# Patient Record
Sex: Female | Born: 1960 | Race: White | Hispanic: No | Smoking: Current every day smoker
Health system: Southern US, Community
[De-identification: ages and names within clinical notes are randomized; demographics above are authoritative.]

## PROBLEM LIST (undated history)

## (undated) DIAGNOSIS — G709 Myoneural disorder, unspecified: Secondary | ICD-10-CM

## (undated) DIAGNOSIS — M1990 Unspecified osteoarthritis, unspecified site: Secondary | ICD-10-CM

## (undated) DIAGNOSIS — J189 Pneumonia, unspecified organism: Secondary | ICD-10-CM

## (undated) DIAGNOSIS — E039 Hypothyroidism, unspecified: Secondary | ICD-10-CM

## (undated) DIAGNOSIS — Z720 Tobacco use: Secondary | ICD-10-CM

## (undated) DIAGNOSIS — G629 Polyneuropathy, unspecified: Secondary | ICD-10-CM

## (undated) DIAGNOSIS — G510 Bell's palsy: Secondary | ICD-10-CM

## (undated) DIAGNOSIS — E785 Hyperlipidemia, unspecified: Secondary | ICD-10-CM

## (undated) DIAGNOSIS — I499 Cardiac arrhythmia, unspecified: Secondary | ICD-10-CM

## (undated) DIAGNOSIS — E669 Obesity, unspecified: Secondary | ICD-10-CM

## (undated) DIAGNOSIS — R232 Flushing: Secondary | ICD-10-CM

## (undated) DIAGNOSIS — Z95810 Presence of automatic (implantable) cardiac defibrillator: Secondary | ICD-10-CM

## (undated) DIAGNOSIS — K219 Gastro-esophageal reflux disease without esophagitis: Secondary | ICD-10-CM

## (undated) DIAGNOSIS — R0602 Shortness of breath: Secondary | ICD-10-CM

## (undated) DIAGNOSIS — I5022 Chronic systolic (congestive) heart failure: Secondary | ICD-10-CM

## (undated) DIAGNOSIS — I639 Cerebral infarction, unspecified: Secondary | ICD-10-CM

## (undated) DIAGNOSIS — I509 Heart failure, unspecified: Secondary | ICD-10-CM

## (undated) DIAGNOSIS — F419 Anxiety disorder, unspecified: Secondary | ICD-10-CM

## (undated) DIAGNOSIS — R51 Headache: Secondary | ICD-10-CM

## (undated) DIAGNOSIS — J449 Chronic obstructive pulmonary disease, unspecified: Secondary | ICD-10-CM

## (undated) DIAGNOSIS — I10 Essential (primary) hypertension: Secondary | ICD-10-CM

## (undated) DIAGNOSIS — R569 Unspecified convulsions: Secondary | ICD-10-CM

## (undated) HISTORY — DX: Chronic systolic (congestive) heart failure: I50.22

## (undated) HISTORY — DX: Flushing: R23.2

## (undated) HISTORY — DX: Gastro-esophageal reflux disease without esophagitis: K21.9

## (undated) HISTORY — PX: TUBAL LIGATION: SHX77

## (undated) HISTORY — PX: EP IMPLANTABLE DEVICE: SHX172B

## (undated) HISTORY — DX: Obesity, unspecified: E66.9

## (undated) HISTORY — DX: Hyperlipidemia, unspecified: E78.5

## (undated) HISTORY — DX: Tobacco use: Z72.0

---

## 1997-12-12 ENCOUNTER — Emergency Department (HOSPITAL_COMMUNITY): Admission: EM | Admit: 1997-12-12 | Discharge: 1997-12-12 | Payer: Self-pay | Admitting: Emergency Medicine

## 1998-02-27 ENCOUNTER — Ambulatory Visit (HOSPITAL_COMMUNITY): Admission: RE | Admit: 1998-02-27 | Discharge: 1998-02-27 | Payer: Self-pay | Admitting: Cardiovascular Disease

## 1998-03-16 ENCOUNTER — Emergency Department (HOSPITAL_COMMUNITY): Admission: EM | Admit: 1998-03-16 | Discharge: 1998-03-16 | Payer: Self-pay | Admitting: Emergency Medicine

## 1998-03-20 ENCOUNTER — Ambulatory Visit (HOSPITAL_COMMUNITY): Admission: RE | Admit: 1998-03-20 | Discharge: 1998-03-20 | Payer: Self-pay | Admitting: Cardiovascular Disease

## 1998-08-30 ENCOUNTER — Emergency Department (HOSPITAL_COMMUNITY): Admission: EM | Admit: 1998-08-30 | Discharge: 1998-08-30 | Payer: Self-pay | Admitting: *Deleted

## 1999-04-02 ENCOUNTER — Emergency Department (HOSPITAL_COMMUNITY): Admission: EM | Admit: 1999-04-02 | Discharge: 1999-04-02 | Payer: Self-pay | Admitting: Emergency Medicine

## 1999-07-30 ENCOUNTER — Emergency Department (HOSPITAL_COMMUNITY): Admission: EM | Admit: 1999-07-30 | Discharge: 1999-07-30 | Payer: Self-pay | Admitting: Emergency Medicine

## 1999-11-04 ENCOUNTER — Emergency Department (HOSPITAL_COMMUNITY): Admission: EM | Admit: 1999-11-04 | Discharge: 1999-11-04 | Payer: Self-pay | Admitting: Emergency Medicine

## 2000-05-23 ENCOUNTER — Emergency Department (HOSPITAL_COMMUNITY): Admission: EM | Admit: 2000-05-23 | Discharge: 2000-05-24 | Payer: Self-pay | Admitting: Emergency Medicine

## 2000-05-23 ENCOUNTER — Encounter: Payer: Self-pay | Admitting: *Deleted

## 2000-06-28 ENCOUNTER — Inpatient Hospital Stay (HOSPITAL_COMMUNITY): Admission: AD | Admit: 2000-06-28 | Discharge: 2000-06-28 | Payer: Self-pay | Admitting: *Deleted

## 2000-08-23 ENCOUNTER — Encounter: Payer: Self-pay | Admitting: Gastroenterology

## 2000-08-23 ENCOUNTER — Encounter: Admission: RE | Admit: 2000-08-23 | Discharge: 2000-08-23 | Payer: Self-pay | Admitting: Gastroenterology

## 2000-11-09 ENCOUNTER — Encounter: Payer: Self-pay | Admitting: Family Medicine

## 2000-11-09 ENCOUNTER — Ambulatory Visit (HOSPITAL_COMMUNITY): Admission: RE | Admit: 2000-11-09 | Discharge: 2000-11-09 | Payer: Self-pay | Admitting: Family Medicine

## 2001-05-04 ENCOUNTER — Ambulatory Visit (HOSPITAL_COMMUNITY): Admission: RE | Admit: 2001-05-04 | Discharge: 2001-05-04 | Payer: Self-pay | Admitting: Internal Medicine

## 2001-05-16 ENCOUNTER — Emergency Department (HOSPITAL_COMMUNITY): Admission: EM | Admit: 2001-05-16 | Discharge: 2001-05-16 | Payer: Self-pay | Admitting: Emergency Medicine

## 2001-05-16 ENCOUNTER — Encounter: Payer: Self-pay | Admitting: Emergency Medicine

## 2001-06-05 ENCOUNTER — Ambulatory Visit (HOSPITAL_COMMUNITY): Admission: RE | Admit: 2001-06-05 | Discharge: 2001-06-05 | Payer: Self-pay | Admitting: *Deleted

## 2001-06-05 ENCOUNTER — Encounter: Payer: Self-pay | Admitting: *Deleted

## 2001-07-07 ENCOUNTER — Emergency Department (HOSPITAL_COMMUNITY): Admission: EM | Admit: 2001-07-07 | Discharge: 2001-07-08 | Payer: Self-pay | Admitting: *Deleted

## 2001-07-20 ENCOUNTER — Ambulatory Visit (HOSPITAL_COMMUNITY): Admission: RE | Admit: 2001-07-20 | Discharge: 2001-07-20 | Payer: Self-pay | Admitting: Internal Medicine

## 2001-07-20 ENCOUNTER — Encounter: Payer: Self-pay | Admitting: Internal Medicine

## 2002-01-14 ENCOUNTER — Encounter: Payer: Self-pay | Admitting: Internal Medicine

## 2002-01-14 ENCOUNTER — Ambulatory Visit (HOSPITAL_COMMUNITY): Admission: RE | Admit: 2002-01-14 | Discharge: 2002-01-14 | Payer: Self-pay | Admitting: Internal Medicine

## 2002-08-09 ENCOUNTER — Encounter: Payer: Self-pay | Admitting: Internal Medicine

## 2002-08-09 ENCOUNTER — Ambulatory Visit (HOSPITAL_COMMUNITY): Admission: RE | Admit: 2002-08-09 | Discharge: 2002-08-09 | Payer: Self-pay | Admitting: Internal Medicine

## 2003-05-02 ENCOUNTER — Ambulatory Visit (HOSPITAL_COMMUNITY): Admission: RE | Admit: 2003-05-02 | Discharge: 2003-05-02 | Payer: Self-pay | Admitting: Family Medicine

## 2004-01-08 ENCOUNTER — Emergency Department (HOSPITAL_COMMUNITY): Admission: EM | Admit: 2004-01-08 | Discharge: 2004-01-08 | Payer: Self-pay | Admitting: Emergency Medicine

## 2004-01-20 ENCOUNTER — Encounter (HOSPITAL_COMMUNITY): Admission: RE | Admit: 2004-01-20 | Discharge: 2004-01-21 | Payer: Self-pay | Admitting: Internal Medicine

## 2004-06-13 ENCOUNTER — Emergency Department (HOSPITAL_COMMUNITY): Admission: EM | Admit: 2004-06-13 | Discharge: 2004-06-13 | Payer: Self-pay | Admitting: Emergency Medicine

## 2004-10-20 ENCOUNTER — Ambulatory Visit (HOSPITAL_COMMUNITY): Admission: RE | Admit: 2004-10-20 | Discharge: 2004-10-20 | Payer: Self-pay | Admitting: Internal Medicine

## 2004-12-18 ENCOUNTER — Emergency Department (HOSPITAL_COMMUNITY): Admission: EM | Admit: 2004-12-18 | Discharge: 2004-12-18 | Payer: Self-pay | Admitting: Emergency Medicine

## 2005-07-13 ENCOUNTER — Encounter: Payer: Self-pay | Admitting: Cardiovascular Disease

## 2005-07-13 ENCOUNTER — Encounter: Payer: Self-pay | Admitting: Emergency Medicine

## 2005-09-26 ENCOUNTER — Emergency Department (HOSPITAL_COMMUNITY): Admission: EM | Admit: 2005-09-26 | Discharge: 2005-09-26 | Payer: Self-pay | Admitting: Emergency Medicine

## 2006-09-05 ENCOUNTER — Emergency Department (HOSPITAL_COMMUNITY): Admission: EM | Admit: 2006-09-05 | Discharge: 2006-09-05 | Payer: Self-pay | Admitting: Emergency Medicine

## 2007-03-23 ENCOUNTER — Ambulatory Visit (HOSPITAL_COMMUNITY): Admission: RE | Admit: 2007-03-23 | Discharge: 2007-03-23 | Payer: Self-pay | Admitting: Internal Medicine

## 2008-03-24 ENCOUNTER — Other Ambulatory Visit: Admission: RE | Admit: 2008-03-24 | Discharge: 2008-03-24 | Payer: Self-pay | Admitting: Obstetrics and Gynecology

## 2008-04-21 ENCOUNTER — Ambulatory Visit (HOSPITAL_COMMUNITY): Admission: RE | Admit: 2008-04-21 | Discharge: 2008-04-21 | Payer: Self-pay | Admitting: Internal Medicine

## 2009-09-24 ENCOUNTER — Ambulatory Visit (HOSPITAL_COMMUNITY): Admission: RE | Admit: 2009-09-24 | Discharge: 2009-09-24 | Payer: Self-pay | Admitting: Family Medicine

## 2010-04-04 ENCOUNTER — Encounter: Payer: Self-pay | Admitting: Internal Medicine

## 2010-05-04 ENCOUNTER — Emergency Department (HOSPITAL_COMMUNITY): Payer: Self-pay

## 2010-05-04 ENCOUNTER — Encounter (HOSPITAL_COMMUNITY): Payer: Self-pay | Admitting: Radiology

## 2010-05-04 ENCOUNTER — Emergency Department (HOSPITAL_COMMUNITY)
Admission: EM | Admit: 2010-05-04 | Discharge: 2010-05-04 | Disposition: A | Discharge status: Home / Self Care | Payer: Self-pay | Attending: Emergency Medicine | Admitting: Emergency Medicine

## 2010-05-04 DIAGNOSIS — J189 Pneumonia, unspecified organism: Secondary | ICD-10-CM | POA: Insufficient documentation

## 2010-05-04 DIAGNOSIS — Z79899 Other long term (current) drug therapy: Secondary | ICD-10-CM | POA: Insufficient documentation

## 2010-05-04 DIAGNOSIS — I10 Essential (primary) hypertension: Secondary | ICD-10-CM | POA: Insufficient documentation

## 2010-05-04 DIAGNOSIS — R05 Cough: Secondary | ICD-10-CM | POA: Insufficient documentation

## 2010-05-04 DIAGNOSIS — E7800 Pure hypercholesterolemia, unspecified: Secondary | ICD-10-CM | POA: Insufficient documentation

## 2010-05-04 DIAGNOSIS — R059 Cough, unspecified: Secondary | ICD-10-CM | POA: Insufficient documentation

## 2010-05-04 HISTORY — DX: Essential (primary) hypertension: I10

## 2010-05-04 LAB — DIFFERENTIAL
Basophils Absolute: 0.1 10*3/uL (ref 0.0–0.1)
Basophils Relative: 1 % (ref 0–1)
Eosinophils Absolute: 0.2 10*3/uL (ref 0.0–0.7)
Eosinophils Relative: 2 % (ref 0–5)
Lymphocytes Relative: 10 % — ABNORMAL LOW (ref 12–46)
Lymphs Abs: 1.1 10*3/uL (ref 0.7–4.0)
Monocytes Absolute: 0.6 10*3/uL (ref 0.1–1.0)
Monocytes Relative: 6 % (ref 3–12)
Neutro Abs: 8.8 10*3/uL — ABNORMAL HIGH (ref 1.7–7.7)
Neutrophils Relative %: 82 % — ABNORMAL HIGH (ref 43–77)

## 2010-05-04 LAB — CBC
HCT: 37.3 % (ref 36.0–46.0)
Hemoglobin: 12 g/dL (ref 12.0–15.0)
MCH: 29.3 pg (ref 26.0–34.0)
MCHC: 32.2 g/dL (ref 30.0–36.0)
MCV: 91.2 fL (ref 78.0–100.0)
Platelets: 251 10*3/uL (ref 150–400)
RBC: 4.09 MIL/uL (ref 3.87–5.11)
RDW: 14.9 % (ref 11.5–15.5)
WBC: 10.7 10*3/uL — ABNORMAL HIGH (ref 4.0–10.5)

## 2010-05-04 LAB — D-DIMER, QUANTITATIVE: D-Dimer, Quant: 0.6 ug/mL-FEU — ABNORMAL HIGH (ref 0.00–0.48)

## 2010-05-04 LAB — BASIC METABOLIC PANEL
BUN: 18 mg/dL (ref 6–23)
CO2: 24 mEq/L (ref 19–32)
Calcium: 9.3 mg/dL (ref 8.4–10.5)
Chloride: 106 mEq/L (ref 96–112)
Creatinine, Ser: 0.7 mg/dL (ref 0.4–1.2)
GFR calc Af Amer: >60 mL/min (ref >60)
GFR calc non Af Amer: >60 mL/min (ref >60)
Glucose, Bld: 119 mg/dL — ABNORMAL HIGH (ref 70–99)
Potassium: 4.3 mEq/L (ref 3.5–5.1)
Sodium: 139 mEq/L (ref 135–145)

## 2010-05-04 LAB — BRAIN NATRIURETIC PEPTIDE: Pro B Natriuretic peptide (BNP): 176 pg/mL — ABNORMAL HIGH (ref 0.0–100.0)

## 2010-05-04 MED ORDER — TECHNETIUM TO 99M ALBUMIN AGGREGATED
6.0000 | Freq: Once | INTRAVENOUS | Status: AC | PRN
  Administered 2010-05-04: 5.2 via INTRAVENOUS

## 2010-07-30 NOTE — Procedures (Signed)
   NAME:  Melanie Cordova, Melanie Cordova                        ACCOUNT NO.:  [id number]   MEDICAL RECORD NO.:  [id number]                    PATIENT TYPE:  POUT   LOCATION:                                       FACILITY:   PHYSICIAN:  Kofi A. Gerilyn Pilgrim, M.D.              DATE OF BIRTH:   DATE OF PROCEDURE:  08/09/2002  DATE OF DISCHARGE:                                EEG INTERPRETATION   The patient is a 50 year old who is suspected of having seizures.   ANALYSIS:  This is a 16-channel recording.  It is conducted for  approximately 20 minutes.  There is a posterior rhythm in the beta range of  14-to-14-1/2 Hz bilaterally.  The recording indicates low electrocortical  voltage throughout.  There is higher Beta activity seen in the frontal  areas.   Awake and sleep activities were seen with K complexes and spindles  indicating stage 2 sleep.  There was no change in the background with photic  stimulation.  There is no focal slowing, lateralized slowing, or  epileptiform activity.   IMPRESSION:  This is a normal recording of the awake and sleep states.                                               Kofi A. Gerilyn Pilgrim, M.D.    KAD/MEDQ  D:  08/10/2002  T:  08/10/2002  Job:  [id number]

## 2010-08-13 DIAGNOSIS — I429 Cardiomyopathy, unspecified: Secondary | ICD-10-CM | POA: Insufficient documentation

## 2010-08-31 ENCOUNTER — Emergency Department (HOSPITAL_COMMUNITY): Payer: Self-pay

## 2010-08-31 ENCOUNTER — Emergency Department (HOSPITAL_COMMUNITY)
Admission: EM | Admit: 2010-08-31 | Discharge: 2010-08-31 | Disposition: A | Discharge status: Home / Self Care | Payer: Self-pay | Attending: Emergency Medicine | Admitting: Emergency Medicine

## 2010-08-31 DIAGNOSIS — E039 Hypothyroidism, unspecified: Secondary | ICD-10-CM | POA: Insufficient documentation

## 2010-08-31 DIAGNOSIS — K589 Irritable bowel syndrome without diarrhea: Secondary | ICD-10-CM | POA: Insufficient documentation

## 2010-08-31 DIAGNOSIS — I10 Essential (primary) hypertension: Secondary | ICD-10-CM | POA: Insufficient documentation

## 2010-08-31 DIAGNOSIS — K7460 Unspecified cirrhosis of liver: Secondary | ICD-10-CM | POA: Insufficient documentation

## 2010-08-31 DIAGNOSIS — Z79899 Other long term (current) drug therapy: Secondary | ICD-10-CM | POA: Insufficient documentation

## 2010-08-31 DIAGNOSIS — E7800 Pure hypercholesterolemia, unspecified: Secondary | ICD-10-CM | POA: Insufficient documentation

## 2010-08-31 DIAGNOSIS — J40 Bronchitis, not specified as acute or chronic: Secondary | ICD-10-CM | POA: Insufficient documentation

## 2010-08-31 DIAGNOSIS — G2581 Restless legs syndrome: Secondary | ICD-10-CM | POA: Insufficient documentation

## 2010-08-31 DIAGNOSIS — G40802 Other epilepsy, not intractable, without status epilepticus: Secondary | ICD-10-CM | POA: Insufficient documentation

## 2010-08-31 DIAGNOSIS — L408 Other psoriasis: Secondary | ICD-10-CM | POA: Insufficient documentation

## 2010-08-31 LAB — CBC
HCT: 37.6 % (ref 36.0–46.0)
Hemoglobin: 12 g/dL (ref 12.0–15.0)
MCH: 28 pg (ref 26.0–34.0)
MCHC: 31.9 g/dL (ref 30.0–36.0)
MCV: 87.9 fL (ref 78.0–100.0)
Platelets: 250 10*3/uL (ref 150–400)
RBC: 4.28 MIL/uL (ref 3.87–5.11)
RDW: 15.5 % (ref 11.5–15.5)
WBC: 11.2 10*3/uL — ABNORMAL HIGH (ref 4.0–10.5)

## 2010-08-31 LAB — DIFFERENTIAL
Basophils Absolute: 0.1 10*3/uL (ref 0.0–0.1)
Basophils Relative: 1 % (ref 0–1)
Eosinophils Absolute: 0.2 10*3/uL (ref 0.0–0.7)
Eosinophils Relative: 2 % (ref 0–5)
Lymphocytes Relative: 13 % (ref 12–46)
Lymphs Abs: 1.5 10*3/uL (ref 0.7–4.0)
Monocytes Absolute: 1 10*3/uL (ref 0.1–1.0)
Monocytes Relative: 9 % (ref 3–12)
Neutro Abs: 8.4 10*3/uL — ABNORMAL HIGH (ref 1.7–7.7)
Neutrophils Relative %: 75 % (ref 43–77)

## 2010-08-31 LAB — COMPREHENSIVE METABOLIC PANEL
ALT: 44 U/L — ABNORMAL HIGH (ref 0–35)
AST: 27 U/L (ref 0–37)
Albumin: 3.3 g/dL — ABNORMAL LOW (ref 3.5–5.2)
Alkaline Phosphatase: 86 U/L (ref 39–117)
BUN: 21 mg/dL (ref 6–23)
CO2: 28 mEq/L (ref 19–32)
Calcium: 9.7 mg/dL (ref 8.4–10.5)
Chloride: 103 mEq/L (ref 96–112)
Creatinine, Ser: 1.05 mg/dL (ref 0.50–1.10)
GFR calc Af Amer: >60 mL/min (ref >60)
GFR calc non Af Amer: 56 mL/min — ABNORMAL LOW (ref >60)
Glucose, Bld: 108 mg/dL — ABNORMAL HIGH (ref 70–99)
Potassium: 4.2 mEq/L (ref 3.5–5.1)
Sodium: 138 mEq/L (ref 135–145)
Total Bilirubin: 0.7 mg/dL (ref 0.3–1.2)
Total Protein: 6.7 g/dL (ref 6.0–8.3)

## 2010-09-23 ENCOUNTER — Emergency Department (HOSPITAL_COMMUNITY): Payer: Self-pay

## 2010-09-23 ENCOUNTER — Encounter (HOSPITAL_COMMUNITY): Payer: Self-pay

## 2010-09-23 ENCOUNTER — Other Ambulatory Visit: Payer: Self-pay

## 2010-09-23 ENCOUNTER — Inpatient Hospital Stay (HOSPITAL_COMMUNITY)
Admission: EM | Admit: 2010-09-23 | Discharge: 2010-09-30 | DRG: 293 | Disposition: A | Discharge status: Home / Self Care | Payer: Self-pay | Attending: Internal Medicine | Admitting: Internal Medicine

## 2010-09-23 DIAGNOSIS — E7800 Pure hypercholesterolemia, unspecified: Secondary | ICD-10-CM | POA: Diagnosis present

## 2010-09-23 DIAGNOSIS — G479 Sleep disorder, unspecified: Secondary | ICD-10-CM | POA: Diagnosis present

## 2010-09-23 DIAGNOSIS — J45909 Unspecified asthma, uncomplicated: Secondary | ICD-10-CM | POA: Diagnosis present

## 2010-09-23 DIAGNOSIS — I2589 Other forms of chronic ischemic heart disease: Secondary | ICD-10-CM | POA: Diagnosis present

## 2010-09-23 DIAGNOSIS — K219 Gastro-esophageal reflux disease without esophagitis: Secondary | ICD-10-CM | POA: Diagnosis present

## 2010-09-23 DIAGNOSIS — E039 Hypothyroidism, unspecified: Secondary | ICD-10-CM | POA: Diagnosis present

## 2010-09-23 DIAGNOSIS — E876 Hypokalemia: Secondary | ICD-10-CM | POA: Diagnosis not present

## 2010-09-23 DIAGNOSIS — I5021 Acute systolic (congestive) heart failure: Principal | ICD-10-CM | POA: Diagnosis present

## 2010-09-23 DIAGNOSIS — I10 Essential (primary) hypertension: Secondary | ICD-10-CM | POA: Diagnosis present

## 2010-09-23 DIAGNOSIS — E669 Obesity, unspecified: Secondary | ICD-10-CM | POA: Diagnosis present

## 2010-09-23 DIAGNOSIS — I509 Heart failure, unspecified: Secondary | ICD-10-CM | POA: Diagnosis present

## 2010-09-23 HISTORY — DX: Hypothyroidism, unspecified: E03.9

## 2010-09-23 HISTORY — DX: Pneumonia, unspecified organism: J18.9

## 2010-09-23 HISTORY — DX: Shortness of breath: R06.02

## 2010-09-23 LAB — CBC
Hemoglobin: 11.9 g/dL — ABNORMAL LOW (ref 12.0–15.0)
MCHC: 31.2 g/dL (ref 30.0–36.0)
RDW: 15.6 % — ABNORMAL HIGH (ref 11.5–15.5)

## 2010-09-23 LAB — CARDIAC PANEL(CRET KIN+CKTOT+MB+TROPI)
CK, MB: 3 ng/mL (ref 0.3–4.0)
Relative Index: INVALID (ref 0.0–2.5)
Total CK: 74 U/L (ref 7–177)
Troponin I: 0.31 ng/mL

## 2010-09-23 LAB — BASIC METABOLIC PANEL
Chloride: 104 mEq/L (ref 96–112)
GFR calc Af Amer: >60 mL/min (ref >60)
Potassium: 4 mEq/L (ref 3.5–5.1)

## 2010-09-23 LAB — DIFFERENTIAL
Basophils Absolute: 0.1 10*3/uL (ref 0.0–0.1)
Basophils Relative: 1 % (ref 0–1)
Neutro Abs: 6 10*3/uL (ref 1.7–7.7)
Neutrophils Relative %: 71 % (ref 43–77)

## 2010-09-23 LAB — PRO B NATRIURETIC PEPTIDE: Pro B Natriuretic peptide (BNP): 1666 pg/mL — ABNORMAL HIGH (ref 0–125)

## 2010-09-23 MED ORDER — SODIUM CHLORIDE 0.9 % IJ SOLN
3.0000 mL | INTRAMUSCULAR | Status: DC | PRN
  Administered 2010-09-25: 10 mL via INTRAVENOUS
  Administered 2010-09-26 – 2010-09-28 (×2): 3 mL via INTRAVENOUS
  Filled 2010-09-23 (×4): qty 3

## 2010-09-23 MED ORDER — FUROSEMIDE 10 MG/ML IJ SOLN
40.0000 mg | Freq: Once | INTRAMUSCULAR | Status: AC
  Administered 2010-09-23: 40 mg via INTRAVENOUS
  Filled 2010-09-23: qty 4

## 2010-09-23 MED ORDER — SODIUM CHLORIDE 0.9 % IV SOLN: 250.0000 mL | INTRAVENOUS | Status: DC

## 2010-09-23 MED ORDER — ENOXAPARIN SODIUM 40 MG/0.4ML ~~LOC~~ SOLN
40.0000 mg | SUBCUTANEOUS | Status: DC
  Administered 2010-09-23 – 2010-09-29 (×7): 40 mg via SUBCUTANEOUS
  Filled 2010-09-23 (×7): qty 0.4

## 2010-09-23 MED ORDER — PANTOPRAZOLE SODIUM 40 MG IV SOLR
40.0000 mg | INTRAVENOUS | Status: DC
  Administered 2010-09-23 – 2010-09-24 (×2): 40 mg via INTRAVENOUS
  Filled 2010-09-23 (×2): qty 40

## 2010-09-23 MED ORDER — CARVEDILOL 12.5 MG PO TABS
12.5000 mg | ORAL_TABLET | Freq: Two times a day (BID) | ORAL | Status: DC
  Administered 2010-09-23 – 2010-09-24 (×2): 12.5 mg via ORAL
  Filled 2010-09-23 (×2): qty 1

## 2010-09-23 MED ORDER — SIMVASTATIN 20 MG PO TABS
20.0000 mg | ORAL_TABLET | Freq: Every day | ORAL | Status: DC
  Administered 2010-09-23 – 2010-09-30 (×8): 20 mg via ORAL
  Filled 2010-09-23 (×8): qty 1

## 2010-09-23 MED ORDER — POTASSIUM CHLORIDE CRYS ER 10 MEQ PO TBCR
10.0000 meq | EXTENDED_RELEASE_TABLET | Freq: Two times a day (BID) | ORAL | Status: DC
  Administered 2010-09-23 – 2010-09-26 (×6): 10 meq via ORAL
  Filled 2010-09-23 (×5): qty 1

## 2010-09-23 MED ORDER — OLMESARTAN MEDOXOMIL 20 MG PO TABS
20.0000 mg | ORAL_TABLET | Freq: Every day | ORAL | Status: DC
  Administered 2010-09-23: 20 mg via ORAL
  Filled 2010-09-23 (×2): qty 1

## 2010-09-23 MED ORDER — LEVOTHYROXINE SODIUM 112 MCG PO TABS
336.0000 ug | ORAL_TABLET | Freq: Every day | ORAL | Status: DC
  Administered 2010-09-24 – 2010-09-30 (×7): 336 ug via ORAL
  Filled 2010-09-23 (×9): qty 3

## 2010-09-23 MED ORDER — FUROSEMIDE 10 MG/ML IJ SOLN
40.0000 mg | Freq: Two times a day (BID) | INTRAMUSCULAR | Status: DC
  Administered 2010-09-23 – 2010-09-24 (×2): 40 mg via INTRAVENOUS
  Filled 2010-09-23 (×2): qty 4

## 2010-09-23 MED ORDER — SODIUM CHLORIDE 0.9 % IJ SOLN
3.0000 mL | Freq: Two times a day (BID) | INTRAMUSCULAR | Status: DC
  Administered 2010-09-24 – 2010-09-30 (×12): 3 mL via INTRAVENOUS
  Filled 2010-09-23 (×13): qty 3

## 2010-09-23 MED ORDER — LORATADINE 10 MG PO TABS
10.0000 mg | ORAL_TABLET | Freq: Every day | ORAL | Status: DC
  Administered 2010-09-23 – 2010-09-30 (×8): 10 mg via ORAL
  Filled 2010-09-23 (×8): qty 1

## 2010-09-23 NOTE — ED Notes (Signed)
Pt waiting to be eval by edp

## 2010-09-23 NOTE — ED Notes (Signed)
Attempted to call report, floor nurse will return call.

## 2010-09-23 NOTE — ED Notes (Signed)
Pt sitting up in chair, stated easier for her to breath, on cell phone talking w. Family.  Pt advocate at bedside.  Iv started and meds given

## 2010-09-23 NOTE — ED Notes (Signed)
Pt states her feet have been swelling for a while. Also, has been sob for a while including mult med problems. Also states she has had all of her meds changed from generic to brand

## 2010-09-23 NOTE — H&P (Signed)
  [id number] 

## 2010-09-23 NOTE — ED Provider Notes (Signed)
History     Chief Complaint  Patient presents with  . Shortness of Breath   HPI Comments: Pt with sob with lying down  Patient is a 50 y.o. female presenting with shortness of breath. The history is provided by the patient.  Shortness of Breath  The current episode started more than 2 weeks ago. The problem occurs frequently. The problem has been unchanged. The problem is moderate. The symptoms are relieved by nothing. Associated symptoms include shortness of breath. Pertinent negatives include no chest pain, no chest pressure, no fever, no sore throat, no stridor and no cough. She was not exposed to toxic fumes. She has had no prior steroid use. Her past medical history does not include past wheezing or eczema.    Past Medical History  Diagnosis Date  . Hypertension   . Thyroid disease   . Asthma   . Acid reflux     History reviewed. No pertinent past surgical history.  No family history on file.  History  Substance Use Topics  . Smoking status: Former Smoker -- 1.0 packs/day    Quit date: 09/03/2010  . Smokeless tobacco: Not on file  . Alcohol Use:     OB History    Grav Para Term Preterm Abortions TAB SAB Ect Mult Living                  Review of Systems  Constitutional: Negative for fever and fatigue.  HENT: Negative for congestion, sore throat, sinus pressure and ear discharge.   Eyes: Negative for discharge.  Respiratory: Positive for shortness of breath. Negative for cough and stridor.        Dyspnea with lying down  Cardiovascular: Negative for chest pain.  Gastrointestinal: Negative for abdominal pain and diarrhea.  Genitourinary: Negative for frequency and hematuria.  Musculoskeletal: Negative for back pain.  Skin: Negative for rash.  Neurological: Negative for seizures and headaches.  Hematological: Negative.   Psychiatric/Behavioral: Negative for hallucinations.    Physical Exam  BP 130/73  Pulse 88  Temp(Src) 98.3 F (36.8 C) (Oral)  Resp 16   SpO2 96%  Physical Exam  Constitutional: She is oriented to person, place, and time. She appears well-developed.  HENT:  Head: Normocephalic and atraumatic.  Eyes: Conjunctivae and EOM are normal. No scleral icterus.  Neck: Neck supple. No thyromegaly present.  Cardiovascular: Normal rate and regular rhythm.  Exam reveals no gallop and no friction rub.   No murmur heard. Pulmonary/Chest: No stridor. She is in respiratory distress. She has no wheezes. She has no rales. She exhibits no tenderness.  Abdominal: She exhibits distension. There is tenderness. There is no rebound.       Swelling to lower abd  Musculoskeletal: Normal range of motion. She exhibits edema.  Lymphadenopathy:    She has no cervical adenopathy.  Neurological: She is oriented to person, place, and time. Coordination normal.  Skin: No rash noted. No erythema.  Psychiatric: She has a normal mood and affect. Her behavior is normal.    ED Course  Procedures  Orders placed during the hospital encounter of 09/23/10  . ED EKG  . ED EKG   Date: 09/23/2010  Rate: 111  Rhythm: normal sinus rhythm   With pvc  QRS Axis: normal  Intervals: normal  ST/T Wave abnormalities: nonspecific ST changes  Conduction Disutrbances:none  Narrative Interpretation:   Old EKG Reviewed: none available  MDM chf      Benny Lennert, MD 09/23/10 [phone]

## 2010-09-23 NOTE — ED Notes (Signed)
Report to jerry rn on 3oo.

## 2010-09-23 NOTE — ED Notes (Signed)
Pt states she has had a recent change to all meds. States she has been sob and feet swelling since

## 2010-09-23 NOTE — H&P (Signed)
NAMELAZETTE, ESTALA            ACCOUNT NO.:  [id number]  MEDICAL RECORD NO.:  [id number]  LOCATION:  A334                          FACILITY:  [facility]  PHYSICIAN:  Talmage Nap, MD  DATE OF BIRTH:  1960/03/29  DATE OF ADMISSION:  09/23/2010 DATE OF DISCHARGE:  LH                             HISTORY & PHYSICAL   PRIMARY CARE PHYSICIAN:  Unassigned.  History obtainable from the patient.  CHIEF COMPLAINT:  Shortness of breath on and off of 2 days' duration.  The patient is a 50 year old Caucasian female with history of asthma, hypothyroidism, presenting to the emergency room with 2 days history of shortness of breath that was said to be getting progressively worse. The patient claimed that for months she has been short of breath and the shortness of breath has been on and off; however, the present episode started about 2 days prior to presenting to the emergency room. The shortness of breath was said to be intermittent.  She denied any associated chest pain.  She denied any cough.  She denied any fever. She denied any chills.  She denied any rigor.  She claims she gets short of breath walking less than a block.  She also get a positive history of PND and orthopnea and progressive swelling of the lower extremities. She denied any chest pain.  She denied any abdominal discomfort.  No diarrhea or hematochezia.  No dysuria or hematuria.  Has progressive swelling of the lower extremity.  Shortness of breath was said to be getting progressively worse, hence the patient presented to the emergency room to be evaluated.  PAST MEDICAL HISTORY:  Positive for: 1. Hypertension. 2. Hypothyroidism. 3. Asthma. 4. Hyperlipidemia. 5. Allergies.  PAST SURGICAL HISTORY:  Cesarean section x2.  PREADMISSION MEDICATIONS: 1. Atorvastatin 20 mg p.o. daily. 2. Synthroid 112 mcg p.o. daily. 3. Loratadine 10 mg p.o. daily. 4. Ranitidine 150 mg p.o. b.i.d.  ALLERGIES:  SULFA, PENICILLIN and IV  DYE.  SOCIAL HISTORY:  The patient smokes about a pack of cigarettes per day for the past 20 years.  Denies any history of alcohol use or street drug use and she works in [employer].  FAMILY HISTORY:  York Spaniel to be positive for coronary artery disease as well as congestive heart failure.  REVIEW OF SYSTEMS:  The patient denies any history of headaches.  No blurred vision.  No nausea or vomiting.  No fever.  No chills.  No rigor.  Complained of shortness of breath that is getting progressively worse with associated PND and orthopnea.  She denies any abdominal discomfort.  No diarrhea or hematochezia.  No dysuria or hematuria.  Has progressive swelling of the lower extremity.  No intolerance to heat or cold and no neuropsychiatric disorder.  PHYSICAL EXAMINATION:  GENERAL:  Very pleasant lady in mild respiratory distress evident by flaring of ala nasi. PRESENT VITAL SIGNS:  Temperature is 98.3, pulse is 88, respiratory rate is 16, blood pressure is 130/73, saturating 96% on O2 via nasal cannula. HEENT:  Mild pallor, but pupils are reactive to light and extraocular muscles are intact. NECK:  No jugular venous distention.  No carotid bruit.  No lymphadenopathy. CHEST:  Showed rales mid  and lower zones of the lungs bilaterally. HEART:  Sounds are 1 and 2. ABDOMEN:  Obese, nontender.  Liver, spleen, kidney are not palpable. Bowel sounds are positive. EXTREMITIES:  Show +2 pedal edema up to mid tibia. NEUROPSYCHIATRIC:  Evaluation is unremarkable. SKIN:  Showed normal turgor.  LABORATORY DATA:  Chemistry shows sodium of 140, potassium of 4.0, chloride of 104 with bicarb is 27, BUN is 81, creatinine is 0.79, glucose is 108.  LFTs are normal.  Hematological indices showed WBC of 8.5, hemoglobin 11.9, hematocrit of 38.2, MCV 87.0, platelet count of 225.  Normal differential.  Pro-BNP is 1666.  Imaging studies done include chest x-ray which showed pulmonary vascular and  cardiomegaly congestion.  EKG, not seen.  IMPRESSION: 1. Congestive heart failure (not otherwise specified). 2. Hypertension. 3. Hypothyroidism. 4. Hyperlipidemia. 5. Asthma. 6. Allergies.  PLAN:  Plan is to admit the patient to telemetry.  The patient will be on O2 via nasal cannula 2-3 liters per minute.  She will be on Lasix 40 mg IV q.12 followed by KCl 20 mEq p.o. b.i.d.  She also will be on Diovan 160 mg p.o. daily.  The patient will be on Coreg 12.5 mg p.o. b.i.d.  She will be restarted on Zocor for hyperlipidemia, loratadine for allergies and Synthroid for hypothyroidism.  Further lab work to be done on the patient will include cardiac enzymes q.6 x3.  BNP will be repeated in a.m.  Chest x-ray will also be repeated in a.m.  The patient will also have a 2-D echo done.DVT prophylaxis with lovenox and gi prophylaxis with protonix.Patient will have daily weigh and fluid restricton.   She will be followed and evaluated on day-to-day basis.     Talmage Nap, MD    CN/MEDQ  D:  09/23/2010  T:  09/23/2010  Job:  [id number]

## 2010-09-24 ENCOUNTER — Inpatient Hospital Stay (HOSPITAL_COMMUNITY): Payer: Self-pay

## 2010-09-24 DIAGNOSIS — I059 Rheumatic mitral valve disease, unspecified: Secondary | ICD-10-CM

## 2010-09-24 LAB — CARDIAC PANEL(CRET KIN+CKTOT+MB+TROPI)
CK, MB: 2.3 ng/mL (ref 0.3–4.0)
Relative Index: INVALID (ref 0.0–2.5)
Troponin I: <0.3 ng/mL (ref <0.30)

## 2010-09-24 LAB — COMPREHENSIVE METABOLIC PANEL
Alkaline Phosphatase: 91 U/L (ref 39–117)
BUN: 19 mg/dL (ref 6–23)
CO2: 25 mEq/L (ref 19–32)
Chloride: 102 mEq/L (ref 96–112)
GFR calc Af Amer: >60 mL/min (ref >60)
GFR calc non Af Amer: >60 mL/min (ref >60)
Glucose, Bld: 117 mg/dL — ABNORMAL HIGH (ref 70–99)
Potassium: 3.7 mEq/L (ref 3.5–5.1)
Total Bilirubin: 0.8 mg/dL (ref 0.3–1.2)

## 2010-09-24 LAB — MAGNESIUM: Magnesium: 2 mg/dL (ref 1.5–2.5)

## 2010-09-24 MED ORDER — FUROSEMIDE 10 MG/ML IJ SOLN
20.0000 mg | Freq: Three times a day (TID) | INTRAMUSCULAR | Status: AC
  Administered 2010-09-24 – 2010-09-26 (×8): 20 mg via INTRAVENOUS
  Filled 2010-09-24 (×8): qty 2

## 2010-09-24 MED ORDER — ACETAMINOPHEN 325 MG PO TABS
650.0000 mg | ORAL_TABLET | Freq: Four times a day (QID) | ORAL | Status: DC | PRN
  Administered 2010-09-24: 650 mg via ORAL
  Filled 2010-09-24: qty 2

## 2010-09-24 MED ORDER — CARVEDILOL 3.125 MG PO TABS
3.1250 mg | ORAL_TABLET | Freq: Two times a day (BID) | ORAL | Status: DC
  Administered 2010-09-24 – 2010-09-26 (×4): 3.125 mg via ORAL
  Filled 2010-09-24 (×4): qty 1

## 2010-09-24 MED ORDER — DOCUSATE SODIUM 100 MG PO CAPS
100.0000 mg | ORAL_CAPSULE | Freq: Two times a day (BID) | ORAL | Status: DC | PRN
  Administered 2010-09-24: 100 mg via ORAL
  Filled 2010-09-24: qty 1

## 2010-09-24 MED ORDER — HYDROCODONE-ACETAMINOPHEN 5-325 MG PO TABS
1.0000 | ORAL_TABLET | Freq: Four times a day (QID) | ORAL | Status: DC | PRN
  Administered 2010-09-24 – 2010-09-30 (×3): 1 via ORAL
  Filled 2010-09-24 (×3): qty 1

## 2010-09-24 NOTE — Progress Notes (Signed)
Pt complained of constipation, per pt no BM in 3 days, pt is passing flatus. Dr. Beverly Gust notified new orders received for colace.

## 2010-09-24 NOTE — Progress Notes (Addendum)
Subjective: Overall the patient feels a bit better. She remains with orthopnea. She continues to have dyspnea on exertion. The patient reports her symptoms have been going on for more than 6 months. She has had orthopnea and dyspnea on exertion. She's been treated for pneumonia over this last year as well as several episodes of bronchitis. In addition she just recently quit smoking approximately 3 weeks ago. She denies any recent chest pain. She denies any history of heart disease but she notes that both her parents have heart disease.  Objective: Vital signs in last 24 hours: Patient Vitals for the past 24 hrs:  BP Temp Temp src Pulse Resp SpO2 Height Weight  09/24/10 0600 95/70 mmHg 98 F (36.7 C) - 95  20  93 % - -  09/24/10 0205 - - - - 18  96 % - -  09/23/10 2300 - - - - - - 5' 7.5" (1.715 m) 131 kg (288 lb 12.8 oz)  09/23/10 2030 122/87 mmHg 97.8 F (36.6 C) - 113  16  95 % - -  09/23/10 1546 130/73 mmHg 98.3 F (36.8 C) Oral 88  16  96 % - -   No intake or output data in the 24 hours ending 09/24/10 0855 Exam:  Results for orders placed during the hospital encounter of 09/23/10 (from the past 24 hour(s))  CBC     Status: Abnormal   Collection Time   09/23/10  5:36 PM      Component Value Range   WBC 8.5  4.0 - 10.5 (K/uL)   RBC 4.39  3.87 - 5.11 (MIL/uL)   Hemoglobin 11.9 (*) 12.0 - 15.0 (g/dL)   HCT 04.5  40.9 - 81.1 (%)   MCV 87.0  78.0 - 100.0 (fL)   MCH 27.1  26.0 - 34.0 (pg)   MCHC 31.2  30.0 - 36.0 (g/dL)   RDW 91.4 (*) 78.2 - 15.5 (%)   Platelets 225  150 - 400 (K/uL)  DIFFERENTIAL     Status: Normal   Collection Time   09/23/10  5:36 PM      Component Value Range   Neutrophils Relative 71  43 - 77 (%)   Neutro Abs 6.0  1.7 - 7.7 (K/uL)   Lymphocytes Relative 19  12 - 46 (%)   Lymphs Abs 1.6  0.7 - 4.0 (K/uL)   Monocytes Relative 8  3 - 12 (%)   Monocytes Absolute 0.7  0.1 - 1.0 (K/uL)   Eosinophils Relative 2  0 - 5 (%)   Eosinophils Absolute 0.2  0.0 - 0.7  (K/uL)   Basophils Relative 1  0 - 1 (%)   Basophils Absolute 0.1  0.0 - 0.1 (K/uL)  BASIC METABOLIC PANEL     Status: Abnormal   Collection Time   09/23/10  5:36 PM      Component Value Range   Sodium 140  135 - 145 (mEq/L)   Potassium 4.0  3.5 - 5.1 (mEq/L)   Chloride 104  96 - 112 (mEq/L)   CO2 27  19 - 32 (mEq/L)   Glucose, Bld 108 (*) 70 - 99 (mg/dL)   BUN 18  6 - 23 (mg/dL)   Creatinine, Ser 9.56  0.50 - 1.10 (mg/dL)   Calcium 9.1  8.4 - 21.3 (mg/dL)   GFR calc non Af Amer >60  >60 (mL/min)   GFR calc Af Amer >60  >60 (mL/min)  PRO B NATRIURETIC PEPTIDE     Status:  Abnormal   Collection Time   09/23/10  5:36 PM      Component Value Range   BNP, POC 1666.0 (*) 0 - 125 (pg/mL)  CARDIAC PANEL(CRET KIN+CKTOT+MB+TROPI)     Status: Abnormal   Collection Time   09/23/10  5:36 PM      Component Value Range   Total CK 74  7 - 177 (U/L)   CK, MB 3.0  0.3 - 4.0 (ng/mL)   Troponin I 0.31 (*) <0.30 (ng/mL)   Relative Index RELATIVE INDEX IS INVALID  0.0 - 2.5   CARDIAC PANEL(CRET KIN+CKTOT+MB+TROPI)     Status: Normal   Collection Time   09/24/10  1:48 AM      Component Value Range   Total CK 56  7 - 177 (U/L)   CK, MB 2.3  0.3 - 4.0 (ng/mL)   Troponin I <0.30  <0.30 (ng/mL)   Relative Index RELATIVE INDEX IS INVALID  0.0 - 2.5   COMPREHENSIVE METABOLIC PANEL     Status: Abnormal   Collection Time   09/24/10  1:48 AM      Component Value Range   Sodium 138  135 - 145 (mEq/L)   Potassium 3.7  3.5 - 5.1 (mEq/L)   Chloride 102  96 - 112 (mEq/L)   CO2 25  19 - 32 (mEq/L)   Glucose, Bld 117 (*) 70 - 99 (mg/dL)   BUN 19  6 - 23 (mg/dL)   Creatinine, Ser 1.61  0.50 - 1.10 (mg/dL)   Calcium 8.6  8.4 - 09.6 (mg/dL)   Total Protein 6.0  6.0 - 8.3 (g/dL)   Albumin 2.9 (*) 3.5 - 5.2 (g/dL)   AST 31  0 - 37 (U/L)   ALT 35  0 - 35 (U/L)   Alkaline Phosphatase 91  39 - 117 (U/L)   Total Bilirubin 0.8  0.3 - 1.2 (mg/dL)   GFR calc non Af Amer >60  >60 (mL/min)   GFR calc Af Amer >60  >60  (mL/min)  MAGNESIUM     Status: Normal   Collection Time   09/24/10  1:48 AM      Component Value Range   Magnesium 2.0  1.5 - 2.5 (mg/dL)  PRO B NATRIURETIC PEPTIDE     Status: Abnormal   Collection Time   09/24/10  2:40 AM      Component Value Range   BNP, POC 1680.0 (*) 0 - 125 (pg/mL)   Glucose, Bld  Date/Time Value Range Status  09/24/2010  1:48 AM 117* 70-99 (mg/dL) Final  0/45/4098  1:19 PM 108* 70-99 (mg/dL) Final  1/47/8295  6:21 PM 108* 70-99 (mg/dL) Final  05/19/6576  4:69 AM 119* 70-99 (mg/dL) Final   Assessment/Plan: Principal Problem -Congestive heart failure, not otherwise specified. Patient's clinical symptomology suggests heart failure, currently type unknown. Will plan a 2-D echocardiogram. Continue IV Lasix. Continue beta blocker therapy and would start an ACE inhibitor of her ejection fraction is depressed. Note that although the patient has listed sulfa allergy she has tolerated Lasix.  Active Problems -Hypertension. Appears to be stable. -Hypothyroidism. Continue replacement. Will check serum TSH. -Smoker. Recently quit.  Code Status: Full.  Disposition Plan: Anticipate home with home health for heart failure in the next few days.  PCP: [facility]  LOS: 1 day   Melanie Cordova   Data Reviewed:  Studies/Results: Dg Chest 2 View  09/23/2010  *RADIOLOGY REPORT*  Clinical Data: Shortness of breath.  Lower extremity swelling. Generalized weakness  and fatigue.  Former smoker with history of asthma.  CHEST - 2 VIEW 09/23/2010:  Comparison: Two-view chest x-ray 08/31/2010 and 01/08/2004.  Findings: Cardiac silhouette markedly enlarged, with significant increase in size since 2005.  Pulmonary vascularity normal without evidence of pulmonary edema.  Prominent bronchovascular markings diffusely and moderate central peribronchial thickening, unchanged since the examination 08/31/2010.  No confluent airspace consolidation.  Stable linear scarring in the lingula  and right middle lobe.  No pleural effusions.  Visualized bony thorax intact.  IMPRESSION: Cardiomegaly, with significant interval increase in the heart size since 2005.  Stable chronic bronchitis and/or asthma.  No acute cardiopulmonary disease.  Original Report Authenticated By: Arnell Sieving, M.D.   Scheduled Meds:   . carvedilol  12.5 mg Oral BID WC  . enoxaparin  40 mg Subcutaneous Q24H  . furosemide  40 mg Intravenous Once  . furosemide  40 mg Intravenous Q12H  . levothyroxine  336 mcg Oral Daily  . loratadine  10 mg Oral Daily  . olmesartan  20 mg Oral Daily  . pantoprazole (PROTONIX) IV  40 mg Intravenous Q24H  . potassium chloride  10 mEq Oral BID  . simvastatin  20 mg Oral Daily  . sodium chloride  3 mL Intravenous Q12H   Continuous Infusions:   . DISCONTD: sodium chloride

## 2010-09-24 NOTE — Progress Notes (Signed)
Patient complained of some tightness in her chest, but had taken her O2 off.  This was reapplied and her bed was put back and her legs elevated.  She is extremely swollen in her bilateral lower extremities as well as her abdomen and laying back slightly seemed to take some of the pressure off of her diaphragm.  She was very restless and could not sleep.  Something to help her sleep would be helpful.  I did offer her the sound machine which she is trying.  Her lungs sound diminished, but clear.  She feels that her legs are not as tight and the swelling in the right leg is worse than the left.  She has swelling and some redness under her pannus, but there is no broken down skin.  This could possibly be a yeast problem as she states that area stays moist.

## 2010-09-24 NOTE — Progress Notes (Signed)
Extensive teaching was done with the patient and her daughter regarding CHF.  Care notes were given on the management of this chronic disease as well as diet assistance.  Explained about weighing daily and told her that she would be given scales upon discharge.  The patient verbalized understanding of the teaching.

## 2010-09-25 LAB — BASIC METABOLIC PANEL
Chloride: 99 mEq/L (ref 96–112)
Creatinine, Ser: 0.88 mg/dL (ref 0.50–1.10)
GFR calc Af Amer: >60 mL/min (ref >60)
Potassium: 3.7 mEq/L (ref 3.5–5.1)
Sodium: 139 mEq/L (ref 135–145)

## 2010-09-25 LAB — TSH: TSH: 1.403 u[IU]/mL (ref 0.350–4.500)

## 2010-09-25 LAB — LIPID PANEL
HDL: 25 mg/dL — ABNORMAL LOW (ref >39)
LDL Cholesterol: 73 mg/dL (ref 0–99)

## 2010-09-25 MED ORDER — PANTOPRAZOLE SODIUM 40 MG PO TBEC
40.0000 mg | DELAYED_RELEASE_TABLET | Freq: Every day | ORAL | Status: DC
  Administered 2010-09-26 – 2010-09-30 (×5): 40 mg via ORAL
  Filled 2010-09-25 (×5): qty 1

## 2010-09-25 MED ORDER — SIMETHICONE 80 MG PO CHEW
80.0000 mg | CHEWABLE_TABLET | Freq: Four times a day (QID) | ORAL | Status: DC | PRN
  Administered 2010-09-25 – 2010-09-29 (×3): 80 mg via ORAL
  Filled 2010-09-25 (×3): qty 1

## 2010-09-25 MED ORDER — SODIUM CHLORIDE 0.9 % IJ SOLN
INTRAMUSCULAR | Status: AC
  Administered 2010-09-25: 10 mL via INTRAVENOUS
  Filled 2010-09-25: qty 10

## 2010-09-25 NOTE — Progress Notes (Signed)
Protonix changed from IV to po per IV to PO policy Raquel James, Fernado Brigante [place] 09/25/2010

## 2010-09-25 NOTE — Progress Notes (Addendum)
Subjective: Overall, the patient feels better today. She has decreased dyspnea on exertion and shortness of breath. She is ambulating better. She still has r edema, but it has improved. She denies any pain.  The patient notes that her breathing problems have been going on for at least the last 8-10 months. She had several bouts of pneumonia and then more recently, bronchitis. Over the last month or so to the last couple of weeks she has had marked increased shortness of breath. She has had no chest pain.  Objective: Vital signs in last 24 hours: Patient Vitals for the past 24 hrs:  BP Temp Pulse Resp SpO2  09/25/10 0600 98/66 mmHg 97.7 F (36.5 C) 89  20  98 %  09/24/10 2200 114/69 mmHg 98.2 F (36.8 C) 103  20  98 %  09/24/10 2100 - - 98  20  96 %  09/24/10 1500 110/70 mmHg 97.7 F (36.5 C) 75  18  94 %    Intake/Output Summary (Last 24 hours) at 09/25/10 0946 Last data filed at 09/25/10 0900  Gross per 24 hour  Intake   2160 ml  Output    875 ml  Net   1285 ml   Exam: General: No acute distress. Nontoxic. Cardiovascular: Regular rate and rhythm. No murmur, rub, or gallop, decreased lower extremity edema, 2+ bilaterally. Respiratory: Clear to auscultation bilaterally. No wheezes, Rales, or rhonchi. Normal respiratory effort.   Results for orders placed during the hospital encounter of 09/23/10 (from the past 24 hour(s))  BASIC METABOLIC PANEL     Status: Normal   Collection Time   09/25/10  5:31 AM      Component Value Range   Sodium 139  135 - 145 (mEq/L)   Potassium 3.7  3.5 - 5.1 (mEq/L)   Chloride 99  96 - 112 (mEq/L)   CO2 31  19 - 32 (mEq/L)   Glucose, Bld 99  70 - 99 (mg/dL)   BUN 18  6 - 23 (mg/dL)   Creatinine, Ser 0.45  0.50 - 1.10 (mg/dL)   Calcium 8.7  8.4 - 40.9 (mg/dL)   GFR calc non Af Amer >60  >60 (mL/min)   GFR calc Af Amer >60  >60 (mL/min)   Glucose, Bld  Date/Time Value Range Status  09/25/2010  5:31 AM 99  70-99 (mg/dL) Final  10/22/9145  8:29 AM  117* 70-99 (mg/dL) Final  5/62/1308  6:57 PM 108* 70-99 (mg/dL) Final  8/46/9629  5:28 PM 108* 70-99 (mg/dL) Final  06/25/2438  1:02 AM 119* 70-99 (mg/dL) Final    2-D echocardiogram: Left ventricle: Wall thickness was increased in a pattern of moderate LVH. There was mild concentric hypertrophy. Systolic function was severely reduced. The estimated ejection fraction was in the range of 15% to 20%. Severe diffuse hypokinesis.  Assessment/Plan: Principal Problem -Systolic congestive heart failure, new diagnosis. Patient has global hypokinesis. Given her history of several bouts of pneumonia and bronchitis. 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 Clinically, she is improved and is ambulating better with decreased shortness of breath. Will continue IV Lasix, beta blocker therapy and will add a low-dose ACE inhibitor if her blood pressure can tolerate this. She will need a cardiology referral and consideration for heart catheterization in the next few weeks. By her history it is unclear how long this cardiomyopathy has been present, for she has had symptoms for 7-8 months or longer. Recent worsening, would suggest perhaps a worsening of her cardiomyopathy in the last month  or so.  Active Problems -Hypertension. Appears to be stable. -Hypothyroidism. Continue replacement. Serum TSH is pending. -Smoker. Recently quit.  Code Status: Full.  Disposition Plan: Anticipate home with home health for heart failure in the next few days.  [facility] [place].  LOS: 2 days   Klair Leising PAUL   Data Reviewed:  Scheduled Meds:    . carvedilol  3.125 mg Oral BID WC  . enoxaparin  40 mg Subcutaneous Q24H  . furosemide  20 mg Intravenous Q8H  . levothyroxine  336 mcg Oral Daily  . loratadine  10 mg Oral Daily  . pantoprazole (PROTONIX) IV  40 mg Intravenous Q24H  . potassium chloride  10 mEq Oral BID  . simvastatin  20 mg Oral Daily  . sodium chloride  3 mL  Intravenous Q12H

## 2010-09-26 LAB — BASIC METABOLIC PANEL
BUN: 15 mg/dL (ref 6–23)
CO2: 29 mEq/L (ref 19–32)
Calcium: 8.9 mg/dL (ref 8.4–10.5)
Chloride: 98 mEq/L (ref 96–112)
Creatinine, Ser: 0.71 mg/dL (ref 0.50–1.10)

## 2010-09-26 MED ORDER — POTASSIUM CHLORIDE CRYS ER 20 MEQ PO TBCR
20.0000 meq | EXTENDED_RELEASE_TABLET | Freq: Every day | ORAL | Status: DC
  Administered 2010-09-26 – 2010-09-27 (×2): 20 meq via ORAL
  Filled 2010-09-26 (×3): qty 1

## 2010-09-26 MED ORDER — CARVEDILOL 3.125 MG PO TABS
3.1250 mg | ORAL_TABLET | Freq: Two times a day (BID) | ORAL | Status: DC
  Administered 2010-09-27: 3.125 mg via ORAL
  Filled 2010-09-26: qty 1

## 2010-09-26 MED ORDER — POTASSIUM CHLORIDE 10 MEQ/100ML IV SOLN
INTRAVENOUS | Status: AC
  Filled 2010-09-26: qty 100

## 2010-09-26 MED ORDER — LISINOPRIL 5 MG PO TABS
2.5000 mg | ORAL_TABLET | Freq: Every day | ORAL | Status: DC
  Administered 2010-09-26: 2.5 mg via ORAL
  Filled 2010-09-26: qty 1

## 2010-09-26 MED ORDER — LISINOPRIL 5 MG PO TABS
2.5000 mg | ORAL_TABLET | Freq: Every day | ORAL | Status: DC
  Administered 2010-09-27: 2.5 mg via ORAL
  Filled 2010-09-26: qty 1

## 2010-09-26 MED ORDER — FUROSEMIDE 40 MG PO TABS
40.0000 mg | ORAL_TABLET | Freq: Every day | ORAL | Status: DC
  Administered 2010-09-27: 40 mg via ORAL
  Filled 2010-09-26: qty 1

## 2010-09-26 NOTE — Progress Notes (Signed)
Subjective: Overall the patient continues to improve. Edema is slowly decreasing in the lower airways. Dyspnea on exertion is improving. No new issues.  Objective: Vital signs in last 24 hours: Patient Vitals for the past 24 hrs:  BP Temp Temp src Pulse Resp SpO2 Weight  09/26/10 0817 - - - - - 97 % -  09/26/10 0600 110/73 mmHg 97.8 F (36.6 C) Oral 99  20  98 % 130.4 kg (287 lb 7.7 oz)  09/25/10 2200 112/77 mmHg 98.5 F (36.9 C) - 103  24  99 % -  09/25/10 1639 - - - 107  - 96 % -  09/25/10 1400 84/54 mmHg 98.3 F (36.8 C) Oral 95  20  97 % -  09/25/10 1212 - - - - 20  98 % -    Intake/Output Summary (Last 24 hours) at 09/26/10 1047 Last data filed at 09/26/10 0630  Gross per 24 hour  Intake   1020 ml  Output   1002 ml  Net     18 ml   Wt Readings from Last 3 Encounters:  09/26/10 130.4 kg (287 lb 7.7 oz)   Exam: General: No acute distress. Nontoxic. Cardiovascular: Regular rate and rhythm. No murmur, rub, or gallop, decreased lower extremity edema, 2+ bilaterally. Respiratory: Clear to auscultation bilaterally. No wheezes, Rales, or rhonchi. Normal respiratory effort.   Results for orders placed during the hospital encounter of 09/23/10 (from the past 24 hour(s))  BASIC METABOLIC PANEL     Status: Abnormal   Collection Time   09/26/10  4:24 AM      Component Value Range   Sodium 134 (*) 135 - 145 (mEq/L)   Potassium 3.6  3.5 - 5.1 (mEq/L)   Chloride 98  96 - 112 (mEq/L)   CO2 29  19 - 32 (mEq/L)   Glucose, Bld 102 (*) 70 - 99 (mg/dL)   BUN 15  6 - 23 (mg/dL)   Creatinine, Ser 9.14  0.50 - 1.10 (mg/dL)   Calcium 8.9  8.4 - 78.2 (mg/dL)   GFR calc non Af Amer >60  >60 (mL/min)   GFR calc Af Amer >60  >60 (mL/min)   Glucose, Bld  Date/Time Value Range Status  09/26/2010  4:24 AM 102* 70-99 (mg/dL) Final  9/56/2130  8:65 AM 99  70-99 (mg/dL) Final  7/84/6962  9:52 AM 117* 70-99 (mg/dL) Final  8/41/3244  0:10 PM 108* 70-99 (mg/dL) Final  2/72/5366  4:40 PM 108* 70-99  (mg/dL) Final  3/47/4259  5:63 AM 119* 70-99 (mg/dL) Final   2-D echocardiogram: Left ventricle: Wall thickness was increased in a pattern of moderate LVH. There was mild concentric hypertrophy. Systolic function was severely reduced. The estimated ejection fraction was in the range of 15% to 20%. Severe diffuse hypokinesis.  Assessment/Plan: Principal Problem -Systolic congestive heart failure, new diagnosis. Patient has global hypokinesis. Given her history of several bouts of pneumonia and bronchitis. 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 Clinically, she is improved and is ambulating better with decreased shortness of breath. Will continue IV Lasix, beta blocker therapy and will add a low-dose ACE inhibitor if her blood pressure can tolerate this. We will consult her cardiology in the morning. Active Problems -Hypertension. Appears to be stable. -Hypothyroidism. Continue replacement.  TSH within normal limits. -Smoker. Recently quit.  Code Status: Full.  Disposition Plan: Anticipate home with home health for heart failure in the next few days.  PCP: [facility] in [place].  LOS: 3 days  Pandora Leiter   Data Reviewed:  Scheduled Meds:    . carvedilol  3.125 mg Oral BID WC  . enoxaparin  40 mg Subcutaneous Q24H  . furosemide  20 mg Intravenous Q8H  . levothyroxine  336 mcg Oral Daily  . loratadine  10 mg Oral Daily  . pantoprazole  40 mg Oral Q1200  . potassium chloride  10 mEq Oral BID  . simvastatin  20 mg Oral Daily  . sodium chloride  3 mL Intravenous Q12H  . DISCONTD: pantoprazole (PROTONIX) IV  40 mg Intravenous Q24H

## 2010-09-27 ENCOUNTER — Encounter (HOSPITAL_COMMUNITY): Payer: Self-pay | Admitting: Adult Health

## 2010-09-27 MED ORDER — CARVEDILOL 12.5 MG PO TABS
12.5000 mg | ORAL_TABLET | Freq: Two times a day (BID) | ORAL | Status: DC
  Administered 2010-09-28 – 2010-09-30 (×4): 12.5 mg via ORAL
  Filled 2010-09-27 (×5): qty 1

## 2010-09-27 MED ORDER — LISINOPRIL 5 MG PO TABS
2.5000 mg | ORAL_TABLET | Freq: Two times a day (BID) | ORAL | Status: DC
  Administered 2010-09-27 – 2010-09-28 (×2): 2.5 mg via ORAL
  Filled 2010-09-27 (×2): qty 1

## 2010-09-27 MED ORDER — PNEUMOCOCCAL VAC POLYVALENT 25 MCG/0.5ML IJ INJ
0.5000 mL | INJECTION | Freq: Once | INTRAMUSCULAR | Status: AC
  Administered 2010-09-28: 0.5 mL via INTRAMUSCULAR
  Filled 2010-09-27: qty 0.5

## 2010-09-27 MED ORDER — FUROSEMIDE 10 MG/ML IJ SOLN
60.0000 mg | Freq: Three times a day (TID) | INTRAMUSCULAR | Status: DC
  Administered 2010-09-27 – 2010-09-29 (×5): 60 mg via INTRAVENOUS
  Filled 2010-09-27 (×5): qty 6

## 2010-09-27 MED ORDER — CARVEDILOL 3.125 MG PO TABS
6.2500 mg | ORAL_TABLET | Freq: Two times a day (BID) | ORAL | Status: DC
  Administered 2010-09-27: 6.25 mg via ORAL
  Filled 2010-09-27: qty 2

## 2010-09-27 NOTE — Consult Note (Addendum)
CARDIOLOGY CONSULT NOTE  Patient ID: Melanie Cordova MRN: [id number] DOB/AGE: [birth year] 50 y.o.  Admit date: 09/23/2010 Referring Physician: Dr. Lear Ng Hospitalist Service Primary Physician: [facility] health Department  Primary Cardiologist Dr. [place] Bing (new) Reason for Consultation CHF with Cardiomyopathy  HPI: This is a very pleasant 50 y/o obese CF with no known history of CAD or cardiac work-up in the past, who present to ER after several visits to ER since December of 2011 for DOE. She was treated and released each time, given breathing treatments, and followed up with [facility].  She was treated for bronchitis and pneumonia.  She states that she has been having increase DOE, PND, orthopnea and lower extremity edema increasing over the last several days with worsening symptoms two days prior to ER visit. She states she could walk less than 10 ft before becoming extremely short of breath.  On arrival to ER she was found to have elevate BNP 1680 and was treated for CHF with good diuresis.  She had echocardiogram on September 24, 2010 and was found to have EF of 15%-20% with severe diffuse hypokinesis.  We are asked for cardiology recommendations.   Review of systems complete and found to be negative unless listed above  Past Medical History  Diagnosis Date  . Hypertension   . Thyroid disease   . Asthma   . Acid reflux   . Shortness of breath   . Hypothyroidism     Family History  Problem Relation Age of Onset  . Cardiomyopathy Mother     ICD pacemaker-ischmic CM  . Cardiomyopathy Father     Deceased  . Coronary artery disease Father   . Heart failure Mother   . Hypertension Mother   . Heart failure Father     History   Social History  . Marital Status: Single    Spouse Name: N/A    Number of Children: 2  . Years of Education: N/A   Occupational History  .      Works in Science writer   Social History Main Topics  . Smoking  status: Former Smoker -- 1.0 packs/day for 20 years    Types: Cigarettes    Quit date: 09/03/2010  . Smokeless tobacco: Not on file  . Alcohol Use: No  . Drug Use: No  . Sexually Active: No   Other Topics Concern  . Not on file   Social History Narrative  . No narrative on file    History reviewed. No pertinent past surgical history.   Physical Exam: Blood pressure 104/78, pulse 94, temperature 98.1 F (36.7 C), temperature source Oral, resp. rate 16, height 5' 7.5" (1.715 m), weight 129.7 kg (285 lb 15 oz), SpO2 93.00%. General: Well developed, well nourished, in no acute distress Head: Eyes PERRLA, No xanthomas.   Normal cephalic and atramatic  Lungs: Clear bilaterally to auscultation and percussion. Heart: HRRR S1 S2, distant heart sounds  Pulses are 2+ & equal.            No carotid bruit. No JVD.  No abdominal bruits. No femoral bruits. Abdomen: Bowel sounds are positive, abdomen soft and non-tender without masses or                  Hernia's noted. Obese Msk:  Back normal, normal gait. Normal strength and tone for age. Extremities: No clubbing, cyanosis 1+ edema.  DP +1 Neuro: Alert and oriented X 3. Psych:  Good affect, responds appropriately  Labs:   Lab Results  Component Value Date   WBC 8.5 09/23/2010   HGB 11.9* 09/23/2010   HCT 38.2 09/23/2010   MCV 87.0 09/23/2010   PLT 225 09/23/2010     Lab 09/26/10 0424 09/24/10 0148  NA 134* --  K 3.6 --  CL 98 --  CO2 29 --  BUN 15 --  CREATININE 0.71 --  CALCIUM 8.9 --  PROT -- 6.0  BILITOT -- 0.8  ALKPHOS -- 91  ALT -- 35  AST -- 31  GLUCOSE 102* --   Lab Results  Component Value Date   CKTOTAL 52 09/24/2010   TROPONINI <0.30 09/24/2010    Lab Results  Component Value Date   CHOL 116 09/25/2010   Lab Results  Component Value Date   HDL 25* 09/25/2010   Lab Results  Component Value Date   LDLCALC 73 09/25/2010   Lab Results  Component Value Date   TRIG 88 09/25/2010   Lab Results  Component Value  Date   CHOLHDL 4.6 09/25/2010   No results found for this basename: LDLDIRECT      Radiology:RADIOLOGY REPORT*  Clinical Data: Shortness of breath, lower extremity swelling,  weakness and fatigue.  PORTABLE CHEST - 1 VIEW  Comparison: 09/23/2010  Findings: Trachea is midline. Heart size stable. Mild chronic  changes in the lungs. No airspace consolidation. Costophrenic  angles are not included on the film.  IMPRESSION:  Mild chronic changes in the lungs. No acute findings.  EKG: ST rate of 111 bpm with frequent PVC's  ECHO: Study Conclusions  - Left ventricle: Wall thickness was increased in a pattern of moderate LVH. There was mild concentric hypertrophy. Systolic function was severely reduced. The estimated ejection fraction was in the range of 15% to 20%. Severe diffuse hypokinesis. - Mitral valve: Mild regurgitation. - Left atrium: The atrium was moderately to severely dilated. - Right ventricle: The cavity size was mildly to moderately dilated. Wall thickness was normal. Systolic function was moderately reduced. - Right atrium: The atrium was moderately to severely dilated. - Atrial septum: No defect or patent foramen ovale was identified. Transthoracic echocardiography. M-mode, complete 2D, spectral Doppler, and color Doppler. Height: Height: 170.2cm. Height: 67in. Weight: Weight: 130.6kg. Weight: 287.4lb. Body mass index: BMI: 45.1kg/m^2. Body surface area: BSA: 2.85m^2. Patient status:    ASSESSMENT AND PLAN:   1. Systolic CHF- Dyspnea has improved with IV lasix and now on p.o.  Uncertain of decreased EF of 15-20% is ischemic in etiology at this time.  She has multiple CVRF for CAD, with family history, hypertension, thyroid disease, and hypercholesterolemia.  Recommend transfer to [facility] for cardiac catheterization for evaluation of coronary anatomy.  She will need IV pre-treatment prior to catheterization secondary to IV dye allergy,  She will continue  on Coreg 3.125 mg BID but may need to titrate higher to maintain HR and BP control.  She will continue lasix, ASA, ACE inhibitors. Daily wts and I/O.  Will not add spironolactone at this time.  Plan to transfer on Tuesday September 28, 2010.  Dr. Dietrich Pates will see and examine prior to transfer.  2. Hypercholesterolemia-Continue statin and monitor Q 3 months.  3. Hypertension:  Currently controlled. Will increase coreg to 6.25mg  BID for better control with better HR control as well.  Daily BMET on admission to [facility] and in [place].   On behalf of the physicians and providers of [facility], we would like to thank the [facility] service  for allowing up to participate in the care of this patient. More recommendations per Dr. Dietrich Pates per hospital course.  Cardiology-Dr. Dietrich Pates      Pt interviewed and examined.  She presents with sxs of CHF, moderately elevated BNP level and severe LV dysfunction.  She has improved despite a net + I/O and a modest weight loss.  Although she will ultimately require cardiac catheterization, I doubt she has CAD, and the immediate priority is optimizing medical therapy.  Cath will be deferred and performed on an outpatient basis.  IV diuresis will be continued with a substantial increase in dose of furosemide, IV.  Carvedilol and lisinopril doses will be adjusted upward.  We will follow closely with you in hospital and subsequent to discharge.  [place] Bing, MD   Signed: Bettey Mare. Lawrence NP 09/27/2010, 10:37 AM Co-Sign MD

## 2010-09-27 NOTE — Progress Notes (Addendum)
Subjective: The patient continues to improve. Dyspnea on exertion decreasing. Ambulating much better now. Edema slowly improving.  Objective: Vital signs in last 24 hours: Patient Vitals for the past 24 hrs:  BP Temp Temp src Pulse Resp SpO2 Weight  09/27/10 0847 104/78 mmHg - - 78  - - -  09/27/10 0600 99/67 mmHg 98.1 F (36.7 C) Oral 96  20  99 % 129.7 kg (285 lb 15 oz)  09/26/10 2200 104/68 mmHg 98 F (36.7 C) Oral 94  20  99 % -  09/26/10 2024 - - - - - 96 % -  09/26/10 1500 89/66 mmHg 97.4 F (36.3 C) Oral 103  22  98 % -    Intake/Output Summary (Last 24 hours) at 09/27/10 0853 Last data filed at 09/27/10 0600  Gross per 24 hour  Intake   1300 ml  Output   1400 ml  Net   -100 ml   Wt Readings from Last 3 Encounters:  09/27/10 129.7 kg (285 lb 15 oz)   Exam: General: No acute distress. Nontoxic. Cardiovascular: Regular rate and rhythm. No murmur, rub, or gallop, decreased lower extremity edema, 2+ bilaterally. telemetry reveals no arrhythmias. Respiratory: Clear to auscultation bilaterally. No wheezes, or rhonchi. Few bilateral posterior rales. Normal respiratory effort.   No results found for this or any previous visit (from the past 24 hour(s)). Glucose, Bld  Date/Time Value Range Status  09/26/2010  4:24 AM 102* 70-99 (mg/dL) Final  9/56/2130  8:65 AM 99  70-99 (mg/dL) Final  7/84/6962  9:52 AM 117* 70-99 (mg/dL) Final  8/41/3244  0:10 PM 108* 70-99 (mg/dL) Final  2/72/5366  4:40 PM 108* 70-99 (mg/dL) Final  3/47/4259  5:63 AM 119* 70-99 (mg/dL) Final   2-D echocardiogram: Left ventricle: Wall thickness was increased in a pattern of moderate LVH. There was mild concentric hypertrophy. Systolic function was severely reduced. The estimated ejection fraction was in the range of 15% to 20%. Severe diffuse hypokinesis.  Assessment/Plan: Principal Problem -Systolic congestive heart failure, new diagnosis. Patient has global hypokinesis. Given her history of several  bouts of pneumonia and bronchitis, I suspect viral or nonischemic cardiomyopathy. Clinically, she is improved and is ambulating better with decreased shortness of breath. Will continue oral Lasix, beta blocker therapy and low-dose ACE inhibitor if her blood pressure can tolerate this. We will consult her cardiology for further recommendations today. Active Problems -Hypertension. Appears to be stable. -Hypothyroidism. Continue replacement.  TSH within normal limits. -Smoker. Recently quit.  Code Status: Full.  Disposition Plan: Anticipate home soon. Await cardiology recommendations.  PCP: [facility] in [place].   LOS: 4 days   GOODRICH,DANIEL PAUL   Data Reviewed:  Scheduled Meds:    . carvedilol  3.125 mg Oral BID WC  . enoxaparin  40 mg Subcutaneous Q24H  . furosemide  20 mg Intravenous Q8H  . furosemide  40 mg Oral Daily  . levothyroxine  336 mcg Oral Daily  . lisinopril  2.5 mg Oral Daily  . loratadine  10 mg Oral Daily  . pantoprazole  40 mg Oral Q1200  . potassium chloride  20 mEq Oral Daily  . simvastatin  20 mg Oral Daily  . sodium chloride  3 mL Intravenous Q12H  . DISCONTD: carvedilol  3.125 mg Oral BID WC  . DISCONTD: lisinopril  2.5 mg Oral Daily  . DISCONTD: potassium chloride  10 mEq Oral BID

## 2010-09-28 ENCOUNTER — Encounter (HOSPITAL_COMMUNITY): Payer: Self-pay | Admitting: *Deleted

## 2010-09-28 DIAGNOSIS — I509 Heart failure, unspecified: Secondary | ICD-10-CM

## 2010-09-28 LAB — BASIC METABOLIC PANEL
CO2: 37 mEq/L — ABNORMAL HIGH (ref 19–32)
Calcium: 9.3 mg/dL (ref 8.4–10.5)
Creatinine, Ser: 0.86 mg/dL (ref 0.50–1.10)
GFR calc non Af Amer: >60 mL/min (ref >60)
Glucose, Bld: 96 mg/dL (ref 70–99)
Sodium: 140 mEq/L (ref 135–145)

## 2010-09-28 MED ORDER — LISINOPRIL 10 MG PO TABS
10.0000 mg | ORAL_TABLET | Freq: Every day | ORAL | Status: DC
  Administered 2010-09-29 – 2010-09-30 (×2): 10 mg via ORAL
  Filled 2010-09-28 (×2): qty 1

## 2010-09-28 MED ORDER — POTASSIUM CHLORIDE CRYS ER 20 MEQ PO TBCR
40.0000 meq | EXTENDED_RELEASE_TABLET | Freq: Two times a day (BID) | ORAL | Status: DC
  Administered 2010-09-28 – 2010-09-30 (×5): 40 meq via ORAL
  Filled 2010-09-28: qty 1
  Filled 2010-09-28 (×5): qty 2

## 2010-09-28 NOTE — Progress Notes (Signed)
Subjective: Patient continues to improve. Breathing is better. Lower extremity edema is slowly decreasing.  Background: This is a 50 year old woman who presents the emergency room with shortness of breath and was found to have heart failure. She is admitted for further evaluation and treatment. She had no recent chest pain. She noted that her dyspnea on exertion have been progressing over the last 6+ months. She has had several bouts of pneumonia and bronchitis in the last year. Her 2-D echocardiogram revealed global hypokinesis with severely depressed left ventricular ejection. Cardiology was consult. The plan is to continue IV Lasix at this point and continue proper heart failure medications.  Objective: Vital signs in last 24 hours: Patient Vitals for the past 24 hrs:  BP Temp Temp src Pulse Resp SpO2  09/28/10 0823 - - - - - 97 %  09/28/10 0500 110/68 mmHg 98.1 F (36.7 C) - 96  20  97 %  09/27/10 2135 108/62 mmHg 98.4 F (36.9 C) - 88  20  98 %  09/27/10 2118 - - - 89  20  95 %  09/27/10 1720 106/75 mmHg - - 105  - -  09/27/10 1405 117/79 mmHg 98.1 F (36.7 C) Oral 83  22  96 %    Intake/Output Summary (Last 24 hours) at 09/28/10 1008 Last data filed at 09/28/10 0845  Gross per 24 hour  Intake   1510 ml  Output   2450 ml  Net   -940 ml   Weight Information    Date/Time    Weight     09/27/10 0600   129.7 kg (285 lb 15 oz)     09/26/10 0600   130.4 kg (287 lb 7.7 oz)     09/23/10 2300   131 kg (288 lb 12.8 oz)     Exam: General: No acute distress. Nontoxic. Cardiovascular: Regular rate and rhythm. No murmur, rub, or gallop, decreased lower extremity edema, 2+ bilaterally.  Respiratory: Clear to auscultation bilaterally. No wheezes, or rhonchi. Few bilateral posterior rales. Normal respiratory effort.   Results for orders placed during the hospital encounter of 09/23/10 (from the past 24 hour(s))  BASIC METABOLIC PANEL     Status: Abnormal   Collection Time   09/28/10   5:36 AM      Component Value Range   Sodium 140  135 - 145 (mEq/L)   Potassium 3.4 (*) 3.5 - 5.1 (mEq/L)   Chloride 97  96 - 112 (mEq/L)   CO2 37 (*) 19 - 32 (mEq/L)   Glucose, Bld 96  70 - 99 (mg/dL)   BUN 14  6 - 23 (mg/dL)   Creatinine, Ser 1.61  0.50 - 1.10 (mg/dL)   Calcium 9.3  8.4 - 09.6 (mg/dL)   GFR calc non Af Amer >60  >60 (mL/min)   GFR calc Af Amer >60  >60 (mL/min)   2-D echocardiogram: Left ventricle: Wall thickness was increased in a pattern of moderate LVH. There was mild concentric hypertrophy. Systolic function was severely reduced. The estimated ejection fraction was in the range of 15% to 20%. Severe diffuse hypokinesis.  Assessment/Plan: #1 Systolic congestive heart failure, new diagnosis. Patient has global hypokinesis. Given her history of several bouts of pneumonia and bronchitis, I suspect viral or nonischemic cardiomyopathy. Clinically, she is improved and is ambulating better with decreased shortness of breath. Will continue IV Lasix per cardiology, beta blocker therapy and low-dose ACE inhibitor if her blood pressure can tolerate this.  #2 Hypertension. Appears to  be stable, she occasionally has mild asymptomatic hypotension. #3 Hypothyroidism. Continue replacement.  TSH within normal limits. #4 Smoker. Recently quit.  #5 Hypokalemia. Increased total daily repletion.  Code Status: Full.  Disposition Plan: Anticipate home in the next 48-72 hours.  PCP: [facility] Department in [place].   LOS: 5 days   Oakley Kossman PAUL   Data Reviewed:  Scheduled Meds:    . carvedilol  12.5 mg Oral BID WC  . enoxaparin  40 mg Subcutaneous Q24H  . furosemide  60 mg Intravenous TID  . levothyroxine  336 mcg Oral Daily  . lisinopril  2.5 mg Oral BID  . loratadine  10 mg Oral Daily  . pantoprazole  40 mg Oral Q1200  . pneumococcal 23 valent vaccine  0.5 mL Intramuscular Once  . potassium chloride  40 mEq Oral BID  . simvastatin  20 mg Oral Daily  .  sodium chloride  3 mL Intravenous Q12H  . DISCONTD: carvedilol  3.125 mg Oral BID WC  . DISCONTD: carvedilol  6.25 mg Oral BID WC  . DISCONTD: furosemide  40 mg Oral Daily  . DISCONTD: lisinopril  2.5 mg Oral Daily  . DISCONTD: potassium chloride  20 mEq Oral Daily

## 2010-09-28 NOTE — Progress Notes (Addendum)
Subjective:  Patient complains of leg cramps. She says her breathing is better and denies shortness of breath when she is up and around.  Objective:  Vital Signs in the last 24 hours: Temp:  [98.1 F (36.7 C)-98.4 F (36.9 C)] 98.1 F (36.7 C) (07/17 0500) Pulse Rate:  [83-105] 96  (07/17 0500) Resp:  [20-22] 20  (07/17 0500) BP: (106-117)/(62-79) 110/68 mmHg (07/17 0500) SpO2:  [95 %-98 %] 97 % (07/17 0823)  Intake/Output from previous day: 07/16 0701 - 07/17 0700 In: 1400 [P.O.:1400] Out: 2450 [Urine:2450] Intake/Output from this shift: I/O this shift: In: 490 [P.O.:490] Out: -   Physical Exam: NECK: Without JVD, HJR, or bruit LUNGS: Decreased breath sounds throughout, few crackles at the bases bilaterally  HEART: Regular rate and rhythm, no murmur, gallop, rub, bruit, thrill, or heave EXTREMITIES: Trace of edema, without cyanosis, clubbing  Lab Results: No results found for this basename: WBC:2,HGB:2,PLT:2 in the last 72 hours  Basename 09/28/10 0536 09/26/10 0424  NA 140 134*  K 3.4* 3.6  CL 97 98  CO2 37* 29  GLUCOSE 96 102*  BUN 14 15  CREATININE 0.86 0.71   No results found for this basename: TROPONINI:2,CK,MB:2 in the last 72 hours Hepatic Function Panel No results found for this basename: PROT,ALBUMIN,AST,ALT,ALKPHOS,BILITOT,BILIDIR,IBILI in the last 72 hours No results found for this basename: CHOL in the last 72 hours No results found for this basename: PROTIME in the last 72 hours    Cardiac Studies:  Assessment/Plan:  1. Systolic CHF EF 15-20% will need workup as an outpatient probably with cardiac catheter. Dyspnea has improved with IV lasix and now on p.o. Continued to diurese. Potassium replacement for leg cramps 2. Hypercholesterolemia-Continue statin and monitor Q 3 months.  3. Hypertension: Currently controlled. Will increase coreg to 6.25mg  BID for better control with better HR control as well. Daily BMET on admission to [facility] and in [place].         LOS: 5 days    Jacolyn Reedy 09/28/2010, 10:14 AM   Cardiology Attending -  Bing, MD  Dyspnea improved.  Increased diuresis with higher dose of IV furosemide.  No weight today.  I/O incomplete, but negative>1.1 l.  Will continue diuresis, replete KCl and increase lisinopril.  Possible discharge in AM if adequate decrease in weight.

## 2010-09-29 LAB — BASIC METABOLIC PANEL
BUN: 16 mg/dL (ref 6–23)
Calcium: 9.3 mg/dL (ref 8.4–10.5)
Creatinine, Ser: 0.78 mg/dL (ref 0.50–1.10)
GFR calc Af Amer: >60 mL/min (ref >60)
GFR calc non Af Amer: >60 mL/min (ref >60)
Glucose, Bld: 95 mg/dL (ref 70–99)
Potassium: 3.5 mEq/L (ref 3.5–5.1)

## 2010-09-29 MED ORDER — POTASSIUM CHLORIDE CRYS ER 20 MEQ PO TBCR
40.0000 meq | EXTENDED_RELEASE_TABLET | Freq: Once | ORAL | Status: AC
  Administered 2010-09-29: 40 meq via ORAL

## 2010-09-29 MED ORDER — FUROSEMIDE 10 MG/ML IJ SOLN
60.0000 mg | Freq: Three times a day (TID) | INTRAMUSCULAR | Status: DC
  Administered 2010-09-30: 60 mg via INTRAVENOUS
  Filled 2010-09-29: qty 6

## 2010-09-29 MED ORDER — POTASSIUM CHLORIDE CRYS ER 20 MEQ PO TBCR
40.0000 meq | EXTENDED_RELEASE_TABLET | Freq: Once | ORAL | Status: DC
Start: 2010-09-29 — End: 2010-09-29

## 2010-09-29 MED ORDER — POTASSIUM CHLORIDE CRYS ER 20 MEQ PO TBCR
40.0000 meq | EXTENDED_RELEASE_TABLET | Freq: Once | ORAL | Status: AC
  Administered 2010-09-29: 40 meq via ORAL
  Filled 2010-09-29: qty 2

## 2010-09-29 NOTE — Progress Notes (Addendum)
Subjective:  Patient complains of her legs feeling heavy. Her breathing has improved.  Objective:  Vital Signs in the last 24 hours: Temp:  [98.3 F (36.8 C)-98.6 F (37 C)] 98.3 F (36.8 C) (07/18 0550) Pulse Rate:  [95-107] 96  (07/18 0550) Resp:  [18-22] 18  (07/18 0550) BP: (100-113)/(71-78) 113/71 mmHg (07/18 0550) SpO2:  [92 %-99 %] 96 % (07/18 0550) Weight:  [276 lb 14.4 oz (125.6 kg)-279 lb 12.2 oz (126.9 kg)] 276 lb 14.4 oz (125.6 kg) (07/18 0550)  Intake/Output from previous day: 07/17 0701 - 07/18 0700 In: 1350 [P.O.:1350] Out: 3125 [Urine:3125] Intake/Output from this shift:    Physical Exam: NECK: Without JVD, HJR, or bruit LUNGS:Decreased breath sounds with bilateral rales at the bases HEART: Regular rate and rhythm, no murmur, gallop, rub, bruit, thrill, or heave EXTREMITIES: +1 edema bilaterally,Without cyanosis, clubbing   Lab Results: No results found for this basename: WBC:2,HGB:2,PLT:2 in the last 72 hours  Basename 09/29/10 0532 09/28/10 0536  NA 138 140  K 3.5 3.4*  CL 96 97  CO2 35* 37*  GLUCOSE 95 96  BUN 16 14  CREATININE 0.78 0.86   No results found for this basename: TROPONINI:2,CK,MB:2 in the last 72 hours Hepatic Function Panel No results found for this basename: PROT,ALBUMIN,AST,ALT,ALKPHOS,BILITOT,BILIDIR,IBILI in the last 72 hours No results found for this basename: CHOL in the last 72 hours No results found for this basename: PROTIME in the last 72 hours  Imaging:   Cardiac Studies:   Assessment/Plan:  1. Systolic CHF EF 15-20% will need workup as an outpatient probably with cardiac catheter. Dyspnea has improved with IV lasix. Almost 2 L diureses in the past 24 hours. Continued to diurese with IV Lasix for another day. Potassium replacement for leg cramps 2. Hypercholesterolemia-Continue statin and monitor Q 3 months.  3. Hypertension: Currently controlled.  4. Leg cramps: Improved but legs continued to feel heavy. We'll place  TED hose on.    LOS: 6 days    Jacolyn Reedy 09/29/2010, 8:24 AM   Cardiology Attending-Ellieanna Funderburg  Continues to diurese and improve.  Rales not impressive this evening.  Would discharge in AM on 80 mg QD of furosemide with close F/U.  She needs further repletion of hypokalemia.   Bing, MD

## 2010-09-29 NOTE — Progress Notes (Signed)
Subjective: Patient continues to improve. Breathing is better. Lower extremity edema is slowly decreasing.  She complains of chronic leg "heaviness".  Background: This is a 50 year old woman who presents the emergency room with shortness of breath and was found to have heart failure. She is admitted for further evaluation and treatment. She had no recent chest pain. She noted that her dyspnea on exertion have been progressing over the last 6+ months. She has had several bouts of pneumonia and bronchitis in the last year. Her 2-D echocardiogram revealed global hypokinesis with severely depressed left ventricular ejection. Cardiology was consult. The plan is to continue IV Lasix at this point and continue proper heart failure medications.  Objective: Vital signs in last 24 hours: Patient Vitals for the past 24 hrs:  BP Temp Temp src Pulse Resp SpO2 Weight  09/29/10 0550 113/71 mmHg 98.3 F (36.8 C) - 96  18  96 % 125.6 kg (276 lb 14.4 oz)  09/28/10 2300 - - - - - 98 % -  09/28/10 2122 100/78 mmHg 98.3 F (36.8 C) - 107  20  99 % -  09/28/10 1727 107/73 mmHg 98.6 F (37 C) Oral 107  22  98 % 126.9 kg (279 lb 12.2 oz)  09/28/10 1420 106/72 mmHg 98.5 F (36.9 C) Oral 95  20  92 % -    Intake/Output Summary (Last 24 hours) at 09/29/10 1235 Last data filed at 09/29/10 1200  Gross per 24 hour  Intake    820 ml  Output   3225 ml  Net  -2405 ml   Weight Information    Date/Time    Weight     09/27/10 0600   129.7 kg (285 lb 15 oz)     09/26/10 0600   130.4 kg (287 lb 7.7 oz)     09/23/10 2300   131 kg (288 lb 12.8 oz)     Exam: General: No acute distress. Nontoxic. Cardiovascular: Regular rate and rhythm. No murmur, rub, or gallop, decreased lower extremity edema, 2+ bilaterally.  Respiratory: Clear to auscultation bilaterally. No wheezes, or rhonchi. Few bilateral posterior rales. Normal respiratory effort.   Results for orders placed during the hospital encounter of 09/23/10 (from the  past 24 hour(s))  BASIC METABOLIC PANEL     Status: Abnormal   Collection Time   09/29/10  5:32 AM      Component Value Range   Sodium 138  135 - 145 (mEq/L)   Potassium 3.5  3.5 - 5.1 (mEq/L)   Chloride 96  96 - 112 (mEq/L)   CO2 35 (*) 19 - 32 (mEq/L)   Glucose, Bld 95  70 - 99 (mg/dL)   BUN 16  6 - 23 (mg/dL)   Creatinine, Ser 4.54  0.50 - 1.10 (mg/dL)   Calcium 9.3  8.4 - 09.8 (mg/dL)   GFR calc non Af Amer >60  >60 (mL/min)   GFR calc Af Amer >60  >60 (mL/min)   2-D echocardiogram: Left ventricle: Wall thickness was increased in a pattern of moderate LVH. There was mild concentric hypertrophy. Systolic function was severely reduced. The estimated ejection fraction was in the range of 15% to 20%. Severe diffuse hypokinesis.  Assessment/Plan: #1 Systolic congestive heart failure, new diagnosis. Patient has global hypokinesis. Given her history of several bouts of pneumonia and bronchitis, I suspect viral or nonischemic cardiomyopathy. Clinically, she is improved and is ambulating better with decreased shortness of breath. Will continue IV Lasix per cardiology, beta blocker therapy  and low-dose ACE inhibitor if her blood pressure can tolerate this. Check followup BNP & D/C when ok by Cards. #2 Hypertension. Appears to be stable, she occasionally has mild asymptomatic hypotension. #3 Hypothyroidism. Continue replacement.  TSH within normal limits. #4 Smoker. Recently quit.  #5 Hypokalemia. Increased total daily repletion. #6. Obesity #7. Leg Heaviness-Not restless leg (from mult past leg injuries, pt tolerating) Code Status: Full.  Disposition Plan: Anticipate home in the next 24-48 hours.  [facility] [place].   LOS: 6 days   Kendra Grissett K   Data Reviewed:  Scheduled Meds:    . carvedilol  12.5 mg Oral BID WC  . enoxaparin  40 mg Subcutaneous Q24H  . furosemide  60 mg Intravenous TID  . levothyroxine  336 mcg Oral Daily  . lisinopril  10 mg  Oral Daily  . loratadine  10 mg Oral Daily  . pantoprazole  40 mg Oral Q1200  . potassium chloride  40 mEq Oral BID  . simvastatin  20 mg Oral Daily  . sodium chloride  3 mL Intravenous Q12H  . DISCONTD: lisinopril  2.5 mg Oral BID

## 2010-09-30 DIAGNOSIS — E669 Obesity, unspecified: Secondary | ICD-10-CM | POA: Diagnosis present

## 2010-09-30 DIAGNOSIS — G479 Sleep disorder, unspecified: Secondary | ICD-10-CM | POA: Diagnosis present

## 2010-09-30 DIAGNOSIS — I5021 Acute systolic (congestive) heart failure: Secondary | ICD-10-CM | POA: Diagnosis present

## 2010-09-30 LAB — BASIC METABOLIC PANEL
BUN: 15 mg/dL (ref 6–23)
Calcium: 9.8 mg/dL (ref 8.4–10.5)
Creatinine, Ser: 0.75 mg/dL (ref 0.50–1.10)
GFR calc Af Amer: >60 mL/min (ref >60)
GFR calc non Af Amer: >60 mL/min (ref >60)
Glucose, Bld: 115 mg/dL — ABNORMAL HIGH (ref 70–99)

## 2010-09-30 MED ORDER — CARVEDILOL 12.5 MG PO TABS: 12.5000 mg | ORAL_TABLET | Freq: Two times a day (BID) | ORAL | Status: DC

## 2010-09-30 MED ORDER — POTASSIUM CHLORIDE CRYS ER 20 MEQ PO TBCR: 40.0000 meq | EXTENDED_RELEASE_TABLET | Freq: Two times a day (BID) | ORAL | Status: DC

## 2010-09-30 MED ORDER — FUROSEMIDE 40 MG PO TABS: 80.0000 mg | ORAL_TABLET | Freq: Two times a day (BID) | ORAL | Status: DC

## 2010-09-30 MED ORDER — LISINOPRIL 10 MG PO TABS: 10.0000 mg | ORAL_TABLET | Freq: Every day | ORAL | Status: DC

## 2010-09-30 NOTE — Progress Notes (Signed)
D/c instructions reviewed with patient. Verbalized understanding.  Pt dc'd to home with family. 

## 2010-09-30 NOTE — Progress Notes (Signed)
Patient had a 7 beat run of v-tach, asymptomatic.  MD notified.  No new orders received.  Will continue to monitor patient.  Schonewitz, Candelaria Stagers  09/30/2010 [phone]

## 2010-09-30 NOTE — Discharge Summary (Signed)
Physician Discharge Summary  Patient ID: Melanie Cordova MRN: [id number] DOB/AGE: 1961/03/05 50 y.o.  Admit date: 09/23/2010 Discharge date: 09/30/2010  Primary Care Physician:  [facility]-office number [phone]   Discharge Diagnoses:   Patient Active Problem List  Diagnoses  . Systolic CHF, acute  . Morbid obesity  . HTN (hypertension), benign  . Sleep disturbance, unspecified  . Cardiomyopathy Hypothyroidism  Asthma      Current Discharge Medication List    START taking these medications   Details  carvedilol (COREG) 12.5 MG tablet Take 1 tablet (12.5 mg total) by mouth 2 (two) times daily with a meal. Qty: 30 tablet, Refills: 0    furosemide (LASIX) 40 MG tablet Take 2 tablets (80 mg total) by mouth 2 (two) times daily. Qty: 60 tablet, Refills: 0    lisinopril (PRINIVIL,ZESTRIL) 10 MG tablet Take 1 tablet (10 mg total) by mouth daily. Qty: 30 tablet, Refills: 0    potassium chloride SA (K-DUR,KLOR-CON) 20 MEQ tablet Take 2 tablets (40 mEq total) by mouth 2 (two) times daily. Qty: 120 tablet, Refills: 0      CONTINUE these medications which have NOT CHANGED   Details  Albuterol Sulfate (VENTOLIN HFA IN) Inhale 2 puffs into the lungs every 4 (four) hours as needed. Shortness of breath     atorvastatin (LIPITOR) 20 MG tablet Take 20 mg by mouth at bedtime.      Calcium Carbonate-Vitamin D (CALTRATE 600+D) 600-400 MG-UNIT per chew tablet Chew 1 tablet by mouth 2 (two) times daily with a meal.      ferrous gluconate (FERGON) 324 MG tablet Take 324 mg by mouth daily with breakfast.      levothyroxine (SYNTHROID, LEVOTHROID) 112 MCG tablet Take 336 mcg by mouth daily.     loratadine (CLARITIN REDITABS) 10 MG dissolvable tablet Take 10 mg by mouth daily.      Multiple Vitamins-Minerals (CENTRUM SILVER ULTRA WOMENS PO) Take 1 tablet by mouth daily.      ranitidine (ZANTAC) 150 MG tablet Take 150 mg by mouth 2 (two) times daily as needed.  For gastric reflux          Disposition and Follow-up:   Disposition: Acute improvement given that she has been diuresed however given her very limited ejection fraction her long-term prognosis may be poor.  Activity: Slowly increase  Diet heart healthy  Followup: Patient will followup with her primary care physician at [facility] Department in the next one month.  She'll also need to follow up with Dr. Dietrich Pates, cardiology in the next one week.  Consults:  cardiology Dr. Dietrich Pates.   Significant Diagnostic Studies:  Dg Chest 2 View  09/23/2010   IMPRESSION: Cardiomegaly, with significant interval increase in the heart size since 2005.  Stable chronic bronchitis and/or asthma.  No acute cardiopulmonary disease.  Original Report Authenticated By: Arnell Sieving, M.D.   2-D echocardiogram: Left ventricle: Wall thickness was increased in a pattern of moderate LVH. There was mild concentric hypertrophy. Systolic function was severely reduced. The estimated ejection fraction was in the range of 15% to 20%. Severe diffuse hypokinesis.    Hospital Course:   . Systolic CHF, acute: Patient is a 50 year old female with a history of asthma and hypothyroidism and obesity who presented to the emergency room on 7/12 complaining of shortness of breath. She has been receiving her health care from [facility] and apparently for both her blood pressure and edema have received a prescription  for Lasix plus replacement potassium but had never filled the prescription because she was worried of her medications. She was noted to have a pro BNP of 1666 and a chest x-ray which showed no signs of any pneumonia patient was admitted for congestive heart failure which was a new diagnosis, started on diuretics and an echo was ordered.  Patient's echocardiogram was startling for moderate LVH with mild concentric hypertrophy with severely reduced systolic ejection fraction  of 15-20% and severe diffuse hypokinesis. Dr. Dietrich Pates was consult in for cardiology. In his evaluation he felt it was unclear whether the patient had an ischemic versus nonischemic cardiomyopathy. He recommended short-term stabilization with diuretics and then close outpatient followup which included cardiac catheterization. In the meantime the patient was started on standard therapy including beta blocker and ACE inhibitor as well as diuresis.  By 7/19, patient was feeling better she was able to ambulate with an O2 sat of 95%.   . Morbid obesity  . HTN (hypertension), benign-as above will discharge the patient home on new beta blocker, ACE inhibitor and Lasix.   . Sleep disturbance, unspecified-patient has received a prescription already from the health Department for when necessary Ambien.   . Cardiomyopathy-see above. Hypothyroidism-continue Synthroid Asthma continue as needed inhalers.        SignedHollice Espy 09/30/2010, 11:56 AM

## 2010-10-19 ENCOUNTER — Encounter: Payer: Self-pay | Admitting: Cardiology

## 2010-10-19 ENCOUNTER — Ambulatory Visit (INDEPENDENT_AMBULATORY_CARE_PROVIDER_SITE_OTHER): Payer: Self-pay | Admitting: Cardiology

## 2010-10-19 ENCOUNTER — Encounter: Payer: Self-pay | Admitting: *Deleted

## 2010-10-19 VITALS — BP 94/65 | HR 98 | Ht 67.0 in | Wt 271.0 lb

## 2010-10-19 DIAGNOSIS — I428 Other cardiomyopathies: Secondary | ICD-10-CM

## 2010-10-19 DIAGNOSIS — K219 Gastro-esophageal reflux disease without esophagitis: Secondary | ICD-10-CM

## 2010-10-19 DIAGNOSIS — I10 Essential (primary) hypertension: Secondary | ICD-10-CM

## 2010-10-19 DIAGNOSIS — E039 Hypothyroidism, unspecified: Secondary | ICD-10-CM

## 2010-10-19 DIAGNOSIS — E669 Obesity, unspecified: Secondary | ICD-10-CM

## 2010-10-19 NOTE — Progress Notes (Signed)
HPI : Melanie Cordova returns to the office following a recent admission to [facility] with congestive heart failure.  Cardiomyopathy was diagnosed with an ejection fraction below 20%.  She has responded fairly well to medical therapy with resolution of resting and most exertional dyspnea.  She has noted no pedal edema, orthopnea nor PND.  She has developed diffuse pain, mostly in her extremities.  Discomfort appears to emanate from her muscles.  There is no joint pain and no skin irritation.  She attributes these symptoms to either furosemide or potassium chloride.  Current Outpatient Prescriptions on File Prior to Visit  Medication Sig Dispense Refill  . Albuterol Sulfate (VENTOLIN HFA IN) Inhale 2 puffs into the lungs every 4 (four) hours as needed. Shortness of breath       . carvedilol (COREG) 12.5 MG tablet Take 1 tablet (12.5 mg total) by mouth 2 (two) times daily with a meal.  30 tablet  0  . ferrous gluconate (FERGON) 324 MG tablet Take 324 mg by mouth daily with breakfast.        . levothyroxine (SYNTHROID, LEVOTHROID) 112 MCG tablet Take 336 mcg by mouth daily.       Marland Kitchen lisinopril (PRINIVIL,ZESTRIL) 10 MG tablet Take 1 tablet (10 mg total) by mouth daily.  30 tablet  0  . loratadine (CLARITIN REDITABS) 10 MG dissolvable tablet Take 10 mg by mouth daily.        . Multiple Vitamins-Minerals (CENTRUM SILVER ULTRA WOMENS PO) Take 1 tablet by mouth daily.        . potassium chloride SA (K-DUR,KLOR-CON) 20 MEQ tablet Take 2 tablets (40 mEq total) by mouth 2 (two) times daily.  120 tablet  0  . ranitidine (ZANTAC) 150 MG tablet Take 150 mg by mouth 2 (two) times daily as needed. For gastric reflux       . Calcium Carbonate-Vitamin D (CALTRATE 600+D) 600-400 MG-UNIT per chew tablet Chew 1 tablet by mouth 2 (two) times daily with a meal.           Allergies  Allergen Reactions  . Penicillins Shortness Of Breath    Hair loss  . Iodinated Diagnostic Agents Hives and Other (See Comments)   Pulmonary problems; no frank respiratory arrest  . Sulfa Antibiotics Other (See Comments)    Pt can't remember reaction      Past medical history, social history, and family history reviewed and updated.  Patient has secured food stamps, but is receiving no other government support.  She has been informed that she is in eligible for Medicaid.  She has initiated an application for disability, but this will undoubtedly require months if not years of effort.  She has asked to participate in the states drug assistance program, but has not been accepted.  ROS: See history of present illness.  PHYSICAL EXAM: BP 94/65  Pulse 98  Ht 5\' 7"  (1.702 m)  Wt 122.925 kg (271 lb)  BMI 42.44 kg/m2  SpO2 98%  General-Well developed; no acute distress Body habitus-obese Neck-No JVD; no carotid bruits Lungs-clear lung fields; resonant to percussion Cardiovascular-normal PMI; normal S1 and S2; fourth heart sound present; minimal systolic murmur Abdomen-normal bowel sounds; soft and non-tender without masses or organomegaly Musculoskeletal-No deformities, no cyanosis or clubbing Neurologic-Normal cranial nerves; symmetric strength and tone Skin-Warm, no significant lesions Extremities-distal pulses intact; trace edema   ASSESSMENT AND PLAN:

## 2010-10-19 NOTE — Patient Instructions (Addendum)
Your physician recommends that you schedule a follow-up appointment in: 6 weeks Your physician has recommended you make the following change in your medication: stop atorvastatin, change lasix to 80mg  daily , call for increased weight Your physician recommends that you return for lab work in:  Today and in 1 month Please call social services for help with medical expenses

## 2010-10-19 NOTE — Assessment & Plan Note (Signed)
Blood pressure is under excellent control.  Current therapy will be continued.  Low blood pressure precludes a significant increase in any medications associated with blood pressure reduction.

## 2010-10-19 NOTE — Assessment & Plan Note (Signed)
CHF appears to be well compensated with current therapy.  Due to relative hypotension, dose of carvedilol and lisinopril cannot be increased.  Fairly high dose of furosemide may not be necessary.  She will reduce to 80 mg q.d. And follow weights, peripheral edema and symptoms.  I will reassess this nice woman in 3 months.

## 2010-10-19 NOTE — Assessment & Plan Note (Signed)
Hypertension is not a problem at present.  We will watch for hypotension.

## 2010-10-20 LAB — BASIC METABOLIC PANEL
BUN: 13 mg/dL (ref 6–23)
CO2: 25 mEq/L (ref 19–32)
Calcium: 9.1 mg/dL (ref 8.4–10.5)
Creat: 0.85 mg/dL (ref 0.50–1.10)
Glucose, Bld: 87 mg/dL (ref 70–99)

## 2010-10-22 ENCOUNTER — Encounter: Payer: Self-pay | Admitting: *Deleted

## 2010-10-22 ENCOUNTER — Telehealth: Payer: Self-pay | Admitting: *Deleted

## 2010-10-22 NOTE — Assessment & Plan Note (Signed)
The patient's knowledge of and potential interest in bariatric surgery will be explored at her next visit.

## 2010-10-22 NOTE — Telephone Encounter (Signed)
I discussed substitutions with Dr. Dietrich Pates Will change  lipitor to crestor 20mg  daily Carvedilol 12mg  twice daily to toprol xl 50mg  daily Lisinopril 10mg  daily to accupril 10mg  daily

## 2010-10-31 ENCOUNTER — Emergency Department (HOSPITAL_COMMUNITY): Payer: Self-pay

## 2010-10-31 ENCOUNTER — Other Ambulatory Visit: Payer: Self-pay

## 2010-10-31 ENCOUNTER — Encounter (HOSPITAL_COMMUNITY): Payer: Self-pay | Admitting: Emergency Medicine

## 2010-10-31 ENCOUNTER — Inpatient Hospital Stay (HOSPITAL_COMMUNITY)
Admission: EM | Admit: 2010-10-31 | Discharge: 2010-11-03 | DRG: 292 | Disposition: A | Discharge status: Home Health | Payer: MEDICAID | Attending: Internal Medicine | Admitting: Internal Medicine

## 2010-10-31 DIAGNOSIS — I5021 Acute systolic (congestive) heart failure: Secondary | ICD-10-CM | POA: Diagnosis present

## 2010-10-31 DIAGNOSIS — E039 Hypothyroidism, unspecified: Secondary | ICD-10-CM | POA: Diagnosis present

## 2010-10-31 DIAGNOSIS — I5023 Acute on chronic systolic (congestive) heart failure: Principal | ICD-10-CM | POA: Diagnosis present

## 2010-10-31 DIAGNOSIS — I9589 Other hypotension: Secondary | ICD-10-CM | POA: Diagnosis present

## 2010-10-31 DIAGNOSIS — I959 Hypotension, unspecified: Secondary | ICD-10-CM | POA: Diagnosis present

## 2010-10-31 DIAGNOSIS — K219 Gastro-esophageal reflux disease without esophagitis: Secondary | ICD-10-CM | POA: Diagnosis present

## 2010-10-31 DIAGNOSIS — Z598 Other problems related to housing and economic circumstances: Secondary | ICD-10-CM

## 2010-10-31 DIAGNOSIS — Z6841 Body Mass Index (BMI) 40.0 and over, adult: Secondary | ICD-10-CM

## 2010-10-31 DIAGNOSIS — I10 Essential (primary) hypertension: Secondary | ICD-10-CM | POA: Diagnosis present

## 2010-10-31 DIAGNOSIS — G479 Sleep disorder, unspecified: Secondary | ICD-10-CM

## 2010-10-31 DIAGNOSIS — E669 Obesity, unspecified: Secondary | ICD-10-CM | POA: Diagnosis present

## 2010-10-31 DIAGNOSIS — I2589 Other forms of chronic ischemic heart disease: Secondary | ICD-10-CM | POA: Diagnosis present

## 2010-10-31 DIAGNOSIS — Z9119 Patient's noncompliance with other medical treatment and regimen: Secondary | ICD-10-CM

## 2010-10-31 DIAGNOSIS — Z91199 Patient's noncompliance with other medical treatment and regimen due to unspecified reason: Secondary | ICD-10-CM

## 2010-10-31 DIAGNOSIS — I509 Heart failure, unspecified: Secondary | ICD-10-CM | POA: Diagnosis present

## 2010-10-31 DIAGNOSIS — Z5987 Material hardship due to limited financial resources, not elsewhere classified: Secondary | ICD-10-CM

## 2010-10-31 HISTORY — DX: Heart failure, unspecified: I50.9

## 2010-10-31 LAB — BASIC METABOLIC PANEL
BUN: 15 mg/dL (ref 6–23)
GFR calc Af Amer: >60 mL/min (ref >60)
GFR calc non Af Amer: >60 mL/min (ref >60)
Potassium: 4 mEq/L (ref 3.5–5.1)

## 2010-10-31 LAB — CBC
Hemoglobin: 12.3 g/dL (ref 12.0–15.0)
MCH: 26.3 pg (ref 26.0–34.0)
MCHC: 31.5 g/dL (ref 30.0–36.0)
Platelets: 233 10*3/uL (ref 150–400)
RDW: 16.5 % — ABNORMAL HIGH (ref 11.5–15.5)

## 2010-10-31 LAB — DIFFERENTIAL
Basophils Relative: 1 % (ref 0–1)
Eosinophils Absolute: 0.3 10*3/uL (ref 0.0–0.7)
Monocytes Relative: 6 % (ref 3–12)
Neutrophils Relative %: 63 % (ref 43–77)

## 2010-10-31 LAB — MRSA PCR SCREENING: MRSA by PCR: NEGATIVE

## 2010-10-31 LAB — TSH: TSH: 104.166 u[IU]/mL — ABNORMAL HIGH (ref 0.350–4.500)

## 2010-10-31 MED ORDER — ALBUTEROL SULFATE (5 MG/ML) 0.5% IN NEBU
2.5000 mg | INHALATION_SOLUTION | RESPIRATORY_TRACT | Status: DC | PRN
  Administered 2010-11-02: 2.5 mg via RESPIRATORY_TRACT
  Filled 2010-10-31: qty 0.5

## 2010-10-31 MED ORDER — ALBUTEROL SULFATE (5 MG/ML) 0.5% IN NEBU
5.0000 mg | INHALATION_SOLUTION | Freq: Once | RESPIRATORY_TRACT | Status: AC
  Administered 2010-10-31: 5 mg via RESPIRATORY_TRACT
  Filled 2010-10-31: qty 1

## 2010-10-31 MED ORDER — THERA M PLUS PO TABS
1.0000 | ORAL_TABLET | Freq: Every day | ORAL | Status: DC
  Administered 2010-10-31 – 2010-11-03 (×4): 1 via ORAL
  Filled 2010-10-31 (×4): qty 1

## 2010-10-31 MED ORDER — IPRATROPIUM BROMIDE 0.02 % IN SOLN
0.5000 mg | Freq: Once | RESPIRATORY_TRACT | Status: AC
  Administered 2010-10-31: 0.5 mg via RESPIRATORY_TRACT
  Filled 2010-10-31: qty 2.5

## 2010-10-31 MED ORDER — OXYCODONE HCL 5 MG PO TABS
5.0000 mg | ORAL_TABLET | ORAL | Status: DC | PRN
  Administered 2010-11-02 – 2010-11-03 (×4): 5 mg via ORAL
  Filled 2010-10-31 (×5): qty 1

## 2010-10-31 MED ORDER — MORPHINE SULFATE 2 MG/ML IJ SOLN
2.0000 mg | Freq: Once | INTRAMUSCULAR | Status: AC
  Administered 2010-10-31: 2 mg via INTRAVENOUS
  Filled 2010-10-31: qty 1

## 2010-10-31 MED ORDER — LISINOPRIL 5 MG PO TABS
2.5000 mg | ORAL_TABLET | Freq: Every day | ORAL | Status: DC
  Administered 2010-10-31 – 2010-11-03 (×4): 2.5 mg via ORAL
  Filled 2010-10-31 (×6): qty 1

## 2010-10-31 MED ORDER — SODIUM CHLORIDE 0.9 % IV SOLN
INTRAVENOUS | Status: AC
  Administered 2010-10-31 (×2): via INTRAVENOUS

## 2010-10-31 MED ORDER — IBUPROFEN 400 MG PO TABS
400.0000 mg | ORAL_TABLET | Freq: Every evening | ORAL | Status: DC | PRN
  Administered 2010-10-31: 400 mg via ORAL
  Filled 2010-10-31: qty 1

## 2010-10-31 MED ORDER — FUROSEMIDE 10 MG/ML IJ SOLN
80.0000 mg | Freq: Once | INTRAMUSCULAR | Status: AC
  Administered 2010-10-31: 80 mg via INTRAVENOUS
  Filled 2010-10-31: qty 8

## 2010-10-31 MED ORDER — LEVOTHYROXINE SODIUM 112 MCG PO TABS
336.0000 ug | ORAL_TABLET | Freq: Every day | ORAL | Status: DC
  Administered 2010-10-31 – 2010-11-03 (×3): 336 ug via ORAL
  Filled 2010-10-31 (×6): qty 3

## 2010-10-31 MED ORDER — SODIUM CHLORIDE 0.9 % IJ SOLN
3.0000 mL | Freq: Two times a day (BID) | INTRAMUSCULAR | Status: DC
  Administered 2010-10-31 – 2010-11-03 (×7): 3 mL via INTRAVENOUS
  Filled 2010-10-31 (×7): qty 3

## 2010-10-31 MED ORDER — SODIUM CHLORIDE 0.9 % IJ SOLN
3.0000 mL | INTRAMUSCULAR | Status: DC | PRN
  Administered 2010-10-31: 3 mL via INTRAVENOUS

## 2010-10-31 MED ORDER — ROSUVASTATIN CALCIUM 20 MG PO TABS
20.0000 mg | ORAL_TABLET | Freq: Every day | ORAL | Status: DC
  Administered 2010-10-31 – 2010-11-03 (×4): 20 mg via ORAL
  Filled 2010-10-31 (×4): qty 1

## 2010-10-31 MED ORDER — ONDANSETRON HCL 4 MG PO TABS: 4.0000 mg | ORAL_TABLET | Freq: Four times a day (QID) | ORAL | Status: DC | PRN

## 2010-10-31 MED ORDER — ONDANSETRON HCL 4 MG/2ML IJ SOLN: 4.0000 mg | Freq: Four times a day (QID) | INTRAMUSCULAR | Status: DC | PRN

## 2010-10-31 MED ORDER — ALBUTEROL SULFATE (5 MG/ML) 0.5% IN NEBU: 2.5000 mg | INHALATION_SOLUTION | Freq: Once | RESPIRATORY_TRACT | Status: DC

## 2010-10-31 MED ORDER — FUROSEMIDE 10 MG/ML IJ SOLN
20.0000 mg | Freq: Two times a day (BID) | INTRAMUSCULAR | Status: DC
  Administered 2010-10-31 – 2010-11-01 (×2): 20 mg via INTRAVENOUS
  Filled 2010-10-31 (×2): qty 2

## 2010-10-31 MED ORDER — SODIUM CHLORIDE 0.9 % IV BOLUS (SEPSIS)
250.0000 mL | Freq: Once | INTRAVENOUS | Status: AC
  Administered 2010-10-31: 250 mL via INTRAVENOUS

## 2010-10-31 MED ORDER — FERROUS GLUCONATE 324 (38 FE) MG PO TABS
324.0000 mg | ORAL_TABLET | Freq: Every day | ORAL | Status: DC
  Administered 2010-11-01 – 2010-11-03 (×3): 324 mg via ORAL
  Filled 2010-10-31 (×7): qty 1

## 2010-10-31 MED ORDER — PANTOPRAZOLE SODIUM 40 MG PO TBEC
40.0000 mg | DELAYED_RELEASE_TABLET | Freq: Every day | ORAL | Status: DC
Start: 2010-10-31 — End: 2010-11-03
  Administered 2010-10-31 – 2010-11-03 (×4): 40 mg via ORAL
  Filled 2010-10-31 (×4): qty 1

## 2010-10-31 MED ORDER — ONDANSETRON HCL 4 MG/2ML IJ SOLN
4.0000 mg | Freq: Once | INTRAMUSCULAR | Status: AC
  Administered 2010-10-31: 4 mg via INTRAVENOUS
  Filled 2010-10-31: qty 2

## 2010-10-31 MED ORDER — ENOXAPARIN SODIUM 40 MG/0.4ML ~~LOC~~ SOLN
40.0000 mg | SUBCUTANEOUS | Status: DC
  Administered 2010-10-31 – 2010-11-02 (×3): 40 mg via SUBCUTANEOUS
  Filled 2010-10-31 (×3): qty 0.4

## 2010-10-31 MED ORDER — SENNA 8.6 MG PO TABS: 2.0000 | ORAL_TABLET | Freq: Every day | ORAL | Status: DC | PRN

## 2010-10-31 MED ORDER — SODIUM CHLORIDE 0.9 % IV SOLN
250.0000 mL | INTRAVENOUS | Status: DC
  Administered 2010-10-31: 50 mL via INTRAVENOUS
  Administered 2010-11-01: 02:00:00 via INTRAVENOUS

## 2010-10-31 MED ORDER — DIPHENHYDRAMINE HCL 25 MG PO CAPS
25.0000 mg | ORAL_CAPSULE | Freq: Every evening | ORAL | Status: DC | PRN
  Administered 2010-10-31: 25 mg via ORAL
  Filled 2010-10-31: qty 1

## 2010-10-31 MED ORDER — METOPROLOL TARTRATE 25 MG PO TABS
12.5000 mg | ORAL_TABLET | Freq: Two times a day (BID) | ORAL | Status: DC
  Administered 2010-10-31 – 2010-11-02 (×4): 12.5 mg via ORAL
  Administered 2010-11-02: 11:00:00 via ORAL
  Filled 2010-10-31 (×5): qty 1

## 2010-10-31 MED ORDER — ACETAMINOPHEN 325 MG PO TABS: 650.0000 mg | ORAL_TABLET | Freq: Four times a day (QID) | ORAL | Status: DC | PRN

## 2010-10-31 MED ORDER — ACETAMINOPHEN 650 MG RE SUPP: 650.0000 mg | Freq: Four times a day (QID) | RECTAL | Status: DC | PRN

## 2010-10-31 NOTE — Progress Notes (Signed)
4098 Patient now with low blood pressure. She is sitting up in bed, O2 sat 98%, placed on 2 L [place], bolus of 250 cc IVF ordered. Spoke with Dr. Ardyth Harps who will see patient in the ER. Will change bed status to step down.

## 2010-10-31 NOTE — ED Notes (Signed)
Notified nurse of low b/p.  Patient did advise that b/p was low before she recd. Morphine.

## 2010-10-31 NOTE — ED Notes (Signed)
Family at bedside. 

## 2010-10-31 NOTE — ED Notes (Signed)
Patient is resting comfortably. 

## 2010-10-31 NOTE — ED Notes (Signed)
Increased sob for 2 days out of "heart meds" since last thursday

## 2010-10-31 NOTE — ED Notes (Addendum)
MD at bedside. 

## 2010-10-31 NOTE — ED Notes (Signed)
Informed edp of vitals, per edp, still give pt pain meds.

## 2010-10-31 NOTE — H&P (Signed)
PCP:   No primary provider on file.   Chief Complaint:  Shortness of breath, orthopnea, increased lower extremity edema.  HPI: Melanie Cordova is a 50 year old white woman with a history of hypertension, hyperlipidemia, morbid obesity, and most importantly systolic CHF with ejection fraction of 10-15% recently diagnosed in July of this year, who presents to the hospital today with the above-mentioned complaints. Patient states that on Thursday (4 days prior to admission) she ran out of her medications. She went to work and became very fatigued and short of breath and has not felt herself ever since. This is why she has sought help in the emergency department today. Of note she was seen in consultation by Dr. Dietrich Pates in her admission last month and it appears that it was decided to defer cardiac catheterization at that time to optimize her medication regimen. Because of these issues we are asked to admit her for further evaluation and management.  Allergies:   Allergies  Allergen Reactions  . Penicillins Shortness Of Breath    Hair loss  . Iodinated Diagnostic Agents Hives and Other (See Comments)    Pulmonary problems; no frank respiratory arrest  . Sulfa Antibiotics Other (See Comments)    Pt can't remember reaction      Past Medical History  Diagnosis Date  . Hypertension     09/2010-normal CMet and CBC; Lipid profile-116, 88, 25, 73  . Cardiomyopathy 08/2010    Presented with congestive heart failure  . Gastroesophageal reflux disease   . Hypothyroidism     Recent TSH was normal.  . Pneumonia   . Obesity   . CHF (congestive heart failure)     History reviewed. No pertinent past surgical history.  Prior to Admission medications   Medication Sig Start Date End Date Taking? Authorizing Provider  Albuterol Sulfate (VENTOLIN HFA IN) Inhale 2 puffs into the lungs every 4 (four) hours as needed. Shortness of breath    Yes Historical Provider, MD  Calcium Carbonate-Vitamin D  (CALTRATE 600+D) 600-400 MG-UNIT per chew tablet Chew 1 tablet by mouth 2 (two) times daily with a meal.     Yes Historical Provider, MD  ferrous gluconate (FERGON) 324 MG tablet Take 324 mg by mouth daily with breakfast.     Yes Historical Provider, MD  furosemide (LASIX) 40 MG tablet Take 80 mg by mouth daily. Call for increased weight  09/30/10 09/30/11 Yes Sendil K Krishnan  levothyroxine (SYNTHROID, LEVOTHROID) 112 MCG tablet Take 336 mcg by mouth daily.    Yes Historical Provider, MD  loratadine (CLARITIN REDITABS) 10 MG dissolvable tablet Take 10 mg by mouth daily.     Yes Historical Provider, MD  metoprolol (TOPROL-XL) 50 MG 24 hr tablet Take 50 mg by mouth daily.     Yes Historical Provider, MD  Multiple Vitamins-Minerals (CENTRUM SILVER ULTRA WOMENS PO) Take 1 tablet by mouth daily.     Yes Historical Provider, MD  potassium chloride SA (K-DUR,KLOR-CON) 20 MEQ tablet Take 2 tablets (40 mEq total) by mouth 2 (two) times daily. 09/30/10 09/30/11 Yes Sendil K Krishnan  ranitidine (ZANTAC) 150 MG tablet Take 150 mg by mouth 2 (two) times daily as needed. For gastric reflux    Yes Historical Provider, MD  rosuvastatin (CRESTOR) 20 MG tablet Take 20 mg by mouth daily.     Yes Historical Provider, MD  quinapril (ACCUPRIL) 10 MG tablet Take 10 mg by mouth at bedtime.      Historical Provider, MD    Social  History:  reports that she has been smoking Cigarettes.  She has a 10 pack-year smoking history. She does not have any smokeless tobacco history on file. She reports that she does not drink alcohol or use illicit drugs.  Family History  Problem Relation Age of Onset  . Cardiomyopathy Mother     ICD pacemaker-ischmic CM  . Heart failure Mother   . Hypertension Mother   . Cardiomyopathy Father     Deceased  . Coronary artery disease Father   . Heart failure Father     Review of Systems:  Constitutional: Denies fever, chills, diaphoresis, appetite change and fatigue.  HEENT: Denies  photophobia, eye pain, redness, hearing loss, ear pain, congestion, sore throat, rhinorrhea, sneezing, mouth sores, trouble swallowing, neck pain, neck stiffness and tinnitus.   Respiratory: Denies cough, chest tightness,  and wheezing.   Cardiovascular: Denies chest pain, palpitations. Gastrointestinal: Denies nausea, vomiting, abdominal pain, diarrhea, constipation, blood in stool and abdominal distention.  Genitourinary: Denies dysuria, urgency, frequency, hematuria, flank pain and difficulty urinating.  Musculoskeletal: Denies myalgias, back pain, joint swelling, arthralgias and gait problem.  Skin: Denies pallor, rash and wound.  Neurological: Denies dizziness, seizures, syncope, weakness, light-headedness, numbness and headaches.  Hematological: Denies adenopathy. Easy bruising, personal or family bleeding history  Psychiatric/Behavioral: Denies suicidal ideation, mood changes, confusion, nervousness, sleep disturbance and agitation   Physical Exam: Blood pressure 93/71, pulse 80, temperature 98.1 F (36.7 C), temperature source Oral, resp. rate 22, height 5\' 7"  (1.702 m), weight 120.203 kg (265 lb), SpO2 99.00%. General: Alert, awake, oriented x3. HEENT: Normocephalic, atraumatic, pupils equal round reactive to light, moist mucous membranes. Neck: Supple, no JVD, no lymphadenopathy, no bruits, no goiter. Cardiovascular: Regular rate and rhythm, S4 gallop present. No murmurs or rubs. Lungs: Bibasilar crackles. Abdomen: Soft, nontender, obese, nondistended, positive bowel sounds, no masses or organomegaly noted. Extremities: 2+ pitting edema to knees bilaterally. Neuro: Grossly intact and nonfocal.  Labs on Admission:  Results for orders placed during the hospital encounter of 10/31/10 (from the past 48 hour(s))  PRO B NATRIURETIC PEPTIDE     Status: Abnormal   Collection Time   10/31/10  4:34 AM      Component Value Range Comment   BNP, POC 1978.0 (*) 0 - 125 (pg/mL)   CBC      Status: Abnormal   Collection Time   10/31/10  4:35 AM      Component Value Range Comment   WBC 9.3  4.0 - 10.5 (K/uL)    RBC 4.68  3.87 - 5.11 (MIL/uL)    Hemoglobin 12.3  12.0 - 15.0 (g/dL)    HCT 78.2  95.6 - 21.3 (%)    MCV 83.5  78.0 - 100.0 (fL)    MCH 26.3  26.0 - 34.0 (pg)    MCHC 31.5  30.0 - 36.0 (g/dL)    RDW 08.6 (*) 57.8 - 15.5 (%)    Platelets 233  150 - 400 (K/uL)   DIFFERENTIAL     Status: Normal   Collection Time   10/31/10  4:35 AM      Component Value Range Comment   Neutrophils Relative 63  43 - 77 (%)    Neutro Abs 5.9  1.7 - 7.7 (K/uL)    Lymphocytes Relative 28  12 - 46 (%)    Lymphs Abs 2.6  0.7 - 4.0 (K/uL)    Monocytes Relative 6  3 - 12 (%)    Monocytes Absolute 0.5  0.1 -  1.0 (K/uL)    Eosinophils Relative 3  0 - 5 (%)    Eosinophils Absolute 0.3  0.0 - 0.7 (K/uL)    Basophils Relative 1  0 - 1 (%)    Basophils Absolute 0.1  0.0 - 0.1 (K/uL)   BASIC METABOLIC PANEL     Status: Abnormal   Collection Time   10/31/10  4:35 AM      Component Value Range Comment   Sodium 134 (*) 135 - 145 (mEq/L)    Potassium 4.0  3.5 - 5.1 (mEq/L)    Chloride 100  96 - 112 (mEq/L)    CO2 23  19 - 32 (mEq/L)    Glucose, Bld 99  70 - 99 (mg/dL)    BUN 15  6 - 23 (mg/dL)    Creatinine, Ser 4.09  0.50 - 1.10 (mg/dL)    Calcium 9.6  8.4 - 10.5 (mg/dL)    GFR calc non Af Amer >60  >60 (mL/min)    GFR calc Af Amer >60  >60 (mL/min)     Radiological Exams on Admission: Dg Chest Portable 1 View  10/31/2010  *RADIOLOGY REPORT*  Clinical Data: Shortness of breath; history of congestive heart failure.  PORTABLE CHEST - 1 VIEW  Comparison: Chest radiograph performed 09/24/2010  Findings: The lungs are well-aerated.  Increased interstitial markings are noted, with mild bibasilar opacities and vascular congestion; these findings appear stable from multiple prior studies, raising question for chronic interstitial changes versus recurrent edema.  No definite pleural effusion or  pneumothorax is seen.  The cardiomediastinal silhouette is mildly enlarged.  No acute osseous abnormalities are seen.  IMPRESSION: Increased interstitial markings, with mild bibasilar opacities, vascular congestion and mild cardiomegaly; these findings appear stable from multiple prior studies, raising question for chronic interstitial changes versus recurrent pulmonary edema.  Original Report Authenticated By: Tonia Ghent, M.D.    Assessment/Plan  1.  Acute Systolic CHF: This was recently diagnosed one month ago. At that time she had a 2-D echocardiogram that showed an ejection fraction of 10-15%. Also notes by cardiology  would indicate that it was decided to defer cardiac catheterization at that time. It is possible that acute exacerbation is secondary to the fact that she has not been taking her medications in 4 days. I will restart her medications, at a lower dose given her hypotension, she will be admitted to the step down unit given her low blood pressures for monitoring overnight. Continue aspirin, statin, beta blocker, ACE inhibitor, Lasix. Will again ask cardiology to see her in the morning. 2.  Hypotension: Patient received 2 doses of morphine and 80 mg of Lasix in the ED prompting her hypotension. She was not hypotensive on arrival. It appears ED physician gave her a 250 cc bolus of fluid. This moment I will elect to hold fluid and start BP meds at a low dose to see how she tolerates. She will be in the step down unit. 3.  Hypertension: Currently hypotensive. 4.  Morbid obesity 5.  Gastroesophageal reflux disease 6.  Hypothyroidism: Check TSH, continue home dose of Synthroid.   Time Spent on Admission: Approximately 60 minutes.  HERNANDEZ ACOSTA,Angla Delahunt 10/31/2010, 11:49 AM

## 2010-10-31 NOTE — ED Provider Notes (Signed)
History     CSN: [id number] Arrival date & time: 10/31/2010  3:40 AM  Chief Complaint  Patient presents with  . Respiratory Distress   HPI Comments: Seen 0406. Patient with h/o asthma, cardiomyopathy (EF <20%), CHF here with three day h/o increasing shortness of breath with exertion, activity and unable to lie down to sleep. Denies fever, chills, cough, fever, chills. She has been out of her medicines (including lasix) for several days.  Patient is a 50 y.o. female presenting with shortness of breath and leg pain. The history is provided by the patient.  Shortness of Breath  The current episode started 3 to 5 days ago. The problem occurs frequently. The problem has been gradually worsening. The problem is moderate. The symptoms are relieved by nothing. Associated symptoms include orthopnea and shortness of breath. Pertinent negatives include no chest pain, no fever, no rhinorrhea, no sore throat and no cough. She has had intermittent steroid use. She has had prior hospitalizations. She has had prior ICU admissions. She has had no prior intubations. Her past medical history is significant for asthma and past wheezing. Urine output has been normal. Recently, medical care has been given by the PCP.  Leg Pain  The incident occurred yesterday. There was no injury mechanism. The pain is present in the left leg. The pain is at a severity of 3/10. The pain is mild. The pain has been constant since onset. She has tried nothing for the symptoms.    Past Medical History  Diagnosis Date  . Hypertension     09/2010-normal CMet and CBC; Lipid profile-116, 88, 25, 73  . Cardiomyopathy 08/2010    Presented with congestive heart failure  . Gastroesophageal reflux disease   . Hypothyroidism     Recent TSH was normal.  . Pneumonia   . Obesity   . CHF (congestive heart failure)     History reviewed. No pertinent past surgical history.  Family History  Problem Relation Age of Onset  . Cardiomyopathy  Mother     ICD pacemaker-ischmic CM  . Heart failure Mother   . Hypertension Mother   . Cardiomyopathy Father     Deceased  . Coronary artery disease Father   . Heart failure Father     History  Substance Use Topics  . Smoking status: Former Smoker -- 1.0 packs/day for 20 years    Types: Cigarettes    Quit date: 09/03/2010  . Smokeless tobacco: Not on file  . Alcohol Use: No    OB History    Grav Para Term Preterm Abortions TAB SAB Ect Mult Living                  Review of Systems  Constitutional: Positive for activity change and fatigue. Negative for fever.  HENT: Negative for sore throat and rhinorrhea.   Eyes: Positive for discharge.  Respiratory: Positive for shortness of breath. Negative for cough and chest tightness.   Cardiovascular: Positive for orthopnea and leg swelling. Negative for chest pain and palpitations.  All other systems reviewed and are negative.    Physical Exam  BP 102/73  Pulse 90  Temp(Src) 98.1 F (36.7 C) (Oral)  Resp 20  Ht 5\' 7"  (1.702 m)  Wt 265 lb (120.203 kg)  BMI 41.50 kg/m2  SpO2 97%  Physical Exam  Nursing note and vitals reviewed. Constitutional: She is oriented to person, place, and time. She appears well-developed and well-nourished. She appears distressed.  HENT:  Head: Normocephalic.  Right  Ear: External ear normal.  Left Ear: External ear normal.  Nose: Nose normal.  Mouth/Throat: Oropharynx is clear and moist.  Eyes: EOM are normal. Pupils are equal, round, and reactive to light.  Neck: Normal range of motion. JVD present.  Cardiovascular: Intact distal pulses.   Murmur heard.      tachycardia  Pulmonary/Chest: She has wheezes. She has rales.  Abdominal: Soft. Bowel sounds are normal. There is tenderness. There is no rebound and no guarding.       RLQ pain  Genitourinary: Vagina normal.  Musculoskeletal: Normal range of motion.  Neurological: She is alert and oriented to person, place, and time.  Skin: Skin  is warm and dry.    ED Course  Procedures  MDM  Date: 10/31/2010 0347  Rate:105  Rhythm: sinus tachycardia  QRS Axis: left  Intervals: normal  ST/T Wave abnormalities: normal  Conduction Disutrbances:none  Narrative Interpretation:   Old EKG Reviewed: no PVCs on current EKG compared with EKG of 09/23/10  Patient with h/o CHF who presents with SOB, has +JVD, in acute respiratory distress. Placed on O2 and given nebulized treatment. Able to diurese >1 liter with lasix. Patient with recent hospitalization 09/23/10.for CHF. Currnent labs with evidence of CHG, BNP  Elevated c/w CHF. Patient given lasix with good diuresis. Patient / Family / Caregiver understand and agree with initial ED impression and plan with expectations set for ED visit.Patient feels she needs to be admitted as she has not been able to rest comfortably for days. Results for orders placed during the hospital encounter of 10/31/10  PRO B NATRIURETIC PEPTIDE      Component Value Range   BNP, POC 1978.0 (*) 0 - 125 (pg/mL)  CBC      Component Value Range   WBC 9.3  4.0 - 10.5 (K/uL)   RBC 4.68  3.87 - 5.11 (MIL/uL)   Hemoglobin 12.3  12.0 - 15.0 (g/dL)   HCT 65.7  84.6 - 96.2 (%)   MCV 83.5  78.0 - 100.0 (fL)   MCH 26.3  26.0 - 34.0 (pg)   MCHC 31.5  30.0 - 36.0 (g/dL)   RDW 95.2 (*) 84.1 - 15.5 (%)   Platelets 233  150 - 400 (K/uL)  DIFFERENTIAL      Component Value Range   Neutrophils Relative 63  43 - 77 (%)   Neutro Abs 5.9  1.7 - 7.7 (K/uL)   Lymphocytes Relative 28  12 - 46 (%)   Lymphs Abs 2.6  0.7 - 4.0 (K/uL)   Monocytes Relative 6  3 - 12 (%)   Monocytes Absolute 0.5  0.1 - 1.0 (K/uL)   Eosinophils Relative 3  0 - 5 (%)   Eosinophils Absolute 0.3  0.0 - 0.7 (K/uL)   Basophils Relative 1  0 - 1 (%)   Basophils Absolute 0.1  0.0 - 0.1 (K/uL)  BASIC METABOLIC PANEL      Component Value Range   Sodium 134 (*) 135 - 145 (mEq/L)   Potassium 4.0  3.5 - 5.1 (mEq/L)   Chloride 100  96 - 112 (mEq/L)   CO2  23  19 - 32 (mEq/L)   Glucose, Bld 99  70 - 99 (mg/dL)   BUN 15  6 - 23 (mg/dL)   Creatinine, Ser 3.24  0.50 - 1.10 (mg/dL)   Calcium 9.6  8.4 - 40.1 (mg/dL)   GFR calc non Af Amer >60  >60 (mL/min)   GFR calc Af Amer >60  >  60 (mL/min)   Dg Chest Portable 1 View  10/31/2010  *RADIOLOGY REPORT*  Clinical Data: Shortness of breath; history of congestive heart failure.  PORTABLE CHEST - 1 VIEW  Comparison: Chest radiograph performed 09/24/2010  Findings: The lungs are well-aerated.  Increased interstitial markings are noted, with mild bibasilar opacities and vascular congestion; these findings appear stable from multiple prior studies, raising question for chronic interstitial changes versus recurrent edema.  No definite pleural effusion or pneumothorax is seen.  The cardiomediastinal silhouette is mildly enlarged.  No acute osseous abnormalities are seen.  IMPRESSION: Increased interstitial markings, with mild bibasilar opacities, vascular congestion and mild cardiomegaly; these findings appear stable from multiple prior studies, raising question for chronic interstitial changes versus recurrent pulmonary edema.  Original Report Authenticated By: Tonia Ghent, M.D.  MDM Reviewed: previous chart, nursing note and vitals Reviewed previous: labs, ECG and x-ray Interpretation: labs, ECG and x-ray Total time providing critical care: 30-74 minutes. This excludes time spent performing separately reportable procedures and services.       Nicoletta Dress. [place], MD 10/31/10 [phone]

## 2010-10-31 NOTE — Consult Note (Signed)
[facility] Spoke with Dr. Angela Nevin, hospitalist. Will admit to telemetry. Temporary admit orders will be done.

## 2010-11-01 DIAGNOSIS — I5023 Acute on chronic systolic (congestive) heart failure: Secondary | ICD-10-CM

## 2010-11-01 LAB — CBC
Platelets: 247 10*3/uL (ref 150–400)
RBC: 4.75 MIL/uL (ref 3.87–5.11)
RDW: 16.7 % — ABNORMAL HIGH (ref 11.5–15.5)
WBC: 9.2 10*3/uL (ref 4.0–10.5)

## 2010-11-01 LAB — BASIC METABOLIC PANEL
CO2: 24 mEq/L (ref 19–32)
Calcium: 9.4 mg/dL (ref 8.4–10.5)
GFR calc Af Amer: >60 mL/min (ref >60)
GFR calc non Af Amer: >60 mL/min (ref >60)
Sodium: 135 mEq/L (ref 135–145)

## 2010-11-01 LAB — LIPID PANEL
Cholesterol: 193 mg/dL (ref 0–200)
HDL: 38 mg/dL — ABNORMAL LOW (ref >39)
Total CHOL/HDL Ratio: 5.1 RATIO
Triglycerides: 192 mg/dL — ABNORMAL HIGH (ref <150)

## 2010-11-01 MED ORDER — FUROSEMIDE 10 MG/ML IJ SOLN
40.0000 mg | Freq: Two times a day (BID) | INTRAMUSCULAR | Status: DC
  Administered 2010-11-01 – 2010-11-02 (×3): 40 mg via INTRAVENOUS
  Filled 2010-11-01 (×3): qty 4

## 2010-11-01 NOTE — Progress Notes (Signed)
Subjective: States she feels a little better today, is lying down flat in bed which she was unable to do yesterday.  Objective: Vital signs in last 24 hours: Temp:  [97.6 F (36.4 C)-98.5 F (36.9 C)] 97.6 F (36.4 C) (08/20 0700) Pulse Rate:  [41-102] 41  (08/20 0700) Resp:  [14-25] 18  (08/20 0700) BP: (81-122)/(54-97) 103/74 mmHg (08/20 0600) SpO2:  [91 %-100 %] 100 % (08/20 0700) FiO2 (%):  [2 %] 2 % (08/19 1300) Weight change:  Last BM Date: 10/31/10  Intake/Output from previous day: 08/19 0701 - 08/20 0700 In: 2745 [P.O.:1313; I.V.:1410; IV Piggyback:22] Out: 1477 [Urine:1475; Stool:2]   Physical Exam: General: Alert, awake, oriented x3, in no acute distress. HEENT: No bruits, no goiter. Heart: Bibasilar crackles, no wheezes. Lungs: Clear to auscultation bilaterally. Abdomen: Soft, nontender, nondistended, positive bowel sounds. Extremities: 3+ edema up to her mid thigh bilateral Neuro: Grossly intact, nonfocal.    Lab Results:  [facility] 11/01/10 0457 10/31/10 0435  WBC 9.2 9.3  HGB 12.4 12.3  HCT 39.8 39.1  PLT 247 233    Basename 11/01/10 0457 10/31/10 0435  NA 135 134*  K 4.3 4.0  CL 100 100  CO2 24 23  GLUCOSE 108* 99  BUN 19 15  CREATININE 0.87 0.82  CALCIUM 9.4 9.6    Studies/Results: Dg Chest Portable 1 View  10/31/2010  *RADIOLOGY REPORT*  Clinical Data: Shortness of breath; history of congestive heart failure.  PORTABLE CHEST - 1 VIEW  Comparison: Chest radiograph performed 09/24/2010  Findings: The lungs are well-aerated.  Increased interstitial markings are noted, with mild bibasilar opacities and vascular congestion; these findings appear stable from multiple prior studies, raising question for chronic interstitial changes versus recurrent edema.  No definite pleural effusion or pneumothorax is seen.  The cardiomediastinal silhouette is mildly enlarged.  No acute osseous abnormalities are seen.  IMPRESSION: Increased interstitial markings,  with mild bibasilar opacities, vascular congestion and mild cardiomegaly; these findings appear stable from multiple prior studies, raising question for chronic interstitial changes versus recurrent pulmonary edema.  Original Report Authenticated By: Tonia Ghent, M.D.    Medications: Scheduled Meds:   . sodium chloride   Intravenous STAT  . enoxaparin  40 mg Subcutaneous Q24H  . ferrous gluconate  324 mg Oral Q breakfast  . furosemide  40 mg Intravenous Q12H  . levothyroxine  336 mcg Oral Daily  . lisinopril  2.5 mg Oral Daily  . metoprolol tartrate  12.5 mg Oral BID  . multivitamins ther. w/minerals  1 tablet Oral Daily  . pantoprazole  40 mg Oral Q1200  . rosuvastatin  20 mg Oral Daily  . sodium chloride  250 mL Intravenous Once  . sodium chloride  3 mL Intravenous Q12H  . DISCONTD: furosemide  20 mg Intravenous BID   Continuous Infusions:   . DISCONTD: sodium chloride 250 mL (11/01/10 0400)   PRN Meds:.acetaminophen, acetaminophen, albuterol, diphenhydrAMINE, ibuprofen, ondansetron (ZOFRAN) IV, ondansetron, oxyCODONE, senna, sodium chloride  Assessment/Plan:  1.  Systolic CHF, acute: Agree with increasing Lasix dose to obtain negative fluid balance. Continue aspirin, statin, lisinopril, metoprolol. Have spoken with case management about making sure that she has adequate medications upon discharge. She has apparently been receiving fluids at 50 cc an hour despite no order for it. This has been discussed with patient's RN who will discontinue fluids immediately. 2.  Hypotension: Blood pressure ranges overnight between 70 and 120. We'll keep in ICU today. 3.  Hypertension: Hypotensive on admission. See  above for details. 4.  Morbid obesity 5.  Gastroesophageal reflux disease 6.  Hypothyroidism: TSH on admission is surprisingly very elevated at 104. This is certainly not consistent with her not being on Synthroid for only 4 days. When patient is questioned she states that in fact  she has been out of Synthroid for over 2-1/2 months. Her Synthroid has already been restarted , TSH will need to be checked in 4-6 weeks. This degree of hypothyroidism could certainly have played a role in her current symptoms. 7. DVT prophylaxis: Lovenox   LOS: 1 day   HERNANDEZ ACOSTA,ESTELA 11/01/2010, 9:54 AM

## 2010-11-01 NOTE — Consult Note (Signed)
CARDIOLOGY CONSULT NOTE  Patient ID: Melanie Cordova MRN: [id number] DOB/AGE: 49- Sep-1962 49 y.o.  Admit date: 10/31/2010 Referring Physician: Ardyth Cordova [facility]) Primary Physician: None listed Primary Cardiologist: Melanie Cordova Reason for Consultation:  CHF  HPI: 50 y/o female patient of Melanie Cordova, and Melanie Cordova we are asked to see for CHF exacerbation. She was admitted in July of 2012 for same. She was found to have EF of 15%-20%.  At that time she was diuresed and was to be treated medically.  There was a discussion about having cardiac catheterization to assess ischemia causing systolic dysfx,.  Also discussion concerning possible ICD placement. At the time of the last admission, Melanie Cordova wanted medical management only with outpatient follow-up for further discussion of treatment options. He saw her on 10/22/10 and she was doing well.  She was continued on current medications.   She ran out of her medications 4 days ago. She began to have increased dyspnea and edema.  She was treated with IV lasix , 80 mg, inhalers and morphine. She did not have overt chest pain, but some pressure was noted. She states she does not qualify for medicaid because she works.  She was seen by social services but states did not receive information about what programs were available.  It is noted that TSH on admission 104.1   Review of systems complete and found to be negative unless listed above  Past Medical History  Diagnosis Date  . Hypertension     09/2010-normal CMet and CBC; Lipid profile-116, 88, 25, 73  . Cardiomyopathy 08/2010    Presented with congestive heart failure  . Gastroesophageal reflux disease   . Hypothyroidism     Recent TSH was normal.  . Pneumonia   . Obesity   . CHF (congestive heart failure)     Family History  Problem Relation Age of Onset  . Cardiomyopathy Mother     ICD pacemaker-ischmic CM  . Heart failure Mother   . Hypertension Mother   . Cardiomyopathy Father     Deceased  .  Coronary artery disease Father   . Heart failure Father     History   Social History  . Marital Status: Single    Spouse Name: N/A    Number of Children: 2  . Years of Education: N/A   Occupational History  .      Works in Science writer   Social History Main Topics  . Smoking status: Current Everyday Smoker -- 0.5 packs/day for 20 years    Types: Cigarettes    Last Attempt to Quit: 09/03/2010  . Smokeless tobacco: Not on file  . Alcohol Use: No  . Drug Use: No  . Sexually Active: No   Other Topics Concern  . Not on file   Social History Narrative  . No narrative on file    History reviewed. No pertinent past surgical history.   Prescriptions prior to admission  Medication Sig Dispense Refill  . Albuterol Sulfate (VENTOLIN HFA IN) Inhale 2 puffs into the lungs every 4 (four) hours as needed. Shortness of breath       . Calcium Carbonate-Vitamin D (CALTRATE 600+D) 600-400 MG-UNIT per chew tablet Chew 1 tablet by mouth 2 (two) times daily with a meal.        . ferrous gluconate (FERGON) 324 MG tablet Take 324 mg by mouth daily with breakfast.        . furosemide (LASIX) 40 MG tablet Take 80 mg by mouth daily. Call  for increased weight       . levothyroxine (SYNTHROID, LEVOTHROID) 112 MCG tablet Take 336 mcg by mouth daily.       Marland Kitchen loratadine (CLARITIN REDITABS) 10 MG dissolvable tablet Take 10 mg by mouth daily.        . metoprolol (TOPROL-XL) 50 MG 24 hr tablet Take 50 mg by mouth daily.        . Multiple Vitamins-Minerals (CENTRUM SILVER ULTRA WOMENS PO) Take 1 tablet by mouth daily.        . potassium chloride SA (K-DUR,KLOR-CON) 20 MEQ tablet Take 2 tablets (40 mEq total) by mouth 2 (two) times daily.  120 tablet  0  . ranitidine (ZANTAC) 150 MG tablet Take 150 mg by mouth 2 (two) times daily as needed. For gastric reflux       . rosuvastatin (CRESTOR) 20 MG tablet Take 20 mg by mouth daily.        . quinapril (ACCUPRIL) 10 MG tablet Take 10 mg by mouth at bedtime.           Physical Exam: Blood pressure 103/74, pulse 41, temperature 97.6 F (36.4 C), temperature source Oral, resp. rate 18, height 5\' 7"  (1.702 m), weight 265 lb (120.203 kg), SpO2 100.00%.   General: Well developed, well nourished, in no acute distress Head: Eyes PERRLA, No xanthomas.   Normal cephalic and atramatic  Lungs: Bilateral crackles but no wheezes or rhonchi Heart: HRRR S1 S2, occasional irregularity.  Pulses are 2+ & equal.            No carotid bruit. No JVD.  No abdominal bruits. No femoral bruits. Abdomen: Bowel sounds are positive, abdomen soft and non-tender without masses or                  Hernia's noted. Msk:  Back normal, normal gait. Normal strength and tone for age. Extremities: No clubbing, cyanosis  1+edema.  DP +1 Neuro: Alert and oriented X 3. Psych:  Good affect, responds appropriately Labs:   Lab Results  Component Value Date   WBC 9.2 11/01/2010   HGB 12.4 11/01/2010   HCT 39.8 11/01/2010   MCV 83.8 11/01/2010   PLT 247 11/01/2010     Lab 11/01/10 0457  NA 135  K 4.3  CL 100  CO2 24  BUN 19  CREATININE 0.87  CALCIUM 9.4  PROT --  BILITOT --  ALKPHOS --  ALT --  AST --  GLUCOSE 108*   Lab Results  Component Value Date   CKTOTAL 52 09/24/2010   CKMB 2.0 09/24/2010   TROPONINI <0.30 09/24/2010    Lab Results  Component Value Date   CHOL 116 09/25/2010   Lab Results  Component Value Date   HDL 25* 09/25/2010   Lab Results  Component Value Date   LDLCALC 73 09/25/2010   Lab Results  Component Value Date   TRIG 88 09/25/2010   Lab Results  Component Value Date   CHOLHDL 4.6 09/25/2010    Left ventricle: Melanie Cordova thickness was increased in a pattern of moderate LVH. There was mild concentric hypertrophy. Systolic function was severely reduced. The estimated ejection fraction was in the range of 15% to 20%. Severe diffuse hypokinesis. - Mitral valve: Mild regurgitation. - Left atrium: The atrium was moderately to severely dilated. -  Right ventricle: The cavity size was mildly to moderately dilated. Melanie Cordova thickness was normal. Systolic function was moderately reduced. - Right atrium: The atrium was moderately to severely dilated. -  Atrial septum: No defect or patent foramen ovale was identified.     Radiology: Chest X-Ray:  Increased interstitial markings, with mild bibasilar opacities, vascular congestion and mild cardiomegaly; these findings appear stable from multiple prior studies, raising question for chronic interstitial changes versus recurrent pulmonary edema.    ECHO( 09/24/10) Left ventricle: Ashton Sabine thickness was increased in a pattern of moderate LVH. There was mild concentric hypertrophy. Systolic function was severely reduced. The estimated ejection fraction was in the range of 15% to 20%. Severe diffuse hypokinesis. - Mitral valve: Mild regurgitation. - Left atrium: The atrium was moderately to severely dilated. - Right ventricle: The cavity size was mildly to moderately dilated. Janaiya Beauchesne thickness was normal. Systolic function was moderately reduced. - Right atrium: The atrium was moderately to severely dilated. - Atrial septum: No defect or patent foramen ovale was identified.   ASSESSMENT AND PLAN:   1. Acute on chronic systolic CHF: This is in part related to medical noncompliance in the setting of unable to obtain medications.  She ran out 4 days prior to symptoms which required ER evaluation.  She has diuresed 2600 cc initially on 80 mg of IV lasix. She is now on 20 mg IV BID.  Wt on discharge 285 lbs., wt on admission 265 lbs.  Will increase lasix to 40mg  IV BID, d/c IV fluids. Continue to monitor response. She well need a cardiac catheterization to have thorough evaluation of cardiac anatomy.   2. Medical noncompliance:  She will need to have re-evaluation by social services. Possible program assistance.  3. Hypothyroidism:  TSH is very elevated. Inconsistent with stopping levothyroxine for only  4 days. PCP in hospital is repeating this. She may not have been taking it at all.  Uncertain. This could have exacerbated this episode of heart failure as well.  More recommendations per Dr. Daleen Squibb.  Please see his note. Signed: Bettey Mare. Lawrence NP 11/01/2010, 9:11 AM Co-Sign MD History, PE and laboratory data reviewed. Noncompliance is reason for failed therapy and readmission. We have directly involved Case Management, will arrange Cardiology followup within 7 days post discharge with Joni Reining NP. Patient will also be referred to the [facility] for RX help. She understands diet and daily weights. Needs reduced stress of supporting daughter.  Juanito Doom MD [facility]

## 2010-11-01 NOTE — Progress Notes (Signed)
INITIAL ADULT NUTRITION ASSESSMENT Date: 11/01/2010   Time: 1:04 PM Reason for Assessment: CHF, low sodium diet, wt loss education request  ASSESSMENT: Female 50 y.o.  Dx: Systolic CHF, acute  Hx:  Past Medical History  Diagnosis Date  . Hypertension     09/2010-normal CMet and CBC; Lipid profile-116, 88, 25, 73  . Cardiomyopathy 08/2010    Presented with congestive heart failure  . Gastroesophageal reflux disease   . Hypothyroidism     Recent TSH was normal.  . Pneumonia   . Obesity   . CHF (congestive heart failure)    Related Meds:  Scheduled Meds:   . sodium chloride   Intravenous STAT  . enoxaparin  40 mg Subcutaneous Q24H  . ferrous gluconate  324 mg Oral Q breakfast  . furosemide  40 mg Intravenous Q12H  . levothyroxine  336 mcg Oral Daily  . lisinopril  2.5 mg Oral Daily  . metoprolol tartrate  12.5 mg Oral BID  . multivitamins ther. w/minerals  1 tablet Oral Daily  . pantoprazole  40 mg Oral Q1200  . rosuvastatin  20 mg Oral Daily  . sodium chloride  3 mL Intravenous Q12H  . DISCONTD: furosemide  20 mg Intravenous BID   Continuous Infusions:   . DISCONTD: sodium chloride 250 mL (11/01/10 0400)   PRN Meds:.acetaminophen, acetaminophen, albuterol, diphenhydrAMINE, ibuprofen, ondansetron (ZOFRAN) IV, ondansetron, oxyCODONE, senna, sodium chloride  Ht: 5\' 7"  (170.2 cm)  Wt: 265 lb (120.203 kg)  Ideal Wt: 61.6 kg 66.1 kg % Ideal Wt: 196.3%  Usual Wt: 190-200# (pt reports when thyroid is under control) % Usual Wt: 132.5-139.5%  Body mass index is 41.50 kg/(m^2).  Food/Nutrition Related Hx: Pt was eating lunch and daughter was present. Pt reports good appetite today but had some issues last night and this morning due to GERD. Pt thinks the cabbage she ate may have caused the discomfort. Pt reports a usual diet low in sodium and overall healthful diet. Pt's usual intake includes multi-grain cereal with banana or oatmeal with fruit for breakfast, peanut butter  and banana sandwich or grilled chicken sandwich and sun chips for lunch, a protein grilled or baked with vegetables for dinner. She reports 1-2 snacks per day which consists of fruit, trail mix, or triscuits. 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 Pt reports a UBW of 190-200# when thyroid levels are under control and recent wt loss of 35# in the past 2 months. Pt is currently 265# BMI 41.5, morbidly obese. Discussed the importance of wt loss for better heart health. Also provided handouts on the DASH diet for hypertension and information on low sodium diet for CHF. If followed, these diets should promote wt loss. Recommended healthy wt loss of 1-2 # per week.Pt understands importance of taking daily wts for CHF and also aware of physical activity limits/recommendations.  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 Pt reports walking to work regularly but will limit to 3x/wk and will request rides home if feeling tired.   Labs: Results for CORALEIGH, SHEERAN (MRN [id number]) as of 11/01/2010 13:38  Ref. Range 10/31/2010 03:57 10/31/2010 04:34 10/31/2010 04:35 10/31/2010 13:47 11/01/2010 04:57  Sodium Latest Range: 135-145 mEq/L   134 (L)  135  Potassium Latest Range: 3.5-5.1 mEq/L   4.0  4.3  Chloride Latest Range: 96-112 mEq/L   100  100  CO2 Latest  Range: 19-32 mEq/L   23  24  BUN Latest Range: 6-23 mg/dL   15  19  Creat Latest Range: 0.50-1.10 mg/dL   1.61  0.96  Calcium Latest Range: 8.4-10.5 mg/dL   9.6  9.4  GFR calc non Af Amer Latest Range: >60 mL/min   >60  >60  GFR calc Af Amer Latest Range: >60 mL/min   >60  >60  Glucose Latest Range: 70-99 mg/dL   99  045 (H)  WBC Latest Range: 4.0-10.5 K/uL   9.3  9.2  RBC Latest Range: 3.87-5.11 MIL/uL   4.68  4.75  HGB Latest Range: 12.0-15.0 g/dL   40.9  81.1  HCT Latest Range: 36.0-46.0 %   39.1  39.8  MCV Latest Range: 78.0-100.0 fL    83.5  83.8  MCH Latest Range: 26.0-34.0 pg   26.3  26.1  MCHC Latest Range: 30.0-36.0 g/dL   91.4  78.2  RDW Latest Range: 11.5-15.5 %   16.5 (H)  16.7 (H)  Platelets Latest Range: 150-400 K/uL   233  247  Neutrophils Relative Latest Range: 43-77 %   63    Lymphocytes Relative Latest Range: 12-46 %   28    Monocytes Relative Latest Range: 3-12 %   6    Eosinophils Relative Latest Range: 0-5 %   3    Basophils Relative Latest Range: 0-1 %   1    Neutrophils Absolute Latest Range: 1.7-7.7 K/uL   5.9    Lymphocytes Absolute Latest Range: 0.7-4.0 K/uL   2.6    Monocytes Absolute Latest Range: 0.1-1.0 K/uL   0.5    Eosinophils Absolute Latest Range: 0.0-0.7 K/uL   0.3    Basophils Absolute Latest Range: 0.0-0.1 K/uL   0.1    TSH Latest Range: 0.350-4.500 uIU/mL   104.166 (H)    MRSA by PCR Latest Range: NEGATIVE     NEGATIVE   BNP, POC Latest Range: 0-125 pg/mL  1978.0 (H)     DG CHEST PORTABLE 1 VIEW No range found Rpt        Intake/Output Summary (Last 24 hours) at 11/01/10 1339 Last data filed at 11/01/10 0400  Gross per 24 hour  Intake   1412 ml  Output    602 ml  Net    810 ml   Diet Order: Cardiac Heart Healthy  Supplements/Tube Feeding: None at this time  IVF:    DISCONTD: sodium chloride Last Rate: 250 mL (11/01/10 0400)    Estimated Nutritional Needs:   Kcal: 1600-1800 Protein: 80-90 gm /day Fluid: 1600-1800 mL /day  NUTRITION DIAGNOSIS: Decreased sodium needs. Status- ongoing.  RELATED TO: CHF  AS EVIDENCE BY: edema and fluid retention.  MONITORING/EVALUATION(Goals): - To cont current po intake of 100% 2 gm sodium diet. - Will monitor labs, intake, and wt.   EDUCATION NEEDS: -Education needs addressed  INTERVENTION: - Provided nutr education and handouts on low sodium diet for CHF and tips for wt loss.   Dietitian [phone]  DOCUMENTATION CODES Per approved criteria  -Morbid Obesity    Melanie Cordova 11/01/2010, 1:04 PM

## 2010-11-02 LAB — BASIC METABOLIC PANEL
BUN: 17 mg/dL (ref 6–23)
CO2: 28 mEq/L (ref 19–32)
Calcium: 8.9 mg/dL (ref 8.4–10.5)
GFR calc non Af Amer: >60 mL/min (ref >60)
Glucose, Bld: 94 mg/dL (ref 70–99)

## 2010-11-02 MED ORDER — FUROSEMIDE 10 MG/ML IJ SOLN
60.0000 mg | Freq: Two times a day (BID) | INTRAMUSCULAR | Status: DC
  Administered 2010-11-02 – 2010-11-03 (×2): 60 mg via INTRAVENOUS
  Filled 2010-11-02: qty 6
  Filled 2010-11-02: qty 4
  Filled 2010-11-02: qty 6

## 2010-11-02 MED ORDER — POTASSIUM CHLORIDE CRYS ER 20 MEQ PO TBCR
40.0000 meq | EXTENDED_RELEASE_TABLET | Freq: Once | ORAL | Status: AC
  Administered 2010-11-02: 40 meq via ORAL
  Filled 2010-11-02: qty 2

## 2010-11-02 NOTE — Progress Notes (Signed)
   Subjective: States she feels a little better today, is lying down flat in bed. Says she had to take 2 pain pills yesterday for her lower extremity pain.  Objective: Vital signs in last 24 hours: Temp:  [97.5 F (36.4 C)-99 F (37.2 C)] 97.9 F (36.6 C) (08/21 1400) Pulse Rate:  [75-91] 82  (08/21 1400) Resp:  [17-22] 18  (08/21 1400) BP: (83-110)/(55-81) 83/55 mmHg (08/21 1400) SpO2:  [94 %-99 %] 96 % (08/21 1400) Weight:  [124.9 kg (275 lb 5.7 oz)-125 kg (275 lb 9.2 oz)] 275 lb 5.7 oz (124.9 kg) (08/21 0351) Weight change:  Last BM Date: 10/31/10  Intake/Output from previous day: 08/20 0701 - 08/21 0700 In: 483 [P.O.:480; I.V.:3] Out: -    Physical Exam: General: Alert, awake, oriented x3, in no acute distress. HEENT: No bruits, no goiter. Heart: Bibasilar crackles, no wheezes. Lungs: Clear to auscultation bilaterally. Abdomen: Soft, nontender, nondistended, positive bowel sounds. Extremities: 3+ edema up to her knees bilaterally Neuro: Grossly intact, nonfocal.    Lab Results:  [facility] 11/01/10 0457 10/31/10 0435  WBC 9.2 9.3  HGB 12.4 12.3  HCT 39.8 39.1  PLT 247 233    Basename 11/02/10 0552 11/01/10 0457  NA 138 135  K 3.4* 4.3  CL 99 100  CO2 28 24  GLUCOSE 94 108*  BUN 17 19  CREATININE 0.81 0.87  CALCIUM 8.9 9.4    Studies/Results: No results found.  Medications: Scheduled Meds:    . enoxaparin  40 mg Subcutaneous Q24H  . ferrous gluconate  324 mg Oral Q breakfast  . furosemide  60 mg Intravenous Q12H  . levothyroxine  336 mcg Oral Daily  . lisinopril  2.5 mg Oral Daily  . metoprolol tartrate  12.5 mg Oral BID  . multivitamins ther. w/minerals  1 tablet Oral Daily  . pantoprazole  40 mg Oral Q1200  . potassium chloride  40 mEq Oral Once  . rosuvastatin  20 mg Oral Daily  . sodium chloride  3 mL Intravenous Q12H  . DISCONTD: furosemide  40 mg Intravenous Q12H   Continuous Infusions:  PRN Meds:.acetaminophen, acetaminophen,  albuterol, diphenhydrAMINE, ibuprofen, ondansetron (ZOFRAN) IV, ondansetron, oxyCODONE, senna, sodium chloride  Assessment/Plan:  1.  Systolic CHF, acute: Will increase Lasix dose to 60 mg twice daily to obtain negative fluid balance. I suspect that her ins and outs are not been accurately recorded. Continue aspirin, statin, lisinopril, metoprolol. Have spoken with case management about making sure that she has adequate medications upon discharge. We'll check a chest x-ray in the morning. 2. Hypertension: Hypotensive on admission. That has now resolved. Continue current medications. 3.  Morbid obesity 4.  Gastroesophageal reflux disease 5.  Hypothyroidism: TSH on admission is surprisingly very elevated at 104. This is certainly not consistent with her not being on Synthroid for only 4 days. When patient is questioned she states that in fact she has been out of Synthroid for over 2-1/2 months. Her Synthroid has already been restarted , TSH will need to be checked in 4-6 weeks. This degree of hypothyroidism could certainly have played a role in her current symptoms. 6. DVT prophylaxis: Lovenox   LOS: 2 days   Melanie Cordova,Melanie Cordova 11/02/2010, 3:45 PM

## 2010-11-02 NOTE — Progress Notes (Signed)
SUBJECTIVE: She is feeling better today, breathing status is improved. Frustrated with meds assistance as outpatient.   Filed Vitals:   11/01/10 2243 11/01/10 2311 11/02/10 0351 11/02/10 0443  BP: 93/74   110/71  Pulse:    75  Temp: 98.8 F (37.1 C)   97.5 F (36.4 C)  TempSrc: Oral   Oral  Resp: 22   18  Height:  5\' 7"  (1.702 m)    Weight:  275 lb 9.2 oz (125 kg) 275 lb 5.7 oz (124.9 kg)   SpO2: 94%   99%    Intake/Output Summary (Last 24 hours) at 11/02/10 0856 Last data filed at 11/02/10 0600  Gross per 24 hour  Intake    483 ml  Output      0 ml  Net    483 ml    LABS: Basic Metabolic Panel:  Basename 11/02/10 0552 11/01/10 0457  NA 138 135  K 3.4* 4.3  CL 99 100  CO2 28 24  GLUCOSE 94 108*  BUN 17 19  CREATININE 0.81 0.87  CALCIUM 8.9 9.4  MG -- --  PHOS -- --   Liver Function Tests: No results found for this basename: AST:2,ALT:2,ALKPHOS:2,BILITOT:2,PROT:2,ALBUMIN:2 in the last 72 hours No results found for this basename: LIPASE:2,AMYLASE:2 in the last 72 hours CBC:  Basename 11/01/10 0457 10/31/10 0435  WBC 9.2 9.3  NEUTROABS -- 5.9  HGB 12.4 12.3  HCT 39.8 39.1  MCV 83.8 83.5  PLT 247 233   Cardiac Enzymes: No results found for this basename: CKTOTAL:3,CKMB:3,CKMBINDEX:3,TROPONINI:3 in the last 72 hours BNP:  [facility] 10/31/10 0434  POCBNP 1978.0*   D-Dimer: No results found for this basename: DDIMER:2 in the last 72 hours Hemoglobin A1C: No results found for this basename: HGBA1C in the last 72 hours Fasting Lipid Panel:  Basename 11/01/10 0459  CHOL 193  HDL 38*  LDLCALC 117*  TRIG 192*  CHOLHDL 5.1  LDLDIRECT --   Thyroid Function Tests:  Basename 10/31/10 0435  TSH 104.166*  T4TOTAL --  T3FREE --  THYROIDAB --   Anemia Panel: No results found for this basename: VITAMINB12,FOLATE,FERRITIN,TIBC,IRON,RETICCTPCT in the last 72 hours  RADIOLOGY: Dg Chest Portable 1 View  10/31/2010  *RADIOLOGY REPORT*    IMPRESSION:  Increased interstitial markings, with mild bibasilar opacities, vascular congestion and mild cardiomegaly; these findings appear stable from multiple prior studies, raising question for chronic interstitial changes versus recurrent pulmonary edema.  Original Report Authenticated By: Tonia Ghent, M.D.    PHYSICAL EXAM General: Well developed, well nourished, in no acute distress Head: Eyes PERRLA, No xanthomas.   Normal cephalic and atramatic  Lungs: Clear bilaterally to auscultation and percussion. Heart: HRRR S1 S2, with soft S4 murmur.  Pulses are 2+ & equal.            No carotid bruit. No JVD.  No abdominal bruits. No femoral bruits. Abdomen: Bowel sounds are positive, abdomen soft and non-tender without masses or                  Hernia's noted. Msk:  Back normal, normal gait. Normal strength and tone for age. Extremities: No clubbing, cyanosis or edema.  DP +1 Neuro: Alert and oriented X 3. Psych:  Good affect, responds appropriately   ASSESSMENT AND PLAN:  Principal Problem:  *Systolic CHF, acute Active Problems:  Morbid obesity  Hypertension  Gastroesophageal reflux disease  Hypothyroidism  Hypotension   1. Acute on chronic systolic CHF: This is in part related to  medical noncompliance in the setting of unable to obtain medications. She ran out 4 days prior to symptoms which required ER evaluation. She has diuresed 2600 cc initially on 80 mg of IV lasix. She is now on 20 mg IV BID. Wt on discharge 285 lbs., wt on admission 265 lbs. Will increase lasix to 40mg  IV BID, d/c IV fluids. Continue to monitor response. Would still consider catheterization for further evaluation of coronary anatomy.  She will have close follow-up in the office post discharge- November 11, 2010.  This is placed in discharge follow-up appointment schedule. 2. Medical noncompliance: She is being evaluated by social services. Possible program assistance.  3. Hypothyroidism: TSH is very elevated. Inconsistent  with stopping levothyroxine for only 4 days. PCP in hospital is repeating this. She may not have been taking it at all. Uncertain. This could have exacerbated this episode of heart failure as well.  Melanie Cordova. Lyman Bishop NP

## 2010-11-02 NOTE — Progress Notes (Signed)
Patient has improved clinically. Reviewed data and discussed with Ms. Lawrence. Anticipate discharge with close office followup for further management and to consider additional testing options. Social service evaluation also pending.

## 2010-11-03 ENCOUNTER — Inpatient Hospital Stay (HOSPITAL_COMMUNITY): Payer: Self-pay

## 2010-11-03 LAB — CBC
HCT: 38.3 % (ref 36.0–46.0)
Hemoglobin: 11.9 g/dL — ABNORMAL LOW (ref 12.0–15.0)
MCH: 26 pg (ref 26.0–34.0)
MCHC: 31.1 g/dL (ref 30.0–36.0)
RDW: 16.5 % — ABNORMAL HIGH (ref 11.5–15.5)

## 2010-11-03 LAB — BASIC METABOLIC PANEL
BUN: 12 mg/dL (ref 6–23)
Creatinine, Ser: 0.79 mg/dL (ref 0.50–1.10)
GFR calc Af Amer: >60 mL/min (ref >60)
GFR calc non Af Amer: >60 mL/min (ref >60)
Glucose, Bld: 95 mg/dL (ref 70–99)

## 2010-11-03 MED ORDER — SPIRONOLACTONE 25 MG PO TABS: 25.0000 mg | ORAL_TABLET | Freq: Every day | ORAL | Status: DC

## 2010-11-03 MED ORDER — POTASSIUM CHLORIDE CRYS ER 20 MEQ PO TBCR: 40.0000 meq | EXTENDED_RELEASE_TABLET | Freq: Two times a day (BID) | ORAL | Status: DC

## 2010-11-03 MED ORDER — LEVOTHYROXINE SODIUM 112 MCG PO TABS: 336.0000 ug | ORAL_TABLET | Freq: Every day | ORAL | Status: DC

## 2010-11-03 MED ORDER — CARVEDILOL 3.125 MG PO TABS
3.1250 mg | ORAL_TABLET | Freq: Two times a day (BID) | ORAL | Status: DC
  Administered 2010-11-03: 3.125 mg via ORAL
  Filled 2010-11-03: qty 1

## 2010-11-03 MED ORDER — FUROSEMIDE 80 MG PO TABS: 80.0000 mg | ORAL_TABLET | Freq: Two times a day (BID) | ORAL | Status: DC

## 2010-11-03 MED ORDER — LISINOPRIL 2.5 MG PO TABS: 2.5000 mg | ORAL_TABLET | Freq: Every day | ORAL | Status: DC

## 2010-11-03 MED ORDER — SPIRONOLACTONE 25 MG PO TABS
25.0000 mg | ORAL_TABLET | Freq: Every day | ORAL | Status: DC
  Administered 2010-11-03: 25 mg via ORAL
  Filled 2010-11-03: qty 1

## 2010-11-03 MED ORDER — CARVEDILOL 12.5 MG PO TABS: 12.5000 mg | ORAL_TABLET | Freq: Two times a day (BID) | ORAL | Status: DC

## 2010-11-03 MED ORDER — CARVEDILOL 3.125 MG PO TABS: 12.5000 mg | ORAL_TABLET | Freq: Two times a day (BID) | ORAL | Status: DC

## 2010-11-03 NOTE — Discharge Summary (Signed)
Physician Discharge Summary  Patient ID: Melanie Cordova MRN: [id number] DOB/AGE: 1960-12-16 50 y.o. Primary Care Physician:No primary provider on file. Admit date: 10/31/2010 Discharge date: 11/03/2010    Discharge Diagnoses:  1. Acute systolic congestive heart failure. 2. Significant hypothyroidism secondary to noncompliance. 3. Obesity. 4. Hypertension. 5. Gastroesophageal reflux disease.   Current Discharge Medication List    START taking these medications   Details  carvedilol (COREG) 12.5 MG tablet Take 1 tablet (12.5 mg total) by mouth 2 (two) times daily with a meal. Qty: 60 tablet, Refills: 0   Associated Diagnoses: Systolic CHF, acute    lisinopril (PRINIVIL,ZESTRIL) 2.5 MG tablet Take 1 tablet (2.5 mg total) by mouth daily. Qty: 30 tablet, Refills: 0    spironolactone (ALDACTONE) 25 MG tablet Take 1 tablet (25 mg total) by mouth daily. Qty: 30 tablet, Refills: 0   Associated Diagnoses: Systolic CHF, acute      CONTINUE these medications which have CHANGED   Details  furosemide (LASIX) 80 MG tablet Take 1 tablet (80 mg total) by mouth 2 (two) times daily. Call for increased weight Qty: 60 tablet, Refills: 0    levothyroxine (SYNTHROID, LEVOTHROID) 112 MCG tablet Take 3 tablets (336 mcg total) by mouth daily. Qty: 90 tablet, Refills: 0    potassium chloride SA (K-DUR,KLOR-CON) 20 MEQ tablet Take 2 tablets (40 mEq total) by mouth 2 (two) times daily. Qty: 120 tablet, Refills: 0      CONTINUE these medications which have NOT CHANGED   Details  Albuterol Sulfate (VENTOLIN HFA IN) Inhale 2 puffs into the lungs every 4 (four) hours as needed. Shortness of breath     Calcium Carbonate-Vitamin D (CALTRATE 600+D) 600-400 MG-UNIT per chew tablet Chew 1 tablet by mouth 2 (two) times daily with a meal.      ferrous gluconate (FERGON) 324 MG tablet Take 324 mg by mouth daily with breakfast.      loratadine (CLARITIN REDITABS) 10 MG dissolvable tablet Take 10 mg by  mouth daily.      metoprolol (TOPROL-XL) 50 MG 24 hr tablet Take 50 mg by mouth daily.      Multiple Vitamins-Minerals (CENTRUM SILVER ULTRA WOMENS PO) Take 1 tablet by mouth daily.      ranitidine (ZANTAC) 150 MG tablet Take 150 mg by mouth 2 (two) times daily as needed. For gastric reflux     rosuvastatin (CRESTOR) 20 MG tablet Take 20 mg by mouth daily.        STOP taking these medications     quinapril (ACCUPRIL) 10 MG tablet         Discharged Condition: Stable and improved.    Consults: Cardiology, Dr. Dietrich Pates.  Significant Diagnostic Studies: Dg Chest Portable 1 View  10/31/2010  *RADIOLOGY REPORT*  Clinical Data: Shortness of breath; history of congestive heart failure.  PORTABLE CHEST - 1 VIEW  Comparison: Chest radiograph performed 09/24/2010  Findings: The lungs are well-aerated.  Increased interstitial markings are noted, with mild bibasilar opacities and vascular congestion; these findings appear stable from multiple prior studies, raising question for chronic interstitial changes versus recurrent edema.  No definite pleural effusion or pneumothorax is seen.  The cardiomediastinal silhouette is mildly enlarged.  No acute osseous abnormalities are seen.  IMPRESSION: Increased interstitial markings, with mild bibasilar opacities, vascular congestion and mild cardiomegaly; these findings appear stable from multiple prior studies, raising question for chronic interstitial changes versus recurrent pulmonary edema.  Original Report Authenticated By: Tonia Ghent, M.D.    Lab  Results: Results for orders placed during the hospital encounter of 10/31/10 (from the past 48 hour(s))  BASIC METABOLIC PANEL     Status: Abnormal   Collection Time   11/02/10  5:52 AM      Component Value Range Comment   Sodium 138  135 - 145 (mEq/L)    Potassium 3.4 (*) 3.5 - 5.1 (mEq/L) DELTA CHECK NOTED   Chloride 99  96 - 112 (mEq/L)    CO2 28  19 - 32 (mEq/L)    Glucose, Bld 94  70 - 99  (mg/dL)    BUN 17  6 - 23 (mg/dL)    Creatinine, Ser 1.61  0.50 - 1.10 (mg/dL)    Calcium 8.9  8.4 - 10.5 (mg/dL)    GFR calc non Af Amer >60  >60 (mL/min)    GFR calc Af Amer >60  >60 (mL/min)   BASIC METABOLIC PANEL     Status: Normal   Collection Time   11/03/10  5:54 AM      Component Value Range Comment   Sodium 139  135 - 145 (mEq/L)    Potassium 3.5  3.5 - 5.1 (mEq/L)    Chloride 98  96 - 112 (mEq/L)    CO2 32  19 - 32 (mEq/L)    Glucose, Bld 95  70 - 99 (mg/dL)    BUN 12  6 - 23 (mg/dL)    Creatinine, Ser 0.96  0.50 - 1.10 (mg/dL)    Calcium 9.4  8.4 - 10.5 (mg/dL)    GFR calc non Af Amer >60  >60 (mL/min)    GFR calc Af Amer >60  >60 (mL/min)   CBC     Status: Abnormal   Collection Time   11/03/10  5:54 AM      Component Value Range Comment   WBC 6.2  4.0 - 10.5 (K/uL)    RBC 4.58  3.87 - 5.11 (MIL/uL)    Hemoglobin 11.9 (*) 12.0 - 15.0 (g/dL)    HCT 04.5  40.9 - 81.1 (%)    MCV 83.6  78.0 - 100.0 (fL)    MCH 26.0  26.0 - 34.0 (pg)    MCHC 31.1  30.0 - 36.0 (g/dL)    RDW 91.4 (*) 78.2 - 15.5 (%)    Platelets 233  150 - 400 (K/uL)    Recent Results (from the past 240 hour(s))  MRSA PCR SCREENING     Status: Normal   Collection Time   10/31/10  1:47 PM      Component Value Range Status Comment   MRSA by PCR NEGATIVE  NEGATIVE  Final      Hospital Course: This 50 year old lady was admitted with a history of dyspnea and leg swelling. Her admission weight was 125 kg. The main reason that she had the symptoms was that she ran out of all her medications. She does not have any health insurance and did not have the financial means to obtain further medications. Therefore she was admitted to the hospital. This lady has a history of cardiomyopathy with ejection fraction of 10-15% only. Dr. Dietrich Pates is her cardiologist. She was admitted and started on intravenous diuretics. Her TSH was checked and it was extremely elevated at 104.1. She had not been taking her thyroid medications  for the last 2 months or so. Since she has been hospitalized, her dyspnea has improved and her leg swelling also has improved. However, she has not lost any significant weight. Cardiology has  seen her while she has been an inpatient and wish to begin her also on spironolactone. Cardiology will follow her up in the outpatient setting.  Discharge Exam: Blood pressure 101/63, pulse 88, temperature 97.3 F (36.3 C), temperature source Oral, resp. rate 19, height 5\' 7"  (1.702 m), weight 125.1 kg (275 lb 12.7 oz), SpO2 97.00%. On physical examination today she looks systemically well. She does not have increased work of breathing. Heart sounds are present without gallop rhythm. Lung fields are clinically clear. Examination of her legs show swelling but there is no significant pitting edema, enough to explain severe congestive heart failure. I suspect that significant edema is also present because of her hypothyroidism. She is alert and orientated without any focal neurological signs.  Disposition: Home. We will try to obtain for her medications from the hospital free of charge so that she has enough until she sees the health department.  Discharge Orders    Future Appointments: Provider: Department: Dept Phone: Center:   11/11/2010 1:00 PM Joni Reining, NP [facility] [phone] [id number]   12/03/2010 2:45 PM Gerrit Friends. Dietrich Pates, MD [facility] [phone] [id number]     Future Orders Please Complete By Expires   Diet - low sodium heart healthy      Increase activity slowly         Follow-up Information    Follow up with Joni Reining, NP on 11/11/2010. (1 pm, [facility])    Contact information:   [address] [place] [address] [phone]          Signed: Wilson Singer 11/03/2010, 9:45 AM

## 2010-11-03 NOTE — Progress Notes (Signed)
PIV removed without complaint, patient hemodynamically stable upon discharge.Patient verbalizes understanding of discharge instructions, follow up appointments and prescription instructions. Patient escorted out by NA, transported by family.

## 2010-11-03 NOTE — Progress Notes (Signed)
Patient verbalizes understanding of [facility] information.

## 2010-11-03 NOTE — Progress Notes (Signed)
Agree, reviewed with Melanie Cordova. No major change in plan. Medical therapy has been reinitiated. Social services involved to hopefully assist with medications. Office followup arranged after discharge.

## 2010-11-03 NOTE — Progress Notes (Signed)
SUBJECTIVE: She is feeling better today, breathing status is improved. Frustrated with meds assistance as outpatient.   Filed Vitals:   11/02/10 1400 11/02/10 1543 11/02/10 2053 11/03/10 0651  BP: 83/55  100/71 101/63  Pulse: 82  99 88  Temp: 97.9 F (36.6 C)  98 F (36.7 C) 97.3 F (36.3 C)  TempSrc: Oral  Oral Oral  Resp: 18  18 19   Height:      Weight:    275 lb 12.7 oz (125.1 kg)  SpO2: 96% 95% 94% 97%    Intake/Output Summary (Last 24 hours) at 11/03/10 0921 Last data filed at 11/03/10 0000  Gross per 24 hour  Intake    960 ml  Output   1000 ml  Net    -40 ml    LABS: Basic Metabolic Panel:  Basename 11/03/10 0554 11/02/10 0552  NA 139 138  K 3.5 3.4*  CL 98 99  CO2 32 28  GLUCOSE 95 94  BUN 12 17  CREATININE 0.79 0.81  CALCIUM 9.4 8.9  MG -- --  PHOS -- --   Liver Function Tests: No results found for this basename: AST:2,ALT:2,ALKPHOS:2,BILITOT:2,PROT:2,ALBUMIN:2 in the last 72 hours No results found for this basename: LIPASE:2,AMYLASE:2 in the last 72 hours CBC:  Basename 11/03/10 0554 11/01/10 0457  WBC 6.2 9.2  NEUTROABS -- --  HGB 11.9* 12.4  HCT 38.3 39.8  MCV 83.6 83.8  PLT 233 247     Basename 11/01/10 0459  CHOL 193  HDL 38*  LDLCALC 117*  TRIG 192*  CHOLHDL 5.1  LDLDIRECT --   Thyroid Function Tests: No results found for this basename: TSH,T4TOTAL,FREET3,T3FREE,THYROIDAB in the last 72 hours   RADIOLOGY: Dg Chest Portable 1 View  10/31/2010  *RADIOLOGY REPORT*    IMPRESSION: Increased interstitial markings, with mild bibasilar opacities, vascular congestion and mild cardiomegaly; these findings appear stable from multiple prior studies, raising question for chronic interstitial changes versus recurrent pulmonary edema.  Original Report Authenticated By: Tonia Ghent, M.D.    PHYSICAL EXAM General: Well developed, well nourished, in no acute distress Head: Eyes PERRLA, No xanthomas.   Normal cephalic and atramatic  Lungs:  Clear bilaterally to auscultation and percussion. Heart: HRRR S1 S2, Pulses are 2+ & equal.            No carotid bruit. No JVD.  No abdominal bruits. No femoral bruits. Abdomen: Bowel sounds are positive, abdomen soft and non-tender without masses or                  Hernia's noted. Msk:  Back normal, normal gait. Normal strength and tone for age. Extremities: No clubbing, cyanosis or edema.  DP +1 Neuro: Alert and oriented X 3. Psych:  Good affect, responds appropriately   ASSESSMENT AND PLAN:  Principal Problem:  *Systolic CHF, acute Active Problems:  Morbid obesity  Hypertension  Gastroesophageal reflux disease  Hypothyroidism  Hypotension   1. Acute on chronic systolic CHF: This is in part related to medical noncompliance in the setting of unable to obtain medications. She ran out 4 days prior to symptoms which required ER evaluation. She has diuresed 2600 cc initially on 80 mg of IV lasix. She is now on 20 mg IV BID. Wt on discharge 285 lbs., wt on admission 265 lbs. Now 275, believe this original wt in ER was invalid.  Will change to p.o. Lasix 60mg  daily.  Add spironolactone 25mg  daily and change metoprolol to coreg 3.125 BID with uptitration as  an OP, with close follow-up next week. Continue to monitor response. Would still consider catheterization for further evaluation of coronary anatomy.  She will have close follow-up in the office post discharge- November 11, 2010.  This is placed in discharge follow-up appointment schedule. 2. Medical noncompliance: She is being evaluated by social services. Possible program assistance. Recommend home health nurses, daily wts at home and any other assistance she qualifies for to continue optimal OP care to avoid readmission. 3. Hypothyroidism: TSH is very elevated. Inconsistent with stopping levothyroxine for only 4 days. PCP in hospital is repeating this. She may not have been taking it at all. Uncertain. This could have exacerbated this episode  of heart failure as well.  Bettey Mare. Lyman Bishop NP

## 2010-11-03 NOTE — Progress Notes (Signed)
Pt d/c home. [facility]ointment [facility] . Medication assistance given through [facility]. [facility] services arranged.

## 2010-11-03 NOTE — Progress Notes (Signed)
[facility] SURGICAL UNIT [address] [place] [address]  November 03, 2010  Patient: [name]  Date of Birth: 02/07/1961  Date of Visit: 10/31/2010    To Whom It May Concern:  [place] was seen and treated in our hospital on 10/31/2010. She can return to work on 11/04/2010.   If you have any questions or concerns, please don't hesitate to call.  Sincerely,

## 2010-11-03 NOTE — Progress Notes (Signed)
Encounter addended by: Karen Kays on: 11/03/2010  5:08 PM<BR>     Documentation filed: Flowsheet VN

## 2010-11-03 NOTE — Progress Notes (Signed)
Arrangements made for appointment with [facility]. Also gave number of [facility] to help her with transportation to  md appointments . Adv home health arranged for chf management.

## 2010-11-11 ENCOUNTER — Ambulatory Visit (INDEPENDENT_AMBULATORY_CARE_PROVIDER_SITE_OTHER): Payer: Self-pay | Admitting: Adult Health

## 2010-11-11 ENCOUNTER — Encounter: Payer: Self-pay | Admitting: Adult Health

## 2010-11-11 DIAGNOSIS — I509 Heart failure, unspecified: Secondary | ICD-10-CM

## 2010-11-11 DIAGNOSIS — I428 Other cardiomyopathies: Secondary | ICD-10-CM

## 2010-11-11 DIAGNOSIS — I5021 Acute systolic (congestive) heart failure: Secondary | ICD-10-CM

## 2010-11-11 MED ORDER — CARVEDILOL 25 MG PO TABS: 25.0000 mg | ORAL_TABLET | Freq: Two times a day (BID) | ORAL | Status: DC

## 2010-11-11 MED ORDER — CARVEDILOL 12.5 MG PO TABS: 12.5000 mg | ORAL_TABLET | Freq: Two times a day (BID) | ORAL | Status: DC

## 2010-11-11 NOTE — Patient Instructions (Signed)
Your physician has recommended you make the following change in your medication: increase Coreg to 25 mg twice daily  Your physician recommends that you schedule a follow-up appointment in: 2 weeks

## 2010-11-11 NOTE — Assessment & Plan Note (Signed)
Will increase Coreg to 25mg  BID to give maximum therapeutic benefit.  Keep BP between 95-110 systolic.  She is to continue other medications as directed.  She will need to have a follow-up ECHO in 3 months for re-evaluation of her LV fx.  She will see [place] every 2 weeks for a while to keep her on track.  She is advised of wt gain of > 2lbs in 24 hours, or 5 lbs in one week, to give [place] a call.

## 2010-11-11 NOTE — Progress Notes (Signed)
HPI:  Melanie Cordova is a pleasant 50 y/o F patient of Dr. Dietrich Pates we are seeing on follow-up after recent hospitalization for CHF exacerbation.  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  She had run out of medications secondary to financial issues. She was seen by social services in the hospital and assistance with medications was provided via vouchers and low cost medications through pharmacy.  She has done very well since discharge and has lost 2 lbs since discharge per our scale. She has home health nurses and is avoiding salt, weighing herself daily.  She is medically compliant.  Allergies  Allergen Reactions  . Penicillins Shortness Of Breath    Hair loss  . Iodinated Diagnostic Agents Hives and Other (See Comments)    Pulmonary problems; no frank respiratory arrest  . Sulfa Antibiotics Other (See Comments)    Pt can't remember reaction    Current Outpatient Prescriptions  Medication Sig Dispense Refill  . Albuterol Sulfate (VENTOLIN HFA IN) Inhale 2 puffs into the lungs every 4 (four) hours as needed. Shortness of breath       . Alpha-D-Galactosidase (BEANO PO) Take by mouth daily.        . Calcium Carbonate-Vitamin D (CALTRATE 600+D) 600-400 MG-UNIT per chew tablet Chew 1 tablet by mouth 2 (two) times daily with a meal.        . carvedilol (COREG) 12.5 MG tablet Take 1 tablet (12.5 mg total) by mouth 2 (two) times daily with a meal.  60 tablet  0  . ferrous gluconate (FERGON) 324 MG tablet Take 324 mg by mouth daily with breakfast.        . furosemide (LASIX) 80 MG tablet Take 1 tablet (80 mg total) by mouth 2 (two) times daily. Call for increased weight  60 tablet  0  . levothyroxine (SYNTHROID, LEVOTHROID) 112 MCG tablet Take 3 tablets (336 mcg total) by mouth daily.  90 tablet  0  . lisinopril (PRINIVIL,ZESTRIL) 2.5 MG tablet Take 1 tablet (2.5 mg total) by mouth daily.  30 tablet  0  . loratadine (CLARITIN REDITABS) 10 MG dissolvable tablet  Take 10 mg by mouth daily.        . Multiple Vitamins-Minerals (CENTRUM SILVER ULTRA WOMENS PO) Take 1 tablet by mouth daily.        . potassium chloride SA (K-DUR,KLOR-CON) 20 MEQ tablet Take 2 tablets (40 mEq total) by mouth 2 (two) times daily.  120 tablet  0  . ranitidine (ZANTAC) 150 MG tablet Take 150 mg by mouth 2 (two) times daily as needed. For gastric reflux       . rosuvastatin (CRESTOR) 20 MG tablet Take 20 mg by mouth daily.        Marland Kitchen spironolactone (ALDACTONE) 25 MG tablet Take 1 tablet (25 mg total) by mouth daily.  30 tablet  0    Past Medical History  Diagnosis Date  . Hypertension     09/2010-normal CMet and CBC; Lipid profile-116, 88, 25, 73  . Cardiomyopathy 08/2010    Presented with congestive heart failure  . Gastroesophageal reflux disease   . Hypothyroidism     Recent TSH was normal.  . Pneumonia   . Obesity   . CHF (congestive heart failure)     No past surgical history on file.  RUE:AVWUJW of systems complete and found to be negative unless listed above PHYSICAL EXAM BP 118/88  Pulse 92  Resp 18  Ht 5\' 7"  (1.702  m)  Wt 270 lb (122.471 kg)  BMI 42.29 kg/m2  SpO2 99%  General: Well developed, well nourished, in no acute distress Head: Eyes PERRLA, No xanthomas.   Normal cephalic and atramatic  Lungs: Clear bilaterally to auscultation and percussion. Heart: HRRR S1 S2,r.  Pulses are 2+ & equal.            No carotid bruit. No JVD.  No abdominal bruits. No femoral bruits. Abdomen: Bowel sounds are positive, abdomen soft and non-tender without masses or                  Hernia's noted. Msk:  Back normal, normal gait. Normal strength and tone for age. Extremities: No clubbing, cyanosis, mild nonpitting edema. DP +1. Neuro: Alert and oriented X 3. Psych:  Good affect, responds appropriately   ASSESSMENT AND PLAN

## 2010-11-11 NOTE — Assessment & Plan Note (Signed)
She is adhering to low salt. Low fat diet. 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

## 2010-11-16 ENCOUNTER — Encounter (HOSPITAL_COMMUNITY): Payer: Self-pay

## 2010-11-16 ENCOUNTER — Other Ambulatory Visit: Payer: Self-pay

## 2010-11-16 ENCOUNTER — Telehealth: Payer: Self-pay | Admitting: Cardiology

## 2010-11-16 ENCOUNTER — Inpatient Hospital Stay (HOSPITAL_COMMUNITY)
Admission: EM | Admit: 2010-11-16 | Discharge: 2010-11-18 | DRG: 312 | Disposition: A | Discharge status: Home Health | Payer: Self-pay | Attending: Internal Medicine | Admitting: Internal Medicine

## 2010-11-16 DIAGNOSIS — I10 Essential (primary) hypertension: Secondary | ICD-10-CM

## 2010-11-16 DIAGNOSIS — R202 Paresthesia of skin: Secondary | ICD-10-CM | POA: Diagnosis present

## 2010-11-16 DIAGNOSIS — R200 Anesthesia of skin: Secondary | ICD-10-CM

## 2010-11-16 DIAGNOSIS — I509 Heart failure, unspecified: Secondary | ICD-10-CM | POA: Diagnosis present

## 2010-11-16 DIAGNOSIS — G479 Sleep disorder, unspecified: Secondary | ICD-10-CM

## 2010-11-16 DIAGNOSIS — I5021 Acute systolic (congestive) heart failure: Secondary | ICD-10-CM

## 2010-11-16 DIAGNOSIS — E039 Hypothyroidism, unspecified: Secondary | ICD-10-CM

## 2010-11-16 DIAGNOSIS — T448X5A Adverse effect of centrally-acting and adrenergic-neuron-blocking agents, initial encounter: Secondary | ICD-10-CM | POA: Diagnosis present

## 2010-11-16 DIAGNOSIS — I959 Hypotension, unspecified: Secondary | ICD-10-CM | POA: Diagnosis present

## 2010-11-16 DIAGNOSIS — G459 Transient cerebral ischemic attack, unspecified: Secondary | ICD-10-CM | POA: Diagnosis present

## 2010-11-16 DIAGNOSIS — K219 Gastro-esophageal reflux disease without esophagitis: Secondary | ICD-10-CM

## 2010-11-16 DIAGNOSIS — I5022 Chronic systolic (congestive) heart failure: Secondary | ICD-10-CM

## 2010-11-16 DIAGNOSIS — I9589 Other hypotension: Principal | ICD-10-CM | POA: Diagnosis present

## 2010-11-16 MED ORDER — POTASSIUM CHLORIDE CRYS ER 20 MEQ PO TBCR: 40.0000 meq | EXTENDED_RELEASE_TABLET | Freq: Two times a day (BID) | ORAL | Status: DC

## 2010-11-16 MED ORDER — CARVEDILOL 25 MG PO TABS: 25.0000 mg | ORAL_TABLET | Freq: Two times a day (BID) | ORAL | Status: DC

## 2010-11-16 MED ORDER — FUROSEMIDE 80 MG PO TABS: 80.0000 mg | ORAL_TABLET | Freq: Two times a day (BID) | ORAL | Status: DC

## 2010-11-16 NOTE — Telephone Encounter (Signed)
Patient needs handwritten RX's for Potassium, Carvedilol and Furosemide so she can send to [facility] / tg

## 2010-11-17 ENCOUNTER — Inpatient Hospital Stay (HOSPITAL_COMMUNITY): Payer: Self-pay

## 2010-11-17 ENCOUNTER — Emergency Department (HOSPITAL_COMMUNITY): Payer: Self-pay

## 2010-11-17 ENCOUNTER — Encounter (HOSPITAL_COMMUNITY): Payer: Self-pay | Admitting: Internal Medicine

## 2010-11-17 DIAGNOSIS — I5022 Chronic systolic (congestive) heart failure: Secondary | ICD-10-CM | POA: Diagnosis present

## 2010-11-17 DIAGNOSIS — R200 Anesthesia of skin: Secondary | ICD-10-CM | POA: Diagnosis present

## 2010-11-17 DIAGNOSIS — I959 Hypotension, unspecified: Secondary | ICD-10-CM

## 2010-11-17 LAB — CARDIAC PANEL(CRET KIN+CKTOT+MB+TROPI)
CK, MB: 1.7 ng/mL (ref 0.3–4.0)
CK, MB: 1.9 ng/mL (ref 0.3–4.0)
Relative Index: INVALID (ref 0.0–2.5)
Relative Index: INVALID (ref 0.0–2.5)
Total CK: 28 U/L (ref 7–177)
Total CK: 24 U/L (ref 7–177)
Troponin I: <0.3 ng/mL (ref <0.30)

## 2010-11-17 LAB — APTT: aPTT: 24 seconds (ref 24–37)

## 2010-11-17 LAB — COMPREHENSIVE METABOLIC PANEL
AST: 21 U/L (ref 0–37)
BUN: 16 mg/dL (ref 6–23)
CO2: 30 mEq/L (ref 19–32)
Calcium: 8.8 mg/dL (ref 8.4–10.5)
Chloride: 101 mEq/L (ref 96–112)
Creatinine, Ser: 0.83 mg/dL (ref 0.50–1.10)
GFR calc Af Amer: >60 mL/min (ref >60)
GFR calc non Af Amer: >60 mL/min (ref >60)
Glucose, Bld: 109 mg/dL — ABNORMAL HIGH (ref 70–99)
Total Bilirubin: 0.3 mg/dL (ref 0.3–1.2)

## 2010-11-17 LAB — DIFFERENTIAL
Basophils Absolute: 0.1 10*3/uL (ref 0.0–0.1)
Basophils Relative: 1 % (ref 0–1)
Eosinophils Absolute: 0.3 10*3/uL (ref 0.0–0.7)
Eosinophils Relative: 3 % (ref 0–5)
Lymphs Abs: 2.4 10*3/uL (ref 0.7–4.0)

## 2010-11-17 LAB — CBC
HCT: 37.2 % (ref 36.0–46.0)
Hemoglobin: 12 g/dL (ref 12.0–15.0)
Hemoglobin: 12 g/dL (ref 12.0–15.0)
MCH: 26.4 pg (ref 26.0–34.0)
MCH: 27.1 pg (ref 26.0–34.0)
MCHC: 32.3 g/dL (ref 30.0–36.0)
MCV: 84 fL (ref 78.0–100.0)
Platelets: 222 10*3/uL (ref 150–400)
Platelets: 238 10*3/uL (ref 150–400)
RBC: 4.43 MIL/uL (ref 3.87–5.11)
RBC: 4.54 MIL/uL (ref 3.87–5.11)
RDW: 17.4 % — ABNORMAL HIGH (ref 11.5–15.5)
WBC: 8.5 10*3/uL (ref 4.0–10.5)

## 2010-11-17 LAB — POCT I-STAT, CHEM 8
BUN: 24 mg/dL — ABNORMAL HIGH (ref 6–23)
Calcium, Ion: 1.1 mmol/L — ABNORMAL LOW (ref 1.12–1.32)
Chloride: 103 mEq/L (ref 96–112)
Creatinine, Ser: 0.9 mg/dL (ref 0.50–1.10)
Glucose, Bld: 112 mg/dL — ABNORMAL HIGH (ref 70–99)
HCT: 39 % (ref 36.0–46.0)
Hemoglobin: 13.3 g/dL (ref 12.0–15.0)
Potassium: 5 mEq/L (ref 3.5–5.1)
Sodium: 139 mEq/L (ref 135–145)
TCO2: 27 mmol/L (ref 0–100)

## 2010-11-17 LAB — POCT I-STAT TROPONIN I: Troponin i, poc: 0 ng/mL (ref 0.00–0.08)

## 2010-11-17 LAB — TSH: TSH: 0.443 u[IU]/mL (ref 0.350–4.500)

## 2010-11-17 MED ORDER — SODIUM CHLORIDE 0.9 % IV SOLN
INTRAVENOUS | Status: DC
  Administered 2010-11-17: 1000 mL via INTRAVENOUS

## 2010-11-17 MED ORDER — ACETAMINOPHEN 325 MG PO TABS: 650.0000 mg | ORAL_TABLET | Freq: Four times a day (QID) | ORAL | Status: DC | PRN

## 2010-11-17 MED ORDER — LISINOPRIL 2.5 MG PO TABS
2.5000 mg | ORAL_TABLET | Freq: Every day | ORAL | Status: DC
  Filled 2010-11-17 (×3): qty 1

## 2010-11-17 MED ORDER — NITROGLYCERIN 0.4 MG SL SUBL
SUBLINGUAL_TABLET | SUBLINGUAL | Status: AC
  Administered 2010-11-17: 0.4 mg via SUBLINGUAL
  Filled 2010-11-17: qty 25

## 2010-11-17 MED ORDER — NITROGLYCERIN 0.4 MG SL SUBL
0.4000 mg | SUBLINGUAL_TABLET | SUBLINGUAL | Status: DC | PRN
  Administered 2010-11-17: 0.4 mg via SUBLINGUAL

## 2010-11-17 MED ORDER — SPIRONOLACTONE 25 MG PO TABS
25.0000 mg | ORAL_TABLET | Freq: Every day | ORAL | Status: DC
  Administered 2010-11-17 – 2010-11-18 (×2): 25 mg via ORAL
  Filled 2010-11-17 (×2): qty 1

## 2010-11-17 MED ORDER — POTASSIUM CHLORIDE CRYS ER 20 MEQ PO TBCR: 40.0000 meq | EXTENDED_RELEASE_TABLET | ORAL | Status: DC

## 2010-11-17 MED ORDER — ASPIRIN EC 325 MG PO TBEC
325.0000 mg | DELAYED_RELEASE_TABLET | Freq: Every day | ORAL | Status: DC
  Administered 2010-11-17 – 2010-11-18 (×2): 325 mg via ORAL
  Filled 2010-11-17 (×2): qty 1

## 2010-11-17 MED ORDER — SODIUM CHLORIDE 0.9 % IJ SOLN
3.0000 mL | Freq: Two times a day (BID) | INTRAMUSCULAR | Status: DC
  Administered 2010-11-17 (×2): 3 mL via INTRAVENOUS
  Filled 2010-11-17 (×2): qty 3

## 2010-11-17 MED ORDER — OXYCODONE HCL 5 MG PO TABS: 5.0000 mg | ORAL_TABLET | ORAL | Status: DC | PRN

## 2010-11-17 MED ORDER — SENNA 8.6 MG PO TABS: 2.0000 | ORAL_TABLET | Freq: Every day | ORAL | Status: DC | PRN

## 2010-11-17 MED ORDER — ACETAMINOPHEN 650 MG RE SUPP: 650.0000 mg | Freq: Four times a day (QID) | RECTAL | Status: DC | PRN

## 2010-11-17 MED ORDER — MORPHINE SULFATE 2 MG/ML IJ SOLN
2.0000 mg | INTRAMUSCULAR | Status: DC | PRN
  Administered 2010-11-17 – 2010-11-18 (×4): 2 mg via INTRAVENOUS
  Filled 2010-11-17 (×4): qty 1

## 2010-11-17 MED ORDER — FLEET ENEMA 7-19 GM/118ML RE ENEM: 1.0000 | ENEMA | Freq: Every day | RECTAL | Status: DC | PRN

## 2010-11-17 MED ORDER — FUROSEMIDE 40 MG PO TABS
40.0000 mg | ORAL_TABLET | Freq: Two times a day (BID) | ORAL | Status: DC
  Administered 2010-11-17 – 2010-11-18 (×3): 40 mg via ORAL
  Filled 2010-11-17 (×3): qty 1

## 2010-11-17 MED ORDER — PNEUMOCOCCAL VAC POLYVALENT 25 MCG/0.5ML IJ INJ
0.5000 mL | INJECTION | INTRAMUSCULAR | Status: AC
  Administered 2010-11-18: 0.5 mL via INTRAMUSCULAR
  Filled 2010-11-17: qty 0.5

## 2010-11-17 MED ORDER — CARVEDILOL 12.5 MG PO TABS
12.5000 mg | ORAL_TABLET | Freq: Two times a day (BID) | ORAL | Status: DC
  Administered 2010-11-17 – 2010-11-18 (×2): 12.5 mg via ORAL
  Filled 2010-11-17 (×2): qty 1

## 2010-11-17 MED ORDER — ENOXAPARIN SODIUM 40 MG/0.4ML ~~LOC~~ SOLN
40.0000 mg | Freq: Every day | SUBCUTANEOUS | Status: DC
  Administered 2010-11-17 – 2010-11-18 (×2): 40 mg via SUBCUTANEOUS
  Filled 2010-11-17 (×2): qty 0.4

## 2010-11-17 MED ORDER — LEVOTHYROXINE SODIUM 100 MCG PO TABS
300.0000 ug | ORAL_TABLET | Freq: Every day | ORAL | Status: DC
  Administered 2010-11-17 – 2010-11-18 (×2): 300 ug via ORAL
  Filled 2010-11-17: qty 1
  Filled 2010-11-17: qty 3

## 2010-11-17 MED ORDER — SODIUM CHLORIDE 0.9 % IV SOLN: Freq: Once | INTRAVENOUS | Status: DC

## 2010-11-17 MED ORDER — SODIUM CHLORIDE 0.9 % IV BOLUS (SEPSIS): 500.0000 mL | Freq: Once | INTRAVENOUS | Status: DC

## 2010-11-17 MED ORDER — DOCUSATE SODIUM 100 MG PO CAPS
100.0000 mg | ORAL_CAPSULE | Freq: Two times a day (BID) | ORAL | Status: DC
  Administered 2010-11-17 – 2010-11-18 (×3): 100 mg via ORAL
  Filled 2010-11-17 (×3): qty 1

## 2010-11-17 MED ORDER — ALBUTEROL SULFATE (5 MG/ML) 0.5% IN NEBU: 2.5000 mg | INHALATION_SOLUTION | RESPIRATORY_TRACT | Status: DC | PRN

## 2010-11-17 MED ORDER — ROSUVASTATIN CALCIUM 20 MG PO TABS
20.0000 mg | ORAL_TABLET | Freq: Every day | ORAL | Status: DC
  Administered 2010-11-17: 20 mg via ORAL
  Filled 2010-11-17: qty 1

## 2010-11-17 MED ORDER — ONDANSETRON HCL 4 MG/2ML IJ SOLN
4.0000 mg | Freq: Four times a day (QID) | INTRAMUSCULAR | Status: DC | PRN
  Administered 2010-11-18: 4 mg via INTRAVENOUS
  Filled 2010-11-17: qty 2

## 2010-11-17 MED ORDER — FAMOTIDINE 20 MG PO TABS
10.0000 mg | ORAL_TABLET | Freq: Every day | ORAL | Status: DC
  Administered 2010-11-17 – 2010-11-18 (×2): 10 mg via ORAL
  Filled 2010-11-17 (×2): qty 1

## 2010-11-17 MED ORDER — ONDANSETRON HCL 4 MG PO TABS: 4.0000 mg | ORAL_TABLET | Freq: Four times a day (QID) | ORAL | Status: DC | PRN

## 2010-11-17 NOTE — Progress Notes (Signed)
Subjective: Melanie Cordova is well-known to me is the result of recent intensive outpatient care for a cardiomyopathy of uncertain etiology. This nice woman has done well with initial medical therapy, but is now admitted after experiencing increasing fatigue, weakness and then focal neurologic symptoms with right-sided sensory findings, right-sided weakness and slurred speech. Patient is left-handed.  Neurologic symptoms have cleared, and she is essentially back to baseline. Blood pressure has been intermittently in the low 90s. Control of congestive heart failure appears to be fairly good with a negative chest x-ray, benign exam and no orthopnea nor PND.  Objective: Vital signs in last 24 hours: Temp:  [97.7 F (36.5 C)-98.4 F (36.9 C)] 98.3 F (36.8 C) (09/05 1600) Pulse Rate:  [42-101] 92  (09/05 1600) Resp:  [17-28] 25  (09/05 1700) BP: (86-112)/(46-76) 96/63 mmHg (09/05 1700) SpO2:  [95 %-100 %] 100 % (09/05 1600) Weight:  [124.4 kg (274 lb 4 oz)] 274 lb 4 oz (124.4 kg) (09/05 0359) Weight change:  Last BM Date: 11/16/10  General-Well developed; no acute distress Body habitus-obese Neck-No JVD, no carotid bruits Lungs: Few basilar rales; normal I:E ratio Cardiovascular-normal PMI; normal S1 and S2; modest systolic murmur Abdomen-normal bowel sounds; soft and non-tender without masses or organomegaly Skin-Warm, no significant lesions Extremities-Nl distal pulses; 1/2+ edema   Lab Results:  Basename 11/17/10 0432 11/17/10 0032  WBC 8.6 8.5  HGB 12.0 12.0  HCT 38.3 37.2  PLT 238 222   BMET  Basename 11/17/10 0432 11/17/10 0026  NA 137 139  K 3.6 5.0  CL 101 103  CO2 30 --  GLUCOSE 109* 112*  BUN 16 24*  CREATININE 0.83 0.90  CALCIUM 8.8 --    Studies/Results: Dg Chest 1 View  11/17/2010  *RADIOLOGY REPORT*  Clinical Data: Chest pain and shortness of breath.  History of cardiomyopathy and CHF.  CHEST - 1 VIEW  Comparison: 11/03/2010  Findings: Cardiac enlargement with  normal pulmonary vascularity. Probable emphysematous changes in the upper lungs.  No focal airspace consolidation.  No blunting of costophrenic angles.  No pneumothorax.  IMPRESSION: Cardiac enlargement.  No evidence of active pulmonary disease.  Original Report Authenticated By: Marlon Pel, M.D.   Ct Head Wo Contrast  11/17/2010  *RADIOLOGY REPORT*  Clinical Data: Headache, right-sided numbness, hypertension  CT HEAD WITHOUT CONTRAST  Technique:  Contiguous axial images were obtained from the base of the skull through the vertex without contrast.  Comparison: MRI brain 09/05/2006  Findings: Ventricles and sulci appear symmetrical without mass effect or midline shift.  No abnormal extra-axial fluid collections.  Gray-white matter junctions are distinct.  Basal cisterns are not effaced.  No evidence of acute intracranial hemorrhage.  No depressed skull fractures.  Visualized paranasal sinuses and mastoid air cells are not opacified.  Old nasal fractures.  IMPRESSION: No evidence of acute intracranial hemorrhage, mass lesion, or acute infarct.  Original Report Authenticated By: Marlon Pel, M.D.   Mr Angiogram Head Wo Contrast  11/17/2010  *RADIOLOGY REPORT*  Clinical Data: Weakness.  Right-sided numbness.  MRA HEAD WITHOUT CONTRAST  Technique: Angiographic images of the Circle of Willis were obtained using MRA technique without intravenous contrast.  Comparison: None.  Findings: The patient is claustrophobic and had difficulty remaining motionless for the exam.  Shortened scan times were utilized.  Widely patent internal carotid arteries.  Basilar artery patent with vertebrals codominant.  No intracranial proximal stenosis or aneurysm.  Poor flow related enhancement in the distal MCA and PCA vessels  could represent intracranial atherosclerotic change.  IMPRESSION: No proximal flow reducing intracranial atherosclerotic change is observed.  There is no visible intracranial aneurysm.  Original Report  Authenticated By: Elsie Stain, M.D.   Mr Brain Wo Contrast  11/17/2010  *RADIOLOGY REPORT*  Clinical Data: Right-sided numbness and weakness.  MRI HEAD WITHOUT CONTRAST  Technique:  Multiplanar, multiecho pulse sequences of the brain and surrounding structures were obtained according to standard protocol without intravenous contrast.  Comparison: None.  Findings: The patient was moderately uncooperative and could not remain motionless for the study.  Images are suboptimal.  Small or subtle lesions could be overlooked.  There is no evidence for acute infarction, intracranial hemorrhage, mass lesion, hydrocephalus, or extra-axial fluid.  There is mild atrophy and minimal chronic microvascular ischemic change.  No foci of chronic hemorrhage.  Pituitary and cerebellar tonsils unremarkable.  No midline shift.  Grossly patent carotid and basilar arteries.  There is no acute orbital, mastoid, or sinus abnormality.  IMPRESSION: Mild atrophy with minimal small vessel disease.  No acute intracranial findings.  Original Report Authenticated By: Elsie Stain, M.D.   US Carotid Duplex Bilateral  11/17/2010  *RADIOLOGY REPORT*  Clinical Data: Stroke, syncope, hypertension and tobacco use.  BILATERAL CAROTID DUPLEX ULTRASOUND  Technique: Wallace Cullens scale imaging, color Doppler and duplex ultrasound was performed of bilateral carotid and vertebral arteries in the neck.  Comparison:  None.  Criteria:  Quantification of carotid stenosis is based on velocity parameters that correlate the residual internal carotid diameter with NASCET-based stenosis levels, using the diameter of the distal internal carotid lumen as the denominator for stenosis measurement.  The following velocity measurements were obtained:                   PEAK SYSTOLIC/END DIASTOLIC RIGHT ICA:                        95/25cm/sec CCA:                        106/33cm/sec SYSTOLIC ICA/CCA RATIO:     0.9 DIASTOLIC ICA/CCA RATIO:    0.8 ECA:                         96cm/sec  LEFT ICA:                        78/26cm/sec CCA:                        18/27cm/sec SYSTOLIC ICA/CCA RATIO:     0.9 DIASTOLIC ICA/CCA RATIO:    0.9 ECA:                        89cm/sec  Findings:  RIGHT CAROTID ARTERY: Mild intimal thickening present in the common carotid artery.  There may be a hint of minimal plaque in the carotid bulb.  Otherwise no significant focal plaque identified. There is no evidence of carotid stenosis.  RIGHT VERTEBRAL ARTERY:  Antegrade flow with normal thickening.  LEFT CAROTID ARTERY: Mild amount of predominately noncalcified plaque present at the level of the carotid bulb, proximal ICA and external carotid artery.  Estimated left ICA stenosis is less than 50%.  LEFT VERTEBRAL ARTERY:  Antegrade flow with normal wave form.  IMPRESSION: Less than 50% estimated left ICA stenosis with mild amount of  plaque present.  No significant plaque at the right carotid bifurcation.  Original Report Authenticated By: Reola Calkins, M.D.    Current Facility-Administered Medications  Medication Dose Route Frequency Provider Last Rate Last Dose  . acetaminophen (TYLENOL) tablet 650 mg  650 mg Oral Q6H PRN Osvaldo Shipper      . albuterol (PROVENTIL) (5 MG/ML) 0.5% nebulizer solution 2.5 mg  2.5 mg Nebulization Q2H PRN Osvaldo Shipper      . aspirin EC tablet 325 mg  325 mg Oral Daily Gokul Krishnan   325 mg at 11/17/10 1025  . docusate sodium (COLACE) capsule 100 mg  100 mg Oral BID Gokul Krishnan   100 mg at 11/17/10 1024  . enoxaparin (LOVENOX) injection 40 mg  40 mg Subcutaneous Daily Corinna L Sullivan   40 mg at 11/17/10 1025  . famotidine (PEPCID) tablet 10 mg  10 mg Oral Daily Gokul Krishnan   10 mg at 11/17/10 1025  . furosemide (LASIX) tablet 40 mg  40 mg Oral BID Gokul Krishnan   40 mg at 11/17/10 1714  . levothyroxine (SYNTHROID, LEVOTHROID) tablet 300 mcg  300 mcg Oral Daily Gokul Krishnan   300 mcg at 11/17/10 0831  . morphine injection 2 mg  2 mg Intravenous Q4H  PRN Gokul Krishnan   2 mg at 11/17/10 1330  . nitroGLYCERIN (NITROSTAT) SL tablet 0.4 mg  0.4 mg Sublingual Q5 min PRN Gokul Krishnan   0.4 mg at 11/17/10 0555  . ondansetron (ZOFRAN) tablet 4 mg  4 mg Oral Q6H PRN Gokul Krishnan       Or  . ondansetron (ZOFRAN) injection 4 mg  4 mg Intravenous Q6H PRN Gokul Krishnan      . oxyCODONE (Oxy IR/ROXICODONE) immediate release tablet 5 mg  5 mg Oral Q4H PRN Osvaldo Shipper      . pneumococcal 23 valent vaccine (PNU-IMMUNE) injection 0.5 mL  0.5 mL Intramuscular Tomorrow-1000 Gokul Krishnan      . rosuvastatin (CRESTOR) tablet 20 mg  20 mg Oral Daily Gokul Krishnan   20 mg at 11/17/10 1025  . senna (SENOKOT) tablet 17.2 mg  2 tablet Oral Daily PRN Osvaldo Shipper      . sodium chloride 0.9 % injection 3 mL  3 mL Intravenous Q12H Gokul Krishnan   3 mL at 11/17/10 1026  . sodium phosphate (FLEET) 7-19 GM/118ML enema 1 enema  1 enema Rectal Daily PRN Gokul Krishnan      . spironolactone (ALDACTONE) tablet 25 mg  25 mg Oral Daily Gokul Krishnan   25 mg at 11/17/10 1025  . DISCONTD: 0.9 %  sodium chloride infusion   Intravenous Once Vida Roller, MD      . DISCONTD: 0.9 %  sodium chloride infusion   Intravenous Continuous Gokul Krishnan 10 mL/hr at 11/17/10 0554 1,000 mL at 11/17/10 0554  . DISCONTD: acetaminophen (TYLENOL) suppository 650 mg  650 mg Rectal Q6H PRN Osvaldo Shipper      . DISCONTD: nitroGLYCERIN (NITROSTAT) SL tablet 0.4 mg  0.4 mg Sublingual Q5 Min x 3 PRN Vida Roller, MD   0.4 mg at 11/17/10 0010  . DISCONTD: potassium chloride SA (K-DUR,KLOR-CON) CR tablet 40 mEq  40 mEq Oral Q4H Billy Fischer, MD      . DISCONTD: sodium chloride 0.9 % bolus 500 mL  500 mL Intravenous Once Vida Roller, MD         Assessment/Plan: Ms. Homer presents with weakness, fatigue, hypertension and neurologic symptoms. Imaging  studies show no significant cerebrovascular disease. She may have had dysarthria but dysphasia would not be expected to be  associated with right-sided symptoms in a left-handed patient if the etiology were a TIA or RIND.  Weight is up 2 kg compared with her recent office visit, which is probably insignificant. CHF appears compensated. She can resume the medication doses that she was receiving when she was last seen in the office and hopefully be discharged within the next day or 2.  LOS: 1 day   [place] 11/17/2010, 6:48 PM

## 2010-11-17 NOTE — Consult Note (Signed)
CSW received referral to help pt complete advanced directive.  CSW met with pt.  Copy of advanced directive already provided to pt but she reports she has been unable to look over it as she has felt too sick.  Pt will alert CSW when ready to complete.  Karn Cassis

## 2010-11-17 NOTE — Progress Notes (Signed)
Pt to xrat dept for ct

## 2010-11-17 NOTE — ED Provider Notes (Signed)
History     CSN: [id number] Arrival date & time: 11/16/2010 11:31 PM  Chief Complaint  Patient presents with  . Chest Pain    Tightness   HPI Comments: Patient is a pleasant 50 year old female with a history of congestive heart failure which she states was diagnosed in the last 1-2 years. She presents with chest pain which has been ongoing for the last 24 hours, described as a pressure and associated with some difficulty breathing, headache, right arm pain. She denies being evaluated for this recently and denies having history of cardiac catheterization or stress test. She does relate an ejection fraction of 15%. She is followed by the cardiologist Dr. Dietrich Pates and Dr. wall. Symptoms are constant in her chest, described as a pressure or heaviness or squeezing, nothing makes better or worse it has been waxing and waning in intensity throughout the day.  During an admission 2 weeks ago, she was found to have significant hypothyroidism and acute heart failure exacerbation secondary to medication noncompliance. She was started on some of the lactone in addition to her Lasix do to fluid accumulation.  Patient is a 50 y.o. female presenting with chest pain. The history is provided by the patient and medical records.  Chest Pain     Past Medical History  Diagnosis Date  . Hypertension     09/2010-normal CMet and CBC; Lipid profile-116, 88, 25, 73  . Cardiomyopathy 08/2010    Presented with congestive heart failure  . Gastroesophageal reflux disease   . Hypothyroidism     Recent TSH was normal.  . Pneumonia   . Obesity   . CHF (congestive heart failure)     History reviewed. No pertinent past surgical history.  Family History  Problem Relation Age of Onset  . Cardiomyopathy Mother     ICD pacemaker-ischmic CM  . Heart failure Mother   . Hypertension Mother   . Cardiomyopathy Father     Deceased  . Coronary artery disease Father   . Heart failure Father     History  Substance Use  Topics  . Smoking status: Current Everyday Smoker -- 0.5 packs/day for 20 years    Types: Cigarettes    Last Attempt to Quit: 09/03/2010  . Smokeless tobacco: Not on file  . Alcohol Use: No    OB History    Grav Para Term Preterm Abortions TAB SAB Ect Mult Living                  Review of Systems  Cardiovascular: Positive for chest pain.  All other systems reviewed and are negative.    Physical Exam  BP 94/67  Pulse 47  Temp(Src) 98.3 F (36.8 C) (Oral)  Resp 22  SpO2 98%  Physical Exam  Nursing note and vitals reviewed. Constitutional: She appears well-developed and well-nourished. No distress.  HENT:  Head: Normocephalic and atraumatic.  Mouth/Throat: Oropharynx is clear and moist. No oropharyngeal exudate.  Eyes: Conjunctivae and EOM are normal. Pupils are equal, round, and reactive to light. Right eye exhibits no discharge. Left eye exhibits no discharge. No scleral icterus.  Neck: Normal range of motion. Neck supple. No JVD present. No thyromegaly present.       No JVD  Cardiovascular: Normal rate, regular rhythm, normal heart sounds and intact distal pulses.  Exam reveals no gallop and no friction rub.   No murmur heard. Pulmonary/Chest: Effort normal and breath sounds normal. No respiratory distress. She has no wheezes. She has no rales.  Mild tachypnea  Abdominal: Soft. Bowel sounds are normal. She exhibits no distension and no mass. There is no tenderness.  Musculoskeletal: Normal range of motion. She exhibits no edema and no tenderness.  Lymphadenopathy:    She has no cervical adenopathy.  Neurological: She is alert. Coordination normal.  Skin: Skin is warm and dry. No rash noted. No erythema.  Psychiatric: She has a normal mood and affect. Her behavior is normal.    ED Course  Procedures  MDM No edema or pulmonary edema on my exam, however the patient does have some hypotension which is unaccounted for. According to her EKG there is no acute  ischemia and it is very similar to prior EKG from December. Will need to have chest x-ray, lab workup, IV fluids for hypotension.  During course of evaluation patient had a decrease in her blood pressure to 80 systolic but had no significant symptoms during this time.    ED ECG REPORT   Date: 11/16/10 11:45 PM  Rate: 93  Rhythm: normal sinus rhythm  QRS Axis: normal  Intervals: QRS prolonged  ST/T Wave abnormalities: nonspecific ST/T changes  Conduction Disutrbances:nonspecific intraventricular conduction delay  Narrative Interpretation:   Old EKG Reviewed: unchanged 10/31/10  Results for orders placed during the hospital encounter of 11/16/10  CBC      Component Value Range   WBC 8.5  4.0 - 10.5 (K/uL)   RBC 4.43  3.87 - 5.11 (MIL/uL)   Hemoglobin 12.0  12.0 - 15.0 (g/dL)   HCT 16.1  09.6 - 04.5 (%)   MCV 84.0  78.0 - 100.0 (fL)   MCH 27.1  26.0 - 34.0 (pg)   MCHC 32.3  30.0 - 36.0 (g/dL)   RDW 40.9 (*) 81.1 - 15.5 (%)   Platelets 222  150 - 400 (K/uL)  DIFFERENTIAL      Component Value Range   Neutrophils Relative 57  43 - 77 (%)   Neutro Abs 4.8  1.7 - 7.7 (K/uL)   Lymphocytes Relative 28  12 - 46 (%)   Lymphs Abs 2.4  0.7 - 4.0 (K/uL)   Monocytes Relative 11  3 - 12 (%)   Monocytes Absolute 0.9  0.1 - 1.0 (K/uL)   Eosinophils Relative 3  0 - 5 (%)   Eosinophils Absolute 0.3  0.0 - 0.7 (K/uL)   Basophils Relative 1  0 - 1 (%)   Basophils Absolute 0.1  0.0 - 0.1 (K/uL)  APTT      Component Value Range   aPTT 24  24 - 37 (seconds)  PROTIME-INR      Component Value Range   Prothrombin Time 14.3  11.6 - 15.2 (seconds)   INR 1.09  0.00 - 1.49   PRO B NATRIURETIC PEPTIDE      Component Value Range   BNP, POC 963.8 (*) 0 - 125 (pg/mL)  POCT I-STAT, CHEM 8      Component Value Range   Sodium 139  135 - 145 (mEq/L)   Potassium 5.0  3.5 - 5.1 (mEq/L)   Chloride 103  96 - 112 (mEq/L)   BUN 24 (*) 6 - 23 (mg/dL)   Creatinine, Ser 9.14  0.50 - 1.10 (mg/dL)   Glucose, Bld  782 (*) 70 - 99 (mg/dL)   Calcium, Ion 9.56 (*) 1.12 - 1.32 (mmol/L)   TCO2 27  0 - 100 (mmol/L)   Hemoglobin 13.3  12.0 - 15.0 (g/dL)   HCT 21.3  08.6 - 57.8 (%)  POCT I-STAT TROPONIN I      Component Value Range   Troponin i, poc 0.00  0.00 - 0.08 (ng/mL)   Comment 3            Dg Chest 1 View  11/17/2010  *RADIOLOGY REPORT*  Clinical Data: Chest pain and shortness of breath.  History of cardiomyopathy and CHF.  CHEST - 1 VIEW  Comparison: 11/03/2010  Findings: Cardiac enlargement with normal pulmonary vascularity. Probable emphysematous changes in the upper lungs.  No focal airspace consolidation.  No blunting of costophrenic angles.  No pneumothorax.  IMPRESSION: Cardiac enlargement.  No evidence of active pulmonary disease.  Original Report Authenticated By: Marlon Pel, M.D.   In the presence of severe cardiomyopathy, the hypotension is more significant. She has no ischemia and no pulmonary edema and her laboratory workup is overall unremarkable. Her BNP is lower than the prior value. I have discussed her care with the hospitalist regarding admission for observation monitoring of her blood pressure. I suspect this may be due to her medication changes as she was recently started on additional diuretics. He has agreed to see her in the emergency department for admission.  Vida Roller, MD 11/21/10 [phone]

## 2010-11-17 NOTE — Progress Notes (Signed)
Patient complained about aching to her right arm from shoulder to elbow rated an 8 and tearful, causing frequent pvc's bigeminal on the monitor. CP protocol begun due to low bp cautiously.  1 SL  Nitro given and monitored for relief.  Then 1mg  morphine iv given. BP continues to remain low however her MAP is in the 70's Notified MD Rito Ehrlich about event.

## 2010-11-17 NOTE — Progress Notes (Signed)
Physical Therapy Evaluation Patient Name: Melanie Cordova JYNWG'N Date: 11/17/2010 Problem List:  Patient Active Problem List  Diagnoses  . Systolic CHF, acute  . Morbid obesity  . Sleep disturbance, unspecified  . Hypertension  . Cardiomyopathy  . Gastroesophageal reflux disease  . Hypothyroidism  . Hypotension  . Numbness and tingling of right arm and leg  . Systolic CHF, chronic   Past Medical History:  Past Medical History  Diagnosis Date  . Hypertension     09/2010-normal CMet and CBC; Lipid profile-116, 88, 25, 73  . Cardiomyopathy 08/2010    Presented with congestive heart failure  . Gastroesophageal reflux disease   . Hypothyroidism     Recent TSH was normal.  . Pneumonia   . Obesity   . CHF (congestive heart failure)    Past Surgical History: History reviewed. No pertinent past surgical history.  Precautions/Restrictions  Precautions Required Braces or Orthoses: No Restrictions Weight Bearing Restrictions: No Prior Functioning  Home Living Type of Home: Apartment Lives With: Family Receives Help From: Family Home Layout: One level Home Access: Level entry Bathroom Shower/Tub: Engineer, manufacturing systems: Standard Prior Function Level of Independence: Independent with basic ADLs Cognition Cognition Arousal/Alertness: Awake/alert Overall Cognitive Status: Appears within functional limits for tasks assessed Sensation/Coordination Sensation Light Touch: Appears Intact Proprioception: Appears Intact Coordination Gross Motor Movements are Fluid and Coordinated: Yes Fine Motor Movements are Fluid and Coordinated: Not tested Extremity Assessment RUE Assessment RUE Assessment: Within Functional Limits LUE Assessment LUE Assessment: Within Functional Limits RLE Assessment RLE Assessment: Exceptions to [facility] RLE Strength Right Hip Flexion: 4/5 Right Hip Extension: 3+/5 Right Hip ABduction: 3/5 Right Hip ADduction: 3/5 Right Knee Flexion:  4/5 Right Knee Extension: 4/5 LLE Assessment LLE Assessment: Within Functional Limits Mobility (including Balance) Bed Mobility Bed Mobility: Yes Rolling Right: 6: Modified independent (Device/Increase time) Right Sidelying to Sit: 6: Modified independent (Device/Increase time) Supine to Sit: 6: Modified independent (Device/Increase time) Sit to Supine - Right: 6: Modified independent (Device/Increase time) Transfers Transfers: Yes Sit to Stand: 7: Independent Stand Pivot Transfers: 7: Independent Ambulation/Gait Ambulation/Gait: Yes Ambulation/Gait Assistance: 5: Supervision Ambulation/Gait Assistance Details (indicate cue type and reason): Pt states she feels dizzy states she has had no sleep requests stop and do more at a later date. Ambulation Distance (Feet): 5 Feet Assistive device: None Gait Pattern: Decreased step length - right;Decreased step length - left Gait velocity: slow due to lines Stairs: No Wheelchair Mobility Wheelchair Mobility: No  Balance Balance Assessed: No Exercise  General Exercises - Lower Extremity Ankle Circles/Pumps: AROM;Both;10 reps;Supine Long Arc Quad: AROM;Both;10 reps;Seated Hip ABduction/ADduction: AROM;Both;10 reps;Supine Straight Leg Raises: AROM;Both;5 reps;Supine End of Session PT - End of Session Equipment Utilized During Treatment: Gait belt Activity Tolerance: Patient tolerated treatment well Patient left: in bed Nurse Communication: Mobility status for transfers General Behavior During Session: [facility] for tasks performed Cognition: [facility] for tasks performed PT Assessment/Plan/Recommendation PT Assessment Clinical Impression Statement: Pt with decreased strength,(but functional), in R LE.  Pt will benefit from skilled physical therapy to maximize functional potential.   PT Recommendation/Assessment: Patient will need skilled PT in the acute care venue PT Problem List: Decreased strength;Decreased activity tolerance PT Therapy  Diagnosis :  (R LE weakness) PT Plan PT Frequency: Min 3X/week PT Treatment/Interventions: Gait training;Therapeutic exercise PT Recommendation Follow Up Recommendations: None Equipment Recommended: None recommended by PT PT Goals  Acute Rehab PT Goals PT Goal Formulation: With patient Time For Goal Achievement: 3 days Pt will Ambulate:  (Pt  to be I with HEP for R LE) RUSSELL,CINDY 11/17/2010, 9:43 AM

## 2010-11-17 NOTE — Progress Notes (Signed)
Chart reviewed. Pt examined. Still weak. No recurrence of speech problems numbness or weakness. Shortness of breath is better. Blood pressure borderline. See orders. Await further workup.

## 2010-11-17 NOTE — H&P (Signed)
Melanie Cordova is an 50 y.o. female.  Her primary care physician is in the health department. Her cardiologist is Dr. Dietrich Pates.  Chief Complaint: Weakness and transient right-sided numbness  HPI: This is a 50 year old, Caucasian female, who has a history of congestive heart failure with EF of about 15%. This was diagnosed in June of this year. Patient has been in and out of the hospitals multiple times. She was seen last by Dr. Dietrich Pates. Last Thursday, and the dose of her carvedilol was increased from 12.5 mg twice a day to 25 mg twice a day. Patient tells me however that for the last week or, so she's been feeling very tired. She mentioned this to Dr. Dietrich Pates, who told her to monitor her symptoms. In the last 2 days she's been feeling extremely tired. She had pain in her shoulders. She denied any chest pain, but has been having difficulty breathing. Has had a cough, which is been dry. She denies any nausea, vomiting, fever or chills. And, then earlier last night she had headache, which is now better. She has difficulty speaking, which is now better and then she had numbness in the right side of her body which lasted for about 2 hours and, now it's resolved. She denies any leg swelling. She's never had these symptoms in the past.  She has the prescription bottle with Toprol-XL. She tells me that when she runs out of her carvedilol she is supposed to start taking the Toprol. She's not on any antiplatelet agent at this time.  Prior to Admission medications   Medication Sig Start Date End Date Taking? Authorizing Provider  Albuterol Sulfate (VENTOLIN HFA IN) Inhale 2 puffs into the lungs every 4 (four) hours as needed. Shortness of breath    Yes Historical Provider, MD  Alpha-D-Galactosidase (BEANO PO) Take by mouth daily.     Yes Historical Provider, MD  Calcium Carbonate-Vitamin D (CALTRATE 600+D) 600-400 MG-UNIT per chew tablet Chew 1 tablet by mouth 2 (two) times daily with a meal.     Yes  Historical Provider, MD  carvedilol (COREG) 25 MG tablet Take 25 mg by mouth 2 (two) times daily with a meal. Increase in dose 11/16/10 11/16/11 Yes Gerrit Friends. Dietrich Pates, MD  ferrous gluconate (FERGON) 324 MG tablet Take 324 mg by mouth daily with breakfast.     Yes Historical Provider, MD  furosemide (LASIX) 80 MG tablet Take 1 tablet (80 mg total) by mouth 2 (two) times daily. Call for increased weight 11/16/10 11/16/11 Yes Gerrit Friends. Rothbart, MD  levothyroxine (SYNTHROID, LEVOTHROID) 112 MCG tablet 300 mcg daily.   11/03/10 11/17/10 Yes Nimish C Gosrani  levothyroxine (SYNTHROID, LEVOTHROID) 300 MCG tablet Take 300 mcg by mouth daily.     Yes Historical Provider, MD  loratadine (CLARITIN REDITABS) 10 MG dissolvable tablet Take 10 mg by mouth daily.     Yes Historical Provider, MD  Multiple Vitamins-Minerals (CENTRUM SILVER ULTRA WOMENS PO) Take 1 tablet by mouth daily.     Yes Historical Provider, MD  potassium chloride SA (K-DUR,KLOR-CON) 20 MEQ tablet Take 2 tablets (40 mEq total) by mouth 2 (two) times daily. 11/16/10 11/16/11 Yes Gerrit Friends. Rothbart, MD  quinapril (ACCUPRIL) 10 MG tablet Take 10 mg by mouth daily.     Yes Historical Provider, MD  ranitidine (ZANTAC) 150 MG tablet Take 150 mg by mouth 2 (two) times daily as needed. For gastric reflux    Yes Historical Provider, MD  rosuvastatin (CRESTOR) 20 MG tablet Take  20 mg by mouth daily.     Yes Historical Provider, MD  spironolactone (ALDACTONE) 25 MG tablet Take 1 tablet (25 mg total) by mouth daily. 11/03/10 11/03/11 Yes Nimish C Gosrani    Allergies:  Allergies  Allergen Reactions  . Penicillins Shortness Of Breath    Hair loss  . Iodinated Diagnostic Agents Hives and Other (See Comments)    Pulmonary problems; no frank respiratory arrest  . Sulfa Antibiotics Other (See Comments)    Pt can't remember reaction    Past Medical History  Diagnosis Date  . Hypertension     09/2010-normal CMet and CBC; Lipid profile-116, 88, 25, 73  .  Cardiomyopathy 08/2010    Presented with congestive heart failure  . Gastroesophageal reflux disease   . Hypothyroidism     Recent TSH was normal.  . Pneumonia   . Obesity   . CHF (congestive heart failure)     History reviewed. No pertinent past surgical history.  Social History:  reports that she has been smoking Cigarettes.  She has a 10 pack-year smoking history. She does not have any smokeless tobacco history on file. She reports that she does not drink alcohol or use illicit drugs.  Family History:  Family History  Problem Relation Age of Onset  . Cardiomyopathy Mother     ICD pacemaker-ischmic CM  . Heart failure Mother   . Hypertension Mother   . Cardiomyopathy Father     Deceased  . Coronary artery disease Father   . Heart failure Father     Review of Systems  Constitutional: Positive for malaise/fatigue.  HENT: Negative.   Eyes: Negative.   Respiratory: Positive for cough and shortness of breath.   Cardiovascular: Negative.   Gastrointestinal: Positive for heartburn.  Genitourinary: Negative.   Musculoskeletal: Positive for joint pain.  Skin: Negative.   Neurological: Positive for weakness.  Endo/Heme/Allergies: Negative.   Psychiatric/Behavioral: Negative.     Blood pressure 98/73, pulse 98, temperature 98.3 F (36.8 C), temperature source Oral, resp. rate 27, SpO2 99.00%. Physical Exam  Vitals reviewed. Constitutional: She is oriented to person, place, and time. She appears well-developed and well-nourished. No distress.  HENT:  Head: Normocephalic and atraumatic.  Mouth/Throat: Oropharynx is clear and moist. No oropharyngeal exudate.  Eyes: EOM are normal. Pupils are equal, round, and reactive to light. Right eye exhibits no discharge. Left eye exhibits no discharge. No scleral icterus.  Neck: Normal range of motion. Neck supple. No JVD present. No tracheal deviation present. No thyromegaly present.  Cardiovascular: Normal rate, regular rhythm and  normal heart sounds.  Exam reveals no friction rub.   No murmur heard. Pulmonary/Chest: No stridor.  Abdominal: Soft. Bowel sounds are normal. She exhibits no distension and no mass. There is no tenderness. There is no rebound and no guarding.  Musculoskeletal: Normal range of motion.  Lymphadenopathy:    She has no cervical adenopathy.  Neurological: She is alert and oriented to person, place, and time. She has normal strength. No cranial nerve deficit or sensory deficit.  Skin: Skin is warm and dry. No rash noted. She is not diaphoretic. No erythema. No pallor.  Psychiatric: She has a normal mood and affect.     Results for orders placed during the hospital encounter of 11/16/10 (from the past 48 hour(s))  POCT I-STAT TROPONIN I     Status: Normal   Collection Time   11/17/10 12:23 AM      Component Value Range Comment   Troponin i,  poc 0.00  0.00 - 0.08 (ng/mL)    Comment 3            POCT I-STAT, CHEM 8     Status: Abnormal   Collection Time   11/17/10 12:26 AM      Component Value Range Comment   Sodium 139  135 - 145 (mEq/L)    Potassium 5.0  3.5 - 5.1 (mEq/L)    Chloride 103  96 - 112 (mEq/L)    BUN 24 (*) 6 - 23 (mg/dL)    Creatinine, Ser 1.61  0.50 - 1.10 (mg/dL)    Glucose, Bld 096 (*) 70 - 99 (mg/dL)    Calcium, Ion 0.45 (*) 1.12 - 1.32 (mmol/L)    TCO2 27  0 - 100 (mmol/L)    Hemoglobin 13.3  12.0 - 15.0 (g/dL)    HCT 40.9  81.1 - 91.4 (%)   PRO B NATRIURETIC PEPTIDE     Status: Abnormal   Collection Time   11/17/10 12:31 AM      Component Value Range Comment   BNP, POC 963.8 (*) 0 - 125 (pg/mL)   CBC     Status: Abnormal   Collection Time   11/17/10 12:32 AM      Component Value Range Comment   WBC 8.5  4.0 - 10.5 (K/uL)    RBC 4.43  3.87 - 5.11 (MIL/uL)    Hemoglobin 12.0  12.0 - 15.0 (g/dL)    HCT 78.2  95.6 - 21.3 (%)    MCV 84.0  78.0 - 100.0 (fL)    MCH 27.1  26.0 - 34.0 (pg)    MCHC 32.3  30.0 - 36.0 (g/dL)    RDW 08.6 (*) 57.8 - 15.5 (%)    Platelets 222   150 - 400 (K/uL)   DIFFERENTIAL     Status: Normal   Collection Time   11/17/10 12:32 AM      Component Value Range Comment   Neutrophils Relative 57  43 - 77 (%)    Neutro Abs 4.8  1.7 - 7.7 (K/uL)    Lymphocytes Relative 28  12 - 46 (%)    Lymphs Abs 2.4  0.7 - 4.0 (K/uL)    Monocytes Relative 11  3 - 12 (%)    Monocytes Absolute 0.9  0.1 - 1.0 (K/uL)    Eosinophils Relative 3  0 - 5 (%)    Eosinophils Absolute 0.3  0.0 - 0.7 (K/uL)    Basophils Relative 1  0 - 1 (%)    Basophils Absolute 0.1  0.0 - 0.1 (K/uL)   APTT     Status: Normal   Collection Time   11/17/10 12:32 AM      Component Value Range Comment   aPTT 24  24 - 37 (seconds)   PROTIME-INR     Status: Normal   Collection Time   11/17/10 12:32 AM      Component Value Range Comment   Prothrombin Time 14.3  11.6 - 15.2 (seconds)    INR 1.09  0.00 - 1.49     Dg Chest 1 View  11/17/2010  *RADIOLOGY REPORT*  Clinical Data: Chest pain and shortness of breath.  History of cardiomyopathy and CHF.  CHEST - 1 VIEW  Comparison: 11/03/2010  Findings: Cardiac enlargement with normal pulmonary vascularity. Probable emphysematous changes in the upper lungs.  No focal airspace consolidation.  No blunting of costophrenic angles.  No pneumothorax.  IMPRESSION: Cardiac enlargement.  No evidence  of active pulmonary disease.  Original Report Authenticated By: Marlon Pel, M.D.   EKG was done, which shows sinus rhythm at 93, with a normal axis. Intervals appear to be in the normal range. No Q waves. No concerning ST or T-wave changes are noted.  Assessment/Plan  Principal Problem:  *Hypotension Active Problems:  Numbness and tingling of right arm and leg  Systolic CHF, chronic   #1 weakness, and hypotension: I think her weakness may be because of the hypotension. The hypotension is probably because of medications. The dosage of her Coreg was increased last week. At this time what we will do is to hold off on some of her current  medications. We'll monitor her in the step down unit. In the next few hours cardiology should be in house and they will evaluate the patient and can adjust her medications at that time. We will however continue with her diuretics with holding parameters.  #2 right-sided numbness: This was transient and lasted 2 hours. This could be indicated of a TIA. We will proceed with a CT of the head. Then we will obtain MRI of the brain, MRA of the head. We'll get carotid Dopplers as well. Aspirin will be initiated for now. PTOT consultation may be considered.  #3 chronic systolic CHF. She is well compensated at this time.  Rest of her medical issues are all stable.  Further management decisions will depend on results of further testing and patient's response to treatment.  Melanie Cordova 11/17/2010, 2:47 AM

## 2010-11-18 LAB — CARDIAC PANEL(CRET KIN+CKTOT+MB+TROPI)
CK, MB: 2 ng/mL (ref 0.3–4.0)
Relative Index: INVALID (ref 0.0–2.5)
Total CK: 26 U/L (ref 7–177)
Troponin I: <0.3 ng/mL (ref <0.30)

## 2010-11-18 MED ORDER — ASPIRIN 325 MG PO TBEC: 325.0000 mg | DELAYED_RELEASE_TABLET | Freq: Every day | ORAL | Status: AC

## 2010-11-18 MED ORDER — QUINAPRIL HCL 10 MG PO TABS: 10.0000 mg | ORAL_TABLET | Freq: Every day | ORAL | Status: DC

## 2010-11-18 MED ORDER — CARVEDILOL 12.5 MG PO TABS: 12.5000 mg | ORAL_TABLET | Freq: Two times a day (BID) | ORAL | Status: DC

## 2010-11-18 MED ORDER — OMEGA-3-ACID ETHYL ESTERS 1 G PO CAPS: 2.0000 g | ORAL_CAPSULE | Freq: Two times a day (BID) | ORAL | Status: DC

## 2010-11-18 MED ORDER — OMEGA-3 FATTY ACIDS 1000 MG PO CAPS
3.0000 g | ORAL_CAPSULE | Freq: Three times a day (TID) | ORAL | Status: DC
  Filled 2010-11-18 (×4): qty 3

## 2010-11-18 NOTE — Progress Notes (Signed)
Pt for possible d/c today. Notified [facility] of d/c and to resume hh orders.

## 2010-11-18 NOTE — Progress Notes (Signed)
UR Chart Review Completed  

## 2010-11-19 ENCOUNTER — Emergency Department (HOSPITAL_COMMUNITY)
Admission: EM | Admit: 2010-11-19 | Discharge: 2010-11-19 | Disposition: A | Discharge status: Home / Self Care | Payer: Self-pay | Attending: Emergency Medicine | Admitting: Emergency Medicine

## 2010-11-19 ENCOUNTER — Encounter (HOSPITAL_COMMUNITY): Payer: Self-pay | Admitting: *Deleted

## 2010-11-19 ENCOUNTER — Emergency Department (HOSPITAL_COMMUNITY): Payer: Self-pay

## 2010-11-19 ENCOUNTER — Other Ambulatory Visit: Payer: Self-pay

## 2010-11-19 DIAGNOSIS — K219 Gastro-esophageal reflux disease without esophagitis: Secondary | ICD-10-CM | POA: Insufficient documentation

## 2010-11-19 DIAGNOSIS — R079 Chest pain, unspecified: Secondary | ICD-10-CM | POA: Insufficient documentation

## 2010-11-19 DIAGNOSIS — Z880 Allergy status to penicillin: Secondary | ICD-10-CM | POA: Insufficient documentation

## 2010-11-19 DIAGNOSIS — IMO0002 Reserved for concepts with insufficient information to code with codable children: Secondary | ICD-10-CM | POA: Insufficient documentation

## 2010-11-19 DIAGNOSIS — X58XXXA Exposure to other specified factors, initial encounter: Secondary | ICD-10-CM | POA: Insufficient documentation

## 2010-11-19 DIAGNOSIS — E039 Hypothyroidism, unspecified: Secondary | ICD-10-CM | POA: Insufficient documentation

## 2010-11-19 DIAGNOSIS — F17200 Nicotine dependence, unspecified, uncomplicated: Secondary | ICD-10-CM | POA: Insufficient documentation

## 2010-11-19 DIAGNOSIS — Z882 Allergy status to sulfonamides status: Secondary | ICD-10-CM | POA: Insufficient documentation

## 2010-11-19 DIAGNOSIS — I10 Essential (primary) hypertension: Secondary | ICD-10-CM | POA: Insufficient documentation

## 2010-11-19 DIAGNOSIS — J45909 Unspecified asthma, uncomplicated: Secondary | ICD-10-CM | POA: Insufficient documentation

## 2010-11-19 DIAGNOSIS — I428 Other cardiomyopathies: Secondary | ICD-10-CM | POA: Insufficient documentation

## 2010-11-19 DIAGNOSIS — I509 Heart failure, unspecified: Secondary | ICD-10-CM | POA: Insufficient documentation

## 2010-11-19 LAB — BASIC METABOLIC PANEL
BUN: 12 mg/dL (ref 6–23)
Calcium: 9 mg/dL (ref 8.4–10.5)
Creatinine, Ser: 0.67 mg/dL (ref 0.50–1.10)
GFR calc Af Amer: >60 mL/min (ref >60)
GFR calc non Af Amer: >60 mL/min (ref >60)

## 2010-11-19 LAB — DIFFERENTIAL
Basophils Relative: 0 % (ref 0–1)
Eosinophils Absolute: 0.3 10*3/uL (ref 0.0–0.7)
Monocytes Absolute: 0.9 10*3/uL (ref 0.1–1.0)
Monocytes Relative: 9 % (ref 3–12)

## 2010-11-19 LAB — CBC
HCT: 37.1 % (ref 36.0–46.0)
Hemoglobin: 11.7 g/dL — ABNORMAL LOW (ref 12.0–15.0)
MCH: 26.7 pg (ref 26.0–34.0)
MCHC: 31.5 g/dL (ref 30.0–36.0)

## 2010-11-19 MED ORDER — ONDANSETRON HCL 4 MG/2ML IJ SOLN
4.0000 mg | Freq: Once | INTRAMUSCULAR | Status: AC
  Administered 2010-11-19: 4 mg via INTRAVENOUS
  Filled 2010-11-19: qty 2

## 2010-11-19 MED ORDER — SODIUM CHLORIDE 0.9 % IV BOLUS (SEPSIS): 1000.0000 mL | Freq: Once | INTRAVENOUS | Status: DC

## 2010-11-19 MED ORDER — IPRATROPIUM BROMIDE 0.02 % IN SOLN
0.5000 mg | Freq: Once | RESPIRATORY_TRACT | Status: AC
  Administered 2010-11-19: 0.5 mg via RESPIRATORY_TRACT
  Filled 2010-11-19: qty 2.5

## 2010-11-19 MED ORDER — MORPHINE SULFATE 4 MG/ML IJ SOLN
4.0000 mg | Freq: Once | INTRAMUSCULAR | Status: AC
  Administered 2010-11-19: 4 mg via INTRAVENOUS
  Filled 2010-11-19: qty 1

## 2010-11-19 MED ORDER — ALBUTEROL SULFATE (5 MG/ML) 0.5% IN NEBU
2.5000 mg | INHALATION_SOLUTION | Freq: Once | RESPIRATORY_TRACT | Status: AC
  Administered 2010-11-19: 2.5 mg via RESPIRATORY_TRACT
  Filled 2010-11-19: qty 0.5

## 2010-11-19 NOTE — ED Provider Notes (Signed)
History   Scribed for Melanie Bailiff, MD, the patient was seen in room APA02/APA02. This chart was scribed by Clarita Crane. This patient's care was started at 7:16PM.  CSN: [id number] Arrival date & time: 11/19/2010  6:08 PM  Chief Complaint  Patient presents with  . Chest Pain   HPI [place] Melanie Cordova is a 50 y.o. female who presents to the Emergency Department complaining of constant left sided chest pain onset last night and persistent since following onset of left shoulder pain with associated fatigue and SOB. Denies abdominal pain, HA, numbness, tingling, palpitations. Patient reports she was d/c from hospital last night and pain began following discharge. States left shoulder pain is aggravated with movement and chest pain is aggravated with palpation, both are relieved by nothing. Patient given 4x81mg  aspiring en route by EMS. Notes chest pain not relieved with administration of NTG. Notes she had her medications changed during her hospitalization. Patient with h/o CHF, hypertension, cardiomyopathy, GERD, Hypothyroidism, pneumonia, Asthma.   HPI ELEMENTS: Location: left side chest Onset: last night Duration: persistent since onset  Timing: constant  Quality: "Painful" Modifying factors: aggravated by palpation  Context:  as above  Associated symptoms: +SOB, fatigue, left shoulder pain. Denies abdominal pain, HA, numbness, tingling, palpitations.  PAST MEDICAL HISTORY:  Past Medical History  Diagnosis Date  . Hypertension     09/2010-normal CMet and CBC; Lipid profile-116, 88, 25, 73  . Cardiomyopathy 08/2010    Presented with congestive heart failure  . Gastroesophageal reflux disease   . Hypothyroidism     Recent TSH was normal.  . Pneumonia   . Obesity   . CHF (congestive heart failure)     PAST SURGICAL HISTORY:  History reviewed. No pertinent past surgical history.  MEDICATIONS:  Previous Medications   ALBUTEROL SULFATE (VENTOLIN HFA IN)    Inhale 2 puffs into the lungs  every 4 (four) hours as needed. Shortness of breath    ALPHA-D-GALACTOSIDASE (BEANO PO)    Take by mouth daily.     ASPIRIN EC 325 MG EC TABLET    Take 1 tablet (325 mg total) by mouth daily.   CALCIUM CARBONATE-VITAMIN D (CALTRATE 600+D) 600-400 MG-UNIT PER CHEW TABLET    Chew 1 tablet by mouth 2 (two) times daily with a meal.     CARVEDILOL (COREG) 12.5 MG TABLET    Take 1 tablet (12.5 mg total) by mouth 2 (two) times daily with a meal.   FERROUS GLUCONATE (FERGON) 324 MG TABLET    Take 324 mg by mouth daily with breakfast.     FISH OIL-OMEGA-3 FATTY ACIDS 1000 MG CAPSULE    Take 3 g by mouth 3 (three) times daily.     FUROSEMIDE (LASIX) 80 MG TABLET    Take 1 tablet (80 mg total) by mouth 2 (two) times daily. Call for increased weight   LEVOTHYROXINE (SYNTHROID, LEVOTHROID) 300 MCG TABLET    Take 300 mcg by mouth daily.     LORATADINE (CLARITIN REDITABS) 10 MG DISSOLVABLE TABLET    Take 10 mg by mouth daily.     LORATADINE (CLARITIN) 10 MG TABLET    Take 10 mg by mouth daily.     MULTIPLE VITAMINS-MINERALS (CENTRUM SILVER ULTRA WOMENS PO)    Take 1 tablet by mouth daily.     POTASSIUM CHLORIDE SA (K-DUR,KLOR-CON) 20 MEQ TABLET    Take 2 tablets (40 mEq total) by mouth 2 (two) times daily.   QUINAPRIL (ACCUPRIL) 10 MG TABLET  Take 1 tablet (10 mg total) by mouth daily.   RANITIDINE (ZANTAC) 150 MG TABLET    Take 150 mg by mouth 2 (two) times daily as needed. For gastric reflux   ROSUVASTATIN (CRESTOR) 20 MG TABLET    Take 20 mg by mouth daily.     SPIRONOLACTONE (ALDACTONE) 25 MG TABLET    Take 1 tablet (25 mg total) by mouth daily.     ALLERGIES:  Allergies as of 11/19/2010 - Review Complete 11/19/2010  Allergen Reaction Noted  . Penicillins Shortness Of Breath 05/04/2010  . Iodinated diagnostic agents Hives and Other (See Comments) 09/24/2010  . Sulfa antibiotics Other (See Comments) 05/04/2010     FAMILY HISTORY:  Family History  Problem Relation Age of Onset  . Cardiomyopathy  Mother     ICD pacemaker-ischmic CM  . Heart failure Mother   . Hypertension Mother   . Cardiomyopathy Father     Deceased  . Coronary artery disease Father   . Heart failure Father   . Heart failure Brother   . Hypertension Brother      SOCIAL HISTORY: History   Social History  . Marital Status: Single    Spouse Name: N/A    Number of Children: 2  . Years of Education: N/A   Occupational History  .      Works in Science writer   Social History Main Topics  . Smoking status: Current Everyday Smoker -- 0.5 packs/day for 20 years    Types: Cigarettes    Last Attempt to Quit: 09/03/2010  . Smokeless tobacco: None  . Alcohol Use: No  . Drug Use: No  . Sexually Active: No   Other Topics Concern  . None   Social History Narrative  . None      Review of Systems 10 Systems reviewed and are negative for acute change except as noted in the HPI.  Physical Exam  BP 91/66  Pulse 101  Temp(Src) 98.4 F (36.9 C) (Oral)  Resp 22  Ht 5' 7.5" (1.715 m)  Wt 265 lb (120.203 kg)  BMI 40.89 kg/m2  SpO2 98%  Physical Exam  Nursing note and vitals reviewed. Constitutional: She is oriented to person, place, and time. She appears well-developed and well-nourished.  HENT:  Head: Normocephalic and atraumatic.  Eyes: EOM are normal.  Neck: Neck supple.  Cardiovascular: Normal rate and regular rhythm.  Exam reveals no gallop and no friction rub.   No murmur heard. Pulmonary/Chest: Effort normal. She has wheezes.  Abdominal: Soft. Bowel sounds are normal. She exhibits no distension. There is no tenderness.  Musculoskeletal: Normal range of motion. She exhibits no edema.       Tenderness to palpation over left shoulder. Pain with ROM of left shoulder.   Neurological: She is alert and oriented to person, place, and time. No sensory deficit.  Skin: Skin is warm and dry.  Psychiatric: She has a normal mood and affect. Her behavior is normal.    ED Course   Procedures  OTHER DATA REVIEWED: Nursing notes, vital signs, and past medical records reviewed. Lab results reviewed and considered Imaging results reviewed and considered  DIAGNOSTIC STUDIES: Oxygen Saturation is 99% on room air, normal by my interpretation.    LABS / RADIOLOGY: Results for orders placed during the hospital encounter of 11/19/10  CBC      Component Value Range   WBC 9.9  4.0 - 10.5 (K/uL)   RBC 4.39  3.87 - 5.11 (MIL/uL)  Hemoglobin 11.7 (*) 12.0 - 15.0 (g/dL)   HCT 16.1  09.6 - 04.5 (%)   MCV 84.5  78.0 - 100.0 (fL)   MCH 26.7  26.0 - 34.0 (pg)   MCHC 31.5  30.0 - 36.0 (g/dL)   RDW 40.9 (*) 81.1 - 15.5 (%)   Platelets 207  150 - 400 (K/uL)  DIFFERENTIAL      Component Value Range   Neutrophils Relative 69  43 - 77 (%)   Neutro Abs 6.8  1.7 - 7.7 (K/uL)   Lymphocytes Relative 19  12 - 46 (%)   Lymphs Abs 1.8  0.7 - 4.0 (K/uL)   Monocytes Relative 9  3 - 12 (%)   Monocytes Absolute 0.9  0.1 - 1.0 (K/uL)   Eosinophils Relative 3  0 - 5 (%)   Eosinophils Absolute 0.3  0.0 - 0.7 (K/uL)   Basophils Relative 0  0 - 1 (%)   Basophils Absolute 0.0  0.0 - 0.1 (K/uL)  BASIC METABOLIC PANEL      Component Value Range   Sodium 137  135 - 145 (mEq/L)   Potassium 4.0  3.5 - 5.1 (mEq/L)   Chloride 101  96 - 112 (mEq/L)   CO2 30  19 - 32 (mEq/L)   Glucose, Bld 114 (*) 70 - 99 (mg/dL)   BUN 12  6 - 23 (mg/dL)   Creatinine, Ser 9.14  0.50 - 1.10 (mg/dL)   Calcium 9.0  8.4 - 78.2 (mg/dL)   GFR calc non Af Amer >60  >60 (mL/min)   GFR calc Af Amer >60  >60 (mL/min)  POCT I-STAT TROPONIN I      Component Value Range   Troponin i, poc 0.00  0.00 - 0.08 (ng/mL)   Comment 3            Dg Chest 2 View  11/19/2010  *RADIOLOGY REPORT*  Clinical Data: Left-sided chest pain, fatigue  CHEST - 2 VIEW  Comparison: Portable chest x-ray of 11/17/2010  Findings: Moderate cardiomegaly is again noted.  There does appear to be mild pulmonary vascular congestion present.  No focal  infiltrate is seen.  No effusion is noted.  No acute bony abnormality is seen.  IMPRESSION: Stable cardiomegaly.  Question mild pulmonary vascular congestion.  Original Report Authenticated By: Juline Patch, M.D.    PROCEDURES:   Date: 11/19/2010  Rate: 91  Rhythm: normal sinus rhythm and premature ventricular contractions (PVC)  QRS Axis: normal  Intervals: QT prolonged  ST/T Wave abnormalities: nonspecific T wave changes  Conduction Disutrbances:none  Narrative Interpretation: unremarkable and stable ecg  Old EKG Reviewed: unchanged  ED COURSE / COORDINATION OF CARE: Orders Placed This Encounter  Procedures  . DG Chest 2 View  . CBC  . Differential  . Basic metabolic panel  . POCT i-Stat troponin I  8:45PM- Patient informed of current labs and imaging.  10:29PM- Patient informed of all labs and imaging. Informed of intent to d/c. Patient explained probable cause of shoulder and chest pain. Patient agrees with plan set forth at this time.  MDM: Differential Diagnosis: chest wall pain/shoulder strain, asthma exacerbation. The patient was recently discharged where she was fully evaluated for syncope, chest pain. She received numerous studies including serial markers, MRIs, ultrasounds. All these were reviewed and were unremarkable. The patient was discharged and developed the pain again at home. The pain has been persistent and is reducible and worse with movement therefore am not concerned about cardiac etiology  at this time as the patient had a single negative troponin. Chest x-ray is relatively unremarkable and stable when compared to prior. She received a dose of albuterol and Atrovent with improvement of her wheezing on reassessment. Patient is no longer short of breath subjectively. At this time I feel the patient is safe for discharge. She has home health care which evaluates her daily. She is provided instructions for which to return to the emergency department was instructed to  followup with her primary care physician this week.   PLAN: Discharge The patient is to return the emergency department if there is any worsening of symptoms. I have reviewed the discharge instructions with the patient/family  CONDITION ON DISCHARGE: Improved and stable   MEDICATIONS GIVEN IN THE E.D.  Medications  sodium chloride 0.9 % bolus 1,000 mL (not administered)  loratadine (CLARITIN) 10 MG tablet (not administered)  morphine injection 4 mg (4 mg Intravenous Given 11/19/10 1931)  ondansetron (ZOFRAN) injection 4 mg (4 mg Intravenous Given 11/19/10 1931)  albuterol (PROVENTIL) (5 MG/ML) 0.5% nebulizer solution 2.5 mg (2.5 mg Nebulization Given 11/19/10 1939)  ipratropium (ATROVENT) 0.02 % nebulizer solution 0.5 mg (0.5 mg Nebulization Given 11/19/10 1939)      I personally performed the services described in this documentation, which was scribed in my presence. The recorded information has been reviewed and considered.    Melanie Bailiff, MD 11/19/10 2252

## 2010-11-19 NOTE — ED Notes (Signed)
Pt c/o pain in the left side of her chest since last night at 7 pm. Pt also c/o shortness of breath, nausea and dizziness. Pt states that the pain is worse if you mash on her chest or put pressure on it.

## 2010-11-19 NOTE — ED Notes (Signed)
i stat troponin ran for the 3rd time, istat machine failing dr notified

## 2010-11-22 NOTE — Discharge Summary (Signed)
Physician Discharge Summary  Patient ID: Melanie Cordova MRN: [id number] DOB/AGE: 1960/05/14 50 y.o.  Admit date: 11/16/2010 Discharge date: 11/22/2010  Discharge Diagnoses:  Principal Problem:  *Hypotension Active Problems:  Numbness and tingling of right arm and leg  Systolic CHF, chronic   Discharge Medication List as of 11/18/2010  9:51 AM    START taking these medications   Details  aspirin EC 325 MG EC tablet Take 1 tablet (325 mg total) by mouth daily., Starting 11/18/2010, Until Sun 11/28/10, No Print      CONTINUE these medications which have CHANGED   Details  carvedilol (COREG) 12.5 MG tablet Take 1 tablet (12.5 mg total) by mouth 2 (two) times daily with a meal., Starting 11/18/2010, Until Discontinued, No Print    quinapril (ACCUPRIL) 10 MG tablet Take 1 tablet (10 mg total) by mouth daily., Starting 11/18/2010, Until Discontinued, No Print      CONTINUE these medications which have NOT CHANGED   Details  Albuterol Sulfate (VENTOLIN HFA IN) Inhale 2 puffs into the lungs every 4 (four) hours as needed. Shortness of breath , Until Discontinued, Historical Med    Alpha-D-Galactosidase (BEANO PO) Take by mouth daily.  , Until Discontinued, Historical Med    furosemide (LASIX) 80 MG tablet Take 1 tablet (80 mg total) by mouth 2 (two) times daily. Call for increased weight, Starting 11/16/2010, Until Wed 11/16/11, Print    levothyroxine (SYNTHROID, LEVOTHROID) 300 MCG tablet Take 300 mcg by mouth daily.  , Until Discontinued, Historical Med    loratadine (CLARITIN REDITABS) 10 MG dissolvable tablet Take 10 mg by mouth daily.  , Until Discontinued, Historical Med    potassium chloride SA (K-DUR,KLOR-CON) 20 MEQ tablet Take 2 tablets (40 mEq total) by mouth 2 (two) times daily., Starting 11/16/2010, Until Wed 11/16/11, Print    ranitidine (ZANTAC) 150 MG tablet Take 150 mg by mouth 2 (two) times daily as needed. For gastric reflux, Until Discontinued, Historical Med    rosuvastatin  (CRESTOR) 20 MG tablet Take 20 mg by mouth daily.  , Until Discontinued, Historical Med    spironolactone (ALDACTONE) 25 MG tablet Take 1 tablet (25 mg total) by mouth daily., Starting 11/03/2010, Until Thu 11/03/11, Print    Calcium Carbonate-Vitamin D (CALTRATE 600+D) 600-400 MG-UNIT per chew tablet Chew 1 tablet by mouth 2 (two) times daily with a meal.  , Until Discontinued, Historical Med    ferrous gluconate (FERGON) 324 MG tablet Take 324 mg by mouth daily with breakfast.  , Until Discontinued, Historical Med    fish oil-omega-3 fatty acids 1000 MG capsule Take 3 g by mouth 3 (three) times daily.  , Until Discontinued, Historical Med    Multiple Vitamins-Minerals (CENTRUM SILVER ULTRA WOMENS PO) Take 1 tablet by mouth daily.  , Until Discontinued, Historical Med      STOP taking these medications     metoprolol (TOPROL-XL) 50 MG 24 hr tablet         Discharge Orders    Future Appointments: Provider: Department: Dept Phone: Center:   12/03/2010 2:45 PM Gerrit Friends. Dietrich Pates, MD [facility] [phone] [id number]     Future Orders Please Complete By Expires   Diet - low sodium heart healthy      Increase activity slowly      Heart Failure patients record your daily weight using the same scale at the same time of day      STOP any activity that causes chest pain, shortness of breath, dizziness, sweating, or  exessive weakness      Call MD for:  persistant dizziness or light-headedness      Call MD for:  difficulty breathing, headache or visual disturbances      Call MD for:  extreme fatigue      (HEART FAILURE PATIENTS) Call MD:  Anytime you have any of the following symptoms: 1) 3 pound weight gain in 24 hours or 5 pounds in 1 week 2) shortness of breath, with or without a dry hacking cough 3) swelling in the hands, feet or stomach 4) if you have to sleep on extra pillows at night in order to breathe.      ACE Inhibitor / ARB already ordered         Follow-up Information     Follow up with Moscow Bing, MD. (As scheduled, or sooner for signs of fluid retention or low blood pressure)    Contact information:   [address] [place] [address] [phone]          Disposition: Home or Self Care  Discharged Condition: stable  Consults:  [place]  Diagnostics:  Dg Chest 1 View  11/17/2010  *RADIOLOGY REPORT*  Clinical Data: Chest pain and shortness of breath.  History of cardiomyopathy and CHF.  CHEST - 1 VIEW  Comparison: 11/03/2010  Findings: Cardiac enlargement with normal pulmonary vascularity. Probable emphysematous changes in the upper lungs.  No focal airspace consolidation.  No blunting of costophrenic angles.  No pneumothorax.  IMPRESSION: Cardiac enlargement.  No evidence of active pulmonary disease.  Original Report Authenticated By: Marlon Pel, M.D.   Dg Chest 2 View  11/19/2010  *RADIOLOGY REPORT*  Clinical Data: Left-sided chest pain, fatigue  CHEST - 2 VIEW  Comparison: Portable chest x-ray of 11/17/2010  Findings: Moderate cardiomegaly is again noted.  There does appear to be mild pulmonary vascular congestion present.  No focal infiltrate is seen.  No effusion is noted.  No acute bony abnormality is seen.  IMPRESSION: Stable cardiomegaly.  Question mild pulmonary vascular congestion.  Original Report Authenticated By: Juline Patch, M.D.   Dg Chest 2 View  11/03/2010  *RADIOLOGY REPORT*  Clinical Data:  CHF, asthma, bronchitis, smoker, follow-up  CHEST - 2 VIEW  Comparison: 10/31/2010  Findings: Enlargement cardiac silhouette. Pulmonary vascular congestion. Improved interstitial changes since previous exam compatible with improving CHF. Minimal atherosclerotic calcification aorta. No gross pleural effusion or pneumothorax. Bones appear demineralized.  IMPRESSION: Minimal CHF, improved.  Original Report Authenticated By: Lollie Marrow, M.D.   Ct Head Wo Contrast  11/17/2010  *RADIOLOGY REPORT*   Clinical Data: Headache, right-sided numbness, hypertension  CT HEAD WITHOUT CONTRAST  Technique:  Contiguous axial images were obtained from the base of the skull through the vertex without contrast.  Comparison: MRI brain 09/05/2006  Findings: Ventricles and sulci appear symmetrical without mass effect or midline shift.  No abnormal extra-axial fluid collections.  Gray-white matter junctions are distinct.  Basal cisterns are not effaced.  No evidence of acute intracranial hemorrhage.  No depressed skull fractures.  Visualized paranasal sinuses and mastoid air cells are not opacified.  Old nasal fractures.  IMPRESSION: No evidence of acute intracranial hemorrhage, mass lesion, or acute infarct.  Original Report Authenticated By: Marlon Pel, M.D.   Mr Angiogram Head Wo Contrast  11/17/2010  *RADIOLOGY REPORT*  Clinical Data: Weakness.  Right-sided numbness.  MRA HEAD WITHOUT CONTRAST  Technique: Angiographic images of the Circle of Willis were  obtained using MRA technique without intravenous contrast.  Comparison: None.  Findings: The patient is claustrophobic and had difficulty remaining motionless for the exam.  Shortened scan times were utilized.  Widely patent internal carotid arteries.  Basilar artery patent with vertebrals codominant.  No intracranial proximal stenosis or aneurysm.  Poor flow related enhancement in the distal MCA and PCA vessels could represent intracranial atherosclerotic change.  IMPRESSION: No proximal flow reducing intracranial atherosclerotic change is observed.  There is no visible intracranial aneurysm.  Original Report Authenticated By: Elsie Stain, M.D.   Mr Brain Wo Contrast  11/17/2010  *RADIOLOGY REPORT*  Clinical Data: Right-sided numbness and weakness.  MRI HEAD WITHOUT CONTRAST  Technique:  Multiplanar, multiecho pulse sequences of the brain and surrounding structures were obtained according to standard protocol without intravenous contrast.  Comparison: None.   Findings: The patient was moderately uncooperative and could not remain motionless for the study.  Images are suboptimal.  Small or subtle lesions could be overlooked.  There is no evidence for acute infarction, intracranial hemorrhage, mass lesion, hydrocephalus, or extra-axial fluid.  There is mild atrophy and minimal chronic microvascular ischemic change.  No foci of chronic hemorrhage.  Pituitary and cerebellar tonsils unremarkable.  No midline shift.  Grossly patent carotid and basilar arteries.  There is no acute orbital, mastoid, or sinus abnormality.  IMPRESSION: Mild atrophy with minimal small vessel disease.  No acute intracranial findings.  Original Report Authenticated By: Elsie Stain, M.D.   US Carotid Duplex Bilateral  11/17/2010  *RADIOLOGY REPORT*  Clinical Data: Stroke, syncope, hypertension and tobacco use.  BILATERAL CAROTID DUPLEX ULTRASOUND  Technique: Wallace Cullens scale imaging, color Doppler and duplex ultrasound was performed of bilateral carotid and vertebral arteries in the neck.  Comparison:  None.  Criteria:  Quantification of carotid stenosis is based on velocity parameters that correlate the residual internal carotid diameter with NASCET-based stenosis levels, using the diameter of the distal internal carotid lumen as the denominator for stenosis measurement.  The following velocity measurements were obtained:                   PEAK SYSTOLIC/END DIASTOLIC RIGHT ICA:                        95/25cm/sec CCA:                        106/33cm/sec SYSTOLIC ICA/CCA RATIO:     0.9 DIASTOLIC ICA/CCA RATIO:    0.8 ECA:                        96cm/sec  LEFT ICA:                        78/26cm/sec CCA:                        18/27cm/sec SYSTOLIC ICA/CCA RATIO:     0.9 DIASTOLIC ICA/CCA RATIO:    0.9 ECA:                        89cm/sec  Findings:  RIGHT CAROTID ARTERY: Mild intimal thickening present in the common carotid artery.  There may be a hint of minimal plaque in the carotid bulb.  Otherwise no  significant focal plaque identified. There is no evidence of carotid stenosis.  RIGHT VERTEBRAL ARTERY:  Antegrade flow with normal thickening.  LEFT CAROTID ARTERY: Mild amount of predominately noncalcified plaque present at the level of the carotid bulb, proximal ICA and external carotid artery.  Estimated left ICA stenosis is less than 50%.  LEFT VERTEBRAL ARTERY:  Antegrade flow with normal wave form.  IMPRESSION: Less than 50% estimated left ICA stenosis with mild amount of plaque present.  No significant plaque at the right carotid bifurcation.  Original Report Authenticated By: Reola Calkins, M.D.   Dg Chest Portable 1 View  10/31/2010  *RADIOLOGY REPORT*  Clinical Data: Shortness of breath; history of congestive heart failure.  PORTABLE CHEST - 1 VIEW  Comparison: Chest radiograph performed 09/24/2010  Findings: The lungs are well-aerated.  Increased interstitial markings are noted, with mild bibasilar opacities and vascular congestion; these findings appear stable from multiple prior studies, raising question for chronic interstitial changes versus recurrent edema.  No definite pleural effusion or pneumothorax is seen.  The cardiomediastinal silhouette is mildly enlarged.  No acute osseous abnormalities are seen.  IMPRESSION: Increased interstitial markings, with mild bibasilar opacities, vascular congestion and mild cardiomegaly; these findings appear stable from multiple prior studies, raising question for chronic interstitial changes versus recurrent pulmonary edema.  Original Report Authenticated By: Tonia Ghent, M.D.   EKG: NSR .  Hospital Course: The patient is a 51 year old white female with multiple recent admissions for severe systolic CHF.  Her medications had recently be increased as an a outpatient by Dr. Dietrich Pates.  This time, she presented with speech difficulty and parasthesias of her right arm and leg.  Her symptoms and signs had resolved by the time she was seen in the ED.  She  was hypotensive on admission.  A workup for stroke was negative. Her symptoms were most likely secondary to transient cerebral hypoperfusion.  Her medication doses were decreased.  At the time of admission, her physical examination was non focal, she  Was amblatory, and her vital signs had stabilized.  She had no signs of CHF. Total time greater than 30 minutes.  Discharge Exam: Blood pressure 68/51, pulse 85, temperature 98 F (36.7 C), temperature source Oral, resp. rate 24, height 5\' 7"  (1.702 m), weight 129.3 kg (285 lb 0.9 oz), SpO2 98.00%.    SignedChristiane Ha 11/22/2010, 12:39 PM

## 2010-11-22 NOTE — Progress Notes (Signed)
Encounter addended by: Clarene Critchley on: 11/22/2010  9:33 AM<BR>     Documentation filed: Flowsheet VN

## 2010-12-03 ENCOUNTER — Encounter: Payer: Self-pay | Admitting: Cardiology

## 2010-12-03 ENCOUNTER — Other Ambulatory Visit: Payer: Self-pay | Admitting: Cardiology

## 2010-12-03 ENCOUNTER — Ambulatory Visit (INDEPENDENT_AMBULATORY_CARE_PROVIDER_SITE_OTHER): Payer: Self-pay | Admitting: Cardiology

## 2010-12-03 DIAGNOSIS — Z720 Tobacco use: Secondary | ICD-10-CM | POA: Insufficient documentation

## 2010-12-03 DIAGNOSIS — F17200 Nicotine dependence, unspecified, uncomplicated: Secondary | ICD-10-CM

## 2010-12-03 DIAGNOSIS — G4700 Insomnia, unspecified: Secondary | ICD-10-CM | POA: Insufficient documentation

## 2010-12-03 DIAGNOSIS — I428 Other cardiomyopathies: Secondary | ICD-10-CM

## 2010-12-03 DIAGNOSIS — E785 Hyperlipidemia, unspecified: Secondary | ICD-10-CM | POA: Insufficient documentation

## 2010-12-03 MED ORDER — ZALEPLON 10 MG PO CAPS: 10.0000 mg | ORAL_CAPSULE | Freq: Every evening | ORAL | Status: DC | PRN

## 2010-12-03 MED ORDER — CARVEDILOL 25 MG PO TABS: 25.0000 mg | ORAL_TABLET | Freq: Two times a day (BID) | ORAL | Status: DC

## 2010-12-03 MED ORDER — QUINAPRIL HCL 20 MG PO TABS: 20.0000 mg | ORAL_TABLET | Freq: Every day | ORAL | Status: DC

## 2010-12-03 NOTE — Assessment & Plan Note (Signed)
She has responded well to medical therapy.  Although blood pressure is on the low side, she is asymptomatic and is tolerating current doses of medication.  She has had a bit of a cough, but this does not sound like the typical symptoms related to ACE inhibitor.  Quinapril dosage will be increased to 20 mg q.d.  The symptoms associated with hypotension and orthostatic hypotension were described to her.  She will let us know if any of these occur.  Electrolytes and renal function will continue to be monitored, and I will see this nice woman again in one month.

## 2010-12-03 NOTE — Patient Instructions (Addendum)
**Note De-Identified Melanie Cordova Obfuscation** Your physician has recommended you make the following change in your medication: Take Coreg 25 mg twice daily, increase Quinapril to 20 mg daily, and stop taking Crestor. Also, you may take Sonate 10 mg as need at bedtime (Do not take after 4:00 am) Please use as infrequently as possible.  Your physician recommends that you return for lab work in: 1 month  Your physician recommends that you schedule a follow-up appointment in: 6 weeks

## 2010-12-03 NOTE — Assessment & Plan Note (Signed)
In the absence of known vascular disease, it is doubtful that she requires treatment with rosuvastatin.  That medication will be held and a lipid profile repeated in one month.

## 2010-12-03 NOTE — Progress Notes (Signed)
HPI : Melanie Cordova returns to the office as scheduled for continuing assessment and treatment of a nonischemic cardiomyopathy.  Since her last visit, she has done fairly well.  She maintains her household and does some walking.  Exercise tolerance is decreased relative to her premorbid status, prompting her to rest more frequently.  She denies orthopnea, PND, lightheadedness or syncope.  She has experienced middle of the night awakening and poor sleep efficiency at night requiring her to nap during the day.  Her dose of carvedilol was increased at her last visit, but she just completed her previous prescription and has not yet gone to the higher dose.  Current Outpatient Prescriptions on File Prior to Visit  Medication Sig Dispense Refill  . Albuterol Sulfate (VENTOLIN HFA IN) Inhale 2 puffs into the lungs every 4 (four) hours as needed. Shortness of breath       . Alpha-D-Galactosidase (BEANO PO) Take by mouth daily.        . Calcium Carbonate-Vitamin D (CALTRATE 600+D) 600-400 MG-UNIT per chew tablet Chew 1 tablet by mouth 2 (two) times daily with a meal.        . ferrous gluconate (FERGON) 324 MG tablet Take 324 mg by mouth daily with breakfast.        . fish oil-omega-3 fatty acids 1000 MG capsule Take 3 g by mouth 3 (three) times daily.        . furosemide (LASIX) 80 MG tablet Take 1 tablet (80 mg total) by mouth 2 (two) times daily. Call for increased weight  60 tablet  3  . levothyroxine (SYNTHROID, LEVOTHROID) 300 MCG tablet Take 300 mcg by mouth daily.        Marland Kitchen loratadine (CLARITIN REDITABS) 10 MG dissolvable tablet Take 10 mg by mouth daily.        Marland Kitchen loratadine (CLARITIN) 10 MG tablet Take 10 mg by mouth daily.        . Multiple Vitamins-Minerals (CENTRUM SILVER ULTRA WOMENS PO) Take 1 tablet by mouth daily.        . potassium chloride SA (K-DUR,KLOR-CON) 20 MEQ tablet Take 2 tablets (40 mEq total) by mouth 2 (two) times daily.  120 tablet  3  . ranitidine (ZANTAC) 150 MG tablet Take 150 mg  by mouth 2 (two) times daily as needed. For gastric reflux      . spironolactone (ALDACTONE) 25 MG tablet Take 1 tablet (25 mg total) by mouth daily.  30 tablet  0     Allergies  Allergen Reactions  . Penicillins Shortness Of Breath    Hair loss  . Iodinated Diagnostic Agents Hives and Other (See Comments)    Pulmonary problems; no frank respiratory arrest  . Sulfa Antibiotics Other (See Comments)    Pt can't remember reaction      Past medical history, social history, and family history reviewed and updated.  ROS: See history of present illness.  PHYSICAL EXAM: BP 106/72  Pulse 88  Ht 5' 7.5" (1.715 m)  Wt 121.564 kg (268 lb)  BMI 41.36 kg/m2  SpO2 98% ; no orthostatic change in blood pressure General-Well developed; no acute distress Body habitus-obese Neck-No JVD; no carotid bruits Lungs-clear lung fields; resonant to percussion; decreased breath sounds at the bases Cardiovascular-normal PMI; normal S1 and S2; modest basilar systolic ejection murmur Abdomen-normal bowel sounds; soft and non-tender without masses or organomegaly Musculoskeletal-No deformities, no cyanosis or clubbing Neurologic-Normal cranial nerves; symmetric strength and tone Skin-Warm, no significant lesions Extremities-distal pulses intact; no  edema  ASSESSMENT AND PLAN:

## 2010-12-03 NOTE — Assessment & Plan Note (Signed)
Weight loss was recommended, but is clearly a secondary goal until her cardiac disease is maximally treated.

## 2010-12-04 LAB — BASIC METABOLIC PANEL
BUN: 16 mg/dL (ref 6–23)
Calcium: 9.2 mg/dL (ref 8.4–10.5)
Glucose, Bld: 107 mg/dL — ABNORMAL HIGH (ref 70–99)
Potassium: 4 mEq/L (ref 3.5–5.3)

## 2010-12-18 ENCOUNTER — Emergency Department (HOSPITAL_COMMUNITY): Payer: Self-pay

## 2010-12-18 ENCOUNTER — Encounter (HOSPITAL_COMMUNITY): Payer: Self-pay | Admitting: *Deleted

## 2010-12-18 ENCOUNTER — Other Ambulatory Visit: Payer: Self-pay

## 2010-12-18 ENCOUNTER — Emergency Department (HOSPITAL_COMMUNITY)
Admission: EM | Admit: 2010-12-18 | Discharge: 2010-12-19 | Disposition: A | Discharge status: Home / Self Care | Payer: Self-pay | Attending: Emergency Medicine | Admitting: Emergency Medicine

## 2010-12-18 DIAGNOSIS — F17200 Nicotine dependence, unspecified, uncomplicated: Secondary | ICD-10-CM | POA: Insufficient documentation

## 2010-12-18 DIAGNOSIS — R079 Chest pain, unspecified: Secondary | ICD-10-CM | POA: Insufficient documentation

## 2010-12-18 DIAGNOSIS — Z79899 Other long term (current) drug therapy: Secondary | ICD-10-CM | POA: Insufficient documentation

## 2010-12-18 LAB — CARDIAC PANEL(CRET KIN+CKTOT+MB+TROPI)
Relative Index: INVALID (ref 0.0–2.5)
Total CK: 31 U/L (ref 7–177)

## 2010-12-18 LAB — BASIC METABOLIC PANEL
Chloride: 97 mEq/L (ref 96–112)
Creatinine, Ser: 0.88 mg/dL (ref 0.50–1.10)
GFR calc Af Amer: 89 mL/min — ABNORMAL LOW (ref >90)

## 2010-12-18 LAB — CBC
MCV: 86.2 fL (ref 78.0–100.0)
Platelets: 204 10*3/uL (ref 150–400)
RDW: 19.3 % — ABNORMAL HIGH (ref 11.5–15.5)
WBC: 7.6 10*3/uL (ref 4.0–10.5)

## 2010-12-18 MED ORDER — MORPHINE SULFATE 2 MG/ML IJ SOLN
2.0000 mg | Freq: Once | INTRAMUSCULAR | Status: AC
  Administered 2010-12-19: 2 mg via INTRAVENOUS
  Filled 2010-12-18: qty 1

## 2010-12-18 MED ORDER — PANTOPRAZOLE SODIUM 40 MG IV SOLR
40.0000 mg | Freq: Once | INTRAVENOUS | Status: AC
  Administered 2010-12-19: 40 mg via INTRAVENOUS
  Filled 2010-12-18: qty 40

## 2010-12-18 MED ORDER — ONDANSETRON HCL 4 MG/2ML IJ SOLN
4.0000 mg | Freq: Once | INTRAMUSCULAR | Status: AC
  Administered 2010-12-19: 4 mg via INTRAVENOUS
  Filled 2010-12-18: qty 2

## 2010-12-18 NOTE — ED Notes (Signed)
Pt states around 10pm she felt like her heart started fluttering. Pt also c/o dull chest pain.

## 2010-12-18 NOTE — ED Notes (Signed)
Heart fluttering onset tonight

## 2010-12-19 NOTE — ED Notes (Signed)
Pt given 0.5 ml of Morphine. Bp 94/58 will recheck bp after bolus.

## 2010-12-19 NOTE — ED Provider Notes (Signed)
History     CSN: [id number] Arrival date & time: 12/18/2010 11:04 PM  No chief complaint on file.   (Consider location/radiation/quality/duration/timing/severity/associated sxs/prior treatment) HPI Comments: Seen 2311  Patient is a 50 y.o. female presenting with chest pain. The history is provided by the patient.  Chest Pain The chest pain began less than 1 hour ago. Chest pain occurs constantly. The chest pain is unchanged. Associated with: nothing. At its most intense, the pain is at 3/10. The pain is currently at 2/10. The severity of the pain is mild. The quality of the pain is described as aching. The pain does not radiate. Chest pain is worsened by certain positions. Pertinent negatives for primary symptoms include no fever, no cough, no wheezing and no palpitations. She tried nothing for the symptoms. Risk factors include sedentary lifestyle and obesity.  Her past medical history is significant for CAD and CHF.  Procedure history is positive for cardiac catheterization and echocardiogram.     Past Medical History  Diagnosis Date  . Hypertension     09/2010-normal CMet and CBC; Lipid profile-116, 88, 25, 73  . Cardiomyopathy 08/2010    Presented with congestive heart failure; EF of 15% and 2012; hypotension on medication precludes optimal dosing  . Gastroesophageal reflux disease   . Hypothyroidism     Recent TSH was normal.  . Pneumonia   . Obesity   . Hyperlipidemia   . Tobacco abuse     20 pack years    Past Surgical History  Procedure Date  . Cesarean section     X2    Family History  Problem Relation Age of Onset  . Cardiomyopathy Mother     ICD pacemaker-ischmic CM  . Heart failure Mother   . Hypertension Mother   . Cardiomyopathy Father     Deceased  . Coronary artery disease Father   . Heart failure Father   . Heart failure Brother   . Hypertension Brother     History  Substance Use Topics  . Smoking status: Current Everyday Smoker -- 0.5 packs/day  for 20 years    Types: Cigarettes    Last Attempt to Quit: 09/03/2010  . Smokeless tobacco: Not on file  . Alcohol Use: No    OB History    Grav Para Term Preterm Abortions TAB SAB Ect Mult Living                  Review of Systems  Constitutional: Negative for fever.  Respiratory: Negative for cough and wheezing.   Cardiovascular: Positive for chest pain. Negative for palpitations.  All other systems reviewed and are negative.    Allergies  Penicillins; Iodinated diagnostic agents; and Sulfa antibiotics  Home Medications   Current Outpatient Rx  Name Route Sig Dispense Refill  . CALCIUM CARBONATE-VITAMIN D 600-400 MG-UNIT PO CHEW Oral Chew 1 tablet by mouth 2 (two) times daily with a meal.      . CARVEDILOL 25 MG PO TABS Oral Take 1 tablet (25 mg total) by mouth 2 (two) times daily with a meal. 60 tablet     Hold for systolic blood pressure below 90  . FERROUS GLUCONATE 324 (38 FE) MG PO TABS Oral Take 324 mg by mouth daily with breakfast.      . FUROSEMIDE 80 MG PO TABS Oral Take 1 tablet (80 mg total) by mouth 2 (two) times daily. Call for increased weight 60 tablet 3  . LEVOTHYROXINE SODIUM 300 MCG PO TABS Oral Take  300 mcg by mouth daily.      . CENTRUM SILVER ULTRA WOMENS PO Oral Take 1 tablet by mouth daily.      Marland Kitchen POTASSIUM CHLORIDE CRYS CR 20 MEQ PO TBCR Oral Take 2 tablets (40 mEq total) by mouth 2 (two) times daily. 120 tablet 3  . QUINAPRIL HCL 20 MG PO TABS Oral Take 10 mg by mouth daily.      Marland Kitchen RANITIDINE HCL 150 MG PO TABS Oral Take 150 mg by mouth 2 (two) times daily as needed. For gastric reflux    . VENTOLIN HFA IN Inhalation Inhale 2 puffs into the lungs every 4 (four) hours as needed. Shortness of breath     . BEANO PO Oral Take by mouth daily.      . OMEGA-3 FATTY ACIDS 1000 MG PO CAPS Oral Take 3 g by mouth 3 (three) times daily.      Marland Kitchen SPIRONOLACTONE 25 MG PO TABS Oral Take 1 tablet (25 mg total) by mouth daily. 30 tablet 0  . ZALEPLON 10 MG PO CAPS  Oral Take 1 capsule (10 mg total) by mouth at bedtime as needed. For insomnia. Do not take after 4:00 am. 30 capsule 0    BP 94/58  Pulse 77  Temp(Src) 97.6 F (36.4 C) (Oral)  Resp 25  Ht 5' 7.5" (1.715 m)  Wt 255 lb (115.667 kg)  BMI 39.35 kg/m2  SpO2 97%  Physical Exam  Nursing note and vitals reviewed. Constitutional: She is oriented to person, place, and time. She appears well-developed and well-nourished. No distress.  HENT:  Head: Normocephalic.  Nose: Nose normal.  Mouth/Throat: Oropharynx is clear and moist.  Eyes: EOM are normal.  Neck: Normal range of motion. Neck supple. No JVD present.  Cardiovascular: Normal rate, normal heart sounds and intact distal pulses.   Pulmonary/Chest: Effort normal and breath sounds normal. She exhibits tenderness.       Left anterior chest tenderness with palpation  Abdominal: Soft. Bowel sounds are normal.  Musculoskeletal: Normal range of motion.  Neurological: She is alert and oriented to person, place, and time. She has normal reflexes.  Skin: Skin is warm and dry.    ED Course  Procedures (including critical care time)  Labs Reviewed  CBC - Abnormal; Notable for the following:    RDW 19.3 (*)    All other components within normal limits  BASIC METABOLIC PANEL - Abnormal; Notable for the following:    Glucose, Bld 111 (*)    GFR calc non Af Amer 76 (*)    GFR calc Af Amer 89 (*)    All other components within normal limits  CARDIAC PANEL(CRET KIN+CKTOT+MB+TROPI)   Dg Chest Portable 1 View  12/18/2010  *RADIOLOGY REPORT*  Clinical Data: Chest pain  PORTABLE CHEST - 1 VIEW  Comparison: 11/19/2010  Findings: Suboptimal evaluation due to hypoaeration, resulting in interstitial crowding and vascular prominence.  The heart size is enlarged. No pleural effusion or pneumothorax.  No definite focal consolidation.  IMPRESSION: Hypoaeration results in interstitial and vascular crowding. Cardiomegaly.  No definite focal consolidation.   Original Report Authenticated By: Waneta Martins, M.D.    Date: 12/19/2010 2301  Rate: 84  Rhythm: normal sinus rhythm and premature ventricular contractions (PVC)  QRS Axis: normal  Intervals: normal  ST/T Wave abnormalities: nonspecific ST changes  Conduction Disutrbances:incomplete LBBB, prolonged QT  Narrative Interpretation:   Old EKG Reviewed: unchanged from 11/19/10    Patient with chest pain that developed  earlier in the evening. H/o CHF and cardiomegaly. Hospitalization within the last month for similar pain. She had serial markers, MRIs [place] which were negative. Pain is reproducible with palpation making cardiac etiology less likely. Labs are unremarkable, xray with acute process. Patient improved with analgesics. Pt stable in ED with no significant deterioration in condition.Pt feels improved after observation and/or treatment in ED.Patient  understand and agree with initial ED impression and plan with expectations set for ED visit. MDM Reviewed: previous chart, nursing note and vitals Reviewed previous: labs, ECG, x-ray, MRI and ultrasound Interpretation: labs, ECG and x-ray Total time providing critical care: 40 minutes.              Nicoletta Dress. Colon Branch, MD 12/19/10 [phone]

## 2010-12-19 NOTE — ED Notes (Signed)
Pt given the other 0.16ml of morphine.

## 2010-12-23 ENCOUNTER — Other Ambulatory Visit (HOSPITAL_COMMUNITY): Payer: Self-pay

## 2010-12-23 ENCOUNTER — Encounter (HOSPITAL_COMMUNITY): Payer: Self-pay | Admitting: Oncology

## 2010-12-23 ENCOUNTER — Emergency Department (HOSPITAL_COMMUNITY): Payer: Self-pay

## 2010-12-23 ENCOUNTER — Emergency Department (HOSPITAL_COMMUNITY)
Admission: EM | Admit: 2010-12-23 | Discharge: 2010-12-23 | Disposition: A | Discharge status: Home / Self Care | Payer: Self-pay | Attending: Emergency Medicine | Admitting: Emergency Medicine

## 2010-12-23 DIAGNOSIS — I428 Other cardiomyopathies: Secondary | ICD-10-CM | POA: Insufficient documentation

## 2010-12-23 DIAGNOSIS — R0602 Shortness of breath: Secondary | ICD-10-CM | POA: Insufficient documentation

## 2010-12-23 DIAGNOSIS — K219 Gastro-esophageal reflux disease without esophagitis: Secondary | ICD-10-CM | POA: Insufficient documentation

## 2010-12-23 DIAGNOSIS — E039 Hypothyroidism, unspecified: Secondary | ICD-10-CM | POA: Insufficient documentation

## 2010-12-23 DIAGNOSIS — E785 Hyperlipidemia, unspecified: Secondary | ICD-10-CM | POA: Insufficient documentation

## 2010-12-23 DIAGNOSIS — F17200 Nicotine dependence, unspecified, uncomplicated: Secondary | ICD-10-CM | POA: Insufficient documentation

## 2010-12-23 DIAGNOSIS — I10 Essential (primary) hypertension: Secondary | ICD-10-CM | POA: Insufficient documentation

## 2010-12-23 DIAGNOSIS — J40 Bronchitis, not specified as acute or chronic: Secondary | ICD-10-CM

## 2010-12-23 DIAGNOSIS — R042 Hemoptysis: Secondary | ICD-10-CM | POA: Insufficient documentation

## 2010-12-23 LAB — DIFFERENTIAL
Eosinophils Relative: 4 % (ref 0–5)
Lymphocytes Relative: 22 % (ref 12–46)
Lymphs Abs: 1.7 10*3/uL (ref 0.7–4.0)
Monocytes Absolute: 0.7 10*3/uL (ref 0.1–1.0)
Monocytes Relative: 9 % (ref 3–12)

## 2010-12-23 LAB — CBC
HCT: 43.9 % (ref 36.0–46.0)
MCHC: 32.8 g/dL (ref 30.0–36.0)
MCV: 86.2 fL (ref 78.0–100.0)
RDW: 19.3 % — ABNORMAL HIGH (ref 11.5–15.5)

## 2010-12-23 LAB — BASIC METABOLIC PANEL
BUN: 12 mg/dL (ref 6–23)
CO2: 26 mEq/L (ref 19–32)
Calcium: 9.5 mg/dL (ref 8.4–10.5)
Creatinine, Ser: 0.74 mg/dL (ref 0.50–1.10)
Glucose, Bld: 120 mg/dL — ABNORMAL HIGH (ref 70–99)
Sodium: 139 mEq/L (ref 135–145)

## 2010-12-23 MED ORDER — AZITHROMYCIN 250 MG PO TABS: 250.0000 mg | ORAL_TABLET | Freq: Every day | ORAL | Status: AC

## 2010-12-23 NOTE — ED Provider Notes (Signed)
History     CSN: [id number] Arrival date & time: 12/23/2010  5:22 PM  Chief Complaint  Patient presents with  . Hemoptysis  . Nasal Congestion    (Consider location/radiation/quality/duration/timing/severity/associated sxs/prior treatment) Patient is a 50 y.o. female presenting with cough.  Cough This is a new problem. The current episode started yesterday (LAST NIGHT). The cough is productive of bloody sputum. There has been no fever. Pertinent negatives include no chest pain, no chills, no headaches, no sore throat, no myalgias, no shortness of breath and no wheezing. She has tried nothing for the symptoms. She is a smoker. Her past medical history is significant for bronchitis and asthma. Her past medical history does not include pneumonia.   Patient is a long-term smoker. He currently has coughing and bronchitis problems. Last evening coughed up purulent sputum. Not able to quantitate how much. Has continued to cough today but no further blood in sputum. No significant breathing problems at this time.  Past Medical History  Diagnosis Date  . Hypertension     09/2010-normal CMet and CBC; Lipid profile-116, 88, 25, 73  . Cardiomyopathy 08/2010    Presented with congestive heart failure; EF of 15% and 2012; hypotension on medication precludes optimal dosing  . Gastroesophageal reflux disease   . Hypothyroidism     Recent TSH was normal.  . Pneumonia   . Obesity   . Hyperlipidemia   . Tobacco abuse     20 pack years    Past Surgical History  Procedure Date  . Cesarean section     X2    Family History  Problem Relation Age of Onset  . Cardiomyopathy Mother     ICD pacemaker-ischmic CM  . Heart failure Mother   . Hypertension Mother   . Cardiomyopathy Father     Deceased  . Coronary artery disease Father   . Heart failure Father   . Heart failure Brother   . Hypertension Brother     History  Substance Use Topics  . Smoking status: Current Everyday Smoker -- 0.5  packs/day for 20 years    Types: Cigarettes    Last Attempt to Quit: 09/03/2010  . Smokeless tobacco: Not on file  . Alcohol Use: No    OB History    Grav Para Term Preterm Abortions TAB SAB Ect Mult Living                  Review of Systems  Constitutional: Negative for chills.  HENT: Positive for congestion. Negative for nosebleeds, sore throat and neck pain.   Respiratory: Positive for cough. Negative for shortness of breath and wheezing.   Cardiovascular: Negative for chest pain and leg swelling.  Gastrointestinal: Negative for abdominal pain.  Genitourinary: Negative for dysuria.  Musculoskeletal: Negative for myalgias and back pain.  Neurological: Negative for speech difficulty and headaches.  Hematological: Negative for adenopathy.    Allergies  Penicillins; Iodinated diagnostic agents; Iodine; and Sulfa antibiotics  Home Medications   Current Outpatient Rx  Name Route Sig Dispense Refill  . VENTOLIN HFA IN Inhalation Inhale 2 puffs into the lungs every 4 (four) hours as needed. Shortness of breath     . CALCIUM CARBONATE-VITAMIN D 600-400 MG-UNIT PO CHEW Oral Chew 1 tablet by mouth 2 (two) times daily with a meal.      . CARVEDILOL 25 MG PO TABS Oral Take 1 tablet (25 mg total) by mouth 2 (two) times daily with a meal. 60 tablet  Hold for systolic blood pressure below 90  . FERROUS GLUCONATE 324 (38 FE) MG PO TABS Oral Take 324 mg by mouth daily with breakfast.      . OMEGA-3 FATTY ACIDS 1000 MG PO CAPS Oral Take 3 g by mouth 3 (three) times daily.      . FUROSEMIDE 80 MG PO TABS Oral Take 1 tablet (80 mg total) by mouth 2 (two) times daily. Call for increased weight 60 tablet 3  . LEVOTHYROXINE SODIUM 300 MCG PO TABS Oral Take 300 mcg by mouth daily.      . CENTRUM SILVER ULTRA WOMENS PO Oral Take 1 tablet by mouth daily.      Marland Kitchen POTASSIUM CHLORIDE CRYS CR 20 MEQ PO TBCR Oral Take 2 tablets (40 mEq total) by mouth 2 (two) times daily. 120 tablet 3  . QUINAPRIL HCL  20 MG PO TABS Oral Take 10 mg by mouth daily.      Marland Kitchen RANITIDINE HCL 150 MG PO TABS Oral Take 150 mg by mouth 2 (two) times daily as needed. For gastric reflux    . SPIRONOLACTONE 25 MG PO TABS Oral Take 1 tablet (25 mg total) by mouth daily. 30 tablet 0  . BEANO PO Oral Take by mouth daily.      . AZITHROMYCIN 250 MG PO TABS Oral Take 1 tablet (250 mg total) by mouth daily. Take first 2 tablets together, then 1 every day until finished. 6 tablet 0  . ZALEPLON 10 MG PO CAPS Oral Take 1 capsule (10 mg total) by mouth at bedtime as needed. For insomnia. Do not take after 4:00 am. 30 capsule 0    BP 89/57  Pulse 83  Temp(Src) 98.6 F (37 C) (Oral)  Resp 18  Ht 5' 7.5" (1.715 m)  Wt 255 lb (115.667 kg)  BMI 39.35 kg/m2  SpO2 98%  Physical Exam  Nursing note and vitals reviewed. Constitutional: She is oriented to person, place, and time. She appears well-developed and well-nourished. No distress.  HENT:  Head: Normocephalic and atraumatic.  Mouth/Throat: Oropharynx is clear and moist.  Eyes: Conjunctivae are normal. Pupils are equal, round, and reactive to light.  Neck: Normal range of motion. Neck supple.  Cardiovascular: Normal rate, regular rhythm and normal heart sounds.   No murmur heard. Pulmonary/Chest: Breath sounds normal. No respiratory distress. She has no wheezes. She has no rales. She exhibits no tenderness.  Abdominal: Soft. Bowel sounds are normal. There is no tenderness.  Musculoskeletal: Normal range of motion. She exhibits no edema.  Neurological: She is alert and oriented to person, place, and time. No cranial nerve deficit. She exhibits normal muscle tone. Coordination normal.  Skin: Skin is warm and dry. No rash noted. No erythema. No pallor.    ED Course  Procedures (including critical care time)  Labs Reviewed  CBC - Abnormal; Notable for the following:    RDW 19.3 (*)    All other components within normal limits  BASIC METABOLIC PANEL - Abnormal; Notable  for the following:    Glucose, Bld 120 (*)    All other components within normal limits  DIFFERENTIAL   Dg Chest 2 View  12/23/2010  *RADIOLOGY REPORT*  Clinical Data: Hemoptysis, short of breath  CHEST - 2 VIEW  Comparison: 12/18/2010  Findings: Cardiac enlargement without heart failure.  Negative for pneumonia.  Negative for mass or effusion.  IMPRESSION: No active cardiopulmonary disease.  Cardiac enlargement.  Original Report Authenticated By: Camelia Phenes, M.D.  Results for orders placed during the hospital encounter of 12/23/10  CBC      Component Value Range   WBC 8.1  4.0 - 10.5 (K/uL)   RBC 5.09  3.87 - 5.11 (MIL/uL)   Hemoglobin 14.4  12.0 - 15.0 (g/dL)   HCT 04.5  40.9 - 81.1 (%)   MCV 86.2  78.0 - 100.0 (fL)   MCH 28.3  26.0 - 34.0 (pg)   MCHC 32.8  30.0 - 36.0 (g/dL)   RDW 91.4 (*) 78.2 - 15.5 (%)   Platelets 214  150 - 400 (K/uL)  DIFFERENTIAL      Component Value Range   Neutrophils Relative 65  43 - 77 (%)   Neutro Abs 5.3  1.7 - 7.7 (K/uL)   Lymphocytes Relative 22  12 - 46 (%)   Lymphs Abs 1.7  0.7 - 4.0 (K/uL)   Monocytes Relative 9  3 - 12 (%)   Monocytes Absolute 0.7  0.1 - 1.0 (K/uL)   Eosinophils Relative 4  0 - 5 (%)   Eosinophils Absolute 0.3  0.0 - 0.7 (K/uL)   Basophils Relative 1  0 - 1 (%)   Basophils Absolute 0.1  0.0 - 0.1 (K/uL)  BASIC METABOLIC PANEL      Component Value Range   Sodium 139  135 - 145 (mEq/L)   Potassium 3.6  3.5 - 5.1 (mEq/L)   Chloride 101  96 - 112 (mEq/L)   CO2 26  19 - 32 (mEq/L)   Glucose, Bld 120 (*) 70 - 99 (mg/dL)   BUN 12  6 - 23 (mg/dL)   Creatinine, Ser 9.56  0.50 - 1.10 (mg/dL)   Calcium 9.5  8.4 - 21.3 (mg/dL)   GFR calc non Af Amer >90  >90 (mL/min)   GFR calc Af Amer >90  >90 (mL/min)     1. Hemoptysis   2. Bronchitis       MDM   The coughing up of the blood may be secondary to bronchitis. Patient's hemoglobin and hematocrit is normal. Chest x-ray is negative. Not consistent with pneumonia  pneumothorax. The patient has not coughed up any blood since yesterday. BUt has continued to cough.        Shelda Jakes, MD 12/23/10 2014

## 2010-12-23 NOTE — ED Notes (Signed)
IV access x 2 attempted w/o success. 

## 2010-12-23 NOTE — ED Notes (Signed)
Pt reports an episode of coughing that resulted in producing a clot of bright red blood.  Pt also reports sinus congestion.

## 2010-12-23 NOTE — ED Notes (Signed)
Pt left er stating no needs with her daughter

## 2010-12-27 ENCOUNTER — Other Ambulatory Visit: Payer: Self-pay | Admitting: Cardiology

## 2010-12-28 LAB — BASIC METABOLIC PANEL
BUN: 18 mg/dL (ref 6–23)
Creat: 0.89 mg/dL (ref 0.50–1.10)
Glucose, Bld: 86 mg/dL (ref 70–99)
Potassium: 4.6 mEq/L (ref 3.5–5.3)

## 2010-12-28 LAB — LIPID PANEL
Cholesterol: 226 mg/dL — ABNORMAL HIGH (ref 0–200)
VLDL: 64 mg/dL — ABNORMAL HIGH (ref 0–40)

## 2010-12-29 LAB — CBC
MCHC: 34
MCV: 88.3
Platelets: 274
WBC: 8.4

## 2010-12-29 LAB — DIFFERENTIAL
Eosinophils Absolute: 0.2
Lymphocytes Relative: 23
Lymphs Abs: 1.9
Neutrophils Relative %: 67

## 2010-12-29 LAB — COMPREHENSIVE METABOLIC PANEL
BUN: 6
CO2: 29
Calcium: 9.1
Creatinine, Ser: 0.85
GFR calc non Af Amer: >60
Glucose, Bld: 102 — ABNORMAL HIGH

## 2010-12-29 LAB — URINE MICROSCOPIC-ADD ON

## 2010-12-29 LAB — URINALYSIS, ROUTINE W REFLEX MICROSCOPIC
Nitrite: NEGATIVE
Specific Gravity, Urine: 1.02
Urobilinogen, UA: 0.2

## 2010-12-29 LAB — CK TOTAL AND CKMB (NOT AT ARMC): Relative Index: INVALID

## 2011-01-14 ENCOUNTER — Other Ambulatory Visit: Payer: Self-pay | Admitting: *Deleted

## 2011-01-14 ENCOUNTER — Ambulatory Visit (INDEPENDENT_AMBULATORY_CARE_PROVIDER_SITE_OTHER): Payer: Self-pay | Admitting: Cardiology

## 2011-01-14 ENCOUNTER — Encounter: Payer: Self-pay | Admitting: Cardiology

## 2011-01-14 ENCOUNTER — Encounter: Payer: Self-pay | Admitting: *Deleted

## 2011-01-14 VITALS — BP 151/100 | HR 69 | Resp 18 | Ht 67.0 in | Wt 279.0 lb

## 2011-01-14 DIAGNOSIS — Z720 Tobacco use: Secondary | ICD-10-CM

## 2011-01-14 DIAGNOSIS — I10 Essential (primary) hypertension: Secondary | ICD-10-CM

## 2011-01-14 DIAGNOSIS — F17200 Nicotine dependence, unspecified, uncomplicated: Secondary | ICD-10-CM

## 2011-01-14 DIAGNOSIS — I428 Other cardiomyopathies: Secondary | ICD-10-CM

## 2011-01-14 MED ORDER — SPIRONOLACTONE 25 MG PO TABS: 25.0000 mg | ORAL_TABLET | Freq: Every day | ORAL | Status: DC

## 2011-01-14 MED ORDER — QUINAPRIL HCL 20 MG PO TABS: 20.0000 mg | ORAL_TABLET | Freq: Every day | ORAL | Status: DC

## 2011-01-14 MED ORDER — CARVEDILOL 25 MG PO TABS: ORAL_TABLET | ORAL | Status: DC

## 2011-01-14 NOTE — Assessment & Plan Note (Signed)
Patient continues to smoke one half pack per day.  The importance of stopping now was discussed with her.  I will initiate a more concerted quit attempt once treatment for cardiomyopathy is optimized.

## 2011-01-14 NOTE — Assessment & Plan Note (Addendum)
Patient has gained weight, but shows no evidence for increased congestive heart failure.  Ultimately, we will need a maximal effort at caloric restriction and likely consideration of bariatric surgery, if feasible from a financial standpoint.

## 2011-01-14 NOTE — Progress Notes (Signed)
HPI : Ms. Horton returns to the office as scheduled for continuing assessment and treatment of cardiomyopathy.  Since her last visit, she has been seen in the emergency department for hemoptysis, cough and sputum production.  Bronchitis was diagnosed and a course of azithromycin prescribed.  This produced no apparent benefit, and cough and sputum production persists, now 3 weeks into this illness.  Blood streaking was noted for only one or 2 days and has resolved.  She denies any medical noncompliance.  Current Outpatient Prescriptions on File Prior to Visit  Medication Sig Dispense Refill  . Alpha-D-Galactosidase (BEANO PO) Take by mouth daily.        . Calcium Carbonate-Vitamin D (CALTRATE 600+D) 600-400 MG-UNIT per chew tablet Chew 1 tablet by mouth 2 (two) times daily with a meal.        . carvedilol (COREG) 25 MG tablet Take 1 tablet (25 mg total) by mouth 2 (two) times daily with a meal.  60 tablet    . ferrous gluconate (FERGON) 324 MG tablet Take 324 mg by mouth daily with breakfast.        . fish oil-omega-3 fatty acids 1000 MG capsule Take 3 g by mouth 3 (three) times daily.        . furosemide (LASIX) 80 MG tablet Take 1 tablet (80 mg total) by mouth 2 (two) times daily. Call for increased weight  60 tablet  3  . levothyroxine (SYNTHROID, LEVOTHROID) 300 MCG tablet Take 300 mcg by mouth daily.        . Multiple Vitamins-Minerals (CENTRUM SILVER ULTRA WOMENS PO) Take 1 tablet by mouth daily.        . potassium chloride SA (K-DUR,KLOR-CON) 20 MEQ tablet Take 2 tablets (40 mEq total) by mouth 2 (two) times daily.  120 tablet  3  . quinapril (ACCUPRIL) 20 MG tablet Take 10 mg by mouth daily.        . ranitidine (ZANTAC) 150 MG tablet Take 150 mg by mouth 2 (two) times daily as needed. For gastric reflux         Allergies  Allergen Reactions  . Penicillins Anaphylaxis and Shortness Of Breath    Hair loss  . Iodinated Diagnostic Agents Hives and Other (See Comments)    Pulmonary problems;  no frank respiratory arrest  . Iodine   . Sulfa Antibiotics Other (See Comments)    Pt can't remember reaction      Past medical history, social history, and family history reviewed and updated.  ROS:  Patient notes dyspnea when bending over, but no orthopnea, PND, chest discomfort or exertional dyspnea.  She has had no lightheadedness and no syncope.  PHYSICAL EXAM: BP 151/100  Pulse 69  Resp 18  Ht 5\' 7"  (1.702 m)  Wt 279 lb (126.554 kg)  BMI 43.70 kg/m2; initial blood pressure taken with a small cuff on the forearm; repeat blood pressure 100/60       Weight gain of 9 pounds since her last visit-this apparently has occurred gradually, as it has not been apparent by patient's home weights. General-Well developed; no acute distress Body habitus-obese Neck-No JVD; no carotid bruits Lungs-clear lung fields; resonant to percussion Cardiovascular-normal PMI; normal S1 and S2; rapid rhythm; no gallop nor murmur Abdomen-normal bowel sounds; soft and non-tender without masses or organomegaly Musculoskeletal-No deformities, no cyanosis or clubbing Neurologic-Normal cranial nerves; symmetric strength and tone Skin-Warm, no significant lesions Extremities-distal pulses intact; trace edema  EKG: Normal sinus rhythm at a rate of 97  bpm with occasional PVCs; left atrial abnormality; slightly delayed R-wave progression; leftward axis; slightly prolonged QT interval; nonspecific T-wave abnormality.  No prior tracing for comparison.  ASSESSMENT AND PLAN:

## 2011-01-14 NOTE — Patient Instructions (Addendum)
Your physician recommends that you schedule a follow-up appointment in:   2 week follow up in 2 weeks for blood pressure check 2 months with Dr Dietrich Pates  Your physician has recommended you make the following change in your medication:   1 - Increase Coreg (Carvedilol) to 50 mg Twice daily 2 - Increase Quinapril to 20 mg Daily 3 - Continue taking Spironolactone 25 mg daily  Your physician recommends that you return for lab work in: 2 weeks and 6 weeks (BMET)

## 2011-01-14 NOTE — Assessment & Plan Note (Addendum)
Patient appears to be well compensated with respect to CHF despite a significant weight gain.  We will continue to titrate medications upwards, increasing carvedilol to 50 mg b.i.d and quinapril to 20 mg per day.  The symptoms of hypotension were explained to Melanie Cordova, and she was advised to call should they develop.  No testing, including coronary angiography, has been performed to exclude an ischemic etiology for left ventricular dysfunction.  While this is an unlikely etiology, she should at least undergo an exercise imaging test at some point.

## 2011-01-28 ENCOUNTER — Telehealth: Payer: Self-pay | Admitting: Cardiology

## 2011-01-28 ENCOUNTER — Ambulatory Visit (INDEPENDENT_AMBULATORY_CARE_PROVIDER_SITE_OTHER): Payer: Self-pay

## 2011-01-28 VITALS — BP 111/76 | HR 85 | Wt 275.0 lb

## 2011-01-28 DIAGNOSIS — I10 Essential (primary) hypertension: Secondary | ICD-10-CM

## 2011-01-28 NOTE — Telephone Encounter (Signed)
PT WAS SEEN THIS AM FOR BP CHECK AND TOLD TO CALL BACK IF SHE HAS ANY MORE EPISODES.  SHE HAD ONE AGAIN AND IS THROWING UP.

## 2011-01-28 NOTE — Telephone Encounter (Signed)
Advised patient to seek primary care for vomiting, as this is unlikely to be related to her blood pressure medication.  States she will see him or go to the emergency room.

## 2011-01-28 NOTE — Progress Notes (Signed)
Patient presents for a blood pressure check.  BP in office on 11/2 was 151/100 with pulse of 69 and is 111/76 with HR of 85 and has not taken medication this am.  Coreg was increased to  50 mg bid from 25 and Accupril was increased from 10 mg daily to 20 mg at last visit.  Pt sates that she has had some lightheadedness and dizzy spells since starting new regimen.  Advised her to decrease Accupril to 10 mg daily and continue Coreg since it appears the coreg is helping with her CHF symptoms and weight, as it is down 4 pounds since last visit.  Also provided pt with BP record and she will keep track of BP and any further symptoms and notify me with any further issues.  I will follow up with patient on Monday to reassess her status.

## 2011-01-29 NOTE — Progress Notes (Signed)
Lying and standing blood pressures Home blood pressures Agree with changes in medication.

## 2011-01-31 ENCOUNTER — Telehealth: Payer: Self-pay | Admitting: *Deleted

## 2011-01-31 NOTE — Telephone Encounter (Signed)
Spoke with patient regarding symptoms of dizziness and and lightheadedness.  States they have resolved and BP have been satisfactory.  Advised her to continue to monitor BP and attempt to find a ride this week for orthostatic BP in office, if feasible.

## 2011-02-07 ENCOUNTER — Ambulatory Visit (INDEPENDENT_AMBULATORY_CARE_PROVIDER_SITE_OTHER): Payer: Self-pay

## 2011-02-07 VITALS — BP 117/78 | HR 49

## 2011-02-07 DIAGNOSIS — I10 Essential (primary) hypertension: Secondary | ICD-10-CM

## 2011-02-07 NOTE — Progress Notes (Signed)
S: Pt. arrives in office for a orthostatic BP check with nurse. B: Pt. called office with c/o dizziness and lightheadedness on 01-31-11. A orthostatic BP check with nurse was scheduled for today. A: Pt. states that she has not had anymore dizziness. She does not have a BP cuff at home so she can't keep a BP diary. Orthostatic BP's are recorded in pt's chart. R: Pt. advised to check her BP's at local drug stores when she can and to write down recordings to bring to next OV, she verbalized understanding. Also, pt. Is advised that we will contact her with Dr. Marvel Plan recommendations.

## 2011-02-19 NOTE — Progress Notes (Signed)
No significant orthostatic change in BP.  Symptoms resolved.  Doing well.

## 2011-02-25 ENCOUNTER — Other Ambulatory Visit: Payer: Self-pay | Admitting: Cardiology

## 2011-02-26 LAB — BASIC METABOLIC PANEL
CO2: 26 mEq/L (ref 19–32)
Glucose, Bld: 180 mg/dL — ABNORMAL HIGH (ref 70–99)
Potassium: 3.7 mEq/L (ref 3.5–5.3)
Sodium: 141 mEq/L (ref 135–145)

## 2011-03-09 ENCOUNTER — Encounter: Payer: Self-pay | Admitting: *Deleted

## 2011-03-16 ENCOUNTER — Encounter: Payer: Self-pay | Admitting: *Deleted

## 2011-03-16 ENCOUNTER — Ambulatory Visit (INDEPENDENT_AMBULATORY_CARE_PROVIDER_SITE_OTHER): Payer: Self-pay | Admitting: Cardiology

## 2011-03-16 ENCOUNTER — Encounter: Payer: Self-pay | Admitting: Cardiology

## 2011-03-16 DIAGNOSIS — E039 Hypothyroidism, unspecified: Secondary | ICD-10-CM

## 2011-03-16 DIAGNOSIS — E785 Hyperlipidemia, unspecified: Secondary | ICD-10-CM

## 2011-03-16 DIAGNOSIS — I10 Essential (primary) hypertension: Secondary | ICD-10-CM

## 2011-03-16 DIAGNOSIS — I428 Other cardiomyopathies: Secondary | ICD-10-CM

## 2011-03-16 NOTE — Progress Notes (Signed)
Patient ID: Melanie Cordova, female   DOB: 1961-02-05, 51 y.o.   MRN: [id number] HPI: Scheduled return visit for this very nice woman with cardiomyopathy of unknown etiology.  Symptoms have been improved now that she is tolerating optimal medical therapy, but she continues to have class II dyspnea on exertion.  She experiences intermittent paresthesias and anesthesia in various parts of her body as well as discomfort in the lower extremities.  She experienced one spell of tachycardia palpitations that persisted for a number of minutes.  Prior to Admission medications   Medication Sig Start Date End Date Taking? Authorizing Provider  Alpha-D-Galactosidase (BEANO PO) Take by mouth daily.     Yes Historical Provider, MD  Calcium Carbonate-Vitamin D (CALTRATE 600+D) 600-400 MG-UNIT per chew tablet Chew 1 tablet by mouth 2 (two) times daily with a meal.     Yes Historical Provider, MD  carvedilol (COREG) 25 MG tablet Take 2 tablets twice daily 01/14/11  Yes Gerrit Friends. Anarosa Kubisiak, MD  desloratadine (CLARINEX REDITAB) 5 MG disintegrating tablet Take 5 mg by mouth daily.     Yes Historical Provider, MD  ferrous gluconate (FERGON) 324 MG tablet Take 324 mg by mouth daily with breakfast.     Yes Historical Provider, MD  fish oil-omega-3 fatty acids 1000 MG capsule Take 3 g by mouth 3 (three) times daily.     Yes Historical Provider, MD  furosemide (LASIX) 80 MG tablet Take 1 tablet (80 mg total) by mouth 2 (two) times daily. Call for increased weight 11/16/10 11/16/11 Yes Gerrit Friends. Areg Bialas, MD  levothyroxine (SYNTHROID, LEVOTHROID) 300 MCG tablet Take 300 mcg by mouth daily.     Yes Historical Provider, MD  Multiple Vitamins-Minerals (CENTRUM SILVER ULTRA WOMENS PO) Take 1 tablet by mouth daily.     Yes Historical Provider, MD  potassium chloride SA (K-DUR,KLOR-CON) 20 MEQ tablet Take 2 tablets (40 mEq total) by mouth 2 (two) times daily. 11/16/10 11/16/11 Yes Gerrit Friends. Faythe Heitzenrater, MD  quinapril (ACCUPRIL) 20 MG tablet  Take 1 tablet (20 mg total) by mouth at bedtime. 01/14/11 01/14/12 Yes Gerrit Friends. Zimal Weisensel, MD  ranitidine (ZANTAC) 150 MG tablet Take 150 mg by mouth 2 (two) times daily as needed. For gastric reflux   Yes Historical Provider, MD  spironolactone (ALDACTONE) 25 MG tablet Take 1 tablet (25 mg total) by mouth daily. 01/14/11 01/14/12 Yes Gerrit Friends. Dietrich Pates, MD    Allergies  Allergen Reactions  . Penicillins Anaphylaxis and Shortness Of Breath    Hair loss  . Iodinated Diagnostic Agents Hives and Other (See Comments)    Pulmonary problems; no frank respiratory arrest  . Iodine   . Sulfa Antibiotics Other (See Comments)    Pt can't remember reaction      Past medical history, social history, and family history reviewed and updated.  ROS: Denies orthopnea, PND, ankle edema, lightheadedness or syncope.  PHYSICAL EXAM: BP 110/62  Pulse 78  Ht 5' 7.5" (1.715 m)  Wt 122.925 kg (271 lb)  BMI 41.82 kg/m2; weight has decreased 7 pounds since last visit  General-Well developed; no acute distress Body habitus-obese Neck-No JVD; no carotid bruits Lungs-clear lung fields; resonant to percussion Cardiovascular-normal PMI; normal S1 and S2 Abdomen-normal bowel sounds; soft and non-tender without masses or organomegaly Musculoskeletal-No deformities, no cyanosis or clubbing Neurologic-Normal cranial nerves; symmetric strength and tone Skin-Warm, no significant lesions Extremities-distal pulses intact; no edema  ASSESSMENT AND PLAN:  Culver Bing, MD 03/16/2011 11:06 AM

## 2011-03-16 NOTE — Assessment & Plan Note (Signed)
Blood pressure is not an issue with current medical therapy for cardiomyopathy and CHF.

## 2011-03-16 NOTE — Patient Instructions (Addendum)
Your physician recommends that you schedule a follow-up appointment in: 3 1/2 months  Increase activity and continue a low salt, weight reduction diet  Your physician recommends that you return for lab work in: 1 and 3 months (you will receive a letter)  Your physician has requested that you have an echocardiogram. Echocardiography is a painless test that uses sound waves to create images of your heart. It provides your doctor with information about the size and shape of your heart and how well your heart's chambers and valves are working. This procedure takes approximately one hour. There are no restrictions for this procedure.

## 2011-03-16 NOTE — Assessment & Plan Note (Addendum)
Hypothyroidism was reassessed with a normal TSH in 12/2010.

## 2011-03-16 NOTE — Assessment & Plan Note (Signed)
Patient is congratulated on weight loss to date and encouraged to continue to restrict caloric intake in the context of a low salt diet.

## 2011-03-16 NOTE — Assessment & Plan Note (Addendum)
CHF is compensated.  Additional testing will be performed looking for unlikely etiologies to include measurement of a ferritin level and an human immunodeficiency virus antibody study.  Patient was profoundly hypothyroid in 10/2010, which certainly could account for left ventricular dysfunction.  Repeat imaging will be undertaken to determine if the ejection fraction has improved.

## 2011-03-16 NOTE — Assessment & Plan Note (Signed)
Patient had very low lipid values in the past when treated with rosuvastatin.  She is not currently taking any lipid lowering therapy except fish oil.  We will recheck a lipid profile.

## 2011-03-22 ENCOUNTER — Ambulatory Visit (HOSPITAL_COMMUNITY)
Admission: RE | Admit: 2011-03-22 | Discharge: 2011-03-22 | Disposition: A | Discharge status: Home / Self Care | Payer: Self-pay | Source: Ambulatory Visit | Attending: Cardiology | Admitting: Cardiology

## 2011-03-22 DIAGNOSIS — E785 Hyperlipidemia, unspecified: Secondary | ICD-10-CM | POA: Insufficient documentation

## 2011-03-22 DIAGNOSIS — I428 Other cardiomyopathies: Secondary | ICD-10-CM | POA: Insufficient documentation

## 2011-03-22 DIAGNOSIS — I10 Essential (primary) hypertension: Secondary | ICD-10-CM | POA: Insufficient documentation

## 2011-03-22 DIAGNOSIS — I059 Rheumatic mitral valve disease, unspecified: Secondary | ICD-10-CM

## 2011-03-22 DIAGNOSIS — Z6841 Body Mass Index (BMI) 40.0 and over, adult: Secondary | ICD-10-CM | POA: Insufficient documentation

## 2011-03-22 NOTE — Progress Notes (Signed)
*  PRELIMINARY RESULTS* Echocardiogram 2D Echocardiogram has been performed.  Melanie Cordova [place] 03/22/2011, 8:26 AM

## 2011-03-29 ENCOUNTER — Other Ambulatory Visit (HOSPITAL_COMMUNITY)
Admission: RE | Admit: 2011-03-29 | Discharge: 2011-03-29 | Disposition: A | Discharge status: Home / Self Care | Payer: Self-pay | Source: Ambulatory Visit | Attending: Unknown Physician Specialty | Admitting: Unknown Physician Specialty

## 2011-03-29 ENCOUNTER — Other Ambulatory Visit: Payer: Self-pay | Admitting: Nurse Practitioner

## 2011-03-29 DIAGNOSIS — R87612 Low grade squamous intraepithelial lesion on cytologic smear of cervix (LGSIL): Secondary | ICD-10-CM | POA: Insufficient documentation

## 2011-03-29 DIAGNOSIS — N870 Mild cervical dysplasia: Secondary | ICD-10-CM | POA: Insufficient documentation

## 2011-04-15 ENCOUNTER — Other Ambulatory Visit: Payer: Self-pay | Admitting: *Deleted

## 2011-04-15 MED ORDER — FUROSEMIDE 80 MG PO TABS: 80.0000 mg | ORAL_TABLET | Freq: Two times a day (BID) | ORAL | Status: DC

## 2011-04-20 ENCOUNTER — Emergency Department (HOSPITAL_COMMUNITY): Payer: Self-pay

## 2011-04-20 ENCOUNTER — Other Ambulatory Visit: Payer: Self-pay

## 2011-04-20 ENCOUNTER — Encounter (HOSPITAL_COMMUNITY): Payer: Self-pay | Admitting: Emergency Medicine

## 2011-04-20 ENCOUNTER — Inpatient Hospital Stay (HOSPITAL_COMMUNITY)
Admission: EM | Admit: 2011-04-20 | Discharge: 2011-04-22 | DRG: 292 | Disposition: A | Discharge status: Home / Self Care | Payer: Self-pay | Attending: Internal Medicine | Admitting: Internal Medicine

## 2011-04-20 ENCOUNTER — Other Ambulatory Visit: Payer: Self-pay | Admitting: Cardiology

## 2011-04-20 DIAGNOSIS — I5043 Acute on chronic combined systolic (congestive) and diastolic (congestive) heart failure: Principal | ICD-10-CM | POA: Diagnosis present

## 2011-04-20 DIAGNOSIS — J42 Unspecified chronic bronchitis: Secondary | ICD-10-CM | POA: Diagnosis present

## 2011-04-20 DIAGNOSIS — J209 Acute bronchitis, unspecified: Secondary | ICD-10-CM | POA: Diagnosis present

## 2011-04-20 DIAGNOSIS — I509 Heart failure, unspecified: Secondary | ICD-10-CM

## 2011-04-20 DIAGNOSIS — K219 Gastro-esophageal reflux disease without esophagitis: Secondary | ICD-10-CM | POA: Diagnosis present

## 2011-04-20 DIAGNOSIS — E039 Hypothyroidism, unspecified: Secondary | ICD-10-CM | POA: Diagnosis present

## 2011-04-20 DIAGNOSIS — Z6841 Body Mass Index (BMI) 40.0 and over, adult: Secondary | ICD-10-CM

## 2011-04-20 DIAGNOSIS — J40 Bronchitis, not specified as acute or chronic: Secondary | ICD-10-CM | POA: Diagnosis present

## 2011-04-20 DIAGNOSIS — E669 Obesity, unspecified: Secondary | ICD-10-CM | POA: Diagnosis present

## 2011-04-20 DIAGNOSIS — G4700 Insomnia, unspecified: Secondary | ICD-10-CM

## 2011-04-20 DIAGNOSIS — B9689 Other specified bacterial agents as the cause of diseases classified elsewhere: Secondary | ICD-10-CM | POA: Diagnosis present

## 2011-04-20 DIAGNOSIS — N390 Urinary tract infection, site not specified: Secondary | ICD-10-CM | POA: Diagnosis present

## 2011-04-20 DIAGNOSIS — E876 Hypokalemia: Secondary | ICD-10-CM | POA: Diagnosis present

## 2011-04-20 DIAGNOSIS — I428 Other cardiomyopathies: Secondary | ICD-10-CM | POA: Diagnosis present

## 2011-04-20 DIAGNOSIS — E785 Hyperlipidemia, unspecified: Secondary | ICD-10-CM | POA: Diagnosis present

## 2011-04-20 DIAGNOSIS — I10 Essential (primary) hypertension: Secondary | ICD-10-CM | POA: Diagnosis present

## 2011-04-20 DIAGNOSIS — I429 Cardiomyopathy, unspecified: Secondary | ICD-10-CM | POA: Diagnosis present

## 2011-04-20 DIAGNOSIS — Z720 Tobacco use: Secondary | ICD-10-CM | POA: Diagnosis present

## 2011-04-20 DIAGNOSIS — F17200 Nicotine dependence, unspecified, uncomplicated: Secondary | ICD-10-CM | POA: Diagnosis present

## 2011-04-20 HISTORY — DX: Headache: R51

## 2011-04-20 HISTORY — DX: Unspecified convulsions: R56.9

## 2011-04-20 HISTORY — DX: Unspecified osteoarthritis, unspecified site: M19.90

## 2011-04-20 HISTORY — DX: Anxiety disorder, unspecified: F41.9

## 2011-04-20 LAB — CBC
Hemoglobin: 13 g/dL (ref 12.0–15.0)
MCH: 31 pg (ref 26.0–34.0)
Platelets: 217 10*3/uL (ref 150–400)
RBC: 4.19 MIL/uL (ref 3.87–5.11)
WBC: 11.5 10*3/uL — ABNORMAL HIGH (ref 4.0–10.5)

## 2011-04-20 LAB — URINALYSIS, ROUTINE W REFLEX MICROSCOPIC
Bilirubin Urine: NEGATIVE
Hgb urine dipstick: NEGATIVE
Ketones, ur: NEGATIVE mg/dL
Nitrite: NEGATIVE
Specific Gravity, Urine: 1.02 (ref 1.005–1.030)
Urobilinogen, UA: 0.2 mg/dL (ref 0.0–1.0)

## 2011-04-20 LAB — COMPREHENSIVE METABOLIC PANEL
ALT: 47 U/L — ABNORMAL HIGH (ref 0–35)
Alkaline Phosphatase: 101 U/L (ref 39–117)
BUN: 13 mg/dL (ref 6–23)
CO2: 28 mEq/L (ref 19–32)
Chloride: 106 mEq/L (ref 96–112)
GFR calc Af Amer: >90 mL/min (ref >90)
GFR calc non Af Amer: >90 mL/min (ref >90)
Glucose, Bld: 99 mg/dL (ref 70–99)
Potassium: 4.8 mEq/L (ref 3.5–5.1)
Sodium: 140 mEq/L (ref 135–145)
Total Bilirubin: 0.7 mg/dL (ref 0.3–1.2)
Total Protein: 6.3 g/dL (ref 6.0–8.3)

## 2011-04-20 LAB — PRO B NATRIURETIC PEPTIDE: Pro B Natriuretic peptide (BNP): 1563 pg/mL — ABNORMAL HIGH (ref 0–125)

## 2011-04-20 LAB — DIFFERENTIAL
Lymphocytes Relative: 18 % (ref 12–46)
Lymphs Abs: 2.1 10*3/uL (ref 0.7–4.0)
Monocytes Relative: 5 % (ref 3–12)
Neutrophils Relative %: 74 % (ref 43–77)

## 2011-04-20 MED ORDER — FUROSEMIDE 10 MG/ML IJ SOLN
80.0000 mg | Freq: Once | INTRAMUSCULAR | Status: AC
  Administered 2011-04-20: 80 mg via INTRAVENOUS
  Filled 2011-04-20: qty 8

## 2011-04-20 MED ORDER — ALBUTEROL SULFATE (5 MG/ML) 0.5% IN NEBU
2.5000 mg | INHALATION_SOLUTION | Freq: Once | RESPIRATORY_TRACT | Status: AC
  Administered 2011-04-20: 2.5 mg via RESPIRATORY_TRACT
  Filled 2011-04-20: qty 0.5

## 2011-04-20 MED ORDER — IPRATROPIUM BROMIDE 0.02 % IN SOLN
0.5000 mg | Freq: Once | RESPIRATORY_TRACT | Status: AC
  Administered 2011-04-20: 0.5 mg via RESPIRATORY_TRACT
  Filled 2011-04-20: qty 2.5

## 2011-04-20 MED ORDER — FUROSEMIDE 80 MG PO TABS: 80.0000 mg | ORAL_TABLET | Freq: Two times a day (BID) | ORAL | Status: DC

## 2011-04-20 MED ORDER — NITROGLYCERIN 2 % TD OINT
1.0000 [in_us] | TOPICAL_OINTMENT | Freq: Once | TRANSDERMAL | Status: AC
  Administered 2011-04-20: 1 [in_us] via TOPICAL
  Filled 2011-04-20: qty 1

## 2011-04-20 NOTE — ED Notes (Addendum)
Pt c/o sob/np cough x 3 days. Pt states she ran out of her lasix/spironolactone/proventil inhaler and synthroid x 3 days ago. Pt given combivent and albuterol in route per ems. nad noted.

## 2011-04-20 NOTE — H&P (Signed)
PCP:  [name], NP, NP Chief Complaint:  Chest tightness and congestion in the lungs for about 3 days duration.  HPI:  Patient is a 51 year old Caucasian female, obese, with history of cardiomyopathy, hypertension and hypothyroidism and a chronic tobacco use presenting to the emergency room with chest tightness of about 3 days duration with associated congestion in the lungs. Patient claimed that in the past 3 days that she had been coughing and cough was nonproductive of sputum. Associated with the cough is chest tightness. She denied any palpitation. She claimed she was mildly short of breath. She denied any nausea or vomiting. She also denied any history of fever, chills or Rigors. Patient also observed said that she was having progressive swelling of the lower extremity. Symptoms  persisted hence patient presented to the hospital to be evaluated.  Review of Systems: Chest tightness with congestion in the lungs and progressive swelling of the lower extremity.  The patient denies anorexia, fever, weight loss,, vision loss, decreased hearing, hoarseness, chest pain, syncope, dyspnea on exertion, peripheral edema++, balance deficits, hemoptysis, abdominal pain, melena, hematochezia, severe indigestion/heartburn, hematuria, incontinence, genital sores, muscle weakness, suspicious skin lesions, transient blindness, difficulty walking, depression, unusual weight change, abnormal bleeding, enlarged lymph nodes, angioedema, and breast masses.  Past Medical History:  Past Medical History  Diagnosis Date  . Hypertension     09/2010-normal CMet and CBC; Lipid profile-116, 88, 25, 73  . Cardiomyopathy 08/2010    Presented with congestive heart failure; EF of 15% and 2012; hypotension on medication precludes optimal dosing  . Gastroesophageal reflux disease   . Hypothyroidism     Recent TSH was normal.  . Pneumonia   . Obesity   . Hyperlipidemia   . Tobacco abuse     20 pack years  . CHF  (congestive heart failure)     Past Surgical History  Procedure Date  . Cesarean section     X2    Medications:  Prior to Admission medications   Medication Sig Start Date End Date Taking? Authorizing Provider  albuterol (PROVENTIL HFA) 108 (90 BASE) MCG/ACT inhaler Inhale 2 puffs into the lungs every 6 (six) hours as needed. For shortness of breath   Yes Historical Provider, MD  carvedilol (COREG) 25 MG tablet Take 2 tablets twice daily 01/14/11  Yes Gerrit Friends. Rothbart, MD  desloratadine (CLARINEX) 5 MG tablet Take 5 mg by mouth daily.   Yes Historical Provider, MD  ferrous sulfate (IRON SUPPLEMENT) 325 (65 FE) MG tablet Take 325 mg by mouth daily with breakfast.   Yes Historical Provider, MD  fish oil-omega-3 fatty acids 1000 MG capsule Take 3 g by mouth 3 (three) times daily.     Yes Historical Provider, MD  furosemide (LASIX) 80 MG tablet Take 1 tablet (80 mg total) by mouth 2 (two) times daily. Call for increased weight 04/20/11 04/19/12 Yes Gerrit Friends. Rothbart, MD  ibuprofen (ADVIL,MOTRIN) 200 MG tablet Take 400 mg by mouth as needed. For pain   Yes Historical Provider, MD  levothyroxine (SYNTHROID, LEVOTHROID) 300 MCG tablet Take 300 mcg by mouth daily.     Yes Historical Provider, MD  potassium chloride SA (K-DUR,KLOR-CON) 20 MEQ tablet Take 2 tablets (40 mEq total) by mouth 2 (two) times daily. 11/16/10 11/16/11 Yes Gerrit Friends. Rothbart, MD  quinapril (ACCUPRIL) 20 MG tablet Take 1 tablet (20 mg total) by mouth at bedtime. 01/14/11 01/14/12 Yes Gerrit Friends. Rothbart, MD  ranitidine (ZANTAC) 150 MG tablet Take 150 mg by mouth 2 (  two) times daily as needed. For gastric reflux   Yes Historical Provider, MD  Alpha-D-Galactosidase (BEANO PO) Take by mouth daily.      Historical Provider, MD    Allergies:  Allergies  Allergen Reactions  . Penicillins Anaphylaxis and Shortness Of Breath    Hair loss  . Iodinated Diagnostic Agents Hives and Other (See Comments)    Pulmonary problems; no frank  respiratory arrest  . Iodine   . Sulfa Antibiotics Other (See Comments)    Pt can't remember reaction    Social History:   reports that she has been smoking Cigarettes.  She has a 10 pack-year smoking history. She does not have any smokeless tobacco history on file. She reports that she does not drink alcohol or use illicit drugs.  Family History:  Family History  Problem Relation Age of Onset  . Cardiomyopathy Mother     ICD pacemaker-ischmic CM  . Heart failure Mother   . Hypertension Mother   . Cardiomyopathy Father     Deceased  . Coronary artery disease Father   . Heart failure Father   . Heart failure Brother   . Hypertension Brother     Physical Exam:  Filed Vitals:   04/20/11 1742 04/20/11 1743 04/20/11 1800 04/20/11 1927  BP:   117/71 119/75  Pulse:   84 82  Temp:      TempSrc:      Resp:   26   Height: 5' 7.5" (1.715 m)     Weight: 122.925 kg (271 lb)     SpO2:  95% 94% 96%      General: Alert and oriented times three, obese, well developed and nourished, not in acute distress  Eyes: PERRLA, pink conjunctiva, scleral anicterus  ENT: Moist oral mucosa, neck supple, no thyromegaly  Lungs: Rales mid and lower zones of the lungs bilaterally with minimally scattered rhonchi.  Cardiovascular: regular rate and rhythm, no regurgitation, no gallops, no murmurs. No carotid bruits, no JVD  Abdomen: soft, obese, positive BS, non-tender, non-distended, no organomegaly, not an acute abdomen  GU: not examined  Neuro: No lateralizing signs  Musculoskeletal: strength 5/5 all extremities, no clubbing, +2 pedal edema  Skin: no rash, no subcutaneous crepitation, no decubitus  Psych: appropriate patient  ?  Labs on Admission:   [facility] 04/20/11 1936  NA 140  K 4.8  CL 106  CO2 28  GLUCOSE 99  BUN 13  CREATININE 0.78  CALCIUM 10.1  MG --  PHOS --     Basename 04/20/11 1936  AST 50*  ALT 47*  ALKPHOS 101  BILITOT 0.7  PROT 6.3  ALBUMIN 3.3*     No results found for this basename: LIPASE:2,AMYLASE:2 in the last 72 hours   Basename 04/20/11 1936  WBC 11.5*  NEUTROABS 8.5*  HGB 13.0  HCT 39.2  MCV 93.6  PLT 217     Basename 04/20/11 1936  CKTOTAL --  CKMB --  CKMBINDEX --  TROPONINI <0.30    No results found for this basename: TSH,T4TOTAL,FREET3,T3FREE,THYROIDAB in the last 72 hours  No results found for this basename: VITAMINB12:2,FOLATE:2,FERRITIN:2,TIBC:2,IRON:2,RETICCTPCT:2 in the last 72 hours  Radiological Exams on Admission:  Dg Chest Portable 1 View  04/20/2011  *RADIOLOGY REPORT*  Clinical Data: Shortness of breath, cough  PORTABLE CHEST - 1 VIEW  Comparison: 12/23/2010  Findings: Stable moderate cardiomegaly.  Some increase in central pulmonary vascular congestion and possible mild perihilar and bibasilar interstitial edema or infiltrates.  No definite effusion.  IMPRESSION:  Stable cardiomegaly with some worsening pulmonary vascular congestion and possible mild edema.  Original Report Authenticated By: Osa Craver, M.D.    Assessment/Plan  Present on Admission:  Problems: #1 cough #2 congestion the lungs #3 pedal edema #4 chest discomfort  Impression: #1 CHF decompensation #2 questionable acute on chronic bronchitis #3 obesity #4 history of cardiomyopathy #5 history of current tobacco use #6 hypothyroidism #7 hypertension #8 GERD  Plan: #1 admit to telemetry #2 diurese with IV Lasix #3 oxygen support i.e. O2 via nasal canulla #4 treat for bronchitis with nebulizer and given IV Levaquin #5 restart home meds. #6 GI prophylaxis with Protonix and DVT prophylaxis with Lovenox #7 labs-cardiac enzyme every 8x3, repeat proBNP in a.m., CBC CMP and magnesium repeated in a.m. 2-D echo will also be done Patient will be evaluated on daily basis      Talmage Nap            [phone]

## 2011-04-20 NOTE — ED Provider Notes (Signed)
History     CSN: [id number]  Arrival date & time 04/20/11  1747   First MD Initiated Contact with Patient 04/20/11 1912      Chief Complaint  Patient presents with  . Shortness of Breath    (Consider location/radiation/quality/duration/timing/severity/associated sxs/prior treatment) HPI  Patient relates she started getting short of breath about 3 days ago but got much worse today. She relates she was walking from her apartment to work and got severely short of breath and almost passed out. She states her children have told her her feet have been  swelling. She states she's not having chest pain but her chest feels tight and hard to breathe. She's had a dry cough without fever. She states this feels just like her congestive heart failure she's had in the past.  PCP [facility] Department Cardiologist Dr. Dietrich Pates  Past Medical History  Diagnosis Date  . Hypertension     09/2010-normal CMet and CBC; Lipid profile-116, 88, 25, 73  . Cardiomyopathy 08/2010    Presented with congestive heart failure; EF of 15% and 2012; hypotension on medication precludes optimal dosing  . Gastroesophageal reflux disease   . Hypothyroidism     Recent TSH was normal.  . Pneumonia   . Obesity   . Hyperlipidemia   . Tobacco abuse     20 pack years  . CHF (congestive heart failure)     Past Surgical History  Procedure Date  . Cesarean section     X2    Family History  Problem Relation Age of Onset  . Cardiomyopathy Mother     ICD pacemaker-ischmic CM  . Heart failure Mother   . Hypertension Mother   . Cardiomyopathy Father     Deceased  . Coronary artery disease Father   . Heart failure Father   . Heart failure Brother   . Hypertension Brother     History  Substance Use Topics  . Smoking status: Current Everyday Smoker -- 0.5 packs/day for 20 years    Types: Cigarettes    Last Attempt to Quit: 09/03/2010  . Smokeless tobacco: Not on file  . Alcohol Use: No    employed Lives at home  OB History    Grav Para Term Preterm Abortions TAB SAB Ect Mult Living                  Review of Systems  All other systems reviewed and are negative.    Allergies  Penicillins; Iodinated diagnostic agents; Iodine; and Sulfa antibiotics  Home Medications   Current Outpatient Rx  Name Route Sig Dispense Refill  . ALBUTEROL SULFATE HFA 108 (90 BASE) MCG/ACT IN AERS Inhalation Inhale 2 puffs into the lungs every 6 (six) hours as needed. For shortness of breath    . CARVEDILOL 25 MG PO TABS  Take 2 tablets twice daily 120 tablet 12  . DESLORATADINE 5 MG PO TABS Oral Take 5 mg by mouth daily.    Marland Kitchen FERROUS SULFATE 325 (65 FE) MG PO TABS Oral Take 325 mg by mouth daily with breakfast.    . OMEGA-3 FATTY ACIDS 1000 MG PO CAPS Oral Take 3 g by mouth 3 (three) times daily.      . FUROSEMIDE 80 MG PO TABS Oral Take 1 tablet (80 mg total) by mouth 2 (two) times daily. Call for increased weight 60 tablet 3  . IBUPROFEN 200 MG PO TABS Oral Take 400 mg by mouth as needed. For pain    .  LEVOTHYROXINE SODIUM 300 MCG PO TABS Oral Take 300 mcg by mouth daily.      Marland Kitchen POTASSIUM CHLORIDE CRYS ER 20 MEQ PO TBCR Oral Take 2 tablets (40 mEq total) by mouth 2 (two) times daily. 120 tablet 3  . QUINAPRIL HCL 20 MG PO TABS Oral Take 1 tablet (20 mg total) by mouth at bedtime. 30 tablet 12  . RANITIDINE HCL 150 MG PO TABS Oral Take 150 mg by mouth 2 (two) times daily as needed. For gastric reflux    . BEANO PO Oral Take by mouth daily.        BP 119/75  Pulse 82  Temp(Src) 98 F (36.7 C) (Oral)  Resp 26  Ht 5' 7.5" (1.715 m)  Wt 271 lb (122.925 kg)  BMI 41.82 kg/m2  SpO2 96%  Vital signs normal    Physical Exam  Nursing note and vitals reviewed. Constitutional: She is oriented to person, place, and time. She appears well-developed and well-nourished.  Non-toxic appearance. She does not appear ill. No distress.  HENT:  Head: Normocephalic and atraumatic.  Right Ear:  External ear normal.  Left Ear: External ear normal.  Nose: Nose normal. No mucosal edema or rhinorrhea.  Mouth/Throat: Oropharynx is clear and moist and mucous membranes are normal. No dental abscesses or uvula swelling.  Eyes: Conjunctivae and EOM are normal. Pupils are equal, round, and reactive to light.  Neck: Normal range of motion and full passive range of motion without pain. Neck supple.  Cardiovascular: Normal rate, regular rhythm and normal heart sounds.  Exam reveals no gallop and no friction rub.   No murmur heard. Pulmonary/Chest: She is in respiratory distress. She has wheezes. She has no rhonchi. She has rales. She exhibits no tenderness and no crepitus.       Patient has diminished breath sounds diffusely with some rales at the bases and rare end expiratory wheeze  Abdominal: Soft. Normal appearance and bowel sounds are normal. She exhibits no distension. There is no tenderness. There is no rebound and no guarding.  Musculoskeletal: Normal range of motion. She exhibits edema. She exhibits no tenderness.       Moves all extremities well.   Neurological: She is alert and oriented to person, place, and time. She has normal strength. No cranial nerve deficit.  Skin: Skin is warm, dry and intact. No rash noted. No erythema. No pallor.  Psychiatric: She has a normal mood and affect. Her speech is normal and behavior is normal. Her mood appears not anxious.    ED Course  Procedures (including critical care time)  .  Medications  furosemide (LASIX) injection 80 mg (80 mg Intravenous Given 04/20/11 1953)  nitroGLYCERIN (NITROGLYN) 2 % ointment 1 inch (1 inch Topical Given 04/20/11 1953)  albuterol (PROVENTIL) (5 MG/ML) 0.5% nebulizer solution 2.5 mg (2.5 mg Nebulization Given 04/20/11 2148)  ipratropium (ATROVENT) nebulizer solution 0.5 mg (0.5 mg Nebulization Given 04/20/11 2148)   Patient has had 2000 cc urine output. She continued to have some wheezing and received an albuterol  nebulizer treatment.   22:05 Dr Beverly Gust admit to triad team 2 tele   Results for orders placed during the hospital encounter of 04/20/11  CBC      Component Value Range   WBC 11.5 (*) 4.0 - 10.5 (K/uL)   RBC 4.19  3.87 - 5.11 (MIL/uL)   Hemoglobin 13.0  12.0 - 15.0 (g/dL)   HCT 40.9  81.1 - 91.4 (%)   MCV 93.6  78.0 -  100.0 (fL)   MCH 31.0  26.0 - 34.0 (pg)   MCHC 33.2  30.0 - 36.0 (g/dL)   RDW 16.1  09.6 - 04.5 (%)   Platelets 217  150 - 400 (K/uL)  DIFFERENTIAL      Component Value Range   Neutrophils Relative 74  43 - 77 (%)   Neutro Abs 8.5 (*) 1.7 - 7.7 (K/uL)   Lymphocytes Relative 18  12 - 46 (%)   Lymphs Abs 2.1  0.7 - 4.0 (K/uL)   Monocytes Relative 5  3 - 12 (%)   Monocytes Absolute 0.6  0.1 - 1.0 (K/uL)   Eosinophils Relative 3  0 - 5 (%)   Eosinophils Absolute 0.3  0.0 - 0.7 (K/uL)   Basophils Relative 0  0 - 1 (%)   Basophils Absolute 0.1  0.0 - 0.1 (K/uL)  COMPREHENSIVE METABOLIC PANEL      Component Value Range   Sodium 140  135 - 145 (mEq/L)   Potassium 4.8  3.5 - 5.1 (mEq/L)   Chloride 106  96 - 112 (mEq/L)   CO2 28  19 - 32 (mEq/L)   Glucose, Bld 99  70 - 99 (mg/dL)   BUN 13  6 - 23 (mg/dL)   Creatinine, Ser 4.09  0.50 - 1.10 (mg/dL)   Calcium 81.1  8.4 - 10.5 (mg/dL)   Total Protein 6.3  6.0 - 8.3 (g/dL)   Albumin 3.3 (*) 3.5 - 5.2 (g/dL)   AST 50 (*) 0 - 37 (U/L)   ALT 47 (*) 0 - 35 (U/L)   Alkaline Phosphatase 101  39 - 117 (U/L)   Total Bilirubin 0.7  0.3 - 1.2 (mg/dL)   GFR calc non Af Amer >90  >90 (mL/min)   GFR calc Af Amer >90  >90 (mL/min)  PRO B NATRIURETIC PEPTIDE      Component Value Range   Pro B Natriuretic peptide (BNP) 1563.0 (*) 0 - 125 (pg/mL)  URINALYSIS, ROUTINE W REFLEX MICROSCOPIC      Component Value Range   Color, Urine YELLOW  YELLOW    APPearance CLEAR  CLEAR    Specific Gravity, Urine 1.020  1.005 - 1.030    pH 6.0  5.0 - 8.0    Glucose, UA NEGATIVE  NEGATIVE (mg/dL)   Hgb urine dipstick NEGATIVE  NEGATIVE     Bilirubin Urine NEGATIVE  NEGATIVE    Ketones, ur NEGATIVE  NEGATIVE (mg/dL)   Protein, ur NEGATIVE  NEGATIVE (mg/dL)   Urobilinogen, UA 0.2  0.0 - 1.0 (mg/dL)   Nitrite NEGATIVE  NEGATIVE    Leukocytes, UA NEGATIVE  NEGATIVE   TROPONIN I      Component Value Range   Troponin I <0.30  <0.30 (ng/mL)   Laboratory interpretation all normal except leukocytosis and elevated BNP which is similar to prior elevations in her BMP with her congestive heart failure    Dg Chest Portable 1 View  04/20/2011  *RADIOLOGY REPORT*  Clinical Data: Shortness of breath, cough  PORTABLE CHEST - 1 VIEW  Comparison: 12/23/2010  Findings: Stable moderate cardiomegaly.  Some increase in central pulmonary vascular congestion and possible mild perihilar and bibasilar interstitial edema or infiltrates.  No definite effusion.  IMPRESSION:  Stable cardiomegaly with some worsening pulmonary vascular congestion and possible mild edema.  Original Report Authenticated By: Osa Craver, M.D.    Date: 04/20/2011  Rate: 82  Rhythm: normal sinus rhythm  QRS Axis: right  Intervals: QT prolonged  ST/T Wave abnormalities: nonspecific ST/T changes  Conduction Disutrbances:none  Narrative Interpretation:   Old EKG Reviewed: unchanged from 12/18/2010     1. Congestive heart failure (CHF)     Plan admit  CRITICAL CARE Performed by: Devoria Albe L   Total critical care time:31 minutes  Critical care time was exclusive of separately billable procedures and treating other patients.  Critical care was necessary to treat or prevent imminent or life-threatening deterioration.  Critical care was time spent personally by me on the following activities: development of treatment plan with patient and/or surrogate as well as nursing, discussions with consultants, evaluation of patient's response to treatment, examination of patient, obtaining history from patient or surrogate, ordering and performing treatments and  interventions, ordering and review of laboratory studies, ordering and review of radiographic studies, pulse oximetry and re-evaluation of patient's condition.   MDM          Ward Givens, MD 04/20/11 2224

## 2011-04-20 NOTE — Telephone Encounter (Signed)
Please fax to [facility] [phone]

## 2011-04-20 NOTE — ED Notes (Signed)
Spoke with Dr. Beverly Gust.  He stated he would be coming down to department to see pt and did not want the bed requested or pt taken till floor till he sees her in ED.

## 2011-04-21 ENCOUNTER — Encounter (HOSPITAL_COMMUNITY): Payer: Self-pay | Admitting: Unknown Physician Specialty

## 2011-04-21 DIAGNOSIS — E876 Hypokalemia: Secondary | ICD-10-CM | POA: Diagnosis present

## 2011-04-21 DIAGNOSIS — J40 Bronchitis, not specified as acute or chronic: Secondary | ICD-10-CM | POA: Diagnosis present

## 2011-04-21 DIAGNOSIS — I369 Nonrheumatic tricuspid valve disorder, unspecified: Secondary | ICD-10-CM

## 2011-04-21 DIAGNOSIS — I5043 Acute on chronic combined systolic (congestive) and diastolic (congestive) heart failure: Secondary | ICD-10-CM | POA: Diagnosis present

## 2011-04-21 LAB — CARDIAC PANEL(CRET KIN+CKTOT+MB+TROPI)
CK, MB: 1.5 ng/mL (ref 0.3–4.0)
CK, MB: 1.2 ng/mL (ref 0.3–4.0)
Troponin I: <0.3 ng/mL (ref <0.30)
Troponin I: <0.3 ng/mL (ref <0.30)

## 2011-04-21 LAB — COMPREHENSIVE METABOLIC PANEL
BUN: 11 mg/dL (ref 6–23)
CO2: 27 mEq/L (ref 19–32)
Calcium: 9.4 mg/dL (ref 8.4–10.5)
Creatinine, Ser: 0.87 mg/dL (ref 0.50–1.10)
GFR calc Af Amer: 89 mL/min — ABNORMAL LOW (ref >90)
GFR calc non Af Amer: 77 mL/min — ABNORMAL LOW (ref >90)
Glucose, Bld: 141 mg/dL — ABNORMAL HIGH (ref 70–99)

## 2011-04-21 LAB — DIFFERENTIAL
Basophils Absolute: 0.1 10*3/uL (ref 0.0–0.1)
Eosinophils Relative: 3 % (ref 0–5)
Lymphocytes Relative: 21 % (ref 12–46)
Lymphs Abs: 1.9 10*3/uL (ref 0.7–4.0)
Monocytes Absolute: 0.7 10*3/uL (ref 0.1–1.0)
Monocytes Relative: 8 % (ref 3–12)

## 2011-04-21 LAB — CBC
HCT: 38.7 % (ref 36.0–46.0)
MCV: 93.3 fL (ref 78.0–100.0)
RDW: 14.8 % (ref 11.5–15.5)
WBC: 9.3 10*3/uL (ref 4.0–10.5)

## 2011-04-21 MED ORDER — LORATADINE 10 MG PO TABS
10.0000 mg | ORAL_TABLET | Freq: Every day | ORAL | Status: DC
  Administered 2011-04-21 – 2011-04-22 (×2): 10 mg via ORAL
  Filled 2011-04-21 (×2): qty 1

## 2011-04-21 MED ORDER — IPRATROPIUM BROMIDE 0.02 % IN SOLN
0.5000 mg | Freq: Four times a day (QID) | RESPIRATORY_TRACT | Status: DC
  Administered 2011-04-21 – 2011-04-22 (×4): 0.5 mg via RESPIRATORY_TRACT
  Filled 2011-04-21 (×4): qty 2.5

## 2011-04-21 MED ORDER — FUROSEMIDE 10 MG/ML IJ SOLN
80.0000 mg | Freq: Four times a day (QID) | INTRAMUSCULAR | Status: DC
  Administered 2011-04-21 (×2): 80 mg via INTRAVENOUS
  Filled 2011-04-21 (×2): qty 8

## 2011-04-21 MED ORDER — NITROGLYCERIN 0.4 MG SL SUBL: 0.4000 mg | SUBLINGUAL_TABLET | SUBLINGUAL | Status: DC | PRN

## 2011-04-21 MED ORDER — IBUPROFEN 800 MG PO TABS: 400.0000 mg | ORAL_TABLET | Freq: Three times a day (TID) | ORAL | Status: DC | PRN

## 2011-04-21 MED ORDER — ENOXAPARIN SODIUM 40 MG/0.4ML ~~LOC~~ SOLN
40.0000 mg | Freq: Every day | SUBCUTANEOUS | Status: DC
  Administered 2011-04-21: 40 mg via SUBCUTANEOUS
  Filled 2011-04-21: qty 0.4

## 2011-04-21 MED ORDER — SODIUM CHLORIDE 0.9 % IJ SOLN
INTRAMUSCULAR | Status: AC
  Filled 2011-04-21: qty 3

## 2011-04-21 MED ORDER — CARVEDILOL 12.5 MG PO TABS
25.0000 mg | ORAL_TABLET | Freq: Two times a day (BID) | ORAL | Status: DC
  Administered 2011-04-21 – 2011-04-22 (×3): 25 mg via ORAL
  Filled 2011-04-21: qty 1
  Filled 2011-04-21: qty 2
  Filled 2011-04-21: qty 1
  Filled 2011-04-21: qty 2

## 2011-04-21 MED ORDER — SODIUM CHLORIDE 0.9 % IJ SOLN
INTRAMUSCULAR | Status: AC
  Administered 2011-04-21: 3 mL
  Filled 2011-04-21: qty 3

## 2011-04-21 MED ORDER — ALBUTEROL SULFATE (5 MG/ML) 0.5% IN NEBU
2.5000 mg | INHALATION_SOLUTION | Freq: Four times a day (QID) | RESPIRATORY_TRACT | Status: DC
  Administered 2011-04-21 – 2011-04-22 (×4): 2.5 mg via RESPIRATORY_TRACT
  Filled 2011-04-21 (×4): qty 0.5

## 2011-04-21 MED ORDER — FUROSEMIDE 10 MG/ML IJ SOLN
80.0000 mg | Freq: Two times a day (BID) | INTRAMUSCULAR | Status: DC
  Administered 2011-04-21: 80 mg via INTRAVENOUS
  Filled 2011-04-21: qty 8

## 2011-04-21 MED ORDER — FAMOTIDINE 20 MG PO TABS
10.0000 mg | ORAL_TABLET | Freq: Every day | ORAL | Status: DC
  Administered 2011-04-21 – 2011-04-22 (×2): 10 mg via ORAL
  Filled 2011-04-21 (×2): qty 1

## 2011-04-21 MED ORDER — POTASSIUM CHLORIDE CRYS ER 20 MEQ PO TBCR
40.0000 meq | EXTENDED_RELEASE_TABLET | Freq: Two times a day (BID) | ORAL | Status: DC
  Administered 2011-04-21 – 2011-04-22 (×3): 40 meq via ORAL
  Filled 2011-04-21 (×3): qty 2

## 2011-04-21 MED ORDER — ALBUTEROL SULFATE (5 MG/ML) 0.5% IN NEBU: 5.0000 mg | INHALATION_SOLUTION | RESPIRATORY_TRACT | Status: DC

## 2011-04-21 MED ORDER — OMEGA-3-ACID ETHYL ESTERS 1 G PO CAPS
3.0000 | ORAL_CAPSULE | Freq: Three times a day (TID) | ORAL | Status: DC
  Administered 2011-04-21 – 2011-04-22 (×4): 3 g via ORAL
  Filled 2011-04-21 (×4): qty 3

## 2011-04-21 MED ORDER — LEVOFLOXACIN IN D5W 500 MG/100ML IV SOLN
500.0000 mg | INTRAVENOUS | Status: DC
  Administered 2011-04-21 – 2011-04-22 (×2): 500 mg via INTRAVENOUS
  Filled 2011-04-21 (×5): qty 100

## 2011-04-21 MED ORDER — IBUPROFEN 800 MG PO TABS
400.0000 mg | ORAL_TABLET | Freq: Three times a day (TID) | ORAL | Status: DC
  Administered 2011-04-21 (×2): 400 mg via ORAL
  Filled 2011-04-21 (×2): qty 1

## 2011-04-21 MED ORDER — LISINOPRIL 10 MG PO TABS
20.0000 mg | ORAL_TABLET | Freq: Every day | ORAL | Status: DC
  Administered 2011-04-21: 20 mg via ORAL
  Filled 2011-04-21: qty 2

## 2011-04-21 MED ORDER — LEVOTHYROXINE SODIUM 100 MCG PO TABS
300.0000 ug | ORAL_TABLET | Freq: Every day | ORAL | Status: DC
  Administered 2011-04-21 – 2011-04-22 (×2): 300 ug via ORAL
  Filled 2011-04-21 (×2): qty 3

## 2011-04-21 MED ORDER — LEVOFLOXACIN IN D5W 500 MG/100ML IV SOLN
INTRAVENOUS | Status: AC
  Filled 2011-04-21: qty 100

## 2011-04-21 MED ORDER — FERROUS SULFATE 325 (65 FE) MG PO TABS
325.0000 mg | ORAL_TABLET | Freq: Every day | ORAL | Status: DC
  Administered 2011-04-21 – 2011-04-22 (×2): 325 mg via ORAL
  Filled 2011-04-21 (×2): qty 1

## 2011-04-21 NOTE — Progress Notes (Signed)
PATIENT DETAILS Name: Melanie Cordova Age: 51 y.o. Sex: female Date of Birth: 02-22-1961 Admit Date: 04/20/2011 ZOX:WRUEA,VWUJW, NP, NP Emergency contact:   CONSULTS: None  Interval History: Mrs.  Cordova is a 51 year old female who was admitted on 04/20/11 with decompensated CHF and acute on chronic bronchitis.  ROS: Melanie Cordova says she is feeling better.  She is less dyspneic.  Her cough is non-productive.  She states that her lower extremity edema has also improved.   Objective: Vital signs in last 24 hours: Temp:  [98 F (36.7 C)-98.6 F (37 C)] 98 F (36.7 C) (02/07 0534) Pulse Rate:  [76-85] 81  (02/07 0534) Resp:  [16-26] 20  (02/07 0534) BP: (114-123)/(71-82) 114/73 mmHg (02/07 0534) SpO2:  [94 %-99 %] 94 % (02/07 1443) FiO2 (%):  [21 %] 21 % (02/07 1443) Weight:  [122.925 kg (271 lb)-127 kg (279 lb 15.8 oz)] 127 kg (279 lb 15.8 oz) (02/07 0237) Weight change:  Last BM Date: 04/21/11  Intake/Output from previous day:  Intake/Output Summary (Last 24 hours) at 04/21/11 1451 Last data filed at 04/21/11 1431  Gross per 24 hour  Intake    348 ml  Output   6900 ml  Net  -6552 ml     Physical Exam:  Gen:  NAD Cardiovascular:  RRR, No M/R/G Respiratory: Lungs with course expiratory wheezes and bibasilar crackles. Gastrointestinal: Abdomen soft, NT/ND with normal active bowel sounds. Extremities: Trace edema     Lab Results: Basic Metabolic Panel:  Lab 04/21/11 1191 04/20/11 1936  NA 139 140  K 3.4* 4.8  CL 101 106  CO2 27 28  GLUCOSE 141* 99  BUN 11 13  CREATININE 0.87 0.78  CALCIUM 9.4 10.1  MG 1.9 --  PHOS -- --   GFR Estimated Creatinine Clearance: 109.3 ml/min (by C-G formula based on Cr of 0.87). Liver Function Tests:  Lab 04/21/11 0403 04/20/11 1936  Cordova 32 50*  ALT 43* 47*  ALKPHOS 101 101  BILITOT 0.6 0.7  PROT 6.5 6.3  ALBUMIN 3.3* 3.3*    CBC:  Lab 04/21/11 0403 04/20/11 1936  WBC 9.3 11.5*  NEUTROABS 6.3 8.5*  HGB  12.8 13.0  HCT 38.7 39.2  MCV 93.3 93.6  PLT 223 217   Cardiac Enzymes:  Lab 04/21/11 1103 04/21/11 0403 04/20/11 1936  CKTOTAL 35 41 --  CKMB 1.5 1.5 --  CKMBINDEX -- -- --  TROPONINI <0.30 <0.30 <0.30    Ref. Range 12/18/2010 23:11 04/20/2011 19:36 04/20/2011 19:37 04/21/2011 04:03 04/21/2011 11:03  Pro B Natriuretic peptide (BNP) Latest Range: 0-125 pg/mL   1563.0 (H) 1464.0 (H)     Studies/Results: Dg Chest Portable 1 View  04/20/2011  *RADIOLOGY REPORT*  Clinical Data: Shortness of breath, cough  PORTABLE CHEST - 1 VIEW  Comparison: 12/23/2010  Findings: Stable moderate cardiomegaly.  Some increase in central pulmonary vascular congestion and possible mild perihilar and bibasilar interstitial edema or infiltrates.  No definite effusion.  IMPRESSION:  Stable cardiomegaly with some worsening pulmonary vascular congestion and possible mild edema.  Original Report Authenticated By: Osa Craver, M.D.    Medications: Scheduled Meds:    . albuterol  2.5 mg Nebulization Once  . albuterol  2.5 mg Nebulization Q6H  . carvedilol  25 mg Oral BID WC  . enoxaparin  40 mg Subcutaneous Daily  . famotidine  10 mg Oral Daily  . ferrous sulfate  325 mg Oral Q breakfast  . furosemide  80 mg  Intravenous Once  . furosemide  80 mg Intravenous Q6H  . ibuprofen  400 mg Oral TID  . ipratropium  0.5 mg Nebulization Once  . ipratropium  0.5 mg Nebulization Q6H  . levofloxacin (LEVAQUIN) IV  500 mg Intravenous Q24H  . levothyroxine  300 mcg Oral Daily  . lisinopril  20 mg Oral QHS  . loratadine  10 mg Oral Daily  . nitroGLYCERIN  1 inch Topical Once  . omega-3 acid ethyl esters  3 capsule Oral TID  . potassium chloride SA  40 mEq Oral BID  . sodium chloride      . DISCONTD: albuterol  5 mg Nebulization Q4H   Continuous Infusions:  PRN Meds:.nitroGLYCERIN Antibiotics: Anti-infectives     Start     Dose/Rate Route Frequency Ordered Stop   04/21/11 0330   levofloxacin (LEVAQUIN) IVPB 500 mg         500 mg 100 mL/hr over 60 Minutes Intravenous Every 24 hours 04/21/11 0305             Assessment/Plan:  Principal Problem:  *Systolic and diastolic CHF, acute on chronic /  Cardiomyopathy Diuresing well with 80 mg lasix IV Q 6 hours (home dose is 80 mg PO daily).  She has diuresed over 6 liters since admission.  Will decrease lasix to 80 mg IV Q 12 hours to avoid over-diuresis.  F/U repeat 2 D Echo, although it is not likely to show much change as she has a known low EF (20 % along with diffuse hypokinesis and grade I diastolic dysfunction).  Continue beta blocker and ACE-I at current doses.  Cardiac markers negative.  Pro-BNP elevated but may be slow to respond, so would not monitor daily. Active Problems:  Morbid obesity Will ask dietician to see and educate regarding calorie restricted and low salt diet.  Hypertension Controlled on lasix, lisinopril and Coreg.  Gastroesophageal reflux disease Continue Pepcid.  Hypothyroidism Continue Synthroid.  Tobacco abuse Will ask the nurses to provide tobacco cessation information.  Hyperlipidemia Continue heart healthy diet and Lovaza.   Hypokalemia On supplementation therapy.  May need to reduce given the reduction in her lasix dose, but potassium is still low, so won't adjust yet.  Bronchitis On empiric Levaquin, nebulized bronchodilator therapy and anti-histamines.      LOS: 1 day   Hillery Aldo, MD Pager [phone]  04/21/2011, 2:51 PM

## 2011-04-21 NOTE — Progress Notes (Signed)
*  PRELIMINARY RESULTS* Echocardiogram 2D Echocardiogram has been performed.  Melanie Cordova 04/21/2011, 10:30 AM

## 2011-04-22 LAB — URINE CULTURE

## 2011-04-22 LAB — CBC
HCT: 44.8 % (ref 36.0–46.0)
Hemoglobin: 15.1 g/dL — ABNORMAL HIGH (ref 12.0–15.0)
MCV: 92.2 fL (ref 78.0–100.0)
RBC: 4.86 MIL/uL (ref 3.87–5.11)
RDW: 14.8 % (ref 11.5–15.5)
WBC: 9.8 10*3/uL (ref 4.0–10.5)

## 2011-04-22 LAB — BASIC METABOLIC PANEL
CO2: 25 mEq/L (ref 19–32)
Chloride: 100 mEq/L (ref 96–112)
Creatinine, Ser: 0.68 mg/dL (ref 0.50–1.10)
GFR calc Af Amer: >90 mL/min (ref >90)
Potassium: 4.2 mEq/L (ref 3.5–5.1)

## 2011-04-22 MED ORDER — QUINAPRIL HCL 20 MG PO TABS: 20.0000 mg | ORAL_TABLET | Freq: Every day | ORAL | Status: DC

## 2011-04-22 MED ORDER — SODIUM CHLORIDE 0.9 % IJ SOLN
INTRAMUSCULAR | Status: AC
  Administered 2011-04-22: 10 mL
  Filled 2011-04-22: qty 3

## 2011-04-22 MED ORDER — SODIUM CHLORIDE 0.9 % IJ SOLN
INTRAMUSCULAR | Status: AC
  Administered 2011-04-22: 3 mL
  Filled 2011-04-22: qty 3

## 2011-04-22 MED ORDER — SODIUM CHLORIDE 0.9 % IJ SOLN
INTRAMUSCULAR | Status: AC
  Administered 2011-04-22: 08:00:00
  Filled 2011-04-22: qty 3

## 2011-04-22 MED ORDER — CIPROFLOXACIN HCL 250 MG PO TABS: 250.0000 mg | ORAL_TABLET | Freq: Two times a day (BID) | ORAL | Status: AC

## 2011-04-22 MED ORDER — ENOXAPARIN SODIUM 80 MG/0.8ML ~~LOC~~ SOLN
0.5000 mg/kg | Freq: Every day | SUBCUTANEOUS | Status: DC
  Administered 2011-04-22: 65 mg via SUBCUTANEOUS
  Filled 2011-04-22: qty 0.8

## 2011-04-22 MED ORDER — FUROSEMIDE 80 MG PO TABS: 80.0000 mg | ORAL_TABLET | Freq: Two times a day (BID) | ORAL | Status: DC

## 2011-04-22 NOTE — Progress Notes (Signed)
CARE MANAGEMENT NOTE 04/22/2011  Patient:  Melanie Cordova, Melanie Cordova   Account Number:  [id number]  Date Initiated:  04/22/2011  Documentation initiated by:  Rosemary Holms  Subjective/Objective Assessment:   Pt admitted with CHF, acute on chronic bronchitis. Lives at home, independently PTA. States she has problems with transportation and affording medications. PCP's office does work with her with [facility] but refills not timely     Action/Plan:   Pt discharged home. No HH needs identified. Assisted pateint with new prescriptions through the [employer] grant fund and [address].   Anticipated DC Date:  04/22/2011   Anticipated DC Plan:  HOME/SELF CARE  In-house referral  Financial Counselor      DC Planning Services  CM consult      Choice offered to / List presented to:             Status of service:  Completed, signed off Medicare Important Message given?   (If response is "NO", the following Medicare IM given date fields will be blank) Date Medicare IM given:   Date Additional Medicare IM given:    Discharge Disposition:  HOME/SELF CARE  Per UR Regulation:    Comments:  04/22/11 1000 Layken Beg Leanord Hawking RN BSN CM

## 2011-04-22 NOTE — Discharge Summary (Signed)
Physician Discharge Summary  Patient ID: Melanie Cordova MRN: [id number] DOB/AGE: February 03, 1961 51 y.o. Primary Care Physician:ALLEN,DEBRA, NP, NP Admit date: 04/20/2011 Discharge date: 04/22/2011    Discharge Diagnoses:  1. Acute systolic/diastolic congestive heart failure. 2. UTI with gram-negative rods. 3. Nonischemic cardiomyopathy, ejection fraction 20%. 4. Hypothyroidism. 5. Tobacco abuse. 6. Obesity.   Medication List  As of 04/22/2011 10:14 AM   STOP taking these medications         ibuprofen 200 MG tablet         TAKE these medications         BEANO PO   Take by mouth daily.      carvedilol 25 MG tablet   Commonly known as: COREG   Take 2 tablets twice daily      ciprofloxacin 250 MG tablet   Commonly known as: CIPRO   Take 1 tablet (250 mg total) by mouth 2 (two) times daily.      desloratadine 5 MG tablet   Commonly known as: CLARINEX   Take 5 mg by mouth daily.      fish oil-omega-3 fatty acids 1000 MG capsule   Take 3 g by mouth 3 (three) times daily.      furosemide 80 MG tablet   Commonly known as: LASIX   Take 1 tablet (80 mg total) by mouth 2 (two) times daily. Call for increased weight      IRON SUPPLEMENT 325 (65 FE) MG tablet   Generic drug: ferrous sulfate   Take 325 mg by mouth daily with breakfast.      levothyroxine 300 MCG tablet   Commonly known as: SYNTHROID, LEVOTHROID   Take 300 mcg by mouth daily.      potassium chloride SA 20 MEQ tablet   Commonly known as: K-DUR,KLOR-CON   Take 2 tablets (40 mEq total) by mouth 2 (two) times daily.      PROVENTIL HFA 108 (90 BASE) MCG/ACT inhaler   Generic drug: albuterol   Inhale 2 puffs into the lungs every 6 (six) hours as needed. For shortness of breath      quinapril 20 MG tablet   Commonly known as: ACCUPRIL   Take 1 tablet (20 mg total) by mouth at bedtime.      ranitidine 150 MG tablet   Commonly known as: ZANTAC   Take 150 mg by mouth 2 (two) times daily as needed. For gastric  reflux            Discharged Condition: Improved and stable.    Consults: None.  Significant Diagnostic Studies: Dg Chest Portable 1 View  04/20/2011  *RADIOLOGY REPORT*  Clinical Data: Shortness of breath, cough  PORTABLE CHEST - 1 VIEW  Comparison: 12/23/2010  Findings: Stable moderate cardiomegaly.  Some increase in central pulmonary vascular congestion and possible mild perihilar and bibasilar interstitial edema or infiltrates.  No definite effusion.  IMPRESSION:  Stable cardiomegaly with some worsening pulmonary vascular congestion and possible mild edema.  Original Report Authenticated By: Osa Craver, M.D.    Lab Results: Basic Metabolic Panel:  Basename 04/22/11 0534 04/21/11 0403  NA 138 139  K 4.2 3.4*  CL 100 101  CO2 25 27  GLUCOSE 130* 141*  BUN 12 11  CREATININE 0.68 0.87  CALCIUM 9.7 9.4  MG -- 1.9  PHOS -- --   Liver Function Tests:  Basename 04/21/11 0403 04/20/11 1936  AST 32 50*  ALT 43* 47*  ALKPHOS 101 101  BILITOT  0.6 0.7  PROT 6.5 6.3  ALBUMIN 3.3* 3.3*     CBC:  Basename 04/22/11 0534 04/21/11 0403 04/20/11 1936  WBC 9.8 9.3 --  NEUTROABS -- 6.3 8.5*  HGB 15.1* 12.8 --  HCT 44.8 38.7 --  MCV 92.2 93.3 --  PLT 253 223 --    Recent Results (from the past 240 hour(s))  URINE CULTURE     Status: Normal (Preliminary result)   Collection Time   04/20/11  7:53 PM      Component Value Range Status Comment   Specimen Description URINE, CATHETERIZED   Final    Special Requests NONE   Final    Culture  Setup Time [id number]   Final    Colony Count >=100,000 COLONIES/ML   Final    Culture GRAM NEGATIVE RODS   Final    Report Status PENDING   Incomplete      Hospital Course: This 51 year old lady was admitted with symptoms of dyspnea. She had run out of her Lasix for 3 days prior to hospitalization. Unfortunately, she has financial constraints which make her for ability of her medications rather difficult. She does not have  any health insurance. She is apparently not qualifying for Medicaid or Medicare with disability despite her very low ejection fraction. She was admitted to the hospital and given intravenous Lasix. She has done well and feels back to her normal state. Urinalysis and culture confirms the presence of a UTI gram-negative rods, full identification is awaited.  Discharge Exam: Blood pressure 104/70, pulse 73, temperature 97.6 F (36.4 C), temperature source Oral, resp. rate 22, height 5' 7.5" (1.715 m), weight 127 kg (279 lb 15.8 oz), SpO2 97.00%. she looks systemically well. There is no increased work of breathing. There is no peripheral central cyanosis. Heart sounds are present and normal without gallop rhythm. Jugular venous pressure not raised. Lung fields are entirely clear with no real evidence of congestive heart failure clinically. Abdomen is soft and nontender. She is alert and orientated without any focal neurological signs.  Disposition:  home. We will see if we can help her out with medications. We will also see if we can investigate further her application for Medicaid/disability. She will follow with her primary care physician.  Discharge Orders    Future Orders Please Complete By Expires   Diet - low sodium heart healthy      Increase activity slowly         Follow-up Information    Follow up with [name], NP. Schedule an appointment as soon as possible for a visit in 1 week.         SignedWilson Singer Pager [phone]  04/22/2011, 10:14 AM

## 2011-04-22 NOTE — Plan of Care (Signed)
Problem: Food- and Nutrition-Related Knowledge Deficit (NB-1.1) Goal: Nutrition education Formal process to instruct or train a patient/client in a skill or to impart knowledge to help patients/clients voluntarily manage or modify food choices and eating behavior to maintain or improve health.  Outcome: Completed/Met Date Met:  04/22/11 Pt provided overview of Low Sodium Diet guidelines and tips for weight loss. Topics included eating healthy on a budget, re-emphasized carb containing foods, and avoiding processed foods. Handouts provided from [employer]. Pt verbalized understanding. Questions answered. Expect fair compliance.  Karenann Cai Pager #: [phone]  Iven Finn, MS, RD, LDN Pager [phone]

## 2011-04-22 NOTE — Progress Notes (Signed)
[facility] SURGICAL UNIT [address] [place] [address]  April 22, 2011  Patient: Melanie Cordova  Date of Birth: July 01, 1960  Date of Visit: 04/20/2011    To Whom It May Concern:  [place] was seen and treated in our hospital on 04/20/2011 to 04/22/2011. [name]  can return to work on February 11th 2013.  Sincerely,

## 2011-04-22 NOTE — Progress Notes (Signed)
Patient discharged this am with instructions given on medications,and follow up visits,patient verbalized understanding.No C/O pain or discomfort noted.Prescriptions sent with patient.Accompanied by staff to an awaiting vehicle.

## 2011-04-25 ENCOUNTER — Other Ambulatory Visit: Payer: Self-pay | Admitting: Cardiology

## 2011-04-26 ENCOUNTER — Encounter: Payer: Self-pay | Admitting: *Deleted

## 2011-04-26 LAB — BASIC METABOLIC PANEL
BUN: 21 mg/dL (ref 6–23)
CO2: 24 mEq/L (ref 19–32)
Chloride: 100 mEq/L (ref 96–112)
Creat: 0.98 mg/dL (ref 0.50–1.10)
Glucose, Bld: 80 mg/dL (ref 70–99)
Potassium: 4.4 mEq/L (ref 3.5–5.3)

## 2011-04-27 ENCOUNTER — Ambulatory Visit (INDEPENDENT_AMBULATORY_CARE_PROVIDER_SITE_OTHER): Payer: Self-pay | Admitting: Physician Assistant

## 2011-04-27 ENCOUNTER — Encounter: Payer: Self-pay | Admitting: Physician Assistant

## 2011-04-27 DIAGNOSIS — I428 Other cardiomyopathies: Secondary | ICD-10-CM

## 2011-04-27 DIAGNOSIS — F17200 Nicotine dependence, unspecified, uncomplicated: Secondary | ICD-10-CM

## 2011-04-27 DIAGNOSIS — Z720 Tobacco use: Secondary | ICD-10-CM

## 2011-04-27 DIAGNOSIS — I10 Essential (primary) hypertension: Secondary | ICD-10-CM

## 2011-04-27 NOTE — Progress Notes (Signed)
HPI:  This is a 51 year old white female patient who has a history of a cardiomyopathy g unknown. She was recently admitted to the hospital with acute systolic and diastolic heart failure and 04/20/11. She was not seen by cardiology. She diuresed and was instructed on proper medication dosing. She was also treated with Cipro for UTI.2-D echo done in the hospital showed an ejection fraction of 20% and Dr. Dietrich Pates read as modest improvement in LV function. She also had grade 1 diastolic dysfunction. See below for full echo results.  The patient comes in today feeling much better. She lost 6 pounds. She is watching her sodium intake and is trying to cut back on cigarette smoking. She had blood work last week that was stable.  She denies chest pain, palpitations, dyspnea, dyspnea on exertion, edema, dizziness, or presyncope.   Allergies  Allergen Reactions  . Penicillins Anaphylaxis and Shortness Of Breath    Hair loss  . Iodinated Diagnostic Agents Hives and Other (See Comments)    Pulmonary problems; no frank respiratory arrest  . Iodine   . Sulfa Antibiotics Other (See Comments)    Pt can't remember reaction    Current Outpatient Prescriptions on File Prior to Visit  Medication Sig Dispense Refill  . albuterol (PROVENTIL HFA) 108 (90 BASE) MCG/ACT inhaler Inhale 2 puffs into the lungs every 6 (six) hours as needed. For shortness of breath      . Alpha-D-Galactosidase (BEANO PO) Take by mouth daily.        . carvedilol (COREG) 25 MG tablet Take 2 tablets twice daily  120 tablet  12  . ciprofloxacin (CIPRO) 250 MG tablet Take 1 tablet (250 mg total) by mouth 2 (two) times daily.  10 tablet  0  . desloratadine (CLARINEX) 5 MG tablet Take 5 mg by mouth daily.      . ferrous sulfate (IRON SUPPLEMENT) 325 (65 FE) MG tablet Take 325 mg by mouth daily with breakfast.      . fish oil-omega-3 fatty acids 1000 MG capsule Take 3 g by mouth 3 (three) times daily.        . furosemide (LASIX) 80 MG tablet  Take 1 tablet (80 mg total) by mouth 2 (two) times daily. Call for increased weight  60 tablet  0  . levothyroxine (SYNTHROID, LEVOTHROID) 300 MCG tablet Take 300 mcg by mouth daily.        . potassium chloride SA (K-DUR,KLOR-CON) 20 MEQ tablet Take 2 tablets (40 mEq total) by mouth 2 (two) times daily.  120 tablet  3  . quinapril (ACCUPRIL) 20 MG tablet Take 1 tablet (20 mg total) by mouth at bedtime.  30 tablet  0  . ranitidine (ZANTAC) 150 MG tablet Take 150 mg by mouth 2 (two) times daily as needed. For gastric reflux        Past Medical History  Diagnosis Date  . Hypertension     09/2010-normal CMet and CBC; Lipid profile-116, 88, 25, 73  . Cardiomyopathy 08/2010    Presented with congestive heart failure; EF of 15% and 2012; hypotension on medication precludes optimal dosing  . Gastroesophageal reflux disease   . Hypothyroidism     Recent TSH was normal.  . Pneumonia   . Obesity   . Hyperlipidemia   . Tobacco abuse     20 pack years  . CHF (congestive heart failure)   . Asthma   . Shortness of breath   . Seizures   . Headache   .  Arthritis   . Anxiety     Past Surgical History  Procedure Date  . Cesarean section     X2    Family History  Problem Relation Age of Onset  . Cardiomyopathy Mother     ICD pacemaker-ischmic CM  . Heart failure Mother   . Hypertension Mother   . Cardiomyopathy Father     Deceased  . Coronary artery disease Father   . Heart failure Father   . Heart failure Brother   . Hypertension Brother     History   Social History  . Marital Status: Single    Spouse Name: N/A    Number of Children: 2  . Years of Education: N/A   Occupational History  .      Works in Science writer   Social History Main Topics  . Smoking status: Current Everyday Smoker -- 0.5 packs/day for 20 years    Types: Cigarettes    Last Attempt to Quit: 09/03/2010  . Smokeless tobacco: Not on file  . Alcohol Use: No  . Drug Use: No  . Sexually Active: No    Other Topics Concern  . Not on file   Social History Narrative  . No narrative on file    ROS:see history of present illness otherwise negative   PHYSICAL EXAM: Well-nournished, in no acute distress. Neck: No JVD, HJR, Bruit, or thyroid enlargement Lungs: No tachypnea, clear without wheezing, rales, or rhonchi Cardiovascular: RRR, PMI not displaced, heart sounds normal, no murmurs, gallops, bruit, thrill, or heave. Abdomen: BS normal. Soft without organomegaly, masses, lesions or tenderness. Extremities: without cyanosis, clubbing or edema. Good distal pulses bilateral SKin: Warm, no lesions or rashes  Musculoskeletal: No deformities Neuro: no focal signs  BP 93/56  Pulse 68  Ht 5' 7.5" (1.715 m)  Wt 273 lb (123.832 kg)  BMI 42.13 kg/m2     2Decho Study Conclusions 04/2011  - Left ventricle: The cavity size was moderately dilated.   Wall thickness was increased in a pattern of mild LVH. The   estimated ejection fraction was 20%. Diffuse hypokinesis   with some regional variation in setting of ventricular   ectopy. Doppler parameters are consistent with abnormal   left ventricular relaxation (grade 1 diastolic   dysfunction). E/E' consistent with increased left atrial   pressure. - Ventricular septum: Septal motion showed abnormal function   and dyssynergy. - Mitral valve: Moderate regurgitation. - Left atrium: The atrium was moderately dilated. - Right ventricle: The cavity size was mildly dilated.   Systolic function was normal. - Right atrium: The atrium was mildly to moderately dilated. - Atrial septum: A patent foramen ovale cannot be excluded. - Tricuspid valve: Mild regurgitation. - Pericardium, extracardiac: There was no pericardial   effusion. Impressions:  - No substantial improvement in LVEF compared to prior   study, approximately 20% with grade 1 diastolic   dysfunction and moderate chamber dilatation. Increased   left atrial pressure with at  least moderate left atrial   enlargement. Moderate mitral regurgitation. Cannot exclude   PFO with small degree of left to right shunting based on   limited images.

## 2011-04-27 NOTE — Assessment & Plan Note (Signed)
Blood pressure is on the low side but stable

## 2011-04-27 NOTE — Patient Instructions (Signed)
**Note De-Identified Melanie Cordova Obfuscation** Your physician discussed the hazards of tobacco use. Tobacco use cessation is recommended and techniques and options to help you quit were discussed.   Your physician recommends that you schedule a follow-up appointment in: 2 MONTHS

## 2011-04-27 NOTE — Assessment & Plan Note (Signed)
Discuss smoking cessation with the patient. 

## 2011-04-27 NOTE — Assessment & Plan Note (Signed)
CAD EF now 20%. Patient had recent admission for acute on chronic systolic and diastolic heart failure. She diuresed well and is feeling much better. She will be maintained on her current medications. She had lab work last week and BUN and creatinine remained stable as well as potassium.

## 2011-05-04 ENCOUNTER — Other Ambulatory Visit: Payer: Self-pay | Admitting: *Deleted

## 2011-05-04 DIAGNOSIS — I10 Essential (primary) hypertension: Secondary | ICD-10-CM

## 2011-05-17 ENCOUNTER — Other Ambulatory Visit: Payer: Self-pay | Admitting: *Deleted

## 2011-05-17 MED ORDER — FUROSEMIDE 80 MG PO TABS: 80.0000 mg | ORAL_TABLET | Freq: Two times a day (BID) | ORAL | Status: DC

## 2011-05-18 ENCOUNTER — Other Ambulatory Visit: Payer: Self-pay | Admitting: *Deleted

## 2011-05-18 MED ORDER — FUROSEMIDE 80 MG PO TABS: 80.0000 mg | ORAL_TABLET | Freq: Two times a day (BID) | ORAL | Status: DC

## 2011-05-20 ENCOUNTER — Other Ambulatory Visit: Payer: Self-pay | Admitting: *Deleted

## 2011-05-20 MED ORDER — FUROSEMIDE 80 MG PO TABS: 80.0000 mg | ORAL_TABLET | Freq: Two times a day (BID) | ORAL | Status: DC

## 2011-05-20 MED ORDER — POTASSIUM CHLORIDE CRYS ER 20 MEQ PO TBCR: 40.0000 meq | EXTENDED_RELEASE_TABLET | Freq: Two times a day (BID) | ORAL | Status: DC

## 2011-05-24 ENCOUNTER — Other Ambulatory Visit: Payer: Self-pay | Admitting: *Deleted

## 2011-05-24 MED ORDER — POTASSIUM CHLORIDE CRYS ER 20 MEQ PO TBCR: 40.0000 meq | EXTENDED_RELEASE_TABLET | Freq: Two times a day (BID) | ORAL | Status: DC

## 2011-05-24 MED ORDER — FUROSEMIDE 80 MG PO TABS: 80.0000 mg | ORAL_TABLET | Freq: Two times a day (BID) | ORAL | Status: DC

## 2011-06-06 ENCOUNTER — Other Ambulatory Visit: Payer: Self-pay | Admitting: *Deleted

## 2011-06-06 DIAGNOSIS — I10 Essential (primary) hypertension: Secondary | ICD-10-CM

## 2011-06-17 ENCOUNTER — Encounter: Payer: Self-pay | Admitting: *Deleted

## 2011-06-27 ENCOUNTER — Encounter: Payer: Self-pay | Admitting: Cardiology

## 2011-06-27 ENCOUNTER — Ambulatory Visit (INDEPENDENT_AMBULATORY_CARE_PROVIDER_SITE_OTHER): Payer: Self-pay | Admitting: Cardiology

## 2011-06-27 ENCOUNTER — Encounter: Payer: Self-pay | Admitting: *Deleted

## 2011-06-27 ENCOUNTER — Other Ambulatory Visit: Payer: Self-pay | Admitting: Cardiology

## 2011-06-27 VITALS — BP 125/69 | HR 72 | Resp 16 | Ht 67.0 in | Wt 275.0 lb

## 2011-06-27 DIAGNOSIS — I428 Other cardiomyopathies: Secondary | ICD-10-CM

## 2011-06-27 DIAGNOSIS — I10 Essential (primary) hypertension: Secondary | ICD-10-CM

## 2011-06-27 DIAGNOSIS — E876 Hypokalemia: Secondary | ICD-10-CM

## 2011-06-27 MED ORDER — POTASSIUM CHLORIDE CRYS ER 20 MEQ PO TBCR: 20.0000 meq | EXTENDED_RELEASE_TABLET | Freq: Two times a day (BID) | ORAL | Status: DC

## 2011-06-27 MED ORDER — SPIRONOLACTONE 25 MG PO TABS: 25.0000 mg | ORAL_TABLET | Freq: Every day | ORAL | Status: DC

## 2011-06-27 NOTE — Progress Notes (Signed)
Name: Melanie Cordova    DOB: [birth year]  Age: 51 y.o.  MR#: [id number]       PCP:  [name], NP, NP      Insurance: @PAYORNAME @   CC:    Chief Complaint  Patient presents with  . Appointment    fatigue, SOB, some chest discomfort  +meds    VS BP 125/69  Pulse 72  Resp 16  Ht 5\' 7"  (1.702 m)  Wt 275 lb (124.739 kg)  BMI 43.07 kg/m2  Weights Current Weight  06/27/11 275 lb (124.739 kg)  04/27/11 273 lb (123.832 kg)  04/21/11 279 lb 15.8 oz (127 kg)    Blood Pressure  BP Readings from Last 3 Encounters:  06/27/11 125/69  04/27/11 93/56  04/22/11 100/64     Admit date:  (Not on file) Last encounter with RMR:  04/25/2011   Allergy Allergies  Allergen Reactions  . Penicillins Anaphylaxis and Shortness Of Breath    Hair loss  . Iodinated Diagnostic Agents Hives and Other (See Comments)    Pulmonary problems; no frank respiratory arrest  . Iodine   . Sulfa Antibiotics Other (See Comments)    Pt can't remember reaction    Current Outpatient Prescriptions  Medication Sig Dispense Refill  . albuterol (PROVENTIL HFA) 108 (90 BASE) MCG/ACT inhaler Inhale 2 puffs into the lungs every 6 (six) hours as needed. For shortness of breath      . Alpha-D-Galactosidase (BEANO PO) Take by mouth daily.        . budesonide-formoterol (SYMBICORT) 80-4.5 MCG/ACT inhaler Inhale 2 puffs into the lungs 2 (two) times daily.      . calcium citrate-vitamin D 200-200 MG-UNIT TABS Take 1 tablet by mouth daily.      . carvedilol (COREG) 25 MG tablet Take 2 tablets twice daily  120 tablet  12  . desloratadine (CLARINEX) 5 MG tablet Take 5 mg by mouth daily.      . ferrous sulfate (IRON SUPPLEMENT) 325 (65 FE) MG tablet Take 325 mg by mouth daily with breakfast.      . fish oil-omega-3 fatty acids 1000 MG capsule Take 3 g by mouth 3 (three) times daily.        . furosemide (LASIX) 80 MG tablet Take 1 tablet (80 mg total) by mouth 2 (two) times daily. Call for increased weight  60 tablet  6  .  gabapentin (NEURONTIN) 300 MG capsule Take 300 mg by mouth daily.      Marland Kitchen levothyroxine (SYNTHROID, LEVOTHROID) 300 MCG tablet Take 300 mcg by mouth daily.        . Multiple Vitamin (MULTIVITAMIN) tablet Take 1 tablet by mouth daily.      . naproxen (NAPROSYN) 500 MG tablet Take 500 mg by mouth 2 (two) times daily with a meal.      . potassium chloride SA (K-DUR,KLOR-CON) 20 MEQ tablet Take 2 tablets (40 mEq total) by mouth 2 (two) times daily.  120 tablet  3  . quinapril (ACCUPRIL) 20 MG tablet Take 1 tablet (20 mg total) by mouth at bedtime.  30 tablet  0  . ranitidine (ZANTAC) 150 MG tablet Take 150 mg by mouth 2 (two) times daily as needed. For gastric reflux      . levothyroxine (SYNTHROID, LEVOTHROID) 112 MCG tablet 300 mcg daily.        Marland Kitchen DISCONTD: furosemide (LASIX) 40 MG tablet Take 2 tablets (80 mg total) by mouth 2 (two) times daily.  60 tablet  0  . DISCONTD: levothyroxine (SYNTHROID, LEVOTHROID) 112 MCG tablet Take 3 tablets (336 mcg total) by mouth daily.  90 tablet  0    Discontinued Meds:   There are no discontinued medications.  Patient Active Problem List  Diagnoses  . Morbid obesity  . Hypertension  . Cardiomyopathy  . Gastroesophageal reflux disease  . Hypothyroidism  . Tobacco abuse  . Hyperlipidemia  . Insomnia  . Hypokalemia  . Systolic and diastolic CHF, acute on chronic  . Bronchitis    LABS Orders Only on 04/25/2011  Component Date Value  . Sodium 04/25/2011 140   . Potassium 04/25/2011 4.4   . Chloride 04/25/2011 100   . CO2 04/25/2011 24   . Glucose, Bld 04/25/2011 80   . BUN 04/25/2011 21   . Creat 04/25/2011 0.98   . Calcium 04/25/2011 9.8   . Ferritin 04/25/2011 81   . HIV 04/25/2011 NON REACTIVE   Admission on 04/20/2011, Discharged on 04/22/2011  Component Date Value  . WBC 04/20/2011 11.5*  . RBC 04/20/2011 4.19   . Hemoglobin 04/20/2011 13.0   . HCT 04/20/2011 39.2   . MCV 04/20/2011 93.6   . [facility] 04/20/2011 31.0   . MCHC 04/20/2011  33.2   . RDW 04/20/2011 14.7   . Platelets 04/20/2011 217   . Neutrophils Relative 04/20/2011 74   . Neutro Abs 04/20/2011 8.5*  . Lymphocytes Relative 04/20/2011 18   . Lymphs Abs 04/20/2011 2.1   . Monocytes Relative 04/20/2011 5   . Monocytes Absolute 04/20/2011 0.6   . Eosinophils Relative 04/20/2011 3   . Eosinophils Absolute 04/20/2011 0.3   . Basophils Relative 04/20/2011 0   . Basophils Absolute 04/20/2011 0.1   . Sodium 04/20/2011 140   . Potassium 04/20/2011 4.8   . Chloride 04/20/2011 106   . CO2 04/20/2011 28   . Glucose, Bld 04/20/2011 99   . BUN 04/20/2011 13   . Creatinine, Ser 04/20/2011 0.78   . Calcium 04/20/2011 10.1   . Total Protein 04/20/2011 6.3   . Albumin 04/20/2011 3.3*  . AST 04/20/2011 50*  . ALT 04/20/2011 47*  . Alkaline Phosphatase 04/20/2011 101   . Total Bilirubin 04/20/2011 0.7   . GFR calc non Af Amer 04/20/2011 >90   . GFR calc Af Amer 04/20/2011 >90   . Pro B Natriuretic peptid* 04/20/2011 1563.0*  . Color, Urine 04/20/2011 YELLOW   . APPearance 04/20/2011 CLEAR   . Specific Gravity, Urine 04/20/2011 1.020   . pH 04/20/2011 6.0   . Glucose, UA 04/20/2011 NEGATIVE   . Hgb urine dipstick 04/20/2011 NEGATIVE   . Bilirubin Urine 04/20/2011 NEGATIVE   . Ketones, ur 04/20/2011 NEGATIVE   . Protein, ur 04/20/2011 NEGATIVE   . Urobilinogen, UA 04/20/2011 0.2   . Nitrite 04/20/2011 NEGATIVE   . Leukocytes, UA 04/20/2011 NEGATIVE   . Specimen Description 04/20/2011 URINE, CATHETERIZED   . Special Requests 04/20/2011 NONE   . Culture  Setup Time 04/20/2011 [id number]   . Colony Count 04/20/2011 >=100,000 COLONIES/ML   . Culture 04/20/2011 CITROBACTER KOSERI   . Report Status 04/20/2011 04/22/2011 FINAL   . Organism ID, Bacteria 04/20/2011 CITROBACTER KOSERI   . Troponin I 04/20/2011 <0.30   . Total CK 04/21/2011 41   . CK, MB 04/21/2011 1.5   . Troponin I 04/21/2011 <0.30   . Relative Index 04/21/2011 RELATIVE INDEX IS INVALID   .  Total CK 04/21/2011 35   . CK, MB 04/21/2011 1.5   .  Troponin I 04/21/2011 <0.30   . Relative Index 04/21/2011 RELATIVE INDEX IS INVALID   . Total CK 04/21/2011 26   . CK, MB 04/21/2011 1.2   . Troponin I 04/21/2011 <0.30   . Relative Index 04/21/2011 RELATIVE INDEX IS INVALID   . Pro B Natriuretic peptid* 04/21/2011 1464.0*  . WBC 04/21/2011 9.3   . RBC 04/21/2011 4.15   . Hemoglobin 04/21/2011 12.8   . HCT 04/21/2011 38.7   . MCV 04/21/2011 93.3   . [facility] 04/21/2011 30.8   . MCHC 04/21/2011 33.1   . RDW 04/21/2011 14.8   . Platelets 04/21/2011 223   . Neutrophils Relative 04/21/2011 68   . Neutro Abs 04/21/2011 6.3   . Lymphocytes Relative 04/21/2011 21   . Lymphs Abs 04/21/2011 1.9   . Monocytes Relative 04/21/2011 8   . Monocytes Absolute 04/21/2011 0.7   . Eosinophils Relative 04/21/2011 3   . Eosinophils Absolute 04/21/2011 0.3   . Basophils Relative 04/21/2011 1   . Basophils Absolute 04/21/2011 0.1   . Sodium 04/21/2011 139   . Potassium 04/21/2011 3.4*  . Chloride 04/21/2011 101   . CO2 04/21/2011 27   . Glucose, Bld 04/21/2011 141*  . BUN 04/21/2011 11   . Creatinine, Ser 04/21/2011 0.87   . Calcium 04/21/2011 9.4   . Total Protein 04/21/2011 6.5   . Albumin 04/21/2011 3.3*  . AST 04/21/2011 32   . ALT 04/21/2011 43*  . Alkaline Phosphatase 04/21/2011 101   . Total Bilirubin 04/21/2011 0.6   . GFR calc non Af Amer 04/21/2011 77*  . GFR calc Af Amer 04/21/2011 89*  . Magnesium 04/21/2011 1.9   . Sodium 04/22/2011 138   . Potassium 04/22/2011 4.2   . Chloride 04/22/2011 100   . CO2 04/22/2011 25   . Glucose, Bld 04/22/2011 130*  . BUN 04/22/2011 12   . Creatinine, Ser 04/22/2011 0.68   . Calcium 04/22/2011 9.7   . GFR calc non Af Amer 04/22/2011 >90   . GFR calc Af Amer 04/22/2011 >90   . WBC 04/22/2011 9.8   . RBC 04/22/2011 4.86   . Hemoglobin 04/22/2011 15.1*  . HCT 04/22/2011 44.8   . MCV 04/22/2011 92.2   . [facility] 04/22/2011 31.1   . MCHC  04/22/2011 33.7   . RDW 04/22/2011 14.8   . Platelets 04/22/2011 253      Results for this Opt Visit:     Results for orders placed in visit on 04/25/11  BASIC METABOLIC PANEL      Component Value Range   Sodium 140  135 - 145 (mEq/L)   Potassium 4.4  3.5 - 5.3 (mEq/L)   Chloride 100  96 - 112 (mEq/L)   CO2 24  19 - 32 (mEq/L)   Glucose, Bld 80  70 - 99 (mg/dL)   BUN 21  6 - 23 (mg/dL)   Creat 1.30  8.65 - 7.84 (mg/dL)   Calcium 9.8  8.4 - 69.6 (mg/dL)  FERRITIN      Component Value Range   Ferritin 81  10 - 291 (ng/mL)  HIV ANTIBODY (ROUTINE TESTING)      Component Value Range   HIV NON REACTIVE  NON REACTIVE     EKG Orders placed during the hospital encounter of 04/20/11  . EKG     Prior Assessment and Plan Problem List as of 06/27/2011          Cardiology Problems   Hypertension   Last  Assessment & Plan Note   04/27/2011 Office Visit Signed 04/27/2011 12:26 PM by Dyann Kief, PA    Blood pressure is on the low side but stable    Cardiomyopathy   Last Assessment & Plan Note   04/27/2011 Office Visit Signed 04/27/2011 12:26 PM by Dyann Kief, PA    CAD EF now 20%. Patient had recent admission for acute on chronic systolic and diastolic heart failure. She diuresed well and is feeling much better. She will be maintained on her current medications. She had lab work last week and BUN and creatinine remained stable as well as potassium.    Hyperlipidemia   Last Assessment & Plan Note   03/16/2011 Office Visit Signed 03/16/2011 11:28 AM by Kathlen Brunswick, MD    Patient had very low lipid values in the past when treated with rosuvastatin.  She is not currently taking any lipid lowering therapy except fish oil.  We will recheck a lipid profile.      Other   Morbid obesity   Last Assessment & Plan Note   03/16/2011 Office Visit Signed 03/16/2011 11:26 AM by Kathlen Brunswick, MD    Patient is congratulated on weight loss to date and encouraged to continue to restrict  caloric intake in the context of a low salt diet.    Gastroesophageal reflux disease   Hypothyroidism   Last Assessment & Plan Note   03/16/2011 Office Visit Addendum 03/20/2011 11:58 AM by Kathlen Brunswick, MD    Hypothyroidism was reassessed with a normal TSH in 12/2010.      Tobacco abuse   Last Assessment & Plan Note   04/27/2011 Office Visit Signed 04/27/2011 12:26 PM by Dyann Kief, PA    Discuss smoking cessation with the patient.    Insomnia   Hypokalemia   Systolic and diastolic CHF, acute on chronic   Bronchitis       Imaging: No results found.   FRS Calculation: Score not calculated. Missing: Total Cholesterol

## 2011-06-27 NOTE — Progress Notes (Signed)
Patient ID: Melanie Cordova, female   DOB: 1960-06-06, 51 y.o.   MRN: [id number]  HPI: Scheduled return visit for this nice woman with cardiomyopathy of unknown etiology.  Since her last visit, she has remained stable from a cardiac standpoint.  She reports substantial stress including loss of her job and the death of her mother after a long illness.  She was admitted to the hospital for decompensation of congestive heart failure 2 months ago and rapidly improved with diuresis.  Prior to Admission medications   Medication Sig Start Date End Date Taking? Authorizing Provider  albuterol (PROVENTIL HFA) 108 (90 BASE) MCG/ACT inhaler Inhale 2 puffs into the lungs every 6 (six) hours as needed. For shortness of breath   Yes Historical Provider, MD  Alpha-D-Galactosidase (BEANO PO) Take by mouth daily.     Yes Historical Provider, MD  budesonide-formoterol (SYMBICORT) 80-4.5 MCG/ACT inhaler Inhale 2 puffs into the lungs 2 (two) times daily.   Yes Historical Provider, MD  calcium citrate-vitamin D 200-200 MG-UNIT TABS Take 1 tablet by mouth daily.   Yes Historical Provider, MD  carvedilol (COREG) 25 MG tablet Take 2 tablets twice daily 01/14/11  Yes Kathlen Brunswick, MD  desloratadine (CLARINEX) 5 MG tablet Take 5 mg by mouth daily.   Yes Historical Provider, MD  ferrous sulfate (IRON SUPPLEMENT) 325 (65 FE) MG tablet Take 325 mg by mouth daily with breakfast.   Yes Historical Provider, MD  fish oil-omega-3 fatty acids 1000 MG capsule Take 3 g by mouth 3 (three) times daily.     Yes Historical Provider, MD  furosemide (LASIX) 80 MG tablet Take 1 tablet (80 mg total) by mouth 2 (two) times daily. Call for increased weight 05/24/11 05/23/12 Yes Kathlen Brunswick, MD  gabapentin (NEURONTIN) 300 MG capsule Take 300 mg by mouth daily.   Yes Historical Provider, MD  levothyroxine (SYNTHROID, LEVOTHROID) 300 MCG tablet Take 300 mcg by mouth daily.     Yes Historical Provider, MD  Multiple Vitamin (MULTIVITAMIN)  tablet Take 1 tablet by mouth daily.   Yes Historical Provider, MD  naproxen (NAPROSYN) 500 MG tablet Take 500 mg by mouth 2 (two) times daily with a meal.   Yes Historical Provider, MD  potassium chloride SA (K-DUR,KLOR-CON) 20 MEQ tablet Take 2 tablets (40 mEq total) by mouth 2 (two) times daily. 05/24/11 05/23/12 Yes Kathlen Brunswick, MD  quinapril (ACCUPRIL) 20 MG tablet Take 1 tablet (20 mg total) by mouth at bedtime. 04/22/11 04/21/12 Yes Nimish Normajean Glasgow, MD  ranitidine (ZANTAC) 150 MG tablet Take 150 mg by mouth 2 (two) times daily as needed. For gastric reflux   Yes Historical Provider, MD  levothyroxine (SYNTHROID, LEVOTHROID) 112 MCG tablet 300 mcg daily.   11/03/10 11/17/10  Wilson Singer, MD   Allergies  Allergen Reactions  . Penicillins Anaphylaxis and Shortness Of Breath    Hair loss  . Iodinated Diagnostic Agents Hives and Other (See Comments)    Pulmonary problems; no frank respiratory arrest  . Iodine   . Sulfa Antibiotics Other (See Comments)    Pt can't remember reaction   Past medical history, social history, and family history reviewed and updated.  ROS: Denies orthopnea, PND, pedal edema, palpitations, lightheadedness or syncope.  She does describe mild atypical chest discomfort, dyspnea with exertion and generalized fatigue.  PHYSICAL EXAM: BP 125/69  Pulse 72  Resp 16  Ht 5\' 7"  (1.702 m)  Wt 124.739 kg (275 lb)  BMI 43.07 kg/m2  General-Well developed; no acute distress; poor dentition Body habitus-obese Neck-No JVD; no carotid bruits Lungs-clear lung fields; resonant to percussion Cardiovascular-normal PMI; normal S1 and S2 Abdomen-normal bowel sounds; soft and non-tender without masses or organomegaly Musculoskeletal-No deformities, no cyanosis or clubbing Neurologic-Normal cranial nerves; symmetric strength and tone Skin-Warm, no significant lesions Extremities-distal pulses intact; 1/2+ edema   ASSESSMENT AND PLAN:  Melanie Bing, MD 06/27/2011 1:17  PM  AH

## 2011-06-27 NOTE — Patient Instructions (Addendum)
Your physician recommends that you schedule a follow-up appointment in:  1 - 5 weeks with Dr Dietrich Pates 1 - Appt with Dr Ladona Ridgel to consider defibrillator placement  Your physician recommends that you return for lab work in: 1 week (next Monday)  and again in 3 weeks   Your physician has recommended you make the following change in your medication:  1 - DECREASE Potassium to 20 meq twice a day 2 - START Spironolactone 25 mg daily

## 2011-06-28 ENCOUNTER — Encounter: Payer: Self-pay | Admitting: *Deleted

## 2011-06-28 LAB — BASIC METABOLIC PANEL
BUN: 14 mg/dL (ref 6–23)
CO2: 28 mEq/L (ref 19–32)
Calcium: 9.3 mg/dL (ref 8.4–10.5)
Chloride: 103 mEq/L (ref 96–112)
Creat: 0.63 mg/dL (ref 0.50–1.10)
Glucose, Bld: 108 mg/dL — ABNORMAL HIGH (ref 70–99)

## 2011-07-03 NOTE — Assessment & Plan Note (Addendum)
She is doing well symptomatically.  Weight is decreased approximately 5 pounds when compared to her her peak in the hospital, but increased a few pounds since her last visit.  Patient is advised to follow weights at home and call for a 4 pound increase.  Since status is somewhat tenuous, Aldactone will be added to her medical regime with close monitoring of electrolytes and renal function.  Patient will be referred to Dr. Ladona Ridgel for consideration of AICD implantation.

## 2011-07-03 NOTE — Assessment & Plan Note (Signed)
Potassium supplement will be decreased in the face of initiating therapy with spironolactone.

## 2011-07-03 NOTE — Assessment & Plan Note (Signed)
Systolic blood pressure has been in the 90-120 range.  Cardiac medications will be adjusted as permitted by her relative hypotension.

## 2011-07-04 ENCOUNTER — Other Ambulatory Visit: Payer: Self-pay | Admitting: Cardiology

## 2011-07-05 LAB — BASIC METABOLIC PANEL
BUN: 14 mg/dL (ref 6–23)
Calcium: 9.1 mg/dL (ref 8.4–10.5)
Chloride: 100 mEq/L (ref 96–112)
Creat: 0.73 mg/dL (ref 0.50–1.10)

## 2011-07-07 ENCOUNTER — Emergency Department (HOSPITAL_COMMUNITY): Payer: Self-pay

## 2011-07-07 ENCOUNTER — Emergency Department (HOSPITAL_COMMUNITY)
Admission: EM | Admit: 2011-07-07 | Discharge: 2011-07-08 | Disposition: A | Discharge status: Home / Self Care | Payer: Self-pay | Attending: Emergency Medicine | Admitting: Emergency Medicine

## 2011-07-07 ENCOUNTER — Encounter (HOSPITAL_COMMUNITY): Payer: Self-pay

## 2011-07-07 DIAGNOSIS — E039 Hypothyroidism, unspecified: Secondary | ICD-10-CM | POA: Insufficient documentation

## 2011-07-07 DIAGNOSIS — J45909 Unspecified asthma, uncomplicated: Secondary | ICD-10-CM | POA: Insufficient documentation

## 2011-07-07 DIAGNOSIS — R0602 Shortness of breath: Secondary | ICD-10-CM | POA: Insufficient documentation

## 2011-07-07 DIAGNOSIS — I493 Ventricular premature depolarization: Secondary | ICD-10-CM

## 2011-07-07 DIAGNOSIS — I4949 Other premature depolarization: Secondary | ICD-10-CM | POA: Insufficient documentation

## 2011-07-07 DIAGNOSIS — R21 Rash and other nonspecific skin eruption: Secondary | ICD-10-CM | POA: Insufficient documentation

## 2011-07-07 DIAGNOSIS — R002 Palpitations: Secondary | ICD-10-CM | POA: Insufficient documentation

## 2011-07-07 DIAGNOSIS — I428 Other cardiomyopathies: Secondary | ICD-10-CM | POA: Insufficient documentation

## 2011-07-07 DIAGNOSIS — I10 Essential (primary) hypertension: Secondary | ICD-10-CM | POA: Insufficient documentation

## 2011-07-07 DIAGNOSIS — L270 Generalized skin eruption due to drugs and medicaments taken internally: Secondary | ICD-10-CM

## 2011-07-07 DIAGNOSIS — I509 Heart failure, unspecified: Secondary | ICD-10-CM | POA: Insufficient documentation

## 2011-07-07 DIAGNOSIS — F17200 Nicotine dependence, unspecified, uncomplicated: Secondary | ICD-10-CM | POA: Insufficient documentation

## 2011-07-07 HISTORY — DX: Polyneuropathy, unspecified: G62.9

## 2011-07-07 LAB — PROTIME-INR: INR: 0.91 (ref 0.00–1.49)

## 2011-07-07 LAB — COMPREHENSIVE METABOLIC PANEL
Alkaline Phosphatase: 104 U/L (ref 39–117)
BUN: 15 mg/dL (ref 6–23)
CO2: 31 mEq/L (ref 19–32)
Chloride: 100 mEq/L (ref 96–112)
Creatinine, Ser: 0.79 mg/dL (ref 0.50–1.10)
GFR calc Af Amer: >90 mL/min (ref >90)
GFR calc non Af Amer: >90 mL/min (ref >90)
Glucose, Bld: 104 mg/dL — ABNORMAL HIGH (ref 70–99)
Potassium: 3.7 mEq/L (ref 3.5–5.1)
Total Bilirubin: 0.2 mg/dL — ABNORMAL LOW (ref 0.3–1.2)

## 2011-07-07 LAB — DIFFERENTIAL
Basophils Relative: 1 % (ref 0–1)
Lymphocytes Relative: 30 % (ref 12–46)
Lymphs Abs: 2.4 10*3/uL (ref 0.7–4.0)
Monocytes Absolute: 0.7 10*3/uL (ref 0.1–1.0)
Monocytes Relative: 8 % (ref 3–12)
Neutro Abs: 4.5 10*3/uL (ref 1.7–7.7)
Neutrophils Relative %: 57 % (ref 43–77)

## 2011-07-07 LAB — D-DIMER, QUANTITATIVE: D-Dimer, Quant: 0.31 ug/mL-FEU (ref 0.00–0.48)

## 2011-07-07 LAB — CBC
HCT: 41 % (ref 36.0–46.0)
Hemoglobin: 13.4 g/dL (ref 12.0–15.0)
MCHC: 32.7 g/dL (ref 30.0–36.0)
RBC: 4.44 MIL/uL (ref 3.87–5.11)

## 2011-07-07 LAB — POCT I-STAT TROPONIN I: POC Troponin I: 0.01

## 2011-07-07 NOTE — ED Notes (Signed)
Pt reports feeling like heart is fluttering or skipping. Denies pain. Started this afternoon.

## 2011-07-07 NOTE — ED Provider Notes (Cosign Needed)
History  This chart was scribed for Melanie Cooper III, MD by [name]. The patient was seen in room APA02/APA02. Patient's care was started at 59.  CSN: [id number]  Arrival date & time 07/07/11  1903   First MD Initiated Contact with Patient 07/07/11 1956      Chief Complaint  Patient presents with  . Palpitations    (Consider location/radiation/quality/duration/timing/severity/associated sxs/prior treatment) HPI  Melanie Cordova is a 51 y.o. female with a h/o HTN, cardiomyopathy, and CHF who presents to the Emergency Department complaining of 6 hours of sudden onset, unchanged, constant palpations described as "fluttering" with associated chest pain described as "sharp with little needles", SOB, fatigue, chills, coughing, and light-headedness. Pt reports that she was feeling fatigued this afternoon and took a nap. When pt woke up she reports that her other symptoms began. Pt also states that she has a "tingly" rash on the right side of her face, neck, and right arm. Pt reports that she was put on two new medications recently - spironolactone and naproxen. Pt denies fever, vision changes, vomiting, diarrhea, dysuria, fainting, and seizures. Pt has a h/o seizures, hyperlipidemia, hypothyroidism, neuropathy, and SOB. Pt has had two cesarean sections and reports that her menstruation stopped about a year ago. Pt is a current everyday smoker and denies alcohol use.    Past Medical History  Diagnosis Date  . Hypertension     09/2010-normal CMet and CBC; Lipid profile-116, 88, 25, 73  . Cardiomyopathy 08/2010    Presented with congestive heart failure; EF of 15% and 2012; hypotension on medication precludes optimal dosing  . Gastroesophageal reflux disease   . Hypothyroidism     Recent TSH was normal.  . Pneumonia   . Obesity   . Hyperlipidemia   . Tobacco abuse     20 pack years  . CHF (congestive heart failure)   . Asthma   . Shortness of breath   . Seizures   . Headache     . Arthritis   . Anxiety   . Neuropathy     Past Surgical History  Procedure Date  . Cesarean section     X2    Family History  Problem Relation Age of Onset  . Cardiomyopathy Mother     ICD pacemaker-ischmic CM  . Heart failure Mother   . Hypertension Mother   . Cardiomyopathy Father     Deceased  . Coronary artery disease Father   . Heart failure Father   . Heart failure Brother   . Hypertension Brother     History  Substance Use Topics  . Smoking status: Current Everyday Smoker -- 0.5 packs/day for 20 years    Types: Cigarettes    Last Attempt to Quit: 09/03/2010  . Smokeless tobacco: Not on file  . Alcohol Use: No    OB History    Grav Para Term Preterm Abortions TAB SAB Ect Mult Living                  Review of Systems  Constitutional: Positive for chills and fatigue. Negative for fever.  HENT: Negative for ear pain and sore throat.   Eyes: Negative for visual disturbance.  Respiratory: Positive for cough and shortness of breath.   Cardiovascular: Positive for chest pain and palpitations.  Gastrointestinal: Negative for vomiting and diarrhea.  Genitourinary: Negative for dysuria.  Neurological: Positive for light-headedness. Negative for seizures and syncope.  All other systems reviewed and are negative.    Allergies  Penicillins; Iodinated diagnostic agents; Iodine; and Sulfa antibiotics  Home Medications   Current Outpatient Rx  Name Route Sig Dispense Refill  . ALBUTEROL SULFATE HFA 108 (90 BASE) MCG/ACT IN AERS Inhalation Inhale 2 puffs into the lungs every 6 (six) hours as needed. For shortness of breath    . BEANO PO Oral Take by mouth daily.      . BUDESONIDE-FORMOTEROL FUMARATE 80-4.5 MCG/ACT IN AERO Inhalation Inhale 2 puffs into the lungs 2 (two) times daily.    Marland Kitchen CALCIUM CITRATE-VITAMIN D 200-200 MG-UNIT PO TABS Oral Take 1 tablet by mouth daily.    Marland Kitchen CARVEDILOL 25 MG PO TABS  Take 2 tablets twice daily 120 tablet 12  . DESLORATADINE  5 MG PO TABS Oral Take 5 mg by mouth daily.    Marland Kitchen FERROUS SULFATE 325 (65 FE) MG PO TABS Oral Take 325 mg by mouth daily with breakfast.    . OMEGA-3 FATTY ACIDS 1000 MG PO CAPS Oral Take 3 g by mouth 3 (three) times daily.      . FUROSEMIDE 80 MG PO TABS Oral Take 1 tablet (80 mg total) by mouth 2 (two) times daily. Call for increased weight 60 tablet 6  . GABAPENTIN 300 MG PO CAPS Oral Take 300 mg by mouth daily.    Marland Kitchen LEVOTHYROXINE SODIUM 300 MCG PO TABS Oral Take 300 mcg by mouth daily.      Marland Kitchen ONE-DAILY MULTI VITAMINS PO TABS Oral Take 1 tablet by mouth daily.    Marland Kitchen NAPROXEN 500 MG PO TABS Oral Take 500 mg by mouth 2 (two) times daily with a meal.    . POTASSIUM CHLORIDE CRYS ER 20 MEQ PO TBCR Oral Take 1 tablet (20 mEq total) by mouth 2 (two) times daily. 60 tablet 12  . QUINAPRIL HCL 20 MG PO TABS Oral Take 1 tablet (20 mg total) by mouth at bedtime. 30 tablet 0  . RANITIDINE HCL 150 MG PO TABS Oral Take 150 mg by mouth 2 (two) times daily as needed. For gastric reflux    . SPIRONOLACTONE 25 MG PO TABS Oral Take 1 tablet (25 mg total) by mouth daily. 30 tablet 12  . LEVOTHYROXINE SODIUM 112 MCG PO TABS  300 mcg daily.        Triage Vitals: BP 118/54  Pulse 64  Temp(Src) 97.6 F (36.4 C) (Oral)  Resp 20  Ht 5' 7.5" (1.715 m)  Wt 275 lb (124.739 kg)  BMI 42.44 kg/m2  SpO2 99%  Physical Exam  Nursing note and vitals reviewed. Constitutional: She is oriented to person, place, and time. She appears well-developed and well-nourished.  HENT:  Head: Normocephalic and atraumatic.  Eyes: Conjunctivae are normal. Pupils are equal, round, and reactive to light.  Neck: Normal range of motion. Neck supple.  Cardiovascular: Normal rate, regular rhythm and normal heart sounds.   Pulmonary/Chest: Effort normal and breath sounds normal. No respiratory distress.  Abdominal: Soft. Bowel sounds are normal. She exhibits no distension. There is no tenderness.  Musculoskeletal: Normal range of motion.  She exhibits no edema (no peripheral edema).  Neurological: She is alert and oriented to person, place, and time.  Skin: Skin is warm and dry. Rash (rash on right side of face and neck and right arm) noted.  Psychiatric: She has a normal mood and affect. Her behavior is normal.    ED Course  Procedures (including critical care time)  DIAGNOSTIC STUDIES: Oxygen Saturation is 99% on room air, normal by my  interpretation.    COORDINATION OF CARE:  8:17 PM  Date: 07/07/2011  Rate: 72  Rhythm: normal sinus rhythm and premature ventricular contractions (PVC)  QRS Axis: left  Intervals: QT prolonged PQRS:  Left atrial abnormality  ST/T Wave abnormalities: Inverted T waves in lateral precordial leads.  Conduction Disutrbances:none  Narrative Interpretation: Abnormal EKG  Old EKG Reviewed: unchanged  8:20PM - Patient understands and agrees with initial ED impression and plan with expectations set for ED visit.  Results for orders placed during the hospital encounter of 07/07/11  CBC      Component Value Range   WBC 8.0  4.0 - 10.5 (K/uL)   RBC 4.44  3.87 - 5.11 (MIL/uL)   Hemoglobin 13.4  12.0 - 15.0 (g/dL)   HCT 16.1  09.6 - 04.5 (%)   MCV 92.3  78.0 - 100.0 (fL)   MCH 30.2  26.0 - 34.0 (pg)   MCHC 32.7  30.0 - 36.0 (g/dL)   RDW 40.9  81.1 - 91.4 (%)   Platelets 199  150 - 400 (K/uL)  DIFFERENTIAL      Component Value Range   Neutrophils Relative 57  43 - 77 (%)   Neutro Abs 4.5  1.7 - 7.7 (K/uL)   Lymphocytes Relative 30  12 - 46 (%)   Lymphs Abs 2.4  0.7 - 4.0 (K/uL)   Monocytes Relative 8  3 - 12 (%)   Monocytes Absolute 0.7  0.1 - 1.0 (K/uL)   Eosinophils Relative 4  0 - 5 (%)   Eosinophils Absolute 0.3  0.0 - 0.7 (K/uL)   Basophils Relative 1  0 - 1 (%)   Basophils Absolute 0.0  0.0 - 0.1 (K/uL)  COMPREHENSIVE METABOLIC PANEL      Component Value Range   Sodium 139  135 - 145 (mEq/L)   Potassium 3.7  3.5 - 5.1 (mEq/L)   Chloride 100  96 - 112 (mEq/L)   CO2 31  19  - 32 (mEq/L)   Glucose, Bld 104 (*) 70 - 99 (mg/dL)   BUN 15  6 - 23 (mg/dL)   Creatinine, Ser 7.82  0.50 - 1.10 (mg/dL)   Calcium 9.3  8.4 - 95.6 (mg/dL)   Total Protein 6.9  6.0 - 8.3 (g/dL)   Albumin 3.5  3.5 - 5.2 (g/dL)   AST 15  0 - 37 (U/L)   ALT 21  0 - 35 (U/L)   Alkaline Phosphatase 104  39 - 117 (U/L)   Total Bilirubin 0.2 (*) 0.3 - 1.2 (mg/dL)   GFR calc non Af Amer >90  >90 (mL/min)   GFR calc Af Amer >90  >90 (mL/min)  D-DIMER, QUANTITATIVE      Component Value Range   D-Dimer, Quant 0.31  0.00 - 0.48 (ug/mL-FEU)  APTT      Component Value Range   aPTT 26  24 - 37 (seconds)  PROTIME-INR      Component Value Range   Prothrombin Time 12.5  11.6 - 15.2 (seconds)   INR 0.91  0.00 - 1.49   POCT I-STAT TROPONIN I      Component Value Range   Troponin i, poc 0.01  0.00 - 0.08 (ng/mL)   Comment 3       POC Troponin I 0.01     Dg Chest 2 View  07/07/2011  *RADIOLOGY REPORT*  Clinical Data: Palpitations  CHEST - 2 VIEW  Comparison: 04/20/2011  Findings: Mild cardiomegaly noted.  No evidence for  edema or focal pulmonary opacity.  No pleural effusion.  Cardiac leads obscure detail.  No acute osseous finding.  IMPRESSION: Cardiomegaly without focal acute finding.  Original Report Authenticated By: Harrel Lemon, M.D.    Lab workup was essentially negative.  Pt's evaluation completed by Dr. Dione Booze, who advised pt to stop spironolactone and followup with Dr. Dietrich Pates, her cardiologist.      1. PVC (premature ventricular contraction)   2. Drug rash      I personally performed the services described in this documentation, which was scribed in my presence. The recorded information has been reviewed and considered.  Osvaldo Human, M.D.   Melanie Cooper III, MD 07/08/11 1520

## 2011-07-08 ENCOUNTER — Telehealth: Payer: Self-pay | Admitting: Cardiology

## 2011-07-08 LAB — URINALYSIS, ROUTINE W REFLEX MICROSCOPIC
Glucose, UA: NEGATIVE mg/dL
Ketones, ur: NEGATIVE mg/dL
Protein, ur: NEGATIVE mg/dL
Urobilinogen, UA: 0.2 mg/dL (ref 0.0–1.0)

## 2011-07-08 LAB — URINE MICROSCOPIC-ADD ON

## 2011-07-08 NOTE — ED Provider Notes (Signed)
Patient presented with palpitations today and also complaints of a rash on her face chest and arm which started over the last 4 days. She had been started on spironolactone about one week ago and it seems likely that the rash is secondary to the spironolactone. For monitor shows PVCs which seem to correlate with when she notices the palpitations. She is reassured of the benign nature of the palpitations and is advised to discontinue the spironolactone. She is to followup with her cardiologist in the next week to see if he wishes to replace the spironolactone with another a potassium sparing agent.  Results for orders placed during the hospital encounter of 07/07/11  CBC      Component Value Range   WBC 8.0  4.0 - 10.5 (K/uL)   RBC 4.44  3.87 - 5.11 (MIL/uL)   Hemoglobin 13.4  12.0 - 15.0 (g/dL)   HCT 29.5  62.1 - 30.8 (%)   MCV 92.3  78.0 - 100.0 (fL)   MCH 30.2  26.0 - 34.0 (pg)   MCHC 32.7  30.0 - 36.0 (g/dL)   RDW 65.7  84.6 - 96.2 (%)   Platelets 199  150 - 400 (K/uL)  DIFFERENTIAL      Component Value Range   Neutrophils Relative 57  43 - 77 (%)   Neutro Abs 4.5  1.7 - 7.7 (K/uL)   Lymphocytes Relative 30  12 - 46 (%)   Lymphs Abs 2.4  0.7 - 4.0 (K/uL)   Monocytes Relative 8  3 - 12 (%)   Monocytes Absolute 0.7  0.1 - 1.0 (K/uL)   Eosinophils Relative 4  0 - 5 (%)   Eosinophils Absolute 0.3  0.0 - 0.7 (K/uL)   Basophils Relative 1  0 - 1 (%)   Basophils Absolute 0.0  0.0 - 0.1 (K/uL)  COMPREHENSIVE METABOLIC PANEL      Component Value Range   Sodium 139  135 - 145 (mEq/L)   Potassium 3.7  3.5 - 5.1 (mEq/L)   Chloride 100  96 - 112 (mEq/L)   CO2 31  19 - 32 (mEq/L)   Glucose, Bld 104 (*) 70 - 99 (mg/dL)   BUN 15  6 - 23 (mg/dL)   Creatinine, Ser 9.52  0.50 - 1.10 (mg/dL)   Calcium 9.3  8.4 - 84.1 (mg/dL)   Total Protein 6.9  6.0 - 8.3 (g/dL)   Albumin 3.5  3.5 - 5.2 (g/dL)   AST 15  0 - 37 (U/L)   ALT 21  0 - 35 (U/L)   Alkaline Phosphatase 104  39 - 117 (U/L)   Total  Bilirubin 0.2 (*) 0.3 - 1.2 (mg/dL)   GFR calc non Af Amer >90  >90 (mL/min)   GFR calc Af Amer >90  >90 (mL/min)  URINALYSIS, ROUTINE W REFLEX MICROSCOPIC      Component Value Range   Color, Urine YELLOW  YELLOW    APPearance CLOUDY (*) CLEAR    Specific Gravity, Urine 1.010  1.005 - 1.030    pH 5.5  5.0 - 8.0    Glucose, UA NEGATIVE  NEGATIVE (mg/dL)   Hgb urine dipstick NEGATIVE  NEGATIVE    Bilirubin Urine NEGATIVE  NEGATIVE    Ketones, ur NEGATIVE  NEGATIVE (mg/dL)   Protein, ur NEGATIVE  NEGATIVE (mg/dL)   Urobilinogen, UA 0.2  0.0 - 1.0 (mg/dL)   Nitrite NEGATIVE  NEGATIVE    Leukocytes, UA SMALL (*) NEGATIVE   D-DIMER, QUANTITATIVE  Component Value Range   D-Dimer, Quant 0.31  0.00 - 0.48 (ug/mL-FEU)  APTT      Component Value Range   aPTT 26  24 - 37 (seconds)  PROTIME-INR      Component Value Range   Prothrombin Time 12.5  11.6 - 15.2 (seconds)   INR 0.91  0.00 - 1.49   POCT I-STAT TROPONIN I      Component Value Range   Troponin i, poc 0.01  0.00 - 0.08 (ng/mL)   Comment 3       POC Troponin I 0.01    URINE MICROSCOPIC-ADD ON      Component Value Range   Squamous Epithelial / LPF MANY (*) RARE    WBC, UA 3-6  <3 (WBC/hpf)   RBC / HPF 0-2  <3 (RBC/hpf)   Bacteria, UA MANY (*) RARE    Dg Chest 2 View  07/07/2011  *RADIOLOGY REPORT*  Clinical Data: Palpitations  CHEST - 2 VIEW  Comparison: 04/20/2011  Findings: Mild cardiomegaly noted.  No evidence for edema or focal pulmonary opacity.  No pleural effusion.  Cardiac leads obscure detail.  No acute osseous finding.  IMPRESSION: Cardiomegaly without focal acute finding.  Original Report Authenticated By: Harrel Lemon, M.D.      Dione Booze, MD 07/08/11 [phone]

## 2011-07-08 NOTE — Telephone Encounter (Signed)
Patient was told by ER provider that she needed to f/u with [facility] on Monday.  She had an allergic rx to

## 2011-07-08 NOTE — Discharge Instructions (Signed)
I think your rash is a reaction to the new prescription you have for spironolactone. Please stop taking the spironolactone. Call your cardiologist in the morning to have a followup visit in the next several days. He may need to give you a different medication to take the place of the spironolactone.  Premature Ventricular Contraction Premature ventricular contraction (PVC) is an irregularity of the heart rhythm involving extra or skipped heartbeats. In some cases, they may occur without obvious cause or heart disease. Other times, they can be caused by an electrolyte change in the blood. These need to be corrected. They can also be seen when there is not enough oxygen going to the heart. A common cause of this is plaque or cholesterol buildup. This buildup decreases the blood supply to the heart. In addition, extra beats may be caused or aggravated by:  Excessive smoking.   Alcohol consumption.   Caffeine.   Certain medications   Some street drugs.  SYMPTOMS   The sensation of feeling your heart skipping a beat (palpitations).   In many cases, the person may have no symptoms.  SIGNS AND TESTS   A physical examination may show an occasional irregularity, but if the PVC beats do not happen often, they may not be found on physical exam.   Blood pressure is usually normal.   Other tests that may find extra beats of the heart are:   An EKG (electrocardiogram)   A Holter monitor which can monitor your heart over longer periods of time   An Angiogram (study of the heart arteries).  TREATMENT  Usually extra heartbeats do not need treatment. The condition is treated only if symptoms are severe or if extra beats are very frequent or are causing problems. An underlying cause, if discovered, may also require treatment.  Treatment may also be needed if there may be a risk for other more serious cardiac arrhythmias.  PREVENTION   Moderation in caffeine, alcohol, and tobacco use may reduce the  risk of ectopic heartbeats in some people.   Exercise often helps people who lead a sedentary (inactive) lifestyle.  PROGNOSIS  PVC heartbeats are generally harmless and do not need treatment.  RISKS AND COMPLICATIONS   Ventricular tachycardia (occasionally).   There usually are no complications.   Other arrhythmias (occasionally).  SEEK IMMEDIATE MEDICAL CARE IF:   You feel palpitations that are frequent or continual.   You develop chest pain or other problems such as shortness of breath, sweating, or nausea and vomiting.   You become light-headed or faint (pass out).   You get worse or do not improve with treatment.  Document Released: 10/16/2003 Document Revised: 02/17/2011 Document Reviewed: 04/27/2007 [facility] Patient Information 2012 [place], [place].  Drug Rash Skin reactions can be caused by several different drugs. Allergy to the medicine can cause itching, hives, and other rashes. Sun exposure causes a red rash with some medicines. Mononucleosis virus can cause a similar red rash when you are taking antibiotics. Sometimes, the rash may be accompanied by pain. The drug rash may happen with new drugs or with medicines that you have been taking for a while. The rash cannot be spread from person to person. In most cases, the symptoms of a drug rash are gone within a few days of stopping the medicine. Your rash, including hives (urticaria), is most likely from the following medicines:  Antibiotics or antimicrobials.   Anticonvulsants or seizure medicines.   Antihypertensives or blood pressure medicines.   Antimalarials.  Antidepressants or depression medicines.   Antianxiety drugs.   Diuretics or water pills.   Nonsteroidal anti-inflammatory drugs.   Simvastatin.   Lithium.   Omeprazole.   Allopurinol.   Pseudoephedrine.   Amiodarone.   Packed red blood cells, when you get a blood transfusion.   Contrast media, such as when getting an imaging test (CT or  CAT scan).  This drug list is not all inclusive, but drug rashes have been reported with all the medicines listed above.Your caregiver will tell you which medicines to avoid. If you react to a medicine, a similar or worse reaction can occur the next time you take it. If you need to stop taking an antibiotic because of a drug rash, an alternative antibiotic may be needed to get rid of your infection. Antihistamine or cortisone drugs may be prescribed to help relieve your symptoms. Stay out of the sun until the rash is completely gone.  Be sure to let your caregiver know about your drug reaction. Do not take this medicine in the future. Call your caregiver if your drug rash does not improve within 3 to 4 days. SEEK IMMEDIATE MEDICAL CARE IF:   You develop breathing problems, swelling in the throat, or wheezing.   You have weakness, fainting, fever, and muscle or joint pains.   You develop blisters or peeling of skin, especially around the mouth.  Document Released: 04/07/2004 Document Revised: 02/17/2011 Document Reviewed: 01/17/2008 [facility] Patient Information 2012 [place], [place].

## 2011-07-11 ENCOUNTER — Encounter: Payer: Self-pay | Admitting: Adult Health

## 2011-07-11 ENCOUNTER — Ambulatory Visit (INDEPENDENT_AMBULATORY_CARE_PROVIDER_SITE_OTHER): Payer: MEDICAID | Admitting: Adult Health

## 2011-07-11 VITALS — BP 105/73 | HR 70 | Resp 18 | Ht 67.0 in | Wt 277.0 lb

## 2011-07-11 DIAGNOSIS — I428 Other cardiomyopathies: Secondary | ICD-10-CM

## 2011-07-11 DIAGNOSIS — F17200 Nicotine dependence, unspecified, uncomplicated: Secondary | ICD-10-CM

## 2011-07-11 DIAGNOSIS — I10 Essential (primary) hypertension: Secondary | ICD-10-CM

## 2011-07-11 DIAGNOSIS — Z720 Tobacco use: Secondary | ICD-10-CM

## 2011-07-11 MED ORDER — POTASSIUM CHLORIDE CRYS ER 20 MEQ PO TBCR: 40.0000 meq | EXTENDED_RELEASE_TABLET | Freq: Two times a day (BID) | ORAL | Status: DC

## 2011-07-11 NOTE — Progress Notes (Signed)
HPI: Mrs. Goin is an obese female patient of Dr. Dietrich Pates we are following for ongoing assessment of  Chronic mixed CHF, hypertension, hyperlipidemia, and hypokalemia, with history of ongoing tobacco abuse, and hypothyroidism. She comes today after recent ER evaluation for rash an palpitations. She was seen by Dr Dietrich Pates on 07/03/2011 with addition of spironolactone added to her medications to aid in potassium status and fluid retention. Three days later, she had a flaky, pruritic rash on her face and scalp. She also complained of increasing incidences of rapid heart rate in spells. On evaluation in th ER lab work revealed normal K+  3.7, Creatinine of 0.79. No evidence of anemia. No CHF. She was advised to stop spironolactone and continue current potassium regimen of 20 mEq of BID. She was found to have frequent benign PVC's per telemetry.  Since stopping spironolactone, she is feeling better with resolution of rash and pruritis. She has gained a couple of pounds but admits to dietary noncompliance,eating fried shrimp and chicken a couple of days ago,. Otherwise she is feeling at baseline.  Allergies  Allergen Reactions  . Penicillins Anaphylaxis and Shortness Of Breath    Hair loss  . Iodinated Diagnostic Agents Hives and Other (See Comments)    Pulmonary problems; no frank respiratory arrest  . Iodine   . Sulfa Antibiotics Other (See Comments)    Pt can't remember reaction  . Spironolactone Rash    Current Outpatient Prescriptions  Medication Sig Dispense Refill  . albuterol (PROVENTIL HFA) 108 (90 BASE) MCG/ACT inhaler Inhale 2 puffs into the lungs every 6 (six) hours as needed. For shortness of breath      . Alpha-D-Galactosidase (BEANO PO) Take by mouth daily.        . budesonide-formoterol (SYMBICORT) 80-4.5 MCG/ACT inhaler Inhale 2 puffs into the lungs 2 (two) times daily.      . calcium citrate-vitamin D 200-200 MG-UNIT TABS Take 1 tablet by mouth daily.      . carvedilol  (COREG) 25 MG tablet Take 2 tablets twice daily  120 tablet  12  . desloratadine (CLARINEX) 5 MG tablet Take 5 mg by mouth daily.      . ferrous sulfate (IRON SUPPLEMENT) 325 (65 FE) MG tablet Take 325 mg by mouth daily with breakfast.      . fish oil-omega-3 fatty acids 1000 MG capsule Take 3 g by mouth 3 (three) times daily.        . furosemide (LASIX) 80 MG tablet Take 1 tablet (80 mg total) by mouth 2 (two) times daily. Call for increased weight  60 tablet  6  . gabapentin (NEURONTIN) 300 MG capsule Take 300 mg by mouth daily.      Marland Kitchen levothyroxine (SYNTHROID, LEVOTHROID) 300 MCG tablet Take 300 mcg by mouth daily.        . Multiple Vitamin (MULTIVITAMIN) tablet Take 1 tablet by mouth daily.      . naproxen (NAPROSYN) 500 MG tablet Take 500 mg by mouth 2 (two) times daily with a meal.      . potassium chloride SA (K-DUR,KLOR-CON) 20 MEQ tablet Take 2 tablets (40 mEq total) by mouth 2 (two) times daily.  60 tablet  12  . quinapril (ACCUPRIL) 20 MG tablet Take 1 tablet (20 mg total) by mouth at bedtime.  30 tablet  0  . ranitidine (ZANTAC) 150 MG tablet Take 150 mg by mouth 2 (two) times daily as needed. For gastric reflux      . levothyroxine (  SYNTHROID, LEVOTHROID) 112 MCG tablet 300 mcg daily.        Marland Kitchen DISCONTD: furosemide (LASIX) 40 MG tablet Take 2 tablets (80 mg total) by mouth 2 (two) times daily.  60 tablet  0  . DISCONTD: levothyroxine (SYNTHROID, LEVOTHROID) 112 MCG tablet Take 3 tablets (336 mcg total) by mouth daily.  90 tablet  0    Past Medical History  Diagnosis Date  . Hypertension     09/2010-normal CMet and CBC; Lipid profile-116, 88, 25, 73  . Cardiomyopathy 08/2010    Presented with congestive heart failure; EF of 15% and 2012; hypotension on medication precludes optimal dosing  . Gastroesophageal reflux disease   . Hypothyroidism     Recent TSH was normal.  . Pneumonia   . Obesity   . Hyperlipidemia   . Tobacco abuse     20 pack years  . CHF (congestive heart  failure)   . Asthma   . Shortness of breath   . Seizures   . Headache   . Arthritis   . Anxiety   . Neuropathy     Past Surgical History  Procedure Date  . Cesarean section     X2    AOZ:HYQMVH of systems complete and found to be negative unless listed above  PHYSICAL EXAM BP 105/73  Pulse 70  Resp 18  Ht 5\' 7"  (1.702 m)  Wt 277 lb (125.646 kg)  BMI 43.38 kg/m2  General: Well developed, well nourished, in no acute distress Head: Eyes PERRLA, Bilateral xanthomas.   Normal cephalic and atraumatic  Lungs: Clear bilaterally to auscultation and percussion. Heart: HRRR S1 S2, without MRG.  Pulses are 2+ & equal.            No carotid bruit. No JVD.  No abdominal bruits. No femoral bruits. Abdomen: Bowel sounds are positive, abdomen soft and non-tender without masses or   Hernia's noted.Obese Msk:  Back normal, normal gait. Normal strength and tone for age. Extremities: No clubbing, cyanosis or edema.  DP +1 Neuro: Alert and oriented X 3. Psych:  Good affect, responds appropriately    ASSESSMENT AND PLAN

## 2011-07-11 NOTE — Patient Instructions (Addendum)
**Note De-Identified Rad Gramling Obfuscation** Your physician recommends that you continue on your current medications as directed. Please refer to the Current Medication list given to you today.  Your physician recommends that you return for lab work in: the week of Jul 25, 2011   Your physician recommends that you schedule a follow-up appointment in: May 20 with Dr. Dietrich Pates

## 2011-07-11 NOTE — Assessment & Plan Note (Signed)
Blood pressure is well controlled at present. No changes at this time. 

## 2011-07-11 NOTE — Assessment & Plan Note (Signed)
She is advised to quit smoking.

## 2011-07-11 NOTE — Assessment & Plan Note (Signed)
She is unable to tolerate spironolactone, causing rash. I have added this medication to her allergy list. I will increase her potassium back to 40 mEq BID as she was taking prior to addition of spironolactone. She will have a BMET to evaluate her kidney and potassium status. She is advised again on low sodium diet and to avoid the fried salty foods. She is to continue to weigh daily. She is to call us for increased weight or shortness of breath.

## 2011-07-28 ENCOUNTER — Other Ambulatory Visit: Payer: Self-pay | Admitting: Adult Health

## 2011-07-28 LAB — BASIC METABOLIC PANEL
CO2: 30 mEq/L (ref 19–32)
Calcium: 9.7 mg/dL (ref 8.4–10.5)
Creat: 0.77 mg/dL (ref 0.50–1.10)
Glucose, Bld: 89 mg/dL (ref 70–99)
Sodium: 141 mEq/L (ref 135–145)

## 2011-08-01 ENCOUNTER — Ambulatory Visit (INDEPENDENT_AMBULATORY_CARE_PROVIDER_SITE_OTHER): Payer: Self-pay | Admitting: Cardiology

## 2011-08-01 ENCOUNTER — Encounter: Payer: Self-pay | Admitting: Cardiology

## 2011-08-01 VITALS — BP 105/70 | HR 68 | Resp 16 | Ht 67.0 in | Wt 277.0 lb

## 2011-08-01 DIAGNOSIS — I10 Essential (primary) hypertension: Secondary | ICD-10-CM

## 2011-08-01 DIAGNOSIS — F17200 Nicotine dependence, unspecified, uncomplicated: Secondary | ICD-10-CM

## 2011-08-01 DIAGNOSIS — Z720 Tobacco use: Secondary | ICD-10-CM

## 2011-08-01 DIAGNOSIS — I428 Other cardiomyopathies: Secondary | ICD-10-CM

## 2011-08-01 MED ORDER — QUINAPRIL HCL 40 MG PO TABS: 40.0000 mg | ORAL_TABLET | Freq: Every day | ORAL | Status: DC

## 2011-08-01 NOTE — Assessment & Plan Note (Addendum)
CHF is compensated at present.  Patient had apparent allergy to Spironolactone and cannot afford eplerenone.  She is otherwise tolerating beta blocker, ACE inhibitor and diuretic well.  Dose of quinapril will be increased to 40 mg per day.  Palpitations with right arm discomfort and malaise could reflect a significant arrhythmia.  Patient has an upcoming appointment with Dr. Ladona Ridgel for assessment and consideration for an implanted defibrillator.  Event recording would be desirable, but patient cannot afford this.  Serial Holter monitors will be obtained until a symptomatic spell occurs during recording.

## 2011-08-01 NOTE — Progress Notes (Deleted)
Name: Melanie Cordova    DOB: 19-Jan-1961  Age: 51 y.o.  MR#: [id number]       PCP:  [name], NP, NP      Insurance: @PAYORNAME @   CC:    Chief Complaint  Patient presents with  . Appointment    fluttering, right side of face and arm is numb    VS BP 105/70  Pulse 68  Resp 16  Ht 5\' 7"  (1.702 m)  Wt 277 lb (125.646 kg)  BMI 43.38 kg/m2  Weights Current Weight  08/01/11 277 lb (125.646 kg)  07/11/11 277 lb (125.646 kg)  07/07/11 275 lb (124.739 kg)    Blood Pressure  BP Readings from Last 3 Encounters:  08/01/11 105/70  07/11/11 105/73  07/08/11 104/66     Admit date:  (Not on file) Last encounter with RMR:  07/08/2011   Allergy Allergies  Allergen Reactions  . Penicillins Anaphylaxis and Shortness Of Breath    Hair loss  . Iodinated Diagnostic Agents Hives and Other (See Comments)    Pulmonary problems; no frank respiratory arrest  . Iodine   . Sulfa Antibiotics Other (See Comments)    Pt can't remember reaction  . Spironolactone Rash    Current Outpatient Prescriptions  Medication Sig Dispense Refill  . albuterol (PROVENTIL HFA) 108 (90 BASE) MCG/ACT inhaler Inhale 2 puffs into the lungs every 6 (six) hours as needed. For shortness of breath      . budesonide-formoterol (SYMBICORT) 80-4.5 MCG/ACT inhaler Inhale 2 puffs into the lungs 2 (two) times daily.      . carvedilol (COREG) 25 MG tablet Take 2 tablets twice daily  120 tablet  12  . desloratadine (CLARINEX) 5 MG tablet Take 5 mg by mouth daily.      . ferrous sulfate (IRON SUPPLEMENT) 325 (65 FE) MG tablet Take 325 mg by mouth daily with breakfast.      . fish oil-omega-3 fatty acids 1000 MG capsule Take 3 g by mouth 3 (three) times daily.        . furosemide (LASIX) 80 MG tablet Take 1 tablet (80 mg total) by mouth 2 (two) times daily. Call for increased weight  60 tablet  6  . gabapentin (NEURONTIN) 300 MG capsule Take 300 mg by mouth daily.      Marland Kitchen levothyroxine (SYNTHROID, LEVOTHROID) 300 MCG  tablet Take 300 mcg by mouth daily.        . Multiple Vitamin (MULTIVITAMIN) tablet Take 1 tablet by mouth daily.      . naproxen (NAPROSYN) 500 MG tablet Take 500 mg by mouth 2 (two) times daily with a meal.      . potassium chloride SA (K-DUR,KLOR-CON) 20 MEQ tablet Take 2 tablets (40 mEq total) by mouth 2 (two) times daily.  60 tablet  12  . quinapril (ACCUPRIL) 20 MG tablet Take 1 tablet (20 mg total) by mouth at bedtime.  30 tablet  0  . ranitidine (ZANTAC) 150 MG tablet Take 150 mg by mouth 2 (two) times daily as needed. For gastric reflux      . levothyroxine (SYNTHROID, LEVOTHROID) 112 MCG tablet 300 mcg daily.        Marland Kitchen DISCONTD: furosemide (LASIX) 40 MG tablet Take 2 tablets (80 mg total) by mouth 2 (two) times daily.  60 tablet  0  . DISCONTD: levothyroxine (SYNTHROID, LEVOTHROID) 112 MCG tablet Take 3 tablets (336 mcg total) by mouth daily.  90 tablet  0    Discontinued  Meds:    Medications Discontinued During This Encounter  Medication Reason  . Alpha-D-Galactosidase (BEANO PO) Error  . calcium citrate-vitamin D 200-200 MG-UNIT TABS Error    Patient Active Problem List  Diagnoses  . Morbid obesity  . Hypertension  . Cardiomyopathy  . Gastroesophageal reflux disease  . Hypothyroidism  . Tobacco abuse  . Hyperlipidemia  . Insomnia  . Hypokalemia    LABS Admission on 07/07/2011, Discharged on 07/08/2011  Component Date Value  . WBC 07/07/2011 8.0   . RBC 07/07/2011 4.44   . Hemoglobin 07/07/2011 13.4   . HCT 07/07/2011 41.0   . MCV 07/07/2011 92.3   . [facility] 07/07/2011 30.2   . MCHC 07/07/2011 32.7   . RDW 07/07/2011 13.3   . Platelets 07/07/2011 199   . Neutrophils Relative 07/07/2011 57   . Neutro Abs 07/07/2011 4.5   . Lymphocytes Relative 07/07/2011 30   . Lymphs Abs 07/07/2011 2.4   . Monocytes Relative 07/07/2011 8   . Monocytes Absolute 07/07/2011 0.7   . Eosinophils Relative 07/07/2011 4   . Eosinophils Absolute 07/07/2011 0.3   . Basophils Relative  07/07/2011 1   . Basophils Absolute 07/07/2011 0.0   . Sodium 07/07/2011 139   . Potassium 07/07/2011 3.7   . Chloride 07/07/2011 100   . CO2 07/07/2011 31   . Glucose, Bld 07/07/2011 104*  . BUN 07/07/2011 15   . Creatinine, Ser 07/07/2011 0.79   . Calcium 07/07/2011 9.3   . Total Protein 07/07/2011 6.9   . Albumin 07/07/2011 3.5   . AST 07/07/2011 15   . ALT 07/07/2011 21   . Alkaline Phosphatase 07/07/2011 104   . Total Bilirubin 07/07/2011 0.2*  . GFR calc non Af Amer 07/07/2011 >90   . GFR calc Af Amer 07/07/2011 >90   . Color, Urine 07/08/2011 YELLOW   . APPearance 07/08/2011 CLOUDY*  . Specific Gravity, Urine 07/08/2011 1.010   . pH 07/08/2011 5.5   . Glucose, UA 07/08/2011 NEGATIVE   . Hgb urine dipstick 07/08/2011 NEGATIVE   . Bilirubin Urine 07/08/2011 NEGATIVE   . Ketones, ur 07/08/2011 NEGATIVE   . Protein, ur 07/08/2011 NEGATIVE   . Urobilinogen, UA 07/08/2011 0.2   . Nitrite 07/08/2011 NEGATIVE   . Leukocytes, UA 07/08/2011 SMALL*  . D-Dimer, Quant 07/07/2011 0.31   . aPTT 07/07/2011 26   . Prothrombin Time 07/07/2011 12.5   . INR 07/07/2011 0.91   . Troponin i, poc 07/07/2011 0.01   . Comment 3 07/07/2011    . POC Troponin I 07/07/2011 0.01   . Squamous Epithelial / LPF 07/08/2011 MANY*  . WBC, UA 07/08/2011 3-6   . RBC / HPF 07/08/2011 0-2   . Bacteria, UA 07/08/2011 MANY*  Orders Only on 07/04/2011  Component Date Value  . Sodium 07/04/2011 138   . Potassium 07/04/2011 3.9   . Chloride 07/04/2011 100   . CO2 07/04/2011 30   . Glucose, Bld 07/04/2011 110*  . BUN 07/04/2011 14   . Creat 07/04/2011 0.73   . Calcium 07/04/2011 9.1   Orders Only on 06/27/2011  Component Date Value  . Sodium 06/27/2011 138   . Potassium 06/27/2011 4.1   . Chloride 06/27/2011 103   . CO2 06/27/2011 28   . Glucose, Bld 06/27/2011 108*  . BUN 06/27/2011 14   . Creat 06/27/2011 0.63   . Calcium 06/27/2011 9.3      Results for this Opt Visit:     Results for  orders placed during the hospital encounter of 07/07/11  CBC      Component Value Range   WBC 8.0  4.0 - 10.5 (K/uL)   RBC 4.44  3.87 - 5.11 (MIL/uL)   Hemoglobin 13.4  12.0 - 15.0 (g/dL)   HCT 47.8  29.5 - 62.1 (%)   MCV 92.3  78.0 - 100.0 (fL)   MCH 30.2  26.0 - 34.0 (pg)   MCHC 32.7  30.0 - 36.0 (g/dL)   RDW 30.8  65.7 - 84.6 (%)   Platelets 199  150 - 400 (K/uL)  DIFFERENTIAL      Component Value Range   Neutrophils Relative 57  43 - 77 (%)   Neutro Abs 4.5  1.7 - 7.7 (K/uL)   Lymphocytes Relative 30  12 - 46 (%)   Lymphs Abs 2.4  0.7 - 4.0 (K/uL)   Monocytes Relative 8  3 - 12 (%)   Monocytes Absolute 0.7  0.1 - 1.0 (K/uL)   Eosinophils Relative 4  0 - 5 (%)   Eosinophils Absolute 0.3  0.0 - 0.7 (K/uL)   Basophils Relative 1  0 - 1 (%)   Basophils Absolute 0.0  0.0 - 0.1 (K/uL)  COMPREHENSIVE METABOLIC PANEL      Component Value Range   Sodium 139  135 - 145 (mEq/L)   Potassium 3.7  3.5 - 5.1 (mEq/L)   Chloride 100  96 - 112 (mEq/L)   CO2 31  19 - 32 (mEq/L)   Glucose, Bld 104 (*) 70 - 99 (mg/dL)   BUN 15  6 - 23 (mg/dL)   Creatinine, Ser 9.62  0.50 - 1.10 (mg/dL)   Calcium 9.3  8.4 - 95.2 (mg/dL)   Total Protein 6.9  6.0 - 8.3 (g/dL)   Albumin 3.5  3.5 - 5.2 (g/dL)   AST 15  0 - 37 (U/L)   ALT 21  0 - 35 (U/L)   Alkaline Phosphatase 104  39 - 117 (U/L)   Total Bilirubin 0.2 (*) 0.3 - 1.2 (mg/dL)   GFR calc non Af Amer >90  >90 (mL/min)   GFR calc Af Amer >90  >90 (mL/min)  URINALYSIS, ROUTINE W REFLEX MICROSCOPIC      Component Value Range   Color, Urine YELLOW  YELLOW    APPearance CLOUDY (*) CLEAR    Specific Gravity, Urine 1.010  1.005 - 1.030    pH 5.5  5.0 - 8.0    Glucose, UA NEGATIVE  NEGATIVE (mg/dL)   Hgb urine dipstick NEGATIVE  NEGATIVE    Bilirubin Urine NEGATIVE  NEGATIVE    Ketones, ur NEGATIVE  NEGATIVE (mg/dL)   Protein, ur NEGATIVE  NEGATIVE (mg/dL)   Urobilinogen, UA 0.2  0.0 - 1.0 (mg/dL)   Nitrite NEGATIVE  NEGATIVE    Leukocytes, UA  SMALL (*) NEGATIVE   D-DIMER, QUANTITATIVE      Component Value Range   D-Dimer, Quant 0.31  0.00 - 0.48 (ug/mL-FEU)  APTT      Component Value Range   aPTT 26  24 - 37 (seconds)  PROTIME-INR      Component Value Range   Prothrombin Time 12.5  11.6 - 15.2 (seconds)   INR 0.91  0.00 - 1.49   POCT I-STAT TROPONIN I      Component Value Range   Troponin i, poc 0.01  0.00 - 0.08 (ng/mL)   Comment 3       POC Troponin I 0.01    URINE  MICROSCOPIC-ADD ON      Component Value Range   Squamous Epithelial / LPF MANY (*) RARE    WBC, UA 3-6  <3 (WBC/hpf)   RBC / HPF 0-2  <3 (RBC/hpf)   Bacteria, UA MANY (*) RARE     EKG Orders placed during the hospital encounter of 07/07/11  . ED EKG  . ED EKG  . EKG 12-LEAD  . EKG 12-LEAD  . EKG     Prior Assessment and Plan Problem List as of 08/01/2011          Cardiology Problems   Hypertension   Last Assessment & Plan Note   07/11/2011 Office Visit Signed 07/11/2011  4:44 PM by Jodelle Gross, NP    Blood pressure is well controlled at present. No changes at this time.    Cardiomyopathy   Last Assessment & Plan Note   07/11/2011 Office Visit Signed 07/11/2011  4:42 PM by Jodelle Gross, NP    She is unable to tolerate spironolactone, causing rash. I have added this medication to her allergy list. I will increase her potassium back to 40 mEq BID as she was taking prior to addition of spironolactone. She will have a BMET to evaluate her kidney and potassium status. She is advised again on low sodium diet and to avoid the fried salty foods. She is to continue to weigh daily. She is to call us for increased weight or shortness of breath.    Hyperlipidemia   Last Assessment & Plan Note   03/16/2011 Office Visit Signed 03/16/2011 11:28 AM by Kathlen Brunswick, MD    Patient had very low lipid values in the past when treated with rosuvastatin.  She is not currently taking any lipid lowering therapy except fish oil.  We will recheck a lipid  profile.      Other   Morbid obesity   Last Assessment & Plan Note   03/16/2011 Office Visit Signed 03/16/2011 11:26 AM by Kathlen Brunswick, MD    Patient is congratulated on weight loss to date and encouraged to continue to restrict caloric intake in the context of a low salt diet.    Gastroesophageal reflux disease   Hypothyroidism   Last Assessment & Plan Note   03/16/2011 Office Visit Addendum 03/20/2011 11:58 AM by Kathlen Brunswick, MD    Hypothyroidism was reassessed with a normal TSH in 12/2010.      Tobacco abuse   Last Assessment & Plan Note   07/11/2011 Office Visit Signed 07/11/2011  4:43 PM by Jodelle Gross, NP    She is advised to quit smoking.    Insomnia   Hypokalemia   Last Assessment & Plan Note   06/27/2011 Office Visit Signed 07/03/2011 11:01 AM by Kathlen Brunswick, MD    Potassium supplement will be decreased in the face of initiating therapy with spironolactone.        Imaging: Dg Chest 2 View  07/07/2011  *RADIOLOGY REPORT*  Clinical Data: Palpitations  CHEST - 2 VIEW  Comparison: 04/20/2011  Findings: Mild cardiomegaly noted.  No evidence for edema or focal pulmonary opacity.  No pleural effusion.  Cardiac leads obscure detail.  No acute osseous finding.  IMPRESSION: Cardiomegaly without focal acute finding.  Original Report Authenticated By: Harrel Lemon, M.D.     [facility] Calculation: Score not calculated. Missing: Total Cholesterol

## 2011-08-01 NOTE — Patient Instructions (Addendum)
Your physician recommends that you schedule a follow-up appointment in: 3 weeks   Your physician has recommended that you wear a holter monitor. Holter monitors are medical devices that record the heart's electrical activity. Doctors most often use these monitors to diagnose arrhythmias. Arrhythmias are problems with the speed or rhythm of the heartbeat. The monitor is a small, portable device. You can wear one while you do your normal daily activities. This is usually used to diagnose what is causing palpitations/syncope (passing out).   - You will wear for 48 hours. If no symptoms during monitoring, you will wear for an additional 48 hours.  Your physician has recommended you make the following change in your medication:  INCREASE Quinapril to 40 mg daily  STOP Smoking

## 2011-08-01 NOTE — Assessment & Plan Note (Signed)
Patient is congratulated on not gaining weight while she was attempting to discontinue tobacco use.

## 2011-08-01 NOTE — Assessment & Plan Note (Signed)
Complete cessation of smoking advised.

## 2011-08-01 NOTE — Progress Notes (Signed)
Patient ID: Melanie Cordova, female   DOB: Nov 15, 1960, 51 y.o.   MRN: [id number]  HPI: Scheduled return visit for this very nice woman with cardiomyopathy, presumed nonischemic.  Since her last visit, she has done fairly well.  Dyspnea has improved, which she attributes to treatment with metered dose inhalers.  She has had some discomfort in her right arm radiating to the right neck associated with palpitations but no lightheadedness nor syncope.  She is scheduled to be seen by Dr. Ladona Ridgel for consideration of an AICD implant.  That hospitalization would be a good opportunity to perform coronary angiography.  She continues to smoke cigarettes, but has reduced consumption to 7 cigarettes per day.  Prior to Admission medications   Medication Sig Start Date End Date Taking? Authorizing Provider  albuterol (PROVENTIL HFA) 108 (90 BASE) MCG/ACT inhaler Inhale 2 puffs into the lungs every 6 (six) hours as needed. For shortness of breath   Yes Historical Provider, MD  budesonide-formoterol (SYMBICORT) 80-4.5 MCG/ACT inhaler Inhale 2 puffs into the lungs 2 (two) times daily.   Yes Historical Provider, MD  carvedilol (COREG) 25 MG tablet Take 2 tablets twice daily 01/14/11  Yes Kathlen Brunswick, MD  desloratadine (CLARINEX) 5 MG tablet Take 5 mg by mouth daily.   Yes Historical Provider, MD  ferrous sulfate (IRON SUPPLEMENT) 325 (65 FE) MG tablet Take 325 mg by mouth daily with breakfast.   Yes Historical Provider, MD  fish oil-omega-3 fatty acids 1000 MG capsule Take 3 g by mouth 3 (three) times daily.     Yes Historical Provider, MD  furosemide (LASIX) 80 MG tablet Take 1 tablet (80 mg total) by mouth 2 (two) times daily. Call for increased weight 05/24/11 05/23/12 Yes Kathlen Brunswick, MD  gabapentin (NEURONTIN) 300 MG capsule Take 300 mg by mouth daily.   Yes Historical Provider, MD  levothyroxine (SYNTHROID, LEVOTHROID) 300 MCG tablet Take 300 mcg by mouth daily.     Yes Historical Provider, MD  Multiple  Vitamin (MULTIVITAMIN) tablet Take 1 tablet by mouth daily.   Yes Historical Provider, MD  naproxen (NAPROSYN) 500 MG tablet Take 500 mg by mouth 2 (two) times daily with a meal.   Yes Historical Provider, MD  potassium chloride SA (K-DUR,KLOR-CON) 20 MEQ tablet Take 2 tablets (40 mEq total) by mouth 2 (two) times daily. 07/11/11 07/10/12 Yes Jodelle Gross, NP  quinapril (ACCUPRIL) 20 MG tablet Take 1 tablet (20 mg total) by mouth at bedtime. 04/22/11 04/21/12 Yes Nimish Normajean Glasgow, MD  ranitidine (ZANTAC) 150 MG tablet Take 150 mg by mouth 2 (two) times daily as needed. For gastric reflux   Yes Historical Provider, MD  levothyroxine (SYNTHROID, LEVOTHROID) 112 MCG tablet 300 mcg daily.   11/03/10 11/17/10  Wilson Singer, MD   Allergies  Allergen Reactions  . Penicillins Anaphylaxis and Shortness Of Breath    Hair loss  . Iodinated Diagnostic Agents Hives and Other (See Comments)    Pulmonary problems; no frank respiratory arrest  . Iodine   . Sulfa Antibiotics Other (See Comments)    Pt can't remember reaction  . Spironolactone Rash   Past medical history, social history, and family history reviewed and updated.  ROS: Denies orthopnea, PND, wheezing, pedal edema.  All other systems reviewed and are negative.  PHYSICAL EXAM: BP 105/70  Pulse 68  Resp 16  Ht 5\' 7"  (1.702 m)  Wt 125.646 kg (277 lb)  BMI 43.38 kg/m2  General-Well-developed; no acute distress Body  habitus-Obese Neck-No JVD; no carotid bruits Lungs-clear lung fields; resonant to percussion Cardiovascular-normal PMI; normal S1 and S2; no third nor fourth heart sound Abdomen-normal bowel sounds; soft and non-tender without masses or organomegaly Musculoskeletal-No deformities, no cyanosis or clubbing Neurologic-Normal cranial nerves; symmetric strength and tone Skin-Warm, no significant lesions Extremities-distal pulses intact; 1/2+ ankle edema  ASSESSMENT AND PLAN:  Lamb Bing, MD 08/01/2011 2:11 PM

## 2011-08-03 DIAGNOSIS — R002 Palpitations: Secondary | ICD-10-CM

## 2011-08-03 NOTE — Progress Notes (Signed)
48 hr Holter Monitor has been performed.  Erskine Speed RN

## 2011-08-04 ENCOUNTER — Ambulatory Visit (HOSPITAL_COMMUNITY)
Admission: RE | Admit: 2011-08-04 | Discharge: 2011-08-04 | Disposition: A | Discharge status: Home / Self Care | Payer: Self-pay | Source: Ambulatory Visit | Attending: Cardiology | Admitting: Cardiology

## 2011-08-04 DIAGNOSIS — I10 Essential (primary) hypertension: Secondary | ICD-10-CM

## 2011-08-06 ENCOUNTER — Encounter: Payer: Self-pay | Admitting: Cardiology

## 2011-08-14 ENCOUNTER — Emergency Department (HOSPITAL_COMMUNITY)
Admission: EM | Admit: 2011-08-14 | Discharge: 2011-08-14 | Disposition: A | Discharge status: Home / Self Care | Payer: Self-pay | Attending: Emergency Medicine | Admitting: Emergency Medicine

## 2011-08-14 ENCOUNTER — Encounter (HOSPITAL_COMMUNITY): Payer: Self-pay | Admitting: Emergency Medicine

## 2011-08-14 DIAGNOSIS — M79604 Pain in right leg: Secondary | ICD-10-CM

## 2011-08-14 DIAGNOSIS — Z8739 Personal history of other diseases of the musculoskeletal system and connective tissue: Secondary | ICD-10-CM | POA: Insufficient documentation

## 2011-08-14 DIAGNOSIS — I10 Essential (primary) hypertension: Secondary | ICD-10-CM | POA: Insufficient documentation

## 2011-08-14 DIAGNOSIS — M79609 Pain in unspecified limb: Secondary | ICD-10-CM | POA: Insufficient documentation

## 2011-08-14 DIAGNOSIS — Z79899 Other long term (current) drug therapy: Secondary | ICD-10-CM | POA: Insufficient documentation

## 2011-08-14 DIAGNOSIS — K219 Gastro-esophageal reflux disease without esophagitis: Secondary | ICD-10-CM | POA: Insufficient documentation

## 2011-08-14 DIAGNOSIS — E039 Hypothyroidism, unspecified: Secondary | ICD-10-CM | POA: Insufficient documentation

## 2011-08-14 DIAGNOSIS — R079 Chest pain, unspecified: Secondary | ICD-10-CM | POA: Insufficient documentation

## 2011-08-14 DIAGNOSIS — E785 Hyperlipidemia, unspecified: Secondary | ICD-10-CM | POA: Insufficient documentation

## 2011-08-14 DIAGNOSIS — I509 Heart failure, unspecified: Secondary | ICD-10-CM | POA: Insufficient documentation

## 2011-08-14 DIAGNOSIS — J45909 Unspecified asthma, uncomplicated: Secondary | ICD-10-CM | POA: Insufficient documentation

## 2011-08-14 LAB — CBC
Hemoglobin: 13 g/dL (ref 12.0–15.0)
MCHC: 33.6 g/dL (ref 30.0–36.0)
RDW: 13.6 % (ref 11.5–15.5)
WBC: 9.3 10*3/uL (ref 4.0–10.5)

## 2011-08-14 LAB — DIFFERENTIAL
Basophils Absolute: 0 10*3/uL (ref 0.0–0.1)
Basophils Relative: 0 % (ref 0–1)
Lymphocytes Relative: 28 % (ref 12–46)
Neutro Abs: 5.5 10*3/uL (ref 1.7–7.7)
Neutrophils Relative %: 59 % (ref 43–77)

## 2011-08-14 LAB — CARDIAC PANEL(CRET KIN+CKTOT+MB+TROPI)
CK, MB: 1.5 ng/mL (ref 0.3–4.0)
Relative Index: INVALID (ref 0.0–2.5)
Total CK: 46 U/L (ref 7–177)
Troponin I: <0.3 ng/mL (ref <0.30)

## 2011-08-14 LAB — BASIC METABOLIC PANEL
CO2: 27 mEq/L (ref 19–32)
Chloride: 102 mEq/L (ref 96–112)
GFR calc Af Amer: >90 mL/min (ref >90)
Potassium: 3.7 mEq/L (ref 3.5–5.1)
Sodium: 138 mEq/L (ref 135–145)

## 2011-08-14 NOTE — Discharge Instructions (Signed)
Tests were normal. Followup your Dr.

## 2011-08-14 NOTE — Procedures (Signed)
Melanie Cordova, Melanie Cordova            ACCOUNT NO.:  [id number]  MEDICAL RECORD NO.:  [id number]  LOCATION:  CARDIOPU                      FACILITY:  [facility]  PHYSICIAN:  Gerrit Friends. Dietrich Pates, MD, FACCDATE OF BIRTH:  12/16/1960  DATE OF PROCEDURE: DATE OF DISCHARGE:  08/04/2011                               HOLTER MONITOR   CLINICAL DATA:  A 51 year old woman with palpitations.  1. Continuous electrocardiographic recording was maintained for 48     hours, during which time, the predominant rhythm was normal sinus     with minimal sinus tachycardia to a peak heart rate of 102 bpm. 2. Monomorphic premature ventricular depolarizations were recorded at     an average incidence of 35 per hour with approximately 1 PVC pair     per hour, and a single 3-beat run of ventricular tachycardia.  PVCs     were relatively frequent at times and virtually absent during other     portions of the recording interval. 3. Less frequent supraventricular ectopics occurred with an average     rate of 18 per hour. 4. No increase in baseline ST-segment depression was identified     throughout the tracing. 5. A detailed diary of activity was returned identifying eight     symptomatic episodes.  During one in which palpitations, dizziness     and chest discomfort occurred as well as two others with dizziness     and chest discomfort, and one with dyspnea on exertion, frequent     PVCs were noted with some PVC pairs.  During four episodes that     included chest discomfort only, PVCs were less frequent.  IMPRESSION:  Abnormal continuous electrocardiographic recording with a relatively narrow range of heart rates, not infrequent supraventricular and ventricular ectopy, no worrisome arrhythmias, and a probable correlation between symptoms and premature ventricular contraction frequency.  Other findings as noted.     Gerrit Friends. Dietrich Pates, MD, [facility]     RMR/MEDQ  D:  08/13/2011  T:  08/14/2011  Job:  [id number]

## 2011-08-14 NOTE — ED Notes (Signed)
Patient reports chest pain starting at approximately 3 a.m., states pain is dull. Also states had sharp pain going up right leg. Also states that right shoulder started hurting and radiates down to hand.

## 2011-08-14 NOTE — ED Notes (Signed)
Patient also states has been under a lot of stress recently, mother passed away a month ago.

## 2011-08-14 NOTE — ED Provider Notes (Signed)
History     CSN: [id number]  Arrival date & time 08/14/11  0406   First MD Initiated Contact with Patient 08/14/11 [phone]      Chief Complaint  Patient presents with  . Chest Pain  . Leg Pain    (Consider location/radiation/quality/duration/timing/severity/associated sxs/prior treatment) HPI....  leg cramping at approximately 3 AM.  Also complains of transient chest pain. No shortness of breath, dyspnea, diaphoresis. She thinks she had a leg cramp. Symptoms are minimal to moderate. Gone now.  Past Medical History  Diagnosis Date  . Hypertension     09/2010-normal CMet and CBC; Lipid profile-116, 88, 25, 73  . Cardiomyopathy 08/2010    Presented with congestive heart failure; EF of 15% and 2012; hypotension on medication precludes optimal dosing  . Gastroesophageal reflux disease   . Hypothyroidism     Recent TSH was normal.  . Pneumonia   . Obesity   . Hyperlipidemia   . Tobacco abuse     20 pack years  . CHF (congestive heart failure)   . Asthma   . Shortness of breath   . Seizures   . Headache   . Arthritis   . Anxiety   . Neuropathy     Past Surgical History  Procedure Date  . Cesarean section     X2    Family History  Problem Relation Age of Onset  . Cardiomyopathy Mother     ICD pacemaker-ischmic CM  . Heart failure Mother   . Hypertension Mother   . Cardiomyopathy Father     Deceased  . Coronary artery disease Father   . Heart failure Father   . Heart failure Brother   . Hypertension Brother     History  Substance Use Topics  . Smoking status: Current Everyday Smoker -- 0.5 packs/day for 20 years    Types: Cigarettes    Last Attempt to Quit: 09/03/2010  . Smokeless tobacco: Not on file  . Alcohol Use: No    OB History    Grav Para Term Preterm Abortions TAB SAB Ect Mult Living                  Review of Systems  All other systems reviewed and are negative.    Allergies  Penicillins; Iodinated diagnostic agents; Iodine; Sulfa  antibiotics; and Spironolactone  Home Medications   Current Outpatient Rx  Name Route Sig Dispense Refill  . ALBUTEROL SULFATE HFA 108 (90 BASE) MCG/ACT IN AERS Inhalation Inhale 2 puffs into the lungs every 6 (six) hours as needed. For shortness of breath    . BUDESONIDE-FORMOTEROL FUMARATE 80-4.5 MCG/ACT IN AERO Inhalation Inhale 2 puffs into the lungs 2 (two) times daily.    Marland Kitchen CARVEDILOL 25 MG PO TABS  Take 2 tablets twice daily 120 tablet 12  . DESLORATADINE 5 MG PO TABS Oral Take 5 mg by mouth daily.    Marland Kitchen FERROUS SULFATE 325 (65 FE) MG PO TABS Oral Take 325 mg by mouth daily with breakfast.    . OMEGA-3 FATTY ACIDS 1000 MG PO CAPS Oral Take 3 g by mouth 3 (three) times daily.      . FUROSEMIDE 80 MG PO TABS Oral Take 1 tablet (80 mg total) by mouth 2 (two) times daily. Call for increased weight 60 tablet 6  . LEVOTHYROXINE SODIUM 300 MCG PO TABS Oral Take 300 mcg by mouth daily.      Marland Kitchen ONE-DAILY MULTI VITAMINS PO TABS Oral Take 1 tablet by mouth  daily.    Marland Kitchen NAPROXEN 500 MG PO TABS Oral Take 500 mg by mouth 2 (two) times daily with a meal.    . POTASSIUM CHLORIDE CRYS ER 20 MEQ PO TBCR Oral Take 2 tablets (40 mEq total) by mouth 2 (two) times daily. 60 tablet 12  . QUINAPRIL HCL 40 MG PO TABS Oral Take 1 tablet (40 mg total) by mouth at bedtime. 30 tablet 12  . RANITIDINE HCL 150 MG PO TABS Oral Take 150 mg by mouth 2 (two) times daily as needed. For gastric reflux    . GABAPENTIN 300 MG PO CAPS Oral Take 300 mg by mouth daily.    Marland Kitchen LEVOTHYROXINE SODIUM 112 MCG PO TABS  300 mcg daily.        BP 106/58  Pulse 67  Temp(Src) 98 F (36.7 C) (Oral)  Resp 12  Ht 5' 7.5" (1.715 m)  Wt 277 lb (125.646 kg)  BMI 42.74 kg/m2  SpO2 97%  Physical Exam  Nursing note and vitals reviewed. Constitutional: She is oriented to person, place, and time. She appears well-developed and well-nourished.  HENT:  Head: Normocephalic and atraumatic.  Eyes: Conjunctivae and EOM are normal. Pupils are  equal, round, and reactive to light.  Neck: Normal range of motion. Neck supple.  Cardiovascular: Normal rate and regular rhythm.   Pulmonary/Chest: Effort normal and breath sounds normal.  Abdominal: Soft. Bowel sounds are normal.  Musculoskeletal: Normal range of motion.  Neurological: She is alert and oriented to person, place, and time.  Skin: Skin is warm and dry.  Psychiatric: She has a normal mood and affect.    ED Course  Procedures (including critical care time)   Labs Reviewed  CBC  DIFFERENTIAL  BASIC METABOLIC PANEL  CARDIAC PANEL(CRET KIN+CKTOT+MB+TROPI)   No results found.   No diagnosis found.   Date: 08/14/2011  Rate: 68  Rhythm: normal sinus rhythm  QRS Axis: normal  Intervals: QT prolonged  ST/T Wave abnormalities: nonspecific T wave changes  Conduction Disutrbances:none  Narrative Interpretation:   Old EKG Reviewed: changes noted TWI laterally  MDM  History more suggestive of a leg cramp. Physical exam normal. EKG cardiac enzymes and electrolytes all normal        Donnetta Hutching, MD 08/14/11 [phone]

## 2011-08-23 ENCOUNTER — Encounter: Payer: Self-pay | Admitting: *Deleted

## 2011-08-23 ENCOUNTER — Encounter: Payer: Self-pay | Admitting: Cardiology

## 2011-08-23 ENCOUNTER — Ambulatory Visit (INDEPENDENT_AMBULATORY_CARE_PROVIDER_SITE_OTHER): Payer: Self-pay | Admitting: Cardiology

## 2011-08-23 VITALS — BP 139/58 | HR 51 | Ht 67.0 in | Wt 278.0 lb

## 2011-08-23 DIAGNOSIS — I428 Other cardiomyopathies: Secondary | ICD-10-CM

## 2011-08-23 DIAGNOSIS — I10 Essential (primary) hypertension: Secondary | ICD-10-CM

## 2011-08-23 DIAGNOSIS — E782 Mixed hyperlipidemia: Secondary | ICD-10-CM

## 2011-08-23 DIAGNOSIS — E785 Hyperlipidemia, unspecified: Secondary | ICD-10-CM

## 2011-08-23 DIAGNOSIS — I429 Cardiomyopathy, unspecified: Secondary | ICD-10-CM

## 2011-08-23 NOTE — Assessment & Plan Note (Signed)
Patient was previously treated with a statin, but this is no longer included in her medical regime.  Lipid profile will be repeated off drugs.

## 2011-08-23 NOTE — Progress Notes (Signed)
Patient ID: Melanie Cordova, female   DOB: 1960-03-30, 51 y.o.   MRN: [id number]  HPI: Scheduled return visit for this very nice woman with cardiomyopathy diagnosed 6 months ago.  2 echocardiograms performed 5 months apart verify continuing severe left ventricular dysfunction with an ejection fraction of approximately 20%.  She has recently complained of palpitations.  Holter monitoring revealed PVCs, bigeminy, and one 3 beat run of ventricular tachycardia.  She continues to work full-time without significant difficulty.  She denies dyspnea and chest discomfort.  Prior to Admission medications   Medication Sig Start Date End Date Taking? Authorizing Provider  albuterol (PROVENTIL HFA) 108 (90 BASE) MCG/ACT inhaler Inhale 2 puffs into the lungs every 6 (six) hours as needed. For shortness of breath   Yes Historical Provider, MD  budesonide-formoterol (SYMBICORT) 80-4.5 MCG/ACT inhaler Inhale 2 puffs into the lungs 2 (two) times daily.   Yes Historical Provider, MD  carvedilol (COREG) 25 MG tablet Take 2 tablets twice daily 01/14/11  Yes Kathlen Brunswick, MD  desloratadine (CLARINEX) 5 MG tablet Take 5 mg by mouth daily.   Yes Historical Provider, MD  ferrous sulfate (IRON SUPPLEMENT) 325 (65 FE) MG tablet Take 325 mg by mouth daily with breakfast.   Yes Historical Provider, MD  fish oil-omega-3 fatty acids 1000 MG capsule Take 3 g by mouth 3 (three) times daily.     Yes Historical Provider, MD  furosemide (LASIX) 80 MG tablet Take 1 tablet (80 mg total) by mouth 2 (two) times daily. Call for increased weight 05/24/11 05/23/12 Yes Kathlen Brunswick, MD  gabapentin (NEURONTIN) 300 MG capsule Take 300 mg by mouth daily.   Yes Historical Provider, MD  levothyroxine (SYNTHROID, LEVOTHROID) 300 MCG tablet Take 300 mcg by mouth daily.     Yes Historical Provider, MD  Multiple Vitamin (MULTIVITAMIN) tablet Take 1 tablet by mouth daily.   Yes Historical Provider, MD  naproxen (NAPROSYN) 500 MG tablet Take 500  mg by mouth 2 (two) times daily with a meal.   Yes Historical Provider, MD  potassium chloride SA (K-DUR,KLOR-CON) 20 MEQ tablet Take 2 tablets (40 mEq total) by mouth 2 (two) times daily. 07/11/11 07/10/12 Yes Jodelle Gross, NP  quinapril (ACCUPRIL) 40 MG tablet Take 1 tablet (40 mg total) by mouth at bedtime. 08/01/11 07/31/12 Yes Kathlen Brunswick, MD  ranitidine (ZANTAC) 150 MG tablet Take 150 mg by mouth 2 (two) times daily as needed. For gastric reflux   Yes Historical Provider, MD  levothyroxine (SYNTHROID, LEVOTHROID) 112 MCG tablet 300 mcg daily.   11/03/10 11/17/10  Wilson Singer, MD   Allergies  Allergen Reactions  . Penicillins Anaphylaxis and Shortness Of Breath    Hair loss  . Iodinated Diagnostic Agents Hives and Other (See Comments)    Pulmonary problems; no frank respiratory arrest  . Iodine   . Sulfa Antibiotics Other (See Comments)    Pt can't remember reaction  . Spironolactone Rash     Past medical history, social history, and family history reviewed and updated.  ROS: Denies orthopnea, PND, cough, lightheadedness, syncope or pedal edema.  PHYSICAL EXAM: BP 139/58  Pulse 51  Ht 5\' 7"  (1.702 m)  Wt 126.1 kg (278 lb)  BMI 43.54 kg/m2  SpO2 98%  General-Well developed; no acute distress Body habitus-Mildly obese Neck-No JVD; no carotid bruits Lungs-clear lung fields; resonant to percussion Cardiovascular-normal PMI; normal S1 and S2 Abdomen-normal bowel sounds; soft and non-tender without masses or organomegaly Musculoskeletal-No deformities, no  cyanosis or clubbing Neurologic-Normal cranial nerves; symmetric strength and tone Skin-Warm, no significant lesions Extremities-distal pulses intact; 1/2+ ankle edema  ASSESSMENT AND PLAN:  [place] Bing, MD 08/23/2011 4:34 PM

## 2011-08-23 NOTE — Progress Notes (Deleted)
Name: Melanie Cordova    DOB: 05-Mar-1961  Age: 51 y.o.  MR#: [id number]       PCP:  [name], NP, NP      Insurance: @PAYORNAME @   CC:    Chief Complaint  Patient presents with  . None    3 week f/u - Med bottles/TC    VS BP 139/58  Pulse 51  Ht 5\' 7"  (1.702 m)  Wt 278 lb (126.1 kg)  BMI 43.54 kg/m2  SpO2 98%  Weights Current Weight  08/23/11 278 lb (126.1 kg)  08/14/11 277 lb (125.646 kg)  08/01/11 277 lb (125.646 kg)    Blood Pressure  BP Readings from Last 3 Encounters:  08/23/11 139/58  08/14/11 124/70  08/01/11 105/70     Admit date:  (Not on file) Last encounter with RMR:  08/06/2011   Allergy Allergies  Allergen Reactions  . Penicillins Anaphylaxis and Shortness Of Breath    Hair loss  . Iodinated Diagnostic Agents Hives and Other (See Comments)    Pulmonary problems; no frank respiratory arrest  . Iodine   . Sulfa Antibiotics Other (See Comments)    Pt can't remember reaction  . Spironolactone Rash    Current Outpatient Prescriptions  Medication Sig Dispense Refill  . albuterol (PROVENTIL HFA) 108 (90 BASE) MCG/ACT inhaler Inhale 2 puffs into the lungs every 6 (six) hours as needed. For shortness of breath      . budesonide-formoterol (SYMBICORT) 80-4.5 MCG/ACT inhaler Inhale 2 puffs into the lungs 2 (two) times daily.      . carvedilol (COREG) 25 MG tablet Take 2 tablets twice daily  120 tablet  12  . desloratadine (CLARINEX) 5 MG tablet Take 5 mg by mouth daily.      . ferrous sulfate (IRON SUPPLEMENT) 325 (65 FE) MG tablet Take 325 mg by mouth daily with breakfast.      . fish oil-omega-3 fatty acids 1000 MG capsule Take 3 g by mouth 3 (three) times daily.        . furosemide (LASIX) 80 MG tablet Take 1 tablet (80 mg total) by mouth 2 (two) times daily. Call for increased weight  60 tablet  6  . gabapentin (NEURONTIN) 300 MG capsule Take 300 mg by mouth daily.      Marland Kitchen levothyroxine (SYNTHROID, LEVOTHROID) 300 MCG tablet Take 300 mcg by mouth  daily.        . Multiple Vitamin (MULTIVITAMIN) tablet Take 1 tablet by mouth daily.      . naproxen (NAPROSYN) 500 MG tablet Take 500 mg by mouth 2 (two) times daily with a meal.      . potassium chloride SA (K-DUR,KLOR-CON) 20 MEQ tablet Take 2 tablets (40 mEq total) by mouth 2 (two) times daily.  60 tablet  12  . quinapril (ACCUPRIL) 40 MG tablet Take 1 tablet (40 mg total) by mouth at bedtime.  30 tablet  12  . ranitidine (ZANTAC) 150 MG tablet Take 150 mg by mouth 2 (two) times daily as needed. For gastric reflux      . levothyroxine (SYNTHROID, LEVOTHROID) 112 MCG tablet 300 mcg daily.        Marland Kitchen DISCONTD: furosemide (LASIX) 40 MG tablet Take 2 tablets (80 mg total) by mouth 2 (two) times daily.  60 tablet  0  . DISCONTD: levothyroxine (SYNTHROID, LEVOTHROID) 112 MCG tablet Take 3 tablets (336 mcg total) by mouth daily.  90 tablet  0    Discontinued Meds:  There are no discontinued medications.  Patient Active Problem List  Diagnoses  . Morbid obesity  . Hypertension  . Cardiomyopathy  . Gastroesophageal reflux disease  . Hypothyroidism  . Tobacco abuse  . Hyperlipidemia  . Insomnia  . Hypokalemia    LABS Admission on 08/14/2011, Discharged on 08/14/2011  Component Date Value  . WBC 08/14/2011 9.3   . RBC 08/14/2011 4.20   . Hemoglobin 08/14/2011 13.0   . HCT 08/14/2011 38.7   . MCV 08/14/2011 92.1   . [facility] 08/14/2011 31.0   . MCHC 08/14/2011 33.6   . RDW 08/14/2011 13.6   . Platelets 08/14/2011 220   . Neutrophils Relative 08/14/2011 59   . Neutro Abs 08/14/2011 5.5   . Lymphocytes Relative 08/14/2011 28   . Lymphs Abs 08/14/2011 2.6   . Monocytes Relative 08/14/2011 9   . Monocytes Absolute 08/14/2011 0.8   . Eosinophils Relative 08/14/2011 4   . Eosinophils Absolute 08/14/2011 0.3   . Basophils Relative 08/14/2011 0   . Basophils Absolute 08/14/2011 0.0   . Sodium 08/14/2011 138   . Potassium 08/14/2011 3.7   . Chloride 08/14/2011 102   . CO2 08/14/2011 27     . Glucose, Bld 08/14/2011 97   . BUN 08/14/2011 15   . Creatinine, Ser 08/14/2011 0.71   . Calcium 08/14/2011 9.1   . GFR calc non Af Amer 08/14/2011 >90   . GFR calc Af Amer 08/14/2011 >90   . Total CK 08/14/2011 46   . CK, MB 08/14/2011 1.5   . Troponin I 08/14/2011 <0.30   . Relative Index 08/14/2011 RELATIVE INDEX IS INVALID   Orders Only on 07/28/2011  Component Date Value  . Sodium 07/28/2011 141   . Potassium 07/28/2011 4.1   . Chloride 07/28/2011 101   . CO2 07/28/2011 30   . Glucose, Bld 07/28/2011 89   . BUN 07/28/2011 15   . Creat 07/28/2011 0.77   . Calcium 07/28/2011 9.7   Admission on 07/07/2011, Discharged on 07/08/2011  Component Date Value  . WBC 07/07/2011 8.0   . RBC 07/07/2011 4.44   . Hemoglobin 07/07/2011 13.4   . HCT 07/07/2011 41.0   . MCV 07/07/2011 92.3   . [facility] 07/07/2011 30.2   . MCHC 07/07/2011 32.7   . RDW 07/07/2011 13.3   . Platelets 07/07/2011 199   . Neutrophils Relative 07/07/2011 57   . Neutro Abs 07/07/2011 4.5   . Lymphocytes Relative 07/07/2011 30   . Lymphs Abs 07/07/2011 2.4   . Monocytes Relative 07/07/2011 8   . Monocytes Absolute 07/07/2011 0.7   . Eosinophils Relative 07/07/2011 4   . Eosinophils Absolute 07/07/2011 0.3   . Basophils Relative 07/07/2011 1   . Basophils Absolute 07/07/2011 0.0   . Sodium 07/07/2011 139   . Potassium 07/07/2011 3.7   . Chloride 07/07/2011 100   . CO2 07/07/2011 31   . Glucose, Bld 07/07/2011 104*  . BUN 07/07/2011 15   . Creatinine, Ser 07/07/2011 0.79   . Calcium 07/07/2011 9.3   . Total Protein 07/07/2011 6.9   . Albumin 07/07/2011 3.5   . AST 07/07/2011 15   . ALT 07/07/2011 21   . Alkaline Phosphatase 07/07/2011 104   . Total Bilirubin 07/07/2011 0.2*  . GFR calc non Af Amer 07/07/2011 >90   . GFR calc Af Amer 07/07/2011 >90   . Color, Urine 07/08/2011 YELLOW   . APPearance 07/08/2011 CLOUDY*  . Specific Gravity, Urine 07/08/2011  1.010   . pH 07/08/2011 5.5   . Glucose, UA  07/08/2011 NEGATIVE   . Hgb urine dipstick 07/08/2011 NEGATIVE   . Bilirubin Urine 07/08/2011 NEGATIVE   . Ketones, ur 07/08/2011 NEGATIVE   . Protein, ur 07/08/2011 NEGATIVE   . Urobilinogen, UA 07/08/2011 0.2   . Nitrite 07/08/2011 NEGATIVE   . Leukocytes, UA 07/08/2011 SMALL*  . D-Dimer, Quant 07/07/2011 0.31   . aPTT 07/07/2011 26   . Prothrombin Time 07/07/2011 12.5   . INR 07/07/2011 0.91   . Troponin i, poc 07/07/2011 0.01   . Comment 3 07/07/2011    . POC Troponin I 07/07/2011 0.01   . Squamous Epithelial / LPF 07/08/2011 MANY*  . WBC, UA 07/08/2011 3-6   . RBC / HPF 07/08/2011 0-2   . Bacteria, UA 07/08/2011 MANY*  Orders Only on 07/04/2011  Component Date Value  . Sodium 07/04/2011 138   . Potassium 07/04/2011 3.9   . Chloride 07/04/2011 100   . CO2 07/04/2011 30   . Glucose, Bld 07/04/2011 110*  . BUN 07/04/2011 14   . Creat 07/04/2011 0.73   . Calcium 07/04/2011 9.1   Orders Only on 06/27/2011  Component Date Value  . Sodium 06/27/2011 138   . Potassium 06/27/2011 4.1   . Chloride 06/27/2011 103   . CO2 06/27/2011 28   . Glucose, Bld 06/27/2011 108*  . BUN 06/27/2011 14   . Creat 06/27/2011 0.63   . Calcium 06/27/2011 9.3      Results for this Opt Visit:     Results for orders placed during the hospital encounter of 08/14/11  CBC      Component Value Range   WBC 9.3  4.0 - 10.5 (K/uL)   RBC 4.20  3.87 - 5.11 (MIL/uL)   Hemoglobin 13.0  12.0 - 15.0 (g/dL)   HCT 62.9  52.8 - 41.3 (%)   MCV 92.1  78.0 - 100.0 (fL)   MCH 31.0  26.0 - 34.0 (pg)   MCHC 33.6  30.0 - 36.0 (g/dL)   RDW 24.4  01.0 - 27.2 (%)   Platelets 220  150 - 400 (K/uL)  DIFFERENTIAL      Component Value Range   Neutrophils Relative 59  43 - 77 (%)   Neutro Abs 5.5  1.7 - 7.7 (K/uL)   Lymphocytes Relative 28  12 - 46 (%)   Lymphs Abs 2.6  0.7 - 4.0 (K/uL)   Monocytes Relative 9  3 - 12 (%)   Monocytes Absolute 0.8  0.1 - 1.0 (K/uL)   Eosinophils Relative 4  0 - 5 (%)    Eosinophils Absolute 0.3  0.0 - 0.7 (K/uL)   Basophils Relative 0  0 - 1 (%)   Basophils Absolute 0.0  0.0 - 0.1 (K/uL)  BASIC METABOLIC PANEL      Component Value Range   Sodium 138  135 - 145 (mEq/L)   Potassium 3.7  3.5 - 5.1 (mEq/L)   Chloride 102  96 - 112 (mEq/L)   CO2 27  19 - 32 (mEq/L)   Glucose, Bld 97  70 - 99 (mg/dL)   BUN 15  6 - 23 (mg/dL)   Creatinine, Ser 5.36  0.50 - 1.10 (mg/dL)   Calcium 9.1  8.4 - 64.4 (mg/dL)   GFR calc non Af Amer >90  >90 (mL/min)   GFR calc Af Amer >90  >90 (mL/min)  CARDIAC PANEL(CRET KIN+CKTOT+MB+TROPI)      Component Value Range  Total CK 46  7 - 177 (U/L)   CK, MB 1.5  0.3 - 4.0 (ng/mL)   Troponin I <0.30  <0.30 (ng/mL)   Relative Index RELATIVE INDEX IS INVALID  0.0 - 2.5     EKG Orders placed during the hospital encounter of 08/14/11  . EKG 12-LEAD  . EKG 12-LEAD  . EKG     Prior Assessment and Plan Problem List as of 08/23/2011          Cardiology Problems   Hypertension   Last Assessment & Plan Note   07/11/2011 Office Visit Signed 07/11/2011  4:44 PM by Jodelle Gross, NP    Blood pressure is well controlled at present. No changes at this time.    Cardiomyopathy   Last Assessment & Plan Note   08/01/2011 Office Visit Addendum 08/06/2011  2:27 PM by Kathlen Brunswick, MD    CHF is compensated at present.  Patient had apparent allergy to Spironolactone and cannot afford eplerenone.  She is otherwise tolerating beta blocker, ACE inhibitor and diuretic well.  Dose of quinapril will be increased to 40 mg per day.  Palpitations with right arm discomfort and malaise could reflect a significant arrhythmia.  Patient has an upcoming appointment with Dr. Ladona Ridgel for assessment and consideration for an implanted defibrillator.  Event recording would be desirable, but patient cannot afford this.  Serial Holter monitors will be obtained until a symptomatic spell occurs during recording.    Hyperlipidemia   Last Assessment & Plan Note     03/16/2011 Office Visit Signed 03/16/2011 11:28 AM by Kathlen Brunswick, MD    Patient had very low lipid values in the past when treated with rosuvastatin.  She is not currently taking any lipid lowering therapy except fish oil.  We will recheck a lipid profile.      Other   Morbid obesity   Last Assessment & Plan Note   08/01/2011 Office Visit Signed 08/01/2011  2:18 PM by Kathlen Brunswick, MD    Patient is congratulated on not gaining weight while she was attempting to discontinue tobacco use.    Gastroesophageal reflux disease   Hypothyroidism   Last Assessment & Plan Note   03/16/2011 Office Visit Addendum 03/20/2011 11:58 AM by Kathlen Brunswick, MD    Hypothyroidism was reassessed with a normal TSH in 12/2010.      Tobacco abuse   Last Assessment & Plan Note   08/01/2011 Office Visit Signed 08/01/2011  6:48 PM by Kathlen Brunswick, MD    Complete cessation of smoking advised.    Insomnia   Hypokalemia   Last Assessment & Plan Note   06/27/2011 Office Visit Signed 07/03/2011 11:01 AM by Kathlen Brunswick, MD    Potassium supplement will be decreased in the face of initiating therapy with spironolactone.        Imaging: No results found.   FRS Calculation: Score not calculated. Missing: Total Cholesterol

## 2011-08-23 NOTE — Assessment & Plan Note (Signed)
Melanie Cordova continues to do quite well symptomatically despite very severe impairment in left ventricular systolic function.  Although there has been no improvement in EF with medical therapy, she has remained stable after initially presenting with pulmonary edema.  Doses of furosemide, carvedilol and lisinopril are optimized.  Allergy to spironolactone precludes use of that agent.

## 2011-08-23 NOTE — Patient Instructions (Signed)
Your physician recommends that you schedule a follow-up appointment in: 4 months  Your physician recommends that you return for lab work in: 6 weeks (you will receive a reminder letter)

## 2011-08-23 NOTE — Assessment & Plan Note (Signed)
Blood pressure control is good with current medications, which will be continued. 

## 2011-09-13 ENCOUNTER — Encounter: Payer: Self-pay | Admitting: Internal Medicine

## 2011-09-13 ENCOUNTER — Ambulatory Visit (INDEPENDENT_AMBULATORY_CARE_PROVIDER_SITE_OTHER): Payer: Self-pay | Admitting: Internal Medicine

## 2011-09-13 VITALS — BP 115/33 | HR 63 | Ht 67.5 in | Wt 278.0 lb

## 2011-09-13 DIAGNOSIS — I5022 Chronic systolic (congestive) heart failure: Secondary | ICD-10-CM

## 2011-09-13 NOTE — Patient Instructions (Addendum)
Your physician recommends that you schedule a follow-up appointment in: July 25th for labs and instructions  ICD implant on July 29th - to arrive at hospital at 1130am

## 2011-09-13 NOTE — Assessment & Plan Note (Signed)
Her symptoms are class 2 despite maximal medical therapy. I have discussed the indications, risks, benefits, goals and expectations of ICD implant and she wishes to proceed.

## 2011-09-13 NOTE — Progress Notes (Signed)
HPI Melanie Cordova is referred today to consider ICD implant. She is a pleasant 51 yo woman with a non-ischemic CM, longstanding LV dysfunction with an EF 20% by echo. The patient has chronic systolic CHF, class 2 despite maximal medical therapy. No syncope. She walks to work every day but has to stop several times to catch her breath. Minimal peripheral edema. Her EF has not improved with medical therapy. She has a strong history of sudden death. Allergies  Allergen Reactions  . Penicillins Anaphylaxis and Shortness Of Breath    Hair loss  . Iodinated Diagnostic Agents Hives and Other (See Comments)    Pulmonary problems; no frank respiratory arrest  . Iodine   . Sulfa Antibiotics Other (See Comments)    Pt can't remember reaction  . Spironolactone Rash     Current Outpatient Prescriptions  Medication Sig Dispense Refill  . albuterol (PROVENTIL HFA) 108 (90 BASE) MCG/ACT inhaler Inhale 2 puffs into the lungs every 6 (six) hours as needed. For shortness of breath      . budesonide-formoterol (SYMBICORT) 80-4.5 MCG/ACT inhaler Inhale 2 puffs into the lungs 2 (two) times daily.      . carvedilol (COREG) 25 MG tablet Take 2 tablets twice daily  120 tablet  12  . desloratadine (CLARINEX) 5 MG tablet Take 5 mg by mouth daily.      . ferrous sulfate (IRON SUPPLEMENT) 325 (65 FE) MG tablet Take 325 mg by mouth daily with breakfast.      . fish oil-omega-3 fatty acids 1000 MG capsule Take 3 g by mouth 3 (three) times daily.        . furosemide (LASIX) 80 MG tablet Take 1 tablet (80 mg total) by mouth 2 (two) times daily. Call for increased weight  60 tablet  6  . gabapentin (NEURONTIN) 300 MG capsule Take 300 mg by mouth daily.      Marland Kitchen levothyroxine (SYNTHROID, LEVOTHROID) 300 MCG tablet Take 300 mcg by mouth daily.        . Multiple Vitamin (MULTIVITAMIN) tablet Take 1 tablet by mouth daily.      . naproxen (NAPROSYN) 500 MG tablet Take 500 mg by mouth 2 (two) times daily with a meal.      .  potassium chloride SA (K-DUR,KLOR-CON) 20 MEQ tablet Take 2 tablets (40 mEq total) by mouth 2 (two) times daily.  60 tablet  12  . quinapril (ACCUPRIL) 40 MG tablet Take 1 tablet (40 mg total) by mouth at bedtime.  30 tablet  12  . ranitidine (ZANTAC) 150 MG tablet Take 150 mg by mouth 2 (two) times daily as needed. For gastric reflux      . levothyroxine (SYNTHROID, LEVOTHROID) 112 MCG tablet 300 mcg daily.        Marland Kitchen DISCONTD: furosemide (LASIX) 40 MG tablet Take 2 tablets (80 mg total) by mouth 2 (two) times daily.  60 tablet  0  . DISCONTD: levothyroxine (SYNTHROID, LEVOTHROID) 112 MCG tablet Take 3 tablets (336 mcg total) by mouth daily.  90 tablet  0     Past Medical History  Diagnosis Date  . Hypertension     09/2010-normal CMet and CBC; Lipid profile-116, 88, 25, 73  . Cardiomyopathy 08/2010    Presented with congestive heart failure; EF of 15% and 2012; hypotension on medication precludes optimal dosing  . Gastroesophageal reflux disease   . Hypothyroidism     Recent TSH was normal.  . Pneumonia   . Obesity   .  Hyperlipidemia   . Tobacco abuse     20 pack years  . CHF (congestive heart failure)   . Asthma   . Shortness of breath   . Seizures   . Headache   . Arthritis   . Anxiety   . Neuropathy     ROS:   All systems reviewed and negative except as noted in the HPI.   Past Surgical History  Procedure Date  . Cesarean section     X2     Family History  Problem Relation Age of Onset  . Cardiomyopathy Mother     ICD pacemaker-ischmic CM  . Heart failure Mother   . Hypertension Mother   . Cardiomyopathy Father     Deceased  . Coronary artery disease Father   . Heart failure Father   . Heart failure Brother   . Hypertension Brother      History   Social History  . Marital Status: Divorced    Spouse Name: N/A    Number of Children: 2  . Years of Education: N/A   Occupational History  .      Works in Science writer   Social History Main Topics    . Smoking status: Current Everyday Smoker -- 0.5 packs/day for 20 years    Types: Cigarettes    Last Attempt to Quit: 09/03/2010  . Smokeless tobacco: Not on file  . Alcohol Use: No  . Drug Use: No  . Sexually Active: No   Other Topics Concern  . Not on file   Social History Narrative  . No narrative on file     BP 115/33  Pulse 63  Ht 5' 7.5" (1.715 m)  Wt 278 lb (126.1 kg)  BMI 42.90 kg/m2  Physical Exam:  Well appearing middle aged woman, NAD HEENT: Unremarkable Neck:  No JVD, no thyromegally Lungs:  Clear with no wheezes, rales, or rhonchi.  HEART:  Regular rate rhythm, no murmurs, no rubs, no clicks Abd:  soft, positive bowel sounds, no organomegally, no rebound, no guarding Ext:  2 plus pulses, no edema, no cyanosis, no clubbing Skin:  No rashes no nodules Neuro:  CN II through XII intact, motor grossly intact  EKG NSR   Assess/Plan:   HPI  Allergies  Allergen Reactions  . Penicillins Anaphylaxis and Shortness Of Breath    Hair loss  . Iodinated Diagnostic Agents Hives and Other (See Comments)    Pulmonary problems; no frank respiratory arrest  . Iodine   . Sulfa Antibiotics Other (See Comments)    Pt can't remember reaction  . Spironolactone Rash     Current Outpatient Prescriptions  Medication Sig Dispense Refill  . albuterol (PROVENTIL HFA) 108 (90 BASE) MCG/ACT inhaler Inhale 2 puffs into the lungs every 6 (six) hours as needed. For shortness of breath      . budesonide-formoterol (SYMBICORT) 80-4.5 MCG/ACT inhaler Inhale 2 puffs into the lungs 2 (two) times daily.      . carvedilol (COREG) 25 MG tablet Take 2 tablets twice daily  120 tablet  12  . desloratadine (CLARINEX) 5 MG tablet Take 5 mg by mouth daily.      . ferrous sulfate (IRON SUPPLEMENT) 325 (65 FE) MG tablet Take 325 mg by mouth daily with breakfast.      . fish oil-omega-3 fatty acids 1000 MG capsule Take 3 g by mouth 3 (three) times daily.        . furosemide (LASIX) 80 MG  tablet Take  1 tablet (80 mg total) by mouth 2 (two) times daily. Call for increased weight  60 tablet  6  . gabapentin (NEURONTIN) 300 MG capsule Take 300 mg by mouth daily.      Marland Kitchen levothyroxine (SYNTHROID, LEVOTHROID) 300 MCG tablet Take 300 mcg by mouth daily.        . Multiple Vitamin (MULTIVITAMIN) tablet Take 1 tablet by mouth daily.      . naproxen (NAPROSYN) 500 MG tablet Take 500 mg by mouth 2 (two) times daily with a meal.      . potassium chloride SA (K-DUR,KLOR-CON) 20 MEQ tablet Take 2 tablets (40 mEq total) by mouth 2 (two) times daily.  60 tablet  12  . quinapril (ACCUPRIL) 40 MG tablet Take 1 tablet (40 mg total) by mouth at bedtime.  30 tablet  12  . ranitidine (ZANTAC) 150 MG tablet Take 150 mg by mouth 2 (two) times daily as needed. For gastric reflux      . levothyroxine (SYNTHROID, LEVOTHROID) 112 MCG tablet 300 mcg daily.        Marland Kitchen DISCONTD: furosemide (LASIX) 40 MG tablet Take 2 tablets (80 mg total) by mouth 2 (two) times daily.  60 tablet  0  . DISCONTD: levothyroxine (SYNTHROID, LEVOTHROID) 112 MCG tablet Take 3 tablets (336 mcg total) by mouth daily.  90 tablet  0     Past Medical History  Diagnosis Date  . Hypertension     09/2010-normal CMet and CBC; Lipid profile-116, 88, 25, 73  . Cardiomyopathy 08/2010    Presented with congestive heart failure; EF of 15% and 2012; hypotension on medication precludes optimal dosing  . Gastroesophageal reflux disease   . Hypothyroidism     Recent TSH was normal.  . Pneumonia   . Obesity   . Hyperlipidemia   . Tobacco abuse     20 pack years  . CHF (congestive heart failure)   . Asthma   . Shortness of breath   . Seizures   . Headache   . Arthritis   . Anxiety   . Neuropathy     ROS:   All systems reviewed and negative except as noted in the HPI.   Past Surgical History  Procedure Date  . Cesarean section     X2     Family History  Problem Relation Age of Onset  . Cardiomyopathy Mother     ICD  pacemaker-ischmic CM  . Heart failure Mother   . Hypertension Mother   . Cardiomyopathy Father     Deceased  . Coronary artery disease Father   . Heart failure Father   . Heart failure Brother   . Hypertension Brother      History   Social History  . Marital Status: Divorced    Spouse Name: N/A    Number of Children: 2  . Years of Education: N/A   Occupational History  .      Works in Science writer   Social History Main Topics  . Smoking status: Current Everyday Smoker -- 0.5 packs/day for 20 years    Types: Cigarettes    Last Attempt to Quit: 09/03/2010  . Smokeless tobacco: Not on file  . Alcohol Use: No  . Drug Use: No  . Sexually Active: No   Other Topics Concern  . Not on file   Social History Narrative  . No narrative on file     BP 115/33  Pulse 63  Ht 5' 7.5" (1.715 m)  Wt 278 lb (126.1 kg)  BMI 42.90 kg/m2  Physical Exam:  Well appearing NAD HEENT: Unremarkable Neck:  No JVD, no thyromegally Lymphatics:  No adenopathy Back:  No CVA tenderness Lungs:  Clear HEART:  Regular rate rhythm, no murmurs, no rubs, no clicks Abd:  soft, positive bowel sounds, no organomegally, no rebound, no guarding Ext:  2 plus pulses, no edema, no cyanosis, no clubbing Skin:  No rashes no nodules Neuro:  CN II through XII intact, motor grossly intact  EKG  DEVICE  Normal device function.  See PaceArt for details.   Assess/Plan:  HPI  Allergies  Allergen Reactions  . Penicillins Anaphylaxis and Shortness Of Breath    Hair loss  . Iodinated Diagnostic Agents Hives and Other (See Comments)    Pulmonary problems; no frank respiratory arrest  . Iodine   . Sulfa Antibiotics Other (See Comments)    Pt can't remember reaction  . Spironolactone Rash     Current Outpatient Prescriptions  Medication Sig Dispense Refill  . albuterol (PROVENTIL HFA) 108 (90 BASE) MCG/ACT inhaler Inhale 2 puffs into the lungs every 6 (six) hours as needed. For shortness of  breath      . budesonide-formoterol (SYMBICORT) 80-4.5 MCG/ACT inhaler Inhale 2 puffs into the lungs 2 (two) times daily.      . carvedilol (COREG) 25 MG tablet Take 2 tablets twice daily  120 tablet  12  . desloratadine (CLARINEX) 5 MG tablet Take 5 mg by mouth daily.      . ferrous sulfate (IRON SUPPLEMENT) 325 (65 FE) MG tablet Take 325 mg by mouth daily with breakfast.      . fish oil-omega-3 fatty acids 1000 MG capsule Take 3 g by mouth 3 (three) times daily.        . furosemide (LASIX) 80 MG tablet Take 1 tablet (80 mg total) by mouth 2 (two) times daily. Call for increased weight  60 tablet  6  . gabapentin (NEURONTIN) 300 MG capsule Take 300 mg by mouth daily.      Marland Kitchen levothyroxine (SYNTHROID, LEVOTHROID) 300 MCG tablet Take 300 mcg by mouth daily.        . Multiple Vitamin (MULTIVITAMIN) tablet Take 1 tablet by mouth daily.      . naproxen (NAPROSYN) 500 MG tablet Take 500 mg by mouth 2 (two) times daily with a meal.      . potassium chloride SA (K-DUR,KLOR-CON) 20 MEQ tablet Take 2 tablets (40 mEq total) by mouth 2 (two) times daily.  60 tablet  12  . quinapril (ACCUPRIL) 40 MG tablet Take 1 tablet (40 mg total) by mouth at bedtime.  30 tablet  12  . ranitidine (ZANTAC) 150 MG tablet Take 150 mg by mouth 2 (two) times daily as needed. For gastric reflux      . levothyroxine (SYNTHROID, LEVOTHROID) 112 MCG tablet 300 mcg daily.        Marland Kitchen DISCONTD: furosemide (LASIX) 40 MG tablet Take 2 tablets (80 mg total) by mouth 2 (two) times daily.  60 tablet  0  . DISCONTD: levothyroxine (SYNTHROID, LEVOTHROID) 112 MCG tablet Take 3 tablets (336 mcg total) by mouth daily.  90 tablet  0     Past Medical History  Diagnosis Date  . Hypertension     09/2010-normal CMet and CBC; Lipid profile-116, 88, 25, 73  . Cardiomyopathy 08/2010    Presented with congestive heart failure; EF of 15% and 2012; hypotension on medication precludes optimal dosing  .  Gastroesophageal reflux disease   . Hypothyroidism      Recent TSH was normal.  . Pneumonia   . Obesity   . Hyperlipidemia   . Tobacco abuse     20 pack years  . CHF (congestive heart failure)   . Asthma   . Shortness of breath   . Seizures   . Headache   . Arthritis   . Anxiety   . Neuropathy     ROS:   All systems reviewed and negative except as noted in the HPI.   Past Surgical History  Procedure Date  . Cesarean section     X2     Family History  Problem Relation Age of Onset  . Cardiomyopathy Mother     ICD pacemaker-ischmic CM  . Heart failure Mother   . Hypertension Mother   . Cardiomyopathy Father     Deceased  . Coronary artery disease Father   . Heart failure Father   . Heart failure Brother   . Hypertension Brother      History   Social History  . Marital Status: Divorced    Spouse Name: N/A    Number of Children: 2  . Years of Education: N/A   Occupational History  .      Works in Science writer   Social History Main Topics  . Smoking status: Current Everyday Smoker -- 0.5 packs/day for 20 years    Types: Cigarettes    Last Attempt to Quit: 09/03/2010  . Smokeless tobacco: Not on file  . Alcohol Use: No  . Drug Use: No  . Sexually Active: No   Other Topics Concern  . Not on file   Social History Narrative  . No narrative on file     BP 115/33  Pulse 63  Ht 5' 7.5" (1.715 m)  Wt 278 lb (126.1 kg)  BMI 42.90 kg/m2  Physical Exam:  Well appearing NAD HEENT: Unremarkable Neck:  No JVD, no thyromegally Lymphatics:  No adenopathy Back:  No CVA tenderness Lungs:  Clear HEART:  Regular rate rhythm, no murmurs, no rubs, no clicks Abd:  soft, positive bowel sounds, no organomegally, no rebound, no guarding Ext:  2 plus pulses, no edema, no cyanosis, no clubbing Skin:  No rashes no nodules Neuro:  CN II through XII intact, motor grossly intact  EKG  DEVICE  Normal device function.  See PaceArt for details.   Assess/Plan:

## 2011-10-01 ENCOUNTER — Encounter (HOSPITAL_COMMUNITY): Payer: Self-pay | Admitting: Emergency Medicine

## 2011-10-01 ENCOUNTER — Other Ambulatory Visit: Payer: Self-pay

## 2011-10-01 ENCOUNTER — Emergency Department (HOSPITAL_COMMUNITY): Payer: Medicaid Other

## 2011-10-01 ENCOUNTER — Observation Stay (HOSPITAL_COMMUNITY)
Admission: EM | Admit: 2011-10-01 | Discharge: 2011-10-02 | Disposition: A | Discharge status: Home / Self Care | Payer: Medicaid Other | Attending: Internal Medicine | Admitting: Internal Medicine

## 2011-10-01 DIAGNOSIS — I5022 Chronic systolic (congestive) heart failure: Secondary | ICD-10-CM | POA: Insufficient documentation

## 2011-10-01 DIAGNOSIS — I429 Cardiomyopathy, unspecified: Secondary | ICD-10-CM | POA: Diagnosis present

## 2011-10-01 DIAGNOSIS — E876 Hypokalemia: Secondary | ICD-10-CM | POA: Diagnosis present

## 2011-10-01 DIAGNOSIS — E039 Hypothyroidism, unspecified: Secondary | ICD-10-CM | POA: Diagnosis present

## 2011-10-01 DIAGNOSIS — I10 Essential (primary) hypertension: Secondary | ICD-10-CM | POA: Diagnosis present

## 2011-10-01 DIAGNOSIS — R0602 Shortness of breath: Secondary | ICD-10-CM | POA: Insufficient documentation

## 2011-10-01 DIAGNOSIS — I428 Other cardiomyopathies: Secondary | ICD-10-CM

## 2011-10-01 DIAGNOSIS — R079 Chest pain, unspecified: Principal | ICD-10-CM | POA: Diagnosis present

## 2011-10-01 LAB — CBC WITH DIFFERENTIAL/PLATELET
Basophils Relative: 0 % (ref 0–1)
Eosinophils Absolute: 0.3 10*3/uL (ref 0.0–0.7)
MCH: 31 pg (ref 26.0–34.0)
MCHC: 34.5 g/dL (ref 30.0–36.0)
Monocytes Relative: 9 % (ref 3–12)
Neutrophils Relative %: 62 % (ref 43–77)
Platelets: 255 10*3/uL (ref 150–400)

## 2011-10-01 LAB — BASIC METABOLIC PANEL
BUN: 12 mg/dL (ref 6–23)
Calcium: 9.3 mg/dL (ref 8.4–10.5)
GFR calc Af Amer: 89 mL/min — ABNORMAL LOW (ref >90)
GFR calc non Af Amer: 77 mL/min — ABNORMAL LOW (ref >90)
Potassium: 2.7 mEq/L — CL (ref 3.5–5.1)
Sodium: 140 mEq/L (ref 135–145)

## 2011-10-01 LAB — CK TOTAL AND CKMB (NOT AT ARMC): Total CK: 46 U/L (ref 7–177)

## 2011-10-01 MED ORDER — MORPHINE SULFATE 4 MG/ML IJ SOLN
4.0000 mg | Freq: Once | INTRAMUSCULAR | Status: AC
  Administered 2011-10-01: 4 mg via INTRAVENOUS
  Filled 2011-10-01: qty 1

## 2011-10-01 MED ORDER — POTASSIUM CHLORIDE CRYS ER 20 MEQ PO TBCR
20.0000 meq | EXTENDED_RELEASE_TABLET | Freq: Two times a day (BID) | ORAL | Status: DC
  Administered 2011-10-01 – 2011-10-02 (×2): 20 meq via ORAL
  Filled 2011-10-01: qty 1

## 2011-10-01 MED ORDER — SODIUM CHLORIDE 0.9 % IJ SOLN: 3.0000 mL | INTRAMUSCULAR | Status: DC | PRN

## 2011-10-01 MED ORDER — FUROSEMIDE 80 MG PO TABS
80.0000 mg | ORAL_TABLET | Freq: Two times a day (BID) | ORAL | Status: DC
  Administered 2011-10-01 – 2011-10-02 (×2): 80 mg via ORAL
  Filled 2011-10-01 (×2): qty 1

## 2011-10-01 MED ORDER — POTASSIUM CHLORIDE CRYS ER 20 MEQ PO TBCR
40.0000 meq | EXTENDED_RELEASE_TABLET | Freq: Once | ORAL | Status: AC
  Administered 2011-10-01: 40 meq via ORAL
  Filled 2011-10-01: qty 2

## 2011-10-01 MED ORDER — SODIUM CHLORIDE 0.9 % IV SOLN
250.0000 mL | INTRAVENOUS | Status: DC | PRN
  Administered 2011-10-01: 250 mL via INTRAVENOUS

## 2011-10-01 MED ORDER — POTASSIUM CHLORIDE 10 MEQ/100ML IV SOLN
10.0000 meq | Freq: Once | INTRAVENOUS | Status: AC
  Administered 2011-10-01: 10 meq via INTRAVENOUS
  Filled 2011-10-01: qty 100

## 2011-10-01 MED ORDER — SODIUM CHLORIDE 0.9 % IV SOLN: Freq: Once | INTRAVENOUS | Status: DC

## 2011-10-01 MED ORDER — POTASSIUM CHLORIDE 20 MEQ PO PACK
20.0000 meq | PACK | Freq: Two times a day (BID) | ORAL | Status: DC
  Filled 2011-10-01 (×4): qty 1

## 2011-10-01 MED ORDER — ENOXAPARIN SODIUM 40 MG/0.4ML ~~LOC~~ SOLN
40.0000 mg | SUBCUTANEOUS | Status: DC
  Administered 2011-10-01: 40 mg via SUBCUTANEOUS
  Filled 2011-10-01: qty 0.4

## 2011-10-01 MED ORDER — SODIUM CHLORIDE 0.9 % IJ SOLN
3.0000 mL | Freq: Two times a day (BID) | INTRAMUSCULAR | Status: DC
  Administered 2011-10-01 – 2011-10-02 (×2): 3 mL via INTRAVENOUS
  Filled 2011-10-01: qty 3

## 2011-10-01 MED ORDER — ONDANSETRON HCL 4 MG/2ML IJ SOLN: 4.0000 mg | Freq: Three times a day (TID) | INTRAMUSCULAR | Status: DC | PRN

## 2011-10-01 MED ORDER — BUDESONIDE-FORMOTEROL FUMARATE 80-4.5 MCG/ACT IN AERO
2.0000 | INHALATION_SPRAY | Freq: Two times a day (BID) | RESPIRATORY_TRACT | Status: DC
  Administered 2011-10-02: 2 via RESPIRATORY_TRACT
  Filled 2011-10-01: qty 6.9

## 2011-10-01 MED ORDER — MORPHINE SULFATE 2 MG/ML IJ SOLN: 1.0000 mg | INTRAMUSCULAR | Status: DC | PRN

## 2011-10-01 MED ORDER — SODIUM CHLORIDE 0.9 % IJ SOLN: 3.0000 mL | Freq: Two times a day (BID) | INTRAMUSCULAR | Status: DC

## 2011-10-01 MED ORDER — QUINAPRIL HCL 10 MG PO TABS
40.0000 mg | ORAL_TABLET | Freq: Every day | ORAL | Status: DC
  Filled 2011-10-01 (×2): qty 4

## 2011-10-01 MED ORDER — GABAPENTIN 300 MG PO CAPS
300.0000 mg | ORAL_CAPSULE | Freq: Every day | ORAL | Status: DC
  Administered 2011-10-01 – 2011-10-02 (×2): 300 mg via ORAL
  Filled 2011-10-01 (×2): qty 1

## 2011-10-01 MED ORDER — NITROGLYCERIN 0.4 MG SL SUBL
0.4000 mg | SUBLINGUAL_TABLET | Freq: Once | SUBLINGUAL | Status: DC
Start: 2011-10-01 — End: 2011-10-01

## 2011-10-01 MED ORDER — LEVOTHYROXINE SODIUM 100 MCG PO TABS
300.0000 ug | ORAL_TABLET | Freq: Every day | ORAL | Status: DC
  Administered 2011-10-02: 300 ug via ORAL
  Filled 2011-10-01: qty 3

## 2011-10-01 MED ORDER — CARVEDILOL 12.5 MG PO TABS
50.0000 mg | ORAL_TABLET | Freq: Two times a day (BID) | ORAL | Status: DC
  Administered 2011-10-01 – 2011-10-02 (×2): 50 mg via ORAL
  Filled 2011-10-01 (×2): qty 4

## 2011-10-01 MED ORDER — FERROUS SULFATE 325 (65 FE) MG PO TABS
325.0000 mg | ORAL_TABLET | Freq: Every day | ORAL | Status: DC
  Administered 2011-10-02: 325 mg via ORAL
  Filled 2011-10-01 (×2): qty 1

## 2011-10-01 MED ORDER — MORPHINE SULFATE 4 MG/ML IJ SOLN: 4.0000 mg | INTRAMUSCULAR | Status: DC | PRN

## 2011-10-01 NOTE — ED Notes (Signed)
Cp started today. Supposed to have pacemaker/defib at end of this month inserted. Pt alert/oreinted. Received 4 baby asp and 2 ntg sl en route. Pt had motrin pta. Sharp/dull pain. Was radiating into l arm earlier but not now. Initial pain 7. Pain on arrival is 4. In/out bigeminy en route. Dizziness with PVC's per ems.

## 2011-10-01 NOTE — ED Provider Notes (Signed)
History     CSN: [id number]  Arrival date & time 10/01/11  1752   First MD Initiated Contact with Patient 10/01/11 1757      Chief Complaint  Patient presents with  . Chest Pain    (Consider location/radiation/quality/duration/timing/severity/associated sxs/prior treatment) Patient is a 51 y.o. female presenting with chest pain. The history is provided by the patient.  Chest Pain   She developed sharp left anterior chest pain after walking about a quarter mile. Pain was severe and she rated it at 10/10. It improved somewhat with rest, but never better than 7/10. There is associated dyspnea and diaphoresis, but no nausea. She tried taking ibuprofen with no relief. She has had similar pain in the past and it has been blamed on stress. EMS brought her to the emergency department and gave her aspirin and nitroglycerin en route. With 2 nitroglycerin, pain decreased to 4/10, but she refuses additional nitroglycerin because she is worried about getting a headache. Her pain does not radiate, but she did develop some tingling in her left fingers.    Past Medical History  Diagnosis Date  . Hypertension     09/2010-normal CMet and CBC; Lipid profile-116, 88, 25, 73  . Cardiomyopathy 08/2010    Presented with congestive heart failure; EF of 15% and 2012; hypotension on medication precludes optimal dosing  . Gastroesophageal reflux disease   . Hypothyroidism     Recent TSH was normal.  . Pneumonia   . Obesity   . Hyperlipidemia   . Tobacco abuse     20 pack years  . CHF (congestive heart failure)   . Asthma   . Shortness of breath   . Seizures   . Headache   . Arthritis   . Anxiety   . Neuropathy     Past Surgical History  Procedure Date  . Cesarean section     X2    Family History  Problem Relation Age of Onset  . Cardiomyopathy Mother     ICD pacemaker-ischmic CM  . Heart failure Mother   . Hypertension Mother   . Cardiomyopathy Father     Deceased  . Coronary artery  disease Father   . Heart failure Father   . Heart failure Brother   . Hypertension Brother     History  Substance Use Topics  . Smoking status: Current Everyday Smoker -- 0.5 packs/day for 20 years    Types: Cigarettes    Last Attempt to Quit: 09/03/2010  . Smokeless tobacco: Not on file  . Alcohol Use: No    OB History    Grav Para Term Preterm Abortions TAB SAB Ect Mult Living                  Review of Systems  Cardiovascular: Positive for chest pain.  All other systems reviewed and are negative.    Allergies  Penicillins; Iodinated diagnostic agents; Iodine; Sulfa antibiotics; and Spironolactone  Home Medications   Current Outpatient Rx  Name Route Sig Dispense Refill  . ALBUTEROL SULFATE HFA 108 (90 BASE) MCG/ACT IN AERS Inhalation Inhale 2 puffs into the lungs every 6 (six) hours as needed. For shortness of breath    . BUDESONIDE-FORMOTEROL FUMARATE 80-4.5 MCG/ACT IN AERO Inhalation Inhale 2 puffs into the lungs 2 (two) times daily.    Marland Kitchen CARVEDILOL 25 MG PO TABS  Take 2 tablets twice daily 120 tablet 12  . DESLORATADINE 5 MG PO TABS Oral Take 5 mg by mouth daily.    Marland Kitchen  FERROUS SULFATE 325 (65 FE) MG PO TABS Oral Take 325 mg by mouth daily with breakfast.    . OMEGA-3 FATTY ACIDS 1000 MG PO CAPS Oral Take 3 g by mouth 3 (three) times daily.      . FUROSEMIDE 80 MG PO TABS Oral Take 1 tablet (80 mg total) by mouth 2 (two) times daily. Call for increased weight 60 tablet 6  . GABAPENTIN 300 MG PO CAPS Oral Take 300 mg by mouth daily.    Marland Kitchen LEVOTHYROXINE SODIUM 112 MCG PO TABS  300 mcg daily.      Marland Kitchen LEVOTHYROXINE SODIUM 300 MCG PO TABS Oral Take 300 mcg by mouth daily.      Marland Kitchen ONE-DAILY MULTI VITAMINS PO TABS Oral Take 1 tablet by mouth daily.    Marland Kitchen NAPROXEN 500 MG PO TABS Oral Take 500 mg by mouth 2 (two) times daily with a meal.    . POTASSIUM CHLORIDE CRYS ER 20 MEQ PO TBCR Oral Take 2 tablets (40 mEq total) by mouth 2 (two) times daily. 60 tablet 12  . QUINAPRIL HCL  40 MG PO TABS Oral Take 1 tablet (40 mg total) by mouth at bedtime. 30 tablet 12  . RANITIDINE HCL 150 MG PO TABS Oral Take 150 mg by mouth 2 (two) times daily as needed. For gastric reflux      BP 111/52  Pulse 86  Temp 98.3 F (36.8 C) (Oral)  Resp 20  SpO2 94%  Physical Exam  Nursing note and vitals reviewed. 51 year old female who is resting comfortably and in no acute distress. Vital signs are normal. Oxygen saturation is 94% which is normal. Head is normocephalic and atraumatic. PERRLA, EOMI. Neck is nontender and supple. Back is nontender. Lungs are clear without rales, wheezes, rhonchi. Heart has regular rate and rhythm without murmur. Abdomen is soft, flat, nontender without masses or hepatosplenomegaly. Extremities have no cyanosis or edema, full range of motion is present. Skin is warm and dry without rash. Neurologic: Mental status is normal, cranial nerves are intact, there are no motor or sensory deficits.  ED Course  Procedures (including critical care time)  Results for orders placed during the hospital encounter of 10/01/11  TROPONIN I      Component Value Range   Troponin I <0.30  <0.30 ng/mL  CBC WITH DIFFERENTIAL      Component Value Range   WBC 10.6 (*) 4.0 - 10.5 K/uL   RBC 4.58  3.87 - 5.11 MIL/uL   Hemoglobin 14.2  12.0 - 15.0 g/dL   HCT 16.1  09.6 - 04.5 %   MCV 90.0  78.0 - 100.0 fL   MCH 31.0  26.0 - 34.0 pg   MCHC 34.5  30.0 - 36.0 g/dL   RDW 40.9  81.1 - 91.4 %   Platelets 255  150 - 400 K/uL   Neutrophils Relative 62  43 - 77 %   Neutro Abs 6.5  1.7 - 7.7 K/uL   Lymphocytes Relative 26  12 - 46 %   Lymphs Abs 2.8  0.7 - 4.0 K/uL   Monocytes Relative 9  3 - 12 %   Monocytes Absolute 0.9  0.1 - 1.0 K/uL   Eosinophils Relative 3  0 - 5 %   Eosinophils Absolute 0.3  0.0 - 0.7 K/uL   Basophils Relative 0  0 - 1 %   Basophils Absolute 0.0  0.0 - 0.1 K/uL  BASIC METABOLIC PANEL      Component  Value Range   Sodium 140  135 - 145 mEq/L   Potassium  2.7 (*) 3.5 - 5.1 mEq/L   Chloride 98  96 - 112 mEq/L   CO2 31  19 - 32 mEq/L   Glucose, Bld 104 (*) 70 - 99 mg/dL   BUN 12  6 - 23 mg/dL   Creatinine, Ser 1.61  0.50 - 1.10 mg/dL   Calcium 9.3  8.4 - 09.6 mg/dL   GFR calc non Af Amer 77 (*) >90 mL/min   GFR calc Af Amer 89 (*) >90 mL/min  D-DIMER, QUANTITATIVE      Component Value Range   D-Dimer, Quant <0.22  0.00 - 0.48 ug/mL-FEU   Dg Chest Portable 1 View  10/01/2011  *RADIOLOGY REPORT*  Clinical Data: Chest pain  PORTABLE CHEST - 1 VIEW  Comparison: 07/07/2011  Findings: Lungs are essentially clear. No pleural effusion or pneumothorax.  Stable cardiomegaly.  IMPRESSION: No evidence of acute cardiopulmonary disease.  Stable cardiomegaly.  Original Report Authenticated By: Charline Bills, M.D.      Date: 10/01/2011  Rate: 82  Rhythm: normal sinus rhythm and premature ventricular contractions (PVC)  QRS Axis: left  Intervals: normal  ST/T Wave abnormalities: nonspecific ST/T changes  Conduction Disutrbances:none  Narrative Interpretation: Left ventricular hypertrophy with secondary repolarization abnormality, left atrial hypertrophy. When compared with ECG of 08/14/2011, no significant changes are found.  Old EKG Reviewed: unchanged    1. Chest pain   2. Cardiomyopathy   3. Hypokalemia       MDM  Chest pain of uncertain cause. Review of her record shows that she has a severe cardiomyopathy with ejection fraction of 15-20%, and is scheduled for an implanted defibrillator insertion.  Cardiac markers and d-dimer are normal. Potassium is low, so she's given IV and oral potassium. At this point, I feel she needs to be kept in the hospital to cycle cardiac markers. She has been pain-free after a dose of morphine. Case is discussed with Dr. Onalee Hua who agrees to admit the patient.      Dione Booze, MD 10/01/11 [id number]

## 2011-10-01 NOTE — ED Notes (Signed)
MD at bedside. 

## 2011-10-01 NOTE — H&P (Signed)
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  Pain waxes and wanes since around noon today.  Denies le edema, pleuritic cp, n/v/d/fevers/ or any recent illnesses.  She has had cp in past that is related to stress/anxiety and today she got very stressed and thinks that is what caused her cp.  Cp free now.  Her potassium pills have recently been stopped.  Review of Systems:  O/w neg  Past Medical History: Past Medical History  Diagnosis Date  . Hypertension     09/2010-normal CMet and CBC; Lipid profile-116, 88, 25, 73  . Cardiomyopathy 08/2010    Presented with congestive heart failure; EF of 15% and 2012; hypotension on medication precludes optimal dosing  . Gastroesophageal reflux disease   . Hypothyroidism     Recent TSH was normal.  . Pneumonia   . Obesity   . Hyperlipidemia   . Tobacco abuse     20 pack years  . CHF (congestive heart failure)   . Asthma   . Shortness of breath   . Seizures   . Headache   . Arthritis   . Anxiety   . Neuropathy    Past Surgical History  Procedure Date  . Cesarean section     X2    Medications: Prior to Admission medications   Medication Sig Start Date End Date Taking? Authorizing Provider  albuterol (PROVENTIL HFA) 108 (90 BASE) MCG/ACT inhaler Inhale 2 puffs into the lungs every 6 (six) hours as needed. For shortness of breath    Historical Provider, MD  budesonide-formoterol (SYMBICORT) 80-4.5 MCG/ACT inhaler Inhale 2 puffs into the lungs 2 (two) times daily.    Historical Provider, MD  carvedilol (COREG) 25 MG tablet Take 2 tablets twice daily 01/14/11   Kathlen Brunswick, MD  desloratadine (CLARINEX) 5 MG tablet Take 5 mg by mouth daily.    Historical Provider, MD  ferrous sulfate (IRON SUPPLEMENT) 325 (65 FE) MG tablet Take 325 mg by mouth  daily with breakfast.    Historical Provider, MD  fish oil-omega-3 fatty acids 1000 MG capsule Take 3 g by mouth 3 (three) times daily.      Historical Provider, MD  furosemide (LASIX) 80 MG tablet Take 1 tablet (80 mg total) by mouth 2 (two) times daily. Call for increased weight 05/24/11 05/23/12  Kathlen Brunswick, MD  gabapentin (NEURONTIN) 300 MG capsule Take 300 mg by mouth daily.    Historical Provider, MD  levothyroxine (SYNTHROID, LEVOTHROID) 112 MCG tablet 300 mcg daily.   11/03/10 11/17/10  Nimish Normajean Glasgow, MD  levothyroxine (SYNTHROID, LEVOTHROID) 300 MCG tablet Take 300 mcg by mouth daily.      Historical Provider, MD  Multiple Vitamin (MULTIVITAMIN) tablet Take 1 tablet by mouth daily.    Historical Provider, MD  naproxen (NAPROSYN) 500 MG tablet Take 500 mg by mouth 2 (two) times daily with a meal.    Historical Provider, MD  potassium chloride SA (K-DUR,KLOR-CON) 20 MEQ tablet Take 2 tablets (40 mEq total) by mouth 2 (two) times daily. 07/11/11 07/10/12  Jodelle Gross, NP  quinapril (ACCUPRIL) 40 MG tablet Take 1 tablet (40 mg total) by mouth at bedtime. 08/01/11 07/31/12  Kathlen Brunswick, MD  ranitidine (ZANTAC) 150 MG  tablet Take 150 mg by mouth 2 (two) times daily as needed. For gastric reflux    Historical Provider, MD    Allergies:   Allergies  Allergen Reactions  . Penicillins Anaphylaxis and Shortness Of Breath    Hair loss  . Iodinated Diagnostic Agents Hives and Other (See Comments)    Pulmonary problems; no frank respiratory arrest  . Iodine   . Sulfa Antibiotics Other (See Comments)    Pt can't remember reaction  . Spironolactone Rash    Social History:  reports that she has been smoking Cigarettes.  She has a 10 pack-year smoking history. She does not have any smokeless tobacco history on file. She reports that she does not drink alcohol or use illicit drugs.  Family History: Family History  Problem Relation Age of Onset  . Cardiomyopathy Mother     ICD  pacemaker-ischmic CM  . Heart failure Mother   . Hypertension Mother   . Cardiomyopathy Father     Deceased  . Coronary artery disease Father   . Heart failure Father   . Heart failure Brother   . Hypertension Brother     Physical Exam: Filed Vitals:   10/01/11 1759 10/01/11 1824  BP: 111/52 123/46  Pulse: 86 78  Temp: 98.3 F (36.8 C)   TempSrc: Oral   Resp: 20 17  SpO2: 94% 94%   General appearance: alert, cooperative and no distress Lungs: clear to auscultation bilaterally Heart: regular rate and rhythm, S1, S2 normal, no murmur, click, rub or gallop Abdomen: soft, non-tender; bowel sounds normal; no masses,  no organomegaly Extremities: extremities normal, atraumatic, no cyanosis or edema Pulses: 2+ and symmetric Skin: Skin color, texture, turgor normal. No rashes or lesions Neurologic: Grossly normal    Labs on Admission:   [facility] 10/01/11 1800  NA 140  K 2.7*  CL 98  CO2 31  GLUCOSE 104*  BUN 12  CREATININE 0.87  CALCIUM 9.3  MG --  PHOS --    Basename 10/01/11 1800  WBC 10.6*  NEUTROABS 6.5  HGB 14.2  HCT 41.2  MCV 90.0  PLT 255    Basename 10/01/11 1800  CKTOTAL --  CKMB --  CKMBINDEX --  TROPONINI <0.30   Radiological Exams on Admission: Dg Chest Portable 1 View  10/01/2011  *RADIOLOGY REPORT*  Clinical Data: Chest pain  PORTABLE CHEST - 1 VIEW  Comparison: 07/07/2011  Findings: Lungs are essentially clear. No pleural effusion or pneumothorax.  Stable cardiomegaly.  IMPRESSION: No evidence of acute cardiopulmonary disease.  Stable cardiomegaly.  Original Report Authenticated By: Charline Bills, M.D.    Assessment/Plan Present on Admission:  51 yo female with nicm ef 15%, chronic compensated systolic chf with atypical cp and hypokalemia .Chest pain .Chronic systolic heart failure .Cardiomyopathy .Hypertension .Hypothyroidism .Hypokalemia  ekg nsr with pvc likely due to k level.  Replete k.  Romi.  Tele.  Cont with aicd  placement outpt.  Serial cardiac enzymes.  Joeangel Jeanpaul A [phone] 10/01/2011, 7:16 PM

## 2011-10-01 NOTE — ED Notes (Signed)
Pt states intermittent, nonradiating left chest pain began at ~ 1230. Described as initial burning, then "racing", then "heaviness" . Pt states she also became diaphoretic and had dizziness. Pt was given ASA 324mg  and Nitro SL x 2 with pain decreasing from 7 to 4. Pt refuses additional nitro at this time.

## 2011-10-01 NOTE — ED Notes (Signed)
Patient stated she is hungry - Healthy Choice Chicken dinner prepared and served.  Patient visiting with her fiance' at present.

## 2011-10-01 NOTE — ED Notes (Signed)
IV Potassium turned down and is running along with a bag of normal saline to dilute to level patient can tolerate.  KCL stopped for transport to room 203.  Monitor remains NSR with occ to frequent PVC .  Report given to Misty Stanley, [place]

## 2011-10-01 NOTE — ED Notes (Signed)
CRITICAL VALUE ALERT  Critical value received: Potassium 2.7  Date of notification:  10/01/2011  Time of notification: 1847  Critical value read back:yes  Nurse who received alert:Hallelujah Wysong Mellody Dance, RN  MD notified (1st page):  Dr. Preston Fleeting  Time of first page:  Avon Gully  MD notified (2nd page):  Time of second page:  Responding MD: Dr. Preston Fleeting  Time MD responded:  [phone]

## 2011-10-02 LAB — BASIC METABOLIC PANEL
CO2: 32 mEq/L (ref 19–32)
Calcium: 9.1 mg/dL (ref 8.4–10.5)
GFR calc non Af Amer: >90 mL/min (ref >90)
Glucose, Bld: 105 mg/dL — ABNORMAL HIGH (ref 70–99)
Potassium: 2.7 mEq/L — CL (ref 3.5–5.1)
Sodium: 138 mEq/L (ref 135–145)

## 2011-10-02 LAB — CBC WITH DIFFERENTIAL/PLATELET
Eosinophils Absolute: 0.3 10*3/uL (ref 0.0–0.7)
Hemoglobin: 13.3 g/dL (ref 12.0–15.0)
Lymphocytes Relative: 34 % (ref 12–46)
Lymphs Abs: 2.6 10*3/uL (ref 0.7–4.0)
MCH: 30.7 pg (ref 26.0–34.0)
MCV: 91 fL (ref 78.0–100.0)
Monocytes Relative: 8 % (ref 3–12)
Neutrophils Relative %: 53 % (ref 43–77)
Platelets: 222 10*3/uL (ref 150–400)
RBC: 4.33 MIL/uL (ref 3.87–5.11)
WBC: 7.6 10*3/uL (ref 4.0–10.5)

## 2011-10-02 LAB — CARDIAC PANEL(CRET KIN+CKTOT+MB+TROPI)
CK, MB: 1 ng/mL (ref 0.3–4.0)
CK, MB: 1.1 ng/mL (ref 0.3–4.0)
Relative Index: INVALID (ref 0.0–2.5)
Relative Index: INVALID (ref 0.0–2.5)
Relative Index: INVALID (ref 0.0–2.5)
Total CK: 29 U/L (ref 7–177)
Total CK: 30 U/L (ref 7–177)

## 2011-10-02 LAB — MAGNESIUM: Magnesium: 2 mg/dL (ref 1.5–2.5)

## 2011-10-02 LAB — POTASSIUM: Potassium: 2.7 mEq/L — CL (ref 3.5–5.1)

## 2011-10-02 MED ORDER — POTASSIUM CHLORIDE 10 MEQ/100ML IV SOLN
10.0000 meq | INTRAVENOUS | Status: AC
  Administered 2011-10-02 (×4): 10 meq via INTRAVENOUS
  Filled 2011-10-02: qty 600

## 2011-10-02 MED ORDER — POTASSIUM CHLORIDE CRYS ER 20 MEQ PO TBCR: 20.0000 meq | EXTENDED_RELEASE_TABLET | Freq: Two times a day (BID) | ORAL | Status: DC

## 2011-10-02 MED ORDER — LISINOPRIL 10 MG PO TABS: 40.0000 mg | ORAL_TABLET | Freq: Every day | ORAL | Status: DC

## 2011-10-02 MED ORDER — BIOTENE DRY MOUTH MT LIQD
15.0000 mL | Freq: Two times a day (BID) | OROMUCOSAL | Status: DC
  Administered 2011-10-02: 15 mL via OROMUCOSAL

## 2011-10-02 NOTE — Discharge Summary (Signed)
Physician Discharge Summary  Melanie Cordova ZOX:[id number] DOB: 06/18/1960 DOA: 10/01/2011  PCP: [name], NP  Admit date: 10/01/2011 Discharge date: 10/02/2011  Recommendations for Outpatient Follow-up:  1. Follow up with Dr. Dietrich Pates in 1 week, patient to make appointment 2. 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 Pain waxes and wanes since around noon today. Denies le edema, pleuritic cp, n/v/d/fevers/ or any recent illnesses. She has had cp in past that is related to stress/anxiety and today she got very stressed and thinks that is what caused her cp. Cp free now. Her potassium pills have recently been stopped.   Hospital Course:  Patient was admitted overnight to a telemetry unit.  She did not have any further recurrence of her pain.  She is ambulating in the halls without recurrence of any discomfort of shortness of breath.  She feels better and wants to go home.  Her cardiac enzymes were found to be negative. Her pain was likely secondary to anxiety.  She will follow up with her cardiologist next week.  She was found to be hypokalemic and she is on lasix.  Her potassium has been replaced and she will be put on a scheduled dosing to be taken with lasix.  This can be further followed in the outpatient setting.  Procedures:  none  Consultations:  none  Discharge Exam: Filed Vitals:   10/02/11 0535  BP:   Pulse: 62  Temp: 98.2 F (36.8 C)  Resp: 20   Filed Vitals:   10/01/11 1824 10/01/11 2018 10/01/11 2141  10/02/11 0535  BP: 123/46 108/59 134/78   Pulse: 78 78 50 62  Temp:  97.9 F (36.6 C) 98.1 F (36.7 C) 98.2 F (36.8 C)  TempSrc:   Oral   Resp: 17 20 20 20   Height:   5\' 7"  (1.702 m)   Weight:   124.9 kg (275 lb 5.7 oz)   SpO2: 94% 99% 98%    General: NAD, sitting up in bed, eating breakfast Cardiovascular: s1, s2, rrr Respiratory: CTA B  Discharge Instructions  Discharge Orders    Future Appointments: Provider: Department: Dept Phone: Center:   10/06/2011 10:15 AM [facility] [phone] [id number]     Future Orders Please Complete By Expires   Diet - low sodium heart healthy      Increase activity slowly      Call MD for:  severe uncontrolled pain      Call MD for:  difficulty breathing, headache or visual disturbances      Call MD for:  persistant dizziness or light-headedness      (HEART FAILURE PATIENTS) Call MD:  Anytime you have any of the following symptoms: 1) 3 pound weight gain in 24 hours or 5 pounds in 1 week 2) shortness of breath, with or without a dry hacking cough 3) swelling in the hands, feet or stomach 4) if you have to sleep on extra pillows at night in order to breathe.        Medication List  As  of 10/02/2011  9:11 AM   STOP taking these medications         levothyroxine 112 MCG tablet         TAKE these medications         budesonide-formoterol 80-4.5 MCG/ACT inhaler   Commonly known as: SYMBICORT   Inhale 2 puffs into the lungs 2 (two) times daily.      carvedilol 25 MG tablet   Commonly known as: COREG   Take 50 mg by mouth 2 (two) times daily. Take 2 tablets twice daily      desloratadine 5 MG tablet   Commonly known as: CLARINEX   Take 5 mg by mouth daily.      fish oil-omega-3 fatty acids 1000 MG capsule   Take 3 g by mouth 3 (three) times daily.      furosemide 40 MG tablet   Commonly known as: LASIX   Take 80 mg by mouth 2 (two) times daily.      gabapentin 300 MG capsule   Commonly known as:  NEURONTIN   Take 300 mg by mouth daily.      IRON SUPPLEMENT 325 (65 FE) MG tablet   Generic drug: ferrous sulfate   Take 325 mg by mouth daily with breakfast.      levothyroxine 300 MCG tablet   Commonly known as: SYNTHROID, LEVOTHROID   Take 300 mcg by mouth daily.      multivitamin tablet   Take 1 tablet by mouth daily.      potassium chloride SA 20 MEQ tablet   Commonly known as: K-DUR,KLOR-CON   Take 1 tablet (20 mEq total) by mouth 2 (two) times daily.      PROVENTIL HFA 108 (90 BASE) MCG/ACT inhaler   Generic drug: albuterol   Inhale 2 puffs into the lungs every 6 (six) hours as needed. For shortness of breath      quinapril 40 MG tablet   Commonly known as: ACCUPRIL   Take 1 tablet (40 mg total) by mouth at bedtime.      ranitidine 150 MG tablet   Commonly known as: ZANTAC   Take 150 mg by mouth 2 (two) times daily as needed. For gastric reflux           Follow-up Information    Follow up with [name], MD. Schedule an appointment as soon as possible for a visit in 1 week.   Contact information:   [address] [place] [address] [phone]       Follow up with [name], NP. Schedule an appointment as soon as possible for a visit in 2 weeks.          The results of significant diagnostics from this hospitalization (including imaging, microbiology, ancillary and laboratory) are listed below for reference.    Significant Diagnostic Studies: Dg Chest Portable 1 View  10/01/2011  *RADIOLOGY REPORT*  Clinical Data: Chest pain  PORTABLE CHEST - 1 VIEW  Comparison: 07/07/2011  Findings: Lungs are essentially clear. No pleural effusion or pneumothorax.  Stable cardiomegaly.  IMPRESSION: No evidence of acute cardiopulmonary disease.  Stable cardiomegaly.  Original Report Authenticated By: Charline Bills, M.D.    Microbiology: No results found for this or any previous visit (from the past 240 hour(s)).   Labs: Basic Metabolic  Panel:  Lab 10/02/11 0740 10/02/11 0157 10/01/11 1800  NA -- 138 140  K -- 2.7* 2.7*  CL -- 97 98  CO2 -- 32 31  GLUCOSE -- 105* 104*  BUN -- 11 12  CREATININE -- 0.72 0.87  CALCIUM -- 9.1 9.3  MG 2.0 -- --  PHOS -- -- --   Liver Function Tests: No results found for this basename: AST:5,ALT:5,ALKPHOS:5,BILITOT:5,PROT:5,ALBUMIN:5 in the last 168 hours No results found for this basename: LIPASE:5,AMYLASE:5 in the last 168 hours No results found for this basename: AMMONIA:5 in the last 168 hours CBC:  Lab 10/02/11 0157 10/01/11 1800  WBC 7.6 10.6*  NEUTROABS 4.1 6.5  HGB 13.3 14.2  HCT 39.4 41.2  MCV 91.0 90.0  PLT 222 255   Cardiac Enzymes:  Lab 10/02/11 0740 10/02/11 0157 10/01/11 1800  CKTOTAL 29 34 46  CKMB 1.0 1.2 1.3  CKMBINDEX -- -- --  TROPONINI <0.30 <0.30 <0.30   BNP: BNP (last 3 results)  Basename 04/21/11 0403 04/20/11 1937 11/17/10 0031  PROBNP 1464.0* 1563.0* 963.8*   CBG: No results found for this basename: GLUCAP:5 in the last 168 hours  Time coordinating discharge: 35 mins  Signed:  Marti Mclane  [facility] 10/02/2011, 9:11 AM

## 2011-10-02 NOTE — Discharge Instructions (Signed)
Chest Pain (Nonspecific) It is often hard to give a specific diagnosis for the cause of chest pain. There is always a chance that your pain could be related to something serious, such as a heart attack or a blood clot in the lungs. You need to follow up with your caregiver for further evaluation. CAUSES   Heartburn.   Pneumonia or bronchitis.   Anxiety or stress.   Inflammation around your heart (pericarditis) or lung (pleuritis or pleurisy).   A blood clot in the lung.   A collapsed lung (pneumothorax). It can develop suddenly on its own (spontaneous pneumothorax) or from injury (trauma) to the chest.   Shingles infection (herpes zoster virus).  The chest wall is composed of bones, muscles, and cartilage. Any of these can be the source of the pain.  The bones can be bruised by injury.   The muscles or cartilage can be strained by coughing or overwork.   The cartilage can be affected by inflammation and become sore (costochondritis).  DIAGNOSIS  Lab tests or other studies, such as X-rays, electrocardiography, stress testing, or cardiac imaging, may be needed to find the cause of your pain.  TREATMENT   Treatment depends on what may be causing your chest pain. Treatment may include:   Acid blockers for heartburn.   Anti-inflammatory medicine.   Pain medicine for inflammatory conditions.   Antibiotics if an infection is present.   You may be advised to change lifestyle habits. This includes stopping smoking and avoiding alcohol, caffeine, and chocolate.   You may be advised to keep your head raised (elevated) when sleeping. This reduces the chance of acid going backward from your stomach into your esophagus.   Most of the time, nonspecific chest pain will improve within 2 to 3 days with rest and mild pain medicine.  HOME CARE INSTRUCTIONS   If antibiotics were prescribed, take your antibiotics as directed. Finish them even if you start to feel better.   For the next few  days, avoid physical activities that bring on chest pain. Continue physical activities as directed.   Do not smoke.   Avoid drinking alcohol.   Only take over-the-counter or prescription medicine for pain, discomfort, or fever as directed by your caregiver.   Follow your caregiver's suggestions for further testing if your chest pain does not go away.   Keep any follow-up appointments you made. If you do not go to an appointment, you could develop lasting (chronic) problems with pain. If there is any problem keeping an appointment, you must call to reschedule.  SEEK MEDICAL CARE IF:   You think you are having problems from the medicine you are taking. Read your medicine instructions carefully.   Your chest pain does not go away, even after treatment.   You develop a rash with blisters on your chest.  SEEK IMMEDIATE MEDICAL CARE IF:   You have increased chest pain or pain that spreads to your arm, neck, jaw, back, or abdomen.   You develop shortness of breath, an increasing cough, or you are coughing up blood.   You have severe back or abdominal pain, feel nauseous, or vomit.   You develop severe weakness, fainting, or chills.   You have a fever.  THIS IS AN EMERGENCY. Do not wait to see if the pain will go away. Get medical help at once. Call your local emergency services (911 in [place] Do not drive yourself to the hospital. MAKE SURE YOU:   Understand these instructions.     Will watch your condition.   Will get help right away if you are not doing well or get worse.  Document Released: 12/08/2004 Document Revised: 02/17/2011 Document Reviewed: 10/04/2007 [employer] Patient Information 2012 [employer]. 

## 2011-10-03 ENCOUNTER — Other Ambulatory Visit: Payer: Self-pay | Admitting: *Deleted

## 2011-10-03 DIAGNOSIS — E782 Mixed hyperlipidemia: Secondary | ICD-10-CM

## 2011-10-03 DIAGNOSIS — I10 Essential (primary) hypertension: Secondary | ICD-10-CM

## 2011-10-06 ENCOUNTER — Encounter: Payer: Self-pay | Admitting: *Deleted

## 2011-10-06 ENCOUNTER — Other Ambulatory Visit: Payer: Self-pay | Admitting: *Deleted

## 2011-10-06 ENCOUNTER — Ambulatory Visit (INDEPENDENT_AMBULATORY_CARE_PROVIDER_SITE_OTHER): Payer: Self-pay | Admitting: *Deleted

## 2011-10-06 VITALS — BP 98/70 | HR 83

## 2011-10-06 DIAGNOSIS — I10 Essential (primary) hypertension: Secondary | ICD-10-CM

## 2011-10-06 DIAGNOSIS — Z01812 Encounter for preprocedural laboratory examination: Secondary | ICD-10-CM

## 2011-10-06 DIAGNOSIS — E876 Hypokalemia: Secondary | ICD-10-CM

## 2011-10-06 LAB — BASIC METABOLIC PANEL
Chloride: 102 mEq/L (ref 96–112)
Potassium: 4.3 mEq/L (ref 3.5–5.3)
Sodium: 140 mEq/L (ref 135–145)

## 2011-10-06 LAB — LIPID PANEL
LDL Cholesterol: 136 mg/dL — ABNORMAL HIGH (ref 0–99)
VLDL: 67 mg/dL — ABNORMAL HIGH (ref 0–40)

## 2011-10-06 LAB — CBC
Hemoglobin: 15.1 g/dL — ABNORMAL HIGH (ref 12.0–15.0)
MCHC: 35.6 g/dL (ref 30.0–36.0)
RBC: 4.9 MIL/uL (ref 3.87–5.11)
WBC: 11.6 10*3/uL — ABNORMAL HIGH (ref 4.0–10.5)

## 2011-10-06 LAB — PROTIME-INR: INR: 0.97 (ref <1.50)

## 2011-10-06 LAB — APTT: aPTT: 28 seconds (ref 24–37)

## 2011-10-06 NOTE — Progress Notes (Signed)
Patient presents for a blood pressure check.  Advised me that she was in the hospital 7/20 for K+ 2.7, after being advised by her pharmacy that her potassium supplement had been stopped, despite taking 80 mg of lasix bid.  Patient was under the impression that it had been stopped by our office and neglected to call our office to clarify the change.  Is currently on potassium supplement, as prescribed.  Will be sent for repeat BMET today.

## 2011-10-08 ENCOUNTER — Encounter: Payer: Self-pay | Admitting: Cardiology

## 2011-10-10 ENCOUNTER — Ambulatory Visit (HOSPITAL_COMMUNITY)
Admission: RE | Admit: 2011-10-10 | Discharge: 2011-10-11 | Disposition: A | Discharge status: Home / Self Care | Payer: Medicaid Other | Source: Ambulatory Visit | Attending: Internal Medicine | Admitting: Internal Medicine

## 2011-10-10 ENCOUNTER — Other Ambulatory Visit: Payer: Self-pay | Admitting: *Deleted

## 2011-10-10 ENCOUNTER — Encounter (HOSPITAL_COMMUNITY)
Admission: RE | Disposition: A | Discharge status: Home / Self Care | Payer: Self-pay | Source: Ambulatory Visit | Attending: Internal Medicine

## 2011-10-10 ENCOUNTER — Encounter (HOSPITAL_COMMUNITY): Payer: Self-pay | Admitting: General Practice

## 2011-10-10 DIAGNOSIS — I10 Essential (primary) hypertension: Secondary | ICD-10-CM | POA: Insufficient documentation

## 2011-10-10 DIAGNOSIS — I428 Other cardiomyopathies: Secondary | ICD-10-CM

## 2011-10-10 DIAGNOSIS — Z720 Tobacco use: Secondary | ICD-10-CM

## 2011-10-10 DIAGNOSIS — I5022 Chronic systolic (congestive) heart failure: Secondary | ICD-10-CM | POA: Insufficient documentation

## 2011-10-10 DIAGNOSIS — E785 Hyperlipidemia, unspecified: Secondary | ICD-10-CM | POA: Insufficient documentation

## 2011-10-10 DIAGNOSIS — J45909 Unspecified asthma, uncomplicated: Secondary | ICD-10-CM | POA: Insufficient documentation

## 2011-10-10 DIAGNOSIS — I429 Cardiomyopathy, unspecified: Secondary | ICD-10-CM

## 2011-10-10 DIAGNOSIS — E669 Obesity, unspecified: Secondary | ICD-10-CM | POA: Insufficient documentation

## 2011-10-10 DIAGNOSIS — M129 Arthropathy, unspecified: Secondary | ICD-10-CM | POA: Insufficient documentation

## 2011-10-10 DIAGNOSIS — Z95810 Presence of automatic (implantable) cardiac defibrillator: Secondary | ICD-10-CM

## 2011-10-10 DIAGNOSIS — R079 Chest pain, unspecified: Secondary | ICD-10-CM

## 2011-10-10 DIAGNOSIS — K219 Gastro-esophageal reflux disease without esophagitis: Secondary | ICD-10-CM | POA: Insufficient documentation

## 2011-10-10 DIAGNOSIS — Z8674 Personal history of sudden cardiac arrest: Secondary | ICD-10-CM | POA: Insufficient documentation

## 2011-10-10 DIAGNOSIS — I509 Heart failure, unspecified: Secondary | ICD-10-CM | POA: Insufficient documentation

## 2011-10-10 HISTORY — PX: IMPLANTABLE CARDIOVERTER DEFIBRILLATOR IMPLANT: SHX5473

## 2011-10-10 HISTORY — DX: Presence of automatic (implantable) cardiac defibrillator: Z95.810

## 2011-10-10 LAB — SURGICAL PCR SCREEN: Staphylococcus aureus: NEGATIVE

## 2011-10-10 SURGERY — IMPLANTABLE CARDIOVERTER DEFIBRILLATOR IMPLANT

## 2011-10-10 MED ORDER — MIDAZOLAM HCL 5 MG/5ML IJ SOLN
INTRAMUSCULAR | Status: AC
  Filled 2011-10-10: qty 5

## 2011-10-10 MED ORDER — LORATADINE 10 MG PO TABS
10.0000 mg | ORAL_TABLET | Freq: Every day | ORAL | Status: DC
  Administered 2011-10-11: 10:00:00 10 mg via ORAL
  Filled 2011-10-10: qty 1

## 2011-10-10 MED ORDER — LISINOPRIL 40 MG PO TABS
40.0000 mg | ORAL_TABLET | Freq: Every day | ORAL | Status: DC
  Administered 2011-10-10: 40 mg via ORAL
  Filled 2011-10-10 (×2): qty 1

## 2011-10-10 MED ORDER — GENTAMICIN SULFATE 40 MG/ML IJ SOLN
80.0000 mg | INTRAMUSCULAR | Status: DC
  Filled 2011-10-10: qty 2

## 2011-10-10 MED ORDER — LEVOTHYROXINE SODIUM 150 MCG PO TABS
300.0000 ug | ORAL_TABLET | Freq: Every day | ORAL | Status: DC
  Administered 2011-10-11: 300 ug via ORAL
  Filled 2011-10-10 (×2): qty 2

## 2011-10-10 MED ORDER — ALBUTEROL SULFATE HFA 108 (90 BASE) MCG/ACT IN AERS: 2.0000 | INHALATION_SPRAY | Freq: Four times a day (QID) | RESPIRATORY_TRACT | Status: DC | PRN

## 2011-10-10 MED ORDER — SODIUM CHLORIDE 0.9 % IV SOLN: 250.0000 mL | INTRAVENOUS | Status: DC

## 2011-10-10 MED ORDER — POTASSIUM CHLORIDE CRYS ER 20 MEQ PO TBCR
20.0000 meq | EXTENDED_RELEASE_TABLET | Freq: Two times a day (BID) | ORAL | Status: DC
  Administered 2011-10-10 – 2011-10-11 (×2): 20 meq via ORAL
  Filled 2011-10-10 (×3): qty 1

## 2011-10-10 MED ORDER — BUDESONIDE-FORMOTEROL FUMARATE 80-4.5 MCG/ACT IN AERO
2.0000 | INHALATION_SPRAY | Freq: Two times a day (BID) | RESPIRATORY_TRACT | Status: DC
  Administered 2011-10-10 – 2011-10-11 (×2): 2 via RESPIRATORY_TRACT
  Filled 2011-10-10: qty 6.9

## 2011-10-10 MED ORDER — CARVEDILOL 25 MG PO TABS
50.0000 mg | ORAL_TABLET | Freq: Two times a day (BID) | ORAL | Status: DC
  Administered 2011-10-10 – 2011-10-11 (×2): 50 mg via ORAL
  Filled 2011-10-10 (×4): qty 2

## 2011-10-10 MED ORDER — SODIUM CHLORIDE 0.9 % IJ SOLN: 3.0000 mL | INTRAMUSCULAR | Status: DC | PRN

## 2011-10-10 MED ORDER — FENTANYL CITRATE 0.05 MG/ML IJ SOLN
INTRAMUSCULAR | Status: AC
  Filled 2011-10-10: qty 2

## 2011-10-10 MED ORDER — SODIUM CHLORIDE 0.9 % IJ SOLN: 3.0000 mL | Freq: Two times a day (BID) | INTRAMUSCULAR | Status: DC

## 2011-10-10 MED ORDER — FUROSEMIDE 80 MG PO TABS
80.0000 mg | ORAL_TABLET | Freq: Two times a day (BID) | ORAL | Status: DC
  Administered 2011-10-10 – 2011-10-11 (×2): 80 mg via ORAL
  Filled 2011-10-10 (×4): qty 1

## 2011-10-10 MED ORDER — ACETAMINOPHEN 325 MG PO TABS
325.0000 mg | ORAL_TABLET | ORAL | Status: DC | PRN
  Administered 2011-10-10: 22:00:00 650 mg via ORAL
  Administered 2011-10-11: 325 mg via ORAL
  Administered 2011-10-11: 650 mg via ORAL
  Filled 2011-10-10 (×3): qty 2

## 2011-10-10 MED ORDER — QUINAPRIL HCL 10 MG PO TABS: 40.0000 mg | ORAL_TABLET | Freq: Every day | ORAL | Status: DC

## 2011-10-10 MED ORDER — SODIUM CHLORIDE 0.9 % IV SOLN
1500.0000 mg | INTRAVENOUS | Status: DC
  Filled 2011-10-10: qty 1500

## 2011-10-10 MED ORDER — MUPIROCIN 2 % EX OINT
TOPICAL_OINTMENT | CUTANEOUS | Status: AC
  Filled 2011-10-10: qty 22

## 2011-10-10 MED ORDER — FERROUS SULFATE 325 (65 FE) MG PO TABS
325.0000 mg | ORAL_TABLET | Freq: Every day | ORAL | Status: DC
  Administered 2011-10-11: 325 mg via ORAL
  Filled 2011-10-10 (×2): qty 1

## 2011-10-10 MED ORDER — LIDOCAINE HCL (PF) 1 % IJ SOLN
INTRAMUSCULAR | Status: AC
  Filled 2011-10-10: qty 60

## 2011-10-10 MED ORDER — MUPIROCIN 2 % EX OINT: TOPICAL_OINTMENT | Freq: Two times a day (BID) | CUTANEOUS | Status: DC

## 2011-10-10 MED ORDER — SODIUM CHLORIDE 0.45 % IV SOLN
INTRAVENOUS | Status: DC
  Administered 2011-10-10: 12:00:00 via INTRAVENOUS

## 2011-10-10 MED ORDER — ONDANSETRON HCL 4 MG/2ML IJ SOLN: 4.0000 mg | Freq: Four times a day (QID) | INTRAMUSCULAR | Status: DC | PRN

## 2011-10-10 MED ORDER — CHLORHEXIDINE GLUCONATE 4 % EX LIQD
60.0000 mL | Freq: Once | CUTANEOUS | Status: DC
  Filled 2011-10-10: qty 60

## 2011-10-10 MED ORDER — FAMOTIDINE 10 MG PO TABS
10.0000 mg | ORAL_TABLET | Freq: Every day | ORAL | Status: DC
  Administered 2011-10-10 – 2011-10-11 (×2): 10 mg via ORAL
  Filled 2011-10-10 (×2): qty 1

## 2011-10-10 MED ORDER — VANCOMYCIN HCL IN DEXTROSE 1-5 GM/200ML-% IV SOLN
1000.0000 mg | Freq: Two times a day (BID) | INTRAVENOUS | Status: AC
  Administered 2011-10-11: 1000 mg via INTRAVENOUS
  Filled 2011-10-10: qty 200

## 2011-10-10 MED ORDER — GABAPENTIN 300 MG PO CAPS
300.0000 mg | ORAL_CAPSULE | Freq: Every day | ORAL | Status: DC
  Administered 2011-10-10: 22:00:00 300 mg via ORAL
  Filled 2011-10-10 (×2): qty 1

## 2011-10-10 NOTE — H&P (Signed)
HPI  Melanie Cordova is referred today to consider ICD implant. She is a pleasant 51 yo woman with a non-ischemic CM, longstanding LV dysfunction with an EF 20% by echo. The patient has chronic systolic CHF, class 2 despite maximal medical therapy. No syncope. She walks to work every day but has to stop several times to catch her breath. Minimal peripheral edema. Her EF has not improved with medical therapy. She has a strong history of sudden death.  Allergies   Allergen  Reactions   .  Penicillins  Anaphylaxis and Shortness Of Breath     Hair loss   .  Iodinated Diagnostic Agents  Hives and Other (See Comments)     Pulmonary problems; no frank respiratory arrest   .  Iodine    .  Sulfa Antibiotics  Other (See Comments)     Pt can't remember reaction   .  Spironolactone  Rash    Current Outpatient Prescriptions   Medication  Sig  Dispense  Refill   .  albuterol (PROVENTIL HFA) 108 (90 BASE) MCG/ACT inhaler  Inhale 2 puffs into the lungs every 6 (six) hours as needed. For shortness of breath     .  budesonide-formoterol (SYMBICORT) 80-4.5 MCG/ACT inhaler  Inhale 2 puffs into the lungs 2 (two) times daily.     .  carvedilol (COREG) 25 MG tablet  Take 2 tablets twice daily  120 tablet  12   .  desloratadine (CLARINEX) 5 MG tablet  Take 5 mg by mouth daily.     .  ferrous sulfate (IRON SUPPLEMENT) 325 (65 FE) MG tablet  Take 325 mg by mouth daily with breakfast.     .  fish oil-omega-3 fatty acids 1000 MG capsule  Take 3 g by mouth 3 (three) times daily.     .  furosemide (LASIX) 80 MG tablet  Take 1 tablet (80 mg total) by mouth 2 (two) times daily. Call for increased weight  60 tablet  6   .  gabapentin (NEURONTIN) 300 MG capsule  Take 300 mg by mouth daily.     Marland Kitchen  levothyroxine (SYNTHROID, LEVOTHROID) 300 MCG tablet  Take 300 mcg by mouth daily.     .  Multiple Vitamin (MULTIVITAMIN) tablet  Take 1 tablet by mouth daily.     .  naproxen (NAPROSYN) 500 MG tablet  Take 500 mg by mouth 2 (two)  times daily with a meal.     .  potassium chloride SA (K-DUR,KLOR-CON) 20 MEQ tablet  Take 2 tablets (40 mEq total) by mouth 2 (two) times daily.  60 tablet  12   .  quinapril (ACCUPRIL) 40 MG tablet  Take 1 tablet (40 mg total) by mouth at bedtime.  30 tablet  12   .  ranitidine (ZANTAC) 150 MG tablet  Take 150 mg by mouth 2 (two) times daily as needed. For gastric reflux     .  levothyroxine (SYNTHROID, LEVOTHROID) 112 MCG tablet  300 mcg daily.     Marland Kitchen  DISCONTD: furosemide (LASIX) 40 MG tablet  Take 2 tablets (80 mg total) by mouth 2 (two) times daily.  60 tablet  0   .  DISCONTD: levothyroxine (SYNTHROID, LEVOTHROID) 112 MCG tablet  Take 3 tablets (336 mcg total) by mouth daily.  90 tablet  0    Past Medical History   Diagnosis  Date   .  Hypertension      09/2010-normal CMet and CBC; Lipid profile-116, 88, 25,  73   .  Cardiomyopathy  08/2010     Presented with congestive heart failure; EF of 15% and 2012; hypotension on medication precludes optimal dosing   .  Gastroesophageal reflux disease    .  Hypothyroidism      Recent TSH was normal.   .  Pneumonia    .  Obesity    .  Hyperlipidemia    .  Tobacco abuse      20 pack years   .  CHF (congestive heart failure)    .  Asthma    .  Shortness of breath    .  Seizures    .  Headache    .  Arthritis    .  Anxiety    .  Neuropathy     ROS:  All systems reviewed and negative except as noted in the HPI.  Past Surgical History   Procedure  Date   .  Cesarean section      X2    Family History   Problem  Relation  Age of Onset   .  Cardiomyopathy  Mother       ICD pacemaker-ischmic CM    .  Heart failure  Mother    .  Hypertension  Mother    .  Cardiomyopathy  Father       Deceased    .  Coronary artery disease  Father    .  Heart failure  Father    .  Heart failure  Brother    .  Hypertension  Brother     History    Social History   .  Marital Status:  Divorced     Spouse Name:  N/A     Number of Children:  2   .   Years of Education:  N/A    Occupational History   .       Works in Science writer    Social History Main Topics   .  Smoking status:  Current Everyday Smoker -- 0.5 packs/day for 20 years     Types:  Cigarettes     Last Attempt to Quit:  09/03/2010   .  Smokeless tobacco:  Not on file   .  Alcohol Use:  No   .  Drug Use:  No   .  Sexually Active:  No    Other Topics  Concern   .  Not on file    Social History Narrative   .  No narrative on file    BP 115/33  Pulse 63  Ht 5' 7.5" (1.715 m)  Wt 278 lb (126.1 kg)  BMI 42.90 kg/m2  Physical Exam:  Well appearing middle aged woman, NAD  HEENT: Unremarkable  Neck: No JVD, no thyromegally  Lungs: Clear with no wheezes, rales, or rhonchi.  HEART: Regular rate rhythm, no murmurs, no rubs, no clicks  Abd: soft, positive bowel sounds, no organomegally, no rebound, no guarding  Ext: 2 plus pulses, no edema, no cyanosis, no clubbing  Skin: No rashes no nodules  Neuro: CN II through XII intact, motor grossly intact  EKG  NSR  Assess/Plan:  1. Non-ischemic cardiomyopathy 2. Chronic systolic CHF, EF 16-10% despite maximal medical therapy 3. HTN Rec: I have discussed the treatment options with the patient and the risks/benefits/goals/expecations of ICD implant and she wishes to proceed.  Lewayne Bunting, M.D.

## 2011-10-10 NOTE — Op Note (Signed)
ICD implantation via the left cephalic vein without immediate complication. Z#[id number].

## 2011-10-10 NOTE — Progress Notes (Signed)
Patient ID: Melanie Cordova, female   DOB: 07-29-1960, 51 y.o.   MRN: [id number]  K-3.1 on 11/07/2011. Increased KCl to 40 mEq twice a day Bmet in one month.

## 2011-10-11 ENCOUNTER — Ambulatory Visit (HOSPITAL_COMMUNITY): Payer: Medicaid Other

## 2011-10-11 NOTE — Discharge Summary (Addendum)
ELECTROPHYSIOLOGY PROCEDURE DISCHARGE SUMMARY    Patient ID: Melanie Cordova,  MRN: [id number], DOB/AGE: January 02, 1961 51 y.o.  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  Chronic systolic heart failure- class II 2.  Hypertension 3.  GERD 4.  Obesity 5.  Hyperlipidemia 6.  Asthma 7.  Arthritis  Procedures This Admission:  1.  Implantation of a single chamber implantable cardiac defibrillator on 10-10-2011 by Dr Ladona Ridgel.  The patient received a STJ ICD with a [employer] social [employer] right ventricular lead.  DFT's at time of implant were successful at 20J.  There were no immediate complications. 2.  Chest x-ray on 10-11-2011 demonstrated no pneumothorax status post device implantation.   Brief HPI: Melanie Cordova is a 51 year old female who was referred to Dr Ladona Ridgel in the outpatient setting to consider ICD implant. She is a pleasant 51 yo woman with a non-ischemic CM, longstanding LV dysfunction with an EF 20% by echo. The patient has chronic systolic CHF, class 2 despite maximal medical therapy. No syncope. She walks to work every day but has to stop several times to catch her breath. Minimal peripheral edema. Her EF has not improved with medical therapy. She has a strong history of sudden death.  Risks, benefits, and alternatives to ICD implantation were reviewed with the patient who wished to proceed.   Hospital Course:  The patient was admitted and underwent implantation of a single chamber ICD with details as outlined above.   She was monitored on telemetry overnight which demonstrated sinus rhythm with PVC's.  Left chest was without hematoma or ecchymosis.  The device was interrogated and found to be  functioning normally.  CXR was obtained and demonstrated no pneumothorax status post device implantation.  Wound care, arm mobility, and restrictions were reviewed with the patient.  Dr Ladona Ridgel examined the patient and considered them stable for discharge to home.    Discharge Vitals: Blood pressure 104/67, pulse 82, temperature 98 F (36.7 C), temperature source Oral, resp. rate 26, height 5\' 7"  (1.702 m), weight 278 lb 3.5 oz (126.2 kg), SpO2 96.00%.    Labs:   Lab Results  Component Value Date   WBC 11.6* 10/06/2011   HGB 15.1* 10/06/2011   HCT 42.4 10/06/2011   MCV 86.5 10/06/2011   PLT 272 10/06/2011     Lab 10/06/11 1115  NA 140  K 4.3  CL 102  CO2 28  BUN 14  CREATININE 0.80  CALCIUM 9.6  PROT --  BILITOT --  ALKPHOS --  ALT --  AST --  GLUCOSE 90    Discharge Medications:  Medication List  As of 10/11/2011  9:30 AM   TAKE these medications         budesonide-formoterol 80-4.5 MCG/ACT inhaler   Commonly known as: SYMBICORT   Inhale 2 puffs into the lungs 2 (two) times daily.      carvedilol 25 MG tablet   Commonly known as: COREG   Take 50 mg by mouth 2 (two) times daily. Take 2 tablets twice daily      desloratadine 5 MG tablet   Commonly known as: CLARINEX   Take 5 mg by mouth daily.      fish oil-omega-3 fatty acids 1000 MG capsule   Take 3 g by  mouth 3 (three) times daily.      furosemide 40 MG tablet   Commonly known as: LASIX   Take 80 mg by mouth 2 (two) times daily.      gabapentin 300 MG capsule   Commonly known as: NEURONTIN   Take 300 mg by mouth at bedtime.      IRON SUPPLEMENT 325 (65 FE) MG tablet   Generic drug: ferrous sulfate   Take 325 mg by mouth daily with breakfast.      levothyroxine 300 MCG tablet   Commonly known as: SYNTHROID, LEVOTHROID   Take 300 mcg by mouth daily.      multivitamin tablet   Take 1 tablet by mouth daily.      potassium chloride SA 20 MEQ tablet   Commonly known as: K-DUR,KLOR-CON   Take 1 tablet (20  mEq total) by mouth 2 (two) times daily.      PROVENTIL HFA 108 (90 BASE) MCG/ACT inhaler   Generic drug: albuterol   Inhale 2 puffs into the lungs every 6 (six) hours as needed. For shortness of breath      quinapril 40 MG tablet   Commonly known as: ACCUPRIL   Take 1 tablet (40 mg total) by mouth at bedtime.      ranitidine 150 MG tablet   Commonly known as: ZANTAC   Take 150 mg by mouth 2 (two) times daily as needed. For gastric reflux            Disposition:  Discharge Orders    Future Appointments: Provider: Department: Dept Phone: Center:   10/20/2011 10:30 AM [phone]   01/16/2012 8:30 AM Marinus Maw, MD [facility] [phone] [id number]     Future Orders Please Complete By Expires   Diet - low sodium heart healthy      Increase activity slowly      Discharge instructions      Comments:   Please see post ICD discharge instructions. Please note work restrictions: NO lifting >10 lbs for 4 weeks.       Duration of Discharge Encounter: Greater than 30 minutes including physician time.  Signed, Gypsy Balsam, RN, BSN 10/11/2011, 9:30 AM  EP Attending  Patient seen and examined. Agree with above. Ok for discharge with usual followup.   Lewayne Bunting, M.D.

## 2011-10-11 NOTE — Progress Notes (Signed)
   ELECTROPHYSIOLOGY ROUNDING NOTE    Patient Name: Melanie Cordova Date of Encounter: 10-11-2011    SUBJECTIVE:Patient feels well.  No chest pain or shortness of breath.  Minimal incisional soreness.  S/p single chamber ICD implant 10-10-2011  TELEMETRY: Reviewed telemetry pt in sinus rhythm with frequent monomorphic PVC's Filed Vitals:   10/10/11 2027 10/10/11 2152 10/11/11 0040 10/11/11 0553  BP: 103/54 125/66 111/59 110/58  Pulse: 84  79 89  Temp: 98.4 F (36.9 C)   98 F (36.7 C)  TempSrc: Oral   Oral  Resp: 20  17 20   Height:      Weight:   278 lb 3.5 oz (126.2 kg)   SpO2: 96%  99% 97%    Intake/Output Summary (Last 24 hours) at 10/11/11 0714 Last data filed at 10/10/11 1835  Gross per 24 hour  Intake    480 ml  Output      0 ml  Net    480 ml   Radiology/Studies:  Final result pending, leads in stable position.  PHYSICAL EXAM Left chest without hematoma or ecchymosis  DEVICE INTERROGATION: Device interrogated by industry.  Lead values including impedence, sensing, threshold within normal values.    Wound care, arm mobility, shock plan reviewed with patient.  Routine follow up scheduled (wound check in [place] then device follow up in [facility]).  A/P 1. DCM 2. Chronic systolic CHF 3. S/p ICD Rec: ok to discharge. Usual followup. low sodium diet discussed and recommended.

## 2011-10-11 NOTE — Op Note (Signed)
[name], Melanie Cordova            ACCOUNT NO.:  [id number]  MEDICAL RECORD NO.:  [id number]  LOCATION:  6524                         FACILITY:  [facility]  PHYSICIAN:  Doylene Canning. Ladona Ridgel, MD    DATE OF BIRTH:  12/17/1960  DATE OF PROCEDURE:  10/10/2011 DATE OF DISCHARGE:                              OPERATIVE REPORT   PROCEDURE PERFORMED:  Insertion of a single-chamber defibrillator.  INDICATION:  Nonischemic cardiomyopathy, class II heart failure despite maximal medical therapy, ejection fraction 15-20%.  INTRODUCTION:  The patient is a 51 year old woman with a longstanding nonischemic cardiomyopathy and chronic class II heart failure despite maximal medical therapy with carvedilol and ACE inhibitor and diuretics. She is now referred for ICD implantation for primary prevention of malignant ventricular arrhythmias.  PROCEDURE:  After informed consent was obtained, the patient was taken to the diagnostic EP lab in a fasting state.  After usual preparation and draping, intravenous fentanyl and midazolam were given for sedation. A 30 mL of lidocaine was infiltrated into the left infraclavicular region.  Initial attempts to cannulate the left subclavian vein were unsuccessful.  The cephalic vein was then dissected free.  A Glidewire was advanced into it and a 9-French sheath advanced over a Glidewire into the central circulation.  The [employer] defibrillation lead, serial [id number] was advanced into the right ventricle and mapping was carried out.  On the final site on the RV apical septum, the R-waves measured 10 mV and the pacing impedance was 900 ohms with the lead actively fixed.  A large current of injury was present.  The pacing threshold was a V at 0.5 msec.  10 V pacing did not stimulate the diaphragm.  With these satisfactory parameters, the lead was secured to the subpectoral fascia with a figure-of-eight silk suture.  The sewing sleeve was secured  with silk suture.  Electrocautery was then utilized to make a subcutaneous pocket.  Antibiotic irrigation was utilized to irrigate the pocket and the electrocautery was utilized to assure hemostasis.  The [employer] single-chamber defibrillator, serial [id number] was connected to the defibrillation lead and placed back in the subcutaneous pocket where it was secured with silk suture.  Additional antibiotic irrigation was utilized to irrigate the pocket and the incision was closed with 2-0 and 3-0 Vicryl.  Benzoin and Steri-Strips were painted on the skin.  At this point, I scrubbed out of the case to supervise defibrillation threshold testing.  After the patient was more deeply sedated under my direct supervision, ventricular fibrillation was induced with a T-wave shock.  A 20-joule shock was then delivered, which terminated ventricular fibrillation and restored sinus rhythm.  No additional defibrillation threshold testing was carried out, and a pressure dressing was placed over the incision and the patient was allowed to awaken and returned to her room in satisfactory condition.  COMPLICATIONS:  There were no immediate procedure complications.  RESULTS:  This demonstrates successful implantation of a [employer] single- chamber, single-coiled defibrillator in a patient with a longstanding nonischemic cardiomyopathy and chronic systolic heart failure.     Doylene Canning. Ladona Ridgel, MD     GWT/MEDQ  D:  10/10/2011  T:  10/11/2011  Job:  [id number]  cc:   Gerrit Friends. Dietrich Pates, MD, [facility]

## 2011-10-11 NOTE — Plan of Care (Signed)
Problem: Discharge Progression Outcomes Goal: No evidence of pacemaker malfunction Outcome: Completed/Met Date Met:  10/11/11 Pacemaker without malfunction and interrogated by pacemaker representative. Chest xray completed this am.  Goal: Site without bleeding, hematoma, S/S infection Outcome: Completed/Met Date Met:  10/11/11 Dressing dry and intact Goal: Discharge plan in place and appropriate Outcome: Completed/Met Date Met:  10/11/11 Discharge home today Goal: Pain controlled with appropriate interventions Outcome: Completed/Met Date Met:  10/11/11 Painfree Goal: Hemodynamically stable Outcome: Completed/Met Date Met:  10/11/11 VSS Goal: Tolerating diet Outcome: Completed/Met Date Met:  10/11/11 Ate all breakfast and tolerated well Goal: Activity appropriate for discharge plan Outcome: Completed/Met Date Met:  10/11/11 Ambulating in room and hall with no s/s intolerance

## 2011-10-11 NOTE — Plan of Care (Signed)
Problem: Phase III Progression Outcomes Goal: Pain controlled on oral analgesia Outcome: Completed/Met Date Met:  10/11/11 Resolved with tylenol Goal: Tolerating diet Outcome: Completed/Met Date Met:  10/11/11 Tolerated breakfast Goal: Hemodynamically stable Outcome: Completed/Met Date Met:  10/11/11 VSS Goal: Ambulating in room or hall Outcome: Completed/Met Date Met:  10/11/11 Ambulating with no s/s intolerance Goal: Limited arm movement per orders Outcome: Completed/Met Date Met:  10/11/11 Limiting arm movements as instructed, verbalizes understanding Goal: Discharge plan remains appropriate-arrangements made Outcome: Completed/Met Date Met:  10/11/11 Discharge home today.

## 2011-10-12 ENCOUNTER — Emergency Department (HOSPITAL_COMMUNITY)
Admission: EM | Admit: 2011-10-12 | Discharge: 2011-10-12 | Disposition: A | Discharge status: Home / Self Care | Payer: Medicaid Other | Attending: Emergency Medicine | Admitting: Emergency Medicine

## 2011-10-12 ENCOUNTER — Encounter: Payer: Self-pay | Admitting: *Deleted

## 2011-10-12 ENCOUNTER — Encounter (HOSPITAL_COMMUNITY): Payer: Self-pay | Admitting: *Deleted

## 2011-10-12 ENCOUNTER — Emergency Department (HOSPITAL_COMMUNITY): Payer: Medicaid Other

## 2011-10-12 DIAGNOSIS — F17200 Nicotine dependence, unspecified, uncomplicated: Secondary | ICD-10-CM | POA: Insufficient documentation

## 2011-10-12 DIAGNOSIS — I428 Other cardiomyopathies: Secondary | ICD-10-CM | POA: Insufficient documentation

## 2011-10-12 DIAGNOSIS — Z8739 Personal history of other diseases of the musculoskeletal system and connective tissue: Secondary | ICD-10-CM | POA: Insufficient documentation

## 2011-10-12 DIAGNOSIS — R0789 Other chest pain: Secondary | ICD-10-CM | POA: Insufficient documentation

## 2011-10-12 DIAGNOSIS — I10 Essential (primary) hypertension: Secondary | ICD-10-CM | POA: Insufficient documentation

## 2011-10-12 DIAGNOSIS — E785 Hyperlipidemia, unspecified: Secondary | ICD-10-CM | POA: Insufficient documentation

## 2011-10-12 DIAGNOSIS — I509 Heart failure, unspecified: Secondary | ICD-10-CM | POA: Insufficient documentation

## 2011-10-12 DIAGNOSIS — Z95810 Presence of automatic (implantable) cardiac defibrillator: Secondary | ICD-10-CM | POA: Insufficient documentation

## 2011-10-12 DIAGNOSIS — Z79899 Other long term (current) drug therapy: Secondary | ICD-10-CM | POA: Insufficient documentation

## 2011-10-12 DIAGNOSIS — J45909 Unspecified asthma, uncomplicated: Secondary | ICD-10-CM | POA: Insufficient documentation

## 2011-10-12 LAB — BASIC METABOLIC PANEL
BUN: 10 mg/dL (ref 6–23)
Chloride: 102 mEq/L (ref 96–112)
Creatinine, Ser: 0.62 mg/dL (ref 0.50–1.10)
GFR calc Af Amer: >90 mL/min (ref >90)
GFR calc non Af Amer: >90 mL/min (ref >90)
Glucose, Bld: 96 mg/dL (ref 70–99)

## 2011-10-12 LAB — CBC WITH DIFFERENTIAL/PLATELET
Basophils Relative: 0 % (ref 0–1)
Eosinophils Absolute: 0.2 10*3/uL (ref 0.0–0.7)
HCT: 40.2 % (ref 36.0–46.0)
Hemoglobin: 13.2 g/dL (ref 12.0–15.0)
MCH: 30.3 pg (ref 26.0–34.0)
MCHC: 32.8 g/dL (ref 30.0–36.0)
Monocytes Absolute: 0.6 10*3/uL (ref 0.1–1.0)
Monocytes Relative: 7 % (ref 3–12)

## 2011-10-12 NOTE — ED Notes (Signed)
Pt reportedly with new defibrillator placed on Monday by Dr. Ladona Ridgel. Today defibrillator fired twice. Patient was smoking. Pt denied chest pain, palpatations or SOB prior to defib firing. Denies pain.

## 2011-10-12 NOTE — ED Notes (Signed)
D/c instructions reviewed w/ pt and family - pt and family deny any further questions or concerns at present. Pt ambulating independently w/ steady gait on d/c in no acute distress, A&Ox4.  

## 2011-10-12 NOTE — ED Provider Notes (Signed)
History     CSN: [id number]  Arrival date & time 10/12/11  1405   First MD Initiated Contact with Patient 10/12/11 1415      Chief Complaint  Patient presents with  . AICD Problem    (Consider location/radiation/quality/duration/timing/severity/associated sxs/prior treatment) The history is provided by the patient.   patient and he presented because she thinks her defibrillator fired twice today. She states she's. She states she felt sweaty after the first episode. She described as burning in her chest. When asked later she felt like she been kicked he said the first one day. No lightheadedness or dizziness. The AICD was placed 2 days ago for a poor ejection fraction. She's not had it fire previously. No fevers. She states she feels better now.  Past Medical History  Diagnosis Date  . Hypertension     09/2010-normal CMet and CBC; Lipid profile-116, 88, 25, 73  . Cardiomyopathy 08/2010    Presented with congestive heart failure; EF of 15% and 2012; hypotension on medication precludes optimal dosing  . Gastroesophageal reflux disease   . Hypothyroidism     Recent TSH was normal.  . Pneumonia   . Obesity   . Hyperlipidemia   . Tobacco abuse     20 pack years  . CHF (congestive heart failure)   . Asthma   . Shortness of breath   . Seizures   . Headache   . Arthritis   . Anxiety   . Neuropathy   . 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 Cesarean section     X2    Family History  Problem Relation Age of Onset  . Cardiomyopathy Mother     ICD pacemaker-ischmic CM  . Heart failure Mother   . Hypertension Mother   . Cardiomyopathy Father     Deceased  . Coronary artery disease Father   . Heart failure Father   . Heart failure Brother   . Hypertension Brother     History  Substance Use Topics  . Smoking status: Current Everyday Smoker -- 0.5 packs/day for 21 years    Types: Cigarettes  . Smokeless  tobacco: Never Used  . Alcohol Use: No    OB History    Grav Para Term Preterm Abortions TAB SAB Ect Mult Living                  Review of Systems  Constitutional: Positive for diaphoresis. Negative for activity change and appetite change.  HENT: Negative for neck stiffness.   Eyes: Negative for pain.  Respiratory: Negative for chest tightness and shortness of breath.   Cardiovascular: Negative for chest pain, palpitations and leg swelling.  Gastrointestinal: Negative for nausea, vomiting, abdominal pain and diarrhea.  Genitourinary: Negative for flank pain.  Musculoskeletal: Negative for back pain.  Skin: Negative for rash.  Neurological: Negative for weakness, numbness and headaches.  Psychiatric/Behavioral: Negative for behavioral problems.    Allergies  Penicillins; Iodinated diagnostic agents; Iodine; Sulfa antibiotics; and Spironolactone  Home Medications   Current Outpatient Rx  Name Route Sig Dispense Refill  . ALBUTEROL SULFATE HFA 108 (90 BASE) MCG/ACT IN AERS Inhalation Inhale 2 puffs into the lungs every 6 (six) hours as needed. For shortness of breath    . BUDESONIDE-FORMOTEROL FUMARATE 80-4.5 MCG/ACT IN AERO Inhalation Inhale 2 puffs into the lungs 2 (two) times daily.    Marland Kitchen CARVEDILOL 25 MG PO TABS Oral Take 50 mg  by mouth 2 (two) times daily. Take 2 tablets twice daily    . DESLORATADINE 5 MG PO TABS Oral Take 5 mg by mouth daily.    Marland Kitchen FERROUS SULFATE 325 (65 FE) MG PO TABS Oral Take 325 mg by mouth daily with breakfast.    . OMEGA-3 FATTY ACIDS 1000 MG PO CAPS Oral Take 3 g by mouth 3 (three) times daily.      . FUROSEMIDE 40 MG PO TABS Oral Take 80 mg by mouth 2 (two) times daily.    Marland Kitchen GABAPENTIN 300 MG PO CAPS Oral Take 300 mg by mouth at bedtime.     Marland Kitchen LEVOTHYROXINE SODIUM 300 MCG PO TABS Oral Take 300 mcg by mouth daily.      Marland Kitchen ONE-DAILY MULTI VITAMINS PO TABS Oral Take 1 tablet by mouth daily.    Marland Kitchen POTASSIUM CHLORIDE CRYS ER 20 MEQ PO TBCR Oral Take 1  tablet (20 mEq total) by mouth 2 (two) times daily. 60 tablet 0  . QUINAPRIL HCL 40 MG PO TABS Oral Take 1 tablet (40 mg total) by mouth at bedtime. 30 tablet 12  . RANITIDINE HCL 150 MG PO TABS Oral Take 150 mg by mouth 2 (two) times daily as needed. For gastric reflux      BP 121/73  Temp 98.3 F (36.8 C) (Oral)  Resp 20  SpO2 98%  Physical Exam  Nursing note and vitals reviewed. Constitutional: She is oriented to person, place, and time. She appears well-developed and well-nourished.  HENT:  Head: Normocephalic and atraumatic.  Eyes: EOM are normal. Pupils are equal, round, and reactive to light.  Neck: Normal range of motion. Neck supple.  Cardiovascular: Normal rate, regular rhythm and normal heart sounds.   No murmur heard. Pulmonary/Chest: Effort normal and breath sounds normal. No respiratory distress. She has no wheezes. She has no rales.       New AICD pocket to left upper chest. Well-healing.  Abdominal: Soft. Bowel sounds are normal. She exhibits no distension. There is no tenderness. There is no rebound and no guarding.  Musculoskeletal: Normal range of motion.  Neurological: She is alert and oriented to person, place, and time. No cranial nerve deficit.  Skin: Skin is warm and dry.  Psychiatric: She has a normal mood and affect. Her speech is normal.    ED Course  Procedures (including critical care time)   Labs Reviewed  CBC WITH DIFFERENTIAL  BASIC METABOLIC PANEL  MAGNESIUM  TROPONIN I   Dg Chest 2 View  10/12/2011  *RADIOLOGY REPORT*  Clinical Data: AICD activation.  Chest pain and burning sensation.  CHEST - 2 VIEW  Comparison: Two-view chest 10/11/2011.  Findings: A single lead AICD is stable position.  The heart is mildly enlarged.  The lungs are clear.  Emphysematous changes are present.  Mild degenerative changes in the thoracic spine are stable.  IMPRESSION:  1.  No acute cardiopulmonary disease or significant interval change. 2.  Stable cardiomegaly  without failure. 3.  Emphysema.  Original Report Authenticated By: Jamesetta Orleans. MATTERN, M.D.   Dg Chest 2 View  10/11/2011  *RADIOLOGY REPORT*  Clinical Data: Placement of defibrillator, some shortness of breath and chest soreness  CHEST - 2 VIEW  Comparison: Portable chest x-ray of 10/01/2011  Findings: The lungs are clear.  Mild cardiomegaly is stable.  A permanent pacemaker with AICD lead is now present.  No bony abnormality is seen.  IMPRESSION: Stable cardiomegaly.  Permanent pacer with AICD lead.  Original Report Authenticated By: Juline Patch, M.D.     1. ICD -St.Jude      Date: 10/12/2011  Rate: 72  Rhythm: normal sinus rhythm  QRS Axis: normal  Intervals: normal  ST/T Wave abnormalities: nonspecific ST/T changes  Conduction Disutrbances:nonspecific intraventricular conduction delay  Narrative Interpretation:   Old EKG Reviewed: unchanged   MDM  Patient believes that her AICD fired. It is new in his place 2 days ago. She states it felt like burning. AICD was interrogated and it did not fire and there was no evidence. Labs reassuring and patient be discharged home.        Juliet Rude. Rubin Payor, MD 10/12/11 1535

## 2011-10-14 NOTE — Progress Notes (Signed)
UR chart review completed.  

## 2011-10-20 ENCOUNTER — Ambulatory Visit (INDEPENDENT_AMBULATORY_CARE_PROVIDER_SITE_OTHER): Payer: Self-pay | Admitting: *Deleted

## 2011-10-20 ENCOUNTER — Encounter: Payer: Self-pay | Admitting: Internal Medicine

## 2011-10-20 DIAGNOSIS — I428 Other cardiomyopathies: Secondary | ICD-10-CM

## 2011-10-20 DIAGNOSIS — I5022 Chronic systolic (congestive) heart failure: Secondary | ICD-10-CM

## 2011-10-20 DIAGNOSIS — I429 Cardiomyopathy, unspecified: Secondary | ICD-10-CM

## 2011-10-20 LAB — ICD DEVICE OBSERVATION
DEV-0020ICD: NEGATIVE
DEVICE MODEL ICD: 1022442
FVT: 0
RV LEAD AMPLITUDE: 12 mv
RV LEAD IMPEDENCE ICD: 387.5 Ohm
RV LEAD THRESHOLD: 0.5 V
TOT-0008: 0
TOT-0010: 1

## 2011-10-20 NOTE — Progress Notes (Signed)
Wound check-ICD 

## 2011-11-06 ENCOUNTER — Emergency Department (HOSPITAL_COMMUNITY): Payer: Medicaid Other

## 2011-11-06 ENCOUNTER — Encounter (HOSPITAL_COMMUNITY): Payer: Self-pay | Admitting: *Deleted

## 2011-11-06 ENCOUNTER — Observation Stay (HOSPITAL_COMMUNITY)
Admission: EM | Admit: 2011-11-06 | Discharge: 2011-11-07 | Disposition: A | Discharge status: Home / Self Care | Payer: Medicaid Other | Attending: Internal Medicine | Admitting: Internal Medicine

## 2011-11-06 DIAGNOSIS — E039 Hypothyroidism, unspecified: Secondary | ICD-10-CM

## 2011-11-06 DIAGNOSIS — I429 Cardiomyopathy, unspecified: Secondary | ICD-10-CM

## 2011-11-06 DIAGNOSIS — Z95810 Presence of automatic (implantable) cardiac defibrillator: Secondary | ICD-10-CM

## 2011-11-06 DIAGNOSIS — K219 Gastro-esophageal reflux disease without esophagitis: Secondary | ICD-10-CM

## 2011-11-06 DIAGNOSIS — I428 Other cardiomyopathies: Secondary | ICD-10-CM | POA: Insufficient documentation

## 2011-11-06 DIAGNOSIS — E876 Hypokalemia: Secondary | ICD-10-CM

## 2011-11-06 DIAGNOSIS — R079 Chest pain, unspecified: Secondary | ICD-10-CM | POA: Insufficient documentation

## 2011-11-06 DIAGNOSIS — G4700 Insomnia, unspecified: Secondary | ICD-10-CM

## 2011-11-06 DIAGNOSIS — G459 Transient cerebral ischemic attack, unspecified: Secondary | ICD-10-CM

## 2011-11-06 DIAGNOSIS — E785 Hyperlipidemia, unspecified: Secondary | ICD-10-CM | POA: Insufficient documentation

## 2011-11-06 DIAGNOSIS — I10 Essential (primary) hypertension: Secondary | ICD-10-CM | POA: Insufficient documentation

## 2011-11-06 DIAGNOSIS — Z6841 Body Mass Index (BMI) 40.0 and over, adult: Secondary | ICD-10-CM | POA: Insufficient documentation

## 2011-11-06 DIAGNOSIS — Z720 Tobacco use: Secondary | ICD-10-CM

## 2011-11-06 DIAGNOSIS — I5022 Chronic systolic (congestive) heart failure: Secondary | ICD-10-CM

## 2011-11-06 DIAGNOSIS — E669 Obesity, unspecified: Secondary | ICD-10-CM | POA: Insufficient documentation

## 2011-11-06 DIAGNOSIS — R42 Dizziness and giddiness: Principal | ICD-10-CM | POA: Insufficient documentation

## 2011-11-06 LAB — RAPID URINE DRUG SCREEN, HOSP PERFORMED
Barbiturates: NOT DETECTED
Benzodiazepines: NOT DETECTED
Cocaine: NOT DETECTED
Opiates: NOT DETECTED
Tetrahydrocannabinol: NOT DETECTED

## 2011-11-06 LAB — CK TOTAL AND CKMB (NOT AT ARMC)
CK, MB: 1.5 ng/mL (ref 0.3–4.0)
Relative Index: INVALID (ref 0.0–2.5)
Total CK: 49 U/L (ref 7–177)

## 2011-11-06 LAB — HEPATIC FUNCTION PANEL
Alkaline Phosphatase: 99 U/L (ref 39–117)
Bilirubin, Direct: 0.1 mg/dL (ref 0.0–0.3)
Total Bilirubin: 0.3 mg/dL (ref 0.3–1.2)

## 2011-11-06 LAB — BASIC METABOLIC PANEL
Calcium: 8.9 mg/dL (ref 8.4–10.5)
Creatinine, Ser: 0.85 mg/dL (ref 0.50–1.10)
GFR calc Af Amer: >90 mL/min (ref >90)
GFR calc non Af Amer: 79 mL/min — ABNORMAL LOW (ref >90)
Sodium: 137 mEq/L (ref 135–145)

## 2011-11-06 LAB — PROTIME-INR
INR: 0.89 (ref 0.00–1.49)
Prothrombin Time: 12.2 s (ref 11.6–15.2)

## 2011-11-06 LAB — CBC WITH DIFFERENTIAL/PLATELET
Basophils Absolute: 0.1 10*3/uL (ref 0.0–0.1)
Basophils Relative: 1 % (ref 0–1)
Eosinophils Absolute: 0.4 10*3/uL (ref 0.0–0.7)
Eosinophils Relative: 4 % (ref 0–5)
MCH: 31 pg (ref 26.0–34.0)
MCHC: 33.7 g/dL (ref 30.0–36.0)
MCV: 92.1 fL (ref 78.0–100.0)
Neutrophils Relative %: 64 % (ref 43–77)
Platelets: 220 10*3/uL (ref 150–400)
RDW: 13.9 % (ref 11.5–15.5)

## 2011-11-06 LAB — TROPONIN I: Troponin I: <0.3 ng/mL (ref <0.30)

## 2011-11-06 LAB — ETHANOL: Alcohol, Ethyl (B): <11 mg/dL (ref 0–11)

## 2011-11-06 LAB — GLUCOSE, CAPILLARY: Glucose-Capillary: 94 mg/dL (ref 70–99)

## 2011-11-06 MED ORDER — POTASSIUM CHLORIDE IN NACL 20-0.9 MEQ/L-% IV SOLN
INTRAVENOUS | Status: AC
  Administered 2011-11-06: 1000 mL via INTRAVENOUS
  Filled 2011-11-06: qty 1000

## 2011-11-06 MED ORDER — GABAPENTIN 300 MG PO CAPS
ORAL_CAPSULE | ORAL | Status: AC
  Filled 2011-11-06: qty 1

## 2011-11-06 MED ORDER — ASPIRIN 81 MG PO CHEW
324.0000 mg | CHEWABLE_TABLET | Freq: Once | ORAL | Status: AC
  Administered 2011-11-06: 324 mg via ORAL
  Filled 2011-11-06: qty 4

## 2011-11-06 MED ORDER — ONDANSETRON HCL 4 MG/2ML IJ SOLN: 4.0000 mg | Freq: Three times a day (TID) | INTRAMUSCULAR | Status: DC | PRN

## 2011-11-06 MED ORDER — ENOXAPARIN SODIUM 40 MG/0.4ML ~~LOC~~ SOLN
40.0000 mg | SUBCUTANEOUS | Status: DC
  Administered 2011-11-06: 40 mg via SUBCUTANEOUS
  Filled 2011-11-06: qty 0.4

## 2011-11-06 MED ORDER — NITROGLYCERIN 0.4 MG SL SUBL
0.4000 mg | SUBLINGUAL_TABLET | SUBLINGUAL | Status: DC | PRN
  Administered 2011-11-06 (×2): 0.4 mg via SUBLINGUAL
  Filled 2011-11-06: qty 25

## 2011-11-06 MED ORDER — POTASSIUM CHLORIDE CRYS ER 20 MEQ PO TBCR
40.0000 meq | EXTENDED_RELEASE_TABLET | Freq: Once | ORAL | Status: AC
  Administered 2011-11-06: 40 meq via ORAL
  Filled 2011-11-06: qty 2

## 2011-11-06 MED ORDER — GABAPENTIN 300 MG PO CAPS
300.0000 mg | ORAL_CAPSULE | Freq: Every day | ORAL | Status: DC
  Administered 2011-11-06: 300 mg via ORAL
  Filled 2011-11-06 (×2): qty 1

## 2011-11-06 MED ORDER — FAMOTIDINE 20 MG PO TABS
20.0000 mg | ORAL_TABLET | Freq: Two times a day (BID) | ORAL | Status: DC
  Administered 2011-11-06 – 2011-11-07 (×2): 20 mg via ORAL
  Filled 2011-11-06 (×2): qty 1

## 2011-11-06 NOTE — ED Notes (Signed)
Pt brought to er by [facility] due to c/o chest pain, bradycardia, ems reports that pt was in a bigeminy  rhythm on their arrival. Pt reports that the chest pain started today feels like someone has a brick on their chest and it feels like it is being pushed down, burning sensation to chest area. Pt also reports slurred speech this afternoon when the chest pain started at 2:30pm today associated with dizziness with room spinning. Denies any n/v, admits to sob. Pain is non radiating located on left side.

## 2011-11-06 NOTE — H&P (Addendum)
[facility] History and Physical  Melanie Cordova  ZOX:[id number]  DOB: [birth year]   DOA: 11/06/2011    PCP:   [name], NP  Cardiologist: Dr. Sharrell Ku M.D.  Chief Complaint:  Dizziness and vertigo since this afternoon  HPI: Melanie Cordova is an 51 y.o. female. Obese Caucasian lady multiple medical problems including none ischemic, cardiomyopathy ejection fraction about 20%, systolic heart failure, status post AICD placement about a month ago, hypertension, GERD, hypothyroidism, and tobacco abuse, reports that she woke up from rest at about 2:30 today and when she stood up she immediately felt a placed spinning, felt dizzy and lightheaded, and her boyfriend helped her back onto the bed. She rested for further one hour and during that time felt fine, but when she got up again and she again felt somewhat lightheaded and then had sudden onset of a a burning left-sided chest pain.  Boyfriend also noted that her speech became very slurred, but this resolved after some minutes. She had no focal weakness.  There was no associated nausea vomiting or diaphoresis;  EMS was called and she was brought to the emergency room;   review of medical record shows her blood pressures are typically 120/70 takes 160 mg of Lasix everyday, and has been walking in the summer heat every day for the past few days  Rewiew of Systems:   All systems negative except as marked bold or noted in the HPI;  Constitutional: Negative for malaise, fever and chills. ;  Eyes: Negative for eye pain, redness and discharge. ;  ENMT: Negative for ear pain, hoarseness, nasal congestion, sinus pressure and sore throat. ;  Cardiovascular: Negative for palpitations, diaphoresis, dyspnea and peripheral edema. ;  Respiratory: Negative for cough, hemoptysis, wheezing and stridor. ;  Gastrointestinal: Negative for nausea, vomiting, diarrhea, constipation, abdominal pain, melena, blood in stool, hematemesis, jaundice and  rectal bleeding. unusual weight loss..  Genitourinary: Negative for frequency, dysuria, incontinence,flank pain and hematuria;  Musculoskeletal: Negative for back pain and neck pain. Negative for swelling and trauma.;  Skin: . Negative for pruritus, rash, abrasions, bruising and skin lesion.; ulcerations  Neuro: Negative for headache or neck stiffness. Negative for weakness, altered level of consciousness , altered mental status, extremity weakness, burning feet, involuntary movement, seizure and syncope.  Psych: negative for anxiety, depression, insomnia, tearfulness, panic attacks, hallucinations, paranoia, suicidal or homicidal ideation    Past Medical History  Diagnosis Date  . Hypertension     09/2010-normal CMet and CBC; Lipid profile-116, 88, 25, 73  . Cardiomyopathy 08/2010    Presented with congestive heart failure; EF of 15% and 2012; hypotension on medication precludes optimal dosing  . Gastroesophageal reflux disease   . Hypothyroidism     Recent TSH was normal.  . Pneumonia   . Obesity   . Hyperlipidemia   . Tobacco abuse     20 pack years  . CHF (congestive heart failure)   . Asthma   . Shortness of breath   . Seizures   . Headache   . Arthritis   . Anxiety   . Neuropathy   . 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 Cesarean section     X2    Medications:  HOME MEDS: Prior to Admission medications   Medication Sig Start Date End Date Taking? Authorizing Provider  albuterol (PROVENTIL HFA) 108 (90 BASE) MCG/ACT inhaler Inhale 2 puffs into the lungs every 6 (six)  hours as needed. For shortness of breath   Yes Historical Provider, MD  budesonide-formoterol (SYMBICORT) 80-4.5 MCG/ACT inhaler Inhale 2 puffs into the lungs 2 (two) times daily.   Yes Historical Provider, MD  carvedilol (COREG) 25 MG tablet Take 50 mg by mouth 2 (two) times daily. Take 2 tablets twice daily 01/14/11  Yes Kathlen Brunswick,  MD  desloratadine (CLARINEX) 5 MG tablet Take 5 mg by mouth daily.   Yes Historical Provider, MD  ferrous sulfate (IRON SUPPLEMENT) 325 (65 FE) MG tablet Take 325 mg by mouth daily with breakfast.   Yes Historical Provider, MD  fish oil-omega-3 fatty acids 1000 MG capsule Take 3 g by mouth 3 (three) times daily.     Yes Historical Provider, MD  furosemide (LASIX) 40 MG tablet Take 80 mg by mouth 2 (two) times daily.   Yes Historical Provider, MD  gabapentin (NEURONTIN) 300 MG capsule Take 300 mg by mouth at bedtime.    Yes Historical Provider, MD  levothyroxine (SYNTHROID, LEVOTHROID) 300 MCG tablet Take 300 mcg by mouth daily.     Yes Historical Provider, MD  Multiple Vitamin (MULTIVITAMIN) tablet Take 1 tablet by mouth daily.   Yes Historical Provider, MD  potassium chloride SA (K-DUR,KLOR-CON) 20 MEQ tablet Take 1 tablet (20 mEq total) by mouth 2 (two) times daily. 10/02/11 10/01/12 Yes Erick Blinks, MD  quinapril (ACCUPRIL) 40 MG tablet Take 1 tablet (40 mg total) by mouth at bedtime. 08/01/11 07/31/12 Yes Kathlen Brunswick, MD  ranitidine (ZANTAC) 150 MG tablet Take 150 mg by mouth 2 (two) times daily as needed. For gastric reflux   Yes Historical Provider, MD     Allergies:  Allergies  Allergen Reactions  . Penicillins Anaphylaxis and Shortness Of Breath    Hair loss  . Iodinated Diagnostic Agents Hives and Other (See Comments)    Pulmonary problems; no frank respiratory arrest  . Iodine   . Sulfa Antibiotics Other (See Comments)    Pt can't remember reaction  . Spironolactone Rash    Social History:   reports that she has been smoking Cigarettes.  She has a 10.5 pack-year smoking history. She has never used smokeless tobacco. She reports that she does not drink alcohol or use illicit drugs.  Family History: Family History  Problem Relation Age of Onset  . Cardiomyopathy Mother     ICD pacemaker-ischmic CM  . Heart failure Mother   . Hypertension Mother   . Cardiomyopathy  Father     Deceased  . Coronary artery disease Father   . Heart failure Father   . Heart failure Brother   . Hypertension Brother      Physical Exam: Filed Vitals:   11/06/11 1900 11/06/11 2011 11/06/11 2026 11/06/11 2100  BP: 101/62 89/58 104/59 98/59  Pulse: 70 74  64  Temp:      TempSrc:      Resp: 21   22  Height:      Weight:      SpO2: 94% 97%  98%   Blood pressure 98/59, pulse 64, temperature 98.1 F (36.7 C), temperature source Oral, resp. rate 22, height 5' 7.5" (1.715 m), weight 121.564 kg (268 lb), SpO2 98.00%.  GEN:  Pleasant, obese Caucasian lady reclining  in the stretcher;  Jovial, cooperative with exam PSYCH:  alert and oriented x4; does not appear anxious or depressed; affect is appropriate. HEENT: Mucous membranes pink and anicteric;  PERRLA; EOM intact; multiple careous teeth; ; no cervical lymphadenopathy  nor thyromegaly or carotid bruit; no JVD; the neck  Breasts:: Not examined CHEST WALL: No tenderness; ICD in left upper chest; under healing surgical scar; CHEST: Normal respiration, clear to auscultation bilaterally HEART: Regular rate and rhythm; no murmurs rubs or gallops BACK:  no CVA tenderness ABDOMEN: Obese, soft non-tender; no masses, no organomegaly, normal abdominal bowel sounds; moderate pannus; no intertriginous candida. Rectal Exam: Not done EXTREMITIES: No bone or joint deformity;  no edema; no ulcerations. Genitalia: not examined PULSES: 2+ and symmetric SKIN: Normal hydration no rash or ulceration CNS: Cranial nerves 2-12 grossly intact no focal lateralizing neurologic deficit   Labs on Admission:  Basic Metabolic Panel:  Lab 11/06/11 1610  NA 137  K 3.0*  CL 99  CO2 31  GLUCOSE 94  BUN 12  CREATININE 0.85  CALCIUM 8.9  MG --  PHOS --   Liver Function Tests:  Lab 11/06/11 1801  AST 15  ALT 19  ALKPHOS 99  BILITOT 0.3  PROT 6.4  ALBUMIN 3.4*   No results found for this basename: LIPASE:5,AMYLASE:5 in the last 168  hours No results found for this basename: AMMONIA:5 in the last 168 hours CBC:  Lab 11/06/11 1801  WBC 11.2*  NEUTROABS 7.2  HGB 13.4  HCT 39.8  MCV 92.1  PLT 220   Cardiac Enzymes:  Lab 11/06/11 1801  CKTOTAL 49  CKMB 1.5  CKMBINDEX --  TROPONINI <0.30   BNP: No components found with this basename: POCBNP:5 CBG:  Lab 11/06/11 1903  GLUCAP 94    Results for orders placed during the hospital encounter of 11/06/11 (from the past 48 hour(s))  CBC WITH DIFFERENTIAL     Status: Abnormal   Collection Time   11/06/11  6:01 PM      Component Value Range Comment   WBC 11.2 (*) 4.0 - 10.5 K/uL    RBC 4.32  3.87 - 5.11 MIL/uL    Hemoglobin 13.4  12.0 - 15.0 g/dL    HCT 96.0  45.4 - 09.8 %    MCV 92.1  78.0 - 100.0 fL    MCH 31.0  26.0 - 34.0 pg    MCHC 33.7  30.0 - 36.0 g/dL    RDW 11.9  14.7 - 82.9 %    Platelets 220  150 - 400 K/uL    Neutrophils Relative 64  43 - 77 %    Neutro Abs 7.2  1.7 - 7.7 K/uL    Lymphocytes Relative 25  12 - 46 %    Lymphs Abs 2.8  0.7 - 4.0 K/uL    Monocytes Relative 7  3 - 12 %    Monocytes Absolute 0.7  0.1 - 1.0 K/uL    Eosinophils Relative 4  0 - 5 %    Eosinophils Absolute 0.4  0.0 - 0.7 K/uL    Basophils Relative 1  0 - 1 %    Basophils Absolute 0.1  0.0 - 0.1 K/uL   BASIC METABOLIC PANEL     Status: Abnormal   Collection Time   11/06/11  6:01 PM      Component Value Range Comment   Sodium 137  135 - 145 mEq/L    Potassium 3.0 (*) 3.5 - 5.1 mEq/L    Chloride 99  96 - 112 mEq/L    CO2 31  19 - 32 mEq/L    Glucose, Bld 94  70 - 99 mg/dL    BUN 12  6 - 23 mg/dL  Creatinine, Ser 0.85  0.50 - 1.10 mg/dL    Calcium 8.9  8.4 - 16.1 mg/dL    GFR calc non Af Amer 79 (*) >90 mL/min    GFR calc Af Amer >90  >90 mL/min   TROPONIN I     Status: Normal   Collection Time   11/06/11  6:01 PM      Component Value Range Comment   Troponin I <0.30  <0.30 ng/mL   PROTIME-INR     Status: Normal   Collection Time   11/06/11  6:01 PM       Component Value Range Comment   Prothrombin Time 12.2  11.6 - 15.2 seconds    INR 0.89  0.00 - 1.49   APTT     Status: Normal   Collection Time   11/06/11  6:01 PM      Component Value Range Comment   aPTT 24  24 - 37 seconds   CK TOTAL AND CKMB     Status: Normal   Collection Time   11/06/11  6:01 PM      Component Value Range Comment   Total CK 49  7 - 177 U/L    CK, MB 1.5  0.3 - 4.0 ng/mL    Relative Index RELATIVE INDEX IS INVALID  0.0 - 2.5   ETHANOL     Status: Normal   Collection Time   11/06/11  6:01 PM      Component Value Range Comment   Alcohol, Ethyl (B) <11  0 - 11 mg/dL   HEPATIC FUNCTION PANEL     Status: Abnormal   Collection Time   11/06/11  6:01 PM      Component Value Range Comment   Total Protein 6.4  6.0 - 8.3 g/dL    Albumin 3.4 (*) 3.5 - 5.2 g/dL    AST 15  0 - 37 U/L    ALT 19  0 - 35 U/L    Alkaline Phosphatase 99  39 - 117 U/L    Total Bilirubin 0.3  0.3 - 1.2 mg/dL    Bilirubin, Direct 0.1  0.0 - 0.3 mg/dL    Indirect Bilirubin 0.2 (*) 0.3 - 0.9 mg/dL   GLUCOSE, CAPILLARY     Status: Normal   Collection Time   11/06/11  7:03 PM      Component Value Range Comment   Glucose-Capillary 94  70 - 99 mg/dL   URINE RAPID DRUG SCREEN (HOSP PERFORMED)     Status: Normal   Collection Time   11/06/11  8:40 PM      Component Value Range Comment   Opiates NONE DETECTED  NONE DETECTED    Cocaine NONE DETECTED  NONE DETECTED    Benzodiazepines NONE DETECTED  NONE DETECTED    Amphetamines NONE DETECTED  NONE DETECTED    Tetrahydrocannabinol NONE DETECTED  NONE DETECTED    Barbiturates NONE DETECTED  NONE DETECTED      Radiological Exams on Admission: Ct Head Wo Contrast  11/06/2011  *RADIOLOGY REPORT*  Clinical Data: Altered level of consciousness.  CT HEAD WITHOUT CONTRAST  Technique:  Contiguous axial images were obtained from the base of the skull through the vertex without contrast.  Comparison: Brain MRI and head CT 11/17/2010.  Findings: The brain  appears normal without evidence of infarct, hemorrhage, mass, mass effect, midline shift or abnormal extra- axial fluid collection.  No hydrocephalus or pneumocephalus.  IMPRESSION: Negative exam.   Original Report Authenticated By:  THOMAS L. D'ALESSIO, M.D.    Dg Chest Portable 1 View  11/06/2011  *RADIOLOGY REPORT*  Clinical Data: Chest pain, bradycardia  PORTABLE CHEST - 1 VIEW  Comparison: 10/12/2011  Findings: Lungs are essentially clear. No pleural effusion or pneumothorax.  Cardiomediastinal silhouette is within normal limits.  Left subclavian ICD.  IMPRESSION: No evidence of acute cardiopulmonary disease.   Original Report Authenticated By: Charline Bills, M.D.     EKG: Independently reviewed. Normal sinus rhythm; T wave inversion in anterolateral leads. Assessment/Plan Present on Admission:  .Chest pain .Dizziness Hypotension  Hypokalemia  .Obesity  .Hypertension .Cardiomyopathy .Gastroesophageal reflux disease .Hypothyroidism .Tobacco abuse .Hyperlipidemia .ICD -St.[facility]ude .Hypokalemia .Chronic systolic heart failure   PLAN: Hypokalemia, hypotension and dizziness, suggest dehydration as a cause of orthostatic symptoms, and could lead to TIA symptoms, andischemic symptoms form poor flow. We'll admit this lady on observation TIA protocol, and also check cardiac enzymes to r/o ACS.  We'll hydrate her cautiously, and hold her Lasix for tonight.   counseled on nicotine cessation, and weight management.  Other plans as per orders.  Code Status: FULL CODE  Family Communication: Jobe Igo, [phone]) Daughter Remigio Eisenmenger [phone]) [phone])  Disposition Plan: If no untoward events overnight, consider discussion with cardiology or cardiology for possible medication adjustments in the summertime.   Adrin Julian Nocturnist [facility] Pager [phone]   11/06/2011, 11:29 PM

## 2011-11-06 NOTE — ED Notes (Signed)
Pt assisted to bedside commode

## 2011-11-06 NOTE — ED Notes (Signed)
Pt swallowed water and cracker without difficulty, no choking or coughing observed.

## 2011-11-06 NOTE — ED Notes (Signed)
pt blood pressure dropped after nitro. Pt given a fluid bolus. EDP notified.

## 2011-11-06 NOTE — ED Notes (Signed)
Pt states her face stills feels funny, but improved since she arrived.

## 2011-11-06 NOTE — ED Provider Notes (Signed)
History     CSN: [id number]  Arrival date & time 11/06/11  1740   First MD Initiated Contact with Patient 11/06/11 1815      Chief Complaint  Patient presents with  . Bradycardia  . Chest Pain    (Consider location/radiation/quality/duration/timing/severity/associated sxs/prior treatment) HPI Patient complaining of vertigo with head spinning today at 230 when she got up.  At 4 p.m. She had chest pain.  Patient describes head continued to spinning at 4 p.m.  And she states her words were slurring.  Chest is burning began at 1430 and constant.  Walking difficulty when dizzy.  History of sinus infections.  No headache.  PMD Dr. Everardo Pacific, cardiology - [place].  Defibrillator did not fire.  Patient says chest pain is currently at a 6/10 in the anterior chest. She has had evaluations of echocardiogram but has not had any previous cardiac catheterization by her report and through chart review  Past Medical History  Diagnosis Date  . Hypertension     09/2010-normal CMet and CBC; Lipid profile-116, 88, 25, 73  . Cardiomyopathy 08/2010    Presented with congestive heart failure; EF of 15% and 2012; hypotension on medication precludes optimal dosing  . Gastroesophageal reflux disease   . Hypothyroidism     Recent TSH was normal.  . Pneumonia   . Obesity   . Hyperlipidemia   . Tobacco abuse     20 pack years  . CHF (congestive heart failure)   . Asthma   . Shortness of breath   . Seizures   . Headache   . Arthritis   . Anxiety   . Neuropathy   . 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 Cesarean section     X2    Family History  Problem Relation Age of Onset  . Cardiomyopathy Mother     ICD pacemaker-ischmic CM  . Heart failure Mother   . Hypertension Mother   . Cardiomyopathy Father     Deceased  . Coronary artery disease Father   . Heart failure Father   . Heart failure Brother   . Hypertension Brother      History  Substance Use Topics  . Smoking status: Current Everyday Smoker -- 0.5 packs/day for 21 years    Types: Cigarettes  . Smokeless tobacco: Never Used  . Alcohol Use: No    OB History    Grav Para Term Preterm Abortions TAB SAB Ect Mult Living                  Review of Systems  All other systems reviewed and are negative.    Allergies  Penicillins; Iodinated diagnostic agents; Iodine; Sulfa antibiotics; and Spironolactone  Home Medications   Current Outpatient Rx  Name Route Sig Dispense Refill  . ALBUTEROL SULFATE HFA 108 (90 BASE) MCG/ACT IN AERS Inhalation Inhale 2 puffs into the lungs every 6 (six) hours as needed. For shortness of breath    . BUDESONIDE-FORMOTEROL FUMARATE 80-4.5 MCG/ACT IN AERO Inhalation Inhale 2 puffs into the lungs 2 (two) times daily.    Marland Kitchen CARVEDILOL 25 MG PO TABS Oral Take 50 mg by mouth 2 (two) times daily. Take 2 tablets twice daily    . DESLORATADINE 5 MG PO TABS Oral Take 5 mg by mouth daily.    Marland Kitchen FERROUS SULFATE 325 (65 FE) MG PO TABS Oral Take 325 mg by mouth daily with breakfast.    .  OMEGA-3 FATTY ACIDS 1000 MG PO CAPS Oral Take 3 g by mouth 3 (three) times daily.      . FUROSEMIDE 40 MG PO TABS Oral Take 80 mg by mouth 2 (two) times daily.    Marland Kitchen GABAPENTIN 300 MG PO CAPS Oral Take 300 mg by mouth at bedtime.     Marland Kitchen LEVOTHYROXINE SODIUM 300 MCG PO TABS Oral Take 300 mcg by mouth daily.      Marland Kitchen ONE-DAILY MULTI VITAMINS PO TABS Oral Take 1 tablet by mouth daily.    Marland Kitchen POTASSIUM CHLORIDE CRYS ER 20 MEQ PO TBCR Oral Take 1 tablet (20 mEq total) by mouth 2 (two) times daily. 60 tablet 0  . QUINAPRIL HCL 40 MG PO TABS Oral Take 1 tablet (40 mg total) by mouth at bedtime. 30 tablet 12  . RANITIDINE HCL 150 MG PO TABS Oral Take 150 mg by mouth 2 (two) times daily as needed. For gastric reflux      BP 126/67  Pulse 73  Temp 98.1 F (36.7 C) (Oral)  Resp 20  Ht 5' 7.5" (1.715 m)  Wt 268 lb (121.564 kg)  BMI 41.36 kg/m2  SpO2  98%  Physical Exam  Nursing note and vitals reviewed. Constitutional: She appears well-developed and well-nourished.       obese  HENT:  Head: Normocephalic and atraumatic.  Eyes: Conjunctivae and EOM are normal. Pupils are equal, round, and reactive to light.  Neck: Normal range of motion. Neck supple.  Cardiovascular: Normal rate, regular rhythm, normal heart sounds and intact distal pulses.   Pulmonary/Chest: Effort normal and breath sounds normal.  Abdominal: Soft. Bowel sounds are normal.  Musculoskeletal: Normal range of motion.  Neurological: She is alert.  Skin: Skin is warm and dry.  Psychiatric: She has a normal mood and affect. Thought content normal.    ED Course  Procedures (including critical care time)   Labs Reviewed  CBC WITH DIFFERENTIAL  BASIC METABOLIC PANEL  TROPONIN I   No results found.   No diagnosis found.  Date: 11/06/2011  Rate: 70  Rhythm: normal sinus rhythm  QRS Axis: normal  Intervals: normal  ST/T Wave abnormalities: nonspecific ST/T changes  Conduction Disutrbances:none  Narrative Interpretation:   Old EKG Reviewed: Patient with previous nsst changes but more prominent in v3 and v4 now     MDM  Patient given asa and nitro with resolution of chest pain after nitro x 2.    Patient's care discussed with Dr. Orvan Falconer and patient will be placed in observation bed on telemetry.      Hilario Quarry, MD 11/06/11 2121

## 2011-11-07 DIAGNOSIS — R42 Dizziness and giddiness: Secondary | ICD-10-CM

## 2011-11-07 DIAGNOSIS — Z95810 Presence of automatic (implantable) cardiac defibrillator: Secondary | ICD-10-CM

## 2011-11-07 DIAGNOSIS — G459 Transient cerebral ischemic attack, unspecified: Secondary | ICD-10-CM | POA: Diagnosis present

## 2011-11-07 DIAGNOSIS — I517 Cardiomegaly: Secondary | ICD-10-CM

## 2011-11-07 DIAGNOSIS — I5022 Chronic systolic (congestive) heart failure: Secondary | ICD-10-CM

## 2011-11-07 DIAGNOSIS — I10 Essential (primary) hypertension: Secondary | ICD-10-CM

## 2011-11-07 DIAGNOSIS — I428 Other cardiomyopathies: Secondary | ICD-10-CM

## 2011-11-07 LAB — LIPID PANEL
HDL: 32 mg/dL — ABNORMAL LOW (ref >39)
LDL Cholesterol: 120 mg/dL — ABNORMAL HIGH (ref 0–99)
Total CHOL/HDL Ratio: 6.2 RATIO
Triglycerides: 231 mg/dL — ABNORMAL HIGH (ref <150)
VLDL: 46 mg/dL — ABNORMAL HIGH (ref 0–40)

## 2011-11-07 LAB — CBC
Hemoglobin: 12.7 g/dL (ref 12.0–15.0)
MCHC: 33.2 g/dL (ref 30.0–36.0)
RBC: 4.13 MIL/uL (ref 3.87–5.11)
WBC: 8 10*3/uL (ref 4.0–10.5)

## 2011-11-07 LAB — CARDIAC PANEL(CRET KIN+CKTOT+MB+TROPI)
Relative Index: INVALID (ref 0.0–2.5)
Relative Index: INVALID (ref 0.0–2.5)
Troponin I: <0.3 ng/mL (ref <0.30)

## 2011-11-07 LAB — COMPREHENSIVE METABOLIC PANEL
ALT: 16 U/L (ref 0–35)
Albumin: 3.1 g/dL — ABNORMAL LOW (ref 3.5–5.2)
Alkaline Phosphatase: 87 U/L (ref 39–117)
Chloride: 102 mEq/L (ref 96–112)
GFR calc Af Amer: >90 mL/min (ref >90)
Glucose, Bld: 89 mg/dL (ref 70–99)
Potassium: 3.1 mEq/L — ABNORMAL LOW (ref 3.5–5.1)
Sodium: 139 mEq/L (ref 135–145)
Total Protein: 6 g/dL (ref 6.0–8.3)

## 2011-11-07 MED ORDER — OMEGA-3-ACID ETHYL ESTERS 1 G PO CAPS
3.0000 g | ORAL_CAPSULE | Freq: Three times a day (TID) | ORAL | Status: DC
  Administered 2011-11-07 (×2): 3 g via ORAL
  Filled 2011-11-07 (×2): qty 3

## 2011-11-07 MED ORDER — LEVOTHYROXINE SODIUM 100 MCG PO TABS
300.0000 ug | ORAL_TABLET | Freq: Every day | ORAL | Status: DC
  Administered 2011-11-07: 300 ug via ORAL
  Filled 2011-11-07: qty 3

## 2011-11-07 MED ORDER — CARVEDILOL 12.5 MG PO TABS
50.0000 mg | ORAL_TABLET | Freq: Two times a day (BID) | ORAL | Status: DC
  Administered 2011-11-07: 50 mg via ORAL
  Filled 2011-11-07: qty 4

## 2011-11-07 MED ORDER — BUDESONIDE-FORMOTEROL FUMARATE 80-4.5 MCG/ACT IN AERO
2.0000 | INHALATION_SPRAY | Freq: Two times a day (BID) | RESPIRATORY_TRACT | Status: DC
  Administered 2011-11-07: 2 via RESPIRATORY_TRACT
  Filled 2011-11-07: qty 6.9

## 2011-11-07 MED ORDER — TRAZODONE HCL 50 MG PO TABS: 25.0000 mg | ORAL_TABLET | Freq: Every evening | ORAL | Status: DC | PRN

## 2011-11-07 MED ORDER — POTASSIUM CHLORIDE CRYS ER 20 MEQ PO TBCR
20.0000 meq | EXTENDED_RELEASE_TABLET | Freq: Two times a day (BID) | ORAL | Status: DC
  Administered 2011-11-07: 20 meq via ORAL
  Filled 2011-11-07 (×2): qty 1

## 2011-11-07 MED ORDER — ACETAMINOPHEN 325 MG PO TABS: 650.0000 mg | ORAL_TABLET | ORAL | Status: DC | PRN

## 2011-11-07 MED ORDER — BISACODYL 5 MG PO TBEC: 5.0000 mg | DELAYED_RELEASE_TABLET | Freq: Every day | ORAL | Status: DC | PRN

## 2011-11-07 MED ORDER — FLEET ENEMA 7-19 GM/118ML RE ENEM: 1.0000 | ENEMA | Freq: Once | RECTAL | Status: DC | PRN

## 2011-11-07 MED ORDER — POTASSIUM CHLORIDE IN NACL 40-0.9 MEQ/L-% IV SOLN
INTRAVENOUS | Status: DC
  Administered 2011-11-07: 10:00:00 via INTRAVENOUS
  Filled 2011-11-07 (×3): qty 1000

## 2011-11-07 MED ORDER — ASPIRIN 325 MG PO TABS
325.0000 mg | ORAL_TABLET | Freq: Every day | ORAL | Status: DC
Start: 2011-11-07 — End: 2011-11-07
  Administered 2011-11-07: 325 mg via ORAL
  Filled 2011-11-07: qty 1

## 2011-11-07 MED ORDER — ALBUTEROL SULFATE HFA 108 (90 BASE) MCG/ACT IN AERS: 2.0000 | INHALATION_SPRAY | Freq: Four times a day (QID) | RESPIRATORY_TRACT | Status: DC | PRN

## 2011-11-07 MED ORDER — LISINOPRIL 10 MG PO TABS: 40.0000 mg | ORAL_TABLET | Freq: Every day | ORAL | Status: DC

## 2011-11-07 MED ORDER — OXYCODONE HCL 5 MG PO TABS: 5.0000 mg | ORAL_TABLET | ORAL | Status: DC | PRN

## 2011-11-07 NOTE — Progress Notes (Signed)
UR Chart Review Completed  

## 2011-11-07 NOTE — Discharge Summary (Signed)
Physician Discharge Summary  Melanie Cordova UJW:[id number] DOB: 01/01/1961 DOA: 11/06/2011  PCP: Melanie Kelch, NP  Admit date: 11/06/2011 Discharge date: 11/07/2011  Recommendations for Outpatient Follow-up:  1. Followup with primary cardiologist.   Discharge Diagnoses:  1. Dizziness/vertigo, unclear etiology, improved with intravenous fluids. No arrhythmias, no evidence of ischemia. 2. Nonischemic cardiomyopathy, ejection fraction 20%, status post ICD insertion one month ago. 3. Obesity.   Discharge Condition: Stable and improved.  Diet recommendation: Heart healthy. Focus on low glycemic index nutrition.  Filed Weights   11/06/11 1745  Weight: 121.564 kg (268 lb)    History of present illness:  This 51 year old lady presents to the hospital with episodes of dizziness/vertigo whenever she stood up. There was no associated palpitations or chest pain. She has a history of nonischemic cardiomyopathy with ejection fraction of only 20% and had ICD placed one month ago.  Hospital Course:  She was admitted to telemetry floor and there were no arrhythmias seen. Serial cardiac enzymes were negative. She was given intravenous fluids as she was felt to be hypovolemic. With this she actually did feel better and unfortunately orthostatics were not ordered on admission. Today after hydration, she is not orthostatic. She is keen to go home. She has been seen by cardiology, who do not feel that she needs any change of medications.  Procedures:  None.  Consultations:  Cardiology, Dr. Dietrich Pates.  Discharge Exam: Filed Vitals:   11/07/11 1300  BP: 136/89  Pulse: 69  Temp: 97.5 F (36.4 C)  Resp: 20   Filed Vitals:   11/07/11 0748 11/07/11 0952 11/07/11 1141 11/07/11 1300  BP: 122/80 117/74  136/89  Pulse: 63 59  69  Temp: 97.7 F (36.5 C) 98.4 F (36.9 C)  97.5 F (36.4 C)  TempSrc: Oral Oral  Oral  Resp: 20 20  20   Height:      Weight:      SpO2: 96% 93% 94% 96%     General: Looks systemically well. Cardiovascular: Heart sounds are present and normal without murmurs. She's not in heart failure clinically. Respiratory: Lung fields are clear. She is alert and orientated.  Discharge Instructions  Discharge Orders    Future Appointments: Provider: Department: Dept Phone: Center:   01/16/2012 8:30 AM Melanie Maw, MD [facility] [phone] [id number]     Future Orders Please Complete By Expires   Diet - low sodium heart healthy      Increase activity slowly        Medication List  As of 11/07/2011  3:25 PM   TAKE these medications         budesonide-formoterol 80-4.5 MCG/ACT inhaler   Commonly known as: SYMBICORT   Inhale 2 puffs into the lungs 2 (two) times daily.      carvedilol 25 MG tablet   Commonly known as: COREG   Take 50 mg by mouth 2 (two) times daily. Take 2 tablets twice daily      desloratadine 5 MG tablet   Commonly known as: CLARINEX   Take 5 mg by mouth daily.      fish oil-omega-3 fatty acids 1000 MG capsule   Take 3 g by mouth 3 (three) times daily.      furosemide 40 MG tablet   Commonly known as: LASIX   Take 80 mg by mouth 2 (two) times daily.      gabapentin 300 MG capsule   Commonly known as: NEURONTIN   Take 300 mg by mouth at bedtime.  IRON SUPPLEMENT 325 (65 FE) MG tablet   Generic drug: ferrous sulfate   Take 325 mg by mouth daily with breakfast.      levothyroxine 300 MCG tablet   Commonly known as: SYNTHROID, LEVOTHROID   Take 300 mcg by mouth daily.      multivitamin tablet   Take 1 tablet by mouth daily.      potassium chloride SA 20 MEQ tablet   Commonly known as: K-DUR,KLOR-CON   Take 1 tablet (20 mEq total) by mouth 2 (two) times daily.      PROVENTIL HFA 108 (90 BASE) MCG/ACT inhaler   Generic drug: albuterol   Inhale 2 puffs into the lungs every 6 (six) hours as needed. For shortness of breath      quinapril 40 MG tablet   Commonly known as: ACCUPRIL   Take 1  tablet (40 mg total) by mouth at bedtime.      ranitidine 150 MG tablet   Commonly known as: ZANTAC   Take 150 mg by mouth 2 (two) times daily as needed. For gastric reflux              The results of significant diagnostics from this hospitalization (including imaging, microbiology, ancillary and laboratory) are listed below for reference.    Significant Diagnostic Studies: Dg Chest 2 View  10/12/2011  *RADIOLOGY REPORT*  Clinical Data: AICD activation.  Chest pain and burning sensation.  CHEST - 2 VIEW  Comparison: Two-view chest 10/11/2011.  Findings: A single lead AICD is stable position.  The heart is mildly enlarged.  The lungs are clear.  Emphysematous changes are present.  Mild degenerative changes in the thoracic spine are stable.  IMPRESSION:  1.  No acute cardiopulmonary disease or significant interval change. 2.  Stable cardiomegaly without failure. 3.  Emphysema.  Original Report Authenticated By: Jamesetta Orleans. MATTERN, M.D.   Dg Chest 2 View  10/11/2011  *RADIOLOGY REPORT*  Clinical Data: Placement of defibrillator, some shortness of breath and chest soreness  CHEST - 2 VIEW  Comparison: Portable chest x-ray of 10/01/2011  Findings: The lungs are clear.  Mild cardiomegaly is stable.  A permanent pacemaker with AICD lead is now present.  No bony abnormality is seen.  IMPRESSION: Stable cardiomegaly.  Permanent pacer with AICD lead.  Original Report Authenticated By: Juline Patch, M.D.   Ct Head Wo Contrast  11/06/2011  *RADIOLOGY REPORT*  Clinical Data: Altered level of consciousness.  CT HEAD WITHOUT CONTRAST  Technique:  Contiguous axial images were obtained from the base of the skull through the vertex without contrast.  Comparison: Brain MRI and head CT 11/17/2010.  Findings: The brain appears normal without evidence of infarct, hemorrhage, mass, mass effect, midline shift or abnormal extra- axial fluid collection.  No hydrocephalus or pneumocephalus.  IMPRESSION: Negative  exam.   Original Report Authenticated By: Bernadene Bell. D'ALESSIO, M.D.    Dg Chest Portable 1 View  11/06/2011  *RADIOLOGY REPORT*  Clinical Data: Chest pain, bradycardia  PORTABLE CHEST - 1 VIEW  Comparison: 10/12/2011  Findings: Lungs are essentially clear. No pleural effusion or pneumothorax.  Cardiomediastinal silhouette is within normal limits.  Left subclavian ICD.  IMPRESSION: No evidence of acute cardiopulmonary disease.   Original Report Authenticated By: Charline Bills, M.D.        Labs: Basic Metabolic Panel:  Lab 11/07/11 1610 11/06/11 1801  NA 139 137  K 3.1* 3.0*  CL 102 99  CO2 30 31  GLUCOSE 89 94  BUN 9 12  CREATININE 0.78 0.85  CALCIUM 8.8 8.9  MG -- --  PHOS -- --   Liver Function Tests:  Lab 11/07/11 0328 11/06/11 1801  AST 15 15  ALT 16 19  ALKPHOS 87 99  BILITOT 0.4 0.3  PROT 6.0 6.4  ALBUMIN 3.1* 3.4*     CBC:  Lab 11/07/11 0328 11/06/11 1801  WBC 8.0 11.2*  NEUTROABS -- 7.2  HGB 12.7 13.4  HCT 38.2 39.8  MCV 92.5 92.1  PLT 211 220   Cardiac Enzymes:  Lab 11/07/11 1057 11/07/11 0327 11/06/11 1801  CKTOTAL 32 35 49  CKMB 1.2 1.3 1.5  CKMBINDEX -- -- --  TROPONINI <0.30 <0.30 <0.30   BNP: BNP (last 3 results)  Basename 04/21/11 0403 04/20/11 1937 11/17/10 0031  PROBNP 1464.0* 1563.0* 963.8*   CBG:  Lab 11/06/11 1903  GLUCAP 94    Time coordinating discharge: Greater than 30 minutes  Signed:  Wilson Singer  [facility] 11/07/2011, 3:25 PM

## 2011-11-07 NOTE — Progress Notes (Signed)
IV removed, site WNL.  Pt given d/c instructions.  Discussed home care with patient and discussed home medications, patient verbalizes understanding, teachback completed. F/U appointment in place with rothbart Pt states they will keep appointment. Pt is stable at this time desire to go home. Pt taken to main entrance in wheelchair by staff member.

## 2011-11-07 NOTE — Progress Notes (Signed)
*  PRELIMINARY RESULTS* Echocardiogram 2D Echocardiogram has been performed.  Conrad Sterling 11/07/2011, 1:48 PM

## 2011-11-07 NOTE — Progress Notes (Signed)
Spoke with Joni Reining NP.  Ok for Ms Fulop to return to work light duty 11-09-11. Added to d/c instructions.

## 2011-11-07 NOTE — Evaluation (Signed)
Physical Therapy Evaluation Patient Details Name: Melanie Cordova MRN: [id number] DOB: 11-06-1960 Today's Date: 11/07/2011 Time: 4098-1191 PT Time Calculation (min): 15 min  PT Assessment / Plan / Recommendation Clinical Impression  Pt is referred to PT to assess current mobility.  Pt able to complete independent ambulation, ascend/descend 12 stairs w/1 HR.  At this point Melanie Cordova greatest complaint is difficulty lifting necessary equipement for work.  She is unsure if she be completing heavy lifiting secondary to recent defibulator implant.  However at this point pt is at Melanie Cordova PLOF and will no longer require acute PT services.     PT Assessment  Patent does not need any further PT services    Follow Up Recommendations  Outpatient PT    Barriers to Discharge        Equipment Recommendations  None recommended by PT    Recommendations for Other Services     Frequency      Precautions / Restrictions     Pertinent Vitals/Pain No/denies      Mobility  Bed Mobility Bed Mobility: Supine to Sit;Sit to Supine Supine to Sit: 7: Independent Sit to Supine: 7: Independent Transfers Transfers: Sit to Stand;Stand to Sit Sit to Stand: 7: Independent Stand to Sit: 7: Independent Ambulation/Gait Ambulation/Gait Assistance: 7: Independent Ambulation Distance (Feet): 500 Feet Assistive device: None Gait Pattern: Within Functional Limits Gait velocity: appropriate speed Stairs: Yes Stairs Assistance: 6: Modified independent (Device/Increase time) Stair Management Technique: One rail Right Number of Stairs: 12     Exercises     PT Diagnosis:    PT Problem List:   PT Treatment Interventions:     PT Goals    Visit Information  Last PT Received On: 11/07/11    Subjective Data      Prior Functioning  Home Living Lives With: Spouse Type of Home: House Home Access: Level entry Home Layout: Two level Alternate Level Stairs-Number of Steps: 12 Alternate Level Stairs-Rails:  Right Home Adaptive Equipment: None Prior Function Level of Independence: Independent Able to Take Stairs?: Reciprically Driving: Yes Vocation: Full time employment 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

## 2011-11-07 NOTE — Progress Notes (Addendum)
Subjective: This lady was admitted yesterday with symptoms of dizziness/lightheadedness/vertigo upon standing from a sitting position. This occurred approximately 2-3 times yesterday. There was never any loss of consciousness. The episodes usually lasted 5-10 minutes only. There was no alteration in mental status. She did not really have any chest pain. Approximately one month ago she had insertion of ICD.           Physical Exam: Blood pressure 134/77, pulse 55, temperature 97.6 F (36.4 C), temperature source Axillary, resp. rate 20, height 5' 7.5" (1.715 m), weight 121.564 kg (268 lb), SpO2 93.00%. She looks systemically well. Heart sounds are present and normal without murmurs. Lung fields are clear. There is no evidence of heart failure currently. She is alert and orientated. There are no focal neurological signs.   Investigations:     Basic Metabolic Panel:  Basename 11/07/11 0328 11/06/11 1801  NA 139 137  K 3.1* 3.0*  CL 102 99  CO2 30 31  GLUCOSE 89 94  BUN 9 12  CREATININE 0.78 0.85  CALCIUM 8.8 8.9  MG -- --  PHOS -- --   Liver Function Tests:  [facility] 11/07/11 0328 11/06/11 1801  AST 15 15  ALT 16 19  ALKPHOS 87 99  BILITOT 0.4 0.3  PROT 6.0 6.4  ALBUMIN 3.1* 3.4*     CBC:  Basename 11/07/11 0328 11/06/11 1801  WBC 8.0 11.2*  NEUTROABS -- 7.2  HGB 12.7 13.4  HCT 38.2 39.8  MCV 92.5 92.1  PLT 211 220    Ct Head Wo Contrast  11/06/2011  *RADIOLOGY REPORT*  Clinical Data: Altered level of consciousness.  CT HEAD WITHOUT CONTRAST  Technique:  Contiguous axial images were obtained from the base of the skull through the vertex without contrast.  Comparison: Brain MRI and head CT 11/17/2010.  Findings: The brain appears normal without evidence of infarct, hemorrhage, mass, mass effect, midline shift or abnormal extra- axial fluid collection.  No hydrocephalus or pneumocephalus.  IMPRESSION: Negative exam.   Original Report Authenticated By:  Bernadene Bell. D'ALESSIO, M.D.    Dg Chest Portable 1 View  11/06/2011  *RADIOLOGY REPORT*  Clinical Data: Chest pain, bradycardia  PORTABLE CHEST - 1 VIEW  Comparison: 10/12/2011  Findings: Lungs are essentially clear. No pleural effusion or pneumothorax.  Cardiomediastinal silhouette is within normal limits.  Left subclavian ICD.  IMPRESSION: No evidence of acute cardiopulmonary disease.   Original Report Authenticated By: Charline Bills, M.D.       Medications:  Scheduled:   . aspirin  324 mg Oral Once  . aspirin  325 mg Oral Daily  . budesonide-formoterol  2 puff Inhalation BID  . carvedilol  50 mg Oral BID WC  . enoxaparin  40 mg Subcutaneous Q24H  . famotidine  20 mg Oral BID  . gabapentin  300 mg Oral QHS  . levothyroxine  300 mcg Oral Daily  . lisinopril  40 mg Oral QHS  . omega-3 acid ethyl esters  3 g Oral TID WC  . potassium chloride SA  20 mEq Oral BID  . potassium chloride SA  40 mEq Oral Once    Impression: 1. Postural hypotension by symptoms. 2. Recent insertion of ICD. Nonischemic cardiomyopathy with ejection fraction 20%. History of chronic systolic heart failure, currently compensated. 3. Obesity. 4. Hypokalemia. 5. There is no clinical evidence of TIA/CVA. Marland Kitchen     Plan: 1. Continue with gentle intravenous fluid hydration. 2. Replete potassium. 3. Cardiology consultation. I wonder  whether she needs adjustment of her medications. She tells me that she has been compliant with all her medications and she has not had any decrease in her oral fluid intake recently.     LOS: 1 day   Wilson Singer Pager [phone]  11/07/2011, 7:45 AM

## 2011-11-07 NOTE — Consult Note (Signed)
CARDIOLOGY CONSULT NOTE  Patient ID: Melanie Cordova MRN: [id number] DOB/AGE: 1960-03-30 52 y.o.  Admit date: 11/06/2011 Referring Physician: PTH Dr. Karilyn Cota Primary PhysicianALLEN, DEBRA, NP Primary Cardiologist:Yuliza Cara Reason for Consultation: Near Syncope in the setting of NICM with AICD in situ  Principal Problem:  *Chest pain Active Problems:  Obesity  Hypertension  Cardiomyopathy  Gastroesophageal reflux disease  Hypothyroidism  Tobacco abuse  Hyperlipidemia  Chronic systolic heart failure  Hypokalemia  ICD -St.Jude  Dizziness  TIA (transient ischemic attack)  HPI: Melanie Cordova is a 51 year old obese patient of Dr. Dietrich Pates and Dr. Sharrell Ku with known history of nonischemic cardiomyopathy with class II heart failure, status post ICD implantation , St. Jude single-chamber defibrillator, serial number [id number] with [employer] right ventricular lead on 10/11/2011, hypertension, GERD, hyperlipidemia. Cardiac catheterization was not completed prior to this procedure.    She states that on day of admission , when she got up  from lying in the bed, the room began spinning. It was very severe so she laid back down for couple hours. When she awoke again and sat up again the room was spinning. She denied any presyncope, or ICD discharge. As a result of frequent vertigo she was seen in the emergency room.   She was admitted secondary to recurrent symptoms.  On arrival the patient's blood pressure is 126/67 with a pulse of 73 she was afebrile. She was not found to be anemic, she had mildly elevated white blood cells 11.2, troponin was negative, she was hypokalemic with a potassium of 3.0. She has a history of hypokalemia, and had been on spironolactone at one point, but this was discontinued secondary to allergic reaction causing rash. Her potassium was increased to 40 mEq twice a day. On discharge from ICD implantation, potassium was 4.3. Creatinine was 0.85 on  admission.  Orthostatic blood pressures have been completed by nurses this a.m. and she was not found to be orthostatic. Blood pressure 134/77 lying; with a blood pressure of 126/86 sitting; and 124/85 standing. Heart rate was stable. She was given IV hydration, and potassium replacement. Has had no further symptoms. She is anxious to return home.  Review of systems complete and found to be negative unless listed above   Past Medical History  Diagnosis Date  . Hypertension     09/2010-normal CMet and CBC; Lipid profile-116, 88, 25, 73  . Cardiomyopathy 08/2010    Presented with congestive heart failure; EF of 15% and 2012; hypotension on medication precludes optimal dosing  . Gastroesophageal reflux disease   . Hypothyroidism     Recent TSH was normal.  . Pneumonia   . Obesity   . Hyperlipidemia   . Tobacco abuse     20 pack years  . CHF (congestive heart failure)   . Asthma   . Shortness of breath   . Seizures   . Headache   . Arthritis   . Anxiety   . Neuropathy   . ICD (implantable cardiac defibrillator) in place 10/10/2011    Family History  Problem Relation Age of Onset  . Cardiomyopathy Mother     ICD pacemaker-ischmic CM  . Heart failure Mother   . Hypertension Mother   . Cardiomyopathy Father     Deceased  . Coronary artery disease Father   . Heart failure Father   . Heart failure Brother   . Hypertension Brother     History   Social History  . Marital Status: Divorced  Spouse Name: N/A    Number of Children: 2  . Years of Education: N/A   Occupational History  .      Works in Science writer   Social History Main Topics  . Smoking status: Current Everyday Smoker -- 0.5 packs/day for 21 years    Types: Cigarettes  . Smokeless tobacco: Never Used  . Alcohol Use: No  . Drug Use: No  . Sexually Active: No   Other Topics Concern  . Not on file   Social History Narrative  . No narrative on file    Past Surgical History  Procedure Date  .  Cesarean section     X2    Prescriptions prior to admission  Medication Sig Dispense Refill  . albuterol (PROVENTIL HFA) 108 (90 BASE) MCG/ACT inhaler Inhale 2 puffs into the lungs every 6 (six) hours as needed. For shortness of breath      . budesonide-formoterol (SYMBICORT) 80-4.5 MCG/ACT inhaler Inhale 2 puffs into the lungs 2 (two) times daily.      . carvedilol (COREG) 25 MG tablet Take 50 mg by mouth 2 (two) times daily. Take 2 tablets twice daily      . desloratadine (CLARINEX) 5 MG tablet Take 5 mg by mouth daily.      . ferrous sulfate (IRON SUPPLEMENT) 325 (65 FE) MG tablet Take 325 mg by mouth daily with breakfast.      . fish oil-omega-3 fatty acids 1000 MG capsule Take 3 g by mouth 3 (three) times daily.        . furosemide (LASIX) 40 MG tablet Take 80 mg by mouth 2 (two) times daily.      Marland Kitchen gabapentin (NEURONTIN) 300 MG capsule Take 300 mg by mouth at bedtime.       Marland Kitchen levothyroxine (SYNTHROID, LEVOTHROID) 300 MCG tablet Take 300 mcg by mouth daily.        . Multiple Vitamin (MULTIVITAMIN) tablet Take 1 tablet by mouth daily.      . potassium chloride SA (K-DUR,KLOR-CON) 20 MEQ tablet Take 1 tablet (20 mEq total) by mouth 2 (two) times daily.  60 tablet  0  . quinapril (ACCUPRIL) 40 MG tablet Take 1 tablet (40 mg total) by mouth at bedtime.  30 tablet  12  . ranitidine (ZANTAC) 150 MG tablet Take 150 mg by mouth 2 (two) times daily as needed. For gastric reflux       Physical Exam: Blood pressure 117/74, pulse 59, temperature 98.4 F (36.9 C), temperature source Oral, resp. rate 20, height 5' 7.5" (1.715 m), weight 268 lb (121.564 kg), SpO2 93.00%.  General: Well developed, well nourished, obese, in no acute distress Head: Eyes PERRLA, No xanthomas.   Normal cephalic and atramatic  Lungs: Clear bilaterally to auscultation and percussion. Heart: HRRR S1 S2, without MRG.  Pulses are 2+ & equal.            No carotid bruit. No JVD.  No abdominal bruits. No femoral  bruits. Abdomen: Bowel sounds are positive, abdomen soft and non-tender without masses. Msk:  Back normal, normal gait. Normal strength and tone for age. Extremities: No clubbing, cyanosis or edema.  DP +1 Neuro: Alert and oriented X 3. Psych:  Good affect, responds appropriately   Lab Results  Component Value Date   WBC 8.0 11/07/2011   HGB 12.7 11/07/2011   HCT 38.2 11/07/2011   MCV 92.5 11/07/2011   PLT 211 11/07/2011    Lab 11/07/11 0328  NA 139  K 3.1*  CL 102  CO2 30  BUN 9  CREATININE 0.78  CALCIUM 8.8  PROT 6.0  BILITOT 0.4  ALKPHOS 87  ALT 16  AST 15  GLUCOSE 89   Lab Results  Component Value Date   CKTOTAL 35 11/07/2011   CKMB 1.3 11/07/2011   TROPONINI <0.30 11/07/2011     Radiology: Ct Head Wo Contrast 11/06/11: Negative exam Dg Chest Portable 1 View: Left subclavian ICD; otherwise normal.  WUJ:WJXBJY sinus rhythm, ST & T wave abnormality, with T-wave inversion, consider anterolateral ischemia  ASSESSMENT AND PLAN:   1. Vertigo: Symptoms do not appear to be cardiac in etiology. She denies palpitations, racing heart, or ICD discharge. Once she was given hydration, and potassium replacement, she began to feel better. She will followup in our office for ongoing assessment of treatment of cardiomyopathy.  2. NICM: EF of 20% per echocardiogram completed in February 2013. She will need to have a followup echocardiogram when seen on next visit. Of note she has not had a cardiac catheterization. This may need to be addressed on followup visit for further evaluation of ischemia as etiology for systolic dysfunction.  3. ICD in situ; Pacemaker interrogation is unnecessary at this time. She is scheduled for pacemaker interrogation followup within the next month. She will make him an appointment sooner should she become symptomatic. She needs to continue taking potassium as directed.  Bettey Mare. Lyman Bishop NP [facility] 11/07/2011, 10:19 AM  Cardiology  Attending Patient interviewed and examined. Discussed with Joni Reining, NP.  Above note annotated and modified based upon my findings.  History is consistent with vertigo or orthostatic hypotension.  Cardiac risk appears low, and symptoms have resolved.  Agree with plans for discharge today with continuing care in our office.  [place] Bing, MD 11/07/2011, 6:13 PM

## 2011-11-29 ENCOUNTER — Other Ambulatory Visit (HOSPITAL_COMMUNITY)
Admission: RE | Admit: 2011-11-29 | Discharge: 2011-11-29 | Disposition: A | Discharge status: Home / Self Care | Payer: Medicaid Other | Source: Ambulatory Visit | Attending: Unknown Physician Specialty | Admitting: Unknown Physician Specialty

## 2011-11-29 DIAGNOSIS — R87612 Low grade squamous intraepithelial lesion on cytologic smear of cervix (LGSIL): Secondary | ICD-10-CM | POA: Insufficient documentation

## 2011-11-30 ENCOUNTER — Other Ambulatory Visit: Payer: Self-pay | Admitting: *Deleted

## 2011-11-30 ENCOUNTER — Encounter: Payer: Self-pay | Admitting: *Deleted

## 2011-11-30 ENCOUNTER — Encounter: Payer: Self-pay | Admitting: Cardiology

## 2011-11-30 MED ORDER — POTASSIUM CHLORIDE CRYS ER 20 MEQ PO TBCR: 40.0000 meq | EXTENDED_RELEASE_TABLET | Freq: Two times a day (BID) | ORAL | Status: DC

## 2011-11-30 NOTE — Progress Notes (Signed)
Attempted to contact patient regarding new medication recommendations.  Message left for a return call.

## 2011-11-30 NOTE — Progress Notes (Signed)
Patient made aware of information below and verbalized understanding.

## 2011-12-06 ENCOUNTER — Telehealth: Payer: Self-pay | Admitting: Cardiology

## 2011-12-13 NOTE — Telephone Encounter (Signed)
Erroneous encounter

## 2011-12-30 ENCOUNTER — Other Ambulatory Visit: Payer: Self-pay | Admitting: Cardiology

## 2011-12-30 ENCOUNTER — Other Ambulatory Visit: Payer: Self-pay | Admitting: *Deleted

## 2011-12-30 DIAGNOSIS — E876 Hypokalemia: Secondary | ICD-10-CM

## 2011-12-30 MED ORDER — CARVEDILOL 25 MG PO TABS: 50.0000 mg | ORAL_TABLET | Freq: Two times a day (BID) | ORAL | Status: DC

## 2011-12-30 MED ORDER — FUROSEMIDE 40 MG PO TABS: 80.0000 mg | ORAL_TABLET | Freq: Two times a day (BID) | ORAL | Status: DC

## 2011-12-31 LAB — LIPID PANEL
Cholesterol: 296 mg/dL — ABNORMAL HIGH (ref 0–200)
HDL: 39 mg/dL — ABNORMAL LOW (ref >39)
Total CHOL/HDL Ratio: 7.6 Ratio
Triglycerides: 465 mg/dL — ABNORMAL HIGH (ref <150)

## 2011-12-31 LAB — BASIC METABOLIC PANEL
Glucose, Bld: 82 mg/dL (ref 70–99)
Potassium: 4.4 mEq/L (ref 3.5–5.3)
Sodium: 140 mEq/L (ref 135–145)

## 2012-01-03 ENCOUNTER — Encounter: Payer: Self-pay | Admitting: Cardiology

## 2012-01-10 ENCOUNTER — Encounter: Payer: Self-pay | Admitting: *Deleted

## 2012-01-10 ENCOUNTER — Telehealth: Payer: Self-pay | Admitting: Cardiology

## 2012-01-10 NOTE — Telephone Encounter (Signed)
Patient states that one of her meds were deleted per [facility].  Wants to know if you know why.  States that it was her Synthroid. / tg

## 2012-01-10 NOTE — Telephone Encounter (Signed)
Advised her to contact her PCP regarding thyroid medication.  In addition, made her aware of upcomming appointment with Dr Dietrich Pates 11/13 @ 1:45.  Verbalized understanding.

## 2012-01-16 ENCOUNTER — Ambulatory Visit (INDEPENDENT_AMBULATORY_CARE_PROVIDER_SITE_OTHER): Payer: Self-pay | Admitting: Internal Medicine

## 2012-01-16 ENCOUNTER — Emergency Department (HOSPITAL_COMMUNITY): Payer: Medicaid Other

## 2012-01-16 ENCOUNTER — Encounter: Payer: Self-pay | Admitting: Internal Medicine

## 2012-01-16 ENCOUNTER — Emergency Department (HOSPITAL_COMMUNITY)
Admission: EM | Admit: 2012-01-16 | Discharge: 2012-01-16 | Disposition: A | Discharge status: Home / Self Care | Payer: Medicaid Other | Attending: Emergency Medicine | Admitting: Emergency Medicine

## 2012-01-16 ENCOUNTER — Encounter (HOSPITAL_COMMUNITY): Payer: Self-pay

## 2012-01-16 VITALS — BP 105/80 | HR 62 | Ht 67.5 in | Wt 290.0 lb

## 2012-01-16 DIAGNOSIS — G40909 Epilepsy, unspecified, not intractable, without status epilepticus: Secondary | ICD-10-CM | POA: Insufficient documentation

## 2012-01-16 DIAGNOSIS — M545 Low back pain, unspecified: Secondary | ICD-10-CM | POA: Insufficient documentation

## 2012-01-16 DIAGNOSIS — I10 Essential (primary) hypertension: Secondary | ICD-10-CM

## 2012-01-16 DIAGNOSIS — I509 Heart failure, unspecified: Secondary | ICD-10-CM | POA: Insufficient documentation

## 2012-01-16 DIAGNOSIS — K219 Gastro-esophageal reflux disease without esophagitis: Secondary | ICD-10-CM | POA: Insufficient documentation

## 2012-01-16 DIAGNOSIS — J45909 Unspecified asthma, uncomplicated: Secondary | ICD-10-CM | POA: Insufficient documentation

## 2012-01-16 DIAGNOSIS — I428 Other cardiomyopathies: Secondary | ICD-10-CM

## 2012-01-16 DIAGNOSIS — Z79899 Other long term (current) drug therapy: Secondary | ICD-10-CM | POA: Insufficient documentation

## 2012-01-16 DIAGNOSIS — M549 Dorsalgia, unspecified: Secondary | ICD-10-CM

## 2012-01-16 DIAGNOSIS — Z8739 Personal history of other diseases of the musculoskeletal system and connective tissue: Secondary | ICD-10-CM | POA: Insufficient documentation

## 2012-01-16 DIAGNOSIS — G541 Lumbosacral plexus disorders: Secondary | ICD-10-CM | POA: Insufficient documentation

## 2012-01-16 DIAGNOSIS — G589 Mononeuropathy, unspecified: Secondary | ICD-10-CM | POA: Insufficient documentation

## 2012-01-16 DIAGNOSIS — Z95810 Presence of automatic (implantable) cardiac defibrillator: Secondary | ICD-10-CM

## 2012-01-16 DIAGNOSIS — Z8701 Personal history of pneumonia (recurrent): Secondary | ICD-10-CM | POA: Insufficient documentation

## 2012-01-16 DIAGNOSIS — E669 Obesity, unspecified: Secondary | ICD-10-CM | POA: Insufficient documentation

## 2012-01-16 DIAGNOSIS — I5022 Chronic systolic (congestive) heart failure: Secondary | ICD-10-CM

## 2012-01-16 DIAGNOSIS — I429 Cardiomyopathy, unspecified: Secondary | ICD-10-CM

## 2012-01-16 DIAGNOSIS — E039 Hypothyroidism, unspecified: Secondary | ICD-10-CM | POA: Insufficient documentation

## 2012-01-16 DIAGNOSIS — E785 Hyperlipidemia, unspecified: Secondary | ICD-10-CM | POA: Insufficient documentation

## 2012-01-16 DIAGNOSIS — G629 Polyneuropathy, unspecified: Secondary | ICD-10-CM

## 2012-01-16 LAB — ICD DEVICE OBSERVATION
DEV-0020ICD: NEGATIVE
DEVICE MODEL ICD: 1022442
FVT: 0
RV LEAD AMPLITUDE: 12 mv
TOT-0007: 1
TOT-0008: 0
TOT-0010: 1
VENTRICULAR PACING ICD: 0.07 pct

## 2012-01-16 MED ORDER — LEVOTHYROXINE SODIUM 300 MCG PO TABS: 300.0000 ug | ORAL_TABLET | Freq: Every day | ORAL | Status: DC

## 2012-01-16 MED ORDER — GABAPENTIN 300 MG PO CAPS: 300.0000 mg | ORAL_CAPSULE | Freq: Every day | ORAL | Status: DC

## 2012-01-16 MED ORDER — FUROSEMIDE 80 MG PO TABS: 80.0000 mg | ORAL_TABLET | Freq: Two times a day (BID) | ORAL | Status: DC

## 2012-01-16 MED ORDER — POTASSIUM CHLORIDE CRYS ER 20 MEQ PO TBCR: 40.0000 meq | EXTENDED_RELEASE_TABLET | Freq: Two times a day (BID) | ORAL | Status: DC

## 2012-01-16 MED ORDER — OXYCODONE-ACETAMINOPHEN 5-325 MG PO TABS: 2.0000 | ORAL_TABLET | ORAL | Status: DC | PRN

## 2012-01-16 MED ORDER — BUDESONIDE-FORMOTEROL FUMARATE 80-4.5 MCG/ACT IN AERO: 2.0000 | INHALATION_SPRAY | Freq: Two times a day (BID) | RESPIRATORY_TRACT | Status: DC

## 2012-01-16 MED ORDER — OXYCODONE-ACETAMINOPHEN 5-325 MG PO TABS
2.0000 | ORAL_TABLET | Freq: Once | ORAL | Status: AC
  Administered 2012-01-16: 2 via ORAL
  Filled 2012-01-16: qty 2

## 2012-01-16 MED ORDER — ALBUTEROL SULFATE HFA 108 (90 BASE) MCG/ACT IN AERS: 2.0000 | INHALATION_SPRAY | Freq: Four times a day (QID) | RESPIRATORY_TRACT | Status: DC | PRN

## 2012-01-16 MED ORDER — QUINAPRIL HCL 40 MG PO TABS: 40.0000 mg | ORAL_TABLET | Freq: Every day | ORAL | Status: DC

## 2012-01-16 MED ORDER — CARVEDILOL 25 MG PO TABS: 50.0000 mg | ORAL_TABLET | Freq: Two times a day (BID) | ORAL | Status: DC

## 2012-01-16 NOTE — Assessment & Plan Note (Signed)
Her symptoms range from class II to class III. I have encouraged her to reduce her sodium intake, and continue her current medical therapy.

## 2012-01-16 NOTE — Patient Instructions (Signed)
Your physician recommends that you schedule a follow-up appointment in: 1 year with Dr Ladona Ridgel 3 months with Gunnar Fusi

## 2012-01-16 NOTE — ED Provider Notes (Signed)
History     CSN: [id number]  Arrival date & time 01/16/12  4098   First MD Initiated Contact with Patient 01/16/12 0401      Chief Complaint  Patient presents with  . Back Pain    (Consider location/radiation/quality/duration/timing/severity/associated sxs/prior treatment) HPI Melanie Cordova is a 51 y.o. female with a h/o HTN, cardiomyopathy with ICD , CHF, seizures,neuropathy,  who presents to the Emergency Department complaining of low back pain with radiation down both legs associated with numbness to the feet that has been present for months.She is followed by the [facility]. She has to sit to cook and cannot spend much time on her feet due to the pain in her lower back and then development of numbness and pain in her feet. She denies urinary or fecal incontinence, falling.  PCP [facility] Dr. Ladona Ridgel    Past Medical History  Diagnosis Date  . Hypertension     09/2010-normal CMet and CBC; Lipid profile-116, 88, 25, 73  . Cardiomyopathy 08/2010    Presented with congestive heart failure; EF of 15% and 2012; hypotension on medication precludes optimal dosing  . Gastroesophageal reflux disease   . Hypothyroidism     Recent TSH was normal.  . Pneumonia   . Obesity   . Hyperlipidemia   . Tobacco abuse     20 pack years  . CHF (congestive heart failure)   . Asthma   . Shortness of breath   . Seizures   . Headache   . Arthritis   . Anxiety   . Neuropathy   . 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 Cesarean section     X2    Family History  Problem Relation Age of Onset  . Cardiomyopathy Mother     ICD pacemaker-ischmic CM  . Heart failure Mother   . Hypertension Mother   . Cardiomyopathy Father     Deceased  . Coronary artery disease Father   . Heart failure Father   . Heart failure Brother   . Hypertension Brother     History  Substance Use Topics  . Smoking  status: Current Every Day Smoker -- 0.5 packs/day for 21 years    Types: Cigarettes  . Smokeless tobacco: Never Used  . Alcohol Use: No    OB History    Grav Para Term Preterm Abortions TAB SAB Ect Mult Living                  Review of Systems  Constitutional: Negative for fever.       10 Systems reviewed and are negative for acute change except as noted in the HPI.  HENT: Negative for congestion.   Eyes: Negative for discharge and redness.  Respiratory: Negative for cough and shortness of breath.   Cardiovascular: Negative for chest pain.  Gastrointestinal: Negative for vomiting and abdominal pain.  Musculoskeletal: Positive for back pain.       Leg pain, foot pain, numbness  Skin: Negative for rash.  Neurological: Negative for syncope, numbness and headaches.  Psychiatric/Behavioral:       No behavior change.    Allergies  Penicillins; Iodinated diagnostic agents; Iodine; Sulfa antibiotics; and Spironolactone  Home Medications   Current Outpatient Rx  Name  Route  Sig  Dispense  Refill  . ALBUTEROL SULFATE HFA 108 (90 BASE) MCG/ACT IN AERS   Inhalation   Inhale 2 puffs into the  lungs every 6 (six) hours as needed. For shortness of breath         . BUDESONIDE-FORMOTEROL FUMARATE 80-4.5 MCG/ACT IN AERO   Inhalation   Inhale 2 puffs into the lungs 2 (two) times daily.         Marland Kitchen CARVEDILOL 25 MG PO TABS   Oral   Take 2 tablets (50 mg total) by mouth 2 (two) times daily. Take 2 tablets twice daily   120 tablet   3   . DESLORATADINE 5 MG PO TABS   Oral   Take 5 mg by mouth daily.         Marland Kitchen FERROUS SULFATE 325 (65 FE) MG PO TABS   Oral   Take 325 mg by mouth daily with breakfast.         . OMEGA-3 FATTY ACIDS 1000 MG PO CAPS   Oral   Take 3 g by mouth 3 (three) times daily.           . FUROSEMIDE 40 MG PO TABS   Oral   Take 2 tablets (80 mg total) by mouth 2 (two) times daily.   120 tablet   3   . GABAPENTIN 300 MG PO CAPS   Oral   Take 300  mg by mouth at bedtime.          Marland Kitchen LEVOTHYROXINE SODIUM 300 MCG PO TABS   Oral   Take 300 mcg by mouth daily.           Marland Kitchen ONE-DAILY MULTI VITAMINS PO TABS   Oral   Take 1 tablet by mouth daily.         Marland Kitchen POTASSIUM CHLORIDE CRYS ER 20 MEQ PO TBCR   Oral   Take 2 tablets (40 mEq total) by mouth 2 (two) times daily.   180 tablet   6   . QUINAPRIL HCL 40 MG PO TABS   Oral   Take 1 tablet (40 mg total) by mouth at bedtime.   30 tablet   12   . RANITIDINE HCL 150 MG PO TABS   Oral   Take 150 mg by mouth 2 (two) times daily as needed. For gastric reflux           BP 114/78  Pulse 57  Temp 97.7 F (36.5 C) (Oral)  Resp 18  Ht 5' 7.5" (1.715 m)  Wt 265 lb (120.203 kg)  BMI 40.89 kg/m2  SpO2 97%  Physical Exam  Nursing note and vitals reviewed. Constitutional: She is oriented to person, place, and time. She appears well-developed and well-nourished.       Awake, alert, nontoxic appearance.  HENT:  Head: Atraumatic.  Eyes: Right eye exhibits no discharge. Left eye exhibits no discharge.  Neck: Neck supple.  Cardiovascular: Normal heart sounds.  Exam reveals no gallop.   No murmur heard. Pulmonary/Chest: Effort normal and breath sounds normal. She exhibits no tenderness.  Abdominal: Soft. Bowel sounds are normal. There is no tenderness. There is no rebound.  Musculoskeletal: She exhibits no tenderness.       Baseline ROM, no obvious new focal weakness.No spinal pain to percussion.  Neurological: She is alert and oriented to person, place, and time.       Mental status and motor strength appears baseline for patient and situation.Decreased sensation to toes and bottom of feet.  Skin: No rash noted.  Psychiatric: She has a normal mood and affect.    ED Course  Procedures (including critical care  time)  Dg Lumbar Spine Complete  01/16/2012  *RADIOLOGY REPORT*  Clinical Data: Severe right lower back pain and back pain.  LUMBAR SPINE - COMPLETE 4+ VIEW   Comparison: None.  Findings: There is no evidence of fracture or subluxation. Vertebral bodies demonstrate normal height and alignment. Intervertebral disc spaces are preserved.  The visualized neural foramina are grossly unremarkable in appearance.  The visualized bowel gas pattern is unremarkable in appearance; air and stool are noted within the colon.  The sacroiliac joints are within normal limits.  Prominent calcification is noted at the distal abdominal aorta.  IMPRESSION:  1.  No evidence of fracture or subluxation along the lumbar spine. 2.  Prominent calcification along the distal abdominal aorta.   Original Report Authenticated By: Tonia Ghent, M.D.      MDM  Pateint with lower back pain associated with pain to legs and feet and numbness to feet x 2 months. LS spine film without acute findings. Dx testing d/w pt.  Questions answered.  Verb understanding, agreeable to d/c home with outpt f/u.Pt stable in ED with no significant deterioration in condition.The patient appears reasonably screened and/or stabilized for discharge and I doubt any other medical condition or other [facility] requiring further screening, evaluation, or treatment in the ED at this time prior to discharge.  MDM Reviewed: nursing note and vitals Interpretation: x-ray            Nicoletta Dress. Colon Branch, MD 01/16/12 [phone]

## 2012-01-16 NOTE — ED Notes (Signed)
Back and leg pain that has been going on since the end of July per pt.

## 2012-01-16 NOTE — Assessment & Plan Note (Signed)
Her device is working normally. We'll plan to recheck in several months. 

## 2012-01-16 NOTE — Assessment & Plan Note (Signed)
Her blood pressure is well controlled today, despite being in significant pain in her lower back.

## 2012-01-16 NOTE — Addendum Note (Signed)
Addended by: Reather Laurence A on: 01/16/2012 10:44 AM   Modules accepted: Orders

## 2012-01-16 NOTE — Progress Notes (Signed)
HPI Melanie Cordova returns today for followup. She is a very pleasant 51 year old woman with an ischemic cardiomyopathy, chronic systolic heart failure, severe back pain, hypertension, and COPD. She has been under increased stress from financial difficulties as she has had difficulty working and has had trouble paying her light bill. She denies chest pain or shortness of breath. No ICD shock. No peripheral edema. Allergies  Allergen Reactions  . Penicillins Anaphylaxis and Shortness Of Breath    Hair loss  . Iodinated Diagnostic Agents Hives and Other (See Comments)    Pulmonary problems; no frank respiratory arrest  . Iodine   . Sulfa Antibiotics Other (See Comments)    Pt can't remember reaction  . Spironolactone Rash     Current Outpatient Prescriptions  Medication Sig Dispense Refill  . albuterol (PROVENTIL HFA) 108 (90 BASE) MCG/ACT inhaler Inhale 2 puffs into the lungs every 6 (six) hours as needed. For shortness of breath      . budesonide-formoterol (SYMBICORT) 80-4.5 MCG/ACT inhaler Inhale 2 puffs into the lungs 2 (two) times daily.      . carvedilol (COREG) 25 MG tablet Take 2 tablets (50 mg total) by mouth 2 (two) times daily. Take 2 tablets twice daily  120 tablet  3  . desloratadine (CLARINEX) 5 MG tablet Take 5 mg by mouth daily.      . ferrous sulfate (IRON SUPPLEMENT) 325 (65 FE) MG tablet Take 325 mg by mouth daily with breakfast.      . fish oil-omega-3 fatty acids 1000 MG capsule Take 3 g by mouth 3 (three) times daily.        . furosemide (LASIX) 40 MG tablet Take 2 tablets (80 mg total) by mouth 2 (two) times daily.  120 tablet  3  . gabapentin (NEURONTIN) 300 MG capsule Take 300 mg by mouth at bedtime.       Marland Kitchen levothyroxine (SYNTHROID, LEVOTHROID) 300 MCG tablet Take 300 mcg by mouth daily.        . Multiple Vitamin (MULTIVITAMIN) tablet Take 1 tablet by mouth daily.      Marland Kitchen oxyCODONE-acetaminophen (PERCOCET/ROXICET) 5-325 MG per tablet Take 2 tablets by mouth every 4  (four) hours as needed for pain.  15 tablet  0  . potassium chloride SA (K-DUR,KLOR-CON) 20 MEQ tablet Take 2 tablets (40 mEq total) by mouth 2 (two) times daily.  180 tablet  6  . quinapril (ACCUPRIL) 40 MG tablet Take 1 tablet (40 mg total) by mouth at bedtime.  30 tablet  12  . ranitidine (ZANTAC) 150 MG tablet Take 150 mg by mouth 2 (two) times daily as needed. For gastric reflux       No current facility-administered medications for this visit.   Facility-Administered Medications Ordered in Other Visits  Medication Dose Route Frequency Provider Last Rate Last Dose  . [COMPLETED] oxyCODONE-acetaminophen (PERCOCET/ROXICET) 5-325 MG per tablet 2 tablet  2 tablet Oral Once [employer]. Colon Branch, MD   2 tablet at 01/16/12 0408     Past Medical History  Diagnosis Date  . Hypertension     09/2010-normal CMet and CBC; Lipid profile-116, 88, 25, 73  . Cardiomyopathy 08/2010    Presented with congestive heart failure; EF of 15% and 2012; hypotension on medication precludes optimal dosing  . Gastroesophageal reflux disease   . Hypothyroidism     Recent TSH was normal.  . Pneumonia   . Obesity   . Hyperlipidemia   . Tobacco abuse     20 pack  years  . CHF (congestive heart failure)   . Asthma   . Shortness of breath   . Seizures   . Headache   . Arthritis   . Anxiety   . Neuropathy   . 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   Past Surgical History  Procedure Date  . Cesarean section     X2     Family History  Problem Relation Age of Onset  . Cardiomyopathy Mother     ICD pacemaker-ischmic CM  . Heart failure Mother   . Hypertension Mother   . Cardiomyopathy Father     Deceased  . Coronary artery disease Father   . Heart failure Father   . Heart failure Brother   . Hypertension Brother      History   Social History  . Marital Status: Divorced    Spouse Name: N/A    Number  of Children: 2  . Years of Education: N/A   Occupational History  .      Works in Science writer   Social History Main Topics  . Smoking status: Current Every Day Smoker -- 0.5 packs/day for 21 years    Types: Cigarettes  . Smokeless tobacco: Never Used  . Alcohol Use: No  . Drug Use: No  . Sexually Active: No   Other Topics Concern  . Not on file   Social History Narrative  . No narrative on file     BP 105/80  Pulse 62  Ht 5' 7.5" (1.715 m)  Wt 290 lb (131.543 kg)  BMI 44.75 kg/m2  Physical Exam:  Well appearing NAD HEENT: Unremarkable Neck:  No JVD, no thyromegally Lymphatics:  No adenopathy Back:  No CVA tenderness Lungs:  Clear HEART:  Regular rate rhythm, no murmurs, no rubs, no clicks Abd:  soft, positive bowel sounds, no organomegally, no rebound, no guarding Ext:  2 plus pulses, no edema, no cyanosis, no clubbing Skin:  No rashes no nodules Neuro:  CN II through XII intact, motor grossly intact   DEVICE  Normal device function.  See PaceArt for details.   Assess/Plan:

## 2012-01-25 ENCOUNTER — Ambulatory Visit (INDEPENDENT_AMBULATORY_CARE_PROVIDER_SITE_OTHER): Payer: Self-pay | Admitting: Cardiology

## 2012-01-25 ENCOUNTER — Encounter: Payer: Self-pay | Admitting: Cardiology

## 2012-01-25 ENCOUNTER — Encounter: Payer: Self-pay | Admitting: *Deleted

## 2012-01-25 VITALS — BP 98/60 | HR 65 | Wt 290.0 lb

## 2012-01-25 DIAGNOSIS — E039 Hypothyroidism, unspecified: Secondary | ICD-10-CM

## 2012-01-25 DIAGNOSIS — E785 Hyperlipidemia, unspecified: Secondary | ICD-10-CM

## 2012-01-25 DIAGNOSIS — E876 Hypokalemia: Secondary | ICD-10-CM

## 2012-01-25 DIAGNOSIS — I10 Essential (primary) hypertension: Secondary | ICD-10-CM

## 2012-01-25 DIAGNOSIS — G4700 Insomnia, unspecified: Secondary | ICD-10-CM

## 2012-01-25 DIAGNOSIS — I429 Cardiomyopathy, unspecified: Secondary | ICD-10-CM

## 2012-01-25 DIAGNOSIS — E782 Mixed hyperlipidemia: Secondary | ICD-10-CM

## 2012-01-25 DIAGNOSIS — E669 Obesity, unspecified: Secondary | ICD-10-CM

## 2012-01-25 DIAGNOSIS — I428 Other cardiomyopathies: Secondary | ICD-10-CM

## 2012-01-25 DIAGNOSIS — Z95810 Presence of automatic (implantable) cardiac defibrillator: Secondary | ICD-10-CM

## 2012-01-25 MED ORDER — ROSUVASTATIN CALCIUM 40 MG PO TABS: 40.0000 mg | ORAL_TABLET | Freq: Every day | ORAL | Status: DC

## 2012-01-25 MED ORDER — POTASSIUM CHLORIDE CRYS ER 20 MEQ PO TBCR: 20.0000 meq | EXTENDED_RELEASE_TABLET | Freq: Every day | ORAL | Status: DC

## 2012-01-25 MED ORDER — ATORVASTATIN CALCIUM 40 MG PO TABS: 40.0000 mg | ORAL_TABLET | Freq: Every day | ORAL | Status: DC

## 2012-01-25 NOTE — Assessment & Plan Note (Addendum)
Patient is virtually asymptomatic despite severely impaired ventricular systolic function; chronic systolic CHF is well compensated.  Treatment with Aldactone would be desirable, but patient has apparently developed a rash with this medication in the past.  The cost of eplerenone is prohibitive for this nice woman, but we could attempt to obtain a supply for her via the health department.

## 2012-01-25 NOTE — Assessment & Plan Note (Signed)
Blood pressure remains on the low side with medical therapy for cardiomyopathy and congestive heart failure.

## 2012-01-25 NOTE — Assessment & Plan Note (Signed)
Weight increased 15 pounds in recent months.  Patient attributes this to hypothyroidism.

## 2012-01-25 NOTE — Assessment & Plan Note (Signed)
Lipids are once again markedly elevated.  Statin therapy in the form of rosuvastatin 40 mg per day will be reinitiated with a repeat lipid profile to be obtained in one month.

## 2012-01-25 NOTE — Assessment & Plan Note (Signed)
We will continue to monitor electrolytes and to adjust medical therapy.

## 2012-01-25 NOTE — Progress Notes (Signed)
Patient ID: Melanie Cordova, female   DOB: 1960-06-29, 51 y.o.   MRN: [id number]  HPI: Scheduled return visit for this very nice woman with nonischemic cardiomyopathy.  Since her last visit, she has done quite well.  She describes class 2-3 dyspnea.  She was seen in the emergency department for back pain, which patient attributes to excessive abdominal gas.  Spine films were negative, and she was treated symptomatically.  She has noted no edema.  Her AICD has never discharged.  She is scheduled to be seen in device clinic in 04/2012.  She is now receiving most of her medications from the health department, but they have not yet supplied her with levothyroxine or ranitidine, both of which she has not taken for the past 2 months.  Prior to Admission medications   Medication Sig Start Date End Date Taking? Authorizing Provider  albuterol (PROVENTIL HFA) 108 (90 BASE) MCG/ACT inhaler Inhale 2 puffs into the lungs every 6 (six) hours as needed. For shortness of breath 01/16/12  Yes Marinus Maw, MD  budesonide-formoterol [facility]) 80-4.5 MCG/ACT inhaler Inhale 2 puffs into the lungs 2 (two) times daily. 01/16/12  Yes Marinus Maw, MD  carvedilol (COREG) 25 MG tablet Take 2 tablets (50 mg total) by mouth 2 (two) times daily. Take 2 tablets twice daily 01/16/12  Yes Marinus Maw, MD  desloratadine (CLARINEX) 5 MG tablet Take 5 mg by mouth daily.   Yes Historical Provider, MD  ferrous sulfate (IRON SUPPLEMENT) 325 (65 FE) MG tablet Take 325 mg by mouth daily with breakfast.   Yes Historical Provider, MD  fish oil-omega-3 fatty acids 1000 MG capsule Take 3 g by mouth 3 (three) times daily.     Yes Historical Provider, MD  furosemide (LASIX) 80 MG tablet Take 1 tablet (80 mg total) by mouth 2 (two) times daily. 01/16/12  Yes Marinus Maw, MD  gabapentin (NEURONTIN) 300 MG capsule Take 1 capsule (300 mg total) by mouth at bedtime. 01/16/12  Yes Marinus Maw, MD  Multiple Vitamin (MULTIVITAMIN) tablet Take  1 tablet by mouth daily.   Yes Historical Provider, MD  oxyCODONE-acetaminophen (PERCOCET/ROXICET) 5-325 MG per tablet Take 2 tablets by mouth every 4 (four) hours as needed for pain. 01/16/12  Yes Nicoletta Dress. Colon Branch, MD  potassium chloride SA (K-DUR,KLOR-CON) 20 MEQ tablet Take 2 tablets (40 mEq total) by mouth 2 (two) times daily. 01/16/12 01/15/13 Yes Marinus Maw, MD  quinapril (ACCUPRIL) 40 MG tablet Take 1 tablet (40 mg total) by mouth at bedtime. 01/16/12 01/15/13 Yes Marinus Maw, MD  levothyroxine (SYNTHROID, LEVOTHROID) 300 MCG tablet Take 1 tablet (300 mcg total) by mouth daily. 01/16/12   Marinus Maw, MD  ranitidine (ZANTAC) 150 MG tablet Take 150 mg by mouth 2 (two) times daily as needed. For gastric reflux    Historical Provider, MD   Allergies  Allergen Reactions  . Penicillins Anaphylaxis and Shortness Of Breath    Hair loss  . Iodinated Diagnostic Agents Hives and Other (See Comments)    Pulmonary problems; no frank respiratory arrest  . Iodine   . Sulfa Antibiotics Other (See Comments)    Pt can't remember reaction  . Spironolactone Rash     Past medical history, social history, and family history reviewed and updated.  ROS: Denies orthopnea, PND, wheezing, edema or chest pain.  She describes borborygmi, excessive eructation and excessive flatulence.  All other systems reviewed and are negative.  PHYSICAL EXAM: BP 98/60  Pulse 65  Wt 131.543 kg (290 lb)  SpO2 97%  General-Well developed; no acute distress; poor dentition; hoarse voice Body habitus-Obese Neck-No JVD; no carotid bruits Lungs-clear lung fields; resonant to percussion Cardiovascular-normal PMI; normal S1 and Increased intensity of S2 Abdomen-normal bowel sounds; soft and non-tender without masses or organomegaly Musculoskeletal-No deformities, no cyanosis or clubbing Neurologic-Normal cranial nerves; symmetric strength and tone Skin-Warm, no significant lesions Extremities-distal pulses intact; no  edema  ASSESSMENT AND PLAN:  Orcutt Bing, MD 01/25/2012 11:37 AM

## 2012-01-25 NOTE — Progress Notes (Deleted)
Name: Melanie Cordova    DOB: 1961/03/07  Age: 51 y.o.  MR#: [id number]       PCP:  [name], NP      Insurance: @PAYORNAME @   CC:   No chief complaint on file.  MEDICATION BOTTLES - HAS BEEN ABLE TO RESTART ALL OF HER MEDICATIONS VIA [facility], WITH THE EXCEPTION OF LEVOTHYROXINE (AWAITING ARRIVAL) AND RANITIDINE (UNABLE TO OBTAIN THRU HD.  NOT CURRENTLY ON MEDICATION FOR CHOLESTEROL.  WAS DISCONTINUED MONTHS AGO, WITH A LIPID PROFILE OBTAINED OFF OF DRUG, WHICH WAS SUB OPTIMAL AND TO BE DISCUSSED AT TODAYS VISIT.  VS BP 98/60  Pulse 65  Wt 290 lb (131.543 kg)  SpO2 97%  Weights Current Weight  01/25/12 290 lb (131.543 kg)  01/16/12 290 lb (131.543 kg)  01/16/12 265 lb (120.203 kg)    Blood Pressure  BP Readings from Last 3 Encounters:  01/25/12 98/60  01/16/12 105/80  01/16/12 114/78     Admit date:  (Not on file) Last encounter with RMR:  01/10/2012   Allergy Allergies  Allergen Reactions  . Penicillins Anaphylaxis and Shortness Of Breath    Hair loss  . Iodinated Diagnostic Agents Hives and Other (See Comments)    Pulmonary problems; no frank respiratory arrest  . Iodine   . Sulfa Antibiotics Other (See Comments)    Pt can't remember reaction  . Spironolactone Rash    Current Outpatient Prescriptions  Medication Sig Dispense Refill  . albuterol (PROVENTIL HFA) 108 (90 BASE) MCG/ACT inhaler Inhale 2 puffs into the lungs every 6 (six) hours as needed. For shortness of breath  1 Inhaler  6  . budesonide-formoterol (SYMBICORT) 80-4.5 MCG/ACT inhaler Inhale 2 puffs into the lungs 2 (two) times daily.  1 Inhaler  6  . carvedilol (COREG) 25 MG tablet Take 2 tablets (50 mg total) by mouth 2 (two) times daily. Take 2 tablets twice daily  120 tablet  6  . desloratadine (CLARINEX) 5 MG tablet Take 5 mg by mouth daily.      . ferrous sulfate (IRON SUPPLEMENT) 325 (65 FE) MG tablet Take 325 mg by mouth daily with breakfast.      . fish oil-omega-3 fatty acids 1000 MG capsule  Take 3 g by mouth 3 (three) times daily.        . furosemide (LASIX) 80 MG tablet Take 1 tablet (80 mg total) by mouth 2 (two) times daily.  60 tablet  6  . gabapentin (NEURONTIN) 300 MG capsule Take 1 capsule (300 mg total) by mouth at bedtime.  30 capsule  6  . Multiple Vitamin (MULTIVITAMIN) tablet Take 1 tablet by mouth daily.      Marland Kitchen oxyCODONE-acetaminophen (PERCOCET/ROXICET) 5-325 MG per tablet Take 2 tablets by mouth every 4 (four) hours as needed for pain.  15 tablet  0  . potassium chloride SA (K-DUR,KLOR-CON) 20 MEQ tablet Take 2 tablets (40 mEq total) by mouth 2 (two) times daily.  120 tablet  6  . quinapril (ACCUPRIL) 40 MG tablet Take 1 tablet (40 mg total) by mouth at bedtime.  30 tablet  6  . levothyroxine (SYNTHROID, LEVOTHROID) 300 MCG tablet Take 1 tablet (300 mcg total) by mouth daily.  30 tablet  6  . ranitidine (ZANTAC) 150 MG tablet Take 150 mg by mouth 2 (two) times daily as needed. For gastric reflux        Discontinued Meds:   There are no discontinued medications.  Patient Active Problem List  Diagnosis  .  Obesity  . Hypertension  . Cardiomyopathy  . Gastroesophageal reflux disease  . Hypothyroidism  . Tobacco abuse  . Hyperlipidemia  . Insomnia  . Chronic systolic heart failure  . Chest pain  . Hypokalemia  . ICD -St.Jude  . Dizziness  . TIA (transient ischemic attack)    LABS Office Visit on 01/16/2012  Component Date Value  . DEVICE MODEL ICD 01/16/2012 2725366   . DEV-0014ICD 01/16/2012 Lewayne Bunting   M.D.   . Nira Conn 01/16/2012 N   . [id number] 01/16/2012 Lewayne Bunting   M.D.   . PACEART TECH NOTES ICD 01/16/2012                     Value:ICD check in clinic. Normal device function. Thresholds and sensing consistent with previous device measurements. Impedance trends stable over time. No evidence of any ventricular arrhythmias. No mode switches. Histogram distribution appropriate for                          patient and level of activity.  No changes made this session. Device programmed at appropriate safety margins. Device programmed to optimize intrinsic conduction. Patient education completed including shock plan. Alert tones/vibration demonstrated for                          patient. CorVue programmed on today.  ROV 3 months with the device clinic.  Marland Kitchen RV LEAD IMPEDENCE ICD 01/16/2012 537.5   . HV IMPEDENCE 01/16/2012 73   . VF 01/16/2012 0   . FVT 01/16/2012 0   . PACEART VT 01/16/2012 0   . VENTRICULAR PACING ICD 01/16/2012 0.07   . TOT-0007 01/16/2012 1   . TOT-0008 01/16/2012 0   . TOT-0009 01/16/2012 0   . TOT-0010 01/16/2012 1   . RV LEAD AMPLITUDE 01/16/2012 12.0   . RV LEAD THRESHOLD 01/16/2012 0.5   . BRDY-0001RV 01/16/2012 VVI   . BRDY-0002RV 01/16/2012 40   . BRDY-0005RV 01/16/2012 Off   . BRDY-0009RV 01/16/2012 No   . TACH-0003RV 01/16/2012 Enabled   Orders Only on 12/30/2011  Component Date Value  . Sodium 12/30/2011 140   . Potassium 12/30/2011 4.4   . Chloride 12/30/2011 99   . CO2 12/30/2011 32   . Glucose, Bld 12/30/2011 82   . BUN 12/30/2011 13   . Creat 12/30/2011 1.16*  . Calcium 12/30/2011 9.7   . Cholesterol 12/30/2011 296*  . Triglycerides 12/30/2011 465*  . HDL 12/30/2011 39*  . Total CHOL/HDL Ratio 12/30/2011 7.6   . VLDL 12/30/2011 NOT CALC   . LDL Cholesterol 12/30/2011    Admission on 11/06/2011, Discharged on 11/07/2011  Component Date Value  . WBC 11/06/2011 11.2*  . RBC 11/06/2011 4.32   . Hemoglobin 11/06/2011 13.4   . HCT 11/06/2011 39.8   . MCV 11/06/2011 92.1   . [facility] 11/06/2011 31.0   . MCHC 11/06/2011 33.7   . RDW 11/06/2011 13.9   . Platelets 11/06/2011 220   . Neutrophils Relative 11/06/2011 64   . Neutro Abs 11/06/2011 7.2   . Lymphocytes Relative 11/06/2011 25   . Lymphs Abs 11/06/2011 2.8   . Monocytes Relative 11/06/2011 7   . Monocytes Absolute 11/06/2011 0.7   . Eosinophils Relative 11/06/2011 4   . Eosinophils Absolute 11/06/2011 0.4   . Basophils  Relative 11/06/2011 1   . Basophils Absolute 11/06/2011 0.1   . Sodium 11/06/2011 137   .  Potassium 11/06/2011 3.0*  . Chloride 11/06/2011 99   . CO2 11/06/2011 31   . Glucose, Bld 11/06/2011 94   . BUN 11/06/2011 12   . Creatinine, Ser 11/06/2011 0.85   . Calcium 11/06/2011 8.9   . GFR calc non Af Amer 11/06/2011 79*  . GFR calc Af Amer 11/06/2011 >90   . Troponin I 11/06/2011 <0.30   . Prothrombin Time 11/06/2011 12.2   . INR 11/06/2011 0.89   . aPTT 11/06/2011 24   . Total CK 11/06/2011 49   . CK, MB 11/06/2011 1.5   . Relative Index 11/06/2011 RELATIVE INDEX IS INVALID   . Opiates 11/06/2011 NONE DETECTED   . Cocaine 11/06/2011 NONE DETECTED   . Benzodiazepines 11/06/2011 NONE DETECTED   . Amphetamines 11/06/2011 NONE DETECTED   . Tetrahydrocannabinol 11/06/2011 NONE DETECTED   . Barbiturates 11/06/2011 NONE DETECTED   . Alcohol, Ethyl (B) 11/06/2011 <11   . Total Protein 11/06/2011 6.4   . Albumin 11/06/2011 3.4*  . AST 11/06/2011 15   . ALT 11/06/2011 19   . Alkaline Phosphatase 11/06/2011 99   . Total Bilirubin 11/06/2011 0.3   . Bilirubin, Direct 11/06/2011 0.1   . Indirect Bilirubin 11/06/2011 0.2*  . Glucose-Capillary 11/06/2011 94   . TSH 11/06/2011 1.397   . Hemoglobin A1C 11/06/2011 5.5   . Mean Plasma Glucose 11/06/2011 111   . Cholesterol 11/07/2011 198   . Triglycerides 11/07/2011 231*  . HDL 11/07/2011 32*  . Total CHOL/HDL Ratio 11/07/2011 6.2   . VLDL 11/07/2011 46*  . LDL Cholesterol 11/07/2011 120*  . Total CK 11/07/2011 35   . CK, MB 11/07/2011 1.3   . Troponin I 11/07/2011 <0.30   . Relative Index 11/07/2011 RELATIVE INDEX IS INVALID   . Total CK 11/07/2011 32   . CK, MB 11/07/2011 1.2   . Troponin I 11/07/2011 <0.30   . Relative Index 11/07/2011 RELATIVE INDEX IS INVALID   . WBC 11/07/2011 8.0   . RBC 11/07/2011 4.13   . Hemoglobin 11/07/2011 12.7   . HCT 11/07/2011 38.2   . MCV 11/07/2011 92.5   . [facility] 11/07/2011 30.8   . MCHC  11/07/2011 33.2   . RDW 11/07/2011 14.1   . Platelets 11/07/2011 211   . Sodium 11/07/2011 139   . Potassium 11/07/2011 3.1*  . Chloride 11/07/2011 102   . CO2 11/07/2011 30   . Glucose, Bld 11/07/2011 89   . BUN 11/07/2011 9   . Creatinine, Ser 11/07/2011 0.78   . Calcium 11/07/2011 8.8   . Total Protein 11/07/2011 6.0   . Albumin 11/07/2011 3.1*  . AST 11/07/2011 15   . ALT 11/07/2011 16   . Alkaline Phosphatase 11/07/2011 87   . Total Bilirubin 11/07/2011 0.4   . GFR calc non Af Amer 11/07/2011 >90   . GFR calc Af Amer 11/07/2011 >90      Results for this Opt Visit:     Results for orders placed in visit on 01/16/12  ICD DEVICE OBSERVATION      Component Value Range   DEVICE MODEL ICD 1610960     DEV-0014ICD Lewayne Bunting   M.D.     DEV-0020ICD N     DEV-0014LDO Lewayne Bunting   M.D.     [facility] NOTES ICD       Value: ICD check in clinic. Normal device function. Thresholds and sensing consistent with previous device measurements. Impedance trends stable over time. No evidence of any ventricular  arrhythmias. No mode switches. Histogram distribution appropriate for      patient and level of activity. No changes made this session. Device programmed at appropriate safety margins. Device programmed to optimize intrinsic conduction. Patient education completed including shock plan. Alert tones/vibration demonstrated for      patient. CorVue programmed on today.  ROV 3 months with the device clinic.   RV LEAD IMPEDENCE ICD 537.5     HV IMPEDENCE 73     VF 0     FVT 0     PACEART VT 0     VENTRICULAR PACING ICD 0.07     TOT-0007 1     TOT-0008 0     TOT-0009 0     TOT-0010 1     RV LEAD AMPLITUDE 12.0     RV LEAD THRESHOLD 0.5     BRDY-0001RV VVI     BRDY-0002RV 40     BRDY-0005RV Off     BRDY-0009RV No     TACH-0003RV Enabled      EKG Orders placed in visit on 01/16/12  . EKG 12-LEAD     Prior Assessment and Plan Problem List as of 01/25/2012             Cardiology Problems   Hypertension   Last Assessment & Plan Note   01/16/2012 Office Visit Signed 01/16/2012  9:04 AM by Marinus Maw, MD    Her blood pressure is well controlled today, despite being in significant pain in her lower back.    Cardiomyopathy   Last Assessment & Plan Note   08/23/2011 Office Visit Signed 08/23/2011  4:42 PM by Kathlen Brunswick, MD    Ms. Baldi continues to do quite well symptomatically despite very severe impairment in left ventricular systolic function.  Although there has been no improvement in EF with medical therapy, she has remained stable after initially presenting with pulmonary edema.  Doses of furosemide, carvedilol and lisinopril are optimized.  Allergy to spironolactone precludes use of that agent.    Hyperlipidemia   Last Assessment & Plan Note   08/23/2011 Office Visit Signed 08/23/2011  4:43 PM by Kathlen Brunswick, MD    Patient was previously treated with a statin, but this is no longer included in her medical regime.  Lipid profile will be repeated off drugs.    Chronic systolic heart failure   Last Assessment & Plan Note   01/16/2012 Office Visit Signed 01/16/2012  9:03 AM by Marinus Maw, MD    Her symptoms range from class II to class III. I have encouraged her to reduce her sodium intake, and continue her current medical therapy.    TIA (transient ischemic attack)     Other   Obesity   Last Assessment & Plan Note   08/01/2011 Office Visit Signed 08/01/2011  2:18 PM by Kathlen Brunswick, MD    Patient is congratulated on not gaining weight while she was attempting to discontinue tobacco use.    Gastroesophageal reflux disease   Hypothyroidism   Last Assessment & Plan Note   03/16/2011 Office Visit Addendum 03/20/2011 11:58 AM by Kathlen Brunswick, MD    Hypothyroidism was reassessed with a normal TSH in 12/2010.      Tobacco abuse   Last Assessment & Plan Note   08/01/2011 Office Visit Signed 08/01/2011  6:48 PM by Kathlen Brunswick, MD      Complete cessation of smoking advised.    Insomnia   Chest  pain   Hypokalemia   ICD -St.Jude   Last Assessment & Plan Note   01/16/2012 Office Visit Signed 01/16/2012  9:03 AM by Marinus Maw, MD    Her device is working normally. We'll plan to recheck in several months.    Dizziness       Imaging: Dg Lumbar Spine Complete  01/16/2012  *RADIOLOGY REPORT*  Clinical Data: Severe right lower back pain and back pain.  LUMBAR SPINE - COMPLETE 4+ VIEW  Comparison: None.  Findings: There is no evidence of fracture or subluxation. Vertebral bodies demonstrate normal height and alignment. Intervertebral disc spaces are preserved.  The visualized neural foramina are grossly unremarkable in appearance.  The visualized bowel gas pattern is unremarkable in appearance; air and stool are noted within the colon.  The sacroiliac joints are within normal limits.  Prominent calcification is noted at the distal abdominal aorta.  IMPRESSION:  1.  No evidence of fracture or subluxation along the lumbar spine. 2.  Prominent calcification along the distal abdominal aorta.   Original Report Authenticated By: Tonia Ghent, M.D.      [facility] Calculation: Score not calculated. Missing: Total Cholesterol

## 2012-01-25 NOTE — Assessment & Plan Note (Signed)
Patient is likely hypothyroid once again.  We have arranged with [facility] to resume thyroid replacement.

## 2012-01-25 NOTE — Patient Instructions (Addendum)
Your physician recommends that you schedule a follow-up appointment in: 6 months  Your physician recommends that you return for lab work in:  1 month  Your physician has recommended you make the following change in your medication:  1 - START Crestor 40 mg dialy

## 2012-01-26 NOTE — Assessment & Plan Note (Signed)
Patient was encouraged to sleep only at night and to avoid naps during the day, especially the 2-3 hour sojourns that she has been taking.  We will provide her with short acting hypnotics to try to reset her sleep schedule.  She does not snore and does not have known sleep apnea although she has never undergone a sleep study.  This might be a useful test in the future.

## 2012-01-27 ENCOUNTER — Encounter: Payer: Self-pay | Admitting: *Deleted

## 2012-02-06 ENCOUNTER — Telehealth: Payer: Self-pay | Admitting: Cardiology

## 2012-02-06 NOTE — Telephone Encounter (Signed)
Advised patient to bring me the letter and I would look into it for her.

## 2012-02-06 NOTE — Telephone Encounter (Signed)
Pt wants to speak with Melanie Cordova regarding letter she has from a drug company.

## 2012-02-13 ENCOUNTER — Other Ambulatory Visit: Payer: Self-pay | Admitting: Cardiology

## 2012-02-13 NOTE — Telephone Encounter (Signed)
PT NEEDS REFILL ON K-DUR, BUT WAS ASKING IF WE STILL WANTED HER TO BE ON THIS

## 2012-02-14 ENCOUNTER — Other Ambulatory Visit: Payer: Self-pay | Admitting: *Deleted

## 2012-02-14 MED ORDER — GABAPENTIN 300 MG PO CAPS: 300.0000 mg | ORAL_CAPSULE | Freq: Every day | ORAL | Status: DC

## 2012-02-14 MED ORDER — POTASSIUM CHLORIDE CRYS ER 20 MEQ PO TBCR: 40.0000 meq | EXTENDED_RELEASE_TABLET | Freq: Two times a day (BID) | ORAL | Status: DC

## 2012-02-15 ENCOUNTER — Other Ambulatory Visit: Payer: Self-pay | Admitting: *Deleted

## 2012-02-15 DIAGNOSIS — E039 Hypothyroidism, unspecified: Secondary | ICD-10-CM

## 2012-02-15 DIAGNOSIS — E782 Mixed hyperlipidemia: Secondary | ICD-10-CM

## 2012-02-15 DIAGNOSIS — I10 Essential (primary) hypertension: Secondary | ICD-10-CM

## 2012-02-22 ENCOUNTER — Other Ambulatory Visit: Payer: Self-pay | Admitting: Cardiology

## 2012-02-22 LAB — LIPID PANEL
Cholesterol: 203 mg/dL — ABNORMAL HIGH (ref 0–200)
HDL: 29 mg/dL — ABNORMAL LOW (ref >39)
Total CHOL/HDL Ratio: 7 Ratio
VLDL: 57 mg/dL — ABNORMAL HIGH (ref 0–40)

## 2012-02-22 LAB — BASIC METABOLIC PANEL
Glucose, Bld: 96 mg/dL (ref 70–99)
Potassium: 4.2 mEq/L (ref 3.5–5.3)
Sodium: 144 mEq/L (ref 135–145)

## 2012-02-25 ENCOUNTER — Encounter: Payer: Self-pay | Admitting: Cardiology

## 2012-02-28 ENCOUNTER — Encounter: Payer: Self-pay | Admitting: *Deleted

## 2012-03-20 ENCOUNTER — Encounter (HOSPITAL_COMMUNITY): Payer: Self-pay

## 2012-03-20 ENCOUNTER — Emergency Department (HOSPITAL_COMMUNITY): Payer: Medicaid Other

## 2012-03-20 ENCOUNTER — Emergency Department (HOSPITAL_COMMUNITY)
Admission: EM | Admit: 2012-03-20 | Discharge: 2012-03-20 | Disposition: A | Discharge status: Home / Self Care | Payer: Medicaid Other | Attending: Emergency Medicine | Admitting: Emergency Medicine

## 2012-03-20 DIAGNOSIS — Z79899 Other long term (current) drug therapy: Secondary | ICD-10-CM | POA: Insufficient documentation

## 2012-03-20 DIAGNOSIS — J40 Bronchitis, not specified as acute or chronic: Secondary | ICD-10-CM | POA: Insufficient documentation

## 2012-03-20 DIAGNOSIS — R05 Cough: Secondary | ICD-10-CM | POA: Insufficient documentation

## 2012-03-20 DIAGNOSIS — I509 Heart failure, unspecified: Secondary | ICD-10-CM | POA: Insufficient documentation

## 2012-03-20 DIAGNOSIS — J45909 Unspecified asthma, uncomplicated: Secondary | ICD-10-CM | POA: Insufficient documentation

## 2012-03-20 DIAGNOSIS — E039 Hypothyroidism, unspecified: Secondary | ICD-10-CM | POA: Insufficient documentation

## 2012-03-20 DIAGNOSIS — K219 Gastro-esophageal reflux disease without esophagitis: Secondary | ICD-10-CM | POA: Insufficient documentation

## 2012-03-20 DIAGNOSIS — Z8701 Personal history of pneumonia (recurrent): Secondary | ICD-10-CM | POA: Insufficient documentation

## 2012-03-20 DIAGNOSIS — Z8739 Personal history of other diseases of the musculoskeletal system and connective tissue: Secondary | ICD-10-CM | POA: Insufficient documentation

## 2012-03-20 DIAGNOSIS — R6883 Chills (without fever): Secondary | ICD-10-CM | POA: Insufficient documentation

## 2012-03-20 DIAGNOSIS — R059 Cough, unspecified: Secondary | ICD-10-CM | POA: Insufficient documentation

## 2012-03-20 DIAGNOSIS — E669 Obesity, unspecified: Secondary | ICD-10-CM | POA: Insufficient documentation

## 2012-03-20 DIAGNOSIS — Z8679 Personal history of other diseases of the circulatory system: Secondary | ICD-10-CM | POA: Insufficient documentation

## 2012-03-20 DIAGNOSIS — F17200 Nicotine dependence, unspecified, uncomplicated: Secondary | ICD-10-CM | POA: Insufficient documentation

## 2012-03-20 DIAGNOSIS — J3489 Other specified disorders of nose and nasal sinuses: Secondary | ICD-10-CM | POA: Insufficient documentation

## 2012-03-20 DIAGNOSIS — Z8669 Personal history of other diseases of the nervous system and sense organs: Secondary | ICD-10-CM | POA: Insufficient documentation

## 2012-03-20 DIAGNOSIS — E785 Hyperlipidemia, unspecified: Secondary | ICD-10-CM | POA: Insufficient documentation

## 2012-03-20 DIAGNOSIS — R61 Generalized hyperhidrosis: Secondary | ICD-10-CM | POA: Insufficient documentation

## 2012-03-20 DIAGNOSIS — I10 Essential (primary) hypertension: Secondary | ICD-10-CM | POA: Insufficient documentation

## 2012-03-20 MED ORDER — AZITHROMYCIN 250 MG PO TABS: ORAL_TABLET | ORAL | Status: DC

## 2012-03-20 MED ORDER — HYDROCOD POLST-CHLORPHEN POLST 10-8 MG/5ML PO LQCR
5.0000 mL | Freq: Once | ORAL | Status: AC
  Administered 2012-03-20: 5 mL via ORAL
  Filled 2012-03-20: qty 5

## 2012-03-20 MED ORDER — AZITHROMYCIN 250 MG PO TABS
500.0000 mg | ORAL_TABLET | Freq: Once | ORAL | Status: AC
  Administered 2012-03-20: 500 mg via ORAL
  Filled 2012-03-20: qty 2

## 2012-03-20 MED ORDER — IPRATROPIUM BROMIDE 0.02 % IN SOLN
0.5000 mg | Freq: Once | RESPIRATORY_TRACT | Status: AC
  Administered 2012-03-20: 0.5 mg via RESPIRATORY_TRACT
  Filled 2012-03-20: qty 2.5

## 2012-03-20 MED ORDER — ALBUTEROL SULFATE (5 MG/ML) 0.5% IN NEBU
5.0000 mg | INHALATION_SOLUTION | Freq: Once | RESPIRATORY_TRACT | Status: AC
  Administered 2012-03-20: 5 mg via RESPIRATORY_TRACT
  Filled 2012-03-20: qty 1

## 2012-03-20 MED ORDER — HYDROCODONE-ACETAMINOPHEN 7.5-500 MG/15ML PO SOLN: ORAL | Status: DC

## 2012-03-20 NOTE — ED Notes (Signed)
Pt to xray

## 2012-03-20 NOTE — ED Notes (Signed)
Pt reports cough, congestion, and sob for the past few days.  Per ems, pt was given albuterol treatment during transport.

## 2012-03-20 NOTE — ED Provider Notes (Signed)
History  This chart was scribed for Melanie Hutching, MD by Ardeen Jourdain, ED Scribe. This patient was seen in room APA18/APA18 and the patient's care was started at 1227.  CSN: [id number]  Arrival date & time 03/20/12  1218   First MD Initiated Contact with Patient 03/20/12 1227      Chief Complaint  Patient presents with  . Shortness of Breath  . Cough     The history is provided by the patient. No language interpreter was used.    Melanie Cordova is a 52 y.o. female brought in by ambulance, who presents to the Emergency Department complaining of cough with associated congestion and SOB. She states she has had the gradually worsening symptoms for the past few days.  Complains of productive cough, chills, night sweats.  No fever, chest pain, stiff neck.  Has been taking oral fluids well. Nothing makes symptoms better or worse. Severity is mild to moderate    Past Medical History  Diagnosis Date  . Hypertension     09/2010-normal CMet and CBC; Lipid profile-116, 88, 25, 73  . Cardiomyopathy 08/2010    Presented with congestive heart failure; EF of 15% and 2012; hypotension on medication precludes optimal dosing  . Gastroesophageal reflux disease   . Hypothyroidism     Recent TSH was normal.  . Pneumonia   . Obesity   . Hyperlipidemia   . Tobacco abuse     20 pack years  . CHF (congestive heart failure)   . Asthma   . Shortness of breath   . Seizures   . Headache   . Arthritis   . Anxiety   . Neuropathy   . 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 Cesarean section     X2    Family History  Problem Relation Age of Onset  . Cardiomyopathy Mother     ICD pacemaker-ischmic CM  . Heart failure Mother   . Hypertension Mother   . Cardiomyopathy Father     Deceased  . Coronary artery disease Father   . Heart failure Father   . Heart failure Brother   . Hypertension Brother     History  Substance Use  Topics  . Smoking status: Current Every Day Smoker -- 0.5 packs/day for 21 years    Types: Cigarettes  . Smokeless tobacco: Never Used  . Alcohol Use: No   No OB history available.   Review of Systems  All other systems reviewed and are negative.   A complete 10 system review of systems was obtained and all systems are negative except as noted in the HPI and PMH.    Allergies  Penicillins; Iodinated diagnostic agents; Iodine; Sulfa antibiotics; and Spironolactone  Home Medications   Current Outpatient Rx  Name  Route  Sig  Dispense  Refill  . ALBUTEROL SULFATE HFA 108 (90 BASE) MCG/ACT IN AERS   Inhalation   Inhale 2 puffs into the lungs every 6 (six) hours as needed. For shortness of breath   1 Inhaler   6   . BUDESONIDE-FORMOTEROL FUMARATE 80-4.5 MCG/ACT IN AERO   Inhalation   Inhale 2 puffs into the lungs 2 (two) times daily.   1 Inhaler   6   . CARVEDILOL 25 MG PO TABS   Oral   Take 2 tablets (50 mg total) by mouth 2 (two) times daily. Take 2 tablets twice daily   120 tablet  6   . DESLORATADINE 5 MG PO TABS   Oral   Take 5 mg by mouth daily.         Marland Kitchen FERROUS SULFATE 325 (65 FE) MG PO TABS   Oral   Take 325 mg by mouth daily with breakfast.         . OMEGA-3 FATTY ACIDS 1000 MG PO CAPS   Oral   Take 3 g by mouth 3 (three) times daily.           . FUROSEMIDE 80 MG PO TABS   Oral   Take 1 tablet (80 mg total) by mouth 2 (two) times daily.   60 tablet   6   . GABAPENTIN 300 MG PO CAPS   Oral   Take 1 capsule (300 mg total) by mouth at bedtime.   30 capsule   6   . LEVOTHYROXINE SODIUM 300 MCG PO TABS   Oral   Take 1 tablet (300 mcg total) by mouth daily.   30 tablet   6   . ONE-DAILY MULTI VITAMINS PO TABS   Oral   Take 1 tablet by mouth daily.         . OXYCODONE-ACETAMINOPHEN 5-325 MG PO TABS   Oral   Take 2 tablets by mouth every 4 (four) hours as needed for pain.   15 tablet   0   . POTASSIUM CHLORIDE CRYS ER 20 MEQ PO  TBCR   Oral   Take 2 tablets (40 mEq total) by mouth 2 (two) times daily.   120 tablet   6   . QUINAPRIL HCL 40 MG PO TABS   Oral   Take 1 tablet (40 mg total) by mouth at bedtime.   30 tablet   6   . RANITIDINE HCL 150 MG PO TABS   Oral   Take 150 mg by mouth 2 (two) times daily as needed. For gastric reflux         . ROSUVASTATIN CALCIUM 40 MG PO TABS   Oral   Take 1 tablet (40 mg total) by mouth daily.   90 tablet   3     Triage Vitals: BP 120/85  Pulse 89  Temp 98.5 F (36.9 C) (Oral)  Resp 18  SpO2 96%  Physical Exam  Nursing note and vitals reviewed. Constitutional: She is oriented to person, place, and time. She appears well-developed and well-nourished.  HENT:  Head: Normocephalic and atraumatic.  Eyes: Conjunctivae normal and EOM are normal. Pupils are equal, round, and reactive to light.  Neck: Normal range of motion. Neck supple.  Cardiovascular: Normal rate, regular rhythm and normal heart sounds.   Pulmonary/Chest: Effort normal and breath sounds normal.  Abdominal: Soft. Bowel sounds are normal.  Musculoskeletal: Normal range of motion.  Neurological: She is alert and oriented to person, place, and time.  Skin: Skin is warm and dry.  Psychiatric: She has a normal mood and affect.    ED Course  Procedures (including critical care time)  DIAGNOSTIC STUDIES: Oxygen Saturation is 96% on room air, adequate by my interpretation.    COORDINATION OF CARE:  12:33 PMDiscussed treatment plan with pt at bedside and pt agreed to plan.     Labs Reviewed - No data to display No results found.   No diagnosis found.   Date: 03/20/2012  Rate: 87  Rhythm: normal sinus rhythm  QRS Axis: normal  Intervals: normal  ST/T Wave abnormalities: normal  Conduction Disutrbances:none  Narrative  Interpretation:   Old EKG Reviewed: changes noted PVC's   Dg Chest 2 View  03/20/2012  *RADIOLOGY REPORT*  Clinical Data: Shortness of breath and cough  CHEST - 2  VIEW  Comparison: November 06, 2011.  Findings:  Pacemaker lead is attached to the right ventricle.  No edema or consolidation.  Heart is upper normal in size with normal pulmonary vascularity.  No adenopathy.  There is a degree of underlying emphysema. No pneumothorax.  IMPRESSION:  Underlying emphysema.  No edema or consolidation.   Original Report Authenticated By: Bretta Bang, M.D.    MDM  Chest x-ray shows no pneumonia.  Patient is hemodynamic stable. Feels better after breathing treatment. Discharge home on Zithromax and Lortab elixir for cough.  Normal respirations      I personally performed the services described in this documentation, which was scribed in my presence. The recorded information has been reviewed and is accurate.    Melanie Hutching, MD 03/20/12 1501

## 2012-04-04 ENCOUNTER — Telehealth: Payer: Self-pay | Admitting: Cardiology

## 2012-04-04 NOTE — Telephone Encounter (Signed)
For an unknown reason, patient's carvedilol was omitted from her mediation list at last OV.  Call made to med assist to reorder Carvedilol 25 mg tablets 2 tablets twice daily.  Patient notified.

## 2012-04-04 NOTE — Telephone Encounter (Signed)
PATIENT STATES CALLED IN FOR REFILL OF CARVEDILOL 25 MG, BUT WAS TOLD IT HAD BEEN DELETED.  SHE HAS QUESTIONS.  PLEASE CALL.

## 2012-04-30 ENCOUNTER — Telehealth: Payer: Self-pay | Admitting: Cardiology

## 2012-04-30 MED ORDER — FUROSEMIDE 40 MG PO TABS: 80.0000 mg | ORAL_TABLET | Freq: Two times a day (BID) | ORAL | Status: DC

## 2012-04-30 MED ORDER — ROSUVASTATIN CALCIUM 40 MG PO TABS: 40.0000 mg | ORAL_TABLET | Freq: Every day | ORAL | Status: DC

## 2012-04-30 MED ORDER — CARVEDILOL 25 MG PO TABS: 50.0000 mg | ORAL_TABLET | Freq: Two times a day (BID) | ORAL | Status: DC

## 2012-04-30 MED ORDER — POTASSIUM CHLORIDE CRYS ER 20 MEQ PO TBCR: 40.0000 meq | EXTENDED_RELEASE_TABLET | Freq: Two times a day (BID) | ORAL | Status: DC

## 2012-04-30 NOTE — Telephone Encounter (Signed)
PT HAS HAD A CHANGE IN INSURANCE AND NEEDS ALL HER MEDICATIONS CALLED IN TO [facility]

## 2012-04-30 NOTE — Telephone Encounter (Signed)
Done

## 2012-05-01 ENCOUNTER — Telehealth: Payer: Self-pay | Admitting: Cardiology

## 2012-05-01 ENCOUNTER — Other Ambulatory Visit: Payer: Self-pay | Admitting: Cardiology

## 2012-05-01 MED ORDER — ROSUVASTATIN CALCIUM 40 MG PO TABS: 40.0000 mg | ORAL_TABLET | Freq: Every day | ORAL | Status: DC

## 2012-05-01 MED ORDER — CARVEDILOL 25 MG PO TABS: 50.0000 mg | ORAL_TABLET | Freq: Two times a day (BID) | ORAL | Status: DC

## 2012-05-01 NOTE — Telephone Encounter (Signed)
Patient would like return phone call to discuss medications. / tgs °

## 2012-05-01 NOTE — Telephone Encounter (Signed)
Clarified to patient that medications were called in , as she requested yesterday to [place].  Will supply with Crestor samples.

## 2012-05-07 ENCOUNTER — Telehealth: Payer: Self-pay | Admitting: Cardiology

## 2012-05-07 NOTE — Telephone Encounter (Signed)
RX WAS CALLED IN  FOR CARVEDILOL, THE DOSE WAS INCREASED THEY ARE CALLING TO MAKE SURE THIS IS CORRECT.

## 2012-05-07 NOTE — Telephone Encounter (Signed)
Clarified with pharmacist

## 2012-05-11 ENCOUNTER — Emergency Department (HOSPITAL_COMMUNITY): Payer: Medicaid Other

## 2012-05-11 ENCOUNTER — Encounter (HOSPITAL_COMMUNITY): Payer: Self-pay

## 2012-05-11 ENCOUNTER — Emergency Department (HOSPITAL_COMMUNITY)
Admission: EM | Admit: 2012-05-11 | Discharge: 2012-05-11 | Disposition: A | Discharge status: Home / Self Care | Payer: Medicaid Other | Attending: Emergency Medicine | Admitting: Emergency Medicine

## 2012-05-11 DIAGNOSIS — R5383 Other fatigue: Secondary | ICD-10-CM | POA: Insufficient documentation

## 2012-05-11 DIAGNOSIS — R5381 Other malaise: Secondary | ICD-10-CM | POA: Insufficient documentation

## 2012-05-11 DIAGNOSIS — Z8669 Personal history of other diseases of the nervous system and sense organs: Secondary | ICD-10-CM | POA: Insufficient documentation

## 2012-05-11 DIAGNOSIS — Z8679 Personal history of other diseases of the circulatory system: Secondary | ICD-10-CM | POA: Insufficient documentation

## 2012-05-11 DIAGNOSIS — M129 Arthropathy, unspecified: Secondary | ICD-10-CM | POA: Insufficient documentation

## 2012-05-11 DIAGNOSIS — F17200 Nicotine dependence, unspecified, uncomplicated: Secondary | ICD-10-CM | POA: Insufficient documentation

## 2012-05-11 DIAGNOSIS — Z79899 Other long term (current) drug therapy: Secondary | ICD-10-CM | POA: Insufficient documentation

## 2012-05-11 DIAGNOSIS — Z8701 Personal history of pneumonia (recurrent): Secondary | ICD-10-CM | POA: Insufficient documentation

## 2012-05-11 DIAGNOSIS — E785 Hyperlipidemia, unspecified: Secondary | ICD-10-CM | POA: Insufficient documentation

## 2012-05-11 DIAGNOSIS — Z862 Personal history of diseases of the blood and blood-forming organs and certain disorders involving the immune mechanism: Secondary | ICD-10-CM | POA: Insufficient documentation

## 2012-05-11 DIAGNOSIS — I509 Heart failure, unspecified: Secondary | ICD-10-CM | POA: Insufficient documentation

## 2012-05-11 DIAGNOSIS — R61 Generalized hyperhidrosis: Secondary | ICD-10-CM | POA: Insufficient documentation

## 2012-05-11 DIAGNOSIS — R209 Unspecified disturbances of skin sensation: Secondary | ICD-10-CM | POA: Insufficient documentation

## 2012-05-11 DIAGNOSIS — E669 Obesity, unspecified: Secondary | ICD-10-CM | POA: Insufficient documentation

## 2012-05-11 DIAGNOSIS — Z95810 Presence of automatic (implantable) cardiac defibrillator: Secondary | ICD-10-CM | POA: Insufficient documentation

## 2012-05-11 DIAGNOSIS — K219 Gastro-esophageal reflux disease without esophagitis: Secondary | ICD-10-CM | POA: Insufficient documentation

## 2012-05-11 DIAGNOSIS — I10 Essential (primary) hypertension: Secondary | ICD-10-CM | POA: Insufficient documentation

## 2012-05-11 DIAGNOSIS — G40909 Epilepsy, unspecified, not intractable, without status epilepticus: Secondary | ICD-10-CM | POA: Insufficient documentation

## 2012-05-11 DIAGNOSIS — Z8639 Personal history of other endocrine, nutritional and metabolic disease: Secondary | ICD-10-CM | POA: Insufficient documentation

## 2012-05-11 DIAGNOSIS — J45909 Unspecified asthma, uncomplicated: Secondary | ICD-10-CM | POA: Insufficient documentation

## 2012-05-11 DIAGNOSIS — Z8659 Personal history of other mental and behavioral disorders: Secondary | ICD-10-CM | POA: Insufficient documentation

## 2012-05-11 DIAGNOSIS — R531 Weakness: Secondary | ICD-10-CM

## 2012-05-11 LAB — CBC WITH DIFFERENTIAL/PLATELET
Basophils Absolute: 0.1 10*3/uL (ref 0.0–0.1)
Basophils Relative: 1 % (ref 0–1)
Lymphocytes Relative: 27 % (ref 12–46)
MCHC: 33.3 g/dL (ref 30.0–36.0)
Monocytes Absolute: 0.9 10*3/uL (ref 0.1–1.0)
Neutro Abs: 6.6 10*3/uL (ref 1.7–7.7)
Neutrophils Relative %: 62 % (ref 43–77)
Platelets: 257 10*3/uL (ref 150–400)
RDW: 12.3 % (ref 11.5–15.5)
WBC: 10.5 10*3/uL (ref 4.0–10.5)

## 2012-05-11 LAB — BASIC METABOLIC PANEL
CO2: 27 mEq/L (ref 19–32)
Chloride: 101 mEq/L (ref 96–112)
Creatinine, Ser: 0.78 mg/dL (ref 0.50–1.10)
GFR calc Af Amer: >90 mL/min (ref >90)
Potassium: 4.1 mEq/L (ref 3.5–5.1)
Sodium: 138 mEq/L (ref 135–145)

## 2012-05-11 LAB — GLUCOSE, CAPILLARY: Glucose-Capillary: 104 mg/dL — ABNORMAL HIGH (ref 70–99)

## 2012-05-11 NOTE — ED Notes (Signed)
Pt sitting up on stretcher, talking on phone, no slurred speech, no deficits noted at this time.

## 2012-05-11 NOTE — ED Notes (Signed)
Pt states she started feeling some weakness and "funny feeling" in her face and mouth approx 2 pm today.  Family called ems because pt was having some slurred speech and a draw to her mouth.  Pt states she is feeling better now, has equal strength and grip bilaterally.

## 2012-05-11 NOTE — ED Notes (Signed)
Pt ambulating in room without assistance, steady gait noted.

## 2012-05-11 NOTE — ED Provider Notes (Addendum)
History     CSN: [id number]  Arrival date & time 05/11/12  Barry Brunner   First MD Initiated Contact with Patient 05/11/12 1939      Chief Complaint  Patient presents with  . Weakness    (Consider location/radiation/quality/duration/timing/severity/associated sxs/prior treatment) HPI...Marland Kitchenweakness approximately 7 PM today with associated sensation of diaphoresis, body heat, tingling, mouth contortion  She is feeling better now. This happened 2 or 3 times in the past with a negative workup.  Past Medical History  Diagnosis Date  . Hypertension     09/2010-normal CMet and CBC; Lipid profile-116, 88, 25, 73  . Cardiomyopathy 08/2010    Presented with congestive heart failure; EF of 15% and 2012; hypotension on medication precludes optimal dosing  . Gastroesophageal reflux disease   . Hypothyroidism     Recent TSH was normal.  . Pneumonia   . Obesity   . Hyperlipidemia   . Tobacco abuse     20 pack years  . CHF (congestive heart failure)   . Asthma   . Shortness of breath   . Seizures   . Headache   . Arthritis   . Anxiety   . Neuropathy   . 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 Cesarean section      X2    Family History  Problem Relation Age of Onset  . Cardiomyopathy Mother     ICD pacemaker-ischmic CM  . Heart failure Mother   . Hypertension Mother   . Cardiomyopathy Father     Deceased  . Coronary artery disease Father   . Heart failure Father   . Heart failure Brother   . Hypertension Brother     History  Substance Use Topics  . Smoking status: Current Every Day Smoker -- 0.50 packs/day for 21 years    Types: Cigarettes  . Smokeless tobacco: Never Used  . Alcohol Use: No    OB History   Grav Para Term Preterm Abortions TAB SAB Ect Mult Living                  Review of Systems  All other systems reviewed and are negative.    Allergies  Fish allergy; Lentil; Penicillins;  Iodinated diagnostic agents; Iodine; Sulfa antibiotics; and Spironolactone  Home Medications   Current Outpatient Rx  Name  Route  Sig  Dispense  Refill  . acetaminophen (TYLENOL) 500 MG tablet   Oral   Take 500 mg by mouth every 6 (six) hours as needed. Pain         . albuterol (PROVENTIL HFA) 108 (90 BASE) MCG/ACT inhaler   Inhalation   Inhale 2 puffs into the lungs every 6 (six) hours as needed. For shortness of breath   1 Inhaler   6   . azithromycin (ZITHROMAX) 250 MG tablet      1 by mouth daily starting Wednesday for 4 days    #4   6 each   0   . budesonide-formoterol (SYMBICORT) 80-4.5 MCG/ACT inhaler   Inhalation   Inhale 2 puffs into the lungs 2 (two) times daily.   1 Inhaler   6   . carvedilol (COREG) 25 MG tablet   Oral   Take 2 tablets (50 mg total) by mouth 2 (two) times daily.   120 tablet   6   . desloratadine (CLARINEX) 5 MG tablet   Oral   Take 5 mg by mouth daily.         Marland Kitchen  ferrous sulfate (IRON SUPPLEMENT) 325 (65 FE) MG tablet   Oral   Take 325 mg by mouth daily with breakfast.         . furosemide (LASIX) 40 MG tablet   Oral   Take 2 tablets (80 mg total) by mouth 2 (two) times daily.   120 tablet   6   . gabapentin (NEURONTIN) 300 MG capsule   Oral   Take 300 mg by mouth 2 (two) times daily.         Marland Kitchen HYDROcodone-acetaminophen (LORTAB) 7.5-500 MG/15ML solution      15 ML by mouth every 4 hours when necessary cough   120 mL   0   . levothyroxine (SYNTHROID, LEVOTHROID) 150 MCG tablet   Oral   Take 300 mcg by mouth daily.         . potassium chloride SA (K-DUR,KLOR-CON) 20 MEQ tablet   Oral   Take 2 tablets (40 mEq total) by mouth 2 (two) times daily.   120 tablet   6   . ranitidine (ZANTAC) 150 MG tablet   Oral   Take 150 mg by mouth 2 (two) times daily as needed. Heart Burn         . rosuvastatin (CRESTOR) 40 MG tablet   Oral   Take 1 tablet (40 mg total) by mouth daily.   30 tablet   4     6 month follow  up due in May 2014; Call in April t ...     BP 110/66  Pulse 109  Temp(Src) 98 F (36.7 C) (Oral)  Resp 20  SpO2 97%  Physical Exam  Nursing note and vitals reviewed. Constitutional: She is oriented to person, place, and time. She appears well-developed and well-nourished.  HENT:  Head: Normocephalic and atraumatic.  Eyes: Conjunctivae and EOM are normal. Pupils are equal, round, and reactive to light.  Neck: Normal range of motion. Neck supple.  Cardiovascular: Normal rate, regular rhythm and normal heart sounds.   Pulmonary/Chest: Effort normal and breath sounds normal.  Abdominal: Soft. Bowel sounds are normal.  Musculoskeletal: Normal range of motion.  Neurological: She is alert and oriented to person, place, and time.  Skin: Skin is warm and dry.  Psychiatric: She has a normal mood and affect.    ED Course  Procedures (including critical care time)  Labs Reviewed  GLUCOSE, CAPILLARY - Abnormal; Notable for the following:    Glucose-Capillary 104 (*)    All other components within normal limits  BASIC METABOLIC PANEL - Abnormal; Notable for the following:    Glucose, Bld 103 (*)    All other components within normal limits  CBC WITH DIFFERENTIAL   Ct Head Wo Contrast  05/11/2012  *RADIOLOGY REPORT*  Clinical Data: 52 year old female with acute onset of weakness and abnormal sensation of the face and mouth.  Slurred speech.  CT HEAD WITHOUT CONTRAST  Technique:  Contiguous axial images were obtained from the base of the skull through the vertex without contrast.  Comparison: 11/06/2011 and earlier.  Findings: Visualized paranasal sinuses and mastoids are clear.  No acute osseous abnormality identified.  Visualized orbits and scalp soft tissues are within normal limits.  Stable and normal cerebral volume.  No ventriculomegaly. No midline shift, mass effect, or evidence of mass lesion.  There is a tiny chronic lacunar infarct in the left cerebellum re-identified. Elsewhere  gray -white matter differentiation is within normal limits throughout the brain.  No evidence of cortically based acute infarction  identified.  No suspicious intracranial vascular hyperdensity. No acute intracranial hemorrhage identified.  IMPRESSION: Stable and largely unremarkable noncontrast CT appearance of the brain; tiny chronic left cerebellar infarct stable since 2008.   Original Report Authenticated By: Erskine Speed, M.D.     Date: 05/11/2012  Rate: 109  Rhythm: sinus tachycardia  QRS Axis: normal  Intervals: normal  ST/T Wave abnormalities: normal  Conduction Disutrbances:left anterior fascicular block  Narrative Interpretation:   Old EKG Reviewed: changes noted   No diagnosis found.    MDM  Uncertain etiology of symptom complex.  Patient is now back to normal.  Normal physical exam in emergency department. This is happened to her before with a negative workup. No neuro deficits at discharge           Donnetta Hutching, MD 05/12/12 [id number]  Donnetta Hutching, MD 05/28/12 [phone]

## 2012-05-14 ENCOUNTER — Telehealth: Payer: Self-pay | Admitting: Cardiology

## 2012-05-14 NOTE — Telephone Encounter (Signed)
PT STATES THAT WE HAVE NOT CALLED HER CARVEDILOL IN TO PHARMACY. SHE IS WONDERING IF WE HAVE TOOK HER OFF THIS MEDICATION

## 2012-05-15 ENCOUNTER — Other Ambulatory Visit: Payer: Self-pay | Admitting: *Deleted

## 2012-05-15 NOTE — Telephone Encounter (Signed)
Clarified with [facility] that coreg, is in fact being filled today.  Patient notified.

## 2012-05-16 ENCOUNTER — Other Ambulatory Visit: Payer: Self-pay | Admitting: Cardiology

## 2012-05-16 MED ORDER — ROSUVASTATIN CALCIUM 40 MG PO TABS: 40.0000 mg | ORAL_TABLET | Freq: Every day | ORAL | Status: DC

## 2012-06-05 ENCOUNTER — Other Ambulatory Visit (HOSPITAL_COMMUNITY)
Admission: RE | Admit: 2012-06-05 | Discharge: 2012-06-05 | Disposition: A | Discharge status: Home / Self Care | Payer: Medicaid Other | Source: Ambulatory Visit | Attending: Unknown Physician Specialty | Admitting: Unknown Physician Specialty

## 2012-06-05 DIAGNOSIS — R87612 Low grade squamous intraepithelial lesion on cytologic smear of cervix (LGSIL): Secondary | ICD-10-CM | POA: Insufficient documentation

## 2012-06-07 ENCOUNTER — Telehealth: Payer: Self-pay | Admitting: *Deleted

## 2012-06-07 DIAGNOSIS — E782 Mixed hyperlipidemia: Secondary | ICD-10-CM

## 2012-06-07 NOTE — Telephone Encounter (Signed)
May we try lovastatin for Melanie Cordova, as she now has insurance and cannot afford the copay.

## 2012-06-08 ENCOUNTER — Encounter: Payer: Self-pay | Admitting: *Deleted

## 2012-06-08 MED ORDER — LOVASTATIN 40 MG PO TABS: 40.0000 mg | ORAL_TABLET | Freq: Every day | ORAL | Status: DC

## 2012-06-08 NOTE — Telephone Encounter (Signed)
It is the Crestor copay that she cant afford.  Advised patient of recommendations.  Verbalized understanding.

## 2012-06-08 NOTE — Telephone Encounter (Signed)
I assume that she cannot afford the co-pay for rosuvastatin. She can be started on lovastatin 40 mg per day with a lipid profile in one month.

## 2012-06-08 NOTE — Addendum Note (Signed)
Addended by: Reather Laurence A on: 06/08/2012 01:25 PM   Modules accepted: Orders, Medications

## 2012-06-11 ENCOUNTER — Ambulatory Visit (INDEPENDENT_AMBULATORY_CARE_PROVIDER_SITE_OTHER): Payer: Medicaid Other | Admitting: *Deleted

## 2012-06-11 ENCOUNTER — Other Ambulatory Visit: Payer: Self-pay | Admitting: Internal Medicine

## 2012-06-11 ENCOUNTER — Encounter: Payer: Self-pay | Admitting: *Deleted

## 2012-06-11 DIAGNOSIS — I5022 Chronic systolic (congestive) heart failure: Secondary | ICD-10-CM

## 2012-06-11 DIAGNOSIS — I429 Cardiomyopathy, unspecified: Secondary | ICD-10-CM

## 2012-06-11 DIAGNOSIS — I428 Other cardiomyopathies: Secondary | ICD-10-CM

## 2012-06-11 LAB — ICD DEVICE OBSERVATION
BRDY-0002RV: 40 {beats}/min
DEV-0020ICD: NEGATIVE
DEVICE MODEL ICD: 1022442
TOT-0007: 1
TOT-0008: 0
TOT-0010: 1
VENTRICULAR PACING ICD: 0.01 pct
VF: 0

## 2012-06-11 NOTE — Progress Notes (Signed)
ICD check with [employer] 

## 2012-07-03 ENCOUNTER — Encounter: Payer: Self-pay | Admitting: Internal Medicine

## 2012-07-08 ENCOUNTER — Inpatient Hospital Stay (HOSPITAL_COMMUNITY)
Admission: EM | Admit: 2012-07-08 | Discharge: 2012-07-12 | DRG: 069 | Disposition: A | Discharge status: Home / Self Care | Payer: Medicaid Other | Attending: Internal Medicine | Admitting: Internal Medicine

## 2012-07-08 ENCOUNTER — Encounter (HOSPITAL_COMMUNITY): Payer: Self-pay | Admitting: Emergency Medicine

## 2012-07-08 ENCOUNTER — Emergency Department (HOSPITAL_COMMUNITY): Payer: Medicaid Other

## 2012-07-08 DIAGNOSIS — R471 Dysarthria and anarthria: Secondary | ICD-10-CM | POA: Diagnosis present

## 2012-07-08 DIAGNOSIS — Z79899 Other long term (current) drug therapy: Secondary | ICD-10-CM

## 2012-07-08 DIAGNOSIS — G459 Transient cerebral ischemic attack, unspecified: Principal | ICD-10-CM

## 2012-07-08 DIAGNOSIS — J9610 Chronic respiratory failure, unspecified whether with hypoxia or hypercapnia: Secondary | ICD-10-CM | POA: Diagnosis present

## 2012-07-08 DIAGNOSIS — E039 Hypothyroidism, unspecified: Secondary | ICD-10-CM

## 2012-07-08 DIAGNOSIS — F411 Generalized anxiety disorder: Secondary | ICD-10-CM | POA: Diagnosis present

## 2012-07-08 DIAGNOSIS — I5022 Chronic systolic (congestive) heart failure: Secondary | ICD-10-CM

## 2012-07-08 DIAGNOSIS — I429 Cardiomyopathy, unspecified: Secondary | ICD-10-CM

## 2012-07-08 DIAGNOSIS — F17200 Nicotine dependence, unspecified, uncomplicated: Secondary | ICD-10-CM

## 2012-07-08 DIAGNOSIS — I639 Cerebral infarction, unspecified: Secondary | ICD-10-CM

## 2012-07-08 DIAGNOSIS — R29898 Other symptoms and signs involving the musculoskeletal system: Secondary | ICD-10-CM

## 2012-07-08 DIAGNOSIS — R4789 Other speech disturbances: Secondary | ICD-10-CM | POA: Diagnosis present

## 2012-07-08 DIAGNOSIS — I10 Essential (primary) hypertension: Secondary | ICD-10-CM

## 2012-07-08 DIAGNOSIS — E785 Hyperlipidemia, unspecified: Secondary | ICD-10-CM

## 2012-07-08 DIAGNOSIS — IMO0002 Reserved for concepts with insufficient information to code with codable children: Secondary | ICD-10-CM | POA: Diagnosis present

## 2012-07-08 DIAGNOSIS — Z95810 Presence of automatic (implantable) cardiac defibrillator: Secondary | ICD-10-CM

## 2012-07-08 DIAGNOSIS — J4489 Other specified chronic obstructive pulmonary disease: Secondary | ICD-10-CM | POA: Diagnosis present

## 2012-07-08 DIAGNOSIS — R569 Unspecified convulsions: Secondary | ICD-10-CM | POA: Diagnosis present

## 2012-07-08 DIAGNOSIS — I428 Other cardiomyopathies: Secondary | ICD-10-CM

## 2012-07-08 DIAGNOSIS — M129 Arthropathy, unspecified: Secondary | ICD-10-CM | POA: Diagnosis present

## 2012-07-08 DIAGNOSIS — J449 Chronic obstructive pulmonary disease, unspecified: Secondary | ICD-10-CM | POA: Diagnosis present

## 2012-07-08 DIAGNOSIS — K219 Gastro-esophageal reflux disease without esophagitis: Secondary | ICD-10-CM

## 2012-07-08 DIAGNOSIS — E669 Obesity, unspecified: Secondary | ICD-10-CM

## 2012-07-08 DIAGNOSIS — T7411XA Adult physical abuse, confirmed, initial encounter: Secondary | ICD-10-CM | POA: Diagnosis present

## 2012-07-08 DIAGNOSIS — I509 Heart failure, unspecified: Secondary | ICD-10-CM | POA: Diagnosis present

## 2012-07-08 DIAGNOSIS — Z720 Tobacco use: Secondary | ICD-10-CM

## 2012-07-08 DIAGNOSIS — G8190 Hemiplegia, unspecified affecting unspecified side: Secondary | ICD-10-CM | POA: Diagnosis present

## 2012-07-08 DIAGNOSIS — Z6841 Body Mass Index (BMI) 40.0 and over, adult: Secondary | ICD-10-CM

## 2012-07-08 LAB — POCT I-STAT, CHEM 8
Hemoglobin: 14.3 g/dL (ref 12.0–15.0)
Potassium: 4.6 mEq/L (ref 3.5–5.1)
Sodium: 141 mEq/L (ref 135–145)
TCO2: 25 mmol/L (ref 0–100)

## 2012-07-08 LAB — POCT I-STAT TROPONIN I: Troponin i, poc: 0.04 ng/mL (ref 0.00–0.08)

## 2012-07-08 MED ORDER — ASPIRIN 325 MG PO TABS
325.0000 mg | ORAL_TABLET | Freq: Every day | ORAL | Status: DC
  Administered 2012-07-08 – 2012-07-12 (×4): 325 mg via ORAL
  Filled 2012-07-08 (×7): qty 1

## 2012-07-08 MED ORDER — FERROUS SULFATE 325 (65 FE) MG PO TABS
325.0000 mg | ORAL_TABLET | Freq: Every day | ORAL | Status: DC
  Administered 2012-07-09 – 2012-07-12 (×3): 325 mg via ORAL
  Filled 2012-07-08 (×6): qty 1

## 2012-07-08 MED ORDER — SODIUM CHLORIDE 0.9 % IJ SOLN
3.0000 mL | Freq: Two times a day (BID) | INTRAMUSCULAR | Status: DC
  Administered 2012-07-08 – 2012-07-11 (×6): 3 mL via INTRAVENOUS

## 2012-07-08 MED ORDER — SIMVASTATIN 20 MG PO TABS
20.0000 mg | ORAL_TABLET | Freq: Every day | ORAL | Status: DC
  Administered 2012-07-10 – 2012-07-11 (×2): 20 mg via ORAL
  Filled 2012-07-08 (×4): qty 1

## 2012-07-08 MED ORDER — ALBUTEROL SULFATE HFA 108 (90 BASE) MCG/ACT IN AERS: 2.0000 | INHALATION_SPRAY | Freq: Four times a day (QID) | RESPIRATORY_TRACT | Status: DC | PRN

## 2012-07-08 MED ORDER — SODIUM CHLORIDE 0.9 % IJ SOLN
3.0000 mL | INTRAMUSCULAR | Status: DC | PRN
  Administered 2012-07-09 – 2012-07-12 (×2): 3 mL via INTRAVENOUS

## 2012-07-08 MED ORDER — LEVOTHYROXINE SODIUM 150 MCG PO TABS
300.0000 ug | ORAL_TABLET | Freq: Every day | ORAL | Status: DC
Start: 2012-07-09 — End: 2012-07-09
  Administered 2012-07-09: 300 ug via ORAL
  Filled 2012-07-08 (×2): qty 2

## 2012-07-08 MED ORDER — HEPARIN SODIUM (PORCINE) 5000 UNIT/ML IJ SOLN
5000.0000 [IU] | Freq: Three times a day (TID) | INTRAMUSCULAR | Status: DC
  Administered 2012-07-08 – 2012-07-12 (×11): 5000 [IU] via SUBCUTANEOUS
  Filled 2012-07-08 (×14): qty 1

## 2012-07-08 MED ORDER — ACETAMINOPHEN 500 MG PO TABS
500.0000 mg | ORAL_TABLET | Freq: Four times a day (QID) | ORAL | Status: DC | PRN
  Filled 2012-07-08: qty 1

## 2012-07-08 MED ORDER — GABAPENTIN 300 MG PO CAPS
300.0000 mg | ORAL_CAPSULE | Freq: Every day | ORAL | Status: DC
  Administered 2012-07-08 – 2012-07-11 (×4): 300 mg via ORAL
  Filled 2012-07-08 (×5): qty 1

## 2012-07-08 MED ORDER — BUDESONIDE-FORMOTEROL FUMARATE 80-4.5 MCG/ACT IN AERO
2.0000 | INHALATION_SPRAY | Freq: Two times a day (BID) | RESPIRATORY_TRACT | Status: DC
  Administered 2012-07-08 – 2012-07-12 (×7): 2 via RESPIRATORY_TRACT
  Filled 2012-07-08: qty 6.9

## 2012-07-08 MED ORDER — LORATADINE 10 MG PO TABS
10.0000 mg | ORAL_TABLET | Freq: Every day | ORAL | Status: DC
  Administered 2012-07-09 – 2012-07-12 (×3): 10 mg via ORAL
  Filled 2012-07-08 (×4): qty 1

## 2012-07-08 NOTE — H&P (Signed)
Hospital Admission Note Date: 07/08/2012  Patient name: Melanie Cordova Medical record number: [id number] Date of birth: 02-18-1961 Age: 52 y.o. Gender: female PCP: [name], NP  Medical Service: [facility]  Attending physician:     1st Contact:  Dr. Virgina Organ  Pager: [phone] 2nd Contact:  Dr. Dorise Hiss  Pager: [phone] After 5 pm or weekends: 1st Contact:      Pager: [phone] 2nd Contact:      Pager: [phone]  Chief Complaint: slurred speech, left sided weakness  History of Present Illness: Melanie Cordova is a 52 year old morbidly obese white female with PMH of HTN, Hypothyroidism, CHF EF 25-30% PER 10/2011 echo, s/p ICD, and COPD presents to the ED today with new onset left sided weakness with slurred speech. She claims her symptoms started suddenly this afternoon around 1215pm and was witnessed by her boyfriend. It was accompanied by left hemiparesis and she states it felt like her "joints locked up". She was unable to walk and needed ambulance and had impaired speech. She states that directly prior to this event she was having a headache which is unusual for her. She did not have any vision changes during this episode or in the past. She states the symptoms were fairly resolved in the ED around 1430.   She denied any chest pain, N/V/D, fever, chills, abdominal pain, or any urinary complaints. She states she has had some allergies the last 2 days or so however this is not unusual for her. She has not had any changes in her medications and she has not missed any doses of her medications recently.   By the time we were evaluating her in the ED her weakness was slightly present on the left side, speech was still not at baseline per patient. There were no family or friends present in the ED to corroborate her speech compared to baseline.  Of note, her ICD was checked while during our excam and there was no indication of a shock given or malfunction with the device.   Meds: Current  Outpatient Rx  Name  Route  Sig  Dispense  Refill  . acetaminophen (TYLENOL) 500 MG tablet   Oral   Take 500 mg by mouth every 6 (six) hours as needed for pain.          Marland Kitchen albuterol (PROVENTIL HFA;VENTOLIN HFA) 108 (90 BASE) MCG/ACT inhaler   Inhalation   Inhale 2 puffs into the lungs every 6 (six) hours as needed for wheezing or shortness of breath.         . budesonide-formoterol (SYMBICORT) 80-4.5 MCG/ACT inhaler   Inhalation   Inhale 2 puffs into the lungs 2 (two) times daily.   1 Inhaler   6   . carvedilol (COREG) 25 MG tablet   Oral   Take 2 tablets (50 mg total) by mouth 2 (two) times daily.   120 tablet   6   . desloratadine (CLARINEX) 5 MG tablet   Oral   Take 5 mg by mouth daily.         . diclofenac sodium (VOLTAREN) 1 % GEL   Topical   Apply 2 g topically 4 (four) times daily.         . ferrous sulfate (IRON SUPPLEMENT) 325 (65 FE) MG tablet   Oral   Take 325 mg by mouth daily with breakfast.         . fish oil-omega-3 fatty acids 1000 MG capsule   Oral  Take 1 g by mouth daily.         . furosemide (LASIX) 40 MG tablet   Oral   Take 2 tablets (80 mg total) by mouth 2 (two) times daily.   120 tablet   6   . gabapentin (NEURONTIN) 300 MG capsule   Oral   Take 300 mg by mouth at bedtime.          Marland Kitchen levothyroxine (SYNTHROID, LEVOTHROID) 150 MCG tablet   Oral   Take 300 mcg by mouth daily.         Marland Kitchen lovastatin (MEVACOR) 40 MG tablet   Oral   Take 1 tablet (40 mg total) by mouth at bedtime.   30 tablet   6   . potassium chloride SA (K-DUR,KLOR-CON) 20 MEQ tablet   Oral   Take 2 tablets (40 mEq total) by mouth 2 (two) times daily.   120 tablet   6   . ranitidine (ZANTAC) 150 MG tablet   Oral   Take 150 mg by mouth 2 (two) times daily as needed. Heart Burn          Allergies: Allergies as of 07/08/2012 - Review Complete 07/08/2012  Allergen Reaction Noted  . Fish allergy Anaphylaxis 03/20/2012  . Lentil Anaphylaxis  03/20/2012  . Penicillins Anaphylaxis and Shortness Of Breath 05/04/2010  . Iodinated diagnostic agents Hives and Other (See Comments) 09/24/2010  . Iodine Hives 12/23/2010  . Sulfa antibiotics Other (See Comments) 05/04/2010  . Spironolactone Rash 07/11/2011   Past Medical History  Diagnosis Date  . Hypertension     09/2010-normal CMet and CBC; Lipid profile-116, 88, 25, 73  . Cardiomyopathy 08/2010    Presented with congestive heart failure; EF of 15% and 2012; hypotension on medication precludes optimal dosing  . Gastroesophageal reflux disease   . Hypothyroidism     Recent TSH was normal.  . Pneumonia   . Obesity   . Hyperlipidemia   . Tobacco abuse     20 pack years  . CHF (congestive heart failure)   . Asthma   . Shortness of breath   . Seizures   . Headache   . Arthritis   . Anxiety   . Neuropathy   . 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 Chronic systolic heart failure    Past Surgical History  Procedure Laterality Date  . Cesarean section      X2   Family History  Problem Relation Age of Onset  . Cardiomyopathy Mother     ICD pacemaker-ischmic CM  . Heart failure Mother   . Hypertension Mother   . Cardiomyopathy Father     Deceased  . Coronary artery disease Father   . Heart failure Father   . Heart failure Brother   . Hypertension Brother    History   Social History  . Marital Status: Divorced    Spouse Name: N/A    Number of Children: 2  . Years of Education: N/A   Occupational History  .      Works in Science writer   Social History Main Topics  . Smoking status: Current Every Day Smoker -- 0.50 packs/day for 21 years    Types: Cigarettes  . Smokeless tobacco: Never Used  . Alcohol Use: No  . Drug Use: No  . Sexually Active: No   Other Topics Concern  . Not on file   Social History Narrative  . No narrative on file   Review of  Systems: Pertinent items are noted in HPI.  Physical Exam: Blood pressure  126/81, pulse 77, temperature 97.9 F (36.6 C), temperature source Oral, resp. rate 15, SpO2 99.00%. General: resting in bed HEENT: PERRL, EOMI, no scleral icterus Cardiac: S1 S2 heard, intermittent irregular beat, distant heart sounds Pulm: distant lung sounds due to body habitus, clear to auscultation bilaterally, moving normal volumes of air Abd: soft, nontender, nondistended, BS present, obese Ext: warm and well perfused, no pedal edema, +2 dp b/l, non tender to palpation.  Moving all four extremities.   Neuro: alert and oriented X3, cranial nerves II-XII grossly intact, Lower LE 4/5, right LE 5/5 strength, some slowing of fine motor skills in the left and decreased ROM of the left LE, especially noted with heel to shin of LLE.     Lab results: Basic Metabolic Panel:  Recent Labs  16/10/96 1341  NA 141  K 4.6  CL 108  GLUCOSE 89  BUN 12  CREATININE 0.70   CBC:  Recent Labs  07/08/12 1341  HGB 14.3  HCT 42.0   Urine Drug Screen: Drugs of Abuse     Component Value Date/Time   LABOPIA NONE DETECTED 11/06/2011 2040   COCAINSCRNUR NONE DETECTED 11/06/2011 2040   LABBENZ NONE DETECTED 11/06/2011 2040   AMPHETMU NONE DETECTED 11/06/2011 2040   THCU NONE DETECTED 11/06/2011 2040   LABBARB NONE DETECTED 11/06/2011 2040    Imaging results:  Ct Head Wo Contrast  07/08/2012  *RADIOLOGY REPORT*  Clinical Data: 52 year old female with left-sided weakness and slurred speech.  CT HEAD WITHOUT CONTRAST  Technique:  Contiguous axial images were obtained from the base of the skull through the vertex without contrast.  Comparison: 05/11/2012 and prior CTs.  Findings: No intracranial abnormalities are identified, including mass lesion or mass effect, hydrocephalus, extra-axial fluid collection, midline shift, hemorrhage, or acute infarction.  The visualized bony calvarium is unremarkable.  IMPRESSION: Unremarkable noncontrast head CT.  Critical Value/emergent results were called by telephone at  the time of interpretation on 07/08/2012 at 1:53 p.m. to Dr. Judd Lien, who verbally acknowledged these results.   Original Report Authenticated By: Harmon Pier, M.D.    Dg Chest Portable 1 View  07/08/2012  *RADIOLOGY REPORT*  Clinical Data: Code stroke  PORTABLE CHEST - 1 VIEW  Comparison: 03/20/2012  Findings: Cardiac enlargement without heart failure.  Single lead pacemaker unchanged.  Negative for heart failure.  Negative for mass or pneumonia.  IMPRESSION: No acute cardiopulmonary abnormality.   Original Report Authenticated By: Janeece Riggers, M.D.    Other results: EKG: unchanged from previous tracings, normal sinus rhythm, frequent PVC's noted, ST depression in 2, 3, avf. HR 80bpm  Assessment & Plan by Problem: Ms. Arif is a 52 year old morbidly obese female with extensive PMH significant for CHF, ICD placed, COPD, and HTN admitted for CVA vs. TIA.    Sudden onset left sided weakness--?TIA (transient ischemic attack) vs. CVA.  Presented with sudden onset of left sided weakness relative resolved symptoms by time of admission.  Given resolution of symptoms and CT head on admission unremarkable, likely TIA.  Unable to obtain MRI given presence of ICD.  Multiple risk factors including: HTN, smoking, and hyperlipidemia.  Neurology was called on code stroke, NIHSS 6, tPA not given due to resolution of symptoms.  Noted to have right sided weakness and slurred speech last month that also spontaneously resolved at that time.   -Admit to tele -appreciate neurology following -frequent neuro checks--monitor closely still within  window of thrombolytics -Risk Stratify: TSH, HbA1c, lipid panel, UDS -ASA -2 D ECHO -Carotid dopplers -PT/OT/swallow eval -NPO and if passes swallow then heart healthy diet -Repeat CT head in the morning -AM labs: CBC, CMET -UDS  Obesity Class III--Last BMI 44.7 with weight 290lb in November 2013.   -obtain weight upon arrival to floor -diet and exercise counseling  needed  Hypertension--BP on admission 126/81.  On home regimen: Coreg 50mg  BID and Lasix 80mg  BID.  Will allow for permissive hypertension in setting of acute CVA.   Hold anti-hypertensives in case of stroke.  -monitor vitals -if repeat imaging, consider restarting home medications  Hypothyroidism--TSH 10/2011: 1.397.  On Synthroid qd at home.   -continue home dose synthroid -f/u TSH  Tobacco abuse--continues to smoke 4 cigarettes a day.  Used to smoke 1 pack per day and started at age of 71. -smoking cessation strongly advised -consider nicotine patch if needed  Hyperlipidemia--morbidly obese.  Lipid panel 02/2012: Chol 203, HDL 29, LDL 117, TG 284.  She is on Lovastatin 40mg  at home and fish oil 1g qd (although noted to have fish allergy in chart) -continue statin--Zocor 20mg  qd -recommend diet and exercise control   Chronic systolic heart failure--ICD in placed.  Per Echo 10/2011:  EF 25-30%, LV moderately dilated, systolic function globally and moderate to severely reduced, mildly dyssynchronous septal motion, mildly calcified aortic valve, left and right atrium moderately dilated. on Lasix 80mg  BID at home.  Does not appear to be in acute exacerbation at this time, baseline SOB, no desat noted on room air but easily DOE with movement.  CXR on admission with no acute cardiopulmonary abnormality.  ICD checked in ED and did not fire or malfunction noted. proBNP 1464 04/21/11.  Troponin i poc 0.04 on admission.   -hold lasix in setting of permissive HTN, if negative imaging consider restarting -continue statin  COPD--continue to smoke cigarettes, morbidly obese.  No PFTs on chart review.  On Albuterol inhaler and Symbicort inhaler and Clarinex at home. Baseline SOB per patient.  No o2 desat on room air noted on admission.  -Albuterol inhalers prn q 6 hours -Symbicort 2 puffs BID -Clarinex daily -consider PFTs as outpatient -smoking cessation strongly advised -monitor pulseox  Diet:  NPO pending swallow screen DVT Ppx: Heparin Dispo: Disposition is deferred at this time, awaiting improvement of current medical problems. Anticipated discharge in approximately 1-2 day(s).   The patient does have a current PCP (ALLEN, DEBRA, NP), therefore is not requiring [facility]PC follow-up after discharge.   The patient does not have transportation limitations that hinder transportation to clinic appointments.  SignedGenella Mech 07/08/2012, 4:54 PM

## 2012-07-08 NOTE — ED Provider Notes (Addendum)
History     CSN: [id number]  Arrival date & time 07/08/12  1319   First MD Initiated Contact with Patient 07/08/12 1333      No chief complaint on file.   (Consider location/radiation/quality/duration/timing/severity/associated sxs/prior treatment) HPI Comments: Patient started with numbness in the left face, slurred speech, and left arm numbness approximately one hour prior to arrival.  She was sitting at home when the symptoms began.  She denies any headache, injury, or trauma.  There is no left leg involvement.  Patient with pmh of HTN, Cardiomyopathy, CHF, Obesity.  Patient is a 52 y.o. female presenting with weakness. The history is provided by the patient.  Weakness This is a new problem. The current episode started less than 1 hour ago. The problem occurs constantly. The problem has not changed since onset.Nothing aggravates the symptoms. Nothing relieves the symptoms. She has tried nothing for the symptoms. The treatment provided no relief.    Past Medical History  Diagnosis Date  . Hypertension     09/2010-normal CMet and CBC; Lipid profile-116, 88, 25, 73  . Cardiomyopathy 08/2010    Presented with congestive heart failure; EF of 15% and 2012; hypotension on medication precludes optimal dosing  . Gastroesophageal reflux disease   . Hypothyroidism     Recent TSH was normal.  . Pneumonia   . Obesity   . Hyperlipidemia   . Tobacco abuse     20 pack years  . CHF (congestive heart failure)   . Asthma   . Shortness of breath   . Seizures   . Headache   . Arthritis   . Anxiety   . Neuropathy   . 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 Chronic systolic heart failure     Past Surgical History  Procedure Laterality Date  . Cesarean section      X2    Family History  Problem Relation Age of Onset  . Cardiomyopathy Mother     ICD pacemaker-ischmic CM  . Heart failure Mother   . Hypertension Mother   . Cardiomyopathy Father     Deceased  .  Coronary artery disease Father   . Heart failure Father   . Heart failure Brother   . Hypertension Brother     History  Substance Use Topics  . Smoking status: Current Every Day Smoker -- 0.50 packs/day for 21 years    Types: Cigarettes  . Smokeless tobacco: Never Used  . Alcohol Use: No    OB History   Grav Para Term Preterm Abortions TAB SAB Ect Mult Living                  Review of Systems  Neurological: Positive for weakness.  All other systems reviewed and are negative.    Allergies  Fish allergy; Lentil; Penicillins; Iodinated diagnostic agents; Iodine; Sulfa antibiotics; and Spironolactone  Home Medications   Current Outpatient Rx  Name  Route  Sig  Dispense  Refill  . acetaminophen (TYLENOL) 500 MG tablet   Oral   Take 500 mg by mouth every 6 (six) hours as needed. Pain         . albuterol (PROVENTIL HFA) 108 (90 BASE) MCG/ACT inhaler   Inhalation   Inhale 2 puffs into the lungs every 6 (six) hours as needed. For shortness of breath   1 Inhaler   6   . budesonide-formoterol (SYMBICORT) 80-4.5 MCG/ACT inhaler   Inhalation   Inhale 2 puffs into the lungs 2 (  two) times daily.   1 Inhaler   6   . carvedilol (COREG) 25 MG tablet   Oral   Take 2 tablets (50 mg total) by mouth 2 (two) times daily.   120 tablet   6   . desloratadine (CLARINEX) 5 MG tablet   Oral   Take 5 mg by mouth daily.         . ferrous sulfate (IRON SUPPLEMENT) 325 (65 FE) MG tablet   Oral   Take 325 mg by mouth daily with breakfast.         . furosemide (LASIX) 40 MG tablet   Oral   Take 2 tablets (80 mg total) by mouth 2 (two) times daily.   120 tablet   6   . gabapentin (NEURONTIN) 300 MG capsule   Oral   Take 300 mg by mouth 2 (two) times daily.         Marland Kitchen levothyroxine (SYNTHROID, LEVOTHROID) 150 MCG tablet   Oral   Take 300 mcg by mouth daily.         Marland Kitchen lovastatin (MEVACOR) 40 MG tablet   Oral   Take 1 tablet (40 mg total) by mouth at bedtime.   30  tablet   6   . potassium chloride SA (K-DUR,KLOR-CON) 20 MEQ tablet   Oral   Take 2 tablets (40 mEq total) by mouth 2 (two) times daily.   120 tablet   6   . ranitidine (ZANTAC) 150 MG tablet   Oral   Take 150 mg by mouth 2 (two) times daily as needed. Heart Burn           BP 112/61  Pulse 76  Temp(Src) 97.9 F (36.6 C) (Oral)  SpO2 99%  Physical Exam  Nursing note and vitals reviewed. Constitutional: She is oriented to person, place, and time. She appears well-developed and well-nourished. No distress.  HENT:  Head: Normocephalic and atraumatic.  Mouth/Throat: Oropharynx is clear and moist.  Eyes: EOM are normal. Pupils are equal, round, and reactive to light.  Neck: Normal range of motion. Neck supple.  Cardiovascular: Normal rate and regular rhythm.  Exam reveals no gallop and no friction rub.   No murmur heard. Pulmonary/Chest: Effort normal and breath sounds normal. No respiratory distress. She has no wheezes.  Abdominal: Soft. Bowel sounds are normal. She exhibits no distension. There is no tenderness.  Musculoskeletal: Normal range of motion.  Neurological: She is alert and oriented to person, place, and time.  The patient's speech is slurred.  There is a mild left sided facial droop present.  Hand grip is decreased at 4/5 in the left upper extremity.  Otherwise neuro exam within normal limits.  Skin: Skin is warm and dry. She is not diaphoretic.    ED Course  Procedures (including critical care time)  Labs Reviewed  POCT I-STAT, CHEM 8  POCT I-STAT TROPONIN I   No results found.   No diagnosis found.   Date: 07/08/2012  Rate: 80  Rhythm: normal sinus rhythm with pvc's  QRS Axis: normal  Intervals: normal  ST/T Wave abnormalities: nonspecific T wave changes  Conduction Disutrbances:none  Narrative Interpretation:   Old EKG Reviewed: unchanged    MDM  Patient seen by myself and neurology after arriving as a code stroke.  She had the deficit of  left facial droop, slurred speech, and left hand grip weakness.  CT was negative and symptoms have nearly resolved, so thrombolytics were not given.  Neurology recommends  admission to the medicine service.  Teaching service consulted, agree to see and admit patient.   CRITICAL CARE Performed by: Geoffery Lyons   Total critical care time: 30 minutes  Critical care time was exclusive of separately billable procedures and treating other patients.  Critical care was necessary to treat or prevent imminent or life-threatening deterioration.  Critical care was time spent personally by me on the following activities: development of treatment plan with patient and/or surrogate as well as nursing, discussions with consultants, evaluation of patient's response to treatment, examination of patient, obtaining history from patient or surrogate, ordering and performing treatments and interventions, ordering and review of laboratory studies, ordering and review of radiographic studies, pulse oximetry and re-evaluation of patient's condition.      Geoffery Lyons, MD 07/08/12 1610  Geoffery Lyons, MD 07/08/12 [phone]

## 2012-07-08 NOTE — Code Documentation (Signed)
Code stroke called at 1303, patient arrived to [facility] ED via EMS at 1320, EDP seen at 1320, stroke team at 1325, patient in CT scan at 1324, lab at 1321.  LSN at 1235, was sitting with boyfriend and developed  Slurred speech and facial droop and left side weakness.  NIHSS 6 initally, symptoms are resolving while in ED.  Patient to be monitored

## 2012-07-08 NOTE — Consult Note (Signed)
Referring Physician: ED    Chief Complaint: code stroke: left hemiparesis, dysarthria, left face weakness.  HPI:                                                                                                                                         Melanie Cordova is an 52 y.o. female, right handed, with a past medical history significant for hypertension, hyperlipidemia, cardiomyopathy with EF 15%, s/p ICD placement, smoker, obesity, anxiety, probable TIA's, brought to the ED by medics as a code stroke due to acute onset dysarthria, left face weakness, and left hemiparesis. She was known well at 12:38 pm when she was at home taking to his boyfriend when suddenly developed slurred speech, left face droopiness, and left arm and leg weakness. No associated headache, vertigo, double vision, difficulty swallowing, confusion, or seizure-like activity. EMS called. Upon arrival to ED she had a NIHSS 6 and emergent CT brain showed no acute intracranial abnormality. We reevaluated the patient after coming back from CT and her symptoms had virtually resolved. Importantly, she had prior ED evaluations, the last one 04/2012, due to right sided weakness with slurred speech that rapidly resolved and unremarkable work up.   Date last known well: 07/08/12 Time last known well: 12:38 pm tPA Given: no, symptoms resolved.  Past Medical History  Diagnosis Date  . Hypertension     09/2010-normal CMet and CBC; Lipid profile-116, 88, 25, 73  . Cardiomyopathy 08/2010    Presented with congestive heart failure; EF of 15% and 2012; hypotension on medication precludes optimal dosing  . Gastroesophageal reflux disease   . Hypothyroidism     Recent TSH was normal.  . Pneumonia   . Obesity   . Hyperlipidemia   . Tobacco abuse     20 pack years  . CHF (congestive heart failure)   . Asthma   . Shortness of breath   . Seizures   . Headache   . Arthritis   . Anxiety   . Neuropathy   . 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 Chronic systolic heart failure     Past Surgical History  Procedure Laterality Date  . Cesarean section      X2    Family History  Problem Relation Age of Onset  . Cardiomyopathy Mother     ICD pacemaker-ischmic CM  . Heart failure Mother   . Hypertension Mother   . Cardiomyopathy Father     Deceased  . Coronary artery disease Father   . Heart failure Father   . Heart failure Brother   . Hypertension Brother    Social History:  reports that she has been smoking Cigarettes.  She has a 10.5 pack-year smoking history. She has never used smokeless tobacco. She reports that she does not drink alcohol or use illicit drugs.  Allergies:  Allergies  Allergen Reactions  . Fish  Allergy Anaphylaxis  . Lentil Anaphylaxis  . Penicillins Anaphylaxis and Shortness Of Breath    Hair loss  . Iodinated Diagnostic Agents Hives and Other (See Comments)    Pulmonary problems; no frank respiratory arrest  . Iodine Hives    Pulmonary Problems   . Sulfa Antibiotics Other (See Comments)    Unknown  . Spironolactone Rash    Medications:                                                                                                                           I have reviewed the patient's current medications.  ROS:                                                                                                                                       History obtained from the patient and chart review.  General ROS: negative for - chills, fatigue, fever, night sweats, weight gain or weight loss Psychological ROS: negative for - behavioral disorder, hallucinations, memory difficulties, mood swings or suicidal ideation Ophthalmic ROS: negative for - blurry vision, double vision, eye pain or loss of vision ENT ROS: negative for - epistaxis, nasal discharge, oral lesions, sore throat, tinnitus or vertigo Allergy and Immunology ROS: negative for - hives or  itchy/watery eyes Hematological and Lymphatic ROS: negative for - bleeding problems, bruising or swollen lymph nodes Endocrine ROS: negative for - galactorrhea, hair pattern changes, polydipsia/polyuria or temperature intolerance Respiratory ROS: negative for - cough, hemoptysis, shortness of breath or wheezing Cardiovascular ROS: negative for - chest pain, dyspnea on exertion, edema or irregular heartbeat Gastrointestinal ROS: negative for - abdominal pain, diarrhea, hematemesis, nausea/vomiting or stool incontinence Genito-Urinary ROS: negative for - dysuria, hematuria, incontinence or urinary frequency/urgency Musculoskeletal ROS: negative for - joint swelling or muscular weakness Neurological ROS: as noted in HPI Dermatological ROS: negative for rash and skin lesion changes    Physical exam: pleasant female in no apparent distress. Blood pressure 112/61, pulse 76, temperature 97.9 F (36.6 C), temperature source Oral, SpO2 99.00%. Head: normocephalic. Neck: supple, no bruits, no JVD. Cardiac: no murmurs. Lungs: clear. Abdomen: soft, no tender, no mass. Extremities: no edema.  Neurologic Examination:  Mental Status: Alert, awake, oriented x 4, thought content appropriate. Comprehension, naming, and repetition are appropriate. Speech fluent without evidence of aphasia.  Able to follow 3 step commands without difficulty. Cranial Nerves: II: Discs flat bilaterally; Visual fields grossly normal, pupils equal, round, reactive to light and accommodation III,IV, VI: ptosis not present, extra-ocular motions intact bilaterally V,VII: smile symmetric, facial light touch sensation normal bilaterally VIII: hearing normal bilaterally IX,X: gag reflex present XI: bilateral shoulder shrug XII: midline tongue extension Motor: Right : Upper extremity   5/5    Left:     Upper extremity   5/5  Lower  extremity   5/5     Lower extremity   5/5 Tone and bulk:normal tone throughout; no atrophy noted Sensory: Pinprick and light touch intact throughout, bilaterally Deep Tendon Reflexes: 2+ and symmetric throughout Plantars: Right: downgoing   Left: downgoing Cerebellar: normal finger-to-nose,  normal heel-to-shin test Gait:  No ataxia. CV: pulses palpable throughout    Results for orders placed during the hospital encounter of 07/08/12 (from the past 48 hour(s))  POCT I-STAT TROPONIN I     Status: None   Collection Time    07/08/12  1:39 PM      Result Value Range   Troponin i, poc 0.04  0.00 - 0.08 ng/mL   Comment 3            Comment: Due to the release kinetics of cTnI,     a negative result within the first hours     of the onset of symptoms does not rule out     myocardial infarction with certainty.     If myocardial infarction is still suspected,     repeat the test at appropriate intervals.  POCT I-STAT, CHEM 8     Status: None   Collection Time    07/08/12  1:41 PM      Result Value Range   Sodium 141  135 - 145 mEq/L   Potassium 4.6  3.5 - 5.1 mEq/L   Chloride 108  96 - 112 mEq/L   BUN 12  6 - 23 mg/dL   Creatinine, Ser 4.09  0.50 - 1.10 mg/dL   Glucose, Bld 89  70 - 99 mg/dL   Calcium, Ion 8.11  1.12 - 1.23 mmol/L   TCO2 25  0 - 100 mmol/L   Hemoglobin 14.3  12.0 - 15.0 g/dL   HCT 91.4  78.2 - 95.6 %   Ct Head Wo Contrast  07/08/2012  *RADIOLOGY REPORT*  Clinical Data: 52 year old female with left-sided weakness and slurred speech.  CT HEAD WITHOUT CONTRAST  Technique:  Contiguous axial images were obtained from the base of the skull through the vertex without contrast.  Comparison: 05/11/2012 and prior CTs.  Findings: No intracranial abnormalities are identified, including mass lesion or mass effect, hydrocephalus, extra-axial fluid collection, midline shift, hemorrhage, or acute infarction.  The visualized bony calvarium is unremarkable.  IMPRESSION: Unremarkable  noncontrast head CT.  Critical Value/emergent results were called by telephone at the time of interpretation on 07/08/2012 at 1:53 p.m. to Dr. Judd Lien, who verbally acknowledged these results.   Original Report Authenticated By: Harmon Pier, M.D.      Assessment: 52 y.o. female with multiple risk factors for stroke, brought to the ED with acute onset dysarthria, left hemiparesis, and left face symptoms. Patient's symptoms had resolved. CT brain unremarkable and her NIHSS is now 0. Probable TIA. Can not have MRI due to ICD. Will continue to  closely monitor her neurological status as per protocol to ensure that she doesn't decline clinically, as she is still within the window for thrombolytics.  Wyatt Portela ,MD [facility] [phone]  07/08/2012, 2:16 PM

## 2012-07-08 NOTE — ED Notes (Signed)
CBG 89 

## 2012-07-09 ENCOUNTER — Inpatient Hospital Stay (HOSPITAL_COMMUNITY): Payer: Medicaid Other

## 2012-07-09 DIAGNOSIS — I059 Rheumatic mitral valve disease, unspecified: Secondary | ICD-10-CM

## 2012-07-09 LAB — RAPID URINE DRUG SCREEN, HOSP PERFORMED
Barbiturates: NOT DETECTED
Tetrahydrocannabinol: NOT DETECTED

## 2012-07-09 LAB — LIPID PANEL
Cholesterol: 173 mg/dL (ref 0–200)
LDL Cholesterol: 109 mg/dL — ABNORMAL HIGH (ref 0–99)
Triglycerides: 159 mg/dL — ABNORMAL HIGH (ref <150)

## 2012-07-09 LAB — CBC
Platelets: 203 10*3/uL (ref 150–400)
RDW: 13.4 % (ref 11.5–15.5)
WBC: 7.8 10*3/uL (ref 4.0–10.5)

## 2012-07-09 LAB — BASIC METABOLIC PANEL
Calcium: 9.2 mg/dL (ref 8.4–10.5)
Creatinine, Ser: 0.68 mg/dL (ref 0.50–1.10)
GFR calc Af Amer: >90 mL/min (ref >90)
GFR calc non Af Amer: >90 mL/min (ref >90)

## 2012-07-09 LAB — GLUCOSE, CAPILLARY

## 2012-07-09 LAB — HEMOGLOBIN A1C: Hgb A1c MFr Bld: 5.6 % (ref <5.7)

## 2012-07-09 MED ORDER — LISINOPRIL 10 MG PO TABS
10.0000 mg | ORAL_TABLET | Freq: Every day | ORAL | Status: DC
  Administered 2012-07-09 – 2012-07-12 (×3): 10 mg via ORAL
  Filled 2012-07-09 (×4): qty 1

## 2012-07-09 MED ORDER — FUROSEMIDE 40 MG PO TABS
40.0000 mg | ORAL_TABLET | Freq: Two times a day (BID) | ORAL | Status: DC
  Administered 2012-07-10 – 2012-07-12 (×4): 40 mg via ORAL
  Filled 2012-07-09 (×7): qty 1

## 2012-07-09 MED ORDER — LEVOTHYROXINE SODIUM 200 MCG PO TABS
200.0000 ug | ORAL_TABLET | Freq: Every day | ORAL | Status: DC
  Administered 2012-07-11 – 2012-07-12 (×2): 200 ug via ORAL
  Filled 2012-07-09 (×4): qty 1

## 2012-07-09 MED ORDER — CARVEDILOL 12.5 MG PO TABS
12.5000 mg | ORAL_TABLET | Freq: Two times a day (BID) | ORAL | Status: DC
  Administered 2012-07-10 – 2012-07-12 (×4): 12.5 mg via ORAL
  Filled 2012-07-09 (×7): qty 1

## 2012-07-09 NOTE — Progress Notes (Signed)
Occupational Therapy Discharge Patient Details Name: Melanie Cordova MRN: [id number] DOB: 10/05/1960 Today's Date: 07/09/2012 Time:  -     Patient discharged from OT services secondary to symptoms have resolved and no OT eval needed at this time.  GO     Melanie Cordova A 07/09/2012, 4:09 PM

## 2012-07-09 NOTE — Evaluation (Signed)
Physical Therapy Evaluation Patient Details Name: Melanie Cordova MRN: [id number] DOB: 06-20-1960 Today's Date: 07/09/2012 Time:  1519- 1538  19 minutes  PT Assessment / Plan / Recommendation Clinical Impression  52 year old female admitted for slurred speech and left sided weakness.  Unable to perform MRI to rule out TIA due to ICD.  Symptoms have re.solved, per patient report she is back to baseline.  Presents to PT with below problem list.  Would benefit from acute PT to maximize activity tolerance and knowledge of energy conservation for improved QOL and functional independence.     PT Assessment  Patient needs continued PT services    Follow Up Recommendations  Home health PT vs none pending progress    Does the patient have the potential to tolerate intense rehabilitation      Barriers to Discharge None      Equipment Recommendations  shower chair    Recommendations for Other Services     Frequency Min 3X/week    Precautions / Restrictions Precautions Precautions: None Restrictions Weight Bearing Restrictions: No   Pertinent Vitals/Pain Denies pain      Mobility  Transfers Transfers: Sit to Stand;Stand to Sit Sit to Stand: 7: Independent Stand to Sit: 7: Independent Ambulation/Gait Ambulation/Gait Assistance: 6: Modified independent Ambulation Distance (Feet): 300 Feet Assistive device: None Ambulation/Gait Assistance Details: pt required seated rest break after 150 feet of ambulation and 6 stairs due to increased dyspnea (baseline COPD) Gait Pattern: Within Functional Limits Gait velocity: decreased General Gait Details: Pt had shortened stride length with a mild swayback posture during gait and decreased gait velocity. Stairs: Yes Stairs Assistance: 6: Modified independent; rail on the right; ascend/descend step to pattern Number of Stairs: 6 Wheelchair Mobility Wheelchair Mobility: No    Exercises     PT Diagnosis: Difficulty walking  PT Problem  List: Decreased activity tolerance;Obesity PT Treatment Interventions: Patient/family education;Therapeutic activities   PT Goals Acute Rehab PT Goals PT Goal Formulation: With patient Time For Goal Achievement: 07/16/12 Potential to Achieve Goals: Good Pt will Ambulate: >150 feet;Independently PT Goal: Ambulate - Progress: Goal set today Additional Goals Additional Goal #1: Pt will be able to verbalize 3 engergy conservation techniques without cueing or looking at educational materials PT Goal: Additional Goal #1 - Progress: Goal set today  Visit Information       Subjective Data  Subjective: Pt reported reported feeling better today and that her strength was almost normal. Patient Stated Goal: Pt wanted to be more independent with ADL and household activities.   Prior Functioning  Home Living Lives With: Significant other Type of Home: Apartment Home Access: Level entry Home Layout: Two level Bathroom Shower/Tub: Engineer, manufacturing systems: Standard Home Adaptive Equipment: None Prior Function Level of Independence: Independent Able to Take Stairs?: Yes Vocation: Unemployed Communication Communication: No difficulties    Cognition  Cognition Arousal/Alertness: Awake/alert Behavior During Therapy: WFL for tasks assessed/performed Overall Cognitive Status: Within Functional Limits for tasks assessed    Extremity/Trunk Assessment Right Lower Extremity Assessment RLE ROM/Strength/Tone: Within functional levels Left Lower Extremity Assessment LLE ROM/Strength/Tone: Within functional levels   Balance Balance Balance Assessed: No  End of Session PT - End of Session Equipment Utilized During Treatment: Gait belt Activity Tolerance: Patient tolerated treatment well Patient left: in bed;with call bell/phone within reach;with family/visitor present  GP     Payton Doughty 07/09/2012, 4:46 PM

## 2012-07-09 NOTE — Progress Notes (Signed)
Around approx. 1700, the patient's boyfriend asked me to step into the room because the patient was c/o of chest pain. (A few minutes before, the monitor tech paged me about this patient saying "vent. Big", unsure of what that meant, I went to check the patient's monitor which showed NSR with frequent PVCs.) Upon assessment, the patient c/o of chest pain radiating down left arm with SOB, had slurred speech, and some increased weakness on the left side. Dr. Virgina Organ notified and ordered an EKG stat and triponin levels. Also, she asked me to page Neurology. Larita Fife, Neuro PA paged and told of new sx. She told me to lay the patient flat, however the patient was unable to tolerate lying flat and insisted on sitting up. After their assessments, the patient had another episode of garbled speech around 1730, witnessed by Dr. Virgina Organ. Asked to keep patient NPO with these new findings. Will continue to monitor patient.

## 2012-07-09 NOTE — Progress Notes (Addendum)
Internal Medicine Teaching Service Progress Note:  Called by RN Enrique Sack that patient had just started eating dinner with her boyfriend in the room when she experienced sudden left sided chest pain, radiation down her left arm, slurred speech, and complaints of left sided weakness.  The symptoms resolved spontaneously within a few minutes.  She was noted to have shortness of breath but saturating 99% on room air.    I arrived to evaluate the patient, she was sitting up in bed, talking, voice seemed more high pitch than usual but not slurred.  She claimed her chest pressure had resolved and was just dull and is no longer feeling weak in her arms but feels like she cant left her left leg as high as the right one. She was alert and oriented x3 and speaking in full sentences.  EKG was reviewed that showed HR 77bpm, NSR with non-specific st and t wave changes, notable for t wave flattening/mild depression in lateral leads and leads II, III, aVL and aVF.  Stat labs for troponins were also drawn at that time.  Patient continued to feel better, her family arrived in the room during that time, she was conversing with no difficulty and wanting to finish her dinner and did not want them to leave.  PA Guy Franco from neurology also came up to see the patient during this time and evaluated her.  She did not recommend any more imaging at this time given that her repeat CT scan this afternoon was also negative.  However, she does recommend TEE and CT angio for tomorrow given Pt's decreased EF of now 15% per echo done today.    Approximately 20 minute later, I returned in the room to examine Melanie Cordova again and during our conversation, her eyes rolled back for a few seconds then returned to normal position, she went quiet and stared straight.  No shaking noticed at that time.  I then asked her to speak and she had slurred garbled confused speech but was following commands.  She was able to puff her cheeks on command but had  air coming out until I asked her to hold her lips closed which she did.  Otherwise, CN testing was unremarkable and moving all 4 extremities without difficulty, able to squeeze my fingers with both hands equally and strength and sensation equal in b/l extremities.  Her speech slowly returned back to baseline and decreased confusion.  I then discussed my findings with PA Guy Franco from neurology who said she may be having a seizure and recommended EEG at this time but to hold off on giving any Keppra at this time since symptoms had resolved.  She did say that if symptoms return, Keppra may be started.    Vitals reviewed.HR 80, RR 20, Temp 98.50F, 100% RA o2 sat, BP 122/68 General: Sitting on side of bed, short of breath, high pitch voice, coughing occasionally HEENT: PERRL, EOMI, no scleral icterus Cardiac: distant heart sounds, RRR Chest: tenderness to palpation of left chest Pulm: clear to auscultation bilaterally Abd: soft, nontender, obese, nondistended, BS present Ext: warm and well perfused, no pedal edema, +2 dp b/l, moving all 4 extremities Neuro: alert and oriented X3, cranial nerves II-XII grossly intact; however CN7 testing when puffing cheeks initially was not able to hold air, slurred speech initially then resolved, strength and sensation to light touch equal in bilateral upper and lower extremities, following commands.  Claims of numbness like feeling on right side of face crossing over  to left.  Did not wish to stand to examine gait.  Able to do heel to shin on b/l legs. Raises right leg higher than left.   Of note, Ms. Loe's daughter does endorse a hx of [place] having epilepsy but that she is not any medication for several years because she has not had any more seizure episodes.  Additionally, I later received another call from RN Radene Ou was told by Ms. Malloy's daughter that apparently their mother has a history of possibly "faking" similar episodes like this in the past.     After reviewing tele: she was noted to have brief 3 VTs on review and multiple pvcs during this time as well.   A/P: ?seizure activity vs. Multiple TIAs: discussed with PA Guy Franco and NP Annie Main -stat EEG -per PA Brown's note: if recurrent episode, start Keppra load at 500mg  IV then give every 12 hours until evaluated by neurology and notify neurology on call.  If further episodes, could re-bolus 500mg  Keppra and increase to 1000mg  Q12H.   -TEE tomorrow to look for embolic source given low EF -repeat CT head today normal unehanced head CT -carotid doppler study: no evidence of hemodynamically significant ICA stenosis with antegrade vertebral artery flow -continue ASA 325mg   -CT angio head in AM -continue to monitor closely  ?Chest pain--now resolved -consulted [facility]--will see tonight, may need to restart cardioprotective medications: home medications include BB, AceI, lasix, ASA, but HTN meds were held in setting of possible CVA on admission. -follows with Dr. Ladona Ridgel (ICD placement) and Dr. Dietrich Pates -reviewed stat ekg: HR77bpm, non-specific ST and T wave changes,: T wave flattening in lateral leads V3-V6 and III, aVL, aVF, ?st depression lead II.  -am ekg -cycle CE--first troponin at 1727 negative -Echo from today: EF down to 15% with moderately dilated LV and mild LVH with diffuse hypokinesis.  Psuedonormal LV filling pattern with abnormal relaxation and increased filled pressure grade 2 diastolic dysfunction.  Severe MR and L atrium mildly dilated.  -TEE tomorrow per neurology recommendation -continue to monitor closely -night team will re-evaluate  Signed: Darden Palmer, MD PGY-I, Internal Medicine Resident Pager: [phone]  07/09/2012,6:42 PM

## 2012-07-09 NOTE — Progress Notes (Signed)
Bilateral carotid artery duplex:  No evidence of hemodynamically significant internal carotid artery stenosis.   Vertebral artery flow is antegrade.     

## 2012-07-09 NOTE — Progress Notes (Signed)
Subjective: Ms. Chisenhall was seen and examined at bedside this morning.  She claims to be feeling back to baseline with no more weakness or speech difficulty.  She does endorse prior similar episodes in the past but not on the left side before.  She slept well and tolerated diet without complaints.  She wishes to go home today.   Objective: Vital signs in last 24 hours: Filed Vitals:   07/08/12 2200 07/08/12 2208 07/09/12 0200 07/09/12 0600  BP: 128/62  121/66 151/78  Pulse: 90 75 79 76  Temp: 97.9 F (36.6 C)  97.8 F (36.6 C) 98.4 F (36.9 C)  TempSrc:      Resp: 18 18 18 18   SpO2: 96% 98% 99% 97%   Weight change:  No intake or output data in the 24 hours ending 07/09/12 0724 Vitals reviewed. General: resting in bed  HEENT: PERRL, EOMI, no scleral icterus  Cardiac: S1 S2 heard, intermittent irregular beat, distant heart sounds due to body habitus  Pulm: distant lung sounds due to body habitus, clear to auscultation bilaterally Abd: soft, nontender, nondistended, BS present, obese  Ext: warm and well perfused, no pedal edema, +2 dp b/l, non tender to palpation. Moving all four extremities.  Neuro: alert and oriented X3, cranial nerves II-XII grossly intact, Strength and sensation equal in b/l extremities.  Able to do heel to shin testing today with no difficulty.    Lab Results: Basic Metabolic Panel:  Recent Labs Lab 07/08/12 1341  NA 141  K 4.6  CL 108  GLUCOSE 89  BUN 12  CREATININE 0.70   CBC:  Recent Labs Lab 07/08/12 1341  HGB 14.3  HCT 42.0   Hemoglobin A1C:  Recent Labs Lab 07/08/12 1955  HGBA1C 5.6   Fasting Lipid Panel:  Recent Labs Lab 07/09/12 0120  CHOL 173  HDL 32*  LDLCALC 109*  TRIG 159*  CHOLHDL 5.4   Thyroid Function Tests:  Recent Labs Lab 07/08/12 1955  TSH 0.070*   Urine Drug Screen: Drugs of Abuse     Component Value Date/Time   LABOPIA NONE DETECTED 11/06/2011 2040   COCAINSCRNUR NONE DETECTED 11/06/2011 2040   LABBENZ NONE DETECTED 11/06/2011 2040   AMPHETMU NONE DETECTED 11/06/2011 2040   THCU NONE DETECTED 11/06/2011 2040   LABBARB NONE DETECTED 11/06/2011 2040    Studies/Results: Ct Head Wo Contrast  07/08/2012  *RADIOLOGY REPORT*  Clinical Data: 52 year old female with left-sided weakness and slurred speech.  CT HEAD WITHOUT CONTRAST  Technique:  Contiguous axial images were obtained from the base of the skull through the vertex without contrast.  Comparison: 05/11/2012 and prior CTs.  Findings: No intracranial abnormalities are identified, including mass lesion or mass effect, hydrocephalus, extra-axial fluid collection, midline shift, hemorrhage, or acute infarction.  The visualized bony calvarium is unremarkable.  IMPRESSION: Unremarkable noncontrast head CT.  Critical Value/emergent results were called by telephone at the time of interpretation on 07/08/2012 at 1:53 p.m. to Dr. Judd Lien, who verbally acknowledged these results.   Original Report Authenticated By: Harmon Pier, M.D.    Dg Chest Portable 1 View  07/08/2012  *RADIOLOGY REPORT*  Clinical Data: Code stroke  PORTABLE CHEST - 1 VIEW  Comparison: 03/20/2012  Findings: Cardiac enlargement without heart failure.  Single lead pacemaker unchanged.  Negative for heart failure.  Negative for mass or pneumonia.  IMPRESSION: No acute cardiopulmonary abnormality.   Original Report Authenticated By: Janeece Riggers, M.D.    Medications: I have reviewed the patient's current medications. Scheduled  Meds: . aspirin  325 mg Oral Daily  . budesonide-formoterol  2 puff Inhalation BID  . ferrous sulfate  325 mg Oral Q breakfast  . gabapentin  300 mg Oral QHS  . heparin  5,000 Units Subcutaneous Q8H  . levothyroxine  300 mcg Oral QAC breakfast  . loratadine  10 mg Oral Daily  . simvastatin  20 mg Oral q1800  . sodium chloride  3 mL Intravenous Q12H   Continuous Infusions:  PRN Meds:.acetaminophen, albuterol, sodium chloride  Assessment/Plan: Ms. Arnall is a  52 year old morbidly obese female with extensive PMH significant for CHF, ICD placed, COPD, and HTN admitted for CVA vs. TIA.   Sudden onset left sided weakness--?TIA (transient ischemic attack) vs. CVA. Presented with sudden onset of left sided weakness relative resolved symptoms by time of admission. Given resolution of symptoms and CT head on admission unremarkable, likely TIA. Unable to obtain MRI given presence of ICD. Multiple risk factors including: HTN, smoking, and hyperlipidemia. Neurology was called on code stroke, NIHSS 6, tPA not given due to resolution of symptoms. Noted to have right sided weakness and slurred speech last month that also spontaneously resolved at that time.  -appreciate neurology following  -frequent neuro checks--monitor closely still within window of thrombolytics  -Risk Stratify: TSH 0.070, HbA1c 5.6, lipid panel: CHOL 173, LDL 109, HDL 32, TG 159, UDS negative -ASA  -2 D ECHO  -Carotid dopplers  -PT/OT/swallow eval  -Repeat CT head this afternoon  Obesity Class III--Last BMI 44.7 with weight 290lb in November 2013.  -obtain weight upon arrival to floor  -diet and exercise counseling needed   Hypertension--BP on admission 126/81. On home regimen: Coreg 50mg  BID and Lasix 80mg  BID. Will allow for permissive hypertension in setting of acute CVA. Hold anti-hypertensives in case of stroke.  -monitor vitals  -if repeat imaging, consider restarting home medications   Hypothyroidism--TSH 10/2011: 1.397. On Synthroid qd at home.  -continue home dose synthroid  -f/u TSH 0.070  Tobacco abuse--continues to smoke 4 cigarettes a day. Used to smoke 1 pack per day and started at age of 75. -smoking cessation strongly advised  -consider nicotine patch if needed   Hyperlipidemia--morbidly obese. Lipid panel 02/2012: Chol 203, HDL 29, LDL 117, TG 284. She is on Lovastatin 40mg  at home and fish oil 1g qd (although noted to have fish allergy in chart)  -continue  statin--Zocor 20mg  qd  -recommend diet and exercise control   Chronic systolic heart failure--ICD in placed. Per Echo 10/2011: EF 25-30%, LV moderately dilated, systolic function globally and moderate to severely reduced, mildly dyssynchronous septal motion, mildly calcified aortic valve, left and right atrium moderately dilated. on Lasix 80mg  BID at home. Does not appear to be in acute exacerbation at this time, baseline SOB, no desat noted on room air but easily DOE with movement. CXR on admission with no acute cardiopulmonary abnormality. ICD checked in ED and did not fire or malfunction noted. proBNP 1464 04/21/11. Troponin i poc 0.04 on admission.  -hold lasix in setting of permissive HTN, if negative imaging consider restarting  -continue statin   COPD--continue to smoke cigarettes, morbidly obese. No PFTs on chart review. On Albuterol inhaler and Symbicort inhaler and Clarinex at home. Baseline SOB per patient. No o2 desat on room air noted on admission.  -Albuterol inhalers prn q 6 hours  -Symbicort 2 puffs BID  -Clarinex daily  -consider PFTs as outpatient  -smoking cessation strongly advised  -monitor pulseox   Diet:  Heart healthy DVT Ppx: Heparin  Dispo: Disposition is deferred at this time, awaiting improvement of current medical problems. Anticipated discharge in approximately 1-2 day(s).   The patient does have a current PCP (ALLEN, DEBRA, NP), therefore is not requiring OPC follow-up after discharge.  The patient does not have transportation limitations that hinder transportation to clinic appointments.  Services Needed at time of discharge: Y = Yes, Blank = No PT:   OT:   RN:   Equipment:   Other:     LOS: 1 day   Darden Palmer 07/09/2012, 7:24 AM
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
## 2012-07-09 NOTE — Progress Notes (Signed)
  Echocardiogram 2D Echocardiogram has been performed.  Melanie Cordova [name] 07/09/2012, 10:42 AM

## 2012-07-09 NOTE — Progress Notes (Signed)
CTSP:  RE: weakness  I was called by nursing staff to see patient regarding recurrent left arm weakness and facial numbness. Upon examining patient, she had full and equal strength of UE's. Visual fields intact. Sensory exam she tells me that her left arm felt heavy while her right arm felt more numb. She says that she feels better than when she was admitted. Initial CT, then f/u CT today unremarkable.  At this point, will have patient lay flat; however, she is hungry and states that she just wants to eat. Patient also had chest pain. TTE showed decreased EF to 15%, cards consulted. Rec: TEE. I did discuss this plan with Dr. Pearlean Brownie.  The primary MD called back and I have spoken to them regarding an episode that the patient subsequently had where patients eyes rolled back into head, some garbled speech, then confusion over a few minutes now returning to baseline. I discussed possibility of seizure, rec: EEG. If recurrent episode, ok to start Keppra load at 500mg  IV, then give same every 12 hours until evaluated by neurology. If further episodes, could re-bolus with 500 mg Keppra , could increase to 1000 mg Q 12 hours. Family did tell me that she had a history of seizure years ago. I did recommend to review telemetry to see if a slow dysrthymia had occurred which perhaps did not cause her ICD to go off (ie slow VT).   Primary team/nursing advised to call if further concerns/events.  Gwendolyn Lima. Manson Passey, [facility], MBA, MHA [facility] Pager: [phone] 07/09/2012 6:17 PM   I agree with above Delia Heady, MD
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
## 2012-07-09 NOTE — H&P (Signed)
Internal Medicine Teaching Service Attending Note Date: 07/09/2012  Patient name: Melanie Cordova  Medical record number: [id number]  Date of birth: 12/07/1960   I have seen and evaluated Melanie Cordova and discussed their care with the Residency Team. Please see Dr Alford Highland H&P for full details. [name] is a 52 yo who was in usual state of health until day of admission. She had been cooking, went upstairs and had a good BM, then went to neighbors with her live in boyfriend to play cards. She was sitting in the shade but got a HA and then got hot, started sweating, dyspneic, and then suddenly developed L sided weakness and slurred speech. She was standing when this occurred but her boyfriend and the neighbor were supporting her so she didn't fall. 911 was called and she was transported to the ED. Her sxs were improving, but not resolved, on presentation so tPA was not given. Her sxs completely resolved when got to room. She doesn't take a daily ASA at home.  She has had somewhat similar episodes in past. They all occurred on the R side - mostly face but she also described R hand involvement. She always went to the ED and per pt was told it was Bell's Palsy. She states she never heard the term stroke or TIA. The last time was 05/11/12.  Other sxs include weakness when standing too long but she never falls. Tingling in hands and feet periodically. Heat intolerance. Severe dyspnea and DOE - making her bed makes her SOB. She does not have cough, sputum, CP, palp, N/V/D, insomnia. Of note, pt was told that she was checked for OSA and she doesn't have it.  She smokes but is trying to quit. Her boyfriend smokes also.   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 Hypothyroidism on synthroid (out of  one week). Asthma treated with Alb, symbicort, GERD, tx with H2B, and HLD treated with a statin. OF note she was on Crestor but was unable to afford the copay so was changed on 3/27 to lovastatin 40 mg and a FLP was ordered for one month.  Physical Exam: Blood pressure 117/57, pulse 75, temperature 98.5 F (36.9 C), temperature source Oral, resp. rate 17, SpO2 97.00%. GEN Sitting SOB, NAD, speaks in full sentences.  HEENT sclera clear, EOMI, Maynard, AT, muc mem moist Neck large HRRR no MRG LCTAB but distant 2/2 body habitus Abd obese, + BS Ext no edema Neuro No focal  Lab results: Results for orders placed during the hospital encounter of 07/08/12 (from the past 24 hour(s))  HEMOGLOBIN A1C     Status: None   Collection Time    07/08/12  7:55 PM      Result Value Range   Hemoglobin A1C 5.6  <5.7 %   Mean Plasma Glucose 114  <117 mg/dL  TSH     Status: Abnormal   Collection Time    07/08/12  7:55 PM      Result Value Range   TSH 0.070 (*) 0.350 - 4.500 uIU/mL  GLUCOSE, CAPILLARY     Status: None   Collection Time    07/08/12  7:57 PM      Result Value Range   Glucose-Capillary 93  70 - 99 mg/dL  LIPID  PANEL     Status: Abnormal   Collection Time    07/09/12  1:20 AM      Result Value Range   Cholesterol 173  0 - 200 mg/dL   Triglycerides 960 (*) <150 mg/dL   HDL 32 (*) >45 mg/dL   Total CHOL/HDL Ratio 5.4     VLDL 32  0 - 40 mg/dL   LDL Cholesterol 409 (*) 0 - 99 mg/dL  BASIC METABOLIC PANEL     Status: Abnormal   Collection Time    07/09/12  6:55 AM      Result Value Range   Sodium 139  135 - 145 mEq/L   Potassium 3.9  3.5 - 5.1 mEq/L   Chloride 106  96 - 112 mEq/L   CO2 25  19 - 32 mEq/L   Glucose, Bld 102 (*) 70 - 99 mg/dL   BUN 10  6 - 23 mg/dL   Creatinine, Ser 8.11  0.50 - 1.10 mg/dL   Calcium 9.2  8.4 - 91.4 mg/dL   GFR calc non Af Amer >90  >90 mL/min   GFR calc Af Amer >90  >90 mL/min  CBC     Status: None   Collection Time    07/09/12  6:55 AM       Result Value Range   WBC 7.8  4.0 - 10.5 K/uL   RBC 4.70  3.87 - 5.11 MIL/uL   Hemoglobin 13.7  12.0 - 15.0 g/dL   HCT 78.2  95.6 - 21.3 %   MCV 87.2  78.0 - 100.0 fL   MCH 29.1  26.0 - 34.0 pg   MCHC 33.4  30.0 - 36.0 g/dL   RDW 08.6  57.8 - 46.9 %   Platelets 203  150 - 400 K/uL  URINE RAPID DRUG SCREEN (HOSP PERFORMED)     Status: None   Collection Time    07/09/12  8:10 AM      Result Value Range   Opiates NONE DETECTED  NONE DETECTED   Cocaine NONE DETECTED  NONE DETECTED   Benzodiazepines NONE DETECTED  NONE DETECTED   Amphetamines NONE DETECTED  NONE DETECTED   Tetrahydrocannabinol NONE DETECTED  NONE DETECTED   Barbiturates NONE DETECTED  NONE DETECTED    Imaging results:  Ct Head Without Contrast  07/09/2012  *RADIOLOGY REPORT*  Clinical Data: The patient came to the Hospital yesterday with stroke-like symptoms with head pain and trouble speaking and left leg dragging and left arm weakness.  Improve this morning.  CT HEAD WITHOUT CONTRAST  Technique:  Contiguous axial images were obtained from the base of the skull through the vertex without contrast.  Comparison: 07/08/2012  Findings: No change from the prior study.  The ventricles are normal in size and configuration.  There are no parenchymal masses mass effect, no areas of abnormal parenchymal attenuation and no extra-axial masses or abnormal fluid collections.  There is no evidence of a recent infarct and there is no intracranial hemorrhage.  The sinuses and mastoid air cells are clear.  IMPRESSION: Normal unenhanced head CT.   Original Report Authenticated By: Amie Portland, M.D.    Ct Head Wo Contrast  07/08/2012  *RADIOLOGY REPORT*  Clinical Data: 52 year old female with left-sided weakness and slurred speech.  CT HEAD WITHOUT CONTRAST  Technique:  Contiguous axial images were obtained from the base of the skull through the vertex without contrast.  Comparison: 05/11/2012 and prior CTs.  Findings: No intracranial  abnormalities are  identified, including mass lesion or mass effect, hydrocephalus, extra-axial fluid collection, midline shift, hemorrhage, or acute infarction.  The visualized bony calvarium is unremarkable.  IMPRESSION: Unremarkable noncontrast head CT.  Critical Value/emergent results were called by telephone at the time of interpretation on 07/08/2012 at 1:53 p.m. to Dr. Judd Lien, who verbally acknowledged these results.   Original Report Authenticated By: Harmon Pier, M.D.    Dg Chest Portable 1 View  07/08/2012  *RADIOLOGY REPORT*  Clinical Data: Code stroke  PORTABLE CHEST - 1 VIEW  Comparison: 03/20/2012  Findings: Cardiac enlargement without heart failure.  Single lead pacemaker unchanged.  Negative for heart failure.  Negative for mass or pneumonia.  IMPRESSION: No acute cardiopulmonary abnormality.   Original Report Authenticated By: Janeece Riggers, M.D.     Assessment and Plan: I agree with the formulated Assessment and Plan with the following changes:   1. Presumed TIA / CVA - 1st and 2nd CT heads were negative. Pt on ASA full dose. Her carotids were nl. Her TTE showed that EF had decreased to 15% but no embolic source. Neuro is planning TEE in AM. Will need LDL control but her LDL was not controlled on Crestor and pt was not able to afford co pay so changed to lovastatin which is lower potency. Will need to see if she can afford co pay of higher potency statin.  2. Non ischemic systolic CM - has had episodes of HF. Had been out of BB and ACEI was taken off med list by ER pharm tech in Jan 2014. Those meds held 2/2 presumed CVA and need for permissive HTN.  3. Chronic resp failure - I suspect she has COPD either than or in addition to asthma. She would benefit from outpt PFT's. Would also benefit from card pul rehab if there is a center in [place].   4. Tobacco use - told pt that both she and her boyfriend needed to stop.  Of note, Dr Virgina Organ paged me at 5:30 PM stating Ms Greenhalgh c/o CP,  SOB, L arm pain, and new weakness. She called neuro and informed them of the new weakness and they stated they would eval as Dr Virgina Organ queried whether a code stroke should be called. CE enzymes will be cycled. EKG non specific EKG changes. She will ask Neuro whether we can safely add back BB even though CHF is non ischemic.  Burns Spain, MD 4/28/20144:57 PM

## 2012-07-09 NOTE — Progress Notes (Signed)
Stroke Team Progress Note  HISTORY Melanie Cordova is an 52 y.o. female, right handed, with a past medical history significant for hypertension, hyperlipidemia, cardiomyopathy with EF 15%, s/p ICD placement, smoker, obesity, anxiety, probable TIA's, brought to the ED by medics as a code stroke due to acute onset dysarthria, left face weakness, and left hemiparesis.  She was known well at 12:38 pm 07/08/2012 when she was at home taking to her boyfriend when suddenly developed slurred speech, left face droopiness, and left arm and leg weakness. No associated headache, vertigo, double vision, difficulty swallowing, confusion, or seizure-like activity.  EMS called. Upon arrival to ED she had a NIHSS 6 and emergent CT brain showed no acute intracranial abnormality. We reevaluated the patient after coming back from CT and her symptoms had virtually resolved.  Importantly, she had prior ED evaluations, the last one 04/2012, due to right sided weakness with slurred speech that rapidly resolved and unremarkable work up.  Patient was not a TPA candidate secondary to resolved symptoms. She was admitted for further evaluation and treatment.  SUBJECTIVE Her boyfriend is at the bedside.  Overall she feels her condition is completely resolved.   OBJECTIVE Most recent Vital Signs: Filed Vitals:   07/09/12 0200 07/09/12 0600 07/09/12 0812 07/09/12 1100  BP: 121/66 151/78  134/73  Pulse: 79 76  64  Temp: 97.8 F (36.6 C) 98.4 F (36.9 C)  98 F (36.7 C)  TempSrc:    Oral  Resp: 18 18  18   SpO2: 99% 97% 97% 96%   CBG (last 3)   Recent Labs  07/08/12 1321 07/08/12 1957  GLUCAP 89 93    IV Fluid Intake:     MEDICATIONS  . aspirin  325 mg Oral Daily  . budesonide-formoterol  2 puff Inhalation BID  . ferrous sulfate  325 mg Oral Q breakfast  . gabapentin  300 mg Oral QHS  . heparin  5,000 Units Subcutaneous Q8H  . levothyroxine  300 mcg Oral QAC breakfast  . loratadine  10 mg Oral Daily  .  simvastatin  20 mg Oral q1800  . sodium chloride  3 mL Intravenous Q12H   PRN:  acetaminophen, albuterol, sodium chloride  Diet:  Cardiac thin liquids Activity:   Bathroom privileges with assistance DVT Prophylaxis:  Heparin 5000 units sq tid   CLINICALLY SIGNIFICANT STUDIES Basic Metabolic Panel:  Recent Labs Lab 07/08/12 1341 07/09/12 0655  NA 141 139  K 4.6 3.9  CL 108 106  CO2  --  25  GLUCOSE 89 102*  BUN 12 10  CREATININE 0.70 0.68  CALCIUM  --  9.2   Liver Function Tests: No results found for this basename: AST, ALT, ALKPHOS, BILITOT, PROT, ALBUMIN,  in the last 168 hours CBC:  Recent Labs Lab 07/08/12 1341 07/09/12 0655  WBC  --  7.8  HGB 14.3 13.7  HCT 42.0 41.0  MCV  --  87.2  PLT  --  203   Coagulation: No results found for this basename: LABPROT, INR,  in the last 168 hours Cardiac Enzymes: No results found for this basename: CKTOTAL, CKMB, CKMBINDEX, TROPONINI,  in the last 168 hours Urinalysis: No results found for this basename: COLORURINE, APPERANCEUR, LABSPEC, PHURINE, GLUCOSEU, HGBUR, BILIRUBINUR, KETONESUR, PROTEINUR, UROBILINOGEN, NITRITE, LEUKOCYTESUR,  in the last 168 hours Lipid Panel    Component Value Date/Time   CHOL 173 07/09/2012 0120   TRIG 159* 07/09/2012 0120   HDL 32* 07/09/2012 0120   CHOLHDL 5.4 07/09/2012  0120   VLDL 32 07/09/2012 0120   LDLCALC 109* 07/09/2012 0120   HgbA1C  Lab Results  Component Value Date   HGBA1C 5.6 07/08/2012    Urine Drug Screen:     Component Value Date/Time   LABOPIA NONE DETECTED 07/09/2012 0810   COCAINSCRNUR NONE DETECTED 07/09/2012 0810   LABBENZ NONE DETECTED 07/09/2012 0810   AMPHETMU NONE DETECTED 07/09/2012 0810   THCU NONE DETECTED 07/09/2012 0810   LABBARB NONE DETECTED 07/09/2012 0810    Alcohol Level: No results found for this basename: ETH,  in the last 168 hours  CT of the brain  07/08/2012  Unremarkable noncontrast head CT.    CT angio of the brain  MRI/A of the brain  pacemaker  2D  Echocardiogram  EF 150% with no source of embolus. Diffuse hypokinesis. Mild LVH  Carotid Doppler  No evidence of hemodynamically significant internal carotid artery stenosis. Vertebral artery flow is antegrade.   CXR  07/08/2012   No acute cardiopulmonary abnormality.      EKG  normal sinus rhythm.   Therapy Recommendations no OT needs, ? HH needs pending progress  Physical Exam   Obese middle aged Caucasian lady not in distress.Awake alert. Afebrile. Head is nontraumatic. Neck is supple without bruit. Hearing is normal. Cardiac exam no murmur or gallop. Lungs are clear to auscultation. Distal pulses are well felt. Neurological Exam : Awake alert oriented x 3 normal speech and language. No face asymmetry. Tongue midline. No drift. Mild diminished fine finger movements on left. Orbits right over left upper extremity. Mild left grip weak.. Normal sensation . Normal coordination. ASSESSMENT Melanie Cordova is a 52 y.o. female presenting with slurred speech, left face droopiness, and left arm and leg weakness. Initial imaging unrevealing. Unable to get MRI due to pacer. ? Right brain stroke vs TIA. On no antiplatelets prior to admission. Now on aspirin 325 mg orally every day for secondary stroke prevention. Patient with essentially no resultant neuro symptoms. Work up underway.   Hypertension  Cardiomyopathy, EF 15% Hyperlipidemia, LDL 109, on statin PTA, on statin now, goal LDL < 100  Obesity, There is no weight on file to calculate BMI.   Cigarette smoker, 0.5-1 PPD  Hospital day # 1  TREATMENT/PLAN  Continue aspirin 325 mg orally every day for secondary stroke prevention.  CT angio of the head in the am to confirm/refute stroke, look at vasculature Recommend TEE to look for embolic source given low EF. Please arrange with pts cardiologist or cardiologist of choice. If positive for PFO (patent foramen ovale), check bilateral lower extremity venous dopplers to rule out DVT as  possible source of stroke.   Annie Main, MSN, RN, ANVP-BC, ANP-BC, [facility] Pager: [phone] 07/09/2012 11:39 AM  I have personally obtained a history, examined the patient, evaluated imaging results, and formulated the assessment and plan of care. I agree with the above.  Delia Heady, MD

## 2012-07-09 NOTE — Evaluation (Signed)
I have read and agree with the below assessment and plan.   Shekinah Pitones [name] Whitlow PT, DPT Pager: [phone] 

## 2012-07-09 NOTE — Progress Notes (Signed)
Patient reports spasms in both legs.

## 2012-07-09 NOTE — Progress Notes (Signed)
PT Cancellation Note  Patient Details Name: Melanie Cordova MRN: [id number] DOB: 1961/01/11   Cancelled Treatment:    Reason Eval/Treat Not Completed: Patient at procedure or test/unavailable. Will f/u as time and schedule allow.   [facility] [name] 07/09/2012, 11:01 AM Pager: [phone]

## 2012-07-10 ENCOUNTER — Inpatient Hospital Stay (HOSPITAL_COMMUNITY): Payer: Medicaid Other

## 2012-07-10 DIAGNOSIS — I6350 Cerebral infarction due to unspecified occlusion or stenosis of unspecified cerebral artery: Secondary | ICD-10-CM

## 2012-07-10 LAB — BASIC METABOLIC PANEL
CO2: 25 mEq/L (ref 19–32)
Calcium: 9.3 mg/dL (ref 8.4–10.5)
Chloride: 104 mEq/L (ref 96–112)
GFR calc Af Amer: >90 mL/min (ref >90)
GFR calc non Af Amer: >90 mL/min (ref >90)
Glucose, Bld: 93 mg/dL (ref 70–99)
Sodium: 139 mEq/L (ref 135–145)

## 2012-07-10 LAB — TROPONIN I
Troponin I: <0.3 ng/mL (ref <0.30)
Troponin I: <0.3 ng/mL (ref <0.30)

## 2012-07-10 NOTE — Progress Notes (Signed)
I have read and agree with the below treatment note and plan. Shakeda Pearse [name] Whitlow PT, DPT Pager: [phone] 

## 2012-07-10 NOTE — Progress Notes (Signed)
Physical Therapy Treatment Patient Details Name: Melanie Cordova MRN: [id number] DOB: [birth year] Today's Date: 07/10/2012 Time: 4098-1191 PT Time Calculation (min): 26 min  PT Assessment / Plan / Recommendation Comments on Treatment Session  Pt showed willingness to perfrom all activities.  6 minute walk test performed to observe functional limitations of ambulation due to dyspnea and COPD.  Educated pt on importance of energy conservation techniques and gave a handout.  At the next session, introduce pursed lip breathing, HEP, falls risk education.    Follow Up Recommendations  Home health PT vs. Outpatient PT (Home health preferred, but OP if insurance won't cover [facility])     Does the patient have the potential to tolerate intense rehabilitation     Barriers to Discharge        Equipment Recommendations  shower chair   Recommendations for Other Services    Frequency Min 3X/week   Plan Discharge plan needs to be updated;Frequency remains appropriate    Precautions / Restrictions Restrictions Weight Bearing Restrictions: No   Pertinent Vitals/Pain HR: 90bpm immediately after exercise SpO2: 98 immediately after exercies    Mobility  Bed Mobility Bed Mobility: Sit to Supine;Supine to Sit Supine to Sit: 5: Supervision Sit to Supine: 5: Supervision Transfers Transfers: Sit to Stand;Stand to Sit Sit to Stand: 5: Supervision Stand to Sit: 5: Supervision Ambulation/Gait Ambulation/Gait Assistance: 4: Min assist Ambulation Distance (Feet): 471 Feet.  Ambulation less than 1050 feet on the has been shown to increase mortality rates in patients with similar level COPD. Gait Pattern: Decreased stride length Gait velocity: decreased General Gait Details: Modified 6 minute walk test performed.  Pt needed to stop 4 times during the test.  The first stop was for dizziness, the rest were for dyspnea.  Final rest break caused by Dyspnea of 3/4 within last 30 seconds of test.   Stairs:  No Wheelchair Mobility Wheelchair Mobility: No     PT Goals    Visit Information  Assistance Needed: +1    Subjective Data  Subjective: Pt reported that she was feeling better since exacerbation and that there were no residual symptoms.  Complained of dizziness during ambulation, stating that her "inner ear needs to be drained". Patient Stated Goal: wants to return home   Cognition  Cognition Arousal/Alertness: Awake/alert Behavior During Therapy: WFL for tasks assessed/performed Overall Cognitive Status: Within Functional Limits for tasks assessed    Balance  Balance Balance Assessed: Yes Static Standing Balance Rhomberg - Eyes Opened: 30 (moderate postural sway) Rhomberg - Eyes Closed: 18 (second attempt) Patients under age 3 should be able to complete eyes open and eyes closed rhomberg.  Increased sway with eyes open and decreased ability to balance with eyes closed shows diminished static balance and need for PT address.   End of Session PT - End of Session Equipment Utilized During Treatment: Gait belt Activity Tolerance: Other (comment) (Activity limited due to shortness of breath) Patient left: in bed;with call bell/phone within reach;with bed alarm set   GP     Payton Doughty 07/10/2012, 3:27 PM
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
## 2012-07-10 NOTE — Procedures (Addendum)
EEG report.  Brief clinical history: 52 years old female with transient episodes of left or right sided weakness, slurred speech, and a concerning episode during this admission characterized by eyes rolled back into head, some garbled speech, then confusion over a few minutes now returning to baseline. No prior history of frank epileptic seizures.  Technique: this is a 17 channel routine scalp EEG performed at the bedside with bipolar and monopolar montages arranged in accordance to the international 10/20 system of electrode placement. One channel was dedicated to EKG recording.  The study was performed during wakefulness, drowsiness, and stage 2 sleep. No activating procedures performed during the test.  Description:In the wakeful state, the best background consisted of a low voltage, posterior dominant, well sustained, symmetric and reactive 10 Hz rhythm. Drowsiness demonstrated dropout of the alpha rhythm. Stage 2 sleep showed symmetric and synchronous sleep spindles without intermixed epileptiform discharges. No focal or generalized epileptiform discharges noted.  No slowing seen.  EKG showed sinus rhythm.  Impression: this is a normal awake and asleep EEG. Please, be aware that a normal EEG does not exclude the possibility of epilepsy.  Clinical correlation is advised.  Wyatt Portela, MD

## 2012-07-10 NOTE — Progress Notes (Signed)
STAT EEG Completed; results pending

## 2012-07-10 NOTE — Progress Notes (Signed)
Pt returned to Unit from CT and EEG remained NPO for TEE  Medication not given since pt NPO for TEE. Azzie Roup Rn

## 2012-07-11 ENCOUNTER — Encounter (HOSPITAL_COMMUNITY)
Admission: EM | Disposition: A | Discharge status: Home / Self Care | Payer: Self-pay | Source: Home / Self Care | Attending: Internal Medicine

## 2012-07-11 DIAGNOSIS — I059 Rheumatic mitral valve disease, unspecified: Secondary | ICD-10-CM

## 2012-07-11 HISTORY — PX: TEE WITHOUT CARDIOVERSION: SHX5443

## 2012-07-11 SURGERY — ECHOCARDIOGRAM, TRANSESOPHAGEAL
Anesthesia: Moderate Sedation

## 2012-07-11 MED ORDER — BUTAMBEN-TETRACAINE-BENZOCAINE 2-2-14 % EX AERO
INHALATION_SPRAY | CUTANEOUS | Status: DC | PRN
  Administered 2012-07-11: 2 via TOPICAL

## 2012-07-11 MED ORDER — SODIUM CHLORIDE 0.9 % IV SOLN: INTRAVENOUS | Status: DC

## 2012-07-11 MED ORDER — MIDAZOLAM HCL 10 MG/2ML IJ SOLN
INTRAMUSCULAR | Status: DC | PRN
  Administered 2012-07-11: 2 mg via INTRAVENOUS
  Administered 2012-07-11: 1 mg via INTRAVENOUS
  Administered 2012-07-11: 2 mg via INTRAVENOUS

## 2012-07-11 MED ORDER — FENTANYL CITRATE 0.05 MG/ML IJ SOLN
INTRAMUSCULAR | Status: DC | PRN
  Administered 2012-07-11 (×2): 25 ug via INTRAVENOUS

## 2012-07-11 NOTE — Interval H&P Note (Signed)
History and Physical Interval Note:  07/11/2012 11:34 AM  Melanie Cordova  has presented today for surgery, with the diagnosis of stroke  The various methods of treatment have been discussed with the patient and family. After consideration of risks, benefits and other options for treatment, the patient has consented to  Procedure(s): TRANSESOPHAGEAL ECHOCARDIOGRAM (TEE) (N/A) as a surgical intervention .  The patient's history has been reviewed, patient examined, no change in status, stable for surgery.  I have reviewed the patient's chart and labs.  Questions were answered to the patient's satisfaction.     Elyn Aquas.

## 2012-07-11 NOTE — Progress Notes (Addendum)
Physical Therapy Treatment Patient Details Name: Melanie Cordova MRN: [id number] DOB: January 05, 1961 Today's Date: 07/11/2012 Time: 4098-1191 PT Time Calculation (min): 23 min  PT Assessment / Plan / Recommendation Comments on Treatment Session  Pt was willing to participate in all activites.  HEP was administered and education on pursed lip breathing was provided, but reinforcement is needed.     Follow Up Recommendations  Outpatient PT (balance)     Does the patient have the potential to tolerate intense rehabilitation     Barriers to Discharge        Equipment Recommendations       Recommendations for Other Services    Frequency Min 3X/week   Plan Discharge plan remains appropriate    Precautions / Restrictions Precautions Precautions: None   Pertinent Vitals/Pain HR 85 immediately after exercise    Mobility  Bed Mobility Supine to Sit: 7: Independent Sit to Supine: 7: Independent Transfers Transfers: Sit to Stand;Stand to Sit Sit to Stand: 7: Independent Stand to Sit: 7: Independent Ambulation/Gait Ambulation/Gait Assistance: 6: Modified independent (Device/Increase time) Ambulation Distance (Feet): 100 Feet Gait Pattern: Decreased stride length;Wide base of support Gait velocity: Decreased General Gait Details: Short step length with a wide base of support and low step height. Stairs: No    Exercises:     HEP introduced (hand out provided) including:        sit<>stand activities, 30 second standing with minimized BOS, walking and pursed lip breathing education  PT Goals Acute Rehab PT Goals PT Goal Formulation: With patient PT Goal: Ambulate - Progress: Progressing toward goal Additional Goals PT Goal: Additional Goal #1 - Progress: Progressing toward goal  Visit Information  Last PT Received On: 07/11/12    Subjective Data  Subjective: Pt says she is feeling better and denies any more dizziness since last treatment session.  Patient Stated Goal: Wants  to go home soon.   Cognition  Cognition Arousal/Alertness: Awake/alert Behavior During Therapy: WFL for tasks assessed/performed Overall Cognitive Status: Within Functional Limits for tasks assessed    Balance  Balance Balance Assessed: Yes Static Standing Balance Rhomberg - Eyes Opened: 30 (performed twice)  End of Session PT - End of Session Equipment Utilized During Treatment: Gait belt Patient left: in bed;with call bell/phone within reach   GP     Payton Doughty 07/11/2012, 2:23 PM   I have read and edited the above note and agree with treatment session and plan. Ivonne Andrew PT, DPT Pager: [phone]

## 2012-07-11 NOTE — Progress Notes (Signed)
   SUBJECTIVE: occasional chest pain when under stress. No worsening dyspnea. No orthopnea, PND or edema.    Filed Vitals:   07/10/12 2205 07/11/12 0138 07/11/12 0554 07/11/12 0741  BP:  101/43 108/58   Pulse:  74 71   Temp:  98.4 F (36.9 C) 98.2 F (36.8 C)   TempSrc:  Oral Oral   Resp:  20 20   Height: 5' 7" (1.702 m)     Weight: 127.5 kg (281 lb 1.4 oz)     SpO2:  98% 99% 98%    Intake/Output Summary (Last 24 hours) at 07/11/12 0743 Last data filed at 07/10/12 2200  Gross per 24 hour  Intake    720 ml  Output    600 ml  Net    120 ml    LABS: Basic Metabolic Panel:  Recent Labs  07/09/12 0655 07/10/12 0615  NA 139 139  K 3.9 4.0  CL 106 104  CO2 25 25  GLUCOSE 102* 93  BUN 10 9  CREATININE 0.68 0.66  CALCIUM 9.2 9.3   Liver Function Tests: No results found for this basename: AST, ALT, ALKPHOS, BILITOT, PROT, ALBUMIN,  in the last 72 hours No results found for this basename: LIPASE, AMYLASE,  in the last 72 hours CBC:  Recent Labs  07/08/12 1341 07/09/12 0655  WBC  --  7.8  HGB 14.3 13.7  HCT 42.0 41.0  MCV  --  87.2  PLT  --  203   Cardiac Enzymes:  Recent Labs  07/09/12 1727 07/09/12 2340 07/10/12 0615  TROPONINI <0.30 <0.30 <0.30   BNP: No components found with this basename: POCBNP,  D-Dimer: No results found for this basename: DDIMER,  in the last 72 hours Hemoglobin A1C:  Recent Labs  07/08/12 1955  HGBA1C 5.6   Fasting Lipid Panel:  Recent Labs  07/09/12 0120  CHOL 173  HDL 32*  LDLCALC 109*  TRIG 159*  CHOLHDL 5.4   Thyroid Function Tests:  Recent Labs  07/08/12 1955 07/10/12 0615  TSH 0.070*  --   T3FREE  --  3.1   Anemia Panel: No results found for this basename: VITAMINB12, FOLATE, FERRITIN, TIBC, IRON, RETICCTPCT,  in the last 72 hours   PHYSICAL EXAM General: Well developed, well nourished, in no acute distress HEENT:  Normocephalic and atramatic Neck:  No JVD.  Lungs: Clear bilaterally to  auscultation and percussion. Heart: HRRR . Normal S1 and S2 without gallops or murmurs.  Abdomen: Bowel sounds are positive, abdomen soft and non-tender  Msk:  Back normal, normal gait. Normal strength and tone for age. Extremities: No clubbing, cyanosis or edema.   Neuro: Alert and oriented X 3. Psych:  Good affect, responds appropriately  TELEMETRY: Reviewed telemetry pt in NSR.  ASSESSMENT AND PLAN: 1. TIA: she is scheduled for TEE today to evaluate cardiac source of embolism.  2. Chronic systolic heart failure: with severely reduced LVSF (presumed to be non-ischemic).  she seems to be euvolemic. She is on Carvedilol and Lisinopril. She would benefit from Aldosterone blockade but she reports being allergic (or intolerant) to Spironolactone. Can consider trying Eplerenone.   No plans for cardiac cath during this admission.   Muhammad Arida, MD, FACC 07/11/2012 7:43 AM     

## 2012-07-11 NOTE — CV Procedure (Signed)
    Transesophageal Echocardiogram Note  Melanie Cordova [id number] March 02, 1961  Procedure: Transesophageal Echocardiogram Indications: CVA  Procedure Details Consent: Obtained Time Out: Verified patient identification, verified procedure, site/side was marked, verified correct patient position, special equipment/implants available, Radiology Safety Procedures followed,  medications/allergies/relevent history reviewed, required imaging and test results available.  Performed  Medications: Fentanyl: 50 mcg IV  Versed: 5 mg IV  Left Ventrical:  Marked LV dysfunction.  EF 20%  Mitral Valve: mild-moderate  MR  Aortic Valve: normal LV   Tricuspid Valve: normal  Pulmonic Valve: normal  Left Atrium/ Left atrial appendage: no LAA thrombus  Atrial septum: no ASD or PFO  Aorta: normal  Complications: No apparent complications Patient did tolerate procedure well.   Melanie Cordova, Montez Hageman., MD, [facility] 07/11/2012, 11:52 AM

## 2012-07-11 NOTE — Progress Notes (Signed)
  Echocardiogram Echocardiogram Transesophageal has been performed.  Georgian Co 07/11/2012, 12:17 PM
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
## 2012-07-11 NOTE — H&P (View-Only) (Signed)
   SUBJECTIVE: occasional chest pain when under stress. No worsening dyspnea. No orthopnea, PND or edema.    Filed Vitals:   07/10/12 2205 07/11/12 0138 07/11/12 0554 07/11/12 0741  BP:  101/43 108/58   Pulse:  74 71   Temp:  98.4 F (36.9 C) 98.2 F (36.8 C)   TempSrc:  Oral Oral   Resp:  20 20   Height: 5\' 7"  (1.702 m)     Weight: 127.5 kg (281 lb 1.4 oz)     SpO2:  98% 99% 98%    Intake/Output Summary (Last 24 hours) at 07/11/12 0743 Last data filed at 07/10/12 2200  Gross per 24 hour  Intake    720 ml  Output    600 ml  Net    120 ml    LABS: Basic Metabolic Panel:  Recent Labs  14/78/29 0655 07/10/12 0615  NA 139 139  K 3.9 4.0  CL 106 104  CO2 25 25  GLUCOSE 102* 93  BUN 10 9  CREATININE 0.68 0.66  CALCIUM 9.2 9.3   Liver Function Tests: No results found for this basename: AST, ALT, ALKPHOS, BILITOT, PROT, ALBUMIN,  in the last 72 hours No results found for this basename: LIPASE, AMYLASE,  in the last 72 hours CBC:  Recent Labs  07/08/12 1341 07/09/12 0655  WBC  --  7.8  HGB 14.3 13.7  HCT 42.0 41.0  MCV  --  87.2  PLT  --  203   Cardiac Enzymes:  Recent Labs  07/09/12 1727 07/09/12 2340 07/10/12 0615  TROPONINI <0.30 <0.30 <0.30   BNP: No components found with this basename: POCBNP,  D-Dimer: No results found for this basename: DDIMER,  in the last 72 hours Hemoglobin A1C:  Recent Labs  07/08/12 1955  HGBA1C 5.6   Fasting Lipid Panel:  Recent Labs  07/09/12 0120  CHOL 173  HDL 32*  LDLCALC 109*  TRIG 159*  CHOLHDL 5.4   Thyroid Function Tests:  Recent Labs  07/08/12 1955 07/10/12 0615  TSH 0.070*  --   T3FREE  --  3.1   Anemia Panel: No results found for this basename: VITAMINB12, FOLATE, FERRITIN, TIBC, IRON, RETICCTPCT,  in the last 72 hours   PHYSICAL EXAM General: Well developed, well nourished, in no acute distress HEENT:  Normocephalic and atramatic Neck:  No JVD.  Lungs: Clear bilaterally to  auscultation and percussion. Heart: HRRR . Normal S1 and S2 without gallops or murmurs.  Abdomen: Bowel sounds are positive, abdomen soft and non-tender  Msk:  Back normal, normal gait. Normal strength and tone for age. Extremities: No clubbing, cyanosis or edema.   Neuro: Alert and oriented X 3. Psych:  Good affect, responds appropriately  TELEMETRY: Reviewed telemetry pt in NSR.  ASSESSMENT AND PLAN: 1. TIA: she is scheduled for TEE today to evaluate cardiac source of embolism.  2. Chronic systolic heart failure: with severely reduced LVSF (presumed to be non-ischemic).  she seems to be euvolemic. She is on Carvedilol and Lisinopril. She would benefit from Aldosterone blockade but she reports being allergic (or intolerant) to Spironolactone. Can consider trying Eplerenone.   No plans for cardiac cath during this admission.   Lorine Bears, MD, [facility] 07/11/2012 7:43 AM
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
## 2012-07-11 NOTE — Progress Notes (Signed)
Stroke Team Progress Note  HISTORY Melanie Cordova is an 52 y.o. female, right handed, with a past medical history significant for hypertension, hyperlipidemia, cardiomyopathy with EF 15%, s/p ICD placement, smoker, obesity, anxiety, probable TIA's, brought to the ED by medics as a code stroke due to acute onset dysarthria, left face weakness, and left hemiparesis.  She was known well at 12:38 pm 07/08/2012 when she was at home taking to her boyfriend when suddenly developed slurred speech, left face droopiness, and left arm and leg weakness. No associated headache, vertigo, double vision, difficulty swallowing, confusion, or seizure-like activity.  EMS called. Upon arrival to ED she had a NIHSS 6 and emergent CT brain showed no acute intracranial abnormality. We reevaluated the patient after coming back from CT and her symptoms had virtually resolved.  Importantly, she had prior ED evaluations, the last one 04/2012, due to right sided weakness with slurred speech that rapidly resolved and unremarkable work up.  Patient was not a TPA candidate secondary to resolved symptoms. She was admitted for further evaluation and treatment.  SUBJECTIVE Patient currently in endo, post TEE.   OBJECTIVE Most recent Vital Signs: Filed Vitals:   07/11/12 1145 07/11/12 1150 07/11/12 1157 07/11/12 1200  BP: 123/68 128/75  135/62  Pulse:      Temp:   98 F (36.7 C)   TempSrc:   Oral   Resp: 18 17  20   Height:      Weight:      SpO2: 96% 95%  89%   CBG (last 3)   Recent Labs  07/08/12 1321 07/08/12 1957  GLUCAP 89 93    IV Fluid Intake:   . sodium chloride    . sodium chloride      MEDICATIONS  . [facility] HOLD] aspirin  325 mg Oral Daily  . [MAR HOLD] budesonide-formoterol  2 puff Inhalation BID  . [facility] HOLD] carvedilol  12.5 mg Oral BID WC  . [facility] HOLD] ferrous sulfate  325 mg Oral Q breakfast  . [MAR HOLD] furosemide  40 mg Oral BID  . [facility] HOLD] gabapentin  300 mg Oral QHS  . [MAR HOLD]  heparin  5,000 Units Subcutaneous Q8H  . [facility] HOLD] levothyroxine  200 mcg Oral QAC breakfast  . [MAR HOLD] lisinopril  10 mg Oral Daily  . [facility] HOLD] loratadine  10 mg Oral Daily  . [facility] HOLD] simvastatin  20 mg Oral q1800  . [MAR HOLD] sodium chloride  3 mL Intravenous Q12H   PRN:  [MAR HOLD] acetaminophen, [MAR HOLD] albuterol, [MAR HOLD] sodium chloride  Diet:  NPO thin liquids Activity:   Bathroom privileges with assistance DVT Prophylaxis:  Heparin 5000 units sq tid   CLINICALLY SIGNIFICANT STUDIES Basic Metabolic Panel:   Recent Labs Lab 07/09/12 0655 07/10/12 0615  NA 139 139  K 3.9 4.0  CL 106 104  CO2 25 25  GLUCOSE 102* 93  BUN 10 9  CREATININE 0.68 0.66  CALCIUM 9.2 9.3   Liver Function Tests: No results found for this basename: AST, ALT, ALKPHOS, BILITOT, PROT, ALBUMIN,  in the last 168 hours CBC:   Recent Labs Lab 07/08/12 1341 07/09/12 0655  WBC  --  7.8  HGB 14.3 13.7  HCT 42.0 41.0  MCV  --  87.2  PLT  --  203   Coagulation: No results found for this basename: LABPROT, INR,  in the last 168 hours Cardiac Enzymes:   Recent Labs Lab 07/09/12 1727 07/09/12 2340 07/10/12  0615  TROPONINI <0.30 <0.30 <0.30   Urinalysis: No results found for this basename: COLORURINE, APPERANCEUR, LABSPEC, PHURINE, GLUCOSEU, HGBUR, BILIRUBINUR, KETONESUR, PROTEINUR, UROBILINOGEN, NITRITE, LEUKOCYTESUR,  in the last 168 hours Lipid Panel    Component Value Date/Time   CHOL 173 07/09/2012 0120   TRIG 159* 07/09/2012 0120   HDL 32* 07/09/2012 0120   CHOLHDL 5.4 07/09/2012 0120   VLDL 32 07/09/2012 0120   LDLCALC 109* 07/09/2012 0120   HgbA1C  Lab Results  Component Value Date   HGBA1C 5.6 07/08/2012    Urine Drug Screen:     Component Value Date/Time   LABOPIA NONE DETECTED 07/09/2012 0810   COCAINSCRNUR NONE DETECTED 07/09/2012 0810   LABBENZ NONE DETECTED 07/09/2012 0810   AMPHETMU NONE DETECTED 07/09/2012 0810   THCU NONE DETECTED 07/09/2012 0810   LABBARB NONE  DETECTED 07/09/2012 0810    Alcohol Level: No results found for this basename: ETH,  in the last 168 hours  CT of the brain   07/10/2012 Normal CT of the head without contrast. 07/09/2012 Normal unenhanced head CT. 07/08/2012  Unremarkable noncontrast head CT.    MRI/A of the brain  pacemaker  2D Echocardiogram  EF 150% with no source of embolus. Diffuse hypokinesis. Mild LVH  Carotid Doppler  No evidence of hemodynamically significant internal carotid artery stenosis. Vertebral artery flow is antegrade.   CXR  07/08/2012   No acute cardiopulmonary abnormality.      EKG  normal sinus rhythm.   EEG this is a normal awake and asleep EEG  Therapy Recommendations no OT needs, HH vs OP PT  Physical Exam   Obese middle aged Caucasian lady not in distress.Awake alert. Afebrile. Head is nontraumatic. Neck is supple without bruit. Hearing is normal. Cardiac exam no murmur or gallop. Lungs are clear to auscultation. Distal pulses are well felt. Neurological Exam : Awake alert oriented x 3 normal speech and language. No face asymmetry. Tongue midline. No drift. Mild diminished fine finger movements on left. Orbits right over left upper extremity. Mild left grip weak.. Normal sensation . Normal coordination.  ASSESSMENT Melanie Cordova is a 52 y.o. female presenting with slurred speech, left face droopiness, and left arm and leg weakness. Initial imaging unrevealing. Unable to get MRI due to pacer. Repeat CTs negative. Dx: Right brain TIA. TEE negative for embolic source. On no antiplatelets prior to admission. Now on aspirin 325 mg orally every day for secondary stroke prevention. Patient with essentially no resultant neuro symptoms. Work up underway.   Chest discomfort 4/28 not felt to be angina per cardiology consult (Rothbart). Medical therapy recommended to prevent decompensation of CHF  Hypertension  Chronic systolic heart failure with ICD  Nonischemic Cardiomyopathy, EF  15% Hyperlipidemia, LDL 109, on statin PTA, on statin now, goal LDL < 100  Morbid obesity, Body mass index is 44.01 kg/(m^2). last BMI 44.7  Cigarette smoker, 0.5-1 PPD  COPD  Hospital day # 3  TREATMENT/PLAN  Continue aspirin 325 mg orally every day for secondary stroke prevention. Recommend labs to look for hypercoagulable source given age < 55:  Hypercoagulable panel (except Factor V Leiden & Beta-2-glycoprotein, which are both associated with venous not arterial infarcts), Vasculitic labs (C3, C4, CH50, ESR, ANA) HIV & RPR. Can be done as an IP and he will follow up as an OP HH vs OP PT Patient has a 10-15% risk of having another stroke over the next year, the highest risk is within 2 weeks of the  most recent stroke/TIA (risk of having a stroke following a stroke or TIA is the same). Ongoing risk factor control by Primary Care Physician Stroke Service will sign off. Please call should any needs arise. Follow up with Dr. Pearlean Brownie, Stroke Clinic, in 2 months.  Annie Main, MSN, RN, ANVP-BC, ANP-BC, [facility] Pager: [phone] 07/11/2012 12:14 PM  I have personally obtained a history, examined the patient, evaluated imaging results, and formulated the assessment and plan of care. I agree with the above. Delia Heady, MD

## 2012-07-12 ENCOUNTER — Encounter (HOSPITAL_COMMUNITY): Payer: Self-pay | Admitting: Cardiovascular Disease

## 2012-07-12 DIAGNOSIS — G459 Transient cerebral ischemic attack, unspecified: Principal | ICD-10-CM

## 2012-07-12 MED ORDER — FUROSEMIDE 40 MG PO TABS: 40.0000 mg | ORAL_TABLET | Freq: Two times a day (BID) | ORAL | Status: DC

## 2012-07-12 MED ORDER — ASPIRIN 325 MG PO TABS: 325.0000 mg | ORAL_TABLET | Freq: Every day | ORAL | Status: DC

## 2012-07-12 MED ORDER — LORATADINE 10 MG PO TABS: 10.0000 mg | ORAL_TABLET | Freq: Every day | ORAL | Status: DC

## 2012-07-12 MED ORDER — ALBUTEROL SULFATE HFA 108 (90 BASE) MCG/ACT IN AERS: 2.0000 | INHALATION_SPRAY | Freq: Four times a day (QID) | RESPIRATORY_TRACT | Status: AC | PRN

## 2012-07-12 MED ORDER — CARVEDILOL 12.5 MG PO TABS: 12.5000 mg | ORAL_TABLET | Freq: Two times a day (BID) | ORAL | Status: DC

## 2012-07-12 MED ORDER — BUDESONIDE-FORMOTEROL FUMARATE 80-4.5 MCG/ACT IN AERO: 2.0000 | INHALATION_SPRAY | Freq: Two times a day (BID) | RESPIRATORY_TRACT | Status: DC

## 2012-07-12 MED ORDER — LISINOPRIL 10 MG PO TABS: 10.0000 mg | ORAL_TABLET | Freq: Every day | ORAL | Status: DC

## 2012-07-12 NOTE — Clinical Social Work Note (Signed)
Clinical Social Work   Pt is ready for discharge today. Pt has arranged a ride and has supportive friends to assist her. Pt did not express any further needs. CSW is signing off as no further needs identified.   Dede Query, MSW, LCSW [phone]
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
## 2012-07-12 NOTE — Progress Notes (Signed)
Pt has been walking and doing well. About to d/c. Discussed with pt HF booklet, low sodium diet, daily wts. Voiced understanding, stating she has known all this for years. [phone] Ethelda Chick CES, ACSM 11:37 AM 07/12/2012

## 2012-07-12 NOTE — Care Management Note (Addendum)
    Page 1 of 1   07/12/2012     11:50:45 AM   CARE MANAGEMENT NOTE 07/12/2012  Patient:  CANDID, BOVEY   Account Number:  [id number]  Date Initiated:  07/12/2012  Documentation initiated by:  [facility]  Subjective/Objective Assessment:   admitted with lt sided weakness, slurred speech     Action/Plan:   PT/OT evals- recpommended outpatient   Anticipated DC Date:  07/12/2012   Anticipated DC Plan:  HOME/SELF CARE  In-house referral  Clinical Social Worker      DC Associate Professor  CM consult  Outpatient Services - Pt will follow up      Choice offered to / List presented to:             Status of service:  Completed, signed off Medicare Important Message given?   (If response is "NO", the following Medicare IM given date fields will be blank) Date Medicare IM given:   Date Additional Medicare IM given:    Discharge Disposition:  HOME/SELF CARE  Per UR Regulation:  Reviewed for med. necessity/level of care/duration of stay  If discussed at Long Length of Stay Meetings, dates discussed:    Comments:  07/12/12 MD gave patient rx for outpatient PT. Patient's case worker Whitman Hero will be following up with [facility] outpatient to make an appt. Peterson Ao the phone #. Jacquelynn Cree RN,BSN, CCM

## 2012-07-12 NOTE — Progress Notes (Signed)
Pt discharged home for out patient therapy. Accompanied by residence care coordinator. avs and discharge/follow up instruction given to patient . Smoking cessation and stroke education  reinforced. Pt verbalized good understanding , Saline lock removed and intact . Condition at discharge  Is stable. Azzie Roup RN

## 2012-07-12 NOTE — Discharge Summary (Signed)
I saw Melanie Cordova on day of discharge and agree with above formulated note.   It is unclear if Melanie Cordova is suffering from TIA or pseudoseizure.  Hypercoaguable workup (as noted in Dr. Gordy Councilman note) should be initiated by her PCP and followed by Neurology.

## 2012-07-12 NOTE — Progress Notes (Addendum)
The patient is doing well.  She denies any chest discomfort or dyspnea at this time.  She has had no further TIA symptoms.  Her TEE yesterday did not show any left atrial thrombus or source of embolus.  Today physical examination her lungs and her heart reveals a regular rhythm on bedside examination.No murmur gallop or rub.  Agree with plans for discharge home today and she has an appointment outpatient followup with Dr. Dietrich Pates in about two weeks.
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
## 2012-07-23 ENCOUNTER — Ambulatory Visit (HOSPITAL_COMMUNITY)
Admission: RE | Admit: 2012-07-23 | Discharge: 2012-07-23 | Disposition: A | Discharge status: Home / Self Care | Payer: Medicaid Other | Source: Ambulatory Visit | Attending: Internal Medicine | Admitting: Internal Medicine

## 2012-07-23 DIAGNOSIS — IMO0001 Reserved for inherently not codable concepts without codable children: Secondary | ICD-10-CM | POA: Insufficient documentation

## 2012-07-23 DIAGNOSIS — R269 Unspecified abnormalities of gait and mobility: Secondary | ICD-10-CM | POA: Insufficient documentation

## 2012-07-23 DIAGNOSIS — R262 Difficulty in walking, not elsewhere classified: Secondary | ICD-10-CM | POA: Insufficient documentation

## 2012-07-23 DIAGNOSIS — R29898 Other symptoms and signs involving the musculoskeletal system: Secondary | ICD-10-CM | POA: Insufficient documentation

## 2012-07-23 DIAGNOSIS — M6281 Muscle weakness (generalized): Secondary | ICD-10-CM | POA: Insufficient documentation

## 2012-07-23 NOTE — Evaluation (Signed)
Physical Therapy Evaluation  Patient Details  Name: Melanie Cordova MRN: [id number] Date of Birth: 09/08/1960 Charge:   eval Today's Date: 07/23/2012 Time: 0910-0950 PT Time Calculation (min): 40 min              Visit#: 1 of 12  Re-eval: 08/22/12    Authorization: medicaid    Authorization Time Period:    Authorization Visit#:   of     Past Medical History:  Past Medical History  Diagnosis Date  . Hypertension     09/2010-normal CMet and CBC; Lipid profile-116, 88, 25, 73  . Cardiomyopathy 08/2010    Presented with congestive heart failure; EF of 15% and 2012; hypotension on medication precludes optimal dosing  . Gastroesophageal reflux disease   . Hypothyroidism     Recent TSH was normal.  . Pneumonia   . Obesity   . Hyperlipidemia   . Tobacco abuse     20 pack years  . CHF (congestive heart failure)   . Asthma   . Shortness of breath   . Seizures   . Headache   . Arthritis   . Anxiety   . Neuropathy   . 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 Chronic systolic heart failure    Past Surgical History:  Past Surgical History  Procedure Laterality Date  . Cesarean section      X2  . Tee without cardioversion N/A 07/11/2012    Procedure: TRANSESOPHAGEAL ECHOCARDIOGRAM (TEE);  Surgeon: Vesta Mixer, MD;  [facility];  Service: Cardiovascular;  Laterality: N/A;    Subjective Symptoms/Limitations Symptoms: [name] states that she hopes to get more of her strength back.  She states that she has COPD and is only able to do a little at a time.  She states that she has noticed increased weakness in her L hand and she becomes tired much quicker than she had.  She is needing to take several breaks to do her housework.  She is using a chair in the kitchen to cook or to do her dishes because she can not stand for prolong periods.  She wants to get back to doing her housework and cooking without having to take so many breaks.  She states  she does not walk well on inclines or going down inclines  at all.   Pertinent History: [name] is a 52 yo obese female who was admitted to the hospital on 07/08/2012 for what appeared to be either a TIA or a pseudo seizure event.  The pt was discharged on 07/12/2012 with recommendation for OP PT to improve the patients balance.  S How long can you sit comfortably?: no problem How long can you stand comfortably?: less than five minutes. How long can you walk comfortably?: less than three minutes  Pain Assessment Currently in Pain?: No/denies   Balance Screening  no falls but several loss of balance   Assessment LUE Strength Left Shoulder Flexion: 3+/5 Left Shoulder Extension: 4/5 Left Shoulder ABduction: 4/5 Left Shoulder Internal Rotation: 4/5 Left Shoulder External Rotation: 4/5 Left Elbow Flexion:  (4+/5) Left Elbow Extension: 4/5 Grip (lbs): 38 (R 70#) LLE Strength Left Hip Flexion: 3-/5 Left Hip Extension: 3-/5 Left Hip ABduction: 3/5 Left Knee Flexion: 3/5 Left Knee Extension:  (4-/5) Left Ankle Dorsiflexion: 3/5  Exercise/Treatments   Seated Long Arc Quad: Left;5 reps Other Seated Knee Exercises: shld flexion, abduction x 3 Supine Straight Leg Raises: 5 reps Sidelying Hip ABduction: 5 reps Prone  Hamstring Curl: 5 reps Hip Extension: 5 reps    Physical Therapy Assessment and Plan PT Assessment and Plan Clinical Impression Statement: Pt is a 52 yo female who was hospitalized for slurred speed and L sided weakness.  Unable to perform MRI to rule our TIA due to ICD.  Pt continues to have weakness on L side and balance deficits and would benefit from OP PT to maximize her functional capacity and independence. Pt will benefit from skilled therapeutic intervention in order to improve on the following deficits: Decreased activity tolerance;Decreased balance;Decreased strength;Difficulty walking;Obesity Rehab Potential: Good PT Frequency: Min 3X/week PT Duration: 4  weeks PT Treatment/Interventions: Gait training;Functional mobility training;Therapeutic activities;Therapeutic exercise;Balance training;Neuromuscular re-education PT Plan: complete Berg-began at eval but PT C/O chest pain and SOB;  Pt O2 97; pulse 87; BP wnl .  Pt will most likely need several rest breaks as she has COPD and is a smoker.  Encourage pt to quit smoking, complete Nu-Step, heel raises, functional squats, step ups and progress to balance activities.    Goals Home Exercise Program Pt will Perform Home Exercise Program: Independently PT Short Term Goals Time to Complete Short Term Goals: 2 weeks PT Short Term Goal 1: Pt to be able to stand for 10 minutes to be able to do dishes without sitting PT Short Term Goal 2: Pt to be able to walk for 10 minutes without stopping to be able to go into store for 1-3 items PT Long Term Goals Time to Complete Long Term Goals: 4 weeks PT Long Term Goal 1: Pt to be able to stand for 15 minutes to make a small meal PT Long Term Goal 2: Pt to be able to walk for 15-20 minutes for exercise for good health Long Term Goal 3: Pt to be able to grasp item without dropping them. Long Term Goal 4: Berg to be improved 10 pts to reduce risk of falling  Problem List Patient Active Problem List   Diagnosis Date Noted  . Difficulty in walking 07/23/2012  . Left leg weakness 07/23/2012  . Left arm weakness 07/23/2012  . TIA (transient ischemic attack) 11/07/2011  . 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 Hypokalemia 10/01/2011  . Insomnia 12/03/2010  . Tobacco abuse   . Hyperlipidemia   . Hypertension   . Gastroesophageal reflux disease   . Hypothyroidism   . Obesity 09/30/2010  . Cardiomyopathy 08/13/2010    PT - End of Session Equipment Utilized During Treatment: Gait belt Activity Tolerance: Patient limited by fatigue General Behavior During Therapy: [facility] for tasks assessed/performed PT Plan of Care PT Home Exercise Plan: given  GP     RUSSELL,CINDY 07/23/2012, 10:07 AM  Physician Documentation Your signature is required to indicate approval of the treatment plan as stated above.  Please sign and either send electronically or make a copy of this report for your files and return this physician signed original.   Please mark one 1.__approve of plan  2. ___approve of plan with the following conditions.   ______________________________                                                          _____________________ Physician Signature  Date  

## 2012-07-25 ENCOUNTER — Ambulatory Visit (HOSPITAL_COMMUNITY)
Admission: RE | Admit: 2012-07-25 | Discharge: 2012-07-25 | Disposition: A | Discharge status: Home / Self Care | Payer: Medicaid Other | Source: Ambulatory Visit | Attending: Internal Medicine | Admitting: Internal Medicine

## 2012-07-25 ENCOUNTER — Encounter: Payer: Self-pay | Admitting: Cardiology

## 2012-07-25 ENCOUNTER — Ambulatory Visit (INDEPENDENT_AMBULATORY_CARE_PROVIDER_SITE_OTHER): Payer: Medicaid Other | Admitting: Cardiology

## 2012-07-25 VITALS — BP 92/56 | HR 73 | Ht 67.5 in | Wt 274.0 lb

## 2012-07-25 DIAGNOSIS — I428 Other cardiomyopathies: Secondary | ICD-10-CM

## 2012-07-25 DIAGNOSIS — I429 Cardiomyopathy, unspecified: Secondary | ICD-10-CM

## 2012-07-25 DIAGNOSIS — E785 Hyperlipidemia, unspecified: Secondary | ICD-10-CM

## 2012-07-25 DIAGNOSIS — E876 Hypokalemia: Secondary | ICD-10-CM

## 2012-07-25 DIAGNOSIS — K056 Periodontal disease, unspecified: Secondary | ICD-10-CM

## 2012-07-25 DIAGNOSIS — I10 Essential (primary) hypertension: Secondary | ICD-10-CM

## 2012-07-25 DIAGNOSIS — K069 Disorder of gingiva and edentulous alveolar ridge, unspecified: Secondary | ICD-10-CM | POA: Insufficient documentation

## 2012-07-25 NOTE — Assessment & Plan Note (Signed)
Extraction of all remaining teeth is planned. Although patient has significant health challenges, which will somewhat increase the risk of any procedure, she is stable with respect to cardiac disease and is unlikely to suffer a significant complication.

## 2012-07-25 NOTE — Assessment & Plan Note (Signed)
Most recent lipid profile is substantially improved with lower triglycerides, which permits calculation of LDL level. Although this is not optimal, in the absence of known vascular disease, pharmacologic therapy is not necessarily warranted.

## 2012-07-25 NOTE — Progress Notes (Signed)
Patient ID: Melanie Cordova, female   DOB: 03-16-1960, 52 y.o.   MRN: [id number]  HPI: Schedule return visit for this very nice woman with presumed nonischemic cardiomyopathy. She was recently admitted to [facility] with neurologic symptoms thought to represent a TIA. There was a significant psychologic overlay involving difficulties in her relationship with her boyfriend. She continues to be followed by [facility], but the hypothesized mechanism for her event is unclear to me.  She has been started on no new antithrombotic therapy.  Dr. Barbette Merino plans to extract all of the patient's remaining teeth in one sitting.  Current Outpatient Prescriptions  Medication Sig Dispense Refill  . albuterol (PROVENTIL HFA;VENTOLIN HFA) 108 (90 BASE) MCG/ACT inhaler Inhale 2 puffs into the lungs every 6 (six) hours as needed for wheezing or shortness of breath.  1 Inhaler  5  . aspirin 325 MG tablet Take 1 tablet (325 mg total) by mouth daily.  30 tablet  5  . budesonide-formoterol (SYMBICORT) 80-4.5 MCG/ACT inhaler Inhale 2 puffs into the lungs 2 (two) times daily.  1 Inhaler  6  . carvedilol (COREG) 25 MG tablet Take 25 mg by mouth 2 (two) times daily with a meal.      . desloratadine (CLARINEX) 5 MG tablet Take 5 mg by mouth daily.      . diclofenac sodium (VOLTAREN) 1 % GEL Apply 2 g topically 4 (four) times daily.      . ferrous sulfate (IRON SUPPLEMENT) 325 (65 FE) MG tablet Take 325 mg by mouth daily with breakfast.      . fish oil-omega-3 fatty acids 1000 MG capsule Take 1 g by mouth daily.      . furosemide (LASIX) 40 MG tablet Take 1 tablet (40 mg total) by mouth 2 (two) times daily.  60 tablet  6  . gabapentin (NEURONTIN) 300 MG capsule Take 300 mg by mouth at bedtime.       Marland Kitchen levothyroxine (SYNTHROID, LEVOTHROID) 150 MCG tablet Take 300 mcg by mouth daily.      Marland Kitchen lisinopril (PRINIVIL,ZESTRIL) 10 MG tablet Take 1 tablet (10 mg total) by mouth daily.  30 tablet  5  . loratadine (CLARITIN)  10 MG tablet Take 1 tablet (10 mg total) by mouth daily.  30 tablet  2  . lovastatin (MEVACOR) 40 MG tablet Take 1 tablet (40 mg total) by mouth at bedtime.  30 tablet  6  . Multiple Vitamins-Minerals (CENTRUM SILVER ADULT 50+ PO) Take by mouth. DAILY      . potassium chloride SA (K-DUR,KLOR-CON) 20 MEQ tablet Take 2 tablets (40 mEq total) by mouth 2 (two) times daily.  120 tablet  6  . ranitidine (ZANTAC) 150 MG tablet Take 150 mg by mouth 2 (two) times daily as needed. Heart Burn       No current facility-administered medications for this visit.   Allergies  Allergen Reactions  . Fish Allergy Anaphylaxis  . Lentil Anaphylaxis  . Penicillins Anaphylaxis and Shortness Of Breath    Hair loss  . Iodinated Diagnostic Agents Hives and Other (See Comments)    Pulmonary problems; no frank respiratory arrest  . Iodine Hives    Pulmonary Problems   . Sulfa Antibiotics Other (See Comments)    Unknown  . Spironolactone Rash     Past medical history, social history, and family history reviewed and updated.  ROS: Denies chest pain, dyspnea, orthopnea or PND. She has had no significant peripheral edema. She does her  housework without difficulty and notes that exercise tolerance is as good as it has been for some time. She reports dizziness when she overexerts, but no orthostatic symptoms.  All other systems reviewed and are negative.  PHYSICAL EXAM: BP 92/56  Pulse 73  Ht 5' 7.5" (1.715 m)  Wt 124.286 kg (274 lb)  BMI 42.26 kg/m2  SpO2 98%;  Body mass index is 42.26 kg/(m^2). General-Well developed; no acute distress; teeth in poor repair. Body habitus-proportionate weight and height Neck-No JVD; no carotid bruits Lungs-clear lung fields; resonant to percussion Cardiovascular-normal PMI; normal S1 and S2 Abdomen-normal bowel sounds; soft and non-tender without masses or organomegaly Musculoskeletal-No deformities, no cyanosis or clubbing Neurologic-Normal cranial nerves; symmetric  strength and tone Skin-Warm, no significant lesions Extremities-distal pulses intact; no edema  McNary Bing, MD 07/25/2012  1:56 PM  ASSESSMENT AND PLAN

## 2012-07-25 NOTE — Progress Notes (Deleted)
Name: Melanie Cordova    DOB: 02-Mar-1961  Age: 52 y.o.  MR#: [id number]       PCP:  [name], NP      Insurance: Payor: MEDICAID [place] / Plan: MEDICAID [employer] ACCESS / Product Type: *No Product type* /   CC:   No chief complaint on file. bottles   VS Filed Vitals:   07/25/12 1300  BP: 92/56  Pulse: 73  Height: 5' 7.5" (1.715 m)  Weight: 274 lb (124.286 kg)  SpO2: 98%    Weights Current Weight  07/25/12 274 lb (124.286 kg)  07/10/12 281 lb 1.4 oz (127.5 kg)  07/10/12 281 lb 1.4 oz (127.5 kg)    Blood Pressure  BP Readings from Last 3 Encounters:  07/25/12 92/56  07/12/12 113/66  07/12/12 113/66     Admit date:  (Not on file) Last encounter with [facility]:  05/14/2012   Allergy Fish allergy; Lentil; Penicillins; Iodinated diagnostic agents; Iodine; Sulfa antibiotics; and Spironolactone  Current Outpatient Prescriptions  Medication Sig Dispense Refill  . albuterol (PROVENTIL HFA;VENTOLIN HFA) 108 (90 BASE) MCG/ACT inhaler Inhale 2 puffs into the lungs every 6 (six) hours as needed for wheezing or shortness of breath.  1 Inhaler  5  . aspirin 325 MG tablet Take 1 tablet (325 mg total) by mouth daily.  30 tablet  5  . budesonide-formoterol (SYMBICORT) 80-4.5 MCG/ACT inhaler Inhale 2 puffs into the lungs 2 (two) times daily.  1 Inhaler  6  . carvedilol (COREG) 25 MG tablet Take 25 mg by mouth 2 (two) times daily with a meal.      . desloratadine (CLARINEX) 5 MG tablet Take 5 mg by mouth daily.      . diclofenac sodium (VOLTAREN) 1 % GEL Apply 2 g topically 4 (four) times daily.      . ferrous sulfate (IRON SUPPLEMENT) 325 (65 FE) MG tablet Take 325 mg by mouth daily with breakfast.      . fish oil-omega-3 fatty acids 1000 MG capsule Take 1 g by mouth daily.      . furosemide (LASIX) 40 MG tablet Take 1 tablet (40 mg total) by mouth 2 (two) times daily.  60 tablet  6  . gabapentin (NEURONTIN) 300 MG capsule Take 300 mg by mouth at bedtime.       Marland Kitchen levothyroxine (SYNTHROID,  LEVOTHROID) 150 MCG tablet Take 300 mcg by mouth daily.      Marland Kitchen lisinopril (PRINIVIL,ZESTRIL) 10 MG tablet Take 1 tablet (10 mg total) by mouth daily.  30 tablet  5  . loratadine (CLARITIN) 10 MG tablet Take 1 tablet (10 mg total) by mouth daily.  30 tablet  2  . lovastatin (MEVACOR) 40 MG tablet Take 1 tablet (40 mg total) by mouth at bedtime.  30 tablet  6  . Multiple Vitamins-Minerals (CENTRUM SILVER ADULT 50+ PO) Take by mouth. DAILY      . potassium chloride SA (K-DUR,KLOR-CON) 20 MEQ tablet Take 2 tablets (40 mEq total) by mouth 2 (two) times daily.  120 tablet  6  . ranitidine (ZANTAC) 150 MG tablet Take 150 mg by mouth 2 (two) times daily as needed. Heart Burn       No current facility-administered medications for this visit.    Discontinued Meds:    Medications Discontinued During This Encounter  Medication Reason  . carvedilol (COREG) 12.5 MG tablet Error  . acetaminophen (TYLENOL) 500 MG tablet Error  . budesonide-formoterol (SYMBICORT) 80-4.5 MCG/ACT inhaler Error  .  albuterol (PROVENTIL HFA;VENTOLIN HFA) 108 (90 BASE) MCG/ACT inhaler Error    Patient Active Problem List   Diagnosis Date Noted  . Difficulty in walking 07/23/2012  . Left leg weakness 07/23/2012  . Left arm weakness 07/23/2012  . TIA (transient ischemic attack) 11/07/2011  . 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 Hypokalemia 10/01/2011  . Insomnia 12/03/2010  . Tobacco abuse   . Hyperlipidemia   . Hypertension   . Gastroesophageal reflux disease   . Hypothyroidism   . Obesity 09/30/2010  . Cardiomyopathy 08/13/2010    LABS    Component Value Date/Time   NA 139 07/10/2012 0615   NA 139 07/09/2012 0655   NA 141 07/08/2012 1341   K 4.0 07/10/2012 0615   K 3.9 07/09/2012 0655   K 4.6 07/08/2012 1341   CL 104 07/10/2012 0615   CL 106 07/09/2012 0655   CL 108 07/08/2012 1341   CO2 25 07/10/2012 0615   CO2 25 07/09/2012 0655   CO2 27 05/11/2012 2019   GLUCOSE 93 07/10/2012 0615   GLUCOSE 102* 07/09/2012 0655   GLUCOSE  89 07/08/2012 1341   BUN 9 07/10/2012 0615   BUN 10 07/09/2012 0655   BUN 12 07/08/2012 1341   CREATININE 0.66 07/10/2012 0615   CREATININE 0.68 07/09/2012 0655   CREATININE 0.70 07/08/2012 1341   CREATININE 0.92 02/22/2012 0834   CREATININE 1.16* 12/30/2011 0825   CREATININE 0.80 10/06/2011 1115   CALCIUM 9.3 07/10/2012 0615   CALCIUM 9.2 07/09/2012 0655   CALCIUM 9.7 05/11/2012 2019   GFRNONAA >90 07/10/2012 0615   GFRNONAA >90 07/09/2012 0655   GFRNONAA >90 05/11/2012 2019   GFRAA >90 07/10/2012 0615   GFRAA >90 07/09/2012 0655   GFRAA >90 05/11/2012 2019   CMP     Component Value Date/Time   NA 139 07/10/2012 0615   K 4.0 07/10/2012 0615   CL 104 07/10/2012 0615   CO2 25 07/10/2012 0615   GLUCOSE 93 07/10/2012 0615   BUN 9 07/10/2012 0615   CREATININE 0.66 07/10/2012 0615   CREATININE 0.92 02/22/2012 0834   CALCIUM 9.3 07/10/2012 0615   PROT 6.0 11/07/2011 0328   ALBUMIN 3.1* 11/07/2011 0328   AST 15 11/07/2011 0328   ALT 16 11/07/2011 0328   ALKPHOS 87 11/07/2011 0328   BILITOT 0.4 11/07/2011 0328   GFRNONAA >90 07/10/2012 0615   GFRAA >90 07/10/2012 0615       Component Value Date/Time   WBC 7.8 07/09/2012 0655   WBC 10.5 05/11/2012 2019   WBC 8.0 11/07/2011 0328   HGB 13.7 07/09/2012 0655   HGB 14.3 07/08/2012 1341   HGB 14.1 05/11/2012 2019   HCT 41.0 07/09/2012 0655   HCT 42.0 07/08/2012 1341   HCT 42.4 05/11/2012 2019   MCV 87.2 07/09/2012 0655   MCV 92.6 05/11/2012 2019   MCV 92.5 11/07/2011 0328    Lipid Panel     Component Value Date/Time   CHOL 173 07/09/2012 0120   TRIG 159* 07/09/2012 0120   HDL 32* 07/09/2012 0120   CHOLHDL 5.4 07/09/2012 0120   VLDL 32 07/09/2012 0120   LDLCALC 109* 07/09/2012 0120    ABG    Component Value Date/Time   TCO2 25 07/08/2012 1341     Lab Results  Component Value Date   TSH 0.070* 07/08/2012   BNP (last 3 results) No results found for this basename: PROBNP,  in the last 8760 hours Cardiac Panel (last 3 results) No results found for  this  basename: CKTOTAL, CKMB, TROPONINI, RELINDX,  in the last 72 hours  Iron/TIBC/Ferritin    Component Value Date/Time   FERRITIN 81 04/25/2011 0845     EKG Orders placed during the hospital encounter of 07/08/12  . EKG 12-LEAD  . EKG 12-LEAD  . EKG  . EKG 12-LEAD  . EKG 12-LEAD  . EKG 12-LEAD  . EKG 12-LEAD  . EKG 12-LEAD  . EKG 12-LEAD     Prior Assessment and Plan Problem List as of 07/25/2012   Obesity   Last Assessment & Plan   01/25/2012 Office Visit Written 01/25/2012 12:27 PM by Kathlen Brunswick, MD     Weight increased 15 pounds in recent months.  Patient attributes this to hypothyroidism.    Hypertension   Last Assessment & Plan   01/25/2012 Office Visit Written 01/25/2012 12:25 PM by Kathlen Brunswick, MD     Blood pressure remains on the low side with medical therapy for cardiomyopathy and congestive heart failure.    Cardiomyopathy   Last Assessment & Plan   01/25/2012 Office Visit Edited 01/26/2012 10:26 AM by Kathlen Brunswick, MD     Patient is virtually asymptomatic despite severely impaired ventricular systolic function; chronic systolic CHF is well compensated.  Treatment with Aldactone would be desirable, but patient has apparently developed a rash with this medication in the past.  The cost of eplerenone is prohibitive for this nice woman, but we could attempt to obtain a supply for her via the health department.    Gastroesophageal reflux disease   Hypothyroidism   Last Assessment & Plan   01/25/2012 Office Visit Written 01/25/2012 12:25 PM by Kathlen Brunswick, MD     Patient is likely hypothyroid once again.  We have arranged with [facility] to resume thyroid replacement.    Tobacco abuse   Last Assessment & Plan   08/01/2011 Office Visit Written 08/01/2011  6:48 PM by Kathlen Brunswick, MD     Complete cessation of smoking advised.    Hyperlipidemia   Last Assessment & Plan   01/25/2012 Office Visit Written 01/25/2012 12:24 PM by Kathlen Brunswick, MD     Lipids are once again markedly elevated.  Statin therapy in the form of rosuvastatin 40 mg per day will be reinitiated with a repeat lipid profile to be obtained in one month.    Insomnia   Last Assessment & Plan   01/25/2012 Office Visit Written 01/26/2012 10:27 AM by Kathlen Brunswick, MD     Patient was encouraged to sleep only at night and to avoid naps during the day, especially the 2-3 hour sojourns that she has been taking.  We will provide her with short acting hypnotics to try to reset her sleep schedule.  She does not snore and does not have known sleep apnea although she has never undergone a sleep study.  This might be a useful test in the future.    Hypokalemia   Last Assessment & Plan   01/25/2012 Office Visit Written 01/25/2012 12:25 PM by Kathlen Brunswick, MD     We will continue to monitor electrolytes and to adjust medical therapy.    ICD -St.Jude   Last Assessment & Plan   01/16/2012 Office Visit Written 01/16/2012  9:03 AM by Marinus Maw, MD     Her device is working normally. We'll plan to recheck in several months.    TIA (transient ischemic attack)   Difficulty in walking  Left leg weakness   Left arm weakness       Imaging: Ct Head Wo Contrast  07/10/2012   *RADIOLOGY REPORT*  Clinical Data:  Stroke.  Lightheaded.  CT HEAD WITHOUT CONTRAST  Technique:  Contiguous axial images were obtained from the base of the skull through the vertex without contrast  Comparison:  CT head 07/09/2012  Findings:  The brain has a normal appearance without evidence for hemorrhage, acute infarction, hydrocephalus, or mass lesion.  There is no extra axial fluid collection.  The skull and paranasal sinuses are normal.  IMPRESSION: Normal CT of the head without contrast.   Original Report Authenticated By: Janeece Riggers, M.D.   Ct Head Without Contrast  07/09/2012   *RADIOLOGY REPORT*  Clinical Data: The patient came to the Hospital yesterday with stroke-like  symptoms with head pain and trouble speaking and left leg dragging and left arm weakness.  Improve this morning.  CT HEAD WITHOUT CONTRAST  Technique:  Contiguous axial images were obtained from the base of the skull through the vertex without contrast.  Comparison: 07/08/2012  Findings: No change from the prior study.  The ventricles are normal in size and configuration.  There are no parenchymal masses mass effect, no areas of abnormal parenchymal attenuation and no extra-axial masses or abnormal fluid collections.  There is no evidence of a recent infarct and there is no intracranial hemorrhage.  The sinuses and mastoid air cells are clear.  IMPRESSION: Normal unenhanced head CT.   Original Report Authenticated By: Amie Portland, M.D.   Ct Head Wo Contrast  07/08/2012   *RADIOLOGY REPORT*  Clinical Data: 52 year old female with left-sided weakness and slurred speech.  CT HEAD WITHOUT CONTRAST  Technique:  Contiguous axial images were obtained from the base of the skull through the vertex without contrast.  Comparison: 05/11/2012 and prior CTs.  Findings: No intracranial abnormalities are identified, including mass lesion or mass effect, hydrocephalus, extra-axial fluid collection, midline shift, hemorrhage, or acute infarction.  The visualized bony calvarium is unremarkable.  IMPRESSION: Unremarkable noncontrast head CT.  Critical Value/emergent results were called by telephone at the time of interpretation on 07/08/2012 at 1:53 p.m. to Dr. Judd Lien, who verbally acknowledged these results.   Original Report Authenticated By: Harmon Pier, M.D.   Dg Chest Portable 1 View  07/08/2012   *RADIOLOGY REPORT*  Clinical Data: Code stroke  PORTABLE CHEST - 1 VIEW  Comparison: 03/20/2012  Findings: Cardiac enlargement without heart failure.  Single lead pacemaker unchanged.  Negative for heart failure.  Negative for mass or pneumonia.  IMPRESSION: No acute cardiopulmonary abnormality.   Original Report Authenticated By:  Janeece Riggers, M.D.

## 2012-07-25 NOTE — Assessment & Plan Note (Signed)
Hypokalemia corrected as of 06/2012.

## 2012-07-25 NOTE — Assessment & Plan Note (Signed)
No recent hypertensive blood pressure determinations. Systolics have intermittently been on the low side as is the case today. Patient has no clear symptoms related to these marginal blood pressures and can continue her usual medications as long as she does not develop symptomatic hypotension.

## 2012-07-25 NOTE — Patient Instructions (Addendum)
Your physician recommends that you schedule a follow-up appointment in: 4 months  Your physician recommends that you return for lab work in: TWO MONTHS The patient's paper medical record is not available during this visit. It has been removed from this office and cannot be located.

## 2012-07-25 NOTE — Progress Notes (Signed)
Physical Therapy Treatment Patient Details  Name: Melanie Cordova MRN: [id number] Date of Birth: Nov 12, 1960  Today's Date: 07/25/2012 Time: 7829-5621 PT Time Calculation (min): 40 min Charge: Therex 18', Physical performance testing x 20'  Visit#: 2 of 12  Re-eval: 08/22/12 Assessment Diagnosis: Generalized weakness Lt side and balance Next MD Visit: MD [place] 3 months  Authorization: medicaid  Authorization Time Period:    Authorization Visit#:   of     Subjective: Symptoms/Limitations Symptoms: Pt reported she has no pain just tired easily today.  Pt has been compliant with HEP for Lt hand strengthening and LE exercises. Pain Assessment Currently in Pain?: No/denies  Objective:   Exercise/Treatments Mobility/Balance     Berg Balance Test Sit to Stand: Able to stand without using hands and stabilize independently Standing Unsupported: Able to stand safely 2 minutes Sitting with Back Unsupported but Feet Supported on Floor or Stool: Able to sit safely and securely 2 minutes Stand to Sit: Sits safely with minimal use of hands Transfers: Able to transfer safely, definite need of hands Standing Unsupported with Eyes Closed: Able to stand 10 seconds safely Standing Ubsupported with Feet Together: Able to place feet together independently and stand 1 minute safely From Standing, Reach Forward with Outstretched Arm: Can reach forward >12 cm safely (5") From Standing Position, Pick up Object from Floor: Able to pick up shoe safely and easily From Standing Position, Turn to Look Behind Over each Shoulder: Looks behind from both sides and weight shifts well Turn 360 Degrees: Able to turn 360 degrees safely one side only in 4 seconds or less Standing Unsupported, Alternately Place Feet on Step/Stool: Able to stand independently and complete 8 steps >20 seconds Standing Unsupported, One Foot in Front: Able to plae foot ahead of the other independently and hold 30 seconds Standing  on One Leg: Able to lift leg independently and hold 5-10 seconds Total Score: 50  Aerobic Stationary Bike: Nustep x 10 minutes for activity tolerance Standing Heel Raises: 10 reps;Limitations Heel Raises Limitations: toe raises 10 Functional Squat: 10 reps     Physical Therapy Assessment and Plan PT Assessment and Plan Clinical Impression Statement: BERG balance testing complete and therex for LE strengthening.  Pt able to complete exercises with min cueing for technique.  Pt limited by fatigue end of session, no reports of pain.  O2 sat 98% through session and no reports of SOB PT Plan: Continue current POC for strength training, increase activity tolerance and high level balance activtiies.  Begin stair training next session and tandem stance/gait, retro and side stepping, cone rotation, etc....    Goals    Problem List Patient Active Problem List   Diagnosis Date Noted  . Gingival disease 07/25/2012  . TIA (transient ischemic attack) 11/07/2011  . 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 Hypokalemia 10/01/2011  . Tobacco abuse   . Hyperlipidemia   . Hypertension   . Gastroesophageal reflux disease   . Hypothyroidism   . Obesity 09/30/2010  . Cardiomyopathy 08/13/2010    PT - End of Session Equipment Utilized During Treatment: Gait belt Activity Tolerance: Patient limited by fatigue;Patient tolerated treatment well General Behavior During Therapy: [facility] for tasks assessed/performed Cognition: WFL for tasks performed  GP    Juel Burrow 07/25/2012, 3:52 PM

## 2012-07-25 NOTE — Assessment & Plan Note (Signed)
Patient is doing well symptomatically with well compensated congestive heart failure.

## 2012-07-27 ENCOUNTER — Encounter (HOSPITAL_COMMUNITY): Payer: Self-pay

## 2012-07-27 ENCOUNTER — Observation Stay (HOSPITAL_COMMUNITY)
Admission: EM | Admit: 2012-07-27 | Discharge: 2012-07-29 | Disposition: A | Discharge status: Home / Self Care | Payer: Medicaid Other | Attending: Internal Medicine | Admitting: Internal Medicine

## 2012-07-27 ENCOUNTER — Ambulatory Visit (HOSPITAL_COMMUNITY)
Admission: RE | Admit: 2012-07-27 | Discharge: 2012-07-27 | Disposition: A | Discharge status: Home / Self Care | Payer: Medicaid Other | Source: Ambulatory Visit | Attending: Internal Medicine | Admitting: Internal Medicine

## 2012-07-27 ENCOUNTER — Other Ambulatory Visit: Payer: Self-pay

## 2012-07-27 ENCOUNTER — Emergency Department (HOSPITAL_COMMUNITY): Payer: Medicaid Other

## 2012-07-27 DIAGNOSIS — I10 Essential (primary) hypertension: Secondary | ICD-10-CM

## 2012-07-27 DIAGNOSIS — H532 Diplopia: Secondary | ICD-10-CM | POA: Insufficient documentation

## 2012-07-27 DIAGNOSIS — E785 Hyperlipidemia, unspecified: Secondary | ICD-10-CM

## 2012-07-27 DIAGNOSIS — R471 Dysarthria and anarthria: Secondary | ICD-10-CM | POA: Insufficient documentation

## 2012-07-27 DIAGNOSIS — E039 Hypothyroidism, unspecified: Secondary | ICD-10-CM

## 2012-07-27 DIAGNOSIS — F17200 Nicotine dependence, unspecified, uncomplicated: Secondary | ICD-10-CM | POA: Insufficient documentation

## 2012-07-27 DIAGNOSIS — R531 Weakness: Secondary | ICD-10-CM

## 2012-07-27 DIAGNOSIS — R29898 Other symptoms and signs involving the musculoskeletal system: Secondary | ICD-10-CM | POA: Insufficient documentation

## 2012-07-27 DIAGNOSIS — K069 Disorder of gingiva and edentulous alveolar ridge, unspecified: Secondary | ICD-10-CM

## 2012-07-27 DIAGNOSIS — I428 Other cardiomyopathies: Secondary | ICD-10-CM | POA: Insufficient documentation

## 2012-07-27 DIAGNOSIS — E669 Obesity, unspecified: Secondary | ICD-10-CM

## 2012-07-27 DIAGNOSIS — R29810 Facial weakness: Secondary | ICD-10-CM | POA: Insufficient documentation

## 2012-07-27 DIAGNOSIS — G459 Transient cerebral ischemic attack, unspecified: Principal | ICD-10-CM

## 2012-07-27 DIAGNOSIS — R4789 Other speech disturbances: Secondary | ICD-10-CM | POA: Insufficient documentation

## 2012-07-27 DIAGNOSIS — I509 Heart failure, unspecified: Secondary | ICD-10-CM | POA: Insufficient documentation

## 2012-07-27 DIAGNOSIS — I5022 Chronic systolic (congestive) heart failure: Secondary | ICD-10-CM | POA: Insufficient documentation

## 2012-07-27 DIAGNOSIS — K219 Gastro-esophageal reflux disease without esophagitis: Secondary | ICD-10-CM

## 2012-07-27 DIAGNOSIS — I429 Cardiomyopathy, unspecified: Secondary | ICD-10-CM

## 2012-07-27 DIAGNOSIS — R079 Chest pain, unspecified: Secondary | ICD-10-CM | POA: Insufficient documentation

## 2012-07-27 DIAGNOSIS — Z8673 Personal history of transient ischemic attack (TIA), and cerebral infarction without residual deficits: Secondary | ICD-10-CM | POA: Insufficient documentation

## 2012-07-27 DIAGNOSIS — E876 Hypokalemia: Secondary | ICD-10-CM

## 2012-07-27 DIAGNOSIS — Z6841 Body Mass Index (BMI) 40.0 and over, adult: Secondary | ICD-10-CM | POA: Insufficient documentation

## 2012-07-27 DIAGNOSIS — I639 Cerebral infarction, unspecified: Secondary | ICD-10-CM

## 2012-07-27 DIAGNOSIS — Z95810 Presence of automatic (implantable) cardiac defibrillator: Secondary | ICD-10-CM

## 2012-07-27 DIAGNOSIS — Z79899 Other long term (current) drug therapy: Secondary | ICD-10-CM | POA: Insufficient documentation

## 2012-07-27 DIAGNOSIS — Z720 Tobacco use: Secondary | ICD-10-CM | POA: Diagnosis present

## 2012-07-27 LAB — CREATININE, SERUM
Creatinine, Ser: 0.92 mg/dL (ref 0.50–1.10)
GFR calc Af Amer: 83 mL/min — ABNORMAL LOW (ref >90)
GFR calc non Af Amer: 71 mL/min — ABNORMAL LOW (ref >90)

## 2012-07-27 LAB — CBC
HCT: 43.9 % (ref 36.0–46.0)
Hemoglobin: 15.4 g/dL — ABNORMAL HIGH (ref 12.0–15.0)
Hemoglobin: 14.6 g/dL (ref 12.0–15.0)
MCH: 29.9 pg (ref 26.0–34.0)
MCHC: 33.2 g/dL (ref 30.0–36.0)
MCHC: 33.3 g/dL (ref 30.0–36.0)
MCV: 90.1 fL (ref 78.0–100.0)
Platelets: 274 10*3/uL (ref 150–400)
RBC: 4.97 MIL/uL (ref 3.87–5.11)
WBC: 10.4 10*3/uL (ref 4.0–10.5)

## 2012-07-27 LAB — DIFFERENTIAL
Basophils Relative: 0 % (ref 0–1)
Eosinophils Absolute: 0.2 10*3/uL (ref 0.0–0.7)
Eosinophils Relative: 2 % (ref 0–5)
Lymphs Abs: 2.9 10*3/uL (ref 0.7–4.0)
Monocytes Relative: 7 % (ref 3–12)
Neutrophils Relative %: 67 % (ref 43–77)

## 2012-07-27 LAB — COMPREHENSIVE METABOLIC PANEL
Albumin: 3.8 g/dL (ref 3.5–5.2)
Alkaline Phosphatase: 103 U/L (ref 39–117)
BUN: 14 mg/dL (ref 6–23)
Calcium: 9.3 mg/dL (ref 8.4–10.5)
GFR calc Af Amer: 86 mL/min — ABNORMAL LOW (ref >90)
Glucose, Bld: 96 mg/dL (ref 70–99)
Potassium: 4.6 mEq/L (ref 3.5–5.1)
Total Protein: 7.4 g/dL (ref 6.0–8.3)

## 2012-07-27 LAB — TROPONIN I: Troponin I: <0.3 ng/mL (ref <0.30)

## 2012-07-27 LAB — URINALYSIS, ROUTINE W REFLEX MICROSCOPIC
Bilirubin Urine: NEGATIVE
Glucose, UA: NEGATIVE mg/dL
Hgb urine dipstick: NEGATIVE
Ketones, ur: NEGATIVE mg/dL
Leukocytes, UA: NEGATIVE
Protein, ur: NEGATIVE mg/dL
pH: 5.5 (ref 5.0–8.0)

## 2012-07-27 LAB — RAPID URINE DRUG SCREEN, HOSP PERFORMED
Amphetamines: POSITIVE — AB
Benzodiazepines: NOT DETECTED
Cocaine: NOT DETECTED
Opiates: NOT DETECTED

## 2012-07-27 LAB — PROTIME-INR
INR: 0.97 (ref 0.00–1.49)
Prothrombin Time: 12.8 seconds (ref 11.6–15.2)

## 2012-07-27 MED ORDER — ACETAMINOPHEN 325 MG PO TABS: 650.0000 mg | ORAL_TABLET | ORAL | Status: DC | PRN

## 2012-07-27 MED ORDER — FUROSEMIDE 40 MG PO TABS
40.0000 mg | ORAL_TABLET | Freq: Two times a day (BID) | ORAL | Status: DC
  Administered 2012-07-27 – 2012-07-29 (×4): 40 mg via ORAL
  Filled 2012-07-27 (×6): qty 1

## 2012-07-27 MED ORDER — POTASSIUM CHLORIDE CRYS ER 20 MEQ PO TBCR
40.0000 meq | EXTENDED_RELEASE_TABLET | Freq: Two times a day (BID) | ORAL | Status: DC
  Administered 2012-07-27 – 2012-07-29 (×4): 40 meq via ORAL
  Filled 2012-07-27 (×5): qty 2

## 2012-07-27 MED ORDER — ONDANSETRON HCL 4 MG/2ML IJ SOLN: 4.0000 mg | Freq: Four times a day (QID) | INTRAMUSCULAR | Status: DC | PRN

## 2012-07-27 MED ORDER — ENOXAPARIN SODIUM 40 MG/0.4ML ~~LOC~~ SOLN
40.0000 mg | SUBCUTANEOUS | Status: DC
  Administered 2012-07-27 – 2012-07-28 (×2): 40 mg via SUBCUTANEOUS
  Filled 2012-07-27 (×3): qty 0.4

## 2012-07-27 MED ORDER — CARVEDILOL 25 MG PO TABS
25.0000 mg | ORAL_TABLET | Freq: Two times a day (BID) | ORAL | Status: DC
  Administered 2012-07-28 – 2012-07-29 (×3): 25 mg via ORAL
  Filled 2012-07-27 (×5): qty 1

## 2012-07-27 MED ORDER — LEVOTHYROXINE SODIUM 150 MCG PO TABS
300.0000 ug | ORAL_TABLET | Freq: Every day | ORAL | Status: DC
  Administered 2012-07-28 – 2012-07-29 (×2): 300 ug via ORAL
  Filled 2012-07-27 (×3): qty 2

## 2012-07-27 MED ORDER — FERROUS SULFATE 325 (65 FE) MG PO TABS
325.0000 mg | ORAL_TABLET | Freq: Every day | ORAL | Status: DC
  Administered 2012-07-28 – 2012-07-29 (×2): 325 mg via ORAL
  Filled 2012-07-27 (×3): qty 1

## 2012-07-27 MED ORDER — LORATADINE 10 MG PO TABS
10.0000 mg | ORAL_TABLET | Freq: Every day | ORAL | Status: DC
  Administered 2012-07-28 – 2012-07-29 (×2): 10 mg via ORAL
  Filled 2012-07-27 (×2): qty 1

## 2012-07-27 MED ORDER — FAMOTIDINE 20 MG PO TABS
20.0000 mg | ORAL_TABLET | Freq: Two times a day (BID) | ORAL | Status: DC
  Administered 2012-07-27 – 2012-07-29 (×4): 20 mg via ORAL
  Filled 2012-07-27 (×5): qty 1

## 2012-07-27 MED ORDER — ASPIRIN 325 MG PO TABS
325.0000 mg | ORAL_TABLET | Freq: Every day | ORAL | Status: DC
  Administered 2012-07-27: 325 mg via ORAL
  Filled 2012-07-27: qty 1

## 2012-07-27 MED ORDER — GABAPENTIN 300 MG PO CAPS
300.0000 mg | ORAL_CAPSULE | Freq: Every day | ORAL | Status: DC
  Administered 2012-07-27 – 2012-07-28 (×2): 300 mg via ORAL
  Filled 2012-07-27 (×3): qty 1

## 2012-07-27 MED ORDER — ASPIRIN 325 MG PO TABS
325.0000 mg | ORAL_TABLET | Freq: Every day | ORAL | Status: DC
  Administered 2012-07-28 – 2012-07-29 (×2): 325 mg via ORAL
  Filled 2012-07-27 (×2): qty 1

## 2012-07-27 MED ORDER — BUDESONIDE-FORMOTEROL FUMARATE 80-4.5 MCG/ACT IN AERO
2.0000 | INHALATION_SPRAY | Freq: Two times a day (BID) | RESPIRATORY_TRACT | Status: DC
  Administered 2012-07-28 – 2012-07-29 (×4): 2 via RESPIRATORY_TRACT
  Filled 2012-07-27: qty 6.9

## 2012-07-27 MED ORDER — LISINOPRIL 10 MG PO TABS
10.0000 mg | ORAL_TABLET | Freq: Every day | ORAL | Status: DC
  Administered 2012-07-28 – 2012-07-29 (×2): 10 mg via ORAL
  Filled 2012-07-27 (×2): qty 1

## 2012-07-27 MED ORDER — ALBUTEROL SULFATE HFA 108 (90 BASE) MCG/ACT IN AERS
2.0000 | INHALATION_SPRAY | Freq: Four times a day (QID) | RESPIRATORY_TRACT | Status: DC | PRN
  Administered 2012-07-28: 2 via RESPIRATORY_TRACT
  Filled 2012-07-27: qty 6.7

## 2012-07-27 MED ORDER — ACETAMINOPHEN 650 MG RE SUPP: 650.0000 mg | RECTAL | Status: DC | PRN

## 2012-07-27 MED ORDER — SIMVASTATIN 20 MG PO TABS
20.0000 mg | ORAL_TABLET | Freq: Every day | ORAL | Status: DC
  Administered 2012-07-27 – 2012-07-28 (×2): 20 mg via ORAL
  Filled 2012-07-27 (×3): qty 1

## 2012-07-27 NOTE — Progress Notes (Signed)
[facility] hospitalist progress note. Chief complaint. Transfer note. History of present illness. This 52 year old female presented today [facility] with left-sided weakness and dysarthria. Neurology was contacted and they recommended moving the patient to [facility]. Patient has now arrived in seeing the patient at bedside to ensure continued stability and that her orders transferred appropriately. Patient states her left-sided weakness is resolved but speech still dysarthric though improved. Vital signs. Temperature 98.6, pulse 72, respiration 18, blood pressure 107/67. O2 sats 100%. General appearance. Well-developed middle-aged female who is alert and in no distress. Cardiac. Rate and rhythm regular. Lungs. Breath sounds are clear. Abdomen. Soft with positive bowel sounds. No pain. Neurologic. Speech she continues to be somewhat dysarthric and slurred. No focal weakness. Impression/plan. Problem #1 dysarthria. Rule out CVA or TIA. Neurology will follow post transfer with further recommendations for stroke workup. Patient appears clinically stable at this time. All orders appear to transferred appropriately.

## 2012-07-27 NOTE — Progress Notes (Signed)
Physical Therapy Treatment Patient Details  Name: Melanie Cordova MRN: [id number] Date of Birth: [birth year]  Today's Date: 07/27/2012 Time: 1000 (started by PTA (RE))-1008 (Followed up and transported to ER by PT  (LM)) PT Time Calculation (min): 8 min NO CHARGE  Re-eval: 08/22/12    Authorization: medicaid  Authorization Time Period:    Authorization Visit#:   of     Subjective: Symptoms/Limitations Symptoms: Pt reports that she has been having slurred speech, sharp pain to the back of the head, tingling in her face.  TOday she reports she is feeling clamy, tingling in face, exhibits diplopia.    Precautions/Restrictions    Physical Therapy Assessment and Plan PT Assessment and Plan Clinical Impression Statement: Pt was on NuStep for 3 minutes and started complaining of subjective symptoms listed above.  She had notable weakness to LUE, difficulty with eye tracking to the Lt side, mild coordination deficits noted on Lt side.  Blood Pressure 94/62, HR: 93, O2Sats: 95%.  Provided pt with a glass of water and cold towel.  Transported pt to the ED and placed in care of the ED.  Signed her AOB allowing for treatment as pt was able to independently verbally consent to treatment. Contacted her Daugther Jeani Hawking about her current location. At this time will hold treatment to this patient untl further orders.  PT Plan: F/u on visit to ED.     Goals    Problem List Patient Active Problem List   Diagnosis Date Noted  . Gingival disease 07/25/2012  . TIA (transient ischemic attack) 11/07/2011  . 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 Hypokalemia 10/01/2011  . Tobacco abuse   . Hyperlipidemia   . Hypertension   . Gastroesophageal reflux disease   . Hypothyroidism   . Obesity 09/30/2010  . Cardiomyopathy 08/13/2010    PT - End of Session Equipment Utilized During Treatment: Gait belt Activity Tolerance: Patient limited by fatigue;Patient tolerated treatment well General Behavior During  Therapy: [facility] for tasks assessed/performed Cognition: [facility] for tasks performed  GP    Jamisyn Langer, MPT, ATC  07/27/2012, 10:28 AM

## 2012-07-27 NOTE — H&P (Signed)
[facility] History and Physical  BAMBI FEHNEL ZOX:[id number] DOB: 12/15/1960 DOA: 07/27/2012  Referring physician:  PCP: [name], NP  Specialists:   Chief Complaint:   HPI: Melanie Cordova is a very pleasant  52 y.o. female with multiple medical problems including CAD s/p defib, cardiomyopathy, obesity, recent presumed stroke with residual left sided weakness presents to ED from OP rehab at [facility] with cc worsening left sided weakness. Information obtained from pt. She reports riding stationary bike and developing sudden pain in back of head with blurred vision and numbness in her throat and left hand and left leg. Associated symptoms include tingling of left jaw and numbness left side of face. In addition she developed slurred speech. She reports sensation of a "hand on my chest". Denies sob, diaphoresis, nausea. Denies difficulty swallowing. Of note pt discharged from hospital 2 weeks ago with TIA. In ED CT yields no acute findings and chest xray without acute abnormality. Unable to get MRI due to ICD. [facility] asked to admit   Review of Systems: The patient denies anorexia, fever, weight loss,, decreased hearing, hoarseness,  syncope, dyspnea on exertion, peripheral edema, balance deficits, hemoptysis, abdominal pain, melena, hematochezia, hematuria, incontinence, genital sores, muscle weakness, suspicious skin lesions, transient blindness, depression, unusual weight change, abnormal bleeding, enlarged lymph nodes, angioedema, and breast masses.    Past Medical History  Diagnosis Date  . Hypertension     09/2010-normal CMet and CBC; Lipid profile-116, 88, 25, 73  . Cardiomyopathy 08/2010    Presented with congestive heart failure; EF of 15% and 2012; hypotension on medication precludes optimal dosing  . Gastroesophageal reflux disease   . Hypothyroidism     Recent TSH was normal.  . Pneumonia   . Obesity   . Hyperlipidemia   . Tobacco abuse     20 pack years  . CHF  (congestive heart failure)   . Asthma   . Shortness of breath   . Seizures   . Headache   . Arthritis   . Anxiety   . Neuropathy   . 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 Chronic systolic heart failure    Past Surgical History  Procedure Laterality Date  . Cesarean section      X2  . Tee without cardioversion N/A 07/11/2012    Procedure: TRANSESOPHAGEAL ECHOCARDIOGRAM (TEE);  Surgeon: Vesta Mixer, MD;  Location: [facility] ENDOSCOPY;  Service: Cardiovascular;  Laterality: N/A;   Social History:  reports that she has been smoking Cigarettes.  She has a 10.5 pack-year smoking history. She has never used smokeless tobacco. She reports that she does not drink alcohol or use illicit drugs. Pt lives alone is on disability. Independent with ADL's Allergies  Allergen Reactions  . Fish Allergy Anaphylaxis  . Lentil Anaphylaxis  . Penicillins Anaphylaxis and Shortness Of Breath    Hair loss  . Iodinated Diagnostic Agents Hives and Other (See Comments)    Pulmonary problems; no frank respiratory arrest  . Iodine Hives    Pulmonary Problems   . Sulfa Antibiotics Other (See Comments)    Unknown  . Spironolactone Rash    Family History  Problem Relation Age of Onset  . Cardiomyopathy Mother     ICD pacemaker-ischmic CM  . Heart failure Mother   . Hypertension Mother   . Cardiomyopathy Father     Deceased  . Coronary artery disease Father   . Heart failure Father   . Heart failure Brother   . Hypertension  Brother      Prior to Admission medications   Medication Sig Start Date End Date Taking? Authorizing Provider  albuterol (PROVENTIL HFA;VENTOLIN HFA) 108 (90 BASE) MCG/ACT inhaler Inhale 2 puffs into the lungs every 6 (six) hours as needed for wheezing or shortness of breath. 07/12/12  Yes Bronson Curb, MD  aspirin 325 MG tablet Take 1 tablet (325 mg total) by mouth daily. 07/12/12  Yes Bronson Curb, MD  budesonide-formoterol [facility]) 80-4.5 MCG/ACT  inhaler Inhale 2 puffs into the lungs 2 (two) times daily. 01/16/12  Yes Marinus Maw, MD  carvedilol (COREG) 25 MG tablet Take 25 mg by mouth 2 (two) times daily with a meal.   Yes Historical Provider, MD  diclofenac sodium (VOLTAREN) 1 % GEL Apply 2 g topically 4 (four) times daily.   Yes Historical Provider, MD  ferrous sulfate (IRON SUPPLEMENT) 325 (65 FE) MG tablet Take 325 mg by mouth daily with breakfast.   Yes Historical Provider, MD  fish oil-omega-3 fatty acids 1000 MG capsule Take 1 g by mouth daily.   Yes Historical Provider, MD  furosemide (LASIX) 40 MG tablet Take 1 tablet (40 mg total) by mouth 2 (two) times daily. 07/12/12  Yes Bronson Curb, MD  gabapentin (NEURONTIN) 300 MG capsule Take 300 mg by mouth at bedtime.  02/14/12  Yes Kathlen Brunswick, MD  levothyroxine (SYNTHROID, LEVOTHROID) 150 MCG tablet Take 300 mcg by mouth daily.   Yes Historical Provider, MD  lisinopril (PRINIVIL,ZESTRIL) 10 MG tablet Take 1 tablet (10 mg total) by mouth daily. 07/12/12  Yes Bronson Curb, MD  loratadine (CLARITIN) 10 MG tablet Take 1 tablet (10 mg total) by mouth daily. 07/12/12  Yes Bronson Curb, MD  lovastatin (MEVACOR) 40 MG tablet Take 1 tablet (40 mg total) by mouth at bedtime. 06/08/12  Yes Kathlen Brunswick, MD  Multiple Vitamins-Minerals (CENTRUM SILVER ADULT 50+ PO) Take by mouth. DAILY   Yes Historical Provider, MD  potassium chloride SA (K-DUR,KLOR-CON) 20 MEQ tablet Take 2 tablets (40 mEq total) by mouth 2 (two) times daily. 04/30/12  Yes Kathlen Brunswick, MD  ranitidine (ZANTAC) 150 MG tablet Take 150 mg by mouth 2 (two) times daily as needed. Heart Burn   Yes Historical Provider, MD   Physical Exam: Filed Vitals:   07/27/12 1107 07/27/12 1137 07/27/12 1300 07/27/12 1352  BP: 100/60 90/57 87/73  150/92  Pulse: 77 75 70 76  Temp:      TempSrc:    Oral  Resp: 19 22 18 20   SpO2: 98% 99% 97% 98%     General:  Obese alert NAD  Eyes: PERRL EOMI no scleral icterus  ENT: ears  clear no nasal drainage, oropharynx pink slightly dry  Neck: supple no JVD no lymphadenopathy  Cardiovascular: distant but regular No MGR No LEE PPP  Respiratory: normal effort BS distant but clear no wheeze no rhonchi no crackles  Abdomen: obese soft +BS non-tender to palpation  Skin: cool dry no rash no lesion  Musculoskeletal: no clubbing no cyanosis   Psychiatric: cooperative pleasant  Neurologic: speech somewhat slurred, no facial droop, left shoulder shrug weak, +pronator drift on left. UE strength 3/5 on left 4/5 on right. Lower extremity strength 3/5 left and 4/5 right  Labs on Admission:  Basic Metabolic Panel:  Recent Labs Lab 07/27/12 1051  NA 136  K 4.6  CL 99  CO2 27  GLUCOSE 96  BUN 14  CREATININE 0.89  CALCIUM 9.3   Liver Function Tests:  Recent Labs Lab 07/27/12 1051  AST 24  ALT 25  ALKPHOS 103  BILITOT 0.5  PROT 7.4  ALBUMIN 3.8   No results found for this basename: LIPASE, AMYLASE,  in the last 168 hours No results found for this basename: AMMONIA,  in the last 168 hours CBC:  Recent Labs Lab 07/27/12 1051  WBC 12.0*  NEUTROABS 8.1*  HGB 15.4*  HCT 46.4*  MCV 90.1  PLT 274   Cardiac Enzymes:  Recent Labs Lab 07/27/12 1051  TROPONINI <0.30    BNP (last 3 results) No results found for this basename: PROBNP,  in the last 8760 hours CBG:  Recent Labs Lab 07/27/12 1048  GLUCAP 98    Radiological Exams on Admission: Dg Chest 1 View  07/27/2012   *RADIOLOGY REPORT*  Clinical Data: Code stroke  CHEST - 1 VIEW  Comparison: 07/08/2012.  Findings: AICD in place entering from the left.  Lead unchanged in position.  Heart appears slightly enlarged.  This may reflect result of [facility] magnification.  Central pulmonary vascular prominence. No infiltrate, congestive heart failure or pneumothorax.  IMPRESSION: No acute abnormality.  Please see above.   Original Report Authenticated By: Lacy Duverney, M.D.   Ct Head Wo Contrast  07/27/2012    *RADIOLOGY REPORT*  Clinical Data: Code stroke, weakness  CT HEAD WITHOUT CONTRAST  Technique:  Contiguous axial images were obtained from the base of the skull through the vertex without contrast.  Comparison: 07/10/2012  Findings: Few motion artifacts, for which repeat imaging was performed. Normal ventricular morphology. No midline shift or mass effect. Grossly normal appearance of brain parenchyma. No intracranial hemorrhage, mass lesion or evidence of acute infarction. No extra-axial fluid collections. Bones sinuses unremarkable.  IMPRESSION: No acute intracranial abnormalities.  Findings called to Dr. Preston Fleeting on 07/27/2012 at 1045 hours.   Original Report Authenticated By: Ulyses Southward, M.D.    EKG: Independently reviewed. SR with occasional PVC  Assessment/Plan Principal Problem:   Left-sided weakness: acute on chronic. Given patients complex medical hx and recent hospitalization for TIA coupled with minimal neuro coverage at [facility] at this time it seems prudent to transfer to [facility] for thorough neuro eval. Will admit to tele at [facility]. Will continue ASA. Pt with recent echo, carotid doppler.  Active Problems:     Hypertension: stable continue home meds. Per recent card note, BP tends to remain low with medical therapy for cardiomyopathy and congestive heart failure. Monitor.      Cardiomyopathy: does not appear overloaded. Per recent cards note pt with severely impaired ventricular systolic function but well compensated.     Gastroesophageal reflux disease: at baseline . Continue home med    Hypothyroidism: continue synthroid    Tobacco abuse: counseled to stop.    Hyperlipidemia: check FLP. Continue statin    ICD -[employer]ude    Neuro will need to be called once patient arrives to [facility]   Code Status: full  Family Communication:  Disposition Plan: home when ready  Time spent: 60 minutes  Gwenyth Bender [facility] Pager [phone]  If 7PM-7AM, please contact  night-coverage [email or web address] Password [facility] 07/27/2012, 2:00 PM      ]

## 2012-07-27 NOTE — Consult Note (Signed)
Referring Physician: Dr. Suanne Marker    Chief Complaint: Acute onset of speech difficulty and left-sided weakness.  HPI: Melanie Cordova is an 52 y.o. female a history of hypertension, hyperlipidemia, obesity, cardiomyopathy, congestive heart failure, and implanted cardiac defibrillator who experienced weakness involving left side as well as speech output difficulty at about 10:10 AM today. Patient was exercising at rehabilitation at the time. She also had a pressure sensation in her chest as well as back of her neck. She was hospitalized here about 2-1/2-3 weeks ago for similar presentation thought to likely be TIA. CT scan of her head showed no signs of stroke. Repeat CT today also showed no indication of acute nor recent stroke. Transesophageal echocardiogram showed no embolus source. Aspirin was recommended at the time of discharge. Patient did not start taking aspirin until yesterday, however. Strength of the left side is improved as has her speech output. She was initially seen in the emergency room at [facility] and subsequently transferred here.  LSN: 10:10 AM on 07/27/2012 tPA Given: No: Beyond time window for treatment consideration, as well as resolving deficits. mRankin: Space 1  Past Medical History  Diagnosis Date  . Hypertension     09/2010-normal CMet and CBC; Lipid profile-116, 88, 25, 73  . Cardiomyopathy 08/2010    Presented with congestive heart failure; EF of 15% and 2012; hypotension on medication precludes optimal dosing  . Gastroesophageal reflux disease   . Hypothyroidism     Recent TSH was normal.  . Pneumonia   . Obesity   . Hyperlipidemia   . Tobacco abuse     20 pack years  . CHF (congestive heart failure)   . Asthma   . Shortness of breath   . Seizures   . Headache   . Arthritis   . Anxiety   . Neuropathy   . 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 Chronic systolic heart failure     Family History  Problem Relation Age of  Onset  . Cardiomyopathy Mother     ICD pacemaker-ischmic CM  . Heart failure Mother   . Hypertension Mother   . Cardiomyopathy Father     Deceased  . Coronary artery disease Father   . Heart failure Father   . Heart failure Brother   . Hypertension Brother      Medications:  I have reviewed the patient's current medications. Prior to Admission:  Prescriptions prior to admission  Medication Sig Dispense Refill  . albuterol (PROVENTIL HFA;VENTOLIN HFA) 108 (90 BASE) MCG/ACT inhaler Inhale 2 puffs into the lungs every 6 (six) hours as needed for wheezing or shortness of breath.  1 Inhaler  5  . aspirin 325 MG tablet Take 1 tablet (325 mg total) by mouth daily.  30 tablet  5  . budesonide-formoterol (SYMBICORT) 80-4.5 MCG/ACT inhaler Inhale 2 puffs into the lungs 2 (two) times daily.  1 Inhaler  6  . carvedilol (COREG) 25 MG tablet Take 25 mg by mouth 2 (two) times daily with a meal.      . diclofenac sodium (VOLTAREN) 1 % GEL Apply 2 g topically 4 (four) times daily.      . ferrous sulfate (IRON SUPPLEMENT) 325 (65 FE) MG tablet Take 325 mg by mouth daily with breakfast.      . fish oil-omega-3 fatty acids 1000 MG capsule Take 1 g by mouth daily.      . furosemide (LASIX) 40 MG tablet Take 1 tablet (40 mg total)  by mouth 2 (two) times daily.  60 tablet  6  . gabapentin (NEURONTIN) 300 MG capsule Take 300 mg by mouth at bedtime.       Marland Kitchen levothyroxine (SYNTHROID, LEVOTHROID) 150 MCG tablet Take 300 mcg by mouth daily.      Marland Kitchen lisinopril (PRINIVIL,ZESTRIL) 10 MG tablet Take 1 tablet (10 mg total) by mouth daily.  30 tablet  5  . loratadine (CLARITIN) 10 MG tablet Take 1 tablet (10 mg total) by mouth daily.  30 tablet  2  . lovastatin (MEVACOR) 40 MG tablet Take 1 tablet (40 mg total) by mouth at bedtime.  30 tablet  6  . Multiple Vitamins-Minerals (CENTRUM SILVER ADULT 50+ PO) Take by mouth. DAILY      . potassium chloride SA (K-DUR,KLOR-CON) 20 MEQ tablet Take 2 tablets (40 mEq total) by  mouth 2 (two) times daily.  120 tablet  6  . ranitidine (ZANTAC) 150 MG tablet Take 150 mg by mouth 2 (two) times daily as needed. Heart Burn       Scheduled: . [START ON 07/28/2012] aspirin  325 mg Oral Daily  . budesonide-formoterol  2 puff Inhalation BID  . [START ON 07/28/2012] carvedilol  25 mg Oral BID WC  . enoxaparin (LOVENOX) injection  40 mg Subcutaneous Q24H  . famotidine  20 mg Oral BID  . [START ON 07/28/2012] ferrous sulfate  325 mg Oral Q breakfast  . furosemide  40 mg Oral BID  . gabapentin  300 mg Oral QHS  . [START ON 07/28/2012] levothyroxine  300 mcg Oral QAC breakfast  . [START ON 07/28/2012] lisinopril  10 mg Oral Daily  . [START ON 07/28/2012] loratadine  10 mg Oral Daily  . potassium chloride SA  40 mEq Oral BID  . simvastatin  20 mg Oral q1800   Continuous:  ZOX:[id number], acetaminophen, albuterol, ondansetron (ZOFRAN) IV   Physical Examination: Blood pressure 112/67, pulse 68, temperature 97.8 F (36.6 C), temperature source Oral, resp. rate 18, height 5\' 7"  (1.702 m), weight 124.2 kg (273 lb 13 oz), SpO2 97.00%.  Neurologic Examination: Mental Status: Alert, oriented, thought content appropriate.  Slight hesitancy in speech flow with minimal word finding difficulty. Able to follow commands without difficulty. Cranial Nerves: II-Visual fields were normal. III/IV/VI-Pupils were equal and reacted. Extraocular movements were full and conjugate.    V/VII-no facial numbness and no facial weakness. VIII-normal. X-slight dysarthria. Motor: 5/5 bilaterally with normal tone and bulk Sensory: Normal throughout. Deep Tendon Reflexes: Trace to 1+ and symmetric throughout. Plantars: Mute bilaterally Cerebellar: Normal finger-to-nose testing. Carotid auscultation: Normal  Dg Chest 1 View  07/27/2012   *RADIOLOGY REPORT*  Clinical Data: Code stroke  CHEST - 1 VIEW  Comparison: 07/08/2012.  Findings: AICD in place entering from the left.  Lead unchanged in position.   Heart appears slightly enlarged.  This may reflect result of [facility] magnification.  Central pulmonary vascular prominence. No infiltrate, congestive heart failure or pneumothorax.  IMPRESSION: No acute abnormality.  Please see above.   Original Report Authenticated By: Lacy Duverney, M.D.   Ct Head Wo Contrast  07/27/2012   *RADIOLOGY REPORT*  Clinical Data: Code stroke, weakness  CT HEAD WITHOUT CONTRAST  Technique:  Contiguous axial images were obtained from the base of the skull through the vertex without contrast.  Comparison: 07/10/2012  Findings: Few motion artifacts, for which repeat imaging was performed. Normal ventricular morphology. No midline shift or mass effect. Grossly normal appearance of brain parenchyma. No intracranial hemorrhage, mass  lesion or evidence of acute infarction. No extra-axial fluid collections. Bones sinuses unremarkable.  IMPRESSION: No acute intracranial abnormalities.  Findings called to Dr. Preston Fleeting on 07/27/2012 at 1045 hours.   Original Report Authenticated By: Ulyses Southward, M.D.    Assessment: 52 y.o. female probable recurrent right subcortical TIA. Acute small right subcortical stroke cannot be ruled out at this point.  Stroke Risk Factors - hyperlipidemia and hypertension  Plan: 1. Repeat CT scan of the head without contrast to rule out signs of evolving acute stroke 2. PT consult, OT consult, Speech consult 3. Prophylactic therapy-aspirin 325 mg per day 4. Risk factor modification 5. Telemetry monitoring   C.R. Roseanne Reno, MD [facility] [phone]  07/27/2012, 9:24 PM

## 2012-07-27 NOTE — ED Provider Notes (Signed)
History  This chart was scribed for Dione Booze, MD,  by Concha Se, ED Scribe and Bennett Scrape, ED Scribe. The patient was seen in room APA15/APA15 and the patient's care was started at 10:22 AM    CSN: [id number]  Arrival date & time 07/27/12  1014      Chief Complaint  Patient presents with  . Code Stroke    The history is provided by the patient. No language interpreter was used.    HPI Comments: Melanie Cordova is a 52 y.o. female with h/o HTN and CHF who presents to the Emergency Department complaining of sudden onset of slurred speech with associated left facial droop, increased left-sided weakness, double vision described as offset, left upper chest pain described as pressure and HA in the occiput region described as pressure with onset 10 to 15 minutes ago while at physical therapy. She rates the chest pressure a 5/10 currently and denies any modifying factors.She was at PT to improve left sided weakness from a TIA diagnosed on 07/08/12. Pt reports that the left-sided weakness she is having currently is worse than when she was discharged.  She states she is taking aspirin but denies any change in her medications or new medications. Pt denies emesis, nausea, numbness and dizziness as associated symptoms. Pt states she is a current smoker, but denies alcohol use.  Pt states she is left handed.   Past Medical History  Diagnosis Date  . Hypertension     09/2010-normal CMet and CBC; Lipid profile-116, 88, 25, 73  . Cardiomyopathy 08/2010    Presented with congestive heart failure; EF of 15% and 2012; hypotension on medication precludes optimal dosing  . Gastroesophageal reflux disease   . Hypothyroidism     Recent TSH was normal.  . Pneumonia   . Obesity   . Hyperlipidemia   . Tobacco abuse     20 pack years  . CHF (congestive heart failure)   . Asthma   . Shortness of breath   . Seizures   . Headache   . Arthritis   . Anxiety   . Neuropathy   . 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 Chronic systolic heart failure     Past Surgical History  Procedure Laterality Date  . Cesarean section      X2  . Tee without cardioversion N/A 07/11/2012    Procedure: TRANSESOPHAGEAL ECHOCARDIOGRAM (TEE);  Surgeon: Vesta Mixer, MD;  Location: [facility] ENDOSCOPY;  Service: Cardiovascular;  Laterality: N/A;    Family History  Problem Relation Age of Onset  . Cardiomyopathy Mother     ICD pacemaker-ischmic CM  . Heart failure Mother   . Hypertension Mother   . Cardiomyopathy Father     Deceased  . Coronary artery disease Father   . Heart failure Father   . Heart failure Brother   . Hypertension Brother     History  Substance Use Topics  . Smoking status: Current Every Day Smoker -- 0.50 packs/day for 21 years    Types: Cigarettes  . Smokeless tobacco: Never Used  . Alcohol Use: No    No OB history provided.  Review of Systems  Eyes: Positive for visual disturbance.  Cardiovascular: Positive for chest pain. Negative for palpitations.  Neurological: Positive for facial asymmetry, speech difficulty, weakness and headaches. Negative for dizziness and numbness.  All other systems reviewed and are negative.    Allergies  Fish allergy; Lentil; Penicillins; Iodinated diagnostic agents; Iodine; Sulfa antibiotics;  and Spironolactone  Home Medications   Current Outpatient Rx  Name  Route  Sig  Dispense  Refill  . albuterol (PROVENTIL HFA;VENTOLIN HFA) 108 (90 BASE) MCG/ACT inhaler   Inhalation   Inhale 2 puffs into the lungs every 6 (six) hours as needed for wheezing or shortness of breath.   1 Inhaler   5   . aspirin 325 MG tablet   Oral   Take 1 tablet (325 mg total) by mouth daily.   30 tablet   5   . budesonide-formoterol (SYMBICORT) 80-4.5 MCG/ACT inhaler   Inhalation   Inhale 2 puffs into the lungs 2 (two) times daily.   1 Inhaler   6   . carvedilol (COREG) 25 MG tablet   Oral   Take 25 mg by  mouth 2 (two) times daily with a meal.         . diclofenac sodium (VOLTAREN) 1 % GEL   Topical   Apply 2 g topically 4 (four) times daily.         . ferrous sulfate (IRON SUPPLEMENT) 325 (65 FE) MG tablet   Oral   Take 325 mg by mouth daily with breakfast.         . fish oil-omega-3 fatty acids 1000 MG capsule   Oral   Take 1 g by mouth daily.         . furosemide (LASIX) 40 MG tablet   Oral   Take 1 tablet (40 mg total) by mouth 2 (two) times daily.   60 tablet   6   . gabapentin (NEURONTIN) 300 MG capsule   Oral   Take 300 mg by mouth at bedtime.          Marland Kitchen levothyroxine (SYNTHROID, LEVOTHROID) 150 MCG tablet   Oral   Take 300 mcg by mouth daily.         Marland Kitchen lisinopril (PRINIVIL,ZESTRIL) 10 MG tablet   Oral   Take 1 tablet (10 mg total) by mouth daily.   30 tablet   5   . loratadine (CLARITIN) 10 MG tablet   Oral   Take 1 tablet (10 mg total) by mouth daily.   30 tablet   2   . lovastatin (MEVACOR) 40 MG tablet   Oral   Take 1 tablet (40 mg total) by mouth at bedtime.   30 tablet   6   . Multiple Vitamins-Minerals (CENTRUM SILVER ADULT 50+ PO)   Oral   Take by mouth. DAILY         . potassium chloride SA (K-DUR,KLOR-CON) 20 MEQ tablet   Oral   Take 2 tablets (40 mEq total) by mouth 2 (two) times daily.   120 tablet   6   . ranitidine (ZANTAC) 150 MG tablet   Oral   Take 150 mg by mouth 2 (two) times daily as needed. Heart Burn           BP 100/64  Resp 20  SpO2 98%  Physical Exam  Nursing note and vitals reviewed. Constitutional: She is oriented to person, place, and time. She appears well-developed and well-nourished. No distress.  HENT:  Head: Normocephalic and atraumatic.  Left facial droop present.   Eyes: EOM are normal.  Neck: Neck supple. Carotid bruit is not present. No tracheal deviation present.  Cardiovascular: Normal rate and regular rhythm.   Pulmonary/Chest: Effort normal and breath sounds normal. No respiratory  distress.  Musculoskeletal: Normal range of motion.  Neurological: She is alert  and oriented to person, place, and time.  Speech is mildly dysarthric with dystonic postioning of jaw. Mild weakness of shrug on left. Positive pronator's drift on left. Left arm strength is 4/5. Left leg strength is 4+/5  Skin: Skin is warm and dry.  Psychiatric: She has a normal mood and affect. Her behavior is normal.    ED Course  Procedures (including critical care time)  DIAGNOSTIC STUDIES: Oxygen Saturation is 98% on room air, normal by my interpretation.    COORDINATION OF CARE: 10:34 AM Discussed ED treatment with pt and pt agrees to plan   Results for orders placed during the hospital encounter of 07/27/12  ETHANOL      Result Value Range   Alcohol, Ethyl (B) <11  0 - 11 mg/dL  PROTIME-INR      Result Value Range   Prothrombin Time 12.8  11.6 - 15.2 seconds   INR 0.97  0.00 - 1.49  APTT      Result Value Range   aPTT 26  24 - 37 seconds  CBC      Result Value Range   WBC 12.0 (*) 4.0 - 10.5 K/uL   RBC 5.15 (*) 3.87 - 5.11 MIL/uL   Hemoglobin 15.4 (*) 12.0 - 15.0 g/dL   HCT 40.9 (*) 81.1 - 91.4 %   MCV 90.1  78.0 - 100.0 fL   MCH 29.9  26.0 - 34.0 pg   MCHC 33.2  30.0 - 36.0 g/dL   RDW 78.2  95.6 - 21.3 %   Platelets 274  150 - 400 K/uL  DIFFERENTIAL      Result Value Range   Neutrophils Relative % 67  43 - 77 %   Neutro Abs 8.1 (*) 1.7 - 7.7 K/uL   Lymphocytes Relative 24  12 - 46 %   Lymphs Abs 2.9  0.7 - 4.0 K/uL   Monocytes Relative 7  3 - 12 %   Monocytes Absolute 0.8  0.1 - 1.0 K/uL   Eosinophils Relative 2  0 - 5 %   Eosinophils Absolute 0.2  0.0 - 0.7 K/uL   Basophils Relative 0  0 - 1 %   Basophils Absolute 0.1  0.0 - 0.1 K/uL  COMPREHENSIVE METABOLIC PANEL      Result Value Range   Sodium 136  135 - 145 mEq/L   Potassium 4.6  3.5 - 5.1 mEq/L   Chloride 99  96 - 112 mEq/L   CO2 27  19 - 32 mEq/L   Glucose, Bld 96  70 - 99 mg/dL   BUN 14  6 - 23 mg/dL    Creatinine, Ser 0.86  0.50 - 1.10 mg/dL   Calcium 9.3  8.4 - 57.8 mg/dL   Total Protein 7.4  6.0 - 8.3 g/dL   Albumin 3.8  3.5 - 5.2 g/dL   AST 24  0 - 37 U/L   ALT 25  0 - 35 U/L   Alkaline Phosphatase 103  39 - 117 U/L   Total Bilirubin 0.5  0.3 - 1.2 mg/dL   GFR calc non Af Amer 74 (*) >90 mL/min   GFR calc Af Amer 86 (*) >90 mL/min  TROPONIN I      Result Value Range   Troponin I <0.30  <0.30 ng/mL  URINE RAPID DRUG SCREEN (HOSP PERFORMED)      Result Value Range   Opiates NONE DETECTED  NONE DETECTED   Cocaine NONE DETECTED  NONE DETECTED  Benzodiazepines NONE DETECTED  NONE DETECTED   Amphetamines POSITIVE (*) NONE DETECTED   Tetrahydrocannabinol NONE DETECTED  NONE DETECTED   Barbiturates NONE DETECTED  NONE DETECTED  URINALYSIS, ROUTINE W REFLEX MICROSCOPIC      Result Value Range   Color, Urine YELLOW  YELLOW   APPearance CLEAR  CLEAR   Specific Gravity, Urine 1.020  1.005 - 1.030   pH 5.5  5.0 - 8.0   Glucose, UA NEGATIVE  NEGATIVE mg/dL   Hgb urine dipstick NEGATIVE  NEGATIVE   Bilirubin Urine NEGATIVE  NEGATIVE   Ketones, ur NEGATIVE  NEGATIVE mg/dL   Protein, ur NEGATIVE  NEGATIVE mg/dL   Urobilinogen, UA 0.2  0.0 - 1.0 mg/dL   Nitrite NEGATIVE  NEGATIVE   Leukocytes, UA NEGATIVE  NEGATIVE  GLUCOSE, CAPILLARY      Result Value Range   Glucose-Capillary 98  70 - 99 mg/dL   Dg Chest 1 View  1/61/0960   *RADIOLOGY REPORT*  Clinical Data: Code stroke  CHEST - 1 VIEW  Comparison: 07/08/2012.  Findings: AICD in place entering from the left.  Lead unchanged in position.  Heart appears slightly enlarged.  This may reflect result of AP magnification.  Central pulmonary vascular prominence. No infiltrate, congestive heart failure or pneumothorax.  IMPRESSION: No acute abnormality.  Please see above.   Original Report Authenticated By: Lacy Duverney, M.D.   Ct Head Wo Contrast  07/27/2012   *RADIOLOGY REPORT*  Clinical Data: Code stroke, weakness  CT HEAD WITHOUT  CONTRAST  Technique:  Contiguous axial images were obtained from the base of the skull through the vertex without contrast.  Comparison: 07/10/2012  Findings: Few motion artifacts, for which repeat imaging was performed. Normal ventricular morphology. No midline shift or mass effect. Grossly normal appearance of brain parenchyma. No intracranial hemorrhage, mass lesion or evidence of acute infarction. No extra-axial fluid collections. Bones sinuses unremarkable.  IMPRESSION: No acute intracranial abnormalities.  Findings called to Dr. Preston Fleeting on 07/27/2012 at 1045 hours.   Original Report Authenticated By: Ulyses Southward, M.D.   Ct Head Wo Contrast  07/10/2012   *RADIOLOGY REPORT*  Clinical Data:  Stroke.  Lightheaded.  CT HEAD WITHOUT CONTRAST  Technique:  Contiguous axial images were obtained from the base of the skull through the vertex without contrast  Comparison:  CT head 07/09/2012  Findings:  The brain has a normal appearance without evidence for hemorrhage, acute infarction, hydrocephalus, or mass lesion.  There is no extra axial fluid collection.  The skull and paranasal sinuses are normal.  IMPRESSION: Normal CT of the head without contrast.   Original Report Authenticated By: Janeece Riggers, M.D.   Ct Head Without Contrast  07/09/2012   *RADIOLOGY REPORT*  Clinical Data: The patient came to the Hospital yesterday with stroke-like symptoms with head pain and trouble speaking and left leg dragging and left arm weakness.  Improve this morning.  CT HEAD WITHOUT CONTRAST  Technique:  Contiguous axial images were obtained from the base of the skull through the vertex without contrast.  Comparison: 07/08/2012  Findings: No change from the prior study.  The ventricles are normal in size and configuration.  There are no parenchymal masses mass effect, no areas of abnormal parenchymal attenuation and no extra-axial masses or abnormal fluid collections.  There is no evidence of a recent infarct and there is no  intracranial hemorrhage.  The sinuses and mastoid air cells are clear.  IMPRESSION: Normal unenhanced head CT.   Original Report Authenticated  By: Amie Portland, M.D.   Ct Head Wo Contrast  07/08/2012   *RADIOLOGY REPORT*  Clinical Data: 52 year old female with left-sided weakness and slurred speech.  CT HEAD WITHOUT CONTRAST  Technique:  Contiguous axial images were obtained from the base of the skull through the vertex without contrast.  Comparison: 05/11/2012 and prior CTs.  Findings: No intracranial abnormalities are identified, including mass lesion or mass effect, hydrocephalus, extra-axial fluid collection, midline shift, hemorrhage, or acute infarction.  The visualized bony calvarium is unremarkable.  IMPRESSION: Unremarkable noncontrast head CT.  Critical Value/emergent results were called by telephone at the time of interpretation on 07/08/2012 at 1:53 p.m. to Dr. Judd Lien, who verbally acknowledged these results.   Original Report Authenticated By: Harmon Pier, M.D.   Dg Chest Portable 1 View  07/08/2012   *RADIOLOGY REPORT*  Clinical Data: Code stroke  PORTABLE CHEST - 1 VIEW  Comparison: 03/20/2012  Findings: Cardiac enlargement without heart failure.  Single lead pacemaker unchanged.  Negative for heart failure.  Negative for mass or pneumonia.  IMPRESSION: No acute cardiopulmonary abnormality.   Original Report Authenticated By: Janeece Riggers, M.D.    Date: 07/27/2012  Rate: 79  Rhythm: normal sinus rhythm and premature ventricular contractions (PVC)  QRS Axis: left  Intervals: normal  ST/T Wave abnormalities: nonspecific ST/T changes  Conduction Disutrbances:none  Narrative Interpretation: Occasional PVC, nonspecific ST and T changes. No prior ECG available for comparison  Old EKG Reviewed: none available   1. Stroke    CRITICAL CARE Performed by: Dione Booze Total critical care time: 50 minutes Critical care time was exclusive of separately billable procedures and treating other  patients. Critical care was necessary to treat or prevent imminent or life-threatening deterioration. Critical care was time spent personally by me on the following activities: development of treatment plan with patient and/or surrogate as well as nursing, discussions with consultants, evaluation of patient's response to treatment, examination of patient, obtaining history from patient or surrogate, ordering and performing treatments and interventions, ordering and review of laboratory studies, ordering and review of radiographic studies, pulse oximetry and re-evaluation of patient's condition.    MDM  Patient with apparent new stroke involving right hemisphere with resultant left facial droop and left hemiparesthesias. Speech is somewhat unusual in that she does not really have dysarthric speech but more has dystonic posturing. Old records are reviewed and she had similar presentation previously and actually improved dramatically between initial presentation and getting her CT scan. She was discharged with diagnosis of TIA. She's also had other cerebral vessels which have been labeled as TIA. She is sent for emergency CT scanning, but she is likely not a thrombolytic candidate because of possible recent stroke.  CT has come back unremarkable. Tell a neurology consultation was obtained and they feel that there is definite evidence of new stroke but she is not a thrombolytic candidate and recommend admission for neurologic observation. She cannot have MRI scan because of implanted defibrillator and cannot have CT angiogram because of allergy to IV dye. Case is discussed with Dr. Irene Limbo of [facility] who feels the patient would be best managed at [facility]. He has arranged to have her transferred to there under the care of Dr. Suanne Marker.      I personally performed the services described in this documentation, which was scribed in my presence. The recorded information has been reviewed  and is accurate.     Dione Booze, MD 07/27/12 1341

## 2012-07-27 NOTE — ED Notes (Signed)
Pt CBG is 98.

## 2012-07-27 NOTE — H&P (Signed)
Patient seen, independently examined and chart reviewed. I agree with exam, assessment and plan discussed with Toya Smothers, NP.  Objective: Appears calm and comfortable. Speech dysarthric. No obvious facial weakness seen. Left upper and lower extremity strength 4/5, weaker than right upper and lower extremity 5/5. No dysdiadochokinesis.  Labs/studies: CT head negative.screening laboratory studies unremarkable. EKG sinus rhythm, PVC, nonspecific ST changes. No acute changes seen.  Assessment:  Objective left-sided weakness acute on chronic, concern for TIA versus stroke  Recent right brain TIA with left-sided weakness on discharge  Nonischemic cardiomyopathy, appears clinically stable. Continue cardiac medications.  Plan:  Second event in the last 3 weeks. Would benefit from ongoing neurology evaluation, not available over the weekend at [facility]. Plan transferred to [facility]. Discussed with flow manager and Dr. Donna Bernard.  Chronic coronaries appear stable. Mild hypotension noted likely artifactual this cuff placement.  Summary: 52 year old woman with complex past medical history recently admitted to [facility] for possible stroke. Was seen by neurology at that time with final diagnoses being) TIA. Was discharged home with outpatient PT. She reports left-sided weakness prior to discharge. However today, at physical therapy she developed left facial numbness, left facial weakness and increased left arm and leg weakness above baseline. She was sent to the emergency department at [facility] where she was found to have objective findings by EDP of left-sided weakness.Teleneurology consultation was obtained the patient was not felt to be a candidate for TPA secondary to recent neurologic symptoms/possible stroke.   Brendia Sacks, MD [facility] [phone]

## 2012-07-27 NOTE — ED Notes (Signed)
When EDP in to examine pt. She stated she has pressure in the back of her head, seeing double and pain in left shoulder.

## 2012-07-27 NOTE — Progress Notes (Signed)
Patient arrived from [facility] ED via [facility].  Patient educated regarding unit policies.  Tele box applied and CCMD called.  Patient dinner ordered and drink given.   Care coordinator called who said she would alert the oncoming MD.  Will continue to monitor until change of shift.  Lance Bosch, RN

## 2012-07-27 NOTE — ED Notes (Signed)
RN at bedside for tele-neurology evaluation.

## 2012-07-27 NOTE — ED Notes (Signed)
Pt brought from PT to ED for evaluation of slurred speech and mouth drawn to left

## 2012-07-28 DIAGNOSIS — I6350 Cerebral infarction due to unspecified occlusion or stenosis of unspecified cerebral artery: Secondary | ICD-10-CM

## 2012-07-28 DIAGNOSIS — E039 Hypothyroidism, unspecified: Secondary | ICD-10-CM

## 2012-07-28 DIAGNOSIS — M6281 Muscle weakness (generalized): Secondary | ICD-10-CM

## 2012-07-28 NOTE — Progress Notes (Signed)
Stroke Team Progress Note  HISTORY Melanie Cordova is a 52 y.o. female a history of hypertension, hyperlipidemia, obesity, cardiomyopathy, congestive heart failure, and implanted cardiac defibrillator who experienced weakness involving left side as well as speech output difficulty at about 10:10 AM 07/27/12. Patient was exercising at rehabilitation at the time. She also had a pressure sensation in her chest as well as back of her neck. She was hospitalized here about 2-1/2-3 weeks ago for similar presentation thought to likely be a TIA. CT scan of her head showed no signs of stroke. Repeat CT 07/27/12 also showed no indication of acute nor recent stroke. Transesophageal echocardiogram showed no embolus source. Aspirin was recommended at the time of discharge. Patient did not start taking aspirin until yesterday, however. Strength of the left side is improved as has her speech output. She was initially seen in the emergency room at [facility] and subsequently transferred here.   LSN: 10:10 AM on 07/27/2012  tPA Given: No: Beyond time window for treatment consideration, as well as resolving deficits.  mRankin: Space 1  SUBJECTIVE No family members present this morning. The patient is feeling better although she does not feel back to normal.  OBJECTIVE Most recent Vital Signs: Filed Vitals:   07/28/12 0200 07/28/12 0400 07/28/12 0600 07/28/12 0828  BP: 133/66 128/60 151/89   Pulse: 74 71 79   Temp: 98.1 F (36.7 C) 98 F (36.7 C) 97.7 F (36.5 C)   TempSrc: Oral Oral Oral   Resp: 18 18 18    Height:      Weight:      SpO2: 98% 97% 98% 98%   CBG (last 3)   Recent Labs  07/27/12 1048  GLUCAP 98    IV Fluid Intake:     MEDICATIONS  . aspirin  325 mg Oral Daily  . budesonide-formoterol  2 puff Inhalation BID  . carvedilol  25 mg Oral BID WC  . enoxaparin (LOVENOX) injection  40 mg Subcutaneous Q24H  . famotidine  20 mg Oral BID  . ferrous sulfate  325 mg Oral Q breakfast   . furosemide  40 mg Oral BID  . gabapentin  300 mg Oral QHS  . levothyroxine  300 mcg Oral QAC breakfast  . lisinopril  10 mg Oral Daily  . loratadine  10 mg Oral Daily  . potassium chloride SA  40 mEq Oral BID  . simvastatin  20 mg Oral q1800   PRN:  acetaminophen, acetaminophen, albuterol, ondansetron (ZOFRAN) IV  Diet:  Cardiac thin liquids Activity:  Up with assistance DVT Prophylaxis:  Lovenox   CLINICALLY SIGNIFICANT STUDIES Basic Metabolic Panel:  Recent Labs Lab 07/27/12 1051 07/27/12 2055  NA 136  --   K 4.6  --   CL 99  --   CO2 27  --   GLUCOSE 96  --   BUN 14  --   CREATININE 0.89 0.92  CALCIUM 9.3  --    Liver Function Tests:  Recent Labs Lab 07/27/12 1051  AST 24  ALT 25  ALKPHOS 103  BILITOT 0.5  PROT 7.4  ALBUMIN 3.8   CBC:  Recent Labs Lab 07/27/12 1051 07/27/12 2055  WBC 12.0* 10.4  NEUTROABS 8.1*  --   HGB 15.4* 14.6  HCT 46.4* 43.9  MCV 90.1 88.3  PLT 274 257   Coagulation:  Recent Labs Lab 07/27/12 1051  LABPROT 12.8  INR 0.97   Cardiac Enzymes:  Recent Labs Lab 07/27/12 1051  TROPONINI <  0.30   Urinalysis:  Recent Labs Lab 07/27/12 1121  COLORURINE YELLOW  LABSPEC 1.020  PHURINE 5.5  GLUCOSEU NEGATIVE  HGBUR NEGATIVE  BILIRUBINUR NEGATIVE  KETONESUR NEGATIVE  PROTEINUR NEGATIVE  UROBILINOGEN 0.2  NITRITE NEGATIVE  LEUKOCYTESUR NEGATIVE   Lipid Panel    Component Value Date/Time   CHOL 173 07/09/2012 0120   TRIG 159* 07/09/2012 0120   HDL 32* 07/09/2012 0120   CHOLHDL 5.4 07/09/2012 0120   VLDL 32 07/09/2012 0120   LDLCALC 109* 07/09/2012 0120   HgbA1C  Lab Results  Component Value Date   HGBA1C 5.6 07/08/2012    Urine Drug Screen:     Component Value Date/Time   LABOPIA NONE DETECTED 07/27/2012 1121   COCAINSCRNUR NONE DETECTED 07/27/2012 1121   LABBENZ NONE DETECTED 07/27/2012 1121   AMPHETMU POSITIVE* 07/27/2012 1121   THCU NONE DETECTED 07/27/2012 1121   LABBARB NONE DETECTED 07/27/2012 1121     Alcohol Level:  Recent Labs Lab 07/27/12 1051  ETH <11    Dg Chest 1 View 07/27/2012 AICD in place entering from the left. No acute abnormality.     Ct Head Wo Contrast  07/27/2012    No acute intracranial abnormalities.    :  MRI of the brain  unable to perform secondary to AICD  TEE performed on 07/11/2012 - ejection fraction 25-30% - no cardiac source of emboli identified.  Carotid Doppler  07/09/12 - No significant extracranial carotid artery stenosis demonstrated. Vertebrals are patent with antegrade flow.  EKG  . Sinus rhythm rate 79 beats per minute.  Therapy Recommendations pending  Physical Exam   General - 52 year old female in bed in no acute distress. Heart - Regular rate and rhythm - distant heart sounds. Lungs - Clear to auscultation Abdomen - Soft - non tender Extremities - Distal pulses weak but intact - trace edema Skin - Warm and dry  NEUROLOGIC:   MENTAL STATUS: awake, alert, oriented, mild dysarthria,  follows simple commands.  CRANIAL NERVES: pupils equal and reactive to light,extraocular muscles intact, facial sensation and strength symmetric, uvula midlinec, tongue midline MOTOR: normal bulk and tone, Strength -over 5 on the right - 4+ over 5 both upper and lower left extremities. SENSORY: normal and symmetric to light touch  COORDINATION: finger-nose-finger normal - heel to shin normal     ASSESSMENT Melanie Cordova is a 52 y.o. female presenting with left hemiparesis and  aphasia. T-PA was not given secondary to resolving symptoms and being out of the therapeutic window. A CT scan of the head showed no acute abnormalities. An MRI cannot be performed secondary to an AICD.  On aspirin 325 mg orally every day prior to admission, but she was not taking. Now on aspirin 325 mg orally every day for secondary stroke prevention. Patient with resultant mild residual left hemiparesis and mild dysarthria. Work up underway.   Positive drug screen  for amphetamines. The patient denies use of illicit drugs, but no amphetamines on home med list  Cardiomyopathy with implantable defibrillator  Hyperlipidemia - Zocor prior to admission.  Hypertension history  Previous CVA by history.  Hospital day # 1  The patient indicates a history of seizures from age 50, with the last seizure occurring at age 29. The patient was on phenobarbital and Dilantin in the past, currently she is on no antiepileptic medications. Given this history, EEG evaluation may be indicated.  TREATMENT/PLAN  Continue aspirin 325 mg orally every day for secondary stroke prevention.  Await  therapists evaluations  EEG study  Hassel Neth [facility] Pager [phone] 07/28/2012, 9:53 AM  I have personally obtained a history, examined the patient, evaluated imaging results, and formulated the assessment and plan of care. I agree with the above. Lesly Dukes

## 2012-07-28 NOTE — Evaluation (Signed)
Physical Therapy Evaluation Patient Details Name: Melanie Cordova MRN: [id number] DOB: 06-Feb-1961 Today's Date: 07/28/2012 Time: 3474-2595 PT Time Calculation (min): 32 min  PT Assessment / Plan / Recommendation Clinical Impression  Patient is a 52 yo female admitted with Lt sided weakness and difficulty with speech, for stroke/TIA work-up.  Lt weakness has resolved.  Per patient, speech remains impacted.  Patient is independent with mobility/gait. Has 13 steps in apartment.  Will benefit from acute PT to ensure patient safety with stairs prior to discharge.  Do not anticipate any f/u PT needs.    PT Assessment  Patient needs continued PT services    Follow Up Recommendations  No PT follow up;Supervision - Intermittent    Does the patient have the potential to tolerate intense rehabilitation      Barriers to Discharge None      Equipment Recommendations  None recommended by PT    Recommendations for Other Services     Frequency Other (Comment) (One more PT session)    Precautions / Restrictions Precautions Precautions: None Restrictions Weight Bearing Restrictions: No   Pertinent Vitals/Pain       Mobility  Bed Mobility Bed Mobility: Supine to Sit;Sitting - Scoot to Edge of Bed Supine to Sit: 7: Independent Sitting - Scoot to Edge of Bed: 7: Independent Details for Bed Mobility Assistance: No cues or assistance needed Transfers Transfers: Sit to Stand;Stand to Sit Sit to Stand: 7: Independent;From bed;From toilet Stand to Sit: 7: Independent;To bed;To toilet Details for Transfer Assistance: No cues or assist needed Ambulation/Gait Ambulation/Gait Assistance: 6: Modified independent (Device/Increase time) (Increased time) Ambulation Distance (Feet): 300 Feet Assistive device: None Ambulation/Gait Assistance Details: Patient required 1 seated rest break, with dyspnea 3/4.  Patient with good balance, however slow gait speed. Gait Pattern: Step-through  pattern;Decreased stride length Gait velocity: Decreased Stairs: No (Patient declined at this time due to dyspnea) Modified Rankin (Stroke Patients Only) Pre-Morbid Rankin Score: Moderate disability Modified Rankin: Moderate disability    Exercises     PT Diagnosis: Difficulty walking  PT Problem List: Decreased activity tolerance;Decreased mobility;Cardiopulmonary status limiting activity;Obesity PT Treatment Interventions: Stair training;Functional mobility training;Patient/family education   PT Goals Acute Rehab PT Goals PT Goal Formulation: With patient Time For Goal Achievement: 07/30/12 Potential to Achieve Goals: Good Pt will Go Up / Down Stairs: 6-9 stairs;with modified independence;with rail(s);with least restrictive assistive device PT Goal: Up/Down Stairs - Progress: Goal set today  Visit Information  Last PT Received On: 07/28/12 Assistance Needed: +1    Subjective Data  Subjective: "I'm feeling OK"  "Still having trouble with my speech." Patient Stated Goal: To return home   Prior Functioning  Home Living Lives With: Alone Available Help at Discharge: Friend(s);Available PRN/intermittently;Family Type of Home: Apartment Home Access: Stairs to enter Secretary/administrator of Steps: 2 Entrance Stairs-Rails: None Home Layout: Two level;Bed/bath upstairs Alternate Level Stairs-Number of Steps: 13 Alternate Level Stairs-Rails: Right;Left Bathroom Shower/Tub: Engineer, manufacturing systems: Standard Bathroom Accessibility: Yes How Accessible: Accessible via walker Home Adaptive Equipment: Straight cane Prior Function Level of Independence: Independent with assistive device(s) (Uses cane outside) Able to Take Stairs?: Yes Driving: No Vocation: Unemployed Communication Communication: Expressive difficulties Dominant Hand: Left    Cognition  Cognition Arousal/Alertness: Awake/alert Behavior During Therapy: WFL for tasks assessed/performed Overall  Cognitive Status: Within Functional Limits for tasks assessed    Extremity/Trunk Assessment Right Upper Extremity Assessment RUE ROM/Strength/Tone: WFL for tasks assessed RUE Sensation: WFL - Light Touch Left Upper Extremity Assessment LUE  ROM/Strength/Tone: [facility] for tasks assessed LUE Sensation: WFL - Light Touch Right Lower Extremity Assessment RLE ROM/Strength/Tone: WFL for tasks assessed RLE Sensation: History of peripheral neuropathy RLE Coordination: WFL - gross motor Left Lower Extremity Assessment LLE ROM/Strength/Tone: WFL for tasks assessed LLE Sensation: History of peripheral neuropathy LLE Coordination: WFL - gross motor Trunk Assessment Trunk Assessment: Normal   Balance Balance Balance Assessed: Yes High Level Balance High Level Balance Activites: Direction changes;Turns;Sudden stops;Head turns (Walking around objects; Stepping over objects) High Level Balance Comments: No loss of balance during high level activities.  End of Session PT - End of Session Equipment Utilized During Treatment: Gait belt Activity Tolerance: Patient limited by fatigue;Treatment limited secondary to medical complications (Comment) (COPD - dyspnea 3/4) Patient left: in bed;with call bell/phone within reach;with bed alarm set (sitting at EOB) Nurse Communication: Mobility status  GP Functional Assessment Tool Used: Clinical judgement Functional Limitation: Mobility: Walking and moving around Mobility: Walking and Moving Around Current Status (W1191): At least 1 percent but less than 20 percent impaired, limited or restricted Mobility: Walking and Moving Around Goal Status [phone]): At least 1 percent but less than 20 percent impaired, limited or restricted   Vena Austria 07/28/2012, 9:16 AM Durenda Hurt. Renaldo Fiddler, [facility] Pager [phone]

## 2012-07-28 NOTE — Progress Notes (Signed)
UR Completed Marleni Gallardo Graves-Bigelow, RN,BSN [phone]  

## 2012-07-28 NOTE — Evaluation (Signed)
Speech Language Pathology Evaluation Patient Details Name: Melanie Cordova MRN: [id number] DOB: 1960-06-17 Today's Date: 07/28/2012 Time: 7829-5621 SLP Time Calculation (min): 15 min  Problem List:  Patient Active Problem List   Diagnosis Date Noted  . Left-sided weakness 07/27/2012  . Gingival disease 07/25/2012  . TIA (transient ischemic attack) 11/07/2011  . 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 Hypokalemia 10/01/2011  . Tobacco abuse   . Hyperlipidemia   . Hypertension   . Gastroesophageal reflux disease   . Hypothyroidism   . Obesity 09/30/2010  . Cardiomyopathy 08/13/2010   Past Medical History:  Past Medical History  Diagnosis Date  . Hypertension     09/2010-normal CMet and CBC; Lipid profile-116, 88, 25, 73  . Cardiomyopathy 08/2010    Presented with congestive heart failure; EF of 15% and 2012; hypotension on medication precludes optimal dosing  . Gastroesophageal reflux disease   . Hypothyroidism     Recent TSH was normal.  . Pneumonia   . Obesity   . Hyperlipidemia   . Tobacco abuse     20 pack years  . CHF (congestive heart failure)   . Asthma   . Shortness of breath   . Seizures   . Headache   . Arthritis   . Anxiety   . Neuropathy   . 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 Chronic systolic heart failure    Past Surgical History:  Past Surgical History  Procedure Laterality Date  . Cesarean section      X2  . Tee without cardioversion N/A 07/11/2012    Procedure: TRANSESOPHAGEAL ECHOCARDIOGRAM (TEE);  Surgeon: Vesta Mixer, MD;  Location: [facility] ENDOSCOPY;  Service: Cardiovascular;  Laterality: N/A;   HPI:  Melanie Cordova is a 52 y.o. female a history of hypertension, hyperlipidemia, obesity, cardiomyopathy, congestive heart failure, and implanted cardiac defibrillator who experienced weakness involving left side as well as speech output difficulty at about 10:10 AM 07/27/12. Patient was exercising at rehabilitation at the time. She  also had a pressure sensation in her chest as well as back of her neck. She was hospitalized here about 2-1/2-3 weeks ago for similar presentation thought to likely be a TIA. CT scan of her head showed no signs of stroke. Repeat CT 07/27/12 also showed no indication of acute nor recent stroke. Transesophageal echocardiogram showed no embolus source. Aspirin was recommended at the time of discharge. Patient did not start taking aspirin until yesterday, however. Strength of the left side is improved as has her speech output. She was initially seen in the emergency room at [facility] and subsequently transferred here.  Referred for Cognitive Linguistic Evaluation per stroke protocol.    Assessment / Plan / Recommendation Clinical Impression   Cognitive Linguistic Evaluation completed.  Deficits in short term memory and higher level problem solving.  Suspect baseline cognitive deficits but no family members present to confirm.  No f/u required in acute care setting as cognitive skills functional in current setting.  Patient may benefit from Speech Therapy consult in outpatient setting to further assess memory and problem solving to ensure safety in home environment.  Due to noted memory deficits patient may benefit from supervision for medication management and complex ADL's. ST to sign off as education complete.        Follow Up Recommendations  Outpatient SLP        SLP Evaluation Prior Functioning  Cognitive/Linguistic Baseline: Information not available Type of Home: Apartment Lives With: Alone Available Help  at Discharge: Friend(s);Available PRN/intermittently Education: GED Vocation: On disability   Cognition  Overall Cognitive Status: No family/caregiver present to determine baseline cognitive functioning Arousal/Alertness: Awake/alert Orientation Level: Oriented X4 Memory: Impaired Memory Impairment: Decreased short term memory Awareness: Appears intact Problem Solving:  Impaired Problem Solving Impairment: Verbal complex;Functional complex Behaviors: Lability Safety/Judgment: Appears intact    Comprehension  Auditory Comprehension Overall Auditory Comprehension: Appears within functional limits for tasks assessed Yes/No Questions: Within Functional Limits Commands: Impaired Reading Comprehension Reading Status: Not tested    Expression Expression Primary Mode of Expression: Verbal Verbal Expression Overall Verbal Expression: Impaired Initiation: Impaired Repetition: No impairment Naming: Impairment Responsive: 76-100% accurate Confrontation: Within functional limits Convergent: 25-49% accurate Divergent: 25-49% accurate Non-Verbal Means of Communication: Not applicable Written Expression Dominant Hand: Left Written Expression: Not tested   Oral / Motor Oral Motor/Sensory Function Overall Oral Motor/Sensory Function: Appears within functional limits for tasks assessed Motor Speech Overall Motor Speech: Appears within functional limits for tasks assessed   GO    Moreen Fowler MS, CCC-SLP [phone] [facility] 07/28/2012, 10:21 AM

## 2012-07-28 NOTE — Progress Notes (Signed)
Nutrition Brief Note  Patient identified on the Malnutrition Screening Tool (MST) Report  Pt reports hx of weight loss recently due to change in her thyroid medications. This weight loss was intentional. Pt with no nutrition concerns at this time.   Body mass index is 42.87 kg/(m^2). Patient meets criteria for Extreme Obesity Class III based on current BMI.   Current diet order is Heart Healthy, patient is consuming approximately 100% of meals at this time. Labs and medications reviewed.   No nutrition interventions warranted at this time. If nutrition issues arise, please consult RD.   Kendell Bane RD, LDN, CNSC [phone] Pager [phone] After Hours Pager

## 2012-07-28 NOTE — Evaluation (Signed)
Occupational Therapy Evaluation Patient Details Name: Melanie Cordova MRN: [id number] DOB: 04-13-1960 Today's Date: 07/28/2012 Time: 6213-0865 OT Time Calculation (min): 25 min  OT Assessment / Plan / Recommendation Clinical Impression  Pt admitted with Lt sided weakness and difficulty with speech, for stroke/TIA work-up.  Lt weakness has resolved.  Educated pt on self care and use of AE. Provided pt with indigent equipment.  Education completed. No further acute OT services needed. Signing off.    OT Assessment  Patient does not need any further OT services    Follow Up Recommendations  No OT follow up;Supervision - Intermittent    Barriers to Discharge      Equipment Recommendations  None recommended by OT    Recommendations for Other Services    Frequency       Precautions / Restrictions Precautions Precautions: None Restrictions Weight Bearing Restrictions: No   Pertinent Vitals/Pain See vitals   ADL  Eating/Feeding: Performed;Independent Where Assessed - Eating/Feeding: Chair Grooming: Performed;Wash/dry hands;Brushing hair;Modified independent Where Assessed - Grooming: Unsupported standing Upper Body Bathing: Simulated;Modified independent Where Assessed - Upper Body Bathing: Unsupported sitting Lower Body Bathing: Simulated;Modified independent Where Assessed - Lower Body Bathing: Unsupported sit to stand Upper Body Dressing: Performed;Modified independent Where Assessed - Upper Body Dressing: Unsupported sitting Lower Body Dressing: Performed;Modified independent Where Assessed - Lower Body Dressing: Unsupported sit to stand Toilet Transfer: Simulated;Modified independent Toilet Transfer Method:  (ambulating) Acupuncturist:  (bed, ambulated in room, then to chair) Equipment Used: Sock aid;Reacher;Long-handled sponge;Long-handled shoe horn Transfers/Ambulation Related to ADLs: mod I without device ADL Comments: Pt able to perform LB  bathing/dressing tasks but with difficulty at times.  Educated and demo'd use of AE, and pt able to return demonstration.  Pt very appreciative for AE education and reports she feels that she can benefit from it at home.  Provided pt with indigent equipment.   2/4 dyspnea while ambulating in room. Independently verbalized EC techniques that she uses at home (sits on tub ledge for showers, takes seated rest breaks on chairs through home, paces herself during tasks).     OT Diagnosis:    OT Problem List:   OT Treatment Interventions:     OT Goals    Visit Information  Last OT Received On: 07/28/12 Assistance Needed: +1    Subjective Data      Prior Functioning     Home Living Lives With: Alone Available Help at Discharge: Friend(s);Available PRN/intermittently Type of Home: Apartment Home Access: Stairs to enter Entrance Stairs-Number of Steps: 2 Entrance Stairs-Rails: None Home Layout: Two level;Bed/bath upstairs Alternate Level Stairs-Number of Steps: 13 Alternate Level Stairs-Rails: Right;Left Bathroom Shower/Tub: Engineer, manufacturing systems: Standard Bathroom Accessibility: Yes How Accessible: Accessible via walker Home Adaptive Equipment: Straight cane Prior Function Level of Independence: Independent with assistive device(s) (Uses cane outside) Able to Take Stairs?: Yes Driving: No Vocation: On disability Comments: Pt takes seated rest breaks and reports she has to pace herself during ADLs/IADLs. Daughter and friend take pt to grocery store and out in community as needed. Communication Communication: No difficulties Dominant Hand: Left         Vision/Perception Vision - History Patient Visual Report: No change from baseline   Cognition  Cognition Arousal/Alertness: Awake/alert Behavior During Therapy: WFL for tasks assessed/performed Overall Cognitive Status: Within Functional Limits for tasks assessed    Extremity/Trunk Assessment Right Upper  Extremity Assessment RUE ROM/Strength/Tone: WFL for tasks assessed RUE Sensation: WFL - Light Touch;WFL - Proprioception RUE Coordination:  WFL - gross/fine motor Left Upper Extremity Assessment LUE ROM/Strength/Tone: WFL for tasks assessed LUE Sensation: WFL - Light Touch;WFL - Proprioception LUE Coordination: WFL - gross/fine motor Right Lower Extremity Assessment RLE ROM/Strength/Tone: WFL for tasks assessed RLE Sensation: History of peripheral neuropathy RLE Coordination: WFL - gross motor Left Lower Extremity Assessment LLE ROM/Strength/Tone: WFL for tasks assessed LLE Sensation: History of peripheral neuropathy LLE Coordination: WFL - gross motor Trunk Assessment Trunk Assessment: Normal     Mobility Bed Mobility Bed Mobility: Supine to Sit;Sitting - Scoot to Edge of Bed Supine to Sit: 7: Independent Sitting - Scoot to Edge of Bed: 7: Independent Details for Bed Mobility Assistance: No cues or assistance needed Transfers Transfers: Sit to Stand;Stand to Sit Sit to Stand: 7: Independent;From bed Stand to Sit: 7: Independent;To chair/3-in-1 Details for Transfer Assistance: No cues or assist needed     Exercise     Balance    End of Session OT - End of Session Equipment Utilized During Treatment:  (AE) Activity Tolerance: Patient tolerated treatment well Patient left: in chair;with call bell/phone within reach Nurse Communication: Mobility status  GO Functional Assessment Tool Used: clinical judgement Functional Limitation: Self care Self Care Current Status (W0981): 0 percent impaired, limited or restricted Self Care Goal Status (X9147): 0 percent impaired, limited or restricted Self Care Discharge Status [phone]): 0 percent impaired, limited or restricted  07/28/2012 Cipriano Mile OTR/L Pager [phone] Office [phone]  Cipriano Mile 07/28/2012, 12:11 PM

## 2012-07-28 NOTE — Progress Notes (Signed)
[facility] PROGRESS NOTE  Melanie Cordova GNF:[id number] DOB: 11-24-1960 DOA: 07/27/2012 PCP: [name], NP  Assessment/Plan: Left-sided weakness: acute on chronic, pt recently hospitalized for TIA,  S/P with recent echo, carotid doppler.  -repeat CT neg -follow EEG ordered per neuro -PT not recommending any f/u -OT, ST consulted today, will follow Active Problems:  Hypertension:  -ok control, continue home meds. Per recent card note, BP tends to remain low with medical therapy for cardiomyopathy and congestive heart failure. Monitor.  Cardiomyopathy: - does not appear overloaded. Per recent cards note pt with severely impaired ventricular systolic function but well compensated.  Gastroesophageal reflux disease:  - Continue home med  Hypothyroidism: continue synthroid  Tobacco abuse:  -counseled to stop on admission.  Hyperlipidemia: - check FLP. Continue statin  ICD -St.Jude   Code Status: full Family Communication: directly with pt at bedside Disposition Plan: to home when medically ready   Consultants:  Neuro  Procedures:  none  Antibiotics:  none  HPI/Subjective: Pt states no further L sided weakness, speech better  Objective: Filed Vitals:   07/28/12 0400 07/28/12 0600 07/28/12 0828 07/28/12 0900  BP: 128/60 151/89  108/66  Pulse: 71 79  78  Temp: 98 F (36.7 Cordova) 97.7 F (36.5 Cordova)  97.5 F (36.4 Cordova)  TempSrc: Oral Oral  Oral  Resp: 18 18  18   Height:      Weight:      SpO2: 97% 98% 98% 100%    Intake/Output Summary (Last 24 hours) at 07/28/12 1134 Last data filed at 07/27/12 2200  Gross per 24 hour  Intake    120 ml  Output      0 ml  Net    120 ml   Filed Weights   07/27/12 1838  Weight: 124.2 kg (273 lb 13 oz)    Exam:   General: pt sitting up in chair, alert and oriented x3, in NAD  Cardiovascular: RRR, nl S1S2  Respiratory: CTAB  Abdomen: soft +BS NT/ND  Extremities: no cyanosis , no edema  Data Reviewed: Basic  Metabolic Panel:  Recent Labs Lab 07/27/12 1051 07/27/12 2055  NA 136  --   K 4.6  --   CL 99  --   CO2 27  --   GLUCOSE 96  --   BUN 14  --   CREATININE 0.89 0.92  CALCIUM 9.3  --    Liver Function Tests:  Recent Labs Lab 07/27/12 1051  AST 24  ALT 25  ALKPHOS 103  BILITOT 0.5  PROT 7.4  ALBUMIN 3.8   No results found for this basename: LIPASE, AMYLASE,  in the last 168 hours No results found for this basename: AMMONIA,  in the last 168 hours CBC:  Recent Labs Lab 07/27/12 1051 07/27/12 2055  WBC 12.0* 10.4  NEUTROABS 8.1*  --   HGB 15.4* 14.6  HCT 46.4* 43.9  MCV 90.1 88.3  PLT 274 257   Cardiac Enzymes:  Recent Labs Lab 07/27/12 1051  TROPONINI <0.30   BNP (last 3 results) No results found for this basename: PROBNP,  in the last 8760 hours CBG:  Recent Labs Lab 07/27/12 1048  GLUCAP 98    No results found for this or any previous visit (from the past 240 hour(s)).   Studies: Dg Chest 1 View  07/27/2012   *RADIOLOGY REPORT*  Clinical Data: Code stroke  CHEST - 1 VIEW  Comparison: 07/08/2012.  Findings: AICD in place entering from the left.  Lead unchanged in position.  Heart appears slightly enlarged.  This may reflect result of AP magnification.  Central pulmonary vascular prominence. No infiltrate, congestive heart failure or pneumothorax.  IMPRESSION: No acute abnormality.  Please see above.   Original Report Authenticated By: Lacy Duverney, M.D.   Ct Head Wo Contrast  07/27/2012   *RADIOLOGY REPORT*  Clinical Data: Code stroke, weakness  CT HEAD WITHOUT CONTRAST  Technique:  Contiguous axial images were obtained from the base of the skull through the vertex without contrast.  Comparison: 07/10/2012  Findings: Few motion artifacts, for which repeat imaging was performed. Normal ventricular morphology. No midline shift or mass effect. Grossly normal appearance of brain parenchyma. No intracranial hemorrhage, mass lesion or evidence of acute  infarction. No extra-axial fluid collections. Bones sinuses unremarkable.  IMPRESSION: No acute intracranial abnormalities.  Findings called to Dr. Preston Fleeting on 07/27/2012 at 1045 hours.   Original Report Authenticated By: Ulyses Southward, M.D.    Scheduled Meds: . aspirin  325 mg Oral Daily  . budesonide-formoterol  2 puff Inhalation BID  . carvedilol  25 mg Oral BID WC  . enoxaparin (LOVENOX) injection  40 mg Subcutaneous Q24H  . famotidine  20 mg Oral BID  . ferrous sulfate  325 mg Oral Q breakfast  . furosemide  40 mg Oral BID  . gabapentin  300 mg Oral QHS  . levothyroxine  300 mcg Oral QAC breakfast  . lisinopril  10 mg Oral Daily  . loratadine  10 mg Oral Daily  . potassium chloride SA  40 mEq Oral BID  . simvastatin  20 mg Oral q1800   Continuous Infusions:   Principal Problem:   TIA (transient ischemic attack) Active Problems:   Obesity   Hypertension   Cardiomyopathy   Gastroesophageal reflux disease   Hypothyroidism   Tobacco abuse   Hyperlipidemia   ICD -St.Jude   Left-sided weakness    Time spent: 35    Melanie Cordova  [facility] Pager [phone]. If 7PM-7AM, please contact night-coverage at [email or web address], password [facility] 07/28/2012, 11:34 AM  LOS: 1 day

## 2012-07-29 DIAGNOSIS — I428 Other cardiomyopathies: Secondary | ICD-10-CM

## 2012-07-29 DIAGNOSIS — I10 Essential (primary) hypertension: Secondary | ICD-10-CM

## 2012-07-29 DIAGNOSIS — Z95810 Presence of automatic (implantable) cardiac defibrillator: Secondary | ICD-10-CM

## 2012-07-29 LAB — BASIC METABOLIC PANEL
BUN: 12 mg/dL (ref 6–23)
Chloride: 100 mEq/L (ref 96–112)
GFR calc Af Amer: >90 mL/min (ref >90)
GFR calc non Af Amer: >90 mL/min (ref >90)
Potassium: 4.1 mEq/L (ref 3.5–5.1)
Sodium: 136 mEq/L (ref 135–145)

## 2012-07-29 LAB — CBC
HCT: 42.1 % (ref 36.0–46.0)
Hemoglobin: 14.1 g/dL (ref 12.0–15.0)
MCHC: 33.5 g/dL (ref 30.0–36.0)
WBC: 8.5 10*3/uL (ref 4.0–10.5)

## 2012-07-29 MED ORDER — CARVEDILOL 25 MG PO TABS: 12.5000 mg | ORAL_TABLET | Freq: Two times a day (BID) | ORAL | Status: DC

## 2012-07-29 MED ORDER — CARVEDILOL 12.5 MG PO TABS
12.5000 mg | ORAL_TABLET | Freq: Two times a day (BID) | ORAL | Status: DC
  Filled 2012-07-29 (×2): qty 1

## 2012-07-29 NOTE — Care Management Note (Addendum)
  Page 1 of 1   07/29/2012     3:41:55 PM   CARE MANAGEMENT NOTE 07/29/2012  Patient:  Melanie Cordova, Melanie Cordova   Account Number:  [id number]  Date Initiated:  07/29/2012  Documentation initiated by:  Donn Pierini  Subjective/Objective Assessment:   Pt admitted with TIA vs Stroke     Action/Plan:   PTA pt lived at home   Anticipated DC Date:  07/29/2012   Anticipated DC Plan:  HOME/SELF CARE      DC Planning Services  CM consult  OP Neuro Rehab      Choice offered to / List presented to:             Status of service:  Completed, signed off Medicare Important Message given?   (If response is "NO", the following Medicare IM given date fields will be blank) Date Medicare IM given:   Date Additional Medicare IM given:    Discharge Disposition:  HOME/SELF CARE  Per UR Regulation:    If discussed at Long Length of Stay Meetings, dates discussed:    Comments:  07/29/12- 1500- Donn Pierini RN, BSN [phone] Pt for d/c today- referral for outpt ST- spoke with pt at bedside- per conversation pt is agreeable to referral to [facility] outpt Neuro rehab for ST- verbal order for outpt ST received from MD- order for outpt Neuro rehab referral faxed to [facility] outpt Neuro rehab. Pt given address and phone # to Neuro rehab.- per MD pt will need outpt EEG will f/u in am regarding scheduling of EEG

## 2012-07-29 NOTE — Progress Notes (Signed)
SLP Cancellation Note  Patient Details Name: Melanie Cordova MRN: [id number] DOB: 1960-09-30   Cancelled treatment:     Received order for SLE this date.  Cognitive Linguistic Evaluation completed on 07/28/12 with no f/u recommended in acute care setting.  ST to sign off.   Moreen Fowler MS, CCC-SLP [phone] [facility] 07/29/2012, 9:37 AM

## 2012-07-29 NOTE — Progress Notes (Signed)
Physical Therapy Discharge Patient Details Name: Melanie Cordova MRN: [id number] DOB: 07-31-1960 Today's Date: 07/29/2012 Time: 4098-1191 PT Time Calculation (min): 14 min  Patient discharged from PT services secondary to goals met and no further PT needs identified.  Please see latest therapy progress note for current level of functioning and progress toward goals.    Progress and discharge plan discussed with patient and/or caregiver: Patient/Caregiver agrees with plan  GP     Vena Austria 07/29/2012, 9:13 AM

## 2012-07-29 NOTE — Discharge Summary (Signed)
Physician Discharge Summary  [name] HYQ:[id number] DOB: January 20, 1961 DOA: 07/27/2012  PCP: [name], NP  Admit date: 07/27/2012 Discharge date: 07/29/2012  Time spent: >90minutes  Recommendations for Outpatient Follow-up:      Follow-up Information   Follow up with [name], NP. (in 1-2weeks, call for appt upon discharge)       Please follow up. (EEG -case manager tocall in am to set up for outpt EEG)       Follow up with Lesly Dukes, MD. (in 2-4weeks, call for appt upon discharge)    Contact information:   [address] [place] [address] [phone]      Follow up studies: NEEDS EEG- To be done as outpt, and follow up with PCP for results   Discharge Diagnoses:  Principal Problem:   TIA (transient ischemic attack) Active Problems:   Obesity   Hypertension   Cardiomyopathy   Gastroesophageal reflux disease   Hypothyroidism   Tobacco abuse   Hyperlipidemia   ICD -St.Jude   Left-sided weakness   Discharge Condition: improved/stable  Diet recommendation: heart healthy  Filed Weights   07/27/12 1838  Weight: 124.2 kg (273 lb 13 oz)    History of present illness:  Melanie Cordova is a very pleasant 52 y.o. female with multiple medical problems including CAD s/p defib, cardiomyopathy, obesity, recent presumed stroke with residual left sided weakness presents to ED from OP rehab at [facility] with cc worsening left sided weakness. Information obtained from pt. She reports riding stationary bike and developing sudden pain in back of head with blurred vision and numbness in her throat and left hand and left leg. Associated symptoms include tingling of left jaw and numbness left side of face. In addition she developed slurred speech. She reports sensation of a "hand on my chest". Denies sob, diaphoresis, nausea. Denies difficulty swallowing. Of note pt discharged from hospital 2 weeks ago with TIA. In ED CT yields no acute findings and  chest xray without acute abnormality. Unable to get MRI due to ICD. TRH asked to admit   Hospital Course:  Left-sided weakness: acute on chronic, pt recently hospitalized for TIA, S/P with recent echo, carotid doppler and so they were not repeated.  -TEE performed on 07/11/2012 - ejection fraction 25-30% - no cardiac source of emboli identified.  Carotid Doppler 07/09/12 - No significant extracranial carotid artery stenosis demonstrated. Vertebrals are patent with antegrade flow. -she was maintained on ASA -repeat CT on this admission was neg, MRI Could not be done due to ICD  -neuro was consulted and saw pt and recommended an EEG  -PT/OT saw pt and did not recommend any f/u therapies -ST recommended outpt SLP and this was set up -on Neuro follow up EEG was not done yet on 5/18 and pt was clinically improved and Dr Anne Hahn stated that EEG could be done oupt, and pt dc/ed and this was done, and She was set up for oupt follow up. Active Problems:  Hypertension:  -ok control, continue home meds. Per recent card note, BP tends to remain low with medical therapy for cardiomyopathy and congestive heart failure. She was to follow up with her oupt MDs on discharge.  Cardiomyopathy:  - she had no evidence of fluid overload in the hospital. It was noted that Per recent cards note pt with severely impaired ventricular systolic function but well compensated.  Gastroesophageal reflux disease:  - Continue home med  Hypothyroidism: continue synthroid  Tobacco abuse:  -counseled  to stop on admission.  Hyperlipidemia:  - Continue statin  ICD -St.Jude      Procedures:  EEG- To be done as outpt  Consultations:  Neuro  Discharge Exam: Filed Vitals:   07/29/12 0900 07/29/12 0942 07/29/12 1330 07/29/12 1335  BP: 96/63 91/50 94/46  125/68  Pulse: 69 92 72   Temp: 98.2 F (36.8 C)  97.7 F (36.5 C)   TempSrc: Oral  Oral   Resp: 18  18   Height:      Weight:      SpO2: 97% 96%  96%    Exam:   General: pt sitting up in chair, alert and oriented x3, in NAD  Cardiovascular: RRR, nl S1S2  Respiratory: CTAB  Abdomen: soft +BS NT/ND  Extremities: no cyanosis , no edema   Discharge Instructions   Future Appointments Provider Department Dept Phone   07/30/2012 10:15 AM Harlon Ditty, PTA [place] [facility] [phone]   08/01/2012 11:00 AM Harlon Ditty, PTA [facility] [phone]   08/03/2012 8:00 AM Bella Kennedy, PT [place] [facility] [phone]   08/08/2012 8:45 AM Amy Burnard [facility] [phone]   08/10/2012 10:15 AM Juel Burrow, PTA  [facility] REHABIL[facility] [phone]       Medication List    ASK your doctor about these medications       albuterol 108 (90 BASE) MCG/ACT inhaler  Commonly known as:  PROVENTIL HFA;VENTOLIN HFA  Inhale 2 puffs into the lungs every 6 (six) hours as needed for wheezing or shortness of breath.     aspirin 325 MG tablet  Take 1 tablet (325 mg total) by mouth daily.     budesonide-formoterol 80-4.5 MCG/ACT inhaler  Commonly known as:  SYMBICORT  Inhale 2 puffs into the lungs 2 (two) times daily.     carvedilol 25 MG tablet  Commonly known as:  COREG  Take 25 mg by mouth 2 (two) times daily with a meal.     CENTRUM SILVER ADULT 50+ PO  Take by mouth. DAILY     diclofenac sodium 1 % Gel  Commonly known as:  VOLTAREN  Apply 2 g topically 4 (four) times daily.     fish oil-omega-3 fatty acids 1000 MG capsule  Take 1 g by mouth daily.     furosemide 40 MG tablet  Commonly known as:  LASIX  Take 1 tablet (40 mg total) by mouth 2 (two) times daily.     gabapentin 300 MG capsule  Commonly known as:  NEURONTIN  Take 300 mg by mouth at bedtime.     IRON SUPPLEMENT 325 (65 FE) MG tablet  Generic drug:  ferrous sulfate  Take 325 mg by mouth daily with breakfast.     levothyroxine 150 MCG tablet  Commonly  known as:  SYNTHROID, LEVOTHROID  Take 300 mcg by mouth daily.     lisinopril 10 MG tablet  Commonly known as:  PRINIVIL,ZESTRIL  Take 1 tablet (10 mg total) by mouth daily.     loratadine 10 MG tablet  Commonly known as:  CLARITIN  Take 1 tablet (10 mg total) by mouth daily.     lovastatin 40 MG tablet  Commonly known as:  MEVACOR  Take 1 tablet (40 mg total) by mouth at bedtime.     potassium chloride SA 20 MEQ tablet  Commonly known as:  K-DUR,KLOR-CON  Take 2 tablets (40 mEq total) by mouth 2 (two) times  daily.     ranitidine 150 MG tablet  Commonly known as:  ZANTAC  Take 150 mg by mouth 2 (two) times daily as needed. Heart Burn       Allergies  Allergen Reactions  . Fish Allergy Anaphylaxis  . Lentil Anaphylaxis  . Penicillins Anaphylaxis and Shortness Of Breath    Hair loss  . Iodinated Diagnostic Agents Hives and Other (See Comments)    Pulmonary problems; no frank respiratory arrest  . Iodine Hives    Pulmonary Problems   . Lipitor (Atorvastatin)     Rash per pt.  . Sulfa Antibiotics Other (See Comments)    Unknown  . Spironolactone Rash       Follow-up Information   Follow up with [name], NP. (in 1-2weeks, call for appt upon discharge)       Please follow up. (EEG -case manager tocall in am to set up for outpt EEG)       Follow up with Lesly Dukes, MD. (in 2-4weeks, call for appt upon discharge)    Contact information:   [address] [place] [address] [phone]        The results of significant diagnostics from this hospitalization (including imaging, microbiology, ancillary and laboratory) are listed below for reference.    Significant Diagnostic Studies: Dg Chest 1 View  07/27/2012   *RADIOLOGY REPORT*  Clinical Data: Code stroke  CHEST - 1 VIEW  Comparison: 07/08/2012.  Findings: AICD in place entering from the left.  Lead unchanged in position.  Heart appears slightly enlarged.  This may reflect result of  AP magnification.  Central pulmonary vascular prominence. No infiltrate, congestive heart failure or pneumothorax.  IMPRESSION: No acute abnormality.  Please see above.   Original Report Authenticated By: Lacy Duverney, M.D.   Ct Head Wo Contrast  07/27/2012   *RADIOLOGY REPORT*  Clinical Data: Code stroke, weakness  CT HEAD WITHOUT CONTRAST  Technique:  Contiguous axial images were obtained from the base of the skull through the vertex without contrast.  Comparison: 07/10/2012  Findings: Few motion artifacts, for which repeat imaging was performed. Normal ventricular morphology. No midline shift or mass effect. Grossly normal appearance of brain parenchyma. No intracranial hemorrhage, mass lesion or evidence of acute infarction. No extra-axial fluid collections. Bones sinuses unremarkable.  IMPRESSION: No acute intracranial abnormalities.  Findings called to Dr. Preston Fleeting on 07/27/2012 at 1045 hours.   Original Report Authenticated By: Ulyses Southward, M.D.   Ct Head Wo Contrast  07/10/2012   *RADIOLOGY REPORT*  Clinical Data:  Stroke.  Lightheaded.  CT HEAD WITHOUT CONTRAST  Technique:  Contiguous axial images were obtained from the base of the skull through the vertex without contrast  Comparison:  CT head 07/09/2012  Findings:  The brain has a normal appearance without evidence for hemorrhage, acute infarction, hydrocephalus, or mass lesion.  There is no extra axial fluid collection.  The skull and paranasal sinuses are normal.  IMPRESSION: Normal CT of the head without contrast.   Original Report Authenticated By: Janeece Riggers, M.D.   Ct Head Without Contrast  07/09/2012   *RADIOLOGY REPORT*  Clinical Data: The patient came to the Hospital yesterday with stroke-like symptoms with head pain and trouble speaking and left leg dragging and left arm weakness.  Improve this morning.  CT HEAD WITHOUT CONTRAST  Technique:  Contiguous axial images were obtained from the base of the skull through the vertex without  contrast.  Comparison: 07/08/2012  Findings: No change from  the prior study.  The ventricles are normal in size and configuration.  There are no parenchymal masses mass effect, no areas of abnormal parenchymal attenuation and no extra-axial masses or abnormal fluid collections.  There is no evidence of a recent infarct and there is no intracranial hemorrhage.  The sinuses and mastoid air cells are clear.  IMPRESSION: Normal unenhanced head CT.   Original Report Authenticated By: Amie Portland, M.D.   Ct Head Wo Contrast  07/08/2012   *RADIOLOGY REPORT*  Clinical Data: 52 year old female with left-sided weakness and slurred speech.  CT HEAD WITHOUT CONTRAST  Technique:  Contiguous axial images were obtained from the base of the skull through the vertex without contrast.  Comparison: 05/11/2012 and prior CTs.  Findings: No intracranial abnormalities are identified, including mass lesion or mass effect, hydrocephalus, extra-axial fluid collection, midline shift, hemorrhage, or acute infarction.  The visualized bony calvarium is unremarkable.  IMPRESSION: Unremarkable noncontrast head CT.  Critical Value/emergent results were called by telephone at the time of interpretation on 07/08/2012 at 1:53 p.m. to Dr. Judd Lien, who verbally acknowledged these results.   Original Report Authenticated By: Harmon Pier, M.D.   Dg Chest Portable 1 View  07/08/2012   *RADIOLOGY REPORT*  Clinical Data: Code stroke  PORTABLE CHEST - 1 VIEW  Comparison: 03/20/2012  Findings: Cardiac enlargement without heart failure.  Single lead pacemaker unchanged.  Negative for heart failure.  Negative for mass or pneumonia.  IMPRESSION: No acute cardiopulmonary abnormality.   Original Report Authenticated By: Janeece Riggers, M.D.    Microbiology: No results found for this or any previous visit (from the past 240 hour(s)).   Labs: Basic Metabolic Panel:  Recent Labs Lab 07/27/12 1051 07/27/12 2055 07/29/12 0605  NA 136  --  136  K 4.6  --   4.1  CL 99  --  100  CO2 27  --  26  GLUCOSE 96  --  90  BUN 14  --  12  CREATININE 0.89 0.92 0.75  CALCIUM 9.3  --  9.1   Liver Function Tests:  Recent Labs Lab 07/27/12 1051  AST 24  ALT 25  ALKPHOS 103  BILITOT 0.5  PROT 7.4  ALBUMIN 3.8   No results found for this basename: LIPASE, AMYLASE,  in the last 168 hours No results found for this basename: AMMONIA,  in the last 168 hours CBC:  Recent Labs Lab 07/27/12 1051 07/27/12 2055 07/29/12 1002  WBC 12.0* 10.4 8.5  NEUTROABS 8.1*  --   --   HGB 15.4* 14.6 14.1  HCT 46.4* 43.9 42.1  MCV 90.1 88.3 87.9  PLT 274 257 235   Cardiac Enzymes:  Recent Labs Lab 07/27/12 1051  TROPONINI <0.30   BNP: BNP (last 3 results) No results found for this basename: PROBNP,  in the last 8760 hours CBG:  Recent Labs Lab 07/27/12 1048  GLUCAP 98       Signed:  Zayvion Stailey C  [facility] 07/29/2012, 2:29 PM

## 2012-07-29 NOTE — Progress Notes (Signed)
Stroke Team Progress Note  HISTORY [place] Melanie Cordova is a 52 y.o. female a history of hypertension, hyperlipidemia, obesity, cardiomyopathy, congestive heart failure, and implanted cardiac defibrillator who experienced weakness involving left side as well as speech output difficulty at about 10:10 AM 07/27/12. Patient was exercising at rehabilitation at the time. She also had a pressure sensation in her chest as well as back of her neck. She was hospitalized here about 2-1/2-3 weeks ago for similar presentation thought to likely be a TIA. CT scan of her head showed no signs of stroke. Repeat CT 07/27/12 also showed no indication of acute nor recent stroke. Transesophageal echocardiogram showed no embolus source. Aspirin was recommended at the time of discharge. Patient did not start taking aspirin until the day prior to admission, however. Strength of the left side improved as had her speech output. She was initially seen in the emergency room at [facility] and subsequently transferred to [facility]  LSN: 10:10 AM on 07/27/2012  tPA Given: No: Beyond time window for treatment consideration, as well as resolving deficits.  mRankin: Space 1  SUBJECTIVE No family members present this morning. The patient states that she feels fine except a little pressure in the front of her head and face which she attributes to sinus discomfort. She also reports that she has had some bright red blood streaking of her stools which has concerned her.  OBJECTIVE Most recent Vital Signs: Filed Vitals:   07/28/12 2106 07/28/12 2148 07/29/12 0200 07/29/12 0600  BP:  84/54 103/75 105/78  Pulse:  79 75 58  Temp:  98.3 F (36.8 C) 97.5 F (36.4 C) 97.3 F (36.3 C)  TempSrc:  Oral Oral Oral  Resp:  18 18 18   Height:      Weight:      SpO2: 97% 96% 97% 98%   CBG (last 3)   Recent Labs  07/27/12 1048  GLUCAP 98    IV Fluid Intake:     MEDICATIONS  . aspirin  325 mg Oral Daily  .  budesonide-formoterol  2 puff Inhalation BID  . carvedilol  25 mg Oral BID WC  . enoxaparin (LOVENOX) injection  40 mg Subcutaneous Q24H  . famotidine  20 mg Oral BID  . ferrous sulfate  325 mg Oral Q breakfast  . furosemide  40 mg Oral BID  . gabapentin  300 mg Oral QHS  . levothyroxine  300 mcg Oral QAC breakfast  . lisinopril  10 mg Oral Daily  . loratadine  10 mg Oral Daily  . potassium chloride SA  40 mEq Oral BID  . simvastatin  20 mg Oral q1800   PRN:  acetaminophen, acetaminophen, albuterol, ondansetron (ZOFRAN) IV  Diet:  Cardiac thin liquids Activity:  Up with assistance DVT Prophylaxis:  Lovenox   CLINICALLY SIGNIFICANT STUDIES Basic Metabolic Panel:   Recent Labs Lab 07/27/12 1051 07/27/12 2055 07/29/12 0605  NA 136  --  136  K 4.6  --  4.1  CL 99  --  100  CO2 27  --  26  GLUCOSE 96  --  90  BUN 14  --  12  CREATININE 0.89 0.92 0.75  CALCIUM 9.3  --  9.1   Liver Function Tests:   Recent Labs Lab 07/27/12 1051  AST 24  ALT 25  ALKPHOS 103  BILITOT 0.5  PROT 7.4  ALBUMIN 3.8   CBC:   Recent Labs Lab 07/27/12 1051 07/27/12 2055  WBC 12.0* 10.4  NEUTROABS 8.1*  --   HGB 15.4* 14.6  HCT 46.4* 43.9  MCV 90.1 88.3  PLT 274 257   Coagulation:   Recent Labs Lab 07/27/12 1051  LABPROT 12.8  INR 0.97   Cardiac Enzymes:   Recent Labs Lab 07/27/12 1051  TROPONINI <0.30   Urinalysis:   Recent Labs Lab 07/27/12 1121  COLORURINE YELLOW  LABSPEC 1.020  PHURINE 5.5  GLUCOSEU NEGATIVE  HGBUR NEGATIVE  BILIRUBINUR NEGATIVE  KETONESUR NEGATIVE  PROTEINUR NEGATIVE  UROBILINOGEN 0.2  NITRITE NEGATIVE  LEUKOCYTESUR NEGATIVE   Lipid Panel    Component Value Date/Time   CHOL 173 07/09/2012 0120   TRIG 159* 07/09/2012 0120   HDL 32* 07/09/2012 0120   CHOLHDL 5.4 07/09/2012 0120   VLDL 32 07/09/2012 0120   LDLCALC 109* 07/09/2012 0120   HgbA1C  Lab Results  Component Value Date   HGBA1C 5.6 07/08/2012    Urine Drug Screen:      Component Value Date/Time   LABOPIA NONE DETECTED 07/27/2012 1121   COCAINSCRNUR NONE DETECTED 07/27/2012 1121   LABBENZ NONE DETECTED 07/27/2012 1121   AMPHETMU POSITIVE* 07/27/2012 1121   THCU NONE DETECTED 07/27/2012 1121   LABBARB NONE DETECTED 07/27/2012 1121    Alcohol Level:   Recent Labs Lab 07/27/12 1051  ETH <11    Dg Chest 1 View 07/27/2012 AICD in place entering from the left. No acute abnormality.     Ct Head Wo Contrast  07/27/2012    No acute intracranial abnormalities.    :  MRI of the brain  unable to perform secondary to AICD  TEE performed on 07/11/2012 - ejection fraction 25-30% - no cardiac source of emboli identified.  Carotid Doppler  07/09/12 - No significant extracranial carotid artery stenosis demonstrated. Vertebrals are patent with antegrade flow.  EKG  . Sinus rhythm rate 79 beats per minute.  Therapy Recommendations - no followup therapy was recommended.  Physical Exam   General - 52 year old female in bed in no acute distress. Heart - Regular rate and rhythm - distant heart sounds. Lungs - Clear to auscultation Abdomen - Soft - non tender Extremities - Distal pulses weak but intact - trace edema Skin - Warm and dry  NEUROLOGIC:   MENTAL STATUS: awake, alert, oriented, less dysarthria,  follows simple commands.  CRANIAL NERVES: pupils equal and reactive to light,extraocular muscles intact, facial sensation and strength symmetric, uvula midlinec, tongue midline MOTOR: normal bulk and tone, Strength -over 5 on the right - 4+ over 5 both upper and lower left extremities. SENSORY: normal and symmetric to light touch  COORDINATION: finger-nose-finger normal     ASSESSMENT Melanie Cordova is a 52 y.o. female presenting with left hemiparesis and  aphasia. T-PA was not given secondary to resolving symptoms and being out of the therapeutic window. A CT scan of the head showed no acute abnormalities. An MRI cannot be performed secondary to  an AICD.  On aspirin 325 mg orally every day prior to admission, but she was not taking. Now on aspirin 325 mg orally every day for secondary stroke prevention. Patient with resultant mild residual left hemiparesis and mild dysarthria. Work up underway.   Positive drug screen for amphetamines. The patient denies use of illicit drugs, but no amphetamines on home med list  Cardiomyopathy with implantable defibrillator  Hyperlipidemia - Zocor prior to admission.  Hypertension history  Previous CVA by history.  Bright red blood streaking of stools - Will check [facility]  day # 2  The patient indicates a history of seizures from age 32, with the last seizure occurring at age 52. The patient was on phenobarbital and Dilantin in the past, currently she is on no antiepileptic medications. Given this history, EEG has been ordered.  TREATMENT/PLAN  Continue aspirin 325 mg orally every day for secondary stroke prevention.  No followup therapy recommended  EEG ordered, may be done as an outpatient.  Check CBC.  Disposition - possible discharge today  Delton See PA-C [facility] Pager [phone] 07/29/2012, 9:00 AM  I have personally obtained a history, examined the patient, evaluated imaging results, and formulated the assessment and plan of care. I agree with the above.  Lesly Dukes

## 2012-07-29 NOTE — Progress Notes (Signed)
Physical Therapy Treatment Patient Details Name: Melanie Cordova MRN: [id number] DOB: 1960-10-09 Today's Date: 07/29/2012 Time: 7829-5621 PT Time Calculation (min): 14 min  PT Assessment / Plan / Recommendation Comments on Treatment Session  Patient is independent with mobility and gait.  Did well on stairs.  Has achieved all PT goals - will sign off.    Follow Up Recommendations  No PT follow up;Supervision - Intermittent     Does the patient have the potential to tolerate intense rehabilitation     Barriers to Discharge        Equipment Recommendations  None recommended by PT    Recommendations for Other Services    Frequency     Plan Discharge plan remains appropriate    Precautions / Restrictions Precautions Precautions: None Restrictions Weight Bearing Restrictions: No   Pertinent Vitals/Pain     Mobility  Bed Mobility Bed Mobility: Supine to Sit;Sit to Supine Supine to Sit: 7: Independent Sit to Supine: 7: Independent Details for Bed Mobility Assistance: No cues or assistance needed Transfers Transfers: Sit to Stand;Stand to Sit Sit to Stand: 7: Independent;From bed;From toilet Stand to Sit: 7: Independent;To toilet;To bed Details for Transfer Assistance: No cues or assist needed Ambulation/Gait Ambulation/Gait Assistance: 7: Independent Ambulation Distance (Feet): 400 Feet Assistive device: None Ambulation/Gait Assistance Details: Good gait pattern, balance, and velocity. Gait Pattern: Within Functional Limits Gait velocity: WFL Stairs: Yes Stairs Assistance: 7: Independent Stairs Assistance Details (indicate cue type and reason): Instructed patient on step-to and step-over-step techniques.  States more comfortable with step-over-step. Stair Management Technique: One rail Right;Alternating pattern;Forwards Number of Stairs: 10      PT Goals Acute Rehab PT Goals Pt will Ambulate: >150 feet;Independently PT Goal: Ambulate - Progress: Met Pt will  Go Up / Down Stairs: 6-9 stairs;with modified independence;with rail(s);with least restrictive assistive device PT Goal: Up/Down Stairs - Progress: Met  Visit Information  Last PT Received On: 07/29/12 Assistance Needed: +1    Subjective Data  Subjective: Patient without complaints.   Cognition  Cognition Arousal/Alertness: Awake/alert Behavior During Therapy: WFL for tasks assessed/performed Overall Cognitive Status: Within Functional Limits for tasks assessed    Balance     End of Session PT - End of Session Equipment Utilized During Treatment: Gait belt Activity Tolerance: Patient tolerated treatment well (Dyspnea 1/4 today) Patient left: in bed;with call bell/phone within reach Nurse Communication: Mobility status (Encouraged ambulation with nursing)   GP     Vena Austria 07/29/2012, 9:12 AM Durenda Hurt. Renaldo Fiddler, [facility] Acute Rehab Services Pager [phone]

## 2012-07-29 NOTE — Progress Notes (Signed)
Occupational Therapy Note   Received OT order.  OT eval completed 07/28/12 with no f/u recommended in acute setting. Will sign off.  07/29/2012 Cipriano Mile OTR/L Pager [phone] Office [phone]

## 2012-07-30 ENCOUNTER — Emergency Department (HOSPITAL_COMMUNITY)
Admission: EM | Admit: 2012-07-30 | Discharge: 2012-07-31 | Disposition: A | Discharge status: Home / Self Care | Payer: Medicaid Other | Attending: Emergency Medicine | Admitting: Emergency Medicine

## 2012-07-30 ENCOUNTER — Other Ambulatory Visit: Payer: Self-pay

## 2012-07-30 ENCOUNTER — Ambulatory Visit (HOSPITAL_COMMUNITY)
Admission: RE | Admit: 2012-07-30 | Discharge: 2012-07-30 | Disposition: A | Discharge status: Home / Self Care | Payer: Medicaid Other | Source: Ambulatory Visit | Attending: Internal Medicine | Admitting: Internal Medicine

## 2012-07-30 ENCOUNTER — Encounter (HOSPITAL_COMMUNITY): Payer: Self-pay | Admitting: Emergency Medicine

## 2012-07-30 DIAGNOSIS — Z7982 Long term (current) use of aspirin: Secondary | ICD-10-CM | POA: Insufficient documentation

## 2012-07-30 DIAGNOSIS — J45909 Unspecified asthma, uncomplicated: Secondary | ICD-10-CM | POA: Insufficient documentation

## 2012-07-30 DIAGNOSIS — G40909 Epilepsy, unspecified, not intractable, without status epilepticus: Secondary | ICD-10-CM | POA: Insufficient documentation

## 2012-07-30 DIAGNOSIS — Z8701 Personal history of pneumonia (recurrent): Secondary | ICD-10-CM | POA: Insufficient documentation

## 2012-07-30 DIAGNOSIS — Z8669 Personal history of other diseases of the nervous system and sense organs: Secondary | ICD-10-CM | POA: Insufficient documentation

## 2012-07-30 DIAGNOSIS — Z8709 Personal history of other diseases of the respiratory system: Secondary | ICD-10-CM | POA: Insufficient documentation

## 2012-07-30 DIAGNOSIS — F411 Generalized anxiety disorder: Secondary | ICD-10-CM | POA: Insufficient documentation

## 2012-07-30 DIAGNOSIS — I10 Essential (primary) hypertension: Secondary | ICD-10-CM | POA: Insufficient documentation

## 2012-07-30 DIAGNOSIS — E785 Hyperlipidemia, unspecified: Secondary | ICD-10-CM | POA: Insufficient documentation

## 2012-07-30 DIAGNOSIS — M129 Arthropathy, unspecified: Secondary | ICD-10-CM | POA: Insufficient documentation

## 2012-07-30 DIAGNOSIS — K3189 Other diseases of stomach and duodenum: Secondary | ICD-10-CM | POA: Insufficient documentation

## 2012-07-30 DIAGNOSIS — K30 Functional dyspepsia: Secondary | ICD-10-CM

## 2012-07-30 DIAGNOSIS — Z8673 Personal history of transient ischemic attack (TIA), and cerebral infarction without residual deficits: Secondary | ICD-10-CM | POA: Insufficient documentation

## 2012-07-30 DIAGNOSIS — Z8679 Personal history of other diseases of the circulatory system: Secondary | ICD-10-CM | POA: Insufficient documentation

## 2012-07-30 DIAGNOSIS — R1013 Epigastric pain: Secondary | ICD-10-CM | POA: Insufficient documentation

## 2012-07-30 DIAGNOSIS — E669 Obesity, unspecified: Secondary | ICD-10-CM | POA: Insufficient documentation

## 2012-07-30 DIAGNOSIS — E039 Hypothyroidism, unspecified: Secondary | ICD-10-CM | POA: Insufficient documentation

## 2012-07-30 DIAGNOSIS — Z880 Allergy status to penicillin: Secondary | ICD-10-CM | POA: Insufficient documentation

## 2012-07-30 DIAGNOSIS — Z9889 Other specified postprocedural states: Secondary | ICD-10-CM | POA: Insufficient documentation

## 2012-07-30 DIAGNOSIS — Z79899 Other long term (current) drug therapy: Secondary | ICD-10-CM | POA: Insufficient documentation

## 2012-07-30 DIAGNOSIS — F17200 Nicotine dependence, unspecified, uncomplicated: Secondary | ICD-10-CM | POA: Insufficient documentation

## 2012-07-30 DIAGNOSIS — Z95810 Presence of automatic (implantable) cardiac defibrillator: Secondary | ICD-10-CM | POA: Insufficient documentation

## 2012-07-30 DIAGNOSIS — I5022 Chronic systolic (congestive) heart failure: Secondary | ICD-10-CM | POA: Insufficient documentation

## 2012-07-30 HISTORY — DX: Cerebral infarction, unspecified: I63.9

## 2012-07-30 MED ORDER — PANTOPRAZOLE SODIUM 40 MG IV SOLR: 40.0000 mg | Freq: Once | INTRAVENOUS | Status: DC

## 2012-07-30 MED ORDER — FAMOTIDINE 20 MG PO TABS
20.0000 mg | ORAL_TABLET | Freq: Once | ORAL | Status: AC
  Administered 2012-07-31: 20 mg via ORAL
  Filled 2012-07-30: qty 1

## 2012-07-30 NOTE — ED Notes (Signed)
Patient complaining of "burning" in her chest area starting tonight after eating supper.

## 2012-07-30 NOTE — Evaluation (Signed)
Physical Therapy Re-evaluation  Patient Details  Name: Melanie Cordova MRN: [id number] Date of Birth: 02/14/1961  Today's Date: 07/30/2012 Time: 4696-2952 PT Time Calculation (min): 40 min              Visit#: 4 of 12  Re-eval: 08/22/12 Charges: MMT x 1 PPT x 10' Self care x 10' Therex x 10'  Authorization: medicaid    Authorization Visit#: 4 of 10   Past Medical History:  Past Medical History  Diagnosis Date  . Hypertension     09/2010-normal CMet and CBC; Lipid profile-116, 88, 25, 73  . Cardiomyopathy 08/2010    Presented with congestive heart failure; EF of 15% and 2012; hypotension on medication precludes optimal dosing  . Gastroesophageal reflux disease   . Hypothyroidism     Recent TSH was normal.  . Pneumonia   . Obesity   . Hyperlipidemia   . Tobacco abuse     20 pack years  . CHF (congestive heart failure)   . Asthma   . Shortness of breath   . Seizures   . Headache   . Arthritis   . Anxiety   . Neuropathy   . 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 Chronic systolic heart failure    Past Surgical History:  Past Surgical History  Procedure Laterality Date  . Cesarean section      X2  . Tee without cardioversion N/A 07/11/2012    Procedure: TRANSESOPHAGEAL ECHOCARDIOGRAM (TEE);  Surgeon: Vesta Mixer, MD;  Location: [facility] ENDOSCOPY;  Service: Cardiovascular;  Laterality: N/A;    Subjective Symptoms/Limitations Symptoms: Pt states that she was admitted to [facility] on 07/27/12 and was discharged on 07/29/12(yesterday). Pt states that she has a TIA and her meds were adjusted. Pain Assessment Currently in Pain?: No/denies    Assessment LUE Strength Left Shoulder Flexion: 4/5 (was 3+/5) Left Shoulder Extension: 4/5 (was 4/5) Left Shoulder ABduction: 4/5 (was 4/5) Left Shoulder Internal Rotation: 4/5 (was 4/5) Left Shoulder External Rotation: 4/5 (was 4/5) Left Elbow Flexion: 5/5 (was 4+/5) Left Elbow Extension: 5/5 (was  4/5) Grip (lbs): 45 (was 38) LLE Strength Left Hip Flexion: 4/5 (was 3-/5) Left Hip Extension: 3+/5 (was 3-/5) Left Hip ABduction: 3+/5 (was 3/5) Left Knee Flexion: 5/5 (was 3/5) Left Knee Extension:  (4+/5 was 4-/5) Left Ankle Dorsiflexion: 5/5 (was 3/5)  Exercise/Treatments Mobility/Balance  Berg Balance Test Standing Unsupported: Able to stand safely 2 minutes Sitting with Back Unsupported but Feet Supported on Floor or Stool: Able to sit safely and securely 2 minutes Stand to Sit: Sits safely with minimal use of hands Transfers: Able to transfer safely, minor use of hands Standing Unsupported with Eyes Closed: Able to stand 10 seconds safely Standing Ubsupported with Feet Together: Able to place feet together independently and stand 1 minute safely From Standing, Reach Forward with Outstretched Arm: Can reach forward >12 cm safely (5") From Standing Position, Pick up Object from Floor: Able to pick up shoe safely and easily From Standing Position, Turn to Look Behind Over each Shoulder: Looks behind from both sides and weight shifts well Turn 360 Degrees: Able to turn 360 degrees safely one side only in 4 seconds or less Standing Unsupported, Alternately Place Feet on Step/Stool: Able to stand independently and safely and complete 8 steps in 20 seconds Standing Unsupported, One Foot in Front: Able to plae foot ahead of the other independently and hold 30 seconds Standing on One Leg: Able to lift leg independently  and hold 5-10 seconds   Aerobic Stationary Bike: Nustep L1 x 10 minutes for activity tolerance  Physical Therapy Assessment and Plan PT Assessment and Plan Clinical Impression Statement: Re-evaluation completed this session secondary to hospitalization. Pt states that she was diagnosed with TIA caused by uncontrolled blood pressure.  Despite hospitalization pt has improved in all areas. Pt appears to progressing well toward goals.  PT Plan: Continue per initial POC.     Goals Home Exercise Program Pt will Perform Home Exercise Program: Independently PT Short Term Goals Time to Complete Short Term Goals: 2 weeks PT Short Term Goal 1: Pt to be able to stand for 10 minutes to be able to do dishes without sitting PT Short Term Goal 1 - Progress: Progressing toward goal PT Short Term Goal 2: Pt to be able to walk for 10 minutes without stopping to be able to go into store for 1-3 items PT Short Term Goal 2 - Progress: Progressing toward goal PT Long Term Goals Time to Complete Long Term Goals: 4 weeks PT Long Term Goal 1: Pt to be able to stand for 15 minutes to make a small meal PT Long Term Goal 1 - Progress: Progressing toward goal PT Long Term Goal 2: Pt to be able to walk for 15-20 minutes for exercise for good health PT Long Term Goal 2 - Progress: Progressing toward goal Long Term Goal 3: Pt to be able to grasp item without dropping them. Long Term Goal 3 Progress: Progressing toward goal Long Term Goal 4: Berg to be improved 10 pts to reduce risk of falling Long Term Goal 4 Progress: Progressing toward goal  Problem List Patient Active Problem List   Diagnosis Date Noted  . Left-sided weakness 07/27/2012  . Gingival disease 07/25/2012  . TIA (transient ischemic attack) 11/07/2011  . 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 Hypokalemia 10/01/2011  . Tobacco abuse   . Hyperlipidemia   . Hypertension   . Gastroesophageal reflux disease   . Hypothyroidism   . Obesity 09/30/2010  . Cardiomyopathy 08/13/2010    PT - End of Session Equipment Utilized During Treatment: Gait belt Activity Tolerance: Patient limited by fatigue;Patient tolerated treatment well General Behavior During Therapy: [facility] for tasks assessed/performed Cognition: WFL for tasks performed  Seth Bake, PTA  07/30/2012, 11:03 AM  Physician Documentation Your signature is required to indicate approval of the treatment plan as stated above.  Please sign and either send  electronically or make a copy of this report for your files and return this physician signed original.   Please mark one 1.__approve of plan  2. ___approve of plan with the following conditions.   ______________________________                                                          _____________________ Physician Signature  Date  

## 2012-07-31 MED ORDER — PANTOPRAZOLE SODIUM 40 MG PO TBEC
40.0000 mg | DELAYED_RELEASE_TABLET | Freq: Once | ORAL | Status: AC
  Administered 2012-07-31: 40 mg via ORAL
  Filled 2012-07-31: qty 1

## 2012-07-31 NOTE — ED Provider Notes (Signed)
History     CSN: [id number]  Arrival date & time 07/30/12  2313   First MD Initiated Contact with Patient 07/30/12 2337      Chief Complaint  Patient presents with  . Chest Pain    (Consider location/radiation/quality/duration/timing/severity/associated sxs/prior treatment) HPI HPI Comments: Melanie Cordova is a 52 y.o. female brought in by ambulance, who presents to the Emergency Department complaining of epigastric burning that began after eating  Supper tonight. The burning has continued in the central chest. She has taken no medicines. Denies nausea, vomiting.   PCP [name] Past Medical History  Diagnosis Date  . Hypertension     09/2010-normal CMet and CBC; Lipid profile-116, 88, 25, 73  . Cardiomyopathy 08/2010    Presented with congestive heart failure; EF of 15% and 2012; hypotension on medication precludes optimal dosing  . Gastroesophageal reflux disease   . Hypothyroidism     Recent TSH was normal.  . Pneumonia   . Obesity   . Hyperlipidemia   . Tobacco abuse     20 pack years  . CHF (congestive heart failure)   . Asthma   . Shortness of breath   . Seizures   . Headache   . Arthritis   . Anxiety   . Neuropathy   . 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 Chronic systolic heart failure   . Stroke     Past Surgical History  Procedure Laterality Date  . Cesarean section      X2  . Tee without cardioversion N/A 07/11/2012    Procedure: TRANSESOPHAGEAL ECHOCARDIOGRAM (TEE);  Surgeon: Vesta Mixer, MD;  Location: [facility] ENDOSCOPY;  Service: Cardiovascular;  Laterality: N/A;    Family History  Problem Relation Age of Onset  . Cardiomyopathy Mother     ICD pacemaker-ischmic CM  . Heart failure Mother   . Hypertension Mother   . Cardiomyopathy Father     Deceased  . Coronary artery disease Father   . Heart failure Father   . Heart failure Brother   . Hypertension Brother     History  Substance Use Topics  . Smoking  status: Current Every Day Smoker -- 0.50 packs/day for 21 years    Types: Cigarettes  . Smokeless tobacco: Never Used  . Alcohol Use: No    OB History   Grav Para Term Preterm Abortions TAB SAB Ect Mult Living                  Review of Systems  Constitutional: Negative for fever.       10 Systems reviewed and are negative for acute change except as noted in the HPI.  HENT: Negative for congestion.   Eyes: Negative for discharge and redness.  Respiratory: Negative for cough and shortness of breath.   Cardiovascular: Negative for chest pain.  Gastrointestinal: Positive for abdominal pain. Negative for vomiting.  Musculoskeletal: Negative for back pain.  Skin: Negative for rash.  Neurological: Negative for syncope, numbness and headaches.  Psychiatric/Behavioral:       No behavior change.    Allergies  Fish allergy; Lentil; Penicillins; Iodinated diagnostic agents; Iodine; Lipitor; Sulfa antibiotics; and Spironolactone  Home Medications   Current Outpatient Rx  Name  Route  Sig  Dispense  Refill  . albuterol (PROVENTIL HFA;VENTOLIN HFA) 108 (90 BASE) MCG/ACT inhaler   Inhalation   Inhale 2 puffs into the lungs every 6 (six) hours as needed for wheezing or shortness of breath.  1 Inhaler   5   . aspirin 325 MG tablet   Oral   Take 1 tablet (325 mg total) by mouth daily.   30 tablet   5   . budesonide-formoterol (SYMBICORT) 80-4.5 MCG/ACT inhaler   Inhalation   Inhale 2 puffs into the lungs 2 (two) times daily.   1 Inhaler   6   . carvedilol (COREG) 25 MG tablet   Oral   Take 0.5 tablets (12.5 mg total) by mouth 2 (two) times daily with a meal.         . diclofenac sodium (VOLTAREN) 1 % GEL   Topical   Apply 2 g topically 4 (four) times daily.         . ferrous sulfate (IRON SUPPLEMENT) 325 (65 FE) MG tablet   Oral   Take 325 mg by mouth daily with breakfast.         . fish oil-omega-3 fatty acids 1000 MG capsule   Oral   Take 1 g by mouth daily.          . furosemide (LASIX) 40 MG tablet   Oral   Take 1 tablet (40 mg total) by mouth 2 (two) times daily.   60 tablet   6   . gabapentin (NEURONTIN) 300 MG capsule   Oral   Take 300 mg by mouth at bedtime.          Marland Kitchen levothyroxine (SYNTHROID, LEVOTHROID) 150 MCG tablet   Oral   Take 300 mcg by mouth daily.         Marland Kitchen lisinopril (PRINIVIL,ZESTRIL) 10 MG tablet   Oral   Take 1 tablet (10 mg total) by mouth daily.   30 tablet   5   . loratadine (CLARITIN) 10 MG tablet   Oral   Take 1 tablet (10 mg total) by mouth daily.   30 tablet   2   . lovastatin (MEVACOR) 40 MG tablet   Oral   Take 1 tablet (40 mg total) by mouth at bedtime.   30 tablet   6   . Multiple Vitamins-Minerals (CENTRUM SILVER ADULT 50+ PO)   Oral   Take by mouth. DAILY         . potassium chloride SA (K-DUR,KLOR-CON) 20 MEQ tablet   Oral   Take 2 tablets (40 mEq total) by mouth 2 (two) times daily.   120 tablet   6   . ranitidine (ZANTAC) 150 MG tablet   Oral   Take 150 mg by mouth 2 (two) times daily as needed. Heart Burn           BP 118/60  Pulse 82  Temp(Src) 98 F (36.7 C) (Oral)  Resp 23  Ht 5\' 7"  (1.702 m)  Wt 265 lb (120.203 kg)  BMI 41.5 kg/m2  SpO2 96%  Physical Exam  Nursing note and vitals reviewed. Constitutional: She appears well-developed and well-nourished.  Awake, alert, nontoxic appearance.  HENT:  Head: Normocephalic and atraumatic.  Eyes: EOM are normal. Pupils are equal, round, and reactive to light.  Neck: Normal range of motion. Neck supple.  Cardiovascular: Normal rate and intact distal pulses.   Pulmonary/Chest: Effort normal. She exhibits no tenderness.  Abdominal: Soft. Bowel sounds are normal. There is no tenderness. There is no rebound.  Tenderness to epigastric area with palpation  Musculoskeletal: She exhibits no tenderness.  Baseline ROM, no obvious new focal weakness.  Neurological:  Mental status and motor strength appears baseline for  patient  and situation.  Skin: No rash noted.  Psychiatric: She has a normal mood and affect.    ED Course  Procedures (including critical care time)   Date: 07/30/2012     2316  Rate:80  Rhythm: normal sinus rhythm and premature ventricular contractions (PVC)  QRS Axis: normal  Intervals: normal  ST/T Wave abnormalities: nonspecific ST/T changes  Conduction Disutrbances:none  Narrative Interpretation:   Old EKG Reviewed: changes noted c/w 07/25/12 QT no longer prolonged  0117 Patient feeling better.   MDM  Patient presents with burning epigastric pain after eating. Responded well to protonix and pepcid. Pt stable in ED with no significant deterioration in condition.The patient appears reasonably screened and/or stabilized for discharge and I doubt any other medical condition or other [facility] requiring further screening, evaluation, or treatment in the ED at this time prior to discharge.  MDM Reviewed: nursing note and vitals           Nicoletta Dress. Colon Branch, MD 07/31/12 2952

## 2012-08-01 ENCOUNTER — Ambulatory Visit (HOSPITAL_COMMUNITY)
Admission: RE | Admit: 2012-08-01 | Discharge: 2012-08-01 | Disposition: A | Discharge status: Home / Self Care | Payer: Medicaid Other | Source: Ambulatory Visit | Attending: Nurse Practitioner | Admitting: Nurse Practitioner

## 2012-08-01 NOTE — Progress Notes (Signed)
Physical Therapy Treatment Patient Details  Name: Melanie Cordova MRN: [id number] Date of Birth: 02-22-1961  Today's Date: 08/01/2012 Time: 1100-1145 PT Time Calculation (min): 45 min  Visit#: 5 of 12  Re-eval: 08/22/12 Charges: Therex x 15' NMR x 23'  Authorization: medicaid  Authorization Visit#: 5 of 10   Subjective: Symptoms/Limitations Symptoms: Pt is pain free. She states that she is very stressed out over financial issues.  Pain Assessment Currently in Pain?: No/denies   Exercise/Treatments Aerobic Stationary Bike: Nustep hills #3 L2 x 10 minutes for activity tolerance Standing Heel Raises: 10 reps;Limitations Heel Raises Limitations: toe raises 10 Functional Squat: 10 reps Rocker Board: 2 minutes Other Standing Knee Exercises: Tandem gt Retro gait 1 RT; Tandem stance 3 x 30" bilateral Other Standing Knee Exercises: Side steps with red tubing 4,3,2,1 x 2 Seated Other Seated Knee Exercises: sit to stand x 10 w/o UE assistance  Physical Therapy Assessment and Plan PT Assessment and Plan Clinical Impression Statement: Pt tolerates total treatment without seated a seated rest break. Progressed therex and NMR activities with minimal difficulty. Pt has multiple LOB with NMR activities but is able to recover independently.  PT Plan: Continue to progress strength and balance per PT POC.     Problem List Patient Active Problem List   Diagnosis Date Noted  . Left-sided weakness 07/27/2012  . Gingival disease 07/25/2012  . TIA (transient ischemic attack) 11/07/2011  . 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 Hypokalemia 10/01/2011  . Tobacco abuse   . Hyperlipidemia   . Hypertension   . Gastroesophageal reflux disease   . Hypothyroidism   . Obesity 09/30/2010  . Cardiomyopathy 08/13/2010    PT - End of Session Equipment Utilized During Treatment: Gait belt Activity Tolerance: Patient limited by fatigue;Patient tolerated treatment well General Behavior During Therapy: Melanie Cordova  for tasks assessed/performed Cognition: Melanie Cordova for tasks performed  Melanie Cordova, PTA  08/01/2012, 11:59 AM

## 2012-08-02 ENCOUNTER — Ambulatory Visit: Payer: Medicaid Other | Admitting: Radiology

## 2012-08-03 ENCOUNTER — Ambulatory Visit (HOSPITAL_COMMUNITY)
Admission: RE | Admit: 2012-08-03 | Discharge: 2012-08-03 | Disposition: A | Discharge status: Home / Self Care | Payer: Medicaid Other | Source: Ambulatory Visit | Attending: Nurse Practitioner | Admitting: Nurse Practitioner

## 2012-08-03 NOTE — Progress Notes (Signed)
Physical Therapy Treatment Patient Details  Name: Melanie Cordova MRN: [id number] Date of Birth: 24-Nov-1960 There ex [phone]  There ex x 3 Today's Date: 08/03/2012 Time: 3086-5784 PT Time Calculation (min): 42 min  Visit#: 6 of 12  Re-eval: 08/22/12  Authorization:   medicaid Authorization Time Period: 12 visits approved thru 6/11  Authorization Visit#: 6 of 12   Subjective: Symptoms/Limitations Symptoms: Today is a good day. Pain Assessment Pain Score: 0-No pain    Exercise/Treatments    Aerobic Stationary Bike: Nustep hills #3 L2 x 10 minutes for activity tolerance Machines for Strengthening Total Gym Leg Press: 5 PL x 10 reps. Standing Lateral Step Up: Both;10 reps Forward Step Up: Both;10 reps Functional Squat: 5 reps (pick up box off 6" step lift up onto toes) Rocker Board: 2 minutes SLS with Vectors:  (10" x 3 B ) Other Standing Knee Exercises: Tandem gt Retro gait 1 RT; Tandem stance 3 x 30" bilateral Other Standing Knee Exercises: Side steps with red tubing 4,3,2,1 x 2 Seated Other Seated Knee Exercises: sit to stand x 10 w/o UE assistance   Physical Therapy Assessment and Plan PT Assessment and Plan Clinical Impression Statement: Pt improved needing no rest breaks today.  Pt needs verbal cuing to complete exercises with correct technique.   Pt will benefit from skilled therapeutic intervention in order to improve on the following deficits: Decreased activity tolerance;Decreased balance;Decreased strength;Difficulty walking;Obesity PT Plan: continue to progress pt.    Goals Home Exercise Program PT Goal: Perform Home Exercise Program - Progress: Met PT Short Term Goals PT Short Term Goal 1: Pt to be able to stand for 10 minutes to be able to do dishes without sitting PT Short Term Goal 1 - Progress: Progressing toward goal PT Short Term Goal 2: Pt to be able to walk for 10 minutes without stopping to be able to go into store for 1-3 items PT Short Term  Goal 2 - Progress: Progressing toward goal PT Long Term Goals PT Long Term Goal 1: Pt to be able to stand for 15 minutes to make a small meal PT Long Term Goal 1 - Progress: Progressing toward goal PT Long Term Goal 2: Pt to be able to walk for 15-20 minutes for exercise for good health Long Term Goal 3: Pt to be able to grasp item without dropping them. Long Term Goal 4: Berg to be improved 10 pts to reduce risk of falling Long Term Goal 4 Progress: Progressing toward goal  Problem List Patient Active Problem List   Diagnosis Date Noted  . Gingival disease 07/25/2012  . TIA (transient ischemic attack) 11/07/2011  . 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 Hypokalemia 10/01/2011  . Tobacco abuse   . Hyperlipidemia   . Hypertension   . Gastroesophageal reflux disease   . Hypothyroidism   . Obesity 09/30/2010  . Cardiomyopathy 08/13/2010    PT - End of Session Activity Tolerance: Patient tolerated treatment well General Behavior During Therapy: WFL for tasks assessed/performed Cognition: WFL for tasks performed  GP Functional Assessment Tool Used: Clinical judgement  Manie Bealer,CINDY 08/03/2012, 9:37 AM

## 2012-08-07 ENCOUNTER — Telehealth: Payer: Self-pay | Admitting: Neurology

## 2012-08-07 ENCOUNTER — Ambulatory Visit (INDEPENDENT_AMBULATORY_CARE_PROVIDER_SITE_OTHER): Payer: Medicaid Other | Admitting: Radiology

## 2012-08-07 DIAGNOSIS — R404 Transient alteration of awareness: Secondary | ICD-10-CM

## 2012-08-07 NOTE — Procedures (Signed)
Melanie Cordova is a 52 year old patient with a history of seizures since age 81. The patient had a transient episode of aphasia, and was admitted for a stroke workup. CT scan of brain was unremarkable, MRI cannot be done secondary to a defibrillator placement. The patient is not on anticonvulsant medications. The patient is being evaluated for possible seizures.  This is a routine EEG. No skull defects are noted. Medications include aspirin, Symbicort, iron supplementation, Lasix, gabapentin, Synthroid, lisinopril, Claritin, Mevacor, ranitidine, and Coreg.  EEG classification: Dysrhythmia grade 2 left temporal  Description of the recording: The background rhythms of this recording consists of a fairly well modulated medium amplitude alpha rhythm of 11 Hz that is reactive to eye opening and closure. As the record progresses, the patient appears to remain in the waking state throughout the recording. Photic stimulation was performed, and this results in a bilateral and symmetric photic driving response. Hyperventilation was not performed. Intermittently during the recording, mild mid temporal dysrhythmic slowing was seen. At no time during the recording does there appear to be evidence of spike or spike wave discharges. EKG monitor shows occasional premature ventricular complexes with a heart rate of 72. No electrographic seizures were recorded.  Impression: This is a mildly abnormal EEG recording secondary to intermittent dysrhythmic theta activity emanating from the left mid temporal region. This study suggests a left brain abnormality, with a mild irritability associated with a lowered seizure threshold. Electrographic seizures were not recorded.

## 2012-08-07 NOTE — Telephone Encounter (Signed)
I called patient. The EEG study shows a mild dysrhythmic slowing in the left brain. The patient has had episodes of left-sided weakness and slurred speech, and this does not correlate well with the EEG study. I will try to get a revisit for the patient. We may consider a trial on Keppra in the future.

## 2012-08-08 ENCOUNTER — Ambulatory Visit (HOSPITAL_COMMUNITY)
Admission: RE | Admit: 2012-08-08 | Discharge: 2012-08-08 | Disposition: A | Discharge status: Home / Self Care | Payer: Medicaid Other | Source: Ambulatory Visit | Attending: Nurse Practitioner | Admitting: Nurse Practitioner

## 2012-08-08 NOTE — Progress Notes (Signed)
Physical Therapy Treatment Patient Details  Name: Melanie Cordova MRN: [id number] Date of Birth: Feb 27, 1961  Today's Date: 08/08/2012 Time: 1191-4782 PT Time Calculation (min): 42 min Charges: 15' TE, 27' NMR Visit#: 7 of 12  Re-eval: 08/22/12    Authorization:    Authorization Time Period: 12 visits approved thru 6/11  Authorization Visit#: 7 of 12   Subjective: Symptoms/Limitations Symptoms: Pt reports that she recieved a call from Dr. 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  She is having a difficult time resting.  She has a lot of questions and concerns about her diet. Reports her balance is getting, but still has difficulty picking up stuff from the floor and has increased dizzinness  Precautions/Restrictions     Exercise/Treatments Aerobic Stationary Bike: Nustep hills #3 L2 x 10 minutes for activity tolerance Machines for Strengthening Cybex Knee Extension: 1 PL x10  Cybex Knee Flexion: 2.5 PL x10 Standing Rocker Board: 2 minutes (Side to side) Tandem Gait: Forward;Retro;2 reps;Limitations Tandem Gait Limitations: 2 reps each, min guard assist Numbers 1-15: Balance Beam;2 reps;Limitations Numbers 1-15 Limitations: 1 rep RUE, 1 rep LUE Other Standing Exercises: Chair Sit 3x10 seconds   Physical Therapy Assessment and Plan PT Assessment and Plan Clinical Impression Statement: Sent an inbasket request to Dr. Dietrich Pates regaurding outpatient nutrional services.  Pt continues to demonstrate increased fatigue and is unable to complete LLE knee extension on cybex and requires assistance.  Pt continues to be highly motivated and is able to independently verbalize HEP 2x a day and is provided assistance from her daughter.  Discussed [employer] possibilities at end of PT so she continue with her strengthening  Pt will benefit from skilled therapeutic intervention in order to improve on the following deficits: Decreased  activity tolerance;Decreased balance;Decreased strength;Difficulty walking;Obesity PT Plan: Continue with LE strengthening and balance activities.     Goals    Problem List Patient Active Problem List   Diagnosis Date Noted  . Gingival disease 07/25/2012  . TIA (transient ischemic attack) 11/07/2011  . 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 Hypokalemia 10/01/2011  . Tobacco abuse   . Hyperlipidemia   . Hypertension   . Gastroesophageal reflux disease   . Hypothyroidism   . Obesity 09/30/2010  . Cardiomyopathy 08/13/2010    PT - End of Session Activity Tolerance: Patient tolerated treatment well General Behavior During Therapy: WFL for tasks assessed/performed Cognition: WFL for tasks performed PT Plan of Care PT Patient Instructions: Dicussed increasing HEP reps, adding tandem gait with assistance, and [employer] participation, and nutritional consult.  Consulted and Agree with Plan of Care: Patient  GP Functional Assessment Tool Used: Clinical judgement  Daved Mcfann, MPT, ATC 08/08/2012, 9:33 AM

## 2012-08-10 ENCOUNTER — Ambulatory Visit (HOSPITAL_COMMUNITY)
Admission: RE | Admit: 2012-08-10 | Discharge: 2012-08-10 | Disposition: A | Discharge status: Home / Self Care | Payer: Medicaid Other | Source: Ambulatory Visit | Attending: Nurse Practitioner | Admitting: Nurse Practitioner

## 2012-08-10 DIAGNOSIS — IMO0001 Reserved for inherently not codable concepts without codable children: Secondary | ICD-10-CM | POA: Insufficient documentation

## 2012-08-10 DIAGNOSIS — I10 Essential (primary) hypertension: Secondary | ICD-10-CM | POA: Insufficient documentation

## 2012-08-10 DIAGNOSIS — I69998 Other sequelae following unspecified cerebrovascular disease: Secondary | ICD-10-CM | POA: Insufficient documentation

## 2012-08-10 DIAGNOSIS — M6281 Muscle weakness (generalized): Secondary | ICD-10-CM | POA: Insufficient documentation

## 2012-08-10 DIAGNOSIS — R269 Unspecified abnormalities of gait and mobility: Secondary | ICD-10-CM | POA: Insufficient documentation

## 2012-08-10 NOTE — Progress Notes (Signed)
Physical Therapy Treatment Patient Details  Name: Melanie Cordova MRN: [id number] Date of Birth: Jun 07, 1960  Today's Date: 08/10/2012 Time: 1018-1108 PT Time Calculation (min): 50 min Charge:Therex 30', NMR 20'  Visit#: 8 of 12  Re-eval: 08/22/12 Assessment Diagnosis: Generalized weakness Lt side and balance  Authorization: medicaid  Authorization Time Period: 12 visits approved thru 6/11  Authorization Visit#: 8 of 12   Subjective: Symptoms/Limitations Symptoms: Pt reported she can clean around the house with increased ease and able to complete more tasks without rest breaks required. Pain Assessment Currently in Pain?: No/denies  Precautions/Restrictions  Precautions Precautions: None  Exercise/Treatments Aerobic Stationary Bike: Not available Machines for Strengthening Cybex Knee Extension: 1 PL x15 Cybex Knee Flexion: 2.5 PL x15 Standing Stairs: 2 RT reciprocal pattern 1HR Rocker Board: 3 minutes;Limitations Rocker Board Limitations: R/L and A/P no HHA Other Standing Knee Exercises: Tandem gt Retro gait 1 RT; Tandem stance 3 x 30" bilateral Other Standing Knee Exercises: Side steps with red tubing 4,3,2,1 x 4  Balance Exercises Standing Tandem Gait: Forward;Retro;2 reps;Limitations Tandem Gait Limitations: 2 reps each, min guard assist Retro Gait: 2 reps Numbers 1-15: Balance Beam;2 reps;Limitations Numbers 1-15 Limitations: 1 rep RUE, 1 rep LUE Cone Rotation: Foam;Right turn;Left turn   Physical Therapy Assessment and Plan PT Assessment and Plan Clinical Impression Statement: Progressed dynamic balance activities with min assistance for LOB episodes, pt able to recover independently with min assistance..  Began stair training reciprocal pattern with noted weak eccentric control descending stairs, no LOB episodes noted.  Pt very fatigued at end of session, no reports of pain through session PT Plan: Continue with LE strengthening and balance activities.      Goals    Problem List Patient Active Problem List   Diagnosis Date Noted  . Gingival disease 07/25/2012  . TIA (transient ischemic attack) 11/07/2011  . 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 Hypokalemia 10/01/2011  . Tobacco abuse   . Hyperlipidemia   . Hypertension   . Gastroesophageal reflux disease   . Hypothyroidism   . Obesity 09/30/2010  . Cardiomyopathy 08/13/2010    PT - End of Session Equipment Utilized During Treatment: Gait belt Activity Tolerance: Patient tolerated treatment well General Behavior During Therapy: [facility] for tasks assessed/performed Cognition: WFL for tasks performed  GP    Juel Burrow 08/10/2012, 11:49 AM

## 2012-08-13 ENCOUNTER — Encounter: Payer: Self-pay | Admitting: Internal Medicine

## 2012-08-13 ENCOUNTER — Ambulatory Visit (HOSPITAL_COMMUNITY)
Admission: RE | Admit: 2012-08-13 | Discharge: 2012-08-13 | Disposition: A | Discharge status: Home / Self Care | Payer: Medicaid Other | Source: Ambulatory Visit | Attending: Nurse Practitioner | Admitting: Nurse Practitioner

## 2012-08-13 ENCOUNTER — Ambulatory Visit (HOSPITAL_COMMUNITY): Payer: Medicaid Other

## 2012-08-13 NOTE — Evaluation (Signed)
Speech Language Pathology Evaluation Patient Details  Name: Melanie Cordova MRN: [id number] Date of Birth: 1960-06-17  Today's Date: 08/13/2012 Time: 1122-1218 SLP Time Calculation (min): 56 min  Authorization: Medicaid  Authorization Time Period:    Authorization Visit#:   of     Past Medical History:  Past Medical History  Diagnosis Date  . Hypertension     09/2010-normal CMet and CBC; Lipid profile-116, 88, 25, 73  . Cardiomyopathy 08/2010    Presented with congestive heart failure; EF of 15% and 2012; hypotension on medication precludes optimal dosing  . Gastroesophageal reflux disease   . Hypothyroidism     Recent TSH was normal.  . Pneumonia   . Obesity   . Hyperlipidemia   . Tobacco abuse     20 pack years  . CHF (congestive heart failure)   . Asthma   . Shortness of breath   . Seizures   . Headache(784.0)   . Arthritis   . Anxiety   . Neuropathy   . 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 Chronic systolic heart failure   . Stroke    Past Surgical History:  Past Surgical History  Procedure Laterality Date  . Cesarean section      X2  . Tee without cardioversion N/A 07/11/2012    Procedure: TRANSESOPHAGEAL ECHOCARDIOGRAM (TEE);  Surgeon: Vesta Mixer, MD;  Location[facility] ENDOSCOPY;  Service: Cardiovascular;  Laterality: N/A;   HPI:  Symptoms/Limitations Symptoms: "I stay tired and have a hard time concentrating." Pain Assessment Currently in Pain?: No/denies  Prior Functional Status  Cognitive/Linguistic Baseline: Information not available Type of Home: Apartment Lives With: Alone Available Help at Discharge: Friend(s);Available PRN/intermittently Education: GED Vocation: On disability  Balance Screening Balance Screen Has the patient fallen in the past 6 months: No Has the patient had a decrease in activity level because of a fear of falling? : No Is the patient reluctant to leave their home because of a fear of falling? :  No  Cognition  Overall Cognitive Status: Within Functional Limits for tasks assessed Arousal/Alertness: Awake/alert Orientation Level: Oriented X4 Attention: Sustained Sustained Attention: Appears intact Memory: Impaired Memory Impairment: Storage deficit (4/10 first trial, 10/10 second, & recall 9/10) Awareness: Appears intact Problem Solving: Appears intact Problem Solving Impairment: Verbal complex;Functional complex Safety/Judgment: Appears intact [employer] Scales of Cognitive Functioning: Purposeful/appropriate  Comprehension  Auditory Comprehension Overall Auditory Comprehension: Appears within functional limits for tasks assessed Yes/No Questions: Within Functional Limits Commands: Within Functional Limits Conversation: Complex Interfering Components: Attention Visual Recognition/Discrimination Discrimination: Within Function Limits Reading Comprehension Reading Status: Within funtional limits  Expression  Expression Primary Mode of Expression: Verbal Verbal Expression Overall Verbal Expression: Appears within functional limits for tasks assessed Initiation: No impairment Level of Generative/Spontaneous Verbalization: Conversation Repetition: No impairment Naming: No impairment Pragmatics: No impairment Interfering Components: Attention Non-Verbal Means of Communication: Not applicable Written Expression Dominant Hand: Right Written Expression: Within Functional Limits  Oral/Motor  Oral Motor/Sensory Function Overall Oral Motor/Sensory Function: Appears within functional limits for tasks assessed Motor Speech Overall Motor Speech: Appears within functional limits for tasks assessed  SLP Goals   N/A  Assessment/Plan  Patient Active Problem List   Diagnosis Date Noted  . Gingival disease 07/25/2012  . TIA (transient ischemic attack) 11/07/2011  . 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 Hypokalemia 10/01/2011  . Tobacco abuse   . Hyperlipidemia   .  Hypertension   . Gastroesophageal reflux disease   . Hypothyroidism   .  Obesity 09/30/2010  . Cardiomyopathy 08/13/2010   SLP - End of Session Activity Tolerance: Patient tolerated treatment well General Behavior During Therapy: WFL for tasks assessed/performed Cognition: WFL for tasks performed  SLP Assessment/Plan Clinical Impression Statement: [name] is a 52 yo female who was referred for outpatient cognitive linguistic evaluation by [facility] after her recent inpatient admission to [facility] Ms. Melanie Cordova was admitted twice in the past 6 weeks for stroke-like symptoms. CT was negative and MRI could not be completed due to pt's defibrillator. Follow up with Dr. Anne Hahn (neurology) showed left brain abnormality on EEG. She was evaluated by SLP while in acute care and follow up out patient evaluation was recommended.  Pt was initially very dysarthric (per pt), but now feels her speech just becomes slurred when fatigued.  Prior to December 2012, Ms. Melanie Cordova worked in [employer], but has been on disability since then. She enjoys crafting, bible study, spending time with her grandchildren, and walking with her friends. She does not drive, so her daughter takes her to the grocery store. SHe is able to manage her medication and finances on her own. Ms. Melanie Cordova demonstrated very mild working memory impairment and initially recalled 4/10 words when given association cue, but improved to 10/10 with a second reading of list and also recalled 9/10 words without association cues. She was able to complete a moderate level deduction puzzle without assist. Ms. Melanie Cordova was provided a written list of memory strategies and these were reviewed with her. She appears to be functioning independently and safely at home, but acknowledges that she needs better sleep to feel her best. No further SLP intervention is warranted at this time. Ms.Melanie Cordova was given my phone number should further  questions arise.   .Thank you,  Havery Moros, CCC-SLP [phone]      Neoma Uhrich 08/13/2012, 1:18 PM  Physician Documentation Your signature is required to indicate approval of the treatment plan as stated above.  Please sign and either send electronically or make a copy of this report for your files and return this physician signed original.  Please mark one 1.__approve of plan  2. ___approve of plan with the following conditions.   ______________________________                                                          _____________________ Physician Signature                                                                                                             Date

## 2012-08-15 ENCOUNTER — Other Ambulatory Visit (HOSPITAL_COMMUNITY): Payer: Self-pay

## 2012-08-15 DIAGNOSIS — M79609 Pain in unspecified limb: Secondary | ICD-10-CM

## 2012-08-17 ENCOUNTER — Encounter (HOSPITAL_COMMUNITY): Payer: Self-pay | Admitting: Unknown Physician Specialty

## 2012-08-21 ENCOUNTER — Telehealth: Payer: Self-pay | Admitting: *Deleted

## 2012-08-21 ENCOUNTER — Ambulatory Visit (HOSPITAL_COMMUNITY)
Admission: RE | Admit: 2012-08-21 | Discharge: 2012-08-21 | Disposition: A | Discharge status: Home / Self Care | Payer: Medicaid Other | Source: Ambulatory Visit | Attending: Nurse Practitioner | Admitting: Nurse Practitioner

## 2012-08-21 DIAGNOSIS — R252 Cramp and spasm: Secondary | ICD-10-CM | POA: Insufficient documentation

## 2012-08-21 DIAGNOSIS — M79609 Pain in unspecified limb: Secondary | ICD-10-CM | POA: Insufficient documentation

## 2012-08-21 DIAGNOSIS — R209 Unspecified disturbances of skin sensation: Secondary | ICD-10-CM | POA: Insufficient documentation

## 2012-08-21 NOTE — Telephone Encounter (Signed)
Called pt to verify which pharmacy she is transferring too and to advise that her cardiologist does not fill the gabapentin nor the levothyroxine, nor symbicort those medications will have to be filled by PCP, however we can send the remiaining Coreg, crestor and furosemide per managed by cardiology, mobile number noted as disconnected and home number noted busy times 3 will call back

## 2012-08-21 NOTE — Progress Notes (Addendum)
*  PRELIMINARY RESULTS* Vascular Ultrasound Lower extremity venous duplex has been completed.  Preliminary findings: Negative for DVT and baker's cyst.  Attempted call report to [phone] with no answer. Let patient leave since study negative.   Farrel Demark, RDMS, RVT  08/21/2012, 10:41 AM

## 2012-08-21 NOTE — Telephone Encounter (Signed)
TRANSFERING PHARMACIES  THEY ARE CALLING TO GET ALL NEW RX FOR PATIENT BECAUSE THEY ARE HAVING A HARD TIME GETTING THEM TRANSFERRED FROM OTHER PHARMACIES  COREG, CRESTOR, FUROSIMIDE, SYMBICORT, gabapentin, levothyroxine   ,

## 2012-08-24 NOTE — Telephone Encounter (Signed)
Number bust times 5 today

## 2012-08-29 ENCOUNTER — Encounter: Payer: Self-pay | Admitting: *Deleted

## 2012-08-29 MED ORDER — FUROSEMIDE 40 MG PO TABS: 40.0000 mg | ORAL_TABLET | Freq: Two times a day (BID) | ORAL | Status: AC

## 2012-08-29 MED ORDER — POTASSIUM CHLORIDE CRYS ER 20 MEQ PO TBCR: 40.0000 meq | EXTENDED_RELEASE_TABLET | Freq: Two times a day (BID) | ORAL | Status: DC

## 2012-08-29 MED ORDER — LISINOPRIL 10 MG PO TABS: 10.0000 mg | ORAL_TABLET | Freq: Every day | ORAL | Status: DC

## 2012-08-29 MED ORDER — LOVASTATIN 40 MG PO TABS: 40.0000 mg | ORAL_TABLET | Freq: Every day | ORAL | Status: DC

## 2012-08-29 MED ORDER — CARVEDILOL 25 MG PO TABS: 12.5000 mg | ORAL_TABLET | Freq: Two times a day (BID) | ORAL | Status: DC

## 2012-08-29 NOTE — Telephone Encounter (Addendum)
rx sent to pharmacy by e-script [facility] for all cardiac medications:  Lasix,lisinopril, mevacor, potassium, and coreg, tried to contact pt on all numbers listed in chart, noted all disconnected  Several attempts have been made to contact pt with no resolve, sent letter to pt address noted in chart with results/instructions/prescriptions. Advised pt to call office if any questions or concerns per letter.

## 2012-08-29 NOTE — Addendum Note (Signed)
Addended by: Derry Lory A on: 08/29/2012 03:03 PM   Modules accepted: Orders

## 2012-09-20 NOTE — H&P (Signed)
HISTORY AND PHYSICAL  Melanie Cordova is a 52 y.o. female patient with CC: Painful teeth.  No diagnosis found.  Past Medical History  Diagnosis Date  . Hypertension     09/2010-normal CMet and CBC; Lipid profile-116, 88, 25, 73  . Cardiomyopathy 08/2010    Presented with congestive heart failure; EF of 15% and 2012; hypotension on medication precludes optimal dosing  . Gastroesophageal reflux disease   . Hypothyroidism     Recent TSH was normal.  . Pneumonia   . Obesity   . Hyperlipidemia   . Tobacco abuse     20 pack years  . CHF (congestive heart failure)   . Asthma   . Shortness of breath   . Seizures   . Headache(784.0)   . Arthritis   . Anxiety   . Neuropathy   . 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 Chronic systolic heart failure   . Stroke     No current facility-administered medications for this encounter.   Current Outpatient Prescriptions  Medication Sig Dispense Refill  . albuterol (PROVENTIL HFA;VENTOLIN HFA) 108 (90 BASE) MCG/ACT inhaler Inhale 2 puffs into the lungs every 6 (six) hours as needed for wheezing or shortness of breath.  1 Inhaler  5  . aspirin 325 MG tablet Take 1 tablet (325 mg total) by mouth daily.  30 tablet  5  . budesonide-formoterol (SYMBICORT) 80-4.5 MCG/ACT inhaler Inhale 2 puffs into the lungs 2 (two) times daily.  1 Inhaler  6  . carvedilol (COREG) 25 MG tablet Take 0.5 tablets (12.5 mg total) by mouth 2 (two) times daily with a meal.  180 tablet  0  . diclofenac sodium (VOLTAREN) 1 % GEL Apply 2 g topically 4 (four) times daily.      . ferrous sulfate (IRON SUPPLEMENT) 325 (65 FE) MG tablet Take 325 mg by mouth daily with breakfast.      . fish oil-omega-3 fatty acids 1000 MG capsule Take 1 g by mouth daily.      . furosemide (LASIX) 40 MG tablet Take 1 tablet (40 mg total) by mouth 2 (two) times daily.  180 tablet  0  . gabapentin (NEURONTIN) 300 MG capsule Take 300 mg by mouth at bedtime.       Marland Kitchen  levothyroxine (SYNTHROID, LEVOTHROID) 150 MCG tablet Take 300 mcg by mouth daily.      Marland Kitchen lisinopril (PRINIVIL,ZESTRIL) 10 MG tablet Take 1 tablet (10 mg total) by mouth daily.  90 tablet  0  . loratadine (CLARITIN) 10 MG tablet Take 1 tablet (10 mg total) by mouth daily.  30 tablet  2  . lovastatin (MEVACOR) 40 MG tablet Take 1 tablet (40 mg total) by mouth at bedtime.  90 tablet  0  . Multiple Vitamins-Minerals (CENTRUM SILVER ADULT 50+ PO) Take by mouth. DAILY      . potassium chloride SA (K-DUR,KLOR-CON) 20 MEQ tablet Take 2 tablets (40 mEq total) by mouth 2 (two) times daily.  180 tablet  0  . ranitidine (ZANTAC) 150 MG tablet Take 150 mg by mouth 2 (two) times daily as needed. Heart Burn       Allergies  Allergen Reactions  . Fish Allergy Anaphylaxis  . Lentil Anaphylaxis  . Penicillins Anaphylaxis and Shortness Of Breath    Hair loss  . Iodinated Diagnostic Agents Hives and Other (See Comments)    Pulmonary problems; no frank respiratory arrest  . Iodine Hives    Pulmonary Problems   .  Lipitor (Atorvastatin)     Rash per pt.  . Sulfa Antibiotics Other (See Comments)    Unknown  . Spironolactone Rash   Active Problems:   * No active hospital problems. *  Vitals: There were no vitals taken for this visit. Lab results:No results found for this or any previous visit (from the past 24 hour(s)). Radiology Results: No results found. General appearance: alert, cooperative and moderately obese Head: Normocephalic, without obvious abnormality, atraumatic Eyes: negative Ears: normal TM's and external ear canals both ears Nose: Nares normal. Septum midline. Mucosa normal. No drainage or sinus tenderness. Throat: Dental caries teeth #'s 2, 4, 6, 7, 9, 11, 13, 18, 20, 21, 22, 23, 24, 25, 26, 27, 29, Right mandibular torus, lesion soft palate Neck: no adenopathy, supple, symmetrical, trachea midline and thyroid not enlarged, symmetric, no tenderness/mass/nodules Resp: clear to  auscultation bilaterally Cardio: regular rate and rhythm, S1, S2 normal, no murmur, click, rub or gallop  Assessment:51 YO WF HTN, Cardiomyopathy, GERD, Hypothyroid, CHF, ICD, Stroke, with non-restorable   teeth #'s 2, 4, 6, 7, 9, 11, 13, 18, 20, 21, 22, 23, 24, 25, 26, 27, 29, Right mandibular torus, lesion soft palate  Plan: extraction teeth #'s 2, 4, 6, 7, 9, 11, 13, 18, 20, 21, 22, 23, 24, 25, 26, 27, 29, removal  Right mandibular torus, biopsy lesion soft palate . General anesthesia. Day surgery.   Georgia Lopes 09/20/2012

## 2012-09-21 ENCOUNTER — Encounter (HOSPITAL_COMMUNITY): Payer: Self-pay | Admitting: *Deleted

## 2012-09-21 ENCOUNTER — Encounter (HOSPITAL_COMMUNITY): Payer: Self-pay | Admitting: Pharmacy Technician

## 2012-09-23 MED ORDER — CLINDAMYCIN PHOSPHATE 900 MG/50ML IV SOLN
900.0000 mg | INTRAVENOUS | Status: DC
  Filled 2012-09-23: qty 50

## 2012-09-24 ENCOUNTER — Encounter (HOSPITAL_COMMUNITY): Payer: Self-pay | Admitting: Anesthesiology

## 2012-09-24 ENCOUNTER — Encounter (HOSPITAL_COMMUNITY)
Admission: RE | Disposition: A | Discharge status: Home / Self Care | Payer: Self-pay | Source: Ambulatory Visit | Attending: Oral Surgery

## 2012-09-24 ENCOUNTER — Ambulatory Visit (HOSPITAL_COMMUNITY)
Admission: RE | Admit: 2012-09-24 | Discharge: 2012-09-24 | Disposition: A | Discharge status: Home / Self Care | Payer: Medicaid Other | Source: Ambulatory Visit | Attending: Oral Surgery | Admitting: Oral Surgery

## 2012-09-24 ENCOUNTER — Ambulatory Visit (HOSPITAL_COMMUNITY): Payer: Medicaid Other | Admitting: Anesthesiology

## 2012-09-24 DIAGNOSIS — I5022 Chronic systolic (congestive) heart failure: Secondary | ICD-10-CM | POA: Insufficient documentation

## 2012-09-24 DIAGNOSIS — E785 Hyperlipidemia, unspecified: Secondary | ICD-10-CM | POA: Insufficient documentation

## 2012-09-24 DIAGNOSIS — I429 Cardiomyopathy, unspecified: Secondary | ICD-10-CM

## 2012-09-24 DIAGNOSIS — F411 Generalized anxiety disorder: Secondary | ICD-10-CM | POA: Insufficient documentation

## 2012-09-24 DIAGNOSIS — E039 Hypothyroidism, unspecified: Secondary | ICD-10-CM | POA: Insufficient documentation

## 2012-09-24 DIAGNOSIS — E876 Hypokalemia: Secondary | ICD-10-CM

## 2012-09-24 DIAGNOSIS — Z95810 Presence of automatic (implantable) cardiac defibrillator: Secondary | ICD-10-CM | POA: Insufficient documentation

## 2012-09-24 DIAGNOSIS — M129 Arthropathy, unspecified: Secondary | ICD-10-CM | POA: Insufficient documentation

## 2012-09-24 DIAGNOSIS — M270 Developmental disorders of jaws: Secondary | ICD-10-CM

## 2012-09-24 DIAGNOSIS — E669 Obesity, unspecified: Secondary | ICD-10-CM | POA: Insufficient documentation

## 2012-09-24 DIAGNOSIS — K1370 Unspecified lesions of oral mucosa: Secondary | ICD-10-CM | POA: Insufficient documentation

## 2012-09-24 DIAGNOSIS — Z720 Tobacco use: Secondary | ICD-10-CM

## 2012-09-24 DIAGNOSIS — I10 Essential (primary) hypertension: Secondary | ICD-10-CM | POA: Insufficient documentation

## 2012-09-24 DIAGNOSIS — F17200 Nicotine dependence, unspecified, uncomplicated: Secondary | ICD-10-CM | POA: Insufficient documentation

## 2012-09-24 DIAGNOSIS — I428 Other cardiomyopathies: Secondary | ICD-10-CM | POA: Insufficient documentation

## 2012-09-24 DIAGNOSIS — G459 Transient cerebral ischemic attack, unspecified: Secondary | ICD-10-CM

## 2012-09-24 DIAGNOSIS — Z6839 Body mass index (BMI) 39.0-39.9, adult: Secondary | ICD-10-CM | POA: Insufficient documentation

## 2012-09-24 DIAGNOSIS — Z8673 Personal history of transient ischemic attack (TIA), and cerebral infarction without residual deficits: Secondary | ICD-10-CM | POA: Insufficient documentation

## 2012-09-24 DIAGNOSIS — J45909 Unspecified asthma, uncomplicated: Secondary | ICD-10-CM | POA: Insufficient documentation

## 2012-09-24 DIAGNOSIS — G589 Mononeuropathy, unspecified: Secondary | ICD-10-CM | POA: Insufficient documentation

## 2012-09-24 DIAGNOSIS — K029 Dental caries, unspecified: Secondary | ICD-10-CM | POA: Insufficient documentation

## 2012-09-24 DIAGNOSIS — I509 Heart failure, unspecified: Secondary | ICD-10-CM | POA: Insufficient documentation

## 2012-09-24 DIAGNOSIS — K069 Disorder of gingiva and edentulous alveolar ridge, unspecified: Secondary | ICD-10-CM

## 2012-09-24 DIAGNOSIS — K1379 Other lesions of oral mucosa: Secondary | ICD-10-CM

## 2012-09-24 DIAGNOSIS — K219 Gastro-esophageal reflux disease without esophagitis: Secondary | ICD-10-CM | POA: Insufficient documentation

## 2012-09-24 HISTORY — DX: Bell's palsy: G51.0

## 2012-09-24 HISTORY — DX: Chronic obstructive pulmonary disease, unspecified: J44.9

## 2012-09-24 HISTORY — DX: Presence of automatic (implantable) cardiac defibrillator: Z95.810

## 2012-09-24 HISTORY — PX: MULTIPLE EXTRACTIONS WITH ALVEOLOPLASTY: SHX5342

## 2012-09-24 HISTORY — DX: Cardiac arrhythmia, unspecified: I49.9

## 2012-09-24 HISTORY — DX: Myoneural disorder, unspecified: G70.9

## 2012-09-24 LAB — POCT I-STAT 4, (NA,K, GLUC, HGB,HCT)
Glucose, Bld: 98 mg/dL (ref 70–99)
HCT: 44 % (ref 36.0–46.0)
Hemoglobin: 15 g/dL (ref 12.0–15.0)
Potassium: 4.5 mEq/L (ref 3.5–5.1)
Potassium: 6.7 mEq/L (ref 3.5–5.1)
Sodium: 138 mEq/L (ref 135–145)

## 2012-09-24 SURGERY — MULTIPLE EXTRACTION WITH ALVEOLOPLASTY
Anesthesia: General | Site: Mouth | Wound class: Clean Contaminated

## 2012-09-24 MED ORDER — ALBUTEROL SULFATE HFA 108 (90 BASE) MCG/ACT IN AERS
INHALATION_SPRAY | RESPIRATORY_TRACT | Status: DC | PRN
  Administered 2012-09-24 (×2): 5 via RESPIRATORY_TRACT

## 2012-09-24 MED ORDER — FENTANYL CITRATE 0.05 MG/ML IJ SOLN
INTRAMUSCULAR | Status: DC | PRN
  Administered 2012-09-24 (×4): 50 ug via INTRAVENOUS

## 2012-09-24 MED ORDER — ONDANSETRON HCL 4 MG/2ML IJ SOLN
INTRAMUSCULAR | Status: DC | PRN
  Administered 2012-09-24: 4 mg via INTRAVENOUS

## 2012-09-24 MED ORDER — PROPOFOL 10 MG/ML IV BOLUS
INTRAVENOUS | Status: DC | PRN
  Administered 2012-09-24 (×2): 50 mg via INTRAVENOUS
  Administered 2012-09-24: 150 mg via INTRAVENOUS

## 2012-09-24 MED ORDER — ARTIFICIAL TEARS OP OINT
TOPICAL_OINTMENT | OPHTHALMIC | Status: DC | PRN
  Administered 2012-09-24: 1 via OPHTHALMIC

## 2012-09-24 MED ORDER — CLINDAMYCIN PHOSPHATE 600 MG/50ML IV SOLN
600.0000 mg | Freq: Four times a day (QID) | INTRAVENOUS | Status: DC
  Administered 2012-09-24: 900 mg via INTRAVENOUS

## 2012-09-24 MED ORDER — SODIUM CHLORIDE 0.9 % IR SOLN
Status: DC | PRN
  Administered 2012-09-24: 1000 mL

## 2012-09-24 MED ORDER — 0.9 % SODIUM CHLORIDE (POUR BTL) OPTIME
TOPICAL | Status: DC | PRN
  Administered 2012-09-24: 1000 mL

## 2012-09-24 MED ORDER — SUCCINYLCHOLINE CHLORIDE 20 MG/ML IJ SOLN
INTRAMUSCULAR | Status: DC | PRN
  Administered 2012-09-24: 100 mg via INTRAVENOUS

## 2012-09-24 MED ORDER — LIDOCAINE HCL 4 % MT SOLN
OROMUCOSAL | Status: DC | PRN
  Administered 2012-09-24: 4 mL via TOPICAL

## 2012-09-24 MED ORDER — KETOROLAC TROMETHAMINE 30 MG/ML IJ SOLN
INTRAMUSCULAR | Status: DC | PRN
  Administered 2012-09-24: 30 mg via INTRAVENOUS

## 2012-09-24 MED ORDER — LIDOCAINE-EPINEPHRINE 2 %-1:100000 IJ SOLN
INTRAMUSCULAR | Status: DC | PRN
  Administered 2012-09-24: 15 mL

## 2012-09-24 MED ORDER — OXYMETAZOLINE HCL 0.05 % NA SOLN
NASAL | Status: AC
  Filled 2012-09-24: qty 15

## 2012-09-24 MED ORDER — OXYMETAZOLINE HCL 0.05 % NA SOLN
NASAL | Status: DC | PRN
  Administered 2012-09-24: 1 via NASAL

## 2012-09-24 MED ORDER — MIDAZOLAM HCL 5 MG/5ML IJ SOLN
INTRAMUSCULAR | Status: DC | PRN
  Administered 2012-09-24: 1 mg via INTRAVENOUS

## 2012-09-24 MED ORDER — PROPOFOL 10 MG/ML IV BOLUS: INTRAVENOUS | Status: DC | PRN

## 2012-09-24 MED ORDER — OXYCODONE-ACETAMINOPHEN 5-325 MG PO TABS: 1.0000 | ORAL_TABLET | ORAL | Status: DC | PRN

## 2012-09-24 MED ORDER — LIDOCAINE-EPINEPHRINE 2 %-1:100000 IJ SOLN
INTRAMUSCULAR | Status: AC
  Filled 2012-09-24: qty 1

## 2012-09-24 MED ORDER — LACTATED RINGERS IV SOLN
INTRAVENOUS | Status: DC | PRN
  Administered 2012-09-24: 07:00:00 via INTRAVENOUS

## 2012-09-24 SURGICAL SUPPLY — 27 items
BUR CROSS CUT FISSURE 1.6 (BURR) ×2 IMPLANT
BUR EGG ELITE 4.0 (BURR) ×1 IMPLANT
CANISTER SUCTION 2500CC (MISCELLANEOUS) ×2 IMPLANT
CLOTH BEACON ORANGE TIMEOUT ST (SAFETY) ×2 IMPLANT
COVER SURGICAL LIGHT HANDLE (MISCELLANEOUS) ×2 IMPLANT
CRADLE DONUT ADULT HEAD (MISCELLANEOUS) ×2 IMPLANT
DECANTER SPIKE VIAL GLASS SM (MISCELLANEOUS) ×2 IMPLANT
GAUZE PACKING FOLDED 2  STR (GAUZE/BANDAGES/DRESSINGS) ×1
GAUZE PACKING FOLDED 2 STR (GAUZE/BANDAGES/DRESSINGS) ×1 IMPLANT
GLOVE BIO SURGEON STRL SZ 6.5 (GLOVE) ×2 IMPLANT
GLOVE BIO SURGEON STRL SZ7.5 (GLOVE) ×2 IMPLANT
GLOVE BIOGEL PI IND STRL 7.0 (GLOVE) ×1 IMPLANT
GLOVE BIOGEL PI INDICATOR 7.0 (GLOVE) ×1
GOWN STRL NON-REIN LRG LVL3 (GOWN DISPOSABLE) ×2 IMPLANT
GOWN STRL REIN XL XLG (GOWN DISPOSABLE) ×2 IMPLANT
KIT BASIN OR (CUSTOM PROCEDURE TRAY) ×2 IMPLANT
KIT ROOM TURNOVER OR (KITS) ×2 IMPLANT
NEEDLE 22X1 1/2 (OR ONLY) (NEEDLE) ×2 IMPLANT
NS IRRIG 1000ML POUR BTL (IV SOLUTION) ×2 IMPLANT
PAD ARMBOARD 7.5X6 YLW CONV (MISCELLANEOUS) ×4 IMPLANT
SUT CHROMIC 3 0 PS 2 (SUTURE) ×3 IMPLANT
SYR CONTROL 10ML LL (SYRINGE) ×2 IMPLANT
TOWEL OR 17X26 10 PK STRL BLUE (TOWEL DISPOSABLE) ×2 IMPLANT
TRAY ENT MC OR (CUSTOM PROCEDURE TRAY) ×2 IMPLANT
TUBING IRRIGATION (MISCELLANEOUS) ×1 IMPLANT
WATER STERILE IRR 1000ML POUR (IV SOLUTION) IMPLANT
YANKAUER SUCT BULB TIP NO VENT (SUCTIONS) ×2 IMPLANT

## 2012-09-24 NOTE — Transfer of Care (Cosign Needed)
Immediate Anesthesia Transfer of Care Note  Patient: [name]  Procedure(s) Performed: Procedure(s): MULTIPLE EXTRACION #2, 4, 6, 7 ,8, 9, 11, 13, 18, 20, 21, 22, 23, 24, 25, 26, 27, 29 WITH ALVEOLOPLASTY, BIOPSY OF PALATE LESION, REMOVA RIGHT LINGUAL TORUS (N/A)  Patient Location: PACU  Anesthesia Type:General  Level of Consciousness: awake and patient cooperative  Airway & Oxygen Therapy: Patient Spontanous Breathing and Patient connected to nasal cannula oxygen  Post-op Assessment: Report given to PACU RN, Post -op Vital signs reviewed and stable and Patient moving all extremities X 4  Post vital signs: Reviewed and stable  Complications: No apparent anesthesia complications

## 2012-09-24 NOTE — Op Note (Signed)
Melanie Cordova, Melanie Cordova            ACCOUNT NO.:  [id number]  MEDICAL RECORD NO.:  [id number]  LOCATION:  [facility]                         FACILITY:  [facility]  PHYSICIAN:  Georgia Lopes, M.D.  DATE OF BIRTH:  10-28-1960  DATE OF PROCEDURE:  09/24/2012 DATE OF DISCHARGE:  09/24/2012                              OPERATIVE REPORT   PREOPERATIVE DIAGNOSIS:  Multiple dental caries, right mandibular lingual torus lesions, soft palate nonrestorable teeth.  POSTOPERATIVE DIAGNOSIS:  Multiple dental caries, right mandibular lingual torus lesions, soft palate nonrestorable teeth.  PROCEDURE: 1. Extraction of teeth #2, #4, #6, #7, #8, #9, #11, #13, #18, #20,     #21, #22, #23, #24, #25, #26, #27, #29. 2. Alveoplasty right and left maxilla and mandible. 3. Removal of right lingual mandibular torus. 4. Biopsy soft palate.  SURGEON:  Georgia Lopes, MD.  ANESTHESIA:  General; Jeanice Lim, MD, attending.  INDICATIONS FOR PROCEDURE:  This patient is a 52 year old female who was referred to me by her general dentist for removal of all remaining teeth.  She has a significant past medical history for cardiomyopathy, EF of 15 in 2012, GERD, hypothyroidism, obesity, congestive heart failure, asthma, because of her multiple medical problems, it was recommended that she undergo the procedure.  General anesthesia. Cardiac consult was obtained and the patient was cleared for surgery.  PROCEDURE NOTE:  The patient was taken to the operating room, placed on the table in supine position.  General anesthesia was administered intravenously and a nasal endotracheal tube was placed and secured.  The eyes were protected.  The patient was draped for the procedure.  Time- out was performed.  The posterior pharynx was suctioned.  A throat pack was placed.  Then, 2% lidocaine 1:100,000 epinephrine was infiltrated in an inferior alveolar block on the right and left side and buccal and palatal dimple and  infiltration around the teeth to be removed. Additional anesthetic was given in the palatal region where the biopsy was to be performed, then a 15-blade was used to make an elliptical incision in the midportion of the junction of the hard and soft palate through the mucosa and the submucosal tissues.  The specimen was removed and submitted for pathology in formalin.  The area was sutured with 3-0 chromic.  Then, the sweetheart retractor and bite block were placed in the right side of the mouth and the mandible on the left side was operated first.  A 15-blade was used make a full-thickness incision buccally and lingually around teeth #18, #20, #21, #22, #23, #24, #25, #26.  The periosteum was reflected with a periosteal elevator.  The teeth were elevated with a 301-elevator and the roof of the mouth with the Ash forceps.  The sockets were then curetted and then the periosteum was further reflected to expose the alveolar crest.  An egg-shaped bur under irrigation was then used to perform the alveoplasty and bone file was used to further smooth the bone and then the area was irrigated and closed with 3-0 chromic.  A 15-blade was used to make an incision around teeth #13, #11, #9, #8, and #7 in the maxilla on the palatal and buccal aspects.  The periosteum  was reflected with a periosteal elevator and then the teeth were elevated with a 301-elevator and removed from the mouth with a #150 dental forceps.  The soft tissue were then curetted. The periosteum was reflected to expose the alveolar bone and the alveoplasty was performed with the egg-shaped bur and bone file.  The naris irrigated and closed with 3-0 chromic.  The bite block and sweetheart retractor were repositioned to the other side of the mouth and a 15-blade was used to make an incision around teeth #27, #29 and extending onto the distal aspect of the mandible on the alveolar crest to expose the right bony tuberosity.  A 15-blade was  also used to make incisions around teeth #2, #4, and #6.  The periosteum was reflected in the maxilla and mandible with a periosteal elevator.  The teeth were elevated with a 301-elevator.  The lower teeth were removed with the Ash forceps and the upper teeth were removed with #150 forceps.  The sockets were then curetted, then the periosteum was further reflected to expose the alveolar crest in the maxilla and mandible.  Alveoplasty was performed with the egg-shaped bur and bone file, and then using the Seldin retractor, the right lingual torus was removed using the egg- shaped bur under irrigation.  Then, all areas were irrigated and closed with 3-0 chromic.  The oral cavity was inspected and found to have good contour and closure and hemostasis.  The oral cavity was then suctioned and throat pack was removed.  The patient was awakened taken to the recovery room, breathing spontaneously in good condition.  ESTIMATED BLOOD LOSS:  Minimum.  COMPLICATIONS:  None.     Georgia Lopes, M.D.     SMJ/MEDQ  D:  09/24/2012  T:  09/24/2012  Job:  [id number]

## 2012-09-24 NOTE — H&P (Signed)
H&P documentation  -History and Physical Reviewed  -Patient has been re-examined  -No change in the plan of care  Melanie Cordova  

## 2012-09-24 NOTE — Addendum Note (Signed)
Addendum created 09/24/12 1010 by Remonia Richter, MD   Modules edited: Anesthesia Attestations

## 2012-09-24 NOTE — Preoperative (Signed)
Beta Blockers   Reason not to administer Beta Blockers:Coreg this am 

## 2012-09-24 NOTE — Op Note (Signed)
09/24/2012  8:35 AM  PATIENT:  Melanie Cordova  52 y.o. female  PRE-OPERATIVE DIAGNOSIS:  DENTAL CARIES, RIGHT MANDIBLE LINGUAL TORUS, Lesion soft palate  POST-OPERATIVE DIAGNOSIS:  SAME  PROCEDURE:  Procedure(s): MULTIPLE EXTRACION #2, 4, 6, 7 ,8, 9, 11, 13, 18, 20, 21, 22, 23, 24, 25, 26, 27, 29 WITH ALVEOLOPLASTY, REMOVA RIGHT LINGUAL TORUS, Biopsy soft palate  SURGEON:  Surgeon(s): Georgia Lopes, DDS  ANESTHESIA:   local and general  EBL:  minimal  DRAINS: none   SPECIMEN:  No Specimen  COUNTS:  YES  PLAN OF CARE: Discharge to home after PACU  PATIENT DISPOSITION:  PACU - hemodynamically stable.   PROCEDURE DETAILS: Dictation # [id number]  Georgia Lopes, DMD 09/24/2012 8:35 AM

## 2012-09-24 NOTE — Anesthesia Postprocedure Evaluation (Signed)
Anesthesia Post Note  Patient: Melanie Cordova  Procedure(s) Performed: Procedure(s) (LRB): MULTIPLE EXTRACION #2, 4, 6, 7 ,8, 9, 11, 13, 18, 20, 21, 22, 23, 24, 25, 26, 27, 29 WITH ALVEOLOPLASTY, BIOPSY OF PALATE LESION, REMOVA RIGHT LINGUAL TORUS (N/A)  Anesthesia type: general  Patient location: PACU  Post pain: Pain level controlled  Post assessment: Patient's Cardiovascular Status Stable  Post vital signs: Reviewed and stable  Level of consciousness: sedated  Complications: No apparent anesthesia complications

## 2012-09-24 NOTE — Progress Notes (Signed)
Received report from [facility]. Pt resting comfortably. VSS.

## 2012-09-24 NOTE — Anesthesia Procedure Notes (Addendum)
Performed by: Cathie Olden B    Procedure Name: Intubation Date/Time: 09/24/2012 7:52 AM Performed by: Sherie Don Pre-anesthesia Checklist: Patient identified, Emergency Drugs available, Suction available, Patient being monitored and Timeout performed Patient Re-evaluated:Patient Re-evaluated prior to inductionOxygen Delivery Method: Circle system utilized Preoxygenation: Pre-oxygenation with 100% oxygen Intubation Type: IV induction Ventilation: Mask ventilation without difficulty Laryngoscope Size: Mac and 3 Grade View: Grade I Nasal Tubes: Right, Nasal Rae, Magill forceps- large, utilized and Nasal prep performed Tube size: 7.0 mm Number of attempts: 1 Airway Equipment and Method: LTA kit utilized Placement Confirmation: ETT inserted through vocal cords under direct vision,  positive ETCO2 and breath sounds checked- equal and bilateral Tube secured with: Tape Dental Injury: Teeth and Oropharynx as per pre-operative assessment

## 2012-09-24 NOTE — Anesthesia Preprocedure Evaluation (Addendum)
Anesthesia Evaluation  Patient identified by MRN, date of birth, ID band Patient awake    Reviewed: Allergy & Precautions, H&P , NPO status , Patient's Chart, lab work & pertinent test results  History of Anesthesia Complications Negative for: history of anesthetic complications  Airway Mallampati: I TM Distance: >3 FB Neck ROM: Full    Dental  (+) Poor Dentition and Dental Advisory Given   Pulmonary shortness of breath and with exertion, asthma , pneumonia -, COPD COPD inhaler, Current Smoker,    + wheezing      Cardiovascular hypertension, Pt. on medications +CHF + dysrhythmias + Cardiac Defibrillator     Neuro/Psych  Headaches, Seizures -, Well Controlled,  Anxiety TIACVA    GI/Hepatic GERD-  Medicated,  Endo/Other  Hypothyroidism Morbid obesity  Renal/GU negative Renal ROS     Musculoskeletal   Abdominal   Peds  Hematology   Anesthesia Other Findings   Reproductive/Obstetrics                          Anesthesia Physical Anesthesia Plan  ASA: III  Anesthesia Plan: General   Post-op Pain Management:    Induction: Intravenous  Airway Management Planned: Nasal ETT  Additional Equipment: Arterial line  Intra-op Plan:   Post-operative Plan: Extubation in OR  Informed Consent: I have reviewed the patients History and Physical, chart, labs and discussed the procedure including the risks, benefits and alternatives for the proposed anesthesia with the patient or authorized representative who has indicated his/her understanding and acceptance.   Dental advisory given  Plan Discussed with: CRNA, Anesthesiologist and Surgeon  Anesthesia Plan Comments:        Anesthesia Quick Evaluation

## 2012-09-25 ENCOUNTER — Encounter (HOSPITAL_COMMUNITY): Payer: Self-pay | Admitting: Oral Surgery

## 2012-09-25 MED ORDER — CARVEDILOL 25 MG PO TABS: 12.5000 mg | ORAL_TABLET | Freq: Two times a day (BID) | ORAL | Status: DC

## 2012-09-25 NOTE — Telephone Encounter (Addendum)
Pt daughter called into front desk to advise the coreg was called in to the wrong pharmacy per needs to be [facility] with address [address] not the [place] address as well as the synthroid, the pt was advised this medication was prescribed in the Ed and will not be filled by the cardiology office, also noted letter sent to explain this medication management to the pt, noted the address given by pt daughter for [facility] as [address] [place] is not listed in our database as option to choose from contact [facility], contacted number given for mail order pharmacy as [phone], located pharmacy in database listed as [facility] [place] with correct address advised to put a rush on order for pt Pt will call back to our office with any further concerns if necessary

## 2012-09-25 NOTE — Addendum Note (Signed)
Addended by: Derry Lory A on: 09/25/2012 04:28 PM   Modules accepted: Orders

## 2012-09-30 ENCOUNTER — Encounter (HOSPITAL_COMMUNITY): Payer: Self-pay | Admitting: *Deleted

## 2012-09-30 ENCOUNTER — Emergency Department (HOSPITAL_COMMUNITY)
Admission: EM | Admit: 2012-09-30 | Discharge: 2012-09-30 | Disposition: A | Discharge status: Home / Self Care | Payer: Medicaid Other | Attending: Emergency Medicine | Admitting: Emergency Medicine

## 2012-09-30 ENCOUNTER — Emergency Department (HOSPITAL_COMMUNITY): Payer: Medicaid Other

## 2012-09-30 DIAGNOSIS — I10 Essential (primary) hypertension: Secondary | ICD-10-CM | POA: Insufficient documentation

## 2012-09-30 DIAGNOSIS — F411 Generalized anxiety disorder: Secondary | ICD-10-CM | POA: Insufficient documentation

## 2012-09-30 DIAGNOSIS — Z79899 Other long term (current) drug therapy: Secondary | ICD-10-CM | POA: Insufficient documentation

## 2012-09-30 DIAGNOSIS — Z8701 Personal history of pneumonia (recurrent): Secondary | ICD-10-CM | POA: Insufficient documentation

## 2012-09-30 DIAGNOSIS — Z8669 Personal history of other diseases of the nervous system and sense organs: Secondary | ICD-10-CM | POA: Insufficient documentation

## 2012-09-30 DIAGNOSIS — I5022 Chronic systolic (congestive) heart failure: Secondary | ICD-10-CM | POA: Insufficient documentation

## 2012-09-30 DIAGNOSIS — M542 Cervicalgia: Secondary | ICD-10-CM | POA: Insufficient documentation

## 2012-09-30 DIAGNOSIS — L03211 Cellulitis of face: Secondary | ICD-10-CM | POA: Insufficient documentation

## 2012-09-30 DIAGNOSIS — Z880 Allergy status to penicillin: Secondary | ICD-10-CM | POA: Insufficient documentation

## 2012-09-30 DIAGNOSIS — E669 Obesity, unspecified: Secondary | ICD-10-CM | POA: Insufficient documentation

## 2012-09-30 DIAGNOSIS — L0390 Cellulitis, unspecified: Secondary | ICD-10-CM

## 2012-09-30 DIAGNOSIS — F17200 Nicotine dependence, unspecified, uncomplicated: Secondary | ICD-10-CM | POA: Insufficient documentation

## 2012-09-30 DIAGNOSIS — J4489 Other specified chronic obstructive pulmonary disease: Secondary | ICD-10-CM | POA: Insufficient documentation

## 2012-09-30 DIAGNOSIS — Z7982 Long term (current) use of aspirin: Secondary | ICD-10-CM | POA: Insufficient documentation

## 2012-09-30 DIAGNOSIS — Z8679 Personal history of other diseases of the circulatory system: Secondary | ICD-10-CM | POA: Insufficient documentation

## 2012-09-30 DIAGNOSIS — Z8673 Personal history of transient ischemic attack (TIA), and cerebral infarction without residual deficits: Secondary | ICD-10-CM | POA: Insufficient documentation

## 2012-09-30 DIAGNOSIS — M129 Arthropathy, unspecified: Secondary | ICD-10-CM | POA: Insufficient documentation

## 2012-09-30 DIAGNOSIS — K219 Gastro-esophageal reflux disease without esophagitis: Secondary | ICD-10-CM | POA: Insufficient documentation

## 2012-09-30 DIAGNOSIS — L0201 Cutaneous abscess of face: Secondary | ICD-10-CM | POA: Insufficient documentation

## 2012-09-30 DIAGNOSIS — J449 Chronic obstructive pulmonary disease, unspecified: Secondary | ICD-10-CM | POA: Insufficient documentation

## 2012-09-30 DIAGNOSIS — Z95810 Presence of automatic (implantable) cardiac defibrillator: Secondary | ICD-10-CM | POA: Insufficient documentation

## 2012-09-30 LAB — CBC WITH DIFFERENTIAL/PLATELET
Eosinophils Absolute: 0.4 10*3/uL (ref 0.0–0.7)
Eosinophils Relative: 3 % (ref 0–5)
HCT: 42.5 % (ref 36.0–46.0)
Lymphocytes Relative: 20 % (ref 12–46)
Lymphs Abs: 2.7 10*3/uL (ref 0.7–4.0)
MCH: 30.2 pg (ref 26.0–34.0)
MCV: 91 fL (ref 78.0–100.0)
Monocytes Absolute: 1 10*3/uL (ref 0.1–1.0)
RBC: 4.67 MIL/uL (ref 3.87–5.11)
WBC: 13.1 10*3/uL — ABNORMAL HIGH (ref 4.0–10.5)

## 2012-09-30 LAB — COMPREHENSIVE METABOLIC PANEL
BUN: 7 mg/dL (ref 6–23)
CO2: 28 mEq/L (ref 19–32)
Calcium: 9.4 mg/dL (ref 8.4–10.5)
Creatinine, Ser: 0.74 mg/dL (ref 0.50–1.10)
GFR calc Af Amer: >90 mL/min (ref >90)
GFR calc non Af Amer: >90 mL/min (ref >90)
Glucose, Bld: 104 mg/dL — ABNORMAL HIGH (ref 70–99)
Total Protein: 7 g/dL (ref 6.0–8.3)

## 2012-09-30 MED ORDER — DIPHENHYDRAMINE HCL 50 MG/ML IJ SOLN
50.0000 mg | Freq: Once | INTRAMUSCULAR | Status: AC
  Administered 2012-09-30: 50 mg via INTRAVENOUS
  Filled 2012-09-30: qty 1

## 2012-09-30 MED ORDER — VANCOMYCIN HCL IN DEXTROSE 1-5 GM/200ML-% IV SOLN
1000.0000 mg | Freq: Once | INTRAVENOUS | Status: AC
  Administered 2012-09-30: 1000 mg via INTRAVENOUS
  Filled 2012-09-30: qty 200

## 2012-09-30 MED ORDER — CLINDAMYCIN PHOSPHATE 900 MG/50ML IV SOLN
900.0000 mg | Freq: Once | INTRAVENOUS | Status: AC
  Administered 2012-09-30: 900 mg via INTRAVENOUS

## 2012-09-30 MED ORDER — CLINDAMYCIN HCL 150 MG PO CAPS: ORAL_CAPSULE | ORAL | Status: DC

## 2012-09-30 MED ORDER — IOHEXOL 300 MG/ML  SOLN
80.0000 mL | Freq: Once | INTRAMUSCULAR | Status: AC | PRN
  Administered 2012-09-30: 80 mL via INTRAVENOUS

## 2012-09-30 MED ORDER — DOUBLE ANTIBIOTIC 500-10000 UNIT/GM EX OINT: TOPICAL_OINTMENT | Freq: Once | CUTANEOUS | Status: DC

## 2012-09-30 MED ORDER — CLINDAMYCIN PHOSPHATE 900 MG/50ML IV SOLN
INTRAVENOUS | Status: AC
  Administered 2012-09-30: 900 mg
  Filled 2012-09-30: qty 50

## 2012-09-30 MED ORDER — DEXTROSE 5 % IV SOLN: 900.0000 mg | Freq: Once | INTRAVENOUS | Status: DC

## 2012-09-30 MED ORDER — METHYLPREDNISOLONE SODIUM SUCC 125 MG IJ SOLR
125.0000 mg | Freq: Once | INTRAMUSCULAR | Status: AC
  Administered 2012-09-30: 125 mg via INTRAVENOUS
  Filled 2012-09-30: qty 2

## 2012-09-30 MED ORDER — SODIUM CHLORIDE 0.9 % IV BOLUS (SEPSIS)
1000.0000 mL | Freq: Once | INTRAVENOUS | Status: AC
  Administered 2012-09-30: 1000 mL via INTRAVENOUS

## 2012-09-30 NOTE — ED Notes (Signed)
Pt states she is allergic to IVP dye. EDP aware. Pt to be pre medicated prior to test

## 2012-09-30 NOTE — ED Notes (Signed)
Pt reports having teeth pulled on Monday.  States that since that time she has had increased swelling of mouth and neck.  Reports that all night she was having difficulty breathing when laying down, no difficulty when sitting up.

## 2012-09-30 NOTE — ED Provider Notes (Signed)
History    This chart was scribed for Melanie Lennert, MD by Leone Payor, ED Scribe. This patient was seen in room APA19/APA19 and the patient's care was started 7:08 AM.  CSN: [id number] Arrival date & time 09/30/12  0613  First MD Initiated Contact with Patient 09/30/12 0703     Chief Complaint  Patient presents with  . Oral Swelling    Patient is a 52 y.o. female presenting with tooth pain. The history is provided by the patient. No language interpreter was used.  Dental Pain Location:  Generalized Quality:  Constant Severity:  Moderate Onset quality:  Gradual Duration:  4 days Timing:  Constant Progression:  Worsening Chronicity:  New Context: recent dental surgery   Relieved by:  Ice Worsened by:  Touching Associated symptoms: difficulty swallowing, facial swelling, neck pain and neck swelling   Associated symptoms: no congestion, no fever and no headaches     HPI Comments: Melanie Cordova is a 52 y.o. female who presents to the Emergency Department complaining of constant, worsening oral and neck swelling starting 4 days ago after having her teeth pulled. The procedure was performed on 09/24/12 by Dr. Barbette Merino in [place]. States the mouth and neck area are very painful and she has trouble swallowing when laying down. She has tried eating popsicles to help relieve the pain and swelling. She was prescribed phenergan and pain medication but not any antibiotics. She denies fever.   Past Medical History  Diagnosis Date  . Hypertension     09/2010-normal CMet and CBC; Lipid profile-116, 88, 25, 73  . Cardiomyopathy 08/2010    Presented with congestive heart failure; EF of 15% and 2012; hypotension on medication precludes optimal dosing  . Gastroesophageal reflux disease   . Hypothyroidism     Recent TSH was normal.  . Pneumonia   . Obesity   . Hyperlipidemia   . Tobacco abuse     20 pack years  . CHF (congestive heart failure)   . Asthma   . Shortness of breath   .  Headache(784.0)   . Arthritis   . Anxiety   . Neuropathy   . 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 Chronic systolic heart failure   . Dysrhythmia   . Automatic implantable cardioverter-defibrillator in situ     2013, july  . COPD (chronic obstructive pulmonary disease)     2013  . Bell palsy     states has had 3 episodes  . Stroke      stroke in 07/2012 and another in June, 2014  . Seizures     last one at age 60  . Neuromuscular disorder     Neuropathy, right foot and leg   Past Surgical History  Procedure Laterality Date  . Cesarean section      X2  . Tee without cardioversion N/A 07/11/2012    Procedure: TRANSESOPHAGEAL ECHOCARDIOGRAM (TEE);  Surgeon: Vesta Mixer, MD;  Location: [facility] ENDOSCOPY;  Service: Cardiovascular;  Laterality: N/A;  . Tubal ligation    . Multiple extractions with alveoloplasty N/A 09/24/2012    Procedure: MULTIPLE EXTRACION #2, 4, 6, 7 ,8, 9, 11, 13, 18, 20, 21, 22, 23, 24, 25, 26, 27, 29 WITH ALVEOLOPLASTY, BIOPSY OF PALATE LESION, REMOVA RIGHT LINGUAL TORUS;  Surgeon: Georgia Lopes, DDS;  Location: [facility] OR;  Service: Oral Surgery;  Laterality: N/A;   Family History  Problem Relation Age of Onset  . Cardiomyopathy Mother  ICD pacemaker-ischmic CM  . Heart failure Mother   . Hypertension Mother   . Cardiomyopathy Father     Deceased  . Coronary artery disease Father   . Heart failure Father   . Heart failure Brother   . Hypertension Brother    History  Substance Use Topics  . Smoking status: Current Every Day Smoker -- 0.50 packs/day for 21 years    Types: Cigarettes  . Smokeless tobacco: Never Used  . Alcohol Use: No   OB History   Grav Para Term Preterm Abortions TAB SAB Ect Mult Living                 Review of Systems  Constitutional: Negative for fever, appetite change and fatigue.  HENT: Positive for facial swelling, neck pain and dental problem. Negative for congestion, sinus pressure and ear discharge.    Eyes: Negative for discharge.  Respiratory: Negative for cough.   Cardiovascular: Negative for chest pain.  Gastrointestinal: Negative for abdominal pain and diarrhea.  Genitourinary: Negative for frequency and hematuria.  Musculoskeletal: Negative for back pain.  Skin: Negative for rash.  Neurological: Negative for seizures and headaches.  Psychiatric/Behavioral: Negative for hallucinations.  All other systems reviewed and are negative.    Allergies  Fish allergy; Lentil; Penicillins; Iodinated diagnostic agents; Iodine; Lipitor; Sulfa antibiotics; and Spironolactone  Home Medications   Current Outpatient Rx  Name  Route  Sig  Dispense  Refill  . albuterol (PROVENTIL HFA;VENTOLIN HFA) 108 (90 BASE) MCG/ACT inhaler   Inhalation   Inhale 2 puffs into the lungs every 6 (six) hours as needed for wheezing or shortness of breath.   1 Inhaler   5   . aspirin 325 MG tablet   Oral   Take 1 tablet (325 mg total) by mouth daily.   30 tablet   5   . budesonide-formoterol (SYMBICORT) 80-4.5 MCG/ACT inhaler   Inhalation   Inhale 2 puffs into the lungs 2 (two) times daily.   1 Inhaler   6   . carvedilol (COREG) 25 MG tablet   Oral   Take 0.5 tablets (12.5 mg total) by mouth 2 (two) times daily with a meal.   180 tablet   0   . diclofenac sodium (VOLTAREN) 1 % GEL   Topical   Apply 2 g topically 4 (four) times daily as needed (pain).          . ferrous sulfate (IRON SUPPLEMENT) 325 (65 FE) MG tablet   Oral   Take 325 mg by mouth daily with breakfast.         . furosemide (LASIX) 40 MG tablet   Oral   Take 1 tablet (40 mg total) by mouth 2 (two) times daily.   180 tablet   0   . gabapentin (NEURONTIN) 300 MG capsule   Oral   Take 300 mg by mouth at bedtime.          Marland Kitchen levothyroxine (SYNTHROID, LEVOTHROID) 150 MCG tablet   Oral   Take 300 mcg by mouth daily.         Marland Kitchen lisinopril (PRINIVIL,ZESTRIL) 10 MG tablet   Oral   Take 1 tablet (10 mg total) by mouth  daily.   90 tablet   0   . loratadine (CLARITIN) 10 MG tablet   Oral   Take 1 tablet (10 mg total) by mouth daily.   30 tablet   2   . lovastatin (MEVACOR) 40 MG tablet   Oral  Take 1 tablet (40 mg total) by mouth at bedtime.   90 tablet   0   . oxyCODONE-acetaminophen (PERCOCET) 5-325 MG per tablet   Oral   Take 1-2 tablets by mouth every 4 (four) hours as needed for pain.   40 tablet   0   . potassium chloride SA (K-DUR,KLOR-CON) 20 MEQ tablet   Oral   Take 2 tablets (40 mEq total) by mouth 2 (two) times daily.   180 tablet   0   . ranitidine (ZANTAC) 150 MG tablet   Oral   Take 150 mg by mouth 2 (two) times daily as needed for heartburn.           BP 123/87  Pulse 96  Temp(Src) 98.8 F (37.1 C) (Oral)  Resp 20  Ht 5\' 7"  (1.702 m)  Wt 272 lb (123.378 kg)  BMI 42.59 kg/m2  SpO2 97% Physical Exam  Nursing note and vitals reviewed. Constitutional: She is oriented to person, place, and time. She appears well-developed.  HENT:  Head: Normocephalic.  Severe swelling under chin, worse on the right. Moderate tenderness to palpation.   Eyes: Conjunctivae and EOM are normal. No scleral icterus.  Neck: Neck supple. No thyromegaly present.  Cardiovascular: Normal rate and regular rhythm.  Exam reveals no gallop and no friction rub.   No murmur heard. Pulmonary/Chest: No stridor. She has no wheezes. She has no rales. She exhibits no tenderness.  Abdominal: She exhibits no distension. There is no tenderness. There is no rebound.  Musculoskeletal: Normal range of motion. She exhibits no edema.  Lymphadenopathy:    She has no cervical adenopathy.  Neurological: She is oriented to person, place, and time. Coordination normal.  Skin: No rash noted. No erythema.  Psychiatric: She has a normal mood and affect. Her behavior is normal.    ED Course  Procedures (including critical care time)  DIAGNOSTIC STUDIES: Oxygen Saturation is 97% on RA, adequate by my  interpretation.    COORDINATION OF CARE: 7:13 AM Discussed treatment plan with pt at bedside and pt agreed to plan. t  Labs Reviewed  CBC WITH DIFFERENTIAL - Abnormal; Notable for the following:    WBC 13.1 (*)    Neutro Abs 9.0 (*)    All other components within normal limits  COMPREHENSIVE METABOLIC PANEL - Abnormal; Notable for the following:    Glucose, Bld 104 (*)    Albumin 3.2 (*)    All other components within normal limits   Ct Soft Tissue Neck W Contrast  09/30/2012   *RADIOLOGY REPORT*  Clinical Data: Oral swelling  CT NECK WITH CONTRAST  Technique:  Multidetector CT imaging of the neck was performed with intravenous contrast.  Contrast: 80mL OMNIPAQUE IOHEXOL 300 MG/ML  SOLN  Comparison: None  Findings: The visualized intracranial contents are unremarkable.  There is mild mucosal thickening involving the floor of the right maxillary sinus.  The other paranasal sinuses appear clear.  No facial bone fracture identified.  The patient is edentulous. There is mild skin thickening and subcutaneous fat stranding within the soft tissues overlying the mandible.  The no focal fluid collections are identified.  Prominent, nonpathologically enlarged level II lymph nodes are identified bilaterally. No adenopathy present.  The upper airway appears patent.  Limited imaging through the upper chest shows clear lung apices.  No adenopathy identified within the superior mediastinum.  IMPRESSION:  1.  Findings compatible with cellulitis involving the soft tissues overlying the mandible. 2.  No abscess identified.  Original Report Authenticated By: Signa Kell, M.D.   No diagnosis found. I spoke with dr. Barbette Merino and he will see the pt tomorrow am.  We will tx with cleocin MDM   The chart was scribed for me under my direct supervision.  I personally performed the history, physical, and medical decision making and all procedures in the evaluation of this patient.Melanie Lennert, MD 09/30/12  1346

## 2012-10-04 ENCOUNTER — Other Ambulatory Visit: Payer: Self-pay | Admitting: *Deleted

## 2012-10-04 ENCOUNTER — Encounter: Payer: Self-pay | Admitting: *Deleted

## 2012-10-04 DIAGNOSIS — E782 Mixed hyperlipidemia: Secondary | ICD-10-CM

## 2012-10-04 DIAGNOSIS — I10 Essential (primary) hypertension: Secondary | ICD-10-CM

## 2012-10-17 ENCOUNTER — Telehealth: Payer: Self-pay | Admitting: *Deleted

## 2012-10-17 MED ORDER — LEVOTHYROXINE SODIUM 150 MCG PO TABS: 300.0000 ug | ORAL_TABLET | Freq: Every day | ORAL | Status: DC

## 2012-10-17 NOTE — Telephone Encounter (Signed)
Medication sent via escribe.  

## 2012-10-17 NOTE — Telephone Encounter (Signed)
NEEDS CALLED IN TO [facility]  levothyroxine (SYNTHROID, LEVOTHROID) 150 MCG tablet

## 2012-10-18 LAB — LIPID PANEL
HDL: 38 mg/dL — ABNORMAL LOW (ref >39)
LDL Cholesterol: 70 mg/dL (ref 0–99)
Total CHOL/HDL Ratio: 4.7 Ratio
Triglycerides: 357 mg/dL — ABNORMAL HIGH (ref <150)
VLDL: 71 mg/dL — ABNORMAL HIGH (ref 0–40)

## 2012-10-18 LAB — BASIC METABOLIC PANEL
Calcium: 9.6 mg/dL (ref 8.4–10.5)
Creat: 0.83 mg/dL (ref 0.50–1.10)

## 2012-10-22 ENCOUNTER — Encounter: Payer: Self-pay | Admitting: *Deleted

## 2012-10-30 ENCOUNTER — Emergency Department (HOSPITAL_COMMUNITY)
Admission: EM | Admit: 2012-10-30 | Discharge: 2012-10-30 | Disposition: A | Discharge status: Home / Self Care | Payer: Medicaid Other | Attending: Emergency Medicine | Admitting: Emergency Medicine

## 2012-10-30 ENCOUNTER — Encounter (HOSPITAL_COMMUNITY): Payer: Self-pay | Admitting: *Deleted

## 2012-10-30 DIAGNOSIS — Z95810 Presence of automatic (implantable) cardiac defibrillator: Secondary | ICD-10-CM | POA: Insufficient documentation

## 2012-10-30 DIAGNOSIS — R55 Syncope and collapse: Secondary | ICD-10-CM | POA: Insufficient documentation

## 2012-10-30 DIAGNOSIS — Z79899 Other long term (current) drug therapy: Secondary | ICD-10-CM | POA: Insufficient documentation

## 2012-10-30 DIAGNOSIS — I509 Heart failure, unspecified: Secondary | ICD-10-CM | POA: Insufficient documentation

## 2012-10-30 DIAGNOSIS — Z7982 Long term (current) use of aspirin: Secondary | ICD-10-CM | POA: Insufficient documentation

## 2012-10-30 DIAGNOSIS — T465X1A Poisoning by other antihypertensive drugs, accidental (unintentional), initial encounter: Secondary | ICD-10-CM | POA: Insufficient documentation

## 2012-10-30 DIAGNOSIS — R4789 Other speech disturbances: Secondary | ICD-10-CM | POA: Insufficient documentation

## 2012-10-30 DIAGNOSIS — Z8673 Personal history of transient ischemic attack (TIA), and cerebral infarction without residual deficits: Secondary | ICD-10-CM | POA: Insufficient documentation

## 2012-10-30 DIAGNOSIS — T465X5A Adverse effect of other antihypertensive drugs, initial encounter: Secondary | ICD-10-CM | POA: Insufficient documentation

## 2012-10-30 DIAGNOSIS — F411 Generalized anxiety disorder: Secondary | ICD-10-CM | POA: Insufficient documentation

## 2012-10-30 DIAGNOSIS — M129 Arthropathy, unspecified: Secondary | ICD-10-CM | POA: Insufficient documentation

## 2012-10-30 DIAGNOSIS — Z8669 Personal history of other diseases of the nervous system and sense organs: Secondary | ICD-10-CM | POA: Insufficient documentation

## 2012-10-30 DIAGNOSIS — Y831 Surgical operation with implant of artificial internal device as the cause of abnormal reaction of the patient, or of later complication, without mention of misadventure at the time of the procedure: Secondary | ICD-10-CM | POA: Insufficient documentation

## 2012-10-30 DIAGNOSIS — J441 Chronic obstructive pulmonary disease with (acute) exacerbation: Secondary | ICD-10-CM | POA: Insufficient documentation

## 2012-10-30 DIAGNOSIS — R0602 Shortness of breath: Secondary | ICD-10-CM | POA: Insufficient documentation

## 2012-10-30 DIAGNOSIS — E785 Hyperlipidemia, unspecified: Secondary | ICD-10-CM | POA: Insufficient documentation

## 2012-10-30 DIAGNOSIS — Z880 Allergy status to penicillin: Secondary | ICD-10-CM | POA: Insufficient documentation

## 2012-10-30 DIAGNOSIS — I5022 Chronic systolic (congestive) heart failure: Secondary | ICD-10-CM | POA: Insufficient documentation

## 2012-10-30 DIAGNOSIS — I10 Essential (primary) hypertension: Secondary | ICD-10-CM | POA: Insufficient documentation

## 2012-10-30 DIAGNOSIS — F17200 Nicotine dependence, unspecified, uncomplicated: Secondary | ICD-10-CM | POA: Insufficient documentation

## 2012-10-30 DIAGNOSIS — Z8679 Personal history of other diseases of the circulatory system: Secondary | ICD-10-CM | POA: Insufficient documentation

## 2012-10-30 DIAGNOSIS — Z8719 Personal history of other diseases of the digestive system: Secondary | ICD-10-CM | POA: Insufficient documentation

## 2012-10-30 DIAGNOSIS — Z8701 Personal history of pneumonia (recurrent): Secondary | ICD-10-CM | POA: Insufficient documentation

## 2012-10-30 DIAGNOSIS — T466X5A Adverse effect of antihyperlipidemic and antiarteriosclerotic drugs, initial encounter: Secondary | ICD-10-CM

## 2012-10-30 DIAGNOSIS — E669 Obesity, unspecified: Secondary | ICD-10-CM | POA: Insufficient documentation

## 2012-10-30 LAB — POCT I-STAT, CHEM 8
Calcium, Ion: 1.14 mmol/L (ref 1.12–1.23)
Hemoglobin: 15 g/dL (ref 12.0–15.0)
Sodium: 139 mEq/L (ref 135–145)
TCO2: 28 mmol/L (ref 0–100)

## 2012-10-30 LAB — POCT I-STAT TROPONIN I: Troponin i, poc: 0 ng/mL (ref 0.00–0.08)

## 2012-10-30 MED ORDER — CARVEDILOL 25 MG PO TABS: 12.5000 mg | ORAL_TABLET | Freq: Two times a day (BID) | ORAL | Status: DC

## 2012-10-30 NOTE — ED Notes (Signed)
Spoke with Tammy Sours from [place].  Tammy Sours reports that pt. Did not have shock.  Tammy Sours reports that pt. Also did not have any arrythmia.  Tammy Sours also spoke to Dr. Radford Pax.  Tammy Sours will be faxing report to [facility].

## 2012-10-30 NOTE — ED Notes (Signed)
[employer].  [phone],   Serial Number [id number]  , Implant Date . October 10, 2011,   Dr. Lewayne Bunting

## 2012-10-30 NOTE — ED Notes (Signed)
Pt.s AICD being interrogated

## 2012-10-30 NOTE — ED Provider Notes (Signed)
CSN: [id number]     Arrival date & time 10/30/12  1729 History     First MD Initiated Contact with Patient 10/30/12 1756     Chief Complaint  Patient presents with  . AICD Problem   HPI Patient states that she had a very brief episode of chest tightness that is now gone.  Her sister thought she not have been shocked although patient states she did not receive a shock.  Patient recently had her blood pressure medicine increased from one half tablet twice a day to 2 tablets twice a day. Pt.s sister also believes that she was shocked when the AICD fired. Pt. Felt a near syncopal And felt her lt chest was burning, her speech was slurred, Presently, pt. Continues to have lt. Side of her chest is burning but has improved. No neuro deficits noted. Speech is clear. No sob. Noted. Alert and oriented X 4.   Past Medical History  Diagnosis Date  . Hypertension     09/2010-normal CMet and CBC; Lipid profile-116, 88, 25, 73  . Cardiomyopathy 08/2010    Presented with congestive heart failure; EF of 15% and 2012; hypotension on medication precludes optimal dosing  . Gastroesophageal reflux disease   . Hypothyroidism     Recent TSH was normal.  . Pneumonia   . Obesity   . Hyperlipidemia   . Tobacco abuse     20 pack years  . CHF (congestive heart failure)   . Asthma   . Shortness of breath   . Headache(784.0)   . Arthritis   . Anxiety   . Neuropathy   . 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 Chronic systolic heart failure   . Dysrhythmia   . Automatic implantable cardioverter-defibrillator in situ     2013, july  . COPD (chronic obstructive pulmonary disease)     2013  . Bell palsy     states has had 3 episodes  . Stroke      stroke in 07/2012 and another in June, 2014  . Seizures     last one at age 26  . Neuromuscular disorder     Neuropathy, right foot and leg   Past Surgical History  Procedure Laterality Date  . Cesarean section      X2  . Tee without  cardioversion N/A 07/11/2012    Procedure: TRANSESOPHAGEAL ECHOCARDIOGRAM (TEE);  Surgeon: Vesta Mixer, MD;  Location[facility] ENDOSCOPY;  Service: Cardiovascular;  Laterality: N/A;  . Tubal ligation    . Multiple extractions with alveoloplasty N/A 09/24/2012    Procedure: MULTIPLE EXTRACION #2, 4, 6, 7 ,8, 9, 11, 13, 18, 20, 21, 22, 23, 24, 25, 26, 27, 29 WITH ALVEOLOPLASTY, BIOPSY OF PALATE LESION, REMOVA RIGHT LINGUAL TORUS;  Surgeon: Georgia Lopes, DDS;  Location: [facility] OR;  Service: Oral Surgery;  Laterality: N/A;   Family History  Problem Relation Age of Onset  . Cardiomyopathy Mother     ICD pacemaker-ischmic CM  . Heart failure Mother   . Hypertension Mother   . Cardiomyopathy Father     Deceased  . Coronary artery disease Father   . Heart failure Father   . Heart failure Brother   . Hypertension Brother    History  Substance Use Topics  . Smoking status: Current Every Day Smoker -- 0.50 packs/day for 21 years    Types: Cigarettes  . Smokeless tobacco: Never Used  . Alcohol Use: No   OB History   Grav  Para Term Preterm Abortions TAB SAB Ect Mult Living                 Review of Systems  All other systems reviewed and are negative.    Allergies  Fish allergy; Lentil; Penicillins; Iodinated diagnostic agents; Iodine; Lipitor; Sulfa antibiotics; and Spironolactone  Home Medications   Current Outpatient Rx  Name  Route  Sig  Dispense  Refill  . albuterol (PROVENTIL HFA;VENTOLIN HFA) 108 (90 BASE) MCG/ACT inhaler   Inhalation   Inhale 2 puffs into the lungs every 6 (six) hours as needed for wheezing or shortness of breath.   1 Inhaler   5   . aspirin 325 MG tablet   Oral   Take 1 tablet (325 mg total) by mouth daily.   30 tablet   5   . budesonide-formoterol (SYMBICORT) 80-4.5 MCG/ACT inhaler   Inhalation   Inhale 2 puffs into the lungs 2 (two) times daily.   1 Inhaler   6   . diclofenac sodium (VOLTAREN) 1 % GEL   Topical   Apply 2 g topically 4 (four)  times daily as needed (pain).          . ferrous sulfate (IRON SUPPLEMENT) 325 (65 FE) MG tablet   Oral   Take 325 mg by mouth daily with breakfast.         . furosemide (LASIX) 40 MG tablet   Oral   Take 1 tablet (40 mg total) by mouth 2 (two) times daily.   180 tablet   0   . gabapentin (NEURONTIN) 300 MG capsule   Oral   Take 300 mg by mouth at bedtime.          Marland Kitchen levothyroxine (SYNTHROID, LEVOTHROID) 150 MCG tablet   Oral   Take 2 tablets (300 mcg total) by mouth daily.   60 tablet   6   . lisinopril (PRINIVIL,ZESTRIL) 10 MG tablet   Oral   Take 1 tablet (10 mg total) by mouth daily.   90 tablet   0   . lovastatin (MEVACOR) 40 MG tablet   Oral   Take 1 tablet (40 mg total) by mouth at bedtime.   90 tablet   0   . oxymetazoline (AFRIN) 0.05 % nasal spray   Nasal   Place 2 sprays into the nose daily as needed for congestion.         . potassium chloride SA (K-DUR,KLOR-CON) 20 MEQ tablet   Oral   Take 2 tablets (40 mEq total) by mouth 2 (two) times daily.   180 tablet   0   . promethazine (PHENERGAN) 25 MG tablet   Oral   Take 25 mg by mouth every 6 (six) hours as needed for nausea.         . carvedilol (COREG) 25 MG tablet   Oral   Take 0.5 tablets (12.5 mg total) by mouth 2 (two) times daily with a meal.   180 tablet   0   . clindamycin (CLEOCIN) 150 MG capsule      2 tablets three times a day   60 capsule   0    BP 101/59  Pulse 72  Resp 20  SpO2 95% Physical Exam  Nursing note and vitals reviewed. Constitutional: She is oriented to person, place, and time. She appears well-developed and well-nourished. No distress.  HENT:  Head: Normocephalic and atraumatic.  Eyes: Pupils are equal, round, and reactive to light.  Neck: Normal range  of motion.  Cardiovascular: Normal rate, normal heart sounds and intact distal pulses.  Exam reveals no friction rub.   Pulmonary/Chest: No respiratory distress.  Abdominal: Normal appearance. She  exhibits no distension. There is no tenderness. There is no rebound.  Musculoskeletal: Normal range of motion.  Neurological: She is alert and oriented to person, place, and time. No cranial nerve deficit.  Skin: Skin is warm and dry. No rash noted.  Psychiatric: She has a normal mood and affect. Her behavior is normal.    ED Course   Procedures (including critical care time)  Date: 10/30/2012  Rate: 69  Rhythm: normal sinus rhythm  QRS Axis: normal  Intervals: normal  ST/T Wave abnormalities: Borderline T wave changes no change from previous tracing.  Conduction Disutrbances: Borderline intraventricular conduction delay.  Narrative Interpretation: Abnormal EKG but no significant change found when compared to tracing from May 2014     Labs Reviewed  POCT I-STAT TROPONIN I  POCT I-STAT, CHEM 8   No results found. 1. Adverse reaction to statin medication     MDM  Patient had a systolic pressure of 98 pounds in around them.  I did review her medications and she had gone from one half tablet of Coreg twice a day to 2 tablets Zocor a twice a day.  Since then she's been feeling lightheaded and dizzy.  Interrogation of her defibrillator revealed no shocks and everything looked normal.  After treatment in the ED the patient feels back to baseline and wants to go home.  Nelia Shi, MD 10/30/12 [phone]

## 2012-10-30 NOTE — ED Notes (Signed)
MD at bedside. 

## 2012-10-30 NOTE — ED Notes (Signed)
Pt. Believes that her AICD fired this afternoon.  Pt.s sister also believes that she was shocked when the AICD fired.    Pt. Felt a near syncopal  And felt her lt chest was burning, her speech was slurred,    Presently, pt. Continues to have lt. Side of her chest is burning but has improved.  No neuro deficits noted.   Speech is clear. No sob. Noted. Alert and oriented X 4.

## 2012-11-15 ENCOUNTER — Encounter (HOSPITAL_COMMUNITY): Payer: Self-pay | Admitting: *Deleted

## 2012-11-15 ENCOUNTER — Emergency Department (HOSPITAL_COMMUNITY)
Admission: EM | Admit: 2012-11-15 | Discharge: 2012-11-15 | Disposition: A | Discharge status: Home / Self Care | Payer: Medicaid Other | Attending: Emergency Medicine | Admitting: Emergency Medicine

## 2012-11-15 ENCOUNTER — Emergency Department (HOSPITAL_COMMUNITY): Payer: Medicaid Other

## 2012-11-15 DIAGNOSIS — G43109 Migraine with aura, not intractable, without status migrainosus: Secondary | ICD-10-CM

## 2012-11-15 DIAGNOSIS — F17200 Nicotine dependence, unspecified, uncomplicated: Secondary | ICD-10-CM | POA: Insufficient documentation

## 2012-11-15 DIAGNOSIS — R209 Unspecified disturbances of skin sensation: Secondary | ICD-10-CM | POA: Insufficient documentation

## 2012-11-15 DIAGNOSIS — E785 Hyperlipidemia, unspecified: Secondary | ICD-10-CM | POA: Insufficient documentation

## 2012-11-15 DIAGNOSIS — M6281 Muscle weakness (generalized): Secondary | ICD-10-CM | POA: Insufficient documentation

## 2012-11-15 DIAGNOSIS — Z8701 Personal history of pneumonia (recurrent): Secondary | ICD-10-CM | POA: Insufficient documentation

## 2012-11-15 DIAGNOSIS — E669 Obesity, unspecified: Secondary | ICD-10-CM | POA: Insufficient documentation

## 2012-11-15 DIAGNOSIS — Z8669 Personal history of other diseases of the nervous system and sense organs: Secondary | ICD-10-CM | POA: Insufficient documentation

## 2012-11-15 DIAGNOSIS — R531 Weakness: Secondary | ICD-10-CM

## 2012-11-15 DIAGNOSIS — M129 Arthropathy, unspecified: Secondary | ICD-10-CM | POA: Insufficient documentation

## 2012-11-15 DIAGNOSIS — G589 Mononeuropathy, unspecified: Secondary | ICD-10-CM | POA: Insufficient documentation

## 2012-11-15 DIAGNOSIS — F411 Generalized anxiety disorder: Secondary | ICD-10-CM | POA: Insufficient documentation

## 2012-11-15 DIAGNOSIS — Z8673 Personal history of transient ischemic attack (TIA), and cerebral infarction without residual deficits: Secondary | ICD-10-CM | POA: Insufficient documentation

## 2012-11-15 DIAGNOSIS — Z95810 Presence of automatic (implantable) cardiac defibrillator: Secondary | ICD-10-CM | POA: Insufficient documentation

## 2012-11-15 DIAGNOSIS — R5381 Other malaise: Secondary | ICD-10-CM

## 2012-11-15 DIAGNOSIS — J449 Chronic obstructive pulmonary disease, unspecified: Secondary | ICD-10-CM | POA: Insufficient documentation

## 2012-11-15 DIAGNOSIS — J4489 Other specified chronic obstructive pulmonary disease: Secondary | ICD-10-CM | POA: Insufficient documentation

## 2012-11-15 DIAGNOSIS — K219 Gastro-esophageal reflux disease without esophagitis: Secondary | ICD-10-CM | POA: Insufficient documentation

## 2012-11-15 DIAGNOSIS — G40909 Epilepsy, unspecified, not intractable, without status epilepticus: Secondary | ICD-10-CM | POA: Insufficient documentation

## 2012-11-15 DIAGNOSIS — I10 Essential (primary) hypertension: Secondary | ICD-10-CM | POA: Insufficient documentation

## 2012-11-15 DIAGNOSIS — Z8679 Personal history of other diseases of the circulatory system: Secondary | ICD-10-CM | POA: Insufficient documentation

## 2012-11-15 DIAGNOSIS — Z880 Allergy status to penicillin: Secondary | ICD-10-CM | POA: Insufficient documentation

## 2012-11-15 DIAGNOSIS — I5022 Chronic systolic (congestive) heart failure: Secondary | ICD-10-CM | POA: Insufficient documentation

## 2012-11-15 DIAGNOSIS — E039 Hypothyroidism, unspecified: Secondary | ICD-10-CM | POA: Insufficient documentation

## 2012-11-15 DIAGNOSIS — Z79899 Other long term (current) drug therapy: Secondary | ICD-10-CM | POA: Insufficient documentation

## 2012-11-15 LAB — POCT I-STAT, CHEM 8
HCT: 43 % (ref 36.0–46.0)
Hemoglobin: 14.6 g/dL (ref 12.0–15.0)
Potassium: 4.2 mEq/L (ref 3.5–5.1)
Sodium: 140 mEq/L (ref 135–145)
TCO2: 25 mmol/L (ref 0–100)

## 2012-11-15 LAB — CBC
HCT: 42.5 % (ref 36.0–46.0)
MCV: 91.6 fL (ref 78.0–100.0)
RBC: 4.64 MIL/uL (ref 3.87–5.11)
WBC: 9.5 10*3/uL (ref 4.0–10.5)

## 2012-11-15 LAB — GLUCOSE, CAPILLARY: Glucose-Capillary: 100 mg/dL — ABNORMAL HIGH (ref 70–99)

## 2012-11-15 LAB — URINALYSIS, ROUTINE W REFLEX MICROSCOPIC
Bilirubin Urine: NEGATIVE
Nitrite: POSITIVE — AB
Specific Gravity, Urine: 1.021 (ref 1.005–1.030)
pH: 5.5 (ref 5.0–8.0)

## 2012-11-15 LAB — DIFFERENTIAL
Eosinophils Relative: 3 % (ref 0–5)
Lymphocytes Relative: 35 % (ref 12–46)
Lymphs Abs: 3.3 10*3/uL (ref 0.7–4.0)
Monocytes Absolute: 0.8 10*3/uL (ref 0.1–1.0)

## 2012-11-15 LAB — COMPREHENSIVE METABOLIC PANEL
ALT: 21 U/L (ref 0–35)
CO2: 23 mEq/L (ref 19–32)
Calcium: 9.3 mg/dL (ref 8.4–10.5)
Creatinine, Ser: 0.61 mg/dL (ref 0.50–1.10)
GFR calc Af Amer: >90 mL/min (ref >90)
GFR calc non Af Amer: >90 mL/min (ref >90)
Glucose, Bld: 93 mg/dL (ref 70–99)
Sodium: 136 mEq/L (ref 135–145)
Total Bilirubin: 0.3 mg/dL (ref 0.3–1.2)

## 2012-11-15 LAB — POCT I-STAT TROPONIN I: Troponin i, poc: 0.01 ng/mL (ref 0.00–0.08)

## 2012-11-15 LAB — RAPID URINE DRUG SCREEN, HOSP PERFORMED
Barbiturates: NOT DETECTED
Cocaine: NOT DETECTED

## 2012-11-15 LAB — ETHANOL: Alcohol, Ethyl (B): <11 mg/dL (ref 0–11)

## 2012-11-15 LAB — URINE MICROSCOPIC-ADD ON

## 2012-11-15 MED ORDER — DIPHENHYDRAMINE HCL 50 MG/ML IJ SOLN
25.0000 mg | Freq: Once | INTRAMUSCULAR | Status: AC
  Administered 2012-11-15: 25 mg via INTRAVENOUS
  Filled 2012-11-15: qty 1

## 2012-11-15 MED ORDER — METOCLOPRAMIDE HCL 5 MG/ML IJ SOLN
10.0000 mg | Freq: Once | INTRAMUSCULAR | Status: AC
  Administered 2012-11-15: 10 mg via INTRAVENOUS
  Filled 2012-11-15: qty 2

## 2012-11-15 NOTE — ED Notes (Addendum)
Pt was with friend at 1530 and began to have slurred speech and L facial drooping.  Pt was c/o occipital headache, which she states she experienced with her other strokes.  Hx of 3 strokes since may with L sided deficits.  Per EMS, pt has improved since their arrival.  Stroke RN, lab and edp present on arrival.  Pt ao x 4.

## 2012-11-15 NOTE — ED Provider Notes (Signed)
CSN: [id number]     Arrival date & time 11/15/12  1840 History   First MD Initiated Contact with Patient 11/15/12 1846     Chief Complaint  Patient presents with  . Code Stroke   (Consider location/radiation/quality/duration/timing/severity/associated sxs/prior Treatment) HPI Last known well 5:45 PM, at baseline his left sided weakness arm and leg, new onset this evening dysarthria left facial droop with left facial arm and leg weakness and numbness New compared to baseline improving but not back to baseline, mild headache, patient did have some clenching of her left fist with onset of symptoms but no other obvious seizure-like activity, no severe headache, no chest pain shortness breath abdominal pain or vertigo, no treatment prior to arrival except code stroke activated by EMS. Past Medical History  Diagnosis Date  . Hypertension     09/2010-normal CMet and CBC; Lipid profile-116, 88, 25, 73  . Cardiomyopathy 08/2010    Presented with congestive heart failure; EF of 15% and 2012; hypotension on medication precludes optimal dosing  . Gastroesophageal reflux disease   . Pneumonia   . Obesity   . Hyperlipidemia   . Tobacco abuse     20 pack years  . CHF (congestive heart failure)   . Asthma   . Shortness of breath   . Headache(784.0)   . Arthritis   . Anxiety   . Neuropathy   . 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 Chronic systolic heart failure   . Dysrhythmia   . Automatic implantable cardioverter-defibrillator in situ     2013, july  . COPD (chronic obstructive pulmonary disease)     2013  . Bell palsy     states has had 3 episodes  . Stroke      stroke in 07/2012 and another in June, 2014  . Seizures     last one at age 66  . Neuromuscular disorder     Neuropathy, right foot and leg  . Hypothyroidism     Recent TSH was normal.   Past Surgical History  Procedure Laterality Date  . Cesarean section      X2  . Tee without cardioversion N/A  07/11/2012    Procedure: TRANSESOPHAGEAL ECHOCARDIOGRAM (TEE);  Surgeon: Vesta Mixer, MD;  Location: [facility] ENDOSCOPY;  Service: Cardiovascular;  Laterality: N/A;  . Tubal ligation    . Multiple extractions with alveoloplasty N/A 09/24/2012    Procedure: MULTIPLE EXTRACION #2, 4, 6, 7 ,8, 9, 11, 13, 18, 20, 21, 22, 23, 24, 25, 26, 27, 29 WITH ALVEOLOPLASTY, BIOPSY OF PALATE LESION, REMOVA RIGHT LINGUAL TORUS;  Surgeon: Georgia Lopes, DDS;  Location: [facility] OR;  Service: Oral Surgery;  Laterality: N/A;   Family History  Problem Relation Age of Onset  . Cardiomyopathy Mother     ICD pacemaker-ischmic CM  . Heart failure Mother   . Hypertension Mother   . Cardiomyopathy Father     Deceased  . Coronary artery disease Father   . Heart failure Father   . Heart failure Brother   . Hypertension Brother    History  Substance Use Topics  . Smoking status: Current Every Day Smoker -- 0.50 packs/day for 21 years    Types: Cigarettes  . Smokeless tobacco: Never Used  . Alcohol Use: No   OB History   Grav Para Term Preterm Abortions TAB SAB Ect Mult Living                 Review of Systems 10  Systems reviewed and are negative for acute change except as noted in the HPI. Allergies  Fish allergy; Iodinated diagnostic agents; Iodine; Lentil; Penicillins; Lipitor; Spironolactone; and Sulfa antibiotics  Home Medications   Current Outpatient Rx  Name  Route  Sig  Dispense  Refill  . acetaminophen (TYLENOL) 500 MG tablet   Oral   Take 1,000 mg by mouth every 6 (six) hours as needed for pain.         Marland Kitchen albuterol (PROVENTIL HFA;VENTOLIN HFA) 108 (90 BASE) MCG/ACT inhaler   Inhalation   Inhale 2 puffs into the lungs every 6 (six) hours as needed for wheezing or shortness of breath.   1 Inhaler   5   . aspirin 325 MG tablet   Oral   Take 1 tablet (325 mg total) by mouth daily.   30 tablet   5   . budesonide-formoterol (SYMBICORT) 80-4.5 MCG/ACT inhaler   Inhalation   Inhale 2 puffs into  the lungs 2 (two) times daily.   1 Inhaler   6   . carvedilol (COREG) 25 MG tablet   Oral   Take 0.5 tablets (12.5 mg total) by mouth 2 (two) times daily with a meal.   180 tablet   0   . desloratadine (CLARINEX) 5 MG tablet   Oral   Take 5 mg by mouth daily.         . diclofenac sodium (VOLTAREN) 1 % GEL   Topical   Apply 2 g topically 4 (four) times daily as needed (pain).          . ferrous sulfate (IRON SUPPLEMENT) 325 (65 FE) MG tablet   Oral   Take 325 mg by mouth daily with breakfast.         . furosemide (LASIX) 40 MG tablet   Oral   Take 1 tablet (40 mg total) by mouth 2 (two) times daily.   180 tablet   0   . gabapentin (NEURONTIN) 300 MG capsule   Oral   Take 300 mg by mouth at bedtime.          Marland Kitchen levothyroxine (SYNTHROID, LEVOTHROID) 150 MCG tablet   Oral   Take 2 tablets (300 mcg total) by mouth daily.   60 tablet   6   . lisinopril (PRINIVIL,ZESTRIL) 10 MG tablet   Oral   Take 1 tablet (10 mg total) by mouth daily.   90 tablet   0   . lovastatin (MEVACOR) 40 MG tablet   Oral   Take 1 tablet (40 mg total) by mouth at bedtime.   90 tablet   0   . oxymetazoline (AFRIN) 0.05 % nasal spray   Nasal   Place 2 sprays into the nose daily as needed for congestion.         . potassium chloride SA (K-DUR,KLOR-CON) 20 MEQ tablet   Oral   Take 2 tablets (40 mEq total) by mouth 2 (two) times daily.   180 tablet   0   . ranitidine (ZANTAC) 150 MG tablet   Oral   Take 150 mg by mouth daily.          BP 115/60  Pulse 85  Temp(Src) 98.8 F (37.1 C) (Oral)  Resp 16  SpO2 100% Physical Exam  Nursing note and vitals reviewed. Constitutional:  Awake, alert, nontoxic appearance with baseline speech for patient.  HENT:  Head: Atraumatic.  Mouth/Throat: No oropharyngeal exudate.  Questionable slight left lower facial droop  Eyes: EOM  are normal. Pupils are equal, round, and reactive to light. Right eye exhibits no discharge. Left eye  exhibits no discharge.  Neck: Neck supple.  Cardiovascular: Normal rate and regular rhythm.   No murmur heard. Pulmonary/Chest: Effort normal and breath sounds normal. No stridor. No respiratory distress. She has no wheezes. She has no rales. She exhibits no tenderness.  Abdominal: Soft. Bowel sounds are normal. She exhibits no mass. There is no tenderness. There is no rebound.  Musculoskeletal: She exhibits no tenderness.  Baseline ROM, moves extremities  Lymphadenopathy:    She has no cervical adenopathy.  Neurological: She is alert.  Awake, alert, cooperative and aware of situation; motor strength right arm and leg 5 out of 5 strength left arm and leg 4/5 strength; sensation normal to light touch right face arm and leg with decreased light touch slightly to left face arm and leg; peripheral visual fields full to confrontation; slight left lower droop facial asymmetry; tongue midline; major cranial nerves appear intact; no pronator drift, normal finger to nose bilaterally  Skin: No rash noted.  Psychiatric: She has a normal mood and affect.    ED Course  Procedures (including critical care time) ECG: Sinus rhythm, ventricular rate 86, premature ventricular complex, normal axis, nonspecific intraventricular conduction delay, nonspecific inferolateral ST and T. abnormality, no significant change noted compared with 10/30/2012   Pt seen by Neuro and EDP prior to CT.  Patient's symptoms completely resolved in the ED, patient has no dysuria or urinary frequency or abdominal pain, suspect contaminated urine specimen, neurology doubts stroke or TIA and suspects  complicated migraine and recommends discharge. Pt feels improved after observation and/or treatment in ED.Pt requests discharge. Patient / Family / Caregiver informed of clinical course, understand medical decision-making process, and agree with plan. Labs Review Labs Reviewed  URINALYSIS, ROUTINE W REFLEX MICROSCOPIC - Abnormal; Notable  for the following:    APPearance HAZY (*)    Hgb urine dipstick SMALL (*)    Nitrite POSITIVE (*)    Leukocytes, UA MODERATE (*)    All other components within normal limits  GLUCOSE, CAPILLARY - Abnormal; Notable for the following:    Glucose-Capillary 100 (*)    All other components within normal limits  URINE MICROSCOPIC-ADD ON - Abnormal; Notable for the following:    Squamous Epithelial / LPF MANY (*)    Bacteria, UA MANY (*)    All other components within normal limits  URINE CULTURE  ETHANOL  PROTIME-INR  APTT  CBC  DIFFERENTIAL  COMPREHENSIVE METABOLIC PANEL  TROPONIN I  URINE RAPID DRUG SCREEN (HOSP PERFORMED)  POCT I-STAT, CHEM 8  POCT I-STAT TROPONIN I   Imaging Review Ct Head Wo Contrast  11/15/2012   *RADIOLOGY REPORT*  Clinical Data: Left side weakness and numbness.  Speech changes.  CT HEAD WITHOUT CONTRAST  Technique:  Contiguous axial images were obtained from the base of the skull through the vertex without contrast.  Comparison: Head CT 07/27/2012  Findings: Negative for hemorrhage, hydrocephalus, mass effect, mass lesion, or evidence of acute cortically based infarction.  No vascular hyperdensities are seen. The visualized paranasal sinuses, mastoid air cells, and middle ears are clear.  The skull is intact. Soft tissues of the orbits and scalp are unremarkable.  IMPRESSION: No acute intracranial abnormality.   Original Report Authenticated By: Britta Mccreedy, M.D.    MDM   1. Weakness   2. Complicated migraine    I doubt any other EMC precluding discharge at this time including,  but not necessarily limited to the following:SAH, CVA.    Hurman Horn, MD 11/16/12 2120
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
## 2012-11-15 NOTE — ED Notes (Signed)
Pt ao x 4.  Airway patent.
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
## 2012-11-17 LAB — URINE CULTURE

## 2012-11-18 ENCOUNTER — Telehealth (HOSPITAL_COMMUNITY): Payer: Self-pay | Admitting: Emergency Medicine

## 2012-11-18 NOTE — ED Notes (Signed)
Post ED Visit - Positive Culture Follow-up: Successful Patient Follow-Up  Culture assessed and recommendations reviewed by: []  Wes Dulaney, Pharm.D., BCPS []  Celedonio Miyamoto, Pharm.D., BCPS []  Georgina Pillion, Pharm.D., BCPS []  [place], [place].D., BCPS, AAHIVP []  Estella Husk, Pharm.D., BCPS, AAHIVP [x]  Abran Duke, [address].D., BCPS  Positive urine culture  [x]  Patient discharged without antimicrobial prescription and treatment is now indicated []  Organism is resistant to prescribed ED discharge antimicrobial []  Patient with positive blood cultures  Changes discussed with ED provider: Francee Piccolo PA-C New antibiotic prescription: Cipro 250 mg BID x 3 days    Kylie A Holland 11/18/2012, 10:24 AM

## 2012-11-18 NOTE — Progress Notes (Signed)
ED Antimicrobial Stewardship Positive Culture Follow Up   [name] is an 52 y.o. female who presented to [facility] on 11/15/2012 with a chief complaint of  Chief Complaint  Patient presents with  . Code Stroke    Recent Results (from the past 720 hour(s))  URINE CULTURE     Status: None   Collection Time    11/15/12  8:44 PM      Result Value Range Status   Specimen Description URINE, RANDOM   Final   Special Requests NONE   Final   Culture  Setup Time     Final   Value: 11/15/2012 22:14     Performed at [facility] Count     Final   Value: >=100,000 COLONIES/ML     Performed at [facility]   Culture     Final   Value: ESCHERICHIA COLI     Performed at [facility]   Report Status 11/17/2012 FINAL   Final   Organism ID, Bacteria ESCHERICHIA COLI   Final    []  Treated with, organism resistant to prescribed antimicrobial [x]  Patient discharged originally without antimicrobial agent and treatment is now indicated  E. Coli UTI  New antibiotic prescription: Cipro 250 mg PO twice daily for 3 days  ED Provider: Francee Piccolo, PA-C  Abran Duke 11/18/2012, 10:14 AM Infectious Diseases Pharmacist Phone# [phone]

## 2012-11-19 ENCOUNTER — Telehealth (HOSPITAL_COMMUNITY): Payer: Self-pay | Admitting: Emergency Medicine

## 2012-11-19 NOTE — ED Notes (Signed)
Unable to contact patient via phone. Sent letter. °

## 2012-11-24 ENCOUNTER — Emergency Department (HOSPITAL_COMMUNITY)
Admission: EM | Admit: 2012-11-24 | Discharge: 2012-11-24 | Disposition: A | Discharge status: Home / Self Care | Payer: Medicaid Other | Attending: Emergency Medicine | Admitting: Emergency Medicine

## 2012-11-24 ENCOUNTER — Encounter (HOSPITAL_COMMUNITY): Payer: Self-pay | Admitting: Radiology

## 2012-11-24 ENCOUNTER — Emergency Department (HOSPITAL_COMMUNITY): Payer: Medicaid Other

## 2012-11-24 DIAGNOSIS — I5022 Chronic systolic (congestive) heart failure: Secondary | ICD-10-CM | POA: Insufficient documentation

## 2012-11-24 DIAGNOSIS — G40909 Epilepsy, unspecified, not intractable, without status epilepticus: Secondary | ICD-10-CM | POA: Insufficient documentation

## 2012-11-24 DIAGNOSIS — M129 Arthropathy, unspecified: Secondary | ICD-10-CM | POA: Insufficient documentation

## 2012-11-24 DIAGNOSIS — J4489 Other specified chronic obstructive pulmonary disease: Secondary | ICD-10-CM | POA: Insufficient documentation

## 2012-11-24 DIAGNOSIS — E039 Hypothyroidism, unspecified: Secondary | ICD-10-CM | POA: Insufficient documentation

## 2012-11-24 DIAGNOSIS — Z8673 Personal history of transient ischemic attack (TIA), and cerebral infarction without residual deficits: Secondary | ICD-10-CM | POA: Insufficient documentation

## 2012-11-24 DIAGNOSIS — Z8701 Personal history of pneumonia (recurrent): Secondary | ICD-10-CM | POA: Insufficient documentation

## 2012-11-24 DIAGNOSIS — G43809 Other migraine, not intractable, without status migrainosus: Secondary | ICD-10-CM

## 2012-11-24 DIAGNOSIS — E785 Hyperlipidemia, unspecified: Secondary | ICD-10-CM | POA: Insufficient documentation

## 2012-11-24 DIAGNOSIS — G43009 Migraine without aura, not intractable, without status migrainosus: Secondary | ICD-10-CM

## 2012-11-24 DIAGNOSIS — J449 Chronic obstructive pulmonary disease, unspecified: Secondary | ICD-10-CM | POA: Insufficient documentation

## 2012-11-24 DIAGNOSIS — Z79899 Other long term (current) drug therapy: Secondary | ICD-10-CM | POA: Insufficient documentation

## 2012-11-24 DIAGNOSIS — I10 Essential (primary) hypertension: Secondary | ICD-10-CM | POA: Insufficient documentation

## 2012-11-24 DIAGNOSIS — Z95810 Presence of automatic (implantable) cardiac defibrillator: Secondary | ICD-10-CM | POA: Insufficient documentation

## 2012-11-24 DIAGNOSIS — F17200 Nicotine dependence, unspecified, uncomplicated: Secondary | ICD-10-CM | POA: Insufficient documentation

## 2012-11-24 DIAGNOSIS — R51 Headache: Secondary | ICD-10-CM | POA: Insufficient documentation

## 2012-11-24 DIAGNOSIS — E669 Obesity, unspecified: Secondary | ICD-10-CM | POA: Insufficient documentation

## 2012-11-24 DIAGNOSIS — M6281 Muscle weakness (generalized): Secondary | ICD-10-CM | POA: Insufficient documentation

## 2012-11-24 DIAGNOSIS — Z880 Allergy status to penicillin: Secondary | ICD-10-CM | POA: Insufficient documentation

## 2012-11-24 DIAGNOSIS — K219 Gastro-esophageal reflux disease without esophagitis: Secondary | ICD-10-CM | POA: Insufficient documentation

## 2012-11-24 DIAGNOSIS — M549 Dorsalgia, unspecified: Secondary | ICD-10-CM | POA: Insufficient documentation

## 2012-11-24 LAB — CBC
Hemoglobin: 14.2 g/dL (ref 12.0–15.0)
MCH: 30.4 pg (ref 26.0–34.0)
MCHC: 33.3 g/dL (ref 30.0–36.0)
MCV: 91.2 fL (ref 78.0–100.0)
Platelets: 227 10*3/uL (ref 150–400)
RBC: 4.67 MIL/uL (ref 3.87–5.11)

## 2012-11-24 LAB — COMPREHENSIVE METABOLIC PANEL
BUN: 12 mg/dL (ref 6–23)
CO2: 28 mEq/L (ref 19–32)
Calcium: 9.7 mg/dL (ref 8.4–10.5)
Creatinine, Ser: 0.71 mg/dL (ref 0.50–1.10)
GFR calc Af Amer: >90 mL/min (ref >90)
GFR calc non Af Amer: >90 mL/min (ref >90)
Glucose, Bld: 98 mg/dL (ref 70–99)

## 2012-11-24 LAB — GLUCOSE, CAPILLARY
Glucose-Capillary: 92 mg/dL (ref 70–99)
Glucose-Capillary: 111 mg/dL — ABNORMAL HIGH (ref 70–99)

## 2012-11-24 LAB — POCT I-STAT, CHEM 8
Calcium, Ion: 1.25 mmol/L — ABNORMAL HIGH (ref 1.12–1.23)
Glucose, Bld: 99 mg/dL (ref 70–99)
HCT: 43 % (ref 36.0–46.0)
TCO2: 28 mmol/L (ref 0–100)

## 2012-11-24 LAB — PROTIME-INR: Prothrombin Time: 11.9 seconds (ref 11.6–15.2)

## 2012-11-24 LAB — DIFFERENTIAL
Basophils Relative: 0 % (ref 0–1)
Eosinophils Relative: 3 % (ref 0–5)
Lymphs Abs: 3.7 10*3/uL (ref 0.7–4.0)
Monocytes Absolute: 1.2 10*3/uL — ABNORMAL HIGH (ref 0.1–1.0)

## 2012-11-24 LAB — TROPONIN I: Troponin I: <0.3 ng/mL (ref <0.30)

## 2012-11-24 LAB — CG4 I-STAT (LACTIC ACID): Lactic Acid, Venous: 1.32 mmol/L (ref 0.5–2.2)

## 2012-11-24 MED ORDER — MORPHINE SULFATE 4 MG/ML IJ SOLN
4.0000 mg | Freq: Once | INTRAMUSCULAR | Status: AC
  Administered 2012-11-24: 4 mg via INTRAVENOUS
  Filled 2012-11-24: qty 1

## 2012-11-24 MED ORDER — ONDANSETRON HCL 4 MG/2ML IJ SOLN
4.0000 mg | Freq: Once | INTRAMUSCULAR | Status: AC
  Administered 2012-11-24: 4 mg via INTRAVENOUS
  Filled 2012-11-24: qty 2

## 2012-11-24 NOTE — ED Notes (Signed)
Pt's sister-in-law will pick up the pt from the waiting room.

## 2012-11-24 NOTE — ED Notes (Signed)
Reynolds MD at bedside  

## 2012-11-24 NOTE — ED Provider Notes (Signed)
CSN: [id number]     Arrival date & time 11/24/12  0121 History   First MD Initiated Contact with Patient 11/24/12 0445     Chief Complaint  Patient presents with  . Code Stroke   (Consider location/radiation/quality/duration/timing/severity/associated sxs/prior Treatment) HPI   Pain in right low back/right posterior hip radiates posteriorly down thigh. Pain in back of head. Problems with gait. "I waddle like a duck".  Patient reports slurred speech as well.   The patient describes pain as aching, moderately severe and ongoing for > 24 hrs. FEels like previous headaches. Noted to have multiple previous visits to ED with "Code Stroke" as chief complaint. Today, upon discussion with the patient it is clear that she is not experiencing any new neurologic deficits. Her headache feels like previous migraines.   She has multiple medical problems as noted in HPI and reports compliance with his medications. She notes that light and sound make her headache worse. She has been nauseated and says she vomited once PTA.   Patient notes that she has chronic gait instability and chart shows she has a history of cerebellar infarct. .   Past Medical History  Diagnosis Date  . Hypertension     09/2010-normal CMet and CBC; Lipid profile-116, 88, 25, 73  . Cardiomyopathy 08/2010    Presented with congestive heart failure; EF of 15% and 2012; hypotension on medication precludes optimal dosing  . Gastroesophageal reflux disease   . Pneumonia   . Obesity   . Hyperlipidemia   . Tobacco abuse     20 pack years  . CHF (congestive heart failure)   . Asthma   . Shortness of breath   . Headache(784.0)   . Arthritis   . Anxiety   . Neuropathy   . 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 Chronic systolic heart failure   . Dysrhythmia   . Automatic implantable cardioverter-defibrillator in situ     2013, july  . COPD (chronic obstructive pulmonary disease)     2013  . Bell palsy    states has had 3 episodes  . Stroke      stroke in 07/2012 and another in June, 2014  . Seizures     last one at age 10  . Neuromuscular disorder     Neuropathy, right foot and leg  . Hypothyroidism     Recent TSH was normal.   Past Surgical History  Procedure Laterality Date  . Cesarean section      X2  . Tee without cardioversion N/A 07/11/2012    Procedure: TRANSESOPHAGEAL ECHOCARDIOGRAM (TEE);  Surgeon: Vesta Mixer, MD;  Location: [facility] ENDOSCOPY;  Service: Cardiovascular;  Laterality: N/A;  . Tubal ligation    . Multiple extractions with alveoloplasty N/A 09/24/2012    Procedure: MULTIPLE EXTRACION #2, 4, 6, 7 ,8, 9, 11, 13, 18, 20, 21, 22, 23, 24, 25, 26, 27, 29 WITH ALVEOLOPLASTY, BIOPSY OF PALATE LESION, REMOVA RIGHT LINGUAL TORUS;  Surgeon: Georgia Lopes, DDS;  Location: [facility] OR;  Service: Oral Surgery;  Laterality: N/A;   Family History  Problem Relation Age of Onset  . Cardiomyopathy Mother     ICD pacemaker-ischmic CM  . Heart failure Mother   . Hypertension Mother   . Cardiomyopathy Father     Deceased  . Coronary artery disease Father   . Heart failure Father   . Heart failure Brother   . Hypertension Brother    History  Substance Use Topics  .  Smoking status: Current Every Day Smoker -- 0.50 packs/day for 21 years    Types: Cigarettes  . Smokeless tobacco: Never Used  . Alcohol Use: No   OB History   Grav Para Term Preterm Abortions TAB SAB Ect Mult Living                 Review of Systems 10 point ROS obtained and is negative with the exception of sx noted above in HPI.   Allergies  Fish allergy; Iodinated diagnostic agents; Iodine; Lentil; Penicillins; Lipitor; Spironolactone; and Sulfa antibiotics  Home Medications   Current Outpatient Rx  Name  Route  Sig  Dispense  Refill  . acetaminophen (TYLENOL) 500 MG tablet   Oral   Take 1,000 mg by mouth every 6 (six) hours as needed for pain.         Marland Kitchen albuterol (PROVENTIL HFA;VENTOLIN HFA) 108 (90  BASE) MCG/ACT inhaler   Inhalation   Inhale 2 puffs into the lungs every 6 (six) hours as needed for wheezing or shortness of breath.   1 Inhaler   5   . aspirin 325 MG tablet   Oral   Take 1 tablet (325 mg total) by mouth daily.   30 tablet   5   . budesonide-formoterol (SYMBICORT) 80-4.5 MCG/ACT inhaler   Inhalation   Inhale 2 puffs into the lungs 2 (two) times daily.   1 Inhaler   6   . carvedilol (COREG) 25 MG tablet   Oral   Take 0.5 tablets (12.5 mg total) by mouth 2 (two) times daily with a meal.   180 tablet   0   . desloratadine (CLARINEX) 5 MG tablet   Oral   Take 5 mg by mouth daily.         . diclofenac sodium (VOLTAREN) 1 % GEL   Topical   Apply 2 g topically 4 (four) times daily as needed (pain).          . ferrous sulfate (IRON SUPPLEMENT) 325 (65 FE) MG tablet   Oral   Take 325 mg by mouth daily with breakfast.         . furosemide (LASIX) 40 MG tablet   Oral   Take 1 tablet (40 mg total) by mouth 2 (two) times daily.   180 tablet   0   . gabapentin (NEURONTIN) 300 MG capsule   Oral   Take 300 mg by mouth at bedtime.          Marland Kitchen levothyroxine (SYNTHROID, LEVOTHROID) 300 MCG tablet   Oral   Take 300 mcg by mouth daily before breakfast.         . lisinopril (PRINIVIL,ZESTRIL) 10 MG tablet   Oral   Take 1 tablet (10 mg total) by mouth daily.   90 tablet   0   . lovastatin (MEVACOR) 40 MG tablet   Oral   Take 1 tablet (40 mg total) by mouth at bedtime.   90 tablet   0   . oxymetazoline (AFRIN) 0.05 % nasal spray   Nasal   Place 2 sprays into the nose daily as needed for congestion.         . potassium chloride SA (K-DUR,KLOR-CON) 20 MEQ tablet   Oral   Take 2 tablets (40 mEq total) by mouth 2 (two) times daily.   180 tablet   0   . ranitidine (ZANTAC) 150 MG tablet   Oral   Take 150 mg by mouth 2 (two)  times daily.           BP 103/68  Pulse 79  Temp(Src) 97.9 F (36.6 C) (Oral)  Resp 21  Wt 279 lb 5.2 oz  (126.7 kg)  BMI 43.74 kg/m2  SpO2 97% Physical Exam Gen: no acute distress, well nourished and well developed  Head: NCAT Eyes: PERL at 3mm, normal fundoscopic exam Nose: no lesions Mouth: oral mucosa appears well hydrated Neck: no jvd, no stridor Resp: CTA bilaterally, no wheezing, rales rhonchi CV: RRR, ext well perfused Abd: soft, nondistended Ext: 1+ symmetric pretibial edema Skin: no lesions Neuro: cn ii-xii grossly intact, 5/5 motor strength both arms and legs, no dysmetria   ED Course  Procedures (including critical care time)  Results for orders placed during the hospital encounter of 11/24/12 (from the past 24 hour(s))  GLUCOSE, CAPILLARY     Status: None   Collection Time    11/24/12  1:26 AM      Result Value Range   Glucose-Capillary 92  70 - 99 mg/dL  ETHANOL     Status: None   Collection Time    11/24/12  1:26 AM      Result Value Range   Alcohol, Ethyl (B) <11  0 - 11 mg/dL  PROTIME-INR     Status: None   Collection Time    11/24/12  1:26 AM      Result Value Range   Prothrombin Time 11.9  11.6 - 15.2 seconds   INR 0.89  0.00 - 1.49  APTT     Status: Abnormal   Collection Time    11/24/12  1:26 AM      Result Value Range   aPTT 21 (*) 24 - 37 seconds  CBC     Status: Abnormal   Collection Time    11/24/12  1:26 AM      Result Value Range   WBC 11.5 (*) 4.0 - 10.5 K/uL   RBC 4.67  3.87 - 5.11 MIL/uL   Hemoglobin 14.2  12.0 - 15.0 g/dL   HCT 84.6  96.2 - 95.2 %   MCV 91.2  78.0 - 100.0 fL   MCH 30.4  26.0 - 34.0 pg   MCHC 33.3  30.0 - 36.0 g/dL   RDW 84.1  32.4 - 40.1 %   Platelets 227  150 - 400 K/uL  DIFFERENTIAL     Status: Abnormal   Collection Time    11/24/12  1:26 AM      Result Value Range   Neutrophils Relative % 55  43 - 77 %   Lymphocytes Relative 32  12 - 46 %   Monocytes Relative 10  3 - 12 %   Eosinophils Relative 3  0 - 5 %   Basophils Relative 0  0 - 1 %   Neutro Abs 6.3  1.7 - 7.7 K/uL   Lymphs Abs 3.7  0.7 - 4.0 K/uL    Monocytes Absolute 1.2 (*) 0.1 - 1.0 K/uL   Eosinophils Absolute 0.3  0.0 - 0.7 K/uL   Basophils Absolute 0.0  0.0 - 0.1 K/uL   WBC Morphology ATYPICAL LYMPHOCYTES    COMPREHENSIVE METABOLIC PANEL     Status: Abnormal   Collection Time    11/24/12  1:26 AM      Result Value Range   Sodium 139  135 - 145 mEq/L   Potassium 3.9  3.5 - 5.1 mEq/L   Chloride 101  96 - 112 mEq/L  CO2 28  19 - 32 mEq/L   Glucose, Bld 98  70 - 99 mg/dL   BUN 12  6 - 23 mg/dL   Creatinine, Ser 1.61  0.50 - 1.10 mg/dL   Calcium 9.7  8.4 - 09.6 mg/dL   Total Protein 7.0  6.0 - 8.3 g/dL   Albumin 3.4 (*) 3.5 - 5.2 g/dL   AST 21  0 - 37 U/L   ALT 31  0 - 35 U/L   Alkaline Phosphatase 91  39 - 117 U/L   Total Bilirubin 0.3  0.3 - 1.2 mg/dL   GFR calc non Af Amer >90  >90 mL/min   GFR calc Af Amer >90  >90 mL/min  TROPONIN I     Status: None   Collection Time    11/24/12  1:26 AM      Result Value Range   Troponin I <0.30  <0.30 ng/mL  POCT I-STAT TROPONIN I     Status: None   Collection Time    11/24/12  1:27 AM      Result Value Range   Troponin i, poc 0.00  0.00 - 0.08 ng/mL   Comment 3           POCT I-STAT, CHEM 8     Status: Abnormal   Collection Time    11/24/12  1:29 AM      Result Value Range   Sodium 141  135 - 145 mEq/L   Potassium 3.9  3.5 - 5.1 mEq/L   Chloride 102  96 - 112 mEq/L   BUN 12  6 - 23 mg/dL   Creatinine, Ser 0.45  0.50 - 1.10 mg/dL   Glucose, Bld 99  70 - 99 mg/dL   Calcium, Ion 4.09 (*) 1.12 - 1.23 mmol/L   TCO2 28  0 - 100 mmol/L   Hemoglobin 14.6  12.0 - 15.0 g/dL   HCT 81.1  91.4 - 78.2 %  CG4 I-STAT (LACTIC ACID)     Status: None   Collection Time    11/24/12  1:30 AM      Result Value Range   Lactic Acid, Venous 1.32  0.5 - 2.2 mmol/L  GLUCOSE, CAPILLARY     Status: Abnormal   Collection Time    11/24/12  1:59 AM      Result Value Range   Glucose-Capillary 111 (*) 70 - 99 mg/dL   Imaging Review Ct Head Wo Contrast  11/24/2012   CLINICAL DATA:  Code  stroke. Left-sided sensory changes.  EXAM: CT HEAD WITHOUT CONTRAST  TECHNIQUE: Contiguous axial images were obtained from the base of the skull through the vertex without intravenous contrast.  COMPARISON:  11/15/2012.  FINDINGS: Skull:No acute osseous abnormality. No lytic or blastic lesion.  Orbits: No acute abnormality.  Brain: No evidence of acute abnormality, such as acute infarction, hemorrhage, hydrocephalus, or mass lesion/mass effect. Remote microvascular ischemic injuries to the peripheral left cerebellum. Intracranial calcific atherosclerosis.  These results were called by telephone at the time of interpretation on 11/24/2012 at 1:37 AMto Dr. Thad Ranger, who verbally acknowledged these results.  IMPRESSION: No evidence of acute intracranial disease.   Electronically Signed   By: Tiburcio Pea   On: 11/24/2012 01:41    MDM   ED work up is rather  Unremarkable. Patient in NSR. BP looks good. Complaints are actually more chronic in nature - headache is acute on chronic. Head CT is negative. The patient is stable for discharge.  Brandt Loosen, MD 12/13/12 [id number]

## 2012-11-24 NOTE — ED Notes (Signed)
Report per EMS. Pt started to feel nauseous and began to vomit, followed by a head ache in the back of her head. This reportedly occurred around 2130.Upon EMS arrival, pt was exhibiting left sided facial droop and left sided weakness. While en route, pt reported blurred vision.

## 2012-11-24 NOTE — ED Notes (Signed)
Call pt's daughter when she is ready to leave. Daughter's name is Remigio Eisenmenger, and her phone number is [phone].

## 2012-11-24 NOTE — ED Notes (Signed)
Attempted to call daughter x2. No answer 

## 2012-11-24 NOTE — ED Notes (Signed)
Manly, MD at bedside.  

## 2012-11-24 NOTE — Code Documentation (Signed)
52 yo wf brought in via [place] [facility] for stroke like symptoms.  Per pt she developed a HA around 2130 & Lt side weakness.  Prior to EMS arrival she vomited x2 & developed blurred vision.  Code stroke called 0112, pt arrival 0121, LKW 2130, EDP exam 0122, stroke team arrival 0117, neurologist arrival 0124, pt arrival in CT 0124, phlebotomist arrival 0121. NIH 3 for sensory, facial droop, & dysarthria.

## 2012-11-24 NOTE — Consult Note (Signed)
Referring Physician: Arnoldo Morale    Chief Complaint: Headache and left sided weakness  HPI: Melanie Cordova is an 52 y.o. female who has presented multiple times with complaints of left sided numbness and weakness (last 11/15/2012), presents this morning again with complaints of left sided numbness and weakness.  Patient also has complaint of headache.  Reports that the headache started initially while watching television.  It is described as sharp and occipital.  Not long after development of the headache she describes nausea and vomiting along with left sided numbness and weakness.  EMS was called and patient was brought in as a code stroke.    Date last known well: Date: 11/23/2012 Time last known well: Time: 21:30 tPA Given: No: Not felt to be a stroke, minor symptoms  Past Medical History  Diagnosis Date  . Hypertension     09/2010-normal CMet and CBC; Lipid profile-116, 88, 25, 73  . Cardiomyopathy 08/2010    Presented with congestive heart failure; EF of 15% and 2012; hypotension on medication precludes optimal dosing  . Gastroesophageal reflux disease   . Pneumonia   . Obesity   . Hyperlipidemia   . Tobacco abuse     20 pack years  . CHF (congestive heart failure)   . Asthma   . Shortness of breath   . Headache(784.0)   . Arthritis   . Anxiety   . Neuropathy   . 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 Chronic systolic heart failure   . Dysrhythmia   . Automatic implantable cardioverter-defibrillator in situ     2013, july  . COPD (chronic obstructive pulmonary disease)     2013  . Bell palsy     states has had 3 episodes  . Stroke      stroke in 07/2012 and another in June, 2014  . Seizures     last one at age 88  . Neuromuscular disorder     Neuropathy, right foot and leg  . Hypothyroidism     Recent TSH was normal.    Past Surgical History  Procedure Laterality Date  . Cesarean section      X2  . Tee without cardioversion N/A 07/11/2012     Procedure: TRANSESOPHAGEAL ECHOCARDIOGRAM (TEE);  Surgeon: Vesta Mixer, MD;  Location: [facility] ENDOSCOPY;  Service: Cardiovascular;  Laterality: N/A;  . Tubal ligation    . Multiple extractions with alveoloplasty N/A 09/24/2012    Procedure: MULTIPLE EXTRACION #2, 4, 6, 7 ,8, 9, 11, 13, 18, 20, 21, 22, 23, 24, 25, 26, 27, 29 WITH ALVEOLOPLASTY, BIOPSY OF PALATE LESION, REMOVA RIGHT LINGUAL TORUS;  Surgeon: Georgia Lopes, DDS;  Location: [facility] OR;  Service: Oral Surgery;  Laterality: N/A;    Family History  Problem Relation Age of Onset  . Cardiomyopathy Mother     ICD pacemaker-ischmic CM  . Heart failure Mother   . Hypertension Mother   . Cardiomyopathy Father     Deceased  . Coronary artery disease Father   . Heart failure Father   . Heart failure Brother   . Hypertension Brother    Social History:  reports that she has been smoking Cigarettes.  She has a 10.5 pack-year smoking history. She has never used smokeless tobacco. She reports that she does not drink alcohol or use illicit drugs.  Allergies:  Allergies  Allergen Reactions  . Fish Allergy Anaphylaxis  . Iodinated Diagnostic Agents Hives and Other (See Comments)    Pulmonary problems; no  frank respiratory arrest  . Iodine Hives and Other (See Comments)    Pulmonary Problems   . Lentil Anaphylaxis  . Penicillins Anaphylaxis and Shortness Of Breath    Hair loss  . Lipitor [Atorvastatin] Itching and Rash  . Spironolactone Rash  . Sulfa Antibiotics Other (See Comments)    Unknown    Medications: I have reviewed the patient's current medications. Prior to Admission:  Current outpatient prescriptions: acetaminophen (TYLENOL) 500 MG tablet, Take 1,000 mg by mouth every 6 (six) hours as needed for pain., Disp: , Rfl: ;   albuterol (PROVENTIL HFA;VENTOLIN HFA) 108 (90 BASE) MCG/ACT inhaler, Inhale 2 puffs into the lungs every 6 (six) hours as needed for wheezing or shortness of breath., Disp: 1 Inhaler, Rfl: 5;   aspirin 325 MG  tablet, Take 1 tablet (325 mg total) by mouth daily., Disp: 30 tablet, Rfl: 5 budesonide-formoterol (SYMBICORT) 80-4.5 MCG/ACT inhaler, Inhale 2 puffs into the lungs 2 (two) times daily., Disp: 1 Inhaler, Rfl: 6;  carvedilol (COREG) 25 MG tablet, Take 0.5 tablets (12.5 mg total) by mouth 2 (two) times daily with a meal., Disp: 180 tablet, Rfl: 0;  desloratadine (CLARINEX) 5 MG tablet, Take 5 mg by mouth daily., Disp: , Rfl:  diclofenac sodium (VOLTAREN) 1 % GEL, Apply 2 g topically 4 (four) times daily as needed (pain). , Disp: , Rfl: ;   ferrous sulfate (IRON SUPPLEMENT) 325 (65 FE) MG tablet, Take 325 mg by mouth daily with breakfast., Disp: , Rfl: ;   furosemide (LASIX) 40 MG tablet, Take 1 tablet (40 mg total) by mouth 2 (two) times daily., Disp: 180 tablet, Rfl: 0;   gabapentin (NEURONTIN) 300 MG capsule, Take 300 mg by mouth at bedtime. , Disp: , Rfl:  levothyroxine (SYNTHROID, LEVOTHROID) 150 MCG tablet, Take 2 tablets (300 mcg total) by mouth daily., Disp: 60 tablet, Rfl: 6;   lisinopril (PRINIVIL,ZESTRIL) 10 MG tablet, Take 1 tablet (10 mg total) by mouth daily., Disp: 90 tablet, Rfl: 0;   lovastatin (MEVACOR) 40 MG tablet, Take 1 tablet (40 mg total) by mouth at bedtime., Disp: 90 tablet, Rfl: 0 oxymetazoline (AFRIN) 0.05 % nasal spray, Place 2 sprays into the nose daily as needed for congestion., Disp: , Rfl: ;   potassium chloride SA (K-DUR,KLOR-CON) 20 MEQ tablet, Take 2 tablets (40 mEq total) by mouth 2 (two) times daily., Disp: 180 tablet, Rfl: 0;  ranitidine (ZANTAC) 150 MG tablet, Take 150 mg by mouth daily., Disp: , Rfl:   ROS: History obtained from the patient  General ROS: negative for - chills, fatigue, fever, night sweats, weight gain or weight loss Psychological ROS: negative for - behavioral disorder, hallucinations, memory difficulties, mood swings or suicidal ideation Ophthalmic ROS: negative for - blurry vision, double vision, eye pain or loss of vision ENT ROS: negative  for - epistaxis, nasal discharge, oral lesions, sore throat, tinnitus or vertigo Allergy and Immunology ROS: negative for - hives or itchy/watery eyes Hematological and Lymphatic ROS: negative for - bleeding problems, bruising or swollen lymph nodes Endocrine ROS: negative for - galactorrhea, hair pattern changes, polydipsia/polyuria or temperature intolerance Respiratory ROS: negative for - cough, hemoptysis, shortness of breath or wheezing Cardiovascular ROS: negative for - chest pain, dyspnea on exertion, edema or irregular heartbeat Gastrointestinal ROS: negative for - abdominal pain, diarrhea, hematemesis, nausea/vomiting or stool incontinence Genito-Urinary ROS: negative for - dysuria, hematuria, incontinence or urinary frequency/urgency Musculoskeletal ROS: back pain, left arm pain Neurological ROS: as noted in HPI Dermatological ROS: negative  for rash and skin lesion changes  Physical Examination: Blood pressure 99/66, pulse 54, temperature 98.4 F (36.9 C), temperature source Oral, resp. rate 18, SpO2 99.00%.  Neurologic Examination: Mental Status: Alert, oriented, thought content appropriate.  Speech fluent without evidence of aphasia.  Some mild dysarthria.  Able to follow 3 step commands without difficulty. Cranial Nerves: II: Discs flat bilaterally; Visual fields grossly normal, pupils equal, round, reactive to light and accommodation III,IV, VI: ptosis not present, extra-ocular motions intact bilaterally V,VII: Patient pulls mouth to the left (this is intermittent), facial light touch sensation decreased at the jaw line on the left VIII: hearing normal bilaterally IX,X: gag reflex present XI: bilateral shoulder shrug XII: midline tongue extension Motor: Right : Upper extremity   5/5    Left:     Upper extremity   5/5  Lower extremity   5/5     Lower extremity   5/5 Tone and bulk:normal tone throughout; no atrophy noted Sensory: Pinprick and light touch decreased in the  LUE splitting the midline at the upper chest Deep Tendon Reflexes: 2+ and symmetric throughout Plantars: Right: downgoing   Left: downgoing Cerebellar: normal finger-to-nose and normal heel-to-shin test Gait: Unable to test CV: pulses palpable throughout   Laboratory Studies:  Basic Metabolic Panel:  Recent Labs Lab 11/24/12 0129  NA 141  K 3.9  CL 102  GLUCOSE 99  BUN 12  CREATININE 1.00    Liver Function Tests: No results found for this basename: AST, ALT, ALKPHOS, BILITOT, PROT, ALBUMIN,  in the last 168 hours No results found for this basename: LIPASE, AMYLASE,  in the last 168 hours No results found for this basename: AMMONIA,  in the last 168 hours  CBC:  Recent Labs Lab 11/24/12 0129  HGB 14.6  HCT 43.0    Cardiac Enzymes: No results found for this basename: CKTOTAL, CKMB, CKMBINDEX, TROPONINI,  in the last 168 hours  BNP: No components found with this basename: POCBNP,   CBG:  Recent Labs Lab 11/24/12 0126  GLUCAP 92    Microbiology: Results for orders placed during the hospital encounter of 11/15/12  URINE CULTURE     Status: None   Collection Time    11/15/12  8:44 PM      Result Value Range Status   Specimen Description URINE, RANDOM   Final   Special Requests NONE   Final   Culture  Setup Time     Final   Value: 11/15/2012 22:14     Performed at [facility] Count     Final   Value: >=100,000 COLONIES/ML     Performed at [facility]   Culture     Final   Value: ESCHERICHIA COLI     Performed at [facility]   Report Status 11/17/2012 FINAL   Final   Organism ID, Bacteria ESCHERICHIA COLI   Final    Coagulation Studies: No results found for this basename: LABPROT, INR,  in the last 72 hours  Urinalysis: No results found for this basename: COLORURINE, APPERANCEUR, LABSPEC, PHURINE, GLUCOSEU, HGBUR, BILIRUBINUR, KETONESUR, PROTEINUR, UROBILINOGEN, NITRITE, LEUKOCYTESUR,  in the last 168  hours  Lipid Panel:    Component Value Date/Time   CHOL 179 10/04/2012 1153   TRIG 357* 10/04/2012 1153   HDL 38* 10/04/2012 1153   CHOLHDL 4.7 10/04/2012 1153   VLDL 71* 10/04/2012 1153   LDLCALC 70 10/04/2012 1153    HgbA1C:  Lab Results  Component  Value Date   HGBA1C 5.6 07/08/2012    Urine Drug Screen:     Component Value Date/Time   LABOPIA NONE DETECTED 11/15/2012 2044   COCAINSCRNUR NONE DETECTED 11/15/2012 2044   LABBENZ NONE DETECTED 11/15/2012 2044   AMPHETMU NONE DETECTED 11/15/2012 2044   THCU NONE DETECTED 11/15/2012 2044   LABBARB NONE DETECTED 11/15/2012 2044    Alcohol Level: No results found for this basename: ETH,  in the last 168 hours  Other results: EKG: sinus rhythm at 91 bpm with multiple PVC's.  Imaging: Ct Head Wo Contrast  11/24/2012   CLINICAL DATA:  Code stroke. Left-sided sensory changes.  EXAM: CT HEAD WITHOUT CONTRAST  TECHNIQUE: Contiguous axial images were obtained from the base of the skull through the vertex without intravenous contrast.  COMPARISON:  11/15/2012.  FINDINGS: Skull:No acute osseous abnormality. No lytic or blastic lesion.  Orbits: No acute abnormality.  Brain: No evidence of acute abnormality, such as acute infarction, hemorrhage, hydrocephalus, or mass lesion/mass effect. Remote microvascular ischemic injuries to the peripheral left cerebellum. Intracranial calcific atherosclerosis.  These results were called by telephone at the time of interpretation on 11/24/2012 at 1:37 AMto Dr. Thad Ranger, who verbally acknowledged these results.  IMPRESSION: No evidence of acute intracranial disease.   Electronically Signed   By: Tiburcio Pea   On: 11/24/2012 01:41    Assessment: 52 y.o. female presenting with headache and left sided numbness.  No weakness appreciated on examination.  CT reviewed and shows no acute changes.  Do not suspect an acute infarct at this time.  Patient may very well have a complicated migraine.  Anticoagulation not warranted at this  time.    Stroke Risk Factors - hyperlipidemia, hypertension and smoking  Plan: 1. Continue ASA 2.  Analgesics for headache.  Suspect symptoms to resolve as pain improves.  Once patient returns to baseline no further intervention recommended.     Thana Farr, MD [facility] [phone] 11/24/2012, 1:50 AM

## 2012-11-24 NOTE — ED Notes (Signed)
Attempted to call daughter x 1

## 2012-11-24 NOTE — ED Notes (Signed)
Spoke with the pt's sister. Stated that she would be unable to pick up the pt from the hospital because she is "under the influence of medication". This RN to ask to pt is she has someone else to call.

## 2012-11-26 ENCOUNTER — Ambulatory Visit (INDEPENDENT_AMBULATORY_CARE_PROVIDER_SITE_OTHER): Payer: Medicaid Other | Admitting: Cardiovascular Disease

## 2012-11-26 ENCOUNTER — Encounter: Payer: Self-pay | Admitting: Cardiovascular Disease

## 2012-11-26 VITALS — BP 118/67 | HR 43 | Ht 67.5 in | Wt 273.2 lb

## 2012-11-26 DIAGNOSIS — G43809 Other migraine, not intractable, without status migrainosus: Secondary | ICD-10-CM

## 2012-11-26 DIAGNOSIS — I429 Cardiomyopathy, unspecified: Secondary | ICD-10-CM

## 2012-11-26 DIAGNOSIS — G43909 Migraine, unspecified, not intractable, without status migrainosus: Secondary | ICD-10-CM

## 2012-11-26 DIAGNOSIS — I428 Other cardiomyopathies: Secondary | ICD-10-CM

## 2012-11-26 DIAGNOSIS — I10 Essential (primary) hypertension: Secondary | ICD-10-CM

## 2012-11-26 NOTE — Progress Notes (Signed)
Patient ID: Melanie Cordova, female   DOB: 04-11-1960, 52 y.o.   MRN: [id number]   SUBJECTIVE: Melanie Cordova is a 52 y.o. 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 She also has a nonischemic cardiomyopathy and is s/p ICD placement.  An echo done in April 2013 revealed the following:  - Left ventricle: The cavity size was moderately dilated. Wall thickness was increased in a pattern of mild LVH. The estimated ejection fraction was 15%. Diffuse hypokinesis. Features are consistent with a pseudonormal left ventricular filling pattern, with concomitant abnormal relaxation and increased filling pressure (grade 2 diastolic dysfunction). - Mitral valve: Severe regurgitation. - Left atrium: The atrium was mildly dilated.  She had a TEE two days later which showed only mild mitral regurgitation, and an EF of 25-30%.  She occasionally has a chest pressure over the upper left chest region, and then has left mouth drooping and slurring of speech.  She then gets pressure in the back of her head and jaw.  Her ECG shows sinus rhythm with multiple PVC's.  I reviewed all the labs from her recent ED visit.     Allergies  Allergen Reactions  . Fish Allergy Anaphylaxis  . Iodinated Diagnostic Agents Hives and Other (See Comments)    Pulmonary problems; no frank respiratory arrest  . Iodine Hives and Other (See Comments)    Pulmonary Problems   . Lentil Anaphylaxis  . Penicillins Anaphylaxis and Shortness Of Breath    Hair loss  . Lipitor [Atorvastatin] Itching and Rash  . Spironolactone Rash  . Sulfa Antibiotics Other (See Comments)    Unknown    Current Outpatient Prescriptions  Medication Sig Dispense Refill  . acetaminophen (TYLENOL) 500 MG tablet Take 1,000 mg by mouth every 6 (six) hours as needed for pain.      Marland Kitchen  albuterol (PROVENTIL HFA;VENTOLIN HFA) 108 (90 BASE) MCG/ACT inhaler Inhale 2 puffs into the lungs every 6 (six) hours as needed for wheezing or shortness of breath.  1 Inhaler  5  . aspirin 325 MG tablet Take 1 tablet (325 mg total) by mouth daily.  30 tablet  5  . budesonide-formoterol (SYMBICORT) 80-4.5 MCG/ACT inhaler Inhale 2 puffs into the lungs 2 (two) times daily.  1 Inhaler  6  . carvedilol (COREG) 25 MG tablet Take 0.5 tablets (12.5 mg total) by mouth 2 (two) times daily with a meal.  180 tablet  0  . desloratadine (CLARINEX) 5 MG tablet Take 5 mg by mouth daily.      . diclofenac sodium (VOLTAREN) 1 % GEL Apply 2 g topically 4 (four) times daily as needed (pain).       . ferrous sulfate (IRON SUPPLEMENT) 325 (65 FE) MG tablet Take 325 mg by mouth daily with breakfast.      . furosemide (LASIX) 40 MG tablet Take 1 tablet (40 mg total) by mouth 2 (two) times daily.  180 tablet  0  . gabapentin (NEURONTIN) 300 MG capsule Take 300 mg by mouth at bedtime.       Marland Kitchen levothyroxine (SYNTHROID, LEVOTHROID) 300 MCG tablet Take 300 mcg by mouth daily before breakfast.      . lisinopril (PRINIVIL,ZESTRIL) 10 MG tablet Take 1 tablet (10 mg total) by mouth daily.  90 tablet  0  . lovastatin (MEVACOR) 40 MG tablet  Take 1 tablet (40 mg total) by mouth at bedtime.  90 tablet  0  . oxymetazoline (AFRIN) 0.05 % nasal spray Place 2 sprays into the nose daily as needed for congestion.      . potassium chloride SA (K-DUR,KLOR-CON) 20 MEQ tablet Take 2 tablets (40 mEq total) by mouth 2 (two) times daily.  180 tablet  0  . ranitidine (ZANTAC) 150 MG tablet Take 150 mg by mouth 2 (two) times daily.        No current facility-administered medications for this visit.    Past Medical History  Diagnosis Date  . Hypertension     09/2010-normal CMet and CBC; Lipid profile-116, 88, 25, 73  . Cardiomyopathy 08/2010    Presented with congestive heart failure; EF of 15% and 2012; hypotension on medication precludes  optimal dosing  . Gastroesophageal reflux disease   . Pneumonia   . Obesity   . Hyperlipidemia   . Tobacco abuse     20 pack years  . CHF (congestive heart failure)   . Asthma   . Shortness of breath   . Headache(784.0)   . Arthritis   . Anxiety   . Neuropathy   . 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 Chronic systolic heart failure   . Dysrhythmia   . Automatic implantable cardioverter-defibrillator in situ     2013, july  . COPD (chronic obstructive pulmonary disease)     2013  . Bell palsy     states has had 3 episodes  . Stroke      stroke in 07/2012 and another in June, 2014  . Seizures     last one at age 62  . Neuromuscular disorder     Neuropathy, right foot and leg  . Hypothyroidism     Recent TSH was normal.    Past Surgical History  Procedure Laterality Date  . Cesarean section      X2  . Tee without cardioversion N/A 07/11/2012    Procedure: TRANSESOPHAGEAL ECHOCARDIOGRAM (TEE);  Surgeon: Vesta Mixer, MD;  Location: [facility] ENDOSCOPY;  Service: Cardiovascular;  Laterality: N/A;  . Tubal ligation    . Multiple extractions with alveoloplasty N/A 09/24/2012    Procedure: MULTIPLE EXTRACION #2, 4, 6, 7 ,8, 9, 11, 13, 18, 20, 21, 22, 23, 24, 25, 26, 27, 29 WITH ALVEOLOPLASTY, BIOPSY OF PALATE LESION, REMOVA RIGHT LINGUAL TORUS;  Surgeon: Georgia Lopes, DDS;  Location: [facility] OR;  Service: Oral Surgery;  Laterality: N/A;    History   Social History  . Marital Status: Divorced    Spouse Name: N/A    Number of Children: 2  . Years of Education: N/A   Occupational History  .      Works in Science writer   Social History Main Topics  . Smoking status: Current Every Day Smoker -- 0.25 packs/day for 21 years    Types: Cigarettes  . Smokeless tobacco: Never Used     Comment: 5 ciggs daily 11-26-12  . Alcohol Use: No  . Drug Use: No  . Sexual Activity: No   Other Topics Concern  . Not on file   Social History Narrative  . No  narrative on file    BP: 118/67 Pulse: 91  Height: 5' 7.5" (1.715 m) Weight: 273 lb 4 oz (123.945 kg)    HR: 70-80 bpm  PHYSICAL EXAM General: NAD Neck: No JVD, no thyromegaly or thyroid nodule.  Lungs: Clear to auscultation bilaterally with  normal respiratory effort. CV: Nondisplaced PMI.  Heart mostly regular rhythm with premature contractions noted, normal S1/S2, no S3/S4, no murmur.  No peripheral edema.  No carotid bruit.  Normal pedal pulses.  Abdomen: Soft, nontender, no hepatosplenomegaly, no distention.  Neurologic: Alert and oriented x 3.  Psych: Normal affect. Extremities: No clubbing or cyanosis.   ECG: reviewed and available in electronic records. (see above)      ASSESSMENT AND PLAN: 1. Nonischemic cardiomyopathy: currently compensated. She should be taking Coreg 12.5 mg bid and she is aware of this. 2. Neurologic: I wonder if her left mouth drooping with neck/head pain and slurred speech could be attributable to a complex migraine. I will try to have her arranged to see a neurologist. 3. HTN: controlled.   Prentice Docker, M.D., F.A.C.C.

## 2012-11-26 NOTE — Patient Instructions (Addendum)
Your physician recommends that you schedule a follow-up appointment in: ONE YEAR WITH Dr. Purvis Sheffield    Your physician recommends THAT YOU SEE A NEUROLOGIST Referral placed in pt chart, pt aware that someone will call about upcoming apt date/time

## 2012-11-28 ENCOUNTER — Encounter: Payer: Self-pay | Admitting: *Deleted

## 2012-12-13 ENCOUNTER — Telehealth: Payer: Self-pay | Admitting: *Deleted

## 2012-12-13 NOTE — Telephone Encounter (Signed)
FYI: message sent from Neurology office rep to advise the following  Our office has left messages for this pt on 11/28/12, 12/03/12, and 12/06/12 requesting a return call to get an appt scheduled. To date,she has not called our office back for an appt. Please contact our office if we can be of assistance in the future for this patient. Thank you for the referral! Sherri

## 2012-12-19 ENCOUNTER — Ambulatory Visit (INDEPENDENT_AMBULATORY_CARE_PROVIDER_SITE_OTHER): Payer: Medicaid Other | Admitting: *Deleted

## 2012-12-19 DIAGNOSIS — I428 Other cardiomyopathies: Secondary | ICD-10-CM

## 2012-12-19 DIAGNOSIS — I429 Cardiomyopathy, unspecified: Secondary | ICD-10-CM

## 2012-12-19 LAB — ICD DEVICE OBSERVATION
BRDY-0002RV: 40 {beats}/min
DEV-0020ICD: NEGATIVE
DEVICE MODEL ICD: 1022442
RV LEAD AMPLITUDE: 12 mv
RV LEAD THRESHOLD: 0.75 V
VENTRICULAR PACING ICD: 0 pct

## 2012-12-19 NOTE — Progress Notes (Signed)
In office ICD interrogation. Normal function. No changes made this session.

## 2012-12-27 ENCOUNTER — Telehealth: Payer: Self-pay | Admitting: *Deleted

## 2012-12-27 NOTE — Telephone Encounter (Signed)
Pt dropped of paper work for Dr Kirtland Bouchard to fill out so she can get a nurse to help her at home and she also needs [place] to try to work on getting her a chair to help her in and out of bath tub.

## 2012-12-28 IMAGING — CR DG CHEST 2V
2 series · 2 of 2 positions shown · non-contrast
Comparison: 10/31/2010

CLINICAL DATA: CHF, asthma, bronchitis, smoker, follow-up

CHEST - 2 VIEW

[view not recorded (1 of 2)]
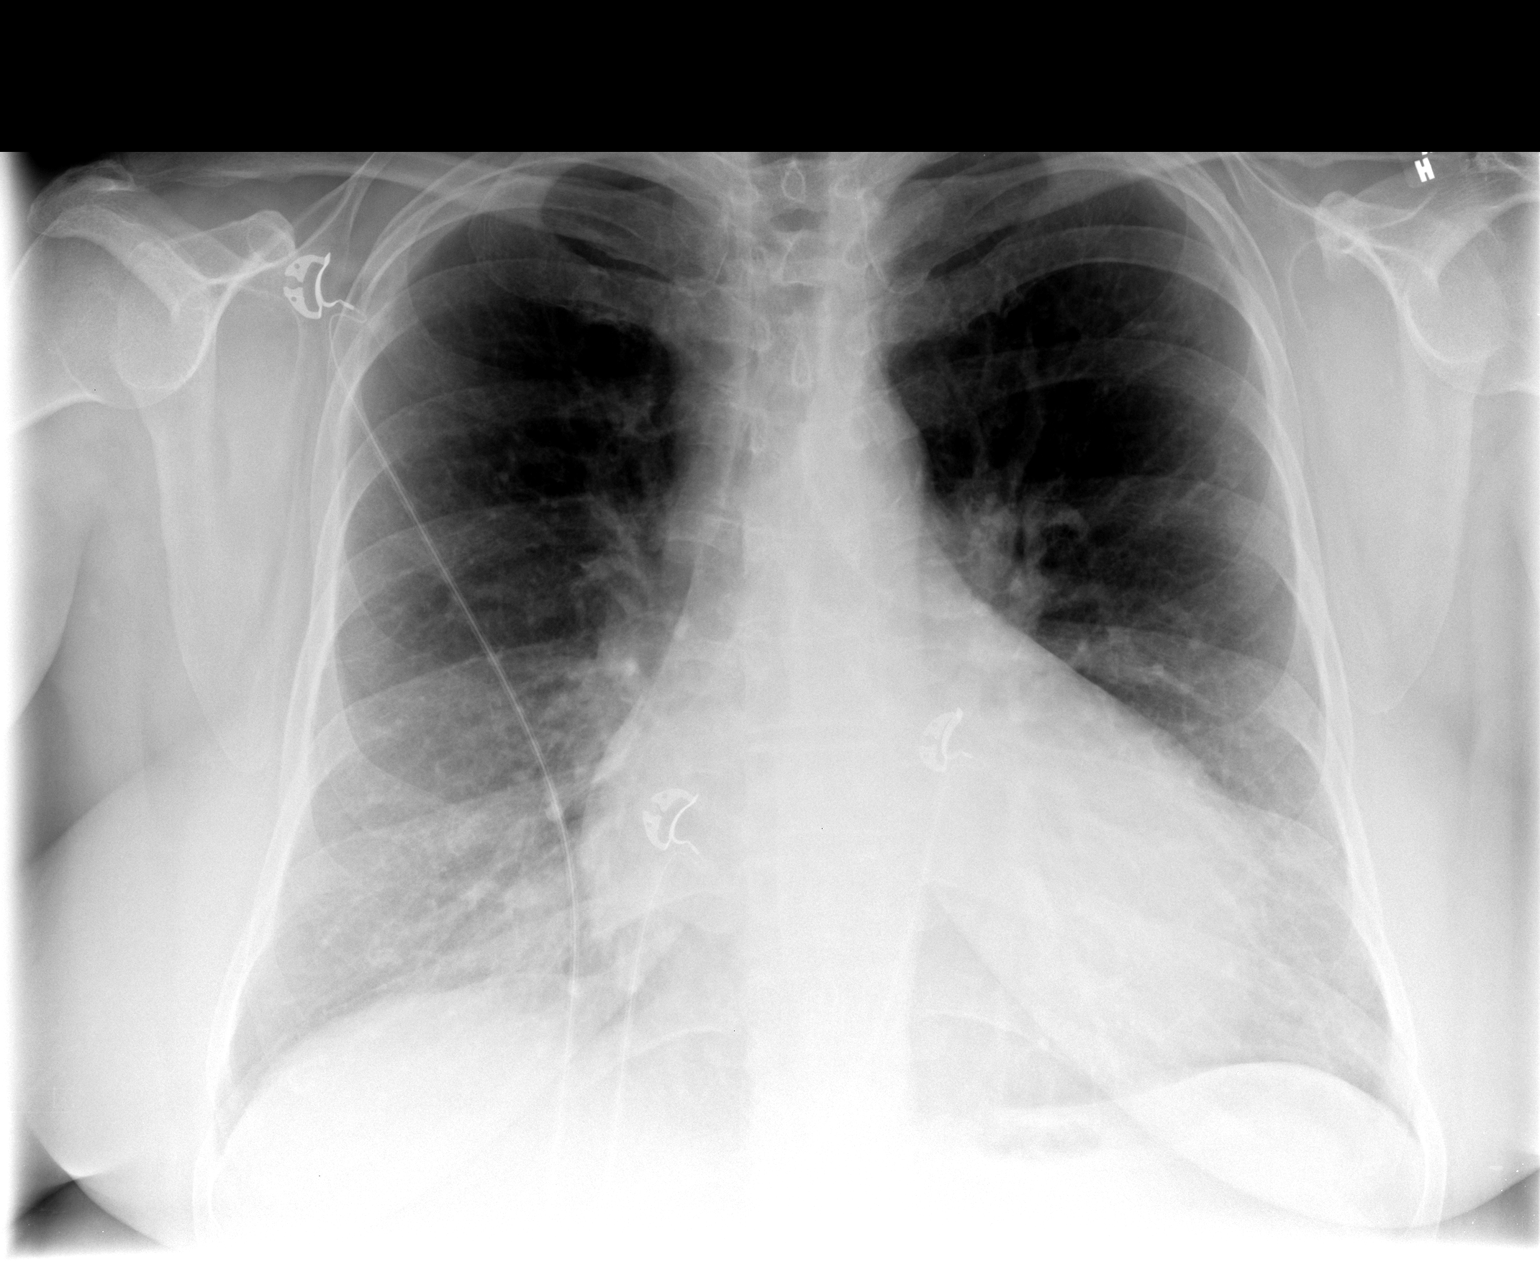

[view not recorded (2 of 2)]
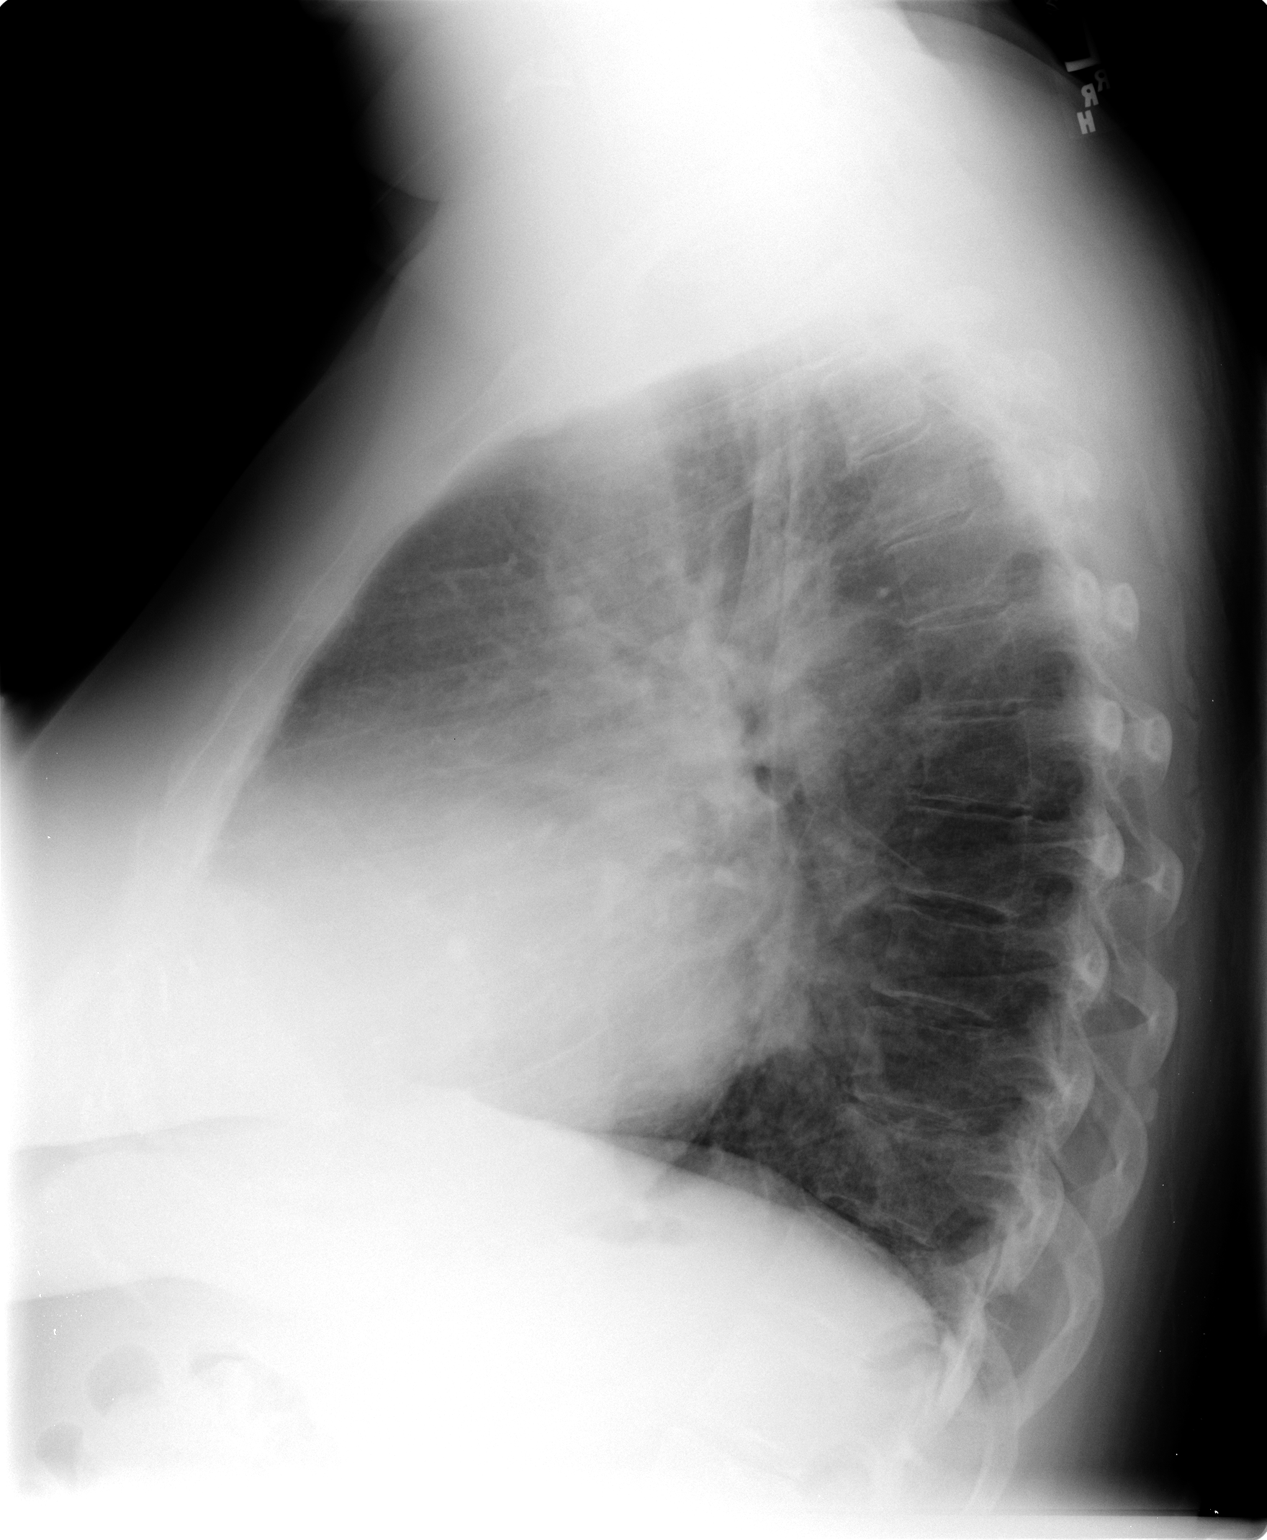

[2 of 2 positions shown; findings below may reference images not displayed]

FINDINGS: Enlargement cardiac silhouette.
Pulmonary vascular congestion.
Improved interstitial changes since previous exam compatible with
improving CHF.
Minimal atherosclerotic calcification aorta.
No gross pleural effusion or pneumothorax.
Bones appear demineralized.
IMPRESSION: Minimal CHF, improved.

## 2013-01-02 ENCOUNTER — Encounter: Payer: Self-pay | Admitting: Internal Medicine

## 2013-01-17 ENCOUNTER — Other Ambulatory Visit: Payer: Self-pay

## 2013-02-13 ENCOUNTER — Encounter (HOSPITAL_COMMUNITY): Payer: Self-pay | Admitting: Emergency Medicine

## 2013-02-13 ENCOUNTER — Emergency Department (HOSPITAL_COMMUNITY): Payer: Medicaid Other

## 2013-02-13 ENCOUNTER — Emergency Department (HOSPITAL_COMMUNITY)
Admission: EM | Admit: 2013-02-13 | Discharge: 2013-02-13 | Disposition: A | Discharge status: Home / Self Care | Payer: Medicaid Other | Attending: Emergency Medicine | Admitting: Emergency Medicine

## 2013-02-13 DIAGNOSIS — I10 Essential (primary) hypertension: Secondary | ICD-10-CM | POA: Insufficient documentation

## 2013-02-13 DIAGNOSIS — Z7982 Long term (current) use of aspirin: Secondary | ICD-10-CM | POA: Insufficient documentation

## 2013-02-13 DIAGNOSIS — Z880 Allergy status to penicillin: Secondary | ICD-10-CM | POA: Insufficient documentation

## 2013-02-13 DIAGNOSIS — F411 Generalized anxiety disorder: Secondary | ICD-10-CM | POA: Insufficient documentation

## 2013-02-13 DIAGNOSIS — R002 Palpitations: Secondary | ICD-10-CM

## 2013-02-13 DIAGNOSIS — E039 Hypothyroidism, unspecified: Secondary | ICD-10-CM | POA: Insufficient documentation

## 2013-02-13 DIAGNOSIS — Z95810 Presence of automatic (implantable) cardiac defibrillator: Secondary | ICD-10-CM | POA: Insufficient documentation

## 2013-02-13 DIAGNOSIS — J449 Chronic obstructive pulmonary disease, unspecified: Secondary | ICD-10-CM | POA: Insufficient documentation

## 2013-02-13 DIAGNOSIS — J4489 Other specified chronic obstructive pulmonary disease: Secondary | ICD-10-CM | POA: Insufficient documentation

## 2013-02-13 DIAGNOSIS — E785 Hyperlipidemia, unspecified: Secondary | ICD-10-CM | POA: Insufficient documentation

## 2013-02-13 DIAGNOSIS — R110 Nausea: Secondary | ICD-10-CM | POA: Insufficient documentation

## 2013-02-13 DIAGNOSIS — F17200 Nicotine dependence, unspecified, uncomplicated: Secondary | ICD-10-CM | POA: Insufficient documentation

## 2013-02-13 DIAGNOSIS — E669 Obesity, unspecified: Secondary | ICD-10-CM | POA: Insufficient documentation

## 2013-02-13 DIAGNOSIS — Z8669 Personal history of other diseases of the nervous system and sense organs: Secondary | ICD-10-CM | POA: Insufficient documentation

## 2013-02-13 DIAGNOSIS — Z8673 Personal history of transient ischemic attack (TIA), and cerebral infarction without residual deficits: Secondary | ICD-10-CM | POA: Insufficient documentation

## 2013-02-13 DIAGNOSIS — K219 Gastro-esophageal reflux disease without esophagitis: Secondary | ICD-10-CM | POA: Insufficient documentation

## 2013-02-13 DIAGNOSIS — M129 Arthropathy, unspecified: Secondary | ICD-10-CM | POA: Insufficient documentation

## 2013-02-13 DIAGNOSIS — I5022 Chronic systolic (congestive) heart failure: Secondary | ICD-10-CM | POA: Insufficient documentation

## 2013-02-13 DIAGNOSIS — Z79899 Other long term (current) drug therapy: Secondary | ICD-10-CM | POA: Insufficient documentation

## 2013-02-13 DIAGNOSIS — Z8701 Personal history of pneumonia (recurrent): Secondary | ICD-10-CM | POA: Insufficient documentation

## 2013-02-13 LAB — COMPREHENSIVE METABOLIC PANEL WITH GFR
ALT: 18 U/L (ref 0–35)
AST: 14 U/L (ref 0–37)
Albumin: 3.5 g/dL (ref 3.5–5.2)
Alkaline Phosphatase: 92 U/L (ref 39–117)
BUN: 13 mg/dL (ref 6–23)
CO2: 27 meq/L (ref 19–32)
Calcium: 9.3 mg/dL (ref 8.4–10.5)
Chloride: 99 meq/L (ref 96–112)
Creatinine, Ser: 0.78 mg/dL (ref 0.50–1.10)
GFR calc Af Amer: >90 mL/min
GFR calc non Af Amer: >90 mL/min
Glucose, Bld: 105 mg/dL — ABNORMAL HIGH (ref 70–99)
Potassium: 3.9 meq/L (ref 3.5–5.1)
Sodium: 136 meq/L (ref 135–145)
Total Bilirubin: 0.4 mg/dL (ref 0.3–1.2)
Total Protein: 7 g/dL (ref 6.0–8.3)

## 2013-02-13 LAB — CBC WITH DIFFERENTIAL/PLATELET
Basophils Absolute: 0.1 10*3/uL (ref 0.0–0.1)
Basophils Relative: 1 % (ref 0–1)
Eosinophils Absolute: 0.3 10*3/uL (ref 0.0–0.7)
Eosinophils Relative: 3 % (ref 0–5)
HCT: 41.7 % (ref 36.0–46.0)
Hemoglobin: 13.8 g/dL (ref 12.0–15.0)
Lymphocytes Relative: 33 % (ref 12–46)
Lymphs Abs: 3.3 10*3/uL (ref 0.7–4.0)
MCH: 29.9 pg (ref 26.0–34.0)
MCHC: 33.1 g/dL (ref 30.0–36.0)
MCV: 90.3 fL (ref 78.0–100.0)
Monocytes Absolute: 0.8 10*3/uL (ref 0.1–1.0)
Monocytes Relative: 8 % (ref 3–12)
Neutro Abs: 5.7 10*3/uL (ref 1.7–7.7)
Neutrophils Relative %: 55 % (ref 43–77)
Platelets: 251 10*3/uL (ref 150–400)
RBC: 4.62 MIL/uL (ref 3.87–5.11)
RDW: 13.7 % (ref 11.5–15.5)
WBC: 10.1 10*3/uL (ref 4.0–10.5)

## 2013-02-13 LAB — TROPONIN I
Troponin I: <0.3 ng/mL
Troponin I: <0.3 ng/mL

## 2013-02-13 MED ORDER — ASPIRIN 81 MG PO CHEW
324.0000 mg | CHEWABLE_TABLET | Freq: Once | ORAL | Status: AC
  Administered 2013-02-13: 324 mg via ORAL
  Filled 2013-02-13: qty 4

## 2013-02-13 MED ORDER — NITROGLYCERIN 2 % TD OINT
1.0000 [in_us] | TOPICAL_OINTMENT | Freq: Once | TRANSDERMAL | Status: AC
  Administered 2013-02-13: 1 [in_us] via TOPICAL
  Filled 2013-02-13: qty 1

## 2013-02-13 NOTE — ED Notes (Signed)
Lab in to collect blood for additional testing.

## 2013-02-13 NOTE — ED Provider Notes (Signed)
CSN: [id number]     Arrival date & time 02/13/13  0049 History   First MD Initiated Contact with Patient 02/13/13 0128     Chief Complaint  Patient presents with  . Palpitations   (Consider location/radiation/quality/duration/timing/severity/associated sxs/prior Treatment) HPI  Patient reports 8:30 PM she was watching TV and she "felt funny". She states she felt like her whole body was fluttering. She however denies shaking internally or externally. She states it lasted for about 10 or 15 minutes. She states she thought maybe her heart was fluttering. She states she feels like "I dove into the water and my body is swimming". She reports she's been having sinus problems and allergy problems. She states she has a pressure in the back of her head from her sinuses. She denies any blurred vision. She has nausea without vomiting or diarrhea. She has a chronic mild cough. She denies sore throat but states her throat felt numb. She denies feeling she was going to pass out or have dizziness. She denies any racing or skipping of her heart. At the very end of a long discussion she's been stated she had a pressure in the left side of her chest that lasted 30 minutes. She states she's had it before with her congestive heart failure. After that she adds she has a "ripping" sensation in her left chest, but not her back. She denies any shortness of breath or swelling of her legs. She states she's never felt this way before.  Patient recently had all of her chief extracted about a month ago. She states that 2 days ago she got her dentures and that the sealant to use on it made her feel nauseated. She subsequently is using a different adhesive.  Pt has a [employer] ICD  PCP Dr Felecia Shelling Cardiology Dr Darl Householder  Past Medical History  Diagnosis Date  . Hypertension     09/2010-normal CMet and CBC; Lipid profile-116, 88, 25, 73  . Cardiomyopathy 08/2010    Presented with congestive heart failure; EF of 15% and 2012;  hypotension on medication precludes optimal dosing  . Gastroesophageal reflux disease   . Pneumonia   . Obesity   . Hyperlipidemia   . Tobacco abuse     20 pack years  . CHF (congestive heart failure)   . Asthma   . Shortness of breath   . Headache(784.0)   . Arthritis   . Anxiety   . Neuropathy   . 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 Chronic systolic heart failure   . Dysrhythmia   . Automatic implantable cardioverter-defibrillator in situ     2013, july  . COPD (chronic obstructive pulmonary disease)     2013  . Bell palsy     states has had 3 episodes  . Stroke      stroke in 07/2012 and another in June, 2014  . Seizures     last one at age 63  . Neuromuscular disorder     Neuropathy, right foot and leg  . Hypothyroidism     Recent TSH was normal.   Past Surgical History  Procedure Laterality Date  . Cesarean section      X2  . Tee without cardioversion N/A 07/11/2012    Procedure: TRANSESOPHAGEAL ECHOCARDIOGRAM (TEE);  Surgeon: Vesta Mixer, MD;  Location[facility] ENDOSCOPY;  Service: Cardiovascular;  Laterality: N/A;  . Tubal ligation    . Multiple extractions with alveoloplasty N/A 09/24/2012    Procedure: MULTIPLE EXTRACION #2,  4, 6, 7 ,8, 9, 11, 13, 18, 20, 21, 22, 23, 24, 25, 26, 27, 29 WITH ALVEOLOPLASTY, BIOPSY OF PALATE LESION, REMOVA RIGHT LINGUAL TORUS;  Surgeon: Georgia Lopes, DDS;  Location: [facility] OR;  Service: Oral Surgery;  Laterality: N/A;   Family History  Problem Relation Age of Onset  . Cardiomyopathy Mother     ICD pacemaker-ischmic CM  . Heart failure Mother   . Hypertension Mother   . Cardiomyopathy Father     Deceased  . Coronary artery disease Father   . Heart failure Father   . Heart failure Brother   . Hypertension Brother    History  Substance Use Topics  . Smoking status: Current Every Day Smoker -- 0.25 packs/day for 21 years    Types: Cigarettes  . Smokeless tobacco: Never Used     Comment: 5 ciggs daily  11-26-12  . Alcohol Use: No   Lives alone Smokes 1/2 ppd   OB History   Grav Para Term Preterm Abortions TAB SAB Ect Mult Living                 Review of Systems  All other systems reviewed and are negative.    Allergies  Fish allergy; Iodinated diagnostic agents; Iodine; Lentil; Penicillins; Lipitor; Spironolactone; and Sulfa antibiotics  Home Medications   Current Outpatient Rx  Name  Route  Sig  Dispense  Refill  . acetaminophen (TYLENOL) 500 MG tablet   Oral   Take 1,000 mg by mouth every 6 (six) hours as needed for pain.         Marland Kitchen albuterol (PROVENTIL HFA;VENTOLIN HFA) 108 (90 BASE) MCG/ACT inhaler   Inhalation   Inhale 2 puffs into the lungs every 6 (six) hours as needed for wheezing or shortness of breath.   1 Inhaler   5   . aspirin 325 MG tablet   Oral   Take 1 tablet (325 mg total) by mouth daily.   30 tablet   5   . budesonide-formoterol (SYMBICORT) 80-4.5 MCG/ACT inhaler   Inhalation   Inhale 2 puffs into the lungs 2 (two) times daily.   1 Inhaler   6   . carvedilol (COREG) 25 MG tablet   Oral   Take 0.5 tablets (12.5 mg total) by mouth 2 (two) times daily with a meal.   180 tablet   0   . desloratadine (CLARINEX) 5 MG tablet   Oral   Take 5 mg by mouth daily.         . diclofenac sodium (VOLTAREN) 1 % GEL   Topical   Apply 2 g topically 4 (four) times daily as needed (pain).          . ferrous sulfate (IRON SUPPLEMENT) 325 (65 FE) MG tablet   Oral   Take 325 mg by mouth daily with breakfast.         . furosemide (LASIX) 40 MG tablet   Oral   Take 1 tablet (40 mg total) by mouth 2 (two) times daily.   180 tablet   0   . gabapentin (NEURONTIN) 300 MG capsule   Oral   Take 300 mg by mouth at bedtime.          Marland Kitchen levothyroxine (SYNTHROID, LEVOTHROID) 300 MCG tablet   Oral   Take 300 mcg by mouth daily before breakfast.         . lisinopril (PRINIVIL,ZESTRIL) 10 MG tablet   Oral   Take 1 tablet (10  mg total) by mouth  daily.   90 tablet   0   . lovastatin (MEVACOR) 40 MG tablet   Oral   Take 1 tablet (40 mg total) by mouth at bedtime.   90 tablet   0   . oxymetazoline (AFRIN) 0.05 % nasal spray   Nasal   Place 2 sprays into the nose daily as needed for congestion.         . potassium chloride SA (K-DUR,KLOR-CON) 20 MEQ tablet   Oral   Take 2 tablets (40 mEq total) by mouth 2 (two) times daily.   180 tablet   0   . ranitidine (ZANTAC) 150 MG tablet   Oral   Take 150 mg by mouth 2 (two) times daily.           BP 101/60  Pulse 81  Resp 21  Vital signs normal   Physical Exam  Nursing note and vitals reviewed. Constitutional: She is oriented to person, place, and time. She appears well-developed and well-nourished.  Non-toxic appearance. She does not appear ill. No distress.  HENT:  Head: Normocephalic and atraumatic.  Right Ear: External ear normal.  Left Ear: External ear normal.  Nose: Nose normal. No mucosal edema or rhinorrhea.  Mouth/Throat: Oropharynx is clear and moist and mucous membranes are normal. No dental abscesses or uvula swelling.  Eyes: Conjunctivae and EOM are normal. Pupils are equal, round, and reactive to light.  Neck: Normal range of motion and full passive range of motion without pain. Neck supple.  Cardiovascular: Normal rate, regular rhythm and normal heart sounds.  Exam reveals no gallop and no friction rub.   No murmur heard. Pulmonary/Chest: Effort normal and breath sounds normal. No respiratory distress. She has no wheezes. She has no rhonchi. She has no rales. She exhibits no tenderness and no crepitus.    Area of chest discomfort  Abdominal: Soft. Normal appearance and bowel sounds are normal. She exhibits no distension. There is no tenderness. There is no rebound and no guarding.  Musculoskeletal: Normal range of motion. She exhibits no edema and no tenderness.  Moves all extremities well.   Neurological: She is alert and oriented to person, place,  and time. She has normal strength. No cranial nerve deficit.  Skin: Skin is warm, dry and intact. No rash noted. No erythema. No pallor.  Psychiatric: Her speech is normal and behavior is normal. Her mood appears anxious.    ED Course  Procedures (including critical care time)  Medications  nitroGLYCERIN (NITROGLYN) 2 % ointment 1 inch (1 inch Topical Given 02/13/13 0237)  aspirin chewable tablet 324 mg (324 mg Oral Given 02/13/13 0237)     Recheck to discuss initial test results, pt states her discomfort is better.  Will check second troponin and if negative she can be discharged to follow up as outpatient with her cardiologist.   Labs Review Results for orders placed during the hospital encounter of 02/13/13  CBC WITH DIFFERENTIAL      Result Value Range   WBC 10.1  4.0 - 10.5 K/uL   RBC 4.62  3.87 - 5.11 MIL/uL   Hemoglobin 13.8  12.0 - 15.0 g/dL   HCT 16.1  09.6 - 04.5 %   MCV 90.3  78.0 - 100.0 fL   MCH 29.9  26.0 - 34.0 pg   MCHC 33.1  30.0 - 36.0 g/dL   RDW 40.9  81.1 - 91.4 %   Platelets 251  150 - 400 K/uL  Neutrophils Relative % 55  43 - 77 %   Neutro Abs 5.7  1.7 - 7.7 K/uL   Lymphocytes Relative 33  12 - 46 %   Lymphs Abs 3.3  0.7 - 4.0 K/uL   Monocytes Relative 8  3 - 12 %   Monocytes Absolute 0.8  0.1 - 1.0 K/uL   Eosinophils Relative 3  0 - 5 %   Eosinophils Absolute 0.3  0.0 - 0.7 K/uL   Basophils Relative 1  0 - 1 %   Basophils Absolute 0.1  0.0 - 0.1 K/uL  COMPREHENSIVE METABOLIC PANEL      Result Value Range   Sodium 136  135 - 145 mEq/L   Potassium 3.9  3.5 - 5.1 mEq/L   Chloride 99  96 - 112 mEq/L   CO2 27  19 - 32 mEq/L   Glucose, Bld 105 (*) 70 - 99 mg/dL   BUN 13  6 - 23 mg/dL   Creatinine, Ser 1.61  0.50 - 1.10 mg/dL   Calcium 9.3  8.4 - 09.6 mg/dL   Total Protein 7.0  6.0 - 8.3 g/dL   Albumin 3.5  3.5 - 5.2 g/dL   AST 14  0 - 37 U/L   ALT 18  0 - 35 U/L   Alkaline Phosphatase 92  39 - 117 U/L   Total Bilirubin 0.4  0.3 - 1.2 mg/dL   GFR  calc non Af Amer >90  >90 mL/min   GFR calc Af Amer >90  >90 mL/min  TROPONIN I      Result Value Range   Troponin I <0.30  <0.30 ng/mL  TROPONIN I      Result Value Range   Troponin I <0.30  <0.30 ng/mL   Laboratory interpretation all normal      Imaging Review Dg Chest 2 View  02/13/2013   CLINICAL DATA:  Palpitations  EXAM: CHEST  2 VIEW  COMPARISON:  07/27/2012  FINDINGS: Heart size upper normal, similar to prior. Mediastinal contours are otherwise within normal range. Aortic atherosclerosis. Left chest wall battery pack with lead tip projecting over the expected position of the right ventricle. No confluent airspace opacity. No definite pleural effusion. No pneumothorax. No acute osseous finding.  IMPRESSION: Upper normal heart size.  No acute process identified   Electronically Signed   By: Jearld Lesch M.D.   On: 02/13/2013 02:19    EKG Interpretation   None       Date: 02/13/2013  Rate: 79  Rhythm: normal sinus rhythm  QRS Axis: normal  Intervals: QT prolonged  ST/T Wave abnormalities: nonspecific ST/T changes  Conduction Disutrbances:none  Narrative Interpretation:   Old EKG Reviewed: unchanged from 11/24/2012 except PVC's no longer present.      MDM  patient presents with some atypical discomfort that is hard to clarify. She added on  as an after thought that she was having some chest pressure. Her 2 sets of enzymes are negative. She's been discharged home to followup with her cardiologist.    1. Palpitations    Plan discharge   Devoria Albe, MD, Franz Dell, MD 02/13/13 [phone]

## 2013-02-13 NOTE — ED Notes (Signed)
Patient resting comfortably in bed; awaiting repeat troponin and disposition.

## 2013-02-13 NOTE — ED Notes (Signed)
Patient states was watching TV and began having a warm, fluttering feeling in her chest.  States has had several episodes of shortness of breath and nausea.

## 2013-02-13 NOTE — ED Notes (Signed)
Patient resting with eyes closed; equal rise and fall of chest noted.

## 2013-03-26 ENCOUNTER — Encounter: Payer: Self-pay | Admitting: *Deleted

## 2013-04-29 ENCOUNTER — Observation Stay (HOSPITAL_COMMUNITY): Payer: Medicaid Other

## 2013-04-29 ENCOUNTER — Emergency Department (HOSPITAL_COMMUNITY): Payer: Medicaid Other

## 2013-04-29 ENCOUNTER — Observation Stay (HOSPITAL_COMMUNITY)
Admission: EM | Admit: 2013-04-29 | Discharge: 2013-04-30 | Disposition: A | Discharge status: Home / Self Care | Payer: Medicaid Other | Attending: Internal Medicine | Admitting: Internal Medicine

## 2013-04-29 ENCOUNTER — Encounter (HOSPITAL_COMMUNITY): Payer: Self-pay | Admitting: Emergency Medicine

## 2013-04-29 DIAGNOSIS — R51 Headache: Secondary | ICD-10-CM

## 2013-04-29 DIAGNOSIS — F1010 Alcohol abuse, uncomplicated: Secondary | ICD-10-CM | POA: Insufficient documentation

## 2013-04-29 DIAGNOSIS — Z6841 Body Mass Index (BMI) 40.0 and over, adult: Secondary | ICD-10-CM | POA: Insufficient documentation

## 2013-04-29 DIAGNOSIS — I5023 Acute on chronic systolic (congestive) heart failure: Secondary | ICD-10-CM | POA: Insufficient documentation

## 2013-04-29 DIAGNOSIS — R29810 Facial weakness: Secondary | ICD-10-CM | POA: Insufficient documentation

## 2013-04-29 DIAGNOSIS — I517 Cardiomegaly: Secondary | ICD-10-CM

## 2013-04-29 DIAGNOSIS — E785 Hyperlipidemia, unspecified: Secondary | ICD-10-CM | POA: Insufficient documentation

## 2013-04-29 DIAGNOSIS — J4489 Other specified chronic obstructive pulmonary disease: Secondary | ICD-10-CM | POA: Insufficient documentation

## 2013-04-29 DIAGNOSIS — J45909 Unspecified asthma, uncomplicated: Secondary | ICD-10-CM | POA: Diagnosis present

## 2013-04-29 DIAGNOSIS — G589 Mononeuropathy, unspecified: Secondary | ICD-10-CM | POA: Insufficient documentation

## 2013-04-29 DIAGNOSIS — K069 Disorder of gingiva and edentulous alveolar ridge, unspecified: Secondary | ICD-10-CM

## 2013-04-29 DIAGNOSIS — I10 Essential (primary) hypertension: Secondary | ICD-10-CM | POA: Insufficient documentation

## 2013-04-29 DIAGNOSIS — D472 Monoclonal gammopathy: Secondary | ICD-10-CM | POA: Insufficient documentation

## 2013-04-29 DIAGNOSIS — G40909 Epilepsy, unspecified, not intractable, without status epilepticus: Secondary | ICD-10-CM | POA: Insufficient documentation

## 2013-04-29 DIAGNOSIS — R4789 Other speech disturbances: Secondary | ICD-10-CM

## 2013-04-29 DIAGNOSIS — Z720 Tobacco use: Secondary | ICD-10-CM

## 2013-04-29 DIAGNOSIS — G43809 Other migraine, not intractable, without status migrainosus: Secondary | ICD-10-CM

## 2013-04-29 DIAGNOSIS — K219 Gastro-esophageal reflux disease without esophagitis: Secondary | ICD-10-CM | POA: Insufficient documentation

## 2013-04-29 DIAGNOSIS — E876 Hypokalemia: Secondary | ICD-10-CM

## 2013-04-29 DIAGNOSIS — G459 Transient cerebral ischemic attack, unspecified: Secondary | ICD-10-CM

## 2013-04-29 DIAGNOSIS — R45851 Suicidal ideations: Secondary | ICD-10-CM | POA: Insufficient documentation

## 2013-04-29 DIAGNOSIS — F1410 Cocaine abuse, uncomplicated: Secondary | ICD-10-CM | POA: Insufficient documentation

## 2013-04-29 DIAGNOSIS — E669 Obesity, unspecified: Secondary | ICD-10-CM | POA: Insufficient documentation

## 2013-04-29 DIAGNOSIS — I428 Other cardiomyopathies: Secondary | ICD-10-CM

## 2013-04-29 DIAGNOSIS — E039 Hypothyroidism, unspecified: Secondary | ICD-10-CM | POA: Insufficient documentation

## 2013-04-29 DIAGNOSIS — Z91199 Patient's noncompliance with other medical treatment and regimen due to unspecified reason: Secondary | ICD-10-CM | POA: Insufficient documentation

## 2013-04-29 DIAGNOSIS — B1920 Unspecified viral hepatitis C without hepatic coma: Secondary | ICD-10-CM | POA: Insufficient documentation

## 2013-04-29 DIAGNOSIS — F319 Bipolar disorder, unspecified: Secondary | ICD-10-CM | POA: Insufficient documentation

## 2013-04-29 DIAGNOSIS — M6281 Muscle weakness (generalized): Principal | ICD-10-CM | POA: Insufficient documentation

## 2013-04-29 DIAGNOSIS — Z95810 Presence of automatic (implantable) cardiac defibrillator: Secondary | ICD-10-CM | POA: Insufficient documentation

## 2013-04-29 DIAGNOSIS — Z9119 Patient's noncompliance with other medical treatment and regimen: Secondary | ICD-10-CM | POA: Insufficient documentation

## 2013-04-29 DIAGNOSIS — J449 Chronic obstructive pulmonary disease, unspecified: Secondary | ICD-10-CM | POA: Insufficient documentation

## 2013-04-29 DIAGNOSIS — I429 Cardiomyopathy, unspecified: Secondary | ICD-10-CM | POA: Diagnosis present

## 2013-04-29 DIAGNOSIS — I509 Heart failure, unspecified: Secondary | ICD-10-CM | POA: Insufficient documentation

## 2013-04-29 DIAGNOSIS — F17200 Nicotine dependence, unspecified, uncomplicated: Secondary | ICD-10-CM | POA: Insufficient documentation

## 2013-04-29 DIAGNOSIS — Z7902 Long term (current) use of antithrombotics/antiplatelets: Secondary | ICD-10-CM | POA: Insufficient documentation

## 2013-04-29 DIAGNOSIS — R4781 Slurred speech: Secondary | ICD-10-CM

## 2013-04-29 DIAGNOSIS — F411 Generalized anxiety disorder: Secondary | ICD-10-CM | POA: Insufficient documentation

## 2013-04-29 DIAGNOSIS — R079 Chest pain, unspecified: Secondary | ICD-10-CM

## 2013-04-29 DIAGNOSIS — R531 Weakness: Secondary | ICD-10-CM

## 2013-04-29 LAB — COMPREHENSIVE METABOLIC PANEL
ALT: 20 U/L (ref 0–35)
AST: 18 U/L (ref 0–37)
Albumin: 3.6 g/dL (ref 3.5–5.2)
Alkaline Phosphatase: 113 U/L (ref 39–117)
BILIRUBIN TOTAL: 0.3 mg/dL (ref 0.3–1.2)
BUN: 9 mg/dL (ref 6–23)
CHLORIDE: 97 meq/L (ref 96–112)
CO2: 27 meq/L (ref 19–32)
Calcium: 9.1 mg/dL (ref 8.4–10.5)
Creatinine, Ser: 0.94 mg/dL (ref 0.50–1.10)
GFR calc Af Amer: 80 mL/min — ABNORMAL LOW (ref >90)
GFR, EST NON AFRICAN AMERICAN: 69 mL/min — AB (ref >90)
Glucose, Bld: 103 mg/dL — ABNORMAL HIGH (ref 70–99)
Potassium: 4.4 mEq/L (ref 3.7–5.3)
Sodium: 138 mEq/L (ref 137–147)
Total Protein: 7.2 g/dL (ref 6.0–8.3)

## 2013-04-29 LAB — URINALYSIS, ROUTINE W REFLEX MICROSCOPIC
Bilirubin Urine: NEGATIVE
Glucose, UA: NEGATIVE mg/dL
Hgb urine dipstick: NEGATIVE
Ketones, ur: NEGATIVE mg/dL
Nitrite: NEGATIVE
PH: 6 (ref 5.0–8.0)
PROTEIN: NEGATIVE mg/dL
Specific Gravity, Urine: 1.007 (ref 1.005–1.030)
Urobilinogen, UA: 0.2 mg/dL (ref 0.0–1.0)

## 2013-04-29 LAB — POCT I-STAT, CHEM 8
BUN: 7 mg/dL (ref 6–23)
CALCIUM ION: 1.16 mmol/L (ref 1.12–1.23)
CHLORIDE: 97 meq/L (ref 96–112)
Creatinine, Ser: 1.1 mg/dL (ref 0.50–1.10)
Glucose, Bld: 106 mg/dL — ABNORMAL HIGH (ref 70–99)
HCT: 48 % — ABNORMAL HIGH (ref 36.0–46.0)
Hemoglobin: 16.3 g/dL — ABNORMAL HIGH (ref 12.0–15.0)
Potassium: 4.3 mEq/L (ref 3.7–5.3)
Sodium: 139 mEq/L (ref 137–147)
TCO2: 29 mmol/L (ref 0–100)

## 2013-04-29 LAB — URINE MICROSCOPIC-ADD ON

## 2013-04-29 LAB — DIFFERENTIAL
BASOS ABS: 0.1 10*3/uL (ref 0.0–0.1)
Basophils Relative: 1 % (ref 0–1)
Eosinophils Absolute: 0.6 10*3/uL (ref 0.0–0.7)
Eosinophils Relative: 4 % (ref 0–5)
LYMPHS PCT: 26 % (ref 12–46)
Lymphs Abs: 3.5 10*3/uL (ref 0.7–4.0)
Monocytes Absolute: 0.7 10*3/uL (ref 0.1–1.0)
Monocytes Relative: 5 % (ref 3–12)
NEUTROS ABS: 8.8 10*3/uL — AB (ref 1.7–7.7)
Neutrophils Relative %: 65 % (ref 43–77)

## 2013-04-29 LAB — TROPONIN I
Troponin I: <0.3 ng/mL (ref <0.30)
Troponin I: <0.3 ng/mL (ref <0.30)
Troponin I: <0.3 ng/mL (ref <0.30)

## 2013-04-29 LAB — CBC
HEMATOCRIT: 44.4 % (ref 36.0–46.0)
Hemoglobin: 15.1 g/dL — ABNORMAL HIGH (ref 12.0–15.0)
MCH: 31.1 pg (ref 26.0–34.0)
MCHC: 34 g/dL (ref 30.0–36.0)
MCV: 91.4 fL (ref 78.0–100.0)
Platelets: 294 10*3/uL (ref 150–400)
RBC: 4.86 MIL/uL (ref 3.87–5.11)
RDW: 15.3 % (ref 11.5–15.5)
WBC: 13.7 10*3/uL — AB (ref 4.0–10.5)

## 2013-04-29 LAB — GLUCOSE, CAPILLARY
GLUCOSE-CAPILLARY: 81 mg/dL (ref 70–99)
Glucose-Capillary: 99 mg/dL (ref 70–99)
Glucose-Capillary: 100 mg/dL — ABNORMAL HIGH (ref 70–99)

## 2013-04-29 LAB — HEMOGLOBIN A1C
HEMOGLOBIN A1C: 5.7 % — AB (ref <5.7)
Mean Plasma Glucose: 117 mg/dL — ABNORMAL HIGH (ref <117)

## 2013-04-29 LAB — PROTIME-INR
INR: 0.93 (ref 0.00–1.49)
Prothrombin Time: 12.3 seconds (ref 11.6–15.2)

## 2013-04-29 LAB — APTT: aPTT: 28 seconds (ref 24–37)

## 2013-04-29 LAB — POCT I-STAT TROPONIN I: TROPONIN I, POC: 0.02 ng/mL (ref 0.00–0.08)

## 2013-04-29 MED ORDER — IPRATROPIUM BROMIDE 0.02 % IN SOLN
0.5000 mg | Freq: Four times a day (QID) | RESPIRATORY_TRACT | Status: DC
  Administered 2013-04-29: 0.5 mg via RESPIRATORY_TRACT
  Filled 2013-04-29: qty 2.5

## 2013-04-29 MED ORDER — GUAIFENESIN ER 600 MG PO TB12
600.0000 mg | ORAL_TABLET | Freq: Two times a day (BID) | ORAL | Status: DC
  Administered 2013-04-29 – 2013-04-30 (×3): 600 mg via ORAL
  Filled 2013-04-29 (×4): qty 1

## 2013-04-29 MED ORDER — ALBUTEROL SULFATE HFA 108 (90 BASE) MCG/ACT IN AERS: 2.0000 | INHALATION_SPRAY | RESPIRATORY_TRACT | Status: DC | PRN

## 2013-04-29 MED ORDER — BUDESONIDE-FORMOTEROL FUMARATE 80-4.5 MCG/ACT IN AERO
2.0000 | INHALATION_SPRAY | Freq: Two times a day (BID) | RESPIRATORY_TRACT | Status: DC
  Administered 2013-04-29: 2 via RESPIRATORY_TRACT
  Filled 2013-04-29: qty 6.9

## 2013-04-29 MED ORDER — IPRATROPIUM-ALBUTEROL 0.5-2.5 (3) MG/3ML IN SOLN
3.0000 mL | Freq: Three times a day (TID) | RESPIRATORY_TRACT | Status: DC
  Administered 2013-04-30 (×2): 3 mL via RESPIRATORY_TRACT
  Filled 2013-04-29 (×2): qty 3

## 2013-04-29 MED ORDER — ASPIRIN 325 MG PO TABS
325.0000 mg | ORAL_TABLET | Freq: Every day | ORAL | Status: DC
  Administered 2013-04-29 – 2013-04-30 (×2): 325 mg via ORAL
  Filled 2013-04-29 (×2): qty 1

## 2013-04-29 MED ORDER — FUROSEMIDE 40 MG PO TABS
40.0000 mg | ORAL_TABLET | Freq: Two times a day (BID) | ORAL | Status: DC
  Administered 2013-04-29 – 2013-04-30 (×3): 40 mg via ORAL
  Filled 2013-04-29 (×6): qty 1

## 2013-04-29 MED ORDER — DOXYCYCLINE HYCLATE 100 MG PO TABS
100.0000 mg | ORAL_TABLET | Freq: Two times a day (BID) | ORAL | Status: DC
  Administered 2013-04-29 – 2013-04-30 (×3): 100 mg via ORAL
  Filled 2013-04-29 (×4): qty 1

## 2013-04-29 MED ORDER — ACETAMINOPHEN 325 MG PO TABS: 650.0000 mg | ORAL_TABLET | ORAL | Status: DC | PRN

## 2013-04-29 MED ORDER — ALBUTEROL SULFATE HFA 108 (90 BASE) MCG/ACT IN AERS: 2.0000 | INHALATION_SPRAY | Freq: Four times a day (QID) | RESPIRATORY_TRACT | Status: DC | PRN

## 2013-04-29 MED ORDER — FAMOTIDINE 20 MG PO TABS
20.0000 mg | ORAL_TABLET | Freq: Every day | ORAL | Status: DC
  Administered 2013-04-29 – 2013-04-30 (×2): 20 mg via ORAL
  Filled 2013-04-29 (×2): qty 1

## 2013-04-29 MED ORDER — LORATADINE 10 MG PO TABS
10.0000 mg | ORAL_TABLET | Freq: Every day | ORAL | Status: DC
  Administered 2013-04-29 – 2013-04-30 (×2): 10 mg via ORAL
  Filled 2013-04-29 (×2): qty 1

## 2013-04-29 MED ORDER — PREDNISOLONE 5 MG PO TABS: 20.0000 mg | ORAL_TABLET | Freq: Every day | ORAL | Status: DC

## 2013-04-29 MED ORDER — ALBUTEROL SULFATE (2.5 MG/3ML) 0.083% IN NEBU: 2.5000 mg | INHALATION_SOLUTION | RESPIRATORY_TRACT | Status: DC | PRN

## 2013-04-29 MED ORDER — PREDNISONE 50 MG PO TABS
60.0000 mg | ORAL_TABLET | Freq: Two times a day (BID) | ORAL | Status: DC
  Administered 2013-04-29: 60 mg via ORAL
  Filled 2013-04-29 (×3): qty 1

## 2013-04-29 MED ORDER — LISINOPRIL 10 MG PO TABS
10.0000 mg | ORAL_TABLET | Freq: Every day | ORAL | Status: DC
  Administered 2013-04-29 – 2013-04-30 (×2): 10 mg via ORAL
  Filled 2013-04-29 (×2): qty 1

## 2013-04-29 MED ORDER — PREDNISONE 20 MG PO TABS
20.0000 mg | ORAL_TABLET | Freq: Every day | ORAL | Status: DC
  Administered 2013-04-30: 20 mg via ORAL
  Filled 2013-04-29 (×3): qty 1

## 2013-04-29 MED ORDER — SIMVASTATIN 20 MG PO TABS
20.0000 mg | ORAL_TABLET | Freq: Every day | ORAL | Status: DC
  Administered 2013-04-29: 20 mg via ORAL
  Filled 2013-04-29 (×2): qty 1

## 2013-04-29 MED ORDER — FERROUS SULFATE 325 (65 FE) MG PO TABS
325.0000 mg | ORAL_TABLET | Freq: Every day | ORAL | Status: DC
  Administered 2013-04-30: 325 mg via ORAL
  Filled 2013-04-29 (×3): qty 1

## 2013-04-29 MED ORDER — LEVOTHYROXINE SODIUM 200 MCG PO TABS
200.0000 ug | ORAL_TABLET | Freq: Every day | ORAL | Status: DC
  Administered 2013-04-30: 200 ug via ORAL
  Filled 2013-04-29 (×4): qty 1

## 2013-04-29 MED ORDER — GABAPENTIN 300 MG PO CAPS
300.0000 mg | ORAL_CAPSULE | Freq: Every day | ORAL | Status: DC
  Administered 2013-04-29: 300 mg via ORAL
  Filled 2013-04-29 (×3): qty 1

## 2013-04-29 MED ORDER — ASPIRIN 300 MG RE SUPP
300.0000 mg | Freq: Every day | RECTAL | Status: DC
  Administered 2013-04-29: 300 mg via RECTAL
  Filled 2013-04-29 (×2): qty 1

## 2013-04-29 MED ORDER — PANTOPRAZOLE SODIUM 40 MG PO TBEC
40.0000 mg | DELAYED_RELEASE_TABLET | Freq: Every day | ORAL | Status: DC
  Administered 2013-04-29 – 2013-04-30 (×2): 40 mg via ORAL
  Filled 2013-04-29 (×2): qty 1

## 2013-04-29 MED ORDER — POTASSIUM CHLORIDE CRYS ER 20 MEQ PO TBCR
40.0000 meq | EXTENDED_RELEASE_TABLET | Freq: Two times a day (BID) | ORAL | Status: DC
  Administered 2013-04-29 – 2013-04-30 (×3): 40 meq via ORAL
  Filled 2013-04-29 (×5): qty 2

## 2013-04-29 NOTE — Consult Note (Signed)
Referring Physician: ED    Chief Complaint: CODE STROKE: DYSARTHRIA, right facial droop, HA  HPI:                                                                                                                                         Melanie Cordova is an 53 y.o. female with a past medical history significant for HTN, hyperlipidemia, obesity, heavy smoking,  MI, chronic congestive heart failure, stroke/TIA with similar presentation and no residual deficits, s/p ICD, asthma, GERD, Bell's palsy, anxiety, brought to [facility] ED by ambulance as a code stroke due to the above mentioned symptoms. Last known well at 11 pm last night. EMS reported that there was some type of heated argument or a fight in the house ant that they got a call from patient's grandchildren stating that Melanie Cordova was having difficulty with her speech as well as right face droopiness. She told EMS that she first had CP and subsequently developed slurred speech and HA. However, no upper or lower extremity weakness was noted and she denied double vision, difficulty swallowing, confusion, or unsteadiness. Upon arrival to ED she was stuttering, clinching her teeth and had  NIHSS 4 with CT brain that showed no acute abnormality. Takes aspirin daily.  Date last known well: 04/28/13  Time last known well: 11 pm  tPA Given: no, exam seems to be functional NIHSS: 4 MRS: 0  Past Medical History  Diagnosis Date  . Hypertension     09/2010-normal CMet and CBC; Lipid profile-116, 88, 25, 73  . Cardiomyopathy 08/2010    Presented with congestive heart failure; EF of 15% and 2012; hypotension on medication precludes optimal dosing  . Gastroesophageal reflux disease   . Pneumonia   . Obesity   . Hyperlipidemia   . Tobacco abuse     20 pack years  . CHF (congestive heart failure)   . Asthma   . Shortness of breath   . Headache(784.0)   . Arthritis   . Anxiety   . Neuropathy   . ICD (implantable cardiac defibrillator) in place  10/10/2011  . Chronic systolic heart failure   . Dysrhythmia   . Automatic implantable cardioverter-defibrillator in situ     2013, july  . COPD (chronic obstructive pulmonary disease)     2013  . Bell palsy     states has had 3 episodes  . Stroke      stroke in 07/2012 and another in June, 2014  . Seizures     last one at age 19  . Neuromuscular disorder     Neuropathy, right foot and leg  . Hypothyroidism     Recent TSH was normal.    Past Surgical History  Procedure Laterality Date  . Cesarean section      X2  . Tee without cardioversion N/A 07/11/2012    Procedure: TRANSESOPHAGEAL ECHOCARDIOGRAM (TEE);  Surgeon:  Thayer Headings, MD;  Location: [place];  Service: Cardiovascular;  Laterality: N/A;  . Tubal ligation    . Multiple extractions with alveoloplasty N/A 09/24/2012    Procedure: MULTIPLE EXTRACION #2, 4, 6, 7 ,8, 9, 11, 13, 18, 20, 21, 22, 23, 24, 25, 26, 27, 29 WITH ALVEOLOPLASTY, BIOPSY OF PALATE LESION, REMOVA RIGHT LINGUAL TORUS;  Surgeon: Gae Bon, DDS;  Location: [place];  Service: Oral Surgery;  Laterality: N/A;    Family History  Problem Relation Age of Onset  . Cardiomyopathy Mother     ICD pacemaker-ischmic CM  . Heart failure Mother   . Hypertension Mother   . Cardiomyopathy Father     Deceased  . Coronary artery disease Father   . Heart failure Father   . Heart failure Brother   . Hypertension Brother    Social History:  reports that she has been smoking Cigarettes.  She has a 5.25 pack-year smoking history. She has never used smokeless tobacco. She reports that she does not drink alcohol or use illicit drugs.  Allergies:  Allergies  Allergen Reactions  . Fish Allergy Anaphylaxis  . Iodinated Diagnostic Agents Hives and Other (See Comments)    Pulmonary problems; no frank respiratory arrest  . Iodine Hives and Other (See Comments)    Pulmonary Problems   . Lentil Anaphylaxis  . Penicillins Anaphylaxis and Shortness Of Breath    Hair  loss  . Lipitor [Atorvastatin] Itching and Rash  . Spironolactone Rash  . Sulfa Antibiotics Other (See Comments)    Unknown    Medications:                                                                                                                           I have reviewed the patient's current medications.  ROS:                                                                                                                                       History obtained from chart review  General ROS: negative for - chills, fatigue, fever, night sweats, or weight loss Psychological ROS: negative for - behavioral disorder, hallucinations, memory difficulties, or suicidal ideation Ophthalmic ROS: negative for - blurry vision, double vision, eye pain or loss of vision ENT ROS: negative for - epistaxis, nasal discharge, oral lesions, sore throat, tinnitus or vertigo Allergy  and Immunology ROS: negative for - hives or itchy/watery eyes Hematological and Lymphatic ROS: negative for - bleeding problems, bruising or swollen lymph nodes Endocrine ROS: negative for - galactorrhea, hair pattern changes, polydipsia/polyuria or temperature intolerance Respiratory ROS: negative for - cough, hemoptysis, shortness of breath or wheezing Cardiovascular ROS: significant for - chest pain, dyspnea on exertion, but no edema or irregular heartbeat Gastrointestinal ROS: negative for - abdominal pain, diarrhea, hematemesis, nausea/vomiting or stool incontinence Genito-Urinary ROS: negative for - dysuria, hematuria, incontinence or urinary frequency/urgency Musculoskeletal ROS: negative for - joint swelling or muscular weakness Neurological ROS: as noted in HPI Dermatological ROS: negative for rash and skin lesion changes  Physical exam: pleasant female in no apparent distress. BP 130/60 P 82 R 17, afebrile Head: normocephalic. Neck: supple, no bruits, no JVD. Cardiac: no murmurs. Lungs: clear. Abdomen: soft, no  tender, no mass. Extremities: no edema.  Neurologic Examination:                                                                                                      Mental Status: Alert, awake, oriented x 4. Comprehension, naming, and repetition intact. Stuttering but no frank aphasia.  Able to follow 3 step commands without difficulty. Cranial Nerves: II: Discs flat bilaterally; Visual fields grossly normal, pupils equal, round, reactive to light and accommodation III,IV, VI: ptosis not present, extra-ocular motions intact bilaterally V,VII: smile asymmetric with ? right face weakness, facial light touch sensation normal bilaterally VIII: hearing normal bilaterally IX,X: gag reflex present XI: bilateral shoulder shrug XII: midline tongue extension without atrophy or fasciculations  Motor: Right : Upper extremity   5/5    Left:     Upper extremity   5/5  Lower extremity   5/5     Lower extremity   5/5 Tone and bulk:normal tone throughout; no atrophy noted Sensory: Pinprick and light touch intact throughout, bilaterally Deep Tendon Reflexes:  Right: Upper Extremity   Left: Upper extremity   biceps (C-5 to C-6) 2/4   biceps (C-5 to C-6) 2/4 tricep (C7) 2/4    triceps (C7) 2/4 Brachioradialis (C6) 2/4  Brachioradialis (C6) 2/4  Lower Extremity Lower Extremity  quadriceps (L-2 to L-4) 2/4   quadriceps (L-2 to L-4) 2/4 Achilles (S1) 2/4   Achilles (S1) 2/4  Plantars: Right: downgoing   Left: downgoing Cerebellar: normal finger-to-nose,  normal heel-to-shin test Gait:  No tested. CV: pulses palpable throughout    No results found for this or any previous visit (from the past 48 hour(s)). No results found.   Assessment: 53 y.o. female with multiple risk factors for stroke, brought in as a code stroke due to acute onset speech impairment, right face droopiness, HA, and chest pain. NIHSS 4 but her exam seems to be functional. CT brain unremarkable. Did not treat with tpa due to  functional exam and low NIHSS. Can not have MRI due to pacemaker. Will get CT brain in 24 hours, CUS, TTE, lipid profile. Continue aspirin.  Stroke Risk Factors -  HTN, hyperlipidemia, obesity, heavy smoking,  MI, chronic congestive heart  failure, stroke/TIA  Dorian Pod, MD [facility] [phone]  04/29/2013, 1:01 AM

## 2013-04-29 NOTE — Progress Notes (Signed)
PT Cancellation Note  Patient Details Name: Melanie Cordova MRN: [id number] DOB: January 04, 1961   Cancelled Treatment:    Reason Eval/Treat Not Completed: Patient at procedure or test/unavailable.     Meganne Rita, Thornton Papas 04/29/2013, 2:03 PM

## 2013-04-29 NOTE — Progress Notes (Signed)
*  PRELIMINARY RESULTS* Vascular Ultrasound Carotid Duplex (Doppler) has been completed.   Findings suggest 1-39% internal carotid artery stenosis bilaterally. Vertebral arteries are patent with antegrade flow.  04/29/2013 9:40 AM Maudry Mayhew, RVT, RDCS, RDMS

## 2013-04-29 NOTE — Progress Notes (Signed)
Nutrition Brief Note  Patient identified on the Malnutrition Screening Tool (MST) Report  Wt Readings from Last 15 Encounters:  04/29/13 287 lb 8 oz (130.409 kg)  11/26/12 273 lb 4 oz (123.945 kg)  11/24/12 279 lb 5.2 oz (126.7 kg)  09/30/12 272 lb (123.378 kg)  09/24/12 258 lb (117.028 kg)  09/24/12 258 lb (117.028 kg)  07/30/12 265 lb (120.203 kg)  07/27/12 273 lb 13 oz (124.2 kg)  07/25/12 274 lb (124.286 kg)  07/10/12 281 lb 1.4 oz (127.5 kg)  07/10/12 281 lb 1.4 oz (127.5 kg)  01/25/12 290 lb (131.543 kg)  01/16/12 290 lb (131.543 kg)  01/16/12 265 lb (120.203 kg)  11/06/11 268 lb (121.564 kg)    Body mass index is 45.02 kg/(m^2). Patient meets criteria for Morbid Obesity based on current BMI. Pt denies recent weight loss. Pt states she is working with her doctor to adjust her thyroid medication to help her lose weight.   Current diet order is Heart Healthy, patient is consuming approximately 100% of meals at this time. Pt has no questions or concerns at this time. Labs and medications reviewed.   No nutrition interventions warranted at this time. If nutrition issues arise, please consult RD.   Pryor Ochoa RD, LDN Inpatient Clinical Dietitian Pager: [phone] After Hours Pager: [phone]

## 2013-04-29 NOTE — ED Notes (Signed)
Pt arrives via EMS. Grandkids called EMS complaining of pt having slurred speech and facial droop.

## 2013-04-29 NOTE — Progress Notes (Signed)
Pt arrived to the unit at 0653,alert and oriented x4,denies any discomfort,pt settled in bed with telemetry monitor on,v/s signs stable,call light at bed side,will continue to monitor. Obasogie-Asidi, Farhiya Rosten Efe

## 2013-04-29 NOTE — Evaluation (Addendum)
Occupational Therapy Evaluation Patient Details Name: Melanie Cordova MRN: [id number] DOB: 1960/03/28 Today's Date: 04/29/2013 Time: 3762-8315 OT Time Calculation (min): 27 min  OT Assessment / Plan / Recommendation History of present illness 53 y.o. s/p 53 y/o female with PMH of HTN, HPL, h/o CVA, CHF s/p ICD, COPD, smoker, anxiety, Bells palsy presented with dysarthria and facial droop.   Clinical Impression   Education provided to pt during session. Feel pt is safe to d/c home, from OT standpoint.    OT Assessment  Patient does not need any further OT services    Follow Up Recommendations  No OT follow up;Supervision - Intermittent    Barriers to Discharge      Equipment Recommendations  Tub/shower seat    Recommendations for Other Services    Frequency       Precautions / Restrictions Restrictions Weight Bearing Restrictions: No   Pertinent Vitals/Pain No pain reported.     ADL  Lower Body Bathing: Supervision/safety Where Assessed - Lower Body Bathing: Supported standing Lower Body Dressing: Supervision/safety Where Assessed - Lower Body Dressing: Unsupported sit to stand Toilet Transfer: Supervision/safety Toilet Transfer Method: Sit to Loss adjuster, chartered: Comfort height toilet;Regular height toilet Toileting - Clothing Manipulation and Hygiene: Supervision/safety Where Assessed - Best boy and Hygiene: Sit to stand from 3-in-1 or toilet Tub/Shower Transfer: Supervision/safety Tub/Shower Transfer Method: Therapist, art: Other (comment) (practiced stepping over) Equipment Used: Gait belt Transfers/Ambulation Related to ADLs: Supervision.  ADL Comments: Educated on energy conservation techniques during session. Recommended sitting for bathing and dressing and standing in front of chair/bed when pulling up LB clothing. Spoke about safe shoe wear and having family pick up rugs (non skid in bathroom) and  clutter if there is any. Educated on signs and symptoms of stroke and importance of getting help right away as well and advised to quit smoking and avoid canned foods. Pt ambulated to gym and back with rest breaks.    OT Diagnosis:    OT Problem List:   OT Treatment Interventions:     OT Goals(Current goals can be found in the care plan section)    Visit Information  Last OT Received On: 04/29/13 Assistance Needed: +1 History of Present Illness: 53 y.o. s/p 53 y/o female with PMH of HTN, HPL, h/o CVA, CHF s/p ICD, COPD, smoker, anxiety, Bells palsy presented with dysarthria and facial droop.       Prior [place] expects to be discharged to:: Private residence Living Arrangements: Alone Available Help at Discharge: Family;Friend(s);Available PRN/intermittently Type of Home: Apartment Home Access: Stairs to enter Entrance Stairs-Number of Steps: 7 Entrance Stairs-Rails: Right;Left Home Layout: One level Home Equipment: Cane - single point Prior Function Level of Independence: Independent with assistive device(s) Comments: used cane sometimes Communication Communication: No difficulties Dominant Hand: Right         Vision/Perception Vision - History Baseline Vision: Wears glasses only for reading Patient Visual Report: No change from baseline (blurry when admitted-now resolved) Vision - Assessment Vision Assessment: Vision tested Tracking/Visual Pursuits: Able to track stimulus in all quads without difficulty Visual Fields: No apparent deficits   Cognition  Cognition Arousal/Alertness: Awake/alert Behavior During Therapy: WFL for tasks assessed/performed Overall Cognitive Status: Within Functional Limits for tasks assessed    Extremity/Trunk Assessment Upper Extremity Assessment Upper Extremity Assessment: Overall WFL for tasks assessed Lower Extremity Assessment Lower Extremity Assessment: Defer to PT evaluation     Mobility  Bed  Mobility Overal bed mobility: Modified Independent Transfers Overall transfer level: Needs assistance Equipment used: None Transfers: Sit to/from Stand Sit to Stand: Supervision     Exercise     Balance     End of Session OT - End of Session Equipment Utilized During Treatment: Gait belt Activity Tolerance: Patient tolerated treatment well Patient left: in bed;with call bell/phone within reach;with bed alarm set  GO Functional Assessment Tool Used: clinical judgment Functional Limitation: Self care Self Care Current Status (G2542): At least 1 percent but less than 20 percent impaired, limited or restricted Self Care Goal Status (H0623): At least 1 percent but less than 20 percent impaired, limited or restricted Self Care Discharge Status [phone]): At least 1 percent but less than 20 percent impaired, limited or restricted   Benito Mccreedy OTR/L [phone] 04/29/2013, 2:05 PM

## 2013-04-29 NOTE — Code Documentation (Signed)
53 yo wf brought in via [facility] for sudden onset chest pain, jaw pain, & facial droop.  Per reports there was an argument in the household & the pt developed CP.  On arrival to [facility] the pt was Alert & Ox4, MAE.  Pt c/o left chest & jaw pain & has jaw clenching.  Pt is dysarthric r/t jaw clenching.  Pt has sensory deficit opposite of her facial droop. NIH 4.  See doc flowsheet for code stroke times.  No acute stroke treatment at this time.

## 2013-04-29 NOTE — Progress Notes (Signed)
  Echocardiogram 2D Echocardiogram has been performed.  Melanie Cordova 04/29/2013, 10:27 AM

## 2013-04-29 NOTE — Progress Notes (Signed)
[facility] PROGRESS NOTE  LEVI CRASS MGQ:[id number] DOB: 29-Apr-1960 DOA: 04/29/2013 PCP: Rosita Fire, MD  Assessment/Plan: 53 y/o female with PMH of HTN, HPL, h/o CVA, CHF s/p ICD, COPD, smoker, anxiety, Bells palsy presented with dysarthria, R facial droop after some type of heated argument or a fight in the house   1. Dysarthria, R facial droop; probable TIA; neuro exam non focal; CT head: no acute findings;  -symptoms resolved; appreciate neurology input, probable CVA work up in progress   2. chest pain resolved; atypical; likely anxiety;  -ECG, trop negative; cont home regimen   3.  Chronic CHF, s/p ICD; echo (06/2012): LVEF 25% -clinically euvolemic; cont BB, ACE, diuretics, ASA  4. COPD no s/s of acute exacerbation; cont bronchodilators, taper steroids, on doxy     Code Status: full Family Communication: d/w aptient (indicate person spoken with, relationship, and if by phone, the number) Disposition Plan: home 24-48 houtrs    Consultants:  Neurology   Procedures:  Pend CT  Antibiotics:  None  (indicate start date, and stop date if known)  HPI/Subjective: alert  Objective: Filed Vitals:   04/29/13 0852  BP: 125/74  Pulse: 78  Temp: 97.7 F (36.5 C)  Resp: 18   No intake or output data in the 24 hours ending 04/29/13 0911 Filed Weights   04/29/13 0103 04/29/13 0655  Weight: 122.471 kg (270 lb) 130.409 kg (287 lb 8 oz)    Exam:   General:  alert  Cardiovascular: s1,s2 rrr  Respiratory: CTA BL  Abdomen: soft, nt, nd   Musculoskeletal: no LE edema   Data Reviewed: Basic Metabolic Panel:  Recent Labs Lab 04/29/13 0044 04/29/13 0049  NA 138 139  K 4.4 4.3  CL 97 97  CO2 27  --   GLUCOSE 103* 106*  BUN 9 7  CREATININE 0.94 1.10  CALCIUM 9.1  --    Liver Function Tests:  Recent Labs Lab 04/29/13 0044  AST 18  ALT 20  ALKPHOS 113  BILITOT 0.3  PROT 7.2  ALBUMIN 3.6   No results found for this basename: LIPASE,  AMYLASE,  in the last 168 hours No results found for this basename: AMMONIA,  in the last 168 hours CBC:  Recent Labs Lab 04/29/13 0044 04/29/13 0049  WBC 13.7*  --   NEUTROABS 8.8*  --   HGB 15.1* 16.3*  HCT 44.4 48.0*  MCV 91.4  --   PLT 294  --    Cardiac Enzymes:  Recent Labs Lab 04/29/13 0044  TROPONINI <0.30   BNP (last 3 results) No results found for this basename: PROBNP,  in the last 8760 hours CBG:  Recent Labs Lab 04/29/13 0108 04/29/13 0901  GLUCAP 99 81    No results found for this or any previous visit (from the past 240 hour(s)).   Studies: Ct Head (brain) Wo Contrast  04/29/2013   CLINICAL DATA:  Stroke  EXAM: CT HEAD WITHOUT CONTRAST  TECHNIQUE: Contiguous axial images were obtained from the base of the skull through the vertex without intravenous contrast.  COMPARISON:  Prior CT from 11/24/2012.  FINDINGS: Few scattered atherosclerotic calcifications noted within the cavernous segments of the internal carotid arteries bilaterally. Cerebral volume is within normal limits for patient age. No significant small vessel disease appreciated.  There is no acute intracranial hemorrhage or infarct. Subcentimeter hypodensity within the left cerebellar hemisphere is unchanged. No mass lesion or midline shift. Gray-white matter differentiation is well maintained. Ventricles are normal in  size without evidence of hydrocephalus. CSF containing spaces are within normal limits. No extra-axial fluid collection.  The calvarium is intact.  Orbital soft tissues are within normal limits.  The paranasal sinuses and mastoid air cells are well pneumatized and free of fluid.  Scalp soft tissues are unremarkable.  IMPRESSION: No acute intracranial process identified.  Critical Value/emergent results were called by telephone at the time of interpretation on 04/29/2013 at 1:12 AM to Dr. Casilda Carls , who verbally acknowledged these results.   Electronically Signed   By: Jeannine Boga M.D.    On: 04/29/2013 01:13    Scheduled Meds: . aspirin  325 mg Oral Daily  . budesonide-formoterol  2 puff Inhalation BID  . doxycycline  100 mg Oral Q12H  . famotidine  20 mg Oral Daily  . [START ON 04/30/2013] ferrous sulfate  325 mg Oral Q breakfast  . furosemide  40 mg Oral BID  . gabapentin  300 mg Oral QHS  . guaiFENesin  600 mg Oral BID  . ipratropium  0.5 mg Nebulization Q6H  . levothyroxine  200 mcg Oral QAC breakfast  . lisinopril  10 mg Oral Daily  . loratadine  10 mg Oral Daily  . pantoprazole  40 mg Oral Q1200  . potassium chloride SA  40 mEq Oral BID  . predniSONE  60 mg Oral BID WC  . simvastatin  20 mg Oral q1800   Continuous Infusions:   Active Problems:   Hypertension   Cardiomyopathy   Tobacco abuse   Automatic implantable cardioverter-defibrillator in situ   TIA (transient ischemic attack)   Asthma    Time spent: >35 minutes     Kinnie Feil  [facility] Pager [phone]. If 7PM-7AM, please contact night-coverage at [email or web address], password [facility] 04/29/2013, 9:11 AM  LOS: 0 days

## 2013-04-29 NOTE — H&P (Signed)
PCP:  FANTA,TESFAYE, MD  [facility]  Chief Complaint:  Chest pain  HPI: Melanie Cordova is a 53 y.o. 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   Presented with  She had argument with her grandchild earlier in the day and started to have chest pain around 11 pm it felt like elephant sitting on her chest started on the right and moved to the left. Chest pressure got better but still there.  Around the same time she felt that her face has drown up on the left and she started to have headache and started to slur her speech this has improved after her arrival to ER. She states that she had a bit trouble walking.  Initially called code stroke but her exam thought to be functional and code stroke was canceled. Patient has AICD in place unable to obtain MRI.   Review of Systems:    Pertinent positives include:  chest pain, cough, wheezing, shortness of breath at rest.  dyspnea on exertion, slurred speech, wheezing.  localizing neurological complaints, gait abnormality,   Constitutional:  No weight loss, night sweats, Fevers, chills, fatigue, weight loss  HEENT:  No headaches, Difficulty swallowing,Tooth/dental problems,Sore throat,  No sneezing, itching, ear ache, nasal congestion, post nasal drip,  Cardio-vascular:  NoOrthopnea, PND, anasarca, dizziness, palpitations.no Bilateral lower extremity swelling  GI:  No heartburn, indigestion, abdominal pain, nausea, vomiting, diarrhea,  change in bowel habits, loss of appetite, melena, blood in stool, hematemesis Resp:  no  No excess mucus, no productive cough, No non-productive cough, No coughing up of blood.No change in color of mucus.No  Skin:  no rash or lesions. No jaundice GU:  no dysuria, change in color of urine, no urgency or frequency. No straining to urinate.  No flank pain.  Musculoskeletal:  No joint pain or no joint swelling. No decreased range of motion. No back pain.  Psych:  No change in mood or affect. No depression or anxiety. No memory loss.  Neuro: no, no tingling, no weakness, no double vision, no no , no confusion  Otherwise ROS are negative except for above, 10 systems were reviewed  Past Medical History: Past Medical History  Diagnosis Date  . Hypertension     09/2010-normal CMet and CBC; Lipid profile-116, 88, 25, 73  . Cardiomyopathy 08/2010    Presented with congestive heart failure; EF of 15% and 2012; hypotension on medication precludes optimal dosing  . Gastroesophageal reflux disease   . Pneumonia   . Obesity   . Hyperlipidemia   . Tobacco abuse     20 pack years  . CHF (congestive heart failure)   . Asthma   . Shortness of breath   . Headache(784.0)   . Arthritis   . Anxiety   . Neuropathy   . 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 Chronic systolic heart failure   . Dysrhythmia   . Automatic implantable cardioverter-defibrillator in situ     2013, july  . COPD (chronic obstructive pulmonary disease)     2013  . Bell palsy     states has had 3 episodes  . Stroke      stroke in  07/2012 and another in June, 2014  . Seizures     last one at age 82  . Neuromuscular disorder     Neuropathy, right foot and leg  . Hypothyroidism     Recent TSH was normal.   Past Surgical History  Procedure Laterality Date  . Cesarean section      X2  . Tee without cardioversion N/A 07/11/2012    Procedure: TRANSESOPHAGEAL ECHOCARDIOGRAM (TEE);  Surgeon: Thayer Headings, MD;  Location: [place];  Service: Cardiovascular;  Laterality: N/A;  . Tubal ligation    . Multiple extractions with alveoloplasty N/A 09/24/2012    Procedure: MULTIPLE EXTRACION #2, 4, 6, 7 ,8, 9, 11, 13, 18, 20, 21, 22, 23, 24, 25, 26, 27, 29 WITH ALVEOLOPLASTY, BIOPSY OF PALATE LESION, REMOVA RIGHT LINGUAL TORUS;  Surgeon: Gae Bon, DDS;  Location: [facility];  Service: Oral Surgery;  Laterality: N/A;     Medications: Prior to Admission medications   Medication Sig Start Date End Date Taking? Authorizing Provider  acetaminophen (TYLENOL) 500 MG tablet Take 1,000 mg by mouth every 6 (six) hours as needed for pain.   Yes Historical Provider, MD  aspirin 325 MG tablet Take 1 tablet (325 mg total) by mouth daily. 07/12/12  Yes Tonia Brooms, MD  budesonide-formoterol [facility]) 80-4.5 MCG/ACT inhaler Inhale 2 puffs into the lungs 2 (two) times daily.   Yes Historical Provider, MD  carvedilol (COREG) 25 MG tablet Take 0.5 tablets (12.5 mg total) by mouth 2 (two) times daily with a meal. 10/30/12  Yes Dot Lanes, MD  desloratadine (CLARINEX) 5 MG tablet Take 5 mg by mouth daily.   Yes Historical Provider, MD  diclofenac sodium (VOLTAREN) 1 % GEL Apply 2 g topically 4 (four) times daily as needed (pain).    Yes Historical Provider, MD  diphenhydrAMINE (BENADRYL) 25 MG tablet Take 25 mg by mouth every 6 (six) hours as needed.   Yes Historical Provider, MD  ferrous sulfate (IRON SUPPLEMENT) 325 (65 FE) MG tablet Take 325 mg by mouth daily with breakfast.   Yes Historical Provider, MD  furosemide (LASIX) 40 MG tablet Take 1 tablet (40 mg total) by mouth 2 (two) times daily. 08/29/12  Yes Yehuda Savannah, MD  gabapentin (NEURONTIN) 300 MG capsule Take 300 mg by mouth at bedtime.  02/14/12  Yes Yehuda Savannah, MD  levothyroxine (SYNTHROID, LEVOTHROID) 200 MCG tablet Take 200 mcg by mouth daily before breakfast.   Yes Historical Provider, MD  lisinopril (PRINIVIL,ZESTRIL) 10 MG tablet  Take 1 tablet (10 mg total) by mouth daily. 08/29/12  Yes Yehuda Savannah, MD  lovastatin (MEVACOR) 40 MG tablet Take 1 tablet (40 mg total) by mouth at bedtime. 08/29/12  Yes Yehuda Savannah, MD  Naproxen Sodium (ALEVE PO) Take 2 tablets by mouth at bedtime as needed (for pain).   Yes Historical Provider, MD  oxymetazoline (AFRIN) 0.05 % nasal spray Place 2 sprays into the nose daily as needed for congestion.   Yes Historical Provider, MD  potassium chloride SA (K-DUR,KLOR-CON) 20 MEQ tablet Take 2 tablets (40 mEq total) by mouth 2 (two) times daily. 08/29/12  Yes Yehuda Savannah, MD  ranitidine (ZANTAC) 150 MG tablet Take 150 mg by mouth 2 (two) times daily.    Yes Historical Provider, MD  tiotropium (SPIRIVA) 18 MCG inhalation capsule Place 18 mcg into inhaler and inhale daily.   Yes Historical Provider, MD  albuterol (PROVENTIL HFA;VENTOLIN HFA) 108 (90 BASE) MCG/ACT  inhaler Inhale 2 puffs into the lungs every 6 (six) hours as needed for wheezing or shortness of breath. 07/12/12   Tonia Brooms, MD    Allergies:   Allergies  Allergen Reactions  . Fish Allergy Anaphylaxis  . Iodinated Diagnostic Agents Hives and Other (See Comments)    Pulmonary problems; no frank respiratory arrest  . Iodine Hives and Other (See Comments)    Pulmonary Problems   . Lentil Anaphylaxis  . Penicillins Anaphylaxis and Shortness Of Breath    Hair loss  . Lipitor [Atorvastatin] Itching and Rash  . Spironolactone Rash  . Sulfa Antibiotics Other (See Comments)    Unknown    Social History:  Ambulatory  Independently  Lives at Home with family   reports that she has been smoking Cigarettes.  She has a 5.25 pack-year smoking history. She has never used smokeless tobacco. She reports that she does not drink alcohol or use illicit drugs.   Family History: family history includes Cardiomyopathy in her father and mother; Coronary artery disease in her father; Heart failure in her brother, father, and  mother; Hypertension in her brother and mother.    Physical Exam: Patient Vitals for the past 24 hrs:  BP Temp Temp src Pulse Resp SpO2 Height Weight  04/29/13 0500 119/78 mmHg - - 73 14 96 % - -  04/29/13 0430 113/77 mmHg - - 75 16 93 % - -  04/29/13 0400 114/82 mmHg - - 75 16 96 % - -  04/29/13 0330 103/62 mmHg - - 75 15 98 % - -  04/29/13 0300 118/72 mmHg - - 77 16 97 % - -  04/29/13 0230 126/69 mmHg - - 79 17 96 % - -  04/29/13 0145 116/70 mmHg - - 86 15 97 % - -  04/29/13 0130 116/94 mmHg - - 80 18 98 % - -  04/29/13 0115 123/57 mmHg - - 79 13 96 % - -  04/29/13 0103 120/67 mmHg 97.9 F (36.6 C) Oral 79 17 97 % 5\' 7"  (1.702 m) 122.471 kg (270 lb)  04/29/13 0100 120/67 mmHg - - 78 20 97 % - -    1. General:  in No Acute distress 2. Psychological: Alert and  Oriented 3. Head/ENT:   Moist   Mucous Membranes                          Head Non traumatic, neck supple                          Normal  Dentition 4. SKIN: normal skin turgor,  Skin clean Dry and intact no rash 5. Heart: Regular rate and rhythm no Murmur, Rub or gallop 6. Lungs :some wheezes bilateral  no crackles   7. Abdomen: Soft, non-tender, Non distended, obese 8. Lower extremities: no clubbing, cyanosis, or edema 9. Neurologically: patient states she has trouble moving her left leg but moving it spontaneously.  facial droop transient, slurred speech resolved after patient is involved in conversation, inconsistent exam 10. MSK: Normal range of motion  body mass index is 42.28 kg/(m^2).   Labs on Admission:   Recent Labs  04/29/13 0044  NA 138  K 4.4  CL 97  CO2 27  GLUCOSE 103*  BUN 9  CREATININE 0.94  CALCIUM 9.1    Recent Labs  04/29/13 0044  AST 18  ALT 20  ALKPHOS 113  BILITOT  0.3  PROT 7.2  ALBUMIN 3.6   No results found for this basename: LIPASE, AMYLASE,  in the last 72 hours  Recent Labs  04/29/13 0044  WBC 13.7*  NEUTROABS 8.8*  HGB 15.1*  HCT 44.4  MCV 91.4  PLT 294     Recent Labs  04/29/13 0044  TROPONINI <0.30   No results found for this basename: TSH, T4TOTAL, FREET3, T3FREE, THYROIDAB,  in the last 72 hours No results found for this basename: VITAMINB12, FOLATE, FERRITIN, TIBC, IRON, RETICCTPCT,  in the last 72 hours Lab Results  Component Value Date   HGBA1C 5.6 07/08/2012    Estimated Creatinine Clearance: 96.1 ml/min (by C-G formula based on Cr of 0.94). ABG    Component Value Date/Time   TCO2 28 11/24/2012 0129     Lab Results  Component Value Date   DDIMER <0.22 10/01/2011     Other results:  I have pearsonaly reviewed this: ECG REPORT  Rate: 73  Rhythm: NSR with PVC ST&T Change: slight t wave depressions <40mm in lateral leads.   Cultures:    Component Value Date/Time   SDES URINE, RANDOM 11/15/2012 2044   SPECREQUEST NONE 11/15/2012 2044   CULT  Value: ESCHERICHIA COLI Performed at [facility] 11/15/2012 2044   REPTSTATUS 11/17/2012 FINAL 11/15/2012 2044       Radiological Exams on Admission: Ct Head (brain) Wo Contrast  04/29/2013   CLINICAL DATA:  Stroke  EXAM: CT HEAD WITHOUT CONTRAST  TECHNIQUE: Contiguous axial images were obtained from the base of the skull through the vertex without intravenous contrast.  COMPARISON:  Prior CT from 11/24/2012.  FINDINGS: Few scattered atherosclerotic calcifications noted within the cavernous segments of the internal carotid arteries bilaterally. Cerebral volume is within normal limits for patient age. No significant small vessel disease appreciated.  There is no acute intracranial hemorrhage or infarct. Subcentimeter hypodensity within the left cerebellar hemisphere is unchanged. No mass lesion or midline shift. Gray-white matter differentiation is well maintained. Ventricles are normal in size without evidence of hydrocephalus. CSF containing spaces are within normal limits. No extra-axial fluid collection.  The calvarium is intact.  Orbital soft tissues are within normal limits.   The paranasal sinuses and mastoid air cells are well pneumatized and free of fluid.  Scalp soft tissues are unremarkable.  IMPRESSION: No acute intracranial process identified.  Critical Value/emergent results were called by telephone at the time of interpretation on 04/29/2013 at 1:12 AM to Dr. Casilda Carls , who verbally acknowledged these results.   Electronically Signed   By: Jeannine Boga M.D.   On: 04/29/2013 01:13    Chart has been reviewed  Assessment/Plan  53 yo F with xh of CHF sp AICD placement , COPD here with TIA vs functional symptoms  Present on Admission:  . TIA (transient ischemic attack) - transient neurologic abnormalities, unable to obtain MRI will repeat CT, neurology is aware, lipid panel, aspirin, 2d echo, carotid dopplers . Cardiomyopathy - appears euvolemic, continue lasix, obtain 2D echo, continue ACEi . Hypertension - continue home medicaitons . Automatic implantable cardioverter-defibrillator in situ - will need to hold off on MRI . Asthma/COPD exacerbation mild but will give steroid, albuterol prn, Atrovent , doxycyclin   Prophylaxis: SCD , Protonix  CODE STATUS: FULL CODE  Other plan as per orders.  I have spent a total of 55 min on this admission  Dewan Emond 04/29/2013, 5:44 AM

## 2013-04-30 LAB — LIPID PANEL
CHOLESTEROL: 240 mg/dL — AB (ref 0–200)
HDL: 53 mg/dL (ref >39)
LDL Cholesterol: 155 mg/dL — ABNORMAL HIGH (ref 0–99)
TRIGLYCERIDES: 158 mg/dL — AB (ref <150)
Total CHOL/HDL Ratio: 4.5 RATIO
VLDL: 32 mg/dL (ref 0–40)

## 2013-04-30 LAB — TROPONIN I: Troponin I: <0.3 ng/mL (ref <0.30)

## 2013-04-30 MED ORDER — ACETAMINOPHEN 325 MG PO TABS: 650.0000 mg | ORAL_TABLET | ORAL | Status: DC | PRN

## 2013-04-30 NOTE — Progress Notes (Signed)
PT Cancellation and Discharge Note  Patient Details Name: Melanie Cordova MRN: [id number] DOB: 10-23-1960   Cancelled Treatment:    Reason Eval/Treat Not Completed: PT screened, no needs identified, will sign off.  Per pt and RN, all symptoms have resolved and no needs for PT at this time.  Will sign off.     Tamarah Bhullar, Thornton Papas 04/30/2013, 2:41 PM

## 2013-04-30 NOTE — Progress Notes (Signed)
Physician Discharge Summary  Melanie Cordova VVO:[id number] DOB: 1960/12/30 DOA: 04/29/2013  PCP: Rosita Fire, MD  Admit date: 04/29/2013 Discharge date: 04/30/2013  Time spent: >35 minutes  Recommendations for Outpatient Follow-up:  F/u with PCP in 1 week F/u with neurologist in 2-3 weeks  Discharge Diagnoses:  Active Problems:   Hypertension   Cardiomyopathy   Tobacco abuse   Automatic implantable cardioverter-defibrillator in situ   TIA (transient ischemic attack)   Asthma   Discharge Condition: stable   Diet recommendation: heart healthy   Filed Weights   04/29/13 0103 04/29/13 0655  Weight: 122.471 kg (270 lb) 130.409 kg (287 lb 8 oz)    History of present illness:  53 y/o female with PMH of HTN, HPL, h/o CVA, CHF s/p ICD, COPD, smoker, anxiety, Bells palsy presented with dysarthria, R facial droop after some type of heated argument or a fight in the house  Hospital Course:   1. Dysarthria, R facial droop; probable TIA vs anxiety after argument at home; patient has h/o bells palsy;  - neuro exam non focal; CT head x 2: no acute findings; carotid US: unremarkable; could not perform MRI due to ICD -symptoms resolved at admission; cont ASA, risk modification; control BP, recommended to f/u with neurologist in 2-3 weeks   2. chest pain resolved; atypical; likely anxiety;  -ECG, trop negative; cont home regimen  3. Chronic CHF, s/p ICD; echo (06/2012): LVEF 25%  -clinically euvolemic; cont BB, ACE, diuretics, ASA  4. COPD no s/s of acute exacerbation; cont bronchodilators   Procedures:  None  (i.e. Studies not automatically included, echos, thoracentesis, etc; not x-rays)  Consultations:  Neurology   Discharge Exam: Filed Vitals:   04/30/13 0600  BP: 130/69  Pulse: 90  Temp: 98.3 F (36.8 C)  Resp: 20    General: alert Cardiovascular: s1,s2 rrr Respiratory: CTA BL  Discharge Instructions  Discharge Orders   Future Appointments Provider Department  Dept Phone   05/09/2013 9:30 AM Evans Lance, MD [place] [phone]   Future Orders Complete By Expires   Diet - low sodium heart healthy  As directed    Discharge instructions  As directed    Comments:     Please follow up with primary care doctor in 1 week   Increase activity slowly  As directed        Medication List         acetaminophen 325 MG tablet  Commonly known as:  TYLENOL  Take 2 tablets (650 mg total) by mouth every 4 (four) hours as needed for mild pain (temperature >/= 99.5 F).     albuterol 108 (90 BASE) MCG/ACT inhaler  Commonly known as:  PROVENTIL HFA;VENTOLIN HFA  Inhale 2 puffs into the lungs every 6 (six) hours as needed for wheezing or shortness of breath.     ALEVE PO  Take 2 tablets by mouth at bedtime as needed (for pain).     aspirin 325 MG tablet  Take 1 tablet (325 mg total) by mouth daily.     budesonide-formoterol 80-4.5 MCG/ACT inhaler  Commonly known as:  SYMBICORT  Inhale 2 puffs into the lungs 2 (two) times daily.     carvedilol 25 MG tablet  Commonly known as:  COREG  Take 0.5 tablets (12.5 mg total) by mouth 2 (two) times daily with a meal.     desloratadine 5 MG tablet  Commonly known as:  CLARINEX  Take 5 mg by mouth daily.  diclofenac sodium 1 % Gel  Commonly known as:  VOLTAREN  Apply 2 g topically 4 (four) times daily as needed (pain).     diphenhydrAMINE 25 MG tablet  Commonly known as:  BENADRYL  Take 25 mg by mouth every 6 (six) hours as needed.     furosemide 40 MG tablet  Commonly known as:  LASIX  Take 1 tablet (40 mg total) by mouth 2 (two) times daily.     gabapentin 300 MG capsule  Commonly known as:  NEURONTIN  Take 300 mg by mouth at bedtime.     IRON SUPPLEMENT 325 (65 FE) MG tablet  Generic drug:  ferrous sulfate  Take 325 mg by mouth daily with breakfast.     levothyroxine 200 MCG tablet  Commonly known as:  SYNTHROID, LEVOTHROID  Take 200 mcg by mouth daily before breakfast.      lisinopril 10 MG tablet  Commonly known as:  PRINIVIL,ZESTRIL  Take 1 tablet (10 mg total) by mouth daily.     lovastatin 40 MG tablet  Commonly known as:  MEVACOR  Take 1 tablet (40 mg total) by mouth at bedtime.     oxymetazoline 0.05 % nasal spray  Commonly known as:  AFRIN  Place 2 sprays into the nose daily as needed for congestion.     potassium chloride SA 20 MEQ tablet  Commonly known as:  K-DUR,KLOR-CON  Take 2 tablets (40 mEq total) by mouth 2 (two) times daily.     ranitidine 150 MG tablet  Commonly known as:  ZANTAC  Take 150 mg by mouth 2 (two) times daily.     tiotropium 18 MCG inhalation capsule  Commonly known as:  SPIRIVA  Place 18 mcg into inhaler and inhale daily.       Allergies  Allergen Reactions  . Fish Allergy Anaphylaxis  . Iodinated Diagnostic Agents Hives and Other (See Comments)    Pulmonary problems; no frank respiratory arrest  . Iodine Hives and Other (See Comments)    Pulmonary Problems   . Lentil Anaphylaxis  . Penicillins Anaphylaxis and Shortness Of Breath    Hair loss  . Lipitor [Atorvastatin] Itching and Rash  . Spironolactone Rash  . Sulfa Antibiotics Other (See Comments)    Unknown       Follow-up Information   Follow up with FANTA,TESFAYE, MD In 1 week.   Specialty:  Internal Medicine   Contact information:   [place] [address] [phone]       Follow up with Forbes Cellar, MD In 3 weeks.   Specialties:  Neurology, Radiology   Contact information:   [address] [place]  [address] [phone]        The results of significant diagnostics from this hospitalization (including imaging, microbiology, ancillary and laboratory) are listed below for reference.    Significant Diagnostic Studies: Dg Chest 2 View  04/29/2013   CLINICAL DATA:  Cough and wheezing  EXAM: CHEST  2 VIEW  COMPARISON:  02/13/2013  FINDINGS: Left subclavian AICD noted. Stable borderline  heart size without CHF or pneumonia. No collapse or consolidation. Negative for effusion or pneumothorax. Trachea midline.  IMPRESSION: Stable chest exam.  No superimposed acute process   Electronically Signed   By: Daryll Brod M.D.   On: 04/29/2013 09:18   Ct Head Without Contrast  04/29/2013   CLINICAL DATA:  Slurred speech, stroke.  EXAM: CT HEAD WITHOUT CONTRAST  TECHNIQUE: Contiguous axial images were obtained  from the base of the skull through the vertex without intravenous contrast.  COMPARISON:  CT 04/29/2013  FINDINGS: Ventricle size is normal and unchanged. Negative for acute infarct, hemorrhage, or mass lesion. Brain appears normal. Skull appears normal.  IMPRESSION: Negative   Electronically Signed   By: Franchot Gallo M.D.   On: 04/29/2013 14:41   Ct Head (brain) Wo Contrast  04/29/2013   CLINICAL DATA:  Stroke  EXAM: CT HEAD WITHOUT CONTRAST  TECHNIQUE: Contiguous axial images were obtained from the base of the skull through the vertex without intravenous contrast.  COMPARISON:  Prior CT from 11/24/2012.  FINDINGS: Few scattered atherosclerotic calcifications noted within the cavernous segments of the internal carotid arteries bilaterally. Cerebral volume is within normal limits for patient age. No significant small vessel disease appreciated.  There is no acute intracranial hemorrhage or infarct. Subcentimeter hypodensity within the left cerebellar hemisphere is unchanged. No mass lesion or midline shift. Gray-white matter differentiation is well maintained. Ventricles are normal in size without evidence of hydrocephalus. CSF containing spaces are within normal limits. No extra-axial fluid collection.  The calvarium is intact.  Orbital soft tissues are within normal limits.  The paranasal sinuses and mastoid air cells are well pneumatized and free of fluid.  Scalp soft tissues are unremarkable.  IMPRESSION: No acute intracranial process identified.  Critical Value/emergent results were called  by telephone at the time of interpretation on 04/29/2013 at 1:12 AM to Dr. Casilda Carls , who verbally acknowledged these results.   Electronically Signed   By: Jeannine Boga M.D.   On: 04/29/2013 01:13    Microbiology: No results found for this or any previous visit (from the past 240 hour(s)).   Labs: Basic Metabolic Panel:  Recent Labs Lab 04/29/13 0044 04/29/13 0049  NA 138 139  K 4.4 4.3  CL 97 97  CO2 27  --   GLUCOSE 103* 106*  BUN 9 7  CREATININE 0.94 1.10  CALCIUM 9.1  --    Liver Function Tests:  Recent Labs Lab 04/29/13 0044  AST 18  ALT 20  ALKPHOS 113  BILITOT 0.3  PROT 7.2  ALBUMIN 3.6   No results found for this basename: LIPASE, AMYLASE,  in the last 168 hours No results found for this basename: AMMONIA,  in the last 168 hours CBC:  Recent Labs Lab 04/29/13 0044 04/29/13 0049  WBC 13.7*  --   NEUTROABS 8.8*  --   HGB 15.1* 16.3*  HCT 44.4 48.0*  MCV 91.4  --   PLT 294  --    Cardiac Enzymes:  Recent Labs Lab 04/29/13 0044 04/29/13 1253 04/29/13 1853 04/30/13 0038 04/30/13 0639  TROPONINI <0.30 <0.30 <0.30 <0.30 <0.30   BNP: BNP (last 3 results) No results found for this basename: PROBNP,  in the last 8760 hours CBG:  Recent Labs Lab 04/29/13 0108 04/29/13 0901 04/29/13 1120  GLUCAP 99 81 100*       Signed:  Cristen Bredeson N  [facility] 04/30/2013, 8:58 AM

## 2013-04-30 NOTE — Discharge Summary (Signed)
53 y.o. female with past medical history of polysubstance abuse, chronic systolic heart failure, hepatitis C, atrial fibrillation, seizures who was recently in the hospital for A. fib with RVR and pneumonia was eventually transferred to behavioral health for depression and suicidal ideation who was admitted to [facility] ED 04/25/2013 with left sided weakness and left side facial droop. This physical findings were still present in ED at the time of the admission. Pt also had complaints of chest pain but his cardiac markers were WNL. His 12 lead EKG showed atrial flutter rate controlled and LVH. CT angiogram of the chest ruled out dissection; there was a suspicion of left lower lobe bronchopneumonia. CT head was negative for any acute intracranial findings. Pt was evaluated by psych for suicidal ideations; patient is still having suicidal ideations; may need inpatient psychiatry admission; re consulted psychiatry  Principal Problem:  1. Left-sided weakness/numbness probable TIA;  -his symptoms improved. MRI was negative for acute findings and carotid Doppler no significant stenosis  - per neuro we chagned aspirin to plavix; appreciate neurology following, not a candidate for anticoagulation per neurology, cardiology; non compliance  2. Atrial fibrillation; cardio changed Cardizem to coreg  - aspirin added to Plavix per neuro/cardio as above; not on AC as above; non compliance, substance abuse, h/o upper GI bleed on coumadin; H/o s/p subtotal gastrectomy with gastrojejunostom  3. Chest pain Troponins negative x3 chest pain resolved  4. Hypertension continue coreg, lasix and hydralazine  5. Acute on chronic diastolic CHF - echo: EF 67% slightly worse since the one in 2014  - cont current regimen; He is not a candidate for an ICD 2/2 ongoing polysubstance abuse.  6. Left lower lobe bronchopneumonia evident on CT scan  - pt on Levaquin since 2/12, improving no fevers; d/c atx 2/17  7. Polysubstance abuse alcohol and  cocaine  -Recommended to stop using drugs  8. History of seizures Continue Dilantin and Trileptal  9. Bipolar disorder, Suicidal ideations  - patient still depressed, crying; still having suicidal ideations; ? Need [facility] admission; reconsulted psychiatry

## 2013-04-30 NOTE — Progress Notes (Signed)
Discharge orders received, pt for discharge home today,  IV D/C,  D/C instructions and Rx given with verbalized understanding.  Family at bedside to assist pt with discharge. Staff brought pt downstairs via wheelchair.  

## 2013-04-30 NOTE — Progress Notes (Addendum)
Subjective: Patient at baseline.  No further focal neurological complaints.    Objective: Current vital signs: BP 133/76  Pulse 90  Temp(Src) 98.1 F (36.7 C) (Oral)  Resp 20  Ht 5\' 7"  (1.702 m)  Wt 130.409 kg (287 lb 8 oz)  BMI 45.02 kg/m2  SpO2 96% Vital signs in last 24 hours: Temp:  [97.2 F (36.2 C)-98.7 F (37.1 C)] 98.1 F (36.7 C) (02/17 0956) Pulse Rate:  [51-106] 90 (02/17 0956) Resp:  [18-22] 20 (02/17 0956) BP: (110-149)/(69-92) 133/76 mmHg (02/17 0956) SpO2:  [95 %-98 %] 96 % (02/17 0956)  Intake/Output from previous day: 02/16 0701 - 02/17 0700 In: 1140 [P.O.:1140] Out: 650 [Urine:650] Intake/Output this shift:   Nutritional status: Cardiac  Neurologic Exam: Mental Status: Alert, oriented, thought content appropriate.  Speech fluent without evidence of aphasia.  Able to follow 3 step commands without difficulty. Cranial Nerves: II: Discs flat bilaterally; Visual fields grossly normal, pupils equal, round, reactive to light and accommodation III,IV, VI: ptosis not present, extra-ocular motions intact bilaterally V,VII: smile symmetric, facial light touch sensation normal bilaterally VIII: hearing normal bilaterally IX,X: gag reflex present XI: bilateral shoulder shrug XII: midline tongue extension Motor: Right : Upper extremity   5/5    Left:     Upper extremity   5/5  Lower extremity   5/5     Lower extremity   5/5 Tone and bulk:normal tone throughout; no atrophy noted Sensory: Pinprick and light touch intact throughout, bilaterally Deep Tendon Reflexes: 2+ and symmetric throughout Plantars: Right: downgoing   Left: downgoing Cerebellar: normal finger-to-nose, normal rapid alternating movements and normal heel-to-shin test  Lab Results: Basic Metabolic Panel:  Recent Labs Lab 04/29/13 0044 04/29/13 0049  NA 138 139  K 4.4 4.3  CL 97 97  CO2 27  --   GLUCOSE 103* 106*  BUN 9 7  CREATININE 0.94 1.10  CALCIUM 9.1  --     Liver Function  Tests:  Recent Labs Lab 04/29/13 0044  AST 18  ALT 20  ALKPHOS 113  BILITOT 0.3  PROT 7.2  ALBUMIN 3.6   No results found for this basename: LIPASE, AMYLASE,  in the last 168 hours No results found for this basename: AMMONIA,  in the last 168 hours  CBC:  Recent Labs Lab 04/29/13 0044 04/29/13 0049  WBC 13.7*  --   NEUTROABS 8.8*  --   HGB 15.1* 16.3*  HCT 44.4 48.0*  MCV 91.4  --   PLT 294  --     Cardiac Enzymes:  Recent Labs Lab 04/29/13 0044 04/29/13 1253 04/29/13 1853 04/30/13 0038 04/30/13 0639  TROPONINI <0.30 <0.30 <0.30 <0.30 <0.30    Lipid Panel:  Recent Labs Lab 04/30/13 0038  CHOL 240*  TRIG 158*  HDL 53  CHOLHDL 4.5  VLDL 32  LDLCALC 155*    CBG:  Recent Labs Lab 04/29/13 0108 04/29/13 0901 04/29/13 1120  GLUCAP 99 81 100*    Microbiology: Results for orders placed during the hospital encounter of 11/15/12  URINE CULTURE     Status: None   Collection Time    11/15/12  8:44 PM      Result Value Ref Range Status   Specimen Description URINE, RANDOM   Final   Special Requests NONE   Final   Culture  Setup Time     Final   Value: 11/15/2012 22:14     Performed at [place]  Final   Value: >=100,000 COLONIES/ML     Performed at [facility]   Culture     Final   Value: ESCHERICHIA COLI     Performed at [facility]   Report Status 11/17/2012 FINAL   Final   Organism ID, Bacteria ESCHERICHIA COLI   Final    Coagulation Studies:  Recent Labs  04/29/13 0044  LABPROT 12.3  INR 0.93    Imaging: Dg Chest 2 View  04/29/2013   CLINICAL DATA:  Cough and wheezing  EXAM: CHEST  2 VIEW  COMPARISON:  02/13/2013  FINDINGS: Left subclavian AICD noted. Stable borderline heart size without CHF or pneumonia. No collapse or consolidation. Negative for effusion or pneumothorax. Trachea midline.  IMPRESSION: Stable chest exam.  No superimposed acute process   Electronically Signed   By:  Daryll Brod M.D.   On: 04/29/2013 09:18   Ct Head Without Contrast  04/29/2013   CLINICAL DATA:  Slurred speech, stroke.  EXAM: CT HEAD WITHOUT CONTRAST  TECHNIQUE: Contiguous axial images were obtained from the base of the skull through the vertex without intravenous contrast.  COMPARISON:  CT 04/29/2013  FINDINGS: Ventricle size is normal and unchanged. Negative for acute infarct, hemorrhage, or mass lesion. Brain appears normal. Skull appears normal.  IMPRESSION: Negative   Electronically Signed   By: Franchot Gallo M.D.   On: 04/29/2013 14:41   Ct Head (brain) Wo Contrast  04/29/2013   CLINICAL DATA:  Stroke  EXAM: CT HEAD WITHOUT CONTRAST  TECHNIQUE: Contiguous axial images were obtained from the base of the skull through the vertex without intravenous contrast.  COMPARISON:  Prior CT from 11/24/2012.  FINDINGS: Few scattered atherosclerotic calcifications noted within the cavernous segments of the internal carotid arteries bilaterally. Cerebral volume is within normal limits for patient age. No significant small vessel disease appreciated.  There is no acute intracranial hemorrhage or infarct. Subcentimeter hypodensity within the left cerebellar hemisphere is unchanged. No mass lesion or midline shift. Gray-white matter differentiation is well maintained. Ventricles are normal in size without evidence of hydrocephalus. CSF containing spaces are within normal limits. No extra-axial fluid collection.  The calvarium is intact.  Orbital soft tissues are within normal limits.  The paranasal sinuses and mastoid air cells are well pneumatized and free of fluid.  Scalp soft tissues are unremarkable.  IMPRESSION: No acute intracranial process identified.  Critical Value/emergent results were called by telephone at the time of interpretation on 04/29/2013 at 1:12 AM to Dr. Casilda Carls , who verbally acknowledged these results.   Electronically Signed   By: Jeannine Boga M.D.   On: 04/29/2013 01:13     Medications:  I have reviewed the patient's current medications. Scheduled: . aspirin  325 mg Oral Daily  . budesonide-formoterol  2 puff Inhalation BID  . doxycycline  100 mg Oral Q12H  . famotidine  20 mg Oral Daily  . ferrous sulfate  325 mg Oral Q breakfast  . furosemide  40 mg Oral BID  . gabapentin  300 mg Oral QHS  . guaiFENesin  600 mg Oral BID  . ipratropium-albuterol  3 mL Nebulization TID  . levothyroxine  200 mcg Oral QAC breakfast  . lisinopril  10 mg Oral Daily  . loratadine  10 mg Oral Daily  . pantoprazole  40 mg Oral Q1200  . potassium chloride SA  40 mEq Oral BID  . predniSONE  20 mg Oral Q breakfast  . simvastatin  20 mg Oral q1800  Assessment/Plan: Patient at baseline.  Work up includes a A1c of 5.7, carotid doppler that shows no hemodynamically significant stenosis, and echocardiogram that shows an EF of 30-35% with a dilated left ventricle.  LDL 155 on Simvastatin.  Repeat head CT showed no acute changes.  Patient unable to have MRI. With presentation suspicion for TIA is low despite risk factors.    Recommendations: 1.  Further management of cholesterol 2.  Continue ASA 3.  No further neurologic intervention is recommended at this time.  If further questions arise, please call or page at that time.  Thank you for allowing neurology to participate in the care of this patient.    LOS: 1 day   Alexis Goodell, MD [facility] [phone] 04/30/2013  12:37 PM

## 2013-05-03 LAB — GLUCOSE, CAPILLARY: GLUCOSE-CAPILLARY: 153 mg/dL — AB (ref 70–99)

## 2013-05-08 ENCOUNTER — Emergency Department (HOSPITAL_COMMUNITY)
Admission: EM | Admit: 2013-05-08 | Discharge: 2013-05-09 | Disposition: A | Discharge status: Home / Self Care | Payer: Medicaid Other | Attending: Emergency Medicine | Admitting: Emergency Medicine

## 2013-05-08 ENCOUNTER — Emergency Department (HOSPITAL_COMMUNITY): Payer: Medicaid Other

## 2013-05-08 ENCOUNTER — Other Ambulatory Visit: Payer: Self-pay

## 2013-05-08 ENCOUNTER — Encounter (HOSPITAL_COMMUNITY): Payer: Self-pay | Admitting: Emergency Medicine

## 2013-05-08 DIAGNOSIS — R42 Dizziness and giddiness: Secondary | ICD-10-CM | POA: Insufficient documentation

## 2013-05-08 DIAGNOSIS — R5383 Other fatigue: Secondary | ICD-10-CM

## 2013-05-08 DIAGNOSIS — Z8701 Personal history of pneumonia (recurrent): Secondary | ICD-10-CM | POA: Insufficient documentation

## 2013-05-08 DIAGNOSIS — R5381 Other malaise: Secondary | ICD-10-CM | POA: Insufficient documentation

## 2013-05-08 DIAGNOSIS — Z95810 Presence of automatic (implantable) cardiac defibrillator: Secondary | ICD-10-CM | POA: Insufficient documentation

## 2013-05-08 DIAGNOSIS — I10 Essential (primary) hypertension: Secondary | ICD-10-CM | POA: Insufficient documentation

## 2013-05-08 DIAGNOSIS — F17200 Nicotine dependence, unspecified, uncomplicated: Secondary | ICD-10-CM | POA: Insufficient documentation

## 2013-05-08 DIAGNOSIS — Z880 Allergy status to penicillin: Secondary | ICD-10-CM | POA: Insufficient documentation

## 2013-05-08 DIAGNOSIS — Z8673 Personal history of transient ischemic attack (TIA), and cerebral infarction without residual deficits: Secondary | ICD-10-CM | POA: Insufficient documentation

## 2013-05-08 DIAGNOSIS — J449 Chronic obstructive pulmonary disease, unspecified: Secondary | ICD-10-CM | POA: Insufficient documentation

## 2013-05-08 DIAGNOSIS — M129 Arthropathy, unspecified: Secondary | ICD-10-CM | POA: Insufficient documentation

## 2013-05-08 DIAGNOSIS — R51 Headache: Secondary | ICD-10-CM | POA: Insufficient documentation

## 2013-05-08 DIAGNOSIS — Z7982 Long term (current) use of aspirin: Secondary | ICD-10-CM | POA: Insufficient documentation

## 2013-05-08 DIAGNOSIS — E039 Hypothyroidism, unspecified: Secondary | ICD-10-CM | POA: Insufficient documentation

## 2013-05-08 DIAGNOSIS — IMO0002 Reserved for concepts with insufficient information to code with codable children: Secondary | ICD-10-CM | POA: Insufficient documentation

## 2013-05-08 DIAGNOSIS — I5022 Chronic systolic (congestive) heart failure: Secondary | ICD-10-CM | POA: Insufficient documentation

## 2013-05-08 DIAGNOSIS — R209 Unspecified disturbances of skin sensation: Secondary | ICD-10-CM | POA: Insufficient documentation

## 2013-05-08 DIAGNOSIS — R252 Cramp and spasm: Secondary | ICD-10-CM | POA: Insufficient documentation

## 2013-05-08 DIAGNOSIS — R519 Headache, unspecified: Secondary | ICD-10-CM

## 2013-05-08 DIAGNOSIS — Z79899 Other long term (current) drug therapy: Secondary | ICD-10-CM | POA: Insufficient documentation

## 2013-05-08 DIAGNOSIS — R4789 Other speech disturbances: Secondary | ICD-10-CM | POA: Insufficient documentation

## 2013-05-08 DIAGNOSIS — F449 Dissociative and conversion disorder, unspecified: Secondary | ICD-10-CM | POA: Insufficient documentation

## 2013-05-08 DIAGNOSIS — G709 Myoneural disorder, unspecified: Secondary | ICD-10-CM | POA: Insufficient documentation

## 2013-05-08 DIAGNOSIS — K219 Gastro-esophageal reflux disease without esophagitis: Secondary | ICD-10-CM | POA: Insufficient documentation

## 2013-05-08 DIAGNOSIS — E785 Hyperlipidemia, unspecified: Secondary | ICD-10-CM | POA: Insufficient documentation

## 2013-05-08 DIAGNOSIS — E669 Obesity, unspecified: Secondary | ICD-10-CM | POA: Insufficient documentation

## 2013-05-08 DIAGNOSIS — J4489 Other specified chronic obstructive pulmonary disease: Secondary | ICD-10-CM | POA: Insufficient documentation

## 2013-05-08 LAB — CBG MONITORING, ED: GLUCOSE-CAPILLARY: 115 mg/dL — AB (ref 70–99)

## 2013-05-08 MED ORDER — METOCLOPRAMIDE HCL 5 MG/ML IJ SOLN
10.0000 mg | Freq: Once | INTRAMUSCULAR | Status: AC
  Administered 2013-05-08: 10 mg via INTRAVENOUS
  Filled 2013-05-08: qty 2

## 2013-05-08 MED ORDER — MORPHINE SULFATE 4 MG/ML IJ SOLN
4.0000 mg | INTRAMUSCULAR | Status: DC | PRN
  Administered 2013-05-08: 4 mg via INTRAVENOUS
  Filled 2013-05-08: qty 1

## 2013-05-08 NOTE — ED Provider Notes (Signed)
CSN: [id number]     Arrival date & time 05/08/13  2217 History   First MD Initiated Contact with Patient 05/08/13 2234     Chief Complaint  Patient presents with  . Stroke Symptoms     HPI  Patient presents via ambulance. She states she was sitting at home when she developed a headache in the back of her head throbbing and intermittent. Not a sudden thunderclap over 5 tenderness or headache progressed. She states that the left side of her mouth down she could not move it. She states her right eye closed she cannot open. She states her right toes became cramps had difficulty using them. She is noted by paramedics to be on ambulate to the stretcher without any difficulty and had a normal-appearing face when she spoke immediately with activities "spasms". She's had multiple similar presentations to to the [facility] system and multiple alerts of "code stroke".  He has had normal imaging including MRI MRA and 2012.  Past Medical History  Diagnosis Date  . Hypertension     09/2010-normal CMet and CBC; Lipid profile-116, 88, 25, 73  . Cardiomyopathy 08/2010    Presented with congestive heart failure; EF of 15% and 2012; hypotension on medication precludes optimal dosing  . Gastroesophageal reflux disease   . Pneumonia   . Obesity   . Hyperlipidemia   . Tobacco abuse     20 pack years  . CHF (congestive heart failure)   . Asthma   . Shortness of breath   . Headache(784.0)   . Arthritis   . Anxiety   . Neuropathy   . 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 Chronic systolic heart failure   . Dysrhythmia   . Automatic implantable cardioverter-defibrillator in situ     2013, july  . COPD (chronic obstructive pulmonary disease)     2013  . Bell palsy     states has had 3 episodes  . Stroke      stroke in 07/2012 and another in June, 2014  . Seizures     last one at age 89  . Neuromuscular disorder     Neuropathy, right foot and leg  . Hypothyroidism     Recent TSH was  normal.   Past Surgical History  Procedure Laterality Date  . Cesarean section      X2  . Tee without cardioversion N/A 07/11/2012    Procedure: TRANSESOPHAGEAL ECHOCARDIOGRAM (TEE);  Surgeon: Vesta Mixer, MD;  Location: [facility] ENDOSCOPY;  Service: Cardiovascular;  Laterality: N/A;  . Tubal ligation    . Multiple extractions with alveoloplasty N/A 09/24/2012    Procedure: MULTIPLE EXTRACION #2, 4, 6, 7 ,8, 9, 11, 13, 18, 20, 21, 22, 23, 24, 25, 26, 27, 29 WITH ALVEOLOPLASTY, BIOPSY OF PALATE LESION, REMOVA RIGHT LINGUAL TORUS;  Surgeon: Georgia Lopes, DDS;  Location: [facility] OR;  Service: Oral Surgery;  Laterality: N/A;   Family History  Problem Relation Age of Onset  . Cardiomyopathy Mother     ICD pacemaker-ischmic CM  . Heart failure Mother   . Hypertension Mother   . Cardiomyopathy Father     Deceased  . Coronary artery disease Father   . Heart failure Father   . Heart failure Brother   . Hypertension Brother    History  Substance Use Topics  . Smoking status: Current Every Day Smoker -- 0.25 packs/day for 21 years    Types: Cigarettes  . Smokeless tobacco: Never Used  Comment: 5 ciggs daily 11-26-12  . Alcohol Use: No   OB History   Grav Para Term Preterm Abortions TAB SAB Ect Mult Living                 Review of Systems  Constitutional: Negative for fever, chills, diaphoresis, appetite change and fatigue.  HENT: Negative for mouth sores, sore throat and trouble swallowing.   Eyes: Negative for visual disturbance.  Respiratory: Negative for cough, chest tightness, shortness of breath and wheezing.   Cardiovascular: Negative for chest pain.  Gastrointestinal: Negative for nausea, vomiting, abdominal pain, diarrhea and abdominal distention.  Endocrine: Negative for polydipsia, polyphagia and polyuria.  Genitourinary: Negative for dysuria, frequency and hematuria.  Musculoskeletal: Negative for gait problem.  Skin: Negative for color change, pallor and rash.   Neurological: Positive for dizziness, speech difficulty, weakness, numbness and headaches. Negative for syncope and light-headedness.  Hematological: Does not bruise/bleed easily.  Psychiatric/Behavioral: Negative for behavioral problems and confusion.      Allergies  Fish allergy; Iodinated diagnostic agents; Iodine; Lentil; Penicillins; Lipitor; Spironolactone; and Sulfa antibiotics  Home Medications   Current Outpatient Rx  Name  Route  Sig  Dispense  Refill  . albuterol (PROVENTIL HFA;VENTOLIN HFA) 108 (90 BASE) MCG/ACT inhaler   Inhalation   Inhale 2 puffs into the lungs every 6 (six) hours as needed for wheezing or shortness of breath.   1 Inhaler   5   . aspirin 325 MG tablet   Oral   Take 1 tablet (325 mg total) by mouth daily.   30 tablet   5   . budesonide-formoterol (SYMBICORT) 80-4.5 MCG/ACT inhaler   Inhalation   Inhale 2 puffs into the lungs 2 (two) times daily.         . carvedilol (COREG) 25 MG tablet   Oral   Take 0.5 tablets (12.5 mg total) by mouth 2 (two) times daily with a meal.   180 tablet   0   . desloratadine (CLARINEX) 5 MG tablet   Oral   Take 5 mg by mouth daily.         . diclofenac sodium (VOLTAREN) 1 % GEL   Topical   Apply 2 g topically 4 (four) times daily as needed (pain).          Marland Kitchen diphenhydrAMINE (BENADRYL) 25 MG tablet   Oral   Take 25 mg by mouth every 6 (six) hours as needed.         . ferrous sulfate (IRON SUPPLEMENT) 325 (65 FE) MG tablet   Oral   Take 325 mg by mouth daily with breakfast.         . furosemide (LASIX) 40 MG tablet   Oral   Take 1 tablet (40 mg total) by mouth 2 (two) times daily.   180 tablet   0   . gabapentin (NEURONTIN) 300 MG capsule   Oral   Take 300 mg by mouth at bedtime.          Marland Kitchen levothyroxine (SYNTHROID, LEVOTHROID) 200 MCG tablet   Oral   Take 200 mcg by mouth daily before breakfast.         . lisinopril (PRINIVIL,ZESTRIL) 10 MG tablet   Oral   Take 1 tablet (10  mg total) by mouth daily.   90 tablet   0   . lovastatin (MEVACOR) 40 MG tablet   Oral   Take 1 tablet (40 mg total) by mouth at bedtime.   90 tablet  0   . Naproxen Sodium (ALEVE PO)   Oral   Take 2 tablets by mouth at bedtime as needed (for pain).         Marland Kitchen oxymetazoline (AFRIN) 0.05 % nasal spray   Nasal   Place 2 sprays into the nose daily as needed for congestion.         . potassium chloride SA (K-DUR,KLOR-CON) 20 MEQ tablet   Oral   Take 2 tablets (40 mEq total) by mouth 2 (two) times daily.   180 tablet   0   . ranitidine (ZANTAC) 150 MG tablet   Oral   Take 150 mg by mouth 2 (two) times daily.          Marland Kitchen tiotropium (SPIRIVA) 18 MCG inhalation capsule   Inhalation   Place 18 mcg into inhaler and inhale daily.         Marland Kitchen acetaminophen (TYLENOL) 325 MG tablet   Oral   Take 2 tablets (650 mg total) by mouth every 4 (four) hours as needed for mild pain (temperature >/= 99.5 F).          BP 105/59  Pulse 84  Temp(Src) 97.9 F (36.6 C) (Oral)  Resp 21  Ht 5\' 7"  (1.702 m)  Wt 279 lb (126.554 kg)  BMI 43.69 kg/m2  SpO2 94% Physical Exam  Constitutional: She is oriented to person, place, and time. She appears well-developed and well-nourished. No distress.  HENT:  Head: Normocephalic.  Eyes: Conjunctivae are normal. Pupils are equal, round, and reactive to light. No scleral icterus.  Neck: Normal range of motion. Neck supple. No thyromegaly present.  Cardiovascular: Normal rate and regular rhythm.  Exam reveals no gallop and no friction rub.   No murmur heard. Pulmonary/Chest: Effort normal and breath sounds normal. No respiratory distress. She has no wheezes. She has no rales.  Abdominal: Soft. Bowel sounds are normal. She exhibits no distension. There is no tenderness. There is no rebound.  Musculoskeletal: Normal range of motion.  Neurological: She is alert and oriented to person, place, and time.  She is anxious. She has what appears to be a  purposeful downward drawing of the left side of her mouth.  She hit her right eye tightly closed, a low but when asked to.  He has normal use of strength of her 4 extremities. She is mentating normally she denies visual changes  Skin: Skin is warm and dry. No rash noted.  Psychiatric: She has a normal mood and affect. Her behavior is normal.    ED Course  Procedures (including critical care time) Labs Review Labs Reviewed  CBG MONITORING, ED - Abnormal; Notable for the following:    Glucose-Capillary 115 (*)    All other components within normal limits   Imaging Review Ct Head Wo Contrast  05/08/2013   CLINICAL DATA:  Headache and vertigo. Left-sided numbness and sensation of left facial drooping.  EXAM: CT HEAD WITHOUT CONTRAST  TECHNIQUE: Contiguous axial images were obtained from the base of the skull through the vertex without intravenous contrast.  COMPARISON:  CT of the head performed 04/29/2013  FINDINGS: There is no evidence of acute infarction, mass lesion, or intra- or extra-axial hemorrhage on CT.  The posterior fossa, including the cerebellum, brainstem and fourth ventricle, is within normal limits. The third and lateral ventricles, and basal ganglia are unremarkable in appearance. The cerebral hemispheres are symmetric in appearance, with normal gray-white differentiation. No mass effect or midline shift is seen.  There  is no evidence of fracture; visualized osseous structures are unremarkable in appearance. The orbits are within normal limits. The paranasal sinuses and mastoid air cells are well-aerated. No significant soft tissue abnormalities are seen.  IMPRESSION: Unremarkable noncontrast CT of the head.   Electronically Signed   By: Garald Balding M.D.   On: 05/08/2013 23:36    EKG Interpretation   None       MDM   Final diagnoses:  Headache  Conversion reaction    CT is normal. Patient is asymptomatic. Symptoms resolved very suddenly like a switch being turned off.   I spoke with her at length about her multiple past presentations. My diagnostic impression at this time is that these are migraines with conversion reaction.  I think she is perfectly appropriate for discharge home. She now has a normal exam.   Tanna Furry, MD 05/08/13 [id number]

## 2013-05-08 NOTE — Discharge Instructions (Signed)
Migraine Headache A migraine headache is an intense, throbbing pain on one or both sides of your head. A migraine can last for 30 minutes to several hours. CAUSES  The exact cause of a migraine headache is not always known. However, a migraine may be caused when nerves in the brain become irritated and release chemicals that cause inflammation. This causes pain. Certain things may also trigger migraines, such as:  Alcohol.  Smoking.  Stress.  Menstruation.  Aged cheeses.  Foods or drinks that contain nitrates, glutamate, aspartame, or tyramine.  Lack of sleep.  Chocolate.  Caffeine.  Hunger.  Physical exertion.  Fatigue.  Medicines used to treat chest pain (nitroglycerine), birth control pills, estrogen, and some blood pressure medicines. SIGNS AND SYMPTOMS  Pain on one or both sides of your head.  Pulsating or throbbing pain.  Severe pain that prevents daily activities.  Pain that is aggravated by any physical activity.  Nausea, vomiting, or both.  Dizziness.  Pain with exposure to bright lights, loud noises, or activity.  General sensitivity to bright lights, loud noises, or smells. Before you get a migraine, you may get warning signs that a migraine is coming (aura). An aura may include:  Seeing flashing lights.  Seeing bright spots, halos, or zig-zag lines.  Having tunnel vision or blurred vision.  Having feelings of numbness or tingling.  Having trouble talking.  Having muscle weakness. DIAGNOSIS  A migraine headache is often diagnosed based on:  Symptoms.  Physical exam.  A CT scan or MRI of your head. These imaging tests cannot diagnose migraines, but they can help rule out other causes of headaches. TREATMENT Medicines may be given for pain and nausea. Medicines can also be given to help prevent recurrent migraines.  HOME CARE INSTRUCTIONS  Only take over-the-counter or prescription medicines for pain or discomfort as directed by your  health care provider. The use of long-term narcotics is not recommended.  Lie down in a dark, quiet room when you have a migraine.  Keep a journal to find out what may trigger your migraine headaches. For example, write down:  What you eat and drink.  How much sleep you get.  Any change to your diet or medicines.  Limit alcohol consumption.  Quit smoking if you smoke.  Get 7 9 hours of sleep, or as recommended by your health care provider.  Limit stress.  Keep lights dim if bright lights bother you and make your migraines worse. SEEK IMMEDIATE MEDICAL CARE IF:   Your migraine becomes severe.  You have a fever.  You have a stiff neck.  You have vision loss.  You have muscular weakness or loss of muscle control.  You start losing your balance or have trouble walking.  You feel faint or pass out.  You have severe symptoms that are different from your first symptoms. MAKE SURE YOU:   Understand these instructions.  Will watch your condition.  Will get help right away if you are not doing well or get worse. Document Released: 02/28/2005 Document Revised: 12/19/2012 Document Reviewed: 11/05/2012 [employer] Patient Information 2014 [employer].  

## 2013-05-08 NOTE — ED Notes (Signed)
Was sitting watching TV, started having pressure in the back of my head, face got twisted and my family could not understand me. My left leg got numb, started having fluttering in my chest and burning in my chest. My right eye closed shut by itself and my left hand started twisting up.  EMS states that patient got up and walked to the stretcher, also stated that patient's face straightened up when she talked to them. That her grips were not equal and she could not push with her feet.

## 2013-05-08 NOTE — ED Notes (Addendum)
Patient no longer has her right eye closed. Also, her face is not "twisted" to the left side. Dr. Jeneen Rinks notified.

## 2013-05-09 ENCOUNTER — Encounter: Payer: Medicaid Other | Admitting: Internal Medicine

## 2013-05-13 NOTE — ED Provider Notes (Signed)
CSN: [id number]     Arrival date & time 04/29/13  0041 History   First MD Initiated Contact with Patient 04/29/13 0100     Chief Complaint  Patient presents with  . Code Stroke     (Consider location/radiation/quality/duration/timing/severity/associated sxs/prior Treatment) HPI Comments: 53 y.o. female with a past medical history significant for HTN, hyperlipidemia, obesity, heavy smoking,  MI, chronic congestive heart failure, stroke/TIA with similar presentation and no residual deficits, s/p ICD, asthma, GERD, Bell's palsy, anxiety, brought to [facility] ED by ambulance as a code stroke due to  slurred speech and facial droop.Last known well at 11 pm last night. EMS reported that there was some type of heated argument or a fight in the house ant that they got a call from patient's grandchildren stating that Mrs. Fadely was having difficulty with her speech as well as right face droopiness. Pt noted to have the facial droop a d slurred speech, no other neuro deficits. She also reports hacing chest pain whilst having the argument.  The history is provided by the patient.    Past Medical History  Diagnosis Date  . Hypertension     09/2010-normal CMet and CBC; Lipid profile-116, 88, 25, 73  . Cardiomyopathy 08/2010    Presented with congestive heart failure; EF of 15% and 2012; hypotension on medication precludes optimal dosing  . Gastroesophageal reflux disease   . Pneumonia   . Obesity   . Hyperlipidemia   . Tobacco abuse     20 pack years  . CHF (congestive heart failure)   . Asthma   . Shortness of breath   . Headache(784.0)   . Arthritis   . Anxiety   . Neuropathy   . 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 Chronic systolic heart failure   . Dysrhythmia   . Automatic implantable cardioverter-defibrillator in situ     2013, july  . COPD (chronic obstructive pulmonary disease)     2013  . Bell palsy     states has had 3 episodes  . Stroke      stroke in  07/2012 and another in June, 2014  . Seizures     last one at age 27  . Neuromuscular disorder     Neuropathy, right foot and leg  . Hypothyroidism     Recent TSH was normal.   Past Surgical History  Procedure Laterality Date  . Cesarean section      X2  . Tee without cardioversion N/A 07/11/2012    Procedure: TRANSESOPHAGEAL ECHOCARDIOGRAM (TEE);  Surgeon: Thayer Headings, MD;  Location: [place];  Service: Cardiovascular;  Laterality: N/A;  . Tubal ligation    . Multiple extractions with alveoloplasty N/A 09/24/2012    Procedure: MULTIPLE EXTRACION #2, 4, 6, 7 ,8, 9, 11, 13, 18, 20, 21, 22, 23, 24, 25, 26, 27, 29 WITH ALVEOLOPLASTY, BIOPSY OF PALATE LESION, REMOVA RIGHT LINGUAL TORUS;  Surgeon: Gae Bon, DDS;  Location: [place];  Service: Oral Surgery;  Laterality: N/A;   Family History  Problem Relation Age of Onset  . Cardiomyopathy Mother     ICD pacemaker-ischmic CM  . Heart failure Mother   . Hypertension Mother   . Cardiomyopathy Father     Deceased  . Coronary artery disease Father   . Heart failure Father   . Heart failure Brother   . Hypertension Brother    History  Substance Use Topics  . Smoking status: Current Every Day Smoker -- 0.25  packs/day for 21 years    Types: Cigarettes  . Smokeless tobacco: Never Used     Comment: 5 ciggs daily 11-26-12  . Alcohol Use: No   OB History   Grav Para Term Preterm Abortions TAB SAB Ect Mult Living                 Review of Systems  Constitutional: Positive for activity change.  Respiratory: Negative for shortness of breath.   Cardiovascular: Positive for chest pain.  Gastrointestinal: Negative for nausea, vomiting and abdominal pain.  Genitourinary: Negative for dysuria.  Musculoskeletal: Negative for neck pain.  Neurological: Positive for speech difficulty. Negative for headaches.  All other systems reviewed and are negative.      Allergies  Fish allergy; Iodinated diagnostic agents; Iodine; Lentil;  Penicillins; Lipitor; Spironolactone; and Sulfa antibiotics  Home Medications   Current Outpatient Rx  Name  Route  Sig  Dispense  Refill  . aspirin 325 MG tablet   Oral   Take 1 tablet (325 mg total) by mouth daily.   30 tablet   5   . budesonide-formoterol (SYMBICORT) 80-4.5 MCG/ACT inhaler   Inhalation   Inhale 2 puffs into the lungs 2 (two) times daily.         . carvedilol (COREG) 25 MG tablet   Oral   Take 0.5 tablets (12.5 mg total) by mouth 2 (two) times daily with a meal.   180 tablet   0   . desloratadine (CLARINEX) 5 MG tablet   Oral   Take 5 mg by mouth daily.         . diclofenac sodium (VOLTAREN) 1 % GEL   Topical   Apply 2 g topically 4 (four) times daily as needed (pain).          Marland Kitchen diphenhydrAMINE (BENADRYL) 25 MG tablet   Oral   Take 25 mg by mouth every 6 (six) hours as needed.         . ferrous sulfate (IRON SUPPLEMENT) 325 (65 FE) MG tablet   Oral   Take 325 mg by mouth daily with breakfast.         . furosemide (LASIX) 40 MG tablet   Oral   Take 1 tablet (40 mg total) by mouth 2 (two) times daily.   180 tablet   0   . gabapentin (NEURONTIN) 300 MG capsule   Oral   Take 300 mg by mouth at bedtime.          Marland Kitchen levothyroxine (SYNTHROID, LEVOTHROID) 200 MCG tablet   Oral   Take 200 mcg by mouth daily before breakfast.         . lisinopril (PRINIVIL,ZESTRIL) 10 MG tablet   Oral   Take 1 tablet (10 mg total) by mouth daily.   90 tablet   0   . lovastatin (MEVACOR) 40 MG tablet   Oral   Take 1 tablet (40 mg total) by mouth at bedtime.   90 tablet   0   . Naproxen Sodium (ALEVE PO)   Oral   Take 2 tablets by mouth at bedtime as needed (for pain).         Marland Kitchen oxymetazoline (AFRIN) 0.05 % nasal spray   Nasal   Place 2 sprays into the nose daily as needed for congestion.         . potassium chloride SA (K-DUR,KLOR-CON) 20 MEQ tablet   Oral   Take 2 tablets (40 mEq total) by mouth 2 (two)  times daily.   180 tablet    0   . ranitidine (ZANTAC) 150 MG tablet   Oral   Take 150 mg by mouth 2 (two) times daily.          Marland Kitchen tiotropium (SPIRIVA) 18 MCG inhalation capsule   Inhalation   Place 18 mcg into inhaler and inhale daily.         Marland Kitchen acetaminophen (TYLENOL) 325 MG tablet   Oral   Take 2 tablets (650 mg total) by mouth every 4 (four) hours as needed for mild pain (temperature >/= 99.5 F).         Marland Kitchen albuterol (PROVENTIL HFA;VENTOLIN HFA) 108 (90 BASE) MCG/ACT inhaler   Inhalation   Inhale 2 puffs into the lungs every 6 (six) hours as needed for wheezing or shortness of breath.   1 Inhaler   5    BP 133/76  Pulse 90  Temp(Src) 98.1 F (36.7 C) (Oral)  Resp 20  Ht 5\' 7"  (1.702 m)  Wt 287 lb 8 oz (130.409 kg)  BMI 45.02 kg/m2  SpO2 93% Physical Exam  Constitutional: She is oriented to person, place, and time. She appears well-developed and well-nourished.  HENT:  Head: Normocephalic and atraumatic.  Eyes: EOM are normal. Pupils are equal, round, and reactive to light.  Neck: Neck supple.  Cardiovascular: Normal rate, regular rhythm and normal heart sounds.   No murmur heard. Pulmonary/Chest: Effort normal. No respiratory distress.  Abdominal: Soft. She exhibits no distension. There is no tenderness. There is no rebound and no guarding.  Neurological: She is alert and oriented to person, place, and time.  Right facial droop, clenched jaw, slurred speech  Skin: Skin is warm and dry.    ED Course  Procedures (including critical care time) Labs Review Labs Reviewed  CBC - Abnormal; Notable for the following:    WBC 13.7 (*)    Hemoglobin 15.1 (*)    All other components within normal limits  DIFFERENTIAL - Abnormal; Notable for the following:    Neutro Abs 8.8 (*)    All other components within normal limits  COMPREHENSIVE METABOLIC PANEL - Abnormal; Notable for the following:    Glucose, Bld 103 (*)    GFR calc non Af Amer 69 (*)    GFR calc Af Amer 80 (*)    All other  components within normal limits  HEMOGLOBIN A1C - Abnormal; Notable for the following:    Hemoglobin A1C 5.7 (*)    Mean Plasma Glucose 117 (*)    All other components within normal limits  GLUCOSE, CAPILLARY - Abnormal; Notable for the following:    Glucose-Capillary 100 (*)    All other components within normal limits  LIPID PANEL - Abnormal; Notable for the following:    Cholesterol 240 (*)    Triglycerides 158 (*)    LDL Cholesterol 155 (*)    All other components within normal limits  URINALYSIS, ROUTINE W REFLEX MICROSCOPIC - Abnormal; Notable for the following:    Leukocytes, UA SMALL (*)    All other components within normal limits  URINE MICROSCOPIC-ADD ON - Abnormal; Notable for the following:    Squamous Epithelial / LPF FEW (*)    Bacteria, UA FEW (*)    All other components within normal limits  GLUCOSE, CAPILLARY - Abnormal; Notable for the following:    Glucose-Capillary 153 (*)    All other components within normal limits  POCT I-STAT, CHEM 8 - Abnormal; Notable for the  following:    Glucose, Bld 106 (*)    Hemoglobin 16.3 (*)    HCT 48.0 (*)    All other components within normal limits  APTT  TROPONIN I  PROTIME-INR  GLUCOSE, CAPILLARY  TROPONIN I  GLUCOSE, CAPILLARY  TROPONIN I  TROPONIN I  TROPONIN I  POCT I-STAT TROPONIN I   Imaging Review No results found.   EKG Interpretation   Date/Time:  Monday April 29 2013 01:00:31 EST Ventricular Rate:  78 PR Interval:  178 QRS Duration: 111 QT Interval:  428 QTC Calculation: 487 R Axis:   -8 Text Interpretation:  Age not entered, assumed to be  53 years old for  purpose of ECG interpretation Sinus rhythm Ventricular premature complex  Borderline repolarization abnormality Borderline prolonged QT interval  Confirmed by Kathrynn Humble, MD, Kyara Boxer ([id number]) on 04/29/2013 1:41:31 AM      MDM   Final diagnoses:  Slurred speech  Facial droop  Chest pain   Pt comes in with cc of slurred speech and facial  droop. Also had chest pains. Reports hx of MI, CVA.  Neuro team deemed not a candidate for tpa. Hx of strokes, and a low NIHSS and possibly jaw clenching leading to the droop cited as the reasons.  Medicine to admit for stroke r/o and ACS r/o.  Varney Biles, MD 05/13/13 1118

## 2013-05-15 ENCOUNTER — Ambulatory Visit (INDEPENDENT_AMBULATORY_CARE_PROVIDER_SITE_OTHER): Payer: Medicaid Other | Admitting: Internal Medicine

## 2013-05-15 ENCOUNTER — Encounter: Payer: Self-pay | Admitting: Internal Medicine

## 2013-05-15 VITALS — BP 118/80 | HR 85 | Ht 67.5 in | Wt 282.4 lb

## 2013-05-15 DIAGNOSIS — Z95810 Presence of automatic (implantable) cardiac defibrillator: Secondary | ICD-10-CM

## 2013-05-15 DIAGNOSIS — I428 Other cardiomyopathies: Secondary | ICD-10-CM

## 2013-05-15 DIAGNOSIS — E669 Obesity, unspecified: Secondary | ICD-10-CM

## 2013-05-15 DIAGNOSIS — I429 Cardiomyopathy, unspecified: Secondary | ICD-10-CM

## 2013-05-15 DIAGNOSIS — I5022 Chronic systolic (congestive) heart failure: Secondary | ICD-10-CM

## 2013-05-15 LAB — MDC_IDC_ENUM_SESS_TYPE_INCLINIC
Date Time Interrogation Session: 20150304104914
HIGH POWER IMPEDANCE MEASURED VALUE: 87.75 Ohm
Implantable Pulse Generator Serial Number: 1022442
Lead Channel Impedance Value: 562.5 Ohm
Lead Channel Pacing Threshold Amplitude: 0.75 V
Lead Channel Pacing Threshold Pulse Width: 0.5 ms
Lead Channel Sensing Intrinsic Amplitude: 12 mV
MDC IDC MSMT BATTERY REMAINING LONGEVITY: 84 mo
MDC IDC SET LEADCHNL RV PACING AMPLITUDE: 2.5 V
MDC IDC SET LEADCHNL RV PACING PULSEWIDTH: 0.5 ms
MDC IDC SET LEADCHNL RV SENSING SENSITIVITY: 0.5 mV
MDC IDC SET ZONE DETECTION INTERVAL: 300 ms
MDC IDC STAT BRADY RV PERCENT PACED: 0 %

## 2013-05-15 NOTE — Progress Notes (Signed)
HPI [name] returns today for followup. She is a very pleasant 53 year old woman with an ischemic cardiomyopathy, chronic systolic heart failure, severe back pain, hypertension, and COPD. She denies chest pain or shortness of breath. No ICD shock. No peripheral edema. She continues to smoke cigarettes but has reduced her consumption down to a half pack a day. She is trying to lose weight.  Allergies  Allergen Reactions  . Fish Allergy Anaphylaxis  . Iodinated Diagnostic Agents Hives and Other (See Comments)    Pulmonary problems; no frank respiratory arrest  . Iodine Hives and Other (See Comments)    Pulmonary Problems   . Lentil Anaphylaxis  . Penicillins Anaphylaxis and Shortness Of Breath    Hair loss  . Lipitor [Atorvastatin] Itching and Rash  . Spironolactone Rash  . Sulfa Antibiotics Other (See Comments)    Unknown     Current Outpatient Prescriptions  Medication Sig Dispense Refill  . acetaminophen (TYLENOL) 325 MG tablet Take 2 tablets (650 mg total) by mouth every 4 (four) hours as needed for mild pain (temperature >/= 99.5 F).      Marland Kitchen albuterol (PROVENTIL HFA;VENTOLIN HFA) 108 (90 BASE) MCG/ACT inhaler Inhale 2 puffs into the lungs every 6 (six) hours as needed for wheezing or shortness of breath.  1 Inhaler  5  . aspirin 325 MG tablet Take 1 tablet (325 mg total) by mouth daily.  30 tablet  5  . budesonide-formoterol (SYMBICORT) 80-4.5 MCG/ACT inhaler Inhale 2 puffs into the lungs 2 (two) times daily.      . carvedilol (COREG) 25 MG tablet Take 0.5 tablets (12.5 mg total) by mouth 2 (two) times daily with a meal.  180 tablet  0  . desloratadine (CLARINEX) 5 MG tablet Take 5 mg by mouth daily.      . diclofenac sodium (VOLTAREN) 1 % GEL Apply 2 g topically 4 (four) times daily as needed (pain).       Marland Kitchen diphenhydrAMINE (BENADRYL) 25 MG tablet Take 25 mg by mouth every 6 (six) hours as needed.      . ferrous sulfate (IRON SUPPLEMENT) 325 (65 FE) MG tablet Take 325 mg by mouth  daily with breakfast.      . furosemide (LASIX) 40 MG tablet Take 1 tablet (40 mg total) by mouth 2 (two) times daily.  180 tablet  0  . gabapentin (NEURONTIN) 300 MG capsule Take 300 mg by mouth at bedtime.       . Levothyroxine Sodium 25 MCG CAPS Take by mouth daily before breakfast.      . lisinopril (PRINIVIL,ZESTRIL) 10 MG tablet Take 1 tablet (10 mg total) by mouth daily.  90 tablet  0  . loratadine (LORATADINE ALLERGY RELIEF) 10 MG dissolvable tablet Take 10 mg by mouth daily.      Marland Kitchen lovastatin (MEVACOR) 40 MG tablet Take 1 tablet (40 mg total) by mouth at bedtime.  90 tablet  0  . Naproxen Sodium (ALEVE PO) Take 2 tablets by mouth at bedtime as needed (for pain).      Marland Kitchen oxymetazoline (AFRIN) 0.05 % nasal spray Place 2 sprays into the nose daily as needed for congestion.      . potassium chloride SA (K-DUR,KLOR-CON) 20 MEQ tablet Take 2 tablets (40 mEq total) by mouth 2 (two) times daily.  180 tablet  0  . ranitidine (ZANTAC) 150 MG tablet Take 150 mg by mouth 2 (two) times daily.       Marland Kitchen tiotropium (SPIRIVA) 18 MCG  inhalation capsule Place 18 mcg into inhaler and inhale daily.       No current facility-administered medications for this visit.     Past Medical History  Diagnosis Date  . Hypertension     09/2010-normal CMet and CBC; Lipid profile-116, 88, 25, 73  . Cardiomyopathy 08/2010    Presented with congestive heart failure; EF of 15% and 2012; hypotension on medication precludes optimal dosing  . Gastroesophageal reflux disease   . Pneumonia   . Obesity   . Hyperlipidemia   . Tobacco abuse     20 pack years  . CHF (congestive heart failure)   . Asthma   . Shortness of breath   . Headache(784.0)   . Arthritis   . Anxiety   . Neuropathy   . 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 Chronic systolic heart failure   . Dysrhythmia   . Automatic implantable cardioverter-defibrillator in situ     2013, july  . COPD (chronic obstructive pulmonary  disease)     2013  . Bell palsy     states has had 3 episodes  . Stroke      stroke in 07/2012 and another in June, 2014  . Seizures     last one at age 80  . Neuromuscular disorder     Neuropathy, right foot and leg  . Hypothyroidism     Recent TSH was normal.    ROS:   All systems reviewed and negative except as noted in the HPI.   Past Surgical History  Procedure Laterality Date  . Cesarean section      X2  . Tee without cardioversion N/A 07/11/2012    Procedure: TRANSESOPHAGEAL ECHOCARDIOGRAM (TEE);  Surgeon: Thayer Headings, MD;  Location: [place];  Service: Cardiovascular;  Laterality: N/A;  . Tubal ligation    . Multiple extractions with alveoloplasty N/A 09/24/2012    Procedure: MULTIPLE EXTRACION #2, 4, 6, 7 ,8, 9, 11, 13, 18, 20, 21, 22, 23, 24, 25, 26, 27, 29 WITH ALVEOLOPLASTY, BIOPSY OF PALATE LESION, REMOVA RIGHT LINGUAL TORUS;  Surgeon: Gae Bon, DDS;  Location: [place];  Service: Oral Surgery;  Laterality: N/A;     Family History  Problem Relation Age of Onset  . Cardiomyopathy Mother     ICD pacemaker-ischmic CM  . Heart failure Mother   . Hypertension Mother   . Cardiomyopathy Father     Deceased  . Coronary artery disease Father   . Heart failure Father   . Heart failure Brother   . Hypertension Brother      History   Social History  . Marital Status: Divorced    Spouse Name: N/A    Number of Children: 2  . Years of Education: N/A   Occupational History  .      Works in Environmental consultant   Social History Main Topics  . Smoking status: Current Every Day Smoker -- 0.25 packs/day for 21 years    Types: Cigarettes  . Smokeless tobacco: Never Used     Comment: 5 ciggs daily 11-26-12  . Alcohol Use: No  . Drug Use: No  . Sexual Activity: No   Other Topics Concern  . Not on file   Social History Narrative  . No narrative on file     BP 118/80  Pulse 85  Ht 5' 7.5" (1.715 m)  Wt 282 lb 6.4 oz (128.096 kg)  BMI 43.55  kg/m2  Physical Exam:  obese  appearing middle aged woman, NAD HEENT: Unremarkable Neck:  No JVD, no thyromegally Back:  No CVA tenderness Lungs:  Clear with no wheezes HEART:  Regular rate rhythm, no murmurs, no rubs, no clicks Abd:  soft, positive bowel sounds, no organomegally, no rebound, no guarding Ext:  2 plus pulses, no edema, no cyanosis, no clubbing Skin:  No rashes no nodules Neuro:  CN II through XII intact, motor grossly intact   DEVICE  Normal device function.  See PaceArt for details.   Assess/Plan:

## 2013-05-15 NOTE — Assessment & Plan Note (Signed)
I spent 20 minutes counseling the patient about weight loss, reducing her carbohydrates and a low fat diet and exercise. She has lost 7 lbs and hopefully will lose much more.

## 2013-05-15 NOTE — Patient Instructions (Signed)
Your physician recommends that you schedule a follow-up appointment in: 12 months with Dr Taylor You will receive a reminder letter two months in advance reminding you to call and schedule your appointment. If you don't receive this letter, please contact our office.  Remote monitoring is used to monitor your Pacemaker or ICD from home. This monitoring reduces the number of office visits required to check your device to one time per year. It allows us to keep an eye on the functioning of your device to ensure it is working properly. You are scheduled for a device check from home on 08-15-13. You may send your transmission at any time that day. If you have a wireless device, the transmission will be sent automatically. After your physician reviews your transmission, you will receive a postcard with your next transmission date.    

## 2013-05-15 NOTE — Assessment & Plan Note (Signed)
Her [employer] single chamber device is working normally. Will recheck in several months.

## 2013-05-15 NOTE — Assessment & Plan Note (Signed)
Her symptoms remain class 2. I have discussed the importance of a low sodium diet, exercise and continuing her medical therapy and remaining compliant.

## 2013-05-24 ENCOUNTER — Encounter: Payer: Self-pay | Admitting: Internal Medicine

## 2013-06-21 ENCOUNTER — Encounter (HOSPITAL_COMMUNITY): Payer: Self-pay | Admitting: Emergency Medicine

## 2013-06-21 ENCOUNTER — Emergency Department (HOSPITAL_COMMUNITY)
Admission: EM | Admit: 2013-06-21 | Discharge: 2013-06-21 | Disposition: A | Discharge status: Home / Self Care | Payer: Medicaid Other | Attending: Emergency Medicine | Admitting: Emergency Medicine

## 2013-06-21 ENCOUNTER — Emergency Department (HOSPITAL_COMMUNITY): Payer: Medicaid Other

## 2013-06-21 DIAGNOSIS — E039 Hypothyroidism, unspecified: Secondary | ICD-10-CM | POA: Insufficient documentation

## 2013-06-21 DIAGNOSIS — G40909 Epilepsy, unspecified, not intractable, without status epilepticus: Secondary | ICD-10-CM | POA: Insufficient documentation

## 2013-06-21 DIAGNOSIS — Z8701 Personal history of pneumonia (recurrent): Secondary | ICD-10-CM | POA: Insufficient documentation

## 2013-06-21 DIAGNOSIS — F17200 Nicotine dependence, unspecified, uncomplicated: Secondary | ICD-10-CM | POA: Insufficient documentation

## 2013-06-21 DIAGNOSIS — J4489 Other specified chronic obstructive pulmonary disease: Secondary | ICD-10-CM | POA: Insufficient documentation

## 2013-06-21 DIAGNOSIS — F411 Generalized anxiety disorder: Secondary | ICD-10-CM | POA: Insufficient documentation

## 2013-06-21 DIAGNOSIS — Z9889 Other specified postprocedural states: Secondary | ICD-10-CM | POA: Insufficient documentation

## 2013-06-21 DIAGNOSIS — J449 Chronic obstructive pulmonary disease, unspecified: Secondary | ICD-10-CM | POA: Insufficient documentation

## 2013-06-21 DIAGNOSIS — K219 Gastro-esophageal reflux disease without esophagitis: Secondary | ICD-10-CM | POA: Insufficient documentation

## 2013-06-21 DIAGNOSIS — I10 Essential (primary) hypertension: Secondary | ICD-10-CM | POA: Insufficient documentation

## 2013-06-21 DIAGNOSIS — I5022 Chronic systolic (congestive) heart failure: Secondary | ICD-10-CM | POA: Insufficient documentation

## 2013-06-21 DIAGNOSIS — R079 Chest pain, unspecified: Secondary | ICD-10-CM

## 2013-06-21 DIAGNOSIS — Z7982 Long term (current) use of aspirin: Secondary | ICD-10-CM | POA: Insufficient documentation

## 2013-06-21 DIAGNOSIS — R0789 Other chest pain: Secondary | ICD-10-CM | POA: Insufficient documentation

## 2013-06-21 DIAGNOSIS — E785 Hyperlipidemia, unspecified: Secondary | ICD-10-CM | POA: Insufficient documentation

## 2013-06-21 DIAGNOSIS — E669 Obesity, unspecified: Secondary | ICD-10-CM | POA: Insufficient documentation

## 2013-06-21 DIAGNOSIS — Z880 Allergy status to penicillin: Secondary | ICD-10-CM | POA: Insufficient documentation

## 2013-06-21 DIAGNOSIS — Z8673 Personal history of transient ischemic attack (TIA), and cerebral infarction without residual deficits: Secondary | ICD-10-CM | POA: Insufficient documentation

## 2013-06-21 DIAGNOSIS — IMO0002 Reserved for concepts with insufficient information to code with codable children: Secondary | ICD-10-CM | POA: Insufficient documentation

## 2013-06-21 DIAGNOSIS — Z95810 Presence of automatic (implantable) cardiac defibrillator: Secondary | ICD-10-CM | POA: Insufficient documentation

## 2013-06-21 DIAGNOSIS — Z79899 Other long term (current) drug therapy: Secondary | ICD-10-CM | POA: Insufficient documentation

## 2013-06-21 DIAGNOSIS — M129 Arthropathy, unspecified: Secondary | ICD-10-CM | POA: Insufficient documentation

## 2013-06-21 LAB — BASIC METABOLIC PANEL
BUN: 17 mg/dL (ref 6–23)
CALCIUM: 9.1 mg/dL (ref 8.4–10.5)
CO2: 27 mEq/L (ref 19–32)
Chloride: 101 mEq/L (ref 96–112)
Creatinine, Ser: 0.76 mg/dL (ref 0.50–1.10)
GFR calc Af Amer: >90 mL/min (ref >90)
GFR calc non Af Amer: >90 mL/min (ref >90)
Glucose, Bld: 123 mg/dL — ABNORMAL HIGH (ref 70–99)
Potassium: 3.9 mEq/L (ref 3.7–5.3)
Sodium: 139 mEq/L (ref 137–147)

## 2013-06-21 LAB — CBC
HCT: 40.4 % (ref 36.0–46.0)
Hemoglobin: 13 g/dL (ref 12.0–15.0)
MCH: 30 pg (ref 26.0–34.0)
MCHC: 32.2 g/dL (ref 30.0–36.0)
MCV: 93.3 fL (ref 78.0–100.0)
Platelets: 247 10*3/uL (ref 150–400)
RBC: 4.33 MIL/uL (ref 3.87–5.11)
RDW: 14.9 % (ref 11.5–15.5)
WBC: 9.7 10*3/uL (ref 4.0–10.5)

## 2013-06-21 LAB — TROPONIN I: Troponin I: <0.3 ng/mL (ref <0.30)

## 2013-06-21 LAB — PROTIME-INR
INR: 1.02 (ref 0.00–1.49)
Prothrombin Time: 13.2 seconds (ref 11.6–15.2)

## 2013-06-21 LAB — PRO B NATRIURETIC PEPTIDE: PRO B NATRI PEPTIDE: 81.7 pg/mL (ref 0–125)

## 2013-06-21 NOTE — ED Notes (Signed)
Onset of chest pressure 1:15 am. 9/10 pressure initially, now 5/10. Denies nausea, sweating. Did have increased sob

## 2013-06-21 NOTE — Discharge Instructions (Signed)
Chest Pain (Nonspecific) °It is often hard to give a specific diagnosis for the cause of chest pain. There is always a chance that your pain could be related to something serious, such as a heart attack or a blood clot in the lungs. You need to follow up with your caregiver for further evaluation. °CAUSES  °· Heartburn. °· Pneumonia or bronchitis. °· Anxiety or stress. °· Inflammation around your heart (pericarditis) or lung (pleuritis or pleurisy). °· A blood clot in the lung. °· A collapsed lung (pneumothorax). It can develop suddenly on its own (spontaneous pneumothorax) or from injury (trauma) to the chest. °· Shingles infection (herpes zoster virus). °The chest wall is composed of bones, muscles, and cartilage. Any of these can be the source of the pain. °· The bones can be bruised by injury. °· The muscles or cartilage can be strained by coughing or overwork. °· The cartilage can be affected by inflammation and become sore (costochondritis). °DIAGNOSIS  °Lab tests or other studies, such as X-rays, electrocardiography, stress testing, or cardiac imaging, may be needed to find the cause of your pain.  °TREATMENT  °· Treatment depends on what may be causing your chest pain. Treatment may include: °· Acid blockers for heartburn. °· Anti-inflammatory medicine. °· Pain medicine for inflammatory conditions. °· Antibiotics if an infection is present. °· You may be advised to change lifestyle habits. This includes stopping smoking and avoiding alcohol, caffeine, and chocolate. °· You may be advised to keep your head raised (elevated) when sleeping. This reduces the chance of acid going backward from your stomach into your esophagus. °· Most of the time, nonspecific chest pain will improve within 2 to 3 days with rest and mild pain medicine. °HOME CARE INSTRUCTIONS  °· If antibiotics were prescribed, take your antibiotics as directed. Finish them even if you start to feel better. °· For the next few days, avoid physical  activities that bring on chest pain. Continue physical activities as directed. °· Do not smoke. °· Avoid drinking alcohol. °· Only take over-the-counter or prescription medicine for pain, discomfort, or fever as directed by your caregiver. °· Follow your caregiver's suggestions for further testing if your chest pain does not go away. °· Keep any follow-up appointments you made. If you do not go to an appointment, you could develop lasting (chronic) problems with pain. If there is any problem keeping an appointment, you must call to reschedule. °SEEK MEDICAL CARE IF:  °· You think you are having problems from the medicine you are taking. Read your medicine instructions carefully. °· Your chest pain does not go away, even after treatment. °· You develop a rash with blisters on your chest. °SEEK IMMEDIATE MEDICAL CARE IF:  °· You have increased chest pain or pain that spreads to your arm, neck, jaw, back, or abdomen. °· You develop shortness of breath, an increasing cough, or you are coughing up blood. °· You have severe back or abdominal pain, feel nauseous, or vomit. °· You develop severe weakness, fainting, or chills. °· You have a fever. °THIS IS AN EMERGENCY. Do not wait to see if the pain will go away. Get medical help at once. Call your local emergency services (911 in [place] Do not drive yourself to the hospital. °MAKE SURE YOU:  °· Understand these instructions. °· Will watch your condition. °· Will get help right away if you are not doing well or get worse. °Document Released: 12/08/2004 Document Revised: 05/23/2011 Document Reviewed: 10/04/2007 °[employer]® Patient Information ©2014 [employer]   [employer]. ° °

## 2013-06-21 NOTE — ED Provider Notes (Signed)
CSN: [id number]     Arrival date & time 06/21/13  0215 History   First MD Initiated Contact with Patient 06/21/13 0224     Chief Complaint  Patient presents with  . Chest Pain      The history is provided by the patient.   is reports 15-20 minutes of left-sided chest discomfort described as a pressure.  She's had these symptoms before but it seemed slightly more severe this evening that she came to the ER for evaluation.  She reports mild shortness of breath.  This all seemed to resolve on its own.  She presents the ER with minimal discomfort at this time.  Patient does have a history of ischemic cardiomyopathy with EF at one point being 15% and now is upper on 25%.  She does have an ICD in place.  She states that prior history of cardiac catheterization.  She's compliant with her medications.  She continues to smoke cigarettes.  She denies productive cough.  No fevers or chills.  No shortness of breath at this time.  No abdominal pain.  No nausea vomiting or diarrhea.  Past Medical History  Diagnosis Date  . Hypertension     09/2010-normal CMet and CBC; Lipid profile-116, 88, 25, 73  . Cardiomyopathy 08/2010    Presented with congestive heart failure; EF of 15% and 2012; hypotension on medication precludes optimal dosing  . Gastroesophageal reflux disease   . Pneumonia   . Obesity   . Hyperlipidemia   . Tobacco abuse     20 pack years  . CHF (congestive heart failure)   . Asthma   . Shortness of breath   . Headache(784.0)   . Arthritis   . Anxiety   . Neuropathy   . 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 Chronic systolic heart failure   . Dysrhythmia   . Automatic implantable cardioverter-defibrillator in situ     2013, july  . COPD (chronic obstructive pulmonary disease)     2013  . Bell palsy     states has had 3 episodes  . Stroke      stroke in 07/2012 and another in June, 2014  . Seizures     last one at age 83  . Neuromuscular disorder    Neuropathy, right foot and leg  . Hypothyroidism     Recent TSH was normal.   Past Surgical History  Procedure Laterality Date  . Cesarean section      X2  . Tee without cardioversion N/A 07/11/2012    Procedure: TRANSESOPHAGEAL ECHOCARDIOGRAM (TEE);  Surgeon: Thayer Headings, MD;  Location: [place];  Service: Cardiovascular;  Laterality: N/A;  . Tubal ligation    . Multiple extractions with alveoloplasty N/A 09/24/2012    Procedure: MULTIPLE EXTRACION #2, 4, 6, 7 ,8, 9, 11, 13, 18, 20, 21, 22, 23, 24, 25, 26, 27, 29 WITH ALVEOLOPLASTY, BIOPSY OF PALATE LESION, REMOVA RIGHT LINGUAL TORUS;  Surgeon: Gae Bon, DDS;  Location: [place];  Service: Oral Surgery;  Laterality: N/A;   Family History  Problem Relation Age of Onset  . Cardiomyopathy Mother     ICD pacemaker-ischmic CM  . Heart failure Mother   . Hypertension Mother   . Cardiomyopathy Father     Deceased  . Coronary artery disease Father   . Heart failure Father   . Heart failure Brother   . Hypertension Brother    History  Substance Use Topics  . Smoking status: Current  Every Day Smoker -- 0.25 packs/day for 21 years    Types: Cigarettes  . Smokeless tobacco: Never Used     Comment: 5 ciggs daily 11-26-12  . Alcohol Use: No   OB History   Grav Para Term Preterm Abortions TAB SAB Ect Mult Living                 Review of Systems  Cardiovascular: Positive for chest pain.  All other systems reviewed and are negative.     Allergies  Fish allergy; Iodinated diagnostic agents; Iodine; Lentil; Penicillins; Lipitor; Spironolactone; and Sulfa antibiotics  Home Medications   Current Outpatient Rx  Name  Route  Sig  Dispense  Refill  . aspirin EC 81 MG tablet   Oral   Take 81 mg by mouth daily.         Marland Kitchen acetaminophen (TYLENOL) 325 MG tablet   Oral   Take 2 tablets (650 mg total) by mouth every 4 (four) hours as needed for mild pain (temperature >/= 99.5 F).         Marland Kitchen albuterol (PROVENTIL  HFA;VENTOLIN HFA) 108 (90 BASE) MCG/ACT inhaler   Inhalation   Inhale 2 puffs into the lungs every 6 (six) hours as needed for wheezing or shortness of breath.   1 Inhaler   5   . aspirin 325 MG tablet   Oral   Take 1 tablet (325 mg total) by mouth daily.   30 tablet   5   . budesonide-formoterol (SYMBICORT) 80-4.5 MCG/ACT inhaler   Inhalation   Inhale 2 puffs into the lungs 2 (two) times daily.         . carvedilol (COREG) 25 MG tablet   Oral   Take 0.5 tablets (12.5 mg total) by mouth 2 (two) times daily with a meal.   180 tablet   0   . desloratadine (CLARINEX) 5 MG tablet   Oral   Take 5 mg by mouth daily.         . diclofenac sodium (VOLTAREN) 1 % GEL   Topical   Apply 2 g topically 4 (four) times daily as needed (pain).          Marland Kitchen diphenhydrAMINE (BENADRYL) 25 MG tablet   Oral   Take 25 mg by mouth every 6 (six) hours as needed.         . ferrous sulfate (IRON SUPPLEMENT) 325 (65 FE) MG tablet   Oral   Take 325 mg by mouth daily with breakfast.         . furosemide (LASIX) 40 MG tablet   Oral   Take 1 tablet (40 mg total) by mouth 2 (two) times daily.   180 tablet   0   . gabapentin (NEURONTIN) 300 MG capsule   Oral   Take 300 mg by mouth at bedtime.          . Levothyroxine Sodium 25 MCG CAPS   Oral   Take 200 mcg by mouth daily before breakfast.          . lisinopril (PRINIVIL,ZESTRIL) 10 MG tablet   Oral   Take 1 tablet (10 mg total) by mouth daily.   90 tablet   0   . loratadine (LORATADINE ALLERGY RELIEF) 10 MG dissolvable tablet   Oral   Take 10 mg by mouth daily.         Marland Kitchen lovastatin (MEVACOR) 40 MG tablet   Oral   Take 1 tablet (40 mg total)  by mouth at bedtime.   90 tablet   0   . Naproxen Sodium (ALEVE PO)   Oral   Take 2 tablets by mouth at bedtime as needed (for pain).         Marland Kitchen oxymetazoline (AFRIN) 0.05 % nasal spray   Nasal   Place 2 sprays into the nose daily as needed for congestion.         .  potassium chloride SA (K-DUR,KLOR-CON) 20 MEQ tablet   Oral   Take 2 tablets (40 mEq total) by mouth 2 (two) times daily.   180 tablet   0   . ranitidine (ZANTAC) 150 MG tablet   Oral   Take 150 mg by mouth 2 (two) times daily.          Marland Kitchen tiotropium (SPIRIVA) 18 MCG inhalation capsule   Inhalation   Place 18 mcg into inhaler and inhale daily.          BP 106/63  Pulse 70  Temp(Src) 98 F (36.7 C) (Oral)  Resp 17  Ht 5\' 7"  (1.702 m)  Wt 280 lb (127.007 kg)  BMI 43.84 kg/m2  SpO2 93% Physical Exam  Nursing note and vitals reviewed. Constitutional: She is oriented to person, place, and time. She appears well-developed and well-nourished. No distress.  HENT:  Head: Normocephalic and atraumatic.  Eyes: EOM are normal.  Neck: Normal range of motion.  Cardiovascular: Normal rate, regular rhythm and normal heart sounds.   Pulmonary/Chest: Effort normal and breath sounds normal.  Abdominal: Soft. She exhibits no distension. There is no tenderness.  Musculoskeletal: Normal range of motion.  Neurological: She is alert and oriented to person, place, and time.  Skin: Skin is warm and dry.  Psychiatric: She has a normal mood and affect. Judgment normal.    ED Course  Procedures (including critical care time) Labs Review Labs Reviewed  BASIC METABOLIC PANEL - Abnormal; Notable for the following:    Glucose, Bld 123 (*)    All other components within normal limits  CBC  PRO B NATRIURETIC PEPTIDE  TROPONIN I  PROTIME-INR  TROPONIN I   Imaging Review Dg Chest Port 1 View  06/21/2013   CLINICAL DATA:  Chest pain.  EXAM: PORTABLE CHEST - 1 VIEW  COMPARISON:  04/29/2013  FINDINGS: Chronic cardiopericardial enlargement. Unchanged orientation of a right ventricular ICD/ pacer lead. Unchanged upper mediastinal contours. Diffuse interstitial coarsening, above baseline. Haziness of the lower chest suggests small pleural effusions. No pneumothorax.  IMPRESSION: Mild CHF.    Electronically Signed   By: Jorje Guild M.D.   On: 06/21/2013 03:15  I personally reviewed the imaging tests through PACS system I reviewed available ER/hospitalization records through the EMR    EKG Interpretation   Date/Time:  Friday June 21 2013 02:11:15 EDT Ventricular Rate:  79 PR Interval:  146 QRS Duration: 114 QT Interval:  388 QTC Calculation: 444 R Axis:   -13 Text Interpretation:  Sinus rhythm with occasional Premature ventricular  complexes Nonspecific ST and T wave abnormality Abnormal ECG When compared  with ECG of 08-May-2013 22:14, Nonspecific T wave abnormality now evident  in Inferior leads T wave inversion now evident in Lateral leads QT has  shortened Confirmed by Johan Creveling  MD, Dola Lunsford ([id number]) on 06/21/2013 2:31:12 AM      MDM   Final diagnoses:  Chest pain    Both typical and atypical components to her discomfort.  EKG is without significant abnormalities.  Nonspecific T wave changes.  No clear ischemic changes.  Pain-free at this time.  Initial troponin is negative.  Second troponin will be cycled.  This is stable after 3 hours the patient should be stable for discharge with outpatient cardiology followup.  5:56 AM Feeling better. Slept in ER majority of night. Dc home with outpatient cardiology follow up    Hoy Morn, MD 06/21/13 [phone]

## 2013-06-21 NOTE — ED Notes (Signed)
Remains painfree, will f/u with cardiologist.

## 2013-06-21 NOTE — ED Notes (Signed)
Sleeping soundly 

## 2013-06-21 NOTE — ED Notes (Signed)
Awake briefly, pain/pressure free.

## 2013-06-21 NOTE — ED Notes (Signed)
sleeping

## 2013-08-14 ENCOUNTER — Emergency Department (HOSPITAL_COMMUNITY): Payer: Medicaid Other

## 2013-08-14 ENCOUNTER — Emergency Department (HOSPITAL_COMMUNITY)
Admission: EM | Admit: 2013-08-14 | Discharge: 2013-08-14 | Disposition: A | Discharge status: Home / Self Care | Payer: Medicaid Other | Attending: Emergency Medicine | Admitting: Emergency Medicine

## 2013-08-14 DIAGNOSIS — M129 Arthropathy, unspecified: Secondary | ICD-10-CM | POA: Insufficient documentation

## 2013-08-14 DIAGNOSIS — E039 Hypothyroidism, unspecified: Secondary | ICD-10-CM | POA: Insufficient documentation

## 2013-08-14 DIAGNOSIS — I10 Essential (primary) hypertension: Secondary | ICD-10-CM | POA: Insufficient documentation

## 2013-08-14 DIAGNOSIS — J449 Chronic obstructive pulmonary disease, unspecified: Secondary | ICD-10-CM | POA: Insufficient documentation

## 2013-08-14 DIAGNOSIS — Z791 Long term (current) use of non-steroidal anti-inflammatories (NSAID): Secondary | ICD-10-CM | POA: Insufficient documentation

## 2013-08-14 DIAGNOSIS — Z8669 Personal history of other diseases of the nervous system and sense organs: Secondary | ICD-10-CM | POA: Insufficient documentation

## 2013-08-14 DIAGNOSIS — J4489 Other specified chronic obstructive pulmonary disease: Secondary | ICD-10-CM | POA: Insufficient documentation

## 2013-08-14 DIAGNOSIS — Z8701 Personal history of pneumonia (recurrent): Secondary | ICD-10-CM | POA: Insufficient documentation

## 2013-08-14 DIAGNOSIS — Z8673 Personal history of transient ischemic attack (TIA), and cerebral infarction without residual deficits: Secondary | ICD-10-CM | POA: Insufficient documentation

## 2013-08-14 DIAGNOSIS — E669 Obesity, unspecified: Secondary | ICD-10-CM | POA: Insufficient documentation

## 2013-08-14 DIAGNOSIS — R079 Chest pain, unspecified: Secondary | ICD-10-CM | POA: Insufficient documentation

## 2013-08-14 DIAGNOSIS — Z880 Allergy status to penicillin: Secondary | ICD-10-CM | POA: Insufficient documentation

## 2013-08-14 DIAGNOSIS — M62838 Other muscle spasm: Secondary | ICD-10-CM | POA: Insufficient documentation

## 2013-08-14 DIAGNOSIS — Z95810 Presence of automatic (implantable) cardiac defibrillator: Secondary | ICD-10-CM | POA: Insufficient documentation

## 2013-08-14 DIAGNOSIS — I5022 Chronic systolic (congestive) heart failure: Secondary | ICD-10-CM | POA: Insufficient documentation

## 2013-08-14 DIAGNOSIS — Z79899 Other long term (current) drug therapy: Secondary | ICD-10-CM | POA: Insufficient documentation

## 2013-08-14 DIAGNOSIS — F411 Generalized anxiety disorder: Secondary | ICD-10-CM | POA: Insufficient documentation

## 2013-08-14 DIAGNOSIS — K219 Gastro-esophageal reflux disease without esophagitis: Secondary | ICD-10-CM | POA: Insufficient documentation

## 2013-08-14 DIAGNOSIS — F17200 Nicotine dependence, unspecified, uncomplicated: Secondary | ICD-10-CM | POA: Insufficient documentation

## 2013-08-14 DIAGNOSIS — Z7982 Long term (current) use of aspirin: Secondary | ICD-10-CM | POA: Insufficient documentation

## 2013-08-14 LAB — BASIC METABOLIC PANEL
BUN: 11 mg/dL (ref 6–23)
CHLORIDE: 100 meq/L (ref 96–112)
CO2: 26 mEq/L (ref 19–32)
CREATININE: 0.72 mg/dL (ref 0.50–1.10)
Calcium: 9 mg/dL (ref 8.4–10.5)
GFR calc Af Amer: >90 mL/min (ref >90)
GFR calc non Af Amer: >90 mL/min (ref >90)
GLUCOSE: 112 mg/dL — AB (ref 70–99)
Potassium: 3.9 mEq/L (ref 3.7–5.3)
Sodium: 138 mEq/L (ref 137–147)

## 2013-08-14 LAB — CK: Total CK: 33 U/L (ref 7–177)

## 2013-08-14 MED ORDER — HYDROCODONE-ACETAMINOPHEN 5-325 MG PO TABS: 2.0000 | ORAL_TABLET | ORAL | Status: DC | PRN

## 2013-08-14 MED ORDER — OXYCODONE-ACETAMINOPHEN 5-325 MG PO TABS
2.0000 | ORAL_TABLET | Freq: Once | ORAL | Status: AC
  Administered 2013-08-14: 2 via ORAL
  Filled 2013-08-14: qty 2

## 2013-08-14 MED ORDER — NAPROXEN 500 MG PO TABS
500.0000 mg | ORAL_TABLET | Freq: Two times a day (BID) | ORAL | Status: DC
Start: 2013-08-14 — End: 2013-11-05

## 2013-08-14 MED ORDER — METHOCARBAMOL 500 MG PO TABS: 500.0000 mg | ORAL_TABLET | Freq: Two times a day (BID) | ORAL | Status: DC | PRN

## 2013-08-14 MED ORDER — METHOCARBAMOL 500 MG PO TABS
750.0000 mg | ORAL_TABLET | Freq: Once | ORAL | Status: AC
  Administered 2013-08-14: 750 mg via ORAL
  Filled 2013-08-14: qty 2

## 2013-08-14 NOTE — Discharge Instructions (Signed)
Your blood work and xray are normal - use the sling for comfort but you should only use this for a couple of days because if you use it to long, you may develop a "frozen shoulder".  Naprosyn twice daily, hydrocodone with tylenol for severe pain and robaxin for a muscle relaxer.    Acetaminophen; Oxycodone tablets or PERCOCET  This is a pain reliever. It is used to treat mild to moderate pain.  This medicine may be used for other purposes; ask your health care provider or pharmacist if you have questions.  What should I tell my health care provider before I take this medicine?  They need to know if you have any of these conditions:  -brain tumor  -Crohn's disease, inflammatory bowel disease, or ulcerative colitis  -drink more than 3 alcohol containing drinks per day  -drug abuse or addiction  -head injury  -heart or circulation problems  -kidney disease or problems going to the bathroom  -liver disease  -lung disease, asthma, or breathing problems  -an unusual or allergic reaction to acetaminophen, oxycodone, other opioid analgesics, other medicines, foods, dyes, or preservatives  -pregnant or trying to get pregnant  -breast-feeding  How should I use this medicine?  Take this medicine by mouth with a full glass of water. Follow the directions on the prescription label. Take your medicine at regular intervals. Do not take your medicine more often than directed.  Talk to your pediatrician regarding the use of this medicine in children. Special care may be needed.  Patients over 80 years old may have a stronger reaction and need a smaller dose.  Overdosage: If you think you have taken too much of this medicine contact a poison control center or emergency room at once.  NOTE: This medicine is only for you. Do not share this medicine with others.   What may interact with this medicine?  -alcohol or medicines that contain alcohol  -antihistamines  -barbiturates like amobarbital, butalbital,  butabarbital, methohexital, pentobarbital, phenobarbital, thiopental, and secobarbital  -benztropine  -drugs for bladder problems like solifenacin, trospium, oxybutynin, tolterodine, hyoscyamine, and methscopolamine  -drugs for breathing problems like ipratropium and tiotropium  -drugs for certain stomach or intestine problems like propantheline, homatropine methylbromide, glycopyrrolate, atropine, belladonna, and dicyclomine  -general anesthetics like etomidate, ketamine, nitrous oxide, propofol, desflurane, enflurane, halothane, isoflurane, and sevoflurane  -medicines for depression, anxiety, or psychotic disturbances  -medicines for pain like codeine, morphine, pentazocine, buprenorphine, butorphanol, nalbuphine, tramadol, and propoxyphene  -medicines for sleep  -muscle relaxants  -naltrexone  -phenothiazines like perphenazine, thioridazine, chlorpromazine, mesoridazine, fluphenazine, prochlorperazine, promazine, and trifluoperazine  -scopolamine  -trihexyphenidyl  This list may not describe all possible interactions. Give your health care provider a list of all the medicines, herbs, non-prescription drugs, or dietary supplements you use. Also tell them if you smoke, drink alcohol, or use illegal drugs. Some items may interact with your medicine.  What should I watch for while using this medicine?  Tell your doctor or health care professional if your pain does not go away, if it gets worse, or if you have new or a different type of pain Do not suddenly stop taking your medicine because you may develop a severe reaction. Your body becomes used to the medicine. This does NOT mean you are addicted. Addiction is a behavior related to getting and using a drug for a nonmedical reason.  You may get drowsy or dizzy. Do not drive, use machinery, or do anything that needs mental alertness until you  know how this medicine affects you. Do not stand or sit up quickly, especially if you are an older patient.  This reduces the risk of dizzy or fainting spells. Alcohol may interfere with the effect of this medicine. Avoid alcoholic drinks.  The medicine will cause constipation. Try to have a bowel movement at least every 2 to 3 days. If you do not have a bowel movement for 3 days, call your doctor or health care professional.  Do not take Tylenol (acetaminophen) or medicines that have acetaminophen with this medicine. Too much acetaminophen can be very dangerous. Many nonprescription medicines contain acetaminophen. Always read the labels carefully to avoid taking more acetaminophen.   What side effects may I notice from receiving this medicine?  Side effects that you should report to your doctor or health care professional as soon as possible:  -allergic reactions like skin rash, itching or hives, swelling of the face, lips, or tongue  -breathing difficulties, wheezing  -confusion  -light headedness or fainting spells  -severe stomach pain  -yellowing of the skin or the whites of the eyes  Side effects that usually do not require medical attention (report to your doctor or health care professional if they continue or are bothersome):  -dizziness  -drowsiness  -nausea  -vomiting  This list may not describe all possible side effects. Call your doctor for medical advice about side effects. You may report side effects to FDA at 1-800-FDA-1088.  Where should I keep my medicine?  Keep out of the reach of children. This medicine can be abused. Keep your medicine in a safe place to protect it from theft. Do not share this medicine with anyone. Selling or giving away this medicine is dangerous and against the law.   NOTE: This sheet is a summary. It may not cover all possible information. If you have questions about this medicine, talk to your doctor, pharmacist, or health care provider.

## 2013-08-14 NOTE — ED Notes (Signed)
Arm sling placed. Instructed patient how to loosen or tighten straps for support.

## 2013-08-14 NOTE — ED Provider Notes (Signed)
CSN: [id number]     Arrival date & time 08/14/13  0508 History   First MD Initiated Contact with Patient 08/14/13 [phone]     Chief Complaint  Patient presents with  . Arm Pain     (Consider location/radiation/quality/duration/timing/severity/associated sxs/prior Treatment) HPI Comments: Atraumatic pain to the L shoulder and arm which started yesterday - was gradual in onset and has now spread down to the elbow and  Up the L neck - occasionally onto the L chest wall.  It is much worse with moving the arm, touching the arm or any ROM excercises.  She has hx of CHF and COPD - she denies SOB.  This started when she was walking with her grandkids yesterday.  Non exertional.  No hx of obstructive CAD per pt.  Patient is a 53 y.o. female presenting with arm pain. The history is provided by the patient.  Arm Pain This is a new problem. The current episode started yesterday. The problem occurs constantly. The problem has been gradually worsening. Associated symptoms include chest pain ( mild upper left chest pain with moving the L arm). Pertinent negatives include no abdominal pain and no shortness of breath. Exacerbated by: palpation of arm at the shoulder, trapezius, lat's and chest wall on the L. worse with ROM of the L shoulder and elbow. Relieved by: holding still at the side. She has tried nothing (EMS gave nitro pta without any improvement) for the symptoms.    Past Medical History  Diagnosis Date  . Hypertension     09/2010-normal CMet and CBC; Lipid profile-116, 88, 25, 73  . Cardiomyopathy 08/2010    Presented with congestive heart failure; EF of 15% and 2012; hypotension on medication precludes optimal dosing  . Gastroesophageal reflux disease   . Pneumonia   . Obesity   . Hyperlipidemia   . Tobacco abuse     20 pack years  . CHF (congestive heart failure)   . Asthma   . Shortness of breath   . Headache(784.0)   . Arthritis   . Anxiety   . Neuropathy   . 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 Chronic systolic heart failure   . Dysrhythmia   . Automatic implantable cardioverter-defibrillator in situ     2013, july  . COPD (chronic obstructive pulmonary disease)     2013  . Bell palsy     states has had 3 episodes  . Stroke      stroke in 07/2012 and another in June, 2014  . Seizures     last one at age 16  . Neuromuscular disorder     Neuropathy, right foot and leg  . Hypothyroidism     Recent TSH was normal.   Past Surgical History  Procedure Laterality Date  . Cesarean section      X2  . Tee without cardioversion N/A 07/11/2012    Procedure: TRANSESOPHAGEAL ECHOCARDIOGRAM (TEE);  Surgeon: Vesta Mixer, MD;  Location: [facility] ENDOSCOPY;  Service: Cardiovascular;  Laterality: N/A;  . Tubal ligation    . Multiple extractions with alveoloplasty N/A 09/24/2012    Procedure: MULTIPLE EXTRACION #2, 4, 6, 7 ,8, 9, 11, 13, 18, 20, 21, 22, 23, 24, 25, 26, 27, 29 WITH ALVEOLOPLASTY, BIOPSY OF PALATE LESION, REMOVA RIGHT LINGUAL TORUS;  Surgeon: Georgia Lopes, DDS;  Location: [facility] OR;  Service: Oral Surgery;  Laterality: N/A;   Family History  Problem Relation Age of Onset  . Cardiomyopathy Mother  ICD pacemaker-ischmic CM  . Heart failure Mother   . Hypertension Mother   . Cardiomyopathy Father     Deceased  . Coronary artery disease Father   . Heart failure Father   . Heart failure Brother   . Hypertension Brother    History  Substance Use Topics  . Smoking status: Current Every Day Smoker -- 0.25 packs/day for 21 years    Types: Cigarettes  . Smokeless tobacco: Never Used     Comment: 5 ciggs daily 11-26-12  . Alcohol Use: No   OB History   Grav Para Term Preterm Abortions TAB SAB Ect Mult Living                 Review of Systems  Respiratory: Negative for shortness of breath.   Cardiovascular: Positive for chest pain ( mild upper left chest pain with moving the L arm).  Gastrointestinal: Negative for abdominal pain.  All  other systems reviewed and are negative.     Allergies  Fish allergy; Iodinated diagnostic agents; Iodine; Lentil; Penicillins; Lipitor; Spironolactone; and Sulfa antibiotics  Home Medications   Prior to Admission medications   Medication Sig Start Date End Date Taking? Authorizing Provider  acetaminophen (TYLENOL) 325 MG tablet Take 2 tablets (650 mg total) by mouth every 4 (four) hours as needed for mild pain (temperature >/= 99.5 F). 04/30/13   Kinnie Feil, MD  albuterol (PROVENTIL HFA;VENTOLIN HFA) 108 (90 BASE) MCG/ACT inhaler Inhale 2 puffs into the lungs every 6 (six) hours as needed for wheezing or shortness of breath. 07/12/12   Tonia Brooms, MD  aspirin 325 MG tablet Take 1 tablet (325 mg total) by mouth daily. 07/12/12   Tonia Brooms, MD  aspirin EC 81 MG tablet Take 81 mg by mouth daily.    Historical Provider, MD  budesonide-formoterol (SYMBICORT) 80-4.5 MCG/ACT inhaler Inhale 2 puffs into the lungs 2 (two) times daily.    Historical Provider, MD  carvedilol (COREG) 25 MG tablet Take 0.5 tablets (12.5 mg total) by mouth 2 (two) times daily with a meal. 10/30/12   Dot Lanes, MD  desloratadine (CLARINEX) 5 MG tablet Take 5 mg by mouth daily.    Historical Provider, MD  diclofenac sodium (VOLTAREN) 1 % GEL Apply 2 g topically 4 (four) times daily as needed (pain).     Historical Provider, MD  diphenhydrAMINE (BENADRYL) 25 MG tablet Take 25 mg by mouth every 6 (six) hours as needed.    Historical Provider, MD  ferrous sulfate (IRON SUPPLEMENT) 325 (65 FE) MG tablet Take 325 mg by mouth daily with breakfast.    Historical Provider, MD  furosemide (LASIX) 40 MG tablet Take 1 tablet (40 mg total) by mouth 2 (two) times daily. 08/29/12   Yehuda Savannah, MD  gabapentin (NEURONTIN) 300 MG capsule Take 300 mg by mouth at bedtime.  02/14/12   Yehuda Savannah, MD  HYDROcodone-acetaminophen (NORCO/VICODIN) 5-325 MG per tablet Take 2 tablets by mouth every 4 (four) hours as needed.  08/14/13   Johnna Acosta, MD  Levothyroxine Sodium 25 MCG CAPS Take 200 mcg by mouth daily before breakfast.     Historical Provider, MD  lisinopril (PRINIVIL,ZESTRIL) 10 MG tablet Take 1 tablet (10 mg total) by mouth daily. 08/29/12   Yehuda Savannah, MD  loratadine (LORATADINE ALLERGY RELIEF) 10 MG dissolvable tablet Take 10 mg by mouth daily.    Historical Provider, MD  lovastatin (MEVACOR) 40 MG tablet Take 1 tablet (40 mg  total) by mouth at bedtime. 08/29/12   Yehuda Savannah, MD  methocarbamol (ROBAXIN) 500 MG tablet Take 1 tablet (500 mg total) by mouth 2 (two) times daily as needed for muscle spasms. 08/14/13   Johnna Acosta, MD  naproxen (NAPROSYN) 500 MG tablet Take 1 tablet (500 mg total) by mouth 2 (two) times daily with a meal. 08/14/13   Johnna Acosta, MD  Naproxen Sodium (ALEVE PO) Take 2 tablets by mouth at bedtime as needed (for pain).    Historical Provider, MD  oxymetazoline (AFRIN) 0.05 % nasal spray Place 2 sprays into the nose daily as needed for congestion.    Historical Provider, MD  potassium chloride SA (K-DUR,KLOR-CON) 20 MEQ tablet Take 2 tablets (40 mEq total) by mouth 2 (two) times daily. 08/29/12   Yehuda Savannah, MD  ranitidine (ZANTAC) 150 MG tablet Take 150 mg by mouth 2 (two) times daily.     Historical Provider, MD  tiotropium (SPIRIVA) 18 MCG inhalation capsule Place 18 mcg into inhaler and inhale daily.    Historical Provider, MD   BP 119/91  Pulse 78  Temp(Src) 97.8 F (36.6 C) (Oral)  Resp 20  Ht 5' 7.5" (1.715 m)  Wt 285 lb (129.275 kg)  BMI 43.95 kg/m2  SpO2 99% Physical Exam  Nursing note and vitals reviewed. Constitutional: She appears well-developed and well-nourished. No distress.  HENT:  Head: Normocephalic and atraumatic.  Mouth/Throat: Oropharynx is clear and moist. No oropharyngeal exudate.  Eyes: Conjunctivae and EOM are normal. Pupils are equal, round, and reactive to light. Right eye exhibits no discharge. Left eye exhibits no  discharge. No scleral icterus.  Neck: Normal range of motion. Neck supple. No JVD present. No thyromegaly present.  Cardiovascular: Normal rate, regular rhythm, normal heart sounds and intact distal pulses.  Exam reveals no gallop and no friction rub.   No murmur heard. Pulmonary/Chest: Effort normal and breath sounds normal. No respiratory distress. She has no wheezes. She has no rales. She exhibits tenderness ( reproducible chest ttp in the L upper chest.).  Musculoskeletal: Normal range of motion. She exhibits tenderness ( ttp in the shoulder, trapezius, deltoid and worse wtih ROM.  no redness or warmth to skin or joint. ). She exhibits no edema.  Normal ROM of all other joints including the L elbow and wrist.  Normal grips and normal strength of LE's bilaterally  Lymphadenopathy:    She has no cervical adenopathy.  Neurological: She is alert. Coordination normal.  Normal strength and sensation to all 4 extremities including the L arm - no facial droop.   Skin: Skin is warm and dry. No rash noted. No erythema.  Psychiatric: She has a normal mood and affect. Her behavior is normal.    ED Course  Procedures (including critical care time) Labs Review Labs Reviewed  BASIC METABOLIC PANEL - Abnormal; Notable for the following:    Glucose, Bld 112 (*)    All other components within normal limits  CK    Imaging Review Dg Shoulder Left  08/14/2013   CLINICAL DATA:  Atraumatic left shoulder pain.  EXAM: LEFT SHOULDER - 2+ VIEW  COMPARISON:  None.  FINDINGS: There is no evidence of fracture or dislocation. There is no evidence of arthropathy or other focal bone abnormality. Pacer generator pack noted in the left chest.  IMPRESSION: Negative.   Electronically Signed   By: Jorje Guild M.D.   On: 08/14/2013 06:26     EKG Interpretation  Date/Time:  Wednesday August 14 2013 05:35:01 EDT Ventricular Rate:  90 PR Interval:  148 QRS Duration: 104 QT Interval:  401 QTC Calculation: 491 R  Axis:   -25 Text Interpretation:  Sinus rhythm Ventricular trigeminy Borderline left  axis deviation Low voltage, precordial leads Borderline repolarization  abnormality Borderline prolonged QT interval since last tracing no  significant change Confirmed by Alfred Eckley  MD, Homer Porschea Borys ([id number]) on 08/14/2013  5:53:02 AM      MDM   Final diagnoses:  Muscle spasm    Pain, no weakness or numbness.  The pt resists any movement of the shoulder - no hx of Gout, no warmth or redness to the joint and diffuse ttp over the muscle girdle of the shoulder, upper bicep and trap.  No trismus or torticollis and no abnormal cardiac or pulmonary findings.  Nitro didn't help enrote - appears MSK in origin.  Check CK, image shoulder   Labs normal, xray without acute shoulder findings - sling placed, pt improved with opiate and robaxin - cautioned to use arm and stretch m uscles - f/u with PMD - sling offered.  Meds given in ED:  Medications  oxyCODONE-acetaminophen (PERCOCET/ROXICET) 5-325 MG per tablet 2 tablet (2 tablets Oral Given 08/14/13 0526)  methocarbamol (ROBAXIN) tablet 750 mg (750 mg Oral Given 08/14/13 0526)    New Prescriptions   HYDROCODONE-ACETAMINOPHEN (NORCO/VICODIN) 5-325 MG PER TABLET    Take 2 tablets by mouth every 4 (four) hours as needed.   METHOCARBAMOL (ROBAXIN) 500 MG TABLET    Take 1 tablet (500 mg total) by mouth 2 (two) times daily as needed for muscle spasms.   NAPROXEN (NAPROSYN) 500 MG TABLET    Take 1 tablet (500 mg total) by mouth 2 (two) times daily with a meal.      Johnna Acosta, MD 08/14/13 [phone]

## 2013-08-14 NOTE — ED Notes (Signed)
Pt reporting CP since yesterday.  Reports pain in left shoulder and left arm.  Reports pain as sharp ache.  Pain has been constant.  324 ASA given aprox 30 min ago, 1 NTG given with no relief.

## 2013-08-15 ENCOUNTER — Ambulatory Visit (INDEPENDENT_AMBULATORY_CARE_PROVIDER_SITE_OTHER): Payer: Medicaid Other | Admitting: *Deleted

## 2013-08-15 DIAGNOSIS — I5022 Chronic systolic (congestive) heart failure: Secondary | ICD-10-CM

## 2013-08-15 DIAGNOSIS — I429 Cardiomyopathy, unspecified: Secondary | ICD-10-CM

## 2013-08-15 DIAGNOSIS — I428 Other cardiomyopathies: Secondary | ICD-10-CM

## 2013-08-15 DIAGNOSIS — Z95810 Presence of automatic (implantable) cardiac defibrillator: Secondary | ICD-10-CM

## 2013-08-15 LAB — MDC_IDC_ENUM_SESS_TYPE_REMOTE
Battery Voltage: 2.98 V
Brady Statistic RV Percent Paced: 1 %
Date Time Interrogation Session: 20150604080019
HIGH POWER IMPEDANCE MEASURED VALUE: 80 Ohm
HighPow Impedance: 80 Ohm
Implantable Pulse Generator Serial Number: 1022442
Lead Channel Impedance Value: 560 Ohm
Lead Channel Pacing Threshold Amplitude: 0.75 V
Lead Channel Pacing Threshold Pulse Width: 0.5 ms
Lead Channel Sensing Intrinsic Amplitude: 12 mV
Lead Channel Setting Pacing Amplitude: 2.5 V
Lead Channel Setting Pacing Pulse Width: 0.5 ms
MDC IDC MSMT BATTERY REMAINING LONGEVITY: 84 mo
MDC IDC MSMT BATTERY REMAINING PERCENTAGE: 80 %
MDC IDC SET LEADCHNL RV SENSING SENSITIVITY: 0.5 mV
Zone Setting Detection Interval: 300 ms

## 2013-08-16 NOTE — Progress Notes (Signed)
Remote ICD transmission.   

## 2013-08-23 ENCOUNTER — Encounter: Payer: Self-pay | Admitting: Cardiology

## 2013-08-27 ENCOUNTER — Encounter: Payer: Self-pay | Admitting: Internal Medicine

## 2013-09-10 ENCOUNTER — Other Ambulatory Visit: Payer: Self-pay | Admitting: Neurology

## 2013-09-10 ENCOUNTER — Ambulatory Visit (HOSPITAL_COMMUNITY)
Admission: RE | Admit: 2013-09-10 | Discharge: 2013-09-10 | Disposition: A | Discharge status: Home / Self Care | Payer: Medicaid Other | Source: Ambulatory Visit | Attending: Neurology | Admitting: Neurology

## 2013-09-10 DIAGNOSIS — IMO0002 Reserved for concepts with insufficient information to code with codable children: Secondary | ICD-10-CM

## 2013-09-10 DIAGNOSIS — I7090 Unspecified atherosclerosis: Secondary | ICD-10-CM | POA: Insufficient documentation

## 2013-09-10 DIAGNOSIS — M47817 Spondylosis without myelopathy or radiculopathy, lumbosacral region: Secondary | ICD-10-CM | POA: Insufficient documentation

## 2013-09-24 ENCOUNTER — Other Ambulatory Visit (HOSPITAL_COMMUNITY)
Admission: RE | Admit: 2013-09-24 | Discharge: 2013-09-24 | Disposition: A | Discharge status: Home / Self Care | Payer: Medicaid Other | Source: Ambulatory Visit | Attending: Obstetrics & Gynecology | Admitting: Obstetrics & Gynecology

## 2013-09-24 ENCOUNTER — Other Ambulatory Visit (HOSPITAL_COMMUNITY): Payer: Self-pay | Admitting: Internal Medicine

## 2013-09-24 ENCOUNTER — Ambulatory Visit (INDEPENDENT_AMBULATORY_CARE_PROVIDER_SITE_OTHER): Payer: Medicaid Other | Admitting: Obstetrics & Gynecology

## 2013-09-24 ENCOUNTER — Telehealth: Payer: Self-pay | Admitting: *Deleted

## 2013-09-24 ENCOUNTER — Encounter: Payer: Self-pay | Admitting: Obstetrics & Gynecology

## 2013-09-24 VITALS — BP 118/64 | Ht 67.0 in | Wt 290.0 lb

## 2013-09-24 DIAGNOSIS — Z124 Encounter for screening for malignant neoplasm of cervix: Secondary | ICD-10-CM | POA: Diagnosis not present

## 2013-09-24 DIAGNOSIS — Z1151 Encounter for screening for human papillomavirus (HPV): Secondary | ICD-10-CM | POA: Insufficient documentation

## 2013-09-24 DIAGNOSIS — Z1231 Encounter for screening mammogram for malignant neoplasm of breast: Secondary | ICD-10-CM

## 2013-09-24 DIAGNOSIS — Z1212 Encounter for screening for malignant neoplasm of rectum: Secondary | ICD-10-CM

## 2013-09-24 DIAGNOSIS — Z01419 Encounter for gynecological examination (general) (routine) without abnormal findings: Secondary | ICD-10-CM

## 2013-09-24 DIAGNOSIS — Z0000 Encounter for general adult medical examination without abnormal findings: Secondary | ICD-10-CM

## 2013-09-24 NOTE — Telephone Encounter (Signed)
I called pt and she has seen blood in her stool. OV with Neil Crouch, PA on 11/05/2013 at 9:00 AM.

## 2013-09-24 NOTE — Progress Notes (Signed)
Patient ID: Melanie Cordova, female   DOB: 1960-06-21, 53 y.o.   MRN: [id number] Subjective:     Melanie Cordova is a 53 y.o. female here for a routine exam.  No LMP recorded. Patient is postmenopausal. No obstetric history on file. Birth Control Method:  None Menstrual Calendar(currently): postmenopause (last 2 years ago)  Current complaints: concern for yeast infection, light white discharge x 2 months; no itchiness or discomfort, no bleeding.   Current acute medical issues:  none   Recent Gynecologic History No LMP recorded. Patient is postmenopausal. Last Pap: 05/2012,  [place] Last mammogram: 4 years 2011,  normal  Past Medical History  Diagnosis Date  . Hypertension     09/2010-normal CMet and CBC; Lipid profile-116, 88, 25, 73  . Cardiomyopathy 08/2010    Presented with congestive heart failure; EF of 15% and 2012; hypotension on medication precludes optimal dosing  . Gastroesophageal reflux disease   . Pneumonia   . Obesity   . Hyperlipidemia   . Tobacco abuse     20 pack years  . CHF (congestive heart failure)   . Asthma   . Shortness of breath   . Headache(784.0)   . Arthritis   . Anxiety   . Neuropathy   . 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 Chronic systolic heart failure   . Dysrhythmia   . Automatic implantable cardioverter-defibrillator in situ     2013, july  . COPD (chronic obstructive pulmonary disease)     2013  . Bell palsy     states has had 3 episodes  . Stroke      stroke in 07/2012 and another in June, 2014  . Seizures     last one at age 19  . Neuromuscular disorder     Neuropathy, right foot and leg  . Hypothyroidism     Recent TSH was normal.    Past Surgical History  Procedure Laterality Date  . Cesarean section      X2  . Tee without cardioversion N/A 07/11/2012    Procedure: TRANSESOPHAGEAL ECHOCARDIOGRAM (TEE);  Surgeon: Thayer Headings, MD;  Location: [facility];  Service: Cardiovascular;  Laterality:  N/A;  . Tubal ligation    . Multiple extractions with alveoloplasty N/A 09/24/2012    Procedure: MULTIPLE EXTRACION #2, 4, 6, 7 ,8, 9, 11, 13, 18, 20, 21, 22, 23, 24, 25, 26, 27, 29 WITH ALVEOLOPLASTY, BIOPSY OF PALATE LESION, REMOVA RIGHT LINGUAL TORUS;  Surgeon: Gae Bon, DDS;  Location: [facility];  Service: Oral Surgery;  Laterality: N/A;    OB History   Grav Para Term Preterm Abortions TAB SAB Ect Mult Living                  History   Social History  . Marital Status: Divorced    Spouse Name: N/A    Number of Children: 2  . Years of Education: N/A   Occupational History  .      Works in Environmental consultant   Social History Main Topics  . Smoking status: Current Every Day Smoker -- 0.25 packs/day for 21 years    Types: Cigarettes  . Smokeless tobacco: Never Used     Comment: 5 ciggs daily 11-26-12  . Alcohol Use: No  . Drug Use: No  . Sexual Activity: No   Other Topics Concern  . None   Social History Narrative  . None    Family History  Problem Relation Age  of Onset  . Cardiomyopathy Mother     ICD pacemaker-ischmic CM  . Heart failure Mother   . Hypertension Mother   . Diabetes Mother   . Thyroid disease Mother   . Cardiomyopathy Father     Deceased  . Coronary artery disease Father   . Heart failure Father   . Heart failure Brother   . Hypertension Brother   . Endometriosis Sister      Review of Systems  Review of Systems  Constitutional: Negative for fever, chills, weight loss, malaise/fatigue and diaphoresis.  HENT: Negative for hearing loss, ear pain, nosebleeds, congestion, sore throat, neck pain, tinnitus and ear discharge.   Eyes: Negative for blurred vision, double vision, photophobia, pain, discharge and redness.  Respiratory: Negative for cough, hemoptysis, sputum production, shortness of breath, wheezing and stridor.   Cardiovascular: Negative for chest pain, palpitations, orthopnea, claudication, leg swelling and PND.   Gastrointestinal: negative for abdominal pain. Negative for heartburn, nausea, vomiting, diarrhea, constipation, blood in stool and melena.  Genitourinary: Negative for dysuria, urgency, frequency, hematuria and flank pain.  Musculoskeletal: Negative for myalgias, back pain, joint pain and falls.  Skin: Negative for itching and rash.  Neurological: Negative for dizziness, tingling, tremors, sensory change, speech change, focal weakness, seizures, loss of consciousness, weakness and headaches.  Endo/Heme/Allergies: Negative for environmental allergies and polydipsia. Does not bruise/bleed easily.  Psychiatric/Behavioral: Negative for depression, suicidal ideas, hallucinations, memory loss and substance abuse. The patient is not nervous/anxious and does not have insomnia.        Objective:    Physical Exam  Vitals reviewed. Constitutional: She is oriented to person, place, and time. She appears well-developed and well-nourished.  HENT:  Head: Normocephalic and atraumatic.        Right Ear: External ear normal.  Left Ear: External ear normal.  Nose: Nose normal.  Mouth/Throat: Oropharynx is clear and moist.  Eyes: Conjunctivae and EOM are normal. Pupils are equal, [place], and reactive to light. Right eye exhibits no discharge. Left eye exhibits no discharge. No scleral icterus.  Neck: Normal range of motion. Neck supple. No tracheal deviation present. No thyromegaly present.  Cardiovascular: Normal rate, regular rhythm, normal heart sounds and intact distal pulses.  Exam reveals no gallop and no friction rub.   No murmur heard. Respiratory: Effort normal and breath sounds normal. No respiratory distress. She has no wheezes. She has no rales. She exhibits no tenderness.  GI: Soft. Bowel sounds are normal. She exhibits no distension and no mass. There is no tenderness. There is no rebound and no guarding.  Genitourinary:  Breasts no masses skin changes or nipple changes bilaterally       Vulva is normal without lesions Vagina is pink moist without discharge Cervix normal in appearance and pap is normal Uterus is normal size shape and contour Adnexa is negative with normal sized ovaries  Rectal   hemoccult negative, normal tone, no masses  Musculoskeletal: Normal range of motion. She exhibits no edema and no tenderness.  Neurological: She is alert and oriented to person, place, and time. She has normal reflexes. She displays normal reflexes. No cranial nerve deficit. She exhibits normal muscle tone. Coordination normal.  Skin: Skin is warm and dry. No rash noted. No erythema. No pallor.  Psychiatric: She has a normal mood and affect. Her behavior is normal. Judgment and thought content normal.       Assessment:    Healthy female exam.    Plan:    Mammogram ordered.  Follow up in: 1 year.

## 2013-09-24 NOTE — Patient Instructions (Signed)
Mammogram  [phone]

## 2013-09-24 NOTE — Telephone Encounter (Signed)
Pt called wanting to get scheduled for a colonoscopy. Please advise [phone]

## 2013-09-25 LAB — CYTOLOGY - PAP

## 2013-10-03 ENCOUNTER — Ambulatory Visit (HOSPITAL_COMMUNITY)
Admission: RE | Admit: 2013-10-03 | Discharge: 2013-10-03 | Disposition: A | Discharge status: Home / Self Care | Payer: Medicaid Other | Source: Ambulatory Visit | Attending: Internal Medicine | Admitting: Internal Medicine

## 2013-10-03 DIAGNOSIS — Z1231 Encounter for screening mammogram for malignant neoplasm of breast: Secondary | ICD-10-CM | POA: Diagnosis not present

## 2013-11-05 ENCOUNTER — Encounter: Payer: Self-pay | Admitting: Gastroenterology

## 2013-11-05 ENCOUNTER — Encounter (HOSPITAL_COMMUNITY): Payer: Self-pay | Admitting: Pharmacy Technician

## 2013-11-05 ENCOUNTER — Ambulatory Visit (INDEPENDENT_AMBULATORY_CARE_PROVIDER_SITE_OTHER): Payer: Medicaid Other | Admitting: Gastroenterology

## 2013-11-05 VITALS — BP 112/74 | HR 80 | Temp 98.4°F | Ht 67.5 in | Wt 287.6 lb

## 2013-11-05 DIAGNOSIS — K625 Hemorrhage of anus and rectum: Secondary | ICD-10-CM

## 2013-11-05 MED ORDER — PEG-KCL-NACL-NASULF-NA ASC-C 100 G PO SOLR: 1.0000 | ORAL | Status: DC

## 2013-11-05 NOTE — Progress Notes (Signed)
Primary Care Physician:  Rosita Fire, MD  Primary Gastroenterologist:  Barney Drain, MD   Chief Complaint  Patient presents with  . Colonoscopy  . Blood In Stools    sometimes    HPI:  Melanie Cordova is a 53 y.o. female here for further evaluation of blood in the stool at the request of Dr. Legrand Rams. BM 3 per day. No loose stools. Blood in stool and on the tissue intermittent for few months. H/O IBS in the past but doing well, no longer on medication. No heartburn on zantac. No rectal pain or abdominal pain. No dysphagia. No N/V. On protein and heart healthy diet. Frustrated in inability to lose weight. Synthroid dose adjusted several times lately, followed now by Dr. [name].  Current Outpatient Prescriptions  Medication Sig Dispense Refill  . albuterol (PROVENTIL HFA;VENTOLIN HFA) 108 (90 BASE) MCG/ACT inhaler Inhale 2 puffs into the lungs every 6 (six) hours as needed for wheezing or shortness of breath.  1 Inhaler  5  . aspirin EC 81 MG tablet Take 81 mg by mouth daily.      . budesonide-formoterol (SYMBICORT) 80-4.5 MCG/ACT inhaler Inhale 2 puffs into the lungs 2 (two) times daily.      . carvedilol (COREG) 25 MG tablet Take 0.5 tablets (12.5 mg total) by mouth 2 (two) times daily with a meal.  180 tablet  0  . desloratadine (CLARINEX) 5 MG tablet Take 5 mg by mouth daily.      . diclofenac sodium (VOLTAREN) 1 % GEL Apply 2 g topically 4 (four) times daily as needed (pain).       Marland Kitchen diphenhydrAMINE (BENADRYL) 25 MG tablet Take 25 mg by mouth every 6 (six) hours as needed.      . ferrous sulfate (IRON SUPPLEMENT) 325 (65 FE) MG tablet Take 325 mg by mouth daily with breakfast.      . furosemide (LASIX) 40 MG tablet Take 1 tablet (40 mg total) by mouth 2 (two) times daily.  180 tablet  0  . gabapentin (NEURONTIN) 300 MG capsule Take 300 mg by mouth at bedtime.       . Levothyroxine Sodium 25 MCG CAPS Take 225 mcg by mouth daily before breakfast.       . lisinopril (PRINIVIL,ZESTRIL) 10  MG tablet Take 1 tablet (10 mg total) by mouth daily.  90 tablet  0  . lovastatin (MEVACOR) 40 MG tablet Take 1 tablet (40 mg total) by mouth at bedtime.  90 tablet  0  . Naproxen Sodium (ALEVE PO) Take 2 tablets by mouth at bedtime as needed (for pain).      Marland Kitchen oxymetazoline (AFRIN) 0.05 % nasal spray Place 2 sprays into the nose daily as needed for congestion.      . potassium chloride SA (K-DUR,KLOR-CON) 20 MEQ tablet Take 2 tablets (40 mEq total) by mouth 2 (two) times daily.  180 tablet  0  . ranitidine (ZANTAC) 150 MG tablet Take 150 mg by mouth 2 (two) times daily.       Marland Kitchen tiotropium (SPIRIVA) 18 MCG inhalation capsule Place 18 mcg into inhaler and inhale daily.       No current facility-administered medications for this visit.    Allergies as of 11/05/2013 - Review Complete 11/05/2013  Allergen Reaction Noted  . Fish allergy Anaphylaxis 03/20/2012  . Iodinated diagnostic agents Hives and Other (See Comments) 09/24/2010  . Iodine Hives and Other (See Comments) 12/23/2010  . Lentil Anaphylaxis 03/20/2012  . Penicillins Anaphylaxis and  Shortness Of Breath 05/04/2010  . Lipitor [atorvastatin] Itching and Rash 07/27/2012  . Spironolactone Rash 07/11/2011  . Sulfa antibiotics Other (See Comments) 05/04/2010    Past Medical History  Diagnosis Date  . Hypertension     09/2010-normal CMet and CBC; Lipid profile-116, 88, 25, 73  . Cardiomyopathy 08/2010    Presented with congestive heart failure; EF of 15% and 2012; hypotension on medication precludes optimal dosing  . Gastroesophageal reflux disease   . Pneumonia   . Obesity   . Hyperlipidemia   . Tobacco abuse     20 pack years  . CHF (congestive heart failure)   . Asthma   . Shortness of breath   . Headache(784.0)   . Arthritis   . Anxiety   . Neuropathy   . 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 Chronic systolic heart failure   . Dysrhythmia   . Automatic implantable cardioverter-defibrillator in  situ     2013, july  . COPD (chronic obstructive pulmonary disease)     2013  . Bell palsy     states has had 3 episodes  . Stroke      stroke in 07/2012 and another in June, 2014  . Seizures     last one at age 63  . Neuromuscular disorder     Neuropathy, right foot and leg  . Hypothyroidism     Recent TSH was normal.    Past Surgical History  Procedure Laterality Date  . Cesarean section      X2  . Tee without cardioversion N/A 07/11/2012    Procedure: TRANSESOPHAGEAL ECHOCARDIOGRAM (TEE);  Surgeon: Thayer Headings, MD;  Location: [facility];  Service: Cardiovascular;  Laterality: N/A;  . Tubal ligation    . Multiple extractions with alveoloplasty N/A 09/24/2012    Procedure: MULTIPLE EXTRACION #2, 4, 6, 7 ,8, 9, 11, 13, 18, 20, 21, 22, 23, 24, 25, 26, 27, 29 WITH ALVEOLOPLASTY, BIOPSY OF PALATE LESION, REMOVA RIGHT LINGUAL TORUS;  Surgeon: Gae Bon, DDS;  Location: [facility];  Service: Oral Surgery;  Laterality: N/A;    Family History  Problem Relation Age of Onset  . Cardiomyopathy Mother     ICD pacemaker-ischmic CM  . Heart failure Mother   . Hypertension Mother   . Diabetes Mother   . Thyroid disease Mother   . Cardiomyopathy Father     Deceased  . Coronary artery disease Father   . Heart failure Father   . Heart failure Brother   . Hypertension Brother   . Endometriosis Sister     History   Social History  . Marital Status: Divorced    Spouse Name: N/A    Number of Children: 2  . Years of Education: N/A   Occupational History  .      Works in Environmental consultant   Social History Main Topics  . Smoking status: Current Every Day Smoker -- 0.25 packs/day for 21 years    Types: Cigarettes  . Smokeless tobacco: Never Used     Comment: 5 ciggs daily 11-26-12  . Alcohol Use: No  . Drug Use: No  . Sexual Activity: No   Other Topics Concern  . Not on file   Social History Narrative  . No narrative on file      ROS:  General: Negative for  anorexia, weight loss, fever, chills, fatigue, weakness. Eyes: Negative for vision changes.  ENT: Negative for hoarseness, difficulty swallowing , nasal congestion. CV:  Negative for chest pain, angina, palpitations, dyspnea on exertion, peripheral edema.  Respiratory: Negative for dyspnea at rest, dyspnea on exertion, cough, sputum, wheezing.  GI: See history of present illness. GU:  Negative for dysuria, hematuria, urinary incontinence, urinary frequency, nocturnal urination.  MS: Negative for joint pain, low back pain.  Derm: Negative for rash or itching.  Neuro: Negative for weakness, abnormal sensation, seizure, frequent headaches, memory loss, confusion.  Psych: Negative for anxiety, depression, suicidal ideation, hallucinations.  Endo: Negative for unusual weight change.  Heme: Negative for bruising or bleeding. Allergy: Negative for rash or hives.    Physical Examination:  BP 112/74  Pulse 80  Temp(Src) 98.4 F (36.9 C) (Oral)  Ht 5' 7.5" (1.715 m)  Wt 287 lb 9.6 oz (130.455 kg)  BMI 44.35 kg/m2   General: Well-nourished, well-developed in no acute distress.  Head: Normocephalic, atraumatic.   Eyes: Conjunctiva pink, no icterus. Mouth: Oropharyngeal mucosa moist and pink , no lesions erythema or exudate. Neck: Supple without thyromegaly, masses, or lymphadenopathy.  Lungs: Clear to auscultation bilaterally.  Heart: Regular rate and rhythm, no murmurs rubs or gallops.  Abdomen: Bowel sounds are normal, nontender, nondistended, no hepatosplenomegaly or masses, no abdominal bruits or    hernia , no rebound or guarding.   Rectal: deferred Extremities: No lower extremity edema. No clubbing or deformities.  Neuro: Alert and oriented x 4 , grossly normal neurologically.  Skin: Warm and dry, no rash or jaundice.   Psych: Alert and cooperative, normal mood and affect.

## 2013-11-05 NOTE — Patient Instructions (Signed)
1. Colonoscopy with Dr. Fields. Please see separate instructions. 

## 2013-11-05 NOTE — Assessment & Plan Note (Signed)
53 year old lady with multiple medical problems as outlined above who presents for intermittent rectal bleeding. Suspect benign anorectal source however she has never had a colonoscopy. No other GI concerns. She has a defibrillator, we'll make Endo aware.  I have discussed the risks, alternatives, benefits with regards to but not limited to the risk of reaction to medication, bleeding, infection, perforation and the patient is agreeable to proceed. Written consent to be obtained.

## 2013-11-11 ENCOUNTER — Encounter (HOSPITAL_COMMUNITY): Payer: Self-pay | Admitting: Emergency Medicine

## 2013-11-11 ENCOUNTER — Emergency Department (HOSPITAL_COMMUNITY)
Admission: EM | Admit: 2013-11-11 | Discharge: 2013-11-12 | Disposition: A | Discharge status: Home / Self Care | Payer: Medicaid Other | Attending: Emergency Medicine | Admitting: Emergency Medicine

## 2013-11-11 DIAGNOSIS — K219 Gastro-esophageal reflux disease without esophagitis: Secondary | ICD-10-CM | POA: Insufficient documentation

## 2013-11-11 DIAGNOSIS — R0789 Other chest pain: Secondary | ICD-10-CM | POA: Diagnosis not present

## 2013-11-11 DIAGNOSIS — J441 Chronic obstructive pulmonary disease with (acute) exacerbation: Secondary | ICD-10-CM | POA: Diagnosis not present

## 2013-11-11 DIAGNOSIS — Z8701 Personal history of pneumonia (recurrent): Secondary | ICD-10-CM | POA: Insufficient documentation

## 2013-11-11 DIAGNOSIS — I5022 Chronic systolic (congestive) heart failure: Secondary | ICD-10-CM | POA: Insufficient documentation

## 2013-11-11 DIAGNOSIS — Z8673 Personal history of transient ischemic attack (TIA), and cerebral infarction without residual deficits: Secondary | ICD-10-CM | POA: Insufficient documentation

## 2013-11-11 DIAGNOSIS — F411 Generalized anxiety disorder: Secondary | ICD-10-CM | POA: Insufficient documentation

## 2013-11-11 DIAGNOSIS — R079 Chest pain, unspecified: Secondary | ICD-10-CM

## 2013-11-11 DIAGNOSIS — M129 Arthropathy, unspecified: Secondary | ICD-10-CM | POA: Diagnosis not present

## 2013-11-11 DIAGNOSIS — Z95810 Presence of automatic (implantable) cardiac defibrillator: Secondary | ICD-10-CM | POA: Insufficient documentation

## 2013-11-11 DIAGNOSIS — E669 Obesity, unspecified: Secondary | ICD-10-CM | POA: Insufficient documentation

## 2013-11-11 DIAGNOSIS — E785 Hyperlipidemia, unspecified: Secondary | ICD-10-CM | POA: Diagnosis not present

## 2013-11-11 DIAGNOSIS — I10 Essential (primary) hypertension: Secondary | ICD-10-CM | POA: Diagnosis not present

## 2013-11-11 DIAGNOSIS — E039 Hypothyroidism, unspecified: Secondary | ICD-10-CM | POA: Diagnosis not present

## 2013-11-11 DIAGNOSIS — Z79899 Other long term (current) drug therapy: Secondary | ICD-10-CM | POA: Diagnosis not present

## 2013-11-11 DIAGNOSIS — Z7982 Long term (current) use of aspirin: Secondary | ICD-10-CM | POA: Diagnosis not present

## 2013-11-11 DIAGNOSIS — J45901 Unspecified asthma with (acute) exacerbation: Secondary | ICD-10-CM | POA: Diagnosis not present

## 2013-11-11 DIAGNOSIS — F17200 Nicotine dependence, unspecified, uncomplicated: Secondary | ICD-10-CM | POA: Diagnosis not present

## 2013-11-11 DIAGNOSIS — Z880 Allergy status to penicillin: Secondary | ICD-10-CM | POA: Diagnosis not present

## 2013-11-11 DIAGNOSIS — G40909 Epilepsy, unspecified, not intractable, without status epilepticus: Secondary | ICD-10-CM | POA: Insufficient documentation

## 2013-11-11 LAB — CBC WITH DIFFERENTIAL/PLATELET
Basophils Absolute: 0.1 10*3/uL (ref 0.0–0.1)
Basophils Relative: 1 % (ref 0–1)
EOS ABS: 0.4 10*3/uL (ref 0.0–0.7)
EOS PCT: 4 % (ref 0–5)
HCT: 43.4 % (ref 36.0–46.0)
HEMOGLOBIN: 14.4 g/dL (ref 12.0–15.0)
Lymphocytes Relative: 31 % (ref 12–46)
Lymphs Abs: 3 10*3/uL (ref 0.7–4.0)
MCH: 30.6 pg (ref 26.0–34.0)
MCHC: 33.2 g/dL (ref 30.0–36.0)
MCV: 92.1 fL (ref 78.0–100.0)
MONOS PCT: 7 % (ref 3–12)
Monocytes Absolute: 0.7 10*3/uL (ref 0.1–1.0)
Neutro Abs: 5.5 10*3/uL (ref 1.7–7.7)
Neutrophils Relative %: 57 % (ref 43–77)
Platelets: 288 10*3/uL (ref 150–400)
RBC: 4.71 MIL/uL (ref 3.87–5.11)
RDW: 13.4 % (ref 11.5–15.5)
WBC: 9.6 10*3/uL (ref 4.0–10.5)

## 2013-11-11 LAB — TROPONIN I: Troponin I: <0.3 ng/mL (ref <0.30)

## 2013-11-11 LAB — BASIC METABOLIC PANEL
Anion gap: 13 (ref 5–15)
BUN: 14 mg/dL (ref 6–23)
CO2: 26 mEq/L (ref 19–32)
CREATININE: 0.87 mg/dL (ref 0.50–1.10)
Calcium: 9 mg/dL (ref 8.4–10.5)
Chloride: 101 mEq/L (ref 96–112)
GFR calc Af Amer: 88 mL/min — ABNORMAL LOW (ref >90)
GFR, EST NON AFRICAN AMERICAN: 76 mL/min — AB (ref >90)
GLUCOSE: 101 mg/dL — AB (ref 70–99)
POTASSIUM: 4.4 meq/L (ref 3.7–5.3)
Sodium: 140 mEq/L (ref 137–147)

## 2013-11-11 MED ORDER — KETOROLAC TROMETHAMINE 30 MG/ML IJ SOLN
30.0000 mg | Freq: Once | INTRAMUSCULAR | Status: AC
  Administered 2013-11-11: 30 mg via INTRAVENOUS
  Filled 2013-11-11: qty 1

## 2013-11-11 NOTE — ED Provider Notes (Signed)
CSN: [id number]     Arrival date & time 11/11/13  2247 History  This chart was scribed for Melanie Speak, MD by Lowella Petties, ED Scribe. The patient was seen in room APA10/APA10. Patient's care was started at 11:18 PM.   Chief Complaint  Patient presents with  . Chest Pain   The history is provided by the patient. No language interpreter was used.   HPI Comments: Melanie Cordova is a 53 y.o. female who presents to the Emergency Department complaining of "squeezing" chest tightness onset 2 hours ago. She reports that the pain began as a 10/10 and is currently a 5/10. She states that the pain is exacerbated by deep breathing. She denies SOB. She reports a history of nerve damage and arthritis for which she takes tramadol and gabapentin for this. She reports that her chest tightness began 20 minutes after taking tramadol tonight. She reports a history of congestive heart failure defibrillator placement in 2013. She denies any stent placement or history of blockages.   Past Medical History  Diagnosis Date  . Hypertension     09/2010-normal CMet and CBC; Lipid profile-116, 88, 25, 73  . Cardiomyopathy 08/2010    Presented with congestive heart failure; EF of 15% and 2012; hypotension on medication precludes optimal dosing  . Gastroesophageal reflux disease   . Pneumonia   . Obesity   . Hyperlipidemia   . Tobacco abuse     20 pack years  . CHF (congestive heart failure)   . Asthma   . Shortness of breath   . Headache(784.0)   . Arthritis   . Anxiety   . Neuropathy   . 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 Chronic systolic heart failure   . Dysrhythmia   . Automatic implantable cardioverter-defibrillator in situ     2013, july  . COPD (chronic obstructive pulmonary disease)     2013  . Bell palsy     states has had 3 episodes  . Stroke      stroke in 07/2012 and another in June, 2014  . Seizures     last one at age 53  . Neuromuscular disorder      Neuropathy, right foot and leg  . Hypothyroidism     Recent TSH was normal.   Past Surgical History  Procedure Laterality Date  . Cesarean section      X2  . Tee without cardioversion N/A 07/11/2012    Procedure: TRANSESOPHAGEAL ECHOCARDIOGRAM (TEE);  Surgeon: Thayer Headings, MD;  Location: [facility];  Service: Cardiovascular;  Laterality: N/A;  . Tubal ligation    . Multiple extractions with alveoloplasty N/A 09/24/2012    Procedure: MULTIPLE EXTRACION #2, 4, 6, 7 ,8, 9, 11, 13, 18, 20, 21, 22, 23, 24, 25, 26, 27, 29 WITH ALVEOLOPLASTY, BIOPSY OF PALATE LESION, REMOVA RIGHT LINGUAL TORUS;  Surgeon: Gae Bon, DDS;  Location: [facility];  Service: Oral Surgery;  Laterality: N/A;   Family History  Problem Relation Age of Onset  . Cardiomyopathy Mother     ICD pacemaker-ischmic CM  . Heart failure Mother   . Hypertension Mother   . Diabetes Mother   . Thyroid disease Mother   . Cardiomyopathy Father     Deceased  . Coronary artery disease Father   . Heart failure Father   . Heart failure Brother   . Hypertension Brother   . Endometriosis Sister   . Colon cancer Neg Hx   . Liver  disease Neg Hx    History  Substance Use Topics  . Smoking status: Current Every Day Smoker -- 0.25 packs/day for 21 years    Types: Cigarettes  . Smokeless tobacco: Never Used     Comment: 5 ciggs daily 11-26-12  . Alcohol Use: No   OB History   Grav Para Term Preterm Abortions TAB SAB Ect Mult Living                 Review of Systems  Respiratory: Positive for chest tightness. Negative for shortness of breath.   All other systems reviewed and are negative.  Allergies  Fish allergy; Iodinated diagnostic agents; Iodine; Lentil; Penicillins; Lipitor; Spironolactone; and Sulfa antibiotics  Home Medications   Prior to Admission medications   Medication Sig Start Date End Date Taking? Authorizing Provider  albuterol (PROVENTIL HFA;VENTOLIN HFA) 108 (90 BASE) MCG/ACT inhaler Inhale 2 puffs  into the lungs every 6 (six) hours as needed for wheezing or shortness of breath. 07/12/12   Tonia Brooms, MD  aspirin EC 81 MG tablet Take 81 mg by mouth daily.    Historical Provider, MD  budesonide-formoterol (SYMBICORT) 80-4.5 MCG/ACT inhaler Inhale 2 puffs into the lungs 2 (two) times daily.    Historical Provider, MD  carvedilol (COREG) 12.5 MG tablet Take 25 mg by mouth 2 (two) times daily with a meal.    Historical Provider, MD  desloratadine (CLARINEX) 5 MG tablet Take 5 mg by mouth daily.    Historical Provider, MD  diclofenac sodium (VOLTAREN) 1 % GEL Apply 2 g topically 4 (four) times daily as needed (pain).     Historical Provider, MD  diphenhydrAMINE (BENADRYL) 25 mg capsule Take 25 mg by mouth daily.    Historical Provider, MD  ferrous sulfate (IRON SUPPLEMENT) 325 (65 FE) MG tablet Take 325 mg by mouth daily with breakfast.    Historical Provider, MD  furosemide (LASIX) 40 MG tablet Take 1 tablet (40 mg total) by mouth 2 (two) times daily. 08/29/12   Yehuda Savannah, MD  gabapentin (NEURONTIN) 300 MG capsule Take 300 mg by mouth 3 (three) times daily.  02/14/12   Yehuda Savannah, MD  levothyroxine (SYNTHROID, LEVOTHROID) 200 MCG tablet Take 200 mcg by mouth daily before breakfast. Takes with 98mg to equal 2284m    Historical Provider, MD  levothyroxine (SYNTHROID, LEVOTHROID) 25 MCG tablet Take 25 mcg by mouth daily before breakfast. Takes with 20085mto equal 225m12m  Historical Provider, MD  lisinopril (PRINIVIL,ZESTRIL) 10 MG tablet Take 1 tablet (10 mg total) by mouth daily. 08/29/12   RobeYehuda Savannah  lovastatin (MEVACOR) 40 MG tablet Take 1 tablet (40 mg total) by mouth at bedtime. 08/29/12   RobeYehuda Savannah  Naproxen Sodium (ALEVE PO) Take 2 tablets by mouth at bedtime as needed (for pain).    Historical Provider, MD  oxymetazoline (AFRIN) 0.05 % nasal spray Place 2 sprays into the nose daily as needed for congestion.    Historical Provider, MD  peg 3350 powder  (MOVIPREP) 100 G SOLR Take 1 kit (200 g total) by mouth as directed. 11/05/13   LeslMahala Menghini-C  potassium chloride SA (K-DUR,KLOR-CON) 20 MEQ tablet Take 2 tablets (40 mEq total) by mouth 2 (two) times daily. 08/29/12   RobeYehuda Savannah  ranitidine (ZANTAC) 150 MG tablet Take 150 mg by mouth daily.     Historical Provider, MD  tiotropium (SPIRIVA) 18 MCG inhalation capsule Place 18 mcg  into inhaler and inhale daily.    Historical Provider, MD   Triage Vitals: BP 98/72  Pulse 76  Temp(Src) 98 F (36.7 C) (Oral)  Resp 16  Ht 5' 7.5" (1.715 m)  Wt 285 lb (129.275 kg)  BMI 43.95 kg/m2  SpO2 96% Physical Exam  Nursing note and vitals reviewed. Constitutional: She is oriented to person, place, and time. She appears well-developed and well-nourished. No distress.  HENT:  Head: Normocephalic and atraumatic.  Eyes: Conjunctivae and EOM are normal.  Neck: Neck supple. No tracheal deviation present.  Cardiovascular: Normal rate, regular rhythm and normal heart sounds.   No murmur heard. Pulmonary/Chest: Effort normal and breath sounds normal. No respiratory distress. She has no wheezes. She has no rales. She exhibits no tenderness.  Abdominal: Soft. Bowel sounds are normal. There is no tenderness.  Musculoskeletal: Normal range of motion.  Neurological: She is alert and oriented to person, place, and time.  Skin: Skin is warm and dry.  Psychiatric: She has a normal mood and affect. Her behavior is normal.    ED Course  Procedures (including critical care time) DIAGNOSTIC STUDIES: Oxygen Saturation is 96% on room air, normal by my interpretation.    COORDINATION OF CARE: 11:24 PM-Discussed treatment plan which includes pain medication and an EKG with pt at bedside and pt agreed to plan.   Labs Review Labs Reviewed  CBC WITH DIFFERENTIAL  BASIC METABOLIC PANEL  TROPONIN I    Imaging Review No results found.   EKG Interpretation   Date/Time:  Monday November 11 2013  23:20:33 EDT Ventricular Rate:  80 PR Interval:  158 QRS Duration: 106 QT Interval:  397 QTC Calculation: 458 R Axis:   -21 Text Interpretation:  Sinus rhythm Left axis deviation Inferior infarct,  old Confirmed by DELOS  MD, Brandan Robicheaux ([id number]) on 11/11/2013 11:33:53 PM      MDM   Final diagnoses:  None    Patient presents for evaluation of epigastric/chest discomfort that started after taking tramadol. There is no shortness of breath, diaphoresis, nausea, or radiation to the arm or jaw. She denies any prior history of coronary artery disease. She does have a defibrillator in place due to to some sort of cardiomyopathy. Workup today reveals an unchanged EKG and negative troponin x2. She is feeling better with medications given in the ER and I highly doubt a cardiac etiology. She will be discharged to home with instructions to followup with her primary Dr. and return to the ER if her symptoms substantially worsen or change.   I personally performed the services described in this documentation, which was scribed in my presence. The recorded information has been reviewed and is accurate.      Melanie Speak, MD 11/12/13 [phone]

## 2013-11-11 NOTE — ED Notes (Signed)
Pt states she took Tramadol for the first time tonight, states approx 1 hour later she started having some left chest tightness that has somewhat resolved at this time.  Pt denies other complaints

## 2013-11-12 LAB — TROPONIN I

## 2013-11-12 MED ORDER — MORPHINE SULFATE 4 MG/ML IJ SOLN
4.0000 mg | Freq: Once | INTRAMUSCULAR | Status: AC
  Administered 2013-11-12: 4 mg via INTRAVENOUS
  Filled 2013-11-12: qty 1

## 2013-11-12 NOTE — Discharge Instructions (Signed)
Followup with your primary Dr. this week for a recheck.  Return to the emergency department if your symptoms substantially worsen or change.   Chest Pain (Nonspecific) It is often hard to give a specific diagnosis for the cause of chest pain. There is always a chance that your pain could be related to something serious, such as a heart attack or a blood clot in the lungs. You need to follow up with your health care provider for further evaluation. CAUSES   Heartburn.  Pneumonia or bronchitis.  Anxiety or stress.  Inflammation around your heart (pericarditis) or lung (pleuritis or pleurisy).  A blood clot in the lung.  A collapsed lung (pneumothorax). It can develop suddenly on its own (spontaneous pneumothorax) or from trauma to the chest.  Shingles infection (herpes zoster virus). The chest wall is composed of bones, muscles, and cartilage. Any of these can be the source of the pain.  The bones can be bruised by injury.  The muscles or cartilage can be strained by coughing or overwork.  The cartilage can be affected by inflammation and become sore (costochondritis). DIAGNOSIS  Lab tests or other studies may be needed to find the cause of your pain. Your health care provider may have you take a test called an ambulatory electrocardiogram (ECG). An ECG records your heartbeat patterns over a 24-hour period. You may also have other tests, such as:  Transthoracic echocardiogram (TTE). During echocardiography, sound waves are used to evaluate how blood flows through your heart.  Transesophageal echocardiogram (TEE).  Cardiac monitoring. This allows your health care provider to monitor your heart rate and rhythm in real time.  Holter monitor. This is a portable device that records your heartbeat and can help diagnose heart arrhythmias. It allows your health care provider to track your heart activity for several days, if needed.  Stress tests by exercise or by giving medicine that  makes the heart beat faster. TREATMENT   Treatment depends on what may be causing your chest pain. Treatment may include:  Acid blockers for heartburn.  Anti-inflammatory medicine.  Pain medicine for inflammatory conditions.  Antibiotics if an infection is present.  You may be advised to change lifestyle habits. This includes stopping smoking and avoiding alcohol, caffeine, and chocolate.  You may be advised to keep your head raised (elevated) when sleeping. This reduces the chance of acid going backward from your stomach into your esophagus. Most of the time, nonspecific chest pain will improve within 2-3 days with rest and mild pain medicine.  HOME CARE INSTRUCTIONS   If antibiotics were prescribed, take them as directed. Finish them even if you start to feel better.  For the next few days, avoid physical activities that bring on chest pain. Continue physical activities as directed.  Do not use any tobacco products, including cigarettes, chewing tobacco, or electronic cigarettes.  Avoid drinking alcohol.  Only take medicine as directed by your health care provider.  Follow your health care provider's suggestions for further testing if your chest pain does not go away.  Keep any follow-up appointments you made. If you do not go to an appointment, you could develop lasting (chronic) problems with pain. If there is any problem keeping an appointment, call to reschedule. SEEK MEDICAL CARE IF:   Your chest pain does not go away, even after treatment.  You have a rash with blisters on your chest.  You have a fever. SEEK IMMEDIATE MEDICAL CARE IF:   You have increased chest pain or pain  that spreads to your arm, neck, jaw, back, or abdomen.  You have shortness of breath.  You have an increasing cough, or you cough up blood.  You have severe back or abdominal pain.  You feel nauseous or vomit.  You have severe weakness.  You faint.  You have chills. This is an  emergency. Do not wait to see if the pain will go away. Get medical help at once. Call your local emergency services (911 in [place] Do not drive yourself to the hospital. MAKE SURE YOU:   Understand these instructions.  Will watch your condition.  Will get help right away if you are not doing well or get worse. Document Released: 12/08/2004 Document Revised: 03/05/2013 Document Reviewed: 10/04/2007 [facility] Patient Information 2015 [place], [name]. This information is not intended to replace advice given to you by your health care provider. Make sure you discuss any questions you have with your health care provider.

## 2013-11-19 ENCOUNTER — Ambulatory Visit (INDEPENDENT_AMBULATORY_CARE_PROVIDER_SITE_OTHER): Payer: Medicaid Other | Admitting: *Deleted

## 2013-11-19 DIAGNOSIS — I428 Other cardiomyopathies: Secondary | ICD-10-CM

## 2013-11-19 DIAGNOSIS — I5022 Chronic systolic (congestive) heart failure: Secondary | ICD-10-CM

## 2013-11-19 DIAGNOSIS — I429 Cardiomyopathy, unspecified: Secondary | ICD-10-CM

## 2013-11-19 NOTE — Progress Notes (Signed)
Remote ICD transmission.   

## 2013-11-21 ENCOUNTER — Other Ambulatory Visit: Payer: Self-pay

## 2013-11-21 ENCOUNTER — Telehealth: Payer: Self-pay

## 2013-11-21 LAB — MDC_IDC_ENUM_SESS_TYPE_REMOTE
Battery Remaining Longevity: 83 mo
Brady Statistic RV Percent Paced: 1 %
Date Time Interrogation Session: 20150908110628
HighPow Impedance: 92 Ohm
HighPow Impedance: 92 Ohm
Lead Channel Pacing Threshold Amplitude: 0.75 V
Lead Channel Sensing Intrinsic Amplitude: 12 mV
Lead Channel Setting Pacing Amplitude: 2.5 V
Lead Channel Setting Pacing Pulse Width: 0.5 ms
Lead Channel Setting Sensing Sensitivity: 0.5 mV
MDC IDC MSMT BATTERY REMAINING PERCENTAGE: 78 %
MDC IDC MSMT BATTERY VOLTAGE: 2.98 V
MDC IDC MSMT LEADCHNL RV IMPEDANCE VALUE: 640 Ohm
MDC IDC MSMT LEADCHNL RV PACING THRESHOLD PULSEWIDTH: 0.5 ms
MDC IDC PG SERIAL: 1022442
MDC IDC SET ZONE DETECTION INTERVAL: 300 ms

## 2013-11-21 MED ORDER — PEG-KCL-NACL-NASULF-NA ASC-C 100 G PO SOLR: 1.0000 | ORAL | Status: DC

## 2013-11-21 NOTE — Telephone Encounter (Signed)
New prep sent in to [facility]

## 2013-11-21 NOTE — Telephone Encounter (Signed)
Pt left a Vm that she is scheduled for colonoscopy tomorrow and has not received her prep. Routing to [facility].

## 2013-11-22 ENCOUNTER — Telehealth: Payer: Self-pay | Admitting: Gastroenterology

## 2013-11-22 ENCOUNTER — Other Ambulatory Visit: Payer: Self-pay | Admitting: Gastroenterology

## 2013-11-22 MED ORDER — PEG-KCL-NACL-NASULF-NA ASC-C 100 G PO SOLR: 1.0000 | ORAL | Status: DC

## 2013-11-22 NOTE — Telephone Encounter (Signed)
PATIENT CALLED WANTING TO CANCEL /RESCHEDULE HER COLONOSCOPY FOR TODAY

## 2013-11-22 NOTE — Telephone Encounter (Signed)
R/S TO 10/5 NEW INSTRUCTIONS HAVE BEEN  MAILED

## 2013-12-03 ENCOUNTER — Encounter: Payer: Self-pay | Admitting: Cardiology

## 2013-12-04 ENCOUNTER — Encounter: Payer: Self-pay | Admitting: Internal Medicine

## 2013-12-05 ENCOUNTER — Encounter: Payer: Self-pay | Admitting: Cardiology

## 2013-12-09 ENCOUNTER — Telehealth: Payer: Self-pay | Admitting: Gastroenterology

## 2013-12-09 ENCOUNTER — Other Ambulatory Visit: Payer: Self-pay

## 2013-12-09 MED ORDER — PEG-KCL-NACL-NASULF-NA ASC-C 100 G PO SOLR: 1.0000 | ORAL | Status: DC

## 2013-12-09 NOTE — Telephone Encounter (Signed)
Pt is scheduled for a tcs and called to say that [facility] needs another prep rx sent in to them.

## 2013-12-09 NOTE — Telephone Encounter (Signed)
Done

## 2013-12-16 ENCOUNTER — Encounter (HOSPITAL_COMMUNITY): Payer: Self-pay | Admitting: *Deleted

## 2013-12-16 ENCOUNTER — Encounter (HOSPITAL_COMMUNITY)
Admission: RE | Disposition: A | Discharge status: Home / Self Care | Payer: Self-pay | Source: Ambulatory Visit | Attending: Gastroenterology

## 2013-12-16 ENCOUNTER — Ambulatory Visit (HOSPITAL_COMMUNITY)
Admission: RE | Admit: 2013-12-16 | Discharge: 2013-12-16 | Disposition: A | Discharge status: Home / Self Care | Payer: Medicaid Other | Source: Ambulatory Visit | Attending: Gastroenterology | Admitting: Gastroenterology

## 2013-12-16 DIAGNOSIS — K635 Polyp of colon: Secondary | ICD-10-CM

## 2013-12-16 DIAGNOSIS — E785 Hyperlipidemia, unspecified: Secondary | ICD-10-CM | POA: Insufficient documentation

## 2013-12-16 DIAGNOSIS — D124 Benign neoplasm of descending colon: Secondary | ICD-10-CM | POA: Diagnosis not present

## 2013-12-16 DIAGNOSIS — Z79899 Other long term (current) drug therapy: Secondary | ICD-10-CM | POA: Diagnosis not present

## 2013-12-16 DIAGNOSIS — D128 Benign neoplasm of rectum: Secondary | ICD-10-CM | POA: Diagnosis not present

## 2013-12-16 DIAGNOSIS — K621 Rectal polyp: Secondary | ICD-10-CM

## 2013-12-16 DIAGNOSIS — D125 Benign neoplasm of sigmoid colon: Secondary | ICD-10-CM

## 2013-12-16 DIAGNOSIS — Z7982 Long term (current) use of aspirin: Secondary | ICD-10-CM | POA: Insufficient documentation

## 2013-12-16 DIAGNOSIS — Z6841 Body Mass Index (BMI) 40.0 and over, adult: Secondary | ICD-10-CM | POA: Diagnosis not present

## 2013-12-16 DIAGNOSIS — Z720 Tobacco use: Secondary | ICD-10-CM | POA: Insufficient documentation

## 2013-12-16 DIAGNOSIS — J449 Chronic obstructive pulmonary disease, unspecified: Secondary | ICD-10-CM | POA: Diagnosis not present

## 2013-12-16 DIAGNOSIS — E669 Obesity, unspecified: Secondary | ICD-10-CM | POA: Diagnosis not present

## 2013-12-16 DIAGNOSIS — I10 Essential (primary) hypertension: Secondary | ICD-10-CM | POA: Insufficient documentation

## 2013-12-16 DIAGNOSIS — F419 Anxiety disorder, unspecified: Secondary | ICD-10-CM | POA: Diagnosis not present

## 2013-12-16 DIAGNOSIS — K625 Hemorrhage of anus and rectum: Secondary | ICD-10-CM | POA: Diagnosis present

## 2013-12-16 HISTORY — PX: COLONOSCOPY: SHX5424

## 2013-12-16 SURGERY — COLONOSCOPY
Anesthesia: Moderate Sedation

## 2013-12-16 MED ORDER — MEPERIDINE HCL 100 MG/ML IJ SOLN
INTRAMUSCULAR | Status: DC | PRN
  Administered 2013-12-16 (×3): 25 mg via INTRAVENOUS

## 2013-12-16 MED ORDER — MEPERIDINE HCL 100 MG/ML IJ SOLN
INTRAMUSCULAR | Status: AC
  Filled 2013-12-16: qty 2

## 2013-12-16 MED ORDER — MIDAZOLAM HCL 5 MG/5ML IJ SOLN
INTRAMUSCULAR | Status: DC
Start: 2013-12-16 — End: 2013-12-16
  Filled 2013-12-16: qty 10

## 2013-12-16 MED ORDER — MIDAZOLAM HCL 5 MG/5ML IJ SOLN
INTRAMUSCULAR | Status: DC | PRN
  Administered 2013-12-16 (×2): 2 mg via INTRAVENOUS
  Administered 2013-12-16 (×2): 1 mg via INTRAVENOUS

## 2013-12-16 MED ORDER — SODIUM CHLORIDE 0.9 % IV SOLN
INTRAVENOUS | Status: DC
  Administered 2013-12-16: 09:00:00 via INTRAVENOUS

## 2013-12-16 NOTE — Progress Notes (Signed)
REVIEWED. AGREE. NO ADDITIONAL RECOMMENDATIONS. 

## 2013-12-16 NOTE — H&P (Signed)
Primary Care Physician:  Rosita Fire, MD Primary Gastroenterologist:  Dr. Oneida Alar  Pre-Procedure History & Physical: HPI:  Melanie Cordova is a 53 y.o. female here for [place].  Past Medical History  Diagnosis Date  . Hypertension     09/2010-normal CMet and CBC; Lipid profile-116, 88, 25, 73  . Cardiomyopathy 08/2010    Presented with congestive heart failure; EF of 15% and 2012; hypotension on medication precludes optimal dosing  . Gastroesophageal reflux disease   . Pneumonia   . Obesity   . Hyperlipidemia   . Tobacco abuse     20 pack years  . CHF (congestive heart failure)   . Asthma   . Shortness of breath   . Headache(784.0)   . Arthritis   . Anxiety   . Neuropathy   . 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 Chronic systolic heart failure   . Dysrhythmia   . Automatic implantable cardioverter-defibrillator in situ     2013, july  . COPD (chronic obstructive pulmonary disease)     2013  . Bell palsy     states has had 3 episodes  . Stroke      stroke in 07/2012 and another in June, 2014  . Seizures     last one at age 53  . Neuromuscular disorder     Neuropathy, right foot and leg  . Hypothyroidism     Recent TSH was normal.    Past Surgical History  Procedure Laterality Date  . Cesarean section      X2  . Tee without cardioversion N/A 07/11/2012    Procedure: TRANSESOPHAGEAL ECHOCARDIOGRAM (TEE);  Surgeon: Thayer Headings, MD;  Location: [facility];  Service: Cardiovascular;  Laterality: N/A;  . Tubal ligation    . Multiple extractions with alveoloplasty N/A 09/24/2012    Procedure: MULTIPLE EXTRACION #2, 4, 6, 7 ,8, 9, 11, 13, 18, 20, 21, 22, 23, 24, 25, 26, 27, 29 WITH ALVEOLOPLASTY, BIOPSY OF PALATE LESION, REMOVA RIGHT LINGUAL TORUS;  Surgeon: Gae Bon, DDS;  Location: [place];  Service: Oral Surgery;  Laterality: N/A;  . Ep implantable device      [employer]    Prior to Admission medications   Medication Sig  Start Date End Date Taking? Authorizing Provider  albuterol (PROVENTIL HFA;VENTOLIN HFA) 108 (90 BASE) MCG/ACT inhaler Inhale 2 puffs into the lungs every 6 (six) hours as needed for wheezing or shortness of breath. 07/12/12  Yes Tonia Brooms, MD  aspirin EC 81 MG tablet Take 81 mg by mouth daily.   Yes Historical Provider, MD  budesonide-formoterol (SYMBICORT) 80-4.5 MCG/ACT inhaler Inhale 2 puffs into the lungs 2 (two) times daily.   Yes Historical Provider, MD  carvedilol (COREG) 12.5 MG tablet Take 25 mg by mouth 2 (two) times daily with a meal.   Yes Historical Provider, MD  desloratadine (CLARINEX) 5 MG tablet Take 5 mg by mouth daily.   Yes Historical Provider, MD  diphenhydrAMINE (BENADRYL) 25 mg capsule Take 25 mg by mouth daily.   Yes Historical Provider, MD  ferrous sulfate (IRON SUPPLEMENT) 325 (65 FE) MG tablet Take 325 mg by mouth daily with breakfast.   Yes Historical Provider, MD  furosemide (LASIX) 40 MG tablet Take 1 tablet (40 mg total) by mouth 2 (two) times daily. 08/29/12  Yes Yehuda Savannah, MD  gabapentin (NEURONTIN) 300 MG capsule Take 300 mg by mouth 3 (three) times daily.  02/14/12  Yes Yehuda Savannah, MD  levothyroxine (SYNTHROID, LEVOTHROID) 200 MCG tablet Take 200 mcg by mouth daily before breakfast. Takes with 34mg to equal 224m   Yes Historical Provider, MD  levothyroxine (SYNTHROID, LEVOTHROID) 25 MCG tablet Take 25 mcg by mouth daily before breakfast. Takes with 20041mto equal 225m81m Yes Historical Provider, MD  lisinopril (PRINIVIL,ZESTRIL) 10 MG tablet Take 1 tablet (10 mg total) by mouth daily. 08/29/12  Yes RobeYehuda Savannah  lovastatin (MEVACOR) 40 MG tablet Take 1 tablet (40 mg total) by mouth at bedtime. 08/29/12  Yes RobeYehuda Savannah  Naproxen Sodium (ALEVE PO) Take 2 tablets by mouth at bedtime as needed (for pain).   Yes Historical Provider, MD  oxymetazoline (AFRIN) 0.05 % nasal spray Place 2 sprays into the nose daily as needed for  congestion.   Yes Historical Provider, MD  peg 3350 powder (MOVIPREP) 100 G SOLR Take 1 kit (200 g total) by mouth as directed. 12/09/13  Yes SandDanie Binder  potassium chloride SA (K-DUR,KLOR-CON) 20 MEQ tablet Take 2 tablets (40 mEq total) by mouth 2 (two) times daily. 08/29/12  Yes RobeYehuda Savannah  ranitidine (ZANTAC) 150 MG tablet Take 150 mg by mouth daily.    Yes Historical Provider, MD  tiotropium (SPIRIVA) 18 MCG inhalation capsule Place 18 mcg into inhaler and inhale daily.   Yes Historical Provider, MD  diclofenac sodium (VOLTAREN) 1 % GEL Apply 2 g topically 4 (four) times daily as needed (pain).     Historical Provider, MD    Allergies as of 11/05/2013 - Review Complete 11/05/2013  Allergen Reaction Noted  . Fish allergy Anaphylaxis 03/20/2012  . Iodinated diagnostic agents Hives and Other (See Comments) 09/24/2010  . Iodine Hives and Other (See Comments) 12/23/2010  . Lentil Anaphylaxis 03/20/2012  . Penicillins Anaphylaxis and Shortness Of Breath 05/04/2010  . Lipitor [atorvastatin] Itching and Rash 07/27/2012  . Spironolactone Rash 07/11/2011  . Sulfa antibiotics Other (See Comments) 05/04/2010    Family History  Problem Relation Age of Onset  . Cardiomyopathy Mother     ICD pacemaker-ischmic CM  . Heart failure Mother   . Hypertension Mother   . Diabetes Mother   . Thyroid disease Mother   . Cardiomyopathy Father     Deceased  . Coronary artery disease Father   . Heart failure Father   . Heart failure Brother   . Hypertension Brother   . Endometriosis Sister   . Colon cancer Neg Hx   . Liver disease Neg Hx     History   Social History  . Marital Status: Divorced    Spouse Name: N/A    Number of Children: 2  . Years of Education: N/A   Occupational History  .      Works in [employer] History Main Topics  . Smoking status: Current Every Day Smoker -- 0.25 packs/day for 21 years    Types: Cigarettes  . Smokeless tobacco: Never  Used     Comment: 5 ciggs daily 11-26-12  . Alcohol Use: No  . Drug Use: No  . Sexual Activity: No   Other Topics Concern  . Not on file   Social History Narrative  . No narrative on file    Review of Systems: See HPI, otherwise negative ROS   Physical Exam: BP 114/70  Pulse 84  Temp(Src) 98 F (36.7 C) (Oral)  Ht 5' 7.5" (1.715 m)  Wt 287 lb (130.182 kg)  BMI 44.26  kg/m2  SpO2 96% General:   Alert,  pleasant and cooperative in NAD Head:  Normocephalic and atraumatic. Neck:  Supple; Lungs:  Clear throughout to auscultation.    Heart:  Regular rate and rhythm. Abdomen:  Soft, nontender and nondistended. Normal bowel sounds, without guarding, and without rebound.   Neurologic:  Alert and  oriented x4;  grossly normal neurologically.  Impression/Plan:     RECTAL BLEEDING  PLAN:  1. TCS/? Hemorrhoid banding TODAY

## 2013-12-16 NOTE — Discharge Instructions (Signed)
You had 4 polyps removed. Your rectal bleeding is due to internal hemorrhoids. Your small bowel is normal.   Next colonoscopy in 5-10 years.  FOLLOW A HIGH FIBER DIET. SEE INFO BELOW.  YOUR BIOPSY RESULTS SHOULD BE BACK IN 14 DAYS.  Colonoscopy Care After Read the instructions outlined below and refer to this sheet in the next week. These discharge instructions provide you with general information on caring for yourself after you leave the hospital. While your treatment has been planned according to the most current medical practices available, unavoidable complications occasionally occur. If you have any problems or questions after discharge, call DR. Korianna Washer, [phone].  ACTIVITY  You may resume your regular activity, but move at a slower pace for the next 24 hours.   Take frequent rest periods for the next 24 hours.   Walking will help get rid of the air and reduce the bloated feeling in your belly (abdomen).   No driving for 24 hours (because of the medicine (anesthesia) used during the test).   You may shower.   Do not sign any important legal documents or operate any machinery for 24 hours (because of the anesthesia used during the test).    NUTRITION  Drink plenty of fluids.   You may resume your normal diet as instructed by your doctor.   Begin with a light meal and progress to your normal diet. Heavy or fried foods are harder to digest and may make you feel sick to your stomach (nauseated).   Avoid alcoholic beverages for 24 hours or as instructed.    MEDICATIONS  You may resume your normal medications.   WHAT YOU CAN EXPECT TODAY  Some feelings of bloating in the abdomen.   Passage of more gas than usual.   Spotting of blood in your stool or on the toilet paper  .  IF YOU HAD POLYPS REMOVED DURING THE COLONOSCOPY:  Eat a soft diet IF YOU HAVE NAUSEA, BLOATING, ABDOMINAL PAIN, OR VOMITING.    FINDING OUT THE RESULTS OF YOUR TEST Not all test  results are available during your visit. DR. Oneida Alar WILL CALL YOU WITHIN 7 DAYS OF YOUR PROCEDUE WITH YOUR RESULTS. Do not assume everything is normal if you have not heard from DR. Marialy Urbanczyk IN ONE WEEK, CALL HER OFFICE AT [phone].  SEEK IMMEDIATE MEDICAL ATTENTION AND CALL THE OFFICE: [phone] IF:  You have more than a spotting of blood in your stool.   Your belly is swollen (abdominal distention).   You are nauseated or vomiting.   You have a temperature over 101F.   You have abdominal pain or discomfort that is severe or gets worse throughout the day.   High-Fiber Diet A high-fiber diet changes your normal diet to include more whole grains, legumes, fruits, and vegetables. Changes in the diet involve replacing refined carbohydrates with unrefined foods. The calorie level of the diet is essentially unchanged. The Dietary Reference Intake (recommended amount) for adult males is 38 grams per day. For adult females, it is 25 grams per day. Pregnant and lactating women should consume 28 grams of fiber per day. Fiber is the intact part of a plant that is not broken down during digestion. Functional fiber is fiber that has been isolated from the plant to provide a beneficial effect in the body. PURPOSE  Increase stool bulk.   Ease and regulate bowel movements.   Lower cholesterol.  INDICATIONS THAT YOU NEED MORE FIBER  Constipation and hemorrhoids.   Uncomplicated diverticulosis (  intestine condition) and irritable bowel syndrome.   Weight management.   As a protective measure against hardening of the arteries (atherosclerosis), diabetes, and cancer.   GUIDELINES FOR INCREASING FIBER IN THE DIET  Start adding fiber to the diet slowly. A gradual increase of about 5 more grams (2 slices of whole-wheat bread, 2 servings of most fruits or vegetables, or 1 bowl of high-fiber cereal) per day is best. Too rapid an increase in fiber may result in constipation, flatulence, and bloating.    Drink enough water and fluids to keep your urine clear or pale yellow. Water, juice, or caffeine-free drinks are recommended. Not drinking enough fluid may cause constipation.   Eat a variety of high-fiber foods rather than one type of fiber.   Try to increase your intake of fiber through using high-fiber foods rather than fiber pills or supplements that contain small amounts of fiber.   The goal is to change the types of food eaten. Do not supplement your present diet with high-fiber foods, but replace foods in your present diet.  INCLUDE A VARIETY OF FIBER SOURCES  Replace refined and processed grains with whole grains, canned fruits with fresh fruits, and incorporate other fiber sources. White rice, white breads, and most bakery goods contain little or no fiber.   Brown whole-grain rice, buckwheat oats, and many fruits and vegetables are all good sources of fiber. These include: broccoli, Brussels sprouts, cabbage, cauliflower, beets, sweet potatoes, white potatoes (skin on), carrots, tomatoes, eggplant, squash, berries, fresh fruits, and dried fruits.   Cereals appear to be the richest source of fiber. Cereal fiber is found in whole grains and bran. Bran is the fiber-rich outer coat of cereal grain, which is largely removed in refining. In whole-grain cereals, the bran remains. In breakfast cereals, the largest amount of fiber is found in those with "bran" in their names. The fiber content is sometimes indicated on the label.   You may need to include additional fruits and vegetables each day.   In baking, for 1 cup white flour, you may use the following substitutions:   1 cup whole-wheat flour minus 2 tablespoons.   1/2 cup white flour plus 1/2 cup whole-wheat flour.   Polyps, Colon  A polyp is extra tissue that grows inside your body. Colon polyps grow in the large intestine. The large intestine, also called the colon, is part of your digestive system. It is a long, hollow tube at  the end of your digestive tract where your body makes and stores stool. Most polyps are not dangerous. They are benign. This means they are not cancerous. But over time, some types of polyps can turn into cancer. Polyps that are smaller than a pea are usually not harmful. But larger polyps could someday become or may already be cancerous. To be safe, doctors remove all polyps and test them.   WHO GETS POLYPS? Anyone can get polyps, but certain people are more likely than others. You may have a greater chance of getting polyps if:  You are over 50.   You have had polyps before.   Someone in your family has had polyps.   Someone in your family has had cancer of the large intestine.   Find out if someone in your family has had polyps. You may also be more likely to get polyps if you:   Eat a lot of fatty foods   Smoke   Drink alcohol   Do not exercise  Eat too much  TREATMENT  The caregiver will remove the polyp during sigmoidoscopy or colonoscopy.    PREVENTION There is not one sure way to prevent polyps. You might be able to lower your risk of getting them if you:  Eat more fruits and vegetables and less fatty food.   Do not smoke.   Avoid alcohol.   Exercise every day.   Lose weight if you are overweight.   Eating more calcium and folate can also lower your risk of getting polyps. Some foods that are rich in calcium are milk, cheese, and broccoli. Some foods that are rich in folate are chickpeas, kidney beans, and spinach.   Hemorrhoids Hemorrhoids are dilated (enlarged) veins around the rectum. Sometimes clots will form in the veins. This makes them swollen and painful. These are called thrombosed hemorrhoids. Causes of hemorrhoids include:  Constipation.   Straining to have a bowel movement.   HEAVY LIFTING HOME CARE INSTRUCTIONS  Eat a well balanced diet and drink 6 to 8 glasses of water every day to avoid constipation. You may also use a bulk laxative.    Avoid straining to have bowel movements.   Keep anal area dry and clean.   Do not use a donut shaped pillow or sit on the toilet for long periods. This increases blood pooling and pain.   Move your bowels when your body has the urge; this will require less straining and will decrease pain and pressure.

## 2013-12-17 NOTE — Op Note (Addendum)
[facility] [address] [place], [address]   COLONOSCOPY PROCEDURE REPORT  PATIENT: Melanie Cordova, Melanie Cordova  MR#: [id number] BIRTHDATE: [birth year] , 52  yrs. old GENDER: female ENDOSCOPIST: Barney Drain, MD REFERRED LY:[id number] Fanta, M.D. PROCEDURE DATE:  12/16/2013 PROCEDURE:   Colonoscopy with snare polypectomy and Colonoscopy with cold biopsy polypectomy INDICATIONS:rectal bleeding. MEDICATIONS: Demerol 75 mg IV and Versed 6 mg IV  DESCRIPTION OF PROCEDURE:    Physical exam was performed.  Informed consent was obtained from the patient after explaining the benefits, risks, and alternatives to procedure.  The patient was connected to monitor and placed in left lateral position. Continuous oxygen was provided by nasal cannula and IV medicine administered through an indwelling cannula.  After administration of sedation and rectal exam, the patients rectum was intubated and the EC-3890Li ([id number])  colonoscope was advanced under direct visualization to the ileum.  The scope was removed slowly by carefully examining the color, texture, anatomy, and integrity mucosa on the way out.  The patient was recovered in endoscopy and discharged home in satisfactory condition.    COLON FINDINGS: The examined terminal ileum appeared to be normal. , Three sessile polyps ranging from 5 to 61mm in size were found in the rectum(1), sigmoid colon, and descending colon.  A polypectomy was performed using snare cautery.  , A sessile polyp ranging from 3 to 65mm in size was found in the RECTOsigmoid colon(1).  A polypectomy was performed with cold forceps.  , The colon was redundant.  Manual abdominal counter-pressure was used to reach the cecum, and Small internal hemorrhoids were found.  PREP QUALITY: good.  CECAL W/D TIME: 18 MINS          COMPLICATIONS: None  ENDOSCOPIC IMPRESSION: 1.   RECTAL BLEEDING DUE TO SMALL INTERNAL HEMORRHOIDS 2.   4 COLON POLYPS REMOVED 3.   The  LEFT colon IS redundant 4.   Small internal hemorrhoids  RECOMMENDATIONS: Next colonoscopy in 5-10 years WITH AN OVERTUBE. FOLLOW A HIGH FIBER DIET. BIOPSY RESULTS SHOULD BE BACK IN 14 DAYS.    _______________________________ Lorrin MaisBarney Drain, MD 12/27/13 10:21 AM Revised: 12/27/2013 10:21 AM  CPT CODES:     45385@Colonoscopy , flexible, proximal to splenic flexure; with removal of tumor(s), polyp(s), or other lesion(s) by snare technique ICD CODES:  The ICD and CPT codes recommended by this software are interpretations from the data that the clinical staff has captured with the software.  The verification of the translation of this report to the ICD and CPT codes and modifiers is the sole responsibility of the health care institution and practicing physician where this report was generated.  [facility] will not be held responsible for the validity of the ICD and CPT codes included on this report.  AMA assumes no liability for data contained or not contained herein. CPT is a Designer, television/film set of the [employer].

## 2013-12-20 ENCOUNTER — Encounter (HOSPITAL_COMMUNITY): Payer: Self-pay | Admitting: Gastroenterology

## 2014-01-07 ENCOUNTER — Telehealth: Payer: Self-pay | Admitting: Gastroenterology

## 2014-01-07 NOTE — Telephone Encounter (Signed)
Please call pt. She had ONE simple adenoma AND 3 HYPERPLASTIC POLYPS removed. FOLLOW A HIGH FIBER DIET. TCS IN 5 YEARS WITH AN OVERTUBE.

## 2014-01-07 NOTE — Telephone Encounter (Signed)
Pt is aware of results and would like a high fiber diet mailed out to her,which I will do.

## 2014-01-09 ENCOUNTER — Other Ambulatory Visit (HOSPITAL_COMMUNITY): Payer: Self-pay | Admitting: Respiratory Therapy

## 2014-01-09 DIAGNOSIS — G4730 Sleep apnea, unspecified: Secondary | ICD-10-CM

## 2014-01-19 ENCOUNTER — Emergency Department (HOSPITAL_COMMUNITY): Payer: Medicaid Other

## 2014-01-19 ENCOUNTER — Encounter (HOSPITAL_COMMUNITY): Payer: Self-pay | Admitting: *Deleted

## 2014-01-19 ENCOUNTER — Inpatient Hospital Stay (HOSPITAL_COMMUNITY)
Admission: EM | Admit: 2014-01-19 | Discharge: 2014-01-22 | DRG: 191 | Disposition: A | Discharge status: Home / Self Care | Payer: Medicaid Other | Attending: Internal Medicine | Admitting: Internal Medicine

## 2014-01-19 DIAGNOSIS — Z8249 Family history of ischemic heart disease and other diseases of the circulatory system: Secondary | ICD-10-CM

## 2014-01-19 DIAGNOSIS — J45909 Unspecified asthma, uncomplicated: Secondary | ICD-10-CM | POA: Diagnosis present

## 2014-01-19 DIAGNOSIS — Z833 Family history of diabetes mellitus: Secondary | ICD-10-CM

## 2014-01-19 DIAGNOSIS — Z79899 Other long term (current) drug therapy: Secondary | ICD-10-CM

## 2014-01-19 DIAGNOSIS — I429 Cardiomyopathy, unspecified: Secondary | ICD-10-CM | POA: Diagnosis present

## 2014-01-19 DIAGNOSIS — R05 Cough: Secondary | ICD-10-CM

## 2014-01-19 DIAGNOSIS — K219 Gastro-esophageal reflux disease without esophagitis: Secondary | ICD-10-CM | POA: Diagnosis present

## 2014-01-19 DIAGNOSIS — F17210 Nicotine dependence, cigarettes, uncomplicated: Secondary | ICD-10-CM | POA: Diagnosis present

## 2014-01-19 DIAGNOSIS — J449 Chronic obstructive pulmonary disease, unspecified: Secondary | ICD-10-CM | POA: Diagnosis present

## 2014-01-19 DIAGNOSIS — Z8673 Personal history of transient ischemic attack (TIA), and cerebral infarction without residual deficits: Secondary | ICD-10-CM

## 2014-01-19 DIAGNOSIS — I5022 Chronic systolic (congestive) heart failure: Secondary | ICD-10-CM | POA: Diagnosis present

## 2014-01-19 DIAGNOSIS — I10 Essential (primary) hypertension: Secondary | ICD-10-CM | POA: Diagnosis present

## 2014-01-19 DIAGNOSIS — Z7952 Long term (current) use of systemic steroids: Secondary | ICD-10-CM

## 2014-01-19 DIAGNOSIS — F419 Anxiety disorder, unspecified: Secondary | ICD-10-CM | POA: Diagnosis present

## 2014-01-19 DIAGNOSIS — E039 Hypothyroidism, unspecified: Secondary | ICD-10-CM | POA: Diagnosis present

## 2014-01-19 DIAGNOSIS — Z95810 Presence of automatic (implantable) cardiac defibrillator: Secondary | ICD-10-CM

## 2014-01-19 DIAGNOSIS — J441 Chronic obstructive pulmonary disease with (acute) exacerbation: Principal | ICD-10-CM | POA: Diagnosis present

## 2014-01-19 DIAGNOSIS — G629 Polyneuropathy, unspecified: Secondary | ICD-10-CM | POA: Diagnosis present

## 2014-01-19 DIAGNOSIS — Z720 Tobacco use: Secondary | ICD-10-CM | POA: Diagnosis present

## 2014-01-19 DIAGNOSIS — E785 Hyperlipidemia, unspecified: Secondary | ICD-10-CM | POA: Diagnosis present

## 2014-01-19 DIAGNOSIS — R059 Cough, unspecified: Secondary | ICD-10-CM

## 2014-01-19 DIAGNOSIS — Z7982 Long term (current) use of aspirin: Secondary | ICD-10-CM

## 2014-01-19 DIAGNOSIS — M1990 Unspecified osteoarthritis, unspecified site: Secondary | ICD-10-CM | POA: Diagnosis present

## 2014-01-19 LAB — CBC WITH DIFFERENTIAL/PLATELET
Basophils Absolute: 0 10*3/uL (ref 0.0–0.1)
Basophils Relative: 0 % (ref 0–1)
EOS PCT: 3 % (ref 0–5)
Eosinophils Absolute: 0.3 10*3/uL (ref 0.0–0.7)
HCT: 43.9 % (ref 36.0–46.0)
HEMOGLOBIN: 14.4 g/dL (ref 12.0–15.0)
LYMPHS ABS: 1.8 10*3/uL (ref 0.7–4.0)
LYMPHS PCT: 15 % (ref 12–46)
MCH: 30.4 pg (ref 26.0–34.0)
MCHC: 32.8 g/dL (ref 30.0–36.0)
MCV: 92.8 fL (ref 78.0–100.0)
MONOS PCT: 7 % (ref 3–12)
Monocytes Absolute: 0.9 10*3/uL (ref 0.1–1.0)
Neutro Abs: 8.5 10*3/uL — ABNORMAL HIGH (ref 1.7–7.7)
Neutrophils Relative %: 75 % (ref 43–77)
PLATELETS: 231 10*3/uL (ref 150–400)
RBC: 4.73 MIL/uL (ref 3.87–5.11)
RDW: 14 % (ref 11.5–15.5)
WBC: 11.5 10*3/uL — AB (ref 4.0–10.5)

## 2014-01-19 LAB — BASIC METABOLIC PANEL
Anion gap: 11 (ref 5–15)
BUN: 8 mg/dL (ref 6–23)
CHLORIDE: 100 meq/L (ref 96–112)
CO2: 29 mEq/L (ref 19–32)
Calcium: 9 mg/dL (ref 8.4–10.5)
Creatinine, Ser: 0.86 mg/dL (ref 0.50–1.10)
GFR calc non Af Amer: 76 mL/min — ABNORMAL LOW (ref >90)
GFR, EST AFRICAN AMERICAN: 88 mL/min — AB (ref >90)
Glucose, Bld: 100 mg/dL — ABNORMAL HIGH (ref 70–99)
POTASSIUM: 3.8 meq/L (ref 3.7–5.3)
SODIUM: 140 meq/L (ref 137–147)

## 2014-01-19 LAB — PRO B NATRIURETIC PEPTIDE: Pro B Natriuretic peptide (BNP): 196.1 pg/mL — ABNORMAL HIGH (ref 0–125)

## 2014-01-19 LAB — I-STAT TROPONIN, ED: TROPONIN I, POC: 0 ng/mL (ref 0.00–0.08)

## 2014-01-19 MED ORDER — POTASSIUM CHLORIDE CRYS ER 20 MEQ PO TBCR
40.0000 meq | EXTENDED_RELEASE_TABLET | Freq: Two times a day (BID) | ORAL | Status: DC
  Administered 2014-01-19 – 2014-01-22 (×7): 40 meq via ORAL
  Filled 2014-01-19 (×2): qty 2
  Filled 2014-01-19: qty 4
  Filled 2014-01-19 (×4): qty 2

## 2014-01-19 MED ORDER — ALBUTEROL (5 MG/ML) CONTINUOUS INHALATION SOLN
10.0000 mg/h | INHALATION_SOLUTION | Freq: Once | RESPIRATORY_TRACT | Status: AC
Start: 2014-01-19 — End: 2014-01-19
  Administered 2014-01-19: 10 mg/h via RESPIRATORY_TRACT
  Filled 2014-01-19: qty 20

## 2014-01-19 MED ORDER — BUDESONIDE-FORMOTEROL FUMARATE 80-4.5 MCG/ACT IN AERO
2.0000 | INHALATION_SPRAY | Freq: Two times a day (BID) | RESPIRATORY_TRACT | Status: DC
  Administered 2014-01-19 – 2014-01-22 (×6): 2 via RESPIRATORY_TRACT
  Filled 2014-01-19: qty 6.9

## 2014-01-19 MED ORDER — LEVOFLOXACIN 500 MG PO TABS
500.0000 mg | ORAL_TABLET | Freq: Every day | ORAL | Status: DC
Start: 2014-01-19 — End: 2014-01-22
  Administered 2014-01-19 – 2014-01-22 (×4): 500 mg via ORAL
  Filled 2014-01-19 (×4): qty 1

## 2014-01-19 MED ORDER — ASPIRIN EC 81 MG PO TBEC
81.0000 mg | DELAYED_RELEASE_TABLET | Freq: Every day | ORAL | Status: DC
  Administered 2014-01-19 – 2014-01-22 (×4): 81 mg via ORAL
  Filled 2014-01-19 (×4): qty 1

## 2014-01-19 MED ORDER — DICLOFENAC SODIUM 1 % TD GEL
2.0000 g | Freq: Four times a day (QID) | TRANSDERMAL | Status: DC | PRN
  Filled 2014-01-19: qty 100

## 2014-01-19 MED ORDER — SODIUM CHLORIDE 0.9 % IV SOLN: 250.0000 mL | INTRAVENOUS | Status: DC | PRN

## 2014-01-19 MED ORDER — IPRATROPIUM-ALBUTEROL 0.5-2.5 (3) MG/3ML IN SOLN
3.0000 mL | RESPIRATORY_TRACT | Status: DC
  Administered 2014-01-19 – 2014-01-22 (×16): 3 mL via RESPIRATORY_TRACT
  Filled 2014-01-19 (×16): qty 3

## 2014-01-19 MED ORDER — IPRATROPIUM-ALBUTEROL 0.5-2.5 (3) MG/3ML IN SOLN
3.0000 mL | Freq: Once | RESPIRATORY_TRACT | Status: AC
  Administered 2014-01-19: 3 mL via RESPIRATORY_TRACT
  Filled 2014-01-19: qty 3

## 2014-01-19 MED ORDER — FUROSEMIDE 40 MG PO TABS
40.0000 mg | ORAL_TABLET | Freq: Two times a day (BID) | ORAL | Status: DC
  Administered 2014-01-19 – 2014-01-22 (×6): 40 mg via ORAL
  Filled 2014-01-19 (×6): qty 1

## 2014-01-19 MED ORDER — GABAPENTIN 300 MG PO CAPS
300.0000 mg | ORAL_CAPSULE | Freq: Three times a day (TID) | ORAL | Status: DC
  Administered 2014-01-19 – 2014-01-22 (×10): 300 mg via ORAL
  Filled 2014-01-19 (×10): qty 1

## 2014-01-19 MED ORDER — PRAVASTATIN SODIUM 40 MG PO TABS
40.0000 mg | ORAL_TABLET | Freq: Every day | ORAL | Status: DC
  Administered 2014-01-19 – 2014-01-21 (×3): 40 mg via ORAL
  Filled 2014-01-19 (×3): qty 1

## 2014-01-19 MED ORDER — METHYLPREDNISOLONE SODIUM SUCC 125 MG IJ SOLR
60.0000 mg | Freq: Two times a day (BID) | INTRAMUSCULAR | Status: DC
  Administered 2014-01-19 – 2014-01-21 (×6): 60 mg via INTRAVENOUS
  Filled 2014-01-19 (×6): qty 2

## 2014-01-19 MED ORDER — ACETAMINOPHEN 325 MG PO TABS: 650.0000 mg | ORAL_TABLET | Freq: Four times a day (QID) | ORAL | Status: DC | PRN

## 2014-01-19 MED ORDER — LISINOPRIL 10 MG PO TABS
10.0000 mg | ORAL_TABLET | Freq: Every day | ORAL | Status: DC
  Administered 2014-01-19 – 2014-01-22 (×4): 10 mg via ORAL
  Filled 2014-01-19 (×4): qty 1

## 2014-01-19 MED ORDER — SODIUM CHLORIDE 0.9 % IJ SOLN
3.0000 mL | Freq: Two times a day (BID) | INTRAMUSCULAR | Status: DC
  Administered 2014-01-19 – 2014-01-21 (×6): 3 mL via INTRAVENOUS

## 2014-01-19 MED ORDER — SODIUM CHLORIDE 0.9 % IJ SOLN: 3.0000 mL | INTRAMUSCULAR | Status: DC | PRN

## 2014-01-19 MED ORDER — FAMOTIDINE 20 MG PO TABS
20.0000 mg | ORAL_TABLET | Freq: Two times a day (BID) | ORAL | Status: DC
  Administered 2014-01-19 – 2014-01-22 (×7): 20 mg via ORAL
  Filled 2014-01-19 (×7): qty 1

## 2014-01-19 MED ORDER — CARVEDILOL 12.5 MG PO TABS
25.0000 mg | ORAL_TABLET | Freq: Two times a day (BID) | ORAL | Status: DC
  Administered 2014-01-19 – 2014-01-22 (×6): 25 mg via ORAL
  Filled 2014-01-19 (×7): qty 2

## 2014-01-19 MED ORDER — PREDNISONE 50 MG PO TABS
60.0000 mg | ORAL_TABLET | Freq: Once | ORAL | Status: AC
  Administered 2014-01-19: 60 mg via ORAL
  Filled 2014-01-19 (×2): qty 1

## 2014-01-19 MED ORDER — ONDANSETRON HCL 4 MG PO TABS: 4.0000 mg | ORAL_TABLET | Freq: Four times a day (QID) | ORAL | Status: DC | PRN

## 2014-01-19 MED ORDER — ENOXAPARIN SODIUM 40 MG/0.4ML ~~LOC~~ SOLN
40.0000 mg | SUBCUTANEOUS | Status: DC
  Administered 2014-01-19 – 2014-01-21 (×3): 40 mg via SUBCUTANEOUS
  Filled 2014-01-19 (×3): qty 0.4

## 2014-01-19 MED ORDER — ACETAMINOPHEN 650 MG RE SUPP: 650.0000 mg | Freq: Four times a day (QID) | RECTAL | Status: DC | PRN

## 2014-01-19 MED ORDER — GUAIFENESIN ER 600 MG PO TB12
1200.0000 mg | ORAL_TABLET | Freq: Two times a day (BID) | ORAL | Status: DC
  Administered 2014-01-19 – 2014-01-22 (×7): 1200 mg via ORAL
  Filled 2014-01-19 (×7): qty 2

## 2014-01-19 MED ORDER — LEVOTHYROXINE SODIUM 25 MCG PO TABS
25.0000 ug | ORAL_TABLET | Freq: Every day | ORAL | Status: DC
  Administered 2014-01-20 – 2014-01-22 (×3): 25 ug via ORAL
  Filled 2014-01-19 (×4): qty 1

## 2014-01-19 MED ORDER — DIPHENHYDRAMINE HCL 25 MG PO CAPS
25.0000 mg | ORAL_CAPSULE | Freq: Every day | ORAL | Status: DC
  Administered 2014-01-19 – 2014-01-22 (×4): 25 mg via ORAL
  Filled 2014-01-19 (×4): qty 1

## 2014-01-19 MED ORDER — ALBUTEROL SULFATE (2.5 MG/3ML) 0.083% IN NEBU
2.5000 mg | INHALATION_SOLUTION | Freq: Once | RESPIRATORY_TRACT | Status: AC
  Administered 2014-01-19: 2.5 mg via RESPIRATORY_TRACT
  Filled 2014-01-19: qty 3

## 2014-01-19 MED ORDER — LEVOTHYROXINE SODIUM 100 MCG PO TABS
200.0000 ug | ORAL_TABLET | Freq: Every day | ORAL | Status: DC
  Administered 2014-01-19 – 2014-01-22 (×4): 200 ug via ORAL
  Filled 2014-01-19 (×4): qty 2

## 2014-01-19 MED ORDER — ONDANSETRON HCL 4 MG/2ML IJ SOLN: 4.0000 mg | Freq: Four times a day (QID) | INTRAMUSCULAR | Status: DC | PRN

## 2014-01-19 NOTE — ED Notes (Signed)
Pt c/o productive cough x 3 days and pt states she feels sob; pt states she has been coughing up yellow sputum

## 2014-01-19 NOTE — Progress Notes (Signed)
Patient started on cat nebulizer, audible wheezes.

## 2014-01-19 NOTE — ED Notes (Signed)
RT paged.

## 2014-01-19 NOTE — H&P (Signed)
[facility] History and Physical  Melanie Cordova Cordova:[id number] DOB: March 04, 1961 DOA: 01/19/2014  Referring physician: Dr. Christy Gentles PCP: Rosita Fire, MD   Chief Complaint: shortness of breath  HPI: Melanie Cordova is a 53 y.o. female with history of tobacco use and COPD, presents to the emergency room with complaints of shortness of breath. Patient reports that her symptoms occur approximately 3-4 days ago. They've progressively been getting worse. She has a productive cough with dark yellow color sputum. She has also noticed worsening wheezing and heaviness in her chest. She is unsure whether she's had any fevers. No nausea vomiting or diarrhea. Patient was evaluated in the emergency room and received bronchodilators and steroids. She felt that her respiratory status has not returned to baseline and therefore she was referred for admission for treatment of her COPD.   Review of Systems:  Pertinent positives as per HPI, otherwise negative  Past Medical History  Diagnosis Date  . Hypertension     09/2010-normal CMet and CBC; Lipid profile-116, 88, 25, 73  . Cardiomyopathy 08/2010    Presented with congestive heart failure; EF of 15% and 2012; hypotension on medication precludes optimal dosing  . Gastroesophageal reflux disease   . Pneumonia   . Obesity   . Hyperlipidemia   . Tobacco abuse     20 pack years  . CHF (congestive heart failure)   . Asthma   . Shortness of breath   . Headache(784.0)   . Arthritis   . Anxiety   . Neuropathy   . 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 Chronic systolic heart failure   . Dysrhythmia   . Automatic implantable cardioverter-defibrillator in situ     2013, july  . COPD (chronic obstructive pulmonary disease)     2013  . Bell palsy     states has had 3 episodes  . Stroke      stroke in 07/2012 and another in June, 2014  . Seizures     last one at age 67  . Neuromuscular disorder     Neuropathy, right  foot and leg  . Hypothyroidism     Recent TSH was normal.   Past Surgical History  Procedure Laterality Date  . Cesarean section      X2  . Tee without cardioversion N/A 07/11/2012    Procedure: TRANSESOPHAGEAL ECHOCARDIOGRAM (TEE);  Surgeon: Thayer Headings, MD;  Location: [place];  Service: Cardiovascular;  Laterality: N/A;  . Tubal ligation    . Multiple extractions with alveoloplasty N/A 09/24/2012    Procedure: MULTIPLE EXTRACION #2, 4, 6, 7 ,8, 9, 11, 13, 18, 20, 21, 22, 23, 24, 25, 26, 27, 29 WITH ALVEOLOPLASTY, BIOPSY OF PALATE LESION, REMOVA RIGHT LINGUAL TORUS;  Surgeon: Gae Bon, DDS;  Location: [place];  Service: Oral Surgery;  Laterality: N/A;  . Ep implantable device      St. Jude  . Colonoscopy N/A 12/16/2013    Procedure: COLONOSCOPY;  Surgeon: Danie Binder, MD;  [facility];  Service: Endoscopy;  Laterality: N/A;  10:45-moved to 10/5 @ [place] notified pt   Social History:  reports that she has been smoking Cigarettes.  She has a 10.5 pack-year smoking history. She has never used smokeless tobacco. She reports that she does not drink alcohol or use illicit drugs.  Allergies  Allergen Reactions  . Fish Allergy Anaphylaxis  . Iodinated Diagnostic Agents Hives and Other (See Comments)    Pulmonary problems; no  frank respiratory arrest  . Iodine Hives and Other (See Comments)    Pulmonary Problems   . Lentil Anaphylaxis  . Penicillins Anaphylaxis and Shortness Of Breath    Hair loss  . Lipitor [Atorvastatin] Itching and Rash  . Spironolactone Rash  . Sulfa Antibiotics Other (See Comments)    Unknown    Family History  Problem Relation Age of Onset  . Cardiomyopathy Mother     ICD pacemaker-ischmic CM  . Heart failure Mother   . Hypertension Mother   . Diabetes Mother   . Thyroid disease Mother   . Cardiomyopathy Father     Deceased  . Coronary artery disease Father   . Heart failure Father   . Heart failure Brother   .  Hypertension Brother   . Endometriosis Sister   . Colon cancer Neg Hx   . Liver disease Neg Hx      Prior to Admission medications   Medication Sig Start Date End Date Taking? Authorizing Provider  albuterol (PROVENTIL HFA;VENTOLIN HFA) 108 (90 BASE) MCG/ACT inhaler Inhale 2 puffs into the lungs every 6 (six) hours as needed for wheezing or shortness of breath. 07/12/12  Yes Tonia Brooms, MD  aspirin EC 81 MG tablet Take 81 mg by mouth daily.   Yes Historical Provider, MD  budesonide-formoterol (SYMBICORT) 80-4.5 MCG/ACT inhaler Inhale 2 puffs into the lungs 2 (two) times daily.   Yes Historical Provider, MD  carvedilol (COREG) 12.5 MG tablet Take 25 mg by mouth 2 (two) times daily with a meal.   Yes Historical Provider, MD  desloratadine (CLARINEX) 5 MG tablet Take 5 mg by mouth daily.   Yes Historical Provider, MD  diclofenac sodium (VOLTAREN) 1 % GEL Apply 2 g topically 4 (four) times daily as needed (pain).    Yes Historical Provider, MD  diphenhydrAMINE (BENADRYL) 25 mg capsule Take 25 mg by mouth daily.    Historical Provider, MD  ferrous sulfate (IRON SUPPLEMENT) 325 (65 FE) MG tablet Take 325 mg by mouth daily with breakfast.    Historical Provider, MD  furosemide (LASIX) 40 MG tablet Take 1 tablet (40 mg total) by mouth 2 (two) times daily. 08/29/12   Yehuda Savannah, MD  gabapentin (NEURONTIN) 300 MG capsule Take 300 mg by mouth 3 (three) times daily.  02/14/12   Yehuda Savannah, MD  levothyroxine (SYNTHROID, LEVOTHROID) 200 MCG tablet Take 200 mcg by mouth daily before breakfast. Takes with 56mcg to equal 215mcg    Historical Provider, MD  levothyroxine (SYNTHROID, LEVOTHROID) 25 MCG tablet Take 25 mcg by mouth daily before breakfast. Takes with 239mcg to equal 250mcg    Historical Provider, MD  lisinopril (PRINIVIL,ZESTRIL) 10 MG tablet Take 1 tablet (10 mg total) by mouth daily. 08/29/12   Yehuda Savannah, MD  lovastatin (MEVACOR) 40 MG tablet Take 1 tablet (40 mg total) by mouth  at bedtime. 08/29/12   Yehuda Savannah, MD  Naproxen Sodium (ALEVE PO) Take 2 tablets by mouth at bedtime as needed (for pain).    Historical Provider, MD  oxymetazoline (AFRIN) 0.05 % nasal spray Place 2 sprays into the nose daily as needed for congestion.    Historical Provider, MD  potassium chloride SA (K-DUR,KLOR-CON) 20 MEQ tablet Take 2 tablets (40 mEq total) by mouth 2 (two) times daily. 08/29/12   Yehuda Savannah, MD  ranitidine (ZANTAC) 150 MG tablet Take 150 mg by mouth daily.     Historical Provider, MD  tiotropium (SPIRIVA) 18 MCG  inhalation capsule Place 18 mcg into inhaler and inhale daily.    Historical Provider, MD   Physical Exam: Filed Vitals:   01/19/14 7915 01/19/14 0713 01/19/14 0814 01/19/14 0827  BP:  126/80 125/71 100/68  Pulse:  94 91 89  Temp:    98.4 F (36.9 C)  TempSrc:    Oral  Resp:  20 23   Height:    5' 7.5" (1.715 m)  Weight:    133.981 kg (295 lb 6 oz)  SpO2: 98% 98% 96% 95%    Wt Readings from Last 3 Encounters:  01/19/14 133.981 kg (295 lb 6 oz)  12/16/13 130.182 kg (287 lb)  11/11/13 129.275 kg (285 lb)    General:  Appears calm and comfortable Eyes: PERRL, normal lids, irises & conjunctiva ENT: grossly normal hearing, lips & tongue Neck: no LAD, masses or thyromegaly Cardiovascular: RRR, no m/r/g. No LE edema. Telemetry: SR, no arrhythmias  Respiratory: diminished breath sounds bilaterally with no wheezing. Normal respiratory effort. Abdomen: soft, ntnd Skin: no rash or induration seen on limited exam Musculoskeletal: grossly normal tone BUE/BLE Psychiatric: grossly normal mood and affect, speech fluent and appropriate Neurologic: grossly non-focal.          Labs on Admission:  Basic Metabolic Panel:  Recent Labs Lab 01/19/14 0519  NA 140  K 3.8  CL 100  CO2 29  GLUCOSE 100*  BUN 8  CREATININE 0.86  CALCIUM 9.0   Liver Function Tests: No results for input(s): AST, ALT, ALKPHOS, BILITOT, PROT, ALBUMIN in the last 168  hours. No results for input(s): LIPASE, AMYLASE in the last 168 hours. No results for input(s): AMMONIA in the last 168 hours. CBC:  Recent Labs Lab 01/19/14 0519  WBC 11.5*  NEUTROABS 8.5*  HGB 14.4  HCT 43.9  MCV 92.8  PLT 231   Cardiac Enzymes: No results for input(s): CKTOTAL, CKMB, CKMBINDEX, TROPONINI in the last 168 hours.  BNP (last 3 results)  Recent Labs  06/21/13 0228 01/19/14 0519  PROBNP 81.7 196.1*   CBG: No results for input(s): GLUCAP in the last 168 hours.  Radiological Exams on Admission: Dg Chest 2 View  01/19/2014   CLINICAL DATA:  Productive cough for 3 days.  Shortness of breath.  EXAM: CHEST  2 VIEW  COMPARISON:  06/21/2013  FINDINGS: Cardiac pacemaker. The heart size and mediastinal contours are within normal limits. Both lungs are clear. The visualized skeletal structures are unremarkable.  IMPRESSION: No active cardiopulmonary disease.   Electronically Signed   By: Lucienne Capers M.D.   On: 01/19/2014 05:10    EKG: Independently reviewed. No acute findings  Assessment/Plan Principal Problem:   COPD exacerbation Active Problems:   Hypertension   Hypothyroidism   Tobacco abuse   Chronic systolic heart failure   COPD with acute exacerbation   1. COPD exacerbation. Continue bronchodilators, will start Solu-Medrol and given Levaquin. Start on Mucinex and encourage flutter valve. 2. Chronic systolic congestive heart failure. Appears to be compensated. Continue outpatient dose of Lasix. 3. Hypothyroidism. Continue Synthroid 4. Tobacco use. Counseled on the importance of tobacco cessation. She is declining a nicotine patch. 5. Essential hypertension. Continue outpatient regimen.  Code Status: full code DVT Prophylaxis: lovenox Family Communication: discussed with patient Disposition Plan: discharge home once improved, likely in am  Time spent: 58mins  MEMON,JEHANZEB [facility] Pager [phone]

## 2014-01-19 NOTE — ED Provider Notes (Signed)
CSN: [id number]     Arrival date & time 01/19/14  4010 History   First MD Initiated Contact with Patient 01/19/14 0449     Chief Complaint  Patient presents with  . Cough     Patient is a 53 y.o. female presenting with cough.  Cough Cough characteristics:  Productive Sputum characteristics:  Yellow Severity:  Moderate Onset quality:  Gradual Duration:  3 days Timing:  Intermittent Progression:  Worsening Chronicity:  New Smoker: yes   Relieved by:  Nothing Worsened by:  Lying down Associated symptoms: chest pain, shortness of breath and wheezing   Associated symptoms: no fever   Patient reports cough/shortness of breath for 3 days Her SOB is not relieved by home albuterol She denies hemoptysis She reports chest heaviness that has been constant as her cough worsened She has ICD in place but no shocks  No new LE edema is reported  Past Medical History  Diagnosis Date  . Hypertension     09/2010-normal CMet and CBC; Lipid profile-116, 88, 25, 73  . Cardiomyopathy 08/2010    Presented with congestive heart failure; EF of 15% and 2012; hypotension on medication precludes optimal dosing  . Gastroesophageal reflux disease   . Pneumonia   . Obesity   . Hyperlipidemia   . Tobacco abuse     20 pack years  . CHF (congestive heart failure)   . Asthma   . Shortness of breath   . Headache(784.0)   . Arthritis   . Anxiety   . Neuropathy   . 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 Chronic systolic heart failure   . Dysrhythmia   . Automatic implantable cardioverter-defibrillator in situ     2013, july  . COPD (chronic obstructive pulmonary disease)     2013  . Bell palsy     states has had 3 episodes  . Stroke      stroke in 07/2012 and another in June, 2014  . Seizures     last one at age 15  . Neuromuscular disorder     Neuropathy, right foot and leg  . Hypothyroidism     Recent TSH was normal.   Past Surgical History  Procedure Laterality  Date  . Cesarean section      X2  . Tee without cardioversion N/A 07/11/2012    Procedure: TRANSESOPHAGEAL ECHOCARDIOGRAM (TEE);  Surgeon: Thayer Headings, MD;  Location: [place];  Service: Cardiovascular;  Laterality: N/A;  . Tubal ligation    . Multiple extractions with alveoloplasty N/A 09/24/2012    Procedure: MULTIPLE EXTRACION #2, 4, 6, 7 ,8, 9, 11, 13, 18, 20, 21, 22, 23, 24, 25, 26, 27, 29 WITH ALVEOLOPLASTY, BIOPSY OF PALATE LESION, REMOVA RIGHT LINGUAL TORUS;  Surgeon: Gae Bon, DDS;  Location: [place];  Service: Oral Surgery;  Laterality: N/A;  . Ep implantable device      St. Jude  . Colonoscopy N/A 12/16/2013    Procedure: COLONOSCOPY;  Surgeon: Danie Binder, MD;  [facility];  Service: Endoscopy;  Laterality: N/A;  10:45-moved to 10/5 @ [place] notified pt   Family History  Problem Relation Age of Onset  . Cardiomyopathy Mother     ICD pacemaker-ischmic CM  . Heart failure Mother   . Hypertension Mother   . Diabetes Mother   . Thyroid disease Mother   . Cardiomyopathy Father     Deceased  . Coronary artery disease Father   . Heart  failure Father   . Heart failure Brother   . Hypertension Brother   . Endometriosis Sister   . Colon cancer Neg Hx   . Liver disease Neg Hx    History  Substance Use Topics  . Smoking status: Current Every Day Smoker -- 0.25 packs/day for 21 years    Types: Cigarettes  . Smokeless tobacco: Never Used     Comment: 5 ciggs daily 11-26-12  . Alcohol Use: No   OB History    No data available     Review of Systems  Constitutional: Negative for fever.  Respiratory: Positive for cough, shortness of breath and wheezing.   Cardiovascular: Positive for chest pain. Negative for leg swelling.  Gastrointestinal: Negative for vomiting.  All other systems reviewed and are negative.     Allergies  Fish allergy; Iodinated diagnostic agents; Iodine; Lentil; Penicillins; Lipitor; Spironolactone; and Sulfa  antibiotics  Home Medications   Prior to Admission medications   Medication Sig Start Date End Date Taking? Authorizing Provider  albuterol (PROVENTIL HFA;VENTOLIN HFA) 108 (90 BASE) MCG/ACT inhaler Inhale 2 puffs into the lungs every 6 (six) hours as needed for wheezing or shortness of breath. 07/12/12   Tonia Brooms, MD  aspirin EC 81 MG tablet Take 81 mg by mouth daily.    Historical Provider, MD  budesonide-formoterol (SYMBICORT) 80-4.5 MCG/ACT inhaler Inhale 2 puffs into the lungs 2 (two) times daily.    Historical Provider, MD  carvedilol (COREG) 12.5 MG tablet Take 25 mg by mouth 2 (two) times daily with a meal.    Historical Provider, MD  desloratadine (CLARINEX) 5 MG tablet Take 5 mg by mouth daily.    Historical Provider, MD  diclofenac sodium (VOLTAREN) 1 % GEL Apply 2 g topically 4 (four) times daily as needed (pain).     Historical Provider, MD  diphenhydrAMINE (BENADRYL) 25 mg capsule Take 25 mg by mouth daily.    Historical Provider, MD  ferrous sulfate (IRON SUPPLEMENT) 325 (65 FE) MG tablet Take 325 mg by mouth daily with breakfast.    Historical Provider, MD  furosemide (LASIX) 40 MG tablet Take 1 tablet (40 mg total) by mouth 2 (two) times daily. 08/29/12   Yehuda Savannah, MD  gabapentin (NEURONTIN) 300 MG capsule Take 300 mg by mouth 3 (three) times daily.  02/14/12   Yehuda Savannah, MD  levothyroxine (SYNTHROID, LEVOTHROID) 200 MCG tablet Take 200 mcg by mouth daily before breakfast. Takes with 69mcg to equal 277mcg    Historical Provider, MD  levothyroxine (SYNTHROID, LEVOTHROID) 25 MCG tablet Take 25 mcg by mouth daily before breakfast. Takes with 236mcg to equal 228mcg    Historical Provider, MD  lisinopril (PRINIVIL,ZESTRIL) 10 MG tablet Take 1 tablet (10 mg total) by mouth daily. 08/29/12   Yehuda Savannah, MD  lovastatin (MEVACOR) 40 MG tablet Take 1 tablet (40 mg total) by mouth at bedtime. 08/29/12   Yehuda Savannah, MD  Naproxen Sodium (ALEVE PO) Take 2 tablets  by mouth at bedtime as needed (for pain).    Historical Provider, MD  oxymetazoline (AFRIN) 0.05 % nasal spray Place 2 sprays into the nose daily as needed for congestion.    Historical Provider, MD  potassium chloride SA (K-DUR,KLOR-CON) 20 MEQ tablet Take 2 tablets (40 mEq total) by mouth 2 (two) times daily. 08/29/12   Yehuda Savannah, MD  ranitidine (ZANTAC) 150 MG tablet Take 150 mg by mouth daily.     Historical Provider, MD  tiotropium (  SPIRIVA) 18 MCG inhalation capsule Place 18 mcg into inhaler and inhale daily.    Historical Provider, MD   BP 148/89 mmHg  Pulse 102  Temp(Src) 97.9 F (36.6 C) (Oral)  Resp 15  SpO2 97% Physical Exam CONSTITUTIONAL: Well developed/well nourished, coughs frequently during exam HEAD: Normocephalic/atraumatic EYES: EOMI/PERRL ENMT: Mucous membranes moist NECK: supple no meningeal signs SPINE:entire spine nontender CV: S1/S2 noted, no murmurs/rubs/gallops noted LUNGS: coarse wheezing bilaterally.   ABDOMEN: soft, nontender, no rebound or guarding GU:no cva tenderness NEURO: Pt is awake/alert, moves all extremitiesx4 EXTREMITIES: pulses normal, full ROM SKIN: warm, color normal PSYCH: no abnormalities of mood noted  ED Course  Procedures  5:14 AM Pt with cough/wheeze Nebs/prednisone ordered 5:59 AM Pt still with wheeze Will order another nebulizer treatment 7:16 AM Pt continues to wheeze On ambulation, pt is tachypneic with fatigue.  She still has course wheezing Pt would benefit from admission/monitoring and continued treatment of her COPD BP 126/80 mmHg  Pulse 94  Temp(Src) 97.9 F (36.6 C) (Oral)  Resp 20  SpO2 98% Labs Review Labs Reviewed  CBC WITH DIFFERENTIAL - Abnormal; Notable for the following:    WBC 11.5 (*)    Neutro Abs 8.5 (*)    All other components within normal limits  PRO B NATRIURETIC PEPTIDE - Abnormal; Notable for the following:    Pro B Natriuretic peptide (BNP) 196.1 (*)    All other components within  normal limits  BASIC METABOLIC PANEL - Abnormal; Notable for the following:    Glucose, Bld 100 (*)    GFR calc non Af Amer 76 (*)    GFR calc Af Amer 88 (*)    All other components within normal limits  Randolm Idol, ED    Imaging Review Dg Chest 2 View  01/19/2014   CLINICAL DATA:  Productive cough for 3 days.  Shortness of breath.  EXAM: CHEST  2 VIEW  COMPARISON:  06/21/2013  FINDINGS: Cardiac pacemaker. The heart size and mediastinal contours are within normal limits. Both lungs are clear. The visualized skeletal structures are unremarkable.  IMPRESSION: No active cardiopulmonary disease.   Electronically Signed   By: Lucienne Capers M.D.   On: 01/19/2014 05:10     EKG Interpretation   Date/Time:  Sunday January 19 2014 05:05:23 EST Ventricular Rate:  95 PR Interval:  141 QRS Duration: 114 QT Interval:  371 QTC Calculation: 466 R Axis:   -22 Text Interpretation:  Sinus rhythm Borderline intraventricular conduction  delay Low voltage, precordial leads Baseline wander in lead(s) V3 V4  Abnormal ekg No significant change since last tracing Confirmed by  [place] ([id number]) on 01/19/2014 5:11:48 AM      MDM   Final diagnoses:  COPD exacerbation    Nursing notes including past medical history and social history reviewed and considered in documentation xrays reviewed and considered Labs/vital reviewed and considered     Sharyon Cable, MD 01/19/14 [phone]

## 2014-01-19 NOTE — Plan of Care (Signed)
Problem: Consults Goal: Respiratory Problems Patient Education See Patient Education Module for education specifics.  Outcome: Completed/Met Date Met:  01/19/14

## 2014-01-19 NOTE — Plan of Care (Signed)
Problem: Consults Goal: COPD Patient Education (See Patient Education Module for education specifics.)  Outcome: Progressing     

## 2014-01-19 NOTE — ED Notes (Signed)
EDP at bedside  

## 2014-01-20 DIAGNOSIS — I5022 Chronic systolic (congestive) heart failure: Secondary | ICD-10-CM | POA: Diagnosis present

## 2014-01-20 DIAGNOSIS — Z7982 Long term (current) use of aspirin: Secondary | ICD-10-CM | POA: Diagnosis not present

## 2014-01-20 DIAGNOSIS — Z95810 Presence of automatic (implantable) cardiac defibrillator: Secondary | ICD-10-CM | POA: Diagnosis not present

## 2014-01-20 DIAGNOSIS — F419 Anxiety disorder, unspecified: Secondary | ICD-10-CM | POA: Diagnosis present

## 2014-01-20 DIAGNOSIS — E039 Hypothyroidism, unspecified: Secondary | ICD-10-CM | POA: Diagnosis present

## 2014-01-20 DIAGNOSIS — E785 Hyperlipidemia, unspecified: Secondary | ICD-10-CM | POA: Diagnosis present

## 2014-01-20 DIAGNOSIS — Z7952 Long term (current) use of systemic steroids: Secondary | ICD-10-CM | POA: Diagnosis not present

## 2014-01-20 DIAGNOSIS — R05 Cough: Secondary | ICD-10-CM | POA: Diagnosis present

## 2014-01-20 DIAGNOSIS — J45909 Unspecified asthma, uncomplicated: Secondary | ICD-10-CM | POA: Diagnosis present

## 2014-01-20 DIAGNOSIS — F17210 Nicotine dependence, cigarettes, uncomplicated: Secondary | ICD-10-CM | POA: Diagnosis present

## 2014-01-20 DIAGNOSIS — M1990 Unspecified osteoarthritis, unspecified site: Secondary | ICD-10-CM | POA: Diagnosis present

## 2014-01-20 DIAGNOSIS — Z8249 Family history of ischemic heart disease and other diseases of the circulatory system: Secondary | ICD-10-CM | POA: Diagnosis not present

## 2014-01-20 DIAGNOSIS — K219 Gastro-esophageal reflux disease without esophagitis: Secondary | ICD-10-CM | POA: Diagnosis present

## 2014-01-20 DIAGNOSIS — Z8673 Personal history of transient ischemic attack (TIA), and cerebral infarction without residual deficits: Secondary | ICD-10-CM | POA: Diagnosis not present

## 2014-01-20 DIAGNOSIS — G629 Polyneuropathy, unspecified: Secondary | ICD-10-CM | POA: Diagnosis present

## 2014-01-20 DIAGNOSIS — Z833 Family history of diabetes mellitus: Secondary | ICD-10-CM | POA: Diagnosis not present

## 2014-01-20 DIAGNOSIS — Z79899 Other long term (current) drug therapy: Secondary | ICD-10-CM | POA: Diagnosis not present

## 2014-01-20 DIAGNOSIS — I10 Essential (primary) hypertension: Secondary | ICD-10-CM | POA: Diagnosis present

## 2014-01-20 DIAGNOSIS — J441 Chronic obstructive pulmonary disease with (acute) exacerbation: Secondary | ICD-10-CM | POA: Diagnosis present

## 2014-01-20 DIAGNOSIS — I429 Cardiomyopathy, unspecified: Secondary | ICD-10-CM | POA: Diagnosis present

## 2014-01-20 LAB — CBC
HCT: 43.6 % (ref 36.0–46.0)
Hemoglobin: 14.2 g/dL (ref 12.0–15.0)
MCH: 30.1 pg (ref 26.0–34.0)
MCHC: 32.6 g/dL (ref 30.0–36.0)
MCV: 92.6 fL (ref 78.0–100.0)
Platelets: 283 10*3/uL (ref 150–400)
RBC: 4.71 MIL/uL (ref 3.87–5.11)
RDW: 13.9 % (ref 11.5–15.5)
WBC: 16.4 10*3/uL — AB (ref 4.0–10.5)

## 2014-01-20 LAB — BASIC METABOLIC PANEL
Anion gap: 11 (ref 5–15)
BUN: 9 mg/dL (ref 6–23)
CO2: 26 meq/L (ref 19–32)
Calcium: 9.5 mg/dL (ref 8.4–10.5)
Chloride: 103 mEq/L (ref 96–112)
Creatinine, Ser: 0.68 mg/dL (ref 0.50–1.10)
GFR calc Af Amer: >90 mL/min (ref >90)
GLUCOSE: 143 mg/dL — AB (ref 70–99)
POTASSIUM: 4.5 meq/L (ref 3.7–5.3)
SODIUM: 140 meq/L (ref 137–147)

## 2014-01-20 NOTE — Progress Notes (Signed)
Subjective: Patient was admitted due to cough, congestion, wheezing and shortness of breath. Patient has history of tobacco smoking. No fever or chills or chest pain.  Objective: Vital signs in last 24 hours: Temp:  [98 F (36.7 C)-98.4 F (36.9 C)] 98.4 F (36.9 C) (11/09 0524) Pulse Rate:  [89-105] 99 (11/09 0524) Resp:  [20-23] 20 (11/09 0524) BP: (92-133)/(46-77) 133/77 mmHg (11/09 0524) SpO2:  [88 %-100 %] 96 % (11/09 0734) Weight:  [133.981 kg (295 lb 6 oz)] 133.981 kg (295 lb 6 oz) (11/08 0827) Weight change:  Last BM Date: 01/19/14  Intake/Output from previous day: 11/08 0701 - 11/09 0700 In: 843 [P.O.:840; I.V.:3] Out: 650 [Urine:650]  PHYSICAL EXAM General appearance: alert and no distress Resp: diminished breath sounds bilaterally, rhonchi bilaterally and wheezes bilaterally Cardio: S1, S2 normal GI: soft, non-tender; bowel sounds normal; no masses,  no organomegaly Extremities: extremities normal, atraumatic, no cyanosis or edema  Lab Results:  Results for orders placed or performed during the hospital encounter of 01/19/14 (from the past 48 hour(s))  CBC with Differential     Status: Abnormal   Collection Time: 01/19/14  5:19 AM  Result Value Ref Range   WBC 11.5 (H) 4.0 - 10.5 K/uL   RBC 4.73 3.87 - 5.11 MIL/uL   Hemoglobin 14.4 12.0 - 15.0 g/dL   HCT 43.9 36.0 - 46.0 %   MCV 92.8 78.0 - 100.0 fL   MCH 30.4 26.0 - 34.0 pg   MCHC 32.8 30.0 - 36.0 g/dL   RDW 14.0 11.5 - 15.5 %   Platelets 231 150 - 400 K/uL   Neutrophils Relative % 75 43 - 77 %   Neutro Abs 8.5 (H) 1.7 - 7.7 K/uL   Lymphocytes Relative 15 12 - 46 %   Lymphs Abs 1.8 0.7 - 4.0 K/uL   Monocytes Relative 7 3 - 12 %   Monocytes Absolute 0.9 0.1 - 1.0 K/uL   Eosinophils Relative 3 0 - 5 %   Eosinophils Absolute 0.3 0.0 - 0.7 K/uL   Basophils Relative 0 0 - 1 %   Basophils Absolute 0.0 0.0 - 0.1 K/uL  Pro b natriuretic peptide (BNP)     Status: Abnormal   Collection Time: 01/19/14  5:19 AM   Result Value Ref Range   Pro B Natriuretic peptide (BNP) 196.1 (H) 0 - 125 pg/mL  Basic metabolic panel     Status: Abnormal   Collection Time: 01/19/14  5:19 AM  Result Value Ref Range   Sodium 140 137 - 147 mEq/L   Potassium 3.8 3.7 - 5.3 mEq/L   Chloride 100 96 - 112 mEq/L   CO2 29 19 - 32 mEq/L   Glucose, Bld 100 (H) 70 - 99 mg/dL   BUN 8 6 - 23 mg/dL   Creatinine, Ser 0.86 0.50 - 1.10 mg/dL   Calcium 9.0 8.4 - 10.5 mg/dL   GFR calc non Af Amer 76 (L) >90 mL/min   GFR calc Af Amer 88 (L) >90 mL/min    Comment: (NOTE) The eGFR has been calculated using the CKD EPI equation. This calculation has not been validated in all clinical situations. eGFR's persistently <90 mL/min signify possible Chronic Kidney Disease.    Anion gap 11 5 - 15  I-stat troponin, ED     Status: None   Collection Time: 01/19/14  5:27 AM  Result Value Ref Range   Troponin i, poc 0.00 0.00 - 0.08 ng/mL   Comment 3              Comment: Due to the release kinetics of cTnI, a negative result within the first hours of the onset of symptoms does not rule out myocardial infarction with certainty. If myocardial infarction is still suspected, repeat the test at appropriate intervals.   CBC     Status: Abnormal   Collection Time: 01/20/14  5:52 AM  Result Value Ref Range   WBC 16.4 (H) 4.0 - 10.5 K/uL   RBC 4.71 3.87 - 5.11 MIL/uL   Hemoglobin 14.2 12.0 - 15.0 g/dL   HCT 43.6 36.0 - 46.0 %   MCV 92.6 78.0 - 100.0 fL   MCH 30.1 26.0 - 34.0 pg   MCHC 32.6 30.0 - 36.0 g/dL   RDW 13.9 11.5 - 15.5 %   Platelets 283 150 - 400 K/uL    ABGS No results for input(s): PHART, PO2ART, TCO2, HCO3 in the last 72 hours.  Invalid input(s): PCO2 CULTURES No results found for this or any previous visit (from the past 240 hour(s)). Studies/Results: Dg Chest 2 View  01/19/2014   CLINICAL DATA:  Productive cough for 3 days.  Shortness of breath.  EXAM: CHEST  2 VIEW  COMPARISON:  06/21/2013  FINDINGS: Cardiac  pacemaker. The heart size and mediastinal contours are within normal limits. Both lungs are clear. The visualized skeletal structures are unremarkable.  IMPRESSION: No active cardiopulmonary disease.   Electronically Signed   By: Lucienne Capers M.D.   On: 01/19/2014 05:10    Medications: I have reviewed the patient's current medications.  Assesment: Principal Problem:   COPD exacerbation Active Problems:   Hypertension   Hypothyroidism   Tobacco abuse   Chronic systolic heart failure   COPD with acute exacerbation    Plan: Medications reviewed Continue nebulizer treatment, IV steroid and oral antibiotics Continue regular treatment     LOS: 1 day   Dominica Kent 01/20/2014, 7:42 AM

## 2014-01-20 NOTE — Progress Notes (Signed)
UR completed 

## 2014-01-21 NOTE — Progress Notes (Signed)
Subjective: Patient feels better. Her cough, congestion and wheezing is better today. No fever, chills, or chest pain. No nausea or vomiting.   Objective: Vital signs in last 24 hours: Temp:  [98 F (36.7 C)-98.6 F (37 C)] 98 F (36.7 C) (11/10 0452) Pulse Rate:  [86-87] 86 (11/10 0452) Resp:  [19-21] 21 (11/10 0452) BP: (118-143)/(58-94) 143/94 mmHg (11/10 0452) SpO2:  [91 %-99 %] 97 % (11/10 0709) Weight change:  Last BM Date: 01/20/14  Intake/Output from previous day: 11/09 0701 - 11/10 0700 In: 360 [P.O.:360] Out: -   PHYSICAL EXAM General appearance: alert and no distress Resp: diminished breath sounds bilaterally, rhonchi bilaterally and wheezes bilaterally Cardio: S1, S2 normal GI: soft, non-tender; bowel sounds normal; no masses,  no organomegaly Extremities: extremities normal, atraumatic, no cyanosis or edema  Lab Results:  Results for orders placed or performed during the hospital encounter of 01/19/14 (from the past 48 hour(s))  Basic metabolic panel     Status: Abnormal   Collection Time: 01/20/14  5:52 AM  Result Value Ref Range   Sodium 140 137 - 147 mEq/L   Potassium 4.5 3.7 - 5.3 mEq/L   Chloride 103 96 - 112 mEq/L   CO2 26 19 - 32 mEq/L   Glucose, Bld 143 (H) 70 - 99 mg/dL   BUN 9 6 - 23 mg/dL   Creatinine, Ser 0.68 0.50 - 1.10 mg/dL   Calcium 9.5 8.4 - 10.5 mg/dL   GFR calc non Af Amer >90 >90 mL/min   GFR calc Af Amer >90 >90 mL/min    Comment: (NOTE) The eGFR has been calculated using the CKD EPI equation. This calculation has not been validated in all clinical situations. eGFR's persistently <90 mL/min signify possible Chronic Kidney Disease.    Anion gap 11 5 - 15  CBC     Status: Abnormal   Collection Time: 01/20/14  5:52 AM  Result Value Ref Range   WBC 16.4 (H) 4.0 - 10.5 K/uL   RBC 4.71 3.87 - 5.11 MIL/uL   Hemoglobin 14.2 12.0 - 15.0 g/dL   HCT 43.6 36.0 - 46.0 %   MCV 92.6 78.0 - 100.0 fL   MCH 30.1 26.0 - 34.0 pg   MCHC 32.6  30.0 - 36.0 g/dL   RDW 13.9 11.5 - 15.5 %   Platelets 283 150 - 400 K/uL    ABGS No results for input(s): PHART, PO2ART, TCO2, HCO3 in the last 72 hours.  Invalid input(s): PCO2 CULTURES No results found for this or any previous visit (from the past 240 hour(s)). Studies/Results: No results found.  Medications: I have reviewed the patient's current medications.  Assesment: Principal Problem:   COPD exacerbation Active Problems:   Hypertension   Hypothyroidism   Tobacco abuse   Chronic systolic heart failure   COPD with acute exacerbation    Plan: Medications reviewed Continue nebulizer treatment Will taper  Continue regular treatment     LOS: 2 days   FANTA,TESFAYE 01/21/2014, 7:58 AM   

## 2014-01-21 NOTE — Plan of Care (Signed)
Problem: ICU Phase Progression Outcomes Goal: Dyspnea controlled at rest Outcome: Completed/Met Date Met:  01/21/14     

## 2014-01-22 MED ORDER — PREDNISONE (PAK) 10 MG PO TABS
ORAL_TABLET | Freq: Every day | ORAL | Status: DC
Start: 2014-01-22 — End: 2014-07-02

## 2014-01-22 MED ORDER — LEVOFLOXACIN 500 MG PO TABS: 500.0000 mg | ORAL_TABLET | Freq: Every day | ORAL | Status: DC

## 2014-01-22 NOTE — Progress Notes (Signed)
Patient ride arrive. Taken to lobby via wheelchair by Nurse [employer] No distress noted

## 2014-01-22 NOTE — Plan of Care (Signed)
Problem: Phase I Progression Outcomes Goal: Dyspnea controlled at rest Outcome: Completed/Met Date Met:  01/22/14

## 2014-01-22 NOTE — Plan of Care (Signed)
Problem: Phase I Progression Outcomes Goal: Discharge plan established Outcome: Completed/Met Date Met:  01/22/14

## 2014-01-22 NOTE — Progress Notes (Signed)
Discharge instruction reviewed with patient. No distress noted. IV removed. Patient waiting for a friend to come to provide transportation home.

## 2014-01-22 NOTE — Plan of Care (Signed)
Problem: Phase I Progression Outcomes Goal: Pain controlled Outcome: Completed/Met Date Met:  01/22/14

## 2014-01-22 NOTE — Discharge Summary (Signed)
Physician Discharge Summary  Patient ID: Melanie Cordova MRN: [id number] DOB/AGE: 06/11/1960 53 y.o. Primary Care Physician:Willo Yoon, MD Admit date: 01/19/2014 Discharge date: 01/22/2014    Discharge Diagnoses:   Principal Problem:   COPD exacerbation Active Problems:   Hypertension   Hypothyroidism   Tobacco abuse   Chronic systolic heart failure   COPD with acute exacerbation     Medication List    TAKE these medications        albuterol 108 (90 BASE) MCG/ACT inhaler  Commonly known as:  PROVENTIL HFA;VENTOLIN HFA  Inhale 2 puffs into the lungs every 6 (six) hours as needed for wheezing or shortness of breath.     aspirin EC 81 MG tablet  Take 81 mg by mouth daily.     budesonide-formoterol 80-4.5 MCG/ACT inhaler  Commonly known as:  SYMBICORT  Inhale 2 puffs into the lungs 2 (two) times daily.     carvedilol 12.5 MG tablet  Commonly known as:  COREG  Take 25 mg by mouth 2 (two) times daily with a meal.     desloratadine 5 MG tablet  Commonly known as:  CLARINEX  Take 5 mg by mouth daily.     diclofenac sodium 1 % Gel  Commonly known as:  VOLTAREN  Apply 2 g topically 4 (four) times daily as needed (pain).     diphenhydrAMINE 25 mg capsule  Commonly known as:  BENADRYL  Take 25 mg by mouth every 8 (eight) hours as needed for allergies.     furosemide 40 MG tablet  Commonly known as:  LASIX  Take 1 tablet (40 mg total) by mouth 2 (two) times daily.     gabapentin 600 MG tablet  Commonly known as:  NEURONTIN  Take 600 mg by mouth 3 (three) times daily. Patient takes 1 tablet in the morning and in the afternoon, then takes 2 tablets at bedtime     Iron 28 MG Tabs  Take 1 tablet by mouth daily.     levofloxacin 500 MG tablet  Commonly known as:  LEVAQUIN  Take 1 tablet (500 mg total) by mouth daily.     levothyroxine 200 MCG tablet  Commonly known as:  SYNTHROID, LEVOTHROID  Take 200 mcg by mouth daily before breakfast. Takes with 13mcg to  equal 278mcg     levothyroxine 25 MCG tablet  Commonly known as:  SYNTHROID, LEVOTHROID  Take 25 mcg by mouth daily before breakfast. Takes with 256mcg to equal 218mcg     lisinopril 10 MG tablet  Commonly known as:  PRINIVIL,ZESTRIL  Take 1 tablet (10 mg total) by mouth daily.     lovastatin 40 MG tablet  Commonly known as:  MEVACOR  Take 1 tablet (40 mg total) by mouth at bedtime.     oxymetazoline 0.05 % nasal spray  Commonly known as:  AFRIN  Place 2 sprays into the nose daily as needed for congestion.     potassium chloride SA 20 MEQ tablet  Commonly known as:  K-DUR,KLOR-CON  Take 2 tablets (40 mEq total) by mouth 2 (two) times daily.     predniSONE 10 MG tablet  Commonly known as:  STERAPRED UNI-PAK  Take by mouth daily. 4 tab po daily for 3 days, then 3 tab po daily for 3 days, 2 tab po daily for 3 days, 1 tab po daily for 3 days.     ranitidine 150 MG tablet  Commonly known as:  ZANTAC  Take 150 mg by mouth  at bedtime.     tiotropium 18 MCG inhalation capsule  Commonly known as:  SPIRIVA  Place 18 mcg into inhaler and inhale daily.        Discharged Condition: improved    Consults: none  Significant Diagnostic Studies: Dg Chest 2 View  01/19/2014   CLINICAL DATA:  Productive cough for 3 days.  Shortness of breath.  EXAM: CHEST  2 VIEW  COMPARISON:  06/21/2013  FINDINGS: Cardiac pacemaker. The heart size and mediastinal contours are within normal limits. Both lungs are clear. The visualized skeletal structures are unremarkable.  IMPRESSION: No active cardiopulmonary disease.   Electronically Signed   By: Lucienne Capers M.D.   On: 01/19/2014 05:10    Lab Results: Basic Metabolic Panel:  Recent Labs  01/20/14 0552  NA 140  K 4.5  CL 103  CO2 26  GLUCOSE 143*  BUN 9  CREATININE 0.68  CALCIUM 9.5   Liver Function Tests: No results for input(s): AST, ALT, ALKPHOS, BILITOT, PROT, ALBUMIN in the last 72 hours.   CBC:  Recent Labs   01/20/14 0552  WBC 16.4*  HGB 14.2  HCT 43.6  MCV 92.6  PLT 283    No results found for this or any previous visit (from the past 240 hour(s)).   Hospital Course:   This is a 53 years old female with history of multiple medical illnesses was admitted due to acute exacerbation of COPD. Patient was started on nebulizer treated, oran antibiotics and IV steroid. Patient improved over the hospital stay and was discharged in a stable condition.  Discharge Exam: Blood pressure 145/92, pulse 60, temperature 98 F (36.7 C), temperature source Oral, resp. rate 20, height 5' 7.5" (1.715 m), weight 133.981 kg (295 lb 6 oz), SpO2 97 %.   Disposition:  home        Follow-up Information    Follow up with [facility], MD In 2 weeks.   Specialty:  Internal Medicine   Contact information:   [place] [id number] [phone]       Signed: Rosita Fire   01/22/2014, 7:53 AM

## 2014-01-31 ENCOUNTER — Ambulatory Visit: Payer: Medicaid Other | Attending: Neurology | Admitting: Sleep Medicine

## 2014-01-31 DIAGNOSIS — Z79899 Other long term (current) drug therapy: Secondary | ICD-10-CM | POA: Diagnosis not present

## 2014-01-31 DIAGNOSIS — Z7951 Long term (current) use of inhaled steroids: Secondary | ICD-10-CM | POA: Insufficient documentation

## 2014-01-31 DIAGNOSIS — R0683 Snoring: Secondary | ICD-10-CM | POA: Insufficient documentation

## 2014-01-31 DIAGNOSIS — G4733 Obstructive sleep apnea (adult) (pediatric): Secondary | ICD-10-CM | POA: Insufficient documentation

## 2014-01-31 DIAGNOSIS — G4710 Hypersomnia, unspecified: Secondary | ICD-10-CM | POA: Insufficient documentation

## 2014-01-31 DIAGNOSIS — Z7982 Long term (current) use of aspirin: Secondary | ICD-10-CM | POA: Diagnosis not present

## 2014-01-31 DIAGNOSIS — R5383 Other fatigue: Secondary | ICD-10-CM | POA: Insufficient documentation

## 2014-01-31 DIAGNOSIS — G4730 Sleep apnea, unspecified: Secondary | ICD-10-CM

## 2014-02-01 NOTE — Sleep Study (Signed)
[place] A. Merlene Laughter, MD     [email or web address]        NOCTURNAL POLYSOMNOGRAM    LOCATION: SLEEP LAB FACILITY: [facility]   PHYSICIAN: Randle Shatzer A. Merlene Laughter, M.D.   DATE OF STUDY: 01/31/2014.   REFERRING PHYSICIAN: Ghazal Pevey.   INDICATIONS: The patient is a 53 year old presents with hypersomnia, fatigue and snoring.  MEDICATIONS:  Prior to Admission medications   Medication Sig Start Date End Date Taking? Authorizing Provider  albuterol (PROVENTIL HFA;VENTOLIN HFA) 108 (90 BASE) MCG/ACT inhaler Inhale 2 puffs into the lungs every 6 (six) hours as needed for wheezing or shortness of breath. 07/12/12   Tonia Brooms, MD  aspirin EC 81 MG tablet Take 81 mg by mouth daily.    Historical Provider, MD  budesonide-formoterol (SYMBICORT) 80-4.5 MCG/ACT inhaler Inhale 2 puffs into the lungs 2 (two) times daily.    Historical Provider, MD  carvedilol (COREG) 12.5 MG tablet Take 25 mg by mouth 2 (two) times daily with a meal.    Historical Provider, MD  desloratadine (CLARINEX) 5 MG tablet Take 5 mg by mouth daily.    Historical Provider, MD  diclofenac sodium (VOLTAREN) 1 % GEL Apply 2 g topically 4 (four) times daily as needed (pain).     Historical Provider, MD  diphenhydrAMINE (BENADRYL) 25 mg capsule Take 25 mg by mouth every 8 (eight) hours as needed for allergies.     Historical Provider, MD  Ferrous Sulfate (IRON) 28 MG TABS Take 1 tablet by mouth daily.    Historical Provider, MD  furosemide (LASIX) 40 MG tablet Take 1 tablet (40 mg total) by mouth 2 (two) times daily. 08/29/12   Yehuda Savannah, MD  gabapentin (NEURONTIN) 600 MG tablet Take 600 mg by mouth 3 (three) times daily. Patient takes 1 tablet in the morning and in the afternoon, then takes 2 tablets at bedtime    Historical Provider, MD  levofloxacin (LEVAQUIN) 500 MG tablet Take 1 tablet (500 mg total) by mouth daily. 01/22/14   Rosita Fire, MD  levothyroxine (SYNTHROID, LEVOTHROID) 200 MCG tablet  Take 200 mcg by mouth daily before breakfast. Takes with 34mcg to equal 259mcg    Historical Provider, MD  levothyroxine (SYNTHROID, LEVOTHROID) 25 MCG tablet Take 25 mcg by mouth daily before breakfast. Takes with 248mcg to equal 276mcg    Historical Provider, MD  lisinopril (PRINIVIL,ZESTRIL) 10 MG tablet Take 1 tablet (10 mg total) by mouth daily. 08/29/12   Yehuda Savannah, MD  lovastatin (MEVACOR) 40 MG tablet Take 1 tablet (40 mg total) by mouth at bedtime. 08/29/12   Yehuda Savannah, MD  oxymetazoline (AFRIN) 0.05 % nasal spray Place 2 sprays into the nose daily as needed for congestion.    Historical Provider, MD  potassium chloride SA (K-DUR,KLOR-CON) 20 MEQ tablet Take 2 tablets (40 mEq total) by mouth 2 (two) times daily. 08/29/12   Yehuda Savannah, MD  predniSONE (STERAPRED UNI-PAK) 10 MG tablet Take by mouth daily. 4 tab po daily for 3 days, then 3 tab po daily for 3 days, 2 tab po daily for 3 days, 1 tab po daily for 3 days. 01/22/14   Rosita Fire, MD  ranitidine (ZANTAC) 150 MG tablet Take 150 mg by mouth at bedtime.     Historical Provider, MD  tiotropium (SPIRIVA) 18 MCG inhalation capsule Place 18 mcg into inhaler and inhale daily.    Historical Provider, MD      EPWORTH SLEEPINESS SCALE: 7.   BMI:  46.   ARCHITECTURAL SUMMARY: Total recording time was 465 minutes. Sleep efficiency 85 %. Sleep latency 11 minutes. REM latency 98 minutes. Stage NI 3 %, N2 54 % and N3 26 % and REM sleep 16 %.    RESPIRATORY DATA:  Baseline oxygen saturation is 93 %. The lowest saturation is 82 %. The diagnostic AHI is 7. The RDI is 8. The REM AHI is 28.  LIMB MOVEMENT SUMMARY: PLM index 0.   ELECTROCARDIOGRAM SUMMARY: Average heart rate is 76 with no significant dysrhythmias observed.   IMPRESSION:  1. 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 Positive pressure treatment is not required.  Thanks for this referral.  Bexton Haak A. Merlene Laughter, M.D. Diplomat, Tax adviser of  Sleep Medicine.

## 2014-02-19 ENCOUNTER — Ambulatory Visit (INDEPENDENT_AMBULATORY_CARE_PROVIDER_SITE_OTHER): Payer: Medicaid Other | Admitting: *Deleted

## 2014-02-19 ENCOUNTER — Encounter: Payer: Self-pay | Admitting: Internal Medicine

## 2014-02-19 DIAGNOSIS — I429 Cardiomyopathy, unspecified: Secondary | ICD-10-CM

## 2014-02-19 LAB — MDC_IDC_ENUM_SESS_TYPE_REMOTE
Battery Remaining Percentage: 77 %
Battery Voltage: 2.96 V
Date Time Interrogation Session: 20151209080911
HighPow Impedance: 96 Ohm
HighPow Impedance: 96 Ohm
Implantable Pulse Generator Serial Number: 1022442
Lead Channel Setting Pacing Amplitude: 2.5 V
Lead Channel Setting Pacing Pulse Width: 0.5 ms
Lead Channel Setting Sensing Sensitivity: 0.5 mV
MDC IDC MSMT BATTERY REMAINING LONGEVITY: 80 mo
MDC IDC MSMT LEADCHNL RV IMPEDANCE VALUE: 590 Ohm
MDC IDC MSMT LEADCHNL RV SENSING INTR AMPL: 12 mV
MDC IDC SET ZONE DETECTION INTERVAL: 300 ms
MDC IDC STAT BRADY RV PERCENT PACED: 1 %

## 2014-02-19 NOTE — Progress Notes (Signed)
Remote ICD transmission.   

## 2014-02-20 ENCOUNTER — Ambulatory Visit (INDEPENDENT_AMBULATORY_CARE_PROVIDER_SITE_OTHER): Payer: Medicaid Other | Admitting: Otolaryngology

## 2014-02-20 ENCOUNTER — Encounter (HOSPITAL_COMMUNITY): Payer: Self-pay | Admitting: Internal Medicine

## 2014-02-20 DIAGNOSIS — J342 Deviated nasal septum: Secondary | ICD-10-CM

## 2014-02-20 DIAGNOSIS — J343 Hypertrophy of nasal turbinates: Secondary | ICD-10-CM

## 2014-02-28 ENCOUNTER — Encounter: Payer: Self-pay | Admitting: Cardiology

## 2014-03-12 ENCOUNTER — Encounter: Payer: Self-pay | Admitting: Nutrition

## 2014-03-12 ENCOUNTER — Encounter: Payer: Medicaid Other | Attending: "Endocrinology | Admitting: Nutrition

## 2014-03-12 VITALS — Ht 67.0 in | Wt 299.6 lb

## 2014-03-12 DIAGNOSIS — I5022 Chronic systolic (congestive) heart failure: Secondary | ICD-10-CM

## 2014-03-12 DIAGNOSIS — Z6841 Body Mass Index (BMI) 40.0 and over, adult: Secondary | ICD-10-CM | POA: Insufficient documentation

## 2014-03-12 DIAGNOSIS — Z713 Dietary counseling and surveillance: Secondary | ICD-10-CM | POA: Insufficient documentation

## 2014-03-12 DIAGNOSIS — E785 Hyperlipidemia, unspecified: Secondary | ICD-10-CM

## 2014-03-12 NOTE — Patient Instructions (Signed)
Goal  1 Cut out sweet tea 2.  Follow the Plate Method 3. Measure foods out.. 4. Walk 15 minutes twice a day three times per week. 5. Lose 1 lb per week. 6. Keep a food journal. And bring at next visit.

## 2014-03-12 NOTE — Progress Notes (Signed)
  Medical Nutrition Therapy:  Appt start time: 1330 end time:  1430.   Assessment:  Primary concerns today: Obesity. Lives by herself and prepares her own meals. Most foods are baked and sometimes fried.  Eats mostly at home. Physical activity; walking for 30 minutes a few times a week. As breathing and asthma issues and on steroids.  Personal goal is to get her weight down to 200 lbs. Had heart surgery and they put in a defibilator in 2013. History of CHF. Strong family history of DM. History of thyroid problems  Preferred Learning Style:     No preference indicated   Learning Readiness:    Ready  Change in progress   MEDICATIONS: see list   DIETARY INTAKE:  24-hr recall:  B ( AM): Bran flakes OR 2 boiled eggs and toast or piece of sausage or bacon, coffee occasionally grape juice Snk ( AM): none  L ( PM): Toss salad OR 1 cup fruit with sandwich. Sweet tea Snk ( PM):  D ( PM): Baked chicken,  Broccoli, pasta salad, Sweet tea Snk ( PM): Tortilla chips with salsa or club crackers with cheese OR PB Jelly sandwich. Beverages: water, sweet team  Usual physical activity: walking some during the week.  Interbentins: Goal  1 Cut out sweet tea 2.  Follow the Plate Method 3. Measure foods out.. 4. Walk 15 minutes twice a day three times per week. 5. Lose 1 lb per week. 6. Keep a food journal. And bring at next visit.  Estimated energy needs: 1600 calories 180 g carbohydrates 120 g protein 44 g fat  Progress Towards Goal(s):  In progress.   Nutritional Diagnosis:  NB-1.1 Food and nutrition-related knowledge deficit As related to Obesity.  As evidenced by BMI > 40.    Intervention:  Nutrition counseling on healthy weight loss tips, ways to prevent diabetes and importance of exercise. Plan Aim for 2-3 Carb Choices per meal (3-45 grams) +/- 1 either way  Cut out snacks between meals. Consider including protein with mealsonsider reading food labels for Total Carbohydrate  and Fat Grams of foods Consider  increasing your activity level by 30 minutes daily as tolerated Goal  1 Cut out sweet tea 2.  Follow the Plate Method 3. Measure foods out.. 4. Walk 15 minutes twice a day three times per week. 5. Lose 1 lb per week. 6. Keep a food journal. And bring at next visit.  Teaching Method Utilized:  Visual Auditory Hands on  Handouts given during visit include: The Plate Method Healthy Weight loss tips Meal Plan Card  Barriers to learning/adherence to lifestyle change: none  Demonstrated degree of understanding via:  Teach Back   Monitoring/Evaluation:  Dietary intake, exercise, meal planning, and body weight in 1 month(s).  Need to refer her to the [facility] diabetes prevention program.

## 2014-03-18 ENCOUNTER — Encounter: Payer: Self-pay | Admitting: Cardiology

## 2014-03-20 ENCOUNTER — Ambulatory Visit (INDEPENDENT_AMBULATORY_CARE_PROVIDER_SITE_OTHER): Payer: Medicaid Other | Admitting: Otolaryngology

## 2014-03-27 ENCOUNTER — Ambulatory Visit (INDEPENDENT_AMBULATORY_CARE_PROVIDER_SITE_OTHER): Payer: Medicare Other | Admitting: Otolaryngology

## 2014-03-27 DIAGNOSIS — J342 Deviated nasal septum: Secondary | ICD-10-CM | POA: Diagnosis not present

## 2014-03-27 DIAGNOSIS — J343 Hypertrophy of nasal turbinates: Secondary | ICD-10-CM | POA: Diagnosis not present

## 2014-03-27 DIAGNOSIS — J310 Chronic rhinitis: Secondary | ICD-10-CM | POA: Diagnosis not present

## 2014-04-16 ENCOUNTER — Encounter: Payer: Medicaid Other | Attending: "Endocrinology | Admitting: Nutrition

## 2014-04-16 NOTE — Patient Instructions (Signed)
Plan Goal  Keep up the good work with cutting out processed foods and eating more fresh vegetables. Eat 1 piece of fresh fruit with each meal.  Cut out toast OR Cereal at breakfast.  Choose egg at breakfast and cut out other meat/protein at breakfast.   1 Cut out sweet tea 2.  Follow the Plate Method 3. Measure out rice, beans to 1/2c servings. 5. Lose 1 lb per week. 6. Keep a food journal and bring at next visit. Cut extra protein and cereal or toast for breakfast Increase fruit to 3 servings per meal--fresh is best. 4. Walk 30 minutes three times per week 5. Lose 1 lb per week. 6. Keep a food journal. And bring at next visit.    Consider to look into smoking cessation classes.

## 2014-04-16 NOTE — Progress Notes (Signed)
  Medical Nutrition Therapy:  Appt start time:  end time:  1430.   Assessment:  Primary concerns today: Obesity. Has walked every day for the last 4 days for 30 minutes. Still smoking 1/2 pack per day. Willing to cut down on it but not ready to quit smoking yet. Keep food jounal, has been eating better and following the Plate Method better. However she has been eating too much at breakfast due to a misunderstanding.  Has cut down on the amount of sugar in her tea. Changed to Splenda or Equal from regular sugar. Hasn't been snacking. Gained 3 pounds from last visit.  Preferred Learning Style:     No preference indicated   Learning Readiness:    Ready  Change in progress   MEDICATIONS: see list   DIETARY INTAKE:  24-hr recall:  B ( AM): 1 egg, 2 slices toast, bowl of bran flakes with 2% milk and a piece of fruit, water Snk ( AM): none  L ( PM): Beef, stirfry vegetabes, rice, baked beans, water Snk ( PM):  D ( PM): Baked chicken, toss salad, rice, corn, bverages: water, and unsweet tea  Usual physical activity: walking some during the week but not much since the snow.  Intervention: Reviewed meal planning, MY PLate and importance of portion control for CHO choices. Discussed healthy weight loss tips and the need for increased exercise to 30 minutes 4 days a week for needed weight loss. Reviewed changes to make in her current food journal to provide desired weight loss results.    Estimated energy needs: 1600 calories 180 g carbohydrates 120 g protein 44 g fat  Progress Towards Goal(s):  In progress.   Nutritional Diagnosis:  NB-1.1 Food and nutrition-related knowledge deficit As related to Obesity.  As evidenced by BMI > 40.    Intervention:  Nutrition counseling on healthy weight loss tips, ways to prevent diabetes and importance of exercise. Plan Goal  Keep up the good work with cutting out processed foods and eating more fresh vegetables. Eat 1 piece of fresh  fruit with each meal.  Cut out toast OR Cereal at breakfast.  Choose egg at breakfast and cut out other meat/protein at breakfast.   1 Cut out sweet tea 2.  Follow the Plate Method 3. Measure out rice, beans to 1/2c servings. 5. Lose 1 lb per week. 6. Keep a food journal and bring at next visit. Cut extra protein and cereal or toast for breakfast Increase fruit to 3 servings per meal--fresh is best. 4. Walk 30 minutes three times per week 5. Lose 1 lb per week. 6. Keep a food journal. And bring at next visit.    Consider to look into smoking cessation classes.   Teaching Method Utilized:  Visual Auditory Hands on  Handouts given during visit include: The Plate Method Healthy Weight loss tips Meal Plan Card  Barriers to learning/adherence to lifestyle change: none  Demonstrated degree of understanding via:  Teach Back   Monitoring/Evaluation:  Dietary intake, exercise, meal planning, and body weight in 1 month(s).  Can't do the [facility] program due to transporation problems.

## 2014-05-02 DIAGNOSIS — E6609 Other obesity due to excess calories: Secondary | ICD-10-CM | POA: Diagnosis not present

## 2014-05-02 DIAGNOSIS — E038 Other specified hypothyroidism: Secondary | ICD-10-CM | POA: Diagnosis not present

## 2014-05-02 DIAGNOSIS — E1165 Type 2 diabetes mellitus with hyperglycemia: Secondary | ICD-10-CM | POA: Diagnosis not present

## 2014-05-02 DIAGNOSIS — E559 Vitamin D deficiency, unspecified: Secondary | ICD-10-CM | POA: Diagnosis not present

## 2014-05-09 DIAGNOSIS — E559 Vitamin D deficiency, unspecified: Secondary | ICD-10-CM | POA: Diagnosis not present

## 2014-05-09 DIAGNOSIS — E785 Hyperlipidemia, unspecified: Secondary | ICD-10-CM | POA: Diagnosis not present

## 2014-05-09 DIAGNOSIS — E038 Other specified hypothyroidism: Secondary | ICD-10-CM | POA: Diagnosis not present

## 2014-05-15 ENCOUNTER — Encounter: Payer: Medicare Other | Attending: "Endocrinology | Admitting: Nutrition

## 2014-05-15 DIAGNOSIS — Z6841 Body Mass Index (BMI) 40.0 and over, adult: Secondary | ICD-10-CM | POA: Insufficient documentation

## 2014-05-15 DIAGNOSIS — E785 Hyperlipidemia, unspecified: Secondary | ICD-10-CM | POA: Diagnosis not present

## 2014-05-15 DIAGNOSIS — E038 Other specified hypothyroidism: Secondary | ICD-10-CM | POA: Diagnosis not present

## 2014-05-15 DIAGNOSIS — Z713 Dietary counseling and surveillance: Secondary | ICD-10-CM | POA: Diagnosis not present

## 2014-05-15 NOTE — Patient Instructions (Signed)
  Goal  Keep up the good work with cutting out processed foods and eating more fresh vegetables.   Keep food journal   Lose 1- 2 lbs per week. Walk 30 minutes three a week  Keep a food journal. And bring at next visit.  Consider to look into smoking cessation classes

## 2014-05-15 NOTE — Progress Notes (Signed)
  Medical Nutrition Therapy:  Appt start time:0915  end time: 0930.  Assessment:  Primary concerns today: Obesity. Lost 7 lbs since last visit 1 month ago. Brought in food journal. Feels better. Is more aware of what she is eating and how much and what not to tea. Exercise: walking 30-45 minutes twice a week. Cut out sweet tea and processed foods. Better food choices and no processed foods much anymore. Still smoking 1/2 pack per day. Working on quitting smoking.  Keep food jounal and has been eating better and following the Plate Method better. Changed to Splenda or Equal from regular sugar.  Preferred Learning Style:     No preference indicated   Learning Readiness:    Ready  Change in progress   MEDICATIONS: see list   DIETARY INTAKE:  24-hr recall:  B ( AM): 1 egg, bran flakes, 1 c 1% milk Snk ( AM): none  L ( PM):chicken, turnip greens, potatoes, and peach and water Snk ( PM):  D ( PM): Baked chicken, toss salad, broccoli/squash.  corn, bverages: water, and unsweet tea  Usual physical activity: walking 30 minutes twice a week .   Estimated energy needs: 1600 calories 180 g carbohydrates 120 g protein 44 g fat  Progress Towards Goal(s):  In progress.   Nutritional Diagnosis:  NB-1.1 Food and nutrition-related knowledge deficit As related to Obesity.  As evidenced by BMI > 40.    Intervention:  Nutrition counseling on healthy weight loss tips, ways to prevent diabetes and importance of exercise. Plan Goal  Keep up the good work with cutting out processed foods and eating more fresh vegetables.  Keep food journal   Lose 5 lbs by next visit. Increase walking 30 minutes three a week  Keep a food journal. And bring at next visit. Continue to work on quitting smoking.  Teaching Method Utilized:  Visual Auditory Hands on  Handouts given during visit include: The Plate Method Healthy Weight loss tips Meal Plan Card  Barriers to learning/adherence to  lifestyle change: none  Demonstrated degree of understanding via:  Teach Back   Monitoring/Evaluation:  Dietary intake, exercise, meal planning, and body weight in 1 month(s).

## 2014-05-21 ENCOUNTER — Emergency Department (HOSPITAL_COMMUNITY): Payer: Medicare Other

## 2014-05-21 ENCOUNTER — Emergency Department (HOSPITAL_COMMUNITY)
Admission: EM | Admit: 2014-05-21 | Discharge: 2014-05-21 | Disposition: A | Discharge status: Home / Self Care | Payer: Medicare Other | Attending: Emergency Medicine | Admitting: Emergency Medicine

## 2014-05-21 ENCOUNTER — Encounter (HOSPITAL_COMMUNITY): Payer: Self-pay | Admitting: Emergency Medicine

## 2014-05-21 DIAGNOSIS — Z720 Tobacco use: Secondary | ICD-10-CM | POA: Diagnosis not present

## 2014-05-21 DIAGNOSIS — Z7952 Long term (current) use of systemic steroids: Secondary | ICD-10-CM | POA: Diagnosis not present

## 2014-05-21 DIAGNOSIS — I5022 Chronic systolic (congestive) heart failure: Secondary | ICD-10-CM | POA: Insufficient documentation

## 2014-05-21 DIAGNOSIS — Z8673 Personal history of transient ischemic attack (TIA), and cerebral infarction without residual deficits: Secondary | ICD-10-CM | POA: Diagnosis not present

## 2014-05-21 DIAGNOSIS — G709 Myoneural disorder, unspecified: Secondary | ICD-10-CM | POA: Diagnosis not present

## 2014-05-21 DIAGNOSIS — R51 Headache: Secondary | ICD-10-CM | POA: Insufficient documentation

## 2014-05-21 DIAGNOSIS — Z79899 Other long term (current) drug therapy: Secondary | ICD-10-CM | POA: Diagnosis not present

## 2014-05-21 DIAGNOSIS — J449 Chronic obstructive pulmonary disease, unspecified: Secondary | ICD-10-CM | POA: Diagnosis not present

## 2014-05-21 DIAGNOSIS — Z95810 Presence of automatic (implantable) cardiac defibrillator: Secondary | ICD-10-CM | POA: Insufficient documentation

## 2014-05-21 DIAGNOSIS — J45909 Unspecified asthma, uncomplicated: Secondary | ICD-10-CM | POA: Diagnosis not present

## 2014-05-21 DIAGNOSIS — L218 Other seborrheic dermatitis: Secondary | ICD-10-CM | POA: Diagnosis not present

## 2014-05-21 DIAGNOSIS — G40909 Epilepsy, unspecified, not intractable, without status epilepticus: Secondary | ICD-10-CM | POA: Diagnosis not present

## 2014-05-21 DIAGNOSIS — F419 Anxiety disorder, unspecified: Secondary | ICD-10-CM | POA: Diagnosis not present

## 2014-05-21 DIAGNOSIS — Z7951 Long term (current) use of inhaled steroids: Secondary | ICD-10-CM | POA: Diagnosis not present

## 2014-05-21 DIAGNOSIS — I499 Cardiac arrhythmia, unspecified: Secondary | ICD-10-CM | POA: Insufficient documentation

## 2014-05-21 DIAGNOSIS — E785 Hyperlipidemia, unspecified: Secondary | ICD-10-CM | POA: Diagnosis not present

## 2014-05-21 DIAGNOSIS — Z7982 Long term (current) use of aspirin: Secondary | ICD-10-CM | POA: Insufficient documentation

## 2014-05-21 DIAGNOSIS — Z880 Allergy status to penicillin: Secondary | ICD-10-CM | POA: Insufficient documentation

## 2014-05-21 DIAGNOSIS — E669 Obesity, unspecified: Secondary | ICD-10-CM | POA: Diagnosis not present

## 2014-05-21 DIAGNOSIS — K219 Gastro-esophageal reflux disease without esophagitis: Secondary | ICD-10-CM | POA: Diagnosis not present

## 2014-05-21 DIAGNOSIS — R519 Headache, unspecified: Secondary | ICD-10-CM

## 2014-05-21 DIAGNOSIS — E039 Hypothyroidism, unspecified: Secondary | ICD-10-CM | POA: Diagnosis not present

## 2014-05-21 DIAGNOSIS — I10 Essential (primary) hypertension: Secondary | ICD-10-CM | POA: Diagnosis not present

## 2014-05-21 DIAGNOSIS — Z8701 Personal history of pneumonia (recurrent): Secondary | ICD-10-CM | POA: Insufficient documentation

## 2014-05-21 DIAGNOSIS — L210 Seborrhea capitis: Secondary | ICD-10-CM

## 2014-05-21 DIAGNOSIS — R079 Chest pain, unspecified: Secondary | ICD-10-CM | POA: Diagnosis not present

## 2014-05-21 DIAGNOSIS — R42 Dizziness and giddiness: Secondary | ICD-10-CM | POA: Diagnosis not present

## 2014-05-21 DIAGNOSIS — M1990 Unspecified osteoarthritis, unspecified site: Secondary | ICD-10-CM | POA: Diagnosis not present

## 2014-05-21 DIAGNOSIS — R0602 Shortness of breath: Secondary | ICD-10-CM | POA: Diagnosis not present

## 2014-05-21 LAB — TROPONIN I
Troponin I: <0.03 ng/mL (ref <0.031)
Troponin I: <0.03 ng/mL (ref <0.031)

## 2014-05-21 LAB — CBC WITH DIFFERENTIAL/PLATELET
BASOS ABS: 0.1 10*3/uL (ref 0.0–0.1)
Basophils Relative: 1 % (ref 0–1)
EOS ABS: 0.3 10*3/uL (ref 0.0–0.7)
Eosinophils Relative: 3 % (ref 0–5)
HEMATOCRIT: 44.4 % (ref 36.0–46.0)
HEMOGLOBIN: 14.4 g/dL (ref 12.0–15.0)
Lymphocytes Relative: 34 % (ref 12–46)
Lymphs Abs: 3.6 10*3/uL (ref 0.7–4.0)
MCH: 30 pg (ref 26.0–34.0)
MCHC: 32.4 g/dL (ref 30.0–36.0)
MCV: 92.5 fL (ref 78.0–100.0)
MONO ABS: 0.7 10*3/uL (ref 0.1–1.0)
Monocytes Relative: 7 % (ref 3–12)
Neutro Abs: 5.8 10*3/uL (ref 1.7–7.7)
Neutrophils Relative %: 55 % (ref 43–77)
Platelets: 242 10*3/uL (ref 150–400)
RBC: 4.8 MIL/uL (ref 3.87–5.11)
RDW: 13.3 % (ref 11.5–15.5)
WBC: 10.5 10*3/uL (ref 4.0–10.5)

## 2014-05-21 LAB — BASIC METABOLIC PANEL
Anion gap: 8 (ref 5–15)
BUN: 14 mg/dL (ref 6–23)
CHLORIDE: 103 mmol/L (ref 96–112)
CO2: 27 mmol/L (ref 19–32)
CREATININE: 0.79 mg/dL (ref 0.50–1.10)
Calcium: 8.9 mg/dL (ref 8.4–10.5)
GFR calc non Af Amer: >90 mL/min (ref >90)
Glucose, Bld: 113 mg/dL — ABNORMAL HIGH (ref 70–99)
POTASSIUM: 3.7 mmol/L (ref 3.5–5.1)
SODIUM: 138 mmol/L (ref 135–145)

## 2014-05-21 LAB — HEPATIC FUNCTION PANEL
ALK PHOS: 86 U/L (ref 39–117)
ALT: 23 U/L (ref 0–35)
AST: 19 U/L (ref 0–37)
Albumin: 4 g/dL (ref 3.5–5.2)
BILIRUBIN DIRECT: 0.1 mg/dL (ref 0.0–0.5)
BILIRUBIN INDIRECT: 0.3 mg/dL (ref 0.3–0.9)
Total Bilirubin: 0.4 mg/dL (ref 0.3–1.2)
Total Protein: 7 g/dL (ref 6.0–8.3)

## 2014-05-21 LAB — BRAIN NATRIURETIC PEPTIDE: B Natriuretic Peptide: 33 pg/mL (ref 0.0–100.0)

## 2014-05-21 NOTE — Discharge Instructions (Signed)
Chest Pain (Nonspecific) °It is often hard to give a specific diagnosis for the cause of chest pain. There is always a chance that your pain could be related to something serious, such as a heart attack or a blood clot in the lungs. You need to follow up with your health care provider for further evaluation. °CAUSES  °· Heartburn. °· Pneumonia or bronchitis. °· Anxiety or stress. °· Inflammation around your heart (pericarditis) or lung (pleuritis or pleurisy). °· A blood clot in the lung. °· A collapsed lung (pneumothorax). It can develop suddenly on its own (spontaneous pneumothorax) or from trauma to the chest. °· Shingles infection (herpes zoster virus). °The chest wall is composed of bones, muscles, and cartilage. Any of these can be the source of the pain. °· The bones can be bruised by injury. °· The muscles or cartilage can be strained by coughing or overwork. °· The cartilage can be affected by inflammation and become sore (costochondritis). °DIAGNOSIS  °Lab tests or other studies may be needed to find the cause of your pain. Your health care provider may have you take a test called an ambulatory electrocardiogram (ECG). An ECG records your heartbeat patterns over a 24-hour period. You may also have other tests, such as: °· Transthoracic echocardiogram (TTE). During echocardiography, sound waves are used to evaluate how blood flows through your heart. °· Transesophageal echocardiogram (TEE). °· Cardiac monitoring. This allows your health care provider to monitor your heart rate and rhythm in real time. °· Holter monitor. This is a portable device that records your heartbeat and can help diagnose heart arrhythmias. It allows your health care provider to track your heart activity for several days, if needed. °· Stress tests by exercise or by giving medicine that makes the heart beat faster. °TREATMENT  °· Treatment depends on what may be causing your chest pain. Treatment may include: °· Acid blockers for  heartburn. °· Anti-inflammatory medicine. °· Pain medicine for inflammatory conditions. °· Antibiotics if an infection is present. °· You may be advised to change lifestyle habits. This includes stopping smoking and avoiding alcohol, caffeine, and chocolate. °· You may be advised to keep your head raised (elevated) when sleeping. This reduces the chance of acid going backward from your stomach into your esophagus. °Most of the time, nonspecific chest pain will improve within 2-3 days with rest and mild pain medicine.  °HOME CARE INSTRUCTIONS  °· If antibiotics were prescribed, take them as directed. Finish them even if you start to feel better. °· For the next few days, avoid physical activities that bring on chest pain. Continue physical activities as directed. °· Do not use any tobacco products, including cigarettes, chewing tobacco, or electronic cigarettes. °· Avoid drinking alcohol. °· Only take medicine as directed by your health care provider. °· Follow your health care provider's suggestions for further testing if your chest pain does not go away. °· Keep any follow-up appointments you made. If you do not go to an appointment, you could develop lasting (chronic) problems with pain. If there is any problem keeping an appointment, call to reschedule. °SEEK MEDICAL CARE IF:  °· Your chest pain does not go away, even after treatment. °· You have a rash with blisters on your chest. °· You have a fever. °SEEK IMMEDIATE MEDICAL CARE IF:  °· You have increased chest pain or pain that spreads to your arm, neck, jaw, back, or abdomen. °· You have shortness of breath. °· You have an increasing cough, or you cough   up blood. °· You have severe back or abdominal pain. °· You feel nauseous or vomit. °· You have severe weakness. °· You faint. °· You have chills. °This is an emergency. Do not wait to see if the pain will go away. Get medical help at once. Call your local emergency services (911 in [place] Do not drive  yourself to the hospital. °MAKE SURE YOU:  °· Understand these instructions. °· Will watch your condition. °· Will get help right away if you are not doing well or get worse. °Document Released: 12/08/2004 Document Revised: 03/05/2013 Document Reviewed: 10/04/2007 °[employer]® Patient Information ©2015 [employer]. This information is not intended to replace advice given to you by your health care provider. Make sure you discuss any questions you have with your health care provider. °General Headache Without Cause °A headache is pain or discomfort felt around the head or neck area. The specific cause of a headache may not be found. There are many causes and types of headaches. A few common ones are: °· Tension headaches. °· Migraine headaches. °· Cluster headaches. °· Chronic daily headaches. °HOME CARE INSTRUCTIONS  °· Keep all follow-up appointments with your caregiver or any specialist referral. °· Only take over-the-counter or prescription medicines for pain or discomfort as directed by your caregiver. °· Lie down in a dark, quiet room when you have a headache. °· Keep a headache journal to find out what may trigger your migraine headaches. For example, write down: °¨ What you eat and drink. °¨ How much sleep you get. °¨ Any change to your diet or medicines. °· Try massage or other relaxation techniques. °· Put ice packs or heat on the head and neck. Use these 3 to 4 times per day for 15 to 20 minutes each time, or as needed. °· Limit stress. °· Sit up straight, and do not tense your muscles. °· Quit smoking if you smoke. °· Limit alcohol use. °· Decrease the amount of caffeine you drink, or stop drinking caffeine. °· Eat and sleep on a regular schedule. °· Get 7 to 9 hours of sleep, or as recommended by your caregiver. °· Keep lights dim if bright lights bother you and make your headaches worse. °SEEK MEDICAL CARE IF:  °· You have problems with the medicines you were prescribed. °· Your medicines are not  working. °· You have a change from the usual headache. °· You have nausea or vomiting. °SEEK IMMEDIATE MEDICAL CARE IF:  °· Your headache becomes severe. °· You have a fever. °· You have a stiff neck. °· You have loss of vision. °· You have muscular weakness or loss of muscle control. °· You start losing your balance or have trouble walking. °· You feel faint or pass out. °· You have severe symptoms that are different from your first symptoms. °MAKE SURE YOU:  °· Understand these instructions. °· Will watch your condition. °· Will get help right away if you are not doing well or get worse. °Document Released: 02/28/2005 Document Revised: 05/23/2011 Document Reviewed: 03/16/2011 °[employer]® Patient Information ©2015 [employer]. This information is not intended to replace advice given to you by your health care provider. Make sure you discuss any questions you have with your health care provider. ° °

## 2014-05-21 NOTE — ED Provider Notes (Signed)
TIME SEEN: 3:40 AM  CHIEF COMPLAINT: Headache, chest pain, abdominal pain, tingling of the face, lesions on the scalp  HPI: Pt is a 54 y.o. female with history of morbid obesity, hypertension, cardiomyopathy status post ICD placement, continued tobacco use, COPD who presents emergency department with multiple complaints. Patient complained that she was having abdominal pain that started tonight and felt like "gas bubbles" in the middle of her stomach. States it moved up into her chest and then she started having less sided chest pain that she is unable to describe. No associated shortness of breath. No nausea, vomiting or diarrhea. No dysuria or hematuria. Patient states that she also began having tingling around her lips and felt that the left side of her lip was drawing down and she was having difficulty speaking. No numbness, focal weakness. Also complains of a occipital headache that is not thunderclap or the worst headache of her life. Also complains of "bumps" on her scalp and itching.  ROS: See HPI Constitutional: no fever  Eyes: no drainage  ENT: no runny nose   Cardiovascular:  chest pain  Resp: no SOB  GI: no vomiting GU: no dysuria Integumentary: no rash  Allergy: no hives  Musculoskeletal: no leg swelling  Neurological: no slurred speech ROS otherwise negative  PAST MEDICAL HISTORY/PAST SURGICAL HISTORY:  Past Medical History  Diagnosis Date  . Hypertension     09/2010-normal CMet and CBC; Lipid profile-116, 88, 25, 73  . Cardiomyopathy 08/2010    Presented with congestive heart failure; EF of 15% and 2012; hypotension on medication precludes optimal dosing  . Gastroesophageal reflux disease   . Pneumonia   . Obesity   . Hyperlipidemia   . Tobacco abuse     20 pack years  . CHF (congestive heart failure)   . Asthma   . Shortness of breath   . Headache(784.0)   . Arthritis   . Anxiety   . Neuropathy   . 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  Chronic systolic heart failure   . Dysrhythmia   . Automatic implantable cardioverter-defibrillator in situ     2013, july  . COPD (chronic obstructive pulmonary disease)     2013  . Bell palsy     states has had 3 episodes  . Stroke      stroke in 07/2012 and another in June, 2014  . Seizures     last one at age 52  . Neuromuscular disorder     Neuropathy, right foot and leg  . Hypothyroidism     Recent TSH was normal.    MEDICATIONS:  Prior to Admission medications   Medication Sig Start Date End Date Taking? Authorizing Provider  albuterol (PROVENTIL HFA;VENTOLIN HFA) 108 (90 BASE) MCG/ACT inhaler Inhale 2 puffs into the lungs every 6 (six) hours as needed for wheezing or shortness of breath. 07/12/12   Trinda Pascal, MD  aspirin EC 81 MG tablet Take 81 mg by mouth daily.    Historical Provider, MD  budesonide-formoterol (SYMBICORT) 80-4.5 MCG/ACT inhaler Inhale 2 puffs into the lungs 2 (two) times daily.    Historical Provider, MD  carvedilol (COREG) 12.5 MG tablet Take 25 mg by mouth 2 (two) times daily with a meal.    Historical Provider, MD  desloratadine (CLARINEX) 5 MG tablet Take 5 mg by mouth daily.    Historical Provider, MD  diclofenac sodium (VOLTAREN) 1 % GEL Apply 2 g topically 4 (four) times daily as needed (pain).  Historical Provider, MD  diphenhydrAMINE (BENADRYL) 25 mg capsule Take 25 mg by mouth every 8 (eight) hours as needed for allergies.     Historical Provider, MD  Ferrous Sulfate (IRON) 28 MG TABS Take 1 tablet by mouth daily.    Historical Provider, MD  fluticasone (FLONASE) 50 MCG/ACT nasal spray Place into both nostrils daily.    Historical Provider, MD  furosemide (LASIX) 40 MG tablet Take 1 tablet (40 mg total) by mouth 2 (two) times daily. 08/29/12   Yehuda Savannah, MD  gabapentin (NEURONTIN) 600 MG tablet Take 600 mg by mouth 3 (three) times daily. Patient takes 1 tablet in the morning and in the afternoon, then takes 2 tablets at bedtime     Historical Provider, MD  levofloxacin (LEVAQUIN) 500 MG tablet Take 1 tablet (500 mg total) by mouth daily. Patient not taking: Reported on 03/12/2014 01/22/14   Rosita Fire, MD  levothyroxine (SYNTHROID, LEVOTHROID) 200 MCG tablet Take 200 mcg by mouth daily before breakfast. Takes with 7mcg to equal 264mcg    Historical Provider, MD  levothyroxine (SYNTHROID, LEVOTHROID) 25 MCG tablet Take 25 mcg by mouth daily before breakfast. Takes with 275mcg to equal 223mcg    Historical Provider, MD  lisinopril (PRINIVIL,ZESTRIL) 10 MG tablet Take 1 tablet (10 mg total) by mouth daily. 08/29/12   Yehuda Savannah, MD  lovastatin (MEVACOR) 40 MG tablet Take 1 tablet (40 mg total) by mouth at bedtime. 08/29/12   Yehuda Savannah, MD  oxymetazoline (AFRIN) 0.05 % nasal spray Place 2 sprays into the nose daily as needed for congestion.    Historical Provider, MD  potassium chloride SA (K-DUR,KLOR-CON) 20 MEQ tablet Take 2 tablets (40 mEq total) by mouth 2 (two) times daily. 08/29/12   Yehuda Savannah, MD  predniSONE (STERAPRED UNI-PAK) 10 MG tablet Take by mouth daily. 4 tab po daily for 3 days, then 3 tab po daily for 3 days, 2 tab po daily for 3 days, 1 tab po daily for 3 days. Patient not taking: Reported on 03/12/2014 01/22/14   Rosita Fire, MD  ranitidine (ZANTAC) 150 MG tablet Take 150 mg by mouth at bedtime.     Historical Provider, MD  tiotropium (SPIRIVA) 18 MCG inhalation capsule Place 18 mcg into inhaler and inhale daily.    Historical Provider, MD    ALLERGIES:  Allergies  Allergen Reactions  . Fish Allergy Anaphylaxis  . Iodinated Diagnostic Agents Hives and Other (See Comments)    Pulmonary problems; no frank respiratory arrest  . Iodine Hives and Other (See Comments)    Pulmonary Problems   . Lentil Anaphylaxis  . Penicillins Anaphylaxis and Shortness Of Breath    Hair loss  . Lipitor [Atorvastatin] Itching and Rash  . Spironolactone Rash  . Sulfa Antibiotics Other (See Comments)     Unknown    SOCIAL HISTORY:  History  Substance Use Topics  . Smoking status: Current Every Day Smoker -- 0.50 packs/day for 21 years    Types: Cigarettes  . Smokeless tobacco: Never Used     Comment: 5 ciggs daily 11-26-12  . Alcohol Use: No    FAMILY HISTORY: Family History  Problem Relation Age of Onset  . Cardiomyopathy Mother     ICD pacemaker-ischmic CM  . Heart failure Mother   . Hypertension Mother   . Diabetes Mother   . Thyroid disease Mother   . Cardiomyopathy Father     Deceased  . Coronary artery disease Father   .  Heart failure Father   . Heart failure Brother   . Hypertension Brother   . Endometriosis Sister   . Colon cancer Neg Hx   . Liver disease Neg Hx     EXAM: BP 108/82 mmHg  Pulse 80  Temp(Src) 98.5 F (36.9 C) (Oral)  Resp 20  Ht 5' 7.5" (1.715 m)  Wt 295 lb (133.811 kg)  BMI 45.49 kg/m2  SpO2 98% CONSTITUTIONAL: Alert and oriented and responds appropriately to questions. Well-appearing; well-nourished, morbidly obese, in no distress HEAD: Normocephalic, patient has dry skin noted on her scalp and dandruff without any synovitis or other lesions or infection EYES: Conjunctivae clear, PERRL ENT: normal nose; no rhinorrhea; moist mucous membranes; pharynx without lesions noted NECK: Supple, no meningismus, no LAD  CARD: RRR; S1 and S2 appreciated; no murmurs, no clicks, no rubs, no gallops RESP: Normal chest excursion without splinting or tachypnea; breath sounds clear and equal bilaterally; no wheezes, no rhonchi, no rales, no hypoxia or respiratory distress ABD/GI: Normal bowel sounds; non-distended; soft, non-tender, no rebound, no guarding BACK:  The back appears normal and is non-tender to palpation, there is no CVA tenderness EXT: Normal ROM in all joints; non-tender to palpation; no edema; normal capillary refill; no cyanosis    SKIN: Normal color for age and race; warm NEURO: Moves all extremities equally, sensation to light touch  intact diffusely, cranial nerves II through XII intact, patient will draw the left side of her lip down when you were looking directly at it but then while she is talking her lips are normal and she has no facial asymmetry, no pronator drift, NIH stroke scale is 0 PSYCH: The patient's mood and manner are appropriate. Grooming and personal hygiene are appropriate.  MEDICAL DECISION MAKING: Patient here with multiple atypical symptoms. She does have a history of hypertension, tobacco use, cardiomyopathy and COPD. Will obtain cardiac labs, chest x-ray. EKG shows nonspecific T-wave abnormalities in inferior and lateral leads that is similar compared to prior EKGs. She also complains that her left lip is "drawing down" but when she is distracted she has a completely normal neurologic exam. Will obtain a CT of her head but I have very low suspicion for stroke as I think that this is intentional. Her NIH stroke scale is 0. Anticipate if patient's workup is unremarkable that she will be discharged home.  ED PROGRESS: Patient's labs are unremarkable. BNP 33. Troponin negative. CT of her head shows no acute abnormalities and chest x-ray shows no edema or infiltrates. Patient's symptoms are very atypical. I do not think that this is ACS or a stroke. Will repeat second troponin and 3 hours and if negative will discharge home with outpatient follow-up.   7:30 AM  Pt is still hemodynamically stable, neurologically intact. Second troponin negative. Will discharge home with outpatient follow-up. Discussed return precautions. Patient verbalizes understanding and is comfortable with plan.     EKG Interpretation  Date/Time:  Wednesday May 21 2014 03:41:46 EST Ventricular Rate:  79 PR Interval:  152 QRS Duration: 108 QT Interval:  402 QTC Calculation: 461 R Axis:   -13 Text Interpretation:  Sinus rhythm Borderline repolarization abnormality No significant change since August 2015 Confirmed by [facility] [employer],  DO, Tamika Shropshire  [phone]) on 05/21/2014 4:00:25 AM        [place], DO 05/21/14 0730

## 2014-05-21 NOTE — ED Notes (Signed)
Pt c/o left chest pain that started tonight.

## 2014-05-27 DIAGNOSIS — E039 Hypothyroidism, unspecified: Secondary | ICD-10-CM | POA: Diagnosis not present

## 2014-05-27 DIAGNOSIS — I10 Essential (primary) hypertension: Secondary | ICD-10-CM | POA: Diagnosis not present

## 2014-05-27 DIAGNOSIS — F17200 Nicotine dependence, unspecified, uncomplicated: Secondary | ICD-10-CM | POA: Diagnosis not present

## 2014-05-29 DIAGNOSIS — E039 Hypothyroidism, unspecified: Secondary | ICD-10-CM | POA: Diagnosis not present

## 2014-05-29 DIAGNOSIS — I10 Essential (primary) hypertension: Secondary | ICD-10-CM | POA: Diagnosis not present

## 2014-05-29 DIAGNOSIS — E559 Vitamin D deficiency, unspecified: Secondary | ICD-10-CM | POA: Diagnosis not present

## 2014-05-29 DIAGNOSIS — E785 Hyperlipidemia, unspecified: Secondary | ICD-10-CM | POA: Diagnosis not present

## 2014-06-06 ENCOUNTER — Encounter: Payer: Medicaid Other | Admitting: Internal Medicine

## 2014-07-02 ENCOUNTER — Ambulatory Visit (INDEPENDENT_AMBULATORY_CARE_PROVIDER_SITE_OTHER): Payer: Medicare Other | Admitting: Internal Medicine

## 2014-07-02 ENCOUNTER — Encounter: Payer: Medicare Other | Attending: "Endocrinology | Admitting: Nutrition

## 2014-07-02 ENCOUNTER — Encounter: Payer: Self-pay | Admitting: Internal Medicine

## 2014-07-02 VITALS — BP 86/60 | HR 76 | Ht 67.0 in | Wt 295.0 lb

## 2014-07-02 DIAGNOSIS — E669 Obesity, unspecified: Secondary | ICD-10-CM | POA: Diagnosis not present

## 2014-07-02 DIAGNOSIS — R739 Hyperglycemia, unspecified: Secondary | ICD-10-CM

## 2014-07-02 DIAGNOSIS — I429 Cardiomyopathy, unspecified: Secondary | ICD-10-CM | POA: Diagnosis not present

## 2014-07-02 DIAGNOSIS — I5022 Chronic systolic (congestive) heart failure: Secondary | ICD-10-CM

## 2014-07-02 DIAGNOSIS — Z95810 Presence of automatic (implantable) cardiac defibrillator: Secondary | ICD-10-CM

## 2014-07-02 DIAGNOSIS — I480 Paroxysmal atrial fibrillation: Secondary | ICD-10-CM

## 2014-07-02 LAB — MDC_IDC_ENUM_SESS_TYPE_INCLINIC
Battery Remaining Longevity: 76.8 mo
Brady Statistic RV Percent Paced: 0 %
HighPow Impedance: 82.125
Implantable Pulse Generator Serial Number: 1022442
Lead Channel Impedance Value: 587.5 Ohm
Lead Channel Pacing Threshold Amplitude: 0.5 V
Lead Channel Pacing Threshold Pulse Width: 0.5 ms
Lead Channel Setting Pacing Amplitude: 2.5 V
Lead Channel Setting Pacing Pulse Width: 0.5 ms
Lead Channel Setting Sensing Sensitivity: 0.5 mV
MDC IDC MSMT LEADCHNL RV SENSING INTR AMPL: 12 mV
MDC IDC SESS DTM: 20160420102858
Zone Setting Detection Interval: 300 ms

## 2014-07-02 NOTE — Patient Instructions (Addendum)
Goals Cut out Crystal tlight and drink 5 bottles of water per day instead. 2. Increase exdrcise 45 minutes  3. Cut out cheese on burger 4. Vinegarrette dressing 5. Cut out mayonaise 6. Lose 1 lb per week til next visit.  7. Change to 1% milk.

## 2014-07-02 NOTE — Assessment & Plan Note (Signed)
Her symptoms are class 2. We discussed the importance of continuing her current meds and losing weight and increasing her physical activity.

## 2014-07-02 NOTE — Assessment & Plan Note (Signed)
Her blood pressure is actually on low side. She is not symptomatic. No change in meds.

## 2014-07-02 NOTE — Patient Instructions (Signed)
Your physician wants you to follow-up in: 1 year with Dr. Lovena Le. You will receive a reminder letter in the mail two months in advance. If you don't receive a letter, please call our office to schedule the follow-up appointment.  Remote monitoring is used to monitor your Pacemaker of ICD from home. This monitoring reduces the number of office visits required to check your device to one time per year. It allows [place] to keep an eye on the functioning of your device to ensure it is working properly. You are scheduled for a device check from home on 10/01/14. You may send your transmission at any time that day. If you have a wireless device, the transmission will be sent automatically. After your physician reviews your transmission, you will receive a postcard with your next transmission date.  Your physician recommends that you continue on your current medications as directed. Please refer to the Current Medication list given to you today.  Thank you for choosing [facility]!

## 2014-07-02 NOTE — Assessment & Plan Note (Signed)
I have recommended she get a FitBit to monitor her activity. She is seeing a dietician. Hopefully she can lose weight.

## 2014-07-02 NOTE — Assessment & Plan Note (Signed)
Her [employer] ICD is working normally. Will recheck in several months. 

## 2014-07-02 NOTE — Progress Notes (Signed)
HPI [name] returns today for followup. She is a very pleasant 54 year old woman with an ischemic cardiomyopathy, chronic systolic heart failure, severe back pain, hypertension, and COPD. She denies chest pain or shortness of breath. No ICD shock. No peripheral edema. She continues to smoke cigarettes but has reduced her consumption down to a half pack a day. She is trying to lose weight. She is frustrated by her inability and is seeing a dietician. Allergies  Allergen Reactions  . Fish Allergy Anaphylaxis  . Iodinated Diagnostic Agents Hives and Other (See Comments)    Pulmonary problems; no frank respiratory arrest  . Iodine Hives and Other (See Comments)    Pulmonary Problems   . Lentil Anaphylaxis  . Penicillins Anaphylaxis and Shortness Of Breath    Hair loss  . Lipitor [Atorvastatin] Itching and Rash  . Spironolactone Rash  . Sulfa Antibiotics Other (See Comments)    Unknown     Current Outpatient Prescriptions  Medication Sig Dispense Refill  . albuterol (PROVENTIL HFA;VENTOLIN HFA) 108 (90 BASE) MCG/ACT inhaler Inhale 2 puffs into the lungs every 6 (six) hours as needed for wheezing or shortness of breath. 1 Inhaler 5  . aspirin EC 81 MG tablet Take 81 mg by mouth daily.    . budesonide-formoterol (SYMBICORT) 80-4.5 MCG/ACT inhaler Inhale 2 puffs into the lungs 2 (two) times daily.    . carvedilol (COREG) 12.5 MG tablet Take 25 mg by mouth 2 (two) times daily with a meal.    . desloratadine (CLARINEX) 5 MG tablet Take 5 mg by mouth daily.    . diclofenac sodium (VOLTAREN) 1 % GEL Apply 2 g topically 4 (four) times daily as needed (pain).     Marland Kitchen diphenhydrAMINE (BENADRYL) 25 mg capsule Take 25 mg by mouth every 8 (eight) hours as needed for allergies.     Marland Kitchen ergocalciferol (VITAMIN D2) 50000 UNITS capsule Take 50,000 Units by mouth once a week.    . Ferrous Sulfate (IRON) 28 MG TABS Take 1 tablet by mouth daily.    . fluticasone (FLONASE) 50 MCG/ACT nasal spray Place into both  nostrils daily.    . furosemide (LASIX) 40 MG tablet Take 1 tablet (40 mg total) by mouth 2 (two) times daily. 180 tablet 0  . gabapentin (NEURONTIN) 600 MG tablet Take 600 mg by mouth 3 (three) times daily. Patient takes 1 tablet in the morning and in the afternoon, then takes 2 tablets at bedtime    . levothyroxine (SYNTHROID, LEVOTHROID) 200 MCG tablet Take 200 mcg by mouth daily before breakfast. Takes with 63mcg to equal 260mcg    . levothyroxine (SYNTHROID, LEVOTHROID) 25 MCG tablet Take 25 mcg by mouth daily before breakfast. Takes with 257mcg to equal 248mcg    . lisinopril (PRINIVIL,ZESTRIL) 10 MG tablet Take 1 tablet (10 mg total) by mouth daily. 90 tablet 0  . lovastatin (MEVACOR) 40 MG tablet Take 1 tablet (40 mg total) by mouth at bedtime. 90 tablet 0  . potassium chloride SA (K-DUR,KLOR-CON) 20 MEQ tablet Take 2 tablets (40 mEq total) by mouth 2 (two) times daily. 180 tablet 0  . ranitidine (ZANTAC) 150 MG tablet Take 150 mg by mouth at bedtime.      No current facility-administered medications for this visit.     Past Medical History  Diagnosis Date  . Hypertension     09/2010-normal CMet and CBC; Lipid profile-116, 88, 25, 73  . Cardiomyopathy 08/2010    Presented with congestive heart failure; EF of  15% and 2012; hypotension on medication precludes optimal dosing  . Gastroesophageal reflux disease   . Pneumonia   . Obesity   . Hyperlipidemia   . Tobacco abuse     20 pack years  . CHF (congestive heart failure)   . Asthma   . Shortness of breath   . Headache(784.0)   . Arthritis   . Anxiety   . Neuropathy   . 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 Chronic systolic heart failure   . Dysrhythmia   . Automatic implantable cardioverter-defibrillator in situ     2013, july  . COPD (chronic obstructive pulmonary disease)     2013  . Bell palsy     states has had 3 episodes  . Stroke      stroke in 07/2012 and another in June, 2014  . Seizures      last one at age 26  . Neuromuscular disorder     Neuropathy, right foot and leg  . Hypothyroidism     Recent TSH was normal.    ROS:   All systems reviewed and negative except as noted in the HPI.   Past Surgical History  Procedure Laterality Date  . Cesarean section      X2  . Tee without cardioversion N/A 07/11/2012    Procedure: TRANSESOPHAGEAL ECHOCARDIOGRAM (TEE);  Surgeon: Thayer Headings, MD;  Location: [place];  Service: Cardiovascular;  Laterality: N/A;  . Tubal ligation    . Multiple extractions with alveoloplasty N/A 09/24/2012    Procedure: MULTIPLE EXTRACION #2, 4, 6, 7 ,8, 9, 11, 13, 18, 20, 21, 22, 23, 24, 25, 26, 27, 29 WITH ALVEOLOPLASTY, BIOPSY OF PALATE LESION, REMOVA RIGHT LINGUAL TORUS;  Surgeon: Gae Bon, DDS;  Location: [place];  Service: Oral Surgery;  Laterality: N/A;  . Ep implantable device      St. Jude  . Colonoscopy N/A 12/16/2013    Procedure: COLONOSCOPY;  Surgeon: Danie Binder, MD;  [facility];  Service: Endoscopy;  Laterality: N/A;  10:45-moved to 10/5 @ [place] notified pt  . Implantable cardioverter defibrillator implant N/A 10/10/2011    Procedure: IMPLANTABLE CARDIOVERTER DEFIBRILLATOR IMPLANT;  Surgeon: Evans Lance, MD;  [facility];  Service: Cardiovascular;  Laterality: N/A;     Family History  Problem Relation Age of Onset  . Cardiomyopathy Mother     ICD pacemaker-ischmic CM  . Heart failure Mother   . Hypertension Mother   . Diabetes Mother   . Thyroid disease Mother   . Cardiomyopathy Father     Deceased  . Coronary artery disease Father   . Heart failure Father   . Heart failure Brother   . Hypertension Brother   . Endometriosis Sister   . Colon cancer Neg Hx   . Liver disease Neg Hx      History   Social History  . Marital Status: Divorced    Spouse Name: N/A  . Number of Children: 2  . Years of Education: N/A   Occupational History  .      Works in Environmental consultant    Social History Main Topics  . Smoking status: Current Every Day Smoker -- 0.50 packs/day for 21 years    Types: Cigarettes    Start date: 07/02/1983  . Smokeless tobacco: Never Used     Comment: 5 ciggs daily 11-26-12  . Alcohol Use: No  . Drug Use: No  . Sexual Activity:  No   Other Topics Concern  . Not on file   Social History Narrative     BP 86/60 mmHg  Pulse 76  Ht 5\' 7"  (1.702 m)  Wt 295 lb (133.811 kg)  BMI 46.19 kg/m2  SpO2 98%  Physical Exam:  obese appearing middle aged woman, NAD HEENT: Unremarkable Neck:  No JVD, no thyromegally Back:  No CVA tenderness Lungs:  Clear with no wheezes HEART:  Regular rate rhythm, no murmurs, no rubs, no clicks Abd:  soft, positive bowel sounds, no organomegally, no rebound, no guarding Ext:  2 plus pulses, no edema, no cyanosis, no clubbing Skin:  No rashes no nodules Neuro:  CN II through XII intact, motor grossly intact   DEVICE  Normal device function.  See PaceArt for details.   Assess/Plan:

## 2014-07-02 NOTE — Progress Notes (Signed)
  Medical Nutrition Therapy:  Appt start [id number]  end time: 0945.  Assessment:  Primary concerns today: Obesity.  Changes made: eating more salads, bran flakes, boiled eggs and more whole wheat bread She is eating a lot of salads. Exercise walking 30 minuts 4 days a week with her Aide. Has a AIDE that assists 2 hours daily.     No weight loss since last visit. Brought her food journal. Getting excess calories from cheese, high calorie salad dressings and larger portions. Needs to cut down on portions.  Hasn't quit smoking yet but is working on it.  Preferred Learning Style:     No preference indicated   Learning Readiness:    Ready  Change in progress   MEDICATIONS: see list   DIETARY INTAKE:  24-hr recall:  B ( AM): 2 eggs, 1 cup brain flakes, 2%milk, Snk ( AM): none  L ( PM):chicken, turnip greens, potatoes, and peach and water Snk ( PM):  D ( PM): Baked chicken, toss salad, broccoli/squash.  corn, bverages: water, and unsweet tea  Usual physical activity: walking 30 minutes twice a week .   Estimated energy needs: 1600 calories 180 g carbohydrates 120 g protein 44 g fat  Progress Towards Goal(s):  In progress.   Nutritional Diagnosis:  NB-1.1 Food and nutrition-related knowledge deficit As related to Obesity.  As evidenced by BMI > 40.    Intervention:  Nutrition counseling on healthy weight loss tips, ways to prevent diabetes and importance of exercise. Plan Goal Cut out Crystlight and drink 5 bottles of water instead. 2. Increase exercise to 45 minutes 4 days per week. 3. Cut out cheese on burgers 4. Vinegarrette dressing instead of [place] onion. 5. Cut out mayonaise 6. Lose 1 lb per week til next week.  7. Change to 1% milk.  Keep up the good work with cutting out processed foods and eating more fresh vegetables.  Keep food journal and bring at next visit. Continue to work on quitting smoking.  Teaching Method Utilized:   Visual Auditory Hands on  Handouts given during visit include: The Plate Method Healthy Weight loss tips Meal Plan Card  Barriers to learning/adherence to lifestyle change: none  Demonstrated degree of understanding via:  Teach Back   Monitoring/Evaluation:  Dietary intake, exercise, meal planning, and body weight in 1 month(s).

## 2014-07-22 ENCOUNTER — Encounter: Payer: Self-pay | Admitting: Internal Medicine

## 2014-07-24 ENCOUNTER — Ambulatory Visit (INDEPENDENT_AMBULATORY_CARE_PROVIDER_SITE_OTHER): Payer: Medicare Other | Admitting: Otolaryngology

## 2014-07-24 DIAGNOSIS — J343 Hypertrophy of nasal turbinates: Secondary | ICD-10-CM | POA: Diagnosis not present

## 2014-07-24 DIAGNOSIS — J342 Deviated nasal septum: Secondary | ICD-10-CM | POA: Diagnosis not present

## 2014-08-19 DIAGNOSIS — I10 Essential (primary) hypertension: Secondary | ICD-10-CM | POA: Diagnosis not present

## 2014-08-19 DIAGNOSIS — F17200 Nicotine dependence, unspecified, uncomplicated: Secondary | ICD-10-CM | POA: Diagnosis not present

## 2014-08-19 DIAGNOSIS — J449 Chronic obstructive pulmonary disease, unspecified: Secondary | ICD-10-CM | POA: Diagnosis not present

## 2014-08-19 DIAGNOSIS — I499 Cardiac arrhythmia, unspecified: Secondary | ICD-10-CM | POA: Diagnosis not present

## 2014-08-27 DIAGNOSIS — E785 Hyperlipidemia, unspecified: Secondary | ICD-10-CM | POA: Diagnosis not present

## 2014-08-27 DIAGNOSIS — R739 Hyperglycemia, unspecified: Secondary | ICD-10-CM | POA: Diagnosis not present

## 2014-08-27 DIAGNOSIS — E039 Hypothyroidism, unspecified: Secondary | ICD-10-CM | POA: Diagnosis not present

## 2014-09-02 ENCOUNTER — Other Ambulatory Visit (HOSPITAL_COMMUNITY): Payer: Self-pay | Admitting: Internal Medicine

## 2014-09-02 DIAGNOSIS — Z1231 Encounter for screening mammogram for malignant neoplasm of breast: Secondary | ICD-10-CM

## 2014-09-03 DIAGNOSIS — E785 Hyperlipidemia, unspecified: Secondary | ICD-10-CM | POA: Diagnosis not present

## 2014-09-03 DIAGNOSIS — E039 Hypothyroidism, unspecified: Secondary | ICD-10-CM | POA: Diagnosis not present

## 2014-09-03 DIAGNOSIS — E559 Vitamin D deficiency, unspecified: Secondary | ICD-10-CM | POA: Diagnosis not present

## 2014-09-10 ENCOUNTER — Encounter: Payer: Medicare Other | Attending: "Endocrinology | Admitting: Nutrition

## 2014-09-10 ENCOUNTER — Encounter: Payer: Self-pay | Admitting: Nutrition

## 2014-09-10 VITALS — Ht 67.5 in | Wt 293.2 lb

## 2014-09-10 DIAGNOSIS — E669 Obesity, unspecified: Secondary | ICD-10-CM

## 2014-09-10 DIAGNOSIS — E785 Hyperlipidemia, unspecified: Secondary | ICD-10-CM

## 2014-09-10 DIAGNOSIS — R739 Hyperglycemia, unspecified: Secondary | ICD-10-CM

## 2014-09-10 NOTE — Progress Notes (Signed)
  Medical Nutrition Therapy:  Appt start [id number]  end time: 0945.  Assessment:  Primary concerns today: Obesity and prediabetes follow up.  Lost 2 lbs. Can't walk as much with her aide due to limited hours. Willing to do more exercises on her own at home. Brought her food journal. Getting excess calories from cheese, high calorie salad dressings and larger portions. Needs to cut down on portions and increase physical activity. Suggested she look into the [facility] water aerobics..  Hasn't quit smoking yet but is working on it.  Preferred Learning Style:     No preference indicated   Learning Readiness:    Ready  Change in progress   MEDICATIONS: see list   DIETARY INTAKE:  24-hr recall:  B ( AM): 2 eggs, 1 cup brain flakes, 2%milk, Snk ( AM): none  L ( PM): Chicken breast, rice, green beans, water Snk ( PM): none D ( PM): Brown rice, chicken and collard greens, water bverages: water, and unsweet tea  Usual physical activity: walking 30 minutes twice a week .   Estimated energy needs: 1600 calories 180 g carbohydrates 120 g protein 44 g fat  Progress Towards Goal(s):  In progress.   Nutritional Diagnosis:  NB-1.1 Food and nutrition-related knowledge deficit As related to Obesity.  As evidenced by BMI > 40.    Intervention:  Nutrition counseling on healthy weight loss tips, ways to prevent diabetes and importance of exercise. Low sodium, low fat, high fiber diet and meal planning and exercises for needed weight loss. Plan Goals: Follow low fat high fiber low sodium diet.  1.  Increase exercise--see about water aerobics at the [facility] . 2. Avoid snacks between meals and late at night.. 3. Reduce  portion sizes. 4. Continue to drink only water. 5. Increase exercise to 45 minutes 4 days per  6. Lose 1 lb per week/- 3-4 lbs per month. 7. Quit smoking.   Keep up the good work with cutting out processed foods and eating more fresh vegetables.  Keep food journal and bring  at next visit. Continue to work on quitting smoking.  Teaching Method Utilized:  Visual Auditory Hands on  Handouts given during visit include: Healthy Weight loss tips Chair exercises  Barriers to learning/adherence to lifestyle change: none  Demonstrated degree of understanding via:  Teach Back   Monitoring/Evaluation:  Dietary intake, exercise, meal planning, and body weight in 3 month(s).

## 2014-09-11 NOTE — Patient Instructions (Signed)
Plan Goals: Follow low fat high fiber low sodium diet.  1.  Increase exercise--see about water aerobics at the [facility] . 2. Avoid snacks between meals and late at night.. 3. Reduce  portion sizes. 4. Continue to drink only water. 5. Increase exercise to 45 minutes 4 days per  6. Lose 1 lb per week/- 3-4 lbs per month. 7. Quit smoking.

## 2014-09-26 ENCOUNTER — Telehealth: Payer: Self-pay | Admitting: Internal Medicine
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
## 2014-09-26 NOTE — Telephone Encounter (Signed)
New Message        Pt calling wanting to make sure that her device is working properly. Please call back and advise.

## 2014-10-01 ENCOUNTER — Ambulatory Visit (INDEPENDENT_AMBULATORY_CARE_PROVIDER_SITE_OTHER): Payer: Medicare Other | Admitting: *Deleted

## 2014-10-01 DIAGNOSIS — I429 Cardiomyopathy, unspecified: Secondary | ICD-10-CM | POA: Diagnosis not present

## 2014-10-01 NOTE — Progress Notes (Signed)
Remote ICD transmission.   

## 2014-10-06 ENCOUNTER — Other Ambulatory Visit (HOSPITAL_COMMUNITY)
Admission: RE | Admit: 2014-10-06 | Discharge: 2014-10-06 | Disposition: A | Discharge status: Home / Self Care | Payer: Medicare Other | Source: Ambulatory Visit | Attending: Obstetrics & Gynecology | Admitting: Obstetrics & Gynecology

## 2014-10-06 ENCOUNTER — Ambulatory Visit (INDEPENDENT_AMBULATORY_CARE_PROVIDER_SITE_OTHER): Payer: Medicare Other | Admitting: Obstetrics & Gynecology

## 2014-10-06 ENCOUNTER — Encounter: Payer: Self-pay | Admitting: Obstetrics & Gynecology

## 2014-10-06 VITALS — BP 100/80 | HR 80 | Ht 67.2 in | Wt 293.0 lb

## 2014-10-06 DIAGNOSIS — Z01419 Encounter for gynecological examination (general) (routine) without abnormal findings: Secondary | ICD-10-CM

## 2014-10-06 DIAGNOSIS — Z01411 Encounter for gynecological examination (general) (routine) with abnormal findings: Secondary | ICD-10-CM | POA: Insufficient documentation

## 2014-10-06 DIAGNOSIS — Z124 Encounter for screening for malignant neoplasm of cervix: Secondary | ICD-10-CM

## 2014-10-06 DIAGNOSIS — Z1151 Encounter for screening for human papillomavirus (HPV): Secondary | ICD-10-CM | POA: Insufficient documentation

## 2014-10-06 DIAGNOSIS — Z1212 Encounter for screening for malignant neoplasm of rectum: Secondary | ICD-10-CM

## 2014-10-06 NOTE — Progress Notes (Signed)
Patient ID: Melanie Cordova, female   DOB: 12-Mar-1961, 54 y.o.   MRN: [id number] Subjective:     Melanie Cordova is a 54 y.o. female here for a routine exam.  No LMP recorded. Patient is postmenopausal. No obstetric history on file. Birth Control Method:  postmenopausal Menstrual Calendar(currently): No LMP recorded. Patient is postmenopausal.   Current complaints: none.   Current acute medical issues:  Continues to smoke 1ppd.     Recent Gynecologic History No LMP recorded. Patient is postmenopausal. Last Pap: 09/2013, negative Last mammogram: 09/2013,  normal  Past Medical History  Diagnosis Date  . Hypertension     09/2010-normal CMet and CBC; Lipid profile-116, 88, 25, 73  . Cardiomyopathy 08/2010    Presented with congestive heart failure; EF of 15% and 2012; hypotension on medication precludes optimal dosing  . Gastroesophageal reflux disease   . Pneumonia   . Obesity   . Hyperlipidemia   . Tobacco abuse     20 pack years  . CHF (congestive heart failure)   . Asthma   . Shortness of breath   . Headache(784.0)   . Arthritis   . Anxiety   . Neuropathy   . 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 Chronic systolic heart failure   . Dysrhythmia   . Automatic implantable cardioverter-defibrillator in situ     2013, july  . COPD (chronic obstructive pulmonary disease)     2013  . Bell palsy     states has had 3 episodes  . Stroke      stroke in 07/2012 and another in June, 2014  . Seizures     last one at age 25  . Neuromuscular disorder     Neuropathy, right foot and leg  . Hypothyroidism     Recent TSH was normal.    Past Surgical History  Procedure Laterality Date  . Cesarean section      X2  . Tee without cardioversion N/A 07/11/2012    Procedure: TRANSESOPHAGEAL ECHOCARDIOGRAM (TEE);  Surgeon: Thayer Headings, MD;  Location: [place];  Service: Cardiovascular;  Laterality: N/A;  . Tubal ligation    . Multiple extractions with  alveoloplasty N/A 09/24/2012    Procedure: MULTIPLE EXTRACION #2, 4, 6, 7 ,8, 9, 11, 13, 18, 20, 21, 22, 23, 24, 25, 26, 27, 29 WITH ALVEOLOPLASTY, BIOPSY OF PALATE LESION, REMOVA RIGHT LINGUAL TORUS;  Surgeon: Gae Bon, DDS;  Location: [place];  Service: Oral Surgery;  Laterality: N/A;  . Ep implantable device      St. Jude  . Colonoscopy N/A 12/16/2013    Procedure: COLONOSCOPY;  Surgeon: Danie Binder, MD;  [facility];  Service: Endoscopy;  Laterality: N/A;  10:45-moved to 10/5 @ [place] notified pt  . Implantable cardioverter defibrillator implant N/A 10/10/2011    Procedure: IMPLANTABLE CARDIOVERTER DEFIBRILLATOR IMPLANT;  Surgeon: Evans Lance, MD;  Location: [facility] CATH LAB;  Service: Cardiovascular;  Laterality: N/A;    OB History    No data available      History   Social History  . Marital Status: Divorced    Spouse Name: N/A  . Number of Children: 2  . Years of Education: N/A   Occupational History  .      Works in Environmental consultant   Social History Main Topics  . Smoking status: Current Every Day Smoker -- 0.50 packs/day for 21 years    Types: Cigarettes  Start date: 07/02/1983  . Smokeless tobacco: Never Used     Comment: 5 ciggs daily 11-26-12  . Alcohol Use: No  . Drug Use: No  . Sexual Activity: No   Other Topics Concern  . None   Social History Narrative    Family History  Problem Relation Age of Onset  . Cardiomyopathy Mother     ICD pacemaker-ischmic CM  . Heart failure Mother   . Hypertension Mother   . Diabetes Mother   . Thyroid disease Mother   . Cardiomyopathy Father     Deceased  . Coronary artery disease Father   . Heart failure Father   . Heart failure Brother   . Hypertension Brother   . Endometriosis Sister   . Colon cancer Neg Hx   . Liver disease Neg Hx      Current outpatient prescriptions:  .  albuterol (PROVENTIL HFA;VENTOLIN HFA) 108 (90 BASE) MCG/ACT inhaler, Inhale 2 puffs into the lungs every 6  (six) hours as needed for wheezing or shortness of breath., Disp: 1 Inhaler, Rfl: 5 .  aspirin EC 81 MG tablet, Take 81 mg by mouth daily., Disp: , Rfl:  .  budesonide-formoterol (SYMBICORT) 80-4.5 MCG/ACT inhaler, Inhale 2 puffs into the lungs 2 (two) times daily., Disp: , Rfl:  .  carvedilol (COREG) 12.5 MG tablet, Take 25 mg by mouth 2 (two) times daily with a meal., Disp: , Rfl:  .  desloratadine (CLARINEX) 5 MG tablet, Take 5 mg by mouth daily., Disp: , Rfl:  .  diclofenac sodium (VOLTAREN) 1 % GEL, Apply 2 g topically 4 (four) times daily as needed (pain). , Disp: , Rfl:  .  diphenhydrAMINE (BENADRYL) 25 mg capsule, Take 25 mg by mouth every 8 (eight) hours as needed for allergies. , Disp: , Rfl:  .  ergocalciferol (VITAMIN D2) 50000 UNITS capsule, Take 50,000 Units by mouth once a week., Disp: , Rfl:  .  Ferrous Sulfate (IRON) 28 MG TABS, Take 1 tablet by mouth daily., Disp: , Rfl:  .  fluticasone (FLONASE) 50 MCG/ACT nasal spray, Place into both nostrils daily., Disp: , Rfl:  .  furosemide (LASIX) 40 MG tablet, Take 1 tablet (40 mg total) by mouth 2 (two) times daily., Disp: 180 tablet, Rfl: 0 .  gabapentin (NEURONTIN) 600 MG tablet, Take 600 mg by mouth 3 (three) times daily. Patient takes 1 tablet in the morning and in the afternoon, then takes 2 tablets at bedtime, Disp: , Rfl:  .  levothyroxine (SYNTHROID, LEVOTHROID) 200 MCG tablet, Take 200 mcg by mouth daily before breakfast. Takes with 28mcg to equal 224mcg, Disp: , Rfl:  .  levothyroxine (SYNTHROID, LEVOTHROID) 25 MCG tablet, Take 25 mcg by mouth daily before breakfast. Takes with 267mcg to equal 241mcg, Disp: , Rfl:  .  lisinopril (PRINIVIL,ZESTRIL) 10 MG tablet, Take 1 tablet (10 mg total) by mouth daily., Disp: 90 tablet, Rfl: 0 .  lovastatin (MEVACOR) 40 MG tablet, Take 1 tablet (40 mg total) by mouth at bedtime., Disp: 90 tablet, Rfl: 0 .  potassium chloride SA (K-DUR,KLOR-CON) 20 MEQ tablet, Take 2 tablets (40 mEq total) by  mouth 2 (two) times daily., Disp: 180 tablet, Rfl: 0 .  ranitidine (ZANTAC) 150 MG tablet, Take 150 mg by mouth at bedtime. , Disp: , Rfl:   Review of Systems  Review of Systems  Constitutional: Negative for fever, chills, weight loss, malaise/fatigue and diaphoresis.  HENT: Negative for hearing loss, ear pain, nosebleeds, congestion, sore throat,  neck pain, tinnitus and ear discharge.   Eyes: Negative for blurred vision, double vision, photophobia, pain, discharge and redness.  Respiratory: Negative for cough, hemoptysis, sputum production, shortness of breath, wheezing and stridor.   Cardiovascular: Negative for chest pain, palpitations, orthopnea, claudication, leg swelling and PND.  Gastrointestinal: negative for abdominal pain. Negative for heartburn, nausea, vomiting, diarrhea, constipation, blood in stool and melena.  Genitourinary: Negative for dysuria, urgency, frequency, hematuria and flank pain. +occasional hematochezia (last colonoscopy negative) Musculoskeletal: Negative for myalgias, back pain, joint pain and falls.  Skin: Negative for itching and rash.  Neurological: Negative for dizziness, tingling, tremors, sensory change, speech change, focal weakness, seizures, loss of consciousness, weakness and headaches.  Endo/Heme/Allergies: Negative for environmental allergies and polydipsia. Does not bruise/bleed easily.  Psychiatric/Behavioral: Negative for depression, suicidal ideas, hallucinations, memory loss and substance abuse. The patient is not nervous/anxious and does not have insomnia.        Objective:  Blood pressure 100/80, pulse 80, height 5' 7.2" (1.707 m), weight 293 lb (132.904 kg).   Physical Exam  Vitals reviewed. Constitutional: She is oriented to person, place, and time. She appears well-developed and well-nourished.  HENT:  Head: Normocephalic and atraumatic.        Right Ear: External ear normal.  Left Ear: External ear normal.  Nose: Nose normal.   Mouth/Throat: Oropharynx is clear and moist.  Eyes: Conjunctivae and EOM are normal. Pupils are equal, Cordova, and reactive to light. Right eye exhibits no discharge. Left eye exhibits no discharge. No scleral icterus.  Neck: Normal range of motion. Neck supple. No tracheal deviation present. No thyromegaly present.  Cardiovascular: Normal rate, regular rhythm, normal heart sounds and intact distal pulses.  Exam reveals no gallop and no friction rub.   No murmur heard. Respiratory: Effort normal and breath sounds normal. No respiratory distress. She has no wheezes. She has no rales. She exhibits no tenderness.  GI: Soft. Bowel sounds are normal. She exhibits no distension and no mass. There is no tenderness. There is no rebound and no guarding.  Genitourinary:  Breasts no masses skin changes or nipple changes bilaterally      Vulva is normal without lesions Vagina is pink moist without discharge Cervix normal in appearance and pap is done Uterus is normal size shape and contour Adnexa is negative with normal sized ovaries  Rectal hemoccult negative, normal tone, no masses  Musculoskeletal: Normal range of motion. She exhibits no edema and no tenderness.  Neurological: She is alert and oriented to person, place, and time. She has normal reflexes. She displays normal reflexes. No cranial nerve deficit. She exhibits normal muscle tone. Coordination normal.  Skin: Skin is warm and dry. No rash noted. No erythema. No pallor.  Psychiatric: She has a normal mood and affect. Her behavior is normal. Judgment and thought content normal.       Assessment:    Healthy female exam.    Plan:  Pap performed.  Hemmocult negative for blood.  Education reviewed: smoking cessation. Mammogram ordered. Follow up in: 1 year.

## 2014-10-06 NOTE — Addendum Note (Signed)
Addended by: Diona Fanti A on: 10/06/2014 09:51 AM   Modules accepted: Orders

## 2014-10-07 LAB — CYTOLOGY - PAP

## 2014-10-08 ENCOUNTER — Ambulatory Visit (HOSPITAL_COMMUNITY)
Admission: RE | Admit: 2014-10-08 | Discharge: 2014-10-08 | Disposition: A | Discharge status: Home / Self Care | Payer: Medicare Other | Source: Ambulatory Visit | Attending: Internal Medicine | Admitting: Internal Medicine

## 2014-10-08 DIAGNOSIS — Z1231 Encounter for screening mammogram for malignant neoplasm of breast: Secondary | ICD-10-CM | POA: Insufficient documentation

## 2014-10-10 LAB — CUP PACEART REMOTE DEVICE CHECK
Battery Remaining Longevity: 76 mo
Brady Statistic RV Percent Paced: 1 %
Date Time Interrogation Session: 20160720060019
HIGH POWER IMPEDANCE MEASURED VALUE: 81 Ohm
HIGH POWER IMPEDANCE MEASURED VALUE: 81 Ohm
Lead Channel Impedance Value: 560 Ohm
Lead Channel Pacing Threshold Amplitude: 0.5 V
Lead Channel Sensing Intrinsic Amplitude: 12 mV
Lead Channel Setting Pacing Amplitude: 2.5 V
Lead Channel Setting Sensing Sensitivity: 0.5 mV
MDC IDC MSMT BATTERY REMAINING PERCENTAGE: 72 %
MDC IDC MSMT BATTERY VOLTAGE: 2.96 V
MDC IDC MSMT LEADCHNL RV PACING THRESHOLD PULSEWIDTH: 0.5 ms
MDC IDC PG SERIAL: 1022442
MDC IDC SET LEADCHNL RV PACING PULSEWIDTH: 0.5 ms
MDC IDC SET ZONE DETECTION INTERVAL: 300 ms

## 2014-11-13 ENCOUNTER — Encounter: Payer: Self-pay | Admitting: Cardiology

## 2014-11-25 DIAGNOSIS — Z23 Encounter for immunization: Secondary | ICD-10-CM | POA: Diagnosis not present

## 2014-11-25 DIAGNOSIS — I10 Essential (primary) hypertension: Secondary | ICD-10-CM | POA: Diagnosis not present

## 2014-11-25 DIAGNOSIS — J449 Chronic obstructive pulmonary disease, unspecified: Secondary | ICD-10-CM | POA: Diagnosis not present

## 2014-11-25 DIAGNOSIS — F17200 Nicotine dependence, unspecified, uncomplicated: Secondary | ICD-10-CM | POA: Diagnosis not present

## 2014-11-27 ENCOUNTER — Encounter: Payer: Self-pay | Admitting: Internal Medicine

## 2014-11-27 ENCOUNTER — Encounter: Payer: Self-pay | Admitting: Cardiology

## 2014-12-01 DIAGNOSIS — M79606 Pain in leg, unspecified: Secondary | ICD-10-CM | POA: Diagnosis not present

## 2014-12-01 DIAGNOSIS — M5416 Radiculopathy, lumbar region: Secondary | ICD-10-CM | POA: Diagnosis not present

## 2014-12-01 DIAGNOSIS — M1990 Unspecified osteoarthritis, unspecified site: Secondary | ICD-10-CM | POA: Diagnosis not present

## 2014-12-01 DIAGNOSIS — F5109 Other insomnia not due to a substance or known physiological condition: Secondary | ICD-10-CM | POA: Diagnosis not present

## 2015-01-05 ENCOUNTER — Ambulatory Visit (INDEPENDENT_AMBULATORY_CARE_PROVIDER_SITE_OTHER): Payer: Medicare Other | Admitting: *Deleted

## 2015-01-05 DIAGNOSIS — I429 Cardiomyopathy, unspecified: Secondary | ICD-10-CM | POA: Diagnosis not present

## 2015-01-05 NOTE — Progress Notes (Signed)
Remote ICD transmission.   

## 2015-01-08 ENCOUNTER — Encounter: Payer: Self-pay | Admitting: Cardiology

## 2015-01-08 LAB — CUP PACEART REMOTE DEVICE CHECK
Battery Remaining Longevity: 73 mo
Battery Remaining Percentage: 70 %
Battery Voltage: 2.96 V
HIGH POWER IMPEDANCE MEASURED VALUE: 93 Ohm
HighPow Impedance: 93 Ohm
Implantable Lead Model: 181
Implantable Lead Serial Number: 322336
Lead Channel Impedance Value: 600 Ohm
Lead Channel Sensing Intrinsic Amplitude: 12 mV
Lead Channel Setting Pacing Amplitude: 2.5 V
Lead Channel Setting Pacing Pulse Width: 0.5 ms
MDC IDC LEAD IMPLANT DT: 20130729
MDC IDC LEAD LOCATION: 753860
MDC IDC MSMT LEADCHNL RV PACING THRESHOLD AMPLITUDE: 0.5 V
MDC IDC MSMT LEADCHNL RV PACING THRESHOLD PULSEWIDTH: 0.5 ms
MDC IDC SESS DTM: 20161024060014
MDC IDC SET LEADCHNL RV SENSING SENSITIVITY: 0.5 mV
MDC IDC STAT BRADY RV PERCENT PACED: 1 %
Pulse Gen Serial Number: 1022442

## 2015-01-22 ENCOUNTER — Encounter: Payer: Self-pay | Admitting: Cardiology

## 2015-02-24 DIAGNOSIS — E039 Hypothyroidism, unspecified: Secondary | ICD-10-CM | POA: Diagnosis not present

## 2015-02-24 DIAGNOSIS — I10 Essential (primary) hypertension: Secondary | ICD-10-CM | POA: Diagnosis not present

## 2015-02-24 DIAGNOSIS — J449 Chronic obstructive pulmonary disease, unspecified: Secondary | ICD-10-CM | POA: Diagnosis not present

## 2015-02-26 DIAGNOSIS — E559 Vitamin D deficiency, unspecified: Secondary | ICD-10-CM | POA: Diagnosis not present

## 2015-02-26 DIAGNOSIS — E039 Hypothyroidism, unspecified: Secondary | ICD-10-CM | POA: Diagnosis not present

## 2015-03-03 DIAGNOSIS — L821 Other seborrheic keratosis: Secondary | ICD-10-CM | POA: Diagnosis not present

## 2015-03-03 DIAGNOSIS — L304 Erythema intertrigo: Secondary | ICD-10-CM | POA: Diagnosis not present

## 2015-03-03 DIAGNOSIS — L219 Seborrheic dermatitis, unspecified: Secondary | ICD-10-CM | POA: Diagnosis not present

## 2015-03-05 ENCOUNTER — Ambulatory Visit (INDEPENDENT_AMBULATORY_CARE_PROVIDER_SITE_OTHER): Payer: Medicare Other | Admitting: "Endocrinology

## 2015-03-05 ENCOUNTER — Encounter: Payer: Self-pay | Admitting: "Endocrinology

## 2015-03-05 VITALS — BP 110/74 | HR 85 | Ht 67.0 in | Wt 292.0 lb

## 2015-03-05 DIAGNOSIS — I10 Essential (primary) hypertension: Secondary | ICD-10-CM

## 2015-03-05 DIAGNOSIS — E66813 Obesity, class 3: Secondary | ICD-10-CM | POA: Insufficient documentation

## 2015-03-05 DIAGNOSIS — E039 Hypothyroidism, unspecified: Secondary | ICD-10-CM

## 2015-03-05 DIAGNOSIS — E785 Hyperlipidemia, unspecified: Secondary | ICD-10-CM | POA: Diagnosis not present

## 2015-03-05 MED ORDER — LEVOTHYROXINE SODIUM 25 MCG PO TABS: 25.0000 ug | ORAL_TABLET | Freq: Every day | ORAL | Status: DC

## 2015-03-05 MED ORDER — VITAMIN D (ERGOCALCIFEROL) 1.25 MG (50000 UNIT) PO CAPS: 50000.0000 [IU] | ORAL_CAPSULE | ORAL | Status: DC

## 2015-03-05 MED ORDER — LEVOTHYROXINE SODIUM 200 MCG PO TABS: 200.0000 ug | ORAL_TABLET | Freq: Every day | ORAL | Status: DC

## 2015-03-05 NOTE — Patient Instructions (Signed)

## 2015-03-06 NOTE — Progress Notes (Signed)
Subjective:    Patient ID: Melanie Cordova, female    DOB: 1960/07/29, PCP Rosita Fire, MD   Past Medical History  Diagnosis Date  . Hypertension     09/2010-normal CMet and CBC; Lipid profile-116, 88, 25, 73  . Cardiomyopathy 08/2010    Presented with congestive heart failure; EF of 15% and 2012; hypotension on medication precludes optimal dosing  . Gastroesophageal reflux disease   . Pneumonia   . Obesity   . Hyperlipidemia   . Tobacco abuse     20 pack years  . CHF (congestive heart failure) ([place])   . Asthma   . Shortness of breath   . Headache(784.0)   . Arthritis   . Anxiety   . Neuropathy ([place])   . 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 Chronic systolic heart failure ([place])   . Dysrhythmia   . Automatic implantable cardioverter-defibrillator in situ     2013, july  . COPD (chronic obstructive pulmonary disease) ([place])     2013  . Bell palsy     states has had 3 episodes  . Stroke [facility])      stroke in 07/2012 and another in June, 2014  . Seizures ([place])     last one at age 13  . Neuromuscular disorder ([facility])     Neuropathy, right foot and leg  . Hypothyroidism     Recent TSH was normal.   Past Surgical History  Procedure Laterality Date  . Cesarean section      X2  . Tee without cardioversion N/A 07/11/2012    Procedure: TRANSESOPHAGEAL ECHOCARDIOGRAM (TEE);  Surgeon: Thayer Headings, MD;  Location: [facility];  Service: Cardiovascular;  Laterality: N/A;  . Tubal ligation    . Multiple extractions with alveoloplasty N/A 09/24/2012    Procedure: MULTIPLE EXTRACION #2, 4, 6, 7 ,8, 9, 11, 13, 18, 20, 21, 22, 23, 24, 25, 26, 27, 29 WITH ALVEOLOPLASTY, BIOPSY OF PALATE LESION, REMOVA RIGHT LINGUAL TORUS;  Surgeon: Gae Bon, DDS;  Location: [facility];  Service: Oral Surgery;  Laterality: N/A;  . Ep implantable device      St. Jude  . Colonoscopy N/A 12/16/2013    Procedure: COLONOSCOPY;  Surgeon: Danie Binder, MD;  [facility];  Service: Endoscopy;  Laterality: N/A;  10:45-moved to 10/5 @ [place] notified pt  . Implantable cardioverter defibrillator implant N/A 10/10/2011    Procedure: IMPLANTABLE CARDIOVERTER DEFIBRILLATOR IMPLANT;  Surgeon: Evans Lance, MD;  Location: [facility] CATH LAB;  Service: Cardiovascular;  Laterality: N/A;   Social History   Social History  . Marital Status: Divorced    Spouse Name: N/A  . Number of Children: 2  . Years of Education: N/A   Occupational History  .      Works in Environmental consultant   Social History Main Topics  . Smoking status: Current Every Day Smoker -- 0.50 packs/day for 21 years    Types: Cigarettes    Start date: 07/02/1983  . Smokeless tobacco: Never Used     Comment: 5 ciggs daily 11-26-12  . Alcohol Use: No  . Drug Use: No  . Sexual Activity: No   Other Topics Concern  . None   Social History Narrative   Outpatient Encounter Prescriptions as of 03/05/2015  Medication Sig  . albuterol (PROVENTIL HFA;VENTOLIN HFA) 108 (90 BASE) MCG/ACT inhaler Inhale 2 puffs into the lungs every 6 (six) hours as needed for wheezing or  shortness of breath.  Marland Kitchen aspirin EC 81 MG tablet Take 81 mg by mouth daily.  . budesonide-formoterol (SYMBICORT) 80-4.5 MCG/ACT inhaler Inhale 2 puffs into the lungs 2 (two) times daily.  . carvedilol (COREG) 12.5 MG tablet Take 25 mg by mouth 2 (two) times daily with a meal.  . desloratadine (CLARINEX) 5 MG tablet Take 5 mg by mouth daily.  . diclofenac sodium (VOLTAREN) 1 % GEL Apply 2 g topically 4 (four) times daily as needed (pain).   Marland Kitchen diphenhydrAMINE (BENADRYL) 25 mg capsule Take 25 mg by mouth every 8 (eight) hours as needed for allergies.   Marland Kitchen ergocalciferol (VITAMIN D2) 50000 UNITS capsule Take 50,000 Units by mouth once a week.  . Ferrous Sulfate (IRON) 28 MG TABS Take 1 tablet by mouth daily.  . fluticasone (FLONASE) 50 MCG/ACT nasal spray Place into both nostrils daily.  . furosemide (LASIX) 40 MG tablet Take 1 tablet  (40 mg total) by mouth 2 (two) times daily.  Marland Kitchen gabapentin (NEURONTIN) 600 MG tablet Take 600 mg by mouth 3 (three) times daily. Patient takes 1 tablet in the morning and in the afternoon, then takes 2 tablets at bedtime  . levothyroxine (SYNTHROID, LEVOTHROID) 200 MCG tablet Take 1 tablet (200 mcg total) by mouth daily before breakfast. Takes with 31mcg to equal 244mcg  . levothyroxine (SYNTHROID, LEVOTHROID) 25 MCG tablet Take 1 tablet (25 mcg total) by mouth daily before breakfast. Takes with 220mcg to equal 245mcg  . lisinopril (PRINIVIL,ZESTRIL) 10 MG tablet Take 1 tablet (10 mg total) by mouth daily.  Marland Kitchen lovastatin (MEVACOR) 40 MG tablet Take 1 tablet (40 mg total) by mouth at bedtime.  . potassium chloride SA (K-DUR,KLOR-CON) 20 MEQ tablet Take 2 tablets (40 mEq total) by mouth 2 (two) times daily.  . ranitidine (ZANTAC) 150 MG tablet Take 150 mg by mouth at bedtime.   . Vitamin D, Ergocalciferol, (DRISDOL) 50000 UNITS CAPS capsule Take 1 capsule (50,000 Units total) by mouth every 7 (seven) days.  . [DISCONTINUED] levothyroxine (SYNTHROID, LEVOTHROID) 200 MCG tablet Take 200 mcg by mouth daily before breakfast. Takes with 79mcg to equal 269mcg  . [DISCONTINUED] levothyroxine (SYNTHROID, LEVOTHROID) 25 MCG tablet Take 25 mcg by mouth daily before breakfast. Takes with 237mcg to equal 23mcg   No facility-administered encounter medications on file as of 03/05/2015.   ALLERGIES: Allergies  Allergen Reactions  . Fish Allergy Anaphylaxis  . Iodinated Diagnostic Agents Hives and Other (See Comments)    Pulmonary problems; no frank respiratory arrest  . Iodine Hives and Other (See Comments)    Pulmonary Problems   . Lentil Anaphylaxis  . Penicillins Anaphylaxis and Shortness Of Breath    Hair loss  . Lipitor [Atorvastatin] Itching and Rash  . Spironolactone Rash  . Sulfa Antibiotics Other (See Comments)    Unknown   VACCINATION STATUS: Immunization History  Administered Date(s)  Administered  . Pneumococcal Polysaccharide-23 09/28/2010, 11/18/2010    HPI  54 yr old female with medical hx as above. she is here to f/u for hypothyroidism. She decided not to pursue bariatric surgery. Last year, she has had 24 hour urine cortisol to r/o Cushings syndrome which was normal.  She is on LT4 225 mcg po qam. No further weight gain, she is  Not losing either. she denies palpitations, heat intolerance. she denies chest pain, SOB. she has family hx of thyroid dysfunction in her mother. she has AICD . she smokes 1/2 PPD ( 30 PY).  Review of Systems  Constitutional: no weight change, no fatigue, no subjective hyperthermia/hypothermia Eyes: no blurry vision, no xerophthalmia ENT: no sore throat, no nodules palpated in throat, no dysphagia/odynophagia, no hoarseness Cardiovascular: no CP/SOB/palpitations/leg swelling Respiratory: no cough/SOB Gastrointestinal: no N/V/D/C Musculoskeletal: no muscle/joint aches Skin: no rashes Neurological: no tremors/numbness/tingling/dizziness Psychiatric: no depression/anxiety  Objective:    BP 110/74 mmHg  Pulse 85  Ht 5\' 7"  (1.702 m)  Wt 292 lb (132.45 kg)  BMI 45.72 kg/m2  SpO2 96%  Wt Readings from Last 3 Encounters:  03/05/15 292 lb (132.45 kg)  10/06/14 293 lb (132.904 kg)  09/10/14 293 lb 3.2 oz (132.995 kg)    Physical Exam  Constitutional: obese, in NAD Eyes: PERRLA, EOMI, no exophthalmos ENT: moist mucous membranes, no thyromegaly, no cervical lymphadenopathy Cardiovascular: RRR, No MRG Respiratory: CTA B Gastrointestinal: abdomen soft, NT, ND, BS+ Musculoskeletal: no deformities, strength intact in all 4 Skin: moist, warm, no rashes Neurological: no tremor with outstretched hands, DTR normal in all 4  Diabetic Labs (most recent): Lab Results  Component Value Date   HGBA1C 5.7* 04/29/2013   HGBA1C 5.6 07/08/2012   HGBA1C 5.5 11/06/2011     Lipid Panel ( most recent)     Component Value Date/Time    CHOL 240* 04/30/2013 0038   TRIG 158* 04/30/2013 0038   HDL 53 04/30/2013 0038   CHOLHDL 4.5 04/30/2013 0038   VLDL 32 04/30/2013 0038   LDLCALC 155* 04/30/2013 0038   Assessment & Plan:   1. Hypothyroidism, unspecified hypothyroidism type -she has long standing hypothyroidism x 11 yrs. her TFts are now on target on 225 mcg. -we discussed the correct way to take her Lt4 on empty stomach at least 1/2 hour before food or other medications.  2. Morbid obesity due to excess calories ([place]) -she is given detailed carbs and exercise information. She does not have diabetes. Her 24 hour urine free cotisol is normal at 14.5, rules out Cushing's synrome.  She has seen  Jearld Fenton, CDE. Due to her legitimate concern on weight, I discussed bariatric surgery with her and I gave her contact information to [facility]. However, she decided not to pursue that option.  3. Essential hypertension  -I advised her to continue her medications for HTN.  4. Hyperlipidemia - continue Lovastatin 40 mg po qhs.  She is s/p  Treatment with  Vitamin D supplement.  - 25 minutes of time was spent on the care of this patient , 50% of which was applied for counseling on prevention of  Diabetes. - I advised patient to maintain close follow up with [facility], MD for primary care needs. Follow up plan: Return in about 6 months (around 09/03/2015) for underactive thyroid, high blood pressure, vitamin D deficiency, follow up with pre-visit labs.  Glade Lloyd, MD Phone: [phone]  Fax: [phone]   03/06/2015, 11:34 AM

## 2015-03-12 ENCOUNTER — Encounter: Payer: Self-pay | Admitting: Nutrition

## 2015-03-12 ENCOUNTER — Encounter: Payer: Medicare Other | Attending: "Endocrinology | Admitting: Nutrition

## 2015-03-12 DIAGNOSIS — R739 Hyperglycemia, unspecified: Secondary | ICD-10-CM

## 2015-03-12 DIAGNOSIS — I255 Ischemic cardiomyopathy: Secondary | ICD-10-CM

## 2015-03-12 NOTE — Progress Notes (Signed)
  Medical Nutrition Therapy:  Appt start [id number]  end time: 0945.  Assessment:  Primary concerns today: Obesity and prediabetes follow up. Says she has been walking 30 minutes three times per week. Gained 2 lbs. Doesn't  have transportion for [facility] for needed exercise. Can't exercise on her own due to back and leg pain from obesity and chronic health problems.  She has quit smoking. But is now vaping after meals. She feels this is improvement over smoking. Advised of dangers of nictone in vaping. Diet is still excessive in calories and high high in saturated fat as evidenced by 2 lb weight gain and her lack of ability to exercise for long periods of time. Drinking sweet tea. Hasn't had an A1C done. At high risk for DM. PMH: Cardiomyopathy.  Preferred Learning Style:     No preference indicated   Learning Readiness:    Ready  Change in progress   MEDICATIONS: see list   DIETARY INTAKE:  24-hr recall:  B ( AM): Boiled egg, fruit and 1 slice toast.  OR eggs, sausage and bacon and fruit or potatoes.   Snk ( AM): none  L ( PM): Apple, chicken breast, wild rice and peas, water Snk ( PM): none D ( PM): Pasta salad-pasta, onion, peas and chicken breast 1 cup, sweet tea. bverages: water, and unsweet tea  Usual physical activity: walking 30 minutes twice a week .   Estimated energy needs: 1600 calories 180 g carbohydrates 120 g protein 44 g fat  Progress Towards Goal(s):  In progress.   Nutritional Diagnosis:  NB-1.1 Food and nutrition-related knowledge deficit As related to Obesity.  As evidenced by BMI > 40.    Intervention:  Nutrition counseling on healthy weight loss tips, ways to prevent diabetes and importance of exercise. Low sodium, low fat, high fiber diet and meal planning and exercises for needed weight loss. Plan 1. Cut out bacon and sausage  2. Increase exercise 30-45 minutes 3 days per week. 3. Cut out sweet tea and only drink water or unsweetened tea. 4. Cut  out sweets/junk. 5. Lose 1 lb per week. 6. Cut out juice.   Keep up the good work with cutting out processed foods and eating more fresh vegetables.  Keep food journal and bring at next visit. Continue to work on quitting smoking.  Teaching Method Utilized:  Visual Auditory Hands on  Handouts given during visit include: Healthy Weight loss tips Chair exercises  Barriers to learning/adherence to lifestyle change: none  Demonstrated degree of understanding via:  Teach Back   Monitoring/Evaluation:  Dietary intake, exercise, meal planning, and body weight in 3 month(s).  Would recommend to check an A1C due to history of elevated glucose levels in CMET and BMET and her risk factors. May benefit from Metformin.

## 2015-03-12 NOTE — Patient Instructions (Signed)
Plan 1. Cut out bacon and sausage  2. Increase exercise 30-45 minutes 3 days per week. 3. Cut out sweet tea and only drink water or unsweetened tea. 4. Cut out sweets/junk. 5. Lose 1 lb per week. 6. Cut out juice

## 2015-04-02 DIAGNOSIS — M79606 Pain in leg, unspecified: Secondary | ICD-10-CM | POA: Diagnosis not present

## 2015-04-02 DIAGNOSIS — I10 Essential (primary) hypertension: Secondary | ICD-10-CM | POA: Diagnosis not present

## 2015-04-02 DIAGNOSIS — G4459 Other complicated headache syndrome: Secondary | ICD-10-CM | POA: Diagnosis not present

## 2015-04-02 DIAGNOSIS — M5416 Radiculopathy, lumbar region: Secondary | ICD-10-CM | POA: Diagnosis not present

## 2015-04-02 DIAGNOSIS — M1990 Unspecified osteoarthritis, unspecified site: Secondary | ICD-10-CM | POA: Diagnosis not present

## 2015-04-06 ENCOUNTER — Ambulatory Visit (INDEPENDENT_AMBULATORY_CARE_PROVIDER_SITE_OTHER): Payer: Medicare Other | Admitting: *Deleted

## 2015-04-06 DIAGNOSIS — I429 Cardiomyopathy, unspecified: Secondary | ICD-10-CM | POA: Diagnosis not present

## 2015-04-07 NOTE — Progress Notes (Signed)
Remote ICD transmission.   

## 2015-04-09 LAB — CUP PACEART REMOTE DEVICE CHECK
Battery Remaining Longevity: 72 mo
Battery Remaining Percentage: 69 %
Brady Statistic RV Percent Paced: 1 %
Date Time Interrogation Session: 20170123103855
HIGH POWER IMPEDANCE MEASURED VALUE: 89 Ohm
HighPow Impedance: 89 Ohm
Implantable Lead Implant Date: 20130729
Implantable Lead Location: 753860
Implantable Lead Serial Number: 322336
Lead Channel Impedance Value: 600 Ohm
Lead Channel Sensing Intrinsic Amplitude: 12 mV
Lead Channel Setting Pacing Amplitude: 2.5 V
Lead Channel Setting Pacing Pulse Width: 0.5 ms
MDC IDC LEAD MODEL: 181
MDC IDC MSMT BATTERY VOLTAGE: 2.96 V
MDC IDC SET LEADCHNL RV SENSING SENSITIVITY: 0.5 mV
Pulse Gen Serial Number: 1022442

## 2015-04-15 ENCOUNTER — Encounter: Payer: Self-pay | Admitting: Cardiology

## 2015-04-29 ENCOUNTER — Encounter: Payer: Self-pay | Admitting: Cardiology

## 2015-05-28 ENCOUNTER — Encounter: Payer: Self-pay | Admitting: Nutrition

## 2015-05-28 ENCOUNTER — Encounter: Payer: Medicare Other | Attending: "Endocrinology | Admitting: Nutrition

## 2015-05-28 DIAGNOSIS — R739 Hyperglycemia, unspecified: Secondary | ICD-10-CM

## 2015-05-28 DIAGNOSIS — E119 Type 2 diabetes mellitus without complications: Secondary | ICD-10-CM | POA: Diagnosis not present

## 2015-05-28 NOTE — Patient Instructions (Signed)
Plan 1. Cut out bacon and sausage  2. Increase exercise 30-45 minutes 3 days per week. 3. Cut out sweet tea and only drink water or unsweetened tea. 4. Cut out sweets/junk. 5. Lose 1 lb per week. 6. Cut out juice.

## 2015-05-28 NOTE — Progress Notes (Signed)
  Medical Nutrition Therapy:  Appt start [id number]  end time: 0945.  Assessment:  Primary concerns today: Obesity and prediabetes follow up. No weight loss.  Weight same as last visit. No recent A1C. She is working on exercising and eating better but limited with mobility. Says she and her aide walk sometimes. Has cut back on smoking. Her sister has moved out of her house which was a stressor for her.  Diet is low in fresh fruits, vegetables. Needs increased exercise and reduced calories for needed weight loss.  Preferred Learning Style:     No preference indicated   Learning Readiness:    Ready  Change in progress   MEDICATIONS: see list   DIETARY INTAKE:  24-hr recall:  B ( AM): B boiled egg, toast with perserves and 2 slices bacon or sausage, 4 oz fruit  Snk ( AM): none  L ( PM): brown rice, spinach and pork chop and water Snk ( PM): none D ( PM): same as lunch. bverages: water, and unsweet tea  Usual physical activity: walking 30 minutes twice a week .   Estimated energy needs: 1600 calories 180 g carbohydrates 120 g protein 44 g fat  Progress Towards Goal(s):  In progress.   Nutritional Diagnosis:  NB-1.1 Food and nutrition-related knowledge deficit As related to Obesity.  As evidenced by BMI > 40.    Intervention:  Nutrition counseling on healthy weight loss tips, ways to prevent diabetes and importance of exercise. Low sodium, low fat, high fiber diet and meal planning and exercises for needed weight loss. Plan 1. Cut out bacon and sausage  2. Increase exercise 30-45 minutes 3 days per week. 3. Cut out sweet tea and only drink water or unsweetened tea. 4. Cut out sweets/junk. 5. Lose 1 lb per week. 6. Cut out juice.  Keep food journal and bring at next visit. Continue to work on quitting smoking.  Teaching Method Utilized:  Visual Auditory Hands on  Handouts given during visit include: Healthy Weight loss tips Chair exercises  Barriers to  learning/adherence to lifestyle change: none  Demonstrated degree of understanding via:  Teach Back   Monitoring/Evaluation:  Dietary intake, exercise, meal planning, and body weight in 3 month(s).  Consider checking an A1C.

## 2015-06-02 DIAGNOSIS — I10 Essential (primary) hypertension: Secondary | ICD-10-CM | POA: Diagnosis not present

## 2015-06-02 DIAGNOSIS — J449 Chronic obstructive pulmonary disease, unspecified: Secondary | ICD-10-CM | POA: Diagnosis not present

## 2015-06-02 DIAGNOSIS — F17200 Nicotine dependence, unspecified, uncomplicated: Secondary | ICD-10-CM | POA: Diagnosis not present

## 2015-06-02 DIAGNOSIS — I499 Cardiac arrhythmia, unspecified: Secondary | ICD-10-CM | POA: Diagnosis not present

## 2015-06-02 DIAGNOSIS — E039 Hypothyroidism, unspecified: Secondary | ICD-10-CM | POA: Diagnosis not present

## 2015-07-08 ENCOUNTER — Encounter: Payer: Medicare Other | Attending: "Endocrinology | Admitting: Nutrition

## 2015-07-08 DIAGNOSIS — E119 Type 2 diabetes mellitus without complications: Secondary | ICD-10-CM | POA: Insufficient documentation

## 2015-07-08 DIAGNOSIS — R739 Hyperglycemia, unspecified: Secondary | ICD-10-CM

## 2015-07-08 NOTE — Patient Instructions (Addendum)
Plan 1. Follow Plate Method 2. Increase exercise 30-45 minutes 3 days per week. 3. Cut out hard candies 4. Continue to increase fresh fruits and whole grains and vegetables. 5. Lose 1 lb per week. 6. Keep up the good job!!

## 2015-07-08 NOTE — Progress Notes (Signed)
  Medical Nutrition Therapy:  Appt start time: 1130  end time: 1200  Assessment:  Primary concerns today: Obesity and prediabetes follow up.  Has lost 5 lbs. Has been walking- 30 minutes twice a week. Getting rid  of the tea and drinking only water now. 7 lbs weight loss since last visit.  She states she is walking with her aide 2-3 times per week at the [place]. Trying to cut out junk food and sweets and reducing portions. No A1C.  Eating much better balanced meals.    Preferred Learning Style:     No preference indicated   Learning Readiness:    Ready  Change in progress   MEDICATIONS: see list   DIETARY INTAKE:  24-hr recall:  B ( AM): Bottled water, 1 piece of ww toast, strawberries and boiled egg and oatmeal.  B Snk ( AM): none  L ( PM): Cheese burger with lettuce, tomato and onion and spinach salad and water Snk ( PM): none D ( PM): Chicken breast, green beans, red potatoes with water bverages: water,   Usual physical activity: walking 30 minutes twice a week .   Estimated energy needs: 1600 calories 180 g carbohydrates 120 g protein 44 g fat  Progress Towards Goal(s):  In progress.   Nutritional Diagnosis:  NB-1.1 Food and nutrition-related knowledge deficit As related to Obesity.  As evidenced by BMI > 40.    Intervention:  Nutrition counseling on healthy weight loss tips, ways to prevent diabetes and importance of exercise. Low sodium, low fat, high fiber diet and meal planning and exercises for needed weight loss. Plan 1. Follow Plate Method 2. Increase exercise 30-45 minutes 3 days per week. 3. Cut out hard candies 4. Continue to increase fresh fruits and whole grains and vegetables. 5. Lose 1 lb per week. 6. Keep up the good job!!   Keep food journal and bring at next visit. Continue to work on quitting smoking.  Teaching Method Utilized:  Visual Auditory Hands on  Handouts given during visit include: Healthy Weight loss tips Chair  exercises  Barriers to learning/adherence to lifestyle change: none  Demonstrated degree of understanding via:  Teach Back   Monitoring/Evaluation:  Dietary intake, exercise, meal planning, and body weight in 3 month(s).  Consider checking an A1C and lipid profile.

## 2015-07-10 ENCOUNTER — Encounter: Payer: Self-pay | Admitting: Nutrition

## 2015-07-13 ENCOUNTER — Other Ambulatory Visit: Payer: Self-pay

## 2015-07-13 MED ORDER — LEVOTHYROXINE SODIUM 25 MCG PO TABS: 25.0000 ug | ORAL_TABLET | Freq: Every day | ORAL | Status: DC

## 2015-07-25 ENCOUNTER — Observation Stay (HOSPITAL_COMMUNITY): Payer: Medicare Other

## 2015-07-25 ENCOUNTER — Encounter (HOSPITAL_COMMUNITY): Payer: Self-pay | Admitting: Cardiology

## 2015-07-25 ENCOUNTER — Observation Stay (HOSPITAL_COMMUNITY)
Admission: EM | Admit: 2015-07-25 | Discharge: 2015-07-28 | Disposition: A | Discharge status: Home / Self Care | Payer: Medicare Other | Attending: Internal Medicine | Admitting: Internal Medicine

## 2015-07-25 ENCOUNTER — Emergency Department (HOSPITAL_COMMUNITY): Payer: Medicare Other

## 2015-07-25 DIAGNOSIS — I639 Cerebral infarction, unspecified: Secondary | ICD-10-CM | POA: Diagnosis not present

## 2015-07-25 DIAGNOSIS — I651 Occlusion and stenosis of basilar artery: Secondary | ICD-10-CM

## 2015-07-25 DIAGNOSIS — F17210 Nicotine dependence, cigarettes, uncomplicated: Secondary | ICD-10-CM | POA: Diagnosis not present

## 2015-07-25 DIAGNOSIS — J45909 Unspecified asthma, uncomplicated: Secondary | ICD-10-CM | POA: Diagnosis not present

## 2015-07-25 DIAGNOSIS — Z7982 Long term (current) use of aspirin: Secondary | ICD-10-CM | POA: Insufficient documentation

## 2015-07-25 DIAGNOSIS — Z79899 Other long term (current) drug therapy: Secondary | ICD-10-CM | POA: Diagnosis not present

## 2015-07-25 DIAGNOSIS — J449 Chronic obstructive pulmonary disease, unspecified: Secondary | ICD-10-CM | POA: Diagnosis not present

## 2015-07-25 DIAGNOSIS — Z720 Tobacco use: Secondary | ICD-10-CM

## 2015-07-25 DIAGNOSIS — I6789 Other cerebrovascular disease: Secondary | ICD-10-CM | POA: Diagnosis not present

## 2015-07-25 DIAGNOSIS — E669 Obesity, unspecified: Secondary | ICD-10-CM | POA: Diagnosis not present

## 2015-07-25 DIAGNOSIS — E039 Hypothyroidism, unspecified: Secondary | ICD-10-CM | POA: Insufficient documentation

## 2015-07-25 DIAGNOSIS — I5022 Chronic systolic (congestive) heart failure: Secondary | ICD-10-CM | POA: Insufficient documentation

## 2015-07-25 DIAGNOSIS — M1990 Unspecified osteoarthritis, unspecified site: Secondary | ICD-10-CM | POA: Diagnosis not present

## 2015-07-25 DIAGNOSIS — E785 Hyperlipidemia, unspecified: Secondary | ICD-10-CM | POA: Insufficient documentation

## 2015-07-25 DIAGNOSIS — R29810 Facial weakness: Secondary | ICD-10-CM | POA: Diagnosis not present

## 2015-07-25 DIAGNOSIS — I429 Cardiomyopathy, unspecified: Secondary | ICD-10-CM

## 2015-07-25 DIAGNOSIS — I110 Hypertensive heart disease with heart failure: Secondary | ICD-10-CM | POA: Diagnosis not present

## 2015-07-25 DIAGNOSIS — R4781 Slurred speech: Secondary | ICD-10-CM | POA: Diagnosis not present

## 2015-07-25 DIAGNOSIS — Z95810 Presence of automatic (implantable) cardiac defibrillator: Secondary | ICD-10-CM | POA: Diagnosis not present

## 2015-07-25 DIAGNOSIS — I10 Essential (primary) hypertension: Secondary | ICD-10-CM | POA: Diagnosis not present

## 2015-07-25 LAB — COMPREHENSIVE METABOLIC PANEL
ALK PHOS: 81 U/L (ref 38–126)
ALT: 25 U/L (ref 14–54)
ANION GAP: 6 (ref 5–15)
AST: 25 U/L (ref 15–41)
Albumin: 3.8 g/dL (ref 3.5–5.0)
BUN: 8 mg/dL (ref 6–20)
CALCIUM: 8.5 mg/dL — AB (ref 8.9–10.3)
CHLORIDE: 104 mmol/L (ref 101–111)
CO2: 26 mmol/L (ref 22–32)
Creatinine, Ser: 0.69 mg/dL (ref 0.44–1.00)
GFR calc Af Amer: >60 mL/min (ref >60)
GFR calc non Af Amer: >60 mL/min (ref >60)
Glucose, Bld: 114 mg/dL — ABNORMAL HIGH (ref 65–99)
Potassium: 4 mmol/L (ref 3.5–5.1)
Sodium: 136 mmol/L (ref 135–145)
Total Bilirubin: 0.4 mg/dL (ref 0.3–1.2)
Total Protein: 6.9 g/dL (ref 6.5–8.1)

## 2015-07-25 LAB — I-STAT CHEM 8, ED
BUN: 7 mg/dL (ref 6–20)
Calcium, Ion: 1.02 mmol/L — ABNORMAL LOW (ref 1.12–1.23)
Chloride: 101 mmol/L (ref 101–111)
Creatinine, Ser: 0.7 mg/dL (ref 0.44–1.00)
Glucose, Bld: 112 mg/dL — ABNORMAL HIGH (ref 65–99)
HCT: 46 % (ref 36.0–46.0)
HEMOGLOBIN: 15.6 g/dL — AB (ref 12.0–15.0)
Potassium: 4.1 mmol/L (ref 3.5–5.1)
SODIUM: 140 mmol/L (ref 135–145)
TCO2: 26 mmol/L (ref 0–100)

## 2015-07-25 LAB — I-STAT TROPONIN, ED: TROPONIN I, POC: 0 ng/mL (ref 0.00–0.08)

## 2015-07-25 LAB — CBC
HCT: 44.2 % (ref 36.0–46.0)
Hemoglobin: 14.1 g/dL (ref 12.0–15.0)
MCH: 28.9 pg (ref 26.0–34.0)
MCHC: 31.9 g/dL (ref 30.0–36.0)
MCV: 90.6 fL (ref 78.0–100.0)
Platelets: 256 10*3/uL (ref 150–400)
RBC: 4.88 MIL/uL (ref 3.87–5.11)
RDW: 13.9 % (ref 11.5–15.5)
WBC: 8.9 10*3/uL (ref 4.0–10.5)

## 2015-07-25 LAB — DIFFERENTIAL
BASOS PCT: 1 %
Basophils Absolute: 0.1 10*3/uL (ref 0.0–0.1)
EOS PCT: 4 %
Eosinophils Absolute: 0.4 10*3/uL (ref 0.0–0.7)
Lymphocytes Relative: 27 %
Lymphs Abs: 2.4 10*3/uL (ref 0.7–4.0)
MONO ABS: 0.6 10*3/uL (ref 0.1–1.0)
Monocytes Relative: 6 %
Neutro Abs: 5.5 10*3/uL (ref 1.7–7.7)
Neutrophils Relative %: 62 %

## 2015-07-25 LAB — ETHANOL

## 2015-07-25 LAB — APTT: aPTT: 26 seconds (ref 24–37)

## 2015-07-25 LAB — PROTIME-INR
INR: 0.98 (ref 0.00–1.49)
PROTHROMBIN TIME: 13.2 s (ref 11.6–15.2)

## 2015-07-25 MED ORDER — GABAPENTIN 600 MG PO TABS: 600.0000 mg | ORAL_TABLET | Freq: Three times a day (TID) | ORAL | Status: DC

## 2015-07-25 MED ORDER — ASPIRIN 325 MG PO TABS
325.0000 mg | ORAL_TABLET | Freq: Once | ORAL | Status: AC
  Administered 2015-07-25: 325 mg via ORAL
  Filled 2015-07-25: qty 1

## 2015-07-25 MED ORDER — ENOXAPARIN SODIUM 40 MG/0.4ML ~~LOC~~ SOLN
40.0000 mg | SUBCUTANEOUS | Status: DC
  Administered 2015-07-25 – 2015-07-27 (×3): 40 mg via SUBCUTANEOUS
  Filled 2015-07-25 (×3): qty 0.4

## 2015-07-25 MED ORDER — FAMOTIDINE 20 MG PO TABS
20.0000 mg | ORAL_TABLET | Freq: Every day | ORAL | Status: DC
  Administered 2015-07-25 – 2015-07-27 (×3): 20 mg via ORAL
  Filled 2015-07-25 (×3): qty 1

## 2015-07-25 MED ORDER — ALBUTEROL SULFATE (2.5 MG/3ML) 0.083% IN NEBU: 3.0000 mL | INHALATION_SOLUTION | Freq: Four times a day (QID) | RESPIRATORY_TRACT | Status: DC | PRN

## 2015-07-25 MED ORDER — FUROSEMIDE 40 MG PO TABS
40.0000 mg | ORAL_TABLET | Freq: Two times a day (BID) | ORAL | Status: DC
  Administered 2015-07-25 – 2015-07-28 (×5): 40 mg via ORAL
  Filled 2015-07-25 (×6): qty 1

## 2015-07-25 MED ORDER — POTASSIUM CHLORIDE CRYS ER 20 MEQ PO TBCR
40.0000 meq | EXTENDED_RELEASE_TABLET | Freq: Two times a day (BID) | ORAL | Status: DC
  Administered 2015-07-25 – 2015-07-28 (×6): 40 meq via ORAL
  Filled 2015-07-25 (×6): qty 2

## 2015-07-25 MED ORDER — GABAPENTIN 400 MG PO CAPS
1200.0000 mg | ORAL_CAPSULE | Freq: Every day | ORAL | Status: DC
  Administered 2015-07-25 – 2015-07-27 (×3): 1200 mg via ORAL
  Filled 2015-07-25 (×3): qty 3

## 2015-07-25 MED ORDER — LEVOTHYROXINE SODIUM 100 MCG PO TABS
200.0000 ug | ORAL_TABLET | Freq: Every day | ORAL | Status: DC
  Administered 2015-07-26 – 2015-07-28 (×3): 200 ug via ORAL
  Filled 2015-07-25 (×3): qty 2

## 2015-07-25 MED ORDER — GABAPENTIN 300 MG PO CAPS
600.0000 mg | ORAL_CAPSULE | Freq: Two times a day (BID) | ORAL | Status: DC
  Administered 2015-07-26 – 2015-07-28 (×5): 600 mg via ORAL
  Filled 2015-07-25 (×5): qty 2

## 2015-07-25 MED ORDER — CARVEDILOL 12.5 MG PO TABS
25.0000 mg | ORAL_TABLET | Freq: Two times a day (BID) | ORAL | Status: DC
  Administered 2015-07-25 – 2015-07-28 (×6): 25 mg via ORAL
  Filled 2015-07-25 (×6): qty 2

## 2015-07-25 MED ORDER — DIPHENHYDRAMINE HCL 25 MG PO CAPS: 25.0000 mg | ORAL_CAPSULE | Freq: Three times a day (TID) | ORAL | Status: DC | PRN

## 2015-07-25 MED ORDER — LEVOTHYROXINE SODIUM 25 MCG PO TABS
25.0000 ug | ORAL_TABLET | Freq: Every day | ORAL | Status: DC
  Administered 2015-07-26 – 2015-07-28 (×3): 25 ug via ORAL
  Filled 2015-07-25 (×3): qty 1

## 2015-07-25 MED ORDER — MOMETASONE FURO-FORMOTEROL FUM 100-5 MCG/ACT IN AERO
2.0000 | INHALATION_SPRAY | Freq: Two times a day (BID) | RESPIRATORY_TRACT | Status: DC
  Administered 2015-07-26 – 2015-07-28 (×5): 2 via RESPIRATORY_TRACT
  Filled 2015-07-25: qty 8.8

## 2015-07-25 MED ORDER — ASPIRIN EC 81 MG PO TBEC
81.0000 mg | DELAYED_RELEASE_TABLET | Freq: Every day | ORAL | Status: DC
  Administered 2015-07-26 – 2015-07-28 (×3): 81 mg via ORAL
  Filled 2015-07-25 (×3): qty 1

## 2015-07-25 NOTE — ED Provider Notes (Signed)
CSN: [id number]     Arrival date & time 07/25/15  1229 History   First MD Initiated Contact with Patient 07/25/15 1230     Chief Complaint  Patient presents with  . Code Stroke  PT HAD A SUDDEN ONSET OF LEFT SIDED FACIAL DROOP WITH LEFT ARM AND LEG WEAKNESS AT [place].   (Consider location/radiation/quality/duration/timing/severity/associated sxs/prior Treatment) The history is provided by the patient and the EMS personnel.    Past Medical History  Diagnosis Date  . Hypertension     09/2010-normal CMet and CBC; Lipid profile-116, 88, 25, 73  . Cardiomyopathy 08/2010    Presented with congestive heart failure; EF of 15% and 2012; hypotension on medication precludes optimal dosing  . Gastroesophageal reflux disease   . Pneumonia   . Obesity   . Hyperlipidemia   . Tobacco abuse     20 pack years  . CHF (congestive heart failure) ([place])   . Asthma   . Shortness of breath   . Headache(784.0)   . Arthritis   . Anxiety   . Neuropathy ([place])   . 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 Chronic systolic heart failure ([place])   . Dysrhythmia   . Automatic implantable cardioverter-defibrillator in situ     2013, july  . COPD (chronic obstructive pulmonary disease) ([place])     2013  . Bell palsy     states has had 3 episodes  . Stroke [facility])      stroke in 07/2012 and another in June, 2014  . Seizures ([place])     last one at age 5  . Neuromuscular disorder ([facility])     Neuropathy, right foot and leg  . Hypothyroidism     Recent TSH was normal.   Past Surgical History  Procedure Laterality Date  . Cesarean section      X2  . Tee without cardioversion N/A 07/11/2012    Procedure: TRANSESOPHAGEAL ECHOCARDIOGRAM (TEE);  Surgeon: Thayer Headings, MD;  Location: [place];  Service: Cardiovascular;  Laterality: N/A;  . Tubal ligation    . Multiple extractions with alveoloplasty N/A 09/24/2012    Procedure: MULTIPLE EXTRACION #2, 4, 6, 7 ,8, 9, 11, 13, 18, 20, 21,  22, 23, 24, 25, 26, 27, 29 WITH ALVEOLOPLASTY, BIOPSY OF PALATE LESION, REMOVA RIGHT LINGUAL TORUS;  Surgeon: Gae Bon, DDS;  Location: [place];  Service: Oral Surgery;  Laterality: N/A;  . Ep implantable device      St. Jude  . Colonoscopy N/A 12/16/2013    Procedure: COLONOSCOPY;  Surgeon: Danie Binder, MD;  [facility];  Service: Endoscopy;  Laterality: N/A;  10:45-moved to 10/5 @ [place] notified pt  . Implantable cardioverter defibrillator implant N/A 10/10/2011    Procedure: IMPLANTABLE CARDIOVERTER DEFIBRILLATOR IMPLANT;  Surgeon: Evans Lance, MD;  Location: [facility] CATH LAB;  Service: Cardiovascular;  Laterality: N/A;   Family History  Problem Relation Age of Onset  . Cardiomyopathy Mother     ICD pacemaker-ischmic CM  . Heart failure Mother   . Hypertension Mother   . Diabetes Mother   . Thyroid disease Mother   . Cardiomyopathy Father     Deceased  . Coronary artery disease Father   . Heart failure Father   . Heart failure Brother   . Hypertension Brother   . Endometriosis Sister   . Colon cancer Neg Hx   . Liver disease Neg Hx    Social History  Substance  Use Topics  . Smoking status: Current Every Day Smoker -- 0.50 packs/day for 21 years    Types: Cigarettes    Start date: 07/02/1983  . Smokeless tobacco: Never Used     Comment: 5 ciggs daily 11-26-12  . Alcohol Use: No   OB History    No data available     Review of Systems  Neurological: Positive for facial asymmetry, speech difficulty, weakness and numbness.  All other systems reviewed and are negative.     Allergies  Fish allergy; Iodinated diagnostic agents; Iodine; Lentil; Penicillins; Lipitor; Spironolactone; and Sulfa antibiotics  Home Medications   Prior to Admission medications   Medication Sig Start Date End Date Taking? Authorizing Provider  albuterol (PROVENTIL HFA;VENTOLIN HFA) 108 (90 BASE) MCG/ACT inhaler Inhale 2 puffs into the lungs every 6 (six) hours as needed  for wheezing or shortness of breath. 07/12/12   Trinda Pascal, MD  aspirin EC 81 MG tablet Take 81 mg by mouth daily.    Historical Provider, MD  budesonide-formoterol (SYMBICORT) 80-4.5 MCG/ACT inhaler Inhale 2 puffs into the lungs 2 (two) times daily.    Historical Provider, MD  carvedilol (COREG) 12.5 MG tablet Take 25 mg by mouth 2 (two) times daily with a meal.    Historical Provider, MD  desloratadine (CLARINEX) 5 MG tablet Take 5 mg by mouth daily.    Historical Provider, MD  diclofenac sodium (VOLTAREN) 1 % GEL Apply 2 g topically 4 (four) times daily as needed (pain).     Historical Provider, MD  diphenhydrAMINE (BENADRYL) 25 mg capsule Take 25 mg by mouth every 8 (eight) hours as needed for allergies.     Historical Provider, MD  ergocalciferol (VITAMIN D2) 50000 UNITS capsule Take 50,000 Units by mouth once a week.    Historical Provider, MD  Ferrous Sulfate (IRON) 28 MG TABS Take 1 tablet by mouth daily.    Historical Provider, MD  fluticasone (FLONASE) 50 MCG/ACT nasal spray Place into both nostrils daily.    Historical Provider, MD  furosemide (LASIX) 40 MG tablet Take 1 tablet (40 mg total) by mouth 2 (two) times daily. 08/29/12   Yehuda Savannah, MD  gabapentin (NEURONTIN) 600 MG tablet Take 600 mg by mouth 3 (three) times daily. Patient takes 1 tablet in the morning and in the afternoon, then takes 2 tablets at bedtime    Historical Provider, MD  levothyroxine (SYNTHROID, LEVOTHROID) 25 MCG tablet Take 1 tablet (25 mcg total) by mouth daily before breakfast. Takes with 223mcg to equal 214mcg 07/13/15   [name], MD  lisinopril (PRINIVIL,ZESTRIL) 10 MG tablet Take 1 tablet (10 mg total) by mouth daily. 08/29/12   Yehuda Savannah, MD  lovastatin (MEVACOR) 40 MG tablet Take 1 tablet (40 mg total) by mouth at bedtime. 08/29/12   Yehuda Savannah, MD  potassium chloride SA (K-DUR,KLOR-CON) 20 MEQ tablet Take 2 tablets (40 mEq total) by mouth 2 (two) times daily. 08/29/12    Yehuda Savannah, MD  ranitidine (ZANTAC) 150 MG tablet Take 150 mg by mouth at bedtime.     Historical Provider, MD  Vitamin D, Ergocalciferol, (DRISDOL) 50000 UNITS CAPS capsule Take 1 capsule (50,000 Units total) by mouth every 7 (seven) days. 03/05/15   [name], MD   BP 108/65 mmHg  Pulse 89  Temp(Src) 97.8 F (36.6 C) (Oral)  Resp 19  Ht 5' 7.5" (1.715 m)  Wt 287 lb (130.182 kg)  BMI 44.26 kg/m2  SpO2  94% Physical Exam  Constitutional: She is oriented to person, place, and time. She appears well-developed and well-nourished.  HENT:  Head: Normocephalic and atraumatic.  Right Ear: External ear normal.  Left Ear: External ear normal.  Mouth/Throat: Oropharynx is clear and moist.  Eyes: Conjunctivae are normal. Pupils are equal, round, and reactive to light.  Neck: Normal range of motion. Neck supple.  Cardiovascular: Normal rate, regular rhythm, normal heart sounds and intact distal pulses.   Pulmonary/Chest: Effort normal and breath sounds normal.  Abdominal: Soft. Bowel sounds are normal.  Musculoskeletal: Normal range of motion.  Neurological: She is alert and oriented to person, place, and time. GCS eye subscore is 4. GCS verbal subscore is 5. GCS motor subscore is 6.  LEFT SIDED FACIAL DROOP.  LEFT ARM AND LEG WEAKNESS.  Skin: Skin is warm and dry.  Psychiatric: She has a normal mood and affect. Her behavior is normal. Judgment and thought content normal.  Nursing note and vitals reviewed.   ED Course  Procedures (including critical care time) Labs Review Labs Reviewed  COMPREHENSIVE METABOLIC PANEL - Abnormal; Notable for the following:    Glucose, Bld 114 (*)    Calcium 8.5 (*)    All other components within normal limits  I-STAT CHEM 8, ED - Abnormal; Notable for the following:    Glucose, Bld 112 (*)    Calcium, Ion 1.02 (*)    Hemoglobin 15.6 (*)    All other components within normal limits  ETHANOL  PROTIME-INR  APTT  CBC  DIFFERENTIAL   URINE RAPID DRUG SCREEN, HOSP PERFORMED  URINALYSIS, ROUTINE W REFLEX MICROSCOPIC (NOT AT [facility])  I-STAT TROPOININ, ED    Imaging Review Ct Head Wo Contrast  07/25/2015  CLINICAL DATA:  Left-sided facial droop and left-sided weakness with slurred speech since noon time. EXAM: CT HEAD WITHOUT CONTRAST TECHNIQUE: Contiguous axial images were obtained from the base of the skull through the vertex without intravenous contrast. COMPARISON:  05/21/2014 and 05/08/2013 FINDINGS: Ventricles, cisterns and other CSF spaces are within normal. There is no mass, mass effect, shift of midline structures or acute hemorrhage. There is no evidence of acute infarction. Remaining bones soft tissues are within normal. IMPRESSION: No acute intracranial findings. These results were called by telephone at the time of interpretation on 07/25/2015 at 12:53 pm to Dr. Lacinda Axon, who verbally acknowledged these results. Electronically Signed   By: Marin Olp M.D.   On: 07/25/2015 12:53   I have personally reviewed and evaluated these images and lab results as part of my medical decision-making.   EKG Interpretation   Date/Time:  Saturday Jul 25 2015 12:34:09 EDT Ventricular Rate:  86 PR Interval:  145 QRS Duration: 128 QT Interval:  372 QTC Calculation: 445 R Axis:   -36 Text Interpretation:  Sinus rhythm Nonspecific IVCD with LAD Left  ventricular hypertrophy Borderline T abnormalities, diffuse leads Baseline  wander in lead(s) V6 Confirmed by Cleland Simkins MD, Beverley Allender ([id number]) on 07/25/2015  12:42:07 PM      MDM  PT D/W TELE NEUROLOGIST AND HE DOES NOT FEEL LIKE SHE NEEDS TPA.  I SPOKE WITH DR. Sarajane Jews (HOSPITALIST) WHO WILL ADMIT HER FOR OBS.  HE WANTED TO GET A MRI, BUT UNFORTUNATELY, PT HAS AN AICD THAT WAS PLACED IN 2013.  Final diagnoses:  Cerebral infarction due to unspecified mechanism  Tobacco abuse       Isla Pence, MD 07/25/15 1750

## 2015-07-25 NOTE — Progress Notes (Signed)
1225 call that code stroke is coming in 1226 beeper code stroke 2 minutes out 1240 patient in CT room with RN from [place] CT scan began 1245 CT scan ended/ Images sent to SOC/ patient and RN back to ER 1246 Exam completed in EPIC/ called Stacy at [place] called EDP and exam finalized in EPIC

## 2015-07-25 NOTE — ED Notes (Signed)
Left sided facial droop, left sided weakness and slurred speech.  Sudden onset around noon today.

## 2015-07-25 NOTE — Progress Notes (Signed)
1950 Patient assisted to the bathroom ambulatory w/o difficulty. Able to walk mostly steady with a little favoring to the RIGHT leg & RIGHT foot d/t c/o tingling, Hx of neuropathty. Patient c/o slight tingling to LEFT facial cheek region, Hx of bell palsy. Smile is symmetrical at this time. Neuro checks WDL at this time. No new symptoms per patient at this time.

## 2015-07-25 NOTE — H&P (Addendum)
History and Physical  [name] [id number] DOB: March 01, 1961 DOA: 07/25/2015  PCP: Rosita Fire, MD  Patient coming from: home  Chief Complaint: face weakness  HPI:  55 year old woman with history of cardiomyopathy, hyperlipidemia, hypertension, ongoing smoking use, cardiomyopathy status post implantable cardiac defibrillator, remote seizure disorder, multiple episodes of left-sided neurologic symptoms presented with an episode of facial weakness and dysarthria. Code stroke was activated, she was evaluated in the emergency department by teleneurology who noted some left-sided weakness but recommended against TPA because of mild symptoms per EDP report. The patient was given aspirin and referred for observation.  Reports that today she was watching TV at about noon when she had a brief episode where his fingers on her right hand developed spasm, then she had a transient episode of left-sided facial weakness, "drawing", slurred speech. She denies any left-sided arm or leg weakness and denies paresthesias. No difficulty walking. Symptoms lasted only for very short period of time. When asked if she is under any stress, she details significant stressors including having to exclude her sister from her home as well as problems she's had with her daughter in the last 2 weeks.  Currently she feels well and denies a neurologic complaints.  In the review of the record, patient had multiple instances of left-sided weakness in the last 3 years, she was evaluated by neurology multiple times. CT without acute events, not a candidate for MRI secondary to implanted defibrillator. Migraine/headache considered in the past.  Last echocardiogram on file 2015 showed LVEF 30-35 percent with grade 1 diastolic dysfunction. Carotid Dopplers 2015 with minimal stenosis bilaterally.  ED Course: as above Pertinent labs: Complete metabolic panel unremarkable, troponin negative, CBC unremarkable EKG: Independently  reviewed. Sinus rhythm, no acute changes Imaging: CT head no acute findings. Chest x-ray no acute disease.  Review of Systems:  Negative for fever, visual changes, sore throat, rash, new muscle aches, chest pain, SOB, dysuria, bleeding, n/v/abdominal pain.  Past Medical History  Diagnosis Date  . Hypertension     09/2010-normal CMet and CBC; Lipid profile-116, 88, 25, 73  . Cardiomyopathy 08/2010    Presented with congestive heart failure; EF of 15% and 2012; hypotension on medication precludes optimal dosing  . Gastroesophageal reflux disease   . Pneumonia   . Obesity   . Hyperlipidemia   . Tobacco abuse     20 pack years  . CHF (congestive heart failure) ([place])   . Asthma   . Shortness of breath   . Headache(784.0)   . Arthritis   . Anxiety   . Neuropathy ([place])   . 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 Chronic systolic heart failure ([place])   . Dysrhythmia   . Automatic implantable cardioverter-defibrillator in situ     2013, july  . COPD (chronic obstructive pulmonary disease) ([place])     2013  . Bell palsy     states has had 3 episodes  . Stroke [facility])      stroke in 07/2012 and another in June, 2014  . Seizures ([place])     last one at age 64  . Neuromuscular disorder (HCC)     Neuropathy, right foot and leg  . Hypothyroidism     Recent TSH was normal.    Past Surgical History  Procedure Laterality Date  . Cesarean section      X2  . Tee without cardioversion N/A 07/11/2012    Procedure: TRANSESOPHAGEAL ECHOCARDIOGRAM (TEE);  Surgeon: Thayer Headings, MD;  Location:  [facility];  Service: Cardiovascular;  Laterality: N/A;  . Tubal ligation    . Multiple extractions with alveoloplasty N/A 09/24/2012    Procedure: MULTIPLE EXTRACION #2, 4, 6, 7 ,8, 9, 11, 13, 18, 20, 21, 22, 23, 24, 25, 26, 27, 29 WITH ALVEOLOPLASTY, BIOPSY OF PALATE LESION, REMOVA RIGHT LINGUAL TORUS;  Surgeon: Gae Bon, DDS;  Location: [place];  Service: Oral Surgery;   Laterality: N/A;  . Ep implantable device      St. Jude  . Colonoscopy N/A 12/16/2013    Procedure: COLONOSCOPY;  Surgeon: Danie Binder, MD;  [facility];  Service: Endoscopy;  Laterality: N/A;  10:45-moved to 10/5 @ [place] notified pt  . Implantable cardioverter defibrillator implant N/A 10/10/2011    Procedure: IMPLANTABLE CARDIOVERTER DEFIBRILLATOR IMPLANT;  Surgeon: Evans Lance, MD;  Location: [facility] CATH LAB;  Service: Cardiovascular;  Laterality: N/A;     reports that she has been smoking Cigarettes.  She started smoking about 32 years ago. She has a 10.5 pack-year smoking history. She has never used smokeless tobacco. She reports that she does not drink alcohol or use illicit drugs.  Allergies  Allergen Reactions  . Fish Allergy Anaphylaxis  . Iodinated Diagnostic Agents Hives and Other (See Comments)    Pulmonary problems; no frank respiratory arrest  . Iodine Hives and Other (See Comments)    Pulmonary Problems   . Lentil Anaphylaxis  . Penicillins Anaphylaxis and Shortness Of Breath    Has patient had a PCN reaction causing immediate rash, facial/tongue/throat swelling, SOB or lightheadedness with hypotension: Yes Has patient had a PCN reaction causing severe rash involving mucus membranes or skin necrosis: No Has patient had a PCN reaction that required hospitalization No Has patient had a PCN reaction occurring within the last 10 years: No If all of the above answers are "NO", then may proceed with Cephalosporin use.  Hair loss  . Lipitor [Atorvastatin] Itching and Rash  . Spironolactone Rash  . Sulfa Antibiotics Other (See Comments)    Unknown    Family History  Problem Relation Age of Onset  . Cardiomyopathy Mother     ICD pacemaker-ischmic CM  . Heart failure Mother   . Hypertension Mother   . Diabetes Mother   . Thyroid disease Mother   . Cardiomyopathy Father     Deceased  . Coronary artery disease Father   . Heart failure Father   .  Heart failure Brother   . Hypertension Brother   . Endometriosis Sister   . Colon cancer Neg Hx   . Liver disease Neg Hx      Prior to Admission medications   Medication Sig Start Date End Date Taking? Authorizing Provider  aspirin EC 81 MG tablet Take 81 mg by mouth daily.   Yes Historical Provider, MD  budesonide-formoterol (SYMBICORT) 80-4.5 MCG/ACT inhaler Inhale 2 puffs into the lungs 2 (two) times daily.   Yes Historical Provider, MD  carvedilol (COREG) 12.5 MG tablet Take 25 mg by mouth 2 (two) times daily with a meal.   Yes Historical Provider, MD  clobetasol (TEMOVATE) 0.05 % external solution Apply 1 application topically 2 (two) times daily as needed. To scalp 07/24/15  Yes Historical Provider, MD  desloratadine (CLARINEX) 5 MG tablet Take 5 mg by mouth daily.   Yes Historical Provider, MD  Ferrous Sulfate (IRON) 28 MG TABS Take 1 tablet by mouth daily.   Yes Historical Provider, MD  fluticasone (FLONASE) 50 MCG/ACT  nasal spray Place into both nostrils daily.   Yes Historical Provider, MD  furosemide (LASIX) 40 MG tablet Take 1 tablet (40 mg total) by mouth 2 (two) times daily. 08/29/12  Yes Yehuda Savannah, MD  gabapentin (NEURONTIN) 600 MG tablet Take 600 mg by mouth 3 (three) times daily. Patient takes 1 tablet in the morning and in the afternoon, then takes 2 tablets at bedtime   Yes Historical Provider, MD  levothyroxine (SYNTHROID, LEVOTHROID) 200 MCG tablet Take 200 mcg by mouth daily before breakfast. Takes with 66mcg for a total of 244mcg   Yes Historical Provider, MD  levothyroxine (SYNTHROID, LEVOTHROID) 25 MCG tablet Take 1 tablet (25 mcg total) by mouth daily before breakfast. Takes with 259mcg to equal 216mcg 07/13/15  Yes [name], MD  lisinopril (PRINIVIL,ZESTRIL) 10 MG tablet Take 1 tablet (10 mg total) by mouth daily. 08/29/12  Yes Yehuda Savannah, MD  lovastatin (MEVACOR) 40 MG tablet Take 1 tablet (40 mg total) by mouth at bedtime. 08/29/12  Yes Yehuda Savannah, MD  potassium chloride SA (K-DUR,KLOR-CON) 20 MEQ tablet Take 2 tablets (40 mEq total) by mouth 2 (two) times daily. 08/29/12  Yes Yehuda Savannah, MD  ranitidine (ZANTAC) 150 MG tablet Take 150 mg by mouth at bedtime.    Yes Historical Provider, MD  Vitamin D, Ergocalciferol, (DRISDOL) 50000 UNITS CAPS capsule Take 1 capsule (50,000 Units total) by mouth every 7 (seven) days. 03/05/15  Yes [name], MD  albuterol (PROVENTIL HFA;VENTOLIN HFA) 108 (90 BASE) MCG/ACT inhaler Inhale 2 puffs into the lungs every 6 (six) hours as needed for wheezing or shortness of breath. 07/12/12   Trinda Pascal, MD  diclofenac sodium (VOLTAREN) 1 % GEL Apply 2 g topically 4 (four) times daily as needed (pain).     Historical Provider, MD  diphenhydrAMINE (BENADRYL) 25 mg capsule Take 25 mg by mouth every 8 (eight) hours as needed for allergies.     Historical Provider, MD    Physical Exam: Filed Vitals:   07/25/15 1400 07/25/15 1415 07/25/15 1430 07/25/15 1525  BP: 106/65 115/68 120/67 126/68  Pulse: 85 86 78 77  Temp:    97.6 F (36.4 C)  TempSrc:      Resp: 21 18 18 18   Height:    5\' 7"  (1.702 m)  Weight:    131.9 kg (290 lb 12.6 oz)  SpO2: 95% 95% 98% 98%   Constitutional:  . Appears calm and comfortable Eyes:  . PERRL and irises appear normal . Normal conjunctivae and lids ENMT:  . external ears, nose appear normal . grossly normal hearing . Lips appear normal Neck:  . Appears normal Respiratory:  . CTA bilaterally, no w/r/r.  . Respiratory effort normal. No retractions or accessory muscle use Cardiovascular:  . RRR, no m/r/g . No LE extremity edema   Abdomen:  . Abdomen appears normal; no tenderness or masses Musculoskeletal:  . RUE, LUE, RLE, LLE   o strength and tone normal, no atrophy, no abnormal movements o No tenderness, masses Skin:  . No rashes, lesions, ulcers . palpation of skin: no induration or nodules Neurologic:  . CN 2-12 intact . Sensation  all 4 extremities intact . No pronator drift, no dysdiadochokinesis Psychiatric:  . judgement and insight appear normal . Mental status o Mood, affect appropriate  Wt Readings from Last 3 Encounters:  07/25/15 131.9 kg (290 lb 12.6 oz)  07/08/15 130.273 kg (287 lb 3.2 oz)  05/28/15  133.358 kg (294 lb)    I have personally reviewed following labs and imaging studies  Labs on Admission:  CBC:  Recent Labs Lab 07/25/15 1235 07/25/15 1245  WBC 8.9  --   NEUTROABS 5.5  --   HGB 14.1 15.6*  HCT 44.2 46.0  MCV 90.6  --   PLT 256  --    Basic Metabolic Panel:  Recent Labs Lab 07/25/15 1235 07/25/15 1245  NA 136 140  K 4.0 4.1  CL 104 101  CO2 26  --   GLUCOSE 114* 112*  BUN 8 7  CREATININE 0.69 0.70  CALCIUM 8.5*  --    Liver Function Tests:  Recent Labs Lab 07/25/15 1235  AST 25  ALT 25  ALKPHOS 81  BILITOT 0.4  PROT 6.9  ALBUMIN 3.8   Coagulation Profile:  Recent Labs Lab 07/25/15 1235  INR 0.98    Urine analysis:    Component Value Date/Time   COLORURINE YELLOW 04/29/2013 [place] 04/29/2013 1702   LABSPEC 1.007 04/29/2013 1702   PHURINE 6.0 04/29/2013 1702   GLUCOSEU NEGATIVE 04/29/2013 1702   HGBUR NEGATIVE 04/29/2013 1702   BILIRUBINUR NEGATIVE 04/29/2013 1702   KETONESUR NEGATIVE 04/29/2013 1702   PROTEINUR NEGATIVE 04/29/2013 1702   UROBILINOGEN 0.2 04/29/2013 1702   NITRITE NEGATIVE 04/29/2013 1702   LEUKOCYTESUR SMALL* 04/29/2013 1702   Sepsis Labs: @LABRCNTIP (procalcitonin:4,lacticidven:4) )No results found for this or any previous visit (from the past 240 hour(s)).    Radiological Exams on Admission: Ct Head Wo Contrast  07/25/2015  CLINICAL DATA:  Left-sided facial droop and left-sided weakness with slurred speech since noon time. EXAM: CT HEAD WITHOUT CONTRAST TECHNIQUE: Contiguous axial images were obtained from the base of the skull through the vertex without intravenous contrast. COMPARISON:  05/21/2014  and 05/08/2013 FINDINGS: Ventricles, cisterns and other CSF spaces are within normal. There is no mass, mass effect, shift of midline structures or acute hemorrhage. There is no evidence of acute infarction. Remaining bones soft tissues are within normal. IMPRESSION: No acute intracranial findings. These results were called by telephone at the time of interpretation on 07/25/2015 at 12:53 pm to Dr. Lacinda Axon, who verbally acknowledged these results. Electronically Signed   By: Marin Olp M.D.   On: 07/25/2015 12:53   Dg Chest Portable 1 View  07/25/2015  CLINICAL DATA:  CVA, left facial droop EXAM: PORTABLE CHEST 1 VIEW COMPARISON:  05/21/2014 FINDINGS: Borderline cardiomegaly. Single lead cardiac pacemaker is unchanged in position. No acute infiltrate or pulmonary edema. Stable bilateral basilar atelectasis or scarring. IMPRESSION: Borderline cardiomegaly. Single lead cardiac pacemaker is unchanged in position. No infiltrate or pulmonary edema. Stable bilateral basilar atelectasis or scarring. Electronically Signed   By: Lahoma Crocker M.D.   On: 07/25/2015 14:19    EKG: Independently reviewed. Sinus rhythm, no acute changes  Principal Problem:   Weakness of face muscles Active Problems:   Cardiomyopathy (HCC)   Automatic implantable cardioverter-defibrillator in situ   Chronic systolic heart failure (HCC)   Assessment/Plan 1. Left-sided facial droop, slurred speech. Resolved. Transient nature. Patient denies left-sided weakness or paresthesias. On review of her records she's had multiple instances although it has been some time. Most likely related to stress by history.We discussed further workup including echocardiogram and carotid ultrasound but as TIA or CVA are doubted at this point will proceed with observation and avoid further workup was she has recurrent symptoms. 2. Chronic systolic congestive heart failure, status post ICD implantation. Appears euvolemic.  3. COPD appears  compensated. 4. Tobacco use disorder, stable. 5. Seizure disorder, last seizure age 67   Observation, telemetry, continue aspirin, statin.  Fasting lipid profile  If no further events, anticipate discharge 5/14. She does not have any deficits.  DVT prophylaxis:Lovenox Code Status: full code Family Communication: none Disposition Plan: obs, 24 hours     Time spent: 60 minutes  Murray Hodgkins, MD  [facility] Direct contact:  --Via amion app OR  --[email or web address]; password [email or web address] and click  [email or web address] contact night coverage as above  07/25/2015, 5:37 PM

## 2015-07-26 DIAGNOSIS — I10 Essential (primary) hypertension: Secondary | ICD-10-CM | POA: Diagnosis not present

## 2015-07-26 DIAGNOSIS — E039 Hypothyroidism, unspecified: Secondary | ICD-10-CM | POA: Diagnosis not present

## 2015-07-26 DIAGNOSIS — I639 Cerebral infarction, unspecified: Secondary | ICD-10-CM | POA: Diagnosis not present

## 2015-07-26 DIAGNOSIS — J449 Chronic obstructive pulmonary disease, unspecified: Secondary | ICD-10-CM | POA: Diagnosis not present

## 2015-07-26 LAB — URINALYSIS, ROUTINE W REFLEX MICROSCOPIC
Bilirubin Urine: NEGATIVE
Glucose, UA: NEGATIVE mg/dL
Hgb urine dipstick: NEGATIVE
Ketones, ur: NEGATIVE mg/dL
NITRITE: NEGATIVE
PH: 5 (ref 5.0–8.0)
Protein, ur: NEGATIVE mg/dL

## 2015-07-26 LAB — URINE MICROSCOPIC-ADD ON

## 2015-07-26 LAB — RAPID URINE DRUG SCREEN, HOSP PERFORMED
Amphetamines: NOT DETECTED
Barbiturates: NOT DETECTED
Benzodiazepines: NOT DETECTED
Cocaine: NOT DETECTED
OPIATES: NOT DETECTED
Tetrahydrocannabinol: NOT DETECTED

## 2015-07-26 LAB — LIPID PANEL
Cholesterol: 206 mg/dL — ABNORMAL HIGH (ref 0–200)
HDL: 32 mg/dL — ABNORMAL LOW (ref >40)
LDL CALC: 143 mg/dL — AB (ref 0–99)
TRIGLYCERIDES: 154 mg/dL — AB (ref <150)
Total CHOL/HDL Ratio: 6.4 RATIO
VLDL: 31 mg/dL (ref 0–40)

## 2015-07-26 NOTE — Progress Notes (Signed)
2346 Patient notified this writer to come assist her with urgency. Upon entering patient's room, patient noted anxious and stated "I just had a funny pain on the RIGHT side of my head and up my neck and a headache." Patient noted with LEFT sided facial drooping that corrected some while talking. Speech clear. Full purposeful movement to extremities x 4. Able to roll herself from side to side in bed, able to stand up. Dr.DonDiego (on call) notified, order given to continue monitoring the patient given Hx of bell palsy and stroke.

## 2015-07-26 NOTE — Progress Notes (Signed)
Smoking cessation education discussed with patient and smoking cessation tips for success handout given to patient.

## 2015-07-26 NOTE — Progress Notes (Signed)
Subjective: Patient was admitted yesterday left side of the face weakness, numbness and slurred speech. CT Scan was negative. She feels better today.  Objective: Vital signs in last 24 hours: Temp:  [97.4 F (36.3 C)-98.2 F (36.8 C)] 98.2 F (36.8 C) (05/14 0525) Pulse Rate:  [66-90] 69 (05/14 0525) Resp:  [16-24] 18 (05/14 0525) BP: (102-138)/(56-74) 131/62 mmHg (05/14 0525) SpO2:  [94 %-99 %] 98 % (05/14 0525) Weight:  [130.182 kg (287 lb)-131.9 kg (290 lb 12.6 oz)] 131.9 kg (290 lb 12.6 oz) (05/13 1525) Weight change:  Last BM Date: 07/25/15  Intake/Output from previous day: 05/13 0701 - 05/14 0700 In: 240 [P.O.:240] Out: 750 [Urine:750]  PHYSICAL EXAM General appearance: alert and no distress Resp: diminished breath sounds bilaterally and rhonchi bilaterally Cardio: S1, S2 normal GI: soft, non-tender; bowel sounds normal; no masses,  no organomegaly Extremities: extremities normal, atraumatic, no cyanosis or edema  Lab Results:  Results for orders placed or performed during the hospital encounter of 07/25/15 (from the past 48 hour(s))  Ethanol     Status: None   Collection Time: 07/25/15 12:35 PM  Result Value Ref Range   Alcohol, Ethyl (B) <5 <5 mg/dL    Comment:        LOWEST DETECTABLE LIMIT FOR SERUM ALCOHOL IS 5 mg/dL FOR MEDICAL PURPOSES ONLY   Protime-INR     Status: None   Collection Time: 07/25/15 12:35 PM  Result Value Ref Range   Prothrombin Time 13.2 11.6 - 15.2 seconds   INR 0.98 0.00 - 1.49  APTT     Status: None   Collection Time: 07/25/15 12:35 PM  Result Value Ref Range   aPTT 26 24 - 37 seconds  CBC     Status: None   Collection Time: 07/25/15 12:35 PM  Result Value Ref Range   WBC 8.9 4.0 - 10.5 K/uL   RBC 4.88 3.87 - 5.11 MIL/uL   Hemoglobin 14.1 12.0 - 15.0 g/dL   HCT 44.2 36.0 - 46.0 %   MCV 90.6 78.0 - 100.0 fL   MCH 28.9 26.0 - 34.0 pg   MCHC 31.9 30.0 - 36.0 g/dL   RDW 13.9 11.5 - 15.5 %   Platelets 256 150 - 400 K/uL   Differential     Status: None   Collection Time: 07/25/15 12:35 PM  Result Value Ref Range   Neutrophils Relative % 62 %   Neutro Abs 5.5 1.7 - 7.7 K/uL   Lymphocytes Relative 27 %   Lymphs Abs 2.4 0.7 - 4.0 K/uL   Monocytes Relative 6 %   Monocytes Absolute 0.6 0.1 - 1.0 K/uL   Eosinophils Relative 4 %   Eosinophils Absolute 0.4 0.0 - 0.7 K/uL   Basophils Relative 1 %   Basophils Absolute 0.1 0.0 - 0.1 K/uL  Comprehensive metabolic panel     Status: Abnormal   Collection Time: 07/25/15 12:35 PM  Result Value Ref Range   Sodium 136 135 - 145 mmol/L   Potassium 4.0 3.5 - 5.1 mmol/L   Chloride 104 101 - 111 mmol/L   CO2 26 22 - 32 mmol/L   Glucose, Bld 114 (H) 65 - 99 mg/dL   BUN 8 6 - 20 mg/dL   Creatinine, Ser 0.69 0.44 - 1.00 mg/dL   Calcium 8.5 (L) 8.9 - 10.3 mg/dL   Total Protein 6.9 6.5 - 8.1 g/dL   Albumin 3.8 3.5 - 5.0 g/dL   AST 25 15 - 41 U/L  ALT 25 14 - 54 U/L   Alkaline Phosphatase 81 38 - 126 U/L   Total Bilirubin 0.4 0.3 - 1.2 mg/dL   GFR calc non Af Amer >60 >60 mL/min   GFR calc Af Amer >60 >60 mL/min    Comment: (NOTE) The eGFR has been calculated using the CKD EPI equation. This calculation has not been validated in all clinical situations. eGFR's persistently <60 mL/min signify possible Chronic Kidney Disease.    Anion gap 6 5 - 15  I-stat troponin, ED (not at [facility], [facility])     Status: None   Collection Time: 07/25/15 12:43 PM  Result Value Ref Range   Troponin i, poc 0.00 0.00 - 0.08 ng/mL   Comment 3            Comment: Due to the release kinetics of cTnI, a negative result within the first hours of the onset of symptoms does not rule out myocardial infarction with certainty. If myocardial infarction is still suspected, repeat the test at appropriate intervals.   I-Stat Chem 8, ED  (not at [facility], [facility])     Status: Abnormal   Collection Time: 07/25/15 12:45 PM  Result Value Ref Range   Sodium 140 135 - 145 mmol/L   Potassium 4.1 3.5 - 5.1 mmol/L    Chloride 101 101 - 111 mmol/L   BUN 7 6 - 20 mg/dL   Creatinine, Ser 0.70 0.44 - 1.00 mg/dL   Glucose, Bld 112 (H) 65 - 99 mg/dL   Calcium, Ion 1.02 (L) 1.12 - 1.23 mmol/L   TCO2 26 0 - 100 mmol/L   Hemoglobin 15.6 (H) 12.0 - 15.0 g/dL   HCT 46.0 36.0 - 46.0 %  Urine rapid drug screen (hosp performed)not at [facility]     Status: None   Collection Time: 07/26/15  3:00 AM  Result Value Ref Range   Opiates NONE DETECTED NONE DETECTED   Cocaine NONE DETECTED NONE DETECTED   Benzodiazepines NONE DETECTED NONE DETECTED   Amphetamines NONE DETECTED NONE DETECTED   Tetrahydrocannabinol NONE DETECTED NONE DETECTED   Barbiturates NONE DETECTED NONE DETECTED    Comment:        DRUG SCREEN FOR MEDICAL PURPOSES ONLY.  IF CONFIRMATION IS NEEDED FOR ANY PURPOSE, NOTIFY LAB WITHIN 5 DAYS.        LOWEST DETECTABLE LIMITS FOR URINE DRUG SCREEN Drug Class       Cutoff (ng/mL) Amphetamine      1000 Barbiturate      200 Benzodiazepine   130 Tricyclics       865 Opiates          300 Cocaine          300 THC              50   Urinalysis, Routine w reflex microscopic (not at [facility])     Status: Abnormal   Collection Time: 07/26/15  3:00 AM  Result Value Ref Range   Color, Urine YELLOW YELLOW   APPearance CLEAR CLEAR   Specific Gravity, Urine <1.005 (L) 1.005 - 1.030   pH 5.0 5.0 - 8.0   Glucose, UA NEGATIVE NEGATIVE mg/dL   Hgb urine dipstick NEGATIVE NEGATIVE   Bilirubin Urine NEGATIVE NEGATIVE   Ketones, ur NEGATIVE NEGATIVE mg/dL   Protein, ur NEGATIVE NEGATIVE mg/dL   Nitrite NEGATIVE NEGATIVE   Leukocytes, UA TRACE (A) NEGATIVE  Urine microscopic-add on     Status: Abnormal   Collection Time: 07/26/15  3:00  AM  Result Value Ref Range   Squamous Epithelial / LPF 0-5 (A) NONE SEEN   WBC, UA 0-5 0 - 5 WBC/hpf   RBC / HPF 0-5 0 - 5 RBC/hpf   Bacteria, UA MANY (A) NONE SEEN  Lipid panel     Status: Abnormal   Collection Time: 07/26/15  6:24 AM  Result Value Ref Range   Cholesterol 206  (H) 0 - 200 mg/dL   Triglycerides 154 (H) <150 mg/dL   HDL 32 (L) >40 mg/dL   Total CHOL/HDL Ratio 6.4 RATIO   VLDL 31 0 - 40 mg/dL   LDL Cholesterol 143 (H) 0 - 99 mg/dL    Comment:        Total Cholesterol/HDL:CHD Risk Coronary Heart Disease Risk Table                     Men   Women  1/2 Average Risk   3.4   3.3  Average Risk       5.0   4.4  2 X Average Risk   9.6   7.1  3 X Average Risk  23.4   11.0        Use the calculated Patient Ratio above and the CHD Risk Table to determine the patient's CHD Risk.        ATP III CLASSIFICATION (LDL):  <100     mg/dL   Optimal  100-129  mg/dL   Near or Above                    Optimal  130-159  mg/dL   Borderline  160-189  mg/dL   High  >190     mg/dL   Very High     ABGS  Recent Labs  07/25/15 1245  TCO2 26   CULTURES No results found for this or any previous visit (from the past 240 hour(s)). Studies/Results: Ct Head Wo Contrast  07/25/2015  CLINICAL DATA:  Left-sided facial droop and left-sided weakness with slurred speech since noon time. EXAM: CT HEAD WITHOUT CONTRAST TECHNIQUE: Contiguous axial images were obtained from the base of the skull through the vertex without intravenous contrast. COMPARISON:  05/21/2014 and 05/08/2013 FINDINGS: Ventricles, cisterns and other CSF spaces are within normal. There is no mass, mass effect, shift of midline structures or acute hemorrhage. There is no evidence of acute infarction. Remaining bones soft tissues are within normal. IMPRESSION: No acute intracranial findings. These results were called by telephone at the time of interpretation on 07/25/2015 at 12:53 pm to Dr. Lacinda Axon, who verbally acknowledged these results. Electronically Signed   By: Marin Olp M.D.   On: 07/25/2015 12:53   Dg Chest Portable 1 View  07/25/2015  CLINICAL DATA:  CVA, left facial droop EXAM: PORTABLE CHEST 1 VIEW COMPARISON:  05/21/2014 FINDINGS: Borderline cardiomegaly. Single lead cardiac pacemaker is  unchanged in position. No acute infiltrate or pulmonary edema. Stable bilateral basilar atelectasis or scarring. IMPRESSION: Borderline cardiomegaly. Single lead cardiac pacemaker is unchanged in position. No infiltrate or pulmonary edema. Stable bilateral basilar atelectasis or scarring. Electronically Signed   By: Lahoma Crocker M.D.   On: 07/25/2015 14:19    Medications: I have reviewed the patient's current medications.  Assesment:   Principal Problem:   Weakness of face muscles Active Problems:   Cardiomyopathy ([place])   Automatic implantable cardioverter-defibrillator in situ   Chronic systolic heart failure ([place])    Plan:  Medications reviewed Will do neurology  consult Continue current treatment      Krishang Reading 07/26/2015, 8:50 AM

## 2015-07-27 DIAGNOSIS — J449 Chronic obstructive pulmonary disease, unspecified: Secondary | ICD-10-CM | POA: Diagnosis not present

## 2015-07-27 DIAGNOSIS — I10 Essential (primary) hypertension: Secondary | ICD-10-CM | POA: Diagnosis not present

## 2015-07-27 DIAGNOSIS — R569 Unspecified convulsions: Secondary | ICD-10-CM | POA: Diagnosis not present

## 2015-07-27 DIAGNOSIS — I639 Cerebral infarction, unspecified: Secondary | ICD-10-CM | POA: Diagnosis not present

## 2015-07-27 DIAGNOSIS — R51 Headache: Secondary | ICD-10-CM | POA: Diagnosis not present

## 2015-07-27 DIAGNOSIS — E039 Hypothyroidism, unspecified: Secondary | ICD-10-CM | POA: Diagnosis not present

## 2015-07-27 LAB — SEDIMENTATION RATE: Sed Rate: 8 mm/hr (ref 0–22)

## 2015-07-27 LAB — HEMOGLOBIN A1C
Hgb A1c MFr Bld: 5.7 % — ABNORMAL HIGH (ref 4.8–5.6)
MEAN PLASMA GLUCOSE: 117 mg/dL

## 2015-07-27 LAB — TSH: TSH: 0.235 u[IU]/mL — ABNORMAL LOW (ref 0.350–4.500)

## 2015-07-27 NOTE — Care Management Obs Status (Signed)
[place] NOTIFICATION   Patient Details  Name: Melanie Cordova MRN: [id number] Date of Birth: Feb 27, 1961   Medicare Observation Status Notification Given:  Yes    Sherald Barge, RN 07/27/2015, 12:34 PM

## 2015-07-27 NOTE — Care Management Note (Signed)
Case Management Note  Patient Details  Name: Melanie Cordova MRN: [id number] Date of Birth: 13-Apr-1960  Subjective/Objective:                  Pt admitted with muscle weakness. Pt is from home, lives alone and has aid that comes 3.5 hrs per day, 5 days a week. Pt uses a cane with ambulation, has a BSC and shower chair but no other DME. Pt plans to return home with self care and aid at DC.   Action/Plan: No CM needs anticipated.   Expected Discharge Date:  07/27/15               Expected Discharge Plan:  Home/Self Care  In-House Referral:  NA  Discharge planning Services  CM Consult  Post Acute Care Choice:  NA Choice offered to:  NA  DME Arranged:    DME Agency:     HH Arranged:    HH Agency:     Status of Service:  Completed, signed off  Medicare Important Message Given:    Date Medicare IM Given:    Medicare IM give by:    Date Additional Medicare IM Given:    Additional Medicare Important Message give by:     If discussed at [place] of Stay Meetings, dates discussed:    Additional Comments:  Sherald Barge, RN 07/27/2015, 2:48 PM

## 2015-07-27 NOTE — Progress Notes (Signed)
Observation of patient  showed no facial drooping and no slurred speech.  Pt appeared A/O when asked if she had any complaints.  She was watching TV and thumbing thru a magazine.  She denied any assistance offered.

## 2015-07-27 NOTE — Progress Notes (Signed)
[id number] Patient reported that she is experiencing LEFT sided facial drooping that comes and goes. At this time of assessment no facial drooping noted. Patient denies swallowing difficulties at this time and has requested something to eat and able to swallow water w/o difficulty. Snack given to patient, eating w/o difficulty at this time.

## 2015-07-27 NOTE — Progress Notes (Signed)
Subjective: Patient feels better. She is ambulating on the hallway. Her symptoms has resolved. Neurology consult is pending.  Objective: Vital signs in last 24 hours: Temp:  [97.8 F (36.6 C)-98.5 F (36.9 C)] 97.9 F (36.6 C) (05/15 0741) Pulse Rate:  [63-77] 75 (05/15 0741) Resp:  [16-20] 17 (05/15 0741) BP: (113-135)/(62-87) 125/87 mmHg (05/15 0741) SpO2:  [93 %-100 %] 97 % (05/15 0741) Weight change:  Last BM Date: 07/25/15  Intake/Output from previous day: 05/14 0701 - 05/15 0700 In: 480 [P.O.:480] Out: 500 [Urine:500]  PHYSICAL EXAM General appearance: alert and no distress Resp: diminished breath sounds bilaterally and rhonchi bilaterally Cardio: S1, S2 normal GI: soft, non-tender; bowel sounds normal; no masses,  no organomegaly Extremities: extremities normal, atraumatic, no cyanosis or edema  Lab Results:  Results for orders placed or performed during the hospital encounter of 07/25/15 (from the past 48 hour(s))  Ethanol     Status: None   Collection Time: 07/25/15 12:35 PM  Result Value Ref Range   Alcohol, Ethyl (B) <5 <5 mg/dL    Comment:        LOWEST DETECTABLE LIMIT FOR SERUM ALCOHOL IS 5 mg/dL FOR MEDICAL PURPOSES ONLY   Protime-INR     Status: None   Collection Time: 07/25/15 12:35 PM  Result Value Ref Range   Prothrombin Time 13.2 11.6 - 15.2 seconds   INR 0.98 0.00 - 1.49  APTT     Status: None   Collection Time: 07/25/15 12:35 PM  Result Value Ref Range   aPTT 26 24 - 37 seconds  CBC     Status: None   Collection Time: 07/25/15 12:35 PM  Result Value Ref Range   WBC 8.9 4.0 - 10.5 K/uL   RBC 4.88 3.87 - 5.11 MIL/uL   Hemoglobin 14.1 12.0 - 15.0 g/dL   HCT 44.2 36.0 - 46.0 %   MCV 90.6 78.0 - 100.0 fL   MCH 28.9 26.0 - 34.0 pg   MCHC 31.9 30.0 - 36.0 g/dL   RDW 13.9 11.5 - 15.5 %   Platelets 256 150 - 400 K/uL  Differential     Status: None   Collection Time: 07/25/15 12:35 PM  Result Value Ref Range   Neutrophils Relative % 62 %   Neutro Abs 5.5 1.7 - 7.7 K/uL   Lymphocytes Relative 27 %   Lymphs Abs 2.4 0.7 - 4.0 K/uL   Monocytes Relative 6 %   Monocytes Absolute 0.6 0.1 - 1.0 K/uL   Eosinophils Relative 4 %   Eosinophils Absolute 0.4 0.0 - 0.7 K/uL   Basophils Relative 1 %   Basophils Absolute 0.1 0.0 - 0.1 K/uL  Comprehensive metabolic panel     Status: Abnormal   Collection Time: 07/25/15 12:35 PM  Result Value Ref Range   Sodium 136 135 - 145 mmol/L   Potassium 4.0 3.5 - 5.1 mmol/L   Chloride 104 101 - 111 mmol/L   CO2 26 22 - 32 mmol/L   Glucose, Bld 114 (H) 65 - 99 mg/dL   BUN 8 6 - 20 mg/dL   Creatinine, Ser 0.69 0.44 - 1.00 mg/dL   Calcium 8.5 (L) 8.9 - 10.3 mg/dL   Total Protein 6.9 6.5 - 8.1 g/dL   Albumin 3.8 3.5 - 5.0 g/dL   AST 25 15 - 41 U/L   ALT 25 14 - 54 U/L   Alkaline Phosphatase 81 38 - 126 U/L   Total Bilirubin 0.4 0.3 - 1.2 mg/dL  GFR calc non Af Amer >60 >60 mL/min   GFR calc Af Amer >60 >60 mL/min    Comment: (NOTE) The eGFR has been calculated using the CKD EPI equation. This calculation has not been validated in all clinical situations. eGFR's persistently <60 mL/min signify possible Chronic Kidney Disease.    Anion gap 6 5 - 15  I-stat troponin, ED (not at [facility], [facility])     Status: None   Collection Time: 07/25/15 12:43 PM  Result Value Ref Range   Troponin i, poc 0.00 0.00 - 0.08 ng/mL   Comment 3            Comment: Due to the release kinetics of cTnI, a negative result within the first hours of the onset of symptoms does not rule out myocardial infarction with certainty. If myocardial infarction is still suspected, repeat the test at appropriate intervals.   I-Stat Chem 8, ED  (not at [facility], [facility])     Status: Abnormal   Collection Time: 07/25/15 12:45 PM  Result Value Ref Range   Sodium 140 135 - 145 mmol/L   Potassium 4.1 3.5 - 5.1 mmol/L   Chloride 101 101 - 111 mmol/L   BUN 7 6 - 20 mg/dL   Creatinine, Ser 0.70 0.44 - 1.00 mg/dL   Glucose, Bld 112 (H) 65 - 99  mg/dL   Calcium, Ion 1.02 (L) 1.12 - 1.23 mmol/L   TCO2 26 0 - 100 mmol/L   Hemoglobin 15.6 (H) 12.0 - 15.0 g/dL   HCT 46.0 36.0 - 46.0 %  Urine rapid drug screen (hosp performed)not at [facility]     Status: None   Collection Time: 07/26/15  3:00 AM  Result Value Ref Range   Opiates NONE DETECTED NONE DETECTED   Cocaine NONE DETECTED NONE DETECTED   Benzodiazepines NONE DETECTED NONE DETECTED   Amphetamines NONE DETECTED NONE DETECTED   Tetrahydrocannabinol NONE DETECTED NONE DETECTED   Barbiturates NONE DETECTED NONE DETECTED    Comment:        DRUG SCREEN FOR MEDICAL PURPOSES ONLY.  IF CONFIRMATION IS NEEDED FOR ANY PURPOSE, NOTIFY LAB WITHIN 5 DAYS.        LOWEST DETECTABLE LIMITS FOR URINE DRUG SCREEN Drug Class       Cutoff (ng/mL) Amphetamine      1000 Barbiturate      200 Benzodiazepine   637 Tricyclics       858 Opiates          300 Cocaine          300 THC              50   Urinalysis, Routine w reflex microscopic (not at [facility])     Status: Abnormal   Collection Time: 07/26/15  3:00 AM  Result Value Ref Range   Color, Urine YELLOW YELLOW   APPearance CLEAR CLEAR   Specific Gravity, Urine <1.005 (L) 1.005 - 1.030   pH 5.0 5.0 - 8.0   Glucose, UA NEGATIVE NEGATIVE mg/dL   Hgb urine dipstick NEGATIVE NEGATIVE   Bilirubin Urine NEGATIVE NEGATIVE   Ketones, ur NEGATIVE NEGATIVE mg/dL   Protein, ur NEGATIVE NEGATIVE mg/dL   Nitrite NEGATIVE NEGATIVE   Leukocytes, UA TRACE (A) NEGATIVE  Urine microscopic-add on     Status: Abnormal   Collection Time: 07/26/15  3:00 AM  Result Value Ref Range   Squamous Epithelial / LPF 0-5 (A) NONE SEEN   WBC, UA 0-5 0 - 5 WBC/hpf  RBC / HPF 0-5 0 - 5 RBC/hpf   Bacteria, UA MANY (A) NONE SEEN  Lipid panel     Status: Abnormal   Collection Time: 07/26/15  6:24 AM  Result Value Ref Range   Cholesterol 206 (H) 0 - 200 mg/dL   Triglycerides 154 (H) <150 mg/dL   HDL 32 (L) >40 mg/dL   Total CHOL/HDL Ratio 6.4 RATIO   VLDL 31 0 - 40  mg/dL   LDL Cholesterol 143 (H) 0 - 99 mg/dL    Comment:        Total Cholesterol/HDL:CHD Risk Coronary Heart Disease Risk Table                     Men   Women  1/2 Average Risk   3.4   3.3  Average Risk       5.0   4.4  2 X Average Risk   9.6   7.1  3 X Average Risk  23.4   11.0        Use the calculated Patient Ratio above and the CHD Risk Table to determine the patient's CHD Risk.        ATP III CLASSIFICATION (LDL):  <100     mg/dL   Optimal  100-129  mg/dL   Near or Above                    Optimal  130-159  mg/dL   Borderline  160-189  mg/dL   High  >190     mg/dL   Very High     ABGS  Recent Labs  07/25/15 1245  TCO2 26   CULTURES No results found for this or any previous visit (from the past 240 hour(s)). Studies/Results: Ct Head Wo Contrast  07/25/2015  CLINICAL DATA:  Left-sided facial droop and left-sided weakness with slurred speech since noon time. EXAM: CT HEAD WITHOUT CONTRAST TECHNIQUE: Contiguous axial images were obtained from the base of the skull through the vertex without intravenous contrast. COMPARISON:  05/21/2014 and 05/08/2013 FINDINGS: Ventricles, cisterns and other CSF spaces are within normal. There is no mass, mass effect, shift of midline structures or acute hemorrhage. There is no evidence of acute infarction. Remaining bones soft tissues are within normal. IMPRESSION: No acute intracranial findings. These results were called by telephone at the time of interpretation on 07/25/2015 at 12:53 pm to Dr. Lacinda Axon, who verbally acknowledged these results. Electronically Signed   By: Marin Olp M.D.   On: 07/25/2015 12:53   Dg Chest Portable 1 View  07/25/2015  CLINICAL DATA:  CVA, left facial droop EXAM: PORTABLE CHEST 1 VIEW COMPARISON:  05/21/2014 FINDINGS: Borderline cardiomegaly. Single lead cardiac pacemaker is unchanged in position. No acute infiltrate or pulmonary edema. Stable bilateral basilar atelectasis or scarring. IMPRESSION: Borderline  cardiomegaly. Single lead cardiac pacemaker is unchanged in position. No infiltrate or pulmonary edema. Stable bilateral basilar atelectasis or scarring. Electronically Signed   By: Lahoma Crocker M.D.   On: 07/25/2015 14:19    Medications: I have reviewed the patient's current medications.  Assesment:   Principal Problem:   Weakness of face muscles Active Problems:   Cardiomyopathy ([place])   Automatic implantable cardioverter-defibrillator in situ   Chronic systolic heart failure ([place])    Plan:  Medications reviewed neurology consult pending Continue current treatment      Melanie Cordova 07/27/2015, 8:14 AM

## 2015-07-27 NOTE — Consult Note (Signed)
Melanie A. Merlene Laughter, MD     [email or web address]          Melanie Cordova is an 55 y.o. female.   ASSESSMENT/PLAN: The patient has unexplained the neurological symptoms. She does have associated headaches which raises the possibility of a complex migraine/migraine with aura. Additionally, partial seizures could be a possibility. Given the non-anatomic description of these events, psychosomatic disorders are also concerning. Other potential diagnosis includes demyelinating processes and ischemic stroke. Believe these are unlikely however.  RECCOMENDATION: Repeat imaging will be considered. She cannot have MRI as she has a defibrillator. We will consider repeat head CT scan and possibly CTA of the head and neck. However, she appears to have IV contrast allergy. We may still consider a CTA of the head and neck but she will need to be premedicated. A simple repeat imaging noted CT scan may be done.  EEG  Additional labs for the following: RPR, C-reactive protein, sedimentation rate, ANA, vitamin B12, homocysteine and HIV. Also thyroid function test.  The patient is a 55 year old white female who developed the acute onset of severe right occipital headaches associated with flexor spasm of the right hand. The event lasted for about 5 minutes and was associated with visual obscuration described as flashing lights/dots in her vision. The event told her resolve the but she had another event this time with frontal and occipital headaches in the low side. This time she had flexure spasm of the left hand, left facial droop involving the lower facial area and dysarthria. She is now left with a residual mild headache. The patient has a remote history of severe headaches but has not had headaches in a very long time. She also tells me that she has had seizures but has not had one since 55 years old. It appears that she has been having rapid fluctuation of the left lower facial weakness  over the last couple days since these develop. The symptoms seem to wax and wane for no clear reason. The patient also reports sensory disturbance involving the left upper and left lower extremity. She does have a baseline history of right leg numbness which we have seen her in in the past. The ongoing diagnoses most likely neuropathy. The patient does not report other symptoms at this time. She denies chest pain, shortness of breath, dizziness, vertiginous symptoms or diplopia. The review of systems is otherwise negative.  GENERAL: Obese anxious female in no acute distress.  HEENT: Supple. Atraumatic normocephalic.   ABDOMEN: soft  EXTREMITIES: No edema   BACK: Normal.  SKIN: Normal by inspection.    MENTAL STATUS: Alert and oriented. Speech, language and cognition are generally intact. Judgment and insight normal.   CRANIAL NERVES: Pupils are equal, round and reactive to light and accommodation; extra ocular movements are full, there is no significant nystagmus; visual fields are full. There appears to be flattening of the nasolabial fold on the left side. However, on all pouching of the mouth and the left side is apparently weak while the right side is strong. The tongue deviates to the left all the way over that it appears to be factitious.  The uvula is midline; shoulder elevation is normal.  MOTOR: Normal tone, bulk and strength; no pronator drift.  COORDINATION: Left finger to nose is normal, right finger to nose is normal, No rest tremor; no intention tremor; no postural tremor; no bradykinesia.  REFLEXES: Deep tendon reflexes are symmetrical and normal. Babinski reflexes are equivocal bilaterally.  SENSATION: Reduce to light touch on the L.   Blood pressure 122/70, pulse 76, temperature 98.2 F (36.8 C), temperature source Oral, resp. rate 22, height 5\' 7"  (1.702 m), weight 290 lb 12.6 oz (131.9 kg), SpO2 96 %.  Past Medical History  Diagnosis Date  . Hypertension      09/2010-normal CMet and CBC; Lipid profile-116, 88, 25, 73  . Cardiomyopathy 08/2010    Presented with congestive heart failure; EF of 15% and 2012; hypotension on medication precludes optimal dosing  . Gastroesophageal reflux disease   . Pneumonia   . Obesity   . Hyperlipidemia   . Tobacco abuse     20 pack years  . CHF (congestive heart failure) ([place])   . Asthma   . Shortness of breath   . Headache(784.0)   . Arthritis   . Anxiety   . Neuropathy ([place])   . 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 Chronic systolic heart failure ([place])   . Dysrhythmia   . Automatic implantable cardioverter-defibrillator in situ     2013, july  . COPD (chronic obstructive pulmonary disease) ([place])     2013  . Bell palsy     states has had 3 episodes  . Stroke [facility])      stroke in 07/2012 and another in June, 2014  . Seizures ([place])     last one at age 58  . Neuromuscular disorder ([facility])     Neuropathy, right foot and leg  . Hypothyroidism     Recent TSH was normal.    Past Surgical History  Procedure Laterality Date  . Cesarean section      X2  . Tee without cardioversion N/A 07/11/2012    Procedure: TRANSESOPHAGEAL ECHOCARDIOGRAM (TEE);  Surgeon: Thayer Headings, MD;  Location: [facility];  Service: Cardiovascular;  Laterality: N/A;  . Tubal ligation    . Multiple extractions with alveoloplasty N/A 09/24/2012    Procedure: MULTIPLE EXTRACION #2, 4, 6, 7 ,8, 9, 11, 13, 18, 20, 21, 22, 23, 24, 25, 26, 27, 29 WITH ALVEOLOPLASTY, BIOPSY OF PALATE LESION, REMOVA RIGHT LINGUAL TORUS;  Surgeon: Gae Bon, DDS;  Location: [facility];  Service: Oral Surgery;  Laterality: N/A;  . Ep implantable device      St. Jude  . Colonoscopy N/A 12/16/2013    Procedure: COLONOSCOPY;  Surgeon: Danie Binder, MD;  [facility];  Service: Endoscopy;  Laterality: N/A;  10:45-moved to 10/5 @ [place] notified pt  . Implantable cardioverter defibrillator implant N/A 10/10/2011     Procedure: IMPLANTABLE CARDIOVERTER DEFIBRILLATOR IMPLANT;  Surgeon: Evans Lance, MD;  Location: [facility] CATH LAB;  Service: Cardiovascular;  Laterality: N/A;    Family History  Problem Relation Age of Onset  . Cardiomyopathy Mother     ICD pacemaker-ischmic CM  . Heart failure Mother   . Hypertension Mother   . Diabetes Mother   . Thyroid disease Mother   . Cardiomyopathy Father     Deceased  . Coronary artery disease Father   . Heart failure Father   . Heart failure Brother   . Hypertension Brother   . Endometriosis Sister   . Colon cancer Neg Hx   . Liver disease Neg Hx     Social History:  reports that she has been smoking Cigarettes.  She started smoking about 32 years ago. She has a 10.5 pack-year smoking history. She has never used smokeless tobacco. She reports that she does  not drink alcohol or use illicit drugs.  Allergies:  Allergies  Allergen Reactions  . Fish Allergy Anaphylaxis  . Iodinated Diagnostic Agents Hives and Other (See Comments)    Pulmonary problems; no frank respiratory arrest  . Iodine Hives and Other (See Comments)    Pulmonary Problems   . Lentil Anaphylaxis  . Penicillins Anaphylaxis and Shortness Of Breath    Has patient had a PCN reaction causing immediate rash, facial/tongue/throat swelling, SOB or lightheadedness with hypotension: Yes Has patient had a PCN reaction causing severe rash involving mucus membranes or skin necrosis: No Has patient had a PCN reaction that required hospitalization No Has patient had a PCN reaction occurring within the last 10 years: No If all of the above answers are "NO", then may proceed with Cephalosporin use.  Hair loss  . Lipitor [Atorvastatin] Itching and Rash  . Spironolactone Rash  . Sulfa Antibiotics Other (See Comments)    Unknown    Medications: Prior to Admission medications   Medication Sig Start Date End Date Taking? Authorizing Provider  aspirin EC 81 MG tablet Take 81 mg by mouth daily.   Yes  Historical Provider, MD  budesonide-formoterol (SYMBICORT) 80-4.5 MCG/ACT inhaler Inhale 2 puffs into the lungs 2 (two) times daily.   Yes Historical Provider, MD  carvedilol (COREG) 12.5 MG tablet Take 25 mg by mouth 2 (two) times daily with a meal.   Yes Historical Provider, MD  clobetasol (TEMOVATE) 0.05 % external solution Apply 1 application topically 2 (two) times daily as needed. To scalp 07/24/15  Yes Historical Provider, MD  desloratadine (CLARINEX) 5 MG tablet Take 5 mg by mouth daily.   Yes Historical Provider, MD  Ferrous Sulfate (IRON) 28 MG TABS Take 1 tablet by mouth daily.   Yes Historical Provider, MD  fluticasone (FLONASE) 50 MCG/ACT nasal spray Place into both nostrils daily.   Yes Historical Provider, MD  furosemide (LASIX) 40 MG tablet Take 1 tablet (40 mg total) by mouth 2 (two) times daily. 08/29/12  Yes Yehuda Savannah, MD  gabapentin (NEURONTIN) 600 MG tablet Take 600 mg by mouth 3 (three) times daily. Patient takes 1 tablet in the morning and in the afternoon, then takes 2 tablets at bedtime   Yes Historical Provider, MD  levothyroxine (SYNTHROID, LEVOTHROID) 200 MCG tablet Take 200 mcg by mouth daily before breakfast. Takes with 80mcg for a total of 233mcg   Yes Historical Provider, MD  levothyroxine (SYNTHROID, LEVOTHROID) 25 MCG tablet Take 1 tablet (25 mcg total) by mouth daily before breakfast. Takes with 252mcg to equal 238mcg 07/13/15  Yes [name], MD  lisinopril (PRINIVIL,ZESTRIL) 10 MG tablet Take 1 tablet (10 mg total) by mouth daily. 08/29/12  Yes Yehuda Savannah, MD  lovastatin (MEVACOR) 40 MG tablet Take 1 tablet (40 mg total) by mouth at bedtime. 08/29/12  Yes Yehuda Savannah, MD  potassium chloride SA (K-DUR,KLOR-CON) 20 MEQ tablet Take 2 tablets (40 mEq total) by mouth 2 (two) times daily. 08/29/12  Yes Yehuda Savannah, MD  ranitidine (ZANTAC) 150 MG tablet Take 150 mg by mouth at bedtime.    Yes Historical Provider, MD  Vitamin D,  Ergocalciferol, (DRISDOL) 50000 UNITS CAPS capsule Take 1 capsule (50,000 Units total) by mouth every 7 (seven) days. 03/05/15  Yes [name], MD  albuterol (PROVENTIL HFA;VENTOLIN HFA) 108 (90 BASE) MCG/ACT inhaler Inhale 2 puffs into the lungs every 6 (six) hours as needed for wheezing or shortness of breath. 07/12/12  Trinda Pascal, MD  diclofenac sodium (VOLTAREN) 1 % GEL Apply 2 g topically 4 (four) times daily as needed (pain).     Historical Provider, MD  diphenhydrAMINE (BENADRYL) 25 mg capsule Take 25 mg by mouth every 8 (eight) hours as needed for allergies.     Historical Provider, MD    Scheduled Meds: . aspirin EC  81 mg Oral Daily  . carvedilol  25 mg Oral BID WC  . enoxaparin (LOVENOX) injection  40 mg Subcutaneous Q24H  . famotidine  20 mg Oral QHS  . furosemide  40 mg Oral BID  . gabapentin  600 mg Oral BID   And  . gabapentin  1,200 mg Oral QHS  . levothyroxine  200 mcg Oral QAC breakfast  . levothyroxine  25 mcg Oral QAC breakfast  . mometasone-formoterol  2 puff Inhalation BID  . potassium chloride SA  40 mEq Oral BID   Continuous Infusions:  PRN Meds:.albuterol, diphenhydrAMINE     Results for orders placed or performed during the hospital encounter of 07/25/15 (from the past 48 hour(s))  Urine rapid drug screen (hosp performed)not at [facility]     Status: None   Collection Time: 07/26/15  3:00 AM  Result Value Ref Range   Opiates NONE DETECTED NONE DETECTED   Cocaine NONE DETECTED NONE DETECTED   Benzodiazepines NONE DETECTED NONE DETECTED   Amphetamines NONE DETECTED NONE DETECTED   Tetrahydrocannabinol NONE DETECTED NONE DETECTED   Barbiturates NONE DETECTED NONE DETECTED    Comment:        DRUG SCREEN FOR MEDICAL PURPOSES ONLY.  IF CONFIRMATION IS NEEDED FOR ANY PURPOSE, NOTIFY LAB WITHIN 5 DAYS.        LOWEST DETECTABLE LIMITS FOR URINE DRUG SCREEN Drug Class       Cutoff (ng/mL) Amphetamine      1000 Barbiturate      200 Benzodiazepine    [id number] Tricyclics       [id number] Opiates          300 Cocaine          300 THC              50   Urinalysis, Routine w reflex microscopic (not at [facility])     Status: Abnormal   Collection Time: 07/26/15  3:00 AM  Result Value Ref Range   Color, Urine YELLOW YELLOW   APPearance CLEAR CLEAR   Specific Gravity, Urine <1.005 (L) 1.005 - 1.030   pH 5.0 5.0 - 8.0   Glucose, UA NEGATIVE NEGATIVE mg/dL   Hgb urine dipstick NEGATIVE NEGATIVE   Bilirubin Urine NEGATIVE NEGATIVE   Ketones, ur NEGATIVE NEGATIVE mg/dL   Protein, ur NEGATIVE NEGATIVE mg/dL   Nitrite NEGATIVE NEGATIVE   Leukocytes, UA TRACE (A) NEGATIVE  Urine microscopic-add on     Status: Abnormal   Collection Time: 07/26/15  3:00 AM  Result Value Ref Range   Squamous Epithelial / LPF 0-5 (A) NONE SEEN   WBC, UA 0-5 0 - 5 WBC/hpf   RBC / HPF 0-5 0 - 5 RBC/hpf   Bacteria, UA MANY (A) NONE SEEN  Hemoglobin A1c     Status: Abnormal   Collection Time: 07/26/15  6:24 AM  Result Value Ref Range   Hgb A1c MFr Bld 5.7 (H) 4.8 - 5.6 %    Comment: (NOTE)         Pre-diabetes: 5.7 - 6.4         Diabetes: >6.4  Glycemic control for adults with diabetes: <7.0    Mean Plasma Glucose 117 mg/dL    Comment: (NOTE) Performed At: [facility] [place], [place] [id number] Lindon Romp MD [id number]   Lipid panel     Status: Abnormal   Collection Time: 07/26/15  6:24 AM  Result Value Ref Range   Cholesterol 206 (H) 0 - 200 mg/dL   Triglycerides 154 (H) <150 mg/dL   HDL 32 (L) >40 mg/dL   Total CHOL/HDL Ratio 6.4 RATIO   VLDL 31 0 - 40 mg/dL   LDL Cholesterol 143 (H) 0 - 99 mg/dL    Comment:        Total Cholesterol/HDL:CHD Risk Coronary Heart Disease Risk Table                     Men   Women  1/2 Average Risk   3.4   3.3  Average Risk       5.0   4.4  2 X Average Risk   9.6   7.1  3 X Average Risk  23.4   11.0        Use the calculated Patient Ratio above and the CHD Risk Table to determine  the patient's CHD Risk.        ATP III CLASSIFICATION (LDL):  <100     mg/dL   Optimal  100-129  mg/dL   Near or Above                    Optimal  130-159  mg/dL   Borderline  160-189  mg/dL   High  >190     mg/dL   Very High     Studies/Results:  HEAD CT Ventricles, cisterns and other CSF spaces are within normal. There is no mass, mass effect, shift of midline structures or acute hemorrhage. There is no evidence of acute infarction. Remaining bones soft tissues are within normal.  IMPRESSION: No acute intracranial findings.   Namiyah Grantham A. Merlene Cordova, M.D.  Diplomate, Tax adviser of Psychiatry and Neurology ( Neurology). 07/27/2015, 7:55 PM

## 2015-07-28 ENCOUNTER — Other Ambulatory Visit (HOSPITAL_COMMUNITY): Payer: Medicare Other

## 2015-07-28 DIAGNOSIS — E039 Hypothyroidism, unspecified: Secondary | ICD-10-CM | POA: Diagnosis not present

## 2015-07-28 DIAGNOSIS — I10 Essential (primary) hypertension: Secondary | ICD-10-CM | POA: Diagnosis not present

## 2015-07-28 DIAGNOSIS — I639 Cerebral infarction, unspecified: Secondary | ICD-10-CM | POA: Diagnosis not present

## 2015-07-28 DIAGNOSIS — J449 Chronic obstructive pulmonary disease, unspecified: Secondary | ICD-10-CM | POA: Diagnosis not present

## 2015-07-28 LAB — VITAMIN B12: VITAMIN B 12: 328 pg/mL (ref 180–914)

## 2015-07-28 LAB — C-REACTIVE PROTEIN: CRP: 0.7 mg/dL (ref <1.0)

## 2015-07-28 MED ORDER — DIPHENHYDRAMINE HCL 25 MG PO CAPS: 50.0000 mg | ORAL_CAPSULE | Freq: Once | ORAL | Status: DC

## 2015-07-28 MED ORDER — SODIUM CHLORIDE 0.9 % IV SOLN
250.0000 mg | Freq: Once | INTRAVENOUS | Status: DC
  Filled 2015-07-28: qty 2

## 2015-07-28 NOTE — Discharge Summary (Signed)
Physician Discharge Summary  Patient ID: Melanie Cordova MRN: [id number] DOB/AGE: June 03, 1960 55 y.o. Primary Care Physician:Gicela Schwarting, MD Admit date: 07/25/2015 Discharge date: 07/28/2015    Discharge Diagnoses:     Principal Problem:   Weakness of face muscles Active Problems:   Cardiomyopathy ([place])   Automatic implantable cardioverter-defibrillator in situ   Chronic systolic heart failure ([place])     Medication List    TAKE these medications        albuterol 108 (90 Base) MCG/ACT inhaler  Commonly known as:  PROVENTIL HFA;VENTOLIN HFA  Inhale 2 puffs into the lungs every 6 (six) hours as needed for wheezing or shortness of breath.     aspirin EC 81 MG tablet  Take 81 mg by mouth daily.     budesonide-formoterol 80-4.5 MCG/ACT inhaler  Commonly known as:  SYMBICORT  Inhale 2 puffs into the lungs 2 (two) times daily.     carvedilol 12.5 MG tablet  Commonly known as:  COREG  Take 25 mg by mouth 2 (two) times daily with a meal.     clobetasol 0.05 % external solution  Commonly known as:  TEMOVATE  Apply 1 application topically 2 (two) times daily as needed. To scalp     desloratadine 5 MG tablet  Commonly known as:  CLARINEX  Take 5 mg by mouth daily.     diclofenac sodium 1 % Gel  Commonly known as:  VOLTAREN  Apply 2 g topically 4 (four) times daily as needed (pain).     diphenhydrAMINE 25 mg capsule  Commonly known as:  BENADRYL  Take 25 mg by mouth every 8 (eight) hours as needed for allergies.     fluticasone 50 MCG/ACT nasal spray  Commonly known as:  FLONASE  Place into both nostrils daily.     furosemide 40 MG tablet  Commonly known as:  LASIX  Take 1 tablet (40 mg total) by mouth 2 (two) times daily.     gabapentin 600 MG tablet  Commonly known as:  NEURONTIN  Take 600 mg by mouth 3 (three) times daily. Patient takes 1 tablet in the morning and in the afternoon, then takes 2 tablets at bedtime     Iron 28 MG Tabs  Take 1 tablet by mouth  daily.     levothyroxine 200 MCG tablet  Commonly known as:  SYNTHROID, LEVOTHROID  Take 200 mcg by mouth daily before breakfast. Takes with 10mcg for a total of 257mcg     levothyroxine 25 MCG tablet  Commonly known as:  SYNTHROID, LEVOTHROID  Take 1 tablet (25 mcg total) by mouth daily before breakfast. Takes with 278mcg to equal 229mcg     lisinopril 10 MG tablet  Commonly known as:  PRINIVIL,ZESTRIL  Take 1 tablet (10 mg total) by mouth daily.     lovastatin 40 MG tablet  Commonly known as:  MEVACOR  Take 1 tablet (40 mg total) by mouth at bedtime.     potassium chloride SA 20 MEQ tablet  Commonly known as:  K-DUR,KLOR-CON  Take 2 tablets (40 mEq total) by mouth 2 (two) times daily.     ranitidine 150 MG tablet  Commonly known as:  ZANTAC  Take 150 mg by mouth at bedtime.     Vitamin D (Ergocalciferol) 50000 units Caps capsule  Commonly known as:  DRISDOL  Take 1 capsule (50,000 Units total) by mouth every 7 (seven) days.        Discharged Condition: improved    Consults:  Neurology  Significant Diagnostic Studies: Ct Head Wo Contrast  07/25/2015  CLINICAL DATA:  Left-sided facial droop and left-sided weakness with slurred speech since noon time. EXAM: CT HEAD WITHOUT CONTRAST TECHNIQUE: Contiguous axial images were obtained from the base of the skull through the vertex without intravenous contrast. COMPARISON:  05/21/2014 and 05/08/2013 FINDINGS: Ventricles, cisterns and other CSF spaces are within normal. There is no mass, mass effect, shift of midline structures or acute hemorrhage. There is no evidence of acute infarction. Remaining bones soft tissues are within normal. IMPRESSION: No acute intracranial findings. These results were called by telephone at the time of interpretation on 07/25/2015 at 12:53 pm to Dr. Lacinda Axon, who verbally acknowledged these results. Electronically Signed   By: Marin Olp M.D.   On: 07/25/2015 12:53   Dg Chest Portable 1 View  07/25/2015   CLINICAL DATA:  CVA, left facial droop EXAM: PORTABLE CHEST 1 VIEW COMPARISON:  05/21/2014 FINDINGS: Borderline cardiomegaly. Single lead cardiac pacemaker is unchanged in position. No acute infiltrate or pulmonary edema. Stable bilateral basilar atelectasis or scarring. IMPRESSION: Borderline cardiomegaly. Single lead cardiac pacemaker is unchanged in position. No infiltrate or pulmonary edema. Stable bilateral basilar atelectasis or scarring. Electronically Signed   By: Lahoma Crocker M.D.   On: 07/25/2015 14:19    Lab Results: Basic Metabolic Panel:  Recent Labs  07/25/15 1235 07/25/15 1245  NA 136 140  K 4.0 4.1  CL 104 101  CO2 26  --   GLUCOSE 114* 112*  BUN 8 7  CREATININE 0.69 0.70  CALCIUM 8.5*  --    Liver Function Tests:  Recent Labs  07/25/15 1235  AST 25  ALT 25  ALKPHOS 81  BILITOT 0.4  PROT 6.9  ALBUMIN 3.8     CBC:  Recent Labs  07/25/15 1235 07/25/15 1245  WBC 8.9  --   NEUTROABS 5.5  --   HGB 14.1 15.6*  HCT 44.2 46.0  MCV 90.6  --   PLT 256  --     No results found for this or any previous visit (from the past 240 hour(s)).   Hospital Course: This is a 55 years old female with history of multiple medical illnesses was admitted due to left side face weakness and slurred speech. Ct scan was negative for acute lesion. Patient was seen by neurologist and recommended to repeat imaging. Patient overall improved. She is being discharged in stable condition to be followed in out patient.    Discharge Exam: Blood pressure 117/70, pulse 60, temperature 97.9 F (36.6 C), temperature source Oral, resp. rate 20, height 5\' 7"  (1.702 m), weight 131.9 kg (290 lb 12.6 oz), SpO2 95 %.   Disposition: home        Follow-up Information    Follow up with [facility], [place].   Specialty:  Internal Medicine   Contact information:   [place] [address] [phone]       Signed: Rielynn Trulson   07/28/2015, 8:06 AM

## 2015-07-28 NOTE — Progress Notes (Signed)
Discharge instructions given, verbalized understanding, out in stable condition via w/c with staff. 

## 2015-07-29 LAB — RPR: RPR: NONREACTIVE

## 2015-07-29 LAB — HIV ANTIBODY (ROUTINE TESTING W REFLEX): HIV Screen 4th Generation wRfx: NONREACTIVE

## 2015-07-29 LAB — HOMOCYSTEINE: Homocysteine: 9.4 umol/L (ref 0.0–15.0)

## 2015-07-29 LAB — ANTINUCLEAR ANTIBODIES, IFA: ANA Ab, IFA: NEGATIVE

## 2015-07-30 ENCOUNTER — Ambulatory Visit (INDEPENDENT_AMBULATORY_CARE_PROVIDER_SITE_OTHER): Payer: Medicare Other | Admitting: Otolaryngology

## 2015-07-30 DIAGNOSIS — J310 Chronic rhinitis: Secondary | ICD-10-CM

## 2015-07-30 DIAGNOSIS — G4459 Other complicated headache syndrome: Secondary | ICD-10-CM | POA: Diagnosis not present

## 2015-07-30 DIAGNOSIS — M5416 Radiculopathy, lumbar region: Secondary | ICD-10-CM | POA: Diagnosis not present

## 2015-07-30 DIAGNOSIS — J343 Hypertrophy of nasal turbinates: Secondary | ICD-10-CM

## 2015-07-30 DIAGNOSIS — M1990 Unspecified osteoarthritis, unspecified site: Secondary | ICD-10-CM | POA: Diagnosis not present

## 2015-07-30 DIAGNOSIS — J342 Deviated nasal septum: Secondary | ICD-10-CM | POA: Diagnosis not present

## 2015-07-31 ENCOUNTER — Emergency Department (HOSPITAL_COMMUNITY): Payer: Medicare Other

## 2015-07-31 ENCOUNTER — Ambulatory Visit (HOSPITAL_COMMUNITY): Admission: RE | Admit: 2015-07-31 | Payer: Medicare Other | Source: Ambulatory Visit

## 2015-07-31 ENCOUNTER — Emergency Department (HOSPITAL_COMMUNITY)
Admission: EM | Admit: 2015-07-31 | Discharge: 2015-07-31 | Disposition: A | Discharge status: Home / Self Care | Payer: Medicare Other | Attending: Emergency Medicine | Admitting: Emergency Medicine

## 2015-07-31 ENCOUNTER — Encounter (HOSPITAL_COMMUNITY): Payer: Self-pay | Admitting: Emergency Medicine

## 2015-07-31 DIAGNOSIS — J449 Chronic obstructive pulmonary disease, unspecified: Secondary | ICD-10-CM | POA: Diagnosis not present

## 2015-07-31 DIAGNOSIS — F17210 Nicotine dependence, cigarettes, uncomplicated: Secondary | ICD-10-CM | POA: Insufficient documentation

## 2015-07-31 DIAGNOSIS — J45909 Unspecified asthma, uncomplicated: Secondary | ICD-10-CM | POA: Diagnosis not present

## 2015-07-31 DIAGNOSIS — R29810 Facial weakness: Secondary | ICD-10-CM | POA: Diagnosis not present

## 2015-07-31 DIAGNOSIS — I110 Hypertensive heart disease with heart failure: Secondary | ICD-10-CM | POA: Insufficient documentation

## 2015-07-31 DIAGNOSIS — I5022 Chronic systolic (congestive) heart failure: Secondary | ICD-10-CM | POA: Diagnosis not present

## 2015-07-31 DIAGNOSIS — Z79899 Other long term (current) drug therapy: Secondary | ICD-10-CM | POA: Insufficient documentation

## 2015-07-31 DIAGNOSIS — E039 Hypothyroidism, unspecified: Secondary | ICD-10-CM | POA: Diagnosis not present

## 2015-07-31 DIAGNOSIS — Z7982 Long term (current) use of aspirin: Secondary | ICD-10-CM | POA: Insufficient documentation

## 2015-07-31 DIAGNOSIS — R519 Headache, unspecified: Secondary | ICD-10-CM

## 2015-07-31 DIAGNOSIS — G450 Vertebro-basilar artery syndrome: Secondary | ICD-10-CM

## 2015-07-31 DIAGNOSIS — Z6841 Body Mass Index (BMI) 40.0 and over, adult: Secondary | ICD-10-CM | POA: Insufficient documentation

## 2015-07-31 DIAGNOSIS — E669 Obesity, unspecified: Secondary | ICD-10-CM | POA: Insufficient documentation

## 2015-07-31 DIAGNOSIS — G459 Transient cerebral ischemic attack, unspecified: Secondary | ICD-10-CM | POA: Diagnosis not present

## 2015-07-31 DIAGNOSIS — E785 Hyperlipidemia, unspecified: Secondary | ICD-10-CM | POA: Diagnosis not present

## 2015-07-31 DIAGNOSIS — I6789 Other cerebrovascular disease: Secondary | ICD-10-CM | POA: Diagnosis not present

## 2015-07-31 DIAGNOSIS — R51 Headache: Secondary | ICD-10-CM | POA: Insufficient documentation

## 2015-07-31 LAB — I-STAT CHEM 8, ED
BUN: 12 mg/dL (ref 6–20)
Calcium, Ion: 1.12 mmol/L (ref 1.12–1.23)
Chloride: 101 mmol/L (ref 101–111)
Creatinine, Ser: 0.7 mg/dL (ref 0.44–1.00)
GLUCOSE: 85 mg/dL (ref 65–99)
HCT: 46 % (ref 36.0–46.0)
HEMOGLOBIN: 15.6 g/dL — AB (ref 12.0–15.0)
POTASSIUM: 4.1 mmol/L (ref 3.5–5.1)
Sodium: 140 mmol/L (ref 135–145)
TCO2: 29 mmol/L (ref 0–100)

## 2015-07-31 LAB — COMPREHENSIVE METABOLIC PANEL
ALT: 24 U/L (ref 14–54)
ANION GAP: 8 (ref 5–15)
AST: 19 U/L (ref 15–41)
Albumin: 3.8 g/dL (ref 3.5–5.0)
Alkaline Phosphatase: 78 U/L (ref 38–126)
BILIRUBIN TOTAL: 0.6 mg/dL (ref 0.3–1.2)
BUN: 12 mg/dL (ref 6–20)
CHLORIDE: 103 mmol/L (ref 101–111)
CO2: 26 mmol/L (ref 22–32)
CREATININE: 0.58 mg/dL (ref 0.44–1.00)
Calcium: 9 mg/dL (ref 8.9–10.3)
Glucose, Bld: 90 mg/dL (ref 65–99)
POTASSIUM: 3.9 mmol/L (ref 3.5–5.1)
Sodium: 137 mmol/L (ref 135–145)
TOTAL PROTEIN: 7 g/dL (ref 6.5–8.1)

## 2015-07-31 LAB — DIFFERENTIAL
BASOS ABS: 0 10*3/uL (ref 0.0–0.1)
BASOS PCT: 0 %
EOS ABS: 0.4 10*3/uL (ref 0.0–0.7)
EOS PCT: 4 %
LYMPHS ABS: 2.6 10*3/uL (ref 0.7–4.0)
Lymphocytes Relative: 27 %
MONO ABS: 0.9 10*3/uL (ref 0.1–1.0)
MONOS PCT: 9 %
Neutro Abs: 5.5 10*3/uL (ref 1.7–7.7)
Neutrophils Relative %: 58 %

## 2015-07-31 LAB — CBC
HEMATOCRIT: 44.4 % (ref 36.0–46.0)
HEMOGLOBIN: 14.8 g/dL (ref 12.0–15.0)
MCH: 29.8 pg (ref 26.0–34.0)
MCHC: 33.3 g/dL (ref 30.0–36.0)
MCV: 89.5 fL (ref 78.0–100.0)
Platelets: 262 10*3/uL (ref 150–400)
RBC: 4.96 MIL/uL (ref 3.87–5.11)
RDW: 14.1 % (ref 11.5–15.5)
WBC: 9.4 10*3/uL (ref 4.0–10.5)

## 2015-07-31 LAB — PROTIME-INR
INR: 1.03 (ref 0.00–1.49)
Prothrombin Time: 13.7 seconds (ref 11.6–15.2)

## 2015-07-31 LAB — APTT: APTT: 27 s (ref 24–37)

## 2015-07-31 LAB — ETHANOL

## 2015-07-31 LAB — I-STAT TROPONIN, ED: Troponin i, poc: 0 ng/mL (ref 0.00–0.08)

## 2015-07-31 MED ORDER — DIPHENHYDRAMINE HCL 50 MG/ML IJ SOLN: 50.0000 mg | Freq: Once | INTRAMUSCULAR | Status: DC

## 2015-07-31 MED ORDER — HYDROCORTISONE NA SUCCINATE PF 100 MG IJ SOLR
200.0000 mg | Freq: Once | INTRAMUSCULAR | Status: DC
  Filled 2015-07-31: qty 4

## 2015-07-31 MED ORDER — MORPHINE SULFATE (PF) 4 MG/ML IV SOLN
4.0000 mg | Freq: Once | INTRAVENOUS | Status: DC
Start: 2015-07-31 — End: 2015-07-31
  Filled 2015-07-31: qty 1

## 2015-07-31 MED ORDER — PROCHLORPERAZINE EDISYLATE 5 MG/ML IJ SOLN
10.0000 mg | Freq: Once | INTRAMUSCULAR | Status: AC
  Administered 2015-07-31: 10 mg via INTRAVENOUS
  Filled 2015-07-31: qty 2

## 2015-07-31 MED ORDER — DIPHENHYDRAMINE HCL 50 MG/ML IJ SOLN
25.0000 mg | Freq: Once | INTRAMUSCULAR | Status: AC
  Administered 2015-07-31: 25 mg via INTRAVENOUS
  Filled 2015-07-31: qty 1

## 2015-07-31 NOTE — Discharge Instructions (Signed)

## 2015-07-31 NOTE — ED Notes (Signed)
Patient has left mouth drooping while resting in bed. While patient speaks, facial movements symmetrical with no left sided mouth drooping noted. Patient has equal strength bilaterally. Complaining of headache since 1000 this morning.

## 2015-07-31 NOTE — ED Notes (Signed)
Went to give patient Solu-Cortef and explained delay to patient. Patient states "I don't want that IV dye. Please don't make me do it. I'm scared after what happened last time. Please, please don't make me take that IV dye." Patient is allergic to the IV dye. Advised Dr Tomi Bamberger.

## 2015-07-31 NOTE — ED Notes (Signed)
Per EMS, patient states she started having headache at 1000 this morning and called her PCP and was told to go to ER. States she started having facial droop at 1300 today. Patient mouth drooping on left side. Symmetrical facial movements. Equal grips bilaterally. Per EMS, patient ambulated to truck upon arrival with no difficulty.

## 2015-07-31 NOTE — ED Provider Notes (Signed)
CSN: [id number]     Arrival date & time 07/31/15  1332 History   First MD Initiated Contact with Patient 07/31/15 1339     Chief Complaint  Patient presents with  . Facial Droop   HPI Patient presents to the emergency room with complaints of headache and facial drooping.  Patient states she started having a headache this morning approximately 10:00. It feels like a band in the back of her head. Associated with those symptoms she started having drooping of the left side of her face at about 1 PM. Patient called EMS and was brought to the emergency room. He is denying any difficulty with her strength. No trouble with her coordination. No trouble with her speech. No visual symptoms. Patient states she has history of migraine headaches but has not had one in many years. However when the patient was in the hospital just 6 days ago she was having some complaints of headache at that time as well. The patient was evaluated in the hospital by neurology and medicine. She had a normal CT scan and ultimately felt her symptoms were not likely related to a stroke. The patient's evaluation is, located by the fact that she has a contrast allergy and also has an AICD so she could not get an MRI. Past Medical History  Diagnosis Date  . Hypertension     09/2010-normal CMet and CBC; Lipid profile-116, 88, 25, 73  . Cardiomyopathy 08/2010    Presented with congestive heart failure; EF of 15% and 2012; hypotension on medication precludes optimal dosing  . Gastroesophageal reflux disease   . Pneumonia   . Obesity   . Hyperlipidemia   . Tobacco abuse     20 pack years  . CHF (congestive heart failure) ([place])   . Asthma   . Shortness of breath   . Headache(784.0)   . Arthritis   . Anxiety   . Neuropathy ([place])   . 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 Chronic systolic heart failure ([place])   . Dysrhythmia   . Automatic implantable cardioverter-defibrillator in situ     2013, july  . COPD  (chronic obstructive pulmonary disease) ([place])     2013  . Bell palsy     states has had 3 episodes  . Stroke [facility])      stroke in 07/2012 and another in June, 2014  . Seizures ([place])     last one at age 31  . Neuromuscular disorder (HCC)     Neuropathy, right foot and leg  . Hypothyroidism     Recent TSH was normal.   Past Surgical History  Procedure Laterality Date  . Cesarean section      X2  . Tee without cardioversion N/A 07/11/2012    Procedure: TRANSESOPHAGEAL ECHOCARDIOGRAM (TEE);  Surgeon: Thayer Headings, MD;  Location: [place];  Service: Cardiovascular;  Laterality: N/A;  . Tubal ligation    . Multiple extractions with alveoloplasty N/A 09/24/2012    Procedure: MULTIPLE EXTRACION #2, 4, 6, 7 ,8, 9, 11, 13, 18, 20, 21, 22, 23, 24, 25, 26, 27, 29 WITH ALVEOLOPLASTY, BIOPSY OF PALATE LESION, REMOVA RIGHT LINGUAL TORUS;  Surgeon: Gae Bon, DDS;  Location: [place];  Service: Oral Surgery;  Laterality: N/A;  . Ep implantable device      St. Jude  . Colonoscopy N/A 12/16/2013    Procedure: COLONOSCOPY;  Surgeon: Danie Binder, MD;  [facility];  Service: Endoscopy;  Laterality: N/A;  10:45-moved to 10/5 @ [place] notified pt  . Implantable cardioverter defibrillator implant N/A 10/10/2011    Procedure: IMPLANTABLE CARDIOVERTER DEFIBRILLATOR IMPLANT;  Surgeon: Evans Lance, MD;  Location: [facility] CATH LAB;  Service: Cardiovascular;  Laterality: N/A;   Family History  Problem Relation Age of Onset  . Cardiomyopathy Mother     ICD pacemaker-ischmic CM  . Heart failure Mother   . Hypertension Mother   . Diabetes Mother   . Thyroid disease Mother   . Cardiomyopathy Father     Deceased  . Coronary artery disease Father   . Heart failure Father   . Heart failure Brother   . Hypertension Brother   . Endometriosis Sister   . Colon cancer Neg Hx   . Liver disease Neg Hx    Social History  Substance Use Topics  . Smoking status: Current Every Day Smoker --  0.50 packs/day for 21 years    Types: Cigarettes    Start date: 07/02/1983  . Smokeless tobacco: Never Used     Comment: 5 ciggs daily 11-26-12  . Alcohol Use: No   OB History    No data available     Review of Systems  All other systems reviewed and are negative.     Allergies  Fish allergy; Iodinated diagnostic agents; Iodine; Lentil; Penicillins; Lipitor; Spironolactone; and Sulfa antibiotics  Home Medications   Prior to Admission medications   Medication Sig Start Date End Date Taking? Authorizing Provider  aspirin EC 81 MG tablet Take 81 mg by mouth daily.   Yes Historical Provider, MD  budesonide-formoterol (SYMBICORT) 80-4.5 MCG/ACT inhaler Inhale 2 puffs into the lungs 2 (two) times daily.   Yes Historical Provider, MD  carvedilol (COREG) 12.5 MG tablet Take 25 mg by mouth 2 (two) times daily with a meal.   Yes Historical Provider, MD  clobetasol (TEMOVATE) 0.05 % external solution Apply 1 application topically 2 (two) times daily as needed. To scalp 07/24/15  Yes Historical Provider, MD  desloratadine (CLARINEX) 5 MG tablet Take 5 mg by mouth daily.   Yes Historical Provider, MD  Ferrous Sulfate (IRON) 28 MG TABS Take 1 tablet by mouth daily.   Yes Historical Provider, MD  fluticasone (FLONASE) 50 MCG/ACT nasal spray Place into both nostrils daily.   Yes Historical Provider, MD  furosemide (LASIX) 40 MG tablet Take 1 tablet (40 mg total) by mouth 2 (two) times daily. 08/29/12  Yes Yehuda Savannah, MD  gabapentin (NEURONTIN) 600 MG tablet Take 600 mg by mouth 3 (three) times daily. Patient takes 1 tablet in the morning and in the afternoon, then takes 2 tablets at bedtime   Yes Historical Provider, MD  levothyroxine (SYNTHROID, LEVOTHROID) 200 MCG tablet Take 200 mcg by mouth daily before breakfast. Takes with 75mcg for a total of 258mcg   Yes Historical Provider, MD  levothyroxine (SYNTHROID, LEVOTHROID) 25 MCG tablet Take 1 tablet (25 mcg total) by mouth daily before  breakfast. Takes with 241mcg to equal 212mcg 07/13/15  Yes [name], MD  lisinopril (PRINIVIL,ZESTRIL) 10 MG tablet Take 1 tablet (10 mg total) by mouth daily. 08/29/12  Yes Yehuda Savannah, MD  lovastatin (MEVACOR) 40 MG tablet Take 1 tablet (40 mg total) by mouth at bedtime. 08/29/12  Yes Yehuda Savannah, MD  potassium chloride SA (K-DUR,KLOR-CON) 20 MEQ tablet Take 2 tablets (40 mEq total) by mouth 2 (two) times daily. 08/29/12  Yes Yehuda Savannah, MD  ranitidine (ZANTAC) 150 MG  tablet Take 150 mg by mouth at bedtime.    Yes Historical Provider, MD  albuterol (PROVENTIL HFA;VENTOLIN HFA) 108 (90 BASE) MCG/ACT inhaler Inhale 2 puffs into the lungs every 6 (six) hours as needed for wheezing or shortness of breath. 07/12/12   Trinda Pascal, MD  diclofenac sodium (VOLTAREN) 1 % GEL Apply 2 g topically 4 (four) times daily as needed (pain).     Historical Provider, MD  diphenhydrAMINE (BENADRYL) 25 mg capsule Take 25 mg by mouth every 8 (eight) hours as needed for allergies.     Historical Provider, MD  Vitamin D, Ergocalciferol, (DRISDOL) 50000 UNITS CAPS capsule Take 1 capsule (50,000 Units total) by mouth every 7 (seven) days. 03/05/15   [name], MD   BP 126/87 mmHg  Pulse 74  Temp(Src) 98.1 F (36.7 C) (Oral)  Resp 17  Ht 5\' 7"  (1.702 m)  Wt 131.543 kg  BMI 45.41 kg/m2  SpO2 100% Physical Exam  Constitutional: She is oriented to person, place, and time. No distress.  HENT:  Head: Normocephalic and atraumatic.  Right Ear: External ear normal.  Left Ear: External ear normal.  Mouth/Throat: Oropharynx is clear and moist.  Eyes: Conjunctivae are normal. Right eye exhibits no discharge. Left eye exhibits no discharge. No scleral icterus.  Neck: Neck supple. No tracheal deviation present.  Cardiovascular: Normal rate, regular rhythm and intact distal pulses.   Pulmonary/Chest: Effort normal and breath sounds normal. No stridor. No respiratory distress. She has no  wheezes. She has no rales.  Abdominal: Soft. Bowel sounds are normal. She exhibits no distension. There is no tenderness. There is no rebound and no guarding.  Musculoskeletal: She exhibits no edema or tenderness.  Neurological: She is alert and oriented to person, place, and time. She has normal strength. No cranial nerve deficit ( extraocular movements intact, tongue midline, at rest side of her face appears to be drooping downward, when I asked the patient to smile the left side of her face races normally in the right side does not move symmetrically  ) or sensory deficit. She exhibits normal muscle tone. She displays no seizure activity. Coordination normal.  No pronator drift bilateral upper extrem, able to hold both legs off bed for 5 seconds, sensation intact in all extremities, no visual field cuts, no left or right sided neglect, normal finger-nose exam bilaterally, no nystagmus noted   Skin: Skin is warm and dry. No rash noted. She is not diaphoretic.  Psychiatric: She has a normal mood and affect.  Nursing note and vitals reviewed.   ED Course  Procedures (including critical care time)  Medications  morphine 4 MG/ML injection 4 mg (4 mg Intravenous Not Given 07/31/15 1523)  hydrocortisone sodium succinate (SOLU-CORTEF) 100 MG injection 200 mg (200 mg Intravenous Not Given 07/31/15 1557)  diphenhydrAMINE (BENADRYL) injection 50 mg (not administered)  prochlorperazine (COMPAZINE) injection 10 mg (10 mg Intravenous Given 07/31/15 1438)  diphenhydrAMINE (BENADRYL) injection 25 mg (25 mg Intravenous Given 07/31/15 1439)    Labs Review Labs Reviewed  I-STAT CHEM 8, ED - Abnormal; Notable for the following:    Hemoglobin 15.6 (*)    All other components within normal limits  ETHANOL  PROTIME-INR  APTT  CBC  DIFFERENTIAL  COMPREHENSIVE METABOLIC PANEL  URINE RAPID DRUG SCREEN, HOSP PERFORMED  I-STAT TROPOININ, ED    Imaging Review Ct Head Wo Contrast  07/31/2015  CLINICAL DATA:   Headache and left-sided facial droop 1 day EXAM: CT HEAD WITHOUT CONTRAST  TECHNIQUE: Contiguous axial images were obtained from the base of the skull through the vertex without intravenous contrast. COMPARISON:  Jul 25, 2015 FINDINGS: The ventricles are normal in size and configuration. There is no intracranial mass, hemorrhage, extra-axial fluid collection, or midline shift. Gray-white compartments are normal. No acute infarct evident. The bony calvarium appears intact. The mastoid air cells are clear. No intraorbital lesions are evident. IMPRESSION: Study within normal limits and stable. Electronically Signed   By: Lowella Grip III M.D.   On: 07/31/2015 16:17   I have personally reviewed and evaluated these images and lab results as part of my medical decision-making.   EKG Interpretation   Date/Time:  Friday Jul 31 2015 13:45:23 EDT Ventricular Rate:  69 PR Interval:  155 QRS Duration: 119 QT Interval:  472 QTC Calculation: 506 R Axis:   -26 Text Interpretation:  Sinus rhythm Nonspecific intraventricular conduction  delay Low voltage, precordial leads Borderline T abnormalities, diffuse  leads No significant change since last tracing Confirmed by Keshawn Sundberg  MD-J,  Elyn Krogh [id number]) on 07/31/2015 1:51:31 PM      MDM   Final diagnoses:  Headache, unspecified headache type  Facial droop    Discussed case with Neurology., NP Florene Glen.  She is familiar with the patient from her recent hospitalization and requested a CT angio of her head and neck.  I discussed this plan with the patient.  1550  Pt's headache has resolved and her facial symptoms have resolved.  She does not want to get the CT scan.  She is concerned about her contrast allergy even with the planned premedication treatment.   Spoke with Dr Merlene Laughter to let him know pt is refusing contrasted study.  She is certainly at risk for cerebral vascular occlusion although her symptoms are suggesting possible complex migraine with the  headache and inconsistent neurologic exam.  Stable for discharge.  Follow up with neurology  Dorie Rank, MD 07/31/15 1623

## 2015-08-05 DIAGNOSIS — J449 Chronic obstructive pulmonary disease, unspecified: Secondary | ICD-10-CM | POA: Diagnosis not present

## 2015-08-05 DIAGNOSIS — I10 Essential (primary) hypertension: Secondary | ICD-10-CM | POA: Diagnosis not present

## 2015-08-05 DIAGNOSIS — I499 Cardiac arrhythmia, unspecified: Secondary | ICD-10-CM | POA: Diagnosis not present

## 2015-08-07 ENCOUNTER — Ambulatory Visit (INDEPENDENT_AMBULATORY_CARE_PROVIDER_SITE_OTHER): Payer: Medicare Other | Admitting: Internal Medicine

## 2015-08-07 ENCOUNTER — Encounter: Payer: Self-pay | Admitting: *Deleted

## 2015-08-07 ENCOUNTER — Telehealth: Payer: Self-pay | Admitting: Internal Medicine

## 2015-08-07 ENCOUNTER — Encounter: Payer: Self-pay | Admitting: Internal Medicine

## 2015-08-07 VITALS — BP 120/74 | HR 79 | Ht 67.5 in | Wt 287.0 lb

## 2015-08-07 DIAGNOSIS — I429 Cardiomyopathy, unspecified: Secondary | ICD-10-CM | POA: Diagnosis not present

## 2015-08-07 DIAGNOSIS — I5022 Chronic systolic (congestive) heart failure: Secondary | ICD-10-CM | POA: Diagnosis not present

## 2015-08-07 DIAGNOSIS — R0789 Other chest pain: Secondary | ICD-10-CM

## 2015-08-07 LAB — CUP PACEART INCLINIC DEVICE CHECK
HighPow Impedance: 87.75 Ohm
Implantable Lead Location: 753860
Lead Channel Impedance Value: 612.5 Ohm
Lead Channel Pacing Threshold Amplitude: 0.75 V
Lead Channel Pacing Threshold Pulse Width: 0.5 ms
Lead Channel Setting Pacing Amplitude: 2.5 V
MDC IDC LEAD IMPLANT DT: 20130729
MDC IDC LEAD MODEL: 181
MDC IDC LEAD SERIAL: 322336
MDC IDC MSMT BATTERY REMAINING LONGEVITY: 69.6
MDC IDC MSMT LEADCHNL RV PACING THRESHOLD AMPLITUDE: 0.75 V
MDC IDC MSMT LEADCHNL RV PACING THRESHOLD PULSEWIDTH: 0.5 ms
MDC IDC MSMT LEADCHNL RV SENSING INTR AMPL: 12 mV
MDC IDC PG SERIAL: 1022442
MDC IDC SESS DTM: 20170526104229
MDC IDC SET LEADCHNL RV PACING PULSEWIDTH: 0.5 ms
MDC IDC SET LEADCHNL RV SENSING SENSITIVITY: 0.5 mV
MDC IDC STAT BRADY RV PERCENT PACED: 0 %

## 2015-08-07 NOTE — Telephone Encounter (Signed)
Pt is having trouble getting transportation scheduled for her loop recorder

## 2015-08-07 NOTE — Patient Instructions (Signed)
Your physician wants you to follow-up in: 1 year with Dr. Lovena Le. You will receive a reminder letter in the mail two months in advance. If you don't receive a letter, please call our office to schedule the follow-up appointment.  Remote monitoring is used to monitor your Pacemaker of ICD from home. This monitoring reduces the number of office visits required to check your device to one time per year. It allows [place] to keep an eye on the functioning of your device to ensure it is working properly. You are scheduled for a device check from home on 11/09/15. You may send your transmission at any time that day. If you have a wireless device, the transmission will be sent automatically. After your physician reviews your transmission, you will receive a postcard with your next transmission date.  Your physician recommends that you continue on your current medications as directed. Please refer to the Current Medication list given to you today.  Your physician has requested that you have a lexiscan myoview. For further information please visit [email or web address]. Please follow instruction sheet, as given.  Your physician has requested that you have a implanted Loop recorder placed.   If you need a refill on your cardiac medications before your next appointment, please call your pharmacy.  Thank you for choosing [facility]!

## 2015-08-07 NOTE — Telephone Encounter (Signed)
Spoke with pt and she was able to get transportation on June 1

## 2015-08-07 NOTE — Progress Notes (Signed)
HPI [name] returns today for followup. She is a very pleasant 55 year old woman with an ischemic cardiomyopathy, chronic systolic heart failure, severe back pain, hypertension, and COPD. She denies shortness of breath. No ICD shock. No peripheral edema. She continues to smoke cigarettes but has reduced her consumption down to a half pack a day. She is trying to lose weight. She is frustrated by her inability and is seeing a dietician. Last week she was seen in the ER with neuro symptoms. No obvious stroke and her symptoms including facial droop and difficulty with speech resolved completely. A CT of head was unrevealing. She notes an episode of chest pressure which radiated to her back. It resolved after 4 hours. Allergies  Allergen Reactions  . Fish Allergy Anaphylaxis  . Iodinated Diagnostic Agents Hives and Other (See Comments)    Pulmonary problems; no frank respiratory arrest  . Iodine Hives and Other (See Comments)    Pulmonary Problems   . Lentil Anaphylaxis  . Penicillins Anaphylaxis and Shortness Of Breath    Has patient had a PCN reaction causing immediate rash, facial/tongue/throat swelling, SOB or lightheadedness with hypotension: Yes Has patient had a PCN reaction causing severe rash involving mucus membranes or skin necrosis: No Has patient had a PCN reaction that required hospitalization No Has patient had a PCN reaction occurring within the last 10 years: No If all of the above answers are "NO", then may proceed with Cephalosporin use.  Hair loss  . Lipitor [Atorvastatin] Itching and Rash  . Spironolactone Rash  . Sulfa Antibiotics Other (See Comments)    Unknown     Current Outpatient Prescriptions  Medication Sig Dispense Refill  . albuterol (PROVENTIL HFA;VENTOLIN HFA) 108 (90 BASE) MCG/ACT inhaler Inhale 2 puffs into the lungs every 6 (six) hours as needed for wheezing or shortness of breath. 1 Inhaler 5  . aspirin EC 81 MG tablet Take 81 mg by mouth daily.    .  budesonide-formoterol (SYMBICORT) 80-4.5 MCG/ACT inhaler Inhale 2 puffs into the lungs 2 (two) times daily.    . carvedilol (COREG) 12.5 MG tablet Take 25 mg by mouth 2 (two) times daily with a meal.    . clobetasol (TEMOVATE) 0.05 % external solution Apply 1 application topically 2 (two) times daily as needed. To scalp    . desloratadine (CLARINEX) 5 MG tablet Take 5 mg by mouth daily.    . diclofenac sodium (VOLTAREN) 1 % GEL Apply 2 g topically 4 (four) times daily as needed (pain).     Marland Kitchen diphenhydrAMINE (BENADRYL) 25 mg capsule Take 25 mg by mouth every 8 (eight) hours as needed for allergies.     . Ferrous Sulfate (IRON) 28 MG TABS Take 1 tablet by mouth daily.    . fluticasone (FLONASE) 50 MCG/ACT nasal spray Place into both nostrils daily.    . furosemide (LASIX) 40 MG tablet Take 1 tablet (40 mg total) by mouth 2 (two) times daily. 180 tablet 0  . gabapentin (NEURONTIN) 600 MG tablet Take 600 mg by mouth 3 (three) times daily. Patient takes 1 tablet in the morning and in the afternoon, then takes 2 tablets at bedtime    . levothyroxine (SYNTHROID, LEVOTHROID) 200 MCG tablet Take 200 mcg by mouth daily before breakfast. Takes with 28mcg for a total of 235mcg    . levothyroxine (SYNTHROID, LEVOTHROID) 25 MCG tablet Take 1 tablet (25 mcg total) by mouth daily before breakfast. Takes with 219mcg to equal 277mcg 90 tablet 1  .  lisinopril (PRINIVIL,ZESTRIL) 10 MG tablet Take 1 tablet (10 mg total) by mouth daily. 90 tablet 0  . lovastatin (MEVACOR) 40 MG tablet Take 1 tablet (40 mg total) by mouth at bedtime. 90 tablet 0  . potassium chloride SA (K-DUR,KLOR-CON) 20 MEQ tablet Take 2 tablets (40 mEq total) by mouth 2 (two) times daily. 180 tablet 0  . ranitidine (ZANTAC) 150 MG tablet Take 150 mg by mouth at bedtime.     . Vitamin D, Ergocalciferol, (DRISDOL) 50000 UNITS CAPS capsule Take 1 capsule (50,000 Units total) by mouth every 7 (seven) days. 12 capsule 1   No current facility-administered  medications for this visit.     Past Medical History  Diagnosis Date  . Hypertension     09/2010-normal CMet and CBC; Lipid profile-116, 88, 25, 73  . Cardiomyopathy 08/2010    Presented with congestive heart failure; EF of 15% and 2012; hypotension on medication precludes optimal dosing  . Gastroesophageal reflux disease   . Pneumonia   . Obesity   . Hyperlipidemia   . Tobacco abuse     20 pack years  . CHF (congestive heart failure) ([place])   . Asthma   . Shortness of breath   . Headache(784.0)   . Arthritis   . Anxiety   . Neuropathy ([place])   . 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 Chronic systolic heart failure ([place])   . Dysrhythmia   . Automatic implantable cardioverter-defibrillator in situ     2013, july  . COPD (chronic obstructive pulmonary disease) ([place])     2013  . Bell palsy     states has had 3 episodes  . Stroke [facility])      stroke in 07/2012 and another in June, 2014  . Seizures ([place])     last one at age 88  . Neuromuscular disorder (HCC)     Neuropathy, right foot and leg  . Hypothyroidism     Recent TSH was normal.    ROS:   All systems reviewed and negative except as noted in the HPI.   Past Surgical History  Procedure Laterality Date  . Cesarean section      X2  . Tee without cardioversion N/A 07/11/2012    Procedure: TRANSESOPHAGEAL ECHOCARDIOGRAM (TEE);  Surgeon: Thayer Headings, MD;  Location: [place];  Service: Cardiovascular;  Laterality: N/A;  . Tubal ligation    . Multiple extractions with alveoloplasty N/A 09/24/2012    Procedure: MULTIPLE EXTRACION #2, 4, 6, 7 ,8, 9, 11, 13, 18, 20, 21, 22, 23, 24, 25, 26, 27, 29 WITH ALVEOLOPLASTY, BIOPSY OF PALATE LESION, REMOVA RIGHT LINGUAL TORUS;  Surgeon: Gae Bon, DDS;  Location: [place];  Service: Oral Surgery;  Laterality: N/A;  . Ep implantable device      St. Jude  . Colonoscopy N/A 12/16/2013    Procedure: COLONOSCOPY;  Surgeon: Danie Binder, MD;  [facility];  Service: Endoscopy;  Laterality: N/A;  10:45-moved to 10/5 @ [place] notified pt  . Implantable cardioverter defibrillator implant N/A 10/10/2011    Procedure: IMPLANTABLE CARDIOVERTER DEFIBRILLATOR IMPLANT;  Surgeon: Evans Lance, MD;  Location: [facility] CATH LAB;  Service: Cardiovascular;  Laterality: N/A;     Family History  Problem Relation Age of Onset  . Cardiomyopathy Mother     ICD pacemaker-ischmic CM  . Heart failure Mother   . Hypertension Mother   . Diabetes Mother   . Thyroid disease Mother   .  Cardiomyopathy Father     Deceased  . Coronary artery disease Father   . Heart failure Father   . Heart failure Brother   . Hypertension Brother   . Endometriosis Sister   . Colon cancer Neg Hx   . Liver disease Neg Hx      Social History   Social History  . Marital Status: Divorced    Spouse Name: N/A  . Number of Children: 2  . Years of Education: N/A   Occupational History  .      Works in Environmental consultant   Social History Main Topics  . Smoking status: Current Every Day Smoker -- 0.50 packs/day for 21 years    Types: Cigarettes    Start date: 07/02/1983  . Smokeless tobacco: Never Used     Comment: 5 ciggs daily 11-26-12  . Alcohol Use: No  . Drug Use: No  . Sexual Activity: No   Other Topics Concern  . Not on file   Social History Narrative     BP 120/74 mmHg  Pulse 79  Ht 5' 7.5" (1.715 m)  Wt 287 lb (130.182 kg)  BMI 44.26 kg/m2  SpO2 97%  Physical Exam:  obese appearing middle aged woman, NAD HEENT: Unremarkable Neck:  No JVD, no thyromegally Back:  No CVA tenderness Lungs:  Clear with no wheezes HEART:  Regular rate rhythm, no murmurs, no rubs, no clicks Abd:  soft, positive bowel sounds, no organomegally, no rebound, no guarding Ext:  2 plus pulses, no edema, no cyanosis, no clubbing Skin:  No rashes no nodules Neuro:  CN II through XII intact, motor grossly intact   DEVICE  Normal device function.  See PaceArt  for details.   Assess/Plan:  1. Chest pressure - the etiology is unclear but a bit concerning. I will schedule a lexiscan myoview. She is encouraged to go to the ER if her symptoms return. 2. Probable TIA's - she has worrisome symptoms. I have recommended she have an ILR inserted as I am concerned about occult atrial fib 3. HTN - she is encouraged to lose weight and continue her current meds. 4. Chronic systolic heart failure - her symptoms are class 2. She will continue her current meds. I have asked her to reduce her salt intake. 5. Tobacco abuse - she is encouraged to stop smoking. 6. Her St. Jude single chamber ICD is working normally. Will follow.  Mikle Bosworth.D.

## 2015-08-13 ENCOUNTER — Encounter (HOSPITAL_COMMUNITY)
Admission: RE | Disposition: A | Discharge status: Home / Self Care | Payer: Self-pay | Source: Ambulatory Visit | Attending: Internal Medicine

## 2015-08-13 ENCOUNTER — Ambulatory Visit (HOSPITAL_COMMUNITY)
Admission: RE | Admit: 2015-08-13 | Discharge: 2015-08-13 | Disposition: A | Discharge status: Home / Self Care | Payer: Medicare Other | Source: Ambulatory Visit | Attending: Internal Medicine | Admitting: Internal Medicine

## 2015-08-13 ENCOUNTER — Encounter (HOSPITAL_COMMUNITY): Payer: Self-pay | Admitting: Internal Medicine

## 2015-08-13 DIAGNOSIS — Z882 Allergy status to sulfonamides status: Secondary | ICD-10-CM | POA: Insufficient documentation

## 2015-08-13 DIAGNOSIS — G459 Transient cerebral ischemic attack, unspecified: Secondary | ICD-10-CM | POA: Diagnosis not present

## 2015-08-13 DIAGNOSIS — Z95810 Presence of automatic (implantable) cardiac defibrillator: Secondary | ICD-10-CM | POA: Diagnosis not present

## 2015-08-13 DIAGNOSIS — I639 Cerebral infarction, unspecified: Secondary | ICD-10-CM | POA: Diagnosis not present

## 2015-08-13 DIAGNOSIS — Z8249 Family history of ischemic heart disease and other diseases of the circulatory system: Secondary | ICD-10-CM | POA: Insufficient documentation

## 2015-08-13 DIAGNOSIS — J449 Chronic obstructive pulmonary disease, unspecified: Secondary | ICD-10-CM | POA: Insufficient documentation

## 2015-08-13 DIAGNOSIS — F419 Anxiety disorder, unspecified: Secondary | ICD-10-CM | POA: Diagnosis not present

## 2015-08-13 DIAGNOSIS — I110 Hypertensive heart disease with heart failure: Secondary | ICD-10-CM | POA: Insufficient documentation

## 2015-08-13 DIAGNOSIS — Z7951 Long term (current) use of inhaled steroids: Secondary | ICD-10-CM | POA: Insufficient documentation

## 2015-08-13 DIAGNOSIS — E039 Hypothyroidism, unspecified: Secondary | ICD-10-CM | POA: Diagnosis not present

## 2015-08-13 DIAGNOSIS — K219 Gastro-esophageal reflux disease without esophagitis: Secondary | ICD-10-CM | POA: Insufficient documentation

## 2015-08-13 DIAGNOSIS — Z880 Allergy status to penicillin: Secondary | ICD-10-CM | POA: Insufficient documentation

## 2015-08-13 DIAGNOSIS — Z6841 Body Mass Index (BMI) 40.0 and over, adult: Secondary | ICD-10-CM | POA: Diagnosis not present

## 2015-08-13 DIAGNOSIS — E669 Obesity, unspecified: Secondary | ICD-10-CM | POA: Diagnosis not present

## 2015-08-13 DIAGNOSIS — G629 Polyneuropathy, unspecified: Secondary | ICD-10-CM | POA: Insufficient documentation

## 2015-08-13 DIAGNOSIS — R569 Unspecified convulsions: Secondary | ICD-10-CM | POA: Diagnosis not present

## 2015-08-13 DIAGNOSIS — J45909 Unspecified asthma, uncomplicated: Secondary | ICD-10-CM | POA: Insufficient documentation

## 2015-08-13 DIAGNOSIS — E785 Hyperlipidemia, unspecified: Secondary | ICD-10-CM | POA: Diagnosis not present

## 2015-08-13 DIAGNOSIS — Z7982 Long term (current) use of aspirin: Secondary | ICD-10-CM | POA: Diagnosis not present

## 2015-08-13 DIAGNOSIS — I255 Ischemic cardiomyopathy: Secondary | ICD-10-CM | POA: Diagnosis not present

## 2015-08-13 DIAGNOSIS — M1990 Unspecified osteoarthritis, unspecified site: Secondary | ICD-10-CM | POA: Insufficient documentation

## 2015-08-13 DIAGNOSIS — Z91041 Radiographic dye allergy status: Secondary | ICD-10-CM | POA: Insufficient documentation

## 2015-08-13 DIAGNOSIS — I5022 Chronic systolic (congestive) heart failure: Secondary | ICD-10-CM | POA: Insufficient documentation

## 2015-08-13 DIAGNOSIS — F17210 Nicotine dependence, cigarettes, uncomplicated: Secondary | ICD-10-CM | POA: Diagnosis not present

## 2015-08-13 HISTORY — PX: EP IMPLANTABLE DEVICE: SHX172B

## 2015-08-13 SURGERY — LOOP RECORDER INSERTION
Anesthesia: LOCAL

## 2015-08-13 MED ORDER — ACETAMINOPHEN 325 MG PO TABS: 325.0000 mg | ORAL_TABLET | ORAL | Status: DC | PRN

## 2015-08-13 MED ORDER — LIDOCAINE-EPINEPHRINE 1 %-1:100000 IJ SOLN
INTRAMUSCULAR | Status: AC
  Filled 2015-08-13: qty 1

## 2015-08-13 MED ORDER — ONDANSETRON HCL 4 MG/2ML IJ SOLN: 4.0000 mg | Freq: Four times a day (QID) | INTRAMUSCULAR | Status: DC | PRN

## 2015-08-13 SURGICAL SUPPLY — 2 items
LOOP REVEAL LINQSYS (Prosthesis & Implant Heart) ×1 IMPLANT
PACK LOOP INSERTION (CUSTOM PROCEDURE TRAY) ×2 IMPLANT

## 2015-08-13 NOTE — H&P (View-Only) (Signed)
HPI Melanie Cordova returns today for followup. She is a very pleasant 55 year old woman with an ischemic cardiomyopathy, chronic systolic heart failure, severe back pain, hypertension, and COPD. She denies shortness of breath. No ICD shock. No peripheral edema. She continues to smoke cigarettes but has reduced her consumption down to a half pack a day. She is trying to lose weight. She is frustrated by her inability and is seeing a dietician. Last week she was seen in the ER with neuro symptoms. No obvious stroke and her symptoms including facial droop and difficulty with speech resolved completely. A CT of head was unrevealing. She notes an episode of chest pressure which radiated to her back. It resolved after 4 hours. Allergies  Allergen Reactions  . Fish Allergy Anaphylaxis  . Iodinated Diagnostic Agents Hives and Other (See Comments)    Pulmonary problems; no frank respiratory arrest  . Iodine Hives and Other (See Comments)    Pulmonary Problems   . Lentil Anaphylaxis  . Penicillins Anaphylaxis and Shortness Of Breath    Has patient had a PCN reaction causing immediate rash, facial/tongue/throat swelling, SOB or lightheadedness with hypotension: Yes Has patient had a PCN reaction causing severe rash involving mucus membranes or skin necrosis: No Has patient had a PCN reaction that required hospitalization No Has patient had a PCN reaction occurring within the last 10 years: No If all of the above answers are "NO", then may proceed with Cephalosporin use.  Hair loss  . Lipitor [Atorvastatin] Itching and Rash  . Spironolactone Rash  . Sulfa Antibiotics Other (See Comments)    Unknown     Current Outpatient Prescriptions  Medication Sig Dispense Refill  . albuterol (PROVENTIL HFA;VENTOLIN HFA) 108 (90 BASE) MCG/ACT inhaler Inhale 2 puffs into the lungs every 6 (six) hours as needed for wheezing or shortness of breath. 1 Inhaler 5  . aspirin EC 81 MG tablet Take 81 mg by mouth daily.    .  budesonide-formoterol (SYMBICORT) 80-4.5 MCG/ACT inhaler Inhale 2 puffs into the lungs 2 (two) times daily.    . carvedilol (COREG) 12.5 MG tablet Take 25 mg by mouth 2 (two) times daily with a meal.    . clobetasol (TEMOVATE) 0.05 % external solution Apply 1 application topically 2 (two) times daily as needed. To scalp    . desloratadine (CLARINEX) 5 MG tablet Take 5 mg by mouth daily.    . diclofenac sodium (VOLTAREN) 1 % GEL Apply 2 g topically 4 (four) times daily as needed (pain).     Marland Kitchen diphenhydrAMINE (BENADRYL) 25 mg capsule Take 25 mg by mouth every 8 (eight) hours as needed for allergies.     . Ferrous Sulfate (IRON) 28 MG TABS Take 1 tablet by mouth daily.    . fluticasone (FLONASE) 50 MCG/ACT nasal spray Place into both nostrils daily.    . furosemide (LASIX) 40 MG tablet Take 1 tablet (40 mg total) by mouth 2 (two) times daily. 180 tablet 0  . gabapentin (NEURONTIN) 600 MG tablet Take 600 mg by mouth 3 (three) times daily. Patient takes 1 tablet in the morning and in the afternoon, then takes 2 tablets at bedtime    . levothyroxine (SYNTHROID, LEVOTHROID) 200 MCG tablet Take 200 mcg by mouth daily before breakfast. Takes with 77mcg for a total of 225mcg    . levothyroxine (SYNTHROID, LEVOTHROID) 25 MCG tablet Take 1 tablet (25 mcg total) by mouth daily before breakfast. Takes with 252mcg to equal 26mcg 90 tablet 1  .  lisinopril (PRINIVIL,ZESTRIL) 10 MG tablet Take 1 tablet (10 mg total) by mouth daily. 90 tablet 0  . lovastatin (MEVACOR) 40 MG tablet Take 1 tablet (40 mg total) by mouth at bedtime. 90 tablet 0  . potassium chloride SA (K-DUR,KLOR-CON) 20 MEQ tablet Take 2 tablets (40 mEq total) by mouth 2 (two) times daily. 180 tablet 0  . ranitidine (ZANTAC) 150 MG tablet Take 150 mg by mouth at bedtime.     . Vitamin D, Ergocalciferol, (DRISDOL) 50000 UNITS CAPS capsule Take 1 capsule (50,000 Units total) by mouth every 7 (seven) days. 12 capsule 1   No current facility-administered  medications for this visit.     Past Medical History  Diagnosis Date  . Hypertension     09/2010-normal CMet and CBC; Lipid profile-116, 88, 25, 73  . Cardiomyopathy 08/2010    Presented with congestive heart failure; EF of 15% and 2012; hypotension on medication precludes optimal dosing  . Gastroesophageal reflux disease   . Pneumonia   . Obesity   . Hyperlipidemia   . Tobacco abuse     20 pack years  . CHF (congestive heart failure) ([place])   . Asthma   . Shortness of breath   . Headache(784.0)   . Arthritis   . Anxiety   . Neuropathy ([place])   . 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 Chronic systolic heart failure ([place])   . Dysrhythmia   . Automatic implantable cardioverter-defibrillator in situ     2013, july  . COPD (chronic obstructive pulmonary disease) ([place])     2013  . Bell palsy     states has had 3 episodes  . Stroke [facility])      stroke in 07/2012 and another in June, 2014  . Seizures ([place])     last one at age 2  . Neuromuscular disorder (HCC)     Neuropathy, right foot and leg  . Hypothyroidism     Recent TSH was normal.    ROS:   All systems reviewed and negative except as noted in the HPI.   Past Surgical History  Procedure Laterality Date  . Cesarean section      X2  . Tee without cardioversion N/A 07/11/2012    Procedure: TRANSESOPHAGEAL ECHOCARDIOGRAM (TEE);  Surgeon: Thayer Headings, MD;  Location: [place];  Service: Cardiovascular;  Laterality: N/A;  . Tubal ligation    . Multiple extractions with alveoloplasty N/A 09/24/2012    Procedure: MULTIPLE EXTRACION #2, 4, 6, 7 ,8, 9, 11, 13, 18, 20, 21, 22, 23, 24, 25, 26, 27, 29 WITH ALVEOLOPLASTY, BIOPSY OF PALATE LESION, REMOVA RIGHT LINGUAL TORUS;  Surgeon: Gae Bon, DDS;  Location: [place];  Service: Oral Surgery;  Laterality: N/A;  . Ep implantable device      St. Jude  . Colonoscopy N/A 12/16/2013    Procedure: COLONOSCOPY;  Surgeon: Danie Binder, MD;  [facility];  Service: Endoscopy;  Laterality: N/A;  10:45-moved to 10/5 @ [place] notified pt  . Implantable cardioverter defibrillator implant N/A 10/10/2011    Procedure: IMPLANTABLE CARDIOVERTER DEFIBRILLATOR IMPLANT;  Surgeon: Evans Lance, MD;  Location: [facility] CATH LAB;  Service: Cardiovascular;  Laterality: N/A;     Family History  Problem Relation Age of Onset  . Cardiomyopathy Mother     ICD pacemaker-ischmic CM  . Heart failure Mother   . Hypertension Mother   . Diabetes Mother   . Thyroid disease Mother   .  Cardiomyopathy Father     Deceased  . Coronary artery disease Father   . Heart failure Father   . Heart failure Brother   . Hypertension Brother   . Endometriosis Sister   . Colon cancer Neg Hx   . Liver disease Neg Hx      Social History   Social History  . Marital Status: Divorced    Spouse Name: N/A  . Number of Children: 2  . Years of Education: N/A   Occupational History  .      Works in Environmental consultant   Social History Main Topics  . Smoking status: Current Every Day Smoker -- 0.50 packs/day for 21 years    Types: Cigarettes    Start date: 07/02/1983  . Smokeless tobacco: Never Used     Comment: 5 ciggs daily 11-26-12  . Alcohol Use: No  . Drug Use: No  . Sexual Activity: No   Other Topics Concern  . Not on file   Social History Narrative     BP 120/74 mmHg  Pulse 79  Ht 5' 7.5" (1.715 m)  Wt 287 lb (130.182 kg)  BMI 44.26 kg/m2  SpO2 97%  Physical Exam:  obese appearing middle aged woman, NAD HEENT: Unremarkable Neck:  No JVD, no thyromegally Back:  No CVA tenderness Lungs:  Clear with no wheezes HEART:  Regular rate rhythm, no murmurs, no rubs, no clicks Abd:  soft, positive bowel sounds, no organomegally, no rebound, no guarding Ext:  2 plus pulses, no edema, no cyanosis, no clubbing Skin:  No rashes no nodules Neuro:  CN II through XII intact, motor grossly intact   DEVICE  Normal device function.  See PaceArt  for details.   Assess/Plan:  1. Chest pressure - the etiology is unclear but a bit concerning. I will schedule a lexiscan myoview. She is encouraged to go to the ER if her symptoms return. 2. Probable TIA's - she has worrisome symptoms. I have recommended she have an ILR inserted as I am concerned about occult atrial fib 3. HTN - she is encouraged to lose weight and continue her current meds. 4. Chronic systolic heart failure - her symptoms are class 2. She will continue her current meds. I have asked her to reduce her salt intake. 5. Tobacco abuse - she is encouraged to stop smoking. 6. Her St. Jude single chamber ICD is working normally. Will follow.  Mikle Bosworth.D.

## 2015-08-13 NOTE — Interval H&P Note (Signed)
History and Physical Interval Note:  08/13/2015 7:29 AM  Melanie Cordova  has presented today for surgery, with the diagnosis of tia  The various methods of treatment have been discussed with the patient and family. After consideration of risks, benefits and other options for treatment, the patient has consented to  Procedure(s): Loop Recorder Insertion (N/A) as a surgical intervention .  The patient's history has been reviewed, patient examined, no change in status, stable for surgery.  I have reviewed the patient's chart and labs.  Questions were answered to the patient's satisfaction.     [name]

## 2015-08-13 NOTE — Discharge Instructions (Signed)
Discharge instructions given verbally and in writing.  Pt verbalizes understanding and denies further questions

## 2015-08-14 ENCOUNTER — Encounter (HOSPITAL_COMMUNITY)
Admission: RE | Admit: 2015-08-14 | Discharge: 2015-08-14 | Disposition: A | Discharge status: Home / Self Care | Payer: Medicare Other | Source: Ambulatory Visit | Attending: Internal Medicine | Admitting: Internal Medicine

## 2015-08-14 ENCOUNTER — Inpatient Hospital Stay (HOSPITAL_COMMUNITY): Admission: RE | Admit: 2015-08-14 | Payer: Medicare Other | Source: Ambulatory Visit

## 2015-08-14 ENCOUNTER — Encounter (HOSPITAL_COMMUNITY): Payer: Self-pay

## 2015-08-14 DIAGNOSIS — R079 Chest pain, unspecified: Secondary | ICD-10-CM | POA: Diagnosis not present

## 2015-08-14 DIAGNOSIS — R0789 Other chest pain: Secondary | ICD-10-CM | POA: Insufficient documentation

## 2015-08-14 DIAGNOSIS — R931 Abnormal findings on diagnostic imaging of heart and coronary circulation: Secondary | ICD-10-CM | POA: Insufficient documentation

## 2015-08-14 LAB — NM MYOCAR MULTI W/SPECT W/WALL MOTION / EF
CHL CUP NUCLEAR SRS: 3
CHL CUP RESTING HR STRESS: 70 {beats}/min
LV dias vol: 175 mL (ref 46–106)
LVSYSVOL: 114 mL
NUC STRESS TID: 1.18
Peak HR: 95 {beats}/min
RATE: 0.24
SDS: 2
SSS: 5

## 2015-08-14 MED ORDER — TECHNETIUM TC 99M TETROFOSMIN IV KIT
10.0000 | PACK | Freq: Once | INTRAVENOUS | Status: AC | PRN
  Administered 2015-08-14: 10 via INTRAVENOUS

## 2015-08-14 MED ORDER — SODIUM CHLORIDE 0.9% FLUSH
INTRAVENOUS | Status: AC
  Administered 2015-08-14: 10 mL via INTRAVENOUS
  Filled 2015-08-14: qty 10

## 2015-08-14 MED ORDER — REGADENOSON 0.4 MG/5ML IV SOLN
INTRAVENOUS | Status: AC
  Administered 2015-08-14: 0.4 mg via INTRAVENOUS
  Filled 2015-08-14: qty 5

## 2015-08-14 MED ORDER — TECHNETIUM TC 99M TETROFOSMIN IV KIT
30.0000 | PACK | Freq: Once | INTRAVENOUS | Status: AC | PRN
  Administered 2015-08-14: 30 via INTRAVENOUS

## 2015-08-14 MED FILL — Lidocaine Inj 1% w/ Epinephrine-1:100000: INTRAMUSCULAR | Qty: 20 | Status: AC

## 2015-08-18 ENCOUNTER — Telehealth: Payer: Self-pay | Admitting: Internal Medicine

## 2015-08-18 NOTE — Telephone Encounter (Signed)
Pt thought a normal EF was 100 %, I explained to her the normal rang 50-70 % and the fact she has just had a heart cath after nuclear study(her Ef was 35 %)  Pt verbalized understanding

## 2015-08-18 NOTE — Telephone Encounter (Signed)
Patient would like to speak with nurse regarding device and pumping function of her heart. Patient was just saw by Dr.Taylor on 08/07/15. / tg

## 2015-08-20 ENCOUNTER — Encounter: Payer: Self-pay | Admitting: Internal Medicine

## 2015-08-24 ENCOUNTER — Encounter: Payer: Self-pay | Admitting: Internal Medicine

## 2015-08-24 ENCOUNTER — Ambulatory Visit (INDEPENDENT_AMBULATORY_CARE_PROVIDER_SITE_OTHER): Payer: Medicare Other | Admitting: *Deleted

## 2015-08-24 DIAGNOSIS — I639 Cerebral infarction, unspecified: Secondary | ICD-10-CM

## 2015-08-24 LAB — CUP PACEART INCLINIC DEVICE CHECK
Date Time Interrogation Session: 20170612114856
MDC IDC LEAD IMPLANT DT: 20130729
MDC IDC LEAD LOCATION: 753860
MDC IDC LEAD MODEL: 181
MDC IDC LEAD SERIAL: 322336

## 2015-08-24 NOTE — Progress Notes (Signed)
ILR wound check appointment. Steri-strips removed. Wound without redness or edema. Incision edges approximated, wound well healed. Normal device function. Battery status: good. R-waves 0.25mV. No symptom, tachy, pause, brady, or AF episodes. Pause and brady detection reprogrammed off as patient was implanted for cryptogenic stroke. Monthly summary reports and ROV with GT/R in 07/2016.

## 2015-08-27 ENCOUNTER — Other Ambulatory Visit: Payer: Self-pay | Admitting: "Endocrinology

## 2015-08-27 DIAGNOSIS — E039 Hypothyroidism, unspecified: Secondary | ICD-10-CM | POA: Diagnosis not present

## 2015-08-27 LAB — T4, FREE: Free T4: 1 ng/dL (ref 0.8–1.8)

## 2015-08-27 LAB — TSH: TSH: 0.55 m[IU]/L

## 2015-08-31 DIAGNOSIS — G4459 Other complicated headache syndrome: Secondary | ICD-10-CM | POA: Diagnosis not present

## 2015-08-31 DIAGNOSIS — E039 Hypothyroidism, unspecified: Secondary | ICD-10-CM | POA: Diagnosis not present

## 2015-08-31 DIAGNOSIS — F5109 Other insomnia not due to a substance or known physiological condition: Secondary | ICD-10-CM | POA: Diagnosis not present

## 2015-08-31 DIAGNOSIS — M1990 Unspecified osteoarthritis, unspecified site: Secondary | ICD-10-CM | POA: Diagnosis not present

## 2015-08-31 DIAGNOSIS — G450 Vertebro-basilar artery syndrome: Secondary | ICD-10-CM | POA: Diagnosis not present

## 2015-09-03 ENCOUNTER — Ambulatory Visit: Payer: Medicare Other | Admitting: "Endocrinology

## 2015-09-03 ENCOUNTER — Ambulatory Visit (INDEPENDENT_AMBULATORY_CARE_PROVIDER_SITE_OTHER): Payer: Medicare Other | Admitting: "Endocrinology

## 2015-09-03 ENCOUNTER — Encounter: Payer: Self-pay | Admitting: "Endocrinology

## 2015-09-03 VITALS — BP 103/70 | HR 74 | Ht 67.5 in | Wt 284.0 lb

## 2015-09-03 DIAGNOSIS — E785 Hyperlipidemia, unspecified: Secondary | ICD-10-CM | POA: Diagnosis not present

## 2015-09-03 DIAGNOSIS — I10 Essential (primary) hypertension: Secondary | ICD-10-CM | POA: Diagnosis not present

## 2015-09-03 DIAGNOSIS — E038 Other specified hypothyroidism: Secondary | ICD-10-CM

## 2015-09-03 MED ORDER — LEVOTHYROXINE SODIUM 25 MCG PO TABS: 25.0000 ug | ORAL_TABLET | Freq: Every day | ORAL | Status: DC

## 2015-09-03 MED ORDER — LEVOTHYROXINE SODIUM 200 MCG PO TABS: 200.0000 ug | ORAL_TABLET | Freq: Every day | ORAL | Status: DC

## 2015-09-03 NOTE — Progress Notes (Signed)
Subjective:    Patient ID: Melanie Cordova, female    DOB: September 09, 1960, PCP Rosita Fire, MD   Past Medical History  Diagnosis Date  . Hypertension     09/2010-normal CMet and CBC; Lipid profile-116, 88, 25, 73  . Cardiomyopathy 08/2010    Presented with congestive heart failure; EF of 15% and 2012; hypotension on medication precludes optimal dosing  . Gastroesophageal reflux disease   . Pneumonia   . Obesity   . Hyperlipidemia   . Tobacco abuse     20 pack years  . CHF (congestive heart failure) ([place])   . Asthma   . Shortness of breath   . Headache(784.0)   . Arthritis   . Anxiety   . Neuropathy ([place])   . 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 Chronic systolic heart failure ([place])   . Dysrhythmia   . Automatic implantable cardioverter-defibrillator in situ     2013, july  . COPD (chronic obstructive pulmonary disease) ([place])     2013  . Bell palsy     states has had 3 episodes  . Stroke [facility])      stroke in 07/2012 and another in June, 2014  . Seizures ([place])     last one at age 46  . Neuromuscular disorder (HCC)     Neuropathy, right foot and leg  . Hypothyroidism     Recent TSH was normal.   Past Surgical History  Procedure Laterality Date  . Cesarean section      X2  . Tee without cardioversion N/A 07/11/2012    Procedure: TRANSESOPHAGEAL ECHOCARDIOGRAM (TEE);  Surgeon: Thayer Headings, MD;  Location: [place];  Service: Cardiovascular;  Laterality: N/A;  . Tubal ligation    . Multiple extractions with alveoloplasty N/A 09/24/2012    Procedure: MULTIPLE EXTRACION #2, 4, 6, 7 ,8, 9, 11, 13, 18, 20, 21, 22, 23, 24, 25, 26, 27, 29 WITH ALVEOLOPLASTY, BIOPSY OF PALATE LESION, REMOVA RIGHT LINGUAL TORUS;  Surgeon: Gae Bon, DDS;  Location: [place];  Service: Oral Surgery;  Laterality: N/A;  . Ep implantable device      St. Jude  . Colonoscopy N/A 12/16/2013    Procedure: COLONOSCOPY;  Surgeon: Danie Binder, MD;  [facility];  Service: Endoscopy;  Laterality: N/A;  10:45-moved to 10/5 @ [place] notified pt  . Implantable cardioverter defibrillator implant N/A 10/10/2011    Procedure: IMPLANTABLE CARDIOVERTER DEFIBRILLATOR IMPLANT;  Surgeon: Evans Lance, MD;  Location: [facility] CATH LAB;  Service: Cardiovascular;  Laterality: N/A;  . Ep implantable device N/A 08/13/2015    Procedure: Loop Recorder Insertion;  Surgeon: Evans Lance, MD;  Location: [place] CV LAB;  Service: Cardiovascular;  Laterality: N/A;   Social History   Social History  . Marital Status: Divorced    Spouse Name: N/A  . Number of Children: 2  . Years of Education: N/A   Occupational History  .      Works in Environmental consultant   Social History Main Topics  . Smoking status: Current Every Day Smoker -- 0.50 packs/day for 21 years    Types: Cigarettes    Start date: 07/02/1983  . Smokeless tobacco: Never Used     Comment: 5 ciggs daily 11-26-12  . Alcohol Use: No  . Drug Use: No  . Sexual Activity: No   Other Topics Concern  . Not on file   Social History Narrative   Outpatient  Encounter Prescriptions as of 09/03/2015  Medication Sig  . albuterol (PROVENTIL HFA;VENTOLIN HFA) 108 (90 BASE) MCG/ACT inhaler Inhale 2 puffs into the lungs every 6 (six) hours as needed for wheezing or shortness of breath.  Marland Kitchen aspirin EC 81 MG tablet Take 81 mg by mouth 2 (two) times daily.   . budesonide-formoterol (SYMBICORT) 80-4.5 MCG/ACT inhaler Inhale 2 puffs into the lungs 2 (two) times daily.  . carvedilol (COREG) 12.5 MG tablet Take 25 mg by mouth 2 (two) times daily with a meal.  . clobetasol (TEMOVATE) 0.05 % external solution Apply 1 application topically 2 (two) times daily as needed (Apply to scalp.).   Marland Kitchen desloratadine (CLARINEX) 5 MG tablet Take 5 mg by mouth daily.  . diclofenac sodium (VOLTAREN) 1 % GEL Apply 2 g topically 4 (four) times daily as needed (pain).   Marland Kitchen diphenhydrAMINE (BENADRYL) 25 mg capsule Take 25 mg by mouth  every 8 (eight) hours as needed for allergies.   . Ferrous Sulfate (IRON) 28 MG TABS Take 1 tablet by mouth daily.  . fluticasone (FLONASE) 50 MCG/ACT nasal spray Place 2 sprays into both nostrils daily as needed for allergies.   . furosemide (LASIX) 40 MG tablet Take 1 tablet (40 mg total) by mouth 2 (two) times daily.  Marland Kitchen gabapentin (NEURONTIN) 600 MG tablet Take 600 mg by mouth 3 (three) times daily. Take 1 tablet in the morning and in the afternoon, then takes 2 tablets at bedtime.  Marland Kitchen levothyroxine (SYNTHROID, LEVOTHROID) 200 MCG tablet Take 1 tablet (200 mcg total) by mouth daily before breakfast. Takes with 53mcg for a total of 227mcg  . levothyroxine (SYNTHROID, LEVOTHROID) 25 MCG tablet Take 1 tablet (25 mcg total) by mouth daily before breakfast. Takes with 27mcg to equal 291mcg  . lisinopril (PRINIVIL,ZESTRIL) 10 MG tablet Take 1 tablet (10 mg total) by mouth daily.  Marland Kitchen lovastatin (MEVACOR) 40 MG tablet Take 1 tablet (40 mg total) by mouth at bedtime.  . potassium chloride SA (K-DUR,KLOR-CON) 20 MEQ tablet Take 2 tablets (40 mEq total) by mouth 2 (two) times daily.  . ranitidine (ZANTAC) 150 MG tablet Take 150 mg by mouth at bedtime as needed for heartburn.   . Vitamin D, Ergocalciferol, (DRISDOL) 50000 UNITS CAPS capsule Take 1 capsule (50,000 Units total) by mouth every 7 (seven) days. (Patient taking differently: Take 50,000 Units by mouth every Saturday. )  . [DISCONTINUED] levothyroxine (SYNTHROID, LEVOTHROID) 200 MCG tablet Take 200 mcg by mouth daily before breakfast. Takes with 69mcg for a total of 269mcg  . [DISCONTINUED] levothyroxine (SYNTHROID, LEVOTHROID) 25 MCG tablet Take 1 tablet (25 mcg total) by mouth daily before breakfast. Takes with 262mcg to equal 218mcg   No facility-administered encounter medications on file as of 09/03/2015.   ALLERGIES: Allergies  Allergen Reactions  . Fish Allergy Anaphylaxis  . Iodinated Diagnostic Agents Hives and Other (See Comments)     Pulmonary problems; no frank respiratory arrest  . Iodine Hives and Other (See Comments)    Pulmonary Problems   . Lentil Anaphylaxis  . Penicillins Anaphylaxis and Shortness Of Breath    Has patient had a PCN reaction causing immediate rash, facial/tongue/throat swelling, SOB or lightheadedness with hypotension: Yes Has patient had a PCN reaction causing severe rash involving mucus membranes or skin necrosis: No Has patient had a PCN reaction that required hospitalization No Has patient had a PCN reaction occurring within the last 10 years: No If all of the above answers are "NO", then may proceed  with Cephalosporin use.  Hair loss  . Lipitor [Atorvastatin] Itching and Rash  . Spironolactone Rash  . Sulfa Antibiotics Other (See Comments)    Unknown   VACCINATION STATUS: Immunization History  Administered Date(s) Administered  . Pneumococcal Polysaccharide-23 09/28/2010, 11/18/2010    HPI  55 yr old female with medical hx as above. she is here to f/u for hypothyroidism. She decided not to pursue bariatric surgery. Last year, she has had 24 hour urine cortisol to r/o Cushings syndrome which was normal.  She is on LT4 225 mcg po qam. No further weight gain, she is  Not losing either. she denies palpitations, heat intolerance. she denies chest pain, SOB. She has lost 15 lbs since last year. she has family hx of thyroid dysfunction in her mother. she has AICD . she smokes 1/2 PPD ( 30 PY).  Review of Systems  Constitutional: no weight change, no fatigue, no subjective hyperthermia/hypothermia Eyes: no blurry vision, no xerophthalmia ENT: no sore throat, no nodules palpated in throat, no dysphagia/odynophagia, no hoarseness Cardiovascular: no CP/SOB/palpitations/leg swelling Respiratory: no cough/SOB Gastrointestinal: no N/V/D/C Musculoskeletal: no muscle/joint aches Skin: no rashes Neurological: no tremors/numbness/tingling/dizziness Psychiatric: no  depression/anxiety  Objective:    BP 103/70 mmHg  Pulse 74  Ht 5' 7.5" (1.715 m)  Wt 284 lb (128.822 kg)  BMI 43.80 kg/m2  SpO2 95%  Wt Readings from Last 3 Encounters:  09/03/15 284 lb (128.822 kg)  08/13/15 290 lb (131.543 kg)  08/07/15 287 lb (130.182 kg)    Physical Exam  Constitutional: obese, in NAD Eyes: PERRLA, EOMI, no exophthalmos ENT: moist mucous membranes, no thyromegaly, no cervical lymphadenopathy Cardiovascular: RRR, No MRG Respiratory: CTA B Gastrointestinal: abdomen soft, NT, ND, BS+ Musculoskeletal: no deformities, strength intact in all 4 Skin: moist, warm, no rashes Neurological: no tremor with outstretched hands, DTR normal in all 4  Diabetic Labs (most recent): Lab Results  Component Value Date   HGBA1C 5.7* 07/26/2015   HGBA1C 5.7* 04/29/2013   HGBA1C 5.6 07/08/2012       Component Value Date/Time   CHOL 206* 07/26/2015 0624   TRIG 154* 07/26/2015 0624   HDL 32* 07/26/2015 0624   CHOLHDL 6.4 07/26/2015 0624   VLDL 31 07/26/2015 0624   LDLCALC 143* 07/26/2015 0624   Assessment & Plan:   1. Hypothyroidism, unspecified hypothyroidism type -she has long standing hypothyroidism x 11 yrs. her TFTs are now on target on 225 mcg.   - We discussed about correct intake of levothyroxine, at fasting, with water, separated by at least 30 minutes from breakfast, and separated by more than 4 hours from calcium, iron, multivitamins, acid reflux medications (PPIs). -Patient is made aware of the fact that thyroid hormone replacement is needed for life, dose to be adjusted by periodic monitoring of thyroid function tests.  2. Morbid obesity due to excess calories ([place]) - She has lost 15 pounds since last year. she is given detailed carbs and exercise information. She does not have diabetes. Her 24 hour urine free cotisol is normal at 14.5, rules out Cushing's synrome.  She has seen  Jearld Fenton, CDE. Due to her legitimate concern on weight, I discussed  bariatric surgery , however, she decided not to pursue that option.  3. Essential hypertension  -I advised her to continue her medications for HTN.  4. Hyperlipidemia - continue Lovastatin 40 mg po qhs.  She is s/p  Treatment with  Vitamin D supplement.  - 25 minutes of time was spent on  the care of this patient , 50% of which was applied for counseling on prevention of  Diabetes. - I advised patient to maintain close follow up with [facility], MD for primary care needs. Follow up plan: Return in about 6 months (around 03/04/2016) for follow up with pre-visit labs.  Glade Lloyd, MD Phone: [phone]  Fax: [phone]   09/03/2015, 11:27 AM

## 2015-09-03 NOTE — Patient Instructions (Signed)

## 2015-09-04 DIAGNOSIS — R569 Unspecified convulsions: Secondary | ICD-10-CM | POA: Diagnosis not present

## 2015-09-07 ENCOUNTER — Encounter: Payer: Self-pay | Admitting: Nutrition

## 2015-09-07 ENCOUNTER — Encounter: Payer: 59 | Attending: "Endocrinology | Admitting: Nutrition

## 2015-09-07 DIAGNOSIS — R739 Hyperglycemia, unspecified: Secondary | ICD-10-CM

## 2015-09-07 DIAGNOSIS — E119 Type 2 diabetes mellitus without complications: Secondary | ICD-10-CM | POA: Insufficient documentation

## 2015-09-07 NOTE — Patient Instructions (Signed)
Plan 1. Follow Plate Method 2. Increase exercise 30-45 minutes 3 days per week. 3.INCrease fresh fruits and vegetables. 4. Continue drinking more water 5 Lose 1 lb per week.

## 2015-09-07 NOTE — Progress Notes (Signed)
  Medical Nutrition Therapy:  Appt start time: 1100  end time: 1115 Assessment:  Primary concerns today: Obesity and prediabetes follow up.  Gained 5 lbs since last visit. Has been trying to walk more when weather is nice nut not consistent enough to lose weight.. She ate high sodium foods recently. Has limited access to healthy foods due to limited food stamps. Can't get to the food pantries she reports.  Needs more fresh fruits and vegetables and increased physical activity for needed weight loss.  .  Preferred Learning Style:     No preference indicated   Learning Readiness:    Ready  Change in progress   MEDICATIONS: see list   DIETARY INTAKE:  24-hr recall:  B ( AM): Boiled egg, 1 slice toast sf jelly and water,apple L ( PM): Hamburger mustard lettuce, tomato and onion and water Snk ( PM): none D ( PM): Hot dog, mustard, chili and slaw, baked poato, water water bverages: water,   Usual physical activity: walking 30 minutes twice a week .   Estimated energy needs: 1600 calories 180 g carbohydrates 120 g protein 44 g fat  Progress Towards Goal(s):  In progress.   Nutritional Diagnosis:  NB-1.1 Food and nutrition-related knowledge deficit As related to Obesity.  As evidenced by BMI > 40.    Intervention:  Nutrition counseling on healthy weight loss tips, ways to prevent diabetes and importance of exercise. Low sodium, low fat, high fiber diet and meal planning and exercises for needed weight loss. Plan 1. Follow Plate Method 2. Increase exercise 30-45 minutes 3 days per week. 3.INCrease fresh fruits and vegetables. 4. Continue drinking more water 5 Lose 1 lb per week.  Keep food journal and bring at next visit. Continue to work on quitting smoking.  Teaching Method Utilized:  Visual Auditory Hands on  Handouts given during visit include: Healthy Weight loss tips Chair exercises  Barriers to learning/adherence to lifestyle change: none  Demonstrated  degree of understanding via:  Teach Back   Monitoring/Evaluation:  Dietary intake, exercise, meal planning, and body weight in 3 month(s).  Consider checking a  lipid profile due to cardiac risk factors.

## 2015-09-11 ENCOUNTER — Telehealth: Payer: Self-pay | Admitting: Internal Medicine

## 2015-09-11 NOTE — Telephone Encounter (Signed)
Spoke to pt. And let her  know Dr. Lovena Le said the pest repeller would not affect her device.

## 2015-09-11 NOTE — Telephone Encounter (Signed)
Patient wants to know if "ultrasonic pest repeller" for her home will affect her device. / tg

## 2015-09-11 NOTE — Telephone Encounter (Signed)
Will discuss with Dr. Lovena Le.

## 2015-09-14 ENCOUNTER — Ambulatory Visit (INDEPENDENT_AMBULATORY_CARE_PROVIDER_SITE_OTHER): Payer: Medicare Other | Admitting: *Deleted

## 2015-09-14 DIAGNOSIS — I639 Cerebral infarction, unspecified: Secondary | ICD-10-CM | POA: Diagnosis not present

## 2015-09-16 NOTE — Progress Notes (Signed)
Carelink Summary Report / Loop Recorder 

## 2015-09-28 ENCOUNTER — Other Ambulatory Visit (HOSPITAL_COMMUNITY): Payer: Self-pay | Admitting: Internal Medicine

## 2015-09-28 DIAGNOSIS — Z1231 Encounter for screening mammogram for malignant neoplasm of breast: Secondary | ICD-10-CM

## 2015-09-29 LAB — CUP PACEART REMOTE DEVICE CHECK
Implantable Lead Model: 181
MDC IDC LEAD IMPLANT DT: 20130729
MDC IDC LEAD LOCATION: 753860
MDC IDC LEAD SERIAL: 322336
MDC IDC SESS DTM: 20170701110516

## 2015-10-06 ENCOUNTER — Encounter (HOSPITAL_COMMUNITY): Payer: Self-pay | Admitting: *Deleted

## 2015-10-06 ENCOUNTER — Emergency Department (HOSPITAL_COMMUNITY)
Admission: EM | Admit: 2015-10-06 | Discharge: 2015-10-06 | Disposition: A | Discharge status: Home / Self Care | Payer: Medicare Other | Attending: Emergency Medicine | Admitting: Emergency Medicine

## 2015-10-06 ENCOUNTER — Emergency Department (HOSPITAL_COMMUNITY): Payer: Medicare Other

## 2015-10-06 DIAGNOSIS — E039 Hypothyroidism, unspecified: Secondary | ICD-10-CM | POA: Insufficient documentation

## 2015-10-06 DIAGNOSIS — R0789 Other chest pain: Secondary | ICD-10-CM | POA: Diagnosis not present

## 2015-10-06 DIAGNOSIS — J449 Chronic obstructive pulmonary disease, unspecified: Secondary | ICD-10-CM | POA: Insufficient documentation

## 2015-10-06 DIAGNOSIS — Z79899 Other long term (current) drug therapy: Secondary | ICD-10-CM | POA: Insufficient documentation

## 2015-10-06 DIAGNOSIS — I5022 Chronic systolic (congestive) heart failure: Secondary | ICD-10-CM | POA: Insufficient documentation

## 2015-10-06 DIAGNOSIS — R079 Chest pain, unspecified: Secondary | ICD-10-CM | POA: Diagnosis not present

## 2015-10-06 DIAGNOSIS — J45909 Unspecified asthma, uncomplicated: Secondary | ICD-10-CM | POA: Diagnosis not present

## 2015-10-06 DIAGNOSIS — Z8673 Personal history of transient ischemic attack (TIA), and cerebral infarction without residual deficits: Secondary | ICD-10-CM | POA: Insufficient documentation

## 2015-10-06 DIAGNOSIS — I110 Hypertensive heart disease with heart failure: Secondary | ICD-10-CM | POA: Diagnosis not present

## 2015-10-06 DIAGNOSIS — F17210 Nicotine dependence, cigarettes, uncomplicated: Secondary | ICD-10-CM | POA: Diagnosis not present

## 2015-10-06 DIAGNOSIS — M25512 Pain in left shoulder: Secondary | ICD-10-CM | POA: Diagnosis not present

## 2015-10-06 DIAGNOSIS — Z7982 Long term (current) use of aspirin: Secondary | ICD-10-CM | POA: Insufficient documentation

## 2015-10-06 DIAGNOSIS — E785 Hyperlipidemia, unspecified: Secondary | ICD-10-CM | POA: Diagnosis not present

## 2015-10-06 DIAGNOSIS — M79643 Pain in unspecified hand: Secondary | ICD-10-CM | POA: Diagnosis not present

## 2015-10-06 LAB — CBC
HCT: 42.6 % (ref 36.0–46.0)
Hemoglobin: 14 g/dL (ref 12.0–15.0)
MCH: 30.1 pg (ref 26.0–34.0)
MCHC: 32.9 g/dL (ref 30.0–36.0)
MCV: 91.6 fL (ref 78.0–100.0)
PLATELETS: 255 10*3/uL (ref 150–400)
RBC: 4.65 MIL/uL (ref 3.87–5.11)
RDW: 14 % (ref 11.5–15.5)
WBC: 9.3 10*3/uL (ref 4.0–10.5)

## 2015-10-06 LAB — BASIC METABOLIC PANEL
Anion gap: 6 (ref 5–15)
BUN: 9 mg/dL (ref 6–20)
CALCIUM: 8.9 mg/dL (ref 8.9–10.3)
CHLORIDE: 104 mmol/L (ref 101–111)
CO2: 27 mmol/L (ref 22–32)
CREATININE: 0.76 mg/dL (ref 0.44–1.00)
GFR calc non Af Amer: >60 mL/min (ref >60)
Glucose, Bld: 100 mg/dL — ABNORMAL HIGH (ref 65–99)
Potassium: 3.7 mmol/L (ref 3.5–5.1)
SODIUM: 137 mmol/L (ref 135–145)

## 2015-10-06 LAB — I-STAT TROPONIN, ED: TROPONIN I, POC: 0.02 ng/mL (ref 0.00–0.08)

## 2015-10-06 MED ORDER — CYCLOBENZAPRINE HCL 10 MG PO TABS
5.0000 mg | ORAL_TABLET | Freq: Once | ORAL | Status: AC
  Administered 2015-10-06: 5 mg via ORAL
  Filled 2015-10-06: qty 1

## 2015-10-06 MED ORDER — NAPROXEN 250 MG PO TABS
500.0000 mg | ORAL_TABLET | Freq: Once | ORAL | Status: AC
  Administered 2015-10-06: 500 mg via ORAL
  Filled 2015-10-06: qty 2

## 2015-10-06 MED ORDER — CYCLOBENZAPRINE HCL 5 MG PO TABS: 5.0000 mg | ORAL_TABLET | Freq: Three times a day (TID) | ORAL | 0 refills | Status: DC | PRN

## 2015-10-06 MED ORDER — NAPROXEN 500 MG PO TABS: ORAL_TABLET | ORAL | 0 refills | Status: DC

## 2015-10-06 NOTE — ED Notes (Signed)
Pt states chest pressure that started around 0200. Pt says pressure in her head & neck also.

## 2015-10-06 NOTE — Discharge Instructions (Signed)
Use ice and heat for comfort for your soreness in your chest wall. Take the medications as prescribed. Recheck if you get a fever, cough, struggled to breathe or seems worse.

## 2015-10-06 NOTE — ED Triage Notes (Signed)
Pt c/o chest pain that started at 2 am today; pt states she was watching TV when the pain started; pt c/o radiating pain down her left arm and into her left neck; pt took 324mg  of baby aspirin at home pta; pt describes the pain as pressure

## 2015-10-06 NOTE — ED Provider Notes (Signed)
[facility] DEPT Provider Note   CSN: [id number] Arrival date & time: 10/06/15  [id number]  First Provider Contact:  First MD Initiated Contact with Patient 10/06/15 [phone]        History   Chief Complaint Chief Complaint  Patient presents with  . Chest Pain    HPI Melanie Cordova is a 55 y.o. female.  HPI patient states yesterday she cleaned her house and didn't feel well. She states she was really tired so after she cleaned the house she laid down and took a nap. She then ate lunch. She states at 2 AM this morning she was watching TV and she started getting left-sided chest pain. She states it feels like she has bricks on her chest. She states her neck, shoulders, top of her hands are aching, and she is having a headache. She states she's had these symptoms before without the hand achiness. She states she was told to take aspirin. She denies nausea, vomiting, swelling of her legs. She states she did get sweaty and she was short of breath but that is gone now.  PCP [name]  Past Medical History:  Diagnosis Date  . Anxiety   . Arthritis   . Asthma   . Automatic implantable cardioverter-defibrillator in situ    2013, july  . Bell palsy    states has had 3 episodes  . Cardiomyopathy 08/2010   Presented with congestive heart failure; EF of 15% and 2012; hypotension on medication precludes optimal dosing  . CHF (congestive heart failure) ([place])   . Chronic systolic heart failure ([place])   . COPD (chronic obstructive pulmonary disease) ([place])    2013  . Dysrhythmia   . Gastroesophageal reflux disease   . Headache(784.0)   . Hyperlipidemia   . Hypertension    09/2010-normal CMet and CBC; Lipid profile-116, 88, 25, 73  . Hypothyroidism    Recent TSH was normal.  . 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 Neuromuscular disorder (HCC)    Neuropathy, right foot and leg  . Neuropathy ([place])   . Obesity   . Pneumonia   . Seizures ([place])    last one at age 38  .  Shortness of breath   . Stroke [facility])     stroke in 07/2012 and another in June, 2014  . Tobacco abuse    20 pack years    Patient Active Problem List   Diagnosis Date Noted  . Cryptogenic stroke ([place]) 08/13/2015  . Weakness of face muscles 07/25/2015  . Morbid obesity due to excess calories ([place]) 03/05/2015  . Rectal bleeding 11/05/2013  . Chronic systolic heart failure ([place]) 05/15/2013  . Asthma 04/29/2013  . Migraine variant 11/24/2012  . Weakness 11/15/2012  . Gingival disease 07/25/2012  . TIA (transient ischemic attack) 11/07/2011  . Automatic implantable cardioverter-defibrillator in situ 10/12/2011  . Hypokalemia 10/01/2011  . Tobacco abuse   . Hyperlipidemia   . Hypertension   . Gastroesophageal reflux disease   . Hypothyroidism   . Obesity 09/30/2010  . Cardiomyopathy ([place]) 08/13/2010    Past Surgical History:  Procedure Laterality Date  . CESAREAN SECTION     X2  . COLONOSCOPY N/A 12/16/2013   Procedure: COLONOSCOPY;  Surgeon: Danie Binder, MD;  Location: [facility];  Service: Endoscopy;  Laterality: N/A;  10:45-moved to 10/5 @ [place] notified pt  . EP IMPLANTABLE DEVICE     St. Jude  . EP IMPLANTABLE DEVICE N/A 08/13/2015  Procedure: Loop Recorder Insertion;  Surgeon: Evans Lance, MD;  Location: [facility] CV LAB;  Service: Cardiovascular;  Laterality: N/A;  . IMPLANTABLE CARDIOVERTER DEFIBRILLATOR IMPLANT N/A 10/10/2011   Procedure: IMPLANTABLE CARDIOVERTER DEFIBRILLATOR IMPLANT;  Surgeon: Evans Lance, MD;  Location: [facility] CATH LAB;  Service: Cardiovascular;  Laterality: N/A;  . MULTIPLE EXTRACTIONS WITH ALVEOLOPLASTY N/A 09/24/2012   Procedure: MULTIPLE EXTRACION #2, 4, 6, 7 ,8, 9, 11, 13, 18, 20, 21, 22, 23, 24, 25, 26, 27, 29 WITH ALVEOLOPLASTY, BIOPSY OF PALATE LESION, REMOVA RIGHT LINGUAL TORUS;  Surgeon: Gae Bon, DDS;  Location: [facility];  Service: Oral Surgery;  Laterality: N/A;  . TEE WITHOUT CARDIOVERSION N/A 07/11/2012   Procedure:  TRANSESOPHAGEAL ECHOCARDIOGRAM (TEE);  Surgeon: Thayer Headings, MD;  Location: [facility];  Service: Cardiovascular;  Laterality: N/A;  . TUBAL LIGATION      OB History    No data available       Home Medications    Prior to Admission medications   Medication Sig Start Date End Date Taking? Authorizing Provider  albuterol (PROVENTIL HFA;VENTOLIN HFA) 108 (90 BASE) MCG/ACT inhaler Inhale 2 puffs into the lungs every 6 (six) hours as needed for wheezing or shortness of breath. 07/12/12   Trinda Pascal, MD  aspirin EC 81 MG tablet Take 81 mg by mouth 2 (two) times daily.     Historical Provider, MD  budesonide-formoterol (SYMBICORT) 80-4.5 MCG/ACT inhaler Inhale 2 puffs into the lungs 2 (two) times daily.    Historical Provider, MD  carvedilol (COREG) 12.5 MG tablet Take 25 mg by mouth 2 (two) times daily with a meal.    Historical Provider, MD  clobetasol (TEMOVATE) 0.05 % external solution Apply 1 application topically 2 (two) times daily as needed (Apply to scalp.).  07/24/15   Historical Provider, MD  cyclobenzaprine (FLEXERIL) 5 MG tablet Take 1 tablet (5 mg total) by mouth 3 (three) times daily as needed. 10/06/15   Rolland Porter, MD  desloratadine (CLARINEX) 5 MG tablet Take 5 mg by mouth daily.    Historical Provider, MD  diclofenac sodium (VOLTAREN) 1 % GEL Apply 2 g topically 4 (four) times daily as needed (pain).     Historical Provider, MD  diphenhydrAMINE (BENADRYL) 25 mg capsule Take 25 mg by mouth every 8 (eight) hours as needed for allergies.     Historical Provider, MD  Ferrous Sulfate (IRON) 28 MG TABS Take 1 tablet by mouth daily.    Historical Provider, MD  fluticasone (FLONASE) 50 MCG/ACT nasal spray Place 2 sprays into both nostrils daily as needed for allergies.     Historical Provider, MD  furosemide (LASIX) 40 MG tablet Take 1 tablet (40 mg total) by mouth 2 (two) times daily. 08/29/12   Yehuda Savannah, MD  gabapentin (NEURONTIN) 600 MG tablet Take 600 mg by mouth 3  (three) times daily. Take 1 tablet in the morning and in the afternoon, then takes 2 tablets at bedtime.    Historical Provider, MD  levothyroxine (SYNTHROID, LEVOTHROID) 200 MCG tablet Take 1 tablet (200 mcg total) by mouth daily before breakfast. Takes with 62mcg for a total of 281mcg 09/03/15   [name], MD  levothyroxine (SYNTHROID, LEVOTHROID) 25 MCG tablet Take 1 tablet (25 mcg total) by mouth daily before breakfast. Takes with 269mcg to equal 239mcg 09/03/15   [name], MD  lisinopril (PRINIVIL,ZESTRIL) 10 MG tablet Take 1 tablet (10 mg total) by mouth daily. 08/29/12   Herbie Baltimore  Vilinda Blanks, MD  lovastatin (MEVACOR) 40 MG tablet Take 1 tablet (40 mg total) by mouth at bedtime. 08/29/12   Yehuda Savannah, MD  naproxen (NAPROSYN) 500 MG tablet Take 1 po BID with food prn pain 10/06/15   Rolland Porter, MD  potassium chloride SA (K-DUR,KLOR-CON) 20 MEQ tablet Take 2 tablets (40 mEq total) by mouth 2 (two) times daily. 08/29/12   Yehuda Savannah, MD  ranitidine (ZANTAC) 150 MG tablet Take 150 mg by mouth at bedtime as needed for heartburn.     Historical Provider, MD  Vitamin D, Ergocalciferol, (DRISDOL) 50000 UNITS CAPS capsule Take 1 capsule (50,000 Units total) by mouth every 7 (seven) days. Patient taking differently: Take 50,000 Units by mouth every Saturday.  03/05/15   [name], MD    Family History Family History  Problem Relation Age of Onset  . Cardiomyopathy Mother     ICD pacemaker-ischmic CM  . Heart failure Mother   . Hypertension Mother   . Diabetes Mother   . Thyroid disease Mother   . Cardiomyopathy Father     Deceased  . Coronary artery disease Father   . Heart failure Father   . Heart failure Brother   . Hypertension Brother   . Endometriosis Sister   . Colon cancer Neg Hx   . Liver disease Neg Hx     Social History Social History  Substance Use Topics  . Smoking status: Current Every Day Smoker    Packs/day: 0.50    Years: 21.00     Types: Cigarettes    Start date: 07/02/1983  . Smokeless tobacco: Never Used     Comment: 5 ciggs daily 11-26-12  . Alcohol use No   Lives at home Lives alone  Allergies   Fish allergy; Iodinated diagnostic agents; Iodine; Lentil; Penicillins; Lipitor [atorvastatin]; Spironolactone; and Sulfa antibiotics   Review of Systems Review of Systems  All other systems reviewed and are negative.    Physical Exam Updated Vital Signs BP 123/69   Pulse 65   Temp 97.6 F (36.4 C) (Oral)   Resp 14   Ht 5' 7.5" (1.715 m)   Wt 273 lb (123.8 kg)   SpO2 94%   BMI 42.13 kg/m   Physical Exam  Constitutional: She is oriented to person, place, and time. She appears well-developed and well-nourished.  Non-toxic appearance. She does not appear ill. No distress.  HENT:  Head: Normocephalic and atraumatic.  Right Ear: External ear normal.  Left Ear: External ear normal.  Nose: Nose normal. No mucosal edema or rhinorrhea.  Mouth/Throat: Oropharynx is clear and moist and mucous membranes are normal. No dental abscesses or uvula swelling.  Eyes: Conjunctivae and EOM are normal. Pupils are equal, round, and reactive to light.  Neck: Normal range of motion and full passive range of motion without pain. Neck supple.  Cardiovascular: Normal rate, regular rhythm and normal heart sounds.  Exam reveals no gallop and no friction rub.   No murmur heard. Pulmonary/Chest: Effort normal and breath sounds normal. No respiratory distress. She has no wheezes. She has no rhonchi. She has no rales. She exhibits tenderness. She exhibits no crepitus.    Patient is very tender in her left upper chest wall that reproduces her complaints of chest pain  Abdominal: Soft. Normal appearance and bowel sounds are normal. She exhibits no distension. There is no tenderness. There is no rebound and no guarding.  Musculoskeletal: Normal range of motion. She exhibits no edema or  tenderness.  Moves all extremities well.     Neurological: She is alert and oriented to person, place, and time. She has normal strength. No cranial nerve deficit.  Skin: Skin is warm, dry and intact. No rash noted. No erythema. No pallor.  Psychiatric: She has a normal mood and affect. Her speech is normal and behavior is normal. Her mood appears not anxious.  Nursing note and vitals reviewed.    ED Treatments / Results  Labs (all labs ordered are listed, but only abnormal results are displayed) Results for orders placed or performed during the hospital encounter of [id number]  Basic metabolic panel  Result Value Ref Range   Sodium 137 135 - 145 mmol/L   Potassium 3.7 3.5 - 5.1 mmol/L   Chloride 104 101 - 111 mmol/L   CO2 27 22 - 32 mmol/L   Glucose, Bld 100 (H) 65 - 99 mg/dL   BUN 9 6 - 20 mg/dL   Creatinine, Ser 0.76 0.44 - 1.00 mg/dL   Calcium 8.9 8.9 - 10.3 mg/dL   GFR calc non Af Amer >60 >60 mL/min   GFR calc Af Amer >60 >60 mL/min   Anion gap 6 5 - 15  CBC  Result Value Ref Range   WBC 9.3 4.0 - 10.5 K/uL   RBC 4.65 3.87 - 5.11 MIL/uL   Hemoglobin 14.0 12.0 - 15.0 g/dL   HCT 42.6 36.0 - 46.0 %   MCV 91.6 78.0 - 100.0 fL   MCH 30.1 26.0 - 34.0 pg   MCHC 32.9 30.0 - 36.0 g/dL   RDW 14.0 11.5 - 15.5 %   Platelets 255 150 - 400 K/uL  I-stat troponin, ED  Result Value Ref Range   Troponin i, poc 0.02 0.00 - 0.08 ng/mL   Comment 3           Laboratory interpretation all normal    EKG  EKG Interpretation  Date/Time:  Tuesday October 06 2015 04:44:46 EDT Ventricular Rate:  66 PR Interval:    QRS Duration: 127 QT Interval:  420 QTC Calculation: 440 R Axis:   -12 Text Interpretation:  Sinus rhythm Ventricular premature complex Nonspecific intraventricular conduction delay Borderline repolarization abnormality No significant change since last tracing 31 Jul 2015 Confirmed by Maeva Dant  MD-I, Issaiah Seabrooks ([id number]) on 10/06/2015 5:54:24 AM       Radiology Dg Chest 2 View  Result Date: 10/06/2015 CLINICAL DATA:  55 year old  female with centralized chest pain. EXAM: CHEST  2 VIEW COMPARISON:  Chest radiograph dated 07/25/2015 FINDINGS: Two views of the chest do not demonstrate a focal consolidation. There is no pleural effusion or pneumothorax. Mild cardiomegaly. Left pectoral AICD device. No acute osseous pathology. IMPRESSION: No active cardiopulmonary disease. Electronically Signed   By: Anner Crete M.D.   On: 10/06/2015 05:41   Procedures Procedures (including critical care time)  Medications Ordered in ED Medications  naproxen (NAPROSYN) tablet 500 mg (500 mg Oral Given 10/06/15 0616)  cyclobenzaprine (FLEXERIL) tablet 5 mg (5 mg Oral Given 10/06/15 [id number])     Initial Impression / Assessment and Plan / ED Course  I have reviewed the triage vital signs and the nursing notes.  Pertinent labs & imaging results that were available during my care of the patient were reviewed by me and considered in my medical decision making (see chart for details).  Clinical Course  Patient was given naproxen and Flexeril for her chest wall pain. We discussed her test results. At this point I do  not feel like patient is having a cardiac event or even a pulmonary event. She has very specific chest wall tenderness.  Recheck at 7:50 AM patient states her pain is improved. She was discharged home to use ice and heat for comfort, and take anti-inflammatory and  muscle relaxer for her pain.  Final Clinical Impressions(s) / ED Diagnoses   Final diagnoses:  Chest wall pain    New Prescriptions New Prescriptions   CYCLOBENZAPRINE (FLEXERIL) 5 MG TABLET    Take 1 tablet (5 mg total) by mouth 3 (three) times daily as needed.   NAPROXEN (NAPROSYN) 500 MG TABLET    Take 1 po BID with food prn pain    Plan discharge  Rolland Porter, MD, Barbette Or, MD 10/06/15 [phone]

## 2015-10-09 ENCOUNTER — Ambulatory Visit (HOSPITAL_COMMUNITY): Payer: Medicare Other

## 2015-10-12 ENCOUNTER — Ambulatory Visit (INDEPENDENT_AMBULATORY_CARE_PROVIDER_SITE_OTHER): Payer: Medicare Other | Admitting: *Deleted

## 2015-10-12 ENCOUNTER — Ambulatory Visit (HOSPITAL_COMMUNITY)
Admission: RE | Admit: 2015-10-12 | Discharge: 2015-10-12 | Disposition: A | Discharge status: Home / Self Care | Payer: Medicare Other | Source: Ambulatory Visit | Attending: Internal Medicine | Admitting: Internal Medicine

## 2015-10-12 DIAGNOSIS — I639 Cerebral infarction, unspecified: Secondary | ICD-10-CM | POA: Diagnosis not present

## 2015-10-12 DIAGNOSIS — Z1231 Encounter for screening mammogram for malignant neoplasm of breast: Secondary | ICD-10-CM | POA: Diagnosis not present

## 2015-10-12 LAB — CUP PACEART REMOTE DEVICE CHECK
Implantable Lead Serial Number: 322336
MDC IDC LEAD IMPLANT DT: 20130729
MDC IDC LEAD LOCATION: 753860
MDC IDC LEAD MODEL: 181
MDC IDC SESS DTM: 20170731120515

## 2015-10-12 NOTE — Progress Notes (Signed)
Carelink Summary Report / Loop Recorder 

## 2015-10-13 DIAGNOSIS — G43711 Chronic migraine without aura, intractable, with status migrainosus: Secondary | ICD-10-CM | POA: Diagnosis not present

## 2015-10-13 DIAGNOSIS — M1990 Unspecified osteoarthritis, unspecified site: Secondary | ICD-10-CM | POA: Diagnosis not present

## 2015-10-13 DIAGNOSIS — F5109 Other insomnia not due to a substance or known physiological condition: Secondary | ICD-10-CM | POA: Diagnosis not present

## 2015-10-13 DIAGNOSIS — L409 Psoriasis, unspecified: Secondary | ICD-10-CM | POA: Diagnosis not present

## 2015-10-13 DIAGNOSIS — M79606 Pain in leg, unspecified: Secondary | ICD-10-CM | POA: Diagnosis not present

## 2015-10-15 ENCOUNTER — Other Ambulatory Visit: Payer: Self-pay | Admitting: "Endocrinology

## 2015-10-29 ENCOUNTER — Telehealth: Payer: Self-pay | Admitting: Cardiology

## 2015-10-29 NOTE — Telephone Encounter (Signed)
Spoke w/ pt and requested that she send a manual transmission b/c her home monitor has not updated in at least 14 days.   

## 2015-11-03 DIAGNOSIS — J449 Chronic obstructive pulmonary disease, unspecified: Secondary | ICD-10-CM | POA: Diagnosis not present

## 2015-11-03 DIAGNOSIS — R739 Hyperglycemia, unspecified: Secondary | ICD-10-CM | POA: Diagnosis not present

## 2015-11-03 DIAGNOSIS — I5022 Chronic systolic (congestive) heart failure: Secondary | ICD-10-CM | POA: Diagnosis not present

## 2015-11-03 DIAGNOSIS — F17200 Nicotine dependence, unspecified, uncomplicated: Secondary | ICD-10-CM | POA: Diagnosis not present

## 2015-11-03 DIAGNOSIS — E079 Disorder of thyroid, unspecified: Secondary | ICD-10-CM | POA: Diagnosis not present

## 2015-11-03 DIAGNOSIS — Z0000 Encounter for general adult medical examination without abnormal findings: Secondary | ICD-10-CM | POA: Diagnosis not present

## 2015-11-03 DIAGNOSIS — I10 Essential (primary) hypertension: Secondary | ICD-10-CM | POA: Diagnosis not present

## 2015-11-03 DIAGNOSIS — E039 Hypothyroidism, unspecified: Secondary | ICD-10-CM | POA: Diagnosis not present

## 2015-11-03 DIAGNOSIS — E785 Hyperlipidemia, unspecified: Secondary | ICD-10-CM | POA: Diagnosis not present

## 2015-11-04 ENCOUNTER — Other Ambulatory Visit: Payer: Self-pay | Admitting: "Endocrinology

## 2015-11-10 ENCOUNTER — Emergency Department (HOSPITAL_COMMUNITY)
Admission: EM | Admit: 2015-11-10 | Discharge: 2015-11-10 | Disposition: A | Discharge status: Home / Self Care | Payer: Medicare Other | Attending: Emergency Medicine | Admitting: Emergency Medicine

## 2015-11-10 ENCOUNTER — Encounter (HOSPITAL_COMMUNITY): Payer: Self-pay | Admitting: *Deleted

## 2015-11-10 ENCOUNTER — Emergency Department (HOSPITAL_COMMUNITY): Payer: Medicare Other

## 2015-11-10 DIAGNOSIS — I5022 Chronic systolic (congestive) heart failure: Secondary | ICD-10-CM | POA: Diagnosis not present

## 2015-11-10 DIAGNOSIS — J45909 Unspecified asthma, uncomplicated: Secondary | ICD-10-CM | POA: Insufficient documentation

## 2015-11-10 DIAGNOSIS — F17210 Nicotine dependence, cigarettes, uncomplicated: Secondary | ICD-10-CM | POA: Insufficient documentation

## 2015-11-10 DIAGNOSIS — R079 Chest pain, unspecified: Secondary | ICD-10-CM

## 2015-11-10 DIAGNOSIS — Z8673 Personal history of transient ischemic attack (TIA), and cerebral infarction without residual deficits: Secondary | ICD-10-CM | POA: Insufficient documentation

## 2015-11-10 DIAGNOSIS — R071 Chest pain on breathing: Secondary | ICD-10-CM | POA: Diagnosis not present

## 2015-11-10 DIAGNOSIS — E039 Hypothyroidism, unspecified: Secondary | ICD-10-CM | POA: Diagnosis not present

## 2015-11-10 DIAGNOSIS — Z79899 Other long term (current) drug therapy: Secondary | ICD-10-CM | POA: Insufficient documentation

## 2015-11-10 DIAGNOSIS — J449 Chronic obstructive pulmonary disease, unspecified: Secondary | ICD-10-CM | POA: Diagnosis not present

## 2015-11-10 DIAGNOSIS — R0789 Other chest pain: Secondary | ICD-10-CM | POA: Insufficient documentation

## 2015-11-10 DIAGNOSIS — Z7982 Long term (current) use of aspirin: Secondary | ICD-10-CM | POA: Diagnosis not present

## 2015-11-10 DIAGNOSIS — I110 Hypertensive heart disease with heart failure: Secondary | ICD-10-CM | POA: Diagnosis not present

## 2015-11-10 LAB — BASIC METABOLIC PANEL
Anion gap: 3 — ABNORMAL LOW (ref 5–15)
BUN: 15 mg/dL (ref 6–20)
CO2: 30 mmol/L (ref 22–32)
CREATININE: 1.12 mg/dL — AB (ref 0.44–1.00)
Calcium: 8.8 mg/dL — ABNORMAL LOW (ref 8.9–10.3)
Chloride: 104 mmol/L (ref 101–111)
GFR calc Af Amer: >60 mL/min (ref >60)
GFR, EST NON AFRICAN AMERICAN: 55 mL/min — AB (ref >60)
GLUCOSE: 99 mg/dL (ref 65–99)
POTASSIUM: 3.7 mmol/L (ref 3.5–5.1)
SODIUM: 137 mmol/L (ref 135–145)

## 2015-11-10 LAB — CBC
HEMATOCRIT: 44.4 % (ref 36.0–46.0)
Hemoglobin: 14.4 g/dL (ref 12.0–15.0)
MCH: 29.6 pg (ref 26.0–34.0)
MCHC: 32.4 g/dL (ref 30.0–36.0)
MCV: 91.2 fL (ref 78.0–100.0)
PLATELETS: 267 10*3/uL (ref 150–400)
RBC: 4.87 MIL/uL (ref 3.87–5.11)
RDW: 13.9 % (ref 11.5–15.5)
WBC: 11.2 10*3/uL — ABNORMAL HIGH (ref 4.0–10.5)

## 2015-11-10 LAB — TROPONIN I: Troponin I: <0.03 ng/mL (ref <0.03)

## 2015-11-10 MED ORDER — TRAMADOL HCL 50 MG PO TABS: 50.0000 mg | ORAL_TABLET | Freq: Four times a day (QID) | ORAL | 0 refills | Status: DC | PRN

## 2015-11-10 MED ORDER — KETOROLAC TROMETHAMINE 30 MG/ML IJ SOLN
30.0000 mg | Freq: Once | INTRAMUSCULAR | Status: AC
  Administered 2015-11-10: 30 mg via INTRAVENOUS
  Filled 2015-11-10: qty 1

## 2015-11-10 MED ORDER — HYDROCODONE-ACETAMINOPHEN 5-325 MG PO TABS
1.0000 | ORAL_TABLET | Freq: Once | ORAL | Status: AC
  Administered 2015-11-10: 1 via ORAL
  Filled 2015-11-10: qty 1

## 2015-11-10 NOTE — ED Triage Notes (Signed)
Pt comes in by EMS for chest pain that is reproduced with palpation.  This pain started today around 4:30pm. Pt took 325 ASA before coming here today.

## 2015-11-10 NOTE — Discharge Instructions (Signed)
Use tramadol for pain.  Avoid excessive use of your arm.  Recheck routinely with your primary care physician.  Return to ER with any worsening pain, shortness of breath, fever, cough, or other new or worsening symptoms.

## 2015-11-10 NOTE — ED Notes (Signed)
MD at bedside. 

## 2015-11-10 NOTE — ED Provider Notes (Signed)
[facility] DEPT Provider Note   CSN: [id number] Arrival date & time: 11/10/15  1721     History   Chief Complaint Chief Complaint  Patient presents with  . Chest Pain    HPI Melanie Cordova is a 55 y.o. female. She presents for valuation of left anterior chest pain. Patient states that she noticed it about an hour before her arrival here. She was at home and went to go from a sitting to standing position. She pushed up from a sitting position she felt pain in her left chest. States it continued. It feels sharp. It still hurts to move. A limited pain to breathe, but does not feel short of breath. No recent fevers or chills.  Vision is significant past medical history with her heart including congestive heart failure baseline EF less than 25 and a pacer defibrillator.  Her most recent testing on her heart includes a Myoview this year that showed fixed deficits but no ischemia. Has not had a recent exertional symptoms. She admits she is rather sedentary.  HPI  Past Medical History:  Diagnosis Date  . Anxiety   . Arthritis   . Asthma   . Automatic implantable cardioverter-defibrillator in situ    2013, july  . Bell palsy    states has had 3 episodes  . Cardiomyopathy 08/2010   Presented with congestive heart failure; EF of 15% and 2012; hypotension on medication precludes optimal dosing  . CHF (congestive heart failure) ([place])   . Chronic systolic heart failure ([place])   . COPD (chronic obstructive pulmonary disease) ([place])    2013  . Dysrhythmia   . Gastroesophageal reflux disease   . Headache(784.0)   . Hyperlipidemia   . Hypertension    09/2010-normal CMet and CBC; Lipid profile-116, 88, 25, 73  . Hypothyroidism    Recent TSH was normal.  . 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 Neuromuscular disorder (HCC)    Neuropathy, right foot and leg  . Neuropathy ([place])   . Obesity   . Pneumonia   . Seizures ([place])    last one at age 55  . Shortness  of breath   . Stroke [name])     stroke in 07/2012 and another in June, 2014  . Tobacco abuse    20 pack years    Patient Active Problem List   Diagnosis Date Noted  . Cryptogenic stroke ([place]) 08/13/2015  . Weakness of face muscles 07/25/2015  . Morbid obesity due to excess calories ([place]) 03/05/2015  . Rectal bleeding 11/05/2013  . Chronic systolic heart failure ([place]) 05/15/2013  . Asthma 04/29/2013  . Migraine variant 11/24/2012  . Weakness 11/15/2012  . Gingival disease 07/25/2012  . TIA (transient ischemic attack) 11/07/2011  . Automatic implantable cardioverter-defibrillator in situ 10/12/2011  . Hypokalemia 10/01/2011  . Tobacco abuse   . Hyperlipidemia   . Hypertension   . Gastroesophageal reflux disease   . Hypothyroidism   . Obesity 09/30/2010  . Cardiomyopathy ([place]) 08/13/2010    Past Surgical History:  Procedure Laterality Date  . CESAREAN SECTION     X2  . COLONOSCOPY N/A 12/16/2013   Procedure: COLONOSCOPY;  Surgeon: Danie Binder, MD;  Location: [facility];  Service: Endoscopy;  Laterality: N/A;  10:45-moved to 10/5 @ [place] notified pt  . EP IMPLANTABLE DEVICE     [employer] Jude  . EP IMPLANTABLE DEVICE N/A 08/13/2015   Procedure: Loop Recorder Insertion;  Surgeon: Evans Lance, MD;  Location: [facility] LAB;  Service: Cardiovascular;  Laterality: N/A;  . IMPLANTABLE CARDIOVERTER DEFIBRILLATOR IMPLANT N/A 10/10/2011   Procedure: IMPLANTABLE CARDIOVERTER DEFIBRILLATOR IMPLANT;  Surgeon: Evans Lance, MD;  Location: [facility] CATH LAB;  Service: Cardiovascular;  Laterality: N/A;  . MULTIPLE EXTRACTIONS WITH ALVEOLOPLASTY N/A 09/24/2012   Procedure: MULTIPLE EXTRACION #2, 4, 6, 7 ,8, 9, 11, 13, 18, 20, 21, 22, 23, 24, 25, 26, 27, 29 WITH ALVEOLOPLASTY, BIOPSY OF PALATE LESION, REMOVA RIGHT LINGUAL TORUS;  Surgeon: Gae Bon, DDS;  Location: [place];  Service: Oral Surgery;  Laterality: N/A;  . TEE WITHOUT CARDIOVERSION N/A 07/11/2012   Procedure:  TRANSESOPHAGEAL ECHOCARDIOGRAM (TEE);  Surgeon: Thayer Headings, MD;  Location: [place];  Service: Cardiovascular;  Laterality: N/A;  . TUBAL LIGATION      OB History    No data available       Home Medications    Prior to Admission medications   Medication Sig Start Date End Date Taking? Authorizing Provider  albuterol (PROVENTIL HFA;VENTOLIN HFA) 108 (90 BASE) MCG/ACT inhaler Inhale 2 puffs into the lungs every 6 (six) hours as needed for wheezing or shortness of breath. 07/12/12  Yes Trinda Pascal, MD  aspirin EC 81 MG tablet Take 81 mg by mouth 2 (two) times daily.    Yes Historical Provider, MD  budesonide-formoterol (SYMBICORT) 80-4.5 MCG/ACT inhaler Inhale 2 puffs into the lungs 2 (two) times daily.   Yes Historical Provider, MD  carvedilol (COREG) 12.5 MG tablet Take 25 mg by mouth 2 (two) times daily with a meal.   Yes Historical Provider, MD  cetirizine (ZYRTEC) 10 MG tablet Take 10 mg by mouth daily.   Yes Historical Provider, MD  clobetasol (TEMOVATE) 0.05 % external solution Apply 1 application topically 2 (two) times daily as needed (Apply to scalp.).  07/24/15  Yes Historical Provider, MD  diclofenac sodium (VOLTAREN) 1 % GEL Apply 2 g topically 4 (four) times daily as needed (pain).    Yes Historical Provider, MD  diphenhydrAMINE (BENADRYL) 25 mg capsule Take 25 mg by mouth every 8 (eight) hours as needed for allergies.    Yes Historical Provider, MD  Ferrous Sulfate (IRON) 28 MG TABS Take 1 tablet by mouth daily.   Yes Historical Provider, MD  fluticasone (CUTIVATE) 0.05 % cream Apply 1 application topically 2 (two) times daily.  10/21/15  Yes Historical Provider, MD  fluticasone (FLONASE) 50 MCG/ACT nasal spray Place 2 sprays into both nostrils daily as needed for allergies.    Yes Historical Provider, MD  furosemide (LASIX) 40 MG tablet Take 1 tablet (40 mg total) by mouth 2 (two) times daily. 08/29/12  Yes Yehuda Savannah, MD  gabapentin (NEURONTIN) 800 MG tablet  Take 800 mg by mouth 4 (four) times daily. 10/30/15  Yes Historical Provider, MD  ketoconazole (NIZORAL) 2 % cream Apply 1 application topically 2 (two) times daily.  10/21/15  Yes Historical Provider, MD  levothyroxine (SYNTHROID, LEVOTHROID) 200 MCG tablet Take 1 tablet (200 mcg total) by mouth daily before breakfast. Takes with 26mcg for a total of 229mcg 09/03/15  Yes [name], MD  levothyroxine (SYNTHROID, LEVOTHROID) 25 MCG tablet Take 1 tablet (25 mcg total) by mouth daily before breakfast. Takes with 24mcg to equal 220mcg 09/03/15  Yes [name], MD  lisinopril (PRINIVIL,ZESTRIL) 10 MG tablet Take 1 tablet (10 mg total) by mouth daily. 08/29/12  Yes Yehuda Savannah, MD  potassium chloride SA (K-DUR,KLOR-CON) 20 MEQ tablet  Take 2 tablets (40 mEq total) by mouth 2 (two) times daily. 08/29/12  Yes Yehuda Savannah, MD  ranitidine (ZANTAC) 150 MG tablet Take 150 mg by mouth at bedtime.   Yes Historical Provider, MD  Topiramate ER (TROKENDI XR) 25 MG CP24 Take 1 capsule by mouth at bedtime.   Yes Historical Provider, MD  Vitamin D, Ergocalciferol, (DRISDOL) 50000 units CAPS capsule TAKE 1 CAPSULE BY MOUTH EVERY 7 DAYS 11/04/15  Yes [name], MD  cyclobenzaprine (FLEXERIL) 5 MG tablet Take 1 tablet (5 mg total) by mouth 3 (three) times daily as needed. Patient not taking: Reported on 11/10/2015 10/06/15   Rolland Porter, MD  traMADol (ULTRAM) 50 MG tablet Take 1 tablet (50 mg total) by mouth every 6 (six) hours as needed. 11/10/15   Tanna Furry, MD    Family History Family History  Problem Relation Age of Onset  . Cardiomyopathy Mother     ICD pacemaker-ischmic CM  . Heart failure Mother   . Hypertension Mother   . Diabetes Mother   . Thyroid disease Mother   . Cardiomyopathy Father     Deceased  . Coronary artery disease Father   . Heart failure Father   . Heart failure Brother   . Hypertension Brother   . Endometriosis Sister   . Colon cancer Neg Hx   . Liver  disease Neg Hx     Social History Social History  Substance Use Topics  . Smoking status: Current Every Day Smoker    Packs/day: 0.50    Years: 21.00    Types: Cigarettes    Start date: 07/02/1983  . Smokeless tobacco: Never Used     Comment: 5 ciggs daily 11-26-12  . Alcohol use No     Allergies   Fish allergy; Iodinated diagnostic agents; Iodine; Lentil; Penicillins; Lipitor [atorvastatin]; Spironolactone; and Sulfa antibiotics   Review of Systems Review of Systems  Constitutional: Negative for appetite change, chills, diaphoresis, fatigue and fever.  HENT: Negative for mouth sores, sore throat and trouble swallowing.   Eyes: Negative for visual disturbance.  Respiratory: Negative for cough, chest tightness, shortness of breath and wheezing.   Cardiovascular: Positive for chest pain.  Gastrointestinal: Negative for abdominal distention, abdominal pain, diarrhea, nausea and vomiting.  Endocrine: Negative for polydipsia, polyphagia and polyuria.  Genitourinary: Negative for dysuria, frequency and hematuria.  Musculoskeletal: Negative for gait problem.  Skin: Negative for color change, pallor and rash.  Neurological: Negative for dizziness, syncope, light-headedness and headaches.  Hematological: Does not bruise/bleed easily.  Psychiatric/Behavioral: Negative for behavioral problems and confusion.     Physical Exam Updated Vital Signs BP 95/60   Pulse 80   Temp 98.5 F (36.9 C) (Oral)   Resp 16   Ht 5\' 7"  (1.702 m)   Wt 267 lb (121.1 kg)   SpO2 97%   BMI 41.82 kg/m   Physical Exam  Constitutional: She is oriented to person, place, and time. She appears well-developed and well-nourished. No distress.  HENT:  Head: Normocephalic.  Eyes: Conjunctivae are normal. Pupils are equal, round, and reactive to light. No scleral icterus.  Neck: Normal range of motion. Neck supple. No thyromegaly present.  Cardiovascular: Normal rate and regular rhythm.  Exam reveals no  gallop and no friction rub.   No murmur heard. Tenderness and left anterior chest. Reproducible pectoral maneuvers. Repeat for palpitations. Clear lungs. Normal heart tones.  Pulmonary/Chest: Effort normal and breath sounds normal. No respiratory distress. She has no wheezes. She  has no rales.  Abdominal: Soft. Bowel sounds are normal. She exhibits no distension. There is no tenderness. There is no rebound.  Musculoskeletal: Normal range of motion.  Neurological: She is alert and oriented to person, place, and time.  Skin: Skin is warm and dry. No rash noted.  Psychiatric: She has a normal mood and affect. Her behavior is normal.     ED Treatments / Results  Labs (all labs ordered are listed, but only abnormal results are displayed) Labs Reviewed  BASIC METABOLIC PANEL - Abnormal; Notable for the following:       Result Value   Creatinine, Ser 1.12 (*)    Calcium 8.8 (*)    GFR calc non Af Amer 55 (*)    Anion gap 3 (*)    All other components within normal limits  CBC - Abnormal; Notable for the following:    WBC 11.2 (*)    All other components within normal limits  TROPONIN I  TROPONIN I    EKG  EKG Interpretation None       Radiology Dg Chest 2 View  Result Date: 11/10/2015 CLINICAL DATA:  Mid chest pain goes to more left since 4 p.m. today. EXAM: CHEST  2 VIEW COMPARISON:  10/06/2015. FINDINGS: Lungs are hyperexpanded. The lungs are clear wiithout focal pneumonia, edema, pneumothorax or pleural effusion. Interstitial markings are diffusely coarsened with chronic features. Left-sided single lead AICD remains in place. The visualized bony structures of the thorax are intact. Telemetry leads overlie the chest. IMPRESSION: No active cardiopulmonary disease. Electronically Signed   By: Misty Stanley M.D.   On: 11/10/2015 17:54    Procedures Procedures (including critical care time)  Medications Ordered in ED Medications  HYDROcodone-acetaminophen (NORCO/VICODIN) 5-325  MG per tablet 1 tablet (1 tablet Oral Given 11/10/15 1840)  ketorolac (TORADOL) 30 MG/ML injection 30 mg (30 mg Intravenous Given 11/10/15 1840)     Initial Impression / Assessment and Plan / ED Course  I have reviewed the triage vital signs and the nursing notes.  Pertinent labs & imaging results that were available during my care of the patient were reviewed by me and considered in my medical decision making (see chart for details).  Clinical Course    EKG is unchanged. First troponin normal. Given Toradol, and she will Vicodin. Pain resolves. Will not plan additional NSAIDs because her history of CHF.  Final Clinical Impressions(s) / ED Diagnoses   Final diagnoses:  Chest pain, unspecified chest pain type    C troponin normal. Plan will be home, tramadol, recheck worsening symptoms. Routine follow-up. Avoid use of the left upper extremity which may exacerbate pain.  New Prescriptions New Prescriptions   TRAMADOL (ULTRAM) 50 MG TABLET    Take 1 tablet (50 mg total) by mouth every 6 (six) hours as needed.     Tanna Furry, MD 11/10/15 2124

## 2015-11-11 ENCOUNTER — Ambulatory Visit (INDEPENDENT_AMBULATORY_CARE_PROVIDER_SITE_OTHER): Payer: Medicare Other | Admitting: *Deleted

## 2015-11-11 DIAGNOSIS — I429 Cardiomyopathy, unspecified: Secondary | ICD-10-CM

## 2015-11-11 DIAGNOSIS — I5022 Chronic systolic (congestive) heart failure: Secondary | ICD-10-CM

## 2015-11-11 DIAGNOSIS — I639 Cerebral infarction, unspecified: Secondary | ICD-10-CM

## 2015-11-11 NOTE — Progress Notes (Signed)
Carelink Summary Report / Loop Recorder 

## 2015-11-11 NOTE — Progress Notes (Signed)
Remote ICD transmission.   

## 2015-11-13 ENCOUNTER — Encounter: Payer: Self-pay | Admitting: Cardiology

## 2015-11-27 ENCOUNTER — Encounter: Payer: Self-pay | Admitting: Cardiology

## 2015-11-27 LAB — CUP PACEART REMOTE DEVICE CHECK
Brady Statistic RV Percent Paced: 1 % — CL
Implantable Lead Model: 181
Implantable Lead Serial Number: 322336
Lead Channel Impedance Value: 710 Ohm
Lead Channel Pacing Threshold Amplitude: 0.75 V
Lead Channel Pacing Threshold Pulse Width: 0.5 ms
Lead Channel Sensing Intrinsic Amplitude: 12 mV
MDC IDC LEAD IMPLANT DT: 20130729
MDC IDC LEAD LOCATION: 753860
MDC IDC SESS DTM: 20170915142227
Pulse Gen Serial Number: 1022442

## 2015-11-27 LAB — CUP PACEART INCLINIC DEVICE CHECK
Date Time Interrogation Session: 20170915141943
Implantable Lead Implant Date: 20130729
Implantable Lead Location: 753860
Lead Channel Impedance Value: 710 Ohm
Lead Channel Pacing Threshold Pulse Width: 0.5 ms
MDC IDC LEAD MODEL: 181
MDC IDC LEAD SERIAL: 322336
MDC IDC MSMT LEADCHNL RV PACING THRESHOLD AMPLITUDE: 0.75 V
MDC IDC MSMT LEADCHNL RV SENSING INTR AMPL: 12 mV
MDC IDC STAT BRADY RV PERCENT PACED: 1 % — AB
Pulse Gen Serial Number: 1022442

## 2015-12-05 LAB — CUP PACEART REMOTE DEVICE CHECK
Implantable Lead Implant Date: 20130729
Implantable Lead Location: 753860
MDC IDC LEAD MODEL: 181
MDC IDC LEAD SERIAL: 322336
MDC IDC SESS DTM: 20170830120556

## 2015-12-05 NOTE — Progress Notes (Signed)
Carelink summary report received. Battery status OK. Normal device function. No new symptom episodes, tachy episodes, brady, or pause episodes. No new AF episodes. Monthly summary reports and ROV/PRN 

## 2015-12-07 ENCOUNTER — Encounter: Payer: Self-pay | Admitting: Nutrition

## 2015-12-07 ENCOUNTER — Encounter: Payer: Medicaid Other | Attending: "Endocrinology | Admitting: Nutrition

## 2015-12-07 DIAGNOSIS — R739 Hyperglycemia, unspecified: Secondary | ICD-10-CM

## 2015-12-07 NOTE — Progress Notes (Signed)
  Medical Nutrition Therapy:  Appt start time: 1100  end time: 1115 Assessment:  Primary concerns today: Obesity and prediabetes follow up.  Lost 10 lbs since June 2017. Reduced Syndroid down to 200 mg from 225 mg/dl. Sees Dr. [name] for thyroid. PCP Dr. Legrand Rams.  Increased fresh fruits and more vegetables. Drinking water and trying to exercise 30 minutes every other day  Lab Results  Component Value Date   HGBA1C 5.7 (H) 07/26/2015   Wt Readings from Last 3 Encounters:  12/07/15 274 lb (124.3 kg)  11/10/15 267 lb (121.1 kg)  10/06/15 273 lb (123.8 kg)   Ht Readings from Last 3 Encounters:  12/07/15 5' 7.5" (1.715 m)  11/10/15 5\' 7"  (1.702 m)  10/06/15 5' 7.5" (1.715 m)   Body mass index is 42.28 kg/m. Marland Kitchen  Preferred Learning Style:     No preference indicated   Learning Readiness:    Ready  Change in progress   MEDICATIONS: see list   DIETARY INTAKE:  24-hr recall:  B ( AM): Boiled egg, oatmeal and fruit, coffee L ( PM):  Grilled cheese sandwich and tomato soup, water and fruit, Snk ( PM): cranberries, dried D ( PM): Wild rice, green beans, Grilled chicken,water, and grapes- water bverages: water,   Usual physical activity: walking 30 minutes twice a week .   Estimated energy needs: 1600 calories 180 g carbohydrates 120 g protein 44 g fat  Progress Towards Goal(s):  In progress.   Nutritional Diagnosis:  NB-1.1 Food and nutrition-related knowledge deficit As related to Obesity.  As evidenced by BMI > 40.    Intervention:  Nutrition counseling on healthy weight loss tips, ways to prevent diabetes and importance of exercise. Low sodium, low fat, high fiber diet and meal planning and exercises for needed weight loss. Goals 1. Lose 10 lbs in three months 2. Continue to drink gallon of water per day 3. Increase 45 minutes 4 days a week. 4. Increase fiber rich foods 5. Quit smoking. Dont use No Salt only Mrs Deliah Boston and other non salt substitutes.  Keep food  journal and bring at next visit. Continue to work on quitting smoking.  Teaching Method Utilized:  Visual Auditory Hands on  Handouts given during visit include: Healthy Weight loss tips Chair exercises  Barriers to learning/adherence to lifestyle change: none  Demonstrated degree of understanding via:  Teach Back   Monitoring/Evaluation:  Dietary intake, exercise, meal planning, and body weight in 3 month(s).  Consider checking a  lipid profile due to cardiac risk factors.

## 2015-12-07 NOTE — Patient Instructions (Addendum)
Goals 1. Lose 10 lbs in three months 2. Continue to drink gallon of water per day 3. Increase 45 minutes 4 days a week. 4. Increase fiber rich foods 5. Quit smoking. Dont use No Salt only Mrs Melanie Cordova and other non salt substitutes.

## 2015-12-11 ENCOUNTER — Ambulatory Visit (INDEPENDENT_AMBULATORY_CARE_PROVIDER_SITE_OTHER): Payer: Medicare Other | Admitting: *Deleted

## 2015-12-11 DIAGNOSIS — I639 Cerebral infarction, unspecified: Secondary | ICD-10-CM

## 2015-12-11 NOTE — Progress Notes (Signed)
Carelink Summary Report / Loop Recorder 

## 2016-01-11 ENCOUNTER — Ambulatory Visit (INDEPENDENT_AMBULATORY_CARE_PROVIDER_SITE_OTHER): Payer: Medicare Other | Admitting: *Deleted

## 2016-01-11 DIAGNOSIS — I639 Cerebral infarction, unspecified: Secondary | ICD-10-CM

## 2016-01-12 NOTE — Progress Notes (Signed)
Carelink Summary Report / Loop Recorder 

## 2016-01-22 LAB — CUP PACEART REMOTE DEVICE CHECK
Date Time Interrogation Session: 20170929123551
Implantable Lead Implant Date: 20130729
Implantable Lead Location: 753860
Implantable Lead Model: 181
Implantable Lead Serial Number: 322336
Implantable Pulse Generator Implant Date: 20170601

## 2016-01-22 NOTE — Progress Notes (Signed)
Carelink summary report received. Battery status OK. Normal device function. No new symptom episodes, tachy episodes, brady, or pause episodes. No new AF episodes. Monthly summary reports and ROV/PRN 

## 2016-02-09 ENCOUNTER — Ambulatory Visit (INDEPENDENT_AMBULATORY_CARE_PROVIDER_SITE_OTHER): Payer: Medicare Other | Admitting: *Deleted

## 2016-02-09 DIAGNOSIS — I639 Cerebral infarction, unspecified: Secondary | ICD-10-CM | POA: Diagnosis not present

## 2016-02-10 ENCOUNTER — Telehealth: Payer: Self-pay | Admitting: Cardiology

## 2016-02-10 ENCOUNTER — Encounter: Payer: Medicare Other | Admitting: *Deleted

## 2016-02-10 NOTE — Progress Notes (Signed)
Carelink Summary Report / Loop Recorder 

## 2016-02-10 NOTE — Telephone Encounter (Signed)
Spoke with pt and reminded pt of remote transmission that is due today. Pt verbalized understanding.   

## 2016-02-12 ENCOUNTER — Encounter: Payer: Self-pay | Admitting: Cardiology

## 2016-02-17 LAB — CUP PACEART REMOTE DEVICE CHECK
Date Time Interrogation Session: 20171029143550
Implantable Lead Location: 753860
Implantable Lead Model: 181
MDC IDC LEAD IMPLANT DT: 20130729
MDC IDC LEAD SERIAL: 322336
MDC IDC PG IMPLANT DT: 20170601

## 2016-02-17 NOTE — Progress Notes (Signed)
Carelink summary report received. Battery status OK. Normal device function. No new symptom episodes, tachy episodes, brady, or pause episodes. No new AF episodes. Monthly summary reports and ROV/PRN 

## 2016-02-19 ENCOUNTER — Other Ambulatory Visit: Payer: Self-pay | Admitting: "Endocrinology

## 2016-02-19 LAB — COMPLETE METABOLIC PANEL WITH GFR
ALBUMIN: 3.9 g/dL (ref 3.6–5.1)
ALT: 11 U/L (ref 6–29)
AST: 11 U/L (ref 10–35)
Alkaline Phosphatase: 85 U/L (ref 33–130)
BILIRUBIN TOTAL: 0.4 mg/dL (ref 0.2–1.2)
BUN: 9 mg/dL (ref 7–25)
CO2: 30 mmol/L (ref 20–31)
CREATININE: 0.78 mg/dL (ref 0.50–1.05)
Calcium: 9.2 mg/dL (ref 8.6–10.4)
Chloride: 101 mmol/L (ref 98–110)
GFR, EST NON AFRICAN AMERICAN: 86 mL/min (ref >=60)
GFR, Est African American: >89 mL/min (ref >=60)
GLUCOSE: 83 mg/dL (ref 65–99)
Potassium: 4.3 mmol/L (ref 3.5–5.3)
SODIUM: 139 mmol/L (ref 135–146)
TOTAL PROTEIN: 6.6 g/dL (ref 6.1–8.1)

## 2016-02-19 LAB — T4, FREE: FREE T4: 1.6 ng/dL (ref 0.8–1.8)

## 2016-02-19 LAB — LIPID PANEL
Cholesterol: 166 mg/dL (ref <200)
HDL: 38 mg/dL — AB (ref >50)
LDL CALC: 85 mg/dL (ref <100)
Total CHOL/HDL Ratio: 4.4 Ratio (ref <5.0)
Triglycerides: 217 mg/dL — ABNORMAL HIGH (ref <150)
VLDL: 43 mg/dL — ABNORMAL HIGH (ref <30)

## 2016-02-19 LAB — TSH: TSH: 0.09 m[IU]/L — AB

## 2016-02-20 LAB — VITAMIN D 25 HYDROXY (VIT D DEFICIENCY, FRACTURES): Vit D, 25-Hydroxy: 28 ng/mL — ABNORMAL LOW (ref 30–100)

## 2016-02-26 ENCOUNTER — Encounter: Payer: Self-pay | Admitting: "Endocrinology

## 2016-02-26 ENCOUNTER — Ambulatory Visit (INDEPENDENT_AMBULATORY_CARE_PROVIDER_SITE_OTHER): Payer: Medicare Other | Admitting: "Endocrinology

## 2016-02-26 VITALS — BP 120/74 | HR 83 | Ht 67.5 in | Wt 278.0 lb

## 2016-02-26 DIAGNOSIS — E038 Other specified hypothyroidism: Secondary | ICD-10-CM | POA: Diagnosis not present

## 2016-02-26 DIAGNOSIS — E782 Mixed hyperlipidemia: Secondary | ICD-10-CM | POA: Diagnosis not present

## 2016-02-26 DIAGNOSIS — Z720 Tobacco use: Secondary | ICD-10-CM

## 2016-02-26 DIAGNOSIS — E559 Vitamin D deficiency, unspecified: Secondary | ICD-10-CM | POA: Diagnosis not present

## 2016-02-26 NOTE — Progress Notes (Signed)
Subjective:    Patient ID: Melanie Cordova, female    DOB: Jul 18, 1960, PCP Rosita Fire, MD   Past Medical History:  Diagnosis Date  . Anxiety   . Arthritis   . Asthma   . Automatic implantable cardioverter-defibrillator in situ    2013, july  . Bell palsy    states has had 3 episodes  . Cardiomyopathy 08/2010   Presented with congestive heart failure; EF of 15% and 2012; hypotension on medication precludes optimal dosing  . CHF (congestive heart failure) ([place])   . Chronic systolic heart failure ([place])   . COPD (chronic obstructive pulmonary disease) ([place])    2013  . Dysrhythmia   . Gastroesophageal reflux disease   . Headache(784.0)   . Hyperlipidemia   . Hypertension    09/2010-normal CMet and CBC; Lipid profile-116, 88, 25, 73  . Hypothyroidism    Recent TSH was normal.  . 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 Neuromuscular disorder (HCC)    Neuropathy, right foot and leg  . Neuropathy ([place])   . Obesity   . Pneumonia   . Seizures ([place])    last one at age 62  . Shortness of breath   . Stroke [facility])     stroke in 07/2012 and another in June, 2014  . Tobacco abuse    20 pack years   Past Surgical History:  Procedure Laterality Date  . CESAREAN SECTION     X2  . COLONOSCOPY N/A 12/16/2013   Procedure: COLONOSCOPY;  Surgeon: Danie Binder, MD;  Location: [facility];  Service: Endoscopy;  Laterality: N/A;  10:45-moved to 10/5 @ [place] notified pt  . EP IMPLANTABLE DEVICE     St[employer] Jude  . EP IMPLANTABLE DEVICE N/A 08/13/2015   Procedure: Loop Recorder Insertion;  Surgeon: Evans Lance, MD;  Location: [place] CV LAB;  Service: Cardiovascular;  Laterality: N/A;  . IMPLANTABLE CARDIOVERTER DEFIBRILLATOR IMPLANT N/A 10/10/2011   Procedure: IMPLANTABLE CARDIOVERTER DEFIBRILLATOR IMPLANT;  Surgeon: Evans Lance, MD;  Location: [facility] CATH LAB;  Service: Cardiovascular;  Laterality: N/A;  . MULTIPLE EXTRACTIONS WITH ALVEOLOPLASTY N/A  09/24/2012   Procedure: MULTIPLE EXTRACION #2, 4, 6, 7 ,8, 9, 11, 13, 18, 20, 21, 22, 23, 24, 25, 26, 27, 29 WITH ALVEOLOPLASTY, BIOPSY OF PALATE LESION, REMOVA RIGHT LINGUAL TORUS;  Surgeon: Gae Bon, DDS;  Location: [place];  Service: Oral Surgery;  Laterality: N/A;  . TEE WITHOUT CARDIOVERSION N/A 07/11/2012   Procedure: TRANSESOPHAGEAL ECHOCARDIOGRAM (TEE);  Surgeon: Thayer Headings, MD;  Location: [place];  Service: Cardiovascular;  Laterality: N/A;  . TUBAL LIGATION     Social History   Social History  . Marital status: Divorced    Spouse name: N/A  . Number of children: 2  . Years of education: N/A   Occupational History  .  [employer]    Works in [place] Topics  . Smoking status: Current Every Day Smoker    Packs/day: 0.50    Years: 21.00    Types: Cigarettes    Start date: 07/02/1983  . Smokeless tobacco: Never Used     Comment: 5 ciggs daily 11-26-12  . Alcohol use No  . Drug use: No  . Sexual activity: No   Other Topics Concern  . None   Social History Narrative  . None   Outpatient Encounter Prescriptions as of 02/26/2016  Medication Sig  .  rosuvastatin (CRESTOR) 20 MG tablet Take 20 mg by mouth daily.  Marland Kitchen albuterol (PROVENTIL HFA;VENTOLIN HFA) 108 (90 BASE) MCG/ACT inhaler Inhale 2 puffs into the lungs every 6 (six) hours as needed for wheezing or shortness of breath.  Marland Kitchen aspirin EC 81 MG tablet Take 81 mg by mouth 2 (two) times daily.   . budesonide-formoterol (SYMBICORT) 80-4.5 MCG/ACT inhaler Inhale 2 puffs into the lungs 2 (two) times daily.  . carvedilol (COREG) 12.5 MG tablet Take 25 mg by mouth 2 (two) times daily with a meal.  . cetirizine (ZYRTEC) 10 MG tablet Take 10 mg by mouth daily.  . clobetasol (TEMOVATE) 0.05 % external solution Apply 1 application topically 2 (two) times daily as needed (Apply to scalp.).   Marland Kitchen cyclobenzaprine (FLEXERIL) 5 MG tablet Take 1 tablet (5 mg total) by mouth 3 (three) times  daily as needed. (Patient not taking: Reported on 11/10/2015)  . diclofenac sodium (VOLTAREN) 1 % GEL Apply 2 g topically 4 (four) times daily as needed (pain).   Marland Kitchen diphenhydrAMINE (BENADRYL) 25 mg capsule Take 25 mg by mouth every 8 (eight) hours as needed for allergies.   . Ferrous Sulfate (IRON) 28 MG TABS Take 1 tablet by mouth daily.  . fluticasone (CUTIVATE) 0.05 % cream Apply 1 application topically 2 (two) times daily.   . fluticasone (FLONASE) 50 MCG/ACT nasal spray Place 2 sprays into both nostrils daily as needed for allergies.   . furosemide (LASIX) 40 MG tablet Take 1 tablet (40 mg total) by mouth 2 (two) times daily.  Marland Kitchen gabapentin (NEURONTIN) 800 MG tablet Take 800 mg by mouth 4 (four) times daily.  Marland Kitchen ketoconazole (NIZORAL) 2 % cream Apply 1 application topically 2 (two) times daily.   Marland Kitchen levothyroxine (SYNTHROID, LEVOTHROID) 200 MCG tablet Take 1 tablet (200 mcg total) by mouth daily before breakfast. Takes with 57mcg for a total of 276mcg  . levothyroxine (SYNTHROID, LEVOTHROID) 25 MCG tablet Take 1 tablet (25 mcg total) by mouth daily before breakfast. Takes with 221mcg to equal 246mcg  . lisinopril (PRINIVIL,ZESTRIL) 10 MG tablet Take 1 tablet (10 mg total) by mouth daily.  . potassium chloride SA (K-DUR,KLOR-CON) 20 MEQ tablet Take 2 tablets (40 mEq total) by mouth 2 (two) times daily.  . ranitidine (ZANTAC) 150 MG tablet Take 150 mg by mouth at bedtime.  . Topiramate ER (TROKENDI XR) 25 MG CP24 Take 1 capsule by mouth at bedtime.  . traMADol (ULTRAM) 50 MG tablet Take 1 tablet (50 mg total) by mouth every 6 (six) hours as needed.  . Vitamin D, Ergocalciferol, (DRISDOL) 50000 units CAPS capsule TAKE 1 CAPSULE BY MOUTH EVERY 7 DAYS   No facility-administered encounter medications on file as of 02/26/2016.    ALLERGIES: Allergies  Allergen Reactions  . Fish Allergy Anaphylaxis  . Iodinated Diagnostic Agents Hives and Other (See Comments)    Pulmonary problems; no frank  respiratory arrest  . Iodine Hives and Other (See Comments)    Pulmonary Problems   . Lentil Anaphylaxis  . Penicillins Anaphylaxis and Shortness Of Breath    Has patient had a PCN reaction causing immediate rash, facial/tongue/throat swelling, SOB or lightheadedness with hypotension: Yes Has patient had a PCN reaction causing severe rash involving mucus membranes or skin necrosis: No Has patient had a PCN reaction that required hospitalization No Has patient had a PCN reaction occurring within the last 10 years: No If all of the above answers are "NO", then may proceed with Cephalosporin use.  Hair loss  . Lipitor [Atorvastatin] Itching and Rash  . Spironolactone Rash  . Sulfa Antibiotics Other (See Comments)    Unknown   VACCINATION STATUS: Immunization History  Administered Date(s) Administered  . Pneumococcal Polysaccharide-23 09/28/2010, 11/18/2010    HPI  55 yr old female with medical hx as above. she is here to f/u for hypothyroidism. She decided not to pursue bariatric surgery. Last year, she has had 24 hour urine cortisol to r/o Cushings syndrome which was normal.  She is on LT4 225 mcg po qam. - She is slowly achieving weight loss, lost approximately 15 pounds over the last year, however Magda Paganini she is regaining. she denies palpitations, heat intolerance. she denies chest pain, SOB. she has family hx of thyroid dysfunction in her mother. she has AICD . she smokes 1/2 PPD ( 30 PY).  Review of Systems  Constitutional:  Recently reversing the weight loss she achieved last year , no fatigue, no subjective hyperthermia/hypothermia Eyes: no blurry vision, no xerophthalmia ENT: no sore throat, no nodules palpated in throat, no dysphagia/odynophagia, no hoarseness Cardiovascular: no CP/SOB/palpitations/leg swelling Respiratory: no cough/SOB Gastrointestinal: no N/V/D/C Musculoskeletal: no muscle/joint aches Skin: no rashes Neurological: no  tremors/numbness/tingling/dizziness Psychiatric: no depression/anxiety  Objective:    BP 120/74   Pulse 83   Ht 5' 7.5" (1.715 m)   Wt 278 lb (126.1 kg)   BMI 42.90 kg/m   Wt Readings from Last 3 Encounters:  02/26/16 278 lb (126.1 kg)  12/07/15 274 lb (124.3 kg)  11/10/15 267 lb (121.1 kg)    Physical Exam  Constitutional: obese, in NAD Eyes: PERRLA, EOMI, no exophthalmos ENT: moist mucous membranes, no thyromegaly, no cervical lymphadenopathy Cardiovascular: RRR, No MRG Respiratory: CTA B Gastrointestinal: abdomen soft, NT, ND, BS+ Musculoskeletal: no deformities, strength intact in all 4 Skin: moist, warm, no rashes Neurological: no tremor with outstretched hands, DTR normal in all 4  Diabetic Labs (most recent): Lab Results  Component Value Date   HGBA1C 5.7 (H) 07/26/2015   HGBA1C 5.7 (H) 04/29/2013   HGBA1C 5.6 07/08/2012       Component Value Date/Time   CHOL 166 02/19/2016 1013   TRIG 217 (H) 02/19/2016 1013   HDL 38 (L) 02/19/2016 1013   CHOLHDL 4.4 02/19/2016 1013   VLDL 43 (H) 02/19/2016 1013   LDLCALC 85 02/19/2016 1013   Assessment & Plan:   1. Hypothyroidism, unspecified hypothyroidism type -she has long standing hypothyroidism x 11 yrs. her TFTs are now on target on 225 mcg.   - We discussed about correct intake of levothyroxine, at fasting, with water, separated by at least 30 minutes from breakfast, and separated by more than 4 hours from calcium, iron, multivitamins, acid reflux medications (PPIs). -Patient is made aware of the fact that thyroid hormone replacement is needed for life, dose to be adjusted by periodic monitoring of thyroid function tests.  2. Morbid obesity due to excess calories ([place]) - She has lost 15 over the last year, however she is lately regaining that weight.  - she is given detailed carbs and exercise information. She does not have diabetes. Her 24 hour urine free cotisol is normal at 14.5, rules out Cushing's synrome.   She has seen  Jearld Fenton, CDE. Due to her legitimate concern on weight, I discussed bariatric surgery , however, she decided not to pursue that option.  3. Essential hypertension  -I advised her to continue her Lisinopril and carvedilol for HTN.  4. Hyperlipidemia - continue Crestor 20  mg by mouth daily at bedtime.   5. Vitamin D deficiency: She is on vitamin D supplement 50,000 units weekly.  6. Chronic heavy smoker:  She has COPD. She is  extensively counseled against smoking. He is not ready to quit.  - 25 minutes of time was spent on the care of this patient , 50% of which was applied for counseling on prevention of  Diabetes. - I advised patient to maintain close follow up with [facility], [facility] for primary care needs. Follow up plan: Return in about 6 months (around 08/26/2016) for follow up with pre-visit labs.  Glade Lloyd, MD Phone: [phone]  Fax: [phone]   02/26/2016, 10:31 AM

## 2016-02-29 ENCOUNTER — Telehealth: Payer: Self-pay | Admitting: Internal Medicine

## 2016-02-29 NOTE — Telephone Encounter (Signed)
New message   In November patient transmission didn't go through, and to receive a new box. Calling to redo transmission.

## 2016-02-29 NOTE — Telephone Encounter (Signed)
Spoke w/ pt and instructed her how to send a remote transmission. Pt verbalized understanding.

## 2016-03-02 ENCOUNTER — Encounter: Payer: Medicare Other | Attending: "Endocrinology | Admitting: Nutrition

## 2016-03-02 ENCOUNTER — Encounter: Payer: Self-pay | Admitting: Nutrition

## 2016-03-02 DIAGNOSIS — Z713 Dietary counseling and surveillance: Secondary | ICD-10-CM | POA: Insufficient documentation

## 2016-03-02 DIAGNOSIS — Z6841 Body Mass Index (BMI) 40.0 and over, adult: Secondary | ICD-10-CM | POA: Insufficient documentation

## 2016-03-02 DIAGNOSIS — E785 Hyperlipidemia, unspecified: Secondary | ICD-10-CM

## 2016-03-02 NOTE — Progress Notes (Signed)
  Medical Nutrition Therapy:  Appt start time: 1100  end time: 1115 Assessment:  Primary concerns today: Obesity and prediabetes follow up.  Changes: Drinking more water and trying to execise when she can. Trying to eat baked and broiled foods. Eating more dark leafy greens. Wants to lose weight but limited with mobility and pain of her joints. Has regained some weight recently. Needs to be consistent with her food choices and increased physical activity for needed weight loss. No new A1C lately. She is still smoking.  Ate something at church and her face/neck has broke out in a rash. Hasn't gone to see a determalogist. Its getting better on its own.      Lab Results  Component Value Date   HGBA1C 5.7 (H) 07/26/2015   Wt Readings from Last 3 Encounters:  02/26/16 278 lb (126.1 kg)  12/07/15 274 lb (124.3 kg)  11/10/15 267 lb (121.1 kg)   Ht Readings from Last 3 Encounters:  02/26/16 5' 7.5" (1.715 m)  12/07/15 5' 7.5" (1.715 m)  11/10/15 5\' 7"  (1.702 m)   Lipid Panel     Component Value Date/Time   CHOL 166 02/19/2016 1013   TRIG 217 (H) 02/19/2016 1013   HDL 38 (L) 02/19/2016 1013   CHOLHDL 4.4 02/19/2016 1013   VLDL 43 (H) 02/19/2016 1013   LDLCALC 85 02/19/2016 1013   Preferred Learning Style:     No preference indicated   Learning Readiness:    Ready  Change in progress   MEDICATIONS: see list   DIETARY INTAKE:  24-hr recall:  B ( AM): Boiled  egg and bran flakes, water  L ( PM): Grilled chicken salad with raspberry vinaigrette. apple Snk ( PM):  D ( PM): Collard greens, chicken breast with PIntos water bverages: water,   Usual physical activity: walking 30 minutes twice a week .   Estimated energy needs: 1600 calories 180 g carbohydrates 120 g protein 44 g fat  Progress Towards Goal(s):  In progress.   Nutritional Diagnosis:  NB-1.1 Food and nutrition-related knowledge deficit As related to Obesity.  As evidenced by BMI > 40.    Intervention:   Nutrition counseling on healthy weight loss tips, ways to prevent diabetes and importance of exercise. Low sodium, low fat, high fiber diet and meal planning and exercises for needed weight loss. High Fiber low fat diet  Goals 1. Increase physical to to 60 minutes 4 times per week 2. Lose 1 lbs per week or 4 lbs per month Continue to work on quitting smoking.  Teaching Method Utilized:  Visual Auditory Hands on  Handouts given during visit include: Healthy Weight loss tips Chair exercises  Barriers to learning/adherence to lifestyle change: none  Demonstrated degree of understanding via:  Teach Back   Monitoring/Evaluation:  Dietary intake, exercise, meal planning, and body weight in 3 month(s).  Lipid profile is elevated. Recommend to consider statin drugs since she is a smoker.

## 2016-03-02 NOTE — Patient Instructions (Addendum)
Goals 1. Increase physical to to 60 minutes 4 times per week 2. Lose 1 lbs per week or 4 lbs per month

## 2016-03-10 ENCOUNTER — Ambulatory Visit (INDEPENDENT_AMBULATORY_CARE_PROVIDER_SITE_OTHER): Payer: Medicare Other | Admitting: *Deleted

## 2016-03-10 DIAGNOSIS — I639 Cerebral infarction, unspecified: Secondary | ICD-10-CM

## 2016-03-10 NOTE — Progress Notes (Signed)
Carelink Summary Report / Loop Recorder 

## 2016-03-21 LAB — CUP PACEART REMOTE DEVICE CHECK
Implantable Lead Model: 181
MDC IDC LEAD IMPLANT DT: 20130729
MDC IDC LEAD LOCATION: 753860
MDC IDC LEAD SERIAL: 322336
MDC IDC PG IMPLANT DT: 20170601
MDC IDC SESS DTM: 20171128154021

## 2016-03-28 ENCOUNTER — Ambulatory Visit: Payer: Medicare Other | Attending: Neurology | Admitting: Neurology

## 2016-03-28 DIAGNOSIS — G4733 Obstructive sleep apnea (adult) (pediatric): Secondary | ICD-10-CM

## 2016-03-31 NOTE — Procedures (Signed)
Venedy A. Merlene Laughter, MD     [email or web address]             NOCTURNAL POLYSOMNOGRAPHY   LOCATION: [facility]  Patient Name: Anahid, Muser Date: 03/28/2016 Gender: Female D.O.B: 11/02/1960 Age (years): 4 Referring Provider: Barton Fanny NP Height (inches): 67 Interpreting Physician: Phillips Odor MD, ABSM Weight (lbs): 268 RPSGT: Rosebud Poles BMI: 42 MRN: [id number] Neck Size: 16.00 CLINICAL INFORMATION Sleep Study Type: NPSG  Indication for sleep study: N/A  Epworth Sleepiness Score: 7  SLEEP STUDY TECHNIQUE As per the AASM Manual for the Scoring of Sleep and Associated Events v2.3 (April 2016) with a hypopnea requiring 4% desaturations.  The channels recorded and monitored were frontal, central and occipital EEG, electrooculogram (EOG), submentalis EMG (chin), nasal and oral airflow, thoracic and abdominal wall motion, anterior tibialis EMG, snore microphone, electrocardiogram, and pulse oximetry.  MEDICATIONS Medications self-administered by patient taken the night of the study : N/A  Current Outpatient Prescriptions:  .  albuterol (PROVENTIL HFA;VENTOLIN HFA) 108 (90 BASE) MCG/ACT inhaler, Inhale 2 puffs into the lungs every 6 (six) hours as needed for wheezing or shortness of breath., Disp: 1 Inhaler, Rfl: 5 .  aspirin EC 81 MG tablet, Take 81 mg by mouth 2 (two) times daily. , Disp: , Rfl:  .  budesonide-formoterol (SYMBICORT) 80-4.5 MCG/ACT inhaler, Inhale 2 puffs into the lungs 2 (two) times daily., Disp: , Rfl:  .  carvedilol (COREG) 12.5 MG tablet, Take 25 mg by mouth 2 (two) times daily with a meal., Disp: , Rfl:  .  cetirizine (ZYRTEC) 10 MG tablet, Take 10 mg by mouth daily., Disp: , Rfl:  .  clobetasol (TEMOVATE) 0.05 % external solution, Apply 1 application topically 2 (two) times daily as needed (Apply to scalp.). , Disp: , Rfl:  .  cyclobenzaprine (FLEXERIL) 5 MG tablet, Take 1 tablet (5 mg total) by mouth 3  (three) times daily as needed. (Patient not taking: Reported on 11/10/2015), Disp: 30 tablet, Rfl: 0 .  diclofenac sodium (VOLTAREN) 1 % GEL, Apply 2 g topically 4 (four) times daily as needed (pain). , Disp: , Rfl:  .  diphenhydrAMINE (BENADRYL) 25 mg capsule, Take 25 mg by mouth every 8 (eight) hours as needed for allergies. , Disp: , Rfl:  .  Ferrous Sulfate (IRON) 28 MG TABS, Take 1 tablet by mouth daily., Disp: , Rfl:  .  fluticasone (CUTIVATE) 0.05 % cream, Apply 1 application topically 2 (two) times daily. , Disp: , Rfl:  .  fluticasone (FLONASE) 50 MCG/ACT nasal spray, Place 2 sprays into both nostrils daily as needed for allergies. , Disp: , Rfl:  .  furosemide (LASIX) 40 MG tablet, Take 1 tablet (40 mg total) by mouth 2 (two) times daily., Disp: 180 tablet, Rfl: 0 .  gabapentin (NEURONTIN) 800 MG tablet, Take 800 mg by mouth 4 (four) times daily., Disp: , Rfl:  .  ketoconazole (NIZORAL) 2 % cream, Apply 1 application topically 2 (two) times daily. , Disp: , Rfl:  .  levothyroxine (SYNTHROID, LEVOTHROID) 200 MCG tablet, Take 1 tablet (200 mcg total) by mouth daily before breakfast. Takes with 37mcg for a total of 25mcg, Disp: 90 tablet, Rfl: 2 .  levothyroxine (SYNTHROID, LEVOTHROID) 25 MCG tablet, Take 1 tablet (25 mcg total) by mouth daily before breakfast. Takes with 211mcg to equal 233mcg, Disp: 90 tablet, Rfl: 2 .  lisinopril (PRINIVIL,ZESTRIL) 10 MG tablet, Take 1 tablet (10 mg total) by mouth daily., Disp: 90  tablet, Rfl: 0 .  potassium chloride SA (K-DUR,KLOR-CON) 20 MEQ tablet, Take 2 tablets (40 mEq total) by mouth 2 (two) times daily., Disp: 180 tablet, Rfl: 0 .  ranitidine (ZANTAC) 150 MG tablet, Take 150 mg by mouth at bedtime., Disp: , Rfl:  .  rosuvastatin (CRESTOR) 20 MG tablet, Take 20 mg by mouth daily., Disp: , Rfl:  .  Topiramate ER (TROKENDI XR) 25 MG CP24, Take 1 capsule by mouth at bedtime., Disp: , Rfl:  .  traMADol (ULTRAM) 50 MG tablet, Take 1 tablet (50 mg total)  by mouth every 6 (six) hours as needed., Disp: 15 tablet, Rfl: 0 .  Vitamin D, Ergocalciferol, (DRISDOL) 50000 units CAPS capsule, TAKE 1 CAPSULE BY MOUTH EVERY 7 DAYS, Disp: 4 capsule, Rfl: 2   SLEEP ARCHITECTURE The study was initiated at 10:00:42 PM and ended at 4:45:35 AM.  Sleep onset time was 45.3 minutes and the sleep efficiency was 76.8%. The total sleep time was 311.1 minutes.  Stage REM latency was 64.0 minutes.  The patient spent 3.38% of the night in stage N1 sleep, 45.52% in stage N2 sleep, 30.54% in stage N3 and 20.57% in REM.  Alpha intrusion was absent.  Supine sleep was 26.20%.  RESPIRATORY PARAMETERS The overall apnea/hypopnea index (AHI) was 7.5 per hour. There were 0 total apneas, including 0 obstructive, 0 central and 0 mixed apneas. There were 39 hypopneas and 0 RERAs.  The AHI during Stage REM sleep was 32.8 per hour.  AHI while supine was 14.0 per hour.  The mean oxygen saturation was 92.66%. The minimum SpO2 during sleep was 83.00%.  Moderate snoring was noted during this study.  CARDIAC DATA The 2 lead EKG demonstrated sinus rhythm. The mean heart rate was N/A beats per minute. Other EKG findings include: PVCs. LEG MOVEMENT DATA The total PLMS were 20 with a resulting PLMS index of 3.86. Associated arousal with leg movement index was 2.9.  IMPRESSIONS - Mild obstructive sleep apnea worse during REM sleep is observed during this recording. The severity does not require positive pressure treatment.   Delano Metz, MD Diplomate, American Board of Sleep Medicine.

## 2016-04-08 ENCOUNTER — Emergency Department (HOSPITAL_COMMUNITY): Payer: Medicare Other

## 2016-04-08 ENCOUNTER — Emergency Department (HOSPITAL_COMMUNITY)
Admission: EM | Admit: 2016-04-08 | Discharge: 2016-04-09 | Disposition: A | Discharge status: Home / Self Care | Payer: Medicare Other | Attending: Emergency Medicine | Admitting: Emergency Medicine

## 2016-04-08 ENCOUNTER — Encounter (HOSPITAL_COMMUNITY): Payer: Self-pay | Admitting: Emergency Medicine

## 2016-04-08 DIAGNOSIS — Z7982 Long term (current) use of aspirin: Secondary | ICD-10-CM | POA: Diagnosis not present

## 2016-04-08 DIAGNOSIS — J449 Chronic obstructive pulmonary disease, unspecified: Secondary | ICD-10-CM | POA: Diagnosis not present

## 2016-04-08 DIAGNOSIS — J45909 Unspecified asthma, uncomplicated: Secondary | ICD-10-CM | POA: Diagnosis not present

## 2016-04-08 DIAGNOSIS — R05 Cough: Secondary | ICD-10-CM | POA: Diagnosis present

## 2016-04-08 DIAGNOSIS — I110 Hypertensive heart disease with heart failure: Secondary | ICD-10-CM | POA: Diagnosis not present

## 2016-04-08 DIAGNOSIS — J181 Lobar pneumonia, unspecified organism: Secondary | ICD-10-CM | POA: Insufficient documentation

## 2016-04-08 DIAGNOSIS — I5022 Chronic systolic (congestive) heart failure: Secondary | ICD-10-CM | POA: Diagnosis not present

## 2016-04-08 DIAGNOSIS — J189 Pneumonia, unspecified organism: Secondary | ICD-10-CM

## 2016-04-08 DIAGNOSIS — R197 Diarrhea, unspecified: Secondary | ICD-10-CM | POA: Diagnosis not present

## 2016-04-08 DIAGNOSIS — Z79899 Other long term (current) drug therapy: Secondary | ICD-10-CM | POA: Insufficient documentation

## 2016-04-08 DIAGNOSIS — F17210 Nicotine dependence, cigarettes, uncomplicated: Secondary | ICD-10-CM | POA: Diagnosis not present

## 2016-04-08 MED ORDER — IPRATROPIUM-ALBUTEROL 0.5-2.5 (3) MG/3ML IN SOLN
3.0000 mL | Freq: Once | RESPIRATORY_TRACT | Status: AC
  Administered 2016-04-08: 3 mL via RESPIRATORY_TRACT
  Filled 2016-04-08: qty 3

## 2016-04-08 NOTE — ED Provider Notes (Signed)
[name] DEPT Provider Note   CSN: [id number] Arrival date & time: 04/08/16  2257     History   Chief Complaint Chief Complaint  Patient presents with  . Influenza    HPI Melanie Cordova is a 55 y.o. female.  HPI   Melanie Cordova is a 56 y.o. female, with a history of COPD, CHF, ICD, and Asthma, presenting to the ED with productive cough with yellow sputum, nasal congestion, body aches, malaise, chills, and diarrhea of the last 5 days. Pt has not taken any medications other than her normal daily medications. She has used her PRN albuterol inhaler twice today. Denies chest pain, current shortness of breath, known fever, N/V, or any other complaints.   Denies antibiotic use or hospitalization in the past 3 months.   Past Medical History:  Diagnosis Date  . Anxiety   . Arthritis   . Asthma   . Automatic implantable cardioverter-defibrillator in situ    2013, july  . Bell palsy    states has had 3 episodes  . Cardiomyopathy 08/2010   Presented with congestive heart failure; EF of 15% and 2012; hypotension on medication precludes optimal dosing  . CHF (congestive heart failure) ([place])   . Chronic systolic heart failure ([place])   . COPD (chronic obstructive pulmonary disease) ([place])    2013  . Dysrhythmia   . Gastroesophageal reflux disease   . Headache(784.0)   . Hyperlipidemia   . Hypertension    09/2010-normal CMet and CBC; Lipid profile-116, 88, 25, 73  . Hypothyroidism    Recent TSH was normal.  . 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 Neuromuscular disorder (HCC)    Neuropathy, right foot and leg  . Neuropathy ([place])   . Obesity   . Pneumonia   . Seizures ([place])    last one at age 59  . Shortness of breath   . Stroke [facility])     stroke in 07/2012 and another in June, 2014  . Tobacco abuse    20 pack years    Patient Active Problem List   Diagnosis Date Noted  . Vitamin D deficiency 02/26/2016  . Cryptogenic stroke ([place])  08/13/2015  . Weakness of face muscles 07/25/2015  . Morbid obesity due to excess calories ([place]) 03/05/2015  . Rectal bleeding 11/05/2013  . Chronic systolic heart failure ([place]) 05/15/2013  . Asthma 04/29/2013  . Migraine variant 11/24/2012  . Weakness 11/15/2012  . Gingival disease 07/25/2012  . TIA (transient ischemic attack) 11/07/2011  . Automatic implantable cardioverter-defibrillator in situ 10/12/2011  . Hypokalemia 10/01/2011  . Tobacco abuse   . Hyperlipidemia   . Hypertension   . Gastroesophageal reflux disease   . Hypothyroidism   . Cardiomyopathy ([place]) 08/13/2010    Past Surgical History:  Procedure Laterality Date  . CESAREAN SECTION     X2  . COLONOSCOPY N/A 12/16/2013   Procedure: COLONOSCOPY;  Surgeon: Danie Binder, MD;  [facility];  Service: Endoscopy;  Laterality: N/A;  10:45-moved to 10/5 @ [place] notified pt  . EP IMPLANTABLE DEVICE     [employer]  . EP IMPLANTABLE DEVICE N/A 08/13/2015   Procedure: Loop Recorder Insertion;  Surgeon: Evans Lance, MD;  Location: [facility] CV LAB;  Service: Cardiovascular;  Laterality: N/A;  . IMPLANTABLE CARDIOVERTER DEFIBRILLATOR IMPLANT N/A 10/10/2011   Procedure: IMPLANTABLE CARDIOVERTER DEFIBRILLATOR IMPLANT;  Surgeon: Evans Lance, MD;  Location: [facility] CATH LAB;  Service: Cardiovascular;  Laterality:  N/A;  . MULTIPLE EXTRACTIONS WITH ALVEOLOPLASTY N/A 09/24/2012   Procedure: MULTIPLE EXTRACION #2, 4, 6, 7 ,8, 9, 11, 13, 18, 20, 21, 22, 23, 24, 25, 26, 27, 29 WITH ALVEOLOPLASTY, BIOPSY OF PALATE LESION, REMOVA RIGHT LINGUAL TORUS;  Surgeon: Gae Bon, DDS;  Location: [facility];  Service: Oral Surgery;  Laterality: N/A;  . TEE WITHOUT CARDIOVERSION N/A 07/11/2012   Procedure: TRANSESOPHAGEAL ECHOCARDIOGRAM (TEE);  Surgeon: Thayer Headings, MD;  Location: [facility];  Service: Cardiovascular;  Laterality: N/A;  . TUBAL LIGATION      OB History    No data available       Home Medications    Prior  to Admission medications   Medication Sig Start Date End Date Taking? Authorizing Provider  albuterol (PROVENTIL HFA;VENTOLIN HFA) 108 (90 BASE) MCG/ACT inhaler Inhale 2 puffs into the lungs every 6 (six) hours as needed for wheezing or shortness of breath. 07/12/12   Trinda Pascal, MD  aspirin EC 81 MG tablet Take 81 mg by mouth 2 (two) times daily.     Historical Provider, MD  azithromycin (ZITHROMAX Z-PAK) 250 MG tablet Take 1 tablet (250 mg total) by mouth daily. 04/10/16   Maricela Schreur C Mahonri Seiden, PA-C  benzonatate (TESSALON) 100 MG capsule Take 1 capsule (100 mg total) by mouth every 8 (eight) hours. 04/09/16   Shacara Cozine C Alba Kriesel, PA-C  budesonide-formoterol (SYMBICORT) 80-4.5 MCG/ACT inhaler Inhale 2 puffs into the lungs 2 (two) times daily.    Historical Provider, MD  carvedilol (COREG) 12.5 MG tablet Take 25 mg by mouth 2 (two) times daily with a meal.    Historical Provider, MD  cetirizine (ZYRTEC) 10 MG tablet Take 10 mg by mouth daily.    Historical Provider, MD  clobetasol (TEMOVATE) 0.05 % external solution Apply 1 application topically 2 (two) times daily as needed (Apply to scalp.).  07/24/15   Historical Provider, MD  cyclobenzaprine (FLEXERIL) 5 MG tablet Take 1 tablet (5 mg total) by mouth 3 (three) times daily as needed. Patient not taking: Reported on 11/10/2015 10/06/15   Rolland Porter, MD  diclofenac sodium (VOLTAREN) 1 % GEL Apply 2 g topically 4 (four) times daily as needed (pain).     Historical Provider, MD  diphenhydrAMINE (BENADRYL) 25 mg capsule Take 25 mg by mouth every 8 (eight) hours as needed for allergies.     Historical Provider, MD  Ferrous Sulfate (IRON) 28 MG TABS Take 1 tablet by mouth daily.    Historical Provider, MD  fluticasone (CUTIVATE) 0.05 % cream Apply 1 application topically 2 (two) times daily.  10/21/15   Historical Provider, MD  fluticasone (FLONASE) 50 MCG/ACT nasal spray Place 2 sprays into both nostrils daily as needed for allergies.     Historical Provider, MD  furosemide  (LASIX) 40 MG tablet Take 1 tablet (40 mg total) by mouth 2 (two) times daily. 08/29/12   Yehuda Savannah, MD  gabapentin (NEURONTIN) 800 MG tablet Take 800 mg by mouth 4 (four) times daily. 10/30/15   Historical Provider, MD  ketoconazole (NIZORAL) 2 % cream Apply 1 application topically 2 (two) times daily.  10/21/15   Historical Provider, MD  levothyroxine (SYNTHROID, LEVOTHROID) 200 MCG tablet Take 1 tablet (200 mcg total) by mouth daily before breakfast. Takes with 41mcg for a total of 228mcg 09/03/15   [name], MD  levothyroxine (SYNTHROID, LEVOTHROID) 25 MCG tablet Take 1 tablet (25 mcg total) by mouth daily before breakfast. Takes with 255mcg to equal  236mcg 09/03/15   [name], MD  lisinopril (PRINIVIL,ZESTRIL) 10 MG tablet Take 1 tablet (10 mg total) by mouth daily. 08/29/12   Yehuda Savannah, MD  potassium chloride SA (K-DUR,KLOR-CON) 20 MEQ tablet Take 2 tablets (40 mEq total) by mouth 2 (two) times daily. 08/29/12   Yehuda Savannah, MD  predniSONE (STERAPRED UNI-PAK 21 TAB) 10 MG (21) TBPK tablet Take 6 tabs day 1, 5 tabs day 2, 4 tabs day 3, 3 tabs day 4, 2 tabs day 5, and 1 tab on day 6. 04/09/16   Tarina Volk C Shatoya Roets, PA-C  ranitidine (ZANTAC) 150 MG tablet Take 150 mg by mouth at bedtime.    Historical Provider, MD  rosuvastatin (CRESTOR) 20 MG tablet Take 20 mg by mouth daily.    Historical Provider, MD  Topiramate ER (TROKENDI XR) 25 MG CP24 Take 1 capsule by mouth at bedtime.    Historical Provider, MD  traMADol (ULTRAM) 50 MG tablet Take 1 tablet (50 mg total) by mouth every 6 (six) hours as needed. 11/10/15   Tanna Furry, MD  Vitamin D, Ergocalciferol, (DRISDOL) 50000 units CAPS capsule TAKE 1 CAPSULE BY MOUTH EVERY 7 DAYS 11/04/15   [name], MD    Family History Family History  Problem Relation Age of Onset  . Cardiomyopathy Mother     ICD pacemaker-ischmic CM  . Heart failure Mother   . Hypertension Mother   . Diabetes Mother   . Thyroid disease  Mother   . Cardiomyopathy Father     Deceased  . Coronary artery disease Father   . Heart failure Father   . Heart failure Brother   . Hypertension Brother   . Endometriosis Sister   . Colon cancer Neg Hx   . Liver disease Neg Hx     Social History Social History  Substance Use Topics  . Smoking status: Current Every Day Smoker    Packs/day: 0.50    Years: 21.00    Types: Cigarettes    Start date: 07/02/1983  . Smokeless tobacco: Never Used     Comment: 5 ciggs daily 11-26-12  . Alcohol use No     Allergies   Fish allergy; Iodinated diagnostic agents; Iodine; Lentil; Penicillins; Lipitor [atorvastatin]; Spironolactone; and Sulfa antibiotics   Review of Systems Review of Systems  Constitutional: Positive for chills.  HENT: Positive for congestion.   Respiratory: Positive for cough. Negative for shortness of breath.   Cardiovascular: Negative for chest pain.  Gastrointestinal: Positive for diarrhea. Negative for abdominal pain, nausea and vomiting.  Genitourinary: Negative for decreased urine volume and difficulty urinating.  Neurological: Negative for headaches.  All other systems reviewed and are negative.    Physical Exam Updated Vital Signs BP 115/71 (BP Location: Left Arm)   Pulse 91   Temp 98.6 F (37 C) (Oral)   Resp 20   SpO2 97%   Physical Exam  Constitutional: She appears well-developed and well-nourished. No distress.  HENT:  Head: Normocephalic and atraumatic.  Nasal congestion noted.  Eyes: Conjunctivae are normal.  Neck: Normal range of motion. Neck supple.  Cardiovascular: Normal rate, regular rhythm, normal heart sounds and intact distal pulses.   Pulmonary/Chest: Effort normal. She has wheezes (global).  No noted increased work of breathing. Patient speaks in full sentences without difficulty.  Abdominal: Soft. There is no tenderness. There is no guarding.  Musculoskeletal: She exhibits no edema.  Lymphadenopathy:    She has no cervical  adenopathy.  Neurological: She is  alert.  Skin: Skin is warm and dry. She is not diaphoretic.  Psychiatric: She has a normal mood and affect. Her behavior is normal.  Nursing note and vitals reviewed.    ED Treatments / Results  Labs (all labs ordered are listed, but only abnormal results are displayed) Labs Reviewed  BASIC METABOLIC PANEL - Abnormal; Notable for the following:       Result Value   Chloride 100 (*)    Creatinine, Ser 1.04 (*)    Calcium 8.6 (*)    GFR calc non Af Amer 60 (*)    All other components within normal limits  CBC WITH DIFFERENTIAL/PLATELET - Abnormal; Notable for the following:    WBC 11.1 (*)    RBC 5.15 (*)    Hemoglobin 15.2 (*)    HCT 46.2 (*)    Monocytes Absolute 1.2 (*)    All other components within normal limits  INFLUENZA PANEL BY PCR (TYPE A & B)    EKG  EKG Interpretation None       Radiology Dg Chest 2 View  Result Date: 04/09/2016 CLINICAL DATA:  56 year old female with shortness of breath and productive cough and wheezing. EXAM: CHEST  2 VIEW COMPARISON:  Chest radiograph dated 11/10/2015 FINDINGS: There is an area of increased density at the left lung base (best seen on the lateral projection) which is new since the prior radiograph and is concerning for developing infiltrate. Clinical correlation and follow-up resolution is recommended. Bibasilar subsegmental atelectatic changes noted. There is no pleural effusion or pneumothorax. Stable cardiac silhouette. Left pectoral AICD device and and implanted loop recorder noted. No acute osseous pathology. IMPRESSION: Focal left lung base density concerning for developing infiltrate. Clinical correlation and follow-up to resolution is recommended. Electronically Signed   By: Anner Crete M.D.   On: 04/09/2016 00:21    Procedures Procedures (including critical care time)  Medications Ordered in ED Medications  benzonatate (TESSALON) capsule 200 mg (not administered)    ipratropium-albuterol (DUONEB) 0.5-2.5 (3) MG/3ML nebulizer solution 3 mL (3 mLs Nebulization Given 04/08/16 2356)  predniSONE (DELTASONE) tablet 60 mg (60 mg Oral Given 04/09/16 0018)  azithromycin (ZITHROMAX) tablet 500 mg (500 mg Oral Given 04/09/16 0119)     Initial Impression / Assessment and Plan / ED Course  I have reviewed the triage vital signs and the nursing notes.  Pertinent labs & imaging results that were available during my care of the patient were reviewed by me and considered in my medical decision making (see chart for details).     Patient presents with productive cough, congestion, and chills for the past 5 days. Voices improvement with DuoNeb. Evidence of possible early pneumonia on chest x-ray. Patient is nontoxic appearing, afebrile, not tachycardic, not tachypneic, not hypotensive, maintains SPO2 of 97-98% on room air, and is in no apparent distress. Patient has no signs of sepsis or other serious or life-threatening condition. Patient given strict return precautions. PCP follow-up. Patient voices understanding of all instructions and is comfortable with discharge.   Findings and plan of care discussed with Orpah Greek, MD.   Vitals:   04/08/16 2302 04/08/16 2358  BP: 115/71   Pulse: 91 82  Resp: 20 20  Temp: 98.6 F (37 C)   TempSrc: Oral   SpO2: 97% 98%     Final Clinical Impressions(s) / ED Diagnoses   Final diagnoses:  Community acquired pneumonia of left lower lobe of lung ([place])    New Prescriptions New Prescriptions  AZITHROMYCIN (ZITHROMAX Z-PAK) 250 MG TABLET    Take 1 tablet (250 mg total) by mouth daily.   BENZONATATE (TESSALON) 100 MG CAPSULE    Take 1 capsule (100 mg total) by mouth every 8 (eight) hours.   PREDNISONE (STERAPRED UNI-PAK 21 TAB) 10 MG (21) TBPK TABLET    Take 6 tabs day 1, 5 tabs day 2, 4 tabs day 3, 3 tabs day 4, 2 tabs day 5, and 1 tab on day 6.     Lorayne Bender, PA-C 04/09/16 [place],  MD 04/09/16 [phone]

## 2016-04-08 NOTE — ED Triage Notes (Signed)
Cough,congestion, chills, headache since Sunday. Symptoms are worse today

## 2016-04-09 DIAGNOSIS — J181 Lobar pneumonia, unspecified organism: Secondary | ICD-10-CM | POA: Diagnosis not present

## 2016-04-09 LAB — CBC WITH DIFFERENTIAL/PLATELET
BASOS ABS: 0 10*3/uL (ref 0.0–0.1)
BASOS PCT: 0 %
EOS PCT: 1 %
Eosinophils Absolute: 0.2 10*3/uL (ref 0.0–0.7)
HCT: 46.2 % — ABNORMAL HIGH (ref 36.0–46.0)
Hemoglobin: 15.2 g/dL — ABNORMAL HIGH (ref 12.0–15.0)
Lymphocytes Relative: 30 %
Lymphs Abs: 3.3 10*3/uL (ref 0.7–4.0)
MCH: 29.5 pg (ref 26.0–34.0)
MCHC: 32.9 g/dL (ref 30.0–36.0)
MCV: 89.7 fL (ref 78.0–100.0)
MONO ABS: 1.2 10*3/uL — AB (ref 0.1–1.0)
MONOS PCT: 11 %
Neutro Abs: 6.4 10*3/uL (ref 1.7–7.7)
Neutrophils Relative %: 58 %
PLATELETS: 226 10*3/uL (ref 150–400)
RBC: 5.15 MIL/uL — ABNORMAL HIGH (ref 3.87–5.11)
RDW: 14 % (ref 11.5–15.5)
WBC: 11.1 10*3/uL — ABNORMAL HIGH (ref 4.0–10.5)

## 2016-04-09 LAB — BASIC METABOLIC PANEL
Anion gap: 10 (ref 5–15)
BUN: 13 mg/dL (ref 6–20)
CALCIUM: 8.6 mg/dL — AB (ref 8.9–10.3)
CO2: 26 mmol/L (ref 22–32)
Chloride: 100 mmol/L — ABNORMAL LOW (ref 101–111)
Creatinine, Ser: 1.04 mg/dL — ABNORMAL HIGH (ref 0.44–1.00)
GFR calc Af Amer: >60 mL/min (ref >60)
GFR, EST NON AFRICAN AMERICAN: 60 mL/min — AB (ref >60)
GLUCOSE: 97 mg/dL (ref 65–99)
Potassium: 4 mmol/L (ref 3.5–5.1)
Sodium: 136 mmol/L (ref 135–145)

## 2016-04-09 LAB — INFLUENZA PANEL BY PCR (TYPE A & B)
INFLBPCR: NEGATIVE
Influenza A By PCR: NEGATIVE

## 2016-04-09 MED ORDER — AZITHROMYCIN 250 MG PO TABS
500.0000 mg | ORAL_TABLET | Freq: Once | ORAL | Status: AC
  Administered 2016-04-09: 500 mg via ORAL
  Filled 2016-04-09: qty 2

## 2016-04-09 MED ORDER — PREDNISONE 50 MG PO TABS
60.0000 mg | ORAL_TABLET | Freq: Once | ORAL | Status: AC
  Administered 2016-04-09: 60 mg via ORAL
  Filled 2016-04-09: qty 1

## 2016-04-09 MED ORDER — BENZONATATE 100 MG PO CAPS
200.0000 mg | ORAL_CAPSULE | Freq: Once | ORAL | Status: AC
  Administered 2016-04-09: 200 mg via ORAL
  Filled 2016-04-09: qty 2

## 2016-04-09 MED ORDER — PREDNISONE 10 MG (21) PO TBPK: ORAL_TABLET | ORAL | 0 refills | Status: DC

## 2016-04-09 MED ORDER — BENZONATATE 100 MG PO CAPS: 100.0000 mg | ORAL_CAPSULE | Freq: Three times a day (TID) | ORAL | 0 refills | Status: DC

## 2016-04-09 MED ORDER — AZITHROMYCIN 250 MG PO TABS: 250.0000 mg | ORAL_TABLET | Freq: Every day | ORAL | 0 refills | Status: DC

## 2016-04-09 NOTE — Discharge Instructions (Signed)
There was evidence of pneumonia in the left lung on chest x-ray. You have been given the first dose of antibiotics here in the ED. Begin with the next dose tomorrow. Take all of the antibiotics until finished. Take the prednisone until finished. Tessalon for cough. Follow up with your primary care provider as soon as possible for continued management and repeat evaluation. Return to the ED should symptoms worsen.

## 2016-04-09 NOTE — ED Provider Notes (Signed)
Patient presented to the ER with flulike illness  Face to face Exam: HEENT - PERRLA Lungs - light wheeze Heart - RRR, no M/R/G Abd - S/NT/ND Neuro - alert, oriented x3  Plan: Patient with early left lower lobe pneumonia. Oxygenation is normal. She is not in any respiratory distress. Treat with antibiotics and for COPD exacerbation, return if symptoms worsen.   Orpah Greek, MD 04/09/16 [phone]

## 2016-04-11 ENCOUNTER — Ambulatory Visit (INDEPENDENT_AMBULATORY_CARE_PROVIDER_SITE_OTHER): Payer: Medicare Other | Admitting: *Deleted

## 2016-04-11 DIAGNOSIS — I639 Cerebral infarction, unspecified: Secondary | ICD-10-CM | POA: Diagnosis not present

## 2016-04-11 LAB — CUP PACEART REMOTE DEVICE CHECK
Implantable Lead Implant Date: 20130729
Implantable Pulse Generator Implant Date: 20170601
MDC IDC LEAD LOCATION: 753860
MDC IDC LEAD SERIAL: 322336
MDC IDC SESS DTM: 20180127170622

## 2016-04-11 NOTE — Progress Notes (Signed)
Carelink Summary Report / Loop Recorder 

## 2016-04-21 LAB — CUP PACEART REMOTE DEVICE CHECK
Implantable Lead Location: 753860
Implantable Pulse Generator Implant Date: 20170601
MDC IDC LEAD IMPLANT DT: 20130729
MDC IDC LEAD SERIAL: 322336
MDC IDC SESS DTM: 20171228163805

## 2016-04-26 ENCOUNTER — Other Ambulatory Visit (HOSPITAL_COMMUNITY): Payer: Self-pay | Admitting: Internal Medicine

## 2016-04-26 ENCOUNTER — Ambulatory Visit (HOSPITAL_COMMUNITY)
Admission: RE | Admit: 2016-04-26 | Discharge: 2016-04-26 | Disposition: A | Discharge status: Home / Self Care | Payer: Medicare Other | Source: Ambulatory Visit | Attending: Internal Medicine | Admitting: Internal Medicine

## 2016-04-26 DIAGNOSIS — J410 Simple chronic bronchitis: Secondary | ICD-10-CM

## 2016-04-26 DIAGNOSIS — J449 Chronic obstructive pulmonary disease, unspecified: Secondary | ICD-10-CM | POA: Diagnosis present

## 2016-05-09 ENCOUNTER — Ambulatory Visit (INDEPENDENT_AMBULATORY_CARE_PROVIDER_SITE_OTHER): Payer: Medicare Other | Admitting: *Deleted

## 2016-05-09 DIAGNOSIS — I639 Cerebral infarction, unspecified: Secondary | ICD-10-CM | POA: Diagnosis not present

## 2016-05-09 NOTE — Progress Notes (Signed)
Carelink Summary Report / Loop Recorder 

## 2016-05-11 NOTE — Progress Notes (Signed)
Carelink summary report received. Battery status OK. Normal device function. No new symptom episodes, tachy episodes, brady, or pause episodes. No new AF episodes. Monthly summary reports and ROV/PRN 

## 2016-05-16 ENCOUNTER — Ambulatory Visit (INDEPENDENT_AMBULATORY_CARE_PROVIDER_SITE_OTHER): Payer: Medicare Other | Admitting: *Deleted

## 2016-05-16 ENCOUNTER — Telehealth: Payer: Self-pay | Admitting: Cardiology

## 2016-05-16 DIAGNOSIS — I429 Cardiomyopathy, unspecified: Secondary | ICD-10-CM

## 2016-05-16 DIAGNOSIS — I5022 Chronic systolic (congestive) heart failure: Secondary | ICD-10-CM | POA: Diagnosis not present

## 2016-05-16 NOTE — Telephone Encounter (Signed)
Spoke with pt and reminded pt of remote transmission that is due today. Pt verbalized understanding.   

## 2016-05-17 ENCOUNTER — Encounter: Payer: Self-pay | Admitting: Cardiology

## 2016-05-17 LAB — CUP PACEART REMOTE DEVICE CHECK
Battery Remaining Longevity: 64 mo
HighPow Impedance: 82 Ohm
HighPow Impedance: 82 Ohm
Implantable Lead Location: 753860
Implantable Lead Model: 181
Implantable Lead Serial Number: 322336
Lead Channel Sensing Intrinsic Amplitude: 12 mV
Lead Channel Setting Pacing Amplitude: 2.5 V
Lead Channel Setting Pacing Pulse Width: 0.5 ms
Lead Channel Setting Sensing Sensitivity: 0.5 mV
MDC IDC LEAD IMPLANT DT: 20130729
MDC IDC MSMT BATTERY REMAINING PERCENTAGE: 60 %
MDC IDC MSMT BATTERY VOLTAGE: 2.93 V
MDC IDC MSMT LEADCHNL RV IMPEDANCE VALUE: 660 Ohm
MDC IDC MSMT LEADCHNL RV PACING THRESHOLD AMPLITUDE: 0.75 V
MDC IDC MSMT LEADCHNL RV PACING THRESHOLD PULSEWIDTH: 0.5 ms
MDC IDC PG IMPLANT DT: 20130729
MDC IDC SESS DTM: 20180305191210
MDC IDC STAT BRADY RV PERCENT PACED: 1 %
Pulse Gen Serial Number: 1022442

## 2016-05-17 NOTE — Progress Notes (Signed)
Remote ICD transmission.   

## 2016-05-25 LAB — CUP PACEART REMOTE DEVICE CHECK
MDC IDC LEAD IMPLANT DT: 20130729
MDC IDC LEAD LOCATION: 753860
MDC IDC LEAD SERIAL: 322336
MDC IDC PG IMPLANT DT: 20170601
MDC IDC SESS DTM: 20180226174300

## 2016-06-01 ENCOUNTER — Encounter: Payer: Self-pay | Admitting: Cardiology

## 2016-06-08 ENCOUNTER — Ambulatory Visit (INDEPENDENT_AMBULATORY_CARE_PROVIDER_SITE_OTHER): Payer: Medicare Other | Admitting: *Deleted

## 2016-06-08 DIAGNOSIS — I639 Cerebral infarction, unspecified: Secondary | ICD-10-CM

## 2016-06-09 NOTE — Progress Notes (Signed)
Carelink Summary Report / Loop Recorder 

## 2016-06-23 LAB — CUP PACEART REMOTE DEVICE CHECK
Implantable Lead Implant Date: 20130729
MDC IDC LEAD LOCATION: 753860
MDC IDC LEAD SERIAL: 322336
MDC IDC PG IMPLANT DT: 20170601
MDC IDC SESS DTM: 20180328181132

## 2016-07-08 ENCOUNTER — Ambulatory Visit (INDEPENDENT_AMBULATORY_CARE_PROVIDER_SITE_OTHER): Payer: Medicare Other | Admitting: *Deleted

## 2016-07-08 DIAGNOSIS — I639 Cerebral infarction, unspecified: Secondary | ICD-10-CM

## 2016-07-08 NOTE — Progress Notes (Signed)
Carelink Summary Report / Loop Recorder 

## 2016-07-21 LAB — CUP PACEART REMOTE DEVICE CHECK
Date Time Interrogation Session: 20180427183811
Implantable Lead Location: 753860
Implantable Lead Model: 181
MDC IDC LEAD IMPLANT DT: 20130729
MDC IDC LEAD SERIAL: 322336
MDC IDC PG IMPLANT DT: 20170601

## 2016-08-05 ENCOUNTER — Ambulatory Visit (INDEPENDENT_AMBULATORY_CARE_PROVIDER_SITE_OTHER): Payer: Medicare Other | Admitting: Internal Medicine

## 2016-08-05 ENCOUNTER — Encounter: Payer: Self-pay | Admitting: Internal Medicine

## 2016-08-05 DIAGNOSIS — I5022 Chronic systolic (congestive) heart failure: Secondary | ICD-10-CM | POA: Diagnosis not present

## 2016-08-05 LAB — CUP PACEART INCLINIC DEVICE CHECK
Brady Statistic RV Percent Paced: 0 %
Date Time Interrogation Session: 20180525130036
HighPow Impedance: 88.875
Implantable Lead Implant Date: 20130729
Implantable Lead Location: 753860
Implantable Pulse Generator Implant Date: 20130729
Lead Channel Pacing Threshold Amplitude: 0.75 V
Lead Channel Pacing Threshold Pulse Width: 0.5 ms
Lead Channel Pacing Threshold Pulse Width: 0.5 ms
Lead Channel Setting Pacing Amplitude: 2.5 V
Lead Channel Setting Pacing Pulse Width: 0.5 ms
Lead Channel Setting Sensing Sensitivity: 0.5 mV
MDC IDC LEAD SERIAL: 322336
MDC IDC MSMT LEADCHNL RV IMPEDANCE VALUE: 700 Ohm
MDC IDC MSMT LEADCHNL RV PACING THRESHOLD AMPLITUDE: 0.75 V
MDC IDC MSMT LEADCHNL RV SENSING INTR AMPL: 12 mV
MDC IDC PG IMPLANT DT: 20170601
MDC IDC PG SERIAL: 1022442
MDC IDC SESS DTM: 20180525125734

## 2016-08-05 NOTE — Patient Instructions (Signed)
Your physician wants you to follow-up in: 1 Year with Dr. Lovena Le. You will receive a reminder letter in the mail two months in advance. If you don't receive a letter, please call our office to schedule the follow-up appointment.  Your physician recommends that you continue on your current medications as directed. Please refer to the Current Medication list given to you today.  Remote monitoring is used to monitor your Pacemaker of ICD from home. This monitoring reduces the number of office visits required to check your device to one time per year. It allows [place] to keep an eye on the functioning of your device to ensure it is working properly. You are scheduled for a device check from home on 11/07/16. You may send your transmission at any time that day. If you have a wireless device, the transmission will be sent automatically. After your physician reviews your transmission, you will receive a postcard with your next transmission date.  If you need a refill on your cardiac medications before your next appointment, please call your pharmacy.  Thank you for choosing [facility]!

## 2016-08-05 NOTE — Progress Notes (Signed)
HPI Melanie Cordova returns today for followup. She is a very pleasant 56 year old woman with an ischemic cardiomyopathy, chronic systolic heart failure, severe back pain, hypertension, and COPD. She denies shortness of breath. No ICD shock. No peripheral edema. She continues to smoke cigarettes. She is trying to lose weight. She had a TIA several months ago and we placed an ILR. No palpitations.  Allergies  Allergen Reactions  . Fish Allergy Anaphylaxis  . Iodinated Diagnostic Agents Hives and Other (See Comments)    Pulmonary problems; no frank respiratory arrest  . Iodine Hives and Other (See Comments)    Pulmonary Problems   . Lentil Anaphylaxis  . Penicillins Anaphylaxis and Shortness Of Breath    Has patient had a PCN reaction causing immediate rash, facial/tongue/throat swelling, SOB or lightheadedness with hypotension: Yes Has patient had a PCN reaction causing severe rash involving mucus membranes or skin necrosis: No Has patient had a PCN reaction that required hospitalization No Has patient had a PCN reaction occurring within the last 10 years: No If all of the above answers are "NO", then may proceed with Cephalosporin use.  Hair loss  . Lipitor [Atorvastatin] Itching and Rash  . Spironolactone Rash  . Sulfa Antibiotics Other (See Comments)    Unknown     Current Outpatient Prescriptions  Medication Sig Dispense Refill  . albuterol (PROVENTIL HFA;VENTOLIN HFA) 108 (90 BASE) MCG/ACT inhaler Inhale 2 puffs into the lungs every 6 (six) hours as needed for wheezing or shortness of breath. 1 Inhaler 5  . aspirin EC 81 MG tablet Take 81 mg by mouth 2 (two) times daily.     Marland Kitchen azithromycin (ZITHROMAX Z-PAK) 250 MG tablet Take 1 tablet (250 mg total) by mouth daily. 4 tablet 0  . benzonatate (TESSALON) 100 MG capsule Take 1 capsule (100 mg total) by mouth every 8 (eight) hours. 21 capsule 0  . budesonide-formoterol (SYMBICORT) 80-4.5 MCG/ACT inhaler Inhale 2 puffs into the lungs 2  (two) times daily.    . carvedilol (COREG) 12.5 MG tablet Take 25 mg by mouth 2 (two) times daily with a meal.    . cetirizine (ZYRTEC) 10 MG tablet Take 10 mg by mouth daily.    . clobetasol (TEMOVATE) 0.05 % external solution Apply 1 application topically 2 (two) times daily as needed (Apply to scalp.).     Marland Kitchen cyclobenzaprine (FLEXERIL) 5 MG tablet Take 1 tablet (5 mg total) by mouth 3 (three) times daily as needed. 30 tablet 0  . diclofenac sodium (VOLTAREN) 1 % GEL Apply 2 g topically 4 (four) times daily as needed (pain).     Marland Kitchen diphenhydrAMINE (BENADRYL) 25 mg capsule Take 25 mg by mouth every 8 (eight) hours as needed for allergies.     . Ferrous Sulfate (IRON) 28 MG TABS Take 1 tablet by mouth daily.    . fluticasone (CUTIVATE) 0.05 % cream Apply 1 application topically 2 (two) times daily.     . fluticasone (FLONASE) 50 MCG/ACT nasal spray Place 2 sprays into both nostrils daily as needed for allergies.     . furosemide (LASIX) 40 MG tablet Take 1 tablet (40 mg total) by mouth 2 (two) times daily. 180 tablet 0  . gabapentin (NEURONTIN) 800 MG tablet Take 800 mg by mouth 4 (four) times daily.    Marland Kitchen ketoconazole (NIZORAL) 2 % cream Apply 1 application topically 2 (two) times daily.     Marland Kitchen levothyroxine (SYNTHROID, LEVOTHROID) 200 MCG tablet Take 1 tablet (200 mcg total) by  mouth daily before breakfast. Takes with 57mcg for a total of 255mcg 90 tablet 2  . levothyroxine (SYNTHROID, LEVOTHROID) 25 MCG tablet Take 1 tablet (25 mcg total) by mouth daily before breakfast. Takes with 232mcg to equal 263mcg 90 tablet 2  . lisinopril (PRINIVIL,ZESTRIL) 10 MG tablet Take 1 tablet (10 mg total) by mouth daily. 90 tablet 0  . potassium chloride SA (K-DUR,KLOR-CON) 20 MEQ tablet Take 2 tablets (40 mEq total) by mouth 2 (two) times daily. 180 tablet 0  . predniSONE (STERAPRED UNI-PAK 21 TAB) 10 MG (21) TBPK tablet Take 6 tabs day 1, 5 tabs day 2, 4 tabs day 3, 3 tabs day 4, 2 tabs day 5, and 1 tab on day 6.  21 tablet 0  . ranitidine (ZANTAC) 150 MG tablet Take 150 mg by mouth at bedtime.    . rosuvastatin (CRESTOR) 20 MG tablet Take 20 mg by mouth daily.    . Topiramate ER (TROKENDI XR) 25 MG CP24 Take 1 capsule by mouth at bedtime.    . traMADol (ULTRAM) 50 MG tablet Take 1 tablet (50 mg total) by mouth every 6 (six) hours as needed. 15 tablet 0  . Vitamin D, Ergocalciferol, (DRISDOL) 50000 units CAPS capsule TAKE 1 CAPSULE BY MOUTH EVERY 7 DAYS 4 capsule 2   No current facility-administered medications for this visit.      Past Medical History:  Diagnosis Date  . Anxiety   . Arthritis   . Asthma   . Automatic implantable cardioverter-defibrillator in situ    2013, july  . Bell palsy    states has had 3 episodes  . Cardiomyopathy 08/2010   Presented with congestive heart failure; EF of 15% and 2012; hypotension on medication precludes optimal dosing  . CHF (congestive heart failure) ([place])   . Chronic systolic heart failure ([place])   . COPD (chronic obstructive pulmonary disease) ([place])    2013  . Dysrhythmia   . Gastroesophageal reflux disease   . Headache(784.0)   . Hyperlipidemia   . Hypertension    09/2010-normal CMet and CBC; Lipid profile-116, 88, 25, 73  . Hypothyroidism    Recent TSH was normal.  . 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 Neuromuscular disorder (HCC)    Neuropathy, right foot and leg  . Neuropathy   . Obesity   . Pneumonia   . Seizures ([place])    last one at age 64  . Shortness of breath   . Stroke [facility])     stroke in 07/2012 and another in June, 2014  . Tobacco abuse    20 pack years    ROS:   All systems reviewed and negative except as noted in the HPI.   Past Surgical History:  Procedure Laterality Date  . CESAREAN SECTION     X2  . COLONOSCOPY N/A 12/16/2013   Procedure: COLONOSCOPY;  Surgeon: Danie Binder, MD;  [facility];  Service: Endoscopy;  Laterality: N/A;  10:45-moved to 10/5 @ [place]  notified pt  . EP IMPLANTABLE DEVICE     St. Jude  . EP IMPLANTABLE DEVICE N/A 08/13/2015   Procedure: Loop Recorder Insertion;  Surgeon: Evans Lance, MD;  Location: [place] CV LAB;  Service: Cardiovascular;  Laterality: N/A;  . IMPLANTABLE CARDIOVERTER DEFIBRILLATOR IMPLANT N/A 10/10/2011   Procedure: IMPLANTABLE CARDIOVERTER DEFIBRILLATOR IMPLANT;  Surgeon: Evans Lance, MD;  Location: [facility] CATH LAB;  Service: Cardiovascular;  Laterality: N/A;  . MULTIPLE EXTRACTIONS  WITH ALVEOLOPLASTY N/A 09/24/2012   Procedure: MULTIPLE EXTRACION #2, 4, 6, 7 ,8, 9, 11, 13, 18, 20, 21, 22, 23, 24, 25, 26, 27, 29 WITH ALVEOLOPLASTY, BIOPSY OF PALATE LESION, REMOVA RIGHT LINGUAL TORUS;  Surgeon: Gae Bon, DDS;  Location: [place];  Service: Oral Surgery;  Laterality: N/A;  . TEE WITHOUT CARDIOVERSION N/A 07/11/2012   Procedure: TRANSESOPHAGEAL ECHOCARDIOGRAM (TEE);  Surgeon: Thayer Headings, MD;  Location: [facility] ENDOSCOPY;  Service: Cardiovascular;  Laterality: N/A;  . TUBAL LIGATION       Family History  Problem Relation Age of Onset  . Cardiomyopathy Mother        ICD pacemaker-ischmic CM  . Heart failure Mother   . Hypertension Mother   . Diabetes Mother   . Thyroid disease Mother   . Cardiomyopathy Father        Deceased  . Coronary artery disease Father   . Heart failure Father   . Heart failure Brother   . Hypertension Brother   . Endometriosis Sister   . Colon cancer Neg Hx   . Liver disease Neg Hx      Social History   Social History  . Marital status: Divorced    Spouse name: N/A  . Number of children: 2  . Years of education: N/A   Occupational History  .  [employer]    Works in [place] Topics  . Smoking status: Current Every Day Smoker    Packs/day: 0.50    Years: 21.00    Types: Cigarettes    Start date: 07/02/1983  . Smokeless tobacco: Never Used     Comment: 5 ciggs daily 11-26-12  . Alcohol use No  . Drug use: No  . Sexual  activity: No   Other Topics Concern  . Not on file   Social History Narrative  . No narrative on file     BP 104/66   Pulse (!) 101   Ht 5\' 7"  (1.702 m)   Wt 274 lb (124.3 kg)   SpO2 98%   BMI 42.91 kg/m   Physical Exam:  obese appearing middle aged woman, NAD HEENT: Unremarkable Neck:  No JVD, no thyromegally Back:  No CVA tenderness Lungs:  Clear with no wheezes HEART:  Regular rate rhythm, no murmurs, no rubs, no clicks Abd:  soft, positive bowel sounds, no organomegally, no rebound, no guarding Ext:  2 plus pulses, no edema, no cyanosis, no clubbing Skin:  No rashes no nodules Neuro:  CN II through XII intact, motor grossly intact   DEVICE  Normal device function.  See PaceArt for details.   Assess/Plan:  1. Chest pressure - this has resolved. Will follow. 2. Probable TIA's - she has had no additional symptoms. Her ILR is working normally and she has had no atrial fib. 3. HTN - she is encouraged to lose weight and continue her current meds. 4. Chronic systolic heart failure - her symptoms are class 2. She will continue her current meds. I have asked her to reduce her salt intake and she has. 5. Tobacco abuse - she is encouraged to stop smoking. 6. Her St. Jude single chamber ICD is working normally. Will follow.  Mikle Bosworth.D.

## 2016-08-09 ENCOUNTER — Ambulatory Visit (INDEPENDENT_AMBULATORY_CARE_PROVIDER_SITE_OTHER): Payer: Medicare Other | Admitting: *Deleted

## 2016-08-09 DIAGNOSIS — I639 Cerebral infarction, unspecified: Secondary | ICD-10-CM | POA: Diagnosis not present

## 2016-08-11 NOTE — Progress Notes (Signed)
Carelink Summary Report 

## 2016-08-12 LAB — CUP PACEART REMOTE DEVICE CHECK
Implantable Lead Implant Date: 20130729
Implantable Lead Location: 753860
Implantable Lead Model: 181
Implantable Lead Serial Number: 322336
Implantable Pulse Generator Implant Date: 20170601
MDC IDC SESS DTM: 20180527193854

## 2016-08-23 ENCOUNTER — Other Ambulatory Visit: Payer: Self-pay | Admitting: "Endocrinology

## 2016-08-23 LAB — TSH: TSH: 0.14 mIU/L — ABNORMAL LOW

## 2016-08-23 LAB — VITAMIN D 25 HYDROXY (VIT D DEFICIENCY, FRACTURES): VIT D 25 HYDROXY: 30 ng/mL (ref 30–100)

## 2016-08-23 LAB — T4, FREE: Free T4: 1.6 ng/dL (ref 0.8–1.8)

## 2016-08-24 LAB — HEMOGLOBIN A1C
Hgb A1c MFr Bld: 5.2 % (ref <5.7)
MEAN PLASMA GLUCOSE: 103 mg/dL

## 2016-08-31 ENCOUNTER — Ambulatory Visit (INDEPENDENT_AMBULATORY_CARE_PROVIDER_SITE_OTHER): Payer: Medicare Other | Admitting: "Endocrinology

## 2016-08-31 ENCOUNTER — Encounter: Payer: Self-pay | Admitting: "Endocrinology

## 2016-08-31 ENCOUNTER — Encounter: Payer: Medicare Other | Attending: Internal Medicine | Admitting: Nutrition

## 2016-08-31 VITALS — BP 121/78 | HR 76 | Ht 67.0 in | Wt 277.0 lb

## 2016-08-31 DIAGNOSIS — Z713 Dietary counseling and surveillance: Secondary | ICD-10-CM | POA: Diagnosis not present

## 2016-08-31 DIAGNOSIS — E559 Vitamin D deficiency, unspecified: Secondary | ICD-10-CM | POA: Diagnosis not present

## 2016-08-31 DIAGNOSIS — E038 Other specified hypothyroidism: Secondary | ICD-10-CM

## 2016-08-31 NOTE — Progress Notes (Signed)
Subjective:    Patient ID: Melanie Cordova, female    DOB: 1960-07-04, PCP Rosita Fire, MD   Past Medical History:  Diagnosis Date  . Anxiety   . Arthritis   . Asthma   . Automatic implantable cardioverter-defibrillator in situ    2013, july  . Bell palsy    states has had 3 episodes  . Cardiomyopathy 08/2010   Presented with congestive heart failure; EF of 15% and 2012; hypotension on medication precludes optimal dosing  . CHF (congestive heart failure) ([place])   . Chronic systolic heart failure ([place])   . COPD (chronic obstructive pulmonary disease) ([place])    2013  . Dysrhythmia   . Gastroesophageal reflux disease   . Headache(784.0)   . Hyperlipidemia   . Hypertension    09/2010-normal CMet and CBC; Lipid profile-116, 88, 25, 73  . Hypothyroidism    Recent TSH was normal.  . 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 Neuromuscular disorder (HCC)    Neuropathy, right foot and leg  . Neuropathy   . Obesity   . Pneumonia   . Seizures ([place])    last one at age 67  . Shortness of breath   . Stroke [facility])     stroke in 07/2012 and another in June, 2014  . Tobacco abuse    20 pack years   Past Surgical History:  Procedure Laterality Date  . CESAREAN SECTION     X2  . COLONOSCOPY N/A 12/16/2013   Procedure: COLONOSCOPY;  Surgeon: Danie Binder, MD;  [facility];  Service: Endoscopy;  Laterality: N/A;  10:45-moved to 10/5 @ [place] notified pt  . EP IMPLANTABLE DEVICE     St. Jude  . EP IMPLANTABLE DEVICE N/A 08/13/2015   Procedure: Loop Recorder Insertion;  Surgeon: Evans Lance, MD;  Location: [place] CV LAB;  Service: Cardiovascular;  Laterality: N/A;  . IMPLANTABLE CARDIOVERTER DEFIBRILLATOR IMPLANT N/A 10/10/2011   Procedure: IMPLANTABLE CARDIOVERTER DEFIBRILLATOR IMPLANT;  Surgeon: Evans Lance, MD;  Location: [facility] CATH LAB;  Service: Cardiovascular;  Laterality: N/A;  . MULTIPLE EXTRACTIONS WITH ALVEOLOPLASTY N/A  09/24/2012   Procedure: MULTIPLE EXTRACION #2, 4, 6, 7 ,8, 9, 11, 13, 18, 20, 21, 22, 23, 24, 25, 26, 27, 29 WITH ALVEOLOPLASTY, BIOPSY OF PALATE LESION, REMOVA RIGHT LINGUAL TORUS;  Surgeon: Gae Bon, DDS;  Location: [place];  Service: Oral Surgery;  Laterality: N/A;  . TEE WITHOUT CARDIOVERSION N/A 07/11/2012   Procedure: TRANSESOPHAGEAL ECHOCARDIOGRAM (TEE);  Surgeon: Thayer Headings, MD;  Location: [place];  Service: Cardiovascular;  Laterality: N/A;  . TUBAL LIGATION     Social History   Social History  . Marital status: Divorced    Spouse name: N/A  . Number of children: 2  . Years of education: N/A   Occupational History  .  [employer]    Works in [place] Topics  . Smoking status: Current Every Day Smoker    Packs/day: 0.50    Years: 21.00    Types: Cigarettes    Start date: 07/02/1983  . Smokeless tobacco: Never Used     Comment: 5 ciggs daily 11-26-12  . Alcohol use No  . Drug use: No  . Sexual activity: No   Other Topics Concern  . None   Social History Narrative  . None   Outpatient Encounter Prescriptions as of 08/31/2016  Medication Sig  .  albuterol (PROVENTIL HFA;VENTOLIN HFA) 108 (90 BASE) MCG/ACT inhaler Inhale 2 puffs into the lungs every 6 (six) hours as needed for wheezing or shortness of breath.  Marland Kitchen aspirin EC 81 MG tablet Take 81 mg by mouth 2 (two) times daily.   . budesonide-formoterol (SYMBICORT) 80-4.5 MCG/ACT inhaler Inhale 2 puffs into the lungs 2 (two) times daily.  . carvedilol (COREG) 12.5 MG tablet Take 25 mg by mouth 2 (two) times daily with a meal.  . cetirizine (ZYRTEC) 10 MG tablet Take 10 mg by mouth daily.  . clobetasol (TEMOVATE) 0.05 % external solution Apply 1 application topically 2 (two) times daily as needed (Apply to scalp.).   Marland Kitchen cyclobenzaprine (FLEXERIL) 5 MG tablet Take 1 tablet (5 mg total) by mouth 3 (three) times daily as needed.  . diclofenac sodium (VOLTAREN) 1 % GEL Apply 2 g  topically 4 (four) times daily as needed (pain).   Marland Kitchen diphenhydrAMINE (BENADRYL) 25 mg capsule Take 25 mg by mouth every 8 (eight) hours as needed for allergies.   . Ferrous Sulfate (IRON) 28 MG TABS Take 1 tablet by mouth daily.  . fluticasone (CUTIVATE) 0.05 % cream Apply 1 application topically 2 (two) times daily.   . fluticasone (FLONASE) 50 MCG/ACT nasal spray Place 2 sprays into both nostrils daily as needed for allergies.   . furosemide (LASIX) 40 MG tablet Take 1 tablet (40 mg total) by mouth 2 (two) times daily.  Marland Kitchen gabapentin (NEURONTIN) 800 MG tablet Take 800 mg by mouth 4 (four) times daily.  Marland Kitchen ketoconazole (NIZORAL) 2 % cream Apply 1 application topically 2 (two) times daily.   Marland Kitchen levothyroxine (SYNTHROID, LEVOTHROID) 200 MCG tablet Take 1 tablet (200 mcg total) by mouth daily before breakfast. Takes with 78mcg for a total of 223mcg  . lisinopril (PRINIVIL,ZESTRIL) 10 MG tablet Take 1 tablet (10 mg total) by mouth daily.  . potassium chloride SA (K-DUR,KLOR-CON) 20 MEQ tablet Take 2 tablets (40 mEq total) by mouth 2 (two) times daily.  . predniSONE (STERAPRED UNI-PAK 21 TAB) 10 MG (21) TBPK tablet Take 6 tabs day 1, 5 tabs day 2, 4 tabs day 3, 3 tabs day 4, 2 tabs day 5, and 1 tab on day 6.  . ranitidine (ZANTAC) 150 MG tablet Take 150 mg by mouth at bedtime.  . rosuvastatin (CRESTOR) 20 MG tablet Take 20 mg by mouth daily.  . Topiramate ER (TROKENDI XR) 25 MG CP24 Take 1 capsule by mouth at bedtime.  . traMADol (ULTRAM) 50 MG tablet Take 1 tablet (50 mg total) by mouth every 6 (six) hours as needed.  . Vitamin D, Ergocalciferol, (DRISDOL) 50000 units CAPS capsule TAKE 1 CAPSULE BY MOUTH EVERY 7 DAYS  . [DISCONTINUED] azithromycin (ZITHROMAX Z-PAK) 250 MG tablet Take 1 tablet (250 mg total) by mouth daily.  . [DISCONTINUED] benzonatate (TESSALON) 100 MG capsule Take 1 capsule (100 mg total) by mouth every 8 (eight) hours.  . [DISCONTINUED] levothyroxine (SYNTHROID, LEVOTHROID) 25 MCG  tablet Take 1 tablet (25 mcg total) by mouth daily before breakfast. Takes with 273mcg to equal 259mcg   No facility-administered encounter medications on file as of 08/31/2016.    ALLERGIES: Allergies  Allergen Reactions  . Fish Allergy Anaphylaxis  . Iodinated Diagnostic Agents Hives and Other (See Comments)    Pulmonary problems; no frank respiratory arrest  . Iodine Hives and Other (See Comments)    Pulmonary Problems   . Lentil Anaphylaxis  . Penicillins Anaphylaxis and Shortness Of Breath  Has patient had a PCN reaction causing immediate rash, facial/tongue/throat swelling, SOB or lightheadedness with hypotension: Yes Has patient had a PCN reaction causing severe rash involving mucus membranes or skin necrosis: No Has patient had a PCN reaction that required hospitalization No Has patient had a PCN reaction occurring within the last 10 years: No If all of the above answers are "NO", then may proceed with Cephalosporin use.  Hair loss  . Lipitor [Atorvastatin] Itching and Rash  . Spironolactone Rash  . Sulfa Antibiotics Other (See Comments)    Unknown   VACCINATION STATUS: Immunization History  Administered Date(s) Administered  . Pneumococcal Polysaccharide-23 09/28/2010, 11/18/2010    HPI  56 yr old female with medical hx as above. she is here to f/u for hypothyroidism. She decided not to pursue bariatric surgery. Last year, she has had 24 hour urine cortisol to r/o Cushings syndrome which was normal.  She is on Levothyroxine 225 mcg po qam. - She has had no success in further weight loss. - she denies palpitations, heat intolerance. she denies chest pain, SOB. she has family hx of thyroid dysfunction in her mother. she has AICD . she smokes 1/2 PPD ( 30 PY).  Review of Systems  Constitutional:  Recently reversing the weight loss she achieved last year , no fatigue, no subjective hyperthermia/hypothermia Eyes: no blurry vision, no xerophthalmia ENT: no sore  throat, no nodules palpated in throat, no dysphagia/odynophagia, no hoarseness Cardiovascular: no CP/SOB/palpitations/leg swelling Respiratory: no cough/SOB Gastrointestinal: no N/V/D/C Musculoskeletal: no muscle/joint aches Skin: no rashes Neurological: no tremors/numbness/tingling/dizziness Psychiatric: no depression/anxiety  Objective:    BP 121/78   Pulse 76   Ht 5\' 7"  (1.702 m)   Wt 277 lb (125.6 kg)   BMI 43.38 kg/m   Wt Readings from Last 3 Encounters:  08/31/16 278 lb (126.1 kg)  08/31/16 277 lb (125.6 kg)  08/05/16 274 lb (124.3 kg)    Physical Exam  Constitutional: obese, in NAD Eyes: PERRLA, EOMI, no exophthalmos ENT: moist mucous membranes, no thyromegaly, no cervical lymphadenopathy Cardiovascular: RRR, No MRG Respiratory: CTA B Gastrointestinal: abdomen soft, NT, ND, BS+ Musculoskeletal: no deformities, strength intact in all 4 Skin: moist, warm, no rashes Neurological: no tremor with outstretched hands, DTR normal in all 4  Diabetic Labs (most recent): Lab Results  Component Value Date   HGBA1C 5.2 08/23/2016   HGBA1C 5.7 (H) 07/26/2015   HGBA1C 5.7 (H) 04/29/2013       Component Value Date/Time   CHOL 166 02/19/2016 1013   TRIG 217 (H) 02/19/2016 1013   HDL 38 (L) 02/19/2016 1013   CHOLHDL 4.4 02/19/2016 1013   VLDL 43 (H) 02/19/2016 1013   LDLCALC 85 02/19/2016 1013    Results for SHANIE, MAUZY (MRN [id number]) as of 08/31/2016 12:00  Ref. Range 02/19/2016 10:13 04/09/2016 00:44 08/23/2016 10:14  TSH Latest Units: mIU/L 0.09 (L)  0.14 (L)  T4,Free(Direct) Latest Ref Range: 0.8 - 1.8 ng/dL 1.6  1.6    Assessment & Plan:   1. Hypothyroidism, unspecified hypothyroidism type -she has long standing hypothyroidism . - Her recent labs showed slight over replacement. I advised her to lower her levothyroxine to 100 g by mouth every morning.    - We discussed about correct intake of levothyroxine, at fasting, with water, separated by at least  30 minutes from breakfast, and separated by more than 4 hours from calcium, iron, multivitamins, acid reflux medications (PPIs). -Patient is made aware of the fact that thyroid hormone replacement  is needed for life, dose to be adjusted by periodic monitoring of thyroid function tests.  2. Morbid obesity due to excess calories ([place])   - she is given detailed carbs and exercise information. She does not have diabetes. Her 24 hour urine free cotisol is normal at 14.5, rules out Cushing's synrome.  She has seen  Jearld Fenton, CDE. Due to her legitimate concern on weight, I discussed bariatric surgery , however, she decided not to pursue that option.  3. Essential hypertension  -I advised her to continue her Lisinopril and carvedilol for HTN.  4. Hyperlipidemia - continue Crestor 20 mg by mouth daily at bedtime.   5. Vitamin D deficiency: She is on vitamin D supplement 50,000 units weekly.  6. Chronic heavy smoker:  She has COPD. She is  extensively counseled against smoking. He is not ready to quit.  - 25 minutes of time was spent on the care of this patient , 50% of which was applied for counseling on prevention of  Diabetes. - I advised patient to maintain close follow up with Rosita Fire, MD for primary care needs. Follow up plan: Return in about 6 months (around 03/02/2017) for follow up with pre-visit labs.  Glade Lloyd, MD Phone: [phone]  Fax: [phone]   08/31/2016, 11:05 AM

## 2016-08-31 NOTE — Patient Instructions (Addendum)
Goals Increase physical activity 30 minutes 4 times per week. Cut out sugar in tea and coffee Cut out sweet tea. Lose 3-5 lbs per month. Avoid processed foods and high salt foods Quit smoking by Christmas.

## 2016-08-31 NOTE — Progress Notes (Signed)
  Medical Nutrition Therapy:  Appt start time: 1100  end time: 1115 Assessment:  Primary concerns today: Obesity and prediabetes follow up.   Wt up 4 lbs. A1C down to 5.2% from 5.7%. Says she is working on quitting smoking.. Smoking 1/2 pack per day. Wants to quit by Christmas.  Limited access to the [facility] or [facility] due to no transportation. Drinks a lot of sweet tea. Walks a little but not 150 minutes per week. Diet is excessive in calories restricting weight loss.       Lab Results  Component Value Date   HGBA1C 5.2 08/23/2016   Wt Readings from Last 3 Encounters:  08/31/16 278 lb (126.1 kg)  08/05/16 274 lb (124.3 kg)  03/02/16 274 lb (124.3 kg)   Ht Readings from Last 3 Encounters:  08/05/16 5\' 7"  (1.702 m)  03/02/16 5' 7.5" (1.715 m)  02/26/16 5' 7.5" (1.715 m)   Lipid Panel     Component Value Date/Time   CHOL 166 02/19/2016 1013   TRIG 217 (H) 02/19/2016 1013   HDL 38 (L) 02/19/2016 1013   CHOLHDL 4.4 02/19/2016 1013   VLDL 43 (H) 02/19/2016 1013   LDLCALC 85 02/19/2016 1013   Preferred Learning Style:     No preference indicated   Learning Readiness:    Ready  Change in progress   MEDICATIONS: see list   DIETARY INTAKE:  24-hr recall:  B ( AM): Multi grain cherrios with LF milk, apple  L ( PM): [place] breast on wheat bread, mustard, cheese, and toss salad with raspberry vineginarette,  Sweet Tea  Snk ( PM):  D ( PM): Meat loaf, green beans and mashed potatoes and sweet teaa bverages: water,   Usual physical activity: walking 30 minutes twice a week .   Estimated energy needs: 1600 calories 180 g carbohydrates 120 g protein 44 g fat  Progress Towards Goal(s):  In progress.   Nutritional Diagnosis:  NB-1.1 Food and nutrition-related knowledge deficit As related to Obesity.  As evidenced by BMI > 40.    Intervention:  Nutrition counseling on healthy weight loss tips, ways to prevent diabetes and importance of exercise. Low sodium, low  fat, high fiber diet and meal planning and exercises for needed weight loss. High Fiber low fat diet  Goals 1. Increase physical to to 60 minutes 4 times per week 2. Lose 1 lbs per week or 4 lbs per month Continue to work on quitting smoking.  Teaching Method Utilized:  Visual Auditory Hands on  Handouts given during visit include: Healthy Weight loss tips Chair exercises  Barriers to learning/adherence to lifestyle change: none  Demonstrated degree of understanding via:  Teach Back   Monitoring/Evaluation:  Dietary intake, exercise, meal planning, and body weight in 6 month(s).

## 2016-08-31 NOTE — Patient Instructions (Signed)
Advice for weight management -For most of us the best way to lose weight is by diet management. Generally speaking, diet management means restricting carbohydrate consumption to minimum possible (and to unprocessed or minimally processed complex starch) and increasing protein intake (animal or plant source), fruits, and vegetables.  -Sticking to a routine mealtime to eat 3 meals a day and avoiding unnecessary snacks is shown to have a big role in weight control.  -It is better to avoid simple carbohydrates including: Cakes, Desserts, Ice Cream, Soda (diet and regular), Sweet Tea, Candies, Chips, Cookies, Artificial Sweeteners, and "Sugar-free" Products.   -Exercise: 30 minutes a day 3-4 days a week, or 150 minutes a week. Combine stretch, strength, and aerobic activities. You may seek evaluation by your heart doctor prior to initiating exercise if you have high risk for heart disease.    

## 2016-09-06 ENCOUNTER — Ambulatory Visit (INDEPENDENT_AMBULATORY_CARE_PROVIDER_SITE_OTHER): Payer: Medicare Other | Admitting: *Deleted

## 2016-09-06 DIAGNOSIS — I639 Cerebral infarction, unspecified: Secondary | ICD-10-CM | POA: Diagnosis not present

## 2016-09-07 ENCOUNTER — Other Ambulatory Visit: Payer: Self-pay | Admitting: "Endocrinology

## 2016-09-07 NOTE — Progress Notes (Signed)
Carelink Summary Report / Loop Recorder 

## 2016-09-13 ENCOUNTER — Other Ambulatory Visit (HOSPITAL_COMMUNITY): Payer: Self-pay | Admitting: Internal Medicine

## 2016-09-13 DIAGNOSIS — Z1231 Encounter for screening mammogram for malignant neoplasm of breast: Secondary | ICD-10-CM

## 2016-09-18 LAB — CUP PACEART REMOTE DEVICE CHECK
Date Time Interrogation Session: 20180626194112
Implantable Lead Implant Date: 20130729
Implantable Lead Location: 753860
Implantable Lead Model: 181
Implantable Lead Serial Number: 322336
MDC IDC PG IMPLANT DT: 20170601

## 2016-09-18 NOTE — Progress Notes (Signed)
Carelink summary report received. Battery status OK. Normal device function. No new symptom episodes, tachy episodes, brady, or pause episodes. No new AF episodes. Monthly summary reports and ROV/PRN 

## 2016-10-03 ENCOUNTER — Encounter: Payer: Self-pay | Admitting: *Deleted

## 2016-10-04 ENCOUNTER — Ambulatory Visit (INDEPENDENT_AMBULATORY_CARE_PROVIDER_SITE_OTHER): Payer: Medicare Other | Admitting: Adult Health

## 2016-10-04 ENCOUNTER — Encounter: Payer: Self-pay | Admitting: Adult Health

## 2016-10-04 VITALS — BP 110/60 | HR 68 | Ht 67.5 in | Wt 277.5 lb

## 2016-10-04 DIAGNOSIS — Z1211 Encounter for screening for malignant neoplasm of colon: Secondary | ICD-10-CM | POA: Diagnosis not present

## 2016-10-04 DIAGNOSIS — Z1212 Encounter for screening for malignant neoplasm of rectum: Secondary | ICD-10-CM

## 2016-10-04 DIAGNOSIS — N816 Rectocele: Secondary | ICD-10-CM | POA: Diagnosis not present

## 2016-10-04 DIAGNOSIS — N898 Other specified noninflammatory disorders of vagina: Secondary | ICD-10-CM | POA: Diagnosis not present

## 2016-10-04 DIAGNOSIS — R232 Flushing: Secondary | ICD-10-CM

## 2016-10-04 HISTORY — DX: Flushing: R23.2

## 2016-10-04 LAB — HEMOCCULT GUIAC POC 1CARD (OFFICE): Fecal Occult Blood, POC: NEGATIVE

## 2016-10-04 LAB — POCT WET PREP (WET MOUNT): WBC, Wet Prep HPF POC: POSITIVE

## 2016-10-04 NOTE — Progress Notes (Signed)
Subjective:     Patient ID: Melanie Cordova, female   DOB: May 04, 1960, 56 y.o.   MRN: [id number]  HPI Melanie Cordova is a 56 year old white female in complaining of vaginal discharge and pressure and wipes blood at times and has hot flashes. Last pap in 2016 was ASCUS with negative HPV, has not had follow up pap. Has physical with [name]. PCP is [name].   Review of Systems Vaginal discharge Vaginal pressure Wipes blood at times Hot flashes No sex in 5 years No period in years   Reviewed past medical,surgical, social and family history. Reviewed medications and allergies.     Objective:   Physical Exam BP 110/60 (BP Location: Left Arm, Patient Position: Sitting, Cuff Size: Large)   Pulse 68   Ht 5' 7.5" (1.715 m)   Wt 277 lb 8 oz (125.9 kg)   BMI 42.82 kg/m    Skin warm and dry.Pelvic: external genitalia is normal in appearance no lesions, vagina: white discharge without odor,urethra has no lesions or masses noted, cervix:smooth and bulbous, uterus: normal size, shape and contour, non tender, no masses felt, adnexa: no masses or tenderness noted. Bladder is non tender and no masses felt. Wet prep:  +WBCs.On rectal exam has good tone, no polyps and hemoccult was negative, +rectocele. PHQ 9 score 16, she denies any suicidal ideations and declines meds, she says it is just good and bad days. She is nit candidate for HRT, due to stroke and cardiac issues,discussed SSRI and she declines.   Assessment:     1. Vaginal discharge   2. Rectocele   3. Hot flashes   4. Screening for colorectal cancer       Plan:     Pap in 2 weeks Review medical explainer #4

## 2016-10-06 ENCOUNTER — Ambulatory Visit (INDEPENDENT_AMBULATORY_CARE_PROVIDER_SITE_OTHER): Payer: Medicare Other | Admitting: *Deleted

## 2016-10-06 DIAGNOSIS — I639 Cerebral infarction, unspecified: Secondary | ICD-10-CM

## 2016-10-07 NOTE — Progress Notes (Signed)
Carelink Summary Report / Loop Recorder 

## 2016-10-12 ENCOUNTER — Ambulatory Visit (HOSPITAL_COMMUNITY)
Admission: RE | Admit: 2016-10-12 | Discharge: 2016-10-12 | Disposition: A | Discharge status: Home / Self Care | Payer: Medicare Other | Source: Ambulatory Visit | Attending: Internal Medicine | Admitting: Internal Medicine

## 2016-10-12 DIAGNOSIS — Z1231 Encounter for screening mammogram for malignant neoplasm of breast: Secondary | ICD-10-CM | POA: Insufficient documentation

## 2016-10-18 ENCOUNTER — Encounter: Payer: Self-pay | Admitting: Adult Health

## 2016-10-18 ENCOUNTER — Ambulatory Visit (INDEPENDENT_AMBULATORY_CARE_PROVIDER_SITE_OTHER): Payer: Medicare Other | Admitting: Adult Health

## 2016-10-18 ENCOUNTER — Other Ambulatory Visit (HOSPITAL_COMMUNITY)
Admission: RE | Admit: 2016-10-18 | Discharge: 2016-10-18 | Disposition: A | Discharge status: Home / Self Care | Payer: Medicare Other | Source: Ambulatory Visit | Attending: Adult Health | Admitting: Adult Health

## 2016-10-18 ENCOUNTER — Ambulatory Visit: Payer: Medicare Other | Admitting: Nutrition

## 2016-10-18 VITALS — BP 110/80 | HR 74 | Ht 67.2 in | Wt 275.0 lb

## 2016-10-18 DIAGNOSIS — Z01419 Encounter for gynecological examination (general) (routine) without abnormal findings: Secondary | ICD-10-CM

## 2016-10-18 DIAGNOSIS — Z124 Encounter for screening for malignant neoplasm of cervix: Secondary | ICD-10-CM | POA: Diagnosis not present

## 2016-10-18 NOTE — Progress Notes (Signed)
Patient ID: Melanie Cordova, female   DOB: 10-22-1960, 56 y.o.   MRN: [id number] History of Present Illness: Melanie Cordova is a 56 year old white female, in for well woman gyn exam and pap. PCP is [name].   Current Medications, Allergies, Past Medical History, Past Surgical History, Family History and Social History were reviewed in [employer] record.     Review of Systems: Patient denies any headaches, hearing loss, fatigue, blurred vision, shortness of breath, chest pain, abdominal pain, problems with bowel movements, urination, or intercourse(not having sex). No joint pain or mood swings.+hot flashes    Physical Exam:BP 110/80 (BP Location: Left Arm, Patient Position: Sitting, Cuff Size: Large)   Pulse 74   Ht 5' 7.2" (1.707 m)   Wt 275 lb (124.7 kg)   BMI 42.82 kg/m  General:  Well developed, well nourished, no acute distress Skin:  Warm and dry Neck:  Midline trachea, normal thyroid, good ROM, no lymphadenopathy Lungs; Clear to auscultation bilaterally Breast:  No dominant palpable mass, retraction, or nipple discharge Cardiovascular: Regular rate and rhythm Abdomen:  Soft, non tender, no hepatosplenomegaly Pelvic:  External genitalia is normal in appearance, no lesions.  The vagina is normal in appearance. Urethra has no lesions or masses. The cervix is bulbous.  Uterus is felt to be normal size, shape, and contour.  No adnexal masses or tenderness noted.Bladder is non tender, no masses felt. Rectal: Deferred today was done 10/04/16. Extremities/musculoskeletal:  No swelling or varicosities noted, no clubbing or cyanosis Psych:  No mood changes, alert and cooperative,seems happy   Impression: 1. Encounter for gynecological examination with Papanicolaou smear of cervix       Plan: Physical in 1 year Pap in 3 if normal Mammogram yearly

## 2016-10-20 LAB — CYTOLOGY - PAP
ADEQUACY: ABSENT
DIAGNOSIS: NEGATIVE
HPV (WINDOPATH): NOT DETECTED

## 2016-10-20 LAB — CUP PACEART REMOTE DEVICE CHECK
Implantable Lead Implant Date: 20130729
Implantable Lead Location: 753860
Implantable Pulse Generator Implant Date: 20170601
MDC IDC LEAD SERIAL: 322336
MDC IDC SESS DTM: 20180726201046

## 2016-10-26 ENCOUNTER — Encounter: Payer: Medicare Other | Attending: "Endocrinology | Admitting: Nutrition

## 2016-10-26 VITALS — Ht 67.5 in | Wt 275.0 lb

## 2016-10-26 DIAGNOSIS — E669 Obesity, unspecified: Secondary | ICD-10-CM

## 2016-10-26 DIAGNOSIS — R739 Hyperglycemia, unspecified: Secondary | ICD-10-CM

## 2016-10-26 NOTE — Patient Instructions (Addendum)
Goals Increase physical to 30 minutes 3-4 times per week. Cut out [place] sausage at breakfast Cut  Out all juice Increase low carb vegetables Lose 2-3 lbs per month; 10 lbs in the next 3 months

## 2016-10-26 NOTE — Progress Notes (Signed)
  Medical Nutrition Therapy:  Appt start time: 1100  end time: 1115 Assessment:  Primary concerns today: Obesity and prediabetes follow up.   Wt down 2 lbs. A1C down to 5.2% from 5.7%. Still smokes 1/2 ppd. She notes she is trying to walk in the mornings.more. Diet still low in low carb vegetables and not exercising enough for needed weight loss. Needs to reduce calories for weight loss.       Lab Results  Component Value Date   HGBA1C 5.2 08/23/2016   Wt Readings from Last 3 Encounters:  10/26/16 275 lb (124.7 kg)  10/18/16 275 lb (124.7 kg)  10/04/16 277 lb 8 oz (125.9 kg)   Ht Readings from Last 3 Encounters:  10/26/16 5' 7.5" (1.715 m)  10/18/16 5' 7.2" (1.707 m)  10/04/16 5' 7.5" (1.715 m)   Lipid Panel     Component Value Date/Time   CHOL 166 02/19/2016 1013   TRIG 217 (H) 02/19/2016 1013   HDL 38 (L) 02/19/2016 1013   CHOLHDL 4.4 02/19/2016 1013   VLDL 43 (H) 02/19/2016 1013   LDLCALC 85 02/19/2016 1013   Preferred Learning Style:     No preference indicated   Learning Readiness:    Ready  Change in progress   MEDICATIONS: see list   DIETARY INTAKE:  24-hr recall:  B ( AM): Boiled egg and fruit. Wheat toast and [place] sausage, 8 oz juice  L ( PM):  Sirloin steak, turnip greens, pasta, Sweet teaSnk ( PM):  D ( PM): Hamburger on bun, l/o/t, fruit, water  Usual physical activity: walking 30 minutes twice a week .   Estimated energy needs: 1600 calories 180 g carbohydrates 120 g protein 44 g fat  Progress Towards Goal(s):  In progress.   Nutritional Diagnosis:  NB-1.1 Food and nutrition-related knowledge deficit As related to Obesity.  As evidenced by BMI > 40.    Intervention:  Nutrition counseling on healthy weight loss tips, ways to prevent diabetes and importance of exercise. Low sodium, low fat, high fiber diet and meal planning and exercises for needed weight loss. High Fiber low fat diet   Goals Increase physical to 30 minutes 3-4  times per week. Cut out [place] sausage at breakfast Cut  Out all juice Increase low carb vegetables Lose 2-3 lbs per month; 10 lbs in the next 3 months   Teaching Method Utilized:  Visual Auditory Hands on  Handouts given during visit include: Healthy Weight loss tips Chair exercises  Barriers to learning/adherence to lifestyle change: none  Demonstrated degree of understanding via:  Teach Back   Monitoring/Evaluation:  Dietary intake, exercise, meal planning, and body weight in 3  month(s).

## 2016-11-07 ENCOUNTER — Ambulatory Visit (INDEPENDENT_AMBULATORY_CARE_PROVIDER_SITE_OTHER): Payer: Medicare Other | Admitting: *Deleted

## 2016-11-07 DIAGNOSIS — I639 Cerebral infarction, unspecified: Secondary | ICD-10-CM

## 2016-11-07 DIAGNOSIS — I429 Cardiomyopathy, unspecified: Secondary | ICD-10-CM | POA: Diagnosis not present

## 2016-11-07 NOTE — Progress Notes (Signed)
Carelink Summary Report / Loop Recorder 

## 2016-11-07 NOTE — Progress Notes (Signed)
Remote ICD transmission.   

## 2016-11-15 LAB — CUP PACEART REMOTE DEVICE CHECK
Implantable Lead Implant Date: 20130729
Implantable Lead Model: 181
Implantable Lead Serial Number: 322336
MDC IDC LEAD LOCATION: 753860
MDC IDC PG IMPLANT DT: 20170601
MDC IDC SESS DTM: 20180825201204

## 2016-11-18 ENCOUNTER — Encounter: Payer: Self-pay | Admitting: Cardiology

## 2016-11-25 LAB — CUP PACEART REMOTE DEVICE CHECK
Implantable Pulse Generator Implant Date: 20130729
MDC IDC LEAD IMPLANT DT: 20130729
MDC IDC LEAD LOCATION: 753860
MDC IDC LEAD SERIAL: 322336
MDC IDC SESS DTM: 20180914075326
Pulse Gen Serial Number: 1022442

## 2016-12-05 ENCOUNTER — Ambulatory Visit (INDEPENDENT_AMBULATORY_CARE_PROVIDER_SITE_OTHER): Payer: Medicare Other | Admitting: *Deleted

## 2016-12-05 DIAGNOSIS — I639 Cerebral infarction, unspecified: Secondary | ICD-10-CM | POA: Diagnosis not present

## 2016-12-06 LAB — CUP PACEART REMOTE DEVICE CHECK
Implantable Lead Implant Date: 20130729
Implantable Lead Model: 181
Implantable Lead Serial Number: 322336
Implantable Pulse Generator Implant Date: 20170601
MDC IDC LEAD LOCATION: 753860
MDC IDC SESS DTM: 20180924203856

## 2016-12-06 NOTE — Progress Notes (Signed)
Carelink Summary Report / Loop Recorder 

## 2017-01-04 ENCOUNTER — Ambulatory Visit (INDEPENDENT_AMBULATORY_CARE_PROVIDER_SITE_OTHER): Payer: Medicare Other | Admitting: *Deleted

## 2017-01-04 DIAGNOSIS — I639 Cerebral infarction, unspecified: Secondary | ICD-10-CM

## 2017-01-05 NOTE — Progress Notes (Signed)
Carelink Summary Report / Loop Recorder 

## 2017-01-10 LAB — CUP PACEART REMOTE DEVICE CHECK
Date Time Interrogation Session: 20181024203846
Implantable Lead Implant Date: 20130729
Implantable Pulse Generator Implant Date: 20170601
MDC IDC LEAD LOCATION: 753860
MDC IDC LEAD SERIAL: 322336

## 2017-02-06 ENCOUNTER — Ambulatory Visit (INDEPENDENT_AMBULATORY_CARE_PROVIDER_SITE_OTHER): Payer: Medicare Other | Admitting: *Deleted

## 2017-02-06 ENCOUNTER — Ambulatory Visit: Payer: Medicare Other | Admitting: *Deleted

## 2017-02-06 DIAGNOSIS — I639 Cerebral infarction, unspecified: Secondary | ICD-10-CM

## 2017-02-07 NOTE — Progress Notes (Signed)
Remote ICD transmission.   

## 2017-02-07 NOTE — Progress Notes (Signed)
Carelink Summary Report / Loop Recorder 

## 2017-02-10 ENCOUNTER — Encounter: Payer: Self-pay | Admitting: Cardiology

## 2017-02-19 LAB — CUP PACEART REMOTE DEVICE CHECK
Battery Remaining Longevity: 58 mo
Battery Remaining Percentage: 54 %
Battery Voltage: 2.9 V
Brady Statistic RV Percent Paced: 1 %
HIGH POWER IMPEDANCE MEASURED VALUE: 87 Ohm
HIGH POWER IMPEDANCE MEASURED VALUE: 87 Ohm
Implantable Lead Serial Number: 322336
Lead Channel Impedance Value: 640 Ohm
Lead Channel Pacing Threshold Amplitude: 0.75 V
Lead Channel Sensing Intrinsic Amplitude: 12 mV
MDC IDC LEAD IMPLANT DT: 20130729
MDC IDC LEAD LOCATION: 753860
MDC IDC MSMT LEADCHNL RV PACING THRESHOLD PULSEWIDTH: 0.5 ms
MDC IDC PG IMPLANT DT: 20130729
MDC IDC SESS DTM: 20181126070033
MDC IDC SET LEADCHNL RV PACING AMPLITUDE: 2.5 V
MDC IDC SET LEADCHNL RV PACING PULSEWIDTH: 0.5 ms
MDC IDC SET LEADCHNL RV SENSING SENSITIVITY: 0.5 mV
Pulse Gen Serial Number: 1022442

## 2017-02-20 LAB — CUP PACEART REMOTE DEVICE CHECK
MDC IDC LEAD IMPLANT DT: 20130729
MDC IDC LEAD LOCATION: 753860
MDC IDC LEAD SERIAL: 322336
MDC IDC PG IMPLANT DT: 20170601
MDC IDC SESS DTM: 20181123211020

## 2017-02-23 LAB — TSH: TSH: 15.61 m[IU]/L — AB

## 2017-02-23 LAB — T4, FREE: Free T4: 1 ng/dL (ref 0.8–1.8)

## 2017-03-01 ENCOUNTER — Other Ambulatory Visit: Payer: Self-pay | Admitting: Internal Medicine

## 2017-03-02 ENCOUNTER — Ambulatory Visit (INDEPENDENT_AMBULATORY_CARE_PROVIDER_SITE_OTHER): Payer: Medicare Other | Admitting: "Endocrinology

## 2017-03-02 ENCOUNTER — Encounter: Payer: Medicare Other | Attending: Internal Medicine | Admitting: Nutrition

## 2017-03-02 ENCOUNTER — Encounter: Payer: Self-pay | Admitting: "Endocrinology

## 2017-03-02 VITALS — BP 107/70 | HR 82 | Ht 67.2 in | Wt 285.0 lb

## 2017-03-02 DIAGNOSIS — E118 Type 2 diabetes mellitus with unspecified complications: Secondary | ICD-10-CM

## 2017-03-02 DIAGNOSIS — E785 Hyperlipidemia, unspecified: Secondary | ICD-10-CM | POA: Diagnosis not present

## 2017-03-02 DIAGNOSIS — Z882 Allergy status to sulfonamides status: Secondary | ICD-10-CM | POA: Insufficient documentation

## 2017-03-02 DIAGNOSIS — Z6841 Body Mass Index (BMI) 40.0 and over, adult: Secondary | ICD-10-CM | POA: Insufficient documentation

## 2017-03-02 DIAGNOSIS — Z880 Allergy status to penicillin: Secondary | ICD-10-CM | POA: Insufficient documentation

## 2017-03-02 DIAGNOSIS — E669 Obesity, unspecified: Secondary | ICD-10-CM

## 2017-03-02 DIAGNOSIS — E1165 Type 2 diabetes mellitus with hyperglycemia: Secondary | ICD-10-CM

## 2017-03-02 DIAGNOSIS — E559 Vitamin D deficiency, unspecified: Secondary | ICD-10-CM | POA: Diagnosis not present

## 2017-03-02 DIAGNOSIS — Z713 Dietary counseling and surveillance: Secondary | ICD-10-CM | POA: Insufficient documentation

## 2017-03-02 DIAGNOSIS — Z91013 Allergy to seafood: Secondary | ICD-10-CM | POA: Insufficient documentation

## 2017-03-02 DIAGNOSIS — E039 Hypothyroidism, unspecified: Secondary | ICD-10-CM | POA: Diagnosis not present

## 2017-03-02 DIAGNOSIS — I10 Essential (primary) hypertension: Secondary | ICD-10-CM | POA: Diagnosis not present

## 2017-03-02 DIAGNOSIS — E038 Other specified hypothyroidism: Secondary | ICD-10-CM

## 2017-03-02 DIAGNOSIS — IMO0002 Reserved for concepts with insufficient information to code with codable children: Secondary | ICD-10-CM

## 2017-03-02 DIAGNOSIS — Z79890 Hormone replacement therapy (postmenopausal): Secondary | ICD-10-CM | POA: Diagnosis not present

## 2017-03-02 DIAGNOSIS — J449 Chronic obstructive pulmonary disease, unspecified: Secondary | ICD-10-CM | POA: Diagnosis not present

## 2017-03-02 DIAGNOSIS — Z888 Allergy status to other drugs, medicaments and biological substances status: Secondary | ICD-10-CM | POA: Insufficient documentation

## 2017-03-02 DIAGNOSIS — Z8673 Personal history of transient ischemic attack (TIA), and cerebral infarction without residual deficits: Secondary | ICD-10-CM | POA: Diagnosis not present

## 2017-03-02 DIAGNOSIS — Z7951 Long term (current) use of inhaled steroids: Secondary | ICD-10-CM | POA: Diagnosis not present

## 2017-03-02 DIAGNOSIS — Z91041 Radiographic dye allergy status: Secondary | ICD-10-CM | POA: Insufficient documentation

## 2017-03-02 DIAGNOSIS — Z95810 Presence of automatic (implantable) cardiac defibrillator: Secondary | ICD-10-CM | POA: Insufficient documentation

## 2017-03-02 DIAGNOSIS — Z79899 Other long term (current) drug therapy: Secondary | ICD-10-CM | POA: Insufficient documentation

## 2017-03-02 DIAGNOSIS — F17210 Nicotine dependence, cigarettes, uncomplicated: Secondary | ICD-10-CM | POA: Insufficient documentation

## 2017-03-02 MED ORDER — LEVOTHYROXINE SODIUM 25 MCG PO TABS: 25.0000 ug | ORAL_TABLET | Freq: Every day | ORAL | 6 refills | Status: DC

## 2017-03-02 MED ORDER — VITAMIN D3 125 MCG (5000 UT) PO CAPS: 5000.0000 [IU] | ORAL_CAPSULE | Freq: Every day | ORAL | 0 refills | Status: DC

## 2017-03-02 NOTE — Progress Notes (Signed)
  Medical Nutrition Therapy:  Appt start time: 0930  time: 1000 Assessment:  Primary concerns today: Obesity and prediabetes follow up.   Wt up due to thryoid meds. Sees Dr. [name] for Endocrinology.  She notes she has cut out juices, watching what she is eating bette and was advised she hasn't lost any weight due to her thyroid meds. Walking some but needs to do more.      Lab Results  Component Value Date   HGBA1C 5.2 08/23/2016   Wt Readings from Last 3 Encounters:  03/02/17 285 lb (129.3 kg)  10/26/16 275 lb (124.7 kg)  10/18/16 275 lb (124.7 kg)   Ht Readings from Last 3 Encounters:  03/02/17 5' 7.2" (1.707 m)  10/26/16 5' 7.5" (1.715 m)  10/18/16 5' 7.2" (1.707 m)   Lipid Panel     Component Value Date/Time   CHOL 166 02/19/2016 1013   TRIG 217 (H) 02/19/2016 1013   HDL 38 (L) 02/19/2016 1013   CHOLHDL 4.4 02/19/2016 1013   VLDL 43 (H) 02/19/2016 1013   LDLCALC 85 02/19/2016 1013   Preferred Learning Style:     No preference indicated   Learning Readiness:    Ready  Change in progress   MEDICATIONS: see list   DIETARY INTAKE:  24-hr recall:  B ( AM):boiled egg, fruit, LS sausage/bacon  L ( PM):   D ( PM): Hamburger on bun, l/o/t, fruit, water  Usual physical activity: walking 30 minutes twice a week .   Estimated energy needs: 1600 calories 180 g carbohydrates 120 g protein 44 g fat  Progress Towards Goal(s):  In progress.   Nutritional Diagnosis:  NB-1.1 Food and nutrition-related knowledge deficit As related to Obesity.  As evidenced by BMI > 40.    Intervention:  Nutrition counseling on healthy weight loss tips, ways to prevent diabetes and importance of exercise. Low sodium, low fat, high fiber diet and meal planning and exercises for needed weight loss. High Fiber low fat diet   Goals 1 Increase fiber with oatmeal with breakfast Cut out bacon and sausage. 2. Keep drinking water 3. Walk 45-mins  3--4 times per week Resume thyroid  meds.   Teaching Method Utilized:  Visual Auditory Hands on  Handouts given during visit include: Healthy Weight loss tips Chair exercises  Barriers to learning/adherence to lifestyle change: none  Demonstrated degree of understanding via:  Teach Back   Monitoring/Evaluation:  Dietary intake, exercise, meal planning, and body weight in 3  month(s).

## 2017-03-02 NOTE — Patient Instructions (Signed)
Goals 1 Increase fiber with oatmeal with breakfast Cut out bacon and sausage. 2. Keep drinking water 3. Walk 45-mins  3--4 times per week Resume thyroid meds.

## 2017-03-02 NOTE — Progress Notes (Signed)
Subjective:    Patient ID: Melanie Cordova, female    DOB: [birth year], PCP Rosita Fire, MD   Past Medical History:  Diagnosis Date  . Anxiety   . Arthritis   . Asthma   . Automatic implantable cardioverter-defibrillator in situ    2013, july  . Bell palsy    states has had 3 episodes  . Cardiomyopathy 08/2010   Presented with congestive heart failure; EF of 15% and 2012; hypotension on medication precludes optimal dosing  . CHF (congestive heart failure) ([place])   . Chronic systolic heart failure ([place])   . COPD (chronic obstructive pulmonary disease) ([place])    2013  . Dysrhythmia   . Gastroesophageal reflux disease   . Headache(784.0)   . Hot flashes 10/04/2016  . Hyperlipidemia   . Hypertension    09/2010-normal CMet and CBC; Lipid profile-116, 88, 25, 73  . Hypothyroidism    Recent TSH was normal.  . 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 Neuromuscular disorder (HCC)    Neuropathy, right foot and leg  . Neuropathy   . Obesity   . Pneumonia   . Seizures ([place])    last one at age 19  . Shortness of breath   . Stroke [facility])     stroke in 07/2012 and another in June, 2014  . Tobacco abuse    20 pack years   Past Surgical History:  Procedure Laterality Date  . CESAREAN SECTION     X2  . COLONOSCOPY N/A 12/16/2013   Procedure: COLONOSCOPY;  Surgeon: Danie Binder, MD;  Location: [facility];  Service: Endoscopy;  Laterality: N/A;  10:45-moved to 10/5 @ [place] notified pt  . EP IMPLANTABLE DEVICE     St. Jude  . EP IMPLANTABLE DEVICE N/A 08/13/2015   Procedure: Loop Recorder Insertion;  Surgeon: Evans Lance, MD;  Location: [place] CV LAB;  Service: Cardiovascular;  Laterality: N/A;  . IMPLANTABLE CARDIOVERTER DEFIBRILLATOR IMPLANT N/A 10/10/2011   Procedure: IMPLANTABLE CARDIOVERTER DEFIBRILLATOR IMPLANT;  Surgeon: Evans Lance, MD;  Location: [facility] CATH LAB;  Service: Cardiovascular;  Laterality: N/A;  . MULTIPLE EXTRACTIONS WITH  ALVEOLOPLASTY N/A 09/24/2012   Procedure: MULTIPLE EXTRACION #2, 4, 6, 7 ,8, 9, 11, 13, 18, 20, 21, 22, 23, 24, 25, 26, 27, 29 WITH ALVEOLOPLASTY, BIOPSY OF PALATE LESION, REMOVA RIGHT LINGUAL TORUS;  Surgeon: Gae Bon, DDS;  Location: [place];  Service: Oral Surgery;  Laterality: N/A;  . TEE WITHOUT CARDIOVERSION N/A 07/11/2012   Procedure: TRANSESOPHAGEAL ECHOCARDIOGRAM (TEE);  Surgeon: Thayer Headings, MD;  Location: [place];  Service: Cardiovascular;  Laterality: N/A;  . TUBAL LIGATION     Social History   Socioeconomic History  . Marital status: Divorced    Spouse name: None  . Number of children: 2  . Years of education: None  . Highest education level: None  Social Needs  . Financial resource strain: None  . Food insecurity - worry: None  . Food insecurity - inability: None  . Transportation needs - medical: None  . Transportation needs - non-medical: None  Occupational History    Employer: [employer]    Comment: Works in Environmental consultant  Tobacco Use  . Smoking status: Current Every Day Smoker    Packs/day: 0.50    Years: 21.00    Pack years: 10.50    Types: Cigarettes    Start date: 07/02/1983  . Smokeless tobacco: Never Used  .  Tobacco comment: 5 cigs daily 11-26-12  Substance and Sexual Activity  . Alcohol use: No    Alcohol/week: 0.0 oz  . Drug use: No  . Sexual activity: No    Birth control/protection: Abstinence, Surgical    Comment: tubal  Other Topics Concern  . None  Social History Narrative  . None   Outpatient Encounter Medications as of 03/02/2017  Medication Sig  . albuterol (PROVENTIL HFA;VENTOLIN HFA) 108 (90 BASE) MCG/ACT inhaler Inhale 2 puffs into the lungs every 6 (six) hours as needed for wheezing or shortness of breath.  Marland Kitchen aspirin EC 81 MG tablet Take 81 mg by mouth 2 (two) times daily.   . budesonide-formoterol (SYMBICORT) 80-4.5 MCG/ACT inhaler Inhale 2 puffs into the lungs 2 (two) times daily.  . carvedilol (COREG) 12.5 MG  tablet Take 25 mg by mouth 2 (two) times daily with a meal.  . cetirizine (ZYRTEC) 10 MG tablet Take 10 mg by mouth daily.  . Cholecalciferol (VITAMIN D3) 5000 units CAPS Take 1 capsule (5,000 Units total) by mouth daily.  . clobetasol (TEMOVATE) 0.05 % external solution Apply 1 application topically 2 (two) times daily as needed (Apply to scalp.).   Marland Kitchen cyclobenzaprine (FLEXERIL) 5 MG tablet Take 1 tablet (5 mg total) by mouth 3 (three) times daily as needed.  . diclofenac sodium (VOLTAREN) 1 % GEL Apply 2 g topically 4 (four) times daily as needed (pain).   . Ferrous Sulfate (IRON) 28 MG TABS Take 1 tablet by mouth daily.  . fluticasone (FLONASE) 50 MCG/ACT nasal spray Place 2 sprays into both nostrils daily as needed for allergies.   . furosemide (LASIX) 40 MG tablet Take 1 tablet (40 mg total) by mouth 2 (two) times daily.  Marland Kitchen gabapentin (NEURONTIN) 800 MG tablet Take 800 mg by mouth 4 (four) times daily.  Marland Kitchen ketoconazole (NIZORAL) 2 % cream Apply 1 application topically 2 (two) times daily.   Marland Kitchen levothyroxine (SYNTHROID, LEVOTHROID) 200 MCG tablet Take 1 tablet (200 mcg total) by mouth daily before breakfast. Takes with 78mcg for a total of 289mcg (Patient taking differently: Take 200 mcg by mouth daily before breakfast. )  . levothyroxine (SYNTHROID, LEVOTHROID) 25 MCG tablet Take 1 tablet (25 mcg total) by mouth daily before breakfast.  . lisinopril (PRINIVIL,ZESTRIL) 10 MG tablet Take 1 tablet (10 mg total) by mouth daily.  . Multiple Vitamins-Minerals (CENTRAVITES 50 PLUS PO) Take by mouth daily.  . potassium chloride SA (K-DUR,KLOR-CON) 20 MEQ tablet Take 2 tablets (40 mEq total) by mouth 2 (two) times daily. (Patient taking differently: Take 20 mEq by mouth 2 (two) times daily. )  . ranitidine (ZANTAC) 150 MG tablet Take 150 mg by mouth at bedtime.  . rosuvastatin (CRESTOR) 20 MG tablet Take 20 mg by mouth daily.  . Simethicone (GAS-X PO) Take by mouth as needed.  . [DISCONTINUED] Vitamin D,  Ergocalciferol, (DRISDOL) 50000 units CAPS capsule TAKE 1 CAPSULE BY MOUTH EVERY 7 DAYS   No facility-administered encounter medications on file as of 03/02/2017.    ALLERGIES: Allergies  Allergen Reactions  . Fish Allergy Anaphylaxis  . Iodinated Diagnostic Agents Hives and Other (See Comments)    Pulmonary problems; no frank respiratory arrest  . Iodine Hives and Other (See Comments)    Pulmonary Problems   . Lentil Anaphylaxis  . Penicillins Anaphylaxis and Shortness Of Breath    Has patient had a PCN reaction causing immediate rash, facial/tongue/throat swelling, SOB or lightheadedness with hypotension: Yes Has patient had a  PCN reaction causing severe rash involving mucus membranes or skin necrosis: No Has patient had a PCN reaction that required hospitalization No Has patient had a PCN reaction occurring within the last 10 years: No If all of the above answers are "NO", then may proceed with Cephalosporin use.  Hair loss  . Lipitor [Atorvastatin] Itching and Rash  . Spironolactone Rash  . Sulfa Antibiotics Other (See Comments)    Unknown   VACCINATION STATUS: Immunization History  Administered Date(s) Administered  . Pneumococcal Polysaccharide-23 09/28/2010, 11/18/2010  . Pneumococcal-Unspecified 09/22/2016  . Tdap 09/28/2016    HPI  57 yr old female with medical hx as above. she is here to f/u for hypothyroidism and vitamin D deficiency.  She decided not to pursue bariatric surgery for her weight problem. - she has had 24 hour urine cortisol to r/o Cushings syndrome which was normal.  She is on Levothyroxine 200 mcg po qam. - She has gained 10 pounds since last visit. - she denies palpitations, heat intolerance. she denies chest pain, SOB. she has family hx of thyroid dysfunction in her mother. she has AICD . she smokes 1/2 PPD ( 30 PY).  Review of Systems  Constitutional:  Recently reversing the weight loss she achieved last year , no fatigue, no subjective  hyperthermia/hypothermia Eyes: no blurry vision, no xerophthalmia ENT: no sore throat, no nodules palpated in throat, no dysphagia/odynophagia, no hoarseness Cardiovascular: no CP/SOB/palpitations/leg swelling Respiratory: no cough/SOB Gastrointestinal: no N/V/D/C Musculoskeletal: no muscle/joint aches Skin: no rashes Neurological: no tremors/numbness/tingling/dizziness Psychiatric: no depression/anxiety  Objective:    BP 107/70   Pulse 82   Ht 5' 7.2" (1.707 m)   Wt 285 lb (129.3 kg)   BMI 44.37 kg/m   Wt Readings from Last 3 Encounters:  03/02/17 285 lb (129.3 kg)  10/26/16 275 lb (124.7 kg)  10/18/16 275 lb (124.7 kg)    Physical Exam  Constitutional: obese, in NAD Eyes: PERRLA, EOMI, no exophthalmos ENT: moist mucous membranes, no thyromegaly, no cervical lymphadenopathy Cardiovascular: RRR, No MRG Respiratory: CTA B Gastrointestinal: abdomen soft, NT, ND, BS+ Musculoskeletal: no deformities, strength intact in all 4 Skin: moist, warm, no rashes Neurological: no tremor with outstretched hands, DTR normal in all 4  Diabetic Labs (most recent): Lab Results  Component Value Date   HGBA1C 5.2 08/23/2016   HGBA1C 5.7 (H) 07/26/2015   HGBA1C 5.7 (H) 04/29/2013       Component Value Date/Time   CHOL 166 02/19/2016 1013   TRIG 217 (H) 02/19/2016 1013   HDL 38 (L) 02/19/2016 1013   CHOLHDL 4.4 02/19/2016 1013   VLDL 43 (H) 02/19/2016 1013   LDLCALC 85 02/19/2016 1013    Results for VALERIA, BOZA (MRN [id number]) as of 03/02/2017 13:47  Ref. Range 08/23/2016 10:14 02/23/2017 11:16  TSH Latest Units: mIU/L 0.14 (L) 15.61 (H)  T4,Free(Direct) Latest Ref Range: 0.8 - 1.8 ng/dL 1.6 1.0    Assessment & Plan:   1. Hypothyroidism, unspecified hypothyroidism type -she has long standing hypothyroidism . - Her recent labs showed  under replacement with thyroid hormone. I advised her to  increase her levothyroxine to 225 g by mouth every morning.    - We  discussed about correct intake of levothyroxine, at fasting, with water, separated by at least 30 minutes from breakfast, and separated by more than 4 hours from calcium, iron, multivitamins, acid reflux medications (PPIs). -Patient is made aware of the fact that thyroid hormone replacement is needed for life, dose to  be adjusted by periodic monitoring of thyroid function tests.  2. Morbid obesity due to excess calories ([place])   - she is given detailed carbs and exercise information. She does not have diabetes. Her 24 hour urine free cotisol is normal at 14.5, rules out Cushing's synrome.  She has seen  Jearld Fenton, CDE. Due to her legitimate concern on weight, I discussed bariatric surgery , however, she decided not to pursue that option. -  Suggestion is made for her to avoid simple carbohydrates  from her diet including Cakes, Sweet Desserts / Pastries, Ice Cream, Soda (diet and regular), Sweet Tea, Candies, Chips, Cookies, Store Bought Juices, Alcohol in Excess of  1-2 drinks a day, Artificial Sweeteners, and "Sugar-free" Products. This will help patient to have stable blood glucose profile and potentially avoid unintended weight gain.   3. Essential hypertension  -I advised her to continue her Lisinopril and carvedilol for HTN.  4. Hyperlipidemia - continue Crestor 20 mg by mouth daily at bedtime.   5. Vitamin D deficiency: She is on vitamin D supplement 50,000 units weekly  for 4 more weeks. After that I advised her to continue with vitamin D3 5000 units daily for the next 90 days.  6. Chronic heavy smoker:  She has COPD. She is  extensively counseled against smoking. He is not ready to quit.  - I advised patient to maintain close follow up with Rosita Fire, MD for primary care needs. Follow up plan: Return in about 6 months (around 08/31/2017) for follow up with pre-visit labs.  Glade Lloyd, MD Phone: [phone]  Fax: [phone]  -  This note was partially dictated with  voice recognition software. Similar sounding words can be transcribed inadequately or may not  be corrected upon review.  03/02/2017, 1:45 PM

## 2017-03-02 NOTE — Patient Instructions (Signed)

## 2017-03-06 ENCOUNTER — Ambulatory Visit (INDEPENDENT_AMBULATORY_CARE_PROVIDER_SITE_OTHER): Payer: Medicare Other | Admitting: *Deleted

## 2017-03-06 DIAGNOSIS — I639 Cerebral infarction, unspecified: Secondary | ICD-10-CM

## 2017-03-08 NOTE — Progress Notes (Signed)
Carelink Summary Report / Loop Recorder 

## 2017-03-09 ENCOUNTER — Encounter: Payer: Self-pay | Admitting: Nutrition

## 2017-03-17 LAB — CUP PACEART REMOTE DEVICE CHECK
Implantable Pulse Generator Implant Date: 20170601
MDC IDC LEAD IMPLANT DT: 20130729
MDC IDC LEAD LOCATION: 753860
MDC IDC LEAD SERIAL: 322336
MDC IDC SESS DTM: 20181223210929

## 2017-04-04 ENCOUNTER — Ambulatory Visit (INDEPENDENT_AMBULATORY_CARE_PROVIDER_SITE_OTHER): Payer: Medicare Other | Admitting: *Deleted

## 2017-04-04 DIAGNOSIS — I639 Cerebral infarction, unspecified: Secondary | ICD-10-CM

## 2017-04-05 NOTE — Progress Notes (Signed)
Carelink Summary Report / Loop Recorder 

## 2017-04-14 LAB — CUP PACEART REMOTE DEVICE CHECK
Implantable Lead Implant Date: 20130729
Implantable Lead Location: 753860
MDC IDC LEAD SERIAL: 322336
MDC IDC PG IMPLANT DT: 20170601
MDC IDC SESS DTM: 20190122214026

## 2017-05-08 ENCOUNTER — Ambulatory Visit: Payer: Medicare Other | Admitting: *Deleted

## 2017-05-08 ENCOUNTER — Telehealth: Payer: Self-pay | Admitting: Cardiology

## 2017-05-08 ENCOUNTER — Ambulatory Visit (INDEPENDENT_AMBULATORY_CARE_PROVIDER_SITE_OTHER): Payer: Medicare Other | Admitting: *Deleted

## 2017-05-08 DIAGNOSIS — I639 Cerebral infarction, unspecified: Secondary | ICD-10-CM

## 2017-05-08 DIAGNOSIS — I429 Cardiomyopathy, unspecified: Secondary | ICD-10-CM

## 2017-05-08 NOTE — Progress Notes (Signed)
Remote ICD transmission.   

## 2017-05-08 NOTE — Telephone Encounter (Signed)
Spoke with pt and reminded pt of remote transmission that is due today. Pt verbalized understanding.   

## 2017-05-08 NOTE — Progress Notes (Signed)
Carelink Summary Report / Loop Recorder 

## 2017-05-11 ENCOUNTER — Encounter: Payer: Self-pay | Admitting: Cardiology

## 2017-05-25 LAB — CUP PACEART REMOTE DEVICE CHECK
Battery Remaining Percentage: 52 %
Battery Voltage: 2.9 V
Brady Statistic RV Percent Paced: 1 %
Date Time Interrogation Session: 20190225191031
HIGH POWER IMPEDANCE MEASURED VALUE: 90 Ohm
HIGH POWER IMPEDANCE MEASURED VALUE: 90 Ohm
Implantable Lead Implant Date: 20130729
Implantable Lead Model: 181
Implantable Lead Serial Number: 322336
Lead Channel Impedance Value: 660 Ohm
Lead Channel Pacing Threshold Amplitude: 0.75 V
Lead Channel Pacing Threshold Pulse Width: 0.5 ms
Lead Channel Sensing Intrinsic Amplitude: 12 mV
MDC IDC LEAD LOCATION: 753860
MDC IDC MSMT BATTERY REMAINING LONGEVITY: 55 mo
MDC IDC PG IMPLANT DT: 20130729
MDC IDC PG SERIAL: 1022442
MDC IDC SET LEADCHNL RV PACING AMPLITUDE: 2.5 V
MDC IDC SET LEADCHNL RV PACING PULSEWIDTH: 0.5 ms
MDC IDC SET LEADCHNL RV SENSING SENSITIVITY: 0.5 mV

## 2017-06-06 ENCOUNTER — Other Ambulatory Visit: Payer: Self-pay | Admitting: "Endocrinology

## 2017-06-09 ENCOUNTER — Ambulatory Visit (INDEPENDENT_AMBULATORY_CARE_PROVIDER_SITE_OTHER): Payer: Medicare Other | Admitting: *Deleted

## 2017-06-09 DIAGNOSIS — I639 Cerebral infarction, unspecified: Secondary | ICD-10-CM | POA: Diagnosis not present

## 2017-06-12 LAB — CUP PACEART REMOTE DEVICE CHECK
Date Time Interrogation Session: 20190224224003
Implantable Lead Model: 181
Implantable Lead Serial Number: 322336
Implantable Pulse Generator Implant Date: 20170601
MDC IDC LEAD IMPLANT DT: 20130729
MDC IDC LEAD LOCATION: 753860

## 2017-06-12 NOTE — Progress Notes (Signed)
Carelink Summary Report / Loop Recorder 

## 2017-07-11 ENCOUNTER — Encounter: Payer: Self-pay | Admitting: Adult Health

## 2017-07-11 ENCOUNTER — Ambulatory Visit (INDEPENDENT_AMBULATORY_CARE_PROVIDER_SITE_OTHER): Payer: Medicare Other | Admitting: Adult Health

## 2017-07-11 VITALS — BP 128/70 | HR 76 | Ht 67.5 in | Wt 280.6 lb

## 2017-07-11 DIAGNOSIS — N898 Other specified noninflammatory disorders of vagina: Secondary | ICD-10-CM | POA: Diagnosis not present

## 2017-07-11 DIAGNOSIS — N760 Acute vaginitis: Secondary | ICD-10-CM

## 2017-07-11 DIAGNOSIS — B9689 Other specified bacterial agents as the cause of diseases classified elsewhere: Secondary | ICD-10-CM

## 2017-07-11 LAB — POCT WET PREP (WET MOUNT): Clue Cells Wet Prep Whiff POC: POSITIVE

## 2017-07-11 MED ORDER — METRONIDAZOLE 500 MG PO TABS: 500.0000 mg | ORAL_TABLET | Freq: Two times a day (BID) | ORAL | 0 refills | Status: DC

## 2017-07-11 NOTE — Patient Instructions (Signed)
Bacterial Vaginosis Bacterial vaginosis is a vaginal infection that occurs when the normal balance of bacteria in the vagina is disrupted. It results from an overgrowth of certain bacteria. This is the most common vaginal infection among women ages 15-44. Because bacterial vaginosis increases your risk for STIs (sexually transmitted infections), getting treated can help reduce your risk for chlamydia, gonorrhea, herpes, and HIV (human immunodeficiency virus). Treatment is also important for preventing complications in pregnant women, because this condition can cause an early (premature) delivery. What are the causes? This condition is caused by an increase in harmful bacteria that are normally present in small amounts in the vagina. However, the reason that the condition develops is not fully understood. What increases the risk? The following factors may make you more likely to develop this condition:  Having a new sexual partner or multiple sexual partners.  Having unprotected sex.  Douching.  Having an intrauterine device (IUD).  Smoking.  Drug and alcohol abuse.  Taking certain antibiotic medicines.  Being pregnant.  You cannot get bacterial vaginosis from toilet seats, bedding, swimming pools, or contact with objects around you. What are the signs or symptoms? Symptoms of this condition include:  Grey or white vaginal discharge. The discharge can also be watery or foamy.  A fish-like odor with discharge, especially after sexual intercourse or during menstruation.  Itching in and around the vagina.  Burning or pain with urination.  Some women with bacterial vaginosis have no signs or symptoms. How is this diagnosed? This condition is diagnosed based on:  Your medical history.  A physical exam of the vagina.  Testing a sample of vaginal fluid under a microscope to look for a large amount of bad bacteria or abnormal cells. Your health care provider may use a cotton swab  or a small wooden spatula to collect the sample.  How is this treated? This condition is treated with antibiotics. These may be given as a pill, a vaginal cream, or a medicine that is put into the vagina (suppository). If the condition comes back after treatment, a second round of antibiotics may be needed. Follow these instructions at home: Medicines  Take over-the-counter and prescription medicines only as told by your health care provider.  Take or use your antibiotic as told by your health care provider. Do not stop taking or using the antibiotic even if you start to feel better. General instructions  If you have a female sexual partner, tell her that you have a vaginal infection. She should see her health care provider and be treated if she has symptoms. If you have a female sexual partner, he does not need treatment.  During treatment: ? Avoid sexual activity until you finish treatment. ? Do not douche. ? Avoid alcohol as directed by your health care provider. ? Avoid breastfeeding as directed by your health care provider.  Drink enough water and fluids to keep your urine clear or pale yellow.  Keep the area around your vagina and rectum clean. ? Wash the area daily with warm water. ? Wipe yourself from front to back after using the toilet.  Keep all follow-up visits as told by your health care provider. This is important. How is this prevented?  Do not douche.  Wash the outside of your vagina with warm water only.  Use protection when having sex. This includes latex condoms and dental dams.  Limit how many sexual partners you have. To help prevent bacterial vaginosis, it is best to have sex with just   one partner (monogamous).  Make sure you and your sexual partner are tested for STIs.  Wear cotton or cotton-lined underwear.  Avoid wearing tight pants and pantyhose, especially during summer.  Limit the amount of alcohol that you drink.  Do not use any products that  contain nicotine or tobacco, such as cigarettes and e-cigarettes. If you need help quitting, ask your health care provider.  Do not use illegal drugs. Where to find more information:  Centers for Disease Control and Prevention: www.cdc.gov/std  American Sexual Health Association (ASHA): www.ashastd.org  U.S. Department of Health and Human Services, Office on Women's Health: www.womenshealth.gov/ or https://www.womenshealth.gov/a-z-topics/bacterial-vaginosis Contact a health care provider if:  Your symptoms do not improve, even after treatment.  You have more discharge or pain when urinating.  You have a fever.  You have pain in your abdomen.  You have pain during sex.  You have vaginal bleeding between periods. Summary  Bacterial vaginosis is a vaginal infection that occurs when the normal balance of bacteria in the vagina is disrupted.  Because bacterial vaginosis increases your risk for STIs (sexually transmitted infections), getting treated can help reduce your risk for chlamydia, gonorrhea, herpes, and HIV (human immunodeficiency virus). Treatment is also important for preventing complications in pregnant women, because the condition can cause an early (premature) delivery.  This condition is treated with antibiotic medicines. These may be given as a pill, a vaginal cream, or a medicine that is put into the vagina (suppository). This information is not intended to replace advice given to you by your health care provider. Make sure you discuss any questions you have with your health care provider. Document Released: 02/28/2005 Document Revised: 07/04/2016 Document Reviewed: 11/14/2015 Elsevier Interactive Patient Education  2018 Elsevier Inc.  

## 2017-07-11 NOTE — Progress Notes (Signed)
  Subjective:     Patient ID: Melanie Cordova, female   DOB: 09-05-1960, 57 y.o.   MRN: [id number]  HPI Melanie Cordova is a 57 year old white female, PM in complaining of vaginal discharge with odor. PCP is [name].   Review of Systems +vaginal discharge with odor Denies any itching or burning +throbs at times No sex in 7 years, but has toys,she cleans them  Reviewed past medical,surgical, social and family history. Reviewed medications and allergies.     Objective:   Physical Exam BP 128/70 (BP Location: Left Arm, Patient Position: Sitting, Cuff Size: Large)   Pulse 76   Ht 5' 7.5" (1.715 m)   Wt 280 lb 9.6 oz (127.3 kg)   BMI 43.30 kg/m  Skin warm and dry.Pelvic: external genitalia is normal in appearance no lesions, vagina: white discharge with odor,urethra has no lesions or masses noted, cervix:smooth and bulbous, uterus: normal size, shape and contour, non tender, no masses felt, adnexa: no masses or tenderness noted. Bladder is non tender and no masses felt. Wet prep: + for clue cells and +WBCs.   PHQ 9 score 8 denies being suicidal.  Assessment:     1. BV (bacterial vaginosis)   2. Vaginal discharge       Plan:    Rx flagyl 500 mg 1 bid x 7 days, no alcohol, review handout on BV    F/U prn

## 2017-07-12 ENCOUNTER — Ambulatory Visit (INDEPENDENT_AMBULATORY_CARE_PROVIDER_SITE_OTHER): Payer: Medicare Other | Admitting: *Deleted

## 2017-07-12 DIAGNOSIS — I639 Cerebral infarction, unspecified: Secondary | ICD-10-CM

## 2017-07-13 NOTE — Progress Notes (Signed)
Carelink Summary Report / Loop Recorder 

## 2017-07-14 LAB — CUP PACEART REMOTE DEVICE CHECK
Date Time Interrogation Session: 20190329223804
Implantable Lead Implant Date: 20130729
Implantable Lead Location: 753860
Implantable Lead Model: 181
Implantable Lead Serial Number: 322336
MDC IDC PG IMPLANT DT: 20170601

## 2017-08-07 LAB — CUP PACEART REMOTE DEVICE CHECK
Implantable Lead Implant Date: 20130729
Implantable Lead Model: 181
Implantable Lead Serial Number: 322336
MDC IDC LEAD LOCATION: 753860
MDC IDC PG IMPLANT DT: 20170601
MDC IDC SESS DTM: 20190501223949

## 2017-08-08 ENCOUNTER — Telehealth: Payer: Self-pay

## 2017-08-08 ENCOUNTER — Ambulatory Visit (INDEPENDENT_AMBULATORY_CARE_PROVIDER_SITE_OTHER): Payer: Medicare Other | Admitting: *Deleted

## 2017-08-08 DIAGNOSIS — I429 Cardiomyopathy, unspecified: Secondary | ICD-10-CM

## 2017-08-08 NOTE — Telephone Encounter (Signed)
Spoke with pt and reminded pt of remote transmission that is due today. Pt verbalized understanding.   

## 2017-08-09 NOTE — Progress Notes (Signed)
Remote ICD transmission.   

## 2017-08-11 ENCOUNTER — Encounter: Payer: Self-pay | Admitting: Cardiology

## 2017-08-14 ENCOUNTER — Ambulatory Visit (INDEPENDENT_AMBULATORY_CARE_PROVIDER_SITE_OTHER): Payer: Medicare Other | Admitting: *Deleted

## 2017-08-14 DIAGNOSIS — I639 Cerebral infarction, unspecified: Secondary | ICD-10-CM | POA: Diagnosis not present

## 2017-08-15 NOTE — Progress Notes (Signed)
Carelink Summary Report / Loop Recorder 

## 2017-08-16 ENCOUNTER — Encounter: Payer: Self-pay | Admitting: Internal Medicine

## 2017-08-16 ENCOUNTER — Ambulatory Visit (INDEPENDENT_AMBULATORY_CARE_PROVIDER_SITE_OTHER): Payer: Medicare Other | Admitting: Internal Medicine

## 2017-08-16 VITALS — BP 130/62 | HR 76 | Wt 282.6 lb

## 2017-08-16 DIAGNOSIS — I5022 Chronic systolic (congestive) heart failure: Secondary | ICD-10-CM | POA: Diagnosis not present

## 2017-08-16 NOTE — Patient Instructions (Signed)

## 2017-08-16 NOTE — Progress Notes (Signed)
HPI Mrs. Kotlarz returns today for followup. She is a very pleasant 57 year old woman with an ischemic cardiomyopathy, chronic systolic heart failure, severe musculoskeletal pain, hypertension, and COPD. She denies shortness of breath. No ICD shock. No peripheral edema. She continues to smoke cigarette, 1/2 PPD. She is trying to lose weight. She had a TIA several months ago and we placed an ILR. No palpitations. She has non-cardicac chest pain.  Allergies  Allergen Reactions  . Fish Allergy Anaphylaxis  . Iodinated Diagnostic Agents Hives and Other (See Comments)    Pulmonary problems; no frank respiratory arrest  . Iodine Hives and Other (See Comments)    Pulmonary Problems   . Lentil Anaphylaxis  . Penicillins Anaphylaxis and Shortness Of Breath    Has patient had a PCN reaction causing immediate rash, facial/tongue/throat swelling, SOB or lightheadedness with hypotension: Yes Has patient had a PCN reaction causing severe rash involving mucus membranes or skin necrosis: No Has patient had a PCN reaction that required hospitalization No Has patient had a PCN reaction occurring within the last 10 years: No If all of the above answers are "NO", then may proceed with Cephalosporin use.  Hair loss  . Lipitor [Atorvastatin] Itching and Rash  . Spironolactone Rash  . Sulfa Antibiotics Other (See Comments)    Unknown     Current Outpatient Medications  Medication Sig Dispense Refill  . albuterol (PROVENTIL HFA;VENTOLIN HFA) 108 (90 BASE) MCG/ACT inhaler Inhale 2 puffs into the lungs every 6 (six) hours as needed for wheezing or shortness of breath. 1 Inhaler 5  . aspirin EC 81 MG tablet Take 81 mg by mouth 2 (two) times daily.     . budesonide-formoterol (SYMBICORT) 80-4.5 MCG/ACT inhaler Inhale 2 puffs into the lungs 2 (two) times daily.    . carvedilol (COREG) 12.5 MG tablet Take 25 mg by mouth 2 (two) times daily with a meal.    . cetirizine (ZYRTEC) 10 MG tablet Take 10 mg by  mouth daily.    . Cholecalciferol (VITAMIN D3) 5000 units CAPS TAKE ONE CAPSULE BY MOUTH DAILY 90 capsule 0  . clobetasol (TEMOVATE) 0.05 % external solution Apply 1 application topically 2 (two) times daily as needed (Apply to scalp.).     Marland Kitchen diclofenac sodium (VOLTAREN) 1 % GEL Apply 2 g topically 4 (four) times daily as needed (pain).     . Ferrous Sulfate (IRON) 28 MG TABS Take 1 tablet by mouth daily.    . fluticasone (FLONASE) 50 MCG/ACT nasal spray Place 2 sprays into both nostrils daily as needed for allergies.     . furosemide (LASIX) 40 MG tablet Take 1 tablet (40 mg total) by mouth 2 (two) times daily. 180 tablet 0  . gabapentin (NEURONTIN) 800 MG tablet Take 800 mg by mouth 4 (four) times daily.    Marland Kitchen ketoconazole (NIZORAL) 2 % cream Apply 1 application topically 2 (two) times daily.     Marland Kitchen levothyroxine (SYNTHROID, LEVOTHROID) 200 MCG tablet Take 1 tablet (200 mcg total) by mouth daily before breakfast. Takes with 13mcg for a total of 246mcg (Patient taking differently: Take 200 mcg by mouth daily before breakfast. ) 90 tablet 2  . levothyroxine (SYNTHROID, LEVOTHROID) 25 MCG tablet Take 1 tablet (25 mcg total) by mouth daily before breakfast. 30 tablet 6  . lisinopril (PRINIVIL,ZESTRIL) 10 MG tablet Take 1 tablet (10 mg total) by mouth daily. 90 tablet 0  . Multiple Vitamins-Minerals (CENTRAVITES 50 PLUS PO) Take by mouth  daily.    . potassium chloride SA (K-DUR,KLOR-CON) 20 MEQ tablet Take 2 tablets (40 mEq total) by mouth 2 (two) times daily. (Patient taking differently: Take 20 mEq by mouth 2 (two) times daily. ) 180 tablet 0  . ranitidine (ZANTAC) 150 MG tablet Take 150 mg by mouth at bedtime.    . rosuvastatin (CRESTOR) 20 MG tablet Take 20 mg by mouth daily.    . Simethicone (GAS-X PO) Take by mouth as needed.     No current facility-administered medications for this visit.      Past Medical History:  Diagnosis Date  . Anxiety   . Arthritis   . Asthma   . Automatic  implantable cardioverter-defibrillator in situ    2013, july  . Bell palsy    states has had 3 episodes  . Cardiomyopathy 08/2010   Presented with congestive heart failure; EF of 15% and 2012; hypotension on medication precludes optimal dosing  . CHF (congestive heart failure) ([place])   . Chronic systolic heart failure ([place])   . COPD (chronic obstructive pulmonary disease) ([place])    2013  . Dysrhythmia   . Gastroesophageal reflux disease   . Headache(784.0)   . Hot flashes 10/04/2016  . Hyperlipidemia   . Hypertension    09/2010-normal CMet and CBC; Lipid profile-116, 88, 25, 73  . Hypothyroidism    Recent TSH was normal.  . 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 Neuromuscular disorder ([facility])    Neuropathy, right foot and leg  . Neuropathy   . Obesity   . Pneumonia   . Seizures ([place])    last one at age 63  . Shortness of breath   . Stroke [facility])     stroke in 07/2012 and another in June, 2014  . Tobacco abuse    20 pack years    ROS:   All systems reviewed and negative except as noted in the HPI.   Past Surgical History:  Procedure Laterality Date  . CESAREAN SECTION     X2  . COLONOSCOPY N/A 12/16/2013   Procedure: COLONOSCOPY;  Surgeon: Danie Binder, MD;  Location[facility];  Service: Endoscopy;  Laterality: N/A;  10:45-moved to 10/5 @ [place] notified pt  . EP IMPLANTABLE DEVICE     St. Jude  . EP IMPLANTABLE DEVICE N/A 08/13/2015   Procedure: Loop Recorder Insertion;  Surgeon: Evans Lance, MD;  Location: [place] CV LAB;  Service: Cardiovascular;  Laterality: N/A;  . IMPLANTABLE CARDIOVERTER DEFIBRILLATOR IMPLANT N/A 10/10/2011   Procedure: IMPLANTABLE CARDIOVERTER DEFIBRILLATOR IMPLANT;  Surgeon: Evans Lance, MD;  Location: [facility] CATH LAB;  Service: Cardiovascular;  Laterality: N/A;  . MULTIPLE EXTRACTIONS WITH ALVEOLOPLASTY N/A 09/24/2012   Procedure: MULTIPLE EXTRACION #2, 4, 6, 7 ,8, 9, 11, 13, 18, 20, 21, 22, 23, 24, 25, 26, 27,  29 WITH ALVEOLOPLASTY, BIOPSY OF PALATE LESION, REMOVA RIGHT LINGUAL TORUS;  Surgeon: Gae Bon, DDS;  Location: [place];  Service: Oral Surgery;  Laterality: N/A;  . TEE WITHOUT CARDIOVERSION N/A 07/11/2012   Procedure: TRANSESOPHAGEAL ECHOCARDIOGRAM (TEE);  Surgeon: Thayer Headings, MD;  Location: [facility] ENDOSCOPY;  Service: Cardiovascular;  Laterality: N/A;  . TUBAL LIGATION       Family History  Problem Relation Age of Onset  . Cardiomyopathy Mother        ICD pacemaker-ischmic CM  . Heart failure Mother   . Hypertension Mother   . Diabetes Mother   . Thyroid  disease Mother   . Cardiomyopathy Father        Deceased  . Coronary artery disease Father   . Heart failure Father   . Heart failure Brother   . Hypertension Brother   . Endometriosis Sister   . Heart disease Paternal Grandfather   . Heart disease Paternal Grandmother   . Heart disease Maternal Grandmother   . Heart disease Maternal Grandfather   . Hypertension Daughter   . Obesity Daughter   . Colon cancer Neg Hx   . Liver disease Neg Hx      Social History   Socioeconomic History  . Marital status: Divorced    Spouse name: Not on file  . Number of children: 2  . Years of education: Not on file  . Highest education level: Not on file  Occupational History    Employer: [employer]    Comment: Works in Environmental consultant  Social Needs  . Financial resource strain: Not on file  . Food insecurity:    Worry: Not on file    Inability: Not on file  . Transportation needs:    Medical: Not on file    Non-medical: Not on file  Tobacco Use  . Smoking status: Current Every Day Smoker    Packs/day: 0.50    Years: 21.00    Pack years: 10.50    Types: Cigarettes    Start date: 07/02/1983  . Smokeless tobacco: Never Used  . Tobacco comment: 5 cigs daily 11-26-12  Substance and Sexual Activity  . Alcohol use: No    Alcohol/week: 0.0 oz  . Drug use: No  . Sexual activity: Not Currently    Birth  control/protection: Abstinence, Surgical    Comment: tubal  Lifestyle  . Physical activity:    Days per week: Not on file    Minutes per session: Not on file  . Stress: Not on file  Relationships  . Social connections:    Talks on phone: Not on file    Gets together: Not on file    Attends religious service: Not on file    Active member of club or organization: Not on file    Attends meetings of clubs or organizations: Not on file    Relationship status: Not on file  . Intimate partner violence:    Fear of current or ex partner: Not on file    Emotionally abused: Not on file    Physically abused: Not on file    Forced sexual activity: Not on file  Other Topics Concern  . Not on file  Social History Narrative  . Not on file     BP 130/62   Pulse 76   Wt 282 lb 9.6 oz (128.2 kg)   SpO2 96%   BMI 43.61 kg/m   Physical Exam:  Well appearing NAD HEENT: Unremarkable Neck:  No JVD, no thyromegally Lymphatics:  No adenopathy Back:  No CVA tenderness Lungs:  Clear with no wheezes HEART:  Regular rate rhythm, no murmurs, no rubs, no clicks Abd:  soft, positive bowel sounds, no organomegally, no rebound, no guarding Ext:  2 plus pulses, no edema, no cyanosis, no clubbing Skin:  No rashes no nodules Neuro:  CN II through XII intact, motor grossly intact   DEVICE  Normal device function.  See PaceArt for details.   Assess/Plan: 1. Chronic systolic heart failure - her symptoms are class 2. She will continue her current meds. Her symptoms are well compensated. 2.  TIA - she has had no atrial fib.  3. HTN - her blood pressure is well controlled. She is encouraged to lose weight. She admits to dietary indiscretion. 4. Obesity - She is encouraged to lose weight. Will follow.  Melanie Cordova.D.

## 2017-08-26 LAB — HEMOGLOBIN A1C
EAG (MMOL/L): 5.5 (calc)
HEMOGLOBIN A1C: 5.1 %{Hb} (ref <5.7)
MEAN PLASMA GLUCOSE: 100 (calc)

## 2017-08-26 LAB — T4, FREE: Free T4: 1.7 ng/dL (ref 0.8–1.8)

## 2017-08-26 LAB — TSH: TSH: 0.19 mIU/L — ABNORMAL LOW

## 2017-08-29 LAB — CUP PACEART REMOTE DEVICE CHECK
Battery Remaining Longevity: 53 mo
Battery Remaining Percentage: 50 %
Brady Statistic RV Percent Paced: 1 %
HIGH POWER IMPEDANCE MEASURED VALUE: 84 Ohm
HighPow Impedance: 84 Ohm
Implantable Lead Model: 181
Implantable Lead Serial Number: 322336
Implantable Pulse Generator Implant Date: 20130729
Lead Channel Impedance Value: 640 Ohm
Lead Channel Pacing Threshold Amplitude: 0.75 V
Lead Channel Pacing Threshold Pulse Width: 0.5 ms
Lead Channel Setting Pacing Amplitude: 2.5 V
Lead Channel Setting Pacing Pulse Width: 0.5 ms
Lead Channel Setting Sensing Sensitivity: 0.5 mV
MDC IDC LEAD IMPLANT DT: 20130729
MDC IDC LEAD LOCATION: 753860
MDC IDC MSMT BATTERY VOLTAGE: 2.9 V
MDC IDC MSMT LEADCHNL RV SENSING INTR AMPL: 12 mV
MDC IDC PG SERIAL: 1022442
MDC IDC SESS DTM: 20190528183239

## 2017-08-31 ENCOUNTER — Encounter: Payer: Medicare Other | Attending: Internal Medicine | Admitting: Nutrition

## 2017-08-31 ENCOUNTER — Ambulatory Visit (INDEPENDENT_AMBULATORY_CARE_PROVIDER_SITE_OTHER): Payer: Medicare Other | Admitting: "Endocrinology

## 2017-08-31 ENCOUNTER — Encounter: Payer: Self-pay | Admitting: "Endocrinology

## 2017-08-31 VITALS — BP 142/80 | HR 81 | Ht 67.5 in | Wt 278.0 lb

## 2017-08-31 VITALS — Ht 67.5 in | Wt 278.0 lb

## 2017-08-31 DIAGNOSIS — E038 Other specified hypothyroidism: Secondary | ICD-10-CM

## 2017-08-31 DIAGNOSIS — E559 Vitamin D deficiency, unspecified: Secondary | ICD-10-CM

## 2017-08-31 DIAGNOSIS — Z6841 Body Mass Index (BMI) 40.0 and over, adult: Secondary | ICD-10-CM | POA: Insufficient documentation

## 2017-08-31 DIAGNOSIS — Z713 Dietary counseling and surveillance: Secondary | ICD-10-CM | POA: Insufficient documentation

## 2017-08-31 DIAGNOSIS — E669 Obesity, unspecified: Secondary | ICD-10-CM

## 2017-08-31 MED ORDER — LEVOTHYROXINE SODIUM 200 MCG PO TABS
200.0000 ug | ORAL_TABLET | Freq: Every day | ORAL | 2 refills | Status: DC
Start: 2017-08-31 — End: 2018-03-01

## 2017-08-31 NOTE — Progress Notes (Signed)
  Medical Nutrition Therapy:  Appt start time: 1000   time:  Assessment:  Primary concerns today: Obesity and prediabetes follow up. Lost 7 lbs. A1C 5.1%. Has been using a airfryer. She loves it. Cut has cut out the bacon and sausage and cut out red meat. Eating more fresh fruits and vegetables.  Saw Dr. [name] today.        Lab Results  Component Value Date   HGBA1C 5.1 08/25/2017   Wt Readings from Last 3 Encounters:  08/31/17 278 lb (126.1 kg)  08/31/17 278 lb (126.1 kg)  08/16/17 282 lb 9.6 oz (128.2 kg)   Ht Readings from Last 3 Encounters:  08/31/17 5' 7.5" (1.715 m)  08/31/17 5' 7.5" (1.715 m)  07/11/17 5' 7.5" (1.715 m)   Lipid Panel     Component Value Date/Time   CHOL 166 02/19/2016 1013   TRIG 217 (H) 02/19/2016 1013   HDL 38 (L) 02/19/2016 1013   CHOLHDL 4.4 02/19/2016 1013   VLDL 43 (H) 02/19/2016 1013   LDLCALC 85 02/19/2016 1013   Preferred Learning Style:     No preference indicated   Learning Readiness:    Ready  Change in progress   MEDICATIONS: see list   DIETARY INTAKE:  24-hr recall:  B ( AM):boiled egg, fruit, water L ( PM):  Chicken grilled salad, water D ( PM): other half of her salad from lunch  Usual physical activity: walking 30 minutes a few times per week.   Estimated energy needs: 1600 calories 180 g carbohydrates 120 g protein 44 g fat  Progress Towards Goal(s):  In progress.   Nutritional Diagnosis:  NB-1.1 Food and nutrition-related knowledge deficit As related to Obesity.  As evidenced by BMI > 40.    Intervention:  Nutrition counseling on healthy weight loss tips, ways to prevent diabetes and importance of exercise. Low sodium, low fat, high fiber diet and meal planning and exercises for needed weight loss. High Fiber low fat diet   Goal. Keep up the great job! Increase walking to 30-45 minutes a day Lose 2-3 lbs per month.    Teaching Method Utilized:  Visual Auditory Hands on  Handouts given during  visit include: Healthy Weight loss tips Chair exercises  Barriers to learning/adherence to lifestyle change: none  Demonstrated degree of understanding via:  Teach Back   Monitoring/Evaluation:  Dietary intake, exercise, meal planning, and body weight in 3  month(s).

## 2017-08-31 NOTE — Patient Instructions (Signed)
Goal. Keep up the great job! Increase walking to 30-45 minutes a day Lose 2-3 lbs per month.

## 2017-08-31 NOTE — Progress Notes (Signed)
Subjective:    Patient ID: Melanie Cordova, female    DOB: [birth year], PCP Rosita Fire, MD   Past Medical History:  Diagnosis Date  . Anxiety   . Arthritis   . Asthma   . Automatic implantable cardioverter-defibrillator in situ    2013, july  . Bell palsy    states has had 3 episodes  . Cardiomyopathy 08/2010   Presented with congestive heart failure; EF of 15% and 2012; hypotension on medication precludes optimal dosing  . CHF (congestive heart failure) ([place])   . Chronic systolic heart failure ([place])   . COPD (chronic obstructive pulmonary disease) ([place])    2013  . Dysrhythmia   . Gastroesophageal reflux disease   . Headache(784.0)   . Hot flashes 10/04/2016  . Hyperlipidemia   . Hypertension    09/2010-normal CMet and CBC; Lipid profile-116, 88, 25, 73  . Hypothyroidism    Recent TSH was normal.  . 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 Neuromuscular disorder (HCC)    Neuropathy, right foot and leg  . Neuropathy   . Obesity   . Pneumonia   . Seizures ([place])    last one at age 35  . Shortness of breath   . Stroke [facility])     stroke in 07/2012 and another in June, 2014  . Tobacco abuse    20 pack years   Past Surgical History:  Procedure Laterality Date  . CESAREAN SECTION     X2  . COLONOSCOPY N/A 12/16/2013   Procedure: COLONOSCOPY;  Surgeon: Danie Binder, MD;  Location[facility];  Service: Endoscopy;  Laterality: N/A;  10:45-moved to 10/5 @ [place] notified pt  . EP IMPLANTABLE DEVICE     St. Jude  . EP IMPLANTABLE DEVICE N/A 08/13/2015   Procedure: Loop Recorder Insertion;  Surgeon: Evans Lance, MD;  Location: [place] [facility] LAB;  Service: Cardiovascular;  Laterality: N/A;  . IMPLANTABLE CARDIOVERTER DEFIBRILLATOR IMPLANT N/A 10/10/2011   Procedure: IMPLANTABLE CARDIOVERTER DEFIBRILLATOR IMPLANT;  Surgeon: Evans Lance, MD;  Location: [facility] CATH LAB;  Service: Cardiovascular;  Laterality: N/A;  . MULTIPLE EXTRACTIONS WITH  ALVEOLOPLASTY N/A 09/24/2012   Procedure: MULTIPLE EXTRACION #2, 4, 6, 7 ,8, 9, 11, 13, 18, 20, 21, 22, 23, 24, 25, 26, 27, 29 WITH ALVEOLOPLASTY, BIOPSY OF PALATE LESION, REMOVA RIGHT LINGUAL TORUS;  Surgeon: Gae Bon, DDS;  Location: [place];  Service: Oral Surgery;  Laterality: N/A;  . TEE WITHOUT CARDIOVERSION N/A 07/11/2012   Procedure: TRANSESOPHAGEAL ECHOCARDIOGRAM (TEE);  Surgeon: Thayer Headings, MD;  Location: [place];  Service: Cardiovascular;  Laterality: N/A;  . TUBAL LIGATION     Social History   Socioeconomic History  . Marital status: Divorced    Spouse name: Not on file  . Number of children: 2  . Years of education: Not on file  . Highest education level: Not on file  Occupational History    Employer: [employer]    Comment: Works in Environmental consultant  Social Needs  . Financial resource strain: Not on file  . Food insecurity:    Worry: Not on file    Inability: Not on file  . Transportation needs:    Medical: Not on file    Non-medical: Not on file  Tobacco Use  . Smoking status: Current Every Day Smoker    Packs/day: 0.50    Years: 21.00    Pack years: 10.50  Types: Cigarettes    Start date: 07/02/1983  . Smokeless tobacco: Never Used  . Tobacco comment: 5 cigs daily 11-26-12  Substance and Sexual Activity  . Alcohol use: No    Alcohol/week: 0.0 oz  . Drug use: No  . Sexual activity: Not Currently    Birth control/protection: Abstinence, Surgical    Comment: tubal  Lifestyle  . Physical activity:    Days per week: Not on file    Minutes per session: Not on file  . Stress: Not on file  Relationships  . Social connections:    Talks on phone: Not on file    Gets together: Not on file    Attends religious service: Not on file    Active member of club or organization: Not on file    Attends meetings of clubs or organizations: Not on file    Relationship status: Not on file  Other Topics Concern  . Not on file  Social History Narrative   . Not on file   Outpatient Encounter Medications as of 08/31/2017  Medication Sig  . albuterol (PROVENTIL HFA;VENTOLIN HFA) 108 (90 BASE) MCG/ACT inhaler Inhale 2 puffs into the lungs every 6 (six) hours as needed for wheezing or shortness of breath.  Marland Kitchen aspirin EC 81 MG tablet Take 81 mg by mouth 2 (two) times daily.   . budesonide-formoterol (SYMBICORT) 80-4.5 MCG/ACT inhaler Inhale 2 puffs into the lungs 2 (two) times daily.  . carvedilol (COREG) 12.5 MG tablet Take 25 mg by mouth 2 (two) times daily with a meal.  . cetirizine (ZYRTEC) 10 MG tablet Take 10 mg by mouth daily.  . Cholecalciferol (VITAMIN D3) 5000 units CAPS TAKE ONE CAPSULE BY MOUTH DAILY  . clobetasol (TEMOVATE) 0.05 % external solution Apply 1 application topically 2 (two) times daily as needed (Apply to scalp.).   Marland Kitchen diclofenac sodium (VOLTAREN) 1 % GEL Apply 2 g topically 4 (four) times daily as needed (pain).   . Ferrous Sulfate (IRON) 28 MG TABS Take 1 tablet by mouth daily.  . fluticasone (FLONASE) 50 MCG/ACT nasal spray Place 2 sprays into both nostrils daily as needed for allergies.   . furosemide (LASIX) 40 MG tablet Take 1 tablet (40 mg total) by mouth 2 (two) times daily.  Marland Kitchen gabapentin (NEURONTIN) 800 MG tablet Take 800 mg by mouth 4 (four) times daily.  Marland Kitchen ketoconazole (NIZORAL) 2 % cream Apply 1 application topically 2 (two) times daily.   Marland Kitchen levothyroxine (SYNTHROID, LEVOTHROID) 200 MCG tablet Take 1 tablet (200 mcg total) by mouth daily before breakfast. Takes with 25mcg for a total of 220mcg  . lisinopril (PRINIVIL,ZESTRIL) 10 MG tablet Take 1 tablet (10 mg total) by mouth daily.  . Multiple Vitamins-Minerals (CENTRAVITES 50 PLUS PO) Take by mouth daily.  . potassium chloride SA (K-DUR,KLOR-CON) 20 MEQ tablet Take 2 tablets (40 mEq total) by mouth 2 (two) times daily. (Patient taking differently: Take 20 mEq by mouth 2 (two) times daily. )  . ranitidine (ZANTAC) 150 MG tablet Take 150 mg by mouth at bedtime.  .  rosuvastatin (CRESTOR) 20 MG tablet Take 20 mg by mouth daily.  . Simethicone (GAS-X PO) Take by mouth as needed.  . [DISCONTINUED] levothyroxine (SYNTHROID, LEVOTHROID) 200 MCG tablet Take 1 tablet (200 mcg total) by mouth daily before breakfast. Takes with 87mcg for a total of 28mcg (Patient taking differently: Take 200 mcg by mouth daily before breakfast. )  . [DISCONTINUED] levothyroxine (SYNTHROID, LEVOTHROID) 25 MCG tablet Take 1 tablet (  25 mcg total) by mouth daily before breakfast.   No facility-administered encounter medications on file as of 08/31/2017.    ALLERGIES: Allergies  Allergen Reactions  . Fish Allergy Anaphylaxis  . Iodinated Diagnostic Agents Hives and Other (See Comments)    Pulmonary problems; no frank respiratory arrest  . Iodine Hives and Other (See Comments)    Pulmonary Problems   . Lentil Anaphylaxis  . Penicillins Anaphylaxis and Shortness Of Breath    Has patient had a PCN reaction causing immediate rash, facial/tongue/throat swelling, SOB or lightheadedness with hypotension: Yes Has patient had a PCN reaction causing severe rash involving mucus membranes or skin necrosis: No Has patient had a PCN reaction that required hospitalization No Has patient had a PCN reaction occurring within the last 10 years: No If all of the above answers are "NO", then may proceed with Cephalosporin use.  Hair loss  . Lipitor [Atorvastatin] Itching and Rash  . Spironolactone Rash  . Sulfa Antibiotics Other (See Comments)    Unknown   VACCINATION STATUS: Immunization History  Administered Date(s) Administered  . Pneumococcal Polysaccharide-23 09/28/2010, 11/18/2010  . Pneumococcal-Unspecified 09/22/2016  . Tdap 09/28/2016    HPI  57 yr old female with medical history as above. she is here with repeat labs for follow-up of hypothyroidism.  -She has lost 8 pounds since last visit. She decided not to pursue bariatric surgery for her weight problem. - she has had 24  hour urine cortisol to r/o Cushings syndrome which was normal.  She is on Levothyroxine 225 mcg po qam. - she denies palpitations, heat intolerance. she denies chest pain, SOB. she has family history of thyroid dysfunction in her mother. she has AICD . she smokes 1/2 PPD ( 30 PY).  Review of Systems  Constitutional:  +  weight loss  , no fatigue, no subjective hyperthermia/hypothermia Eyes: no blurry vision, no xerophthalmia ENT: no sore throat, no nodules palpated in throat, no nausea, no odynophagia.  Cardiovascular: no pain, no palpitations.   Respiratory: no cough/SOB Gastrointestinal: no N/V/D/C Musculoskeletal: no muscle/joint aches Skin: no rashes Neurological: no tremors, no numbness.  Psychiatric: no depression/anxiety  Objective:    BP (!) 142/80   Pulse 81   Ht 5' 7.5" (1.715 m)   Wt 278 lb (126.1 kg)   BMI 42.90 kg/m   Wt Readings from Last 3 Encounters:  08/31/17 278 lb (126.1 kg)  08/31/17 278 lb (126.1 kg)  08/16/17 282 lb 9.6 oz (128.2 kg)    Physical Exam  Constitutional: obese, not in acute distress.   Eyes: PERRLA, EOMI, no exophthalmos ENT: moist mucous membranes, no thyromegaly, no cervical lymphadenopathy Musculoskeletal: no deformities, strength intact in all 4 Skin: moist, warm, no rashes Neurological: no tremor with outstretched hands.  Diabetic Labs (most recent): Lab Results  Component Value Date   HGBA1C 5.1 08/25/2017   HGBA1C 5.2 08/23/2016   HGBA1C 5.7 (H) 07/26/2015       Component Value Date/Time   CHOL 166 02/19/2016 1013   TRIG 217 (H) 02/19/2016 1013   HDL 38 (L) 02/19/2016 1013   CHOLHDL 4.4 02/19/2016 1013   VLDL 43 (H) 02/19/2016 1013   LDLCALC 85 02/19/2016 1013   August 25, 2017 labs show TSH suppressed at 0.19, free T4 high normal at 1.7   Assessment & Plan:   1. Hypothyroidism  -she has long standing hypothyroidism . - Her recent labs showed over replacement with thyroid hormone.  This could be due to her  significant  weight loss since last visit.   -I discussed and lowered her levothyroxine to 200 mcg p.o. before breakfast.   - We discussed about correct intake of levothyroxine, at fasting, with water, separated by at least 30 minutes from breakfast, and separated by more than 4 hours from calcium, iron, multivitamins, acid reflux medications (PPIs). -Patient is made aware of the fact that thyroid hormone replacement is needed for life, dose to be adjusted by periodic monitoring of thyroid function tests.  2. Morbid obesity due to excess calories ([place])   - she is given detailed carbs and exercise information. She does not have diabetes-her recent labs show A1c of 5.1%. Her 24 hour urine free cotisol is normal at 14.5, rules out Cushing's synrome.  She following with Jearld Fenton, CDE. Due to her legitimate concern on weight, I discussed bariatric surgery , however, she decided not to pursue that option.  -  Suggestion is made for her to avoid simple carbohydrates  from her diet including Cakes, Sweet Desserts / Pastries, Ice Cream, Soda (diet and regular), Sweet Tea, Candies, Chips, Cookies, Store Bought Juices, Alcohol in Excess of  1-2 drinks a day, Artificial Sweeteners, and "Sugar-free" Products. This will help patient to have stable blood glucose profile and potentially avoid unintended weight gain.  3. Vitamin D deficiency: She is to continue with vitamin D3 5000 units daily for the next 90 days.  4. Chronic heavy smoker:  She has COPD. She is  extensively counseled against smoking. She is not ready to quit.  - I advised patient to maintain close follow up with Rosita Fire, MD for primary care needs. Follow up plan: Return in about 6 months (around 03/02/2018) for follow up with pre-visit labs.  Glade Lloyd, MD Phone: [phone]  Fax: [phone]  -  This note was partially dictated with voice recognition software. Similar sounding words can be transcribed inadequately or may not   be corrected upon review.  08/31/2017, 12:54 PM

## 2017-08-31 NOTE — Patient Instructions (Signed)

## 2017-09-04 ENCOUNTER — Encounter: Payer: Self-pay | Admitting: Nutrition

## 2017-09-05 ENCOUNTER — Other Ambulatory Visit: Payer: Self-pay | Admitting: "Endocrinology

## 2017-09-11 ENCOUNTER — Telehealth: Payer: Self-pay | Admitting: Cardiology

## 2017-09-11 NOTE — Telephone Encounter (Signed)
Spoke w/ pt and requested that she send a manual transmission b/c her home monitor has not updated in at least 14 days.   

## 2017-09-12 ENCOUNTER — Other Ambulatory Visit (HOSPITAL_COMMUNITY): Payer: Self-pay | Admitting: Internal Medicine

## 2017-09-12 DIAGNOSIS — Z1231 Encounter for screening mammogram for malignant neoplasm of breast: Secondary | ICD-10-CM

## 2017-09-18 ENCOUNTER — Ambulatory Visit (INDEPENDENT_AMBULATORY_CARE_PROVIDER_SITE_OTHER): Payer: Medicare Other | Admitting: *Deleted

## 2017-09-18 DIAGNOSIS — I639 Cerebral infarction, unspecified: Secondary | ICD-10-CM | POA: Diagnosis not present

## 2017-09-18 IMAGING — CR DG CHEST 1V PORT
1 series · 1 of 1 positions shown · non-contrast
Comparison: 05/21/2014

CLINICAL DATA: CVA, left facial droop

EXAM:
PORTABLE CHEST 1 VIEW

[ap portable]
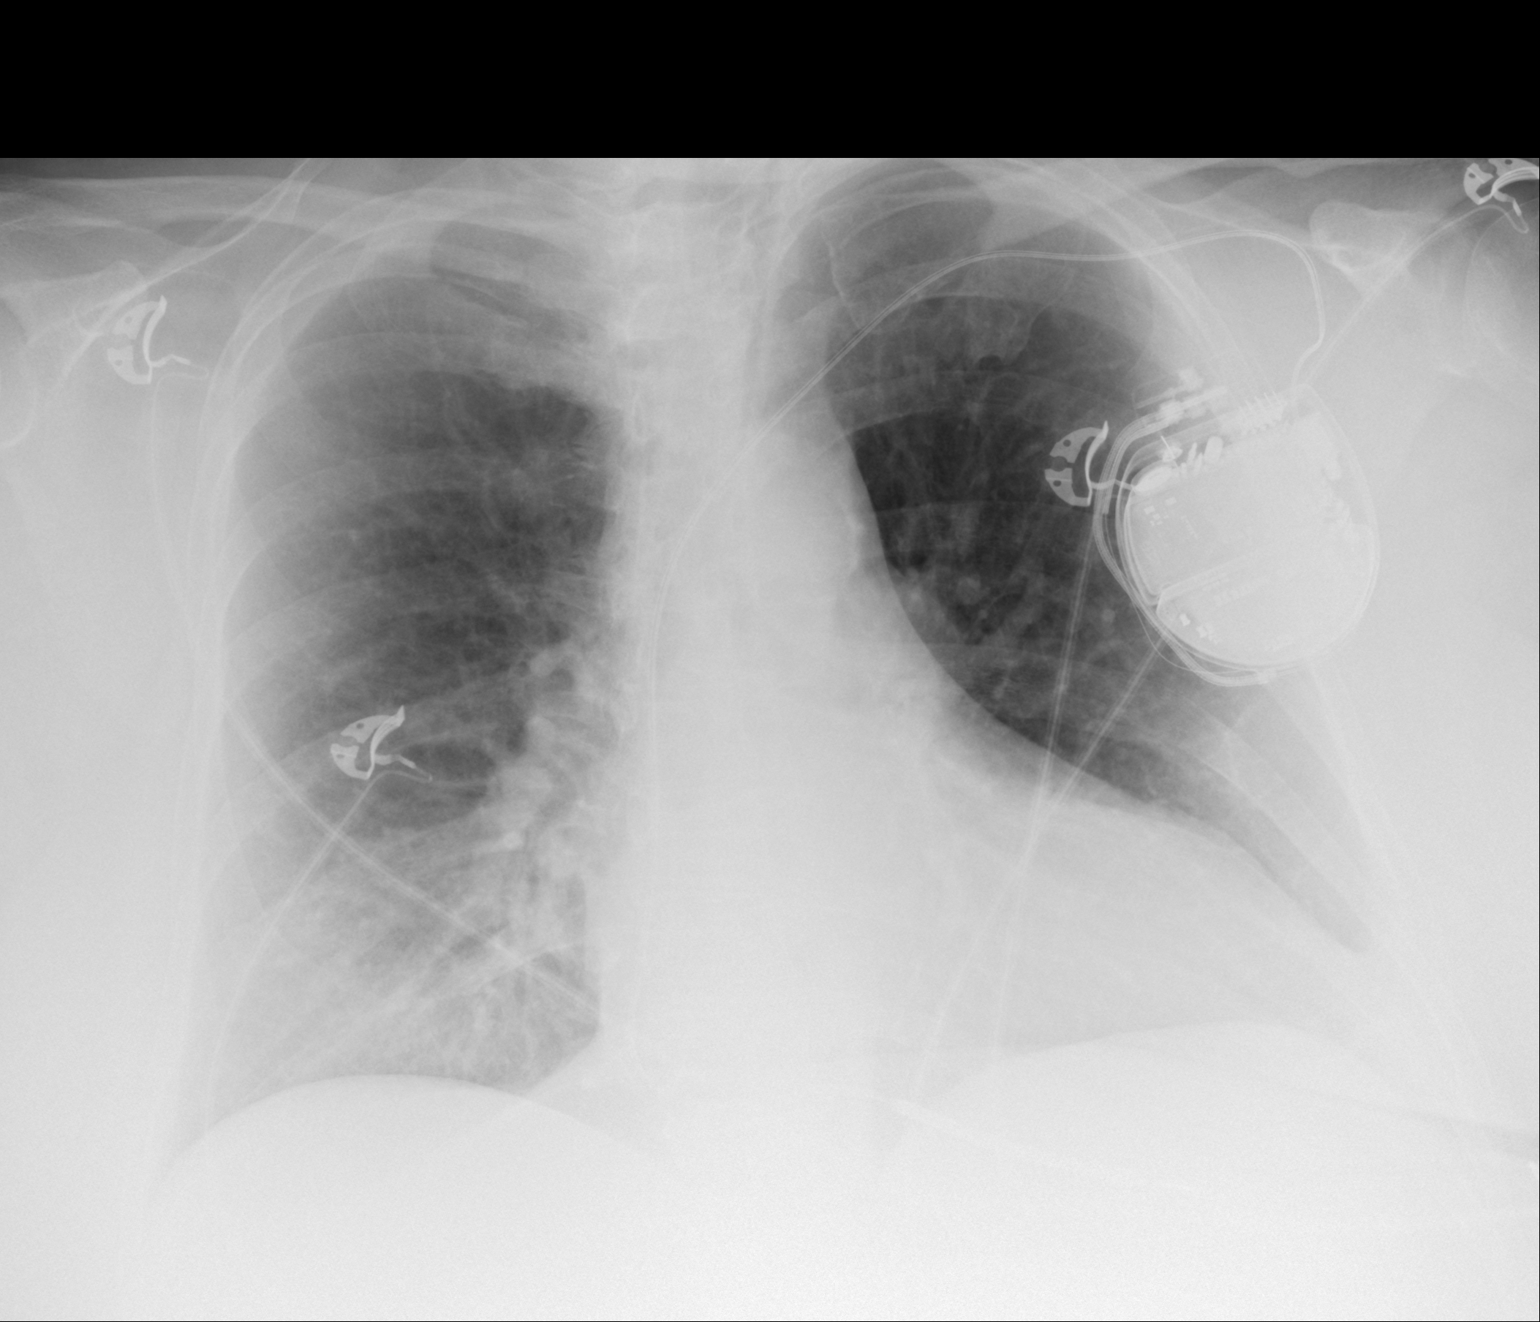

[1 of 1 positions shown; findings below may reference images not displayed]

FINDINGS: Borderline cardiomegaly. Single lead cardiac pacemaker is unchanged
in position. No acute infiltrate or pulmonary edema. Stable
bilateral basilar atelectasis or scarring.
IMPRESSION: Borderline cardiomegaly. Single lead cardiac pacemaker is unchanged
in position. No infiltrate or pulmonary edema. Stable bilateral
basilar atelectasis or scarring.

## 2017-09-18 IMAGING — CT CT HEAD W/O CM
1 of 2 series · 16 of 30 positions shown, 20 images · non-contrast
Comparison: 05/21/2014 and 05/08/2013

CLINICAL DATA: Left-sided facial droop and left-sided weakness with
slurred speech since noon time.

EXAM:
CT HEAD WITHOUT CONTRAST
TECHNIQUE: Contiguous axial images were obtained from the base of the skull
through the vertex without intravenous contrast.

[Series 2: headtrauma 4.8 h37s · axial · 0.46mm/px · z∈[+96,+228]mm · 16 of 30 slices shown, 20 images]
[im 2/30  brain]
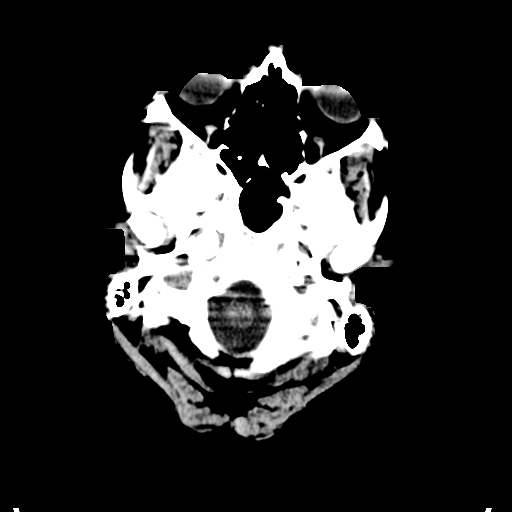
[im 2/30  bone]
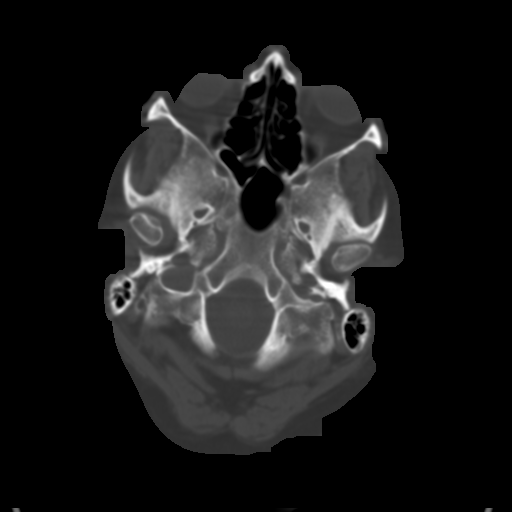
[im 3/30  brain]
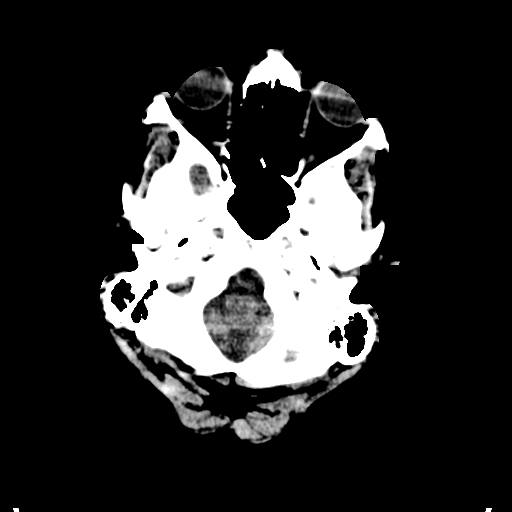
[im 5/30  brain]
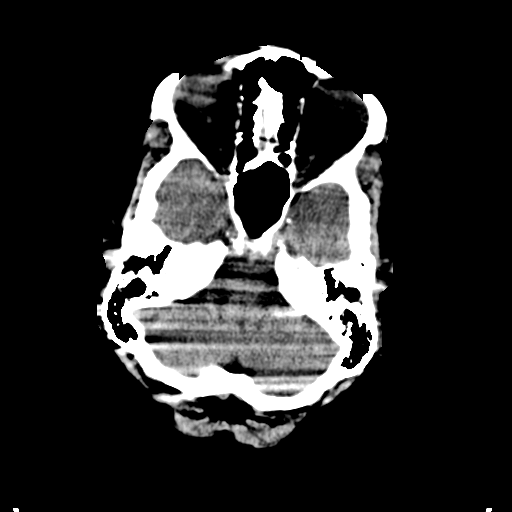
[im 8/30  brain]
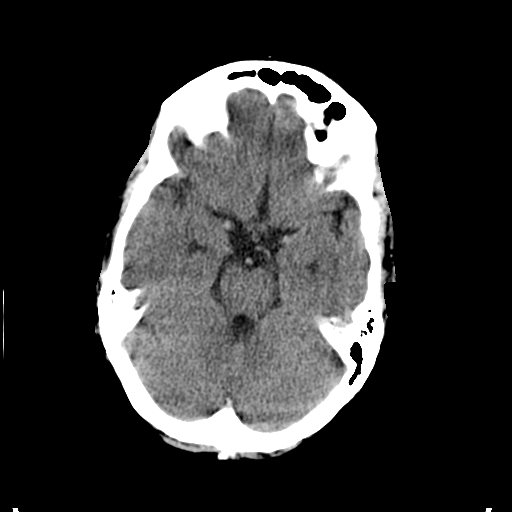
[im 9/30  brain]
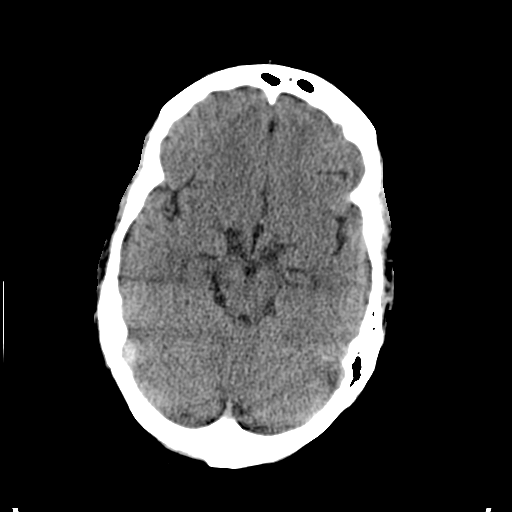
[im 9/30  bone]
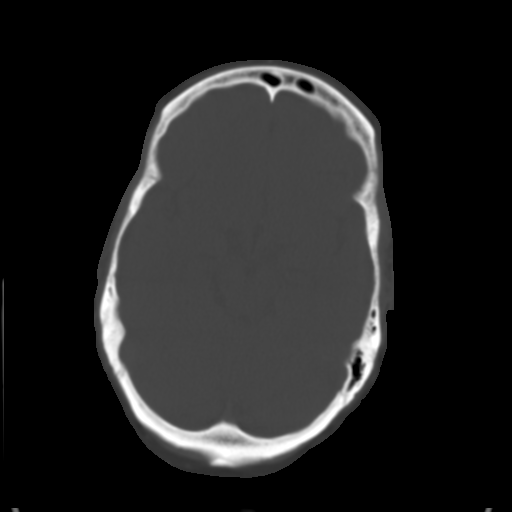
[im 11/30  brain]
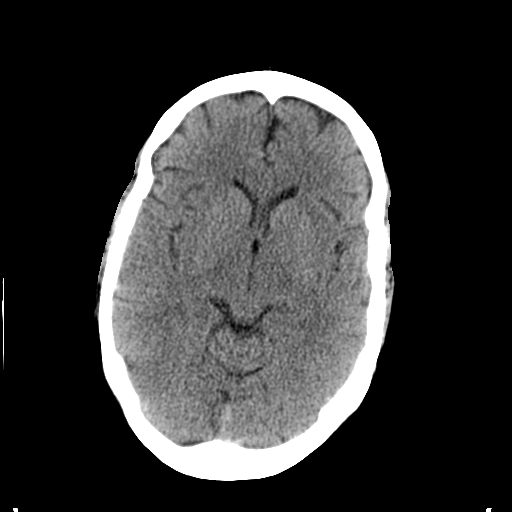
[im 12/30  brain]
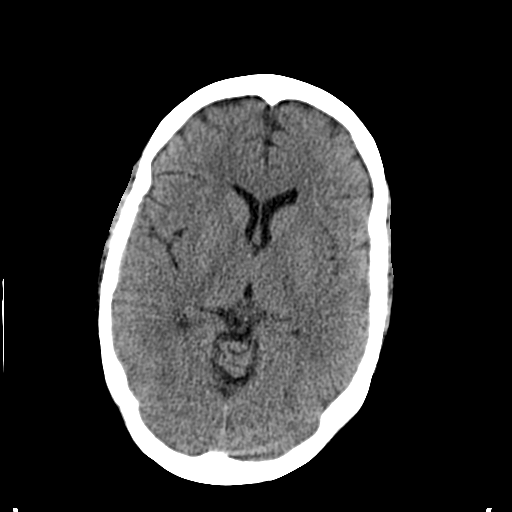
[im 14/30  brain]
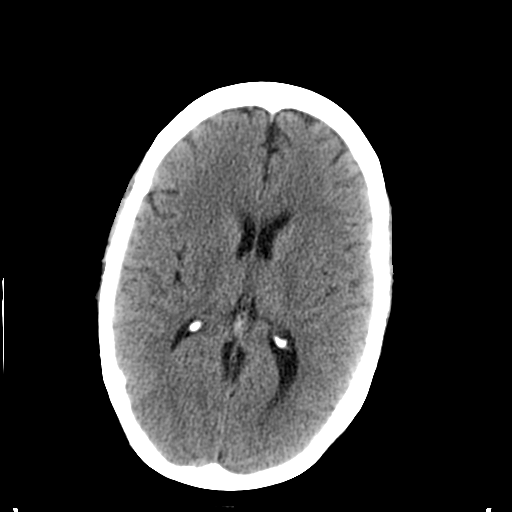
[im 16/30  brain]
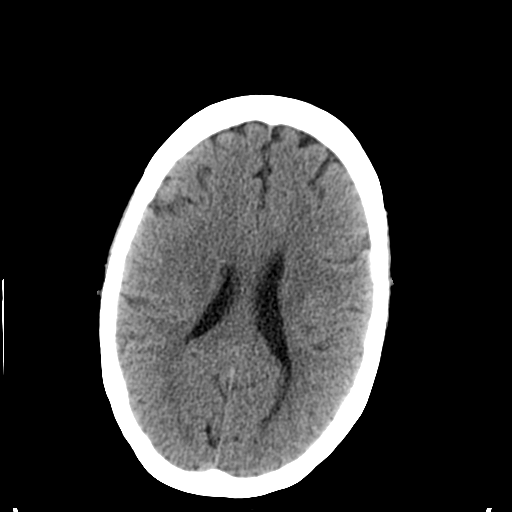
[im 16/30  bone]
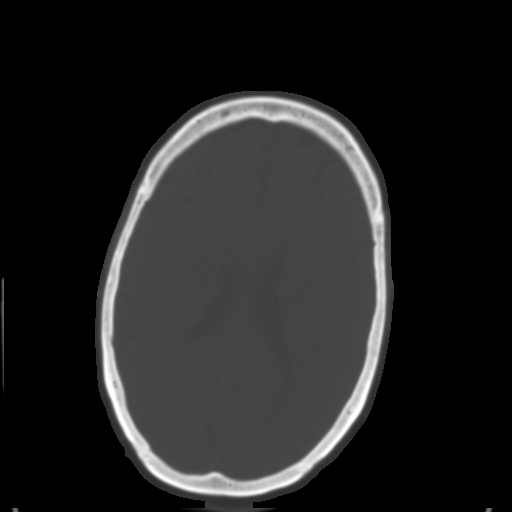
[im 18/30  brain]
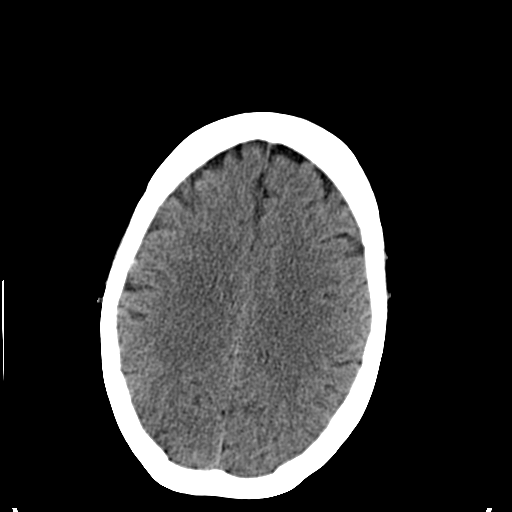
[im 19/30  brain]
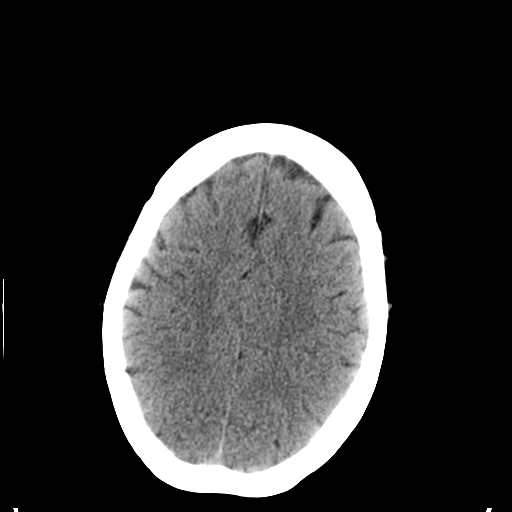
[im 21/30  brain]
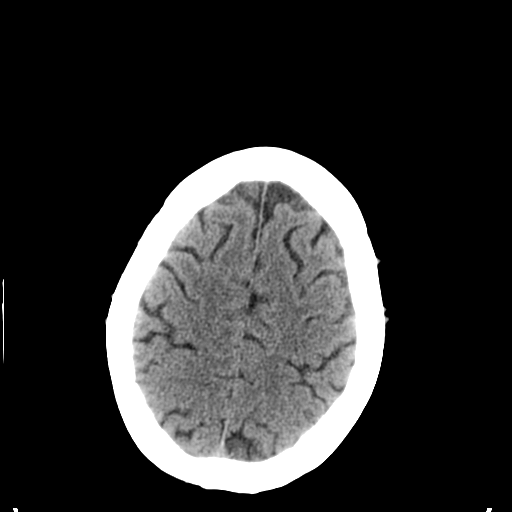
[im 22/30  brain]
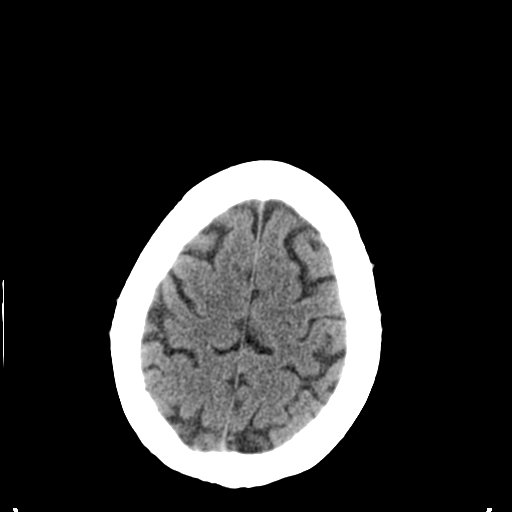
[im 22/30  bone]
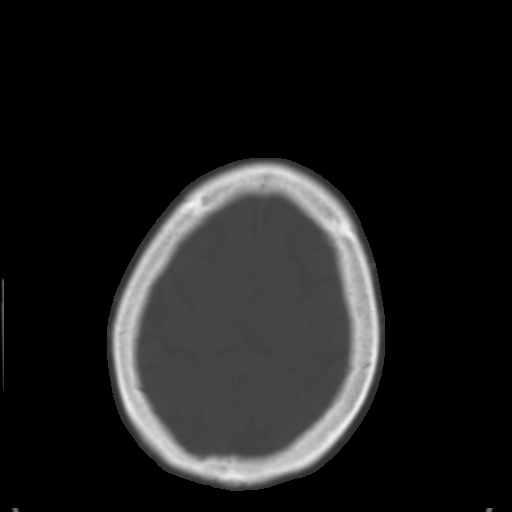
[im 25/30  brain]
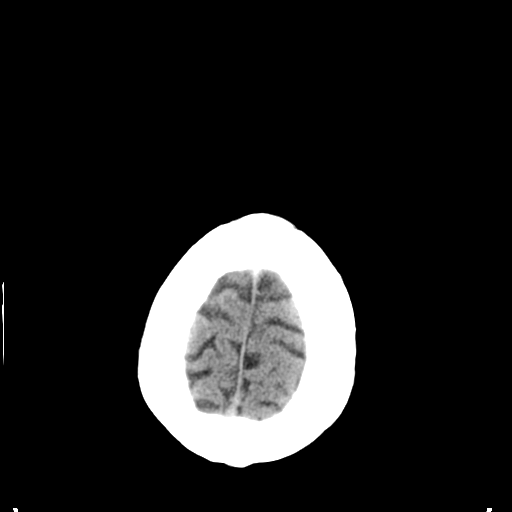
[im 27/30  brain]
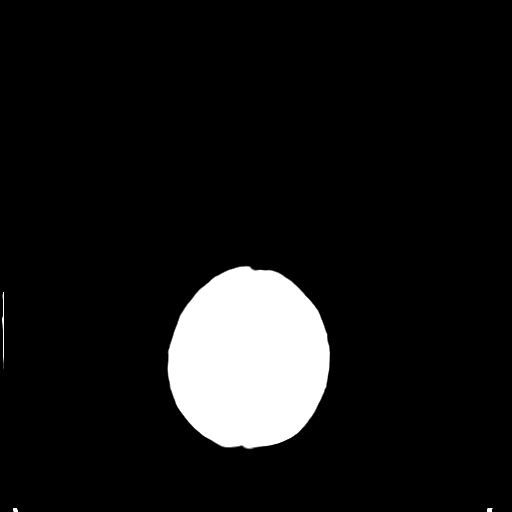
[im 28/30  brain]
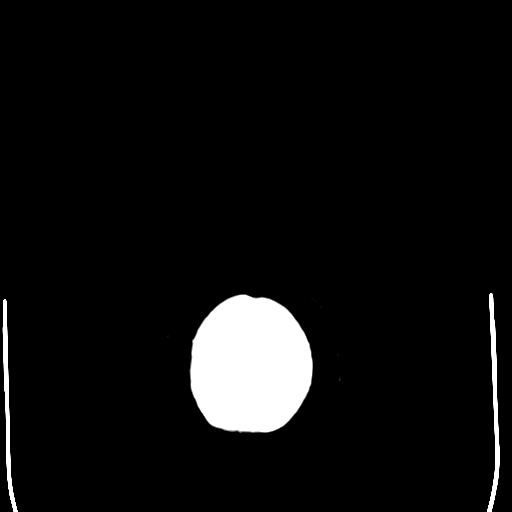

[16 of 30 positions shown; findings below may reference images not displayed]

FINDINGS: Ventricles, cisterns and other CSF spaces are within normal. There
is no mass, mass effect, shift of midline structures or acute
hemorrhage. There is no evidence of acute infarction. Remaining
bones soft tissues are within normal.
IMPRESSION: No acute intracranial findings.

These results were called by telephone at the time of interpretation
on 07/25/2015 at [DATE] to Dr. Baradulina, who verbally acknowledged
these results.

## 2017-09-19 NOTE — Progress Notes (Signed)
Carelink Summary Report / Loop Recorder 

## 2017-09-20 LAB — CUP PACEART REMOTE DEVICE CHECK
Date Time Interrogation Session: 20190604001029
Implantable Lead Model: 181
MDC IDC LEAD IMPLANT DT: 20130729
MDC IDC LEAD LOCATION: 753860
MDC IDC LEAD SERIAL: 322336
MDC IDC PG IMPLANT DT: 20170601

## 2017-09-22 ENCOUNTER — Other Ambulatory Visit: Payer: Self-pay | Admitting: "Endocrinology

## 2017-10-11 ENCOUNTER — Other Ambulatory Visit (HOSPITAL_COMMUNITY): Payer: Self-pay | Admitting: Internal Medicine

## 2017-10-11 ENCOUNTER — Ambulatory Visit (HOSPITAL_COMMUNITY)
Admission: RE | Admit: 2017-10-11 | Discharge: 2017-10-11 | Disposition: A | Discharge status: Home / Self Care | Payer: Medicare Other | Source: Ambulatory Visit | Attending: Internal Medicine | Admitting: Internal Medicine

## 2017-10-11 DIAGNOSIS — M25551 Pain in right hip: Secondary | ICD-10-CM | POA: Diagnosis present

## 2017-10-11 DIAGNOSIS — M1611 Unilateral primary osteoarthritis, right hip: Secondary | ICD-10-CM | POA: Insufficient documentation

## 2017-10-16 ENCOUNTER — Ambulatory Visit (HOSPITAL_COMMUNITY)
Admission: RE | Admit: 2017-10-16 | Discharge: 2017-10-16 | Disposition: A | Discharge status: Home / Self Care | Payer: Medicare Other | Source: Ambulatory Visit | Attending: Internal Medicine | Admitting: Internal Medicine

## 2017-10-16 ENCOUNTER — Other Ambulatory Visit (HOSPITAL_COMMUNITY): Payer: Self-pay | Admitting: Internal Medicine

## 2017-10-16 DIAGNOSIS — Z1231 Encounter for screening mammogram for malignant neoplasm of breast: Secondary | ICD-10-CM | POA: Diagnosis present

## 2017-10-19 ENCOUNTER — Ambulatory Visit (INDEPENDENT_AMBULATORY_CARE_PROVIDER_SITE_OTHER): Payer: Medicare Other | Admitting: *Deleted

## 2017-10-19 DIAGNOSIS — I429 Cardiomyopathy, unspecified: Secondary | ICD-10-CM

## 2017-10-19 DIAGNOSIS — I5022 Chronic systolic (congestive) heart failure: Secondary | ICD-10-CM | POA: Diagnosis not present

## 2017-10-20 NOTE — Progress Notes (Addendum)
[facility] Summary Report / Loop Recorder 

## 2017-10-21 LAB — CUP PACEART REMOTE DEVICE CHECK
Implantable Lead Implant Date: 20130729
Implantable Lead Location: 753860
Implantable Lead Model: 181
Implantable Lead Serial Number: 322336
Implantable Pulse Generator Implant Date: 20170601
MDC IDC SESS DTM: 20190707003718

## 2017-10-23 ENCOUNTER — Other Ambulatory Visit: Payer: Medicare Other | Admitting: Adult Health

## 2017-10-26 ENCOUNTER — Ambulatory Visit (HOSPITAL_COMMUNITY): Payer: Medicare Other | Attending: Neurology

## 2017-10-26 ENCOUNTER — Encounter (HOSPITAL_COMMUNITY): Payer: Self-pay

## 2017-10-26 ENCOUNTER — Other Ambulatory Visit: Payer: Self-pay

## 2017-10-26 DIAGNOSIS — R2681 Unsteadiness on feet: Secondary | ICD-10-CM | POA: Diagnosis present

## 2017-10-26 DIAGNOSIS — R296 Repeated falls: Secondary | ICD-10-CM | POA: Insufficient documentation

## 2017-10-26 DIAGNOSIS — R2689 Other abnormalities of gait and mobility: Secondary | ICD-10-CM | POA: Insufficient documentation

## 2017-10-26 DIAGNOSIS — M5431 Sciatica, right side: Secondary | ICD-10-CM | POA: Insufficient documentation

## 2017-10-26 DIAGNOSIS — M5416 Radiculopathy, lumbar region: Secondary | ICD-10-CM | POA: Insufficient documentation

## 2017-10-26 NOTE — Therapy (Signed)
[place], [place], [address] Phone: [phone]   Fax:  [phone]  Physical Therapy Evaluation  Patient Details  Name: Melanie Cordova MRN: [id number] Date of Birth: Oct 19, 1960 Referring Provider: Ladene Artist, NP   Encounter Date: 10/26/2017  PT End of Session - 10/26/17 1244    Visit Number  1    Number of Visits  16    Date for PT Re-Evaluation  12/21/17   mini-re-assess 11/23/2017   Authorization Type  UHC Medicare no copay; Medicaid [place] A    Authorization - Visit Number  1    Authorization - Number of Visits  10    PT Start Time  1324    PT Stop Time  1210    PT Time Calculation (min)  52 min    Activity Tolerance  Patient limited by pain    Behavior During Therapy  [facility] for tasks assessed/performed       Past Medical History:  Diagnosis Date  . Anxiety   . Arthritis   . Asthma   . Automatic implantable cardioverter-defibrillator in situ    2013, july  . Bell palsy    states has had 3 episodes  . Cardiomyopathy 08/2010   Presented with congestive heart failure; EF of 15% and 2012; hypotension on medication precludes optimal dosing  . CHF (congestive heart failure) ([place])   . Chronic systolic heart failure ([place])   . COPD (chronic obstructive pulmonary disease) ([place])    2013  . Dysrhythmia   . Gastroesophageal reflux disease   . Headache(784.0)   . Hot flashes 10/04/2016  . Hyperlipidemia   . Hypertension    09/2010-normal CMet and CBC; Lipid profile-116, 88, 25, 73  . Hypothyroidism    Recent TSH was normal.  . 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 Neuromuscular disorder (HCC)    Neuropathy, right foot and leg  . Neuropathy   . Obesity   . Pneumonia   . Seizures ([place])    last one at age 42  . Shortness of breath   . Stroke [facility])     stroke in 07/2012 and another in June, 2014  . Tobacco abuse    20 pack years    Past Surgical History:  Procedure  Laterality Date  . CESAREAN SECTION     X2  . COLONOSCOPY N/A 12/16/2013   Procedure: COLONOSCOPY;  Surgeon: Danie Binder, MD;  [facility];  Service: Endoscopy;  Laterality: N/A;  10:45-moved to 10/5 @ [place] notified pt  . EP IMPLANTABLE DEVICE     St. Jude  . EP IMPLANTABLE DEVICE N/A 08/13/2015   Procedure: Loop Recorder Insertion;  Surgeon: [place] Lance, MD;  Location: [place] CV LAB;  Service: Cardiovascular;  Laterality: N/A;  . IMPLANTABLE CARDIOVERTER DEFIBRILLATOR IMPLANT N/A 10/10/2011   Procedure: IMPLANTABLE CARDIOVERTER DEFIBRILLATOR IMPLANT;  Surgeon: [place] Lance, MD;  Location: [facility] CATH LAB;  Service: Cardiovascular;  Laterality: N/A;  . MULTIPLE EXTRACTIONS WITH ALVEOLOPLASTY N/A 09/24/2012   Procedure: MULTIPLE EXTRACION #2, 4, 6, 7 ,8, 9, 11, 13, 18, 20, 21, 22, 23, 24, 25, 26, 27, 29 WITH ALVEOLOPLASTY, BIOPSY OF PALATE LESION, REMOVA RIGHT LINGUAL TORUS;  Surgeon: Gae Bon, DDS;  Location: [place];  Service: Oral Surgery;  Laterality: N/A;  . TEE WITHOUT CARDIOVERSION N/A 07/11/2012   Procedure: TRANSESOPHAGEAL ECHOCARDIOGRAM (TEE);  Surgeon: Thayer Headings, MD;  Location: [place];  Service:  Cardiovascular;  Laterality: N/A;  . TUBAL LIGATION      There were no vitals filed for this visit.   Subjective Assessment - 10/26/17 1133    Subjective  Patient states difficulty standing up from chair, needs her cane and armrest to stand and make sure her Rt leg and low back are going to support and aren't too painful or numb. Is limited in sitting and driving a car as well as walking is limited to a room. Any position for too long is painful. Personal care assistant comes in 2 hours per day 5 days per week to help with cooking, cleaning, dressing or bathing if she needs help.     Pertinent History  OA, COPD, CHF, HTN, stroke, h/o seizures, refluc, back pain, medicine allergies, prior surgery, defibrillator implant.    Limitations   Sitting;Lifting;Standing;Walking;House hold activities    How long can you sit comfortably?  30 minutes    How long can you stand comfortably?  10 minutes    How long can you walk comfortably?  depends, sometimes can't walk upon standing because Rt leg too painful; sometimes can walk a max of 15 minutes because of pain and fatigue.    Patient Stated Goals  to be able to do more for myself    Currently in Pain?  Yes    Pain Score  8     Pain Location  Back    Pain Orientation  Right;Left;Posterior    Pain Radiating Towards  Rt hip and foot; Rt lateral ankle and 4th and 5th toe numb    Aggravating Factors   any position too long, sit, stand, walk, supine, side-lying    Pain Relieving Factors  pain meds, changing position    Effect of Pain on Daily Activities  limits ability to be active and walk         [facility] PT Assessment - 10/26/17 0001      Assessment   Medical Diagnosis  lumbar radiculopathy, R LE sciatica    Referring Provider  Ladene Artist, NP    Onset Date/Surgical Date  09/26/17    Hand Dominance  Left    Next MD Visit  October 2019    Prior Therapy  No prior PT      Precautions   Precautions  None      Restrictions   Weight Bearing Restrictions  No      Balance Screen   Has the patient fallen in the past 6 months  Yes    How many times?  3    Has the patient had a decrease in activity level because of a fear of falling?   Yes    Is the patient reluctant to leave their home because of a fear of falling?   Yes   doesn't want to go anywhere by herself in case she falls.     Home Environment   Living Environment  Private residence    Living Arrangements  Alone    Type of Home  Apartment      Prior Function   Vocation  On disability    Leisure  walking, bea ble to go shopping      Cognition   Overall Cognitive Status  Within Functional Limits for tasks assessed      Observation/Other Assessments   Focus on Therapeutic Outcomes (FOTO)   73% limited       Posture/Postural Control   Posture/Postural Control  Postural limitations    Postural Limitations  Rounded Shoulders;Forward head;Flexed trunk;Weight shift left    Posture Comments  repeated standing lumbar extension with UE support x8 decreased radicular symptoms      Transfers   Five time sit to stand comments   1:32.25 sec      Ambulation/Gait   Ambulation Distance (Feet)  92 Feet   2 MWT   Assistive device  Straight cane    Gait Pattern  Step-through pattern;Decreased hip/knee flexion - right;Decreased hip/knee flexion - left;Decreased stance time - right;Right foot flat;Left foot flat;Shuffle;Antalgic;Lateral trunk lean to left;Trunk flexed    Ambulation Surface  Level    Gait velocity  0.25 m/s                Objective measurements completed on examination: See above findings.              PT Education - 10/26/17 1240    Education Details  educated on [place] of PT to increase function and nerve paths connected LBP and numb feeling in Rt foot and Rt hip       PT Short Term Goals - 10/26/17 1256      PT SHORT TERM GOAL #1   Title  Patient will be independent in HEP and perform as instructed regularly.    Time  4    Period  Weeks    Status  New    Target Date  11/23/17      PT SHORT TERM GOAL #2   Title  Patient will increase gait velocity to 0.40 m/s to indicate decreased risk of falls and improve safety.    Baseline  initial - 0.25 m/s    Time  4    Period  Weeks    Status  New      PT SHORT TERM GOAL #3   Title  Patient will be able to perform 5xSTS in < 1:10 sec to indicate more fluid movement and decreased symptoms.     Baseline  initial - 1:32.25 sec      PT SHORT TERM GOAL #4   Title  Patient will report pain level at no greater than 6/10 to indicate reduction in symptoms and therefore improved functional ability.     Time  4    Period  Weeks    Status  New        PT Long Term Goals - 10/26/17 1419      PT LONG TERM GOAL #1   Title   Patient will be independent in advanced HEP and perform as instructed regularly.    Time  8    Period  Weeks    Status  New    Target Date  12/21/17      PT LONG TERM GOAL #2   Title  Patient will increase gait velocity to 0.40 m/s to indicate decreased risk of falls and improve safety.    Time  8    Period  Weeks    Status  New      PT LONG TERM GOAL #3   Title  Patient will be able to perform 5xSTS in < 0:45 sec to indicate more fluid movement and decreased symptoms.     Time  8    Period  Weeks    Status  New      PT LONG TERM GOAL #4   Title  Patient will report pain level at no greater than 3/10 to indicate reduction in symptoms and therefore improved functional ability.  Time  8    Period  Weeks    Status  New             Plan - 10/26/17 1249    Clinical Impression Statement  Patient is a 57 year old female with complaints of LBP with right sided hip pain and numbness in lateral ankle and 4thad 5th toes. Patient's functional ability is quite limited with a FOTO score of 73% limited. Patient's radicular symptoms did decrease with repeated extension in standing. Patient unable to tolerate prone positioning for McKenzie exercises 1-3. Patient exhibits impaired function, gait deficits, impaired edurance, and overal decreased mobility and increased pain complaints. Patient would benefit from skilled physical therapy to address the aforementioned deficits with a focus on pain reduction and core and LE strengthening. Patient may benefit from a RW of Rollator to increase her ability to walk longer distances and in a safer manner.     History and Personal Factors relevant to plan of care:  OA, COPD, claustrophobic, asthma, CHD, HTN, stroke, seizure, reflux, back pain, allergies, defibrillator    Clinical Presentation due to:  FOTO, gait, 2MWT, 5xSTS, clinical reasoning.    Clinical Decision Making  Moderate    Rehab Potential  Fair    Clinical Impairments Affecting Rehab  Potential  positive - prior activity level walking 3x/wk; negative - severity of pain, instability walking, comorbidity    PT Frequency  2x / week    PT Duration  8 weeks    PT Treatment/Interventions  ADLs/Self Care Home Management;DME Instruction;Gait training;Functional mobility training;Therapeutic activities;Therapeutic exercise;Balance training;Neuromuscular re-education;Patient/family education;Manual techniques;Passive range of motion;Dry needling;Energy conservation    PT Next Visit Plan  assess lumbar spine AROM, slump test, MMT; initiate HEP with supine core stabilization exercises beginning with ab set, glut set; note unable to lie prone    Recommended Other Services  possible RW or Rollator?    Consulted and Agree with Plan of Care  Patient       Patient will benefit from skilled therapeutic intervention in order to improve the following deficits and impairments:  Abnormal gait, Decreased endurance  Visit Diagnosis: Sciatica, right side  Radiculopathy, lumbar region  Repeated falls  Unsteadiness on feet  Other abnormalities of gait and mobility     Problem List Patient Active Problem List   Diagnosis Date Noted  . Rectocele 10/04/2016  . Vaginal discharge 10/04/2016  . Screening for colorectal cancer 10/04/2016  . Hot flashes 10/04/2016  . Vitamin D deficiency 02/26/2016  . Cryptogenic stroke ([place]) 08/13/2015  . Weakness of face muscles 07/25/2015  . Morbid obesity due to excess calories ([place]) 03/05/2015  . Rectal bleeding 11/05/2013  . Chronic systolic heart failure ([place]) 05/15/2013  . Asthma 04/29/2013  . Migraine variant 11/24/2012  . Weakness 11/15/2012  . Gingival disease 07/25/2012  . TIA (transient ischemic attack) 11/07/2011  . Automatic implantable cardioverter-defibrillator in situ 10/12/2011  . Hypokalemia 10/01/2011  . Tobacco abuse   . Hyperlipidemia   . Hypertension   . Gastroesophageal reflux disease   . Hypothyroidism   . Cardiomyopathy  ([place]) 08/13/2010    Floria Raveling. Hartnett-Rands, MS, PT [facility] #[id number] 10/26/2017, 2:27 PM  [place] [address] [place], [place], [address] Phone: [phone]   Fax:  [phone]  Name: Melanie Cordova MRN: [id number] Date of Birth: 03-24-1960

## 2017-11-02 ENCOUNTER — Ambulatory Visit (HOSPITAL_COMMUNITY): Payer: Medicare Other

## 2017-11-02 ENCOUNTER — Other Ambulatory Visit: Payer: Self-pay

## 2017-11-02 ENCOUNTER — Encounter (HOSPITAL_COMMUNITY): Payer: Self-pay

## 2017-11-02 DIAGNOSIS — R2689 Other abnormalities of gait and mobility: Secondary | ICD-10-CM

## 2017-11-02 DIAGNOSIS — M5431 Sciatica, right side: Secondary | ICD-10-CM

## 2017-11-02 DIAGNOSIS — R2681 Unsteadiness on feet: Secondary | ICD-10-CM

## 2017-11-02 DIAGNOSIS — M5416 Radiculopathy, lumbar region: Secondary | ICD-10-CM

## 2017-11-02 DIAGNOSIS — R296 Repeated falls: Secondary | ICD-10-CM

## 2017-11-02 NOTE — Therapy (Signed)
[place], [place], [address] Phone: [phone]   Fax:  [phone]  Physical Therapy Treatment  Patient Details  Name: Melanie Cordova MRN: [id number] Date of Birth: 05-May-1960 Referring Provider: Ladene Artist, NP   Encounter Date: 11/02/2017  PT End of Session - 11/02/17 0945    Visit Number  2    Number of Visits  16    Date for PT Re-Evaluation  12/21/17   mini-re-assess 11/23/2017   Authorization Type  UHC Medicare no copay; Medicaid [place] A    Authorization - Visit Number  2    Authorization - Number of Visits  10    PT Start Time  0905    PT Stop Time  0945    PT Time Calculation (min)  40 min    Activity Tolerance  Patient limited by pain    Behavior During Therapy  [facility] for tasks assessed/performed       Past Medical History:  Diagnosis Date  . Anxiety   . Arthritis   . Asthma   . Automatic implantable cardioverter-defibrillator in situ    2013, july  . Bell palsy    states has had 3 episodes  . Cardiomyopathy 08/2010   Presented with congestive heart failure; EF of 15% and 2012; hypotension on medication precludes optimal dosing  . CHF (congestive heart failure) ([place])   . Chronic systolic heart failure ([place])   . COPD (chronic obstructive pulmonary disease) ([place])    2013  . Dysrhythmia   . Gastroesophageal reflux disease   . Headache(784.0)   . Hot flashes 10/04/2016  . Hyperlipidemia   . Hypertension    09/2010-normal CMet and CBC; Lipid profile-116, 88, 25, 73  . Hypothyroidism    Recent TSH was normal.  . 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 Neuromuscular disorder (HCC)    Neuropathy, right foot and leg  . Neuropathy   . Obesity   . Pneumonia   . Seizures ([place])    last one at age 41  . Shortness of breath   . Stroke [facility])     stroke in 07/2012 and another in June, 2014  . Tobacco abuse    20 pack years    Past Surgical History:  Procedure  Laterality Date  . CESAREAN SECTION     X2  . COLONOSCOPY N/A 12/16/2013   Procedure: COLONOSCOPY;  Surgeon: Danie Binder, MD;  [facility];  Service: Endoscopy;  Laterality: N/A;  10:45-moved to 10/5 @ [place] notified pt  . EP IMPLANTABLE DEVICE     St. Jude  . EP IMPLANTABLE DEVICE N/A 08/13/2015   Procedure: Loop Recorder Insertion;  Surgeon: [place] Lance, MD;  Location: [place] CV LAB;  Service: Cardiovascular;  Laterality: N/A;  . IMPLANTABLE CARDIOVERTER DEFIBRILLATOR IMPLANT N/A 10/10/2011   Procedure: IMPLANTABLE CARDIOVERTER DEFIBRILLATOR IMPLANT;  Surgeon: [place] Lance, MD;  Location: [facility] CATH LAB;  Service: Cardiovascular;  Laterality: N/A;  . MULTIPLE EXTRACTIONS WITH ALVEOLOPLASTY N/A 09/24/2012   Procedure: MULTIPLE EXTRACION #2, 4, 6, 7 ,8, 9, 11, 13, 18, 20, 21, 22, 23, 24, 25, 26, 27, 29 WITH ALVEOLOPLASTY, BIOPSY OF PALATE LESION, REMOVA RIGHT LINGUAL TORUS;  Surgeon: Gae Bon, DDS;  Location: [place];  Service: Oral Surgery;  Laterality: N/A;  . TEE WITHOUT CARDIOVERSION N/A 07/11/2012   Procedure: TRANSESOPHAGEAL ECHOCARDIOGRAM (TEE);  Surgeon: Thayer Headings, MD;  Location: [place];  Service:  Cardiovascular;  Laterality: N/A;  . TUBAL LIGATION      There were no vitals filed for this visit.  Subjective Assessment - 11/02/17 0910    Subjective  Patient reports she is having alot of pain still and that it is difficult to stand, walk, lay down. She states she has a lot of difficulty laying on her stomach but agrees to do so for testing today.    Pertinent History  OA, COPD, CHF, HTN, stroke, h/o seizures, refluc, back pain, medicine allergies, prior surgery, defibrillator implant.    Limitations  Sitting;Lifting;Standing;Walking;House hold activities    How long can you sit comfortably?  30 minutes    How long can you stand comfortably?  10 minutes    How long can you walk comfortably?  depends, sometimes can't walk upon standing because Rt  leg too painful; sometimes can walk a max of 15 minutes because of pain and fatigue.    Patient Stated Goals  to be able to do more for myself    Currently in Pain?  Yes    Pain Score  7     Pain Location  Buttocks    Pain Orientation  Right;Left;Upper;Posterior    Pain Descriptors / Indicators  Aching    Pain Type  Chronic pain    Pain Radiating Towards  Rt hip to foot, Rt 4th and 5th digits on foot    Pain Onset  More than a month ago    Pain Frequency  Constant    Aggravating Factors   any prolonged position    Pain Relieving Factors  medicine        [facility] PT Assessment - 11/02/17 0001      ROM / Strength   AROM / PROM / Strength  Strength;AROM      AROM   AROM Assessment Site  Lumbar    Lumbar Flexion  60    Lumbar Extension  10    Lumbar - Right Side Bend  8    Lumbar - Left Side Bend  12    Lumbar - Right Rotation  0    Lumbar - Left Rotation  6      Strength   Strength Assessment Site  Hip;Knee;Ankle    Right Hip Flexion  2+/5   pain   Right Hip Extension  2/5    Right Hip ABduction  3+/5    Left Hip Flexion  3/5    Left Hip Extension  3+/5    Left Hip ABduction  3+/5    Right/Left Knee  Right;Left    Right Knee Flexion  3+/5    Right Knee Extension  3-/5    Left Knee Flexion  4-/5    Left Knee Extension  4+/5    Right Ankle Dorsiflexion  3-/5    Left Ankle Dorsiflexion  4+/5      Special Tests    Special Tests  Lumbar    Lumbar Tests  Slump Test;Straight Leg Raise      Slump test   Findings  Positive    Side  Right      Straight Leg Raise   Findings  Positive    Side   Right       [facility] Adult PT Treatment/Exercise - 11/02/17 0001      Exercises   Exercises  Lumbar      Lumbar Exercises: Seated   Other Seated Lumbar Exercises  heel raises/toe raises, 1x 10 each with 2-3 seconds holds  Lumbar Exercises: Supine   Ab Set  15 reps;3 seconds    Glut Set  15 reps;3 seconds    Clam  15 reps    Clam Limitations  no theraband        PT  Education - 11/02/17 1244    Education Details  Reviewed eval and goals with patient. Edcuated on possible sources of pain and on exercises to initiate HEP.    Person(s) Educated  Patient    Methods  Explanation;Verbal cues;Handout    Comprehension  Verbalized understanding;Returned demonstration       PT Short Term Goals - 11/02/17 1245      PT SHORT TERM GOAL #1   Title  Patient will be independent in HEP and perform as instructed regularly.    Time  4    Period  Weeks    Status  On-going      PT SHORT TERM GOAL #2   Title  Patient will increase gait velocity to 0.40 m/s to indicate decreased risk of falls and improve safety.    Baseline  initial - 0.25 m/s    Time  4    Period  Weeks    Status  On-going      PT SHORT TERM GOAL #3   Title  Patient will be able to perform 5xSTS in < 1:10 sec to indicate more fluid movement and decreased symptoms.     Baseline  initial - 1:32.25 sec    Time  4    Period  Weeks    Status  On-going      PT SHORT TERM GOAL #4   Title  Patient will report pain level at no greater than 6/10 to indicate reduction in symptoms and therefore improved functional ability.     Time  4    Period  Weeks    Status  On-going        PT Long Term Goals - 11/02/17 1245      PT LONG TERM GOAL #1   Title  Patient will be independent in advanced HEP and perform as instructed regularly.    Time  8    Period  Weeks    Status  On-going      PT LONG TERM GOAL #2   Title  Patient will increase gait velocity to 0.40 m/s to indicate decreased risk of falls and improve safety.    Time  8    Period  Weeks    Status  On-going      PT LONG TERM GOAL #3   Title  Patient will be able to perform 5xSTS in < 0:45 sec to indicate more fluid movement and decreased symptoms.     Time  8    Period  Weeks    Status  On-going      PT LONG TERM GOAL #4   Title  Patient will report pain level at no greater than 3/10 to indicate reduction in symptoms and therefore  improved functional ability.     Time  8    Period  Weeks    Status  On-going        Plan - 11/02/17 1046    Clinical Impression Statement  Reviewed eval and goals at start of session and performed testing for bil LE and lumbar ROM to complete objective measures unable to test at evaluation. Patient has weakness bil LE's, Rt> LT, and has pain exacerbation with resistance testing. She initiated cores stabilization exercises in supine/hooklying  position to reduce stress on lumbar spine. Pain was exasperated by [facility] stretch and this was discontinued after 2 attempts. She will continue to benefit from skilled PT interventions to address impairments and improve mobility while reducing pain.    Rehab Potential  Fair    Clinical Impairments Affecting Rehab Potential  positive - prior activity level walking 3x/wk; negative - severity of pain, instability walking, comorbidity    PT Frequency  2x / week    PT Duration  8 weeks    PT Treatment/Interventions  ADLs/Self Care Home Management;DME Instruction;Gait training;Functional mobility training;Therapeutic activities;Therapeutic exercise;Balance training;Neuromuscular re-education;Patient/family education;Manual techniques;Passive range of motion;Dry needling;Energy conservation    PT Next Visit Plan  Review HEP with supine core stabilization exercises and add LTR if tolerated and hamstring isometric with exercise ball. Consider adding sciatic nerve glide.    PT Home Exercise Plan  11/02/17 - glut set, ab set, clamshell supine;     Consulted and Agree with Plan of Care  Patient       Patient will benefit from skilled therapeutic intervention in order to improve the following deficits and impairments:  Abnormal gait, Decreased endurance  Visit Diagnosis: Sciatica, right side  Radiculopathy, lumbar region  Repeated falls  Unsteadiness on feet  Other abnormalities of gait and mobility     Problem List Patient Active Problem List   Diagnosis  Date Noted  . Rectocele 10/04/2016  . Vaginal discharge 10/04/2016  . Screening for colorectal cancer 10/04/2016  . Hot flashes 10/04/2016  . Vitamin D deficiency 02/26/2016  . Cryptogenic stroke ([place]) 08/13/2015  . Weakness of face muscles 07/25/2015  . Morbid obesity due to excess calories ([place]) 03/05/2015  . Rectal bleeding 11/05/2013  . Chronic systolic heart failure ([place]) 05/15/2013  . Asthma 04/29/2013  . Migraine variant 11/24/2012  . Weakness 11/15/2012  . Gingival disease 07/25/2012  . TIA (transient ischemic attack) 11/07/2011  . Automatic implantable cardioverter-defibrillator in situ 10/12/2011  . Hypokalemia 10/01/2011  . Tobacco abuse   . Hyperlipidemia   . Hypertension   . Gastroesophageal reflux disease   . Hypothyroidism   . Cardiomyopathy ([place]) 08/13/2010    Kipp Brood, PT, DPT Physical Therapist with [facility]  11/02/2017 12:50 PM    [facility] [address] [place], [place], [address] Phone: [phone]   Fax:  [phone]  Name: Melanie Cordova MRN: [id number] Date of Birth: 16-Jun-1960

## 2017-11-02 NOTE — Patient Instructions (Signed)
  Exercises  Supine Transversus Abdominis Bracing - Hands on Ground - 10 reps - 2 sets - 3 hold - 1x daily - 7x weekly  Hooklying Gluteal Sets - 10 reps - 2 sets - 3 hold - 1x daily - 7x weekly  Bent Knee Fallouts - 10 reps - 2 sets - 1x daily - 7x weekly

## 2017-11-03 ENCOUNTER — Ambulatory Visit (HOSPITAL_COMMUNITY): Payer: Medicare Other

## 2017-11-03 ENCOUNTER — Encounter (HOSPITAL_COMMUNITY): Payer: Self-pay

## 2017-11-03 ENCOUNTER — Other Ambulatory Visit: Payer: Self-pay

## 2017-11-03 DIAGNOSIS — M5431 Sciatica, right side: Secondary | ICD-10-CM

## 2017-11-03 DIAGNOSIS — R296 Repeated falls: Secondary | ICD-10-CM

## 2017-11-03 DIAGNOSIS — R2681 Unsteadiness on feet: Secondary | ICD-10-CM

## 2017-11-03 DIAGNOSIS — M5416 Radiculopathy, lumbar region: Secondary | ICD-10-CM

## 2017-11-03 DIAGNOSIS — R2689 Other abnormalities of gait and mobility: Secondary | ICD-10-CM

## 2017-11-03 NOTE — Therapy (Signed)
[place], [place], [address] Phone: [phone]   Fax:  [phone]  Physical Therapy Treatment  Patient Details  Name: Melanie Cordova MRN: [id number] Date of Birth: [birth year] Referring Provider: Ladene Artist, NP   Encounter Date: 11/03/2017  PT End of Session - 11/03/17 1129    Visit Number  3    Number of Visits  16    Date for PT Re-Evaluation  12/21/17   mini-re-assess 11/23/2017   Authorization Type  UHC Medicare no copay; Medicaid [place] A    Authorization - Visit Number  3    Authorization - Number of Visits  10    PT Start Time  1122    PT Stop Time  1201    PT Time Calculation (min)  39 min    Activity Tolerance  Patient limited by pain    Behavior During Therapy  [facility] for tasks assessed/performed       Past Medical History:  Diagnosis Date  . Anxiety   . Arthritis   . Asthma   . Automatic implantable cardioverter-defibrillator in situ    2013, july  . Bell palsy    states has had 3 episodes  . Cardiomyopathy 08/2010   Presented with congestive heart failure; EF of 15% and 2012; hypotension on medication precludes optimal dosing  . CHF (congestive heart failure) ([place])   . Chronic systolic heart failure ([place])   . COPD (chronic obstructive pulmonary disease) ([place])    2013  . Dysrhythmia   . Gastroesophageal reflux disease   . Headache(784.0)   . Hot flashes 10/04/2016  . Hyperlipidemia   . Hypertension    09/2010-normal CMet and CBC; Lipid profile-116, 88, 25, 73  . Hypothyroidism    Recent TSH was normal.  . 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 Neuromuscular disorder (HCC)    Neuropathy, right foot and leg  . Neuropathy   . Obesity   . Pneumonia   . Seizures ([place])    last one at age 50  . Shortness of breath   . Stroke [facility])     stroke in 07/2012 and another in June, 2014  . Tobacco abuse    20 pack years    Past Surgical History:  Procedure  Laterality Date  . CESAREAN SECTION     X2  . COLONOSCOPY N/A 12/16/2013   Procedure: COLONOSCOPY;  Surgeon: Danie Binder, MD;  [facility];  Service: Endoscopy;  Laterality: N/A;  10:45-moved to 10/5 @ [place] notified pt  . EP IMPLANTABLE DEVICE     St. Jude  . EP IMPLANTABLE DEVICE N/A 08/13/2015   Procedure: Loop Recorder Insertion;  Surgeon: Evans Lance, MD;  Location: [facility] CV LAB;  Service: Cardiovascular;  Laterality: N/A;  . IMPLANTABLE CARDIOVERTER DEFIBRILLATOR IMPLANT N/A 10/10/2011   Procedure: IMPLANTABLE CARDIOVERTER DEFIBRILLATOR IMPLANT;  Surgeon: Evans Lance, MD;  Location: [facility] CATH LAB;  Service: Cardiovascular;  Laterality: N/A;  . MULTIPLE EXTRACTIONS WITH ALVEOLOPLASTY N/A 09/24/2012   Procedure: MULTIPLE EXTRACION #2, 4, 6, 7 ,8, 9, 11, 13, 18, 20, 21, 22, 23, 24, 25, 26, 27, 29 WITH ALVEOLOPLASTY, BIOPSY OF PALATE LESION, REMOVA RIGHT LINGUAL TORUS;  Surgeon: Gae Bon, DDS;  Location: [facility];  Service: Oral Surgery;  Laterality: N/A;  . TEE WITHOUT CARDIOVERSION N/A 07/11/2012   Procedure: TRANSESOPHAGEAL ECHOCARDIOGRAM (TEE);  Surgeon: Thayer Headings, MD;  Location: [facility];  Service:  Cardiovascular;  Laterality: N/A;  . TUBAL LIGATION      There were no vitals filed for this visit.  Subjective Assessment - 11/03/17 1126    Subjective  Patient reports she had trouble sleeping last night and that she is having to take her pain medication the alleviate her pain. She reprots she has not tried her HEP yet.     Pertinent History  OA, COPD, CHF, HTN, stroke, h/o seizures, refluc, back pain, medicine allergies, prior surgery, defibrillator implant.    Limitations  Sitting;Lifting;Standing;Walking;House hold activities    How long can you sit comfortably?  30 minutes    How long can you stand comfortably?  10 minutes    How long can you walk comfortably?  depends, sometimes can't walk upon standing because Rt leg too painful; sometimes  can walk a max of 15 minutes because of pain and fatigue.    Patient Stated Goals  to be able to do more for myself    Currently in Pain?  Yes    Pain Score  7     Pain Location  Buttocks    Pain Orientation  Right;Left;Upper;Posterior    Pain Descriptors / Indicators  Aching    Pain Type  Chronic pain    Pain Radiating Towards  radiates down Rt LE posterior, and into 4th/5th digits    Pain Onset  More than a month ago    Pain Frequency  Constant    Aggravating Factors   prolonged position    Pain Relieving Factors  medicine    Effect of Pain on Daily Activities  limited endurance and mobility        [facility] Adult PT Treatment/Exercise - 11/03/17 0001      Exercises   Exercises  Lumbar      Lumbar Exercises: Stretches   Lower Trunk Rotation  Limitations    Lower Trunk Rotation Limitations  10 reps bil; 5 second holds      Lumbar Exercises: Supine   Ab Set  15 reps;3 seconds    Glut Set  15 reps;3 seconds    Clam  15 reps    Clam Limitations  no theraband    Other Supine Lumbar Exercises  Lumbar Decompression Level 1: shoulder press, head press, leg lengthener, leg press; 1x 10 each, 3 second holds    Other Supine Lumbar Exercises  hamstring isometric into swiss exercise ball; 10 reps 5 second holds      Manual Therapy   Manual Therapy  Joint mobilization;Soft tissue mobilization    Manual therapy comments  completed seperated from other interventions    Joint Mobilization  PAIVM to L5/S1, 3x 30-45 seconds rocking for opeing on Rt side, patient in Lt sidelying, hook lying position    Soft tissue mobilization  light soft tissue mobilization along superior gluteus maximus and lumbar paraspinals between PAIVM mobilization        PT Education - 11/03/17 1140    Education Details  Educated on exercises throughotu including reviewed HEP. Educated on gentle stretching and importance of not moving into a position that increases pain.    Person(s) Educated  Patient    Methods   Explanation    Comprehension  Verbalized understanding       PT Short Term Goals - 11/02/17 1245      PT SHORT TERM GOAL #1   Title  Patient will be independent in HEP and perform as instructed regularly.    Time  4  Period  Weeks    Status  On-going      PT SHORT TERM GOAL #2   Title  Patient will increase gait velocity to 0.40 m/s to indicate decreased risk of falls and improve safety.    Baseline  initial - 0.25 m/s    Time  4    Period  Weeks    Status  On-going      PT SHORT TERM GOAL #3   Title  Patient will be able to perform 5xSTS in < 1:10 sec to indicate more fluid movement and decreased symptoms.     Baseline  initial - 1:32.25 sec    Time  4    Period  Weeks    Status  On-going      PT SHORT TERM GOAL #4   Title  Patient will report pain level at no greater than 6/10 to indicate reduction in symptoms and therefore improved functional ability.     Time  4    Period  Weeks    Status  On-going        PT Long Term Goals - 11/02/17 1245      PT LONG TERM GOAL #1   Title  Patient will be independent in advanced HEP and perform as instructed regularly.    Time  8    Period  Weeks    Status  On-going      PT LONG TERM GOAL #2   Title  Patient will increase gait velocity to 0.40 m/s to indicate decreased risk of falls and improve safety.    Time  8    Period  Weeks    Status  On-going      PT LONG TERM GOAL #3   Title  Patient will be able to perform 5xSTS in < 0:45 sec to indicate more fluid movement and decreased symptoms.     Time  8    Period  Weeks    Status  On-going      PT LONG TERM GOAL #4   Title  Patient will report pain level at no greater than 3/10 to indicate reduction in symptoms and therefore improved functional ability.     Time  8    Period  Weeks    Status  On-going         Plan - 11/03/17 1129    Clinical Impression Statement  Reviewed HEP this date and progressed core/LE strengthening and stabilization exercises. Initiated  Rt hamstring isometric to facilitate improve hamstring activation and reduce sciatic nerve pain. Initiated PAIVM (passive accessory intervertebral movement) for L5-S1 segments to facilitate opening of Rt intervertebral joint space. Patient reported good tolerance to intervention and denied increase in pain throughout session. She was educated on log roll technique to reduce strain placed on low back with daily bed mobility. She will continue to benefit from skilled PT interventions to address impairments and improve mobility while reducing pain.    Rehab Potential  Fair    Clinical Impairments Affecting Rehab Potential  positive - prior activity level walking 3x/wk; negative - severity of pain, instability walking, comorbidity    PT Frequency  2x / week    PT Duration  8 weeks    PT Treatment/Interventions  ADLs/Self Care Home Management;DME Instruction;Gait training;Functional mobility training;Therapeutic activities;Therapeutic exercise;Balance training;Neuromuscular re-education;Patient/family education;Manual techniques;Passive range of motion;Dry needling;Energy conservation    PT Next Visit Plan  Continue with supine core stabilization exercises. Continue with spinal mobilization for Rt L5-S1 opening and  perform STM as needed for gluteus and hamstring muscles along sciatic nerve tract. Consider adding sciatic nerve glide.    PT Home Exercise Plan  11/02/17 - glut set, ab set, clamshell supine;     Consulted and Agree with Plan of Care  Patient       Patient will benefit from skilled therapeutic intervention in order to improve the following deficits and impairments:  Abnormal gait, Decreased endurance  Visit Diagnosis: Sciatica, right side  Radiculopathy, lumbar region  Repeated falls  Unsteadiness on feet  Other abnormalities of gait and mobility     Problem List Patient Active Problem List   Diagnosis Date Noted  . Rectocele 10/04/2016  . Vaginal discharge 10/04/2016  .  Screening for colorectal cancer 10/04/2016  . Hot flashes 10/04/2016  . Vitamin D deficiency 02/26/2016  . Cryptogenic stroke ([place]) 08/13/2015  . Weakness of face muscles 07/25/2015  . Morbid obesity due to excess calories ([place]) 03/05/2015  . Rectal bleeding 11/05/2013  . Chronic systolic heart failure ([place]) 05/15/2013  . Asthma 04/29/2013  . Migraine variant 11/24/2012  . Weakness 11/15/2012  . Gingival disease 07/25/2012  . TIA (transient ischemic attack) 11/07/2011  . Automatic implantable cardioverter-defibrillator in situ 10/12/2011  . Hypokalemia 10/01/2011  . Tobacco abuse   . Hyperlipidemia   . Hypertension   . Gastroesophageal reflux disease   . Hypothyroidism   . Cardiomyopathy ([place]) 08/13/2010    Kipp Brood, PT, DPT Physical Therapist with [facility]  11/03/2017 12:12 PM    [place], [place], [address] Phone: [phone]   Fax:  [phone]  Name: Melanie Cordova MRN: [id number] Date of Birth: 02/10/1961

## 2017-11-07 ENCOUNTER — Ambulatory Visit (INDEPENDENT_AMBULATORY_CARE_PROVIDER_SITE_OTHER): Payer: Medicare Other | Admitting: *Deleted

## 2017-11-07 ENCOUNTER — Ambulatory Visit (HOSPITAL_COMMUNITY): Payer: Medicare Other

## 2017-11-07 ENCOUNTER — Encounter (HOSPITAL_COMMUNITY): Payer: Self-pay

## 2017-11-07 ENCOUNTER — Telehealth: Payer: Self-pay | Admitting: Cardiology

## 2017-11-07 DIAGNOSIS — M5431 Sciatica, right side: Secondary | ICD-10-CM | POA: Diagnosis not present

## 2017-11-07 DIAGNOSIS — I429 Cardiomyopathy, unspecified: Secondary | ICD-10-CM | POA: Diagnosis not present

## 2017-11-07 DIAGNOSIS — M5416 Radiculopathy, lumbar region: Secondary | ICD-10-CM

## 2017-11-07 DIAGNOSIS — R296 Repeated falls: Secondary | ICD-10-CM

## 2017-11-07 DIAGNOSIS — R2681 Unsteadiness on feet: Secondary | ICD-10-CM

## 2017-11-07 DIAGNOSIS — R2689 Other abnormalities of gait and mobility: Secondary | ICD-10-CM

## 2017-11-07 NOTE — Progress Notes (Signed)
Remote ICD transmission.   

## 2017-11-07 NOTE — Telephone Encounter (Signed)
Spoke with pt and reminded pt of remote transmission that is due today. Pt verbalized understanding.   

## 2017-11-07 NOTE — Therapy (Signed)
[place], [place], [address] Phone: [phone]   Fax:  [phone]  Physical Therapy Treatment  Patient Details  Name: Melanie Cordova MRN: [id number] Date of Birth: [birth year] Referring Provider: Ladene Artist, NP   Encounter Date: 11/07/2017  PT End of Session - 11/07/17 0857    Visit Number  4    Number of Visits  16    Date for PT Re-Evaluation  12/21/17   mini-re-assess 11/23/2017   Authorization Type  UHC Medicare no copay; Medicaid [place] A    Authorization - Visit Number  4    Authorization - Number of Visits  10    PT Start Time  [phone]    PT Stop Time  [phone]    PT Time Calculation (min)  40 min    Activity Tolerance  Patient limited by pain    Behavior During Therapy  [facility] for tasks assessed/performed       Past Medical History:  Diagnosis Date  . Anxiety   . Arthritis   . Asthma   . Automatic implantable cardioverter-defibrillator in situ    2013, july  . Bell palsy    states has had 3 episodes  . Cardiomyopathy 08/2010   Presented with congestive heart failure; EF of 15% and 2012; hypotension on medication precludes optimal dosing  . CHF (congestive heart failure) ([place])   . Chronic systolic heart failure ([place])   . COPD (chronic obstructive pulmonary disease) ([place])    2013  . Dysrhythmia   . Gastroesophageal reflux disease   . Headache(784.0)   . Hot flashes 10/04/2016  . Hyperlipidemia   . Hypertension    09/2010-normal CMet and CBC; Lipid profile-116, 88, 25, 73  . Hypothyroidism    Recent TSH was normal.  . 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 Neuromuscular disorder (HCC)    Neuropathy, right foot and leg  . Neuropathy   . Obesity   . Pneumonia   . Seizures ([place])    last one at age 9  . Shortness of breath   . Stroke [facility])     stroke in 07/2012 and another in June, 2014  . Tobacco abuse    20 pack years    Past Surgical History:  Procedure  Laterality Date  . CESAREAN SECTION     X2  . COLONOSCOPY N/A 12/16/2013   Procedure: COLONOSCOPY;  Surgeon: Danie Binder, MD;  [facility];  Service: Endoscopy;  Laterality: N/A;  10:45-moved to 10/5 @ [place] notified pt  . EP IMPLANTABLE DEVICE     St. Jude  . EP IMPLANTABLE DEVICE N/A 08/13/2015   Procedure: Loop Recorder Insertion;  Surgeon: Evans Lance, MD;  Location: [place] CV LAB;  Service: Cardiovascular;  Laterality: N/A;  . IMPLANTABLE CARDIOVERTER DEFIBRILLATOR IMPLANT N/A 10/10/2011   Procedure: IMPLANTABLE CARDIOVERTER DEFIBRILLATOR IMPLANT;  Surgeon: Evans Lance, MD;  Location: [facility] CATH LAB;  Service: Cardiovascular;  Laterality: N/A;  . MULTIPLE EXTRACTIONS WITH ALVEOLOPLASTY N/A 09/24/2012   Procedure: MULTIPLE EXTRACION #2, 4, 6, 7 ,8, 9, 11, 13, 18, 20, 21, 22, 23, 24, 25, 26, 27, 29 WITH ALVEOLOPLASTY, BIOPSY OF PALATE LESION, REMOVA RIGHT LINGUAL TORUS;  Surgeon: Gae Bon, DDS;  Location: [place] [facility];  Service: Oral Surgery;  Laterality: N/A;  . TEE WITHOUT CARDIOVERSION N/A 07/11/2012   Procedure: TRANSESOPHAGEAL ECHOCARDIOGRAM (TEE);  Surgeon: Thayer Headings, MD;  Location: [facility];  Service:  Cardiovascular;  Laterality: N/A;  . TUBAL LIGATION      There were no vitals filed for this visit.  Subjective Assessment - 11/07/17 0858    Subjective  Pt states that rainy days do not like her as it increases her pain. She states that if she stays warm she is good.     Pertinent History  OA, COPD, CHF, HTN, stroke, h/o seizures, refluc, back pain, medicine allergies, prior surgery, defibrillator implant.    Limitations  Sitting;Lifting;Standing;Walking;House hold activities    How long can you sit comfortably?  30 minutes    How long can you stand comfortably?  10 minutes    How long can you walk comfortably?  depends, sometimes can't walk upon standing because Rt leg too painful; sometimes can walk a max of 15 minutes because of pain and fatigue.     Patient Stated Goals  to be able to do more for myself    Currently in Pain?  Yes    Pain Score  7     Pain Location  Hip    Pain Orientation  Right;Left    Pain Descriptors / Indicators  Aching    Pain Type  Chronic pain    Pain Radiating Towards  radiates down to RLE posterior and anterior, and into 4th and 5th digits    Pain Onset  More than a month ago    Pain Frequency  Constant    Aggravating Factors   prolonged position    Pain Relieving Factors  medicine    Effect of Pain on Daily Activities  limited endurance and mobility             [facility] Adult PT Treatment/Exercise - 11/07/17 0001      Exercises   Exercises  Lumbar      Lumbar Exercises: Stretches   Active Hamstring Stretch  Right;Left;5 reps;10 seconds    Active Hamstring Stretch Limitations  hands behind knee for sciatic nerve glides    Lower Trunk Rotation Limitations  10x5" holds bil      Lumbar Exercises: Sidelying   Clam Limitations  R isometric clams with belt 15x10-15" holds      Manual Therapy   Manual Therapy  Myofascial release    Manual therapy comments  completed seperated from other interventions    Myofascial Release  R glute max, med, piriformis; palpation recreated her same RLE referred pain and centralized with prolonged MFR              PT Short Term Goals - 11/02/17 1245      PT SHORT TERM GOAL #1   Title  Patient will be independent in HEP and perform as instructed regularly.    Time  4    Period  Weeks    Status  On-going      PT SHORT TERM GOAL #2   Title  Patient will increase gait velocity to 0.40 m/s to indicate decreased risk of falls and improve safety.    Baseline  initial - 0.25 m/s    Time  4    Period  Weeks    Status  On-going      PT SHORT TERM GOAL #3   Title  Patient will be able to perform 5xSTS in < 1:10 sec to indicate more fluid movement and decreased symptoms.     Baseline  initial - 1:32.25 sec    Time  4    Period  Weeks    Status  On-going       PT SHORT TERM GOAL #4   Title  Patient will report pain level at no greater than 6/10 to indicate reduction in symptoms and therefore improved functional ability.     Time  4    Period  Weeks    Status  On-going        PT Long Term Goals - 11/02/17 1245      PT LONG TERM GOAL #1   Title  Patient will be independent in advanced HEP and perform as instructed regularly.    Time  8    Period  Weeks    Status  On-going      PT LONG TERM GOAL #2   Title  Patient will increase gait velocity to 0.40 m/s to indicate decreased risk of falls and improve safety.    Time  8    Period  Weeks    Status  On-going      PT LONG TERM GOAL #3   Title  Patient will be able to perform 5xSTS in < 0:45 sec to indicate more fluid movement and decreased symptoms.     Time  8    Period  Weeks    Status  On-going      PT LONG TERM GOAL #4   Title  Patient will report pain level at no greater than 3/10 to indicate reduction in symptoms and therefore improved functional ability.     Time  8    Period  Weeks    Status  On-going            Plan - 11/07/17 4098    Clinical Impression Statement  Pt reporting continued pain across upper hips/sacrum region; she states that she does not have LBP, it is only in this area and then down her anterior and posterior thigh, R>L. Pt unable to perform figure 4 stretch likely due to spasm/increased tightness. PT palpated pt's R glutes max, med, and piriformis and she reported that this recreated her R leg pain. With prolonged palpation, she reported that her R leg pain centralized to the point of palpation. Pt expressing that her pain has always been in this region and not in her spine; PT educated pt on anatomy and the potential interdependence between the lower back and hip and that it could be potentially both regions playing a role in her current complaints and she verbalized understanding. Performed isometric clams for pain relief of R hip. Pt with slightly  increased pain at EOS.     Rehab Potential  Fair    Clinical Impairments Affecting Rehab Potential  positive - prior activity level walking 3x/wk; negative - severity of pain, instability walking, comorbidity    PT Frequency  2x / week    PT Duration  8 weeks    PT Treatment/Interventions  ADLs/Self Care Home Management;DME Instruction;Gait training;Functional mobility training;Therapeutic activities;Therapeutic exercise;Balance training;Neuromuscular re-education;Patient/family education;Manual techniques;Passive range of motion;Dry needling;Energy conservation    PT Next Visit Plan  Continue with supine core stabilization exercises. Continue with spinal mobilization for Rt L5-S1 opening and perform STM as needed for gluteus and hamstring muscles along sciatic nerve tract.    PT Home Exercise Plan  11/02/17 - glut set, ab set, clamshell supine;     Consulted and Agree with Plan of Care  Patient       Patient will benefit from skilled therapeutic intervention in order to improve the following deficits and impairments:  Abnormal gait, Decreased endurance,  Decreased activity tolerance, Decreased balance, Decreased mobility, Decreased range of motion, Decreased strength, Difficulty walking, Hypomobility, Increased edema, Increased fascial restricitons, Increased muscle spasms, Impaired flexibility, Impaired sensation, Improper body mechanics, Postural dysfunction, Pain  Visit Diagnosis: Sciatica, right side  Radiculopathy, lumbar region  Repeated falls  Unsteadiness on feet  Other abnormalities of gait and mobility     Problem List Patient Active Problem List   Diagnosis Date Noted  . Rectocele 10/04/2016  . Vaginal discharge 10/04/2016  . Screening for colorectal cancer 10/04/2016  . Hot flashes 10/04/2016  . Vitamin D deficiency 02/26/2016  . Cryptogenic stroke ([place]) 08/13/2015  . Weakness of face muscles 07/25/2015  . Morbid obesity due to excess calories ([place]) 03/05/2015  .  Rectal bleeding 11/05/2013  . Chronic systolic heart failure ([place]) 05/15/2013  . Asthma 04/29/2013  . Migraine variant 11/24/2012  . Weakness 11/15/2012  . Gingival disease 07/25/2012  . TIA (transient ischemic attack) 11/07/2011  . Automatic implantable cardioverter-defibrillator in situ 10/12/2011  . Hypokalemia 10/01/2011  . Tobacco abuse   . Hyperlipidemia   . Hypertension   . Gastroesophageal reflux disease   . Hypothyroidism   . Cardiomyopathy ([place]) 08/13/2010       Geraldine Solar PT, [place] [address] [place], [place], [address] Phone: [phone]   Fax:  [phone]  Name: LAQUIDA COTRELL MRN: [id number] Date of Birth: 1960-04-10

## 2017-11-09 ENCOUNTER — Encounter: Payer: Self-pay | Admitting: Cardiology

## 2017-11-09 ENCOUNTER — Ambulatory Visit (HOSPITAL_COMMUNITY): Payer: Medicare Other | Admitting: Physical Therapy

## 2017-11-09 ENCOUNTER — Encounter (HOSPITAL_COMMUNITY): Payer: Self-pay | Admitting: Physical Therapy

## 2017-11-09 DIAGNOSIS — M5431 Sciatica, right side: Secondary | ICD-10-CM | POA: Diagnosis not present

## 2017-11-09 DIAGNOSIS — R2689 Other abnormalities of gait and mobility: Secondary | ICD-10-CM

## 2017-11-09 DIAGNOSIS — M5416 Radiculopathy, lumbar region: Secondary | ICD-10-CM

## 2017-11-09 DIAGNOSIS — R296 Repeated falls: Secondary | ICD-10-CM

## 2017-11-09 DIAGNOSIS — R2681 Unsteadiness on feet: Secondary | ICD-10-CM

## 2017-11-09 NOTE — Therapy (Signed)
[place], [place], [address] Phone: [phone]   Fax:  [phone]  Physical Therapy Treatment  Patient Details  Name: Melanie Cordova MRN: [id number] Date of Birth: [birth year] Referring Provider: Ladene Artist, NP   Encounter Date: 11/09/2017  PT End of Session - 11/09/17 0951    Visit Number  5    Number of Visits  16    Date for PT Re-Evaluation  12/21/17   mini-re-assess 11/23/2017   Authorization Type  UHC Medicare no copay; Medicaid [place] A    Authorization - Visit Number  5    Authorization - Number of Visits  10    PT Start Time  0945    PT Stop Time  1026    PT Time Calculation (min)  41 min    Activity Tolerance  Patient tolerated treatment well    Behavior During Therapy  [facility] for tasks assessed/performed       Past Medical History:  Diagnosis Date  . Anxiety   . Arthritis   . Asthma   . Automatic implantable cardioverter-defibrillator in situ    2013, july  . Bell palsy    states has had 3 episodes  . Cardiomyopathy 08/2010   Presented with congestive heart failure; EF of 15% and 2012; hypotension on medication precludes optimal dosing  . CHF (congestive heart failure) ([place])   . Chronic systolic heart failure ([place])   . COPD (chronic obstructive pulmonary disease) ([place])    2013  . Dysrhythmia   . Gastroesophageal reflux disease   . Headache(784.0)   . Hot flashes 10/04/2016  . Hyperlipidemia   . Hypertension    09/2010-normal CMet and CBC; Lipid profile-116, 88, 25, 73  . Hypothyroidism    Recent TSH was normal.  . 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 Neuromuscular disorder (HCC)    Neuropathy, right foot and leg  . Neuropathy   . Obesity   . Pneumonia   . Seizures ([place])    last one at age 77  . Shortness of breath   . Stroke [facility])     stroke in 07/2012 and another in June, 2014  . Tobacco abuse    20 pack years    Past Surgical History:   Procedure Laterality Date  . CESAREAN SECTION     X2  . COLONOSCOPY N/A 12/16/2013   Procedure: COLONOSCOPY;  Surgeon: Danie Binder, MD;  [facility];  Service: Endoscopy;  Laterality: N/A;  10:45-moved to 10/5 @ [place] notified pt  . EP IMPLANTABLE DEVICE     St. Jude  . EP IMPLANTABLE DEVICE N/A 08/13/2015   Procedure: Loop Recorder Insertion;  Surgeon: Evans Lance, MD;  Location: [place] CV LAB;  Service: Cardiovascular;  Laterality: N/A;  . IMPLANTABLE CARDIOVERTER DEFIBRILLATOR IMPLANT N/A 10/10/2011   Procedure: IMPLANTABLE CARDIOVERTER DEFIBRILLATOR IMPLANT;  Surgeon: Evans Lance, MD;  Location: [facility] CATH LAB;  Service: Cardiovascular;  Laterality: N/A;  . MULTIPLE EXTRACTIONS WITH ALVEOLOPLASTY N/A 09/24/2012   Procedure: MULTIPLE EXTRACION #2, 4, 6, 7 ,8, 9, 11, 13, 18, 20, 21, 22, 23, 24, 25, 26, 27, 29 WITH ALVEOLOPLASTY, BIOPSY OF PALATE LESION, REMOVA RIGHT LINGUAL TORUS;  Surgeon: Gae Bon, DDS;  Location: [place];  Service: Oral Surgery;  Laterality: N/A;  . TEE WITHOUT CARDIOVERSION N/A 07/11/2012   Procedure: TRANSESOPHAGEAL ECHOCARDIOGRAM (TEE);  Surgeon: Thayer Headings, MD;  Location: [place];  Service:  Cardiovascular;  Laterality: N/A;  . TUBAL LIGATION      There were no vitals filed for this visit.  Subjective Assessment - 11/09/17 0950    Subjective  Patient denied pain currently. She stated she has been doing her exercises at home.     Pertinent History  OA, COPD, CHF, HTN, stroke, h/o seizures, refluc, back pain, medicine allergies, prior surgery, defibrillator implant.    Limitations  Sitting;Lifting;Standing;Walking;House hold activities    How long can you sit comfortably?  30 minutes    How long can you stand comfortably?  10 minutes    How long can you walk comfortably?  depends, sometimes can't walk upon standing because Rt leg too painful; sometimes can walk a max of 15 minutes because of pain and fatigue.    Patient Stated  Goals  to be able to do more for myself    Currently in Pain?  No/denies                       [facility] Adult PT Treatment/Exercise - 11/09/17 0001      Lumbar Exercises: Stretches   Active Hamstring Stretch  Right;Left;5 reps;10 seconds    Active Hamstring Stretch Limitations  hands behind knee for sciatic nerve glides    Lower Trunk Rotation Limitations  10x5" holds bil      Lumbar Exercises: Standing   Heel Raises  10 reps    Heel Raises Limitations  and toe raises x 10 both legs    Functional Squats  10 reps    Functional Squats Limitations  Bilateral HHA    Other Standing Lumbar Exercises  Sit to stands from 22 inch mat x 10 without upper extremity assistance    Other Standing Lumbar Exercises  Standing hip abduction x10 each LE      Lumbar Exercises: Seated   Other Seated Lumbar Exercises  Marching x20 3'' alternating legs      Lumbar Exercises: Supine   Ab Set  15 reps;3 seconds    Glut Set  15 reps;3 seconds    Other Supine Lumbar Exercises  Supine bent knee raise x 20 alternating legs 3 second holds    Other Supine Lumbar Exercises  hamstring isometric into mat tablel; 10 reps 5 second holds each LE      Lumbar Exercises: Sidelying   Clam Limitations  isometric clams with belt 15x10-15" holds             PT Education - 11/09/17 0951    Education Details  Discussed purpose and technique of exercises throughout session.     Person(s) Educated  Patient    Methods  Explanation    Comprehension  Verbalized understanding;Verbal cues required       PT Short Term Goals - 11/02/17 1245      PT SHORT TERM GOAL #1   Title  Patient will be independent in HEP and perform as instructed regularly.    Time  4    Period  Weeks    Status  On-going      PT SHORT TERM GOAL #2   Title  Patient will increase gait velocity to 0.40 m/s to indicate decreased risk of falls and improve safety.    Baseline  initial - 0.25 m/s    Time  4    Period  Weeks    Status   On-going      PT SHORT TERM GOAL #3   Title  Patient will  be able to perform 5xSTS in < 1:10 sec to indicate more fluid movement and decreased symptoms.     Baseline  initial - 1:32.25 sec    Time  4    Period  Weeks    Status  On-going      PT SHORT TERM GOAL #4   Title  Patient will report pain level at no greater than 6/10 to indicate reduction in symptoms and therefore improved functional ability.     Time  4    Period  Weeks    Status  On-going        PT Long Term Goals - 11/02/17 1245      PT LONG TERM GOAL #1   Title  Patient will be independent in advanced HEP and perform as instructed regularly.    Time  8    Period  Weeks    Status  On-going      PT LONG TERM GOAL #2   Title  Patient will increase gait velocity to 0.40 m/s to indicate decreased risk of falls and improve safety.    Time  8    Period  Weeks    Status  On-going      PT LONG TERM GOAL #3   Title  Patient will be able to perform 5xSTS in < 0:45 sec to indicate more fluid movement and decreased symptoms.     Time  8    Period  Weeks    Status  On-going      PT LONG TERM GOAL #4   Title  Patient will report pain level at no greater than 3/10 to indicate reduction in symptoms and therefore improved functional ability.     Time  8    Period  Weeks    Status  On-going            Plan - 11/09/17 1029    Clinical Impression Statement  This session continued with established plan of care. This session added supine bent knee raises, seated marching, sit to stands, functional squats, standing hip extension and hip abduction. Discussed with patient possible DOMS. Patient denied any pain at end of session. Plan to continue with progression of functional strengthening in upcoming sessions.     Rehab Potential  Fair    Clinical Impairments Affecting Rehab Potential  positive - prior activity level walking 3x/wk; negative - severity of pain, instability walking, comorbidity    PT Frequency  2x / week     PT Duration  8 weeks    PT Treatment/Interventions  ADLs/Self Care Home Management;DME Instruction;Gait training;Functional mobility training;Therapeutic activities;Therapeutic exercise;Balance training;Neuromuscular re-education;Patient/family education;Manual techniques;Passive range of motion;Dry needling;Energy conservation    PT Next Visit Plan  Continue with supine core stabilization exercises. Continue with spinal mobilization for Rt L5-S1 opening and perform STM as needed for gluteus and hamstring muscles along sciatic nerve tract.    PT Home Exercise Plan  11/02/17 - glut set, ab set, clamshell supine;     Consulted and Agree with Plan of Care  Patient       Patient will benefit from skilled therapeutic intervention in order to improve the following deficits and impairments:  Abnormal gait, Decreased endurance, Decreased activity tolerance, Decreased balance, Decreased mobility, Decreased range of motion, Decreased strength, Difficulty walking, Hypomobility, Increased edema, Increased fascial restricitons, Increased muscle spasms, Impaired flexibility, Impaired sensation, Improper body mechanics, Postural dysfunction, Pain  Visit Diagnosis: Sciatica, right side  Radiculopathy, lumbar region  Repeated falls  Unsteadiness  on feet  Other abnormalities of gait and mobility     Problem List Patient Active Problem List   Diagnosis Date Noted  . Rectocele 10/04/2016  . Vaginal discharge 10/04/2016  . Screening for colorectal cancer 10/04/2016  . Hot flashes 10/04/2016  . Vitamin D deficiency 02/26/2016  . Cryptogenic stroke ([place]) 08/13/2015  . Weakness of face muscles 07/25/2015  . Morbid obesity due to excess calories ([place]) 03/05/2015  . Rectal bleeding 11/05/2013  . Chronic systolic heart failure ([place]) 05/15/2013  . Asthma 04/29/2013  . Migraine variant 11/24/2012  . Weakness 11/15/2012  . Gingival disease 07/25/2012  . TIA (transient ischemic attack) 11/07/2011  .  Automatic implantable cardioverter-defibrillator in situ 10/12/2011  . Hypokalemia 10/01/2011  . Tobacco abuse   . Hyperlipidemia   . Hypertension   . Gastroesophageal reflux disease   . Hypothyroidism   . Cardiomyopathy ([place]) 08/13/2010   Clarene Critchley PT, DPT 10:30 AM, 11/09/17 [place], [place], [address] Phone: [phone]   Fax:  [phone]  Name: SOLEDAD BUDREAU MRN: [id number] Date of Birth: 1960-05-28

## 2017-11-15 ENCOUNTER — Ambulatory Visit (HOSPITAL_COMMUNITY): Payer: Medicare Other | Attending: Neurology | Admitting: Physical Therapy

## 2017-11-15 ENCOUNTER — Encounter (HOSPITAL_COMMUNITY): Payer: Self-pay | Admitting: Physical Therapy

## 2017-11-15 DIAGNOSIS — R2689 Other abnormalities of gait and mobility: Secondary | ICD-10-CM | POA: Insufficient documentation

## 2017-11-15 DIAGNOSIS — R296 Repeated falls: Secondary | ICD-10-CM | POA: Diagnosis present

## 2017-11-15 DIAGNOSIS — R2681 Unsteadiness on feet: Secondary | ICD-10-CM | POA: Diagnosis present

## 2017-11-15 DIAGNOSIS — M5416 Radiculopathy, lumbar region: Secondary | ICD-10-CM | POA: Insufficient documentation

## 2017-11-15 DIAGNOSIS — M5431 Sciatica, right side: Secondary | ICD-10-CM | POA: Insufficient documentation

## 2017-11-15 NOTE — Therapy (Signed)
[place], [place], [address] Phone: [phone]   Fax:  [phone]  Physical Therapy Treatment  Patient Details  Name: Melanie Cordova MRN: [id number] Date of Birth: 03-09-1961 Referring Provider: Ladene Artist, NP   Encounter Date: 11/15/2017  PT End of Session - 11/15/17 1110    Visit Number  6    Number of Visits  16    Date for PT Re-Evaluation  12/21/17   mini-re-assess 11/23/2017   Authorization Type  UHC Medicare no copay; Medicaid [place] A    Authorization - Visit Number  6    Authorization - Number of Visits  10    PT Start Time  1106    PT Stop Time  1146    PT Time Calculation (min)  40 min    Activity Tolerance  Patient tolerated treatment well    Behavior During Therapy  [facility] for tasks assessed/performed       Past Medical History:  Diagnosis Date  . Anxiety   . Arthritis   . Asthma   . Automatic implantable cardioverter-defibrillator in situ    2013, july  . Bell palsy    states has had 3 episodes  . Cardiomyopathy 08/2010   Presented with congestive heart failure; EF of 15% and 2012; hypotension on medication precludes optimal dosing  . CHF (congestive heart failure) ([place])   . Chronic systolic heart failure ([place])   . COPD (chronic obstructive pulmonary disease) ([place])    2013  . Dysrhythmia   . Gastroesophageal reflux disease   . Headache(784.0)   . Hot flashes 10/04/2016  . Hyperlipidemia   . Hypertension    09/2010-normal CMet and CBC; Lipid profile-116, 88, 25, 73  . Hypothyroidism    Recent TSH was normal.  . 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 Neuromuscular disorder (HCC)    Neuropathy, right foot and leg  . Neuropathy   . Obesity   . Pneumonia   . Seizures ([place])    last one at age 57  . Shortness of breath   . Stroke [facility])     stroke in 07/2012 and another in June, 2014  . Tobacco abuse    20 pack years    Past Surgical History:   Procedure Laterality Date  . CESAREAN SECTION     X2  . COLONOSCOPY N/A 12/16/2013   Procedure: COLONOSCOPY;  Surgeon: Danie Binder, MD;  [facility];  Service: Endoscopy;  Laterality: N/A;  10:45-moved to 10/5 @ [place] notified pt  . EP IMPLANTABLE DEVICE     St. Jude  . EP IMPLANTABLE DEVICE N/A 08/13/2015   Procedure: Loop Recorder Insertion;  Surgeon: Evans Lance, MD;  Location: [place] CV LAB;  Service: Cardiovascular;  Laterality: N/A;  . IMPLANTABLE CARDIOVERTER DEFIBRILLATOR IMPLANT N/A 10/10/2011   Procedure: IMPLANTABLE CARDIOVERTER DEFIBRILLATOR IMPLANT;  Surgeon: Evans Lance, MD;  Location: [facility] CATH LAB;  Service: Cardiovascular;  Laterality: N/A;  . MULTIPLE EXTRACTIONS WITH ALVEOLOPLASTY N/A 09/24/2012   Procedure: MULTIPLE EXTRACION #2, 4, 6, 7 ,8, 9, 11, 13, 18, 20, 21, 22, 23, 24, 25, 26, 27, 29 WITH ALVEOLOPLASTY, BIOPSY OF PALATE LESION, REMOVA RIGHT LINGUAL TORUS;  Surgeon: Gae Bon, DDS;  Location: [place];  Service: Oral Surgery;  Laterality: N/A;  . TEE WITHOUT CARDIOVERSION N/A 07/11/2012   Procedure: TRANSESOPHAGEAL ECHOCARDIOGRAM (TEE);  Surgeon: Thayer Headings, MD;  Location: [place];  Service:  Cardiovascular;  Laterality: N/A;  . TUBAL LIGATION      There were no vitals filed for this visit.  Subjective Assessment - 11/15/17 1108    Subjective  Patient stated she has been doing her exercises. Stated she was sore for 1 day after last session.     Pertinent History  OA, COPD, CHF, HTN, stroke, h/o seizures, refluc, back pain, medicine allergies, prior surgery, defibrillator implant.    Limitations  Sitting;Lifting;Standing;Walking;House hold activities    How long can you sit comfortably?  30 minutes    How long can you stand comfortably?  10 minutes    How long can you walk comfortably?  depends, sometimes can't walk upon standing because Rt leg too painful; sometimes can walk a max of 15 minutes because of pain and fatigue.     Patient Stated Goals  to be able to do more for myself    Currently in Pain?  Yes    Pain Score  5     Pain Location  Hip    Pain Orientation  Right;Left    Pain Descriptors / Indicators  Aching    Pain Type  Chronic pain                       [facility] Adult PT Treatment/Exercise - 11/15/17 0001      Lumbar Exercises: Stretches   Active Hamstring Stretch  Right;Left;5 reps;10 seconds    Active Hamstring Stretch Limitations  hands behind knee for sciatic nerve glides    Lower Trunk Rotation Limitations  10x5" holds bil      Lumbar Exercises: Standing   Heel Raises  10 reps    Heel Raises Limitations  and toe raises x 10 both legs    Functional Squats  10 reps    Functional Squats Limitations  Bilateral HHA    Other Standing Lumbar Exercises  Sit to stands from 21 inch mat x 10 without upper extremity assistance    Other Standing Lumbar Exercises  Standing hip abduction and hip extension x10 each LE      Lumbar Exercises: Seated   Other Seated Lumbar Exercises  Marching x20 3'' alternating legs 1-2 inch lift. Seated on [employer].       Lumbar Exercises: Supine   Ab Set  15 reps;3 seconds    Glut Set  15 reps;3 seconds    Other Supine Lumbar Exercises  hamstring isometric into mat table; 10 reps 5 second holds each LE      Lumbar Exercises: Sidelying   Clam Limitations  isometric clams with belt 15x10-15" holds               PT Short Term Goals - 11/02/17 1245      PT SHORT TERM GOAL #1   Title  Patient will be independent in HEP and perform as instructed regularly.    Time  4    Period  Weeks    Status  On-going      PT SHORT TERM GOAL #2   Title  Patient will increase gait velocity to 0.40 m/s to indicate decreased risk of falls and improve safety.    Baseline  initial - 0.25 m/s    Time  4    Period  Weeks    Status  On-going      PT SHORT TERM GOAL #3   Title  Patient will be able to perform 5xSTS in < 1:10 sec to indicate more fluid  movement and  decreased symptoms.     Baseline  initial - 1:32.25 sec    Time  4    Period  Weeks    Status  On-going      PT SHORT TERM GOAL #4   Title  Patient will report pain level at no greater than 6/10 to indicate reduction in symptoms and therefore improved functional ability.     Time  4    Period  Weeks    Status  On-going        PT Long Term Goals - 11/02/17 1245      PT LONG TERM GOAL #1   Title  Patient will be independent in advanced HEP and perform as instructed regularly.    Time  8    Period  Weeks    Status  On-going      PT LONG TERM GOAL #2   Title  Patient will increase gait velocity to 0.40 m/s to indicate decreased risk of falls and improve safety.    Time  8    Period  Weeks    Status  On-going      PT LONG TERM GOAL #3   Title  Patient will be able to perform 5xSTS in < 0:45 sec to indicate more fluid movement and decreased symptoms.     Time  8    Period  Weeks    Status  On-going      PT LONG TERM GOAL #4   Title  Patient will report pain level at no greater than 3/10 to indicate reduction in symptoms and therefore improved functional ability.     Time  8    Period  Weeks    Status  On-going            Plan - 11/15/17 1140    Clinical Impression Statement  This session continued to focus on lumbar stabilization exercises and functional lower extremity strengthening. This session progressed patient with sit to stands from mat table set to 21 inches. This session This session progressed patient to performing seated marching with abdominal activation while sitting on Airex this session. Also added standing hip extension strengthening this session. Patient had reported that she was only sore until the evening after last session, and therefore continued with progressions this session. Plan to continue with a focus on functional strengthening and lumbar stabilization.     Rehab Potential  Fair    Clinical Impairments Affecting Rehab Potential  positive -  prior activity level walking 3x/wk; negative - severity of pain, instability walking, comorbidity    PT Frequency  2x / week    PT Duration  8 weeks    PT Treatment/Interventions  ADLs/Self Care Home Management;DME Instruction;Gait training;Functional mobility training;Therapeutic activities;Therapeutic exercise;Balance training;Neuromuscular re-education;Patient/family education;Manual techniques;Passive range of motion;Dry needling;Energy conservation    PT Next Visit Plan  Continue with supine core stabilization exercises and functional lower extremity strengthening. Consider continuing with spinal mobilization for Rt L5-S1 opening and perform STM as needed for gluteus and hamstring muscles along sciatic nerve tract.    PT Home Exercise Plan  11/02/17 - glut set, ab set, clamshell supine;     Consulted and Agree with Plan of Care  Patient       Patient will benefit from skilled therapeutic intervention in order to improve the following deficits and impairments:  Abnormal gait, Decreased endurance, Decreased activity tolerance, Decreased balance, Decreased mobility, Decreased range of motion, Decreased strength, Difficulty walking, Hypomobility, Increased edema,  Increased fascial restricitons, Increased muscle spasms, Impaired flexibility, Impaired sensation, Improper body mechanics, Postural dysfunction, Pain  Visit Diagnosis: Sciatica, right side  Radiculopathy, lumbar region  Repeated falls  Unsteadiness on feet  Other abnormalities of gait and mobility     Problem List Patient Active Problem List   Diagnosis Date Noted  . Rectocele 10/04/2016  . Vaginal discharge 10/04/2016  . Screening for colorectal cancer 10/04/2016  . Hot flashes 10/04/2016  . Vitamin D deficiency 02/26/2016  . Cryptogenic stroke ([place]) 08/13/2015  . Weakness of face muscles 07/25/2015  . Morbid obesity due to excess calories ([place]) 03/05/2015  . Rectal bleeding 11/05/2013  . Chronic systolic heart  failure ([place]) 05/15/2013  . Asthma 04/29/2013  . Migraine variant 11/24/2012  . Weakness 11/15/2012  . Gingival disease 07/25/2012  . TIA (transient ischemic attack) 11/07/2011  . Automatic implantable cardioverter-defibrillator in situ 10/12/2011  . Hypokalemia 10/01/2011  . Tobacco abuse   . Hyperlipidemia   . Hypertension   . Gastroesophageal reflux disease   . Hypothyroidism   . Cardiomyopathy ([place]) 08/13/2010   Clarene Critchley PT, DPT 12:02 PM, 11/15/17 [place], [place], [address] Phone: [phone]   Fax:  [phone]  Name: ATHENE SCHUHMACHER MRN: [id number] Date of Birth: 1960-11-01

## 2017-11-17 ENCOUNTER — Encounter (HOSPITAL_COMMUNITY): Payer: Self-pay

## 2017-11-17 ENCOUNTER — Ambulatory Visit (HOSPITAL_COMMUNITY): Payer: Medicare Other

## 2017-11-17 DIAGNOSIS — M5431 Sciatica, right side: Secondary | ICD-10-CM | POA: Diagnosis not present

## 2017-11-17 DIAGNOSIS — R2689 Other abnormalities of gait and mobility: Secondary | ICD-10-CM

## 2017-11-17 DIAGNOSIS — R296 Repeated falls: Secondary | ICD-10-CM

## 2017-11-17 DIAGNOSIS — M5416 Radiculopathy, lumbar region: Secondary | ICD-10-CM

## 2017-11-17 DIAGNOSIS — R2681 Unsteadiness on feet: Secondary | ICD-10-CM

## 2017-11-17 NOTE — Therapy (Signed)
[place], [place], [address] Phone: [phone]   Fax:  [phone]  Physical Therapy Treatment  Patient Details  Name: Melanie Cordova MRN: [id number] Date of Birth: 1960-07-01 Referring Provider: Ladene Artist, NP   Encounter Date: 11/17/2017  PT End of Session - 11/17/17 0905    Visit Number  7    Number of Visits  16    Date for PT Re-Evaluation  12/21/17   mini-re-assess 11/23/2017   Authorization Type  UHC Medicare no copay; Medicaid [place] A    Authorization - Visit Number  7    Authorization - Number of Visits  10    PT Start Time  0900    PT Stop Time  0940    PT Time Calculation (min)  40 min    Activity Tolerance  Patient tolerated treatment well    Behavior During Therapy  [facility] for tasks assessed/performed       Past Medical History:  Diagnosis Date  . Anxiety   . Arthritis   . Asthma   . Automatic implantable cardioverter-defibrillator in situ    2013, july  . Bell palsy    states has had 3 episodes  . Cardiomyopathy 08/2010   Presented with congestive heart failure; EF of 15% and 2012; hypotension on medication precludes optimal dosing  . CHF (congestive heart failure) ([place])   . Chronic systolic heart failure ([place])   . COPD (chronic obstructive pulmonary disease) ([place])    2013  . Dysrhythmia   . Gastroesophageal reflux disease   . Headache(784.0)   . Hot flashes 10/04/2016  . Hyperlipidemia   . Hypertension    09/2010-normal CMet and CBC; Lipid profile-116, 88, 25, 73  . Hypothyroidism    Recent TSH was normal.  . 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 Neuromuscular disorder (HCC)    Neuropathy, right foot and leg  . Neuropathy   . Obesity   . Pneumonia   . Seizures ([place])    last one at age 37  . Shortness of breath   . Stroke [facility])     stroke in 07/2012 and another in June, 2014  . Tobacco abuse    20 pack years    Past Surgical History:   Procedure Laterality Date  . CESAREAN SECTION     X2  . COLONOSCOPY N/A 12/16/2013   Procedure: COLONOSCOPY;  Surgeon: Danie Binder, MD;  [facility];  Service: Endoscopy;  Laterality: N/A;  10:45-moved to 10/5 @ [place] notified pt  . EP IMPLANTABLE DEVICE     St. Jude  . EP IMPLANTABLE DEVICE N/A 08/13/2015   Procedure: Loop Recorder Insertion;  Surgeon: Evans Lance, MD;  Location: [place] CV LAB;  Service: Cardiovascular;  Laterality: N/A;  . IMPLANTABLE CARDIOVERTER DEFIBRILLATOR IMPLANT N/A 10/10/2011   Procedure: IMPLANTABLE CARDIOVERTER DEFIBRILLATOR IMPLANT;  Surgeon: Evans Lance, MD;  Location: [facility] CATH LAB;  Service: Cardiovascular;  Laterality: N/A;  . MULTIPLE EXTRACTIONS WITH ALVEOLOPLASTY N/A 09/24/2012   Procedure: MULTIPLE EXTRACION #2, 4, 6, 7 ,8, 9, 11, 13, 18, 20, 21, 22, 23, 24, 25, 26, 27, 29 WITH ALVEOLOPLASTY, BIOPSY OF PALATE LESION, REMOVA RIGHT LINGUAL TORUS;  Surgeon: Gae Bon, DDS;  Location: [place];  Service: Oral Surgery;  Laterality: N/A;  . TEE WITHOUT CARDIOVERSION N/A 07/11/2012   Procedure: TRANSESOPHAGEAL ECHOCARDIOGRAM (TEE);  Surgeon: Thayer Headings, MD;  Location: [place];  Service:  Cardiovascular;  Laterality: N/A;  . TUBAL LIGATION      There were no vitals filed for this visit.  Subjective Assessment - 11/17/17 0906    Subjective  Pt reports that she's doing fine today. Her hips and knees are sore from the mini squats she had to do last session. No real pain, just muscle soreness today.    Pertinent History  OA, COPD, CHF, HTN, stroke, h/o seizures, refluc, back pain, medicine allergies, prior surgery, defibrillator implant.    Limitations  Sitting;Lifting;Standing;Walking;House hold activities    How long can you sit comfortably?  30 minutes    How long can you stand comfortably?  10 minutes    How long can you walk comfortably?  depends, sometimes can't walk upon standing because Rt leg too painful; sometimes  can walk a max of 15 minutes because of pain and fatigue.    Patient Stated Goals  to be able to do more for myself    Currently in Pain?  No/denies             [facility] Adult PT Treatment/Exercise - 11/17/17 0001      Exercises   Exercises  Lumbar      Lumbar Exercises: Stretches   Active Hamstring Stretch  Right;Left;5 reps;10 seconds    Active Hamstring Stretch Limitations  towel behind knee for sciatic nerve glide    Lower Trunk Rotation Limitations  10x5" holds bil      Lumbar Exercises: Standing   Heel Raises  15 reps    Heel Raises Limitations  heel and toe    Functional Squats  10 reps    Functional Squats Limitations  Bilateral HHA, cues for form    Shoulder Extension Limitations  bil tandem stance firm +UE flexion with 3# weightbar x10 reps each    Shoulder Adduction Limitations  sidestepping in // bars RTB x3RT    Other Standing Lumbar Exercises  bil Standing hip abduction and hip diagonals with RTB x10 each      Lumbar Exercises: Supine   Bent Knee Raise  10 reps;3 seconds    Bent Knee Raise Limitations  +ab set    Bridge  10 reps    Bridge Limitations  2 sets, +ankle DF to increase posterior chain engagement          PT Education - 11/17/17 0909    Education Details  updated HEP, exercise technique, muscle soreness is normal after strengthening    Person(s) Educated  Patient    Methods  Explanation;Demonstration;Handout    Comprehension  Verbalized understanding;Returned demonstration       PT Short Term Goals - 11/02/17 1245      PT SHORT TERM GOAL #1   Title  Patient will be independent in HEP and perform as instructed regularly.    Time  4    Period  Weeks    Status  On-going      PT SHORT TERM GOAL #2   Title  Patient will increase gait velocity to 0.40 m/s to indicate decreased risk of falls and improve safety.    Baseline  initial - 0.25 m/s    Time  4    Period  Weeks    Status  On-going      PT SHORT TERM GOAL #3   Title  Patient will  be able to perform 5xSTS in < 1:10 sec to indicate more fluid movement and decreased symptoms.     Baseline  initial - 1:32.25  sec    Time  4    Period  Weeks    Status  On-going      PT SHORT TERM GOAL #4   Title  Patient will report pain level at no greater than 6/10 to indicate reduction in symptoms and therefore improved functional ability.     Time  4    Period  Weeks    Status  On-going        PT Long Term Goals - 11/02/17 1245      PT LONG TERM GOAL #1   Title  Patient will be independent in advanced HEP and perform as instructed regularly.    Time  8    Period  Weeks    Status  On-going      PT LONG TERM GOAL #2   Title  Patient will increase gait velocity to 0.40 m/s to indicate decreased risk of falls and improve safety.    Time  8    Period  Weeks    Status  On-going      PT LONG TERM GOAL #3   Title  Patient will be able to perform 5xSTS in < 0:45 sec to indicate more fluid movement and decreased symptoms.     Time  8    Period  Weeks    Status  On-going      PT LONG TERM GOAL #4   Title  Patient will report pain level at no greater than 3/10 to indicate reduction in symptoms and therefore improved functional ability.     Time  8    Period  Weeks    Status  On-going            Plan - 11/17/17 0940    Clinical Impression Statement  Continued with established POC focusing on improving core and functional strength. Updated her HEP this date to include LTRs and supine HS stretch/sciatic nerve glides. Able to progress pt to bent knee raises in supine for increased core strengthening. Continued with standing functional strengthening and pt tolerating well, only having knee pain with squats and overall just verbalizing muscle fatigue. Able to add theraband to hip strengthening this date as well as balance work for core strengthening. No reports of increased pain at EOS, just fatigue. Continue as planned, progressing as able.     Rehab Potential  Fair     Clinical Impairments Affecting Rehab Potential  positive - prior activity level walking 3x/wk; negative - severity of pain, instability walking, comorbidity    PT Frequency  2x / week    PT Duration  8 weeks    PT Treatment/Interventions  ADLs/Self Care Home Management;DME Instruction;Gait training;Functional mobility training;Therapeutic activities;Therapeutic exercise;Balance training;Neuromuscular re-education;Patient/family education;Manual techniques;Passive range of motion;Dry needling;Energy conservation    PT Next Visit Plan  continue to prgress standing strength and core work as tolerated; Continue with supine core stabilization exercises and functional lower extremity strengthening. Consider continuing with spinal mobilization for Rt L5-S1 opening and perform STM as needed for gluteus and hamstring muscles along sciatic nerve tract.    PT Home Exercise Plan  11/02/17 - glut set, ab set, clamshell supine; 9/6: LTRs, supine hamstring stretch    Consulted and Agree with Plan of Care  Patient       Patient will benefit from skilled therapeutic intervention in order to improve the following deficits and impairments:  Abnormal gait, Decreased endurance, Decreased activity tolerance, Decreased balance, Decreased mobility, Decreased range of motion, Decreased strength, Difficulty walking,  Hypomobility, Increased edema, Increased fascial restricitons, Increased muscle spasms, Impaired flexibility, Impaired sensation, Improper body mechanics, Postural dysfunction, Pain  Visit Diagnosis: Sciatica, right side  Radiculopathy, lumbar region  Repeated falls  Unsteadiness on feet  Other abnormalities of gait and mobility     Problem List Patient Active Problem List   Diagnosis Date Noted  . Rectocele 10/04/2016  . Vaginal discharge 10/04/2016  . Screening for colorectal cancer 10/04/2016  . Hot flashes 10/04/2016  . Vitamin D deficiency 02/26/2016  . Cryptogenic stroke ([place]) 08/13/2015  .  Weakness of face muscles 07/25/2015  . Morbid obesity due to excess calories ([place]) 03/05/2015  . Rectal bleeding 11/05/2013  . Chronic systolic heart failure ([place]) 05/15/2013  . Asthma 04/29/2013  . Migraine variant 11/24/2012  . Weakness 11/15/2012  . Gingival disease 07/25/2012  . TIA (transient ischemic attack) 11/07/2011  . Automatic implantable cardioverter-defibrillator in situ 10/12/2011  . Hypokalemia 10/01/2011  . Tobacco abuse   . Hyperlipidemia   . Hypertension   . Gastroesophageal reflux disease   . Hypothyroidism   . Cardiomyopathy ([place]) 08/13/2010       Geraldine Solar PT, [place] [address] [place], [place], [address] Phone: [phone]   Fax:  [phone]  Name: Melanie Cordova MRN: [id number] Date of Birth: 1961-02-11

## 2017-11-17 NOTE — Patient Instructions (Signed)
Access Code: [email or web address]  URL: [email or web address]  Date: 11/17/2017  Prepared by: Geraldine Solar   Exercises Supine Hamstring Stretch - 5-10 reps - 10-15seconds hold - 1x daily - 7x weekly Supine Lower Trunk Rotation - 10-15 reps - 10-15seconds hold - 1x daily - 7x weekly

## 2017-11-20 ENCOUNTER — Encounter (HOSPITAL_COMMUNITY): Payer: Self-pay

## 2017-11-20 ENCOUNTER — Ambulatory Visit (HOSPITAL_COMMUNITY): Payer: Medicare Other

## 2017-11-20 DIAGNOSIS — R2681 Unsteadiness on feet: Secondary | ICD-10-CM

## 2017-11-20 DIAGNOSIS — R296 Repeated falls: Secondary | ICD-10-CM

## 2017-11-20 DIAGNOSIS — R2689 Other abnormalities of gait and mobility: Secondary | ICD-10-CM

## 2017-11-20 DIAGNOSIS — M5416 Radiculopathy, lumbar region: Secondary | ICD-10-CM

## 2017-11-20 DIAGNOSIS — M5431 Sciatica, right side: Secondary | ICD-10-CM

## 2017-11-20 NOTE — Therapy (Signed)
[place], [place], [address] Phone: [phone]   Fax:  [phone]  Physical Therapy Treatment  Patient Details  Name: Melanie Cordova MRN: [id number] Date of Birth: 08-27-1960 Referring Provider: Ladene Artist, NP   Encounter Date: 11/20/2017  PT End of Session - 11/20/17 0835    Visit Number  8    Number of Visits  16    Date for PT Re-Evaluation  12/21/17   mini-re-assess 11/23/2017   Authorization Type  UHC Medicare no copay; Medicaid [place]    Authorization - Visit Number  8    Authorization - Number of Visits  10    PT Start Time  0900    PT Stop Time  0944    PT Time Calculation (min)  44 min    Activity Tolerance  Patient tolerated treatment well    Behavior During Therapy  [facility] for tasks assessed/performed       Past Medical History:  Diagnosis Date  . Anxiety   . Arthritis   . Asthma   . Automatic implantable cardioverter-defibrillator in situ    2013, july  . Bell palsy    states has had 3 episodes  . Cardiomyopathy 08/2010   Presented with congestive heart failure; EF of 15% and 2012; hypotension on medication precludes optimal dosing  . CHF (congestive heart failure) ([place])   . Chronic systolic heart failure ([place])   . COPD (chronic obstructive pulmonary disease) ([place])    2013  . Dysrhythmia   . Gastroesophageal reflux disease   . Headache(784.0)   . Hot flashes 10/04/2016  . Hyperlipidemia   . Hypertension    09/2010-normal CMet and CBC; Lipid profile-116, 88, 25, 73  . Hypothyroidism    Recent TSH was normal.  . 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 Neuromuscular disorder (HCC)    Neuropathy, right foot and leg  . Neuropathy   . Obesity   . Pneumonia   . Seizures ([place])    last one at age 14  . Shortness of breath   . Stroke [facility])     stroke in 07/2012 and another in June, 2014  . Tobacco abuse    20 pack years    Past Surgical History:   Procedure Laterality Date  . CESAREAN SECTION     X2  . COLONOSCOPY N/A 12/16/2013   Procedure: COLONOSCOPY;  Surgeon: Danie Binder, MD;  [facility];  Service: Endoscopy;  Laterality: N/A;  10:45-moved to 10/5 @ [place] notified pt  . EP IMPLANTABLE DEVICE     St. Jude  . EP IMPLANTABLE DEVICE N/A 08/13/2015   Procedure: Loop Recorder Insertion;  Surgeon: Evans Lance, MD;  Location: [place] CV LAB;  Service: Cardiovascular;  Laterality: N/A;  . IMPLANTABLE CARDIOVERTER DEFIBRILLATOR IMPLANT N/A 10/10/2011   Procedure: IMPLANTABLE CARDIOVERTER DEFIBRILLATOR IMPLANT;  Surgeon: Evans Lance, MD;  Location: [facility] CATH LAB;  Service: Cardiovascular;  Laterality: N/A;  . MULTIPLE EXTRACTIONS WITH ALVEOLOPLASTY N/A 09/24/2012   Procedure: MULTIPLE EXTRACION #2, 4, 6, 7 ,8, 9, 11, 13, 18, 20, 21, 22, 23, 24, 25, 26, 27, 29 WITH ALVEOLOPLASTY, BIOPSY OF PALATE LESION, REMOVA RIGHT LINGUAL TORUS;  Surgeon: Gae Bon, DDS;  Location: [place];  Service: Oral Surgery;  Laterality: N/A;  . TEE WITHOUT CARDIOVERSION N/A 07/11/2012   Procedure: TRANSESOPHAGEAL ECHOCARDIOGRAM (TEE);  Surgeon: Thayer Headings, MD;  Location: [place];  Service:  Cardiovascular;  Laterality: N/A;  . TUBAL LIGATION      There were no vitals filed for this visit.  Subjective Assessment - 11/20/17 0834    Subjective  Reports pain all across her hips, Rt > Lt. More like pain than muscle soreness. Rt leg not going numb as oftern recently; resting better at night. Was waking 3-4/night due to pain; now waking 1 x/night to go to the restroom.    Pertinent History  OA, COPD, CHF, HTN, stroke, h/o seizures, refluc, back pain, medicine allergies, prior surgery, defibrillator implant.    Limitations  Sitting;Lifting;Standing;Walking;House hold activities    How long can you sit comfortably?  30 minutes    How long can you stand comfortably?  10 minutes    How long can you walk comfortably?  depends, sometimes  can't walk upon standing because Rt leg too painful; sometimes can walk a max of 15 minutes because of pain and fatigue.    Patient Stated Goals  to be able to do more for myself                       [facility] Adult PT Treatment/Exercise - 11/20/17 0001      Lumbar Exercises: Stretches   Active Hamstring Stretch  Right;Left;5 reps;10 seconds    Active Hamstring Stretch Limitations  towel behind knee for sciatic nerve glide    Lower Trunk Rotation Limitations  10x5" holds bil      Lumbar Exercises: Standing   Heel Raises  15 reps    Heel Raises Limitations  heel and toe    Functional Squats  10 reps    Functional Squats Limitations  from chair with armrest; hands on knees    Shoulder Extension Limitations  bil tandem stance with UE flex 3# bar x 10 reps    Shoulder Adduction Limitations  sidestepping in // bars RTB x3 RT    Other Standing Lumbar Exercises  bil Standing hip abduction and hip diagonals with RTB x10 each      Lumbar Exercises: Supine   Bent Knee Raise  10 reps;3 seconds    Bent Knee Raise Limitations  +ab set    Bridge  10 reps    Bridge Limitations  2 sets, +ankle DF to increase posterior chain engagement      Lumbar Exercises: Sidelying   Clam Limitations  clams x10 reps      Manual Therapy   Manual Therapy  Joint mobilization;Soft tissue mobilization    Manual therapy comments  completed seperated from other interventions    Joint Mobilization  PAIVM to L5/S1, 3x 30-45 seconds rocking for opeing on Rt side, patient in Lt sidelying, hook lying position    Soft tissue mobilization  light soft tissue mobilization along superior gluteus maximus and lumbar paraspinals between PAIVM mobilization             PT Education - 11/20/17 0938    Education Details  reviewed HEP, exercise technique and reasoning, differentiation between muscle soreness and pain.     Person(s) Educated  Patient    Methods  Explanation    Comprehension  Verbalized  understanding       PT Short Term Goals - 11/20/17 0839      PT SHORT TERM GOAL #1   Title  Patient will be independent in HEP and perform as instructed regularly.    Time  4    Period  Weeks    Status  Achieved  PT SHORT TERM GOAL #2   Title  Patient will increase gait velocity to 0.40 m/s to indicate decreased risk of falls and improve safety.    Baseline  initial - 0.25 m/s    Time  4    Period  Weeks    Status  On-going      PT SHORT TERM GOAL #3   Title  Patient will be able to perform 5xSTS in < 1:10 sec to indicate more fluid movement and decreased symptoms.     Baseline  initial - 1:32.25 sec    Time  4    Period  Weeks    Status  Partially Met      PT SHORT TERM GOAL #4   Title  Patient will report pain level at no greater than 6/10 to indicate reduction in symptoms and therefore improved functional ability.     Time  4    Period  Weeks    Status  Partially Met        PT Long Term Goals - 11/20/17 6712      PT LONG TERM GOAL #1   Title  Patient will be independent in advanced HEP and perform as instructed regularly.    Time  8    Period  Weeks    Status  On-going      PT LONG TERM GOAL #2   Title  Patient will increase gait velocity to 0.60 m/s to indicate decreased risk of falls and improve safety.    Time  8    Period  Weeks    Status  On-going      PT LONG TERM GOAL #3   Title  Patient will be able to perform 5xSTS in < 0:45 sec to indicate more fluid movement and decreased symptoms.     Time  8    Period  Weeks    Status  On-going      PT LONG TERM GOAL #4   Title  Patient will report pain level at no greater than 3/10 to indicate reduction in symptoms and therefore improved functional ability.     Time  8    Period  Weeks    Status  On-going            Plan - 11/20/17 4580    Clinical Impression Statement  Continued with established POC focusing on improving core and functional strength. Reviewed updated HEP. Patient able to  complete all exercises without c/o pain increase. Performed functional squats at sit-to-stand from a chair. EOS patient said pain increased greater than beginning of session but described it as soreness not sharp shooting pain. Continue as planned, progressing as able.     Rehab Potential  Fair    Clinical Impairments Affecting Rehab Potential  positive - prior activity level walking 3x/wk; negative - severity of pain, instability walking, comorbidity    PT Frequency  2x / week    PT Duration  8 weeks    PT Treatment/Interventions  ADLs/Self Care Home Management;DME Instruction;Gait training;Functional mobility training;Therapeutic activities;Therapeutic exercise;Balance training;Neuromuscular re-education;Patient/family education;Manual techniques;Passive range of motion;Dry needling;Energy conservation    PT Next Visit Plan  continue to prgress standing strength and core work as tolerated; Continue with supine core stabilization exercises and functional lower extremity strengthening. Consider continuing with spinal mobilization for Rt L5-S1 opening and perform STM as needed for gluteus and hamstring muscles along sciatic nerve tract.    PT Home Exercise Plan  11/02/17 - glut set, ab  set, clamshell supine; 9/6: LTRs, supine hamstring stretch    Consulted and Agree with Plan of Care  Patient       Patient will benefit from skilled therapeutic intervention in order to improve the following deficits and impairments:  Abnormal gait, Decreased endurance, Decreased activity tolerance, Decreased balance, Decreased mobility, Decreased range of motion, Decreased strength, Difficulty walking, Hypomobility, Increased edema, Increased fascial restricitons, Increased muscle spasms, Impaired flexibility, Impaired sensation, Improper body mechanics, Postural dysfunction, Pain  Visit Diagnosis: Sciatica, right side  Radiculopathy, lumbar region  Repeated falls  Unsteadiness on feet  Other abnormalities of  gait and mobility     Problem List Patient Active Problem List   Diagnosis Date Noted  . Rectocele 10/04/2016  . Vaginal discharge 10/04/2016  . Screening for colorectal cancer 10/04/2016  . Hot flashes 10/04/2016  . Vitamin D deficiency 02/26/2016  . Cryptogenic stroke ([place]) 08/13/2015  . Weakness of face muscles 07/25/2015  . Morbid obesity due to excess calories ([place]) 03/05/2015  . Rectal bleeding 11/05/2013  . Chronic systolic heart failure ([place]) 05/15/2013  . Asthma 04/29/2013  . Migraine variant 11/24/2012  . Weakness 11/15/2012  . Gingival disease 07/25/2012  . TIA (transient ischemic attack) 11/07/2011  . Automatic implantable cardioverter-defibrillator in situ 10/12/2011  . Hypokalemia 10/01/2011  . Tobacco abuse   . Hyperlipidemia   . Hypertension   . Gastroesophageal reflux disease   . Hypothyroidism   . Cardiomyopathy ([place]) 08/13/2010    Floria Raveling. Hartnett-Rands, MS, PT [facility] #[id number] 11/20/2017, 12:21 PM  [place] [address] [place], [place], [address] Phone: [phone]   Fax:  [phone]  Name: Melanie Cordova MRN: [id number] Date of Birth: [birth year]

## 2017-11-21 ENCOUNTER — Ambulatory Visit (INDEPENDENT_AMBULATORY_CARE_PROVIDER_SITE_OTHER): Payer: Medicare Other | Admitting: *Deleted

## 2017-11-21 ENCOUNTER — Ambulatory Visit (HOSPITAL_COMMUNITY): Payer: Medicare Other

## 2017-11-21 ENCOUNTER — Encounter (HOSPITAL_COMMUNITY): Payer: Self-pay

## 2017-11-21 DIAGNOSIS — M5416 Radiculopathy, lumbar region: Secondary | ICD-10-CM

## 2017-11-21 DIAGNOSIS — I639 Cerebral infarction, unspecified: Secondary | ICD-10-CM | POA: Diagnosis not present

## 2017-11-21 DIAGNOSIS — R2681 Unsteadiness on feet: Secondary | ICD-10-CM

## 2017-11-21 DIAGNOSIS — M5431 Sciatica, right side: Secondary | ICD-10-CM | POA: Diagnosis not present

## 2017-11-21 DIAGNOSIS — R2689 Other abnormalities of gait and mobility: Secondary | ICD-10-CM

## 2017-11-21 DIAGNOSIS — R296 Repeated falls: Secondary | ICD-10-CM

## 2017-11-21 NOTE — Therapy (Signed)
[place], [place], [address] Phone: [phone]   Fax:  [phone]  Physical Therapy Treatment  Patient Details  Name: DANAISHA CELLI MRN: [id number] Date of Birth: [birth year] Referring Provider: Ladene Artist, NP   Encounter Date: 11/21/2017  PT End of Session - 11/21/17 0905    Visit Number  9    Number of Visits  16    Date for PT Re-Evaluation  12/21/17   mini-re-assess 11/23/2017   Authorization Type  [employer]HC Medicare no copay; Medicaid [place]    Authorization - Visit Number  9    Authorization - Number of Visits  10    PT Start Time  0900    PT Stop Time  0944    PT Time Calculation (min)  44 min    Activity Tolerance  Patient tolerated treatment well    Behavior During Therapy  [facility] for tasks assessed/performed       Past Medical History:  Diagnosis Date  . Anxiety   . Arthritis   . Asthma   . Automatic implantable cardioverter-defibrillator in situ    2013, july  . Bell palsy    states has had 3 episodes  . Cardiomyopathy 08/2010   Presented with congestive heart failure; EF of 15% and 2012; hypotension on medication precludes optimal dosing  . CHF (congestive heart failure) ([place])   . Chronic systolic heart failure ([place])   . COPD (chronic obstructive pulmonary disease) ([place])    2013  . Dysrhythmia   . Gastroesophageal reflux disease   . Headache(784.0)   . Hot flashes 10/04/2016  . Hyperlipidemia   . Hypertension    09/2010-normal CMet and CBC; Lipid profile-116, 88, 25, 73  . Hypothyroidism    Recent TSH was normal.  . 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 Neuromuscular disorder (HCC)    Neuropathy, right foot and leg  . Neuropathy   . Obesity   . Pneumonia   . Seizures ([place])    last one at age 60  . Shortness of breath   . Stroke [facility])     stroke in 07/2012 and another in June, 2014  . Tobacco abuse    20 pack years    Past Surgical History:   Procedure Laterality Date  . CESAREAN SECTION     X2  . COLONOSCOPY N/A 12/16/2013   Procedure: COLONOSCOPY;  Surgeon: Danie Binder, MD;  [facility];  Service: Endoscopy;  Laterality: N/A;  10:45-moved to 10/5 @ [place] notified pt  . EP IMPLANTABLE DEVICE     St. Jude  . EP IMPLANTABLE DEVICE N/A 08/13/2015   Procedure: Loop Recorder Insertion;  Surgeon: Evans Lance, MD;  Location: [place] CV LAB;  Service: Cardiovascular;  Laterality: N/A;  . IMPLANTABLE CARDIOVERTER DEFIBRILLATOR IMPLANT N/A 10/10/2011   Procedure: IMPLANTABLE CARDIOVERTER DEFIBRILLATOR IMPLANT;  Surgeon: Evans Lance, MD;  Location: [facility] CATH LAB;  Service: Cardiovascular;  Laterality: N/A;  . MULTIPLE EXTRACTIONS WITH ALVEOLOPLASTY N/A 09/24/2012   Procedure: MULTIPLE EXTRACION #2, 4, 6, 7 ,8, 9, 11, 13, 18, 20, 21, 22, 23, 24, 25, 26, 27, 29 WITH ALVEOLOPLASTY, BIOPSY OF PALATE LESION, REMOVA RIGHT LINGUAL TORUS;  Surgeon: Gae Bon, DDS;  Location: [place];  Service: Oral Surgery;  Laterality: N/A;  . TEE WITHOUT CARDIOVERSION N/A 07/11/2012   Procedure: TRANSESOPHAGEAL ECHOCARDIOGRAM (TEE);  Surgeon: Thayer Headings, MD;  Location: [place];  Service:  Cardiovascular;  Laterality: N/A;  . TUBAL LIGATION      There were no vitals filed for this visit.  Subjective Assessment - 11/21/17 0903    Subjective  Reports 5/10 pain today in bilateral hips. Slept well awaking for bathroom only. Right leg and toes not going numb as often as they used to and right leg able to support her when she stand most of the tim enow.     Pertinent History  OA, COPD, CHF, HTN, stroke, h/o seizures, refluc, back pain, medicine allergies, prior surgery, defibrillator implant.    Limitations  Sitting;Lifting;Standing;Walking;House hold activities    How long can you sit comfortably?  30 minutes    How long can you stand comfortably?  10 minutes    How long can you walk comfortably?  depends, sometimes can't walk  upon standing because Rt leg too painful; sometimes can walk a max of 15 minutes because of pain and fatigue.    Patient Stated Goals  to be able to do more for myself    Currently in Pain?  Yes    Pain Score  5     Pain Location  Hip    Pain Orientation  Right;Left;Posterior    Pain Descriptors / Indicators  Aching    Pain Type  Chronic pain                       [facility] Adult PT Treatment/Exercise - 11/21/17 0001      Lumbar Exercises: Stretches   Active Hamstring Stretch  Right;Left;5 reps;10 seconds    Active Hamstring Stretch Limitations  towel behind knee for sciatic nerve glide    Lower Trunk Rotation Limitations  10x5" holds bil      Lumbar Exercises: Standing   Heel Raises  15 reps    Heel Raises Limitations  heel and toe    Functional Squats  15 reps    Functional Squats Limitations  from chair with armrest; hands on knees    Shoulder Extension Limitations  bil tandem balance w/ UE flex 3# bar 12 reps    Shoulder Adduction Limitations  sidestepping on blue line RTB x2 RT    Other Standing Lumbar Exercises  bil Standing hip abduction and hip diagonals with RTB x15 each      Lumbar Exercises: Supine   Bent Knee Raise  10 reps;3 seconds    Bent Knee Raise Limitations  +ab set    Bridge  15 reps    Bridge Limitations  2 sets, +ankle DF to increase posterior chain engagement      Lumbar Exercises: Sidelying   Clam Limitations  clams x15 reps      Manual Therapy   Manual Therapy  Joint mobilization    Manual therapy comments  completed seperated from other interventions    Joint Mobilization  PAIVM to L5/S1, 3x 30-45 seconds rocking for opeing on Rt side, patient in Lt sidelying, hook lying position               PT Short Term Goals - 11/20/17 7628      PT SHORT TERM GOAL #1   Title  Patient will be independent in HEP and perform as instructed regularly.    Time  4    Period  Weeks    Status  Achieved      PT SHORT TERM GOAL #2   Title   Patient will increase gait velocity to 0.40 m/s to indicate decreased  risk of falls and improve safety.    Baseline  initial - 0.25 m/s    Time  4    Period  Weeks    Status  On-going      PT SHORT TERM GOAL #3   Title  Patient will be able to perform 5xSTS in < 1:10 sec to indicate more fluid movement and decreased symptoms.     Baseline  initial - 1:32.25 sec    Time  4    Period  Weeks    Status  Partially Met      PT SHORT TERM GOAL #4   Title  Patient will report pain level at no greater than 6/10 to indicate reduction in symptoms and therefore improved functional ability.     Time  4    Period  Weeks    Status  Partially Met        PT Long Term Goals - 11/20/17 7989      PT LONG TERM GOAL #1   Title  Patient will be independent in advanced HEP and perform as instructed regularly.    Time  8    Period  Weeks    Status  On-going      PT LONG TERM GOAL #2   Title  Patient will increase gait velocity to 0.60 m/s to indicate decreased risk of falls and improve safety.    Time  8    Period  Weeks    Status  On-going      PT LONG TERM GOAL #3   Title  Patient will be able to perform 5xSTS in < 0:45 sec to indicate more fluid movement and decreased symptoms.     Time  8    Period  Weeks    Status  On-going      PT LONG TERM GOAL #4   Title  Patient will report pain level at no greater than 3/10 to indicate reduction in symptoms and therefore improved functional ability.     Time  8    Period  Weeks    Status  On-going            Plan - 11/21/17 0905    Clinical Impression Statement  Patient able to tolerate increased exercises without c/o pain increase. Continues to use SPC. Continued with established POC focusing on improving core and functional strength. Patient reporting good improvement in radicular symptoms and sleep patterns. Soreness continues as pain abates. Continue as planned, progressing as able.     Rehab Potential  Fair    Clinical Impairments  Affecting Rehab Potential  positive - prior activity level walking 3x/wk; negative - severity of pain, instability walking, comorbidity    PT Frequency  2x / week    PT Duration  8 weeks    PT Treatment/Interventions  ADLs/Self Care Home Management;DME Instruction;Gait training;Functional mobility training;Therapeutic activities;Therapeutic exercise;Balance training;Neuromuscular re-education;Patient/family education;Manual techniques;Passive range of motion;Dry needling;Energy conservation    PT Next Visit Plan  mini-re-assessment for 1oth visit; continue to prgress standing strength and core work as tolerated; Continue with supine core stabilization exercises and functional lower extremity strengthening. Consider continuing with spinal mobilization for Rt L5-S1 opening and perform STM as needed for gluteus and hamstring muscles along sciatic nerve tract.    PT Home Exercise Plan  11/02/17 - glut set, ab set, clamshell supine; 9/6: LTRs, supine hamstring stretch    Consulted and Agree with Plan of Care  Patient       Patient  will benefit from skilled therapeutic intervention in order to improve the following deficits and impairments:  Abnormal gait, Decreased endurance, Decreased activity tolerance, Decreased balance, Decreased mobility, Decreased range of motion, Decreased strength, Difficulty walking, Hypomobility, Increased edema, Increased fascial restricitons, Increased muscle spasms, Impaired flexibility, Impaired sensation, Improper body mechanics, Postural dysfunction, Pain  Visit Diagnosis: Sciatica, right side  Radiculopathy, lumbar region  Repeated falls  Unsteadiness on feet  Other abnormalities of gait and mobility     Problem List Patient Active Problem List   Diagnosis Date Noted  . Rectocele 10/04/2016  . Vaginal discharge 10/04/2016  . Screening for colorectal cancer 10/04/2016  . Hot flashes 10/04/2016  . Vitamin D deficiency 02/26/2016  . Cryptogenic stroke ([place])  08/13/2015  . Weakness of face muscles 07/25/2015  . Morbid obesity due to excess calories ([place]) 03/05/2015  . Rectal bleeding 11/05/2013  . Chronic systolic heart failure ([place]) 05/15/2013  . Asthma 04/29/2013  . Migraine variant 11/24/2012  . Weakness 11/15/2012  . Gingival disease 07/25/2012  . TIA (transient ischemic attack) 11/07/2011  . Automatic implantable cardioverter-defibrillator in situ 10/12/2011  . Hypokalemia 10/01/2011  . Tobacco abuse   . Hyperlipidemia   . Hypertension   . Gastroesophageal reflux disease   . Hypothyroidism   . Cardiomyopathy ([place]) 08/13/2010    Floria Raveling. Hartnett-Rands, MS, PT [facility] [place] #[id number] 11/21/2017, 9:37 AM  [place] [address] [place], [place], [address] Phone: [phone]   Fax:  [phone]  Name: AGGIE DOUSE MRN: [id number] Date of Birth: 22-Aug-1960

## 2017-11-22 NOTE — Progress Notes (Signed)
Carelink Summary Report / Loop Recorder 

## 2017-11-23 ENCOUNTER — Encounter (HOSPITAL_COMMUNITY): Payer: Medicare Other

## 2017-11-23 ENCOUNTER — Other Ambulatory Visit: Payer: Self-pay

## 2017-11-23 ENCOUNTER — Ambulatory Visit (INDEPENDENT_AMBULATORY_CARE_PROVIDER_SITE_OTHER): Payer: Medicare Other | Admitting: Obstetrics & Gynecology

## 2017-11-23 ENCOUNTER — Encounter: Payer: Self-pay | Admitting: Obstetrics & Gynecology

## 2017-11-23 ENCOUNTER — Other Ambulatory Visit (HOSPITAL_COMMUNITY)
Admission: RE | Admit: 2017-11-23 | Discharge: 2017-11-23 | Disposition: A | Discharge status: Home / Self Care | Payer: Medicare Other | Source: Ambulatory Visit | Attending: Obstetrics & Gynecology | Admitting: Obstetrics & Gynecology

## 2017-11-23 DIAGNOSIS — Z01419 Encounter for gynecological examination (general) (routine) without abnormal findings: Secondary | ICD-10-CM | POA: Diagnosis not present

## 2017-11-23 DIAGNOSIS — Z1212 Encounter for screening for malignant neoplasm of rectum: Secondary | ICD-10-CM | POA: Diagnosis not present

## 2017-11-23 DIAGNOSIS — R87610 Atypical squamous cells of undetermined significance on cytologic smear of cervix (ASC-US): Secondary | ICD-10-CM | POA: Insufficient documentation

## 2017-11-23 DIAGNOSIS — Z1211 Encounter for screening for malignant neoplasm of colon: Secondary | ICD-10-CM

## 2017-11-23 DIAGNOSIS — Z1151 Encounter for screening for human papillomavirus (HPV): Secondary | ICD-10-CM | POA: Insufficient documentation

## 2017-11-23 DIAGNOSIS — Z124 Encounter for screening for malignant neoplasm of cervix: Secondary | ICD-10-CM | POA: Insufficient documentation

## 2017-11-23 MED ORDER — METRONIDAZOLE 0.75 % VA GEL: VAGINAL | 0 refills | Status: DC

## 2017-11-23 NOTE — Progress Notes (Signed)
Subjective:     Melanie Cordova is a 57 y.o. female here for a routine exam.  No LMP recorded. Patient is postmenopausal. [id number] Birth Control Method:  BTL Menstrual Calendar(currently): amenorrheic  Current complaints: vaginal discharge with occasional malodor.   Current acute medical issues:  ASCVD   Recent Gynecologic History No LMP recorded. Patient is postmenopausal. Last Pap: 2018,  normal Last mammogram: 2019,  normal  Past Medical History:  Diagnosis Date  . Anxiety   . Arthritis   . Asthma   . Automatic implantable cardioverter-defibrillator in situ    2013, july  . Bell palsy    states has had 3 episodes  . Cardiomyopathy 08/2010   Presented with congestive heart failure; EF of 15% and 2012; hypotension on medication precludes optimal dosing  . CHF (congestive heart failure) ([place])   . Chronic systolic heart failure ([place])   . COPD (chronic obstructive pulmonary disease) ([place])    2013  . Dysrhythmia   . Gastroesophageal reflux disease   . Headache(784.0)   . Hot flashes 10/04/2016  . Hyperlipidemia   . Hypertension    09/2010-normal CMet and CBC; Lipid profile-116, 88, 25, 73  . Hypothyroidism    Recent TSH was normal.  . 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 Neuromuscular disorder (HCC)    Neuropathy, right foot and leg  . Neuropathy   . Obesity   . Pneumonia   . Seizures ([place])    last one at age 82  . Shortness of breath   . Stroke [facility])     stroke in 07/2012 and another in June, 2014  . Tobacco abuse    20 pack years    Past Surgical History:  Procedure Laterality Date  . CESAREAN SECTION     X2  . COLONOSCOPY N/A 12/16/2013   Procedure: COLONOSCOPY;  Surgeon: Danie Binder, MD;  [facility];  Service: Endoscopy;  Laterality: N/A;  10:45-moved to 10/5 @ [place] notified pt  . EP IMPLANTABLE DEVICE     St. Jude  . EP IMPLANTABLE DEVICE N/A 08/13/2015   Procedure: Loop Recorder Insertion;  Surgeon: Evans Lance, MD;  Location: [place] CV LAB;  Service: Cardiovascular;  Laterality: N/A;  . IMPLANTABLE CARDIOVERTER DEFIBRILLATOR IMPLANT N/A 10/10/2011   Procedure: IMPLANTABLE CARDIOVERTER DEFIBRILLATOR IMPLANT;  Surgeon: Evans Lance, MD;  Location: [facility] CATH LAB;  Service: Cardiovascular;  Laterality: N/A;  . MULTIPLE EXTRACTIONS WITH ALVEOLOPLASTY N/A 09/24/2012   Procedure: MULTIPLE EXTRACION #2, 4, 6, 7 ,8, 9, 11, 13, 18, 20, 21, 22, 23, 24, 25, 26, 27, 29 WITH ALVEOLOPLASTY, BIOPSY OF PALATE LESION, REMOVA RIGHT LINGUAL TORUS;  Surgeon: Gae Bon, DDS;  Location: [place];  Service: Oral Surgery;  Laterality: N/A;  . TEE WITHOUT CARDIOVERSION N/A 07/11/2012   Procedure: TRANSESOPHAGEAL ECHOCARDIOGRAM (TEE);  Surgeon: Thayer Headings, MD;  Location: [place];  Service: Cardiovascular;  Laterality: N/A;  . TUBAL LIGATION      OB History    Gravida  3   Para  3   Term  3   Preterm      AB      Living  3     SAB      TAB      Ectopic      Multiple      Live Births  3           Social History   Socioeconomic History  .  Marital status: Divorced    Spouse name: Not on file  . Number of children: 2  . Years of education: Not on file  . Highest education level: Not on file  Occupational History    Employer: [employer]    Comment: Works in Environmental consultant  Social Needs  . Financial resource strain: Not on file  . Food insecurity:    Worry: Not on file    Inability: Not on file  . Transportation needs:    Medical: Not on file    Non-medical: Not on file  Tobacco Use  . Smoking status: Current Every Day Smoker    Packs/day: 0.50    Years: 21.00    Pack years: 10.50    Types: Cigarettes    Start date: 07/02/1983  . Smokeless tobacco: Never Used  . Tobacco comment: 5 cigs daily 11-26-12  Substance and Sexual Activity  . Alcohol use: No    Alcohol/week: 0.0 standard drinks  . Drug use: No  . Sexual activity: Not Currently    Birth  control/protection: Abstinence, Surgical    Comment: tubal  Lifestyle  . Physical activity:    Days per week: Not on file    Minutes per session: Not on file  . Stress: Not on file  Relationships  . Social connections:    Talks on phone: Not on file    Gets together: Not on file    Attends religious service: Not on file    Active member of club or organization: Not on file    Attends meetings of clubs or organizations: Not on file    Relationship status: Not on file  Other Topics Concern  . Not on file  Social History Narrative  . Not on file    Family History  Problem Relation Age of Onset  . Cardiomyopathy Mother        ICD pacemaker-ischmic CM  . Heart failure Mother   . Hypertension Mother   . Diabetes Mother   . Thyroid disease Mother   . Cardiomyopathy Father        Deceased  . Coronary artery disease Father   . Heart failure Father   . Heart failure Brother   . Hypertension Brother   . Endometriosis Sister   . Heart disease Paternal Grandfather   . Heart disease Paternal Grandmother   . Heart disease Maternal Grandmother   . Heart disease Maternal Grandfather   . Hypertension Daughter   . Obesity Daughter   . Colon cancer Neg Hx   . Liver disease Neg Hx      Current Outpatient Medications:  .  albuterol (PROVENTIL HFA;VENTOLIN HFA) 108 (90 BASE) MCG/ACT inhaler, Inhale 2 puffs into the lungs every 6 (six) hours as needed for wheezing or shortness of breath., Disp: 1 Inhaler, Rfl: 5 .  aspirin EC 81 MG tablet, Take 81 mg by mouth 2 (two) times daily. , Disp: , Rfl:  .  budesonide-formoterol (SYMBICORT) 80-4.5 MCG/ACT inhaler, Inhale 2 puffs into the lungs 2 (two) times daily., Disp: , Rfl:  .  carvedilol (COREG) 12.5 MG tablet, Take 25 mg by mouth 2 (two) times daily with a meal., Disp: , Rfl:  .  cetirizine (ZYRTEC) 10 MG tablet, Take 10 mg by mouth daily., Disp: , Rfl:  .  Cholecalciferol (VITAMIN D3) 5000 units CAPS, TAKE ONE CAPSULE BY MOUTH DAILY, Disp:  90 capsule, Rfl: 0 .  clobetasol (TEMOVATE) 0.05 % external solution, Apply 1 application topically  2 (two) times daily as needed (Apply to scalp.). , Disp: , Rfl:  .  diclofenac sodium (VOLTAREN) 1 % GEL, Apply 2 g topically 4 (four) times daily as needed (pain). , Disp: , Rfl:  .  Ferrous Sulfate (IRON) 28 MG TABS, Take 1 tablet by mouth daily., Disp: , Rfl:  .  fluticasone (FLONASE) 50 MCG/ACT nasal spray, Place 2 sprays into both nostrils daily as needed for allergies. , Disp: , Rfl:  .  furosemide (LASIX) 40 MG tablet, Take 1 tablet (40 mg total) by mouth 2 (two) times daily., Disp: 180 tablet, Rfl: 0 .  gabapentin (NEURONTIN) 800 MG tablet, Take 800 mg by mouth 4 (four) times daily., Disp: , Rfl:  .  ketoconazole (NIZORAL) 2 % cream, Apply 1 application topically 2 (two) times daily. , Disp: , Rfl:  .  levothyroxine (SYNTHROID, LEVOTHROID) 200 MCG tablet, Take 1 tablet (200 mcg total) by mouth daily before breakfast. Takes with 56mcg for a total of 210mcg, Disp: 90 tablet, Rfl: 2 .  lisinopril (PRINIVIL,ZESTRIL) 10 MG tablet, Take 1 tablet (10 mg total) by mouth daily., Disp: 90 tablet, Rfl: 0 .  Multiple Vitamins-Minerals (CENTRAVITES 50 PLUS PO), Take by mouth daily., Disp: , Rfl:  .  potassium chloride SA (K-DUR,KLOR-CON) 20 MEQ tablet, Take 2 tablets (40 mEq total) by mouth 2 (two) times daily. (Patient taking differently: Take 20 mEq by mouth 2 (two) times daily. ), Disp: 180 tablet, Rfl: 0 .  ranitidine (ZANTAC) 150 MG tablet, Take 150 mg by mouth at bedtime., Disp: , Rfl:  .  rosuvastatin (CRESTOR) 20 MG tablet, Take 20 mg by mouth daily., Disp: , Rfl:  .  Simethicone (GAS-X PO), Take by mouth as needed., Disp: , Rfl:  .  metroNIDAZOLE (METROGEL VAGINAL) 0.75 % vaginal gel, Nightly x 5 nights, Disp: 70 g, Rfl: 0  Review of Systems  Review of Systems  Constitutional: Negative for fever, chills, weight loss, malaise/fatigue and diaphoresis.  HENT: Negative for hearing loss, ear pain,  nosebleeds, congestion, sore throat, neck pain, tinnitus and ear discharge.   Eyes: Negative for blurred vision, double vision, photophobia, pain, discharge and redness.  Respiratory: Negative for cough, hemoptysis, sputum production, shortness of breath, wheezing and stridor.   Cardiovascular: Negative for chest pain, palpitations, orthopnea, claudication, leg swelling and PND.  Gastrointestinal: negative for abdominal pain. Negative for heartburn, nausea, vomiting, diarrhea, constipation, blood in stool and melena.  Genitourinary: Negative for dysuria, urgency, frequency, hematuria and flank pain.  Musculoskeletal: Negative for myalgias, back pain, joint pain and falls.  Skin: Negative for itching and rash.  Neurological: Negative for dizziness, tingling, tremors, sensory change, speech change, focal weakness, seizures, loss of consciousness, weakness and headaches.  Endo/Heme/Allergies: Negative for environmental allergies and polydipsia. Does not bruise/bleed easily.  Psychiatric/Behavioral: Negative for depression, suicidal ideas, hallucinations, memory loss and substance abuse. The patient is not nervous/anxious and does not have insomnia.        Objective:  There were no vitals taken for this visit.   Physical Exam  Vitals reviewed. Constitutional: She is oriented to person, place, and time. She appears well-developed and well-nourished.  HENT:  Head: Normocephalic and atraumatic.        Right Ear: External ear normal.  Left Ear: External ear normal.  Nose: Nose normal.  Mouth/Throat: Oropharynx is clear and moist.  Eyes: Conjunctivae and EOM are normal. Pupils are equal, round, and reactive to light. Right eye exhibits no discharge. Left eye exhibits no discharge.  No scleral icterus.  Neck: Normal range of motion. Neck supple. No tracheal deviation present. No thyromegaly present.  Cardiovascular: Normal rate, regular rhythm, normal heart sounds and intact distal pulses.  Exam  reveals no gallop and no friction rub.   No murmur heard. Respiratory: Effort normal and breath sounds normal. No respiratory distress. She has no wheezes. She has no rales. She exhibits no tenderness.  GI: Soft. Bowel sounds are normal. She exhibits no distension and no mass. There is no tenderness. There is no rebound and no guarding.  Genitourinary:  Breasts no masses skin changes or nipple changes bilaterally      Vulva is normal without lesions Vagina is pink moist with minimal discharge Grade II cystocoele and uterine prolapse Cervix normal in appearance and pap is done Uterus is normal size shape and contour Adnexa is negative with normal sized ovaries  {Rectal    hemoccult negative, normal tone, no masses  Musculoskeletal: Normal range of motion. She exhibits no edema and no tenderness.  Neurological: She is alert and oriented to person, place, and time. She has normal reflexes. She displays normal reflexes. No cranial nerve deficit. She exhibits normal muscle tone. Coordination normal.  Skin: Skin is warm and dry. No rash noted. No erythema. No pallor.  Psychiatric: She has a normal mood and affect. Her behavior is normal. Judgment and thought content normal.       Medications Ordered at today's visit: Meds ordered this encounter  Medications  . metroNIDAZOLE (METROGEL VAGINAL) 0.75 % vaginal gel    Sig: Nightly x 5 nights    Dispense:  70 g    Refill:  0    Other orders placed at today's visit: No orders of the defined types were placed in this encounter.     Assessment:    Healthy female exam.    Plan:    Contraception: tubal ligation. Follow up in: 1 year. metro gel for BV     Return in about 2 years (around 11/24/2019) for yearly.

## 2017-11-24 ENCOUNTER — Ambulatory Visit (HOSPITAL_COMMUNITY): Payer: Medicare Other

## 2017-11-24 DIAGNOSIS — M5431 Sciatica, right side: Secondary | ICD-10-CM | POA: Diagnosis not present

## 2017-11-24 DIAGNOSIS — R2681 Unsteadiness on feet: Secondary | ICD-10-CM

## 2017-11-24 DIAGNOSIS — R2689 Other abnormalities of gait and mobility: Secondary | ICD-10-CM

## 2017-11-24 DIAGNOSIS — M5416 Radiculopathy, lumbar region: Secondary | ICD-10-CM

## 2017-11-24 DIAGNOSIS — R296 Repeated falls: Secondary | ICD-10-CM

## 2017-11-24 LAB — HEMOCCULT GUIAC POC 1CARD (OFFICE): FECAL OCCULT BLD: NEGATIVE

## 2017-11-24 NOTE — Patient Instructions (Signed)
Hip Extension: Standing (Single Leg)    In shoulder width stance, anchor tubing under one foot. Twist and put around other ankle. Pull same leg back, keeping knee nearly straight. Repeat 10__ times per set. Repeat with other leg. Do 2__ sets per session. Red theraband  http://tub.exer.us/194   Copyright  VHI. All rights reserved.   HIP: Abduction - Standing (Band)    Place band around legs. Squeeze glutes. Raise leg out and slightly back. Hold ___ seconds. Use _red_______ band. 10___ reps per set, _2__ sets per day, __1_ days per week Hold onto a support.  Copyright  VHI. All rights reserSide-Stepping    Walk to left side with eyes open. Take even steps, leading with same foot. Make sure each foot lifts off the floor. Repeat in opposite direction. Repeat for 1-2____ minutes per session. Do _1___ sessions per day. Use red band.   Copyright  VHI. All rights reserved.

## 2017-11-24 NOTE — Therapy (Addendum)
[place], [place], [address] Phone: [phone]   Fax:  [phone]  Physical Therapy Treatment/Re-Evaluation/Progress Note   Patient Details  Name: Melanie Cordova MRN: [id number] Date of Birth: [birth year] Referring Provider: Ladene Artist, PA-C   Encounter Date: 11/24/2017  PT End of Session - 11/24/17 0904    Visit Number  10    Number of Visits  16    Date for PT Re-Evaluation  12/21/17   mini-re-assess 11/23/2017   Authorization Type  UHC Medicare no copay; Medicaid [place] A    Authorization - Visit Number  10    Authorization - Number of Visits  10    PT Start Time  0902    PT Stop Time  0945    PT Time Calculation (min)  43 min    Activity Tolerance  Patient tolerated treatment well    Behavior During Therapy  [facility] for tasks assessed/performed       Past Medical History:  Diagnosis Date  . Anxiety   . Arthritis   . Asthma   . Automatic implantable cardioverter-defibrillator in situ    2013, july  . Bell palsy    states has had 3 episodes  . Cardiomyopathy 08/2010   Presented with congestive heart failure; EF of 15% and 2012; hypotension on medication precludes optimal dosing  . CHF (congestive heart failure) ([place])   . Chronic systolic heart failure ([place])   . COPD (chronic obstructive pulmonary disease) ([place])    2013  . Dysrhythmia   . Gastroesophageal reflux disease   . Headache(784.0)   . Hot flashes 10/04/2016  . Hyperlipidemia   . Hypertension    09/2010-normal CMet and CBC; Lipid profile-116, 88, 25, 73  . Hypothyroidism    Recent TSH was normal.  . 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 Neuromuscular disorder (HCC)    Neuropathy, right foot and leg  . Neuropathy   . Obesity   . Pneumonia   . Seizures ([place])    last one at age 32  . Shortness of breath   . Stroke [facility])     stroke in 07/2012 and another in June, 2014  . Tobacco abuse    20 pack years     Past Surgical History:  Procedure Laterality Date  . CESAREAN SECTION     X2  . COLONOSCOPY N/A 12/16/2013   Procedure: COLONOSCOPY;  Surgeon: Danie Binder, MD;  [facility];  Service: Endoscopy;  Laterality: N/A;  10:45-moved to 10/5 @ [place] notified pt  . EP IMPLANTABLE DEVICE     St. Jude  . EP IMPLANTABLE DEVICE N/A 08/13/2015   Procedure: Loop Recorder Insertion;  Surgeon: Evans Lance, MD;  Location: [place] CV LAB;  Service: Cardiovascular;  Laterality: N/A;  . IMPLANTABLE CARDIOVERTER DEFIBRILLATOR IMPLANT N/A 10/10/2011   Procedure: IMPLANTABLE CARDIOVERTER DEFIBRILLATOR IMPLANT;  Surgeon: Evans Lance, MD;  Location: [facility] CATH LAB;  Service: Cardiovascular;  Laterality: N/A;  . MULTIPLE EXTRACTIONS WITH ALVEOLOPLASTY N/A 09/24/2012   Procedure: MULTIPLE EXTRACION #2, 4, 6, 7 ,8, 9, 11, 13, 18, 20, 21, 22, 23, 24, 25, 26, 27, 29 WITH ALVEOLOPLASTY, BIOPSY OF PALATE LESION, REMOVA RIGHT LINGUAL TORUS;  Surgeon: Gae Bon, DDS;  Location: [place];  Service: Oral Surgery;  Laterality: N/A;  . TEE WITHOUT CARDIOVERSION N/A 07/11/2012   Procedure: TRANSESOPHAGEAL ECHOCARDIOGRAM (TEE);  Surgeon: Thayer Headings, MD;  Location: [place];  Service: Cardiovascular;  Laterality: N/A;  . TUBAL LIGATION      There were no vitals filed for this visit.  Subjective Assessment - 11/24/17 1216    Subjective  Reports her knees are hurting her quite a bit today due to the cooler weather. Resting much better at night. Performing her HEP. Right toes are not as numb as they used to be. Right leg able to hold her up when she stands most of the time now.     Currently in Pain?  Yes    Pain Score  5     Pain Location  Leg    Pain Orientation  Posterior;Distal;Proximal;Lateral;Right    Pain Descriptors / Indicators  Aching    Pain Type  Chronic pain    Pain Radiating Towards  radiates down to RLE posterior and anterior and into 4th and 5th digits    Pain Onset  More  than a month ago    Pain Frequency  Intermittent         [facility] PT Assessment - 11/24/17 0001      Assessment   Medical Diagnosis  lumbar radiculopathy, R LE sciatica    Referring Provider  Ladene Artist, PA-C    Onset Date/Surgical Date  09/26/17    Hand Dominance  Left    Next MD Visit  October 2019    Prior Therapy  No prior PT      Observation/Other Assessments   Focus on Therapeutic Outcomes (FOTO)   50% limited      Posture/Postural Control   Posture/Postural Control  Postural limitations    Postural Limitations  Rounded Shoulders;Forward head;Flexed trunk      AROM   Lumbar Flexion  62   was 60   Lumbar Extension  24   was 10   Lumbar - Right Side Bend  16   was 8   Lumbar - Left Side Bend  20   was 12   Lumbar - Right Rotation  10   was 10   Lumbar - Left Rotation  10   was 6     Strength   Right Hip Flexion  3+/5   was 2+/5   Right Hip Extension  2+/5   was 2/5   Right Hip ABduction  4-/5   was 3+/5   Left Hip Flexion  4-/5   was 3/5   Left Hip Extension  4-/5   was 3+/5   Left Hip ABduction  4/5   was 3+/5   Right Knee Flexion  4-/5   was 3+/5   Right Knee Extension  4-/5   was 3-/5   Left Knee Flexion  4/5   was 4-/5   Left Knee Extension  5/5   was 4+/5   Right Ankle Dorsiflexion  4/5   was 3-/5   Left Ankle Dorsiflexion  5/5   was 4+/5     Special Tests    Special Tests  Lumbar    Lumbar Tests  Slump Test;Straight Leg Raise      Slump test   Findings  Positive    Side  Right      Transfers   Five time sit to stand comments   41 sec   crepitus bil knees; hands on thighs during STS; was 1:32 sec     Ambulation/Gait   Ambulation Distance (Feet)  176 Feet    Assistive device  Straight cane    Gait Pattern  Step-through pattern;Decreased hip/knee flexion -  right;Decreased hip/knee flexion - left;Decreased stance time - right;Right foot flat;Left foot flat;Shuffle;Antalgic;Lateral trunk lean to left;Trunk flexed    Ambulation  Surface  Level    Gait velocity  0.49 m/s   was 0.25 m/s     Balance   Balance Assessed  Yes      Static Standing Balance   Static Standing - Balance Support  No upper extremity supported    Static Standing - Level of Assistance  5: Stand by assistance    Static Standing Balance -  Activities   Single Leg Stance - Right Leg;Single Leg Stance - Left Leg    Static Standing - Comment/# of Minutes  Rt 4 sec; Lt 4 sec                           PT Education - 11/24/17 1225    Education Details  added to HEP, exercises purpose and technique, progress towards gaols    Person(s) Educated  Patient    Methods  Explanation;Demonstration;Handout    Comprehension  Verbalized understanding;Returned demonstration       PT Short Term Goals - 11/24/17 1229      PT SHORT TERM GOAL #1   Title  Patient will be independent in HEP and perform as instructed regularly.    Time  4    Period  Weeks    Status  Achieved      PT SHORT TERM GOAL #2   Title  Patient will increase gait velocity to 0.40 m/s to indicate decreased risk of falls and improve safety.    Baseline  initial - 0.25 m/s; 11/23/17 - 0.49 m/s    Time  4    Period  Weeks    Status  Achieved      PT SHORT TERM GOAL #3   Title  Patient will be able to perform 5xSTS in < 1:10 sec to indicate more fluid movement and decreased symptoms.     Baseline  initial - 1:32.25 sec; 11/23/17 - 41 secs    Time  4    Period  Weeks    Status  Achieved      PT SHORT TERM GOAL #4   Title  Patient will report pain level at no greater than 6/10 to indicate reduction in symptoms and therefore improved functional ability.     Time  4    Period  Weeks    Status  Partially Met        PT Long Term Goals - 11/24/17 1230      PT LONG TERM GOAL #1   Title  Patient will be independent in advanced HEP and perform as instructed regularly.    Time  8    Period  Weeks    Status  On-going      PT LONG TERM GOAL #2   Title  Patient  will increase gait velocity to 0.60 m/s to indicate decreased risk of falls and improve safety.    Time  8    Period  Weeks    Status  On-going      PT LONG TERM GOAL #3   Title  Patient will be able to perform 5xSTS in < 0:45 sec to indicate more fluid movement and decreased symptoms.     Baseline  11/23/2017 -  41 sec    Time  8    Period  Weeks    Status  Achieved  PT LONG TERM GOAL #4   Title  Patient will report pain level at no greater than 3/10 to indicate reduction in symptoms and therefore improved functional ability.     Time  8    Period  Weeks    Status  On-going            Plan - 11/24/17 1226    Clinical Impression Statement  Session focused on re-assessment and progress towards goals. Patient has had improvements in AROM, strength, balance, pain, ambulation and functional abilites. Patient would continue to benefit from further skilled OPPT to continue to address deficits in pain, balance, strength, AROM, and functional abilities. Continue with current plan, progressing as able.     History and Personal Factors relevant to plan of care:  OA, COPD, claustrophobic, asthma, CHD, HTN, stroke, seizure, refluc, back pain, allergies, defibrillator    Clinical Presentation due to:  FOTO, gait, 2MWT, SLS, 5xSTS, AROM, MMT, clinical reasoning    Rehab Potential  Fair    Clinical Impairments Affecting Rehab Potential  positive - prior activity level walking 3x/wk; negative - severity of pain, instability walking, comorbidity    PT Frequency  2x / week    PT Duration  8 weeks    PT Treatment/Interventions  ADLs/Self Care Home Management;DME Instruction;Gait training;Functional mobility training;Therapeutic activities;Therapeutic exercise;Balance training;Neuromuscular re-education;Patient/family education;Manual techniques;Passive range of motion;Dry needling;Energy conservation    PT Next Visit Plan  continue to progress standing strength and core work as tolerated; Continue  with supine core stabilization exercises and functional lower extremity strengthening. Consider continuing with spinal mobilization for Rt L5-S1 opening and perform STM as needed for gluteus and hamstring muscles along sciatic nerve tract.    PT Home Exercise Plan  11/02/17 - glut set, ab set, clamshell supine; 9/6: LTRs, supine hamstring stretch    Consulted and Agree with Plan of Care  Patient       Patient will benefit from skilled therapeutic intervention in order to improve the following deficits and impairments:  Abnormal gait, Decreased endurance, Decreased activity tolerance, Decreased balance, Decreased mobility, Decreased range of motion, Decreased strength, Difficulty walking, Hypomobility, Increased edema, Increased fascial restricitons, Increased muscle spasms, Impaired flexibility, Impaired sensation, Improper body mechanics, Postural dysfunction, Pain  Visit Diagnosis: Sciatica, right side  Radiculopathy, lumbar region  Repeated falls  Unsteadiness on feet  Other abnormalities of gait and mobility     Problem List Patient Active Problem List   Diagnosis Date Noted  . Rectocele 10/04/2016  . Vaginal discharge 10/04/2016  . Screening for colorectal cancer 10/04/2016  . Hot flashes 10/04/2016  . Vitamin D deficiency 02/26/2016  . Cryptogenic stroke ([place]) 08/13/2015  . Weakness of face muscles 07/25/2015  . Morbid obesity due to excess calories ([place]) 03/05/2015  . Rectal bleeding 11/05/2013  . Chronic systolic heart failure ([place]) 05/15/2013  . Asthma 04/29/2013  . Migraine variant 11/24/2012  . Weakness 11/15/2012  . Gingival disease 07/25/2012  . TIA (transient ischemic attack) 11/07/2011  . Automatic implantable cardioverter-defibrillator in situ 10/12/2011  . Hypokalemia 10/01/2011  . Tobacco abuse   . Hyperlipidemia   . Hypertension   . Gastroesophageal reflux disease   . Hypothyroidism   . Cardiomyopathy ([place]) 08/13/2010    Floria Raveling. Hartnett-Rands,  MS, PT [id number] 11/24/2017, 12:33 PM  [place] [address] [place], [place], [address] Phone: [phone]   Fax:  [phone]  Name: ATHANASIA STANWOOD MRN: [id number]  Date of Birth: [birth year]

## 2017-11-28 ENCOUNTER — Ambulatory Visit (HOSPITAL_COMMUNITY): Payer: Medicare Other | Admitting: Physical Therapy

## 2017-11-28 ENCOUNTER — Encounter (HOSPITAL_COMMUNITY): Payer: Self-pay | Admitting: Physical Therapy

## 2017-11-28 DIAGNOSIS — M5416 Radiculopathy, lumbar region: Secondary | ICD-10-CM

## 2017-11-28 DIAGNOSIS — M5431 Sciatica, right side: Secondary | ICD-10-CM

## 2017-11-28 DIAGNOSIS — R2689 Other abnormalities of gait and mobility: Secondary | ICD-10-CM

## 2017-11-28 DIAGNOSIS — R296 Repeated falls: Secondary | ICD-10-CM

## 2017-11-28 DIAGNOSIS — R2681 Unsteadiness on feet: Secondary | ICD-10-CM

## 2017-11-28 LAB — CYTOLOGY - PAP
Diagnosis: UNDETERMINED — AB
HPV (WINDOPATH): NOT DETECTED

## 2017-11-28 NOTE — Therapy (Signed)
[place], [place], [address] Phone: [phone]   Fax:  [phone]  Physical Therapy Treatment  Patient Details  Name: Melanie Cordova MRN: [id number] Date of Birth: 03/21/1960 Referring Provider: Ladene Artist, PA-C   Encounter Date: 11/28/2017  PT End of Session - 11/28/17 0940    Visit Number  11    Number of Visits  16    Date for PT Re-Evaluation  12/21/17   mini-re-assess completed 11/24/2017   Authorization Type  UHC Medicare no copay; Medicaid [place] A    Authorization - Visit Number  11    Authorization - Number of Visits  16    PT Start Time  0900    PT Stop Time  0945    PT Time Calculation (min)  45 min    Activity Tolerance  Patient tolerated treatment well    Behavior During Therapy  [facility] for tasks assessed/performed       Past Medical History:  Diagnosis Date  . Anxiety   . Arthritis   . Asthma   . Automatic implantable cardioverter-defibrillator in situ    2013, july  . Bell palsy    states has had 3 episodes  . Cardiomyopathy 08/2010   Presented with congestive heart failure; EF of 15% and 2012; hypotension on medication precludes optimal dosing  . CHF (congestive heart failure) ([place])   . Chronic systolic heart failure ([place])   . COPD (chronic obstructive pulmonary disease) ([place])    2013  . Dysrhythmia   . Gastroesophageal reflux disease   . Headache(784.0)   . Hot flashes 10/04/2016  . Hyperlipidemia   . Hypertension    09/2010-normal CMet and CBC; Lipid profile-116, 88, 25, 73  . Hypothyroidism    Recent TSH was normal.  . 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 Neuromuscular disorder (HCC)    Neuropathy, right foot and leg  . Neuropathy   . Obesity   . Pneumonia   . Seizures ([place])    last one at age 35  . Shortness of breath   . Stroke [name])     stroke in 07/2012 and another in June, 2014  . Tobacco abuse    20 pack years    Past Surgical  History:  Procedure Laterality Date  . CESAREAN SECTION     X2  . COLONOSCOPY N/A 12/16/2013   Procedure: COLONOSCOPY;  Surgeon: Danie Binder, MD;  [facility];  Service: Endoscopy;  Laterality: N/A;  10:45-moved to 10/5 @ [place] notified pt  . EP IMPLANTABLE DEVICE     St. Jude  . EP IMPLANTABLE DEVICE N/A 08/13/2015   Procedure: Loop Recorder Insertion;  Surgeon: Evans Lance, MD;  Location: [place] CV LAB;  Service: Cardiovascular;  Laterality: N/A;  . IMPLANTABLE CARDIOVERTER DEFIBRILLATOR IMPLANT N/A 10/10/2011   Procedure: IMPLANTABLE CARDIOVERTER DEFIBRILLATOR IMPLANT;  Surgeon: Evans Lance, MD;  Location: [facility] CATH LAB;  Service: Cardiovascular;  Laterality: N/A;  . MULTIPLE EXTRACTIONS WITH ALVEOLOPLASTY N/A 09/24/2012   Procedure: MULTIPLE EXTRACION #2, 4, 6, 7 ,8, 9, 11, 13, 18, 20, 21, 22, 23, 24, 25, 26, 27, 29 WITH ALVEOLOPLASTY, BIOPSY OF PALATE LESION, REMOVA RIGHT LINGUAL TORUS;  Surgeon: Gae Bon, DDS;  Location: [place];  Service: Oral Surgery;  Laterality: N/A;  . TEE WITHOUT CARDIOVERSION N/A 07/11/2012   Procedure: TRANSESOPHAGEAL ECHOCARDIOGRAM (TEE);  Surgeon: Thayer Headings, MD;  Location: [place];  Service: Cardiovascular;  Laterality: N/A;  . TUBAL LIGATION      There were no vitals filed for this visit.  Subjective Assessment - 11/28/17 0903    Subjective  Pt states that her hips are bothering her. The pain goes down into her feet.  Since she started therapy she is about 20% better.      Pertinent History  OA, COPD, CHF, HTN, stroke, h/o seizures, refluc, back pain, medicine allergies, prior surgery, defibrillator implant.    Limitations  Sitting;Lifting;Standing;Walking;House hold activities    How long can you sit comfortably?  30 minutes    How long can you stand comfortably?  10 minutes    How long can you walk comfortably?  depends, sometimes can't walk upon standing because Rt leg too painful; sometimes can walk a max of 15  minutes because of pain and fatigue.    Patient Stated Goals  to be able to do more for myself    Currently in Pain?  Yes    Pain Score  7     Pain Location  Hip    Pain Orientation  Right;Left    Pain Descriptors / Indicators  Throbbing;Tightness    Pain Type  Chronic pain    Pain Onset  More than a month ago    Pain Frequency  Constant    Aggravating Factors   weight bearing     Pain Relieving Factors  sitting but not to long     Effect of Pain on Daily Activities  to toes                        [facility] Adult PT Treatment/Exercise - 11/28/17 0001      Exercises   Exercises  Lumbar      Lumbar Exercises: Stretches   Active Hamstring Stretch  Right;Left;5 reps;10 seconds    Active Hamstring Stretch Limitations  towel behind knee for sciatic nerve glide    Prone on Elbows Stretch Limitations  5   pulling up using back mm first then slide elbows under   Other Lumbar Stretch Exercise  hip excursions x 5       Lumbar Exercises: Standing   Heel Raises  10 reps    Heel Raises Limitations  no hands     Functional Squats  10 reps    Functional Squats Limitations  no hands     Forward Lunge  10 reps    Other Standing Lumbar Exercises  sidesteping with blue t-band x 2 RT     Other Standing Lumbar Exercises  single leg stance x 5       Lumbar Exercises: Seated   Sit to Stand  20 reps    Sit to Stand Limitations  10 each leg back mat at 22"      Lumbar Exercises: Supine   Dead Bug  10 reps    Bridge  15 reps               PT Short Term Goals - 11/24/17 1229      PT SHORT TERM GOAL #1   Title  Patient will be independent in HEP and perform as instructed regularly.    Time  4    Period  Weeks    Status  Achieved      PT SHORT TERM GOAL #2   Title  Patient will increase gait velocity to 0.40 m/s to indicate decreased risk of falls and improve safety.  Baseline  initial - 0.25 m/s; 11/23/17 - 0.49 m/s    Time  4    Period  Weeks    Status  Achieved       PT SHORT TERM GOAL #3   Title  Patient will be able to perform 5xSTS in < 1:10 sec to indicate more fluid movement and decreased symptoms.     Baseline  initial - 1:32.25 sec; 11/23/17 - 41 secs    Time  4    Period  Weeks    Status  Achieved      PT SHORT TERM GOAL #4   Title  Patient will report pain level at no greater than 6/10 to indicate reduction in symptoms and therefore improved functional ability.     Time  4    Period  Weeks    Status  Partially Met        PT Long Term Goals - 11/24/17 1230      PT LONG TERM GOAL #1   Title  Patient will be independent in advanced HEP and perform as instructed regularly.    Time  8    Period  Weeks    Status  On-going      PT LONG TERM GOAL #2   Title  Patient will increase gait velocity to 0.60 m/s to indicate decreased risk of falls and improve safety.    Time  8    Period  Weeks    Status  On-going      PT LONG TERM GOAL #3   Title  Patient will be able to perform 5xSTS in < 0:45 sec to indicate more fluid movement and decreased symptoms.     Baseline  11/23/2017 -  41 sec    Time  8    Period  Weeks    Status  Achieved      PT LONG TERM GOAL #4   Title  Patient will report pain level at no greater than 3/10 to indicate reduction in symptoms and therefore improved functional ability.     Time  8    Period  Weeks    Status  On-going            Plan - 11/28/17 0941    Clinical Impression Statement  Added lunging  for functional mobility and POE for improved mobility.  PT needs constant verbal cuing to keep core tight throughout exercises.  Pt verbalizes that she is able to rest better since starting therapy.    Rehab Potential  Fair    Clinical Impairments Affecting Rehab Potential  positive - prior activity level walking 3x/wk; negative - severity of pain, instability walking, comorbidity    PT Frequency  2x / week    PT Duration  8 weeks    PT Treatment/Interventions  ADLs/Self Care Home Management;DME  Instruction;Gait training;Functional mobility training;Therapeutic activities;Therapeutic exercise;Balance training;Neuromuscular re-education;Patient/family education;Manual techniques;Passive range of motion;Dry needling;Energy conservation    PT Next Visit Plan  continue to progress standing strength and core work as tolerated; Continue with supine core stabilization exercises and functional lower extremity strengthening. Consider continuing with spinal mobilization for Rt L5-S1 opening and perform STM as needed for gluteus and hamstring muscles along sciatic nerve tract.    PT Home Exercise Plan  11/02/17 - glut set, ab set, clamshell supine; 9/6: LTRs, supine hamstring stretch    Consulted and Agree with Plan of Care  Patient       Patient will benefit from skilled therapeutic intervention  in order to improve the following deficits and impairments:  Abnormal gait, Decreased endurance, Decreased activity tolerance, Decreased balance, Decreased mobility, Decreased range of motion, Decreased strength, Difficulty walking, Hypomobility, Increased edema, Increased fascial restricitons, Increased muscle spasms, Impaired flexibility, Impaired sensation, Improper body mechanics, Postural dysfunction, Pain  Visit Diagnosis: Sciatica, right side  Radiculopathy, lumbar region  Repeated falls  Unsteadiness on feet  Other abnormalities of gait and mobility     Problem List Patient Active Problem List   Diagnosis Date Noted  . Rectocele 10/04/2016  . Vaginal discharge 10/04/2016  . Screening for colorectal cancer 10/04/2016  . Hot flashes 10/04/2016  . Vitamin D deficiency 02/26/2016  . Cryptogenic stroke ([place]) 08/13/2015  . Weakness of face muscles 07/25/2015  . Morbid obesity due to excess calories ([place]) 03/05/2015  . Rectal bleeding 11/05/2013  . Chronic systolic heart failure ([place]) 05/15/2013  . Asthma 04/29/2013  . Migraine variant 11/24/2012  . Weakness 11/15/2012  . Gingival  disease 07/25/2012  . TIA (transient ischemic attack) 11/07/2011  . Automatic implantable cardioverter-defibrillator in situ 10/12/2011  . Hypokalemia 10/01/2011  . Tobacco abuse   . Hyperlipidemia   . Hypertension   . Gastroesophageal reflux disease   . Hypothyroidism   . Cardiomyopathy [facility]) 08/13/2010    Rayetta Humphrey, PT CLT [phone] 11/28/2017, 9:45 AM  [place] [address] [place], [place], [address] Phone: [phone]   Fax:  [phone]  Name: Melanie Cordova MRN: [id number] Date of Birth: 1960/07/06

## 2017-11-29 LAB — CUP PACEART REMOTE DEVICE CHECK
Date Time Interrogation Session: 20190809003741
Implantable Lead Model: 181
Implantable Lead Serial Number: 322336
Implantable Pulse Generator Implant Date: 20170601
MDC IDC LEAD IMPLANT DT: 20130729
MDC IDC LEAD LOCATION: 753860

## 2017-11-30 ENCOUNTER — Encounter (HOSPITAL_COMMUNITY): Payer: Self-pay | Admitting: Physical Therapy

## 2017-11-30 ENCOUNTER — Ambulatory Visit (HOSPITAL_COMMUNITY): Payer: Medicare Other | Admitting: Physical Therapy

## 2017-11-30 DIAGNOSIS — M5416 Radiculopathy, lumbar region: Secondary | ICD-10-CM

## 2017-11-30 DIAGNOSIS — M5431 Sciatica, right side: Secondary | ICD-10-CM | POA: Diagnosis not present

## 2017-11-30 DIAGNOSIS — R2681 Unsteadiness on feet: Secondary | ICD-10-CM

## 2017-11-30 DIAGNOSIS — R2689 Other abnormalities of gait and mobility: Secondary | ICD-10-CM

## 2017-11-30 DIAGNOSIS — R296 Repeated falls: Secondary | ICD-10-CM

## 2017-11-30 NOTE — Therapy (Signed)
[facility] [place], [place], [address] Phone: [phone]   Fax:  [phone]  Physical Therapy Treatment  Patient Details  Name: Melanie Cordova MRN: [id number] Date of Birth: [birth year] Referring Provider: Ladene Artist, PA-C   Encounter Date: 11/30/2017  PT End of Session - 11/30/17 0907    Visit Number  12    Number of Visits  16    Date for PT Re-Evaluation  12/21/17   mini-re-assess completed 11/24/2017   Authorization Type  [employer] Medicare no copay; Medicaid [place] [facility]    Authorization - Visit Number  12    Authorization - Number of Visits  16    PT Start Time  0902    PT Stop Time  0940    PT Time Calculation (min)  38 min    Activity Tolerance  Patient tolerated treatment well    Behavior During Therapy  [facility] for tasks assessed/performed       Past Medical History:  Diagnosis Date  . Anxiety   . Arthritis   . Asthma   . Automatic implantable cardioverter-defibrillator in situ    2013, july  . Bell palsy    states has had 3 episodes  . Cardiomyopathy 08/2010   Presented with congestive heart failure; EF of 15% and 2012; hypotension on medication precludes optimal dosing  . CHF (congestive heart failure) ([place])   . Chronic systolic heart failure ([place])   . COPD (chronic obstructive pulmonary disease) ([place])    2013  . Dysrhythmia   . Gastroesophageal reflux disease   . Headache(784.0)   . Hot flashes 10/04/2016  . Hyperlipidemia   . Hypertension    09/2010-normal CMet and CBC; Lipid profile-116, 88, 25, 73  . Hypothyroidism    Recent TSH was normal.  . 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 Neuromuscular disorder (HCC)    Neuropathy, right foot and leg  . Neuropathy   . Obesity   . Pneumonia   . Seizures ([place])    last one at age 86  . Shortness of breath   . Stroke [facility])     stroke in 07/2012 and another in June, 2014  . Tobacco abuse    20 pack years    Past Surgical  History:  Procedure Laterality Date  . CESAREAN SECTION     X2  . COLONOSCOPY N/A 12/16/2013   Procedure: COLONOSCOPY;  Surgeon: Danie Binder, MD;  Location: [facility];  Service: Endoscopy;  Laterality: N/A;  10:45-moved to 10/5 @ [place] notified pt  . EP IMPLANTABLE DEVICE     St. Jude  . EP IMPLANTABLE DEVICE N/A 08/13/2015   Procedure: Loop Recorder Insertion;  Surgeon: Evans Lance, MD;  Location: [place] CV LAB;  Service: Cardiovascular;  Laterality: N/A;  . IMPLANTABLE CARDIOVERTER DEFIBRILLATOR IMPLANT N/A 10/10/2011   Procedure: IMPLANTABLE CARDIOVERTER DEFIBRILLATOR IMPLANT;  Surgeon: Evans Lance, MD;  Location: [facility] CATH LAB;  Service: Cardiovascular;  Laterality: N/A;  . MULTIPLE EXTRACTIONS WITH ALVEOLOPLASTY N/A 09/24/2012   Procedure: MULTIPLE EXTRACION #2, 4, 6, 7 ,8, 9, 11, 13, 18, 20, 21, 22, 23, 24, 25, 26, 27, 29 WITH ALVEOLOPLASTY, BIOPSY OF PALATE LESION, REMOVA RIGHT LINGUAL TORUS;  Surgeon: Gae Bon, DDS;  Location: [place];  Service: Oral Surgery;  Laterality: N/A;  . TEE WITHOUT CARDIOVERSION N/A 07/11/2012   Procedure: TRANSESOPHAGEAL ECHOCARDIOGRAM (TEE);  Surgeon: Thayer Headings, MD;  Location: [place];  Service: Cardiovascular;  Laterality: N/A;  . TUBAL LIGATION      There were no vitals filed for this visit.  Subjective Assessment - 11/30/17 0906    Subjective  She stated her hips are aching today.     Pertinent History  OA, COPD, CHF, HTN, stroke, h/o seizures, refluc, back pain, medicine allergies, prior surgery, defibrillator implant.    Limitations  Sitting;Lifting;Standing;Walking;House hold activities    How long can you sit comfortably?  30 minutes    How long can you stand comfortably?  10 minutes    How long can you walk comfortably?  depends, sometimes can't walk upon standing because Rt leg too painful; sometimes can walk a max of 15 minutes because of pain and fatigue.    Patient Stated Goals  to be able to do more for  myself    Currently in Pain?  Yes    Pain Score  5     Pain Location  Hip    Pain Orientation  Right;Left    Pain Descriptors / Indicators  Aching    Pain Type  Chronic pain    Pain Onset  More than a month ago                       [facility] Adult PT Treatment/Exercise - 11/30/17 0001      Lumbar Exercises: Stretches   Active Hamstring Stretch  Right;Left;5 reps;10 seconds    Active Hamstring Stretch Limitations  Standing with 8 inch step    Other Lumbar Stretch Exercise  hip excursions x 5 (Flexion/extension, and lateral flexion left/right)      Lumbar Exercises: Standing   Heel Raises  10 reps    Heel Raises Limitations  no hands     Functional Squats  10 reps    Functional Squats Limitations  no hands     Forward Lunge  10 reps    Other Standing Lumbar Exercises  sidesteping with blue t-band x 2 RT 14 feet    Other Standing Lumbar Exercises  Heel taps on 4'' step x10 each LE with max verbal cues and moderate tactile cues for form. single leg stance x 5       Lumbar Exercises: Seated   Sit to Stand  20 reps    Sit to Stand Limitations  10 each leg back mat at 22"      Lumbar Exercises: Supine   Dead Bug  10 reps    Bridge  15 reps             PT Education - 11/30/17 0907    Education Details  Exercise pupose and technique.     Person(s) Educated  Patient    Methods  Explanation;Demonstration    Comprehension  Verbalized understanding       PT Short Term Goals - 11/24/17 1229      PT SHORT TERM GOAL #1   Title  Patient will be independent in HEP and perform as instructed regularly.    Time  4    Period  Weeks    Status  Achieved      PT SHORT TERM GOAL #2   Title  Patient will increase gait velocity to 0.40 m/s to indicate decreased risk of falls and improve safety.    Baseline  initial - 0.25 m/s; 11/23/17 - 0.49 m/s    Time  4    Period  Weeks    Status  Achieved  PT SHORT TERM GOAL #3   Title  Patient will be able to perform 5xSTS  in < 1:10 sec to indicate more fluid movement and decreased symptoms.     Baseline  initial - 1:32.25 sec; 11/23/17 - 41 secs    Time  4    Period  Weeks    Status  Achieved      PT SHORT TERM GOAL #4   Title  Patient will report pain level at no greater than 6/10 to indicate reduction in symptoms and therefore improved functional ability.     Time  4    Period  Weeks    Status  Partially Met        PT Long Term Goals - 11/24/17 1230      PT LONG TERM GOAL #1   Title  Patient will be independent in advanced HEP and perform as instructed regularly.    Time  8    Period  Weeks    Status  On-going      PT LONG TERM GOAL #2   Title  Patient will increase gait velocity to 0.60 m/s to indicate decreased risk of falls and improve safety.    Time  8    Period  Weeks    Status  On-going      PT LONG TERM GOAL #3   Title  Patient will be able to perform 5xSTS in < 0:45 sec to indicate more fluid movement and decreased symptoms.     Baseline  11/23/2017 -  41 sec    Time  8    Period  Weeks    Status  Achieved      PT LONG TERM GOAL #4   Title  Patient will report pain level at no greater than 3/10 to indicate reduction in symptoms and therefore improved functional ability.     Time  8    Period  Weeks    Status  On-going            Plan - 11/30/17 8118    Clinical Impression Statement  This session continued with established plan of care. This session changed hamstring stretch with 8-inch step in standing this session. This session added heel taps to improve patient's hip stability and strength. Provided tactile and verbal cues for heel taps this session. Patient tolerated all exercises well this session with intermittent rest breaks as needed.     Rehab Potential  Fair    Clinical Impairments Affecting Rehab Potential  positive - prior activity level walking 3x/wk; negative - severity of pain, instability walking, comorbidity    PT Frequency  2x / week    PT Duration  8  weeks    PT Treatment/Interventions  ADLs/Self Care Home Management;DME Instruction;Gait training;Functional mobility training;Therapeutic activities;Therapeutic exercise;Balance training;Neuromuscular re-education;Patient/family education;Manual techniques;Passive range of motion;Dry needling;Energy conservation    PT Next Visit Plan  continue to progress standing strength and core work as tolerated; Continue with supine core stabilization exercises and functional lower extremity strengthening. Consider continuing with spinal mobilization for Rt L5-S1 opening and perform STM as needed for gluteus and hamstring muscles along sciatic nerve tract.    PT Home Exercise Plan  11/02/17 - glut set, ab set, clamshell supine; 9/6: LTRs, supine hamstring stretch    Consulted and Agree with Plan of Care  Patient       Patient will benefit from skilled therapeutic intervention in order to improve the following deficits and impairments:  Abnormal gait, Decreased  endurance, Decreased activity tolerance, Decreased balance, Decreased mobility, Decreased range of motion, Decreased strength, Difficulty walking, Hypomobility, Increased edema, Increased fascial restricitons, Increased muscle spasms, Impaired flexibility, Impaired sensation, Improper body mechanics, Postural dysfunction, Pain  Visit Diagnosis: Sciatica, right side  Radiculopathy, lumbar region  Repeated falls  Unsteadiness on feet  Other abnormalities of gait and mobility     Problem List Patient Active Problem List   Diagnosis Date Noted  . Rectocele 10/04/2016  . Vaginal discharge 10/04/2016  . Screening for colorectal cancer 10/04/2016  . Hot flashes 10/04/2016  . Vitamin D deficiency 02/26/2016  . Cryptogenic stroke ([place]) 08/13/2015  . Weakness of face muscles 07/25/2015  . Morbid obesity due to excess calories ([place]) 03/05/2015  . Rectal bleeding 11/05/2013  . Chronic systolic heart failure ([place]) 05/15/2013  . Asthma 04/29/2013   . Migraine variant 11/24/2012  . Weakness 11/15/2012  . Gingival disease 07/25/2012  . TIA (transient ischemic attack) 11/07/2011  . Automatic implantable cardioverter-defibrillator in situ 10/12/2011  . Hypokalemia 10/01/2011  . Tobacco abuse   . Hyperlipidemia   . Hypertension   . Gastroesophageal reflux disease   . Hypothyroidism   . Cardiomyopathy ([place]) 08/13/2010   Clarene Critchley PT, DPT 9:44 AM, 11/30/17 [facility] [address] [place], [place], [address] Phone: [phone]   Fax:  [phone]  Name: LUDY MESSAMORE MRN: [id number] Date of Birth: [birth year]

## 2017-12-07 ENCOUNTER — Telehealth: Payer: Self-pay

## 2017-12-07 NOTE — Telephone Encounter (Signed)
LMOVM requesting that pt send manual transmission b/c home monitor has not updated in at least 14 days.    

## 2017-12-08 LAB — CUP PACEART REMOTE DEVICE CHECK
Date Time Interrogation Session: 20190911013523
Implantable Lead Location: 753860
Implantable Lead Model: 181
MDC IDC LEAD IMPLANT DT: 20130729
MDC IDC LEAD SERIAL: 322336
MDC IDC PG IMPLANT DT: 20170601

## 2017-12-11 LAB — CUP PACEART REMOTE DEVICE CHECK
Battery Remaining Longevity: 49 mo
Battery Remaining Percentage: 47 %
Battery Voltage: 2.89 V
Brady Statistic RV Percent Paced: 1 %
HIGH POWER IMPEDANCE MEASURED VALUE: 91 Ohm
HIGH POWER IMPEDANCE MEASURED VALUE: 91 Ohm
Implantable Lead Implant Date: 20130729
Implantable Lead Model: 181
Implantable Lead Serial Number: 322336
Implantable Pulse Generator Implant Date: 20130729
Lead Channel Impedance Value: 710 Ohm
Lead Channel Pacing Threshold Pulse Width: 0.5 ms
Lead Channel Sensing Intrinsic Amplitude: 12 mV
Lead Channel Setting Pacing Pulse Width: 0.5 ms
MDC IDC LEAD LOCATION: 753860
MDC IDC MSMT LEADCHNL RV PACING THRESHOLD AMPLITUDE: 0.75 V
MDC IDC PG SERIAL: 1022442
MDC IDC SESS DTM: 20190827182308
MDC IDC SET LEADCHNL RV PACING AMPLITUDE: 2.5 V
MDC IDC SET LEADCHNL RV SENSING SENSITIVITY: 0.5 mV

## 2017-12-25 ENCOUNTER — Ambulatory Visit (INDEPENDENT_AMBULATORY_CARE_PROVIDER_SITE_OTHER): Payer: Medicare Other | Admitting: *Deleted

## 2017-12-25 DIAGNOSIS — I639 Cerebral infarction, unspecified: Secondary | ICD-10-CM | POA: Diagnosis not present

## 2017-12-26 NOTE — Progress Notes (Signed)
Carelink Summary Report / Loop Recorder 

## 2017-12-28 ENCOUNTER — Observation Stay (HOSPITAL_BASED_OUTPATIENT_CLINIC_OR_DEPARTMENT_OTHER): Payer: Medicare Other

## 2017-12-28 ENCOUNTER — Observation Stay (HOSPITAL_COMMUNITY)
Admission: EM | Admit: 2017-12-28 | Discharge: 2017-12-29 | Disposition: A | Discharge status: Home / Self Care | Payer: Medicare Other | Attending: Internal Medicine | Admitting: Internal Medicine

## 2017-12-28 ENCOUNTER — Other Ambulatory Visit: Payer: Self-pay

## 2017-12-28 ENCOUNTER — Emergency Department (HOSPITAL_COMMUNITY): Payer: Medicare Other

## 2017-12-28 ENCOUNTER — Encounter (HOSPITAL_COMMUNITY): Payer: Self-pay | Admitting: Emergency Medicine

## 2017-12-28 DIAGNOSIS — I110 Hypertensive heart disease with heart failure: Secondary | ICD-10-CM | POA: Insufficient documentation

## 2017-12-28 DIAGNOSIS — Z7982 Long term (current) use of aspirin: Secondary | ICD-10-CM | POA: Insufficient documentation

## 2017-12-28 DIAGNOSIS — K219 Gastro-esophageal reflux disease without esophagitis: Secondary | ICD-10-CM | POA: Diagnosis not present

## 2017-12-28 DIAGNOSIS — I5022 Chronic systolic (congestive) heart failure: Secondary | ICD-10-CM | POA: Diagnosis not present

## 2017-12-28 DIAGNOSIS — F17210 Nicotine dependence, cigarettes, uncomplicated: Secondary | ICD-10-CM | POA: Insufficient documentation

## 2017-12-28 DIAGNOSIS — R079 Chest pain, unspecified: Secondary | ICD-10-CM | POA: Diagnosis not present

## 2017-12-28 DIAGNOSIS — E039 Hypothyroidism, unspecified: Secondary | ICD-10-CM | POA: Insufficient documentation

## 2017-12-28 DIAGNOSIS — Z79899 Other long term (current) drug therapy: Secondary | ICD-10-CM | POA: Insufficient documentation

## 2017-12-28 DIAGNOSIS — I200 Unstable angina: Secondary | ICD-10-CM

## 2017-12-28 DIAGNOSIS — J45909 Unspecified asthma, uncomplicated: Secondary | ICD-10-CM | POA: Insufficient documentation

## 2017-12-28 DIAGNOSIS — I5030 Unspecified diastolic (congestive) heart failure: Secondary | ICD-10-CM | POA: Diagnosis not present

## 2017-12-28 HISTORY — DX: Chest pain, unspecified: R07.9

## 2017-12-28 LAB — BASIC METABOLIC PANEL
ANION GAP: 7 (ref 5–15)
BUN: 14 mg/dL (ref 6–20)
CALCIUM: 8.6 mg/dL — AB (ref 8.9–10.3)
CO2: 28 mmol/L (ref 22–32)
Chloride: 102 mmol/L (ref 98–111)
Creatinine, Ser: 0.85 mg/dL (ref 0.44–1.00)
GFR calc Af Amer: >60 mL/min (ref >60)
Glucose, Bld: 104 mg/dL — ABNORMAL HIGH (ref 70–99)
POTASSIUM: 3.5 mmol/L (ref 3.5–5.1)
SODIUM: 137 mmol/L (ref 135–145)

## 2017-12-28 LAB — CBC
HCT: 42.6 % (ref 36.0–46.0)
Hemoglobin: 13.3 g/dL (ref 12.0–15.0)
MCH: 29.3 pg (ref 26.0–34.0)
MCHC: 31.2 g/dL (ref 30.0–36.0)
MCV: 93.8 fL (ref 80.0–100.0)
NRBC: 0 % (ref 0.0–0.2)
PLATELETS: 240 10*3/uL (ref 150–400)
RBC: 4.54 MIL/uL (ref 3.87–5.11)
RDW: 14.6 % (ref 11.5–15.5)
WBC: 11.2 10*3/uL — AB (ref 4.0–10.5)

## 2017-12-28 LAB — TROPONIN I
Troponin I: <0.03 ng/mL (ref <0.03)
Troponin I: <0.03 ng/mL (ref <0.03)

## 2017-12-28 LAB — ECHOCARDIOGRAM COMPLETE

## 2017-12-28 MED ORDER — ENOXAPARIN SODIUM 40 MG/0.4ML ~~LOC~~ SOLN
40.0000 mg | SUBCUTANEOUS | Status: DC
  Administered 2017-12-28 – 2017-12-29 (×2): 40 mg via SUBCUTANEOUS
  Filled 2017-12-28 (×2): qty 0.4

## 2017-12-28 MED ORDER — SODIUM CHLORIDE 0.9 % IV SOLN: 250.0000 mL | INTRAVENOUS | Status: DC | PRN

## 2017-12-28 MED ORDER — GI COCKTAIL ~~LOC~~: 30.0000 mL | Freq: Once | ORAL | Status: DC

## 2017-12-28 MED ORDER — SODIUM CHLORIDE 0.9% FLUSH
3.0000 mL | Freq: Two times a day (BID) | INTRAVENOUS | Status: DC
  Administered 2017-12-28 – 2017-12-29 (×3): 3 mL via INTRAVENOUS

## 2017-12-28 MED ORDER — FAMOTIDINE 20 MG PO TABS
20.0000 mg | ORAL_TABLET | Freq: Every day | ORAL | Status: DC
  Administered 2017-12-28 – 2017-12-29 (×2): 20 mg via ORAL
  Filled 2017-12-28 (×2): qty 1

## 2017-12-28 MED ORDER — FUROSEMIDE 40 MG PO TABS
40.0000 mg | ORAL_TABLET | Freq: Two times a day (BID) | ORAL | Status: DC
  Administered 2017-12-28 – 2017-12-29 (×3): 40 mg via ORAL
  Filled 2017-12-28 (×3): qty 1

## 2017-12-28 MED ORDER — VITAMIN D 1000 UNITS PO TABS
5000.0000 [IU] | ORAL_TABLET | Freq: Every day | ORAL | Status: DC
  Administered 2017-12-28 – 2017-12-29 (×2): 5000 [IU] via ORAL
  Filled 2017-12-28 (×2): qty 5

## 2017-12-28 MED ORDER — ACETAMINOPHEN 650 MG RE SUPP: 650.0000 mg | Freq: Four times a day (QID) | RECTAL | Status: DC | PRN

## 2017-12-28 MED ORDER — GABAPENTIN 400 MG PO CAPS
800.0000 mg | ORAL_CAPSULE | Freq: Four times a day (QID) | ORAL | Status: DC
  Administered 2017-12-28 – 2017-12-29 (×6): 800 mg via ORAL
  Filled 2017-12-28 (×6): qty 2

## 2017-12-28 MED ORDER — ONDANSETRON HCL 4 MG/2ML IJ SOLN: 4.0000 mg | Freq: Four times a day (QID) | INTRAMUSCULAR | Status: DC | PRN

## 2017-12-28 MED ORDER — LORATADINE 10 MG PO TABS
10.0000 mg | ORAL_TABLET | Freq: Every day | ORAL | Status: DC
  Administered 2017-12-28 – 2017-12-29 (×2): 10 mg via ORAL
  Filled 2017-12-28 (×2): qty 1

## 2017-12-28 MED ORDER — ONDANSETRON HCL 4 MG PO TABS: 4.0000 mg | ORAL_TABLET | Freq: Four times a day (QID) | ORAL | Status: DC | PRN

## 2017-12-28 MED ORDER — ASPIRIN EC 81 MG PO TBEC
81.0000 mg | DELAYED_RELEASE_TABLET | Freq: Two times a day (BID) | ORAL | Status: DC
  Administered 2017-12-28 – 2017-12-29 (×3): 81 mg via ORAL
  Filled 2017-12-28 (×3): qty 1

## 2017-12-28 MED ORDER — ALUM & MAG HYDROXIDE-SIMETH 200-200-20 MG/5ML PO SUSP: 15.0000 mL | Freq: Four times a day (QID) | ORAL | Status: DC | PRN

## 2017-12-28 MED ORDER — CARVEDILOL 12.5 MG PO TABS
25.0000 mg | ORAL_TABLET | Freq: Two times a day (BID) | ORAL | Status: DC
  Administered 2017-12-28 – 2017-12-29 (×2): 25 mg via ORAL
  Filled 2017-12-28 (×2): qty 2

## 2017-12-28 MED ORDER — LEVOTHYROXINE SODIUM 100 MCG PO TABS
200.0000 ug | ORAL_TABLET | Freq: Every day | ORAL | Status: DC
  Administered 2017-12-29: 200 ug via ORAL
  Filled 2017-12-28: qty 2

## 2017-12-28 MED ORDER — ROSUVASTATIN CALCIUM 20 MG PO TABS
20.0000 mg | ORAL_TABLET | Freq: Every day | ORAL | Status: DC
  Administered 2017-12-28 – 2017-12-29 (×2): 20 mg via ORAL
  Filled 2017-12-28 (×2): qty 1

## 2017-12-28 MED ORDER — SODIUM CHLORIDE 0.9% FLUSH: 3.0000 mL | INTRAVENOUS | Status: DC | PRN

## 2017-12-28 MED ORDER — ONDANSETRON 4 MG PO TBDP
4.0000 mg | ORAL_TABLET | Freq: Once | ORAL | Status: AC
  Administered 2017-12-28: 4 mg via ORAL
  Filled 2017-12-28: qty 1

## 2017-12-28 MED ORDER — ACETAMINOPHEN 325 MG PO TABS: 650.0000 mg | ORAL_TABLET | Freq: Once | ORAL | Status: DC

## 2017-12-28 MED ORDER — ACETAMINOPHEN 325 MG PO TABS: 650.0000 mg | ORAL_TABLET | Freq: Four times a day (QID) | ORAL | Status: DC | PRN

## 2017-12-28 MED ORDER — FERROUS SULFATE 325 (65 FE) MG PO TABS
325.0000 mg | ORAL_TABLET | Freq: Every day | ORAL | Status: DC
  Administered 2017-12-28 – 2017-12-29 (×2): 325 mg via ORAL
  Filled 2017-12-28 (×2): qty 1

## 2017-12-28 MED ORDER — ALBUTEROL SULFATE HFA 108 (90 BASE) MCG/ACT IN AERS
2.0000 | INHALATION_SPRAY | Freq: Four times a day (QID) | RESPIRATORY_TRACT | Status: DC | PRN
  Filled 2017-12-28: qty 6.7

## 2017-12-28 MED ORDER — NITROGLYCERIN 0.4 MG SL SUBL
0.4000 mg | SUBLINGUAL_TABLET | SUBLINGUAL | Status: AC | PRN
  Administered 2017-12-28 (×3): 0.4 mg via SUBLINGUAL
  Filled 2017-12-28: qty 1

## 2017-12-28 MED ORDER — MOMETASONE FURO-FORMOTEROL FUM 100-5 MCG/ACT IN AERO
2.0000 | INHALATION_SPRAY | Freq: Two times a day (BID) | RESPIRATORY_TRACT | Status: DC
  Administered 2017-12-28 – 2017-12-29 (×3): 2 via RESPIRATORY_TRACT
  Filled 2017-12-28: qty 8.8

## 2017-12-28 MED ORDER — ALBUTEROL SULFATE (2.5 MG/3ML) 0.083% IN NEBU: 2.5000 mg | INHALATION_SOLUTION | Freq: Four times a day (QID) | RESPIRATORY_TRACT | Status: DC | PRN

## 2017-12-28 MED ORDER — LISINOPRIL 10 MG PO TABS
10.0000 mg | ORAL_TABLET | Freq: Every day | ORAL | Status: DC
  Administered 2017-12-28: 10 mg via ORAL
  Filled 2017-12-28 (×2): qty 1

## 2017-12-28 MED ORDER — ONDANSETRON 4 MG PO TBDP
ORAL_TABLET | ORAL | Status: AC
  Filled 2017-12-28: qty 1

## 2017-12-28 NOTE — Care Management Obs Status (Signed)
[facility] NOTIFICATION   Patient Details  Name: Melanie Cordova MRN: [id number] Date of Birth: August 16, 1960   Medicare Observation Status Notification Given:  Yes    Sherald Barge, RN 12/28/2017, 2:40 PM

## 2017-12-28 NOTE — ED Notes (Signed)
St. Jude's device interrogated, Dr. Christy Gentles on phone with rep.

## 2017-12-28 NOTE — Progress Notes (Signed)
PROGRESS NOTE  Melanie Cordova:[id number] DOB: 07-Mar-1961 DOA: 12/28/2017 PCP: Rosita Fire, MD   LOS: 0 days   Brief narrative:  Melanie Cordova  is a 57 y.o. female, with history of COPD, chronic systolic CHF status post ICD placement presented to ED with complaints of chest pain for one day.   Patient stated that she felt like a sharp/shock in her chest which was followed by continuous chest pain as if something sitting on her chest.  The pain got relieved after patient received nitroglycerin in the ED.   she was then admitted to the hospital for further evaluation.  Assessment/Plan:  Active Problems:   Chest pain  Chest pain resolved at this time.  Cardiology has been consulted.  2 sets of troponins have been negative so far.  AICD interrogation has been planned.  GERD continue with ranitidine.  COPD.  Not in exacerbation.  Continue Symbicort as needed albuterol.  Chronic systolic congestive heart failure.  Continue Lasix twice a day.  Patient sees a [facility] cardiology as outpatient  Hypertension. continue Coreg.  Closely monitor blood pressure.  VTE Prophylaxis: Lovenox  Code Status:  Full code  Family Communication: No one available  Disposition Plan: Home, follow cardiology recommendation   Consultants:  Cardiology has been consulted  Procedures:  None  Antibiotics:  None  Subjective: She denies chest pain.  She denies any numbness of her legs.  Denies dyspnea.  Objective: Vitals:   12/28/17 0957 12/28/17 1059  BP: 115/82   Pulse: 69   Resp: 16   Temp: 97.6 F (36.4 C)   SpO2: 100% 98%   No intake or output data in the 24 hours ending 12/28/17 1140 There were no vitals filed for this visit.  Physical Exam:  General: Alert awake, not in obvious distress.  Obese.  HEENT: PERRLA , oral mucosa is moist.  No pharyngeal erythema  CVS: S1-S2 heard, no murmur.  Regular rate and rhythm.  Left chest wall AICD in place.  Tender around the  AICD area.  Respiratory: Diminished breath sounds bilaterally.  No crackles or wheezes  Abdomen: Soft, nontender bowel sounds are present.  Nondistended  CNS: Alert awake oriented.  No focal neurological deficits.  SKIN: Dry. No rashes.  Musculoskeletal: Normal muscle tone, power and bulk.  Bilateral lower extremity edema with mild erythema.  Data Review: I have personally reviewed the following laboratory data and studies,  CBC: Recent Labs  Lab 12/28/17 0245  WBC 11.2*  HGB 13.3  HCT 42.6  MCV 93.8  PLT 301   Basic Metabolic Panel: Recent Labs  Lab 12/28/17 0245  NA 137  K 3.5  CL 102  CO2 28  GLUCOSE 104*  BUN 14  CREATININE 0.85  CALCIUM 8.6*   Liver Function Tests: No results for input(s): AST, ALT, ALKPHOS, BILITOT, PROT, ALBUMIN in the last 168 hours. No results for input(s): LIPASE, AMYLASE in the last 168 hours. No results for input(s): AMMONIA in the last 168 hours. Cardiac Enzymes: Recent Labs  Lab 12/28/17 0245 12/28/17 0555  TROPONINI <0.03 <0.03   BNP (last 3 results) No results for input(s): BNP in the last 8760 hours.  ProBNP (last 3 results) No results for input(s): PROBNP in the last 8760 hours.  CBG: No results for input(s): GLUCAP in the last 168 hours. No results found for this or any previous visit (from the past 240 hour(s)).   Studies: Dg Chest 2 View  Result Date: 12/28/2017 CLINICAL DATA:  Chest pain  EXAM: CHEST - 2 VIEW COMPARISON:  04/26/2016 FINDINGS: No significant pleural effusion. Left-sided pacing device as before. Mild cardiomegaly with aortic atherosclerosis. Linear atelectasis at the left base. IMPRESSION: No active cardiopulmonary disease. Mild cardiomegaly with linear atelectasis at the left base Electronically Signed   By: Donavan Foil M.D.   On: 12/28/2017 03:02    Scheduled Meds: . acetaminophen  650 mg Oral Once  . aspirin EC  81 mg Oral BID  . carvedilol  25 mg Oral BID WC  . cholecalciferol  5,000 Units  Oral Daily  . enoxaparin (LOVENOX) injection  40 mg Subcutaneous Q24H  . famotidine  20 mg Oral Daily  . ferrous sulfate  325 mg Oral Daily  . furosemide  40 mg Oral BID  . gabapentin  800 mg Oral QID  . levothyroxine  200 mcg Oral QAC breakfast  . lisinopril  10 mg Oral Daily  . loratadine  10 mg Oral Daily  . mometasone-formoterol  2 puff Inhalation BID  . rosuvastatin  20 mg Oral Daily  . sodium chloride flush  3 mL Intravenous Q12H   Continuous Infusions: . sodium chloride      Time spent: 25 minutes. More than 50% of that time was spent in counseling and/or coordination of care.  Robbi Spells  [facility] Pager [phone]  If 7PM-7AM, please contact night-coverage at [email or web address], password [facility] 12/28/2017, 11:40 AM

## 2017-12-28 NOTE — ED Triage Notes (Signed)
Pt had 324 asa in route

## 2017-12-28 NOTE — ED Provider Notes (Signed)
Patient called out due to numbness in her right leg.  She reports that from her knee down.  Distal pulses intact.  She can move her right leg without difficulty. She reports she has had this frequently, it affects both legs at times. No other neurologic complaints at this time. Low suspicion for acute CVA at this time.  I also spoke to the [facility] rep, and there has been no events noted on her defibrillator   Ripley Fraise, MD 12/28/17 [phone]

## 2017-12-28 NOTE — ED Notes (Signed)
Pt reports chest pain is gone but c/o r foot feeling numb.  Pt says has problems with feet and legs going numb in the past.  Will notify EDP.

## 2017-12-28 NOTE — H&P (Signed)
                                                                            [facility] H&P    Patient Demographics:    Melanie Cordova, is a 57 y.o. female  MRN: [id number]  DOB - 05/15/1960  Admit Date - 12/28/2017  Referring MD/NP/PA: Dr. Wickline  Outpatient Primary MD for the patient is Fanta, Tesfaye, MD  Patient coming from: Home  Chief complaint-chest pain   HPI:    Melanie Cordova  is a 57 y.o. female, with history of COPD, chronic systolic CHF status post ICD placement came to ED with complaints of chest pain started last night.  Patient says that she felt like a sharp/shock in her chest which was followed by continuous chest pain as if something sitting on her chest.  The pain got relieved after patient received nitroglycerin in the ED.  Patient says that the pain was persistent and also had numbness around the jaw/face.  She complains of mild shortness of breath.  No nausea vomiting or diarrhea.  At this time pain has resolved.  Patient never had ICD fired previously. Denies fever or chills. No history of stroke or seizures. Denies dysuria urgency or frequency of urination.    Review of systems:      All other systems reviewed and are negative.   With Past History of the following :    Past Medical History:  Diagnosis Date  . Anxiety   . Arthritis   . Asthma   . Automatic implantable cardioverter-defibrillator in situ    2013, july  . Bell palsy    states has had 3 episodes  . Cardiomyopathy 08/2010   Presented with congestive heart failure; EF of 15% and 2012; hypotension on medication precludes optimal dosing  . CHF (congestive heart failure) (HCC)   . Chronic systolic heart failure (HCC)   . COPD (chronic obstructive pulmonary disease) (HCC)    2013  . Dysrhythmia   . Gastroesophageal reflux disease   . Headache(784.0)   . Hot flashes 10/04/2016  . Hyperlipidemia   . Hypertension    09/2010-normal CMet and CBC; Lipid profile-116, 88, 25, 73  .  Hypothyroidism    Recent TSH was normal.  . 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 Neuromuscular disorder (HCC)    Neuropathy, right foot and leg  . Neuropathy   . Obesity   . Pneumonia   . Seizures (HCC)    last one at age 29  . Shortness of breath   . Stroke (HCC)     stroke in 07/2012 and another in June, 2014  . Tobacco abuse    20 pack years      Past Surgical History:  Procedure Laterality Date  . CESAREAN SECTION     X2  . COLONOSCOPY N/A 12/16/2013   Procedure: COLONOSCOPY;  Surgeon: Sandi L Fields, MD;  [facility];  Service: Endoscopy;  Laterality: N/A;  10:45-moved to 10/5 @ 930 Leigh Ann notified pt  . EP IMPLANTABLE DEVICE     St. Jude  . EP IMPLANTABLE DEVICE N/A 08/13/2015   Procedure: Loop Recorder Insertion;  Surgeon: Gregg W Taylor, MD;    Location: [facility];  Service: Cardiovascular;  Laterality: N/A;  . IMPLANTABLE CARDIOVERTER DEFIBRILLATOR IMPLANT N/A 10/10/2011   Procedure: IMPLANTABLE CARDIOVERTER DEFIBRILLATOR IMPLANT;  Surgeon: Evans Lance, MD;  Location: [facility] C[facility];  Service: Cardiovascular;  Laterality: N/A;  . MULTIPLE EXTRACTIONS WITH ALVEOLOPLASTY N/A 09/24/2012   Procedure: MULTIPLE EXTRACION #2, 4, 6, 7 ,8, 9, 11, 13, 18, 20, 21, 22, 23, 24, 25, 26, 27, 29 WITH ALVEOLOPLASTY, BIOPSY OF PALATE LESION, REMOVA RIGHT LINGUAL TORUS;  Surgeon: Gae Bon, DDS;  Location: [facility];  Service: Oral Surgery;  Laterality: N/A;  . TEE WITHOUT CARDIOVERSION N/A 07/11/2012   Procedure: TRANSESOPHAGEAL ECHOCARDIOGRAM (TEE);  Surgeon: Thayer Headings, MD;  Location: [facility];  Service: Cardiovascular;  Laterality: N/A;  . TUBAL LIGATION        Social History:      Social History   Tobacco Use  . Smoking status: Current Every Day Smoker    Packs/day: 0.50    Years: 21.00    Pack years: 10.50    Types: Cigarettes    Start date: 07/02/1983  . Smokeless tobacco: Never Used  . Tobacco comment: 5 cigs daily  11-26-12  Substance Use Topics  . Alcohol use: No    Alcohol/week: 0.0 standard drinks       Family History :     Family History  Problem Relation Age of Onset  . Cardiomyopathy Mother        ICD pacemaker-ischmic CM  . Heart failure Mother   . Hypertension Mother   . Diabetes Mother   . Thyroid disease Mother   . Cardiomyopathy Father        Deceased  . Coronary artery disease Father   . Heart failure Father   . Heart failure Brother   . Hypertension Brother   . Endometriosis Sister   . Heart disease Paternal Grandfather   . Heart disease Paternal Grandmother   . Heart disease Maternal Grandmother   . Heart disease Maternal Grandfather   . Hypertension Daughter   . Obesity Daughter   . Colon cancer Neg Hx   . Liver disease Neg Hx       Home Medications:   Prior to Admission medications   Medication Sig Start Date End Date Taking? Authorizing Provider  albuterol (PROVENTIL HFA;VENTOLIN HFA) 108 (90 BASE) MCG/ACT inhaler Inhale 2 puffs into the lungs every 6 (six) hours as needed for wheezing or shortness of breath. 07/12/12   Trinda Pascal, MD  aspirin EC 81 MG tablet Take 81 mg by mouth 2 (two) times daily.     [provider]  budesonide-formoterol (SYMBICORT) 80-4.5 MCG/ACT inhaler Inhale 2 puffs into the lungs 2 (two) times daily.    [provider]  carvedilol (COREG) 12.5 MG tablet Take 25 mg by mouth 2 (two) times daily with a meal.    [provider]  cetirizine (ZYRTEC) 10 MG tablet Take 10 mg by mouth daily.    [provider]  Cholecalciferol (VITAMIN D3) 5000 units CAPS TAKE ONE CAPSULE BY MOUTH DAILY 09/22/17   [name], MD  clobetasol (TEMOVATE) 0.05 % external solution Apply 1 application topically 2 (two) times daily as needed (Apply to scalp.).  07/24/15   [provider]  diclofenac sodium (VOLTAREN) 1 % GEL Apply 2 g topically 4 (four) times daily as needed (pain).     [provider]    Ferrous Sulfate (IRON) 28 MG TABS Take 1 tablet by  mouth daily.    [provider]  fluticasone (FLONASE) 50 MCG/ACT nasal spray Place 2 sprays into both nostrils daily as needed for allergies.     [provider]  furosemide (LASIX) 40 MG tablet Take 1 tablet (40 mg total) by mouth 2 (two) times daily. 08/29/12   Rothbart, Robert M, MD  gabapentin (NEURONTIN) 800 MG tablet Take 800 mg by mouth 4 (four) times daily. 10/30/15   [provider]  ketoconazole (NIZORAL) 2 % cream Apply 1 application topically 2 (two) times daily.  10/21/15   [provider]  levothyroxine (SYNTHROID, LEVOTHROID) 200 MCG tablet Take 1 tablet (200 mcg total) by mouth daily before breakfast. Takes with 25mcg for a total of 225mcg 08/31/17   Nida, Gebreselassie W, MD  lisinopril (PRINIVIL,ZESTRIL) 10 MG tablet Take 1 tablet (10 mg total) by mouth daily. 08/29/12   Rothbart, Robert M, MD  Multiple Vitamins-Minerals (CENTRAVITES 50 PLUS PO) Take by mouth daily.    [provider]  ranitidine (ZANTAC) 150 MG tablet Take 150 mg by mouth at bedtime.    [provider]  rosuvastatin (CRESTOR) 20 MG tablet Take 20 mg by mouth daily.    [provider]  Simethicone (GAS-X PO) Take by mouth as needed.    [provider]     Allergies:     Allergies  Allergen Reactions  . Fish Allergy Anaphylaxis  . Iodinated Diagnostic Agents Hives and Other (See Comments)    Pulmonary problems; no frank respiratory arrest  . Iodine Hives and Other (See Comments)    Pulmonary Problems   . Lentil Anaphylaxis  . Penicillins Anaphylaxis and Shortness Of Breath    Has patient had a PCN reaction causing immediate rash, facial/tongue/throat swelling, SOB or lightheadedness with hypotension: Yes Has patient had a PCN reaction causing severe rash involving mucus membranes or skin necrosis: No Has patient had a PCN reaction that required hospitalization No Has patient had a PCN  reaction occurring within the last 10 years: No If all of the above answers are "NO", then may proceed with Cephalosporin use.  Hair loss  . Lipitor [Atorvastatin] Itching and Rash  . Spironolactone Rash  . Sulfa Antibiotics Other (See Comments)    Unknown     Physical Exam:   Vitals  Blood pressure 106/67, pulse 76, temperature 98.1 F (36.7 C), temperature source Oral, resp. rate 18, SpO2 97 %.  1.  General: Appears in no acute distress  2. Psychiatric:  Intact judgement and  insight, awake alert, oriented x 3.  3. Neurologic: No focal neurological deficits, all cranial nerves intact.Strength 5/5 all 4 extremities, sensation intact all 4 extremities, plantars down going.  4. Eyes :  anicteric sclerae, moist conjunctivae with no lid lag. PERRLA.  5. ENMT:  Oropharynx clear with moist mucous membranes and good dentition  6. Neck:  supple, no cervical lymphadenopathy appriciated, No thyromegaly  7. Respiratory : Normal respiratory effort, good air movement bilaterally,clear to  auscultation bilaterally  8. Cardiovascular : RRR, no gallops, rubs or murmurs, no leg edema  9. Gastrointestinal:  Positive bowel sounds, abdomen soft, non-tender to palpation,no hepatosplenomegaly, no rigidity or guarding       10. Skin:  No cyanosis, normal texture and turgor, no rash, lesions or ulcers  11.Musculoskeletal:  Good muscle tone,  joints appear normal , no effusions,  normal range of motion    Data Review:    CBC Recent Labs  Lab 12/28/17 0245  WBC 11.2*    HGB 13.3  HCT 42.6  PLT 240  MCV 93.8  MCH 29.3  MCHC 31.2  RDW 14.6   ------------------------------------------------------------------------------------------------------------------  Results for orders placed or performed during the hospital encounter of 12/28/17 (from the past 48 hour(s))  Basic metabolic panel     Status: Abnormal   Collection Time: 12/28/17  2:45 AM  Result Value Ref Range   Sodium  137 135 - 145 mmol/L   Potassium 3.5 3.5 - 5.1 mmol/L   Chloride 102 98 - 111 mmol/L   CO2 28 22 - 32 mmol/L   Glucose, Bld 104 (H) 70 - 99 mg/dL   BUN 14 6 - 20 mg/dL   Creatinine, Ser 0.85 0.44 - 1.00 mg/dL   Calcium 8.6 (L) 8.9 - 10.3 mg/dL   GFR calc non Af Amer >60 >60 mL/min   GFR calc Af Amer >60 >60 mL/min    Comment: (NOTE) The eGFR has been calculated using the CKD EPI equation. This calculation has not been validated in all clinical situations. eGFR's persistently <60 mL/min signify possible Chronic Kidney Disease.    Anion gap 7 5 - 15    Comment: Performed at [facility], [address] [place], [place] [address]  CBC     Status: Abnormal   Collection Time: 12/28/17  2:45 AM  Result Value Ref Range   WBC 11.2 (H) 4.0 - 10.5 K/uL   RBC 4.54 3.87 - 5.11 MIL/uL   Hemoglobin 13.3 12.0 - 15.0 g/dL   HCT 42.6 36.0 - 46.0 %   MCV 93.8 80.0 - 100.0 fL   MCH 29.3 26.0 - 34.0 pg   MCHC 31.2 30.0 - 36.0 g/dL   RDW 14.6 11.5 - 15.5 %   Platelets 240 150 - 400 K/uL   nRBC 0.0 0.0 - 0.2 %    Comment: Performed at [facility], [address] [place], [place] [address]  Troponin I     Status: None   Collection Time: 12/28/17  2:45 AM  Result Value Ref Range   Troponin I <0.03 <0.03 ng/mL    Comment: Performed at  Hospital, [address] , [place] [address]    Chemistries  Recent Labs  Lab 12/28/17 0245  NA 137  K 3.5  CL 102  CO2 28  GLUCOSE 104*  BUN 14  CREATININE 0.85  CALCIUM 8.6*   ------------------------------------------------------------------------------------------------------------------  ------------------------------------------------------------------------------------------------------------------ GFR: CrCl cannot be calculated (Unknown ideal weight.). Liver Function Tests: No results for input(s): AST, ALT, ALKPHOS, BILITOT, PROT, ALBUMIN in the last 168 hours. No results for input(s): LIPASE, AMYLASE in the last 168 hours. No  results for input(s): AMMONIA in the last 168 hours. Coagulation Profile: No results for input(s): INR, PROTIME in the last 168 hours. Cardiac Enzymes: Recent Labs  Lab 12/28/17 0245  TROPONINI <0.03   BNP (last 3 results) No results for input(s): PROBNP in the last 8760 hours. HbA1C: No results for input(s): HGBA1C in the last 72 hours. CBG: No results for input(s): GLUCAP in the last 168 hours. Lipid Profile: No results for input(s): CHOL, HDL, LDLCALC, TRIG, CHOLHDL, LDLDIRECT in the last 72 hours. Thyroid Function Tests: No results for input(s): TSH, T4TOTAL, FREET4, T3FREE, THYROIDAB in the last 72 hours. Anemia Panel: No results for input(s): VITAMINB12, FOLATE, FERRITIN, TIBC, IRON, RETICCTPCT in the last 72 hours.  --------------------------------------------------------------------------------------------------------------- Urine analysis:    Component Value Date/Time   COLORURINE YELLOW 07/26/2015 0300   APPEARANCEUR CLEAR 07/26/2015 0300   LABSPEC <1.005 (L) 07/26/2015   0300   PHURINE 5.0 07/26/2015 0300   GLUCOSEU NEGATIVE 07/26/2015 0300   HGBUR NEGATIVE 07/26/2015 0300   BILIRUBINUR NEGATIVE 07/26/2015 0300   KETONESUR NEGATIVE 07/26/2015 0300   PROTEINUR NEGATIVE 07/26/2015 0300   UROBILINOGEN 0.2 04/29/2013 1702   NITRITE NEGATIVE 07/26/2015 0300   LEUKOCYTESUR TRACE (A) 07/26/2015 0300      Imaging Results:    Dg Chest 2 View  Result Date: 12/28/2017 CLINICAL DATA:  Chest pain EXAM: CHEST - 2 VIEW COMPARISON:  04/26/2016 FINDINGS: No significant pleural effusion. Left-sided pacing device as before. Mild cardiomegaly with aortic atherosclerosis. Linear atelectasis at the left base. IMPRESSION: No active cardiopulmonary disease. Mild cardiomegaly with linear atelectasis at the left base Electronically Signed   By: Donavan Foil M.D.   On: 12/28/2017 03:02    My personal review of EKG: Rhythm NSR, T wave inversions in lead V4, V5, V6, unchanged from  previous EKG from 2017, QT 503   Assessment & Plan:    Active Problems:   Chest pain   1. Chest pain-resolved, placed under observation to rule out ACS.  Obtain serial troponin every 6 hours x3.  Patient also had shocklike sensation has ICD in place.  Will consult cardiology.  She will need ICD interrogation.  2. GERD-continue ranitidine, will give 1 dose of GI cocktail in the ED.  3. COPD-stable, no exacerbation.  Continue Symbicort, PRN albuterol.  4. Chronic systolic CHF-continue Lasix 40 mg p.o. twice daily  5. Hypertension-blood pressure stable, continue Coreg   DVT Prophylaxis-   Lovenox   AM Labs Ordered, also please review Full Orders  Family Communication: Admission, patients condition and plan of care including tests being ordered have been discussed with the patient  who indicate understanding and agree with the plan and Code Status.  Code Status: Full code  Admission status: Observation  Time spent in minutes : 60 minutes   Oswald Hillock M.D on 12/28/2017 at 5:06 AM  Between 7am to 7pm - Pager - [phone]. After 7pm go to [email or web address] - password [facility]  [facility] - Office  [phone]

## 2017-12-28 NOTE — ED Triage Notes (Addendum)
Pt /co left side upper chest pain started about midnight. Pt had pain around jaw line that lasted approx one hour but no jaw pain now. C/o nausea then, not now. Non diaphoretic. States pain started as sharp, then shocking, then pressure. cbg in route 114

## 2017-12-28 NOTE — ED Provider Notes (Signed)
[facility] EMERGENCY DEPARTMENT Provider Note   CSN: [id number] Arrival date & time: 12/28/17  4098     History   Chief Complaint Chief Complaint  Patient presents with  . Chest Pain    HPI Melanie Cordova is a 57 y.o. female.  The history is provided by the patient.  Chest Pain   This is a new problem. The current episode started 1 to 2 hours ago. The problem occurs constantly. The problem has been gradually improving. The pain is associated with rest. The pain is severe. The quality of the pain is described as pressure-like. The pain radiates to the left jaw. Associated symptoms include nausea and shortness of breath. Pertinent negatives include no abdominal pain, no diaphoresis, no syncope and no vomiting. Treatments tried: ASA. The treatment provided moderate relief. Risk factors include obesity.  Her past medical history is significant for hypertension.  Patient with history of asthma, CHF, COPD, ICD in place presents with chest pain. She reports that earlier tonight, she began having jaw pain, then felt a sharp/shock in her chest and since that time has had continuous chest pressure as if something is sitting on her chest.  She reports nausea and shortness of breath.  She reports jaw pain as well.  No fevers or vomiting.  No diaphoresis.  She reports a recent history of chest pain, but not this severe.  She has never had an ICD shock previously.  She does not get chest pain often. Past Medical History:  Diagnosis Date  . Anxiety   . Arthritis   . Asthma   . Automatic implantable cardioverter-defibrillator in situ    2013, july  . Bell palsy    states has had 3 episodes  . Cardiomyopathy 08/2010   Presented with congestive heart failure; EF of 15% and 2012; hypotension on medication precludes optimal dosing  . CHF (congestive heart failure) ([place])   . Chronic systolic heart failure ([place])   . COPD (chronic obstructive pulmonary disease) ([place])    2013  . Dysrhythmia   .  Gastroesophageal reflux disease   . Headache(784.0)   . Hot flashes 10/04/2016  . Hyperlipidemia   . Hypertension    09/2010-normal CMet and CBC; Lipid profile-116, 88, 25, 73  . Hypothyroidism    Recent TSH was normal.  . 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 Neuromuscular disorder (HCC)    Neuropathy, right foot and leg  . Neuropathy   . Obesity   . Pneumonia   . Seizures ([place])    last one at age 21  . Shortness of breath   . Stroke [facility])     stroke in 07/2012 and another in June, 2014  . Tobacco abuse    20 pack years    Patient Active Problem List   Diagnosis Date Noted  . Rectocele 10/04/2016  . Vaginal discharge 10/04/2016  . Screening for colorectal cancer 10/04/2016  . Hot flashes 10/04/2016  . Vitamin D deficiency 02/26/2016  . Cryptogenic stroke ([place]) 08/13/2015  . Weakness of face muscles 07/25/2015  . Morbid obesity due to excess calories ([place]) 03/05/2015  . Rectal bleeding 11/05/2013  . Chronic systolic heart failure ([place]) 05/15/2013  . Asthma 04/29/2013  . Migraine variant 11/24/2012  . Weakness 11/15/2012  . Gingival disease 07/25/2012  . TIA (transient ischemic attack) 11/07/2011  . Automatic implantable cardioverter-defibrillator in situ 10/12/2011  . Hypokalemia 10/01/2011  . Tobacco abuse   . Hyperlipidemia   . Hypertension   .  Gastroesophageal reflux disease   . Hypothyroidism   . Cardiomyopathy ([place]) 08/13/2010    Past Surgical History:  Procedure Laterality Date  . CESAREAN SECTION     X2  . COLONOSCOPY N/A 12/16/2013   Procedure: COLONOSCOPY;  Surgeon: Danie Binder, MD;  [facility];  Service: Endoscopy;  Laterality: N/A;  10:45-moved to 10/5 @ [place] notified pt  . EP IMPLANTABLE DEVICE     St[employer] Jude  . EP IMPLANTABLE DEVICE N/A 08/13/2015   Procedure: Loop Recorder Insertion;  Surgeon: Evans Lance, MD;  Location: [place] CV LAB;  Service: Cardiovascular;  Laterality: N/A;  . IMPLANTABLE  CARDIOVERTER DEFIBRILLATOR IMPLANT N/A 10/10/2011   Procedure: IMPLANTABLE CARDIOVERTER DEFIBRILLATOR IMPLANT;  Surgeon: Evans Lance, MD;  Location[facility] CATH LAB;  Service: Cardiovascular;  Laterality: N/A;  . MULTIPLE EXTRACTIONS WITH ALVEOLOPLASTY N/A 09/24/2012   Procedure: MULTIPLE EXTRACION #2, 4, 6, 7 ,8, 9, 11, 13, 18, 20, 21, 22, 23, 24, 25, 26, 27, 29 WITH ALVEOLOPLASTY, BIOPSY OF PALATE LESION, REMOVA RIGHT LINGUAL TORUS;  Surgeon: Gae Bon, DDS;  Location: [place];  Service: Oral Surgery;  Laterality: N/A;  . TEE WITHOUT CARDIOVERSION N/A 07/11/2012   Procedure: TRANSESOPHAGEAL ECHOCARDIOGRAM (TEE);  Surgeon: Thayer Headings, MD;  Location: [facility] ENDOSCOPY;  Service: Cardiovascular;  Laterality: N/A;  . TUBAL LIGATION       OB History    Gravida  3   Para  3   Term  3   Preterm      AB      Living  3     SAB      TAB      Ectopic      Multiple      Live Births  3            Home Medications    Prior to Admission medications   Medication Sig Start Date End Date Taking? Authorizing Provider  albuterol (PROVENTIL HFA;VENTOLIN HFA) 108 (90 BASE) MCG/ACT inhaler Inhale 2 puffs into the lungs every 6 (six) hours as needed for wheezing or shortness of breath. 07/12/12   Trinda Pascal, MD  aspirin EC 81 MG tablet Take 81 mg by mouth 2 (two) times daily.     [provider]  budesonide-formoterol (SYMBICORT) 80-4.5 MCG/ACT inhaler Inhale 2 puffs into the lungs 2 (two) times daily.    [provider]  carvedilol (COREG) 12.5 MG tablet Take 25 mg by mouth 2 (two) times daily with a meal.    [provider]  cetirizine (ZYRTEC) 10 MG tablet Take 10 mg by mouth daily.    [provider]  Cholecalciferol (VITAMIN D3) 5000 units CAPS TAKE ONE CAPSULE BY MOUTH DAILY 09/22/17   [name], MD  clobetasol (TEMOVATE) 0.05 % external solution Apply 1 application topically 2 (two) times daily as needed (Apply to scalp.).  07/24/15    [provider]  diclofenac sodium (VOLTAREN) 1 % GEL Apply 2 g topically 4 (four) times daily as needed (pain).     [provider]  Ferrous Sulfate (IRON) 28 MG TABS Take 1 tablet by mouth daily.    [provider]  fluticasone (FLONASE) 50 MCG/ACT nasal spray Place 2 sprays into both nostrils daily as needed for allergies.     [provider]  furosemide (LASIX) 40 MG tablet Take 1 tablet (40 mg total) by mouth 2 (two) times daily. 08/29/12   Yehuda Savannah,  MD  gabapentin (NEURONTIN) 800 MG tablet Take 800 mg by mouth 4 (four) times daily. 10/30/15   [provider]  ketoconazole (NIZORAL) 2 % cream Apply 1 application topically 2 (two) times daily.  10/21/15   [provider]  levothyroxine (SYNTHROID, LEVOTHROID) 200 MCG tablet Take 1 tablet (200 mcg total) by mouth daily before breakfast. Takes with 70mcg for a total of 23mcg 08/31/17   Nida, Marella Chimes, MD  lisinopril (PRINIVIL,ZESTRIL) 10 MG tablet Take 1 tablet (10 mg total) by mouth daily. 08/29/12   Yehuda Savannah, MD  Multiple Vitamins-Minerals (CENTRAVITES 50 PLUS PO) Take by mouth daily.    [provider]  ranitidine (ZANTAC) 150 MG tablet Take 150 mg by mouth at bedtime.    [provider]  rosuvastatin (CRESTOR) 20 MG tablet Take 20 mg by mouth daily.    [provider]  Simethicone (GAS-X PO) Take by mouth as needed.    [provider]    Family History Family History  Problem Relation Age of Onset  . Cardiomyopathy Mother        ICD pacemaker-ischmic CM  . Heart failure Mother   . Hypertension Mother   . Diabetes Mother   . Thyroid disease Mother   . Cardiomyopathy Father        Deceased  . Coronary artery disease Father   . Heart failure Father   . Heart failure Brother   . Hypertension Brother   . Endometriosis Sister   . Heart disease Paternal Grandfather   . Heart disease Paternal Grandmother   . Heart disease  Maternal Grandmother   . Heart disease Maternal Grandfather   . Hypertension Daughter   . Obesity Daughter   . Colon cancer Neg Hx   . Liver disease Neg Hx     Social History Social History   Tobacco Use  . Smoking status: Current Every Day Smoker    Packs/day: 0.50    Years: 21.00    Pack years: 10.50    Types: Cigarettes    Start date: 07/02/1983  . Smokeless tobacco: Never Used  . Tobacco comment: 5 cigs daily 11-26-12  Substance Use Topics  . Alcohol use: No    Alcohol/week: 0.0 standard drinks  . Drug use: No     Allergies   Fish allergy; Iodinated diagnostic agents; Iodine; Lentil; Penicillins; Lipitor [atorvastatin]; Spironolactone; and Sulfa antibiotics   Review of Systems Review of Systems  Constitutional: Negative for diaphoresis.  Respiratory: Positive for shortness of breath.   Cardiovascular: Positive for chest pain. Negative for syncope.  Gastrointestinal: Positive for nausea. Negative for abdominal pain and vomiting.  Neurological: Positive for light-headedness. Negative for syncope.  All other systems reviewed and are negative.    Physical Exam Updated Vital Signs BP 110/66 (BP Location: Right Arm)   Pulse 74   Temp 98.2 F (36.8 C) (Oral)   Resp 19   SpO2 98%   Physical Exam CONSTITUTIONAL: Chronically ill-appearing, no acute distress HEAD: Normocephalic/atraumatic EYES: EOMI/PERRL ENMT: Mucous membranes moist NECK: supple no meningeal signs SPINE/BACK:entire spine nontender CV: S1/S2 noted, no murmurs/rubs/gallops noted LUNGS: Lungs are clear to auscultation bilaterally, no apparent distress ABDOMEN: soft, nontender, no rebound or guarding, bowel sounds noted throughout abdomen GU:no cva tenderness NEURO: Pt is awake/alert/appropriate, moves all extremitiesx4.  No facial droop.   EXTREMITIES: pulses normal/equalx4, full ROM SKIN: warm, color normal PSYCH: no abnormalities of mood noted, alert and oriented to situation   ED Treatments  / Results  Labs (all labs ordered are listed, but only abnormal results are displayed) Labs Reviewed  BASIC METABOLIC PANEL - Abnormal; Notable for the following components:      Result Value   Glucose, Bld 104 (*)    Calcium 8.6 (*)    All other components within normal limits  CBC - Abnormal; Notable for the following components:   WBC 11.2 (*)    All other components within normal limits  TROPONIN I    EKG EKG Interpretation  Date/Time:  Thursday December 28 2017 02:32:17 EDT Ventricular Rate:  73 PR Interval:    QRS Duration: 113 QT Interval:  456 QTC Calculation: 503 R Axis:   -20 Text Interpretation:  Sinus rhythm Ventricular premature complex Borderline intraventricular conduction delay Low voltage, precordial leads Borderline abnrm T, anterolateral leads Borderline prolonged QT interval Confirmed by Ripley Fraise [phone]) on 12/28/2017 2:38:58 AM   Radiology Dg Chest 2 View  Result Date: 12/28/2017 CLINICAL DATA:  Chest pain EXAM: CHEST - 2 VIEW COMPARISON:  04/26/2016 FINDINGS: No significant pleural effusion. Left-sided pacing device as before. Mild cardiomegaly with aortic atherosclerosis. Linear atelectasis at the left base. IMPRESSION: No active cardiopulmonary disease. Mild cardiomegaly with linear atelectasis at the left base Electronically Signed   By: Donavan Foil M.D.   On: 12/28/2017 03:02    Procedures Procedures  Medications Ordered in ED Medications  ondansetron (ZOFRAN-ODT) disintegrating tablet 4 mg (has no administration in time range)  acetaminophen (TYLENOL) tablet 650 mg (has no administration in time range)  nitroGLYCERIN (NITROSTAT) SL tablet 0.4 mg (0.4 mg Sublingual Given 12/28/17 0410)     Initial Impression / Assessment and Plan / ED Course  I have reviewed the triage vital signs and the nursing notes.  Pertinent labs & imaging results that were available during my care of the patient were reviewed by me and considered in my medical  decision making (see chart for details).     3:27 AM Patient reports continued pain though it is improved.  Will give nitroglycerin.  She has already received aspirin.  Will interrogate her ICD.  She will need to be admitted for further cardiac work-up. 4:15 AM Patient reports chest pressure is improved after nitroglycerin.  Due to high risk nature of complaint, and her comorbidities, will admit for work-up We are awaiting her ICD interrogation.  Will admit to the hospitalist. 4:22 AM Discussed with Dr. Darrick Meigs for admission Final Clinical Impressions(s) / ED Diagnoses   Final diagnoses:  Unstable angina [facility])    ED Discharge Orders    None       Ripley Fraise, MD 12/28/17 0422

## 2017-12-28 NOTE — Consult Note (Addendum)
Cardiology Consult    Patient ID: Melanie Cordova; [id number]; 1960/06/22   Admit date: 12/28/2017 Date of Consult: 12/28/2017  Primary Care Provider: Rosita Fire, MD Primary Cardiologist: [name], MD   Patient Profile    Melanie Cordova is a 57 y.o. female with past medical history of presumed nonischemic cardiomyopathy (EF 20% in 2012 with low-risk NST --> St. Jude ICD implantation by Dr. Lovena Le in 09/2011), chronic combined systolic and diastolic CHF (EF 35-36% by echo in 2015), HTN, HLD, COPD and prior CVA (ILR in place) who is being seen today for the evaluation of chest pain at the request of Dr. Darrick Meigs.   History of Present Illness    Melanie Cordova was last evaluated by Dr. Lovena Le in 08/2017 and denied any recent chest pain or dyspnea on exertion at that time.  Her ICD was interrogated and 10/2017 and was working appropriately. LINQ was interrogated last month and showed no significant arrhythmias or pauses noted.  She presented to [facility] ED earlier this morning for evaluation of chest discomfort which radiated up to her jaw. In talking with the patient today, she reports having episodes of chest discomfort occurring 2 days prior which would typically occur at rest but she reports being more active that day and cleaning her house and going for an afternoon walk. Denies any symptoms while walking. Starting yesterday evening, she developed a discomfort along her chest which she describes as someone "kicking her chest". The pain radiated into her jaw bilaterally and she reports having a significant headache at that time. She denies any recent orthopnea, PND, or lower extremity edema. Unaware of any recent ICD firings.  Her device was interrogated in the ED and showed no significant events. Labs showed WBC 11.2, Hgb 13.3, platelets 240, Na+ 137, K+ 3.5, creatinine 0.85. EKG shows NSR, HR 73, with PVC's and ST abnormality along lateral leads. Initial and delta troponin values  have been negative with cyclic enzymes pending. CXR with no active cardiopulmonary disease.   Past Medical History:  Diagnosis Date  . Anxiety   . Arthritis   . Asthma   . Automatic implantable cardioverter-defibrillator in situ    2013, july  . Bell palsy    states has had 3 episodes  . Cardiomyopathy 08/2010   Presented with congestive heart failure; EF of 15% and 2012; hypotension on medication precludes optimal dosing  . CHF (congestive heart failure) ([place])   . Chronic systolic heart failure ([place])   . COPD (chronic obstructive pulmonary disease) ([place])    2013  . Dysrhythmia   . Gastroesophageal reflux disease   . Headache(784.0)   . Hot flashes 10/04/2016  . Hyperlipidemia   . Hypertension    09/2010-normal CMet and CBC; Lipid profile-116, 88, 25, 73  . Hypothyroidism    Recent TSH was normal.  . 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 Neuromuscular disorder (HCC)    Neuropathy, right foot and leg  . Neuropathy   . Obesity   . Pneumonia   . Seizures ([place])    last one at age 68  . Shortness of breath   . Stroke [facility])     stroke in 07/2012 and another in June, 2014  . Tobacco abuse    20 pack years    Past Surgical History:  Procedure Laterality Date  . CESAREAN SECTION     X2  . COLONOSCOPY N/A 12/16/2013   Procedure: COLONOSCOPY;  Surgeon: Danie Binder, MD;  Location: [facility];  Service: Endoscopy;  Laterality: N/A;  10:45-moved to 10/5 @ [facility] notified pt  . EP IMPLANTABLE DEVICE     St. Jude  . EP IMPLANTABLE DEVICE N/A 08/13/2015   Procedure: Loop Recorder Insertion;  Surgeon: Evans Lance, MD;  Location: [place] CV LAB;  Service: Cardiovascular;  Laterality: N/A;  . IMPLANTABLE CARDIOVERTER DEFIBRILLATOR IMPLANT N/A 10/10/2011   Procedure: IMPLANTABLE CARDIOVERTER DEFIBRILLATOR IMPLANT;  Surgeon: Evans Lance, MD;  Location: [facility] CATH LAB;  Service: Cardiovascular;  Laterality: N/A;  . MULTIPLE EXTRACTIONS WITH  ALVEOLOPLASTY N/A 09/24/2012   Procedure: MULTIPLE EXTRACION #2, 4, 6, 7 ,8, 9, 11, 13, 18, 20, 21, 22, 23, 24, 25, 26, 27, 29 WITH ALVEOLOPLASTY, BIOPSY OF PALATE LESION, REMOVA RIGHT LINGUAL TORUS;  Surgeon: Gae Bon, DDS;  Location: [place];  Service: Oral Surgery;  Laterality: N/A;  . TEE WITHOUT CARDIOVERSION N/A 07/11/2012   Procedure: TRANSESOPHAGEAL ECHOCARDIOGRAM (TEE);  Surgeon: Thayer Headings, MD;  Location: [place];  Service: Cardiovascular;  Laterality: N/A;  . TUBAL LIGATION       Home Medications:  Prior to Admission medications   Medication Sig Start Date End Date Taking? Authorizing Provider  albuterol (PROVENTIL HFA;VENTOLIN HFA) 108 (90 BASE) MCG/ACT inhaler Inhale 2 puffs into the lungs every 6 (six) hours as needed for wheezing or shortness of breath. 07/12/12  Yes Ziemer, Kelli Hope, MD  aspirin EC 81 MG tablet Take 81 mg by mouth 2 (two) times daily.    Yes [provider]  budesonide-formoterol (SYMBICORT) 80-4.5 MCG/ACT inhaler Inhale 2 puffs into the lungs 2 (two) times daily.   Yes [provider]  carvedilol (COREG) 12.5 MG tablet Take 25 mg by mouth 2 (two) times daily with a meal.   Yes [provider]  cetirizine (ZYRTEC) 10 MG tablet Take 10 mg by mouth daily.   Yes [provider]  Cholecalciferol (VITAMIN D3) 5000 units CAPS TAKE ONE CAPSULE BY MOUTH DAILY 09/22/17  Yes Nida, Marella Chimes, MD  clobetasol (TEMOVATE) 0.05 % external solution Apply 1 application topically 2 (two) times daily as needed (Apply to scalp.).  07/24/15  Yes [provider]  diclofenac sodium (VOLTAREN) 1 % GEL Apply 2 g topically 4 (four) times daily as needed (pain).    Yes [provider]  Ferrous Sulfate (IRON) 28 MG TABS Take 1 tablet by mouth daily.   Yes [provider]  fluticasone (FLONASE) 50 MCG/ACT nasal spray Place 2 sprays into both nostrils daily as needed for allergies.    Yes [provider]    furosemide (LASIX) 40 MG tablet Take 1 tablet (40 mg total) by mouth 2 (two) times daily. 08/29/12  Yes Rothbart, Cristopher Estimable, MD  gabapentin (NEURONTIN) 800 MG tablet Take 800 mg by mouth 4 (four) times daily. 10/30/15  Yes [provider]  ketoconazole (NIZORAL) 2 % cream Apply 1 application topically 2 (two) times daily.  10/21/15  Yes [provider]  levothyroxine (SYNTHROID, LEVOTHROID) 200 MCG tablet Take 1 tablet (200 mcg total) by mouth daily before breakfast. Takes with 67mcg for a total of 236mcg 08/31/17  Yes Nida, Marella Chimes, MD  lisinopril (PRINIVIL,ZESTRIL) 10 MG tablet Take 1 tablet (10 mg total) by mouth daily. 08/29/12  Yes RothbartCristopher Estimable, MD  Multiple Vitamins-Minerals (CENTRAVITES 50 PLUS PO) Take by mouth daily.   Yes [provider]  ranitidine (ZANTAC) 150 MG tablet Take 150 mg by mouth at  bedtime.   Yes [provider]  rosuvastatin (CRESTOR) 20 MG tablet Take 20 mg by mouth daily.   Yes [provider]  Simethicone (GAS-X PO) Take by mouth as needed.   Yes [provider]    Inpatient Medications: Scheduled Meds: . acetaminophen  650 mg Oral Once  . aspirin EC  81 mg Oral BID  . carvedilol  25 mg Oral BID WC  . cholecalciferol  5,000 Units Oral Daily  . enoxaparin (LOVENOX) injection  40 mg Subcutaneous Q24H  . famotidine  20 mg Oral Daily  . ferrous sulfate  325 mg Oral Daily  . furosemide  40 mg Oral BID  . gabapentin  800 mg Oral QID  . levothyroxine  200 mcg Oral QAC breakfast  . lisinopril  10 mg Oral Daily  . loratadine  10 mg Oral Daily  . mometasone-formoterol  2 puff Inhalation BID  . rosuvastatin  20 mg Oral Daily  . sodium chloride flush  3 mL Intravenous Q12H   Continuous Infusions: . sodium chloride     PRN Meds: sodium chloride, acetaminophen **OR** acetaminophen, albuterol, alum & mag hydroxide-simeth, ondansetron **OR** ondansetron (ZOFRAN) IV, sodium chloride flush  Allergies:     Allergies  Allergen Reactions  . Fish Allergy Anaphylaxis  . Iodinated Diagnostic Agents Hives and Other (See Comments)    Pulmonary problems; no frank respiratory arrest  . Iodine Hives and Other (See Comments)    Pulmonary Problems   . Lentil Anaphylaxis  . Penicillins Anaphylaxis and Shortness Of Breath    Has patient had a PCN reaction causing immediate rash, facial/tongue/throat swelling, SOB or lightheadedness with hypotension: Yes Has patient had a PCN reaction causing severe rash involving mucus membranes or skin necrosis: No Has patient had a PCN reaction that required hospitalization No Has patient had a PCN reaction occurring within the last 10 years: No If all of the above answers are "NO", then may proceed with Cephalosporin use.  Hair loss  . Lipitor [Atorvastatin] Itching and Rash  . Spironolactone Rash  . Sulfa Antibiotics Other (See Comments)    Unknown    Social History:   Social History   Socioeconomic History  . Marital status: Divorced    Spouse name: Not on file  . Number of children: 2  . Years of education: Not on file  . Highest education level: Not on file  Occupational History    Employer: [employer]    Comment: Works in Environmental consultant  Social Needs  . Financial resource strain: Not on file  . Food insecurity:    Worry: Not on file    Inability: Not on file  . Transportation needs:    Medical: Not on file    Non-medical: Not on file  Tobacco Use  . Smoking status: Current Every Day Smoker    Packs/day: 0.50    Years: 21.00    Pack years: 10.50    Types: Cigarettes    Start date: 07/02/1983  . Smokeless tobacco: Never Used  . Tobacco comment: 5 cigs daily 11-26-12  Substance and Sexual Activity  . Alcohol use: No    Alcohol/week: 0.0 standard drinks  . Drug use: No  . Sexual activity: Not Currently    Birth control/protection: Abstinence, Surgical    Comment: tubal  Lifestyle  . Physical activity:    Days per week: Not on  file    Minutes per session: Not on file  . Stress: Not on file  Relationships  . Social connections:    Talks on phone: Not on file    Gets together: Not on file    Attends religious service: Not on file    Active member of club or organization: Not on file    Attends meetings of clubs or organizations: Not on file    Relationship status: Not on file  . Intimate partner violence:    Fear of current or ex partner: Not on file    Emotionally abused: Not on file    Physically abused: Not on file    Forced sexual activity: Not on file  Other Topics Concern  . Not on file  Social History Narrative  . Not on file     Family History:    Family History  Problem Relation Age of Onset  . Cardiomyopathy Mother        ICD pacemaker-ischmic CM  . Heart failure Mother   . Hypertension Mother   . Diabetes Mother   . Thyroid disease Mother   . Cardiomyopathy Father        Deceased  . Coronary artery disease Father   . Heart failure Father   . Heart failure Brother   . Hypertension Brother   . Endometriosis Sister   . Heart disease Paternal Grandfather   . Heart disease Paternal Grandmother   . Heart disease Maternal Grandmother   . Heart disease Maternal Grandfather   . Hypertension Daughter   . Obesity Daughter   . Colon cancer Neg Hx   . Liver disease Neg Hx       Review of Systems    General:  No chills, fever, night sweats or weight changes.  Cardiovascular:  No dyspnea on exertion, edema, orthopnea, palpitations, paroxysmal nocturnal dyspnea. Positive for chest pain.  Dermatological: No rash, lesions/masses Respiratory: No cough, dyspnea Urologic: No hematuria, dysuria Abdominal:   No nausea, vomiting, diarrhea, bright red blood per rectum, melena, or hematemesis Neurologic:  No visual changes, wkns, changes in mental status. Positive for headaches.   All other systems reviewed and are otherwise negative except as noted above.  Physical Exam/Data    Vitals:    12/28/17 0815 12/28/17 0830 12/28/17 0930 12/28/17 0957  BP:  127/73 108/72 115/82  Pulse: 62 70 69 69  Resp:  14  16  Temp:    97.6 F (36.4 C)  TempSrc:    Oral  SpO2:  100%  100%   No intake or output data in the 24 hours ending 12/28/17 1033 There were no vitals filed for this visit. There is no height or weight on file to calculate BMI.   General: Pleasant, Caucasian female appearing in NAD Psych: Normal affect. Neuro: Alert and oriented X 3. Moves all extremities spontaneously. HEENT: Normal  Neck: Supple without bruits or JVD. Lungs:  Resp regular and unlabored, CTA without wheezing or rales. Heart: RRR no s3, s4, or murmurs. Abdomen: Soft, non-tender, non-distended, BS + x 4.  Extremities: No clubbing, cyanosis or lower extremity edema. DP/PT/Radials 2+ and equal bilaterally.   EKG:  The EKG was personally reviewed and demonstrates:  NSR, HR 73, with PVC's and ST abnormality along lateral leads.   Labs/Studies     Relevant CV Studies:  NST: 08/2015  There was no ST segment deviation noted during stress.  Findings consistent with prior myocardial infarction in the lateral, inferolateral, and apical wall. There is no current ischemia.  This is an intermediate to high risk study based on LVEF  of 35%. There is no current significant myocardium currently at jeopardy.  The left ventricular ejection fraction is moderately decreased (30-44%).  Echocardiogram: 2015 Study Conclusions  - Left ventricle: The cavity size was severely dilated. There was mild concentric hypertrophy. Systolic function was moderately to severely reduced. The estimated ejection fraction was in the range of 30% to 35%. Regional wall motion abnormalities cannot be excluded. Doppler parameters are consistent with abnormal left ventricular relaxation (grade 1 diastolic dysfunction). There was no evidence of elevated ventricular filling pressure by Doppler parameters. - Aortic  valve: Transvalvular velocity was within the normal range. There was no stenosis. No regurgitation. - Aortic root: The aortic root was normal in size. - Mitral valve: Calcified annulus. No regurgitation. - Left atrium: The atrium was mildly dilated. - Right ventricle: The cavity size was normal. Wall thickness was normal. Systolic function was mildly reduced. - Tricuspid valve: No regurgitation. - Pulmonic valve: No regurgitation. - Systemic veins: IVC is not visualized. - Inferior vena cava: The vessel was normal in size. - Pericardium, extracardiac: A trivial pericardial effusion was identified.  Laboratory Data:  Chemistry Recent Labs  Lab 12/28/17 0245  NA 137  K 3.5  CL 102  CO2 28  GLUCOSE 104*  BUN 14  CREATININE 0.85  CALCIUM 8.6*  GFRNONAA >60  GFRAA >60  ANIONGAP 7    No results for input(s): PROT, ALBUMIN, AST, ALT, ALKPHOS, BILITOT in the last 168 hours. Hematology Recent Labs  Lab 12/28/17 0245  WBC 11.2*  RBC 4.54  HGB 13.3  HCT 42.6  MCV 93.8  MCH 29.3  MCHC 31.2  RDW 14.6  PLT 240   Cardiac Enzymes Recent Labs  Lab 12/28/17 0245 12/28/17 0555  TROPONINI <0.03 <0.03   No results for input(s): TROPIPOC in the last 168 hours.  BNPNo results for input(s): BNP, PROBNP in the last 168 hours.  DDimer No results for input(s): DDIMER in the last 168 hours.  Radiology/Studies:  Dg Chest 2 View  Result Date: 12/28/2017 CLINICAL DATA:  Chest pain EXAM: CHEST - 2 VIEW COMPARISON:  04/26/2016 FINDINGS: No significant pleural effusion. Left-sided pacing device as before. Mild cardiomegaly with aortic atherosclerosis. Linear atelectasis at the left base. IMPRESSION: No active cardiopulmonary disease. Mild cardiomegaly with linear atelectasis at the left base Electronically Signed   By: Donavan Foil M.D.   On: 12/28/2017 03:02     Assessment & Plan    1. Chest Pain with Known Cardiomyopathy - Presented with episodes of chest discomfort  occurring over the past 2 days which have occurred mostly at rest and her symptoms last night lasted for several hours but did improve with the use of sublingual nitroglycerin. EKG shows NSR, HR 73, with PVC's and ST abnormality along lateral leads. Initial and delta troponin values have been negative with cyclic enzymes pending.  - Will continue to cycle cardiac enzymes. Her most recent ischemic evaluation was a NST in 2017 which showed a scar but no significant ischemia. If enzymes trend upwards, she would require a cardiac catheterization for definitive evaluation. If EF is further declined as compared to prior studies, would also consider a catheterization as she has never underwent invasive evaluation since her diagnosis in 2012. If EF improved and enzymes remain negative, repeat NST could be pursued. - continue ASA, BB, and statin therapy.  2. Chronic Combined Systolic and Diastolic CHF - EF 40-97% by echo in 2015. She denies any recent dyspnea on exertion, orthopnea, PND, or lower extremity edema.  -  repeat echocardiogram is pending to assess for structural abnormalities.  - currently on Coreg 25mg  BID and Lisinopril 10mg  daily. If EF remains reduced, would plan to switch Lisinopril to Entresto following wash-out period.   3. HTN - BP has been well controlled at 106/67 - 127/73 since admission. Continue current medication regimen.  4. HLD - Followed by PCP. Remains on Crestor 20 mg daily. Will recheck FLP.   For questions or updates, please contact [place] Please consult [email or web address] for contact info under Cardiology/STEMI.  Signed, Erma Heritage, PA-C 12/28/2017, 10:33 AM Pager: [phone]   Patient seen and discussed with PA Ahmed Prima, I agree with her documentation. 57 yo female history of chronic systyolic HF, ICD placement,  HTN, COPD, stroke and TIA, admitted with chest pain  She reports symptoms started last night around midnight while watching TV. Severe  soreness midchest 10/10 with some dizziness and nausea. Constant pain nearly 3 hrs, some improvement after NG x 3 in ER but not immediate. Similar to the pain she has had over the years including in 2017 when she had a nuclear stress test with prior infarct but no current ischemia, though first episode of pain in some time.    04/2013 echo LVEF 26-71%, grade I diastolic dysfunction 04/4578 nuclear stress: lateral, inferolateral,apical scar. No current ischemia. ER ICD interrogtation: no events   K 3.5 Cr 0.85 WBC 11.2 Plt 240  Trop neg x 2 CXR no acute process EKG SR, chronic lateral ST/T changes  Patient presents with somewhat atypical chest pain, primarily in its duration of 3 hours. Thus far no objective evidence of ischemia however she has not completed her rule out as of yet. We will follow additional troponins, follow up echo. Long history of chronic systolic HF, from my record review I cannot see she has ever had a cath. Her stress test 08/2015 though no ischemia did suggest scar, suggesting a possible ischemic CM. May need reconsideration for cath at some point (NOTE DYE ALLERGY, WOULD REQUIRE PREP), particularly if there is any decrease in her LVEF. Continue medical therapy with ASA, coreg 25mg  bid, lisinopril 10, crestor 20. She has aldactone allergy. If persistent decreased LVEF would change to entresto after 48hrs off lisinopril. Make npo tonight at midnight in case further testing is indicated.     Carlyle Dolly MD

## 2017-12-28 NOTE — ED Notes (Signed)
Attempted report x 1. RN in rapid response at this time. Will call back when available per secretary.

## 2017-12-29 ENCOUNTER — Observation Stay (HOSPITAL_BASED_OUTPATIENT_CLINIC_OR_DEPARTMENT_OTHER): Payer: Medicare Other

## 2017-12-29 DIAGNOSIS — I5033 Acute on chronic diastolic (congestive) heart failure: Secondary | ICD-10-CM

## 2017-12-29 DIAGNOSIS — R079 Chest pain, unspecified: Secondary | ICD-10-CM | POA: Diagnosis not present

## 2017-12-29 LAB — NM MYOCAR MULTI W/SPECT W/WALL MOTION / EF
CHL CUP NUCLEAR SRS: 8
CHL CUP NUCLEAR SSS: 11
CHL CUP RESTING HR STRESS: 76 {beats}/min
LV dias vol: 174 mL (ref 46–106)
LV sys vol: 110 mL
NUC STRESS TID: 0.9
Peak HR: 103 {beats}/min
RATE: 0.5
SDS: 3

## 2017-12-29 LAB — COMPREHENSIVE METABOLIC PANEL
ALBUMIN: 3.5 g/dL (ref 3.5–5.0)
ALT: 16 U/L (ref 0–44)
ANION GAP: 9 (ref 5–15)
AST: 17 U/L (ref 15–41)
Alkaline Phosphatase: 77 U/L (ref 38–126)
BUN: 11 mg/dL (ref 6–20)
CHLORIDE: 106 mmol/L (ref 98–111)
CO2: 26 mmol/L (ref 22–32)
Calcium: 8.7 mg/dL — ABNORMAL LOW (ref 8.9–10.3)
Creatinine, Ser: 0.79 mg/dL (ref 0.44–1.00)
GFR calc non Af Amer: >60 mL/min (ref >60)
GLUCOSE: 91 mg/dL (ref 70–99)
POTASSIUM: 3.6 mmol/L (ref 3.5–5.1)
SODIUM: 141 mmol/L (ref 135–145)
Total Bilirubin: 0.5 mg/dL (ref 0.3–1.2)
Total Protein: 6.5 g/dL (ref 6.5–8.1)

## 2017-12-29 LAB — CBC
HCT: 43.6 % (ref 36.0–46.0)
HEMOGLOBIN: 13.4 g/dL (ref 12.0–15.0)
MCH: 28.3 pg (ref 26.0–34.0)
MCHC: 30.7 g/dL (ref 30.0–36.0)
MCV: 92.2 fL (ref 80.0–100.0)
PLATELETS: 248 10*3/uL (ref 150–400)
RBC: 4.73 MIL/uL (ref 3.87–5.11)
RDW: 14.7 % (ref 11.5–15.5)
WBC: 9.8 10*3/uL (ref 4.0–10.5)
nRBC: 0 % (ref 0.0–0.2)

## 2017-12-29 LAB — LIPID PANEL
CHOL/HDL RATIO: 4.3 ratio
Cholesterol: 145 mg/dL (ref 0–200)
HDL: 34 mg/dL — AB (ref >40)
LDL CALC: 72 mg/dL (ref 0–99)
Triglycerides: 196 mg/dL — ABNORMAL HIGH (ref <150)
VLDL: 39 mg/dL (ref 0–40)

## 2017-12-29 LAB — HIV ANTIBODY (ROUTINE TESTING W REFLEX): HIV SCREEN 4TH GENERATION: NONREACTIVE

## 2017-12-29 LAB — MAGNESIUM: MAGNESIUM: 2.1 mg/dL (ref 1.7–2.4)

## 2017-12-29 MED ORDER — POTASSIUM CHLORIDE CRYS ER 20 MEQ PO TBCR
20.0000 meq | EXTENDED_RELEASE_TABLET | Freq: Two times a day (BID) | ORAL | Status: DC
  Administered 2017-12-29: 20 meq via ORAL
  Filled 2017-12-29: qty 1

## 2017-12-29 MED ORDER — TECHNETIUM TC 99M TETROFOSMIN IV KIT
10.0000 | PACK | Freq: Once | INTRAVENOUS | Status: AC | PRN
  Administered 2017-12-29: 10.53 via INTRAVENOUS

## 2017-12-29 MED ORDER — ISOSORBIDE MONONITRATE ER 30 MG PO TB24: 15.0000 mg | ORAL_TABLET | Freq: Every day | ORAL | Status: DC

## 2017-12-29 MED ORDER — REGADENOSON 0.4 MG/5ML IV SOLN
INTRAVENOUS | Status: AC
  Administered 2017-12-29: 0.08 mg via INTRAVENOUS
  Filled 2017-12-29: qty 5

## 2017-12-29 MED ORDER — TECHNETIUM TC 99M TETROFOSMIN IV KIT
30.0000 | PACK | Freq: Once | INTRAVENOUS | Status: AC | PRN
  Administered 2017-12-29: 28.5 via INTRAVENOUS

## 2017-12-29 MED ORDER — SODIUM CHLORIDE 0.9% FLUSH
INTRAVENOUS | Status: AC
  Administered 2017-12-29: 13:00:00 via INTRAVENOUS
  Filled 2017-12-29: qty 10

## 2017-12-29 NOTE — Discharge Summary (Signed)
Physician Discharge Summary  GWYNNE KEMNITZ UXL:[id number] DOB: 1960/09/25 DOA: 12/28/2017  PCP: Rosita Fire, MD  Admit date: 12/28/2017  Discharge date: 12/29/2017  Admitted From: Home  Disposition:  Home  Discharge Condition:Stable  CODE STATUS:  Full  Diet recommendation:  Heart Healthy   Brief/Interim Summary:  [place] a36 y.o.female,with history of COPD, chronic systolic CHF status post ICD placement presented to ED with complaints of chest pain for one day.  Patient stated that she felt like a sharp/shock in her chest which was followed by continuous chest pain as if something sitting on her chest. The pain got relieved after patient received nitroglycerin in the ED.   Patient was then admitted to the hospital for further evaluation.  Troponins were negative.  EKG was nonischemic.  Patient had a 2D echocardiogram with ejection fraction of 35 to 40%.  She also had a myocardial perfusion scan which showed prior evidence of infarction but no current ischemia.  She was seen by cardiology.  Cardiology recommended continuation of current medications and no further intervention.  Patient was advised to follow-up with her primary cardiologist as outpatient.  She was also advised to follow-up with her primary care physician in 1 week.  Discharge Diagnoses:  Active Problems:   Chest pain, likely atypical and musculoskeletal.   Discharge Instructions  Discharge Instructions    Diet - low sodium heart healthy   Complete by:  As directed    Discharge instructions   Complete by:  As directed    Please follow-up with cardiology as outpatient.  Please follow-up with your primary care physician in 1 week.   Increase activity slowly   Complete by:  As directed      Allergies as of 12/29/2017      Reactions   Fish Allergy Anaphylaxis   Iodinated Diagnostic Agents Hives, Other (See Comments)   Pulmonary problems; no frank respiratory arrest   Iodine Hives, Other  (See Comments)   Pulmonary Problems    Lentil Anaphylaxis   Penicillins Anaphylaxis, Shortness Of Breath   Has patient had a PCN reaction causing immediate rash, facial/tongue/throat swelling, SOB or lightheadedness with hypotension: Yes Has patient had a PCN reaction causing severe rash involving mucus membranes or skin necrosis: No Has patient had a PCN reaction that required hospitalization No Has patient had a PCN reaction occurring within the last 10 years: No If all of the above answers are "NO", then may proceed with Cephalosporin use. Hair loss   Lipitor [atorvastatin] Itching, Rash   Spironolactone Rash   Sulfa Antibiotics Other (See Comments)   Unknown      Medication List    STOP taking these medications   metroNIDAZOLE 0.75 % vaginal gel Commonly known as:  METROGEL   potassium chloride SA 20 MEQ tablet Commonly known as:  K-DUR,KLOR-CON     TAKE these medications   albuterol 108 (90 Base) MCG/ACT inhaler Commonly known as:  PROVENTIL HFA;VENTOLIN HFA Inhale 2 puffs into the lungs every 6 (six) hours as needed for wheezing or shortness of breath.   aspirin EC 81 MG tablet Take 81 mg by mouth 2 (two) times daily.   budesonide-formoterol 80-4.5 MCG/ACT inhaler Commonly known as:  SYMBICORT Inhale 2 puffs into the lungs 2 (two) times daily.   carvedilol 12.5 MG tablet Commonly known as:  COREG Take 25 mg by mouth 2 (two) times daily with a meal.   CENTRAVITES 50 PLUS PO Take by mouth daily.   cetirizine 10 MG tablet Commonly  known as:  ZYRTEC Take 10 mg by mouth daily.   clobetasol 0.05 % external solution Commonly known as:  TEMOVATE Apply 1 application topically 2 (two) times daily as needed (Apply to scalp.).   diclofenac sodium 1 % Gel Commonly known as:  VOLTAREN Apply 2 g topically 4 (four) times daily as needed (pain).   fluticasone 50 MCG/ACT nasal spray Commonly known as:  FLONASE Place 2 sprays into both nostrils daily as needed for  allergies.   furosemide 40 MG tablet Commonly known as:  LASIX Take 1 tablet (40 mg total) by mouth 2 (two) times daily.   gabapentin 800 MG tablet Commonly known as:  NEURONTIN Take 800 mg by mouth 4 (four) times daily.   GAS-X PO Take by mouth as needed.   Iron 28 MG Tabs Take 1 tablet by mouth daily.   ketoconazole 2 % cream Commonly known as:  NIZORAL Apply 1 application topically 2 (two) times daily.   levothyroxine 200 MCG tablet Commonly known as:  SYNTHROID, LEVOTHROID Take 1 tablet (200 mcg total) by mouth daily before breakfast. Takes with 50mcg for a total of 269mcg   lisinopril 10 MG tablet Commonly known as:  PRINIVIL,ZESTRIL Take 1 tablet (10 mg total) by mouth daily.   ranitidine 150 MG tablet Commonly known as:  ZANTAC Take 150 mg by mouth at bedtime.   rosuvastatin 20 MG tablet Commonly known as:  CRESTOR Take 20 mg by mouth daily.   Vitamin D3 5000 units Caps TAKE ONE CAPSULE BY MOUTH DAILY      Follow-up Information    Rosita Fire, MD. Schedule an appointment as soon as possible for a visit in 1 week(s).   Specialty:  Internal Medicine Contact information: [place] [address] [phone]        Evans Lance, MD .   Specialty:  Cardiology Contact information: [place] [address] [phone]          Allergies  Allergen Reactions  . Fish Allergy Anaphylaxis  . Iodinated Diagnostic Agents Hives and Other (See Comments)    Pulmonary problems; no frank respiratory arrest  . Iodine Hives and Other (See Comments)    Pulmonary Problems   . Lentil Anaphylaxis  . Penicillins Anaphylaxis and Shortness Of Breath    Has patient had a PCN reaction causing immediate rash, facial/tongue/throat swelling, SOB or lightheadedness with hypotension: Yes Has patient had a PCN reaction causing severe rash involving mucus membranes or skin necrosis: No Has patient had a PCN reaction that required  hospitalization No Has patient had a PCN reaction occurring within the last 10 years: No If all of the above answers are "NO", then may proceed with Cephalosporin use.  Hair loss  . Lipitor [Atorvastatin] Itching and Rash  . Spironolactone Rash  . Sulfa Antibiotics Other (See Comments)    Unknown    Consultations:  Cardiology   Procedures/Studies:  Dg Chest 2 View  Result Date: 12/28/2017 CLINICAL DATA:  Chest pain EXAM: CHEST - 2 VIEW COMPARISON:  04/26/2016 FINDINGS: No significant pleural effusion. Left-sided pacing device as before. Mild cardiomegaly with aortic atherosclerosis. Linear atelectasis at the left base. IMPRESSION: No active cardiopulmonary disease. Mild cardiomegaly with linear atelectasis at the left base Electronically Signed   By: Donavan Foil M.D.   On: 12/28/2017 03:02   Nm Myocar Multi W/spect W/wall Motion / Ef  Result Date: 12/29/2017  There was no ST segment deviation noted during stress.  Findings consistent with prior apical, basal septal, and inferolateral myocardial infarction. There is no current ishemia.  This is a high risk study. Risk based on decreased LVEF, there is no myocardium currently at jeopardy.  The left ventricular ejection fraction is moderately decreased (30-44%).       Subjective: Patient denies any chest pain, shortness of breath or dyspnea.  She voices to go home  Discharge Exam: Vitals:   12/29/17 1005 12/29/17 1333  BP: 109/66 117/69  Pulse:  85  Resp:  18  Temp:  98.3 F (36.8 C)  SpO2:  94%   Vitals:   12/29/17 0536 12/29/17 1001 12/29/17 1005 12/29/17 1333  BP: 133/84 109/66 109/66 117/69  Pulse: 90 80  85  Resp: 18   18  Temp: 98.8 F (37.1 C)   98.3 F (36.8 C)  TempSrc: Oral   Oral  SpO2: 95%   94%   General: Pt is alert, awake, not in acute distress Cardiovascular: RRR, S1/S2 +, no rubs, no gallops.  Left chest wall AICD in place. Respiratory: CTA bilaterally, no wheezing, no rhonchi Abdominal:  Soft, NT, ND, bowel sounds + CNS: non focal. Extremities: no edema, no cyanosis  The results of significant diagnostics from this hospitalization (including imaging, microbiology, ancillary and laboratory) are listed below for reference.    Microbiology: No results found for this or any previous visit (from the past 240 hour(s)).   Labs: BNP (last 3 results) No results for input(s): BNP in the last 8760 hours. Basic Metabolic Panel: Recent Labs  Lab 12/28/17 0245 12/29/17 0442  NA 137 141  K 3.5 3.6  CL 102 106  CO2 28 26  GLUCOSE 104* 91  BUN 14 11  CREATININE 0.85 0.79  CALCIUM 8.6* 8.7*  MG  --  2.1   Liver Function Tests: Recent Labs  Lab 12/29/17 0442  AST 17  ALT 16  ALKPHOS 77  BILITOT 0.5  PROT 6.5  ALBUMIN 3.5   No results for input(s): LIPASE, AMYLASE in the last 168 hours. No results for input(s): AMMONIA in the last 168 hours. CBC: Recent Labs  Lab 12/28/17 0245 12/29/17 0442  WBC 11.2* 9.8  HGB 13.3 13.4  HCT 42.6 43.6  MCV 93.8 92.2  PLT 240 248   Cardiac Enzymes: Recent Labs  Lab 12/28/17 0245 12/28/17 0555 12/28/17 1206 12/28/17 1731  TROPONINI <0.03 <0.03 <0.03 <0.03   BNP: Invalid input(s): POCBNP CBG: No results for input(s): GLUCAP in the last 168 hours. D-Dimer No results for input(s): DDIMER in the last 72 hours. Hgb A1c No results for input(s): HGBA1C in the last 72 hours. Lipid Profile Recent Labs    12/29/17 0442  CHOL 145  HDL 34*  LDLCALC 72  TRIG 196*  CHOLHDL 4.3   Thyroid function studies No results for input(s): TSH, T4TOTAL, T3FREE, THYROIDAB in the last 72 hours.  Invalid input(s): FREET3 Anemia work up No results for input(s): VITAMINB12, FOLATE, FERRITIN, TIBC, IRON, RETICCTPCT in the last 72 hours. Urinalysis    Component Value Date/Time   COLORURINE YELLOW 07/26/2015 0300   APPEARANCEUR CLEAR 07/26/2015 0300   LABSPEC <1.005 (L) 07/26/2015 0300   PHURINE 5.0 07/26/2015 0300   GLUCOSEU  NEGATIVE 07/26/2015 0300   HGBUR NEGATIVE 07/26/2015 0300   BILIRUBINUR NEGATIVE 07/26/2015 0300   KETONESUR NEGATIVE 07/26/2015 0300   PROTEINUR NEGATIVE 07/26/2015 0300   UROBILINOGEN 0.2 04/29/2013 1702   NITRITE NEGATIVE 07/26/2015 0300   LEUKOCYTESUR TRACE (A) 07/26/2015 0300  Sepsis Labs Invalid input(s): PROCALCITONIN,  WBC,  LACTICIDVEN Microbiology No results found for this or any previous visit (from the past 240 hour(s)).  Please note: You were cared for by a hospitalist during your hospital stay. Once you are discharged, your primary care physician will handle any further medical issues.   Time coordinating discharge: 40 minutes  SIGNED:  Flora Lipps, MD  [facility] 12/29/2017, 3:02 PM

## 2017-12-29 NOTE — Progress Notes (Signed)
Informed by cardiologist that patient can be discharged. MD has been E-Paged.

## 2017-12-29 NOTE — Progress Notes (Signed)
Patient had 5 beat run VTach. Cardiology PA currently with patient at this moment and has been informed. Pt asymptomatic and denies chest pain or discomfort. Pt is being scheduled for Stress test this am. Will continue to monitor patient.

## 2017-12-29 NOTE — Progress Notes (Addendum)
Progress Note  Patient Name: Melanie Cordova Date of Encounter: 12/29/2017  Primary Cardiologist: [name], MD   Subjective   She denies any chest pain or dyspnea overnight. Feeling back to baseline. Has been NPO since midnight.   Inpatient Medications    Scheduled Meds: . acetaminophen  650 mg Oral Once  . aspirin EC  81 mg Oral BID  . carvedilol  25 mg Oral BID WC  . cholecalciferol  5,000 Units Oral Daily  . enoxaparin (LOVENOX) injection  40 mg Subcutaneous Q24H  . famotidine  20 mg Oral Daily  . ferrous sulfate  325 mg Oral Daily  . furosemide  40 mg Oral BID  . gabapentin  800 mg Oral QID  . levothyroxine  200 mcg Oral QAC breakfast  . lisinopril  10 mg Oral Daily  . loratadine  10 mg Oral Daily  . mometasone-formoterol  2 puff Inhalation BID  . rosuvastatin  20 mg Oral Daily  . sodium chloride flush  3 mL Intravenous Q12H   Continuous Infusions: . sodium chloride     PRN Meds: sodium chloride, acetaminophen **OR** acetaminophen, albuterol, alum & mag hydroxide-simeth, ondansetron **OR** ondansetron (ZOFRAN) IV, sodium chloride flush   Vital Signs    Vitals:   12/28/17 1408 12/28/17 1942 12/28/17 2118 12/29/17 0536  BP: 125/63  109/62 133/84  Pulse: 77  76 90  Resp: 20  20 18   Temp: 97.8 F (36.6 C)  98.2 F (36.8 C) 98.8 F (37.1 C)  TempSrc: Oral  Oral Oral  SpO2: 97% 96% 95% 95%    Intake/Output Summary (Last 24 hours) at 12/29/2017 0852 Last data filed at 12/29/2017 0600 Gross per 24 hour  Intake 960 ml  Output -  Net 960 ml   There were no vitals filed for this visit.  Telemetry    NSR, HR in 70's to 80's with occasional PVC's. 5 beats NSVT this AM.   - Personally Reviewed  ECG    No new tracings.   Physical Exam   General: Well developed,obese Caucasian female appearing in no acute distress. Head: Normocephalic, atraumatic.  Neck: Supple without bruits, JVD not elevated. Lungs:  Resp regular and unlabored, CTA without  wheezing or rales. Heart: RRR with occasional ectopic beats, S1, S2, no S3, S4, or murmur; no rub. Abdomen: Soft, non-tender, non-distended with normoactive bowel sounds. No hepatomegaly. No rebound/guarding. No obvious abdominal masses. Extremities: No clubbing, cyanosis, or lower extremity edema. Distal pedal pulses are 2+ bilaterally. Neuro: Alert and oriented X 3. Moves all extremities spontaneously. Psych: Normal affect.  Labs    Chemistry Recent Labs  Lab 12/28/17 0245 12/29/17 0442  NA 137 141  K 3.5 3.6  CL 102 106  CO2 28 26  GLUCOSE 104* 91  BUN 14 11  CREATININE 0.85 0.79  CALCIUM 8.6* 8.7*  PROT  --  6.5  ALBUMIN  --  3.5  AST  --  17  ALT  --  16  ALKPHOS  --  77  BILITOT  --  0.5  GFRNONAA >60 >60  GFRAA >60 >60  ANIONGAP 7 9     Hematology Recent Labs  Lab 12/28/17 0245 12/29/17 0442  WBC 11.2* 9.8  RBC 4.54 4.73  HGB 13.3 13.4  HCT 42.6 43.6  MCV 93.8 92.2  MCH 29.3 28.3  MCHC 31.2 30.7  RDW 14.6 14.7  PLT 240 248    Cardiac Enzymes Recent Labs  Lab 12/28/17 0245 12/28/17 0555 12/28/17 1206  12/28/17 1731  TROPONINI <0.03 <0.03 <0.03 <0.03   No results for input(s): TROPIPOC in the last 168 hours.   BNPNo results for input(s): BNP, PROBNP in the last 168 hours.   DDimer No results for input(s): DDIMER in the last 168 hours.   Radiology    Dg Chest 2 View  Result Date: 12/28/2017 CLINICAL DATA:  Chest pain EXAM: CHEST - 2 VIEW COMPARISON:  04/26/2016 FINDINGS: No significant pleural effusion. Left-sided pacing device as before. Mild cardiomegaly with aortic atherosclerosis. Linear atelectasis at the left base. IMPRESSION: No active cardiopulmonary disease. Mild cardiomegaly with linear atelectasis at the left base Electronically Signed   By: Donavan Foil M.D.   On: 12/28/2017 03:02    Cardiac Studies   Echocardiogram: 12/28/2017 Study Conclusions  - Left ventricle: The cavity size was moderately dilated. Wall   thickness was  increased in a pattern of moderate LVH. Systolic   function was moderately reduced. The estimated ejection fraction   was in the range of 35% to 40%. Diffuse hypokinesis. Doppler   parameters are consistent with abnormal left ventricular   relaxation (grade 1 diastolic dysfunction). - Aortic valve: Moderately calcified annulus. Trileaflet;   moderately thickened leaflets. Valve area (VTI): 1.59 cm^2. - Mitral valve: Moderately calcified annulus. Normal thickness   leaflets . - Atrial septum: No defect or patent foramen ovale was identified. - Technically adequate study.  Patient Profile     57 y.o. female w/ PMH of presumed nonischemic cardiomyopathy (EF 20% in 2012 with low-risk NST --> [employer] ICD implantation by Dr. 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   Assessment & Plan    1. Chest Pain with Known Cardiomyopathy - Presented with episodes of chest discomfort over the past 2 days, occurring at rest and lasting for hours, but resolving with SL NTG upon arrival to the ED.  - cyclic troponin values have been negative and EKG shows no acute ischemic changes. Repeat echo shows mild improvement in her EF as this was previously 30-35% by echo in 2015, at 35-40% currently with diffuse HK noted.  - most recent [place] in 08/2015 showed prior infarct with no current ischemia. Reviewed with Dr. Harl Bowie and will plan for repeat NST today as she has been NPO since midnight. Her weight was 282 lbs in 08/2017 and a repeat weight has not been obtained this admission. Will add daily weights. Verified with Nuclear Medicine she can still be a 1-day study as they can lengthen her imaging time.  - continue ASA, BB, and statin therapy.  2. Chronic Combined Systolic and Diastolic CHF - EF 47-65% by echo in 2015. She is s/p ICD  placement in 2013 which is followed by Dr. Lovena Le. Device interrogated at the time of admission and showed no recent events.  - She denies any recent dyspnea on exertion, orthopnea, PND, or lower extremity edema.  - currently on Coreg 25mg  BID and Lisinopril 10mg  daily. Continue Lasix at current dosing as she appears euvolemic on examination. Would recommend transitioning to [facility] given that her EF remains reduced but she would require a 36-hour wash-out period for Lisinopril.   3. HTN - BP has been well controlled at 108/62 - 133/84 within the past 24 hours. Continue current medication regimen.  4. HLD - Followed by PCP. Remains on  Crestor 20 mg daily. Will add on FLP to AM labs.   5. NSVT - had 5 beats NSVT this AM. K+ 3.6 as PTA K-dur 20 mEq BID was not ordered at the time of admission. Will re-order now. Add on Mg to AM labs.    For questions or updates, please contact [facility] Please consult [email or web address] for contact info under Cardiology/STEMI.   Arna Medici , PA-C 8:52 AM 12/29/2017 Pager: [phone]  Patient seen and discussed with PA Ahmed Prima, I agree with her documentation. 57 yo female with history of chronic systolic HF admitted with chest pain. Enzymes and EKGs have not been consistent with ACS. Echo shows overall stable LV function at 35-40%. Stress shows evidence of prior infarction without current ischemia. No further cardiac testing planned at this time. If recurrent episodes could consider emperic trial of imdur for possible coronary vasospasm.    Continue her home CHF regimen, defer consideration for changing to entresto to her primary cardiolgist at outpatient follow up. She will have PA follow up and then reestablish with Dr Bronson Ing who she has previously seen in clinic   Zandra Abts MD

## 2017-12-29 NOTE — Progress Notes (Signed)
Pt's IV catheter removed and intact. Pt's IV site clean dry and intact. Discharge instructions including medications and follow up appointments were reviewed and discussed with patient. All questions were answered and no further questions at this time. Pt in stable condition and in no acute distress at time of discharge. Pt will be escorted by nurse tech.  

## 2018-01-04 ENCOUNTER — Telehealth: Payer: Self-pay

## 2018-01-04 NOTE — Telephone Encounter (Signed)
Spoke w/ pt and requested that she send a manual transmission b/c her home monitor has not updated in at least 14 days.   

## 2018-01-08 LAB — CUP PACEART REMOTE DEVICE CHECK
MDC IDC LEAD IMPLANT DT: 20130729
MDC IDC LEAD LOCATION: 753860
MDC IDC LEAD SERIAL: 322336
MDC IDC PG IMPLANT DT: 20170601
MDC IDC SESS DTM: 20191014020548

## 2018-01-23 ENCOUNTER — Ambulatory Visit (INDEPENDENT_AMBULATORY_CARE_PROVIDER_SITE_OTHER): Payer: Medicare Other | Admitting: Student

## 2018-01-23 ENCOUNTER — Encounter: Payer: Self-pay | Admitting: Student

## 2018-01-23 VITALS — BP 132/70 | HR 78 | Ht 67.5 in | Wt 282.0 lb

## 2018-01-23 DIAGNOSIS — I739 Peripheral vascular disease, unspecified: Secondary | ICD-10-CM

## 2018-01-23 DIAGNOSIS — I5042 Chronic combined systolic (congestive) and diastolic (congestive) heart failure: Secondary | ICD-10-CM | POA: Diagnosis not present

## 2018-01-23 DIAGNOSIS — E785 Hyperlipidemia, unspecified: Secondary | ICD-10-CM

## 2018-01-23 DIAGNOSIS — I10 Essential (primary) hypertension: Secondary | ICD-10-CM | POA: Diagnosis not present

## 2018-01-23 DIAGNOSIS — Z79899 Other long term (current) drug therapy: Secondary | ICD-10-CM

## 2018-01-23 MED ORDER — SACUBITRIL-VALSARTAN 24-26 MG PO TABS: 1.0000 | ORAL_TABLET | Freq: Two times a day (BID) | ORAL | 11 refills | Status: DC

## 2018-01-23 NOTE — Progress Notes (Signed)
Cardiology Office Note    Date:  01/23/2018   ID:  ARANTXA PIERCEY, DOB 04-10-1960, MRN [id number]  PCP:  Rosita Fire, MD  Cardiologist: [name], MD    Chief Complaint  Patient presents with  . Hospitalization Follow-up    History of Present Illness:    Melanie Cordova is a 57 y.o. female with past medical history of presumed nonischemic cardiomyopathy (EF 20% in 2012 with low-risk NST --> St. Jude ICD implantation by Dr. Lovena Le in 09/2011), chronic combined systolic and diastolic CHF (EF 38-25% by echo in 2015), HTN, HLD, COPD and prior CVA (ILR in place) who presents to the office today for hospital follow-up.   She was recently admitted to [facility] on 12/28/2017 for evaluation of chest discomfort which it started earlier than the morning of admission.  She reported having episodes of chest pain 2 days prior which she described as someone "kicking her chest".  Her ICD was interrogated in the ED and did not show any significant events. Initial and cyclic troponin values remain negative and a repeat echocardiogram showed that her EF was slightly improved at 35 to 40%. Given her presenting symptoms, a Lexiscan Myoview was performed for ischemic evaluation. This showed findings consistent with prior apical, basal septal, inferior lateral infarction with no current ischemia. This study was read as high risk based off of her decreased EF but there was no myocardium currently in jeopardy. She was continued on her current medication regimen at that time with consideration of switching from Lisinopril to [facility] as an outpatient.  In talking with the patient today, she reports overall doing well from a cardiac perspective since her recent hospitalization. She denies any recurrent chest discomfort and says her respiratory status has been at baseline. No recent orthopnea, PND, lower extremity edema, or dyspnea on exertion. Denies any recent lightheadedness, dizziness, or  presyncope.  Her main concern today is that she has experienced pain along her lower extremities over the past several months which has continued to worsen. She initially describes this as a numbness along her lower extremities which occurs with sitting but then says she gets pain with ambulation. She was recently evaluated by Neurology but says no further testing was pursued at that time.  She has remained on Gabapentin and has not experienced any improvement in her symptoms despite recent dose titration.   Past Medical History:  Diagnosis Date  . Anxiety   . Arthritis   . Asthma   . Automatic implantable cardioverter-defibrillator in situ    2013, july  . Bell palsy    states has had 3 episodes  . Cardiomyopathy 08/2010   Presented with congestive heart failure; EF of 15% and 2012; hypotension on medication precludes optimal dosing  . CHF (congestive heart failure) ([place])    a. EF 20% in 2012 with low-risk NST --> St. Jude ICD implantation by Dr. Lovena Le in 09/2011  . Chronic systolic heart failure ([place])   . COPD (chronic obstructive pulmonary disease) ([place])    2013  . Dysrhythmia   . Gastroesophageal reflux disease   . Headache(784.0)   . Hot flashes 10/04/2016  . Hyperlipidemia   . Hypertension    09/2010-normal CMet and CBC; Lipid profile-116, 88, 25, 73  . Hypothyroidism    Recent TSH was normal.  . 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 Neuromuscular disorder (HCC)    Neuropathy, right foot and leg  . Neuropathy   . Obesity   .  Pneumonia   . Seizures ([place])    last one at age 37  . Shortness of breath   . Stroke [facility])     stroke in 07/2012 and another in June, 2014  . Tobacco abuse    20 pack years    Past Surgical History:  Procedure Laterality Date  . CESAREAN SECTION     X2  . COLONOSCOPY N/A 12/16/2013   Procedure: COLONOSCOPY;  Surgeon: Danie Binder, MD;  [facility];  Service: Endoscopy;  Laterality: N/A;  10:45-moved to 10/5  @ [facility] notified pt  . EP IMPLANTABLE DEVICE     St. Jude  . EP IMPLANTABLE DEVICE N/A 08/13/2015   Procedure: Loop Recorder Insertion;  Surgeon: Evans Lance, MD;  Location: [place] CV LAB;  Service: Cardiovascular;  Laterality: N/A;  . IMPLANTABLE CARDIOVERTER DEFIBRILLATOR IMPLANT N/A 10/10/2011   Procedure: IMPLANTABLE CARDIOVERTER DEFIBRILLATOR IMPLANT;  Surgeon: Evans Lance, MD;  Location: [facility] CATH LAB;  Service: Cardiovascular;  Laterality: N/A;  . MULTIPLE EXTRACTIONS WITH ALVEOLOPLASTY N/A 09/24/2012   Procedure: MULTIPLE EXTRACION #2, 4, 6, 7 ,8, 9, 11, 13, 18, 20, 21, 22, 23, 24, 25, 26, 27, 29 WITH ALVEOLOPLASTY, BIOPSY OF PALATE LESION, REMOVA RIGHT LINGUAL TORUS;  Surgeon: Gae Bon, DDS;  Location: [place];  Service: Oral Surgery;  Laterality: N/A;  . TEE WITHOUT CARDIOVERSION N/A 07/11/2012   Procedure: TRANSESOPHAGEAL ECHOCARDIOGRAM (TEE);  Surgeon: Thayer Headings, MD;  Location: [place];  Service: Cardiovascular;  Laterality: N/A;  . TUBAL LIGATION      Current Medications: Outpatient Medications Prior to Visit  Medication Sig Dispense Refill  . albuterol (PROVENTIL HFA;VENTOLIN HFA) 108 (90 BASE) MCG/ACT inhaler Inhale 2 puffs into the lungs every 6 (six) hours as needed for wheezing or shortness of breath. 1 Inhaler 5  . aspirin EC 81 MG tablet Take 81 mg by mouth 2 (two) times daily.     . budesonide-formoterol (SYMBICORT) 80-4.5 MCG/ACT inhaler Inhale 2 puffs into the lungs 2 (two) times daily.    . carvedilol (COREG) 12.5 MG tablet Take 25 mg by mouth 2 (two) times daily with a meal.    . cetirizine (ZYRTEC) 10 MG tablet Take 10 mg by mouth daily.    . Cholecalciferol (VITAMIN D3) 5000 units CAPS TAKE ONE CAPSULE BY MOUTH DAILY (Patient taking differently: Take 50,000 Units by mouth once a week. ) 90 capsule 0  . clobetasol (TEMOVATE) 0.05 % external solution Apply 1 application topically 2 (two) times daily as needed (Apply to scalp.).     Marland Kitchen  diclofenac sodium (VOLTAREN) 1 % GEL Apply 2 g topically 4 (four) times daily as needed (pain).     . Ferrous Sulfate (IRON) 28 MG TABS Take 1 tablet by mouth daily.    . fluticasone (FLONASE) 50 MCG/ACT nasal spray Place 2 sprays into both nostrils daily as needed for allergies.     . furosemide (LASIX) 40 MG tablet Take 1 tablet (40 mg total) by mouth 2 (two) times daily. 180 tablet 0  . gabapentin (NEURONTIN) 800 MG tablet Take 800 mg by mouth 4 (four) times daily.    Marland Kitchen ketoconazole (NIZORAL) 2 % cream Apply 1 application topically 2 (two) times daily.     Marland Kitchen levothyroxine (SYNTHROID, LEVOTHROID) 200 MCG tablet Take 1 tablet (200 mcg total) by mouth daily before breakfast. Takes with 20mcg for a total of 252mcg 90 tablet 2  . Multiple Vitamins-Minerals (CENTRAVITES 50 PLUS PO)  Take by mouth daily.    . ranitidine (ZANTAC) 150 MG tablet Take 150 mg by mouth at bedtime.    . rosuvastatin (CRESTOR) 20 MG tablet Take 20 mg by mouth daily.    . Simethicone (GAS-X PO) Take by mouth as needed.    Marland Kitchen lisinopril (PRINIVIL,ZESTRIL) 10 MG tablet Take 1 tablet (10 mg total) by mouth daily. 90 tablet 0   No facility-administered medications prior to visit.      Allergies:   Fish allergy; Iodinated diagnostic agents; Iodine; Lentil; Penicillins; Lipitor [atorvastatin]; Spironolactone; and Sulfa antibiotics   Social History   Socioeconomic History  . Marital status: Divorced    Spouse name: Not on file  . Number of children: 2  . Years of education: Not on file  . Highest education level: Not on file  Occupational History    Employer: [employer]    Comment: Works in Environmental consultant  Social Needs  . Financial resource strain: Not on file  . Food insecurity:    Worry: Not on file    Inability: Not on file  . Transportation needs:    Medical: Not on file    Non-medical: Not on file  Tobacco Use  . Smoking status: Current Every Day Smoker    Packs/day: 0.50    Years: 21.00    Pack  years: 10.50    Types: Cigarettes    Start date: 07/02/1983  . Smokeless tobacco: Never Used  . Tobacco comment: 5 cigs daily 11-26-12  Substance and Sexual Activity  . Alcohol use: No    Alcohol/week: 0.0 standard drinks  . Drug use: No  . Sexual activity: Not Currently    Birth control/protection: Abstinence, Surgical    Comment: tubal  Lifestyle  . Physical activity:    Days per week: Not on file    Minutes per session: Not on file  . Stress: Not on file  Relationships  . Social connections:    Talks on phone: Not on file    Gets together: Not on file    Attends religious service: Not on file    Active member of club or organization: Not on file    Attends meetings of clubs or organizations: Not on file    Relationship status: Not on file  Other Topics Concern  . Not on file  Social History Narrative  . Not on file     Family History:  The patient's family history includes Cardiomyopathy in her father and mother; Coronary artery disease in her father; Diabetes in her mother; Endometriosis in her sister; Heart disease in her maternal grandfather, maternal grandmother, paternal grandfather, and paternal grandmother; Heart failure in her brother, father, and mother; Hypertension in her brother, daughter, and mother; Obesity in her daughter; Thyroid disease in her mother.   Review of Systems:   Please see the history of present illness.     General:  No chills, fever, night sweats or weight changes.  Cardiovascular:  No chest pain, dyspnea on exertion, edema, orthopnea, palpitations, paroxysmal nocturnal dyspnea. Positive for claudication.  Dermatological: No rash, lesions/masses Respiratory: No cough, dyspnea Urologic: No hematuria, dysuria Abdominal:   No nausea, vomiting, diarrhea, bright red blood per rectum, melena, or hematemesis Neurologic:  No visual changes, wkns, changes in mental status.  Positive for lower extremity paresthesias.  All other systems reviewed and  are otherwise negative except as noted above.   Physical Exam:    VS:  BP 132/70   Pulse 78  Ht 5' 7.5" (1.715 m)   Wt 282 lb (127.9 kg)   SpO2 96%   BMI 43.52 kg/m    General: Well developed, well nourished Caucasian female appearing in no acute distress. Head: Normocephalic, atraumatic, sclera non-icteric, no xanthomas, nares are without discharge.  Neck: No carotid bruits. JVD not elevated.  Lungs: Respirations regular and unlabored, without wheezes or rales.  Heart: Regular rate and rhythm. No S3 or S4.  No murmur, no rubs, or gallops appreciated. Abdomen: Soft, non-tender, non-distended with normoactive bowel sounds. No hepatomegaly. No rebound/guarding. No obvious abdominal masses. Msk:  Strength and tone appear normal for age. No joint deformities or effusions. Extremities: No clubbing or cyanosis. No lower extremity edema.  Distal pedal pulses are 1+ bilaterally. Neuro: Alert and oriented X 3. Moves all extremities spontaneously. No focal deficits noted. Psych:  Responds to questions appropriately with a normal affect. Skin: No rashes or lesions noted  Wt Readings from Last 3 Encounters:  01/23/18 282 lb (127.9 kg)  08/31/17 278 lb (126.1 kg)  08/31/17 278 lb (126.1 kg)     Studies/Labs Reviewed:   EKG:  EKG is not ordered today.   Recent Labs: 08/25/2017: TSH 0.19 12/29/2017: ALT 16; BUN 11; Creatinine, Ser 0.79; Hemoglobin 13.4; Magnesium 2.1; Platelets 248; Potassium 3.6; Sodium 141   Lipid Panel    Component Value Date/Time   CHOL 145 12/29/2017 0442   TRIG 196 (H) 12/29/2017 0442   HDL 34 (L) 12/29/2017 0442   CHOLHDL 4.3 12/29/2017 0442   VLDL 39 12/29/2017 0442   LDLCALC 72 12/29/2017 0442    Additional studies/ records that were reviewed today include:   Echocardiogram: 12/28/2017 Study Conclusions  - Left ventricle: The cavity size was moderately dilated. Wall   thickness was increased in a pattern of moderate LVH. Systolic   function was  moderately reduced. The estimated ejection fraction   was in the range of 35% to 40%. Diffuse hypokinesis. Doppler   parameters are consistent with abnormal left ventricular   relaxation (grade 1 diastolic dysfunction). - Aortic valve: Moderately calcified annulus. Trileaflet;   moderately thickened leaflets. Valve area (VTI): 1.59 cm^2. - Mitral valve: Moderately calcified annulus. Normal thickness   leaflets . - Atrial septum: No defect or patent foramen ovale was identified. - Technically adequate study.   NST: 12/29/2017  There was no ST segment deviation noted during stress.  Findings consistent with prior apical, basal septal, and inferolateral myocardial infarction. There is no current ishemia.  This is a high risk study. Risk based on decreased LVEF, there is no myocardium currently at jeopardy.  The left ventricular ejection fraction is moderately decreased (30-44%).  Assessment:    1. Chronic combined systolic and diastolic heart failure (HCC)   2. Claudication of both lower extremities ([place])   3. Essential hypertension   4. Hyperlipidemia LDL goal <70   5. Medication management      Plan:   In order of problems listed above:  1. Chronic Combined Systolic and Diastolic CHF - the patient has a reduced EF of 20% dating back to 2012 and underwent St. Jude ICD implantation by Dr. Lovena Le in 09/2011. EF was 30-35% by echo in 2015 and most recent echo showed her EF was slightly improved at 35 to 40%. NST during admission showed evidence of prior infarction with no current ischemia.  - She denies any recurrent episodes of chest discomfort since hospital discharge and says her respiratory status has overall been at baseline. She appears  euvolemic by examination today. - Reviewed recent testing results with the patient. Will plan to transition Lisinopril to Entresto 24-26 mg twice daily as discussed during her hospitalization following a 36 hour wash-out period. Will need a  repeat BMET in 2 weeks and can further titrate Entresto as BP allows. Continue Coreg 25mg  BID and Lasix 40mg  BID.   2. Lower Extremity Claudication - The patient describes paresthesias along her lower extremities which occurs with sitting but does develop pain with ambulation and this improves with rest. Distal pulses are slightly diminished bilaterally and 1+ on examination today. - will plan to obtain lower extremity ABI's.  - continue ASA (on 162 mg daily per Neurology) and statin therapy. If ABI's show no acute abnormalities, I encouraged the patient to follow-up with Neurology for possible conduction studies if symptoms persist.   3. HTN - BP is well controlled at 132/70 during today's visit. Continue Carvedilol 25 mg twice daily and will plan to transition Lisinopril to [facility] as outlined above.  4. HLD - Followed by PCP. FLP during recent admission showed total cholesterol 145, triglycerides 196, HDL 34, and LDL 72. - She remains on Crestor 20 mg daily.  Medication Adjustments/Labs and Tests Ordered: Current medicines are reviewed at length with the patient today.  Concerns regarding medicines are outlined above.  Medication changes, Labs and Tests ordered today are listed in the Patient Instructions below. Patient Instructions  Medication Instructions:  Your physician has recommended you make the following change in your medication:  Stop Taking Lisinopril  Start Taking Entresto on Thursday 24-26 mg Two Times Daily   If you need a refill on your cardiac medications before your next appointment, please call your pharmacy.   Lab work: In 2 weeks   If you have labs (blood work) drawn today and your tests are completely normal, you will receive your results only by: Marland Kitchen MyChart Message (if you have MyChart) OR . A paper copy in the mail If you have any lab test that is abnormal or we need to change your treatment, we will call you to review the results.  Testing/Procedures: Your  physician has requested that you have an ankle brachial index (ABI). During this test an ultrasound and blood pressure cuff are used to evaluate the arteries that supply the arms and legs with blood. Allow thirty minutes for this exam. There are no restrictions or special instructions.    Follow-Up: At [facility], you and your health needs are our priority.  As part of our continuing mission to provide you with exceptional heart care, we have created designated Provider Care Teams.  These Care Teams include your primary Cardiologist (physician) and Advanced Practice Providers (APPs -  Physician Assistants and Nurse Practitioners) who all work together to provide you with the care you need, when you need it. You will need a follow up appointment in 6 weeks.  Please call our office 2 months in advance to schedule this appointment.  You may see [name], MD or one of the following Advanced Practice Providers on your designated Care Team:   Bernerd Pho, PA-C [facility]) . Ermalinda Barrios, PA-C ([place])  Any Other Special Instructions Will Be Listed Below (If Applicable). Thank you for choosing [facility]!      Signed, Erma Heritage, PA-C  01/23/2018 4:55 PM    [place] [address] [place], [place] [address] Phone: [phone] ,mso

## 2018-01-23 NOTE — Patient Instructions (Signed)
Medication Instructions:  Your physician has recommended you make the following change in your medication:  Stop Taking Lisinopril  Start Taking Entresto on Thursday 24-26 mg Two Times Daily   If you need a refill on your cardiac medications before your next appointment, please call your pharmacy.   Lab work: NONE  If you have labs (blood work) drawn today and your tests are completely normal, you will receive your results only by: Marland Kitchen MyChart Message (if you have MyChart) OR . A paper copy in the mail If you have any lab test that is abnormal or we need to change your treatment, we will call you to review the results.  Testing/Procedures: Your physician has requested that you have an ankle brachial index (ABI). During this test an ultrasound and blood pressure cuff are used to evaluate the arteries that supply the arms and legs with blood. Allow thirty minutes for this exam. There are no restrictions or special instructions.    Follow-Up: At [facility], you and your health needs are our priority.  As part of our continuing mission to provide you with exceptional heart care, we have created designated Provider Care Teams.  These Care Teams include your primary Cardiologist (physician) and Advanced Practice Providers (APPs -  Physician Assistants and Nurse Practitioners) who all work together to provide you with the care you need, when you need it. You will need a follow up appointment in 6 weeks.  Please call our office 2 months in advance to schedule this appointment.  You may see [name], MD or one of the following Advanced Practice Providers on your designated Care Team:   Bernerd Pho, PA-C [facility]) . Ermalinda Barrios, PA-C ([place])  Any Other Special Instructions Will Be Listed Below (If Applicable). Thank you for choosing [facility]!

## 2018-01-26 ENCOUNTER — Ambulatory Visit (INDEPENDENT_AMBULATORY_CARE_PROVIDER_SITE_OTHER): Payer: Medicare Other | Admitting: *Deleted

## 2018-01-26 DIAGNOSIS — I639 Cerebral infarction, unspecified: Secondary | ICD-10-CM | POA: Diagnosis not present

## 2018-01-29 NOTE — Progress Notes (Signed)
Carelink Summary Report / Loop Recorder 

## 2018-01-31 ENCOUNTER — Ambulatory Visit (HOSPITAL_COMMUNITY)
Admission: RE | Admit: 2018-01-31 | Discharge: 2018-01-31 | Disposition: A | Discharge status: Home / Self Care | Payer: Medicare Other | Source: Ambulatory Visit | Attending: Student | Admitting: Student

## 2018-01-31 DIAGNOSIS — I739 Peripheral vascular disease, unspecified: Secondary | ICD-10-CM | POA: Insufficient documentation

## 2018-02-01 ENCOUNTER — Encounter (HOSPITAL_COMMUNITY): Payer: Self-pay | Admitting: Physical Therapy

## 2018-02-01 ENCOUNTER — Telehealth: Payer: Self-pay | Admitting: *Deleted

## 2018-02-01 NOTE — Telephone Encounter (Signed)
-----   Message from Erma Heritage, [place] sent at 02/01/2018  7:18 AM EST ----- Please let the patient know that her lower extremity ABI's showed no evidence of significant arterial disease. Would encourage her to follow-up with neurology as discussed at the time of her office visit.  Please forward a copy of results to Rosita Fire, MD. Thank you.

## 2018-02-01 NOTE — Therapy (Signed)
[place], [place], [address] Phone: [phone]   Fax:  [phone]  Patient Details  Name: Melanie Cordova MRN: [id number] Date of Birth: 1960-06-25 Referring Provider:  No ref. provider found  Encounter Date: 02/01/2018  PHYSICAL THERAPY DISCHARGE SUMMARY  Visits from Start of Care: 12  Current functional level related to goals / functional outcomes: Unable to fully assess as patient did not return for re-assessment. See most recent re-assessment on 11/24/17 for further detail.    Remaining deficits: Unable to fully assess as patient did not return for re-assessment. See most recent re-assessment on 11/24/17 for further detail.     Education / Equipment: Patient was educated on HEP.  Plan: Patient agrees to discharge.  Patient goals were partially met. Patient is being discharged due to not returning since the last visit.  ?????         Clarene Critchley PT, DPT 9:27 AM, 02/01/18 [place], [place], [address] Phone: [phone]   Fax:  [phone]

## 2018-02-01 NOTE — Telephone Encounter (Signed)
Called pt with test results, no answer. Unable to leave msg.

## 2018-02-06 ENCOUNTER — Telehealth: Payer: Self-pay | Admitting: Cardiology

## 2018-02-06 ENCOUNTER — Ambulatory Visit (INDEPENDENT_AMBULATORY_CARE_PROVIDER_SITE_OTHER): Payer: Medicare Other

## 2018-02-06 DIAGNOSIS — I5022 Chronic systolic (congestive) heart failure: Secondary | ICD-10-CM

## 2018-02-06 DIAGNOSIS — I429 Cardiomyopathy, unspecified: Secondary | ICD-10-CM

## 2018-02-06 LAB — BASIC METABOLIC PANEL
BUN: 9 mg/dL (ref 7–25)
CHLORIDE: 104 mmol/L (ref 98–110)
CO2: 31 mmol/L (ref 20–32)
CREATININE: 0.7 mg/dL (ref 0.50–1.05)
Calcium: 9 mg/dL (ref 8.6–10.4)
Glucose, Bld: 98 mg/dL (ref 65–139)
POTASSIUM: 3.5 mmol/L (ref 3.5–5.3)
Sodium: 141 mmol/L (ref 135–146)

## 2018-02-06 NOTE — Progress Notes (Signed)
Remote ICD transmission.   

## 2018-02-06 NOTE — Telephone Encounter (Signed)
Spoke with pt and reminded pt of remote transmission that is due today. Pt verbalized understanding.   

## 2018-02-12 ENCOUNTER — Telehealth: Payer: Self-pay | Admitting: Internal Medicine

## 2018-02-12 ENCOUNTER — Telehealth: Payer: Self-pay

## 2018-02-12 NOTE — Telephone Encounter (Signed)
New message  Pt c/o medication issue:  1. Name of Medication: Hawthorn Berry   2. How are you currently taking this medication (dosage and times per day)? 1 time daily  3. Are you having a reaction (difficulty breathing--STAT)? n/a  4. What is your medication issue? Patient wants to know if she can take this along with her other medications. It is supposed to help her circulation. Please advise.

## 2018-02-12 NOTE — Telephone Encounter (Signed)
Spoke w/ pt and requested that she send a manual transmission b/c her home monitor has not updated in at least 14 days.   

## 2018-02-12 NOTE — Telephone Encounter (Signed)
New message   Patient has questions about loop recorder.

## 2018-02-12 NOTE — Telephone Encounter (Signed)
Error

## 2018-02-12 NOTE — Telephone Encounter (Signed)
No complete contraindications with her current medications, however there are a few side effects to note. Hawthorn berry can decrease BP - she should monitor BP at home and d/c hawthorn berry if she notices dizziness or low BP readings. It may also increase the risk of bleeding and pt already takes aspirin - she should monitor for symptoms of bleeding/bruising and stop supplement if this occurs. Hawthorn can also cause nausea, dizziness, palpitations, and agitation. Would counsel to d/c supplement if she notices any of these side effects. There is no long term safety data available for this supplement and has not been shown to effectively treat any of her current conditions.

## 2018-02-13 NOTE — Telephone Encounter (Signed)
Call returned to Pt.  Pt asking if we had received her remote.  Per Device tech, awaiting remote for Pt's loop monitor.  Advised Pt still need remote results from her loop monitor.  Pt will try again.

## 2018-02-13 NOTE — Telephone Encounter (Signed)
Returned call to Pt.  Advised of pharmacy recommendations.  Advised Pt that Dr. Lovena Le advises Pt's take a multiple vitamin daily instead of various supplements.  Pt indicates understanding.

## 2018-02-13 NOTE — Telephone Encounter (Signed)
Attempted to call patient x1. VM has not been set up.

## 2018-02-23 LAB — VITAMIN D 25 HYDROXY (VIT D DEFICIENCY, FRACTURES): VIT D 25 HYDROXY: 41 ng/mL (ref 30–100)

## 2018-02-23 LAB — TSH: TSH: 1.65 m[IU]/L (ref 0.40–4.50)

## 2018-02-23 LAB — T4, FREE: FREE T4: 1.1 ng/dL (ref 0.8–1.8)

## 2018-02-28 ENCOUNTER — Ambulatory Visit (INDEPENDENT_AMBULATORY_CARE_PROVIDER_SITE_OTHER): Payer: Medicare Other

## 2018-02-28 DIAGNOSIS — I639 Cerebral infarction, unspecified: Secondary | ICD-10-CM

## 2018-03-01 ENCOUNTER — Encounter: Payer: Self-pay | Admitting: Nutrition

## 2018-03-01 ENCOUNTER — Encounter: Payer: Self-pay | Admitting: "Endocrinology

## 2018-03-01 ENCOUNTER — Ambulatory Visit (INDEPENDENT_AMBULATORY_CARE_PROVIDER_SITE_OTHER): Payer: Medicare Other | Admitting: "Endocrinology

## 2018-03-01 ENCOUNTER — Encounter: Payer: Medicare Other | Attending: Internal Medicine | Admitting: Nutrition

## 2018-03-01 VITALS — BP 122/74 | HR 79 | Ht 67.5 in | Wt 285.0 lb

## 2018-03-01 DIAGNOSIS — E038 Other specified hypothyroidism: Secondary | ICD-10-CM | POA: Diagnosis not present

## 2018-03-01 DIAGNOSIS — E559 Vitamin D deficiency, unspecified: Secondary | ICD-10-CM | POA: Diagnosis not present

## 2018-03-01 DIAGNOSIS — E782 Mixed hyperlipidemia: Secondary | ICD-10-CM | POA: Diagnosis present

## 2018-03-01 MED ORDER — LEVOTHYROXINE SODIUM 200 MCG PO TABS: 200.0000 ug | ORAL_TABLET | Freq: Every day | ORAL | 2 refills | Status: DC

## 2018-03-01 NOTE — Progress Notes (Signed)
Endocrinology follow-up note   Subjective:    Patient ID: Melanie Cordova, female    DOB: 07-Jan-1961, PCP Rosita Fire, MD   Past Medical History:  Diagnosis Date  . Anxiety   . Arthritis   . Asthma   . Automatic implantable cardioverter-defibrillator in situ    2013, july  . Bell palsy    states has had 3 episodes  . Cardiomyopathy 08/2010   Presented with congestive heart failure; EF of 15% and 2012; hypotension on medication precludes optimal dosing  . CHF (congestive heart failure) ([place])    a. EF 20% in 2012 with low-risk NST --> St. Jude ICD implantation by Dr. Lovena Le in 09/2011  . Chronic systolic heart failure ([place])   . COPD (chronic obstructive pulmonary disease) ([place])    2013  . Dysrhythmia   . Gastroesophageal reflux disease   . Headache(784.0)   . Hot flashes 10/04/2016  . Hyperlipidemia   . Hypertension    09/2010-normal CMet and CBC; Lipid profile-116, 88, 25, 73  . Hypothyroidism    Recent TSH was normal.  . 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 Neuromuscular disorder (HCC)    Neuropathy, right foot and leg  . Neuropathy   . Obesity   . Pneumonia   . Seizures ([place])    last one at age 8  . Shortness of breath   . Stroke [facility])     stroke in 07/2012 and another in June, 2014  . Tobacco abuse    20 pack years   Past Surgical History:  Procedure Laterality Date  . CESAREAN SECTION     X2  . COLONOSCOPY N/A 12/16/2013   Procedure: COLONOSCOPY;  Surgeon: Danie Binder, MD;  Location[facility];  Service: Endoscopy;  Laterality: N/A;  10:45-moved to 10/5 @ [place] notified pt  . EP IMPLANTABLE DEVICE     St. Jude  . EP IMPLANTABLE DEVICE N/A 08/13/2015   Procedure: Loop Recorder Insertion;  Surgeon: Evans Lance, MD;  Location: [place] CV LAB;  Service: Cardiovascular;  Laterality: N/A;  . IMPLANTABLE CARDIOVERTER DEFIBRILLATOR IMPLANT N/A 10/10/2011   Procedure: IMPLANTABLE CARDIOVERTER DEFIBRILLATOR IMPLANT;   Surgeon: Evans Lance, MD;  Location: [facility] CATH LAB;  Service: Cardiovascular;  Laterality: N/A;  . MULTIPLE EXTRACTIONS WITH ALVEOLOPLASTY N/A 09/24/2012   Procedure: MULTIPLE EXTRACION #2, 4, 6, 7 ,8, 9, 11, 13, 18, 20, 21, 22, 23, 24, 25, 26, 27, 29 WITH ALVEOLOPLASTY, BIOPSY OF PALATE LESION, REMOVA RIGHT LINGUAL TORUS;  Surgeon: Gae Bon, DDS;  Location: [place];  Service: Oral Surgery;  Laterality: N/A;  . TEE WITHOUT CARDIOVERSION N/A 07/11/2012   Procedure: TRANSESOPHAGEAL ECHOCARDIOGRAM (TEE);  Surgeon: Thayer Headings, MD;  Location: [place];  Service: Cardiovascular;  Laterality: N/A;  . TUBAL LIGATION     Social History   Socioeconomic History  . Marital status: Divorced    Spouse name: Not on file  . Number of children: 2  . Years of education: Not on file  . Highest education level: Not on file  Occupational History    Employer: [employer]    Comment: Works in Environmental consultant  Social Needs  . Financial resource strain: Not on file  . Food insecurity:    Worry: Not on file    Inability: Not on file  . Transportation needs:    Medical: Not on file    Non-medical: Not on file  Tobacco Use  .  Smoking status: Current Every Day Smoker    Packs/day: 0.50    Years: 21.00    Pack years: 10.50    Types: Cigarettes    Start date: 07/02/1983  . Smokeless tobacco: Never Used  . Tobacco comment: 5 cigs daily 11-26-12  Substance and Sexual Activity  . Alcohol use: No    Alcohol/week: 0.0 standard drinks  . Drug use: No  . Sexual activity: Not Currently    Birth control/protection: Abstinence, Surgical    Comment: tubal  Lifestyle  . Physical activity:    Days per week: Not on file    Minutes per session: Not on file  . Stress: Not on file  Relationships  . Social connections:    Talks on phone: Not on file    Gets together: Not on file    Attends religious service: Not on file    Active member of club or organization: Not on file    Attends meetings of  clubs or organizations: Not on file    Relationship status: Not on file  Other Topics Concern  . Not on file  Social History Narrative  . Not on file   Outpatient Encounter Medications as of 03/01/2018  Medication Sig  . albuterol (PROVENTIL HFA;VENTOLIN HFA) 108 (90 BASE) MCG/ACT inhaler Inhale 2 puffs into the lungs every 6 (six) hours as needed for wheezing or shortness of breath.  Marland Kitchen aspirin EC 81 MG tablet Take 81 mg by mouth 2 (two) times daily.   . budesonide-formoterol (SYMBICORT) 80-4.5 MCG/ACT inhaler Inhale 2 puffs into the lungs 2 (two) times daily.  . carvedilol (COREG) 12.5 MG tablet Take 25 mg by mouth 2 (two) times daily with a meal.  . cetirizine (ZYRTEC) 10 MG tablet Take 10 mg by mouth daily.  . Cholecalciferol (VITAMIN D3) 5000 units CAPS TAKE ONE CAPSULE BY MOUTH DAILY (Patient taking differently: Take 50,000 Units by mouth once a week. )  . clobetasol (TEMOVATE) 0.05 % external solution Apply 1 application topically 2 (two) times daily as needed (Apply to scalp.).   Marland Kitchen diclofenac sodium (VOLTAREN) 1 % GEL Apply 2 g topically 4 (four) times daily as needed (pain).   . Ferrous Sulfate (IRON) 28 MG TABS Take 1 tablet by mouth daily.  . fluticasone (FLONASE) 50 MCG/ACT nasal spray Place 2 sprays into both nostrils daily as needed for allergies.   . furosemide (LASIX) 40 MG tablet Take 1 tablet (40 mg total) by mouth 2 (two) times daily.  Marland Kitchen gabapentin (NEURONTIN) 800 MG tablet Take 800 mg by mouth 4 (four) times daily.  Marland Kitchen ketoconazole (NIZORAL) 2 % cream Apply 1 application topically 2 (two) times daily.   Marland Kitchen levothyroxine (SYNTHROID, LEVOTHROID) 200 MCG tablet Take 1 tablet (200 mcg total) by mouth daily before breakfast.  . Multiple Vitamins-Minerals (CENTRAVITES 50 PLUS PO) Take by mouth daily.  . rosuvastatin (CRESTOR) 20 MG tablet Take 20 mg by mouth daily.  . sacubitril-valsartan (ENTRESTO) 24-26 MG Take 1 tablet by mouth 2 (two) times daily.  . Simethicone (GAS-X PO)  Take by mouth as needed.  . [DISCONTINUED] levothyroxine (SYNTHROID, LEVOTHROID) 200 MCG tablet Take 1 tablet (200 mcg total) by mouth daily before breakfast. Takes with 53mcg for a total of 241mcg  . [DISCONTINUED] ranitidine (ZANTAC) 150 MG tablet Take 150 mg by mouth at bedtime.   No facility-administered encounter medications on file as of 03/01/2018.    ALLERGIES: Allergies  Allergen Reactions  . Fish Allergy Anaphylaxis  . Iodinated Diagnostic  Agents Hives and Other (See Comments)    Pulmonary problems; no frank respiratory arrest  . Iodine Hives and Other (See Comments)    Pulmonary Problems   . Lentil Anaphylaxis  . Penicillins Anaphylaxis and Shortness Of Breath    Has patient had a PCN reaction causing immediate rash, facial/tongue/throat swelling, SOB or lightheadedness with hypotension: Yes Has patient had a PCN reaction causing severe rash involving mucus membranes or skin necrosis: No Has patient had a PCN reaction that required hospitalization No Has patient had a PCN reaction occurring within the last 10 years: No If all of the above answers are "NO", then may proceed with Cephalosporin use.  Hair loss  . Lipitor [Atorvastatin] Itching and Rash  . Spironolactone Rash  . Sulfa Antibiotics Other (See Comments)    Unknown   VACCINATION STATUS: Immunization History  Administered Date(s) Administered  . Pneumococcal Polysaccharide-23 09/28/2010, 11/18/2010  . Pneumococcal-Unspecified 09/22/2016  . Tdap 09/28/2016    HPI  57 yr old female with medical history as above. she is here with repeat labs for follow-up of hypothyroidism.  She is on levothyroxine 200 mcg p.o. nightly.  She reports compliance with his medication. -She has had no success in weight loss. She decided not to pursue bariatric surgery for her weight problem. - she has had 24 hour urine cortisol to r/o Cushings syndrome which was normal.  - she denies palpitations, heat intolerance. she denies  chest pain, SOB. she has family history of thyroid dysfunction in her mother. she has AICD . she smokes 1/2 PPD ( 30 PY).  Review of Systems  Constitutional:  +  weight gain  , no fatigue, no subjective hyperthermia/hypothermia Eyes: no blurry vision, no xerophthalmia ENT: no sore throat, no nodules palpated in throat, no nausea, no odynophagia.  Musculoskeletal: no muscle/joint aches Skin: no rashes Neurological: no tremors, no numbness.  Psychiatric: no depression/anxiety  Objective:    BP 122/74   Pulse 79   Ht 5' 7.5" (1.715 m)   Wt 285 lb (129.3 kg)   BMI 43.98 kg/m   Wt Readings from Last 3 Encounters:  03/01/18 285 lb (129.3 kg)  03/01/18 285 lb (129.3 kg)  01/23/18 282 lb (127.9 kg)    Physical Exam  Constitutional: obese, not in acute distress.   Eyes: PERRLA, EOMI, no exophthalmos ENT: moist mucous membranes, no thyromegaly, no cervical lymphadenopathy Musculoskeletal: no deformities, strength intact in all 4 Skin: moist, warm, no rashes Neurological: no tremor with outstretched hands.  Diabetic Labs (most recent): Lab Results  Component Value Date   HGBA1C 5.1 08/25/2017   HGBA1C 5.2 08/23/2016   HGBA1C 5.7 (H) 07/26/2015       Component Value Date/Time   CHOL 145 12/29/2017 0442   TRIG 196 (H) 12/29/2017 0442   HDL 34 (L) 12/29/2017 0442   CHOLHDL 4.3 12/29/2017 0442   VLDL 39 12/29/2017 0442   LDLCALC 72 12/29/2017 0442   August 25, 2017 labs show TSH suppressed at 0.19, free T4 high normal at 1.7  Results for LOUCILLE, TAKACH (MRN [id number]) as of 03/01/2018 09:08  Ref. Range 08/25/2017 09:01 02/22/2018 12:22  TSH Latest Ref Range: 0.40 - 4.50 mIU/L 0.19 (L) 1.65  T4,Free(Direct) Latest Ref Range: 0.8 - 1.8 ng/dL 1.7 1.1    Assessment & Plan:   1. Hypothyroidism  -she has long standing hypothyroidism . - Her recent labs for thyroid function tests are consistent with appropriate replacement.   Shee is advised to continue  levothyroxine  to 200 mcg p.o. before breakfast.   - We discussed about correct intake of levothyroxine, at fasting, with water, separated by at least 30 minutes from breakfast, and separated by more than 4 hours from calcium, iron, multivitamins, acid reflux medications (PPIs). -Patient is made aware of the fact that thyroid hormone replacement is needed for life, dose to be adjusted by periodic monitoring of thyroid function tests.  2. Morbid obesity due to excess calories ([place])   - she is given detailed carbs and exercise information. She does not have diabetes-her recent labs show A1c of 5.1%. Her 24 hour urine free cotisol is normal at 14.5, rules out Cushing's synrome.  She following with Jearld Fenton, CDE. Due to her legitimate concern on weight, I discussed bariatric surgery , however, she decided not to pursue that option.  -  Suggestion is made for her to avoid simple carbohydrates  from her diet including Cakes, Sweet Desserts / Pastries, Ice Cream, Soda (diet and regular), Sweet Tea, Candies, Chips, Cookies, Store Bought Juices, Alcohol in Excess of  1-2 drinks a day, Artificial Sweeteners, and "Sugar-free" Products. This will help patient to have stable blood glucose profile and potentially avoid unintended weight gain.  3. Vitamin D deficiency: She is to continue with vitamin D3 5000 units daily for the next 90 days.  4. Chronic heavy smoker:  She has COPD. She is  extensively counseled against smoking. She is not ready to quit.  - I advised patient to maintain close follow up with Rosita Fire, MD for primary care needs. Follow up plan: Return in about 6 months (around 08/31/2018) for Follow up with Pre-visit Labs.  Glade Lloyd, MD Phone: [phone]  Fax: [phone]  -  This note was partially dictated with voice recognition software. Similar sounding words can be transcribed inadequately or may not  be corrected upon review.  03/01/2018, 9:55 AM

## 2018-03-01 NOTE — Progress Notes (Signed)
  Medical Nutrition Therapy:  Appt start time: 0900  time: 930 Assessment:  Primary concerns today: Obesity and prediabetes follow up. Gained 9 lbs. She admits to drinking sweet tea.  Saw Dr. [name] today. Says her thyroid is balanced now.  Admits she needs to start exercising. Financial issues currently paying bills. Elevated TG noted. BMI 43  Lab Results  Component Value Date   HGBA1C 5.1 08/25/2017   Wt Readings from Last 3 Encounters:  03/01/18 285 lb (129.3 kg)  03/01/18 285 lb (129.3 kg)  01/23/18 282 lb (127.9 kg)   Ht Readings from Last 3 Encounters:  03/01/18 5' 7.5" (1.715 m)  01/23/18 5' 7.5" (1.715 m)  08/31/17 5' 7.5" (1.715 m)   Lipid Panel     Component Value Date/Time   CHOL 145 12/29/2017 0442   TRIG 196 (H) 12/29/2017 0442   HDL 34 (L) 12/29/2017 0442   CHOLHDL 4.3 12/29/2017 0442   VLDL 39 12/29/2017 0442   LDLCALC 72 12/29/2017 0442   CMP Latest Ref Rng & Units 02/06/2018 12/29/2017 12/28/2017  Glucose 65 - 139 mg/dL 98 91 104(H)  BUN 7 - 25 mg/dL 9 11 14   Creatinine 0.50 - 1.05 mg/dL 0.70 0.79 0.85  Sodium 135 - 146 mmol/L 141 141 137  Potassium 3.5 - 5.3 mmol/L 3.5 3.6 3.5  Chloride 98 - 110 mmol/L 104 106 102  CO2 20 - 32 mmol/L 31 26 28   Calcium 8.6 - 10.4 mg/dL 9.0 8.7(L) 8.6(L)  Total Protein 6.5 - 8.1 g/dL - 6.5 -  Total Bilirubin 0.3 - 1.2 mg/dL - 0.5 -  Alkaline Phos 38 - 126 U/L - 77 -  AST 15 - 41 U/L - 17 -  ALT 0 - 44 U/L - 16 -    Preferred Learning Style:     No preference indicated   Learning Readiness:    Ready  Change in progress   MEDICATIONS: see list   DIETARY INTAKE:  24-hr recall:  B ( AM):boiled egg,oatmeal L ( PM):  Salad chicken, water, sweet tea D ( PM): Collard greens, sweet potato and chicken breast, swet tea Usual physical activity: walking 30 minutes a few times per week.   Estimated energy needs: 1600 calories 180 g carbohydrates 120 g protein 44 g fat  Progress Towards Goal(s):  In  progress.   Nutritional Diagnosis:  NB-1.1 Food and nutrition-related knowledge deficit As related to Obesity.  As evidenced by BMI > 40.    Intervention:  Nutrition counseling on healthy weight loss tips, ways to prevent diabetes and importance of exercise. Low sodium, low fat, high fiber diet and meal planning and exercises for needed weight loss. High Fiber low fat diet   Goal. Cut out sweet tea  Dink more water Walk 30 minutes a day.  Lose 3 lbs per month   Teaching Method Utilized:  Visual Auditory Hands on  Handouts given during visit include: Healthy Weight loss tips Chair exercises  Barriers to learning/adherence to lifestyle change: none  Demonstrated degree of understanding via:  Teach Back   Monitoring/Evaluation:  Dietary intake, exercise, meal planning, and body weight in 6  month(s).

## 2018-03-01 NOTE — Patient Instructions (Addendum)
Goal. Cut out sweet tea  Dink more water Walk 30 minutes a day.  Lose 3 lbs per month

## 2018-03-01 NOTE — Progress Notes (Signed)
Carelink Summary Report / Loop Recorder 

## 2018-03-13 ENCOUNTER — Ambulatory Visit (INDEPENDENT_AMBULATORY_CARE_PROVIDER_SITE_OTHER): Payer: Medicare Other | Admitting: Student

## 2018-03-13 ENCOUNTER — Encounter: Payer: Self-pay | Admitting: Student

## 2018-03-13 VITALS — BP 114/78 | HR 77 | Ht 67.0 in | Wt 282.0 lb

## 2018-03-13 DIAGNOSIS — I639 Cerebral infarction, unspecified: Secondary | ICD-10-CM

## 2018-03-13 DIAGNOSIS — I5042 Chronic combined systolic (congestive) and diastolic (congestive) heart failure: Secondary | ICD-10-CM | POA: Diagnosis not present

## 2018-03-13 DIAGNOSIS — I429 Cardiomyopathy, unspecified: Secondary | ICD-10-CM | POA: Diagnosis not present

## 2018-03-13 DIAGNOSIS — I739 Peripheral vascular disease, unspecified: Secondary | ICD-10-CM

## 2018-03-13 DIAGNOSIS — Z720 Tobacco use: Secondary | ICD-10-CM

## 2018-03-13 DIAGNOSIS — E785 Hyperlipidemia, unspecified: Secondary | ICD-10-CM

## 2018-03-13 DIAGNOSIS — I10 Essential (primary) hypertension: Secondary | ICD-10-CM

## 2018-03-13 NOTE — Patient Instructions (Signed)
Medication Instructions:  Your physician recommends that you continue on your current medications as directed. Please refer to the Current Medication list given to you today.  If you need a refill on your cardiac medications before your next appointment, please call your pharmacy.   Lab work: NONE  If you have labs (blood work) drawn today and your tests are completely normal, you will receive your results only by: Marland Kitchen MyChart Message (if you have MyChart) OR . A paper copy in the mail If you have any lab test that is abnormal or we need to change your treatment, we will call you to review the results.  Testing/Procedures: NONE   Follow-Up: At [facility], you and your health needs are our priority.  As part of our continuing mission to provide you with exceptional heart care, we have created designated Provider Care Teams.  These Care Teams include your primary Cardiologist (physician) and Advanced Practice Providers (APPs -  Physician Assistants and Nurse Practitioners) who all work together to provide you with the care you need, when you need it. You will need a follow up appointment in 6 months.  Please call our office 2 months in advance to schedule this appointment.  You may see [name], MD or one of the following Advanced Practice Providers on your designated Care Team:   Bernerd Pho, PA-C [facility]) . Ermalinda Barrios, PA-C ([place])  Any Other Special Instructions Will Be Listed Below (If Applicable). Thank you for choosing [facility]!

## 2018-03-13 NOTE — Progress Notes (Signed)
Cardiology Office Note    Date:  03/13/2018   ID:  Melanie Cordova, DOB 03-Jun-1960, MRN [id number]  PCP:  Rosita Fire, MD  Cardiologist: [name], MD    Chief Complaint  Patient presents with  . Follow-up    6 week visit    History of Present Illness:    Melanie Cordova is a 57 y.o. female with past medical history of presumed nonischemic cardiomyopathy (EF 20% in 2012 with low-risk NST --> St. Jude ICD implantation by Dr. Ashley Mariner 09/2011), chronic combined systolic and diastolic CHF (EF 62-56% by echo in 2015, at 35-40% by repeat imaging in 12/2017), HTN, HLD, COPD and prior CVA (ILR in place) who presents to the office today for 6-week follow-up.  She was last examined by myself in 01/2018 for hospital follow-up from a recent admission for chest discomfort during which a Lexiscan Myoview was performed and showed evidence of prior infarction with no current ischemia. At the time of her office visit, she denied any recurrent chest discomfort and her respiratory status had been at baseline. She did report having pain along her lower extremities over the past several months. It was recommended that lower extremity dopplers be obtained for further evaluation. In regards to her CHF, Lisinopril was transitioned to [facility] 24-26mg  BID along with her being continued on Coreg 25mg  BID and Lasix 40mg  BID.   In talking with the patient today, she reports overall doing well from a cardiac perspective since her last office visit. Reports that her "breathing is the best it has been in years".  She went for a walk yesterday and denies any associated symptoms with this. No recent chest pain, orthopnea, PND, or lower extremity edema.  She does report having a "weird sensation" along her ICD site last week lasting for a few minutes. Denies that her ICD fired. She is due for a remote transmission next month.  Past Medical History:  Diagnosis Date  . Anxiety   . Arthritis   . Asthma   .  Automatic implantable cardioverter-defibrillator in situ    2013, july  . Bell palsy    states has had 3 episodes  . Cardiomyopathy 08/2010   Presented with congestive heart failure; EF of 15% and 2012; hypotension on medication precludes optimal dosing  . CHF (congestive heart failure) ([place])    a. EF 20% in 2012 with low-risk NST --> St. Jude ICD implantation by Dr. Lovena Le in 09/2011  . Chronic systolic heart failure ([place])   . COPD (chronic obstructive pulmonary disease) ([place])    2013  . Dysrhythmia   . Gastroesophageal reflux disease   . Headache(784.0)   . Hot flashes 10/04/2016  . Hyperlipidemia   . Hypertension    09/2010-normal CMet and CBC; Lipid profile-116, 88, 25, 73  . Hypothyroidism    Recent TSH was normal.  . 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 Neuromuscular disorder (HCC)    Neuropathy, right foot and leg  . Neuropathy   . Obesity   . Pneumonia   . Seizures ([place])    last one at age 59  . Shortness of breath   . Stroke [facility])     stroke in 07/2012 and another in June, 2014  . Tobacco abuse    20 pack years    Past Surgical History:  Procedure Laterality Date  . CESAREAN SECTION     X2  . COLONOSCOPY N/A 12/16/2013   Procedure: COLONOSCOPY;  Surgeon: Marga Melnick  Fields, MD;  [facility];  Service: Endoscopy;  Laterality: N/A;  10:45-moved to 10/5 @ [place] notified pt  . EP IMPLANTABLE DEVICE     St. Jude  . EP IMPLANTABLE DEVICE N/A 08/13/2015   Procedure: Loop Recorder Insertion;  Surgeon: Evans Lance, MD;  Location: [place] CV LAB;  Service: Cardiovascular;  Laterality: N/A;  . IMPLANTABLE CARDIOVERTER DEFIBRILLATOR IMPLANT N/A 10/10/2011   Procedure: IMPLANTABLE CARDIOVERTER DEFIBRILLATOR IMPLANT;  Surgeon: Evans Lance, MD;  Location: [facility] CATH LAB;  Service: Cardiovascular;  Laterality: N/A;  . MULTIPLE EXTRACTIONS WITH ALVEOLOPLASTY N/A 09/24/2012   Procedure: MULTIPLE EXTRACION #2, 4, 6, 7 ,8, 9, 11, 13, 18, 20,  21, 22, 23, 24, 25, 26, 27, 29 WITH ALVEOLOPLASTY, BIOPSY OF PALATE LESION, REMOVA RIGHT LINGUAL TORUS;  Surgeon: Gae Bon, DDS;  Location: [place];  Service: Oral Surgery;  Laterality: N/A;  . TEE WITHOUT CARDIOVERSION N/A 07/11/2012   Procedure: TRANSESOPHAGEAL ECHOCARDIOGRAM (TEE);  Surgeon: Thayer Headings, MD;  Location: [place];  Service: Cardiovascular;  Laterality: N/A;  . TUBAL LIGATION      Current Medications: Outpatient Medications Prior to Visit  Medication Sig Dispense Refill  . albuterol (PROVENTIL HFA;VENTOLIN HFA) 108 (90 BASE) MCG/ACT inhaler Inhale 2 puffs into the lungs every 6 (six) hours as needed for wheezing or shortness of breath. 1 Inhaler 5  . aspirin EC 81 MG tablet Take 81 mg by mouth 2 (two) times daily.     . budesonide-formoterol (SYMBICORT) 80-4.5 MCG/ACT inhaler Inhale 2 puffs into the lungs 2 (two) times daily.    . carvedilol (COREG) 12.5 MG tablet Take 25 mg by mouth 2 (two) times daily with a meal.    . cetirizine (ZYRTEC) 10 MG tablet Take 10 mg by mouth daily.    . Cholecalciferol (VITAMIN D3) 5000 units CAPS TAKE ONE CAPSULE BY MOUTH DAILY (Patient taking differently: Take 50,000 Units by mouth once a week. ) 90 capsule 0  . clobetasol (TEMOVATE) 0.05 % external solution Apply 1 application topically 2 (two) times daily as needed (Apply to scalp.).     Marland Kitchen diclofenac sodium (VOLTAREN) 1 % GEL Apply 2 g topically 4 (four) times daily as needed (pain).     . famotidine (PEPCID) 10 MG tablet Take 10 mg by mouth daily as needed for heartburn or indigestion.    . Ferrous Sulfate (IRON) 28 MG TABS Take 1 tablet by mouth daily.    . fluticasone (FLONASE) 50 MCG/ACT nasal spray Place 2 sprays into both nostrils daily as needed for allergies.     . furosemide (LASIX) 40 MG tablet Take 1 tablet (40 mg total) by mouth 2 (two) times daily. 180 tablet 0  . gabapentin (NEURONTIN) 800 MG tablet Take 800 mg by mouth 4 (four) times daily.    Marland Kitchen ketoconazole  (NIZORAL) 2 % cream Apply 1 application topically 2 (two) times daily.     Marland Kitchen levothyroxine (SYNTHROID, LEVOTHROID) 200 MCG tablet Take 1 tablet (200 mcg total) by mouth daily before breakfast. 90 tablet 2  . Multiple Vitamins-Minerals (CENTRAVITES 50 PLUS PO) Take by mouth daily.    . rosuvastatin (CRESTOR) 20 MG tablet Take 20 mg by mouth daily.    . sacubitril-valsartan (ENTRESTO) 24-26 MG Take 1 tablet by mouth 2 (two) times daily. 60 tablet 11  . Simethicone (GAS-X PO) Take by mouth as needed.     No facility-administered medications prior to visit.      Allergies:  Fish allergy; Iodinated diagnostic agents; Iodine; Lentil; Penicillins; Lipitor [atorvastatin]; Spironolactone; and Sulfa antibiotics   Social History   Socioeconomic History  . Marital status: Divorced    Spouse name: Not on file  . Number of children: 2  . Years of education: Not on file  . Highest education level: Not on file  Occupational History    Employer: [employer]    Comment: Works in Environmental consultant  Social Needs  . Financial resource strain: Not on file  . Food insecurity:    Worry: Not on file    Inability: Not on file  . Transportation needs:    Medical: Not on file    Non-medical: Not on file  Tobacco Use  . Smoking status: Current Every Day Smoker    Packs/day: 0.50    Years: 21.00    Pack years: 10.50    Types: Cigarettes    Start date: 07/02/1983  . Smokeless tobacco: Never Used  . Tobacco comment: 5 cigs daily 11-26-12  Substance and Sexual Activity  . Alcohol use: No    Alcohol/week: 0.0 standard drinks  . Drug use: No  . Sexual activity: Not Currently    Birth control/protection: Abstinence, Surgical    Comment: tubal  Lifestyle  . Physical activity:    Days per week: Not on file    Minutes per session: Not on file  . Stress: Not on file  Relationships  . Social connections:    Talks on phone: Not on file    Gets together: Not on file    Attends religious service: Not  on file    Active member of club or organization: Not on file    Attends meetings of clubs or organizations: Not on file    Relationship status: Not on file  Other Topics Concern  . Not on file  Social History Narrative  . Not on file     Family History:  The patient's family history includes Cardiomyopathy in her father and mother; Coronary artery disease in her father; Diabetes in her mother; Endometriosis in her sister; Heart disease in her maternal grandfather, maternal grandmother, paternal grandfather, and paternal grandmother; Heart failure in her brother, father, and mother; Hypertension in her brother, daughter, and mother; Obesity in her daughter; Thyroid disease in her mother.   Review of Systems:   Please see the history of present illness.     General:  No chills, fever, night sweats or weight changes.  Cardiovascular:  No chest pain, dyspnea on exertion, edema, orthopnea, paroxysmal nocturnal dyspnea. Positive for palpitations.  Dermatological: No rash, lesions/masses Respiratory: No cough, dyspnea Urologic: No hematuria, dysuria Abdominal:   No nausea, vomiting, diarrhea, bright red blood per rectum, melena, or hematemesis Neurologic:  No visual changes, wkns, changes in mental status. All other systems reviewed and are otherwise negative except as noted above.   Physical Exam:    VS:  BP 114/78   Pulse 77   Ht 5\' 7"  (1.702 m)   Wt 282 lb (127.9 kg)   SpO2 96%   BMI 44.17 kg/m    General: Well developed, obese Caucasian female appearing in no acute distress. Head: Normocephalic, atraumatic, sclera non-icteric, no xanthomas, nares are without discharge.  Neck: No carotid bruits. JVD not elevated.  Lungs: Respirations regular and unlabored, without wheezes or rales.  Heart: Regular rate and rhythm. No S3 or S4.  No murmur, no rubs, or gallops appreciated. Abdomen: Soft, non-tender, non-distended with normoactive bowel sounds. No  hepatomegaly. No rebound/guarding.  No obvious abdominal masses. Msk:  Strength and tone appear normal for age. No joint deformities or effusions. Extremities: No clubbing or cyanosis. Trace ankle edema bilaterally.  Distal pedal pulses are 2+ bilaterally. Neuro: Alert and oriented X 3. Moves all extremities spontaneously. No focal deficits noted. Psych:  Responds to questions appropriately with a normal affect. Skin: No rashes or lesions noted  Wt Readings from Last 3 Encounters:  03/13/18 282 lb (127.9 kg)  03/01/18 285 lb (129.3 kg)  03/01/18 285 lb (129.3 kg)     Studies/Labs Reviewed:   EKG:  EKG is not ordered today.    Recent Labs: 12/29/2017: ALT 16; Hemoglobin 13.4; Magnesium 2.1; Platelets 248 02/06/2018: BUN 9; Creat 0.70; Potassium 3.5; Sodium 141 02/22/2018: TSH 1.65   Lipid Panel    Component Value Date/Time   CHOL 145 12/29/2017 0442   TRIG 196 (H) 12/29/2017 0442   HDL 34 (L) 12/29/2017 0442   CHOLHDL 4.3 12/29/2017 0442   VLDL 39 12/29/2017 0442   LDLCALC 72 12/29/2017 0442    Additional studies/ records that were reviewed today include:   Echocardiogram: 12/2017 Study Conclusions  - Left ventricle: The cavity size was moderately dilated. Wall   thickness was increased in a pattern of moderate LVH. Systolic   function was moderately reduced. The estimated ejection fraction   was in the range of 35% to 40%. Diffuse hypokinesis. Doppler   parameters are consistent with abnormal left ventricular   relaxation (grade 1 diastolic dysfunction). - Aortic valve: Moderately calcified annulus. Trileaflet;   moderately thickened leaflets. Valve area (VTI): 1.59 cm^2. - Mitral valve: Moderately calcified annulus. Normal thickness   leaflets . - Atrial septum: No defect or patent foramen ovale was identified. - Technically adequate study.   NST: 12/2017   There was no ST segment deviation noted during stress.  Findings consistent with prior apical, basal septal, and inferolateral myocardial  infarction. There is no current ishemia.  This is a high risk study. Risk based on decreased LVEF, there is no myocardium currently at jeopardy.  The left ventricular ejection fraction is moderately decreased (30-44%).  ABI: 01/2018 FINDINGS: Right ABI:  1.1  Left ABI:  1.0  Right Lower Extremity:  Normal arterial waveforms at the ankle.  Left Lower Extremity:  Normal arterial waveforms at the ankle.  1.0-1.4 Normal  IMPRESSION: Normal examination. No evidence of hemodynamically significant peripheral arterial disease.  Assessment:    1. Chronic combined systolic and diastolic heart failure (HCC)   2. Cardiomyopathy, unspecified type ([place])   3. Claudication of both lower extremities ([place])   4. Essential hypertension   5. Hyperlipidemia LDL goal <70   6. Cryptogenic stroke ([place])   7. Tobacco use      Plan:   In order of problems listed above:  1. Chronic Combined Systolic and Diastolic CHF - The patient has a known reduced EF of 20% in 2012, 30 to 35% by echocardiogram in 2015, and 35 to 40% by repeat imaging in 12/2017. Reports that weight has overall been stable on her home scales and she denies any recent dyspnea on exertion, orthopnea, PND, or lower extremity edema. Appears euvolemic by examination today. - Will continue on her current medication regimen of Coreg 25 mg twice daily, Lasix 40 mg twice daily, and Entresto 24-26 mg twice daily. Would not further titrate Entresto at this time given her lower BP readings.   2. Nonischemic Cardiomyopathy - s/p St. Jude ICD implantation by  Dr. Ashley Mariner 09/2011. She does report having a "weird sensation" along her ICD last week but says her ICD did not fire. She is due for a remote transmission on 04/02/2018.  Will reach out to the device clinic to see if this can be obtained sooner than her scheduled date. - continue Coreg, Lasix, and Entresto as outlined above.   3. Lower Extremity Claudication - At the time of her  last office visit she reported having pain along her lower extremities and doppler studies showed normal ABI's with no evidence of hemodynamically significant PAD. She now describes her symptoms as paresthesias along her lower extremities bilaterally. I recommended she follow-up with Neurology for further evaluation of this. She remains on Gabapentin but takes less medication than prescribed given the side effect of fatigue.  4. HTN - BP is well controlled at 114/78 during today's visit. Continue current medication regimen.  5. HLD - FLP in 12/2017 showed total cholesterol 145, triglycerides 196, HDL 34, and LDL 72.  Continue Crestor 20 mg daily.  6. Prior CVA - ILR in place with no significant arrhythmias documented as of yet. Remains on ASA 162mg  daily.   7. Tobacco Use - she continues to smoke 0.5 ppd. Cessation advised.    Medication Adjustments/Labs and Tests Ordered: Current medicines are reviewed at length with the patient today.  Concerns regarding medicines are outlined above.  Medication changes, Labs and Tests ordered today are listed in the Patient Instructions below. Patient Instructions  Medication Instructions:  Your physician recommends that you continue on your current medications as directed. Please refer to the Current Medication list given to you today.  If you need a refill on your cardiac medications before your next appointment, please call your pharmacy.   Lab work: NONE  If you have labs (blood work) drawn today and your tests are completely normal, you will receive your results only by: Marland Kitchen MyChart Message (if you have MyChart) OR . A paper copy in the mail If you have any lab test that is abnormal or we need to change your treatment, we will call you to review the results.  Testing/Procedures: NONE   Follow-Up: At [facility], you and your health needs are our priority.  As part of our continuing mission to provide you with exceptional heart care, we have  created designated Provider Care Teams.  These Care Teams include your primary Cardiologist (physician) and Advanced Practice Providers (APPs -  Physician Assistants and Nurse Practitioners) who all work together to provide you with the care you need, when you need it. You will need a follow up appointment in 6 months.  Please call our office 2 months in advance to schedule this appointment.  You may see [name], MD or one of the following Advanced Practice Providers on your designated Care Team:   Bernerd Pho, PA-C [facility]) . Ermalinda Barrios, PA-C ([place])  Any Other Special Instructions Will Be Listed Below (If Applicable). Thank you for choosing [facility]!     Signed, Erma Heritage, PA-C  03/13/2018 4:48 PM    [place] [address] [place], [place] [address] Phone: [phone]

## 2018-03-15 ENCOUNTER — Telehealth: Payer: Self-pay | Admitting: Cardiology

## 2018-03-15 NOTE — Telephone Encounter (Signed)
-----   Message from Erma Heritage, [place] sent at 03/13/2018  4:53 PM EST ----- Evelena Leyden,   Can you please arrange for this patient to have a remote transmission within the next week? She reported having a "weird sensation" along her ICD last week but says her ICD did not fire. Perhaps ATP or absolutely nothing but wanted to make sure.   Thanks for your help!  [place],  [place]

## 2018-03-15 NOTE — Telephone Encounter (Signed)
Spoke w/ pt and requested that she send the a remote transmission w/ her home monitor. Informed her that once the transmission is received a Nurse will review and then call her if anything is abnormal otherwise no news is good news. Pt verbalized understanding.

## 2018-03-16 NOTE — Telephone Encounter (Signed)
Transmission reviewed. Normal device function. Histograms appropriate. Presenting rhythm shows Vs at ~96bpm. V paced <1%. No ventricular arrhythmia episodes. 1 V noise reversion on 09/18/17 and 2 magnet responses, most recently on 08/16/17 (noted in device history).

## 2018-03-18 LAB — CUP PACEART REMOTE DEVICE CHECK
Date Time Interrogation Session: 20191116031055
Date Time Interrogation Session: 20191219033636
Implantable Lead Implant Date: 20130729
Implantable Lead Location: 753860
Implantable Lead Model: 181
Implantable Lead Model: 181
Implantable Lead Serial Number: 322336
Implantable Lead Serial Number: 322336
Implantable Pulse Generator Implant Date: 20170601
MDC IDC LEAD IMPLANT DT: 20130729
MDC IDC LEAD LOCATION: 753860
MDC IDC PG IMPLANT DT: 20170601

## 2018-03-30 LAB — CUP PACEART REMOTE DEVICE CHECK
Battery Voltage: 2.87 V
Brady Statistic RV Percent Paced: 1 %
Date Time Interrogation Session: 20191126192101
HighPow Impedance: 87 Ohm
HighPow Impedance: 87 Ohm
Implantable Lead Location: 753860
Lead Channel Setting Pacing Amplitude: 2.5 V
Lead Channel Setting Sensing Sensitivity: 0.5 mV
MDC IDC LEAD IMPLANT DT: 20130729
MDC IDC LEAD SERIAL: 322336
MDC IDC MSMT BATTERY REMAINING LONGEVITY: 47 mo
MDC IDC MSMT BATTERY REMAINING PERCENTAGE: 44 %
MDC IDC MSMT LEADCHNL RV IMPEDANCE VALUE: 690 Ohm
MDC IDC MSMT LEADCHNL RV PACING THRESHOLD AMPLITUDE: 0.75 V
MDC IDC MSMT LEADCHNL RV PACING THRESHOLD PULSEWIDTH: 0.5 ms
MDC IDC MSMT LEADCHNL RV SENSING INTR AMPL: 12 mV
MDC IDC PG IMPLANT DT: 20130729
MDC IDC SET LEADCHNL RV PACING PULSEWIDTH: 0.5 ms
Pulse Gen Serial Number: 1022442

## 2018-04-02 ENCOUNTER — Ambulatory Visit (INDEPENDENT_AMBULATORY_CARE_PROVIDER_SITE_OTHER): Payer: Medicare Other

## 2018-04-02 DIAGNOSIS — I639 Cerebral infarction, unspecified: Secondary | ICD-10-CM

## 2018-04-03 LAB — CUP PACEART REMOTE DEVICE CHECK
Implantable Lead Implant Date: 20130729
Implantable Lead Location: 753860
Implantable Lead Serial Number: 322336
Implantable Pulse Generator Implant Date: 20170601
MDC IDC SESS DTM: 20200121051048

## 2018-04-03 NOTE — Progress Notes (Signed)
Carelink Summary Report / Loop Recorder 

## 2018-04-08 ENCOUNTER — Emergency Department (HOSPITAL_COMMUNITY)
Admission: EM | Admit: 2018-04-08 | Discharge: 2018-04-08 | Disposition: A | Discharge status: Home / Self Care | Payer: Medicare Other | Attending: Emergency Medicine | Admitting: Emergency Medicine

## 2018-04-08 ENCOUNTER — Encounter (HOSPITAL_COMMUNITY): Payer: Self-pay | Admitting: Emergency Medicine

## 2018-04-08 ENCOUNTER — Other Ambulatory Visit: Payer: Self-pay

## 2018-04-08 ENCOUNTER — Emergency Department (HOSPITAL_COMMUNITY): Payer: Medicare Other

## 2018-04-08 DIAGNOSIS — J449 Chronic obstructive pulmonary disease, unspecified: Secondary | ICD-10-CM | POA: Diagnosis not present

## 2018-04-08 DIAGNOSIS — E039 Hypothyroidism, unspecified: Secondary | ICD-10-CM | POA: Insufficient documentation

## 2018-04-08 DIAGNOSIS — F17210 Nicotine dependence, cigarettes, uncomplicated: Secondary | ICD-10-CM | POA: Insufficient documentation

## 2018-04-08 DIAGNOSIS — Z7982 Long term (current) use of aspirin: Secondary | ICD-10-CM | POA: Insufficient documentation

## 2018-04-08 DIAGNOSIS — J209 Acute bronchitis, unspecified: Secondary | ICD-10-CM | POA: Insufficient documentation

## 2018-04-08 DIAGNOSIS — I5022 Chronic systolic (congestive) heart failure: Secondary | ICD-10-CM | POA: Insufficient documentation

## 2018-04-08 DIAGNOSIS — I110 Hypertensive heart disease with heart failure: Secondary | ICD-10-CM | POA: Diagnosis not present

## 2018-04-08 DIAGNOSIS — Z79899 Other long term (current) drug therapy: Secondary | ICD-10-CM | POA: Diagnosis not present

## 2018-04-08 DIAGNOSIS — R05 Cough: Secondary | ICD-10-CM | POA: Diagnosis present

## 2018-04-08 MED ORDER — DOXYCYCLINE HYCLATE 100 MG PO CAPS: 100.0000 mg | ORAL_CAPSULE | Freq: Two times a day (BID) | ORAL | 0 refills | Status: DC

## 2018-04-08 NOTE — ED Notes (Signed)
Patient transported to X-ray 

## 2018-04-08 NOTE — ED Triage Notes (Addendum)
Pt has been having chest pain with a productive cough and chills for a week.

## 2018-04-08 NOTE — ED Notes (Signed)
Pt refused to remove all of her clothing for MD exam.

## 2018-04-08 NOTE — Discharge Instructions (Signed)
Use your pro-air (albuterol) inhaler 2 puffs every 4 hours as needed for shortness of breath.  See your primary care physician if not improving in 7 to 10 days.  Ask your primary care physician to help you to stop smoking

## 2018-04-08 NOTE — ED Provider Notes (Addendum)
[facility] EMERGENCY DEPARTMENT Provider Note   CSN: [id number] Arrival date & time: 04/08/18  3536     History   Chief Complaint Chief Complaint  Patient presents with  . Cough    HPI Melanie Cordova is a 58 y.o. female.  HPI Complains of cough for 1 week initially productive of clear sputum she reports this morning sputum turned yellow.  She complains of chest pain with coughing only which is sharp in nature, anterior she denies any fever or shortness of breath.  No treatment prior to coming here.  Chest pain worse with coughing and improved with remaining still.  She is presently comfortable.  No lightheadedness no fever.  No other associated symptoms Past Medical History:  Diagnosis Date  . Anxiety   . Arthritis   . Asthma   . Automatic implantable cardioverter-defibrillator in situ    2013, july  . Bell palsy    states has had 3 episodes  . Cardiomyopathy 08/2010   Presented with congestive heart failure; EF of 15% and 2012; hypotension on medication precludes optimal dosing  . CHF (congestive heart failure) ([place])    a. EF 20% in 2012 with low-risk NST --> St. Jude ICD implantation by Dr. Lovena Le in 09/2011  . Chronic systolic heart failure ([place])   . COPD (chronic obstructive pulmonary disease) ([place])    2013  . Dysrhythmia   . Gastroesophageal reflux disease   . Headache(784.0)   . Hot flashes 10/04/2016  . Hyperlipidemia   . Hypertension    09/2010-normal CMet and CBC; Lipid profile-116, 88, 25, 73  . Hypothyroidism    Recent TSH was normal.  . 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 Neuromuscular disorder (HCC)    Neuropathy, right foot and leg  . Neuropathy   . Obesity   . Pneumonia   . Seizures ([place])    last one at age 58  . Shortness of breath   . Stroke [facility])     stroke in 07/2012 and another in June, 2014  . Tobacco abuse    20 pack years    Patient Active Problem List   Diagnosis Date Noted  . Chest pain 12/28/2017  .  Rectocele 10/04/2016  . Vaginal discharge 10/04/2016  . Screening for colorectal cancer 10/04/2016  . Hot flashes 10/04/2016  . Vitamin D deficiency 02/26/2016  . Cryptogenic stroke ([place]) 08/13/2015  . Weakness of face muscles 07/25/2015  . Morbid obesity due to excess calories ([place]) 03/05/2015  . Rectal bleeding 11/05/2013  . Chronic systolic heart failure ([place]) 05/15/2013  . Asthma 04/29/2013  . Migraine variant 11/24/2012  . Weakness 11/15/2012  . Gingival disease 07/25/2012  . TIA (transient ischemic attack) 11/07/2011  . Automatic implantable cardioverter-defibrillator in situ 10/12/2011  . Hypokalemia 10/01/2011  . Tobacco abuse   . Hyperlipidemia   . Hypertension   . Gastroesophageal reflux disease   . Hypothyroidism   . Cardiomyopathy ([place]) 08/13/2010    Past Surgical History:  Procedure Laterality Date  . CESAREAN SECTION     X2  . COLONOSCOPY N/A 12/16/2013   Procedure: COLONOSCOPY;  Surgeon: Danie Binder, MD;  Location[facility];  Service: Endoscopy;  Laterality: N/A;  10:45-moved to 10/5 @ [place] notified pt  . EP IMPLANTABLE DEVICE     St. Jude  . EP IMPLANTABLE DEVICE N/A 08/13/2015   Procedure: Loop Recorder Insertion;  Surgeon: Evans Lance, MD;  Location: [place] CV LAB;  Service:  Cardiovascular;  Laterality: N/A;  . IMPLANTABLE CARDIOVERTER DEFIBRILLATOR IMPLANT N/A 10/10/2011   Procedure: IMPLANTABLE CARDIOVERTER DEFIBRILLATOR IMPLANT;  Surgeon: Evans Lance, MD;  Location: [facility] CATH LAB;  Service: Cardiovascular;  Laterality: N/A;  . MULTIPLE EXTRACTIONS WITH ALVEOLOPLASTY N/A 09/24/2012   Procedure: MULTIPLE EXTRACION #2, 4, 6, 7 ,8, 9, 11, 13, 18, 20, 21, 22, 23, 24, 25, 26, 27, 29 WITH ALVEOLOPLASTY, BIOPSY OF PALATE LESION, REMOVA RIGHT LINGUAL TORUS;  Surgeon: Gae Bon, DDS;  Location: [place];  Service: Oral Surgery;  Laterality: N/A;  . TEE WITHOUT CARDIOVERSION N/A 07/11/2012   Procedure: TRANSESOPHAGEAL ECHOCARDIOGRAM (TEE);   Surgeon: Thayer Headings, MD;  Location: [facility] ENDOSCOPY;  Service: Cardiovascular;  Laterality: N/A;  . TUBAL LIGATION       OB History    Gravida  3   Para  3   Term  3   Preterm      AB      Living  3     SAB      TAB      Ectopic      Multiple      Live Births  3            Home Medications    Prior to Admission medications   Medication Sig Start Date End Date Taking? Authorizing Provider  albuterol (PROVENTIL HFA;VENTOLIN HFA) 108 (90 BASE) MCG/ACT inhaler Inhale 2 puffs into the lungs every 6 (six) hours as needed for wheezing or shortness of breath. 07/12/12   Trinda Pascal, MD  aspirin EC 81 MG tablet Take 81 mg by mouth 2 (two) times daily.     [provider]  budesonide-formoterol (SYMBICORT) 80-4.5 MCG/ACT inhaler Inhale 2 puffs into the lungs 2 (two) times daily.    [provider]  carvedilol (COREG) 12.5 MG tablet Take 25 mg by mouth 2 (two) times daily with a meal.    [provider]  cetirizine (ZYRTEC) 10 MG tablet Take 10 mg by mouth daily.    [provider]  Cholecalciferol (VITAMIN D3) 5000 units CAPS TAKE ONE CAPSULE BY MOUTH DAILY Patient taking differently: Take 50,000 Units by mouth once a week.  09/22/17   [name], MD  clobetasol (TEMOVATE) 0.05 % external solution Apply 1 application topically 2 (two) times daily as needed (Apply to scalp.).  07/24/15   [provider]  diclofenac sodium (VOLTAREN) 1 % GEL Apply 2 g topically 4 (four) times daily as needed (pain).     [provider]  famotidine (PEPCID) 10 MG tablet Take 10 mg by mouth daily as needed for heartburn or indigestion.    [provider]  Ferrous Sulfate (IRON) 28 MG TABS Take 1 tablet by mouth daily.    [provider]  fluticasone (FLONASE) 50 MCG/ACT nasal spray Place 2 sprays into both nostrils daily as needed for allergies.     [provider]  furosemide (LASIX) 40 MG tablet Take  1 tablet (40 mg total) by mouth 2 (two) times daily. 08/29/12   Yehuda Savannah, MD  gabapentin (NEURONTIN) 800 MG tablet Take 800 mg by mouth 4 (four) times daily. 10/30/15   [provider]  ketoconazole (NIZORAL) 2 % cream Apply 1 application topically 2 (two) times daily.  10/21/15   [provider]  levothyroxine (SYNTHROID, LEVOTHROID) 200 MCG tablet Take 1 tablet (200 mcg total) by mouth daily before breakfast. 03/01/18   Nida, Marella Chimes, MD  Multiple  Vitamins-Minerals (CENTRAVITES 50 PLUS PO) Take by mouth daily.    [provider]  rosuvastatin (CRESTOR) 20 MG tablet Take 20 mg by mouth daily.    [provider]  sacubitril-valsartan (ENTRESTO) 24-26 MG Take 1 tablet by mouth 2 (two) times daily. 01/23/18   Strader, Fransisco Hertz, PA-C  Simethicone (GAS-X PO) Take by mouth as needed.    [provider]    Family History Family History  Problem Relation Age of Onset  . Cardiomyopathy Mother        ICD pacemaker-ischmic CM  . Heart failure Mother   . Hypertension Mother   . Diabetes Mother   . Thyroid disease Mother   . Cardiomyopathy Father        Deceased  . Coronary artery disease Father   . Heart failure Father   . Heart failure Brother   . Hypertension Brother   . Endometriosis Sister   . Heart disease Paternal Grandfather   . Heart disease Paternal Grandmother   . Heart disease Maternal Grandmother   . Heart disease Maternal Grandfather   . Hypertension Daughter   . Obesity Daughter   . Colon cancer Neg Hx   . Liver disease Neg Hx     Social History Social History   Tobacco Use  . Smoking status: Current Every Day Smoker    Packs/day: 0.50    Years: 21.00    Pack years: 10.50    Types: Cigarettes    Start date: 07/02/1983  . Smokeless tobacco: Never Used  . Tobacco comment: 5 cigs daily 11-26-12  Substance Use Topics  . Alcohol use: No    Alcohol/week: 0.0 standard drinks  . Drug use: No     Allergies     Fish allergy; Iodinated diagnostic agents; Iodine; Lentil; Penicillins; Lipitor [atorvastatin]; Spironolactone; and Sulfa antibiotics   Review of Systems Review of Systems  Constitutional: Negative.   HENT: Negative.   Respiratory: Positive for cough.   Cardiovascular: Positive for chest pain.  Gastrointestinal: Negative.   Musculoskeletal: Negative.   Skin: Negative.   Neurological: Negative.   Psychiatric/Behavioral: Negative.   All other systems reviewed and are negative.    Physical Exam Updated Vital Signs BP (!) 96/54 (BP Location: Left Arm)   Pulse 69   Temp 97.8 F (36.6 C) (Oral)   Resp 17   Ht 5\' 7"  (1.702 m)   Wt 124.7 kg   SpO2 96%   BMI 43.07 kg/m   Physical Exam Vitals signs and nursing note reviewed.  Constitutional:      Appearance: She is well-developed. She is obese.  HENT:     Head: Normocephalic and atraumatic.  Eyes:     Conjunctiva/sclera: Conjunctivae normal.     Pupils: Pupils are equal, round, and reactive to light.  Neck:     Musculoskeletal: Neck supple.     Thyroid: No thyromegaly.     Trachea: No tracheal deviation.  Cardiovascular:     Rate and Rhythm: Normal rate and regular rhythm.     Heart sounds: No murmur.  Pulmonary:     Effort: Pulmonary effort is normal. No respiratory distress.     Breath sounds: Rhonchi present.     Comments: Scant diffuse rhonchi Abdominal:     General: Bowel sounds are normal. There is no distension.     Palpations: Abdomen is soft.     Tenderness: There is no abdominal tenderness.  Musculoskeletal: Normal range of motion.  General: No tenderness.     Right lower leg: No edema.     Left lower leg: No edema.  Skin:    General: Skin is warm and dry.     Findings: No rash.  Neurological:     Mental Status: She is alert.     Coordination: Coordination normal.      ED Treatments / Results  Labs (all labs ordered are listed, but only abnormal results are displayed) Labs Reviewed - No  data to display  EKG EKG Interpretation  Date/Time:  Sunday April 08 2018 09:58:04 EST Ventricular Rate:  65 PR Interval:    QRS Duration: 86 QT Interval:  558 QTC Calculation: 581 R Axis:   -28 Text Interpretation:  Sinus rhythm Borderline left axis deviation Low voltage, precordial leads Abnormal R-wave progression, early transition Consider anterior infarct Prolonged QT interval No significant change since last tracing Confirmed by Orlie Dakin [phone]) on 04/08/2018 12:12:58 PM   Radiology No results found.  Procedures Procedures (including critical care time)  Medications Ordered in ED Medications - No data to display Chest x-ray viewed by me Results for orders placed or performed in visit on 04/02/18  CUP PACEART REMOTE DEVICE CHECK  Result Value Ref Range   Date Time Interrogation Session [id number]    Pulse Generator Manufacturer [facility]    Pulse Gen Model [id number] Reveal LINQ    Pulse Gen Serial Number [id number]    Clinic Name [facility]    Implantable Pulse Generator Type ICM/ILR    Implantable Pulse Generator Implant Date [id number]    Implantable Lead Manufacturer [facility]    Implantable Lead Model [id number] [employer] SG    Implantable Lead Serial Number [id number]    Implantable Lead Implant Date [id number]    Implantable Lead Location [id number]    Dg Chest 2 View  Result Date: 04/08/2018 CLINICAL DATA:  Chest pain and productive cough with chills. Duration: 1 week EXAM: CHEST - 2 VIEW COMPARISON:  12/28/2017 FINDINGS: Mild enlargement of the cardiopericardial silhouette, without edema. Atherosclerotic calcification of the aortic arch. AICD noted. Mild thoracic spondylosis. Lungs appear clear. No blunting of the costophrenic angles. Loop recorder noted. IMPRESSION: 1. Mild enlargement of the cardiopericardial silhouette, without edema. 2.  Aortic Atherosclerosis (ICD10-I70.0). Electronically Signed   By: Van Clines M.D.   On: 04/08/2018 13:24    Initial  Impression / Assessment and Plan / ED Course  I have reviewed the triage vital signs and the nursing notes.  Pertinent labs & imaging results that were available during my care of the patient were reviewed by me and considered in my medical decision making (see chart for details).     In light of patient's having implanted defibrillator and smoking and symptoms ongoing for 1 week we will treat with antibiotic plan prescription doxycycline.  To use pro-air inhaler 2 puffs every 4 hours as needed for shortness of breath.  Follow-up with PMD if not better in 10 days.  I counseled patient for 5 minutes on smoking cessation  Final Clinical Impressions(s) / ED Diagnoses  Diagnosis #1 acute bronchitis Final diagnoses:  None  #2 tobacco abuse  ED Discharge Orders    None       Orlie Dakin, MD 04/08/18 [place], [place], MD 04/08/18 1354

## 2018-05-07 ENCOUNTER — Ambulatory Visit (INDEPENDENT_AMBULATORY_CARE_PROVIDER_SITE_OTHER): Payer: Medicare Other | Admitting: *Deleted

## 2018-05-07 DIAGNOSIS — I639 Cerebral infarction, unspecified: Secondary | ICD-10-CM | POA: Diagnosis not present

## 2018-05-08 ENCOUNTER — Encounter: Payer: Medicare Other | Admitting: *Deleted

## 2018-05-08 LAB — CUP PACEART REMOTE DEVICE CHECK
Implantable Lead Location: 753860
Implantable Lead Model: 181
Implantable Lead Serial Number: 322336
MDC IDC LEAD IMPLANT DT: 20130729
MDC IDC PG IMPLANT DT: 20170601
MDC IDC SESS DTM: 20200223111318

## 2018-05-09 ENCOUNTER — Telehealth: Payer: Self-pay

## 2018-05-09 NOTE — Telephone Encounter (Signed)
Unable to leave a message for patient to remind of missed remote transmission.  

## 2018-05-15 NOTE — Progress Notes (Signed)
Carelink Summary Report / Loop Recorder 

## 2018-05-16 ENCOUNTER — Encounter: Payer: Self-pay | Admitting: Cardiology

## 2018-05-25 ENCOUNTER — Telehealth: Payer: Self-pay

## 2018-05-25 NOTE — Telephone Encounter (Signed)
Spoke with patient. Advised that manual ILR transmission from 05/24/18 at 21:01 and ICD transmission from 05/25/18 at 10:11 are both normal, no arrhythmias noted.   Pt reports left chest and shoulder burning/stinging/sharp pains yesterday evening, ongoing for awhile, eventually went to sleep still hurting. Took an 81mg  ASA. Initially started with activity, though no ShOB or other symptoms. She does report having a lot of gas and discomfort related to that over the past week or so, hasn't tried PRN simethicone yet. Taking other meds as prescribed.  Advised pt to try simethicone for her gas pains. Will route to Dr. Lovena Le and Sonia Baller, RN, for review and any other recommendations. ED precautions given for new or worsening cardiac symptoms.

## 2018-05-25 NOTE — Telephone Encounter (Signed)
Pt states she been having some strange feelings in her chest. Her chest felt like some pressure been on her chest for 2 days. Pt states she felt likes some tingling in her chest. Pt send a transmission in [place] and [employer]. She would like the nurse to take a look at it and give her a call back.

## 2018-05-29 ENCOUNTER — Telehealth: Payer: Self-pay

## 2018-05-29 NOTE — Telephone Encounter (Signed)
Apt made for pt to see Gerrianne Scale PA-C on 3/30 at 1:30 pm for cp, unable to reach patient no answer, no vm is set up

## 2018-05-29 NOTE — Telephone Encounter (Signed)
I spoke with patient, she will come to the apt on 3/30 and go to the ED if symptoms worsen

## 2018-05-29 NOTE — Telephone Encounter (Signed)
-----   Message from Damian Leavell, RN sent at 05/28/2018  4:39 PM EDT ----- I would leave her on for her yearly due in June with Dr. Lovena Le unless Sharyn Lull thinks she needs to be seen after her.  Thank you.

## 2018-06-01 NOTE — Telephone Encounter (Signed)
Agree with above. She has a h/o chest pain. Agree with rec's regarding ED only for pain as below. GT

## 2018-06-05 ENCOUNTER — Telehealth: Payer: Self-pay | Admitting: Physician Assistant

## 2018-06-05 NOTE — Telephone Encounter (Signed)
Patient returned call. She stated she is not having any problems at this time. She is ok with rescheduling appt. She is reschedule with [place].

## 2018-06-05 NOTE — Telephone Encounter (Signed)
I tried calling patient about her upcoming appointment with me 06/11/2018 because of the recent corona virus outbreak.  Patient's voicemail is not set up to leave a message and she did not answer.  Please try to reach out to her before she comes on Monday and try to reschedule if she is not having problems or offer WebEx or phone call appointment.  This patient has seen [place] twice recently so it would probably be best if she were placed on Brittany's scheduled.

## 2018-06-08 ENCOUNTER — Ambulatory Visit (INDEPENDENT_AMBULATORY_CARE_PROVIDER_SITE_OTHER): Payer: Medicare Other | Admitting: *Deleted

## 2018-06-08 DIAGNOSIS — I639 Cerebral infarction, unspecified: Secondary | ICD-10-CM | POA: Diagnosis not present

## 2018-06-08 LAB — CUP PACEART REMOTE DEVICE CHECK
Implantable Lead Serial Number: 322336
Implantable Pulse Generator Implant Date: 20170601
MDC IDC LEAD IMPLANT DT: 20130729
MDC IDC LEAD LOCATION: 753860
MDC IDC SESS DTM: 20200327111433

## 2018-06-11 ENCOUNTER — Ambulatory Visit: Payer: Medicare Other | Admitting: Physician Assistant

## 2018-06-11 NOTE — Progress Notes (Signed)
Carelink Summary Report / Loop Recorder 

## 2018-06-19 ENCOUNTER — Telehealth: Payer: Self-pay

## 2018-06-19 NOTE — Telephone Encounter (Signed)
I spoke with the pt and she agreed to send one as soon as possible.

## 2018-06-21 ENCOUNTER — Telehealth: Payer: Self-pay | Admitting: Student

## 2018-06-21 MED ORDER — PANTOPRAZOLE SODIUM 20 MG PO TBEC: 20.0000 mg | DELAYED_RELEASE_TABLET | Freq: Every day | ORAL | 1 refills | Status: DC

## 2018-06-21 NOTE — Telephone Encounter (Signed)
Spoke w/ pt and requested that she send a manual transmission w/ her loop recorder home monitor. Transmission received for loop recorder.

## 2018-06-21 NOTE — Telephone Encounter (Signed)
Patient does not take Pepcid.She will try Protonix 20 mg daily and understands to go to the ED if symptoms worsen with exertion or pain becomes different than current symptoms.

## 2018-06-21 NOTE — Telephone Encounter (Signed)
   Symptoms do have somewhat atypical cardiac qualities in lasting all day and improving with belching. Is she still just taking Pepcid for heartburn? Would try Protonix 20mg  daily for this (can send in Rx). She had a reassuring low-risk NST in 12/2017 which showed no current ischemia but if she develops worsening pain with exertion or pain feels different from her current symptoms, would recommend ED evaluation.   Signed, Erma Heritage, PA-C 06/21/2018, 12:51 PM Pager: [phone]

## 2018-06-21 NOTE — Telephone Encounter (Signed)
Manual loop recorder transmission reviewed--no abnormalities noted. Requested manual [place] transmission to review any ICD episodes. Pt verbalizes understanding and will send now.

## 2018-06-21 NOTE — Telephone Encounter (Signed)
No recent episodes noted on loop recorder, last update on 06/21/18 at 04:50. False AF detection on 06/04/18 (sinus rhythm w/PVCs). We have not received a manual ICD transmission from patient, though monitor is up to date as of 06/18/18 and no events have transmitted.  Pamala Hurry or [place], please request a manual ICD transmission for review. Routed to [facility] triage for further assessment/recommendations regarding chest pain.

## 2018-06-21 NOTE — Telephone Encounter (Signed)
Patient called two days ago for the same reason.Talked with device clinic and was asked to send transmission. She states has not heard back regarding transmission.Today, she says her face is "drawn" with her Bell's palsy and she is still having the "strange" sensations in her chest. These symptoms of something "going on in my chest" started last Saturday, lasted all day and occurred at both rest and when she does her chores. Denies SOB, N/V. Does not take NTG, states "they have me on aspirin 81 mg twice a day" She states she is quite sure her ICD did not fire. I told her she probably needs to talk with the device clinic and she said she would.      I will FYI them

## 2018-06-21 NOTE — Telephone Encounter (Signed)
I just spoke with patient again and she reports dull pain in chest, not as sharp as it has been. Patient has had a lot of gas all week, eating beans this week.Burping has helped.   I will forward to B.Strader PA-C

## 2018-06-21 NOTE — Telephone Encounter (Signed)
Pt c/o of Chest Pain:  1. Are you having CP right now?  Yes  (scale of 1-10)  States it is a 7  2. Are you experiencing any other symptoms (ex. SOB, nausea, vomiting, sweating) no  3. How long have you been experiencing CP?  Off and on since Saturday.  4. Is your CP continuous or coming and going? Both  5. Have you taken Nitroglycerin? No

## 2018-06-21 NOTE — Telephone Encounter (Signed)
Manual ICD transmission received and reviewed. Normal device function. Presenting rhythm Vs @ 88bpm. No VT/VF episodes. Histograms appropriate. CorVue stable. Lead measurements stable.  Pt made aware of normal device function. She denies additional device-related questions or concerns at this time and thanked me for my call.

## 2018-06-28 NOTE — Telephone Encounter (Signed)
Transmission received 06/22/2018

## 2018-07-02 ENCOUNTER — Ambulatory Visit: Payer: Medicare Other | Admitting: Cardiology

## 2018-07-04 ENCOUNTER — Telehealth: Payer: Self-pay | Admitting: Student

## 2018-07-04 NOTE — Telephone Encounter (Signed)
Virtual Visit Pre-Appointment Phone Call  "(Name), I am calling you today to discuss your upcoming appointment. We are currently trying to limit exposure to the virus that causes COVID-19 by seeing patients at home rather than in the office."  1. "What is the BEST phone number to call the day of the visit?" - include this in appointment notes  2. Do you have or have access to (through a family member/friend) a smartphone with video capability that we can use for your visit?" a. If yes - list this number in appt notes as cell (if different from BEST phone #) and list the appointment type as a VIDEO visit in appointment notes b. If no - list the appointment type as a PHONE visit in appointment notes  3. Confirm consent - "In the setting of the current Covid19 crisis, you are scheduled for a (phone or video) visit with your provider on (date) at (time).  Just as we do with many in-office visits, in order for you to participate in this visit, we must obtain consent.  If you'd like, I can send this to your mychart (if signed up) or email for you to review.  Otherwise, I can obtain your verbal consent now.  All virtual visits are billed to your insurance company just like a normal visit would be.  By agreeing to a virtual visit, we'd like you to understand that the technology does not allow for your provider to perform an examination, and thus may limit your provider's ability to fully assess your condition. If your provider identifies any concerns that need to be evaluated in person, we will make arrangements to do so.  Finally, though the technology is pretty good, we cannot assure that it will always work on either your or our end, and in the setting of a video visit, we may have to convert it to a phone-only visit.  In either situation, we cannot ensure that we have a secure connection.  Are you willing to proceed?" STAFF: Did the patient verbally acknowledge consent to telehealth visit? Document  YES/NO here: Yes  4. Advise patient to be prepared - "Two hours prior to your appointment, go ahead and check your blood pressure, pulse, oxygen saturation, and your weight (if you have the equipment to check those) and write them all down. When your visit starts, your provider will ask you for this information. If you have an Apple Watch or Kardia device, please plan to have heart rate information ready on the day of your appointment. Please have a pen and paper handy nearby the day of the visit as well."  5. Give patient instructions for MyChart download to smartphone OR [email or web address] as below if video visit (depending on what platform provider is using)  6. Inform patient they will receive a phone call 15 minutes prior to their appointment time (may be from unknown caller ID) so they should be prepared to answer    Melanie Cordova has been deemed a candidate for a follow-up tele-health visit to limit community exposure during the Covid-19 pandemic. I spoke with the patient via phone to ensure availability of phone/video source, confirm preferred email & phone number, and discuss instructions and expectations.  I reminded Melanie Cordova to be prepared with any vital sign and/or heart rhythm information that could potentially be obtained via home monitoring, at the time of her visit. I reminded Melanie Cordova to expect a phone call prior to  her visit.  Melanie Cordova 07/04/2018 1:37 PM

## 2018-07-11 ENCOUNTER — Other Ambulatory Visit: Payer: Self-pay

## 2018-07-11 ENCOUNTER — Ambulatory Visit (INDEPENDENT_AMBULATORY_CARE_PROVIDER_SITE_OTHER): Payer: Medicare Other | Admitting: *Deleted

## 2018-07-11 DIAGNOSIS — I429 Cardiomyopathy, unspecified: Secondary | ICD-10-CM

## 2018-07-11 DIAGNOSIS — I639 Cerebral infarction, unspecified: Secondary | ICD-10-CM | POA: Diagnosis not present

## 2018-07-11 LAB — CUP PACEART REMOTE DEVICE CHECK
Date Time Interrogation Session: 20200429121110
Implantable Lead Implant Date: 20130729
Implantable Lead Location: 753860
Implantable Lead Model: 181
Implantable Lead Serial Number: 322336
Implantable Pulse Generator Implant Date: 20170601

## 2018-07-12 ENCOUNTER — Encounter: Payer: Self-pay | Admitting: Student

## 2018-07-12 ENCOUNTER — Telehealth (HOSPITAL_COMMUNITY): Payer: Self-pay | Admitting: Licensed Clinical Social Worker

## 2018-07-12 ENCOUNTER — Telehealth (INDEPENDENT_AMBULATORY_CARE_PROVIDER_SITE_OTHER): Payer: Medicare Other | Admitting: Student

## 2018-07-12 VITALS — Ht 67.0 in | Wt 283.0 lb

## 2018-07-12 DIAGNOSIS — R002 Palpitations: Secondary | ICD-10-CM

## 2018-07-12 DIAGNOSIS — Z7189 Other specified counseling: Secondary | ICD-10-CM

## 2018-07-12 DIAGNOSIS — Z720 Tobacco use: Secondary | ICD-10-CM

## 2018-07-12 DIAGNOSIS — E785 Hyperlipidemia, unspecified: Secondary | ICD-10-CM

## 2018-07-12 DIAGNOSIS — I5042 Chronic combined systolic (congestive) and diastolic (congestive) heart failure: Secondary | ICD-10-CM | POA: Diagnosis not present

## 2018-07-12 DIAGNOSIS — I10 Essential (primary) hypertension: Secondary | ICD-10-CM

## 2018-07-12 DIAGNOSIS — I428 Other cardiomyopathies: Secondary | ICD-10-CM

## 2018-07-12 NOTE — Patient Instructions (Signed)
Medication Instructions:  Your physician recommends that you continue on your current medications as directed. Please refer to the Current Medication list given to you today.   Labwork: NONE  Testing/Procedures: NONE  Follow-Up: Your physician recommends that you schedule a follow-up appointment in: July with Dr. Lovena Le   Any Other Special Instructions Will Be Listed Below (If Applicable).  Your physician has requested that you regularly monitor and record your blood pressure and heart rate readings at home when you are able to do so. Please use the same machine at the same time of day to check your readings and record them to bring to your follow-up visit.     If you need a refill on your cardiac medications before your next appointment, please call your pharmacy.  Thank you for choosing [facility]!

## 2018-07-12 NOTE — Progress Notes (Signed)
Virtual Visit via Telephone Note   This visit type was conducted due to national recommendations for restrictions regarding the COVID-19 Pandemic (e.g. social distancing) in an effort to limit this patient's exposure and mitigate transmission in our community.  Due to her co-morbid illnesses, this patient is at least at moderate risk for complications without adequate follow up.  This format is felt to be most appropriate for this patient at this time.  The patient did not have access to video technology/had technical difficulties with video requiring transitioning to audio format only (telephone).  All issues noted in this document were discussed and addressed.  No physical exam could be performed with this format.  Please refer to the patient's chart for her  consent to telehealth for [facility]   Evaluation Performed:  Follow-up visit  Date:  07/12/2018   ID:  Melanie Cordova, DOB 15-Nov-1960, MRN [id number]  Patient Location: Home Provider Location: Home  PCP:  Rosita Fire, MD  Cardiologist:  [name], MD  Electrophysiologist:  None   Chief Complaint: Follow-up; Recent Episodes of Chest Pain  History of Present Illness:    Melanie Cordova is a 58 y.o. female with past medical history of presumed nonischemic cardiomyopathy (EF 20% in 2012 with low-risk NST --> St. Jude ICD implantation by Dr. Ashley Mariner 09/2011), chronic combined systolic and diastolic CHF (EF 99-24% by echo in 2015, at 35-40% by repeat imaging in 12/2017), HTN, HLD, COPD, prior CVA (ILR in place), and tobacco use.   She was last examined by myself in 02/2018 and denied any recent chest pain or dyspnea on exertion at that time. Was continued on her current medication regimen at that time, including Coreg 25mg  BID, Lasix 40mg  BID, and Entresto 24-26mg  BID.   In the interim, she reported having episodes of pressure in her chest in 05/2018. Remote device interrogation showed no recent episodes. Was  scheduled for follow-up in 05/2018 but this was cancelled due to COVID-19. She called back on 06/21/2018 reporting a dull ache along her chest which was improving with belching. No association with exertion. Given recent reassuring NST, was started on Protonix 20mg  daily but informed to go to the ED if symptoms worsened.   In talking with the patient today, she reports having occasional "electrical shock" sensations across her chest that she describes as feeling as if she put her finger in an electrical socket. This typically lasts for 30 seconds or less than resolves. Can occur at rest or with activity. Device has been interrogated following these episodes with no significant findings. Denies any specific chest pressure. Has baseline dyspnea on exertion but no recent orthopnea or PND. She reports occasional dizziness but does not have a way to check her HR or BP at home.   The patient does not have symptoms concerning for COVID-19 infection (fever, chills, cough, or new shortness of breath).    Past Medical History:  Diagnosis Date  . Anxiety   . Arthritis   . Asthma   . Automatic implantable cardioverter-defibrillator in situ    2013, july  . Bell palsy    states has had 3 episodes  . Cardiomyopathy 08/2010   Presented with congestive heart failure; EF of 15% and 2012; hypotension on medication precludes optimal dosing  . CHF (congestive heart failure) ([place])    a. EF 20% in 2012 with low-risk NST --> St. Jude ICD implantation by Dr. Lovena Le in 09/2011  . Chronic systolic heart failure ([place])   .  COPD (chronic obstructive pulmonary disease) ([place])    2013  . Dysrhythmia   . Gastroesophageal reflux disease   . Headache(784.0)   . Hot flashes 10/04/2016  . Hyperlipidemia   . Hypertension    09/2010-normal CMet and CBC; Lipid profile-116, 88, 25, 73  . Hypothyroidism    Recent TSH was normal.  . 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 Neuromuscular disorder (HCC)     Neuropathy, right foot and leg  . Neuropathy   . Obesity   . Pneumonia   . Seizures ([place])    last one at age 50  . Shortness of breath   . Stroke [facility])     stroke in 07/2012 and another in June, 2014  . Tobacco abuse    20 pack years   Past Surgical History:  Procedure Laterality Date  . CESAREAN SECTION     X2  . COLONOSCOPY N/A 12/16/2013   Procedure: COLONOSCOPY;  Surgeon: Danie Binder, MD;  [facility];  Service: Endoscopy;  Laterality: N/A;  10:45-moved to 10/5 @ [place] notified pt  . EP IMPLANTABLE DEVICE     St. Jude  . EP IMPLANTABLE DEVICE N/A 08/13/2015   Procedure: Loop Recorder Insertion;  Surgeon: Evans Lance, MD;  Location: [place] CV LAB;  Service: Cardiovascular;  Laterality: N/A;  . IMPLANTABLE CARDIOVERTER DEFIBRILLATOR IMPLANT N/A 10/10/2011   Procedure: IMPLANTABLE CARDIOVERTER DEFIBRILLATOR IMPLANT;  Surgeon: Evans Lance, MD;  Location: [facility] CATH LAB;  Service: Cardiovascular;  Laterality: N/A;  . MULTIPLE EXTRACTIONS WITH ALVEOLOPLASTY N/A 09/24/2012   Procedure: MULTIPLE EXTRACION #2, 4, 6, 7 ,8, 9, 11, 13, 18, 20, 21, 22, 23, 24, 25, 26, 27, 29 WITH ALVEOLOPLASTY, BIOPSY OF PALATE LESION, REMOVA RIGHT LINGUAL TORUS;  Surgeon: Gae Bon, DDS;  Location: [place];  Service: Oral Surgery;  Laterality: N/A;  . TEE WITHOUT CARDIOVERSION N/A 07/11/2012   Procedure: TRANSESOPHAGEAL ECHOCARDIOGRAM (TEE);  Surgeon: Thayer Headings, MD;  Location: [place];  Service: Cardiovascular;  Laterality: N/A;  . TUBAL LIGATION       Current Meds  Medication Sig  . albuterol (PROVENTIL HFA;VENTOLIN HFA) 108 (90 BASE) MCG/ACT inhaler Inhale 2 puffs into the lungs every 6 (six) hours as needed for wheezing or shortness of breath.  Marland Kitchen aspirin EC 81 MG tablet Take 81 mg by mouth 2 (two) times daily.   . Biotin 5000 MCG TABS Take by mouth.  . budesonide-formoterol (SYMBICORT) 80-4.5 MCG/ACT inhaler Inhale 2 puffs into the lungs 2 (two) times daily.  .  carvedilol (COREG) 12.5 MG tablet Take 25 mg by mouth 2 (two) times daily with a meal.  . cetirizine (ZYRTEC) 10 MG tablet Take 10 mg by mouth daily.  . Cholecalciferol (VITAMIN D3) 5000 units CAPS TAKE ONE CAPSULE BY MOUTH DAILY (Patient taking differently: Take 50,000 Units by mouth once a week. )  . clobetasol (TEMOVATE) 0.05 % external solution Apply 1 application topically 2 (two) times daily as needed (Apply to scalp.).   Marland Kitchen diclofenac sodium (VOLTAREN) 1 % GEL Apply 2 g topically 4 (four) times daily as needed (pain).   . Ferrous Sulfate (IRON) 28 MG TABS Take 1 tablet by mouth daily.  . fluticasone (FLONASE) 50 MCG/ACT nasal spray Place 2 sprays into both nostrils daily as needed for allergies.   . furosemide (LASIX) 40 MG tablet Take 1 tablet (40 mg total) by mouth 2 (two) times daily.  Marland Kitchen gabapentin (NEURONTIN) 800 MG tablet  Take 800 mg by mouth 4 (four) times daily.  Marland Kitchen ketoconazole (NIZORAL) 2 % cream Apply 1 application topically 2 (two) times daily.   Marland Kitchen levothyroxine (SYNTHROID, LEVOTHROID) 200 MCG tablet Take 1 tablet (200 mcg total) by mouth daily before breakfast.  . Multiple Vitamins-Minerals (CENTRAVITES 50 PLUS PO) Take by mouth daily.  . pantoprazole (PROTONIX) 20 MG tablet Take 1 tablet (20 mg total) by mouth daily.  . potassium chloride SA (K-DUR) 20 MEQ tablet   . rosuvastatin (CRESTOR) 20 MG tablet Take 20 mg by mouth daily.  . sacubitril-valsartan (ENTRESTO) 24-26 MG Take 1 tablet by mouth 2 (two) times daily.  . Simethicone (GAS-X PO) Take by mouth as needed.     Allergies:   Fish allergy; Iodinated diagnostic agents; Iodine; Lentil; Penicillins; Lipitor [atorvastatin]; Spironolactone; and Sulfa antibiotics   Social History   Tobacco Use  . Smoking status: Current Every Day Smoker    Packs/day: 0.50    Years: 21.00    Pack years: 10.50    Types: Cigarettes    Start date: 07/02/1983  . Smokeless tobacco: Never Used  . Tobacco comment: 5 cigs daily 11-26-12   Substance Use Topics  . Alcohol use: No    Alcohol/week: 0.0 standard drinks  . Drug use: No     Family Hx: The patient's family history includes Cardiomyopathy in her father and mother; Coronary artery disease in her father; Diabetes in her mother; Endometriosis in her sister; Heart disease in her maternal grandfather, maternal grandmother, paternal grandfather, and paternal grandmother; Heart failure in her brother, father, and mother; Hypertension in her brother, daughter, and mother; Obesity in her daughter; Thyroid disease in her mother. There is no history of Colon cancer or Liver disease.  ROS:   Please see the history of present illness.     All other systems reviewed and are negative.   Prior CV studies:   The following studies were reviewed today:  Echocardiogram: 12/2017 Study Conclusions  - Left ventricle: The cavity size was moderately dilated. Wall   thickness was increased in a pattern of moderate LVH. Systolic   function was moderately reduced. The estimated ejection fraction   was in the range of 35% to 40%. Diffuse hypokinesis. Doppler   parameters are consistent with abnormal left ventricular   relaxation (grade 1 diastolic dysfunction). - Aortic valve: Moderately calcified annulus. Trileaflet;   moderately thickened leaflets. Valve area (VTI): 1.59 cm^2. - Mitral valve: Moderately calcified annulus. Normal thickness   leaflets . - Atrial septum: No defect or patent foramen ovale was identified. - Technically adequate study.  NST: 12/2017  There was no ST segment deviation noted during stress.  Findings consistent with prior apical, basal septal, and inferolateral myocardial infarction. There is no current ishemia.  This is a high risk study. Risk based on decreased LVEF, there is no myocardium currently at jeopardy.  The left ventricular ejection fraction is moderately decreased (30-44%).  Labs/Other Tests and Data Reviewed:    EKG:  No ECG  reviewed.  Recent Labs: 12/29/2017: ALT 16; Hemoglobin 13.4; Magnesium 2.1; Platelets 248 02/06/2018: BUN 9; Creat 0.70; Potassium 3.5; Sodium 141 02/22/2018: TSH 1.65   Recent Lipid Panel Lab Results  Component Value Date/Time   CHOL 145 12/29/2017 04:42 AM   TRIG 196 (H) 12/29/2017 04:42 AM   HDL 34 (L) 12/29/2017 04:42 AM   CHOLHDL 4.3 12/29/2017 04:42 AM   LDLCALC 72 12/29/2017 04:42 AM    Wt Readings from Last 3 Encounters:  07/12/18 283 lb (128.4 kg)  04/08/18 275 lb (124.7 kg)  03/13/18 282 lb (127.9 kg)     Objective:    Vital Signs:  Ht 5\' 7"  (1.702 m)   Wt 283 lb (128.4 kg)   BMI 44.32 kg/m    General: Pleasant female sounding in NAD Psych: Normal affect. Neuro: Alert and oriented X 3. Lungs:  Resp regular and unlabored while talking on the phone.   ASSESSMENT & PLAN:    1. Chronic Combined Systolic and Diastolic CHF - Known reduced EF by 35-40% recent echo in 12/2017. She has baseline dyspnea on exertion but denies any recent orthopnea, PND, or edema.  - continue current regimen with Coreg 25mg  BID, Lasix 40mg  BID, and Entresto 24-26mg  BID.  2. Nonischemic Cardiomyopathy - s/p St. Jude ICD implantation. Device followed by Dr. Lovena Le. Continue BB and Entresto.   3. HTN - Will see if she is able to obtain a BP cuff as outlined below.  - continue current doses of Coreg and Entresto.   4. HLD - followed by PCP. LDL at 72 when checked in 12/2017. Remains on Crestor 20mg  daily.   5. Palpitations - her "electrical shock" sensations seem most concerning for an arrhythmia. Recent ICD interrogation showed no acute findings but in looking at her ILR interrogation, she does have frequent PVC's with occasional episodes of trigeminy and perhaps this is what she is feeling.  - I will [facility] Social Worker to see if we can get the patient a BP cuff. If BP allows, would further titrate Coreg but SBP has been in the 90's to 110's when  checked in the office on her most recent visits.   6. Tobacco Use - Cessation advised. She continues to smoke 0.5 ppd.   7. COVID-19 Education - The signs and symptoms of COVID-19 were discussed with the patient. The importance of social distancing was discussed today.  Time:   Today, I have spent 28 minutes with the patient with telehealth technology discussing the above problems.     Medication Adjustments/Labs and Tests Ordered: Current medicines are reviewed at length with the patient today.  Concerns regarding medicines are outlined above.   Tests Ordered: No orders of the defined types were placed in this encounter.   Medication Changes: No orders of the defined types were placed in this encounter.   Disposition:  Keep scheduled follow-up with Dr. Lovena Le  Signed, Erma Heritage, PA-C  07/12/2018 7:59 PM    [place]

## 2018-07-12 NOTE — Telephone Encounter (Signed)
CSW referred to assist patient with obtaining a BP cuff. CSW contacted patient to inform cuff will be delivered to her home next week. Patient grateful for support and assistance. CSW available as needed. Raquel Sarna, [place], [place]

## 2018-07-20 NOTE — Progress Notes (Signed)
Carelink Summary Report / Loop Recorder 

## 2018-08-13 ENCOUNTER — Ambulatory Visit (INDEPENDENT_AMBULATORY_CARE_PROVIDER_SITE_OTHER): Payer: Medicare Other | Admitting: *Deleted

## 2018-08-13 DIAGNOSIS — I639 Cerebral infarction, unspecified: Secondary | ICD-10-CM | POA: Diagnosis not present

## 2018-08-13 DIAGNOSIS — I428 Other cardiomyopathies: Secondary | ICD-10-CM

## 2018-08-14 LAB — CUP PACEART REMOTE DEVICE CHECK
Date Time Interrogation Session: 20200602070842
Date Time Interrogation Session: 20200602084416
Date Time Interrogation Session: 20200601141017
Date Time Interrogation Session: 20200601141017
Implantable Lead Implant Date: 20130729
Implantable Lead Implant Date: 20130729
Implantable Lead Implant Date: 20130729
Implantable Lead Implant Date: 20130729
Implantable Lead Location: 753860
Implantable Lead Location: 753860
Implantable Lead Location: 753860
Implantable Lead Location: 753860
Implantable Lead Model: 181
Implantable Lead Model: 181
Implantable Lead Model: 181
Implantable Lead Model: 181
Implantable Lead Serial Number: 322336
Implantable Lead Serial Number: 322336
Implantable Lead Serial Number: 322336
Implantable Lead Serial Number: 322336
Implantable Pulse Generator Implant Date: 20130729
Implantable Pulse Generator Implant Date: 20130729
Implantable Pulse Generator Implant Date: 20170601
Implantable Pulse Generator Implant Date: 20170601
Pulse Gen Serial Number: 1022442
Pulse Gen Serial Number: 1022442

## 2018-08-21 ENCOUNTER — Encounter: Payer: Self-pay | Admitting: Cardiology

## 2018-08-21 NOTE — Progress Notes (Signed)
Carelink Summary Report / Loop Recorder 

## 2018-08-21 NOTE — Progress Notes (Signed)
Remote ICD transmission.   

## 2018-09-03 ENCOUNTER — Ambulatory Visit: Payer: Self-pay | Admitting: "Endocrinology

## 2018-09-03 ENCOUNTER — Ambulatory Visit: Payer: Medicare Other | Admitting: Nutrition

## 2018-09-04 ENCOUNTER — Ambulatory Visit: Payer: Medicare Other | Admitting: Nutrition

## 2018-09-05 ENCOUNTER — Telehealth: Payer: Self-pay

## 2018-09-05 DIAGNOSIS — E038 Other specified hypothyroidism: Secondary | ICD-10-CM

## 2018-09-05 NOTE — Telephone Encounter (Signed)
Melanie Cordova, CMA  

## 2018-09-06 LAB — TSH: TSH: 1.44 mIU/L (ref 0.40–4.50)

## 2018-09-06 LAB — T4, FREE: Free T4: 1.4 ng/dL (ref 0.8–1.8)

## 2018-09-17 ENCOUNTER — Ambulatory Visit (INDEPENDENT_AMBULATORY_CARE_PROVIDER_SITE_OTHER): Payer: Medicare Other | Admitting: *Deleted

## 2018-09-17 DIAGNOSIS — I639 Cerebral infarction, unspecified: Secondary | ICD-10-CM | POA: Diagnosis not present

## 2018-09-17 LAB — CUP PACEART REMOTE DEVICE CHECK
Date Time Interrogation Session: 20200704143939
Implantable Lead Implant Date: 20130729
Implantable Lead Location: 753860
Implantable Lead Model: 181
Implantable Lead Serial Number: 322336
Implantable Pulse Generator Implant Date: 20170601

## 2018-09-28 NOTE — Progress Notes (Signed)
Carelink Summary Report / Loop Recorder 

## 2018-10-03 ENCOUNTER — Encounter: Payer: Medicare Other | Admitting: Internal Medicine

## 2018-10-05 ENCOUNTER — Other Ambulatory Visit (HOSPITAL_COMMUNITY): Payer: Self-pay | Admitting: Internal Medicine

## 2018-10-05 DIAGNOSIS — Z1231 Encounter for screening mammogram for malignant neoplasm of breast: Secondary | ICD-10-CM

## 2018-10-18 ENCOUNTER — Ambulatory Visit (INDEPENDENT_AMBULATORY_CARE_PROVIDER_SITE_OTHER): Payer: Medicare Other | Admitting: *Deleted

## 2018-10-18 DIAGNOSIS — I639 Cerebral infarction, unspecified: Secondary | ICD-10-CM

## 2018-10-18 LAB — CUP PACEART REMOTE DEVICE CHECK
Date Time Interrogation Session: 20200806140825
Implantable Lead Implant Date: 20130729
Implantable Lead Location: 753860
Implantable Lead Model: 181
Implantable Lead Serial Number: 322336
Implantable Pulse Generator Implant Date: 20170601

## 2018-10-19 ENCOUNTER — Ambulatory Visit (HOSPITAL_COMMUNITY)
Admission: RE | Admit: 2018-10-19 | Discharge: 2018-10-19 | Disposition: A | Discharge status: Home / Self Care | Payer: Medicare Other | Source: Ambulatory Visit | Attending: Internal Medicine | Admitting: Internal Medicine

## 2018-10-19 ENCOUNTER — Other Ambulatory Visit: Payer: Self-pay

## 2018-10-19 DIAGNOSIS — Z1231 Encounter for screening mammogram for malignant neoplasm of breast: Secondary | ICD-10-CM | POA: Diagnosis present

## 2018-10-23 NOTE — Progress Notes (Signed)
Carelink Summary Report / Loop Recorder 

## 2018-11-06 ENCOUNTER — Encounter: Payer: Self-pay | Admitting: Internal Medicine

## 2018-11-06 ENCOUNTER — Ambulatory Visit (INDEPENDENT_AMBULATORY_CARE_PROVIDER_SITE_OTHER): Payer: Medicare Other | Admitting: Internal Medicine

## 2018-11-06 VITALS — BP 128/76 | HR 80 | Temp 97.1°F | Ht 67.5 in | Wt 290.0 lb

## 2018-11-06 DIAGNOSIS — I5022 Chronic systolic (congestive) heart failure: Secondary | ICD-10-CM

## 2018-11-06 LAB — CUP PACEART INCLINIC DEVICE CHECK
Battery Remaining Longevity: 36 mo
Brady Statistic RV Percent Paced: 0 %
Date Time Interrogation Session: 20200825092053
Date Time Interrogation Session: 20200825092310
HighPow Impedance: 84.375
Implantable Lead Implant Date: 20130729
Implantable Lead Implant Date: 20130729
Implantable Lead Location: 753860
Implantable Lead Location: 753860
Implantable Lead Model: 181
Implantable Lead Model: 181
Implantable Lead Serial Number: 322336
Implantable Lead Serial Number: 322336
Implantable Pulse Generator Implant Date: 20130729
Implantable Pulse Generator Implant Date: 20170601
Lead Channel Impedance Value: 637.5 Ohm
Lead Channel Pacing Threshold Amplitude: 0.75 V
Lead Channel Pacing Threshold Amplitude: 0.75 V
Lead Channel Pacing Threshold Pulse Width: 0.5 ms
Lead Channel Pacing Threshold Pulse Width: 0.5 ms
Lead Channel Sensing Intrinsic Amplitude: 12 mV
Lead Channel Setting Pacing Amplitude: 2.5 V
Lead Channel Setting Pacing Pulse Width: 0.5 ms
Lead Channel Setting Sensing Sensitivity: 0.5 mV
Pulse Gen Serial Number: 1022442

## 2018-11-06 NOTE — Progress Notes (Signed)
HPI Melanie Cordova returns today for followup. She is a 58 yo woman with chronic systolic heart failure, s/p ICD insertion, morbid obesity, HTN, and bronchitis. She has been under stress regarding her monthly bills. She has lost many members of her family.  Allergies  Allergen Reactions  . Fish Allergy Anaphylaxis  . Iodinated Diagnostic Agents Hives and Other (See Comments)    Pulmonary problems; no frank respiratory arrest  . Iodine Hives and Other (See Comments)    Pulmonary Problems   . Lentil Anaphylaxis  . Penicillins Anaphylaxis and Shortness Of Breath    Has patient had a PCN reaction causing immediate rash, facial/tongue/throat swelling, SOB or lightheadedness with hypotension: Yes Has patient had a PCN reaction causing severe rash involving mucus membranes or skin necrosis: No Has patient had a PCN reaction that required hospitalization No Has patient had a PCN reaction occurring within the last 10 years: No If all of the above answers are "NO", then may proceed with Cephalosporin use.  Hair loss  . Lipitor [Atorvastatin] Itching and Rash  . Spironolactone Rash  . Sulfa Antibiotics Other (See Comments)    Unknown     Current Outpatient Medications  Medication Sig Dispense Refill  . albuterol (PROVENTIL HFA;VENTOLIN HFA) 108 (90 BASE) MCG/ACT inhaler Inhale 2 puffs into the lungs every 6 (six) hours as needed for wheezing or shortness of breath. 1 Inhaler 5  . aspirin EC 81 MG tablet Take 81 mg by mouth 2 (two) times daily.     . Biotin 5000 MCG TABS Take by mouth.    . budesonide-formoterol (SYMBICORT) 80-4.5 MCG/ACT inhaler Inhale 2 puffs into the lungs 2 (two) times daily.    . carvedilol (COREG) 12.5 MG tablet Take 25 mg by mouth 2 (two) times daily with a meal.    . cetirizine (ZYRTEC) 10 MG tablet Take 10 mg by mouth daily.    . Cholecalciferol (VITAMIN D3) 5000 units CAPS TAKE ONE CAPSULE BY MOUTH DAILY (Patient taking differently: Take 50,000 Units by mouth  once a week. ) 90 capsule 0  . clobetasol (TEMOVATE) 0.05 % external solution Apply 1 application topically 2 (two) times daily as needed (Apply to scalp.).     Marland Kitchen diclofenac sodium (VOLTAREN) 1 % GEL Apply 2 g topically 4 (four) times daily as needed (pain).     . Ferrous Sulfate (IRON) 28 MG TABS Take 1 tablet by mouth daily.    . fluticasone (FLONASE) 50 MCG/ACT nasal spray Place 2 sprays into both nostrils daily as needed for allergies.     . furosemide (LASIX) 40 MG tablet Take 1 tablet (40 mg total) by mouth 2 (two) times daily. 180 tablet 0  . gabapentin (NEURONTIN) 800 MG tablet Take 800 mg by mouth 4 (four) times daily.    Marland Kitchen ketoconazole (NIZORAL) 2 % cream Apply 1 application topically 2 (two) times daily.     Marland Kitchen levothyroxine (SYNTHROID, LEVOTHROID) 200 MCG tablet Take 1 tablet (200 mcg total) by mouth daily before breakfast. 90 tablet 2  . Multiple Vitamins-Minerals (CENTRAVITES 50 PLUS PO) Take by mouth daily.    . pantoprazole (PROTONIX) 20 MG tablet Take 1 tablet (20 mg total) by mouth daily. 90 tablet 1  . potassium chloride SA (K-DUR) 20 MEQ tablet     . rosuvastatin (CRESTOR) 20 MG tablet Take 20 mg by mouth daily.    . sacubitril-valsartan (ENTRESTO) 24-26 MG Take 1 tablet by mouth 2 (two) times daily. 60 tablet  11  . Simethicone (GAS-X PO) Take by mouth as needed.     No current facility-administered medications for this visit.      Past Medical History:  Diagnosis Date  . Anxiety   . Arthritis   . Asthma   . Automatic implantable cardioverter-defibrillator in situ    2013, july  . Bell palsy    states has had 3 episodes  . Cardiomyopathy 08/2010   Presented with congestive heart failure; EF of 15% and 2012; hypotension on medication precludes optimal dosing  . CHF (congestive heart failure) ([place])    a. EF 20% in 2012 with low-risk NST --> St. Jude ICD implantation by Dr. Lovena Le in 09/2011  . Chronic systolic heart failure ([place])   . COPD (chronic obstructive  pulmonary disease) ([place])    2013  . Dysrhythmia   . Gastroesophageal reflux disease   . Headache(784.0)   . Hot flashes 10/04/2016  . Hyperlipidemia   . Hypertension    09/2010-normal CMet and CBC; Lipid profile-116, 88, 25, 73  . Hypothyroidism    Recent TSH was normal.  . 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 Neuromuscular disorder (HCC)    Neuropathy, right foot and leg  . Neuropathy   . Obesity   . Pneumonia   . Seizures ([place])    last one at age 49  . Shortness of breath   . Stroke [facility])     stroke in 07/2012 and another in June, 2014  . Tobacco abuse    20 pack years    ROS:   All systems reviewed and negative except as noted in the HPI.   Past Surgical History:  Procedure Laterality Date  . CESAREAN SECTION     X2  . COLONOSCOPY N/A 12/16/2013   Procedure: COLONOSCOPY;  Surgeon: Danie Binder, MD;  [facility];  Service: Endoscopy;  Laterality: N/A;  10:45-moved to 10/5 @ [place] notified pt  . EP IMPLANTABLE DEVICE     St. Jude  . EP IMPLANTABLE DEVICE N/A 08/13/2015   Procedure: Loop Recorder Insertion;  Surgeon: Evans Lance, MD;  Location: [place] CV LAB;  Service: Cardiovascular;  Laterality: N/A;  . IMPLANTABLE CARDIOVERTER DEFIBRILLATOR IMPLANT N/A 10/10/2011   Procedure: IMPLANTABLE CARDIOVERTER DEFIBRILLATOR IMPLANT;  Surgeon: Evans Lance, MD;  [facility] CATH LAB;  Service: Cardiovascular;  Laterality: N/A;  . MULTIPLE EXTRACTIONS WITH ALVEOLOPLASTY N/A 09/24/2012   Procedure: MULTIPLE EXTRACION #2, 4, 6, 7 ,8, 9, 11, 13, 18, 20, 21, 22, 23, 24, 25, 26, 27, 29 WITH ALVEOLOPLASTY, BIOPSY OF PALATE LESION, REMOVA RIGHT LINGUAL TORUS;  Surgeon: Gae Bon, DDS;  Location: [place];  Service: Oral Surgery;  Laterality: N/A;  . TEE WITHOUT CARDIOVERSION N/A 07/11/2012   Procedure: TRANSESOPHAGEAL ECHOCARDIOGRAM (TEE);  Surgeon: Thayer Headings, MD;  Location[facility] ENDOSCOPY;  Service: Cardiovascular;  Laterality:  N/A;  . TUBAL LIGATION       Family History  Problem Relation Age of Onset  . Cardiomyopathy Mother        ICD pacemaker-ischmic CM  . Heart failure Mother   . Hypertension Mother   . Diabetes Mother   . Thyroid disease Mother   . Cardiomyopathy Father        Deceased  . Coronary artery disease Father   . Heart failure Father   . Heart failure Brother   . Hypertension Brother   . Endometriosis Sister   . Heart disease Paternal Grandfather   .  Heart disease Paternal Grandmother   . Heart disease Maternal Grandmother   . Heart disease Maternal Grandfather   . Hypertension Daughter   . Obesity Daughter   . Colon cancer Neg Hx   . Liver disease Neg Hx      Social History   Socioeconomic History  . Marital status: Divorced    Spouse name: Not on file  . Number of children: 2  . Years of education: Not on file  . Highest education level: Not on file  Occupational History    Employer: [employer]    Comment: Works in Environmental consultant  Social Needs  . Financial resource strain: Not on file  . Food insecurity    Worry: Not on file    Inability: Not on file  . Transportation needs    Medical: Not on file    Non-medical: Not on file  Tobacco Use  . Smoking status: Current Every Day Smoker    Packs/day: 0.50    Years: 21.00    Pack years: 10.50    Types: Cigarettes    Start date: 07/02/1983  . Smokeless tobacco: Never Used  . Tobacco comment: 5 cigs daily 11-26-12  Substance and Sexual Activity  . Alcohol use: No    Alcohol/week: 0.0 standard drinks  . Drug use: No  . Sexual activity: Not Currently    Birth control/protection: Abstinence, Surgical    Comment: tubal  Lifestyle  . Physical activity    Days per week: Not on file    Minutes per session: Not on file  . Stress: Not on file  Relationships  . Social Herbalist on phone: Not on file    Gets together: Not on file    Attends religious service: Not on file    Active member of club or  organization: Not on file    Attends meetings of clubs or organizations: Not on file    Relationship status: Not on file  . Intimate partner violence    Fear of current or ex partner: Not on file    Emotionally abused: Not on file    Physically abused: Not on file    Forced sexual activity: Not on file  Other Topics Concern  . Not on file  Social History Narrative  . Not on file     BP 128/76   Pulse 80   Temp (!) 97.1 F (36.2 C)   Ht 5' 7.5" (1.715 m)   Wt 290 lb (131.5 kg)   SpO2 97%   BMI 44.75 kg/m   Physical Exam:  Well appearing NAD HEENT: Unremarkable Neck:  No JVD, no thyromegally Lymphatics:  No adenopathy Back:  No CVA tenderness Lungs:  Clear with no wheezes HEART:  Regular rate rhythm, no murmurs, no rubs, no clicks Abd:  soft, positive bowel sounds, no organomegally, no rebound, no guarding Ext:  2 plus pulses, no edema, no cyanosis, no clubbing Skin:  No rashes no nodules Neuro:  CN II through XII intact, motor grossly intact  DEVICE  Normal device function.  See PaceArt for details.   Assess/Plan: 1. Chronic systolic heart failure - her symptoms are class 2. She is encouraged to maintain a low sodium diet and to lose weight. No change in meds today. 2. ICD - her St. Jude single chamber ICD is working normally. We will recheck in several months.  3. Obesity - she is encouraged to lose weight.   Mikle [email or web address].D.

## 2018-11-06 NOTE — Patient Instructions (Signed)

## 2018-11-12 ENCOUNTER — Encounter: Payer: Medicare Other | Admitting: *Deleted

## 2018-11-20 ENCOUNTER — Ambulatory Visit (INDEPENDENT_AMBULATORY_CARE_PROVIDER_SITE_OTHER): Payer: Medicare Other | Admitting: *Deleted

## 2018-11-20 DIAGNOSIS — I639 Cerebral infarction, unspecified: Secondary | ICD-10-CM

## 2018-11-20 LAB — CUP PACEART REMOTE DEVICE CHECK
Date Time Interrogation Session: 20200908153951
Implantable Lead Implant Date: 20130729
Implantable Lead Location: 753860
Implantable Lead Model: 181
Implantable Lead Serial Number: 322336
Implantable Pulse Generator Implant Date: 20170601

## 2018-11-21 ENCOUNTER — Encounter: Payer: Self-pay | Admitting: Cardiology

## 2018-12-06 ENCOUNTER — Encounter: Payer: Self-pay | Admitting: Gastroenterology

## 2018-12-06 NOTE — Progress Notes (Signed)
Carelink Summary Report / Loop Recorder 

## 2018-12-24 ENCOUNTER — Ambulatory Visit (INDEPENDENT_AMBULATORY_CARE_PROVIDER_SITE_OTHER): Payer: Medicare Other | Admitting: *Deleted

## 2018-12-24 DIAGNOSIS — I639 Cerebral infarction, unspecified: Secondary | ICD-10-CM | POA: Diagnosis not present

## 2018-12-24 LAB — CUP PACEART REMOTE DEVICE CHECK
Date Time Interrogation Session: 20201012162939
Implantable Lead Implant Date: 20130729
Implantable Lead Location: 753860
Implantable Lead Model: 181
Implantable Lead Serial Number: 322336
Implantable Pulse Generator Implant Date: 20170601

## 2018-12-25 ENCOUNTER — Ambulatory Visit (INDEPENDENT_AMBULATORY_CARE_PROVIDER_SITE_OTHER): Payer: Medicare Other | Admitting: *Deleted

## 2018-12-25 DIAGNOSIS — I639 Cerebral infarction, unspecified: Secondary | ICD-10-CM

## 2018-12-25 LAB — CUP PACEART REMOTE DEVICE CHECK
Battery Remaining Longevity: 34 mo
Battery Remaining Percentage: 31 %
Battery Voltage: 2.83 V
Brady Statistic RV Percent Paced: 1 %
Date Time Interrogation Session: 20201013093032
HighPow Impedance: 80 Ohm
HighPow Impedance: 80 Ohm
Implantable Lead Implant Date: 20130729
Implantable Lead Location: 753860
Implantable Lead Model: 181
Implantable Lead Serial Number: 322336
Implantable Pulse Generator Implant Date: 20130729
Lead Channel Impedance Value: 600 Ohm
Lead Channel Pacing Threshold Amplitude: 0.75 V
Lead Channel Pacing Threshold Pulse Width: 0.5 ms
Lead Channel Sensing Intrinsic Amplitude: 12 mV
Lead Channel Setting Pacing Amplitude: 2.5 V
Lead Channel Setting Pacing Pulse Width: 0.5 ms
Lead Channel Setting Sensing Sensitivity: 0.5 mV
Pulse Gen Serial Number: 1022442

## 2018-12-31 ENCOUNTER — Other Ambulatory Visit: Payer: Self-pay | Admitting: Student

## 2019-01-03 NOTE — Progress Notes (Signed)
Carelink Summary Report / Loop Recorder 

## 2019-01-07 ENCOUNTER — Ambulatory Visit (INDEPENDENT_AMBULATORY_CARE_PROVIDER_SITE_OTHER): Payer: Medicare Other | Admitting: *Deleted

## 2019-01-07 ENCOUNTER — Other Ambulatory Visit: Payer: Self-pay

## 2019-01-07 DIAGNOSIS — Z86010 Personal history of colonic polyps: Secondary | ICD-10-CM

## 2019-01-07 MED ORDER — PEG 3350-KCL-NA BICARB-NACL 420 G PO SOLR: 4000.0000 mL | Freq: Once | ORAL | 0 refills | Status: AC

## 2019-01-07 NOTE — Progress Notes (Signed)
Gastroenterology Pre-Procedure Review  Request Date: 01/07/2019 Requesting Physician: 5 year recall, Last TCS done 12/16/2013 by Dr. Oneida Alar, tubular adenoma, 3 hyperplastic polyps  PATIENT REVIEW QUESTIONS: The patient responded to the following health history questions as indicated:    1. Diabetes Melitis: no 2. Joint replacements in the past 12 months: no 3. Major health problems in the past 3 months: no 4. Has an artificial valve or MVP: no 5. Has a defibrillator: yes, 2013 6. Has been advised in past to take antibiotics in advance of a procedure like teeth cleaning: no 7. Family history of colon cancer: no 8. Alcohol Use: no 9. Illicit drug Use: no 10. History of sleep apnea: no 11. History of coronary artery or other vascular stents placed within the last 12 months: no 12. History of any prior anesthesia complications: no 13. There is no height or weight on file to calculate BMI. ht: 5'7 1/2    MEDICATIONS & ALLERGIES:    Patient reports the following regarding taking any blood thinners:   Plavix? no Aspirin? Yes, 81 mg x 2 times daily Coumadin? no Brilinta? no Xarelto? no Eliquis? no Pradaxa? no Savaysa? no Effient? no  Patient confirms/reports the following medications:  Current Outpatient Medications  Medication Sig Dispense Refill  . albuterol (PROVENTIL HFA;VENTOLIN HFA) 108 (90 BASE) MCG/ACT inhaler Inhale 2 puffs into the lungs every 6 (six) hours as needed for wheezing or shortness of breath. 1 Inhaler 5  . aspirin EC 81 MG tablet Take 81 mg by mouth 2 (two) times daily.     . Biotin 5000 MCG TABS Take by mouth daily.     . budesonide-formoterol (SYMBICORT) 80-4.5 MCG/ACT inhaler Inhale 2 puffs into the lungs 2 (two) times daily.    . carvedilol (COREG) 12.5 MG tablet Take 25 mg by mouth 2 (two) times daily with a meal.    . cetirizine (ZYRTEC) 10 MG tablet Take 10 mg by mouth daily.    . Cholecalciferol (VITAMIN D3) 5000 units CAPS TAKE ONE CAPSULE BY MOUTH  DAILY (Patient taking differently: Take 50,000 Units by mouth every 30 (thirty) days. ) 90 capsule 0  . clobetasol (TEMOVATE) 0.05 % external solution Apply 1 application topically 2 (two) times daily as needed (Apply to scalp.).     Marland Kitchen diclofenac sodium (VOLTAREN) 1 % GEL Apply 2 g topically 4 (four) times daily as needed (pain).     Marland Kitchen ENTRESTO 24-26 MG TAKE ONE TABLET BY MOUTH 2 TIMES A DAY 60 tablet 0  . Ferrous Sulfate (IRON) 28 MG TABS Take 1 tablet by mouth daily.    . fluticasone (FLONASE) 50 MCG/ACT nasal spray Place 2 sprays into both nostrils daily as needed for allergies.     . furosemide (LASIX) 40 MG tablet Take 1 tablet (40 mg total) by mouth 2 (two) times daily. 180 tablet 0  . gabapentin (NEURONTIN) 800 MG tablet Take 800 mg by mouth 4 (four) times daily.    Marland Kitchen ketoconazole (NIZORAL) 2 % cream Apply 1 application topically 2 (two) times daily.     Marland Kitchen levothyroxine (SYNTHROID, LEVOTHROID) 200 MCG tablet Take 1 tablet (200 mcg total) by mouth daily before breakfast. 90 tablet 2  . Multiple Vitamins-Minerals (CENTRAVITES 50 PLUS PO) Take by mouth daily.    . pantoprazole (PROTONIX) 20 MG tablet Take 1 tablet (20 mg total) by mouth daily. 90 tablet 1  . potassium chloride SA (K-DUR) 20 MEQ tablet daily.     . psyllium (METAMUCIL) 58.6 %  packet Take 1 packet by mouth daily.    . rosuvastatin (CRESTOR) 20 MG tablet Take 20 mg by mouth daily.    . Simethicone (GAS-X PO) Take by mouth as needed.     No current facility-administered medications for this visit.     Patient confirms/reports the following allergies:  Allergies  Allergen Reactions  . Fish Allergy Anaphylaxis  . Iodinated Diagnostic Agents Hives and Other (See Comments)    Pulmonary problems; no frank respiratory arrest  . Iodine Hives and Other (See Comments)    Pulmonary Problems   . Lentil Anaphylaxis  . Penicillins Anaphylaxis and Shortness Of Breath    Has patient had a PCN reaction causing immediate rash,  facial/tongue/throat swelling, SOB or lightheadedness with hypotension: Yes Has patient had a PCN reaction causing severe rash involving mucus membranes or skin necrosis: No Has patient had a PCN reaction that required hospitalization No Has patient had a PCN reaction occurring within the last 10 years: No If all of the above answers are "NO", then may proceed with Cephalosporin use.  Hair loss  . Lipitor [Atorvastatin] Itching and Rash  . Spironolactone Rash  . Sulfa Antibiotics Other (See Comments)    Unknown    No orders of the defined types were placed in this encounter.   AUTHORIZATION INFORMATION Primary Insurance: [employer] Medicare,  ID [id number] ,  Group #: [id number] Pre-Cert / Auth required: No, not required  Secondary Insurance: Medicaid,  ID #: [id number] Pre-Cert / Josem Kaufmann required: No, not required  SCHEDULE INFORMATION: Procedure has been scheduled as follows:  Date: 04/26/2019 , Time: 10:30 Location: [facility] with Dr. Oneida Alar  This Gastroenterology Pre-Precedure Review Form is being routed to the following provider(s): Roseanne Kaufman, NP

## 2019-01-07 NOTE — Patient Instructions (Signed)
Melanie Cordova   1960-10-26 MRN: [id number]    Procedure Date: 04/26/2019 Time to register: 9:30 am Place to register: [facility] Procedure Time: 10:30 am Scheduled provider: Dr. Oneida Alar  PREPARATION FOR COLONOSCOPY WITH TRI-LYTE SPLIT PREP  Please notify us immediately if you are diabetic, take iron supplements, or if you are on Coumadin or any other blood thinners.   You will need to purchase 1 fleet enema and 1 box of Bisacodyl '5mg'$  tablets.   1 DAY BEFORE PROCEDURE:  DATE: 04/25/2019   DAY: Thursday Continue clear liquids the entire day - NO SOLID FOOD.    At 2:00 pm:  Take 2 Bisacodyl tablets.   At 4:00pm:  Start drinking your solution. Make sure you mix well per instructions on the bottle. Try to drink 1 (one) 8 ounce glass every 10-15 minutes until you have consumed HALF the jug. You should complete by 6:00pm.You must keep the left over solution refrigerated until completed next day.  Continue clear liquids. You must drink plenty of clear liquids to prevent dehyration and kidney failure.     DAY OF PROCEDURE:   DATE: 04/26/2019   DAY: Friday If you take medications for your heart, blood pressure or breathing, you may take these medications.   Five hours before your procedure time @  5:30 am:  Finish remaining amout of bowel prep, drinking 1 (one) 8 ounce glass every 10-15 minutes until complete. You have two hours to consume remaining prep.   Three hours before your procedure time @ 7:30 am:  Nothing by mouth.   At least one hour before going to the hospital:  Give yourself one Fleet enema. You may take your morning medications with sip of water unless we have instructed otherwise.      Please see below for Dietary Information.  CLEAR LIQUIDS INCLUDE:  Water Jello (NOT red in color)   Ice Popsicles (NOT red in color)   Tea (sugar ok, no milk/cream) Powdered fruit flavored drinks  Coffee (sugar ok, no milk/cream) Gatorade/ Lemonade/ Kool-Aid  (NOT red in  color)   Juice: apple, white grape, white cranberry Soft drinks  Clear bullion, consomme, broth (fat free beef/chicken/vegetable)  Carbonated beverages (any kind)  Strained chicken noodle soup Hard Candy   Remember: Clear liquids are liquids that will allow you to see your fingers on the other side of a clear glass. Be sure liquids are NOT red in color, and not cloudy, but CLEAR.  DO NOT EAT OR DRINK ANY OF THE FOLLOWING:  Dairy products of any kind   Cranberry juice Tomato juice / V8 juice   Grapefruit juice Orange juice     Red grape juice  Do not eat any solid foods, including such foods as: cereal, oatmeal, yogurt, fruits, vegetables, creamed soups, eggs, bread, crackers, pureed foods in a blender, etc.   HELPFUL HINTS FOR DRINKING PREP SOLUTION:   Make sure prep is extremely cold. Mix and refrigerate the the morning of the prep. You may also put in the freezer.   You may try mixing some Crystal Light or Country Time Lemonade if you prefer. Mix in small amounts; add more if necessary.  Try drinking through a straw  Rinse mouth with water or a mouthwash between glasses, to remove after-taste.  Try sipping on a cold beverage /ice/ popsicles between glasses of prep.  Place a piece of sugar-free hard candy in mouth between glasses.  If you become nauseated, try consuming smaller amounts, or stretch  out the time between glasses. Stop for 30-60 minutes, then slowly start back drinking.        OTHER INSTRUCTIONS  You will need a responsible adult at least 57 years of age to accompany you and drive you home. This person must remain in the waiting room during your procedure. The hospital will cancel your procedure if you do not have a responsible adult with you.   1. Wear loose fitting clothing that is easily removed. 2. Leave jewelry and other valuables at home.  3. Remove all body piercing jewelry and leave at home. 4. Total time from sign-in until discharge is approximately  2-3 hours. 5. You should go home directly after your procedure and rest. You can resume normal activities the day after your procedure. 6. The day of your procedure you should not:  Drive  Make legal decisions  Operate machinery  Drink alcohol  Return to work   You may call the office (Dept: [phone]) before 5:00pm, or page the doctor on call [phone]) after 5:00pm, for further instructions, if necessary.   Insurance Information YOU WILL NEED TO CHECK WITH YOUR INSURANCE COMPANY FOR THE BENEFITS OF COVERAGE YOU HAVE FOR THIS PROCEDURE.  UNFORTUNATELY, NOT ALL INSURANCE COMPANIES HAVE BENEFITS TO COVER ALL OR PART OF THESE TYPES OF PROCEDURES.  IT IS YOUR RESPONSIBILITY TO CHECK YOUR BENEFITS, HOWEVER, WE WILL BE GLAD TO ASSIST YOU WITH ANY CODES YOUR INSURANCE COMPANY MAY NEED.    PLEASE NOTE THAT MOST INSURANCE COMPANIES WILL NOT COVER A SCREENING COLONOSCOPY FOR PEOPLE UNDER THE AGE OF 50  IF YOU HAVE BCBS INSURANCE, YOU MAY HAVE BENEFITS FOR A SCREENING COLONOSCOPY BUT IF POLYPS ARE FOUND THE DIAGNOSIS WILL CHANGE AND THEN YOU MAY HAVE A DEDUCTIBLE THAT WILL NEED TO BE MET. SO PLEASE MAKE SURE YOU CHECK YOUR BENEFITS FOR A SCREENING COLONOSCOPY AS WELL AS A DIAGNOSTIC COLONOSCOPY.

## 2019-01-07 NOTE — Progress Notes (Signed)
Remote ICD transmission.   

## 2019-01-10 ENCOUNTER — Encounter: Payer: Self-pay | Admitting: *Deleted

## 2019-01-10 NOTE — Progress Notes (Signed)
Best to do office visit. Multiple medications, BMI near cut-off for Propofol.

## 2019-01-10 NOTE — Progress Notes (Signed)
Mailed letter to pt with appointment information and procedure cancellation.   

## 2019-01-26 LAB — CUP PACEART REMOTE DEVICE CHECK
Date Time Interrogation Session: 20201114190102
Implantable Lead Implant Date: 20130729
Implantable Lead Location: 753860
Implantable Lead Model: 181
Implantable Lead Serial Number: 322336
Implantable Pulse Generator Implant Date: 20170601

## 2019-01-28 ENCOUNTER — Ambulatory Visit (INDEPENDENT_AMBULATORY_CARE_PROVIDER_SITE_OTHER): Payer: Medicare Other | Admitting: *Deleted

## 2019-01-28 DIAGNOSIS — I5022 Chronic systolic (congestive) heart failure: Secondary | ICD-10-CM | POA: Diagnosis not present

## 2019-01-28 DIAGNOSIS — I639 Cerebral infarction, unspecified: Secondary | ICD-10-CM

## 2019-02-14 ENCOUNTER — Ambulatory Visit: Payer: Medicare Other | Admitting: Gastroenterology

## 2019-02-19 NOTE — Progress Notes (Addendum)
REVIEWED-NO ADDITIONAL RECOMMENDATIONS.  Primary Care Physician:  Rosita Fire, MD Primary Gastroenterologist:  Dr. Oneida Alar   Chief Complaint  Patient presents with  . Consult    TCS last done 2015. HX polyps    HPI:   Melanie Cordova is a 58 y.o. female presenting today for an office visit prior to surveillance colonoscopy. Will need Propofol due to polypharmacy. Last colonoscopy in 2015 with one simple adenoma and 3 hyperplastic polyps. Small internal hemorrhoids. Left colon redundant.   Constipation: will go several days without a BM. Metamucil 2 tbsps twice a day. +flatus. No rectal bleeding. Rare reflux. No dysphagia. No weight loss or lack of appetite. Protonix once daily. Trying to eat as much fiber as she can.   Past Medical History:  Diagnosis Date  . Anxiety   . Arthritis   . Asthma   . Automatic implantable cardioverter-defibrillator in situ    2013, july  . Bell palsy    states has had 3 episodes  . Cardiomyopathy 08/2010   Presented with congestive heart failure; EF of 15% and 2012; hypotension on medication precludes optimal dosing  . CHF (congestive heart failure) ([place])    a. EF 20% in 2012 with low-risk NST --> St. Jude ICD implantation by Dr. Lovena Le in 09/2011  . Chronic systolic heart failure ([place])   . COPD (chronic obstructive pulmonary disease) ([place])    2013  . Dysrhythmia   . Gastroesophageal reflux disease   . Headache(784.0)   . Hot flashes 10/04/2016  . Hyperlipidemia   . Hypertension    09/2010-normal CMet and CBC; Lipid profile-116, 88, 25, 73  . Hypothyroidism    Recent TSH was normal.  . 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 Neuromuscular disorder (H[facility])    Neuropathy, right foot and leg  . Neuropathy   . Obesity   . Pneumonia   . Seizures ([place])    last one at age 89  . Shortness of breath   . Stroke [facility])     stroke in 07/2012 and another in June, 2014  . Tobacco abuse    20 pack years    Past Surgical  History:  Procedure Laterality Date  . CESAREAN SECTION     X2  . COLONOSCOPY N/A 12/16/2013   One simple adenoma and 3 hyperplastic polyps. Small internal hemorrhoids. Left colon redundant. Due for surveillance 2020.   Marland Kitchen EP IMPLANTABLE DEVICE     St. Jude  . EP IMPLANTABLE DEVICE N/A 08/13/2015   Procedure: Loop Recorder Insertion;  Surgeon: Evans Lance, MD;  Location: [place] [facility];  Service: Cardiovascular;  Laterality: N/A;  . IMPLANTABLE CARDIOVERTER DEFIBRILLATOR IMPLANT N/A 10/10/2011   Procedure: IMPLANTABLE CARDIOVERTER DEFIBRILLATOR IMPLANT;  Surgeon: Evans Lance, MD;  Location: [facility] CATH LAB;  Service: Cardiovascular;  Laterality: N/A;  . MULTIPLE EXTRACTIONS WITH ALVEOLOPLASTY N/A 09/24/2012   Procedure: MULTIPLE EXTRACION #2, 4, 6, 7 ,8, 9, 11, 13, 18, 20, 21, 22, 23, 24, 25, 26, 27, 29 WITH ALVEOLOPLASTY, BIOPSY OF PALATE LESION, REMOVA RIGHT LINGUAL TORUS;  Surgeon: Gae Bon, DDS;  Location: [facility];  Service: Oral Surgery;  Laterality: N/A;  . TEE WITHOUT CARDIOVERSION N/A 07/11/2012   Procedure: TRANSESOPHAGEAL ECHOCARDIOGRAM (TEE);  Surgeon: Thayer Headings, MD;  Location: [facility];  Service: Cardiovascular;  Laterality: N/A;  . TUBAL LIGATION      Current Outpatient Medications  Medication Sig Dispense Refill  . albuterol (PROVENTIL HFA;VENTOLIN HFA) 108 (  90 BASE) MCG/ACT inhaler Inhale 2 puffs into the lungs every 6 (six) hours as needed for wheezing or shortness of breath. 1 Inhaler 5  . aspirin EC 81 MG tablet Take 81 mg by mouth 2 (two) times daily.     Marland Kitchen BIOTIN PO Take 500 mcg by mouth daily.     . budesonide-formoterol (SYMBICORT) 80-4.5 MCG/ACT inhaler Inhale 2 puffs into the lungs 2 (two) times daily.    . carvedilol (COREG) 12.5 MG tablet Take 25 mg by mouth 2 (two) times daily with a meal.    . cetirizine (ZYRTEC) 10 MG tablet Take 10 mg by mouth daily.    . Cholecalciferol (VITAMIN D3) 5000 units CAPS TAKE ONE CAPSULE BY MOUTH DAILY (Patient taking  differently: Take 50,000 Units by mouth every 30 (thirty) days. ) 90 capsule 0  . clobetasol (TEMOVATE) 0.05 % external solution Apply 1 application topically 2 (two) times daily as needed (Apply to scalp.).     Marland Kitchen diclofenac sodium (VOLTAREN) 1 % GEL Apply 2 g topically 4 (four) times daily as needed (pain).     Marland Kitchen diphenhydrAMINE (BENADRYL) 25 MG tablet Take 25 mg by mouth every 6 (six) hours as needed.    Marland Kitchen ENTRESTO 24-26 MG TAKE ONE TABLET BY MOUTH 2 TIMES A DAY 60 tablet 0  . Ferrous Sulfate (IRON) 28 MG TABS Take 1 tablet by mouth daily.    . fluticasone (FLONASE) 50 MCG/ACT nasal spray Place 2 sprays into both nostrils daily as needed for allergies.     . furosemide (LASIX) 40 MG tablet Take 1 tablet (40 mg total) by mouth 2 (two) times daily. 180 tablet 0  . gabapentin (NEURONTIN) 800 MG tablet Take 800 mg by mouth every 6 (six) hours.     Marland Kitchen ketoconazole (NIZORAL) 2 % cream Apply 1 application topically 2 (two) times daily.     Marland Kitchen levothyroxine (SYNTHROID, LEVOTHROID) 200 MCG tablet Take 1 tablet (200 mcg total) by mouth daily before breakfast. 90 tablet 2  . Multiple Vitamins-Minerals (CENTRAVITES 50 PLUS PO) Take by mouth daily.    . pantoprazole (PROTONIX) 20 MG tablet Take 1 tablet (20 mg total) by mouth daily. 90 tablet 1  . potassium chloride SA (K-DUR) 20 MEQ tablet Take 20 mEq by mouth 2 (two) times daily.     . psyllium (METAMUCIL) 58.6 % packet Take 1 packet by mouth daily.    . rosuvastatin (CRESTOR) 20 MG tablet Take 20 mg by mouth daily.    . Simethicone (GAS-X PO) Take by mouth as needed.     No current facility-administered medications for this visit.     Allergies as of 02/20/2019 - Review Complete 02/20/2019  Allergen Reaction Noted  . Fish allergy Anaphylaxis 03/20/2012  . Iodinated diagnostic agents Hives and Other (See Comments) 09/24/2010  . Iodine Hives and Other (See Comments) 12/23/2010  . Lentil Anaphylaxis 03/20/2012  . Penicillins Anaphylaxis and Shortness  Of Breath 05/04/2010  . Lipitor [atorvastatin] Itching and Rash 07/27/2012  . Spironolactone Rash 07/11/2011  . Sulfa antibiotics Other (See Comments) 05/04/2010    Family History  Problem Relation Age of Onset  . Cardiomyopathy Mother        ICD pacemaker-ischmic CM  . Heart failure Mother   . Hypertension Mother   . Diabetes Mother   . Thyroid disease Mother   . Cardiomyopathy Father        Deceased  . Coronary artery disease Father   . Heart failure Father   .  Heart failure Brother   . Hypertension Brother   . Endometriosis Sister   . Heart disease Paternal Grandfather   . Heart disease Paternal Grandmother   . Heart disease Maternal Grandmother   . Heart disease Maternal Grandfather   . Hypertension Daughter   . Obesity Daughter   . Colon cancer Neg Hx   . Liver disease Neg Hx     Social History   Socioeconomic History  . Marital status: Divorced    Spouse name: Not on file  . Number of children: 2  . Years of education: Not on file  . Highest education level: Not on file  Occupational History    Employer: [employer]    Comment: Works in Environmental consultant  Social Needs  . Financial resource strain: Not on file  . Food insecurity    Worry: Not on file    Inability: Not on file  . Transportation needs    Medical: Not on file    Non-medical: Not on file  Tobacco Use  . Smoking status: Current Every Day Smoker    Packs/day: 0.50    Years: 21.00    Pack years: 10.50    Types: Cigarettes    Start date: 07/02/1983  . Smokeless tobacco: Never Used  Substance and Sexual Activity  . Alcohol use: No    Alcohol/week: 0.0 standard drinks  . Drug use: No  . Sexual activity: Not Currently    Birth control/protection: Abstinence, Surgical    Comment: tubal  Lifestyle  . Physical activity    Days per week: Not on file    Minutes per session: Not on file  . Stress: Not on file  Relationships  . Social Herbalist on phone: Not on file    Gets  together: Not on file    Attends religious service: Not on file    Active member of club or organization: Not on file    Attends meetings of clubs or organizations: Not on file    Relationship status: Not on file  . Intimate partner violence    Fear of current or ex partner: Not on file    Emotionally abused: Not on file    Physically abused: Not on file    Forced sexual activity: Not on file  Other Topics Concern  . Not on file  Social History Narrative  . Not on file    Review of Systems: Gen: Denies any fever, chills, fatigue, weight loss, lack of appetite.  CV: Denies chest pain, heart palpitations, peripheral edema, syncope.  Resp: +DOE GI: see HPI GU : Denies urinary burning, urinary frequency, urinary hesitancy MS: arthritis, uses cane Derm: Denies rash, itching, dry skin Psych: Denies depression, anxiety, memory loss, and confusion Heme: Denies bruising, bleeding, and enlarged lymph nodes.  Physical Exam: BP 120/77   Pulse 97   Temp 97.6 F (36.4 C) (Oral)   Ht 5' 7.5" (1.715 m)   Wt (!) 303 lb 6.4 oz (137.6 kg)   BMI 46.82 kg/m  General:   Alert and oriented. Pleasant and cooperative. Well-nourished and well-developed.  Head:  Normocephalic and atraumatic. Eyes:  Without icterus, sclera clear and conjunctiva pink.  Ears:  Normal auditory acuity. Lungs:  Clear to auscultation bilaterally. No wheezes, rales, or rhonchi. No distress.  Heart:  S1, S2 present without murmurs appreciated.  Abdomen:  +BS, soft, obese, non-tender and non-distended. No HSM noted. No guarding or rebound. No masses appreciated.  Rectal:  Deferred  Msk:  Symmetrical without gross deformities. Normal posture. Extremities:  Without edema. Neurologic:  Alert and  oriented x4 Skin:  Intact without significant lesions or rashes. Psych:  Alert and cooperative. Normal mood and affect.  ASSESSMENT: Melanie Cordova is a 58 y.o. female presenting today with a history of colon polyps and due  for surveillance now. Will need Propofol due to polypharmacy.   Constipation: despite supplemental fiber. Will start Linzess 145 mcg daily. Samples provided.    PLAN:  1. Proceed with colonoscopy with Dr. Oneida Alar in the near future. The risks, benefits, and alternatives have been discussed in detail with the patient. They state understanding and desire to proceed. Propofol due to polypharmacy. Hold iron X 7 days prior   2. Start Linzess 145 mcg once daily, 30 minutes before breakfast. Samples provided. Call for prescription.  3. Follow-up thereafter.   Annitta Needs, PhD, ANP-BC [facility]

## 2019-02-20 ENCOUNTER — Ambulatory Visit (INDEPENDENT_AMBULATORY_CARE_PROVIDER_SITE_OTHER): Payer: Medicare Other | Admitting: Gastroenterology

## 2019-02-20 ENCOUNTER — Encounter: Payer: Self-pay | Admitting: Gastroenterology

## 2019-02-20 ENCOUNTER — Other Ambulatory Visit: Payer: Self-pay

## 2019-02-20 DIAGNOSIS — Z860100 Personal history of colon polyps, unspecified: Secondary | ICD-10-CM | POA: Insufficient documentation

## 2019-02-20 DIAGNOSIS — K5900 Constipation, unspecified: Secondary | ICD-10-CM | POA: Diagnosis not present

## 2019-02-20 MED ORDER — CLENPIQ 10-3.5-12 MG-GM -GM/160ML PO SOLN: 1.0000 | Freq: Once | ORAL | 0 refills | Status: AC

## 2019-02-20 NOTE — Patient Instructions (Signed)
We are arranging a colonoscopy with Dr. Oneida Alar in the future.  For constipation: start taking Linzess 1 capsule each morning on an empty stomach (30 minutes before breakfast if possible). If taken with food, it can cause looser stool. Call us with how this works for you! You may have looser stool starting out, but it should get better after 4-5 days. If this does not, please call!  We will see you thereafter.  Have a wonderful Christmas!  It was a pleasure to see you today. I want to create trusting relationships with patients to provide genuine, compassionate, and quality care. I value your feedback. If you receive a survey regarding your visit,  I greatly appreciate you taking time to fill this out.   Annitta Needs, PhD, ANP-BC [facility]

## 2019-02-20 NOTE — Progress Notes (Signed)
cc'ed to pcp °

## 2019-02-22 ENCOUNTER — Encounter: Payer: Self-pay | Admitting: *Deleted

## 2019-02-23 NOTE — Progress Notes (Signed)
Carelink Summary Report / Loop Recorder 

## 2019-02-28 ENCOUNTER — Ambulatory Visit (INDEPENDENT_AMBULATORY_CARE_PROVIDER_SITE_OTHER): Payer: Medicare Other | Admitting: *Deleted

## 2019-02-28 DIAGNOSIS — I639 Cerebral infarction, unspecified: Secondary | ICD-10-CM | POA: Diagnosis not present

## 2019-02-28 LAB — CUP PACEART REMOTE DEVICE CHECK
Date Time Interrogation Session: 20201217140618
Implantable Lead Implant Date: 20130729
Implantable Lead Location: 753860
Implantable Lead Model: 181
Implantable Lead Serial Number: 322336
Implantable Pulse Generator Implant Date: 20170601

## 2019-03-30 NOTE — Progress Notes (Signed)
ILR remote 

## 2019-04-01 ENCOUNTER — Telehealth: Payer: Self-pay | Admitting: Emergency Medicine

## 2019-04-01 NOTE — Telephone Encounter (Signed)
Notified patient that her LINQ was at RRT. Remote monitor for LINQ unplugged and stored in closet. Confirmed Merlin remote monitor still connected for her ICD. She would like to wait until she sees Dr Lovena Le in August, 2021 to have LINQ removed.

## 2019-04-02 ENCOUNTER — Ambulatory Visit (INDEPENDENT_AMBULATORY_CARE_PROVIDER_SITE_OTHER): Payer: Medicare Other | Admitting: *Deleted

## 2019-04-02 DIAGNOSIS — I639 Cerebral infarction, unspecified: Secondary | ICD-10-CM | POA: Diagnosis not present

## 2019-04-02 LAB — CUP PACEART REMOTE DEVICE CHECK
Date Time Interrogation Session: 20210119141120
Implantable Lead Implant Date: 20130729
Implantable Lead Location: 753860
Implantable Lead Model: 181
Implantable Lead Serial Number: 322336
Implantable Pulse Generator Implant Date: 20170601

## 2019-04-03 ENCOUNTER — Ambulatory Visit (INDEPENDENT_AMBULATORY_CARE_PROVIDER_SITE_OTHER): Payer: Medicare Other | Admitting: *Deleted

## 2019-04-03 DIAGNOSIS — I639 Cerebral infarction, unspecified: Secondary | ICD-10-CM | POA: Diagnosis not present

## 2019-04-03 LAB — CUP PACEART REMOTE DEVICE CHECK
Battery Remaining Longevity: 26 mo
Battery Remaining Longevity: 26 mo
Battery Remaining Percentage: 24 %
Battery Remaining Percentage: 24 %
Battery Voltage: 2.8 V
Battery Voltage: 2.8 V
Brady Statistic RV Percent Paced: 1 %
Brady Statistic RV Percent Paced: 1 %
Date Time Interrogation Session: 20210120050706
Date Time Interrogation Session: 20210120050706
HighPow Impedance: 83 Ohm
HighPow Impedance: 83 Ohm
HighPow Impedance: 83 Ohm
HighPow Impedance: 83 Ohm
Implantable Lead Implant Date: 20130729
Implantable Lead Implant Date: 20130729
Implantable Lead Location: 753860
Implantable Lead Location: 753860
Implantable Lead Model: 181
Implantable Lead Model: 181
Implantable Lead Serial Number: 322336
Implantable Lead Serial Number: 322336
Implantable Pulse Generator Implant Date: 20130729
Implantable Pulse Generator Implant Date: 20130729
Lead Channel Impedance Value: 680 Ohm
Lead Channel Impedance Value: 680 Ohm
Lead Channel Pacing Threshold Amplitude: 0.75 V
Lead Channel Pacing Threshold Amplitude: 0.75 V
Lead Channel Pacing Threshold Pulse Width: 0.5 ms
Lead Channel Pacing Threshold Pulse Width: 0.5 ms
Lead Channel Sensing Intrinsic Amplitude: 12 mV
Lead Channel Sensing Intrinsic Amplitude: 12 mV
Lead Channel Setting Pacing Amplitude: 2.5 V
Lead Channel Setting Pacing Amplitude: 2.5 V
Lead Channel Setting Pacing Pulse Width: 0.5 ms
Lead Channel Setting Pacing Pulse Width: 0.5 ms
Lead Channel Setting Sensing Sensitivity: 0.5 mV
Lead Channel Setting Sensing Sensitivity: 0.5 mV
Pulse Gen Serial Number: 1022442
Pulse Gen Serial Number: 1022442

## 2019-04-04 LAB — CUP PACEART REMOTE DEVICE CHECK
Battery Remaining Longevity: 26 mo
Battery Remaining Percentage: 24 %
Battery Voltage: 2.8 V
Brady Statistic RV Percent Paced: 1 %
Date Time Interrogation Session: 20210120050706
HighPow Impedance: 83 Ohm
HighPow Impedance: 83 Ohm
Implantable Lead Implant Date: 20130729
Implantable Lead Location: 753860
Implantable Lead Model: 181
Implantable Lead Serial Number: 322336
Implantable Pulse Generator Implant Date: 20130729
Lead Channel Impedance Value: 680 Ohm
Lead Channel Pacing Threshold Amplitude: 0.75 V
Lead Channel Pacing Threshold Pulse Width: 0.5 ms
Lead Channel Sensing Intrinsic Amplitude: 12 mV
Lead Channel Setting Pacing Amplitude: 2.5 V
Lead Channel Setting Pacing Pulse Width: 0.5 ms
Lead Channel Setting Sensing Sensitivity: 0.5 mV
Pulse Gen Serial Number: 1022442

## 2019-04-24 ENCOUNTER — Other Ambulatory Visit (HOSPITAL_COMMUNITY): Payer: Medicare Other

## 2019-05-29 NOTE — Patient Instructions (Signed)
Your procedure is scheduled on: 06/04/2019  Report to [facility] at  9:00   AM.  Call this number if you have problems the morning of surgery: [phone]   Remember:              Follow Directions on the letter you received from Your Physician's office regarding the Bowel Prep              No Smoking the day of Procedure :   Take these medicines the morning of surgery with A SIP OF WATER: Carvedilol, Zyrtec, levothyoxine, pepcid, flonase, and              pantoprazole   Do not wear jewelry, make-up or nail polish.    Do not bring valuables to the hospital.  Contacts, dentures or bridgework may not be worn into surgery.  .   Patients discharged the day of surgery will not be allowed to drive home.     Colonoscopy, Adult, Care After This sheet gives you information about how to care for yourself after your procedure. Your health care provider may also give you more specific instructions. If you have problems or questions, contact your health care provider. What can I expect after the procedure? After the procedure, it is common to have:  A small amount of blood in your stool for 24 hours after the procedure.  Some gas.  Mild abdominal cramping or bloating.  Follow these instructions at home: General instructions   For the first 24 hours after the procedure: ? Do not drive or use machinery. ? Do not sign important documents. ? Do not drink alcohol. ? Do your regular daily activities at a slower pace than normal. ? Eat soft, easy-to-digest foods. ? Rest often.  Take over-the-counter or prescription medicines only as told by your health care provider.  It is up to you to get the results of your procedure. Ask your health care provider, or the department performing the procedure, when your results will be ready. Relieving cramping and bloating  Try walking around when you have cramps or feel bloated.  Apply heat to your abdomen as told by your health care provider.  Use a heat source that your health care provider recommends, such as a moist heat pack or a heating pad. ? Place a towel between your skin and the heat source. ? Leave the heat on for 20-30 minutes. ? Remove the heat if your skin turns bright red. This is especially important if you are unable to feel pain, heat, or cold. You may have a greater risk of getting burned. Eating and drinking  Drink enough fluid to keep your urine clear or pale yellow.  Resume your normal diet as instructed by your health care provider. Avoid heavy or fried foods that are hard to digest.  Avoid drinking alcohol for as long as instructed by your health care provider. Contact a health care provider if:  You have blood in your stool 2-3 days after the procedure. Get help right away if:  You have more than a small spotting of blood in your stool.  You pass large blood clots in your stool.  Your abdomen is swollen.  You have nausea or vomiting.  You have a fever.  You have increasing abdominal pain that is not relieved with medicine. This information is not intended to replace advice given to you by your health care provider. Make sure you discuss any questions you have with your health care  provider. Document Released: 10/13/2003 Document Revised: 11/23/2015 Document Reviewed: 05/12/2015 Elsevier Interactive Patient Education  [employer].

## 2019-06-03 ENCOUNTER — Other Ambulatory Visit (HOSPITAL_COMMUNITY)
Admission: RE | Admit: 2019-06-03 | Discharge: 2019-06-03 | Disposition: A | Discharge status: Home / Self Care | Payer: Medicare Other | Source: Ambulatory Visit | Attending: Gastroenterology | Admitting: Gastroenterology

## 2019-06-03 ENCOUNTER — Encounter (HOSPITAL_COMMUNITY): Payer: Self-pay

## 2019-06-03 ENCOUNTER — Encounter (HOSPITAL_COMMUNITY)
Admission: RE | Admit: 2019-06-03 | Discharge: 2019-06-03 | Disposition: A | Discharge status: Home / Self Care | Payer: Medicare Other | Source: Ambulatory Visit | Attending: Gastroenterology | Admitting: Gastroenterology

## 2019-06-03 ENCOUNTER — Other Ambulatory Visit: Payer: Self-pay

## 2019-06-03 DIAGNOSIS — Z20822 Contact with and (suspected) exposure to covid-19: Secondary | ICD-10-CM | POA: Diagnosis not present

## 2019-06-03 DIAGNOSIS — Z01812 Encounter for preprocedural laboratory examination: Secondary | ICD-10-CM | POA: Insufficient documentation

## 2019-06-03 LAB — BASIC METABOLIC PANEL
Anion gap: 10 (ref 5–15)
BUN: 12 mg/dL (ref 6–20)
CO2: 27 mmol/L (ref 22–32)
Calcium: 8.9 mg/dL (ref 8.9–10.3)
Chloride: 105 mmol/L (ref 98–111)
Creatinine, Ser: 0.66 mg/dL (ref 0.44–1.00)
GFR calc Af Amer: >60 mL/min (ref >60)
GFR calc non Af Amer: >60 mL/min (ref >60)
Glucose, Bld: 97 mg/dL (ref 70–99)
Potassium: 3.8 mmol/L (ref 3.5–5.1)
Sodium: 142 mmol/L (ref 135–145)

## 2019-06-03 LAB — SARS CORONAVIRUS 2 (TAT 6-24 HRS): SARS Coronavirus 2: NEGATIVE

## 2019-06-03 NOTE — Pre-Procedure Instructions (Signed)
Spoke with Dr Briant Cedar concerning defibrillator. Will use magnet if cautery is needed.

## 2019-06-04 ENCOUNTER — Ambulatory Visit (HOSPITAL_COMMUNITY): Payer: Medicare Other | Admitting: Anesthesiology

## 2019-06-04 ENCOUNTER — Ambulatory Visit (HOSPITAL_COMMUNITY)
Admission: RE | Admit: 2019-06-04 | Discharge: 2019-06-04 | Disposition: A | Discharge status: Home / Self Care | Payer: Medicare Other | Attending: Gastroenterology | Admitting: Gastroenterology

## 2019-06-04 ENCOUNTER — Encounter (HOSPITAL_COMMUNITY): Payer: Self-pay | Admitting: Gastroenterology

## 2019-06-04 ENCOUNTER — Other Ambulatory Visit: Payer: Self-pay

## 2019-06-04 ENCOUNTER — Encounter (HOSPITAL_COMMUNITY)
Admission: RE | Disposition: A | Discharge status: Home / Self Care | Payer: Self-pay | Source: Home / Self Care | Attending: Gastroenterology

## 2019-06-04 DIAGNOSIS — Z8249 Family history of ischemic heart disease and other diseases of the circulatory system: Secondary | ICD-10-CM | POA: Diagnosis not present

## 2019-06-04 DIAGNOSIS — Z86010 Personal history of colonic polyps: Secondary | ICD-10-CM | POA: Insufficient documentation

## 2019-06-04 DIAGNOSIS — E669 Obesity, unspecified: Secondary | ICD-10-CM | POA: Diagnosis not present

## 2019-06-04 DIAGNOSIS — I5022 Chronic systolic (congestive) heart failure: Secondary | ICD-10-CM | POA: Insufficient documentation

## 2019-06-04 DIAGNOSIS — Z6841 Body Mass Index (BMI) 40.0 and over, adult: Secondary | ICD-10-CM | POA: Diagnosis not present

## 2019-06-04 DIAGNOSIS — D124 Benign neoplasm of descending colon: Secondary | ICD-10-CM | POA: Insufficient documentation

## 2019-06-04 DIAGNOSIS — F17210 Nicotine dependence, cigarettes, uncomplicated: Secondary | ICD-10-CM | POA: Diagnosis not present

## 2019-06-04 DIAGNOSIS — Z7982 Long term (current) use of aspirin: Secondary | ICD-10-CM | POA: Diagnosis not present

## 2019-06-04 DIAGNOSIS — Q438 Other specified congenital malformations of intestine: Secondary | ICD-10-CM | POA: Diagnosis not present

## 2019-06-04 DIAGNOSIS — K621 Rectal polyp: Secondary | ICD-10-CM

## 2019-06-04 DIAGNOSIS — Z95810 Presence of automatic (implantable) cardiac defibrillator: Secondary | ICD-10-CM | POA: Insufficient documentation

## 2019-06-04 DIAGNOSIS — Z79899 Other long term (current) drug therapy: Secondary | ICD-10-CM | POA: Diagnosis not present

## 2019-06-04 DIAGNOSIS — J449 Chronic obstructive pulmonary disease, unspecified: Secondary | ICD-10-CM | POA: Insufficient documentation

## 2019-06-04 DIAGNOSIS — Z8673 Personal history of transient ischemic attack (TIA), and cerebral infarction without residual deficits: Secondary | ICD-10-CM | POA: Diagnosis not present

## 2019-06-04 DIAGNOSIS — K635 Polyp of colon: Secondary | ICD-10-CM | POA: Diagnosis not present

## 2019-06-04 DIAGNOSIS — M1990 Unspecified osteoarthritis, unspecified site: Secondary | ICD-10-CM | POA: Diagnosis not present

## 2019-06-04 DIAGNOSIS — K648 Other hemorrhoids: Secondary | ICD-10-CM | POA: Insufficient documentation

## 2019-06-04 DIAGNOSIS — D123 Benign neoplasm of transverse colon: Secondary | ICD-10-CM | POA: Insufficient documentation

## 2019-06-04 DIAGNOSIS — K644 Residual hemorrhoidal skin tags: Secondary | ICD-10-CM | POA: Insufficient documentation

## 2019-06-04 DIAGNOSIS — E039 Hypothyroidism, unspecified: Secondary | ICD-10-CM | POA: Diagnosis not present

## 2019-06-04 DIAGNOSIS — K219 Gastro-esophageal reflux disease without esophagitis: Secondary | ICD-10-CM | POA: Insufficient documentation

## 2019-06-04 DIAGNOSIS — D122 Benign neoplasm of ascending colon: Secondary | ICD-10-CM | POA: Diagnosis not present

## 2019-06-04 DIAGNOSIS — Z79890 Hormone replacement therapy (postmenopausal): Secondary | ICD-10-CM | POA: Diagnosis not present

## 2019-06-04 DIAGNOSIS — Z7951 Long term (current) use of inhaled steroids: Secondary | ICD-10-CM | POA: Insufficient documentation

## 2019-06-04 DIAGNOSIS — E785 Hyperlipidemia, unspecified: Secondary | ICD-10-CM | POA: Insufficient documentation

## 2019-06-04 DIAGNOSIS — I110 Hypertensive heart disease with heart failure: Secondary | ICD-10-CM | POA: Insufficient documentation

## 2019-06-04 HISTORY — PX: POLYPECTOMY: SHX5525

## 2019-06-04 HISTORY — PX: COLONOSCOPY WITH PROPOFOL: SHX5780

## 2019-06-04 SURGERY — COLONOSCOPY WITH PROPOFOL
Anesthesia: General

## 2019-06-04 MED ORDER — PROPOFOL 500 MG/50ML IV EMUL
INTRAVENOUS | Status: DC | PRN
  Administered 2019-06-04: 100 ug/kg/min via INTRAVENOUS

## 2019-06-04 MED ORDER — LIDOCAINE 2% (20 MG/ML) 5 ML SYRINGE
INTRAMUSCULAR | Status: AC
  Filled 2019-06-04: qty 5

## 2019-06-04 MED ORDER — CHLORHEXIDINE GLUCONATE CLOTH 2 % EX PADS: 6.0000 | MEDICATED_PAD | Freq: Once | CUTANEOUS | Status: DC

## 2019-06-04 MED ORDER — KETAMINE HCL 50 MG/5ML IJ SOSY
PREFILLED_SYRINGE | INTRAMUSCULAR | Status: AC
  Filled 2019-06-04: qty 5

## 2019-06-04 MED ORDER — STERILE WATER FOR IRRIGATION IR SOLN
Status: DC | PRN
  Administered 2019-06-04: 1.5 mL

## 2019-06-04 MED ORDER — PROPOFOL 10 MG/ML IV BOLUS
INTRAVENOUS | Status: AC
  Filled 2019-06-04: qty 80

## 2019-06-04 MED ORDER — PROPOFOL 10 MG/ML IV BOLUS
INTRAVENOUS | Status: DC | PRN
  Administered 2019-06-04: 70 mg via INTRAVENOUS
  Administered 2019-06-04: 30 mg via INTRAVENOUS

## 2019-06-04 MED ORDER — KETAMINE HCL 10 MG/ML IJ SOLN
INTRAMUSCULAR | Status: DC | PRN
  Administered 2019-06-04: 10 mg via INTRAVENOUS
  Administered 2019-06-04: 20 mg via INTRAVENOUS

## 2019-06-04 MED ORDER — LIDOCAINE HCL (CARDIAC) PF 100 MG/5ML IV SOSY
PREFILLED_SYRINGE | INTRAVENOUS | Status: DC | PRN
  Administered 2019-06-04: 50 mg via INTRATRACHEAL

## 2019-06-04 MED ORDER — LACTATED RINGERS IV SOLN: INTRAVENOUS | Status: DC

## 2019-06-04 NOTE — Op Note (Signed)
[facility] Patient Name: [place] Procedure Date: 06/04/2019 8:11 AM MRN: [id number] Date of Birth: Sep 23, 1960 Attending MD: Barney Drain MD, MD CSN: [id number] Age: 59 Admit Type: Outpatient Procedure:                Colonoscopy WITH COLD SNARE POLYPECTOMY Indications:              Personal history of colonic polyps Providers:                Barney Drain MD, MD, Charlsie Quest. Theda Sers RN, RN,                            Randa Spike, Technician Referring MD:             Rosita Fire MD, MD Medicines:                Propofol per Anesthesia Complications:            No immediate complications. Estimated Blood Loss:     Estimated blood loss was minimal. Procedure:                Pre-Anesthesia Assessment:                           - Prior to the procedure, a History and Physical                            was performed, and patient medications and                            allergies were reviewed. The patient's tolerance of                            previous anesthesia was also reviewed. The risks                            and benefits of the procedure and the sedation                            options and risks were discussed with the patient.                            All questions were answered, and informed consent                            was obtained. Prior Anticoagulants: The patient has                            taken no previous anticoagulant or antiplatelet                            agents except for aspirin. ASA Grade Assessment:                            III - A patient with severe systemic disease. After  reviewing the risks and benefits, the patient was                            deemed in satisfactory condition to undergo the                            procedure. After obtaining informed consent, the                            colonoscope was passed under direct vision.                            Throughout the procedure, the  patient's blood                            pressure, pulse, and oxygen saturations were                            monitored continuously. The [id number]-[id number]                            scope was introduced through the anus and advanced                            to the the cecum, identified by appendiceal orifice                            and ileocecal valve. The colonoscopy was somewhat                            difficult due to a tortuous colon. Successful                            completion of the procedure was aided by                            straightening and shortening the scope to obtain                            bowel loop reduction and COLOWRAP. The patient                            tolerated the procedure well. The quality of the                            bowel preparation was good. The ileocecal valve,                            appendiceal orifice, and rectum were photographed. Scope In: 9:41:06 AM Scope Out: 10:17:39 AM Scope Withdrawal Time: 0 hours 33 minutes 34 seconds  Total Procedure Duration: 0 hours 36 minutes 33 seconds  Findings:      Six sessile polyps were found in the descending colon, mid transverse  colon, distal transverse colon and ascending colon. The polyps were 4 to       8 mm in size. These polyps were removed with a cold snare. Resection and       retrieval were complete.      External and internal hemorrhoids were found.      The recto-sigmoid colon and sigmoid colon were mildly tortuous. Impression:               - Six 4 to 8 mm polyps in the descending colon, in                            the mid transverse colon, in the distal transverse                            colon(2) and in the ascending colon(2:BTL 1),                            removed with a cold snare. Resected and retrieved.                           - External and internal hemorrhoids.                           - Tortuous colon. Moderate Sedation:      Per  Anesthesia Care Recommendation:           - Patient has a contact number available for                            emergencies. The signs and symptoms of potential                            delayed complications were discussed with the                            patient. Return to normal activities tomorrow.                            Written discharge instructions were provided to the                            patient.                           - High fiber diet.                           - Continue present medications.                           - Await pathology results.                           - Repeat colonoscopy date to be determined after  pending pathology results are reviewed for                            surveillance. Procedure Code(s):        --- Professional ---                           [phone], Colonoscopy, flexible; with removal of                            tumor(s), polyp(s), or other lesion(s) by snare                            technique Diagnosis Code(s):        --- Professional ---                           K63.5, Polyp of colon                           K64.8, Other hemorrhoids                           Z86.010, Personal history of colonic polyps                           Q43.8, Other specified congenital malformations of                            intestine CPT copyright 2019 American [employer] Association. All rights reserved. The codes documented in this report are preliminary and upon coder review may  be revised to meet current compliance requirements. Barney Drain, MD Barney Drain MD, MD 06/04/2019 10:32:29 AM This report has been signed electronically. Number of Addenda: 0

## 2019-06-04 NOTE — Discharge Instructions (Signed)
You had SIX polyps removed. You have internal  And external hemorrhoids.   CONTINUE YOUR WEIGHT LOSS EFFORTS.  WHILE I DO NOT WANT TO ALARM YOU, YOUR BODY MASS INDEX IS OVER 40 WHICH MEANS YOU ARE MORBIDLY OBESE. THIS CAN SHORTEN YOUR LIFE EXPECTANCY 10 YEARS. AS WELL OBESITY CAN ACTIVATE CANCER GENES.  OBESITY IS ASSOCIATED WITH AN INCREASED RISK FOR CIRRHOSIS AND  ALL CANCERS, INCLUDING ESOPHAGEAL AND COLON CANCER. If youtry and can't lose weight over the next year, [place].  DRINK WATER TO KEEP YOUR URINE LIGHT YELLOW.  FOLLOW A HIGH FIBER DIET. AVOID ITEMS THAT CAUSE BLOATING & GAS. SEE INFO BELOW.   USE PREPARATION H FOUR TIMES  A DAY IF NEEDED TO RELIEVE RECTAL PAIN/PRESSURE/BLEEDING.   YOUR BIOPSY RESULTS WILL BE BACK IN 5 BUSINESS DAYS.  YOUR next colonoscopy WILL BE SCHEDULED BASED ON YOUR FINAL PATHOLOGY REPORT.    Colonoscopy Care After Read the instructions outlined below and refer to this sheet in the next week. These discharge instructions provide you with general information on caring for yourself after you leave the hospital. While your treatment has been planned according to the most current medical practices available, unavoidable complications occasionally occur. If you have any problems or questions after discharge, call DR. Deserai Cansler, [phone].  ACTIVITY  You may resume your regular activity, but move at a slower pace for the next 24 hours.   Take frequent rest periods for the next 24 hours.   Walking will help get rid of the air and reduce the bloated feeling in your belly (abdomen).   No driving for 24 hours (because of the medicine (anesthesia) used during the test).   You may shower.   Do not sign any important legal documents or operate any machinery for 24 hours (because of the anesthesia used during the test).    NUTRITION  Drink plenty of fluids.   You may resume your normal diet as instructed by your  doctor.   Begin with a light meal and progress to your normal diet. Heavy or fried foods are harder to digest and may make you feel sick to your stomach (nauseated).   Avoid alcoholic beverages for 24 hours or as instructed.    MEDICATIONS  You may resume your normal medications.   WHAT YOU CAN EXPECT TODAY  Some feelings of bloating in the abdomen.   Passage of more gas than usual.   Spotting of blood in your stool or on the toilet paper  .  IF YOU HAD POLYPS REMOVED DURING THE COLONOSCOPY:  Eat a soft diet IF YOU HAVE NAUSEA, BLOATING, ABDOMINAL PAIN, OR VOMITING.    FINDING OUT THE RESULTS OF YOUR TEST Not all test results are available during your visit. DR. Oneida Alar WILL CALL YOU WITHIN 14 DAYS OF YOUR PROCEDUE WITH YOUR RESULTS. Do not assume everything is normal if you have not heard from DR. Krystyne Tewksbury, CALL HER OFFICE AT [phone].  SEEK IMMEDIATE MEDICAL ATTENTION AND CALL THE OFFICE: [phone] IF:  You have more than a spotting of blood in your stool.   Your belly is swollen (abdominal distention).   You are nauseated or vomiting.   You have a temperature over 101F.   You have abdominal pain or discomfort that is severe or gets worse throughout the day.   High-Fiber Diet A high-fiber diet changes your normal diet to include more whole grains, legumes, fruits, and vegetables. Changes in the diet involve  replacing refined carbohydrates with unrefined foods. The calorie level of the diet is essentially unchanged. The Dietary Reference Intake (recommended amount) for adult males is 38 grams per day. For adult females, it is 25 grams per day. Pregnant and lactating women should consume 28 grams of fiber per day. Fiber is the intact part of a plant that is not broken down during digestion. Functional fiber is fiber that has been isolated from the plant to provide a beneficial effect in the body.  PURPOSE Increase stool bulk.  Ease and regulate bowel movements.    Lower cholesterol.  REDUCE RISK OF COLON CANCER  INDICATIONS THAT YOU NEED MORE FIBER Constipation and hemorrhoids.  Uncomplicated diverticulosis (intestine condition) and irritable bowel syndrome.  Weight management.  As a protective measure against hardening of the arteries (atherosclerosis), diabetes, and cancer.   GUIDELINES FOR INCREASING FIBER IN THE DIET Start adding fiber to the diet slowly. A gradual increase of about 5 more grams (2 servings of most fruits or vegetables) per day is best. Too rapid an increase in fiber may result in constipation, flatulence, and bloating.  Drink enough water and fluids to keep your urine clear or pale yellow. Water, juice, or caffeine-free drinks are recommended. Not drinking enough fluid may cause constipation.  Eat a variety of high-fiber foods rather than one type of fiber.  Try to increase your intake of fiber through using high-fiber foods rather than fiber pills or supplements that contain small amounts of fiber.  The goal is to change the types of food eaten. Do not supplement your present diet with high-fiber foods, but replace foods in your present diet.    Polyps, Colon  A polyp is extra tissue that grows inside your body. Colon polyps grow in the large intestine. The large intestine, also called the colon, is part of your digestive system. It is a long, hollow tube at the end of your digestive tract where your body makes and stores stool. Most polyps are not dangerous. They are benign. This means they are not cancerous. But over time, some types of polyps can turn into cancer. Polyps that are smaller than a pea are usually not harmful. But larger polyps could someday become or may already be cancerous. To be safe, doctors remove all polyps and test them.   WHO GETS POLYPS? Anyone can get polyps, but certain people are more likely than others. You may have a greater chance of getting polyps if:  You are over 50.   You have had polyps  before.   Someone in your family has had polyps.   Someone in your family has had cancer of the large intestine.   Find out if someone in your family has had polyps. You may also be more likely to get polyps if you:   Eat a lot of fatty foods   Smoke   Drink alcohol   Do not exercise  Eat too much   PREVENTION There is not one sure way to prevent polyps. You might be able to lower your risk of getting them if you:  Eat more fruits and vegetables and less fatty food.   Do not smoke.   Avoid alcohol.   Exercise every day.   Lose weight if you are overweight.   Eating more calcium and folate can also lower your risk of getting polyps. Some foods that are rich in calcium are milk, cheese, and broccoli. Some foods that are rich in folate are chickpeas, kidney beans, and  spinach.

## 2019-06-04 NOTE — Anesthesia Postprocedure Evaluation (Signed)
Anesthesia Post Note  Patient: [name]  Procedure(s) Performed: COLONOSCOPY WITH PROPOFOL (N/A ) POLYPECTOMY  Patient location during evaluation: Endoscopy Anesthesia Type: General Level of consciousness: awake and alert Pain management: pain level controlled Vital Signs Assessment: post-procedure vital signs reviewed and stable Respiratory status: spontaneous breathing, nonlabored ventilation, respiratory function stable and patient connected to nasal cannula oxygen Cardiovascular status: blood pressure returned to baseline and stable Postop Assessment: no apparent nausea or vomiting Anesthetic complications: no     Last Vitals:  Vitals:   06/04/19 1045 06/04/19 1046  BP: 116/67   Pulse: 75 73  Resp: 20 (!) 22  Temp:    SpO2: 99% 98%    Last Pain:  Vitals:   06/04/19 1045  TempSrc:   PainSc: 0-No pain                 Talitha Givens

## 2019-06-04 NOTE — H&P (Signed)
Primary Care Physician:  Rosita Fire, MD Primary Gastroenterologist:  Dr. Oneida Alar  Pre-Procedure History & Physical: HPI:  Melanie Cordova is a 59 y.o. female here for  [place].  Past Medical History:  Diagnosis Date  . Anxiety   . Arthritis   . Asthma   . Automatic implantable cardioverter-defibrillator in situ    2013, july  . Bell palsy    states has had 3 episodes  . Cardiomyopathy 08/2010   Presented with congestive heart failure; EF of 15% and 2012; hypotension on medication precludes optimal dosing  . CHF (congestive heart failure) ([employer])    a. EF 20% in 2012 with low-risk NST --> St. Jude ICD implantation by Dr. Lovena Le in 09/2011  . Chronic systolic heart failure ([place])   . COPD (chronic obstructive pulmonary disease) ([place])    2013  . Dysrhythmia   . Gastroesophageal reflux disease   . Headache(784.0)   . Hot flashes 10/04/2016  . Hyperlipidemia   . Hypertension    09/2010-normal CMet and CBC; Lipid profile-116, 88, 25, 73  . Hypothyroidism    Recent TSH was normal.  . 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 Neuromuscular disorder (HCC)    Neuropathy, right foot and leg  . Neuropathy   . Obesity   . Pneumonia   . Seizures ([place])    last one at age 81  . Shortness of breath   . Stroke [facility])     stroke in 07/2012 and another in June, 2014  . Tobacco abuse    20 pack years    Past Surgical History:  Procedure Laterality Date  . CESAREAN SECTION     X2  . COLONOSCOPY N/A 12/16/2013   One simple adenoma and 3 hyperplastic polyps. Small internal hemorrhoids. Left colon redundant. Due for surveillance 2020.   Marland Kitchen EP IMPLANTABLE DEVICE     St. Jude  . EP IMPLANTABLE DEVICE N/A 08/13/2015   Procedure: Loop Recorder Insertion;  Surgeon: Evans Lance, MD;  Location: [place] CV [facility];  Service: Cardiovascular;  Laterality: N/A;  . IMPLANTABLE CARDIOVERTER DEFIBRILLATOR IMPLANT N/A 10/10/2011   Procedure: IMPLANTABLE  CARDIOVERTER DEFIBRILLATOR IMPLANT;  Surgeon: Evans Lance, MD;  Location: [facility] CATH LAB;  Service: Cardiovascular;  Laterality: N/A;  . MULTIPLE EXTRACTIONS WITH ALVEOLOPLASTY N/A 09/24/2012   Procedure: MULTIPLE EXTRACION #2, 4, 6, 7 ,8, 9, 11, 13, 18, 20, 21, 22, 23, 24, 25, 26, 27, 29 WITH ALVEOLOPLASTY, BIOPSY OF PALATE LESION, REMOVA RIGHT LINGUAL TORUS;  Surgeon: Gae Bon, DDS;  Location: [place];  Service: Oral Surgery;  Laterality: N/A;  . TEE WITHOUT CARDIOVERSION N/A 07/11/2012   Procedure: TRANSESOPHAGEAL ECHOCARDIOGRAM (TEE);  Surgeon: Thayer Headings, MD;  Location: [place];  Service: Cardiovascular;  Laterality: N/A;  . TUBAL LIGATION      Prior to Admission medications   Medication Sig Start Date End Date Taking? Authorizing Provider  albuterol (PROVENTIL HFA;VENTOLIN HFA) 108 (90 BASE) MCG/ACT inhaler Inhale 2 puffs into the lungs every 6 (six) hours as needed for wheezing or shortness of breath. 07/12/12  Yes Ziemer, Kelli Hope, MD  aspirin EC 81 MG tablet Take 81 mg by mouth 2 (two) times daily.    Yes [provider]  Biotin 10000 MCG TABS Take 10,000 mcg by mouth daily.    Yes [provider]  budesonide-formoterol (SYMBICORT) 80-4.5 MCG/ACT inhaler Inhale 2 puffs into the lungs 2 (two) times daily.   Yes [provider]  carvedilol (COREG) 12.5 MG tablet Take 25 mg by mouth 2 (two) times daily with a meal.   Yes [provider]  cetirizine (ZYRTEC) 10 MG tablet Take 10 mg by mouth daily.   Yes [provider]  clobetasol (TEMOVATE) 0.05 % external solution Apply 1 application topically 2 (two) times daily as needed (Apply to scalp.).  07/24/15  Yes [provider]  diclofenac sodium (VOLTAREN) 1 % GEL Apply 2 g topically 4 (four) times daily as needed (pain).    Yes [provider]  diphenhydrAMINE (BENADRYL) 25 MG tablet Take 25 mg by mouth in the morning and at bedtime.    Yes [provider]   ENTRESTO 24-26 MG TAKE ONE TABLET BY MOUTH 2 TIMES A DAY Patient taking differently: Take 1 tablet by mouth 2 (two) times daily.  12/31/18  Yes Evans Lance, MD  famotidine (PEPCID) 20 MG tablet Take 20 mg by mouth daily as needed for heartburn or indigestion.   Yes [provider]  Ferrous Sulfate (IRON) 28 MG TABS Take 28 mg by mouth daily.    Yes [provider]  fluticasone (FLONASE) 50 MCG/ACT nasal spray Place 2 sprays into both nostrils daily.    Yes [provider]  furosemide (LASIX) 40 MG tablet Take 1 tablet (40 mg total) by mouth 2 (two) times daily. 08/29/12  Yes Rothbart, Cristopher Estimable, MD  gabapentin (NEURONTIN) 800 MG tablet Take 800 mg by mouth every 6 (six) hours as needed (pain).  10/30/15  Yes [provider]  ketoconazole (NIZORAL) 2 % cream Apply 1 application topically 2 (two) times daily.  10/21/15  Yes [provider]  levothyroxine (SYNTHROID, LEVOTHROID) 200 MCG tablet Take 1 tablet (200 mcg total) by mouth daily before breakfast. 03/01/18  Yes Nida, Marella Chimes, MD  Multiple Vitamins-Minerals (CENTRAVITES 50 PLUS PO) Take 1 tablet by mouth daily.    Yes [provider]  pantoprazole (PROTONIX) 20 MG tablet Take 1 tablet (20 mg total) by mouth daily. 06/21/18  Yes Strader, [place] M, PA-C  potassium chloride SA (K-DUR) 20 MEQ tablet Take 20 mEq by mouth daily.  05/23/18  Yes [provider]  psyllium (METAMUCIL) 58.6 % packet Take 1 packet by mouth daily.   Yes [provider]  rosuvastatin (CRESTOR) 20 MG tablet Take 20 mg by mouth daily.   Yes [provider]  Simethicone (GAS-X PO) Take 1 tablet by mouth in the morning and at bedtime.    Yes [provider]  Vitamin D, Ergocalciferol, (DRISDOL) 1.25 MG (50000 UNIT) CAPS capsule Take 50,000 Units by mouth every 30 (thirty) days.   Yes [provider]  Cholecalciferol (VITAMIN D3) 5000 units CAPS TAKE ONE CAPSULE BY MOUTH  DAILY Patient not taking: No sig reported 09/22/17   [name], MD    Allergies as of 02/20/2019 - Review Complete 02/20/2019  Allergen Reaction Noted  . Fish allergy Anaphylaxis 03/20/2012  . Iodinated diagnostic agents Hives and Other (See Comments) 09/24/2010  . Iodine Hives and Other (See Comments) 12/23/2010  . Lentil Anaphylaxis 03/20/2012  . Penicillins Anaphylaxis and Shortness Of Breath 05/04/2010  . Lipitor [atorvastatin] Itching and Rash 07/27/2012  . Spironolactone Rash 07/11/2011  . Sulfa antibiotics Other (See Comments) 05/04/2010    Family History  Problem Relation Age of Onset  . Cardiomyopathy Mother        ICD pacemaker-ischmic CM  . Heart failure Mother   . Hypertension Mother   .  Diabetes Mother   . Thyroid disease Mother   . Cardiomyopathy Father        Deceased  . Coronary artery disease Father   . Heart failure Father   . Heart failure Brother   . Hypertension Brother   . Endometriosis Sister   . Heart disease Paternal Grandfather   . Heart disease Paternal Grandmother   . Heart disease Maternal Grandmother   . Heart disease Maternal Grandfather   . Hypertension Daughter   . Obesity Daughter   . Colon cancer Neg Hx   . Liver disease Neg Hx     Social History   Socioeconomic History  . Marital status: Divorced    Spouse name: Not on file  . Number of children: 2  . Years of education: Not on file  . Highest education level: Not on file  Occupational History    Employer: [employer]    Comment: Works in Environmental consultant  Tobacco Use  . Smoking status: Current Every Day Smoker    Packs/day: 0.50    Years: 21.00    Pack years: 10.50    Types: Cigarettes    Start date: 07/02/1983  . Smokeless tobacco: Never Used  Substance and Sexual Activity  . Alcohol use: No    Alcohol/week: 0.0 standard drinks  . Drug use: No  . Sexual activity: Not Currently    Birth control/protection: Abstinence, Surgical    Comment: tubal   Other Topics Concern  . Not on file  Social History Narrative  . Not on file   Social Determinants of Health   Financial Resource Strain:   . Difficulty of Paying Living Expenses:   Food Insecurity:   . Worried About Charity fundraiser in the Last Year:   . Arboriculturist in the Last Year:   Transportation Needs:   . Film/video editor (Medical):   Marland Kitchen Lack of Transportation (Non-Medical):   Physical Activity:   . Days of Exercise per Week:   . Minutes of Exercise per Session:   Stress:   . Feeling of Stress :   Social Connections:   . Frequency of Communication with Friends and Family:   . Frequency of Social Gatherings with Friends and Family:   . Attends Religious Services:   . Active Member of Clubs or Organizations:   . Attends Archivist Meetings:   Marland Kitchen Marital Status:   Intimate Partner Violence:   . Fear of Current or Ex-Partner:   . Emotionally Abused:   Marland Kitchen Physically Abused:   . Sexually Abused:     Review of Systems: See HPI, otherwise negative ROS   Physical Exam: There were no vitals taken for this visit. General:   Alert,  pleasant and cooperative in NAD Head:  Normocephalic and atraumatic. Neck:  Supple; Lungs:  Clear throughout to auscultation.    Heart:  Regular rate and rhythm. Abdomen:  Soft, nontender and nondistended. Normal bowel sounds, without guarding, and without rebound.   Neurologic:  Alert and  oriented x4;  grossly normal neurologically.  Impression/Plan:      PERSONAL HISTORY OF POLYPS.  PLAN: 1. TCS TODAY. DISCUSSED PROCEDURE, BENEFITS, & RISKS: < 1% chance of medication reaction, bleeding, perforation, ASPIRATION, or rupture of spleen/liver requiring surgery to fix it and missed polyps < 1 cm 10-20% of the time.

## 2019-06-04 NOTE — Transfer of Care (Signed)
Immediate Anesthesia Transfer of Care Note  Patient: [name]  Procedure(s) Performed: COLONOSCOPY WITH PROPOFOL (N/A ) POLYPECTOMY  Patient Location: PACU  Anesthesia Type:General  Level of Consciousness: awake, alert  and oriented  Airway & Oxygen Therapy: Patient Spontanous Breathing  Post-op Assessment: Report given to RN, Post -op Vital signs reviewed and stable and Patient moving all extremities X 4  Post vital signs: Reviewed and stable  Last Vitals:  Vitals Value Taken Time  BP 99/83 06/04/19 1024  Temp    Pulse 78 06/04/19 1027  Resp 19 06/04/19 1027  SpO2 97 % 06/04/19 1027  Vitals shown include unvalidated device data.  Last Pain:  Vitals:   06/04/19 0932  TempSrc:   PainSc: 0-No pain         Complications: No apparent anesthesia complications

## 2019-06-04 NOTE — Anesthesia Preprocedure Evaluation (Addendum)
Anesthesia Evaluation  Patient identified by MRN, date of birth, ID band Patient awake    Reviewed: Allergy & Precautions, H&P , NPO status , Patient's Chart, lab work & pertinent test results, reviewed documented beta blocker date and time   Airway Mallampati: I  TM Distance: >3 FB Neck ROM: full    Dental  (+) Upper Dentures, Lower Dentures   Pulmonary shortness of breath, asthma , pneumonia, COPD,  COPD inhaler, Current Smoker,    Pulmonary exam normal breath sounds clear to auscultation       Cardiovascular Exercise Tolerance: Good hypertension, +CHF  Normal cardiovascular exam+ dysrhythmias + Cardiac Defibrillator      Neuro/Psych  Headaches, Seizures -,  PSYCHIATRIC DISORDERS Anxiety TIA Neuromuscular disease CVA    GI/Hepatic Neg liver ROS, GERD  ,  Endo/Other  Hypothyroidism   Renal/GU negative Renal ROS  negative genitourinary   Musculoskeletal   Abdominal   Peds  Hematology negative hematology ROS (+)   Anesthesia Other Findings   Reproductive/Obstetrics negative OB ROS                            Anesthesia Physical Anesthesia Plan  ASA: III  Anesthesia Plan: General   Post-op Pain Management:    Induction:   PONV Risk Score and Plan: 2 and Propofol infusion  Airway Management Planned:   Additional Equipment:   Intra-op Plan:   Post-operative Plan:   Informed Consent: I have reviewed the patients History and Physical, chart, labs and discussed the procedure including the risks, benefits and alternatives for the proposed anesthesia with the patient or authorized representative who has indicated his/her understanding and acceptance.     Dental Advisory Given  Plan Discussed with: CRNA  Anesthesia Plan Comments:         Anesthesia Quick Evaluation

## 2019-06-05 LAB — SURGICAL PATHOLOGY

## 2019-06-25 ENCOUNTER — Ambulatory Visit (INDEPENDENT_AMBULATORY_CARE_PROVIDER_SITE_OTHER): Payer: Medicare Other | Admitting: *Deleted

## 2019-06-25 DIAGNOSIS — I639 Cerebral infarction, unspecified: Secondary | ICD-10-CM | POA: Diagnosis not present

## 2019-06-26 LAB — CUP PACEART REMOTE DEVICE CHECK
Battery Remaining Longevity: 23 mo
Battery Remaining Percentage: 22 %
Battery Voltage: 2.78 V
Brady Statistic RV Percent Paced: 1 %
Date Time Interrogation Session: 20210414015022
HighPow Impedance: 83 Ohm
HighPow Impedance: 83 Ohm
Implantable Lead Implant Date: 20130729
Implantable Lead Location: 753860
Implantable Lead Model: 181
Implantable Lead Serial Number: 322336
Implantable Pulse Generator Implant Date: 20130729
Lead Channel Impedance Value: 610 Ohm
Lead Channel Pacing Threshold Amplitude: 0.75 V
Lead Channel Pacing Threshold Pulse Width: 0.5 ms
Lead Channel Sensing Intrinsic Amplitude: 12 mV
Lead Channel Setting Pacing Amplitude: 2.5 V
Lead Channel Setting Pacing Pulse Width: 0.5 ms
Lead Channel Setting Sensing Sensitivity: 0.5 mV
Pulse Gen Serial Number: 1022442

## 2019-06-26 NOTE — Progress Notes (Signed)
ICD Remote  

## 2019-07-02 ENCOUNTER — Emergency Department (HOSPITAL_COMMUNITY): Payer: Medicare Other

## 2019-07-02 ENCOUNTER — Inpatient Hospital Stay (HOSPITAL_COMMUNITY)
Admission: EM | Admit: 2019-07-02 | Discharge: 2019-07-03 | DRG: 062 | Disposition: A | Discharge status: Home Health | Payer: Medicare Other | Attending: Neurology | Admitting: Neurology

## 2019-07-02 ENCOUNTER — Other Ambulatory Visit: Payer: Self-pay

## 2019-07-02 ENCOUNTER — Encounter (HOSPITAL_COMMUNITY): Payer: Self-pay | Admitting: Emergency Medicine

## 2019-07-02 DIAGNOSIS — Z880 Allergy status to penicillin: Secondary | ICD-10-CM

## 2019-07-02 DIAGNOSIS — I6389 Other cerebral infarction: Secondary | ICD-10-CM | POA: Diagnosis not present

## 2019-07-02 DIAGNOSIS — G43909 Migraine, unspecified, not intractable, without status migrainosus: Secondary | ICD-10-CM | POA: Diagnosis present

## 2019-07-02 DIAGNOSIS — Z20822 Contact with and (suspected) exposure to covid-19: Secondary | ICD-10-CM | POA: Diagnosis present

## 2019-07-02 DIAGNOSIS — Z95810 Presence of automatic (implantable) cardiac defibrillator: Secondary | ICD-10-CM

## 2019-07-02 DIAGNOSIS — Z882 Allergy status to sulfonamides status: Secondary | ICD-10-CM

## 2019-07-02 DIAGNOSIS — I110 Hypertensive heart disease with heart failure: Secondary | ICD-10-CM | POA: Diagnosis present

## 2019-07-02 DIAGNOSIS — Z79890 Hormone replacement therapy (postmenopausal): Secondary | ICD-10-CM | POA: Diagnosis not present

## 2019-07-02 DIAGNOSIS — K219 Gastro-esophageal reflux disease without esophagitis: Secondary | ICD-10-CM | POA: Diagnosis present

## 2019-07-02 DIAGNOSIS — G629 Polyneuropathy, unspecified: Secondary | ICD-10-CM | POA: Diagnosis present

## 2019-07-02 DIAGNOSIS — G4700 Insomnia, unspecified: Secondary | ICD-10-CM | POA: Diagnosis present

## 2019-07-02 DIAGNOSIS — R29702 NIHSS score 2: Secondary | ICD-10-CM | POA: Diagnosis present

## 2019-07-02 DIAGNOSIS — Z7951 Long term (current) use of inhaled steroids: Secondary | ICD-10-CM

## 2019-07-02 DIAGNOSIS — Z720 Tobacco use: Secondary | ICD-10-CM | POA: Diagnosis present

## 2019-07-02 DIAGNOSIS — Z6841 Body Mass Index (BMI) 40.0 and over, adult: Secondary | ICD-10-CM

## 2019-07-02 DIAGNOSIS — E785 Hyperlipidemia, unspecified: Secondary | ICD-10-CM | POA: Diagnosis present

## 2019-07-02 DIAGNOSIS — Z8249 Family history of ischemic heart disease and other diseases of the circulatory system: Secondary | ICD-10-CM | POA: Diagnosis not present

## 2019-07-02 DIAGNOSIS — I255 Ischemic cardiomyopathy: Secondary | ICD-10-CM | POA: Diagnosis not present

## 2019-07-02 DIAGNOSIS — E782 Mixed hyperlipidemia: Secondary | ICD-10-CM | POA: Diagnosis not present

## 2019-07-02 DIAGNOSIS — Z888 Allergy status to other drugs, medicaments and biological substances status: Secondary | ICD-10-CM

## 2019-07-02 DIAGNOSIS — I429 Cardiomyopathy, unspecified: Secondary | ICD-10-CM | POA: Diagnosis present

## 2019-07-02 DIAGNOSIS — G459 Transient cerebral ischemic attack, unspecified: Secondary | ICD-10-CM | POA: Diagnosis present

## 2019-07-02 DIAGNOSIS — I10 Essential (primary) hypertension: Secondary | ICD-10-CM | POA: Diagnosis not present

## 2019-07-02 DIAGNOSIS — Z9282 Status post administration of tPA (rtPA) in a different facility within the last 24 hours prior to admission to current facility: Secondary | ICD-10-CM

## 2019-07-02 DIAGNOSIS — Z79899 Other long term (current) drug therapy: Secondary | ICD-10-CM | POA: Diagnosis not present

## 2019-07-02 DIAGNOSIS — J449 Chronic obstructive pulmonary disease, unspecified: Secondary | ICD-10-CM | POA: Diagnosis present

## 2019-07-02 DIAGNOSIS — R4781 Slurred speech: Secondary | ICD-10-CM | POA: Diagnosis present

## 2019-07-02 DIAGNOSIS — I639 Cerebral infarction, unspecified: Secondary | ICD-10-CM

## 2019-07-02 DIAGNOSIS — E039 Hypothyroidism, unspecified: Secondary | ICD-10-CM | POA: Diagnosis present

## 2019-07-02 DIAGNOSIS — Z7982 Long term (current) use of aspirin: Secondary | ICD-10-CM | POA: Diagnosis not present

## 2019-07-02 DIAGNOSIS — I5022 Chronic systolic (congestive) heart failure: Secondary | ICD-10-CM | POA: Diagnosis present

## 2019-07-02 DIAGNOSIS — Z8673 Personal history of transient ischemic attack (TIA), and cerebral infarction without residual deficits: Secondary | ICD-10-CM

## 2019-07-02 DIAGNOSIS — Z833 Family history of diabetes mellitus: Secondary | ICD-10-CM

## 2019-07-02 DIAGNOSIS — M1990 Unspecified osteoarthritis, unspecified site: Secondary | ICD-10-CM | POA: Diagnosis present

## 2019-07-02 DIAGNOSIS — F17210 Nicotine dependence, cigarettes, uncomplicated: Secondary | ICD-10-CM | POA: Diagnosis present

## 2019-07-02 DIAGNOSIS — Z91041 Radiographic dye allergy status: Secondary | ICD-10-CM

## 2019-07-02 DIAGNOSIS — Z91013 Allergy to seafood: Secondary | ICD-10-CM

## 2019-07-02 DIAGNOSIS — Z8349 Family history of other endocrine, nutritional and metabolic diseases: Secondary | ICD-10-CM

## 2019-07-02 LAB — URINALYSIS, ROUTINE W REFLEX MICROSCOPIC
Bilirubin Urine: NEGATIVE
Glucose, UA: NEGATIVE mg/dL
Ketones, ur: NEGATIVE mg/dL
Nitrite: NEGATIVE
Protein, ur: NEGATIVE mg/dL
Specific Gravity, Urine: 1.006 (ref 1.005–1.030)
pH: 7 (ref 5.0–8.0)

## 2019-07-02 LAB — COMPREHENSIVE METABOLIC PANEL
ALT: 21 U/L (ref 0–44)
AST: 17 U/L (ref 15–41)
Albumin: 3.8 g/dL (ref 3.5–5.0)
Alkaline Phosphatase: 85 U/L (ref 38–126)
Anion gap: 9 (ref 5–15)
BUN: 8 mg/dL (ref 6–20)
CO2: 31 mmol/L (ref 22–32)
Calcium: 8.9 mg/dL (ref 8.9–10.3)
Chloride: 102 mmol/L (ref 98–111)
Creatinine, Ser: 0.63 mg/dL (ref 0.44–1.00)
GFR calc Af Amer: >60 mL/min (ref >60)
GFR calc non Af Amer: >60 mL/min (ref >60)
Glucose, Bld: 111 mg/dL — ABNORMAL HIGH (ref 70–99)
Potassium: 3.8 mmol/L (ref 3.5–5.1)
Sodium: 142 mmol/L (ref 135–145)
Total Bilirubin: 0.4 mg/dL (ref 0.3–1.2)
Total Protein: 7.1 g/dL (ref 6.5–8.1)

## 2019-07-02 LAB — CBC
HCT: 46 % (ref 36.0–46.0)
Hemoglobin: 14.4 g/dL (ref 12.0–15.0)
MCH: 29.1 pg (ref 26.0–34.0)
MCHC: 31.3 g/dL (ref 30.0–36.0)
MCV: 93.1 fL (ref 80.0–100.0)
Platelets: 278 10*3/uL (ref 150–400)
RBC: 4.94 MIL/uL (ref 3.87–5.11)
RDW: 14 % (ref 11.5–15.5)
WBC: 12.1 10*3/uL — ABNORMAL HIGH (ref 4.0–10.5)
nRBC: 0 % (ref 0.0–0.2)

## 2019-07-02 LAB — I-STAT CHEM 8, ED
BUN: 6 mg/dL (ref 6–20)
Calcium, Ion: 1.16 mmol/L (ref 1.15–1.40)
Chloride: 101 mmol/L (ref 98–111)
Creatinine, Ser: 0.6 mg/dL (ref 0.44–1.00)
Glucose, Bld: 103 mg/dL — ABNORMAL HIGH (ref 70–99)
HCT: 44 % (ref 36.0–46.0)
Hemoglobin: 15 g/dL (ref 12.0–15.0)
Potassium: 3.8 mmol/L (ref 3.5–5.1)
Sodium: 142 mmol/L (ref 135–145)
TCO2: 32 mmol/L (ref 22–32)

## 2019-07-02 LAB — DIFFERENTIAL
Abs Immature Granulocytes: 0.08 10*3/uL — ABNORMAL HIGH (ref 0.00–0.07)
Basophils Absolute: 0.1 10*3/uL (ref 0.0–0.1)
Basophils Relative: 1 %
Eosinophils Absolute: 0.3 10*3/uL (ref 0.0–0.5)
Eosinophils Relative: 3 %
Immature Granulocytes: 1 %
Lymphocytes Relative: 20 %
Lymphs Abs: 2.4 10*3/uL (ref 0.7–4.0)
Monocytes Absolute: 0.9 10*3/uL (ref 0.1–1.0)
Monocytes Relative: 7 %
Neutro Abs: 8.4 10*3/uL — ABNORMAL HIGH (ref 1.7–7.7)
Neutrophils Relative %: 68 %

## 2019-07-02 LAB — RAPID URINE DRUG SCREEN, HOSP PERFORMED
Amphetamines: NOT DETECTED
Barbiturates: NOT DETECTED
Benzodiazepines: NOT DETECTED
Cocaine: NOT DETECTED
Opiates: NOT DETECTED
Tetrahydrocannabinol: NOT DETECTED

## 2019-07-02 LAB — RESPIRATORY PANEL BY RT PCR (FLU A&B, COVID)
Influenza A by PCR: NEGATIVE
Influenza B by PCR: NEGATIVE
SARS Coronavirus 2 by RT PCR: NEGATIVE

## 2019-07-02 LAB — PROTIME-INR
INR: 1 (ref 0.8–1.2)
Prothrombin Time: 13 seconds (ref 11.4–15.2)

## 2019-07-02 LAB — CBG MONITORING, ED: Glucose-Capillary: 116 mg/dL — ABNORMAL HIGH (ref 70–99)

## 2019-07-02 LAB — APTT: aPTT: 26 seconds (ref 24–36)

## 2019-07-02 LAB — ETHANOL: Alcohol, Ethyl (B): <10 mg/dL (ref <10)

## 2019-07-02 MED ORDER — FUROSEMIDE 40 MG PO TABS
40.0000 mg | ORAL_TABLET | Freq: Two times a day (BID) | ORAL | Status: DC
  Administered 2019-07-02 – 2019-07-03 (×3): 40 mg via ORAL
  Filled 2019-07-02 (×3): qty 1

## 2019-07-02 MED ORDER — FERROUS SULFATE 325 (65 FE) MG PO TABS
325.0000 mg | ORAL_TABLET | Freq: Every day | ORAL | Status: DC
  Administered 2019-07-03: 325 mg via ORAL
  Filled 2019-07-02: qty 1

## 2019-07-02 MED ORDER — ROSUVASTATIN CALCIUM 20 MG PO TABS
20.0000 mg | ORAL_TABLET | Freq: Every day | ORAL | Status: DC
  Administered 2019-07-02 – 2019-07-03 (×2): 20 mg via ORAL
  Filled 2019-07-02 (×2): qty 1

## 2019-07-02 MED ORDER — STROKE: EARLY STAGES OF RECOVERY BOOK
Freq: Once | Status: AC
  Filled 2019-07-02: qty 1

## 2019-07-02 MED ORDER — ACETAMINOPHEN 160 MG/5ML PO SOLN: 650.0000 mg | ORAL | Status: DC | PRN

## 2019-07-02 MED ORDER — PANTOPRAZOLE SODIUM 40 MG IV SOLR: 40.0000 mg | Freq: Every day | INTRAVENOUS | Status: DC

## 2019-07-02 MED ORDER — POTASSIUM CHLORIDE CRYS ER 20 MEQ PO TBCR
20.0000 meq | EXTENDED_RELEASE_TABLET | Freq: Every day | ORAL | Status: DC
  Administered 2019-07-03: 10:00:00 20 meq via ORAL
  Filled 2019-07-02: qty 1

## 2019-07-02 MED ORDER — ALTEPLASE 100 MG IV SOLR
INTRAVENOUS | Status: AC
  Administered 2019-07-02: 81 mg via INTRAVENOUS
  Filled 2019-07-02: qty 100

## 2019-07-02 MED ORDER — SODIUM CHLORIDE 0.9 % IV SOLN
50.0000 mL | Freq: Once | INTRAVENOUS | Status: AC
  Administered 2019-07-02: 50 mL via INTRAVENOUS

## 2019-07-02 MED ORDER — LEVOTHYROXINE SODIUM 100 MCG PO TABS
200.0000 ug | ORAL_TABLET | Freq: Every day | ORAL | Status: DC
  Administered 2019-07-03: 06:00:00 200 ug via ORAL
  Filled 2019-07-02: qty 2

## 2019-07-02 MED ORDER — MOMETASONE FURO-FORMOTEROL FUM 100-5 MCG/ACT IN AERO
2.0000 | INHALATION_SPRAY | Freq: Two times a day (BID) | RESPIRATORY_TRACT | Status: DC
  Administered 2019-07-03: 2 via RESPIRATORY_TRACT
  Filled 2019-07-02: qty 8.8

## 2019-07-02 MED ORDER — PANTOPRAZOLE SODIUM 20 MG PO TBEC
20.0000 mg | DELAYED_RELEASE_TABLET | Freq: Every day | ORAL | Status: DC
  Administered 2019-07-03: 10:00:00 20 mg via ORAL
  Filled 2019-07-02: qty 1

## 2019-07-02 MED ORDER — ACETAMINOPHEN 325 MG PO TABS: 650.0000 mg | ORAL_TABLET | ORAL | Status: DC | PRN

## 2019-07-02 MED ORDER — ACETAMINOPHEN 650 MG RE SUPP: 650.0000 mg | RECTAL | Status: DC | PRN

## 2019-07-02 MED ORDER — ALTEPLASE (STROKE) FULL DOSE INFUSION
90.0000 mg | Freq: Once | INTRAVENOUS | Status: AC
  Filled 2019-07-02: qty 100

## 2019-07-02 MED ORDER — SENNOSIDES-DOCUSATE SODIUM 8.6-50 MG PO TABS: 1.0000 | ORAL_TABLET | Freq: Every evening | ORAL | Status: DC | PRN

## 2019-07-02 MED ORDER — CARVEDILOL 12.5 MG PO TABS
25.0000 mg | ORAL_TABLET | Freq: Two times a day (BID) | ORAL | Status: DC
  Administered 2019-07-02 – 2019-07-03 (×3): 25 mg via ORAL
  Filled 2019-07-02 (×3): qty 2

## 2019-07-02 MED ORDER — SACUBITRIL-VALSARTAN 24-26 MG PO TABS
1.0000 | ORAL_TABLET | Freq: Two times a day (BID) | ORAL | Status: DC
  Administered 2019-07-02 – 2019-07-03 (×2): 1 via ORAL
  Filled 2019-07-02 (×3): qty 1

## 2019-07-02 MED ORDER — FLUTICASONE PROPIONATE 50 MCG/ACT NA SUSP
2.0000 | Freq: Every day | NASAL | Status: DC
  Administered 2019-07-03: 10:00:00 2 via NASAL
  Filled 2019-07-02: qty 16

## 2019-07-02 MED ORDER — ALBUTEROL SULFATE (2.5 MG/3ML) 0.083% IN NEBU: 3.0000 mL | INHALATION_SOLUTION | Freq: Four times a day (QID) | RESPIRATORY_TRACT | Status: DC | PRN

## 2019-07-02 MED ORDER — GABAPENTIN 400 MG PO CAPS
800.0000 mg | ORAL_CAPSULE | Freq: Four times a day (QID) | ORAL | Status: DC | PRN
  Administered 2019-07-02: 800 mg via ORAL
  Filled 2019-07-02: qty 2

## 2019-07-02 NOTE — Plan of Care (Addendum)
[facility] Neurohospitalist-phone call note  Called by ED provider Dr. Wilson Singer regarding this patient needing transfer to [facility] after receiving TPA at the [facility], ED.    Will accept to 3W  -- Amie [place], MD [facility] Pager: [phone] If 7pm to 7am, please call on call as listed on AMION.

## 2019-07-02 NOTE — ED Provider Notes (Signed)
[facility] EMERGENCY DEPARTMENT Provider Note   CSN: [id number] Arrival date & time: 07/02/19  1135  An emergency department physician performed an initial assessment on this suspected stroke patient at 1145.  History Chief Complaint  Patient presents with  . Code Stroke    Melanie Cordova is a 59 y.o. female.  HPI   59 year old female with facial droop/slurred speech.  Approximately 1/2 hours prior to arrival.  Denies any acute symptoms with it otherwise.  Specifically, no numbness, tingling or focal loss of strength.  No acute visual complaints.  Also complaining some pain in the left neck, shoulder and arm.  This began yesterday has been pretty constant.  No appreciable exacerbating factors.  No shortness of breath.  Past Medical History:  Diagnosis Date  . Anxiety   . Arthritis   . Asthma   . Automatic implantable cardioverter-defibrillator in situ    2013, july  . Bell palsy    states has had 3 episodes  . Cardiomyopathy 08/2010   Presented with congestive heart failure; EF of 15% and 2012; hypotension on medication precludes optimal dosing  . CHF (congestive heart failure) ([place])    a. EF 20% in 2012 with low-risk NST --> St. Jude ICD implantation by Dr. Lovena Le in 09/2011  . Chronic systolic heart failure ([place])   . COPD (chronic obstructive pulmonary disease) ([name])    2013  . Dysrhythmia   . Gastroesophageal reflux disease   . Headache(784.0)   . Hot flashes 10/04/2016  . Hyperlipidemia   . Hypertension    09/2010-normal CMet and CBC; Lipid profile-116, 88, 25, 73  . Hypothyroidism    Recent TSH was normal.  . 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 Neuromuscular disorder (HCC)    Neuropathy, right foot and leg  . Neuropathy   . Obesity   . Pneumonia   . Seizures ([place])    last one at age 70  . Shortness of breath   . Stroke [facility])     stroke in 07/2012 and another in June, 2014  . Tobacco abuse    20 pack years    Patient Active  Problem List   Diagnosis Date Noted  . Colorectal polyps   . History of colonic polyps 02/20/2019  . Constipation 02/20/2019  . Chest pain 12/28/2017  . Rectocele 10/04/2016  . Vaginal discharge 10/04/2016  . Screening for colorectal cancer 10/04/2016  . Hot flashes 10/04/2016  . Vitamin D deficiency 02/26/2016  . Cryptogenic stroke ([place]) 08/13/2015  . Weakness of face muscles 07/25/2015  . Morbid obesity due to excess calories ([place]) 03/05/2015  . Rectal bleeding 11/05/2013  . Chronic systolic heart failure ([place]) 05/15/2013  . Asthma 04/29/2013  . Migraine variant 11/24/2012  . Weakness 11/15/2012  . Gingival disease 07/25/2012  . TIA (transient ischemic attack) 11/07/2011  . Automatic implantable cardioverter-defibrillator in situ 10/12/2011  . Hypokalemia 10/01/2011  . Tobacco abuse   . Hyperlipidemia   . Hypertension   . Gastroesophageal reflux disease   . Hypothyroidism   . Cardiomyopathy ([place]) 08/13/2010    Past Surgical History:  Procedure Laterality Date  . CESAREAN SECTION     X2  . COLONOSCOPY N/A 12/16/2013   One simple adenoma and 3 hyperplastic polyps. Small internal hemorrhoids. Left colon redundant. Due for surveillance 2020.   Marland Kitchen COLONOSCOPY WITH PROPOFOL N/A 06/04/2019   Procedure: COLONOSCOPY WITH PROPOFOL;  Surgeon: Danie Binder, MD;  [facility] END[facility];  Service: Endoscopy;  Laterality:  N/A;  10:30am  . EP IMPLANTABLE DEVICE     St. Jude  . EP IMPLANTABLE DEVICE N/A 08/13/2015   Procedure: Loop Recorder Insertion;  Surgeon: Evans Lance, MD;  Location: [place] CV LAB;  Service: Cardiovascular;  Laterality: N/A;  . IMPLANTABLE CARDIOVERTER DEFIBRILLATOR IMPLANT N/A 10/10/2011   Procedure: IMPLANTABLE CARDIOVERTER DEFIBRILLATOR IMPLANT;  Surgeon: Evans Lance, MD;  Location: [facility] C[facility] LAB;  Service: Cardiovascular;  Laterality: N/A;  . MULTIPLE EXTRACTIONS WITH ALVEOLOPLASTY N/A 09/24/2012   Procedure: MULTIPLE EXTRACION #2, 4, 6, 7 ,8, 9, 11, 13,  18, 20, 21, 22, 23, 24, 25, 26, 27, 29 WITH ALVEOLOPLASTY, BIOPSY OF PALATE LESION, REMOVA RIGHT LINGUAL TORUS;  Surgeon: Gae Bon, DDS;  Location: [place];  Service: Oral Surgery;  Laterality: N/A;  . POLYPECTOMY  06/04/2019   Procedure: POLYPECTOMY;  Surgeon: Danie Binder, MD;  Location[facility];  Service: Endoscopy;;  . TEE WITHOUT CARDIOVERSION N/A 07/11/2012   Procedure: TRANSESOPHAGEAL ECHOCARDIOGRAM (TEE);  Surgeon: Thayer Headings, MD;  Location[facility] ENDOSCOPY;  Service: Cardiovascular;  Laterality: N/A;  . TUBAL LIGATION       OB History    Gravida  3   Para  3   Term  3   Preterm      AB      Living  3     SAB      TAB      Ectopic      Multiple      Live Births  3           Family History  Problem Relation Age of Onset  . Cardiomyopathy Mother        ICD pacemaker-ischmic CM  . Heart failure Mother   . Hypertension Mother   . Diabetes Mother   . Thyroid disease Mother   . Cardiomyopathy Father        Deceased  . Coronary artery disease Father   . Heart failure Father   . Heart failure Brother   . Hypertension Brother   . Endometriosis Sister   . Heart disease Paternal Grandfather   . Heart disease Paternal Grandmother   . Heart disease Maternal Grandmother   . Heart disease Maternal Grandfather   . Hypertension Daughter   . Obesity Daughter   . Colon cancer Neg Hx   . Liver disease Neg Hx     Social History   Tobacco Use  . Smoking status: Current Every Day Smoker    Packs/day: 0.50    Years: 21.00    Pack years: 10.50    Types: Cigarettes    Start date: 07/02/1983  . Smokeless tobacco: Never Used  Substance Use Topics  . Alcohol use: No    Alcohol/week: 0.0 standard drinks  . Drug use: No    Home Medications Prior to Admission medications   Medication Sig Start Date End Date Taking? Authorizing Provider  albuterol (PROVENTIL HFA;VENTOLIN HFA) 108 (90 BASE) MCG/ACT inhaler Inhale 2 puffs into the lungs every 6 (six)  hours as needed for wheezing or shortness of breath. 07/12/12  Yes Ziemer, Kelli Hope, MD  aspirin EC 81 MG tablet Take 81 mg by mouth 2 (two) times daily.    Yes [provider]  Biotin 10000 MCG TABS Take 10,000 mcg by mouth daily.    Yes [provider]  budesonide-formoterol (SYMBICORT) 80-4.5 MCG/ACT inhaler Inhale 2 puffs into the lungs 2 (two) times daily.   Yes [provider]  carvedilol (COREG) 12.5 MG tablet Take 25 mg by mouth 2 (two) times daily with a meal.   Yes [provider]  cetirizine (ZYRTEC) 10 MG tablet Take 10 mg by mouth daily.   Yes [provider]  Cholecalciferol (VITAMIN D3) 5000 units CAPS TAKE ONE CAPSULE BY MOUTH DAILY 09/22/17  Yes Nida, Marella Chimes, MD  clobetasol (TEMOVATE) 0.05 % external solution Apply 1 application topically 2 (two) times daily as needed (Apply to scalp.).  07/24/15  Yes [provider]  diclofenac sodium (VOLTAREN) 1 % GEL Apply 2 g topically 4 (four) times daily as needed (pain).    Yes [provider]  diphenhydrAMINE (BENADRYL) 25 MG tablet Take 25 mg by mouth in the morning and at bedtime.    Yes [provider]  ENTRESTO 24-26 MG TAKE ONE TABLET BY MOUTH 2 TIMES A DAY Patient taking differently: Take 1 tablet by mouth 2 (two) times daily.  12/31/18  Yes Evans Lance, MD  famotidine (PEPCID) 20 MG tablet Take 20 mg by mouth daily as needed for heartburn or indigestion.   Yes [provider]  Ferrous Sulfate (IRON) 28 MG TABS Take 28 mg by mouth daily.    Yes [provider]  fluticasone (FLONASE) 50 MCG/ACT nasal spray Place 2 sprays into both nostrils daily.    Yes [provider]  furosemide (LASIX) 40 MG tablet Take 1 tablet (40 mg total) by mouth 2 (two) times daily. 08/29/12  Yes Rothbart, Cristopher Estimable, MD  gabapentin (NEURONTIN) 800 MG tablet Take 800 mg by mouth every 6 (six) hours as needed (pain).  10/30/15  Yes [provider]    ketoconazole (NIZORAL) 2 % cream Apply 1 application topically 2 (two) times daily.  10/21/15  Yes [provider]  levothyroxine (SYNTHROID, LEVOTHROID) 200 MCG tablet Take 1 tablet (200 mcg total) by mouth daily before breakfast. 03/01/18  Yes Nida, Marella Chimes, MD  Multiple Vitamins-Minerals (CENTRAVITES 50 PLUS PO) Take 1 tablet by mouth daily.    Yes [provider]  pantoprazole (PROTONIX) 20 MG tablet Take 1 tablet (20 mg total) by mouth daily. 06/21/18  Yes Strader, [place] M, PA-C  potassium chloride SA (K-DUR) 20 MEQ tablet Take 20 mEq by mouth daily.  05/23/18  Yes [provider]  rosuvastatin (CRESTOR) 20 MG tablet Take 20 mg by mouth daily.   Yes [provider]  Simethicone (GAS-X PO) Take 1 tablet by mouth in the morning and at bedtime.    Yes [provider]  Vitamin D, Ergocalciferol, (DRISDOL) 1.25 MG (50000 UNIT) CAPS capsule Take 50,000 Units by mouth every 30 (thirty) days.   Yes [provider]  psyllium (METAMUCIL) 58.6 % packet Take 1 packet by mouth daily.    [provider]    Allergies    Fish allergy, Iodinated diagnostic agents, Iodine, Lentil, Penicillins, Lipitor [atorvastatin], Spironolactone, and Sulfa antibiotics  Review of Systems   Review of Systems All systems reviewed and negative, other than as noted in HPI.  Physical Exam Updated Vital Signs BP 98/74   Pulse 77   Temp 98.4 F (36.9 C) (Oral)   Resp 17   Wt 132.6 kg   SpO2 98%   BMI 45.80 kg/m   Physical Exam Vitals and nursing note reviewed.  Constitutional:      General: She is not in acute distress.    Appearance: She is well-developed.  HENT:     Head: Normocephalic and  atraumatic.  Eyes:     General:        Right eye: No discharge.        Left eye: No discharge.     Conjunctiva/sclera: Conjunctivae normal.  Cardiovascular:     Rate and Rhythm: Normal rate and regular rhythm.     Heart sounds: Normal heart sounds.  No murmur. No friction rub. No gallop.   Pulmonary:     Effort: Pulmonary effort is normal. No respiratory distress.     Breath sounds: Normal breath sounds.  Abdominal:     General: There is no distension.     Palpations: Abdomen is soft.     Tenderness: There is no abdominal tenderness.  Musculoskeletal:        General: No tenderness.     Cervical back: Neck supple.  Skin:    General: Skin is warm and dry.  Neurological:     Mental Status: She is alert.     Comments: Left facial droop.  Cranial nerves II through XII otherwise intact.  Strength out of 5 bilateral upper and lower extremities.  Comparing this testing bilaterally.  Psychiatric:        Behavior: Behavior normal.        Thought Content: Thought content normal.     ED Results / Procedures / Treatments   Labs (all labs ordered are listed, but only abnormal results are displayed) Labs Reviewed  CBC - Abnormal; Notable for the following components:      Result Value   WBC 12.1 (*)    All other components within normal limits  DIFFERENTIAL - Abnormal; Notable for the following components:   Neutro Abs 8.4 (*)    Abs Immature Granulocytes 0.08 (*)    All other components within normal limits  COMPREHENSIVE METABOLIC PANEL - Abnormal; Notable for the following components:   Glucose, Bld 111 (*)    All other components within normal limits  HEMOGLOBIN A1C - Abnormal; Notable for the following components:   Hgb A1c MFr Bld 5.7 (*)    All other components within normal limits  LIPID PANEL - Abnormal; Notable for the following components:   Triglycerides 189 (*)    HDL 37 (*)    All other components within normal limits  URINALYSIS, ROUTINE W REFLEX MICROSCOPIC - Abnormal; Notable for the following components:   APPearance HAZY (*)    Hgb urine dipstick SMALL (*)    Leukocytes,Ua MODERATE (*)    Bacteria, UA RARE (*)    All other components within normal limits  I-STAT CHEM 8, ED - Abnormal; Notable for the  following components:   Glucose, Bld 103 (*)    All other components within normal limits  CBG MONITORING, ED - Abnormal; Notable for the following components:   Glucose-Capillary 116 (*)    All other components within normal limits  RESPIRATORY PANEL BY RT PCR (FLU A&B, COVID)  ETHANOL  PROTIME-INR  APTT  HIV ANTIBODY (ROUTINE TESTING W REFLEX)  RAPID URINE DRUG SCREEN, HOSP PERFORMED  POC URINE PREG, ED    EKG None  Radiology CT HEAD CODE STROKE WO CONTRAST  Result Date: 07/02/2019 CLINICAL DATA:  Code stroke. 59 year old female with left mouth drooping, left upper extremity pain. EXAM: CT HEAD WITHOUT CONTRAST TECHNIQUE: Contiguous axial images were obtained from the base of the skull through the vertex without intravenous contrast. COMPARISON:  Brain MRI 11/17/2010. Head CT 07/31/2015. FINDINGS: Brain: Cerebral volume remains normal. There is a tiny chronic infarct in  the left cerebellum unchanged since 20/12 (series 2, image 7). Elsewhere normal gray-white matter differentiation. No midline shift, ventriculomegaly, mass effect, evidence of mass lesion, intracranial hemorrhage or evidence of cortically based acute infarction. Vascular: Calcified atherosclerosis at the skull base. No suspicious intracranial vascular hyperdensity. Skull: Stable, negative. Sinuses/Orbits: Visualized paranasal sinuses and mastoids are stable and well pneumatized. Other: Visualized orbits and scalp soft tissues are within normal limits. ASPECTS [facility] Stroke Program Early CT Score) Total score (0-10 with 10 being normal): 10 IMPRESSION: 1. No acute cortically based infarct or acute Intracranial hemorrhage identified. ASPECTS 10. 2. Tiny chronic infarct in the left cerebellum unchanged since 2012. 3. Study discussed by telephone with Dr. Virgel Manifold on 07/02/2019 at 12:08 . Electronically Signed   By: Genevie Ann M.D.   On: 07/02/2019 12:09    Procedures Procedures (including critical care time)  Medications  Ordered in ED Medications  alteplase (ACTIVASE) 1 mg/mL infusion 90 mg (81 mg Intravenous New Bag/Given 07/02/19 1222)    Followed by  0.9 %  sodium chloride infusion (50 mLs Intravenous New Bag/Given 07/02/19 1312)    ED Course  I have reviewed the triage vital signs and the nursing notes.  Pertinent labs & imaging results that were available during my care of the patient were reviewed by me and considered in my medical decision making (see chart for details).    MDM Rules/Calculators/A&P                      59 year old female with strokelike symptoms.  Evaluated by neurology as a code stroke.  Recommended TPA.  Transfer to [facility] hospital for further management/work-up. Final Clinical Impression(s) / ED Diagnoses Final diagnoses:  Cerebrovascular accident (CVA), unspecified mechanism ([place])  Received intravenous tissue plasminogen activator (tPA) in emergency department    Rx / DC Orders ED Discharge Orders    None       Virgel Manifold, MD 07/05/19 1109

## 2019-07-02 NOTE — Consult Note (Addendum)
[facility] Tele[facility]urology Consult Services   Date of Service:   07/02/2019 11:52:53  Impression:     .  I63.9 - Cerebrovascular accident (CVA), unspecified mechanism ([place])  Comments/Sign-Out: Patients clinical features can be compatible with diagnosis of acute ischemic stroke however other vascular and non-vascular conditions that present with an acute neurological deficit simulating acute ischemic stroke is possible. Chief differential include: Recrudescence of facial palsy (pt received COVID vaccination yesterday)  Metrics: Last Known Well: 07/02/2019 10:00:53 [employer] Notification Time: 07/02/2019 11:52:53 Arrival Time: 07/02/2019 11:35:53 Stamp Time: 07/02/2019 11:52:53 Time First Login Attempt: 07/02/2019 11:54:26 Symptoms: left facial droop NIHSS Start Assessment Time: 07/02/2019 12:01:22 Alteplase Early Mix Decision Time: 07/02/2019 12:14:22 Patient is a candidate for Alteplase/Activase. Alteplase Medical Decision: 07/02/2019 12:14:22 Alteplase/Activase CPOE Order Time: 07/02/2019 12:20:31 Needle Time: 07/02/2019 12:22:27 Weight Noted by Staff: 132 kg Reason for Alteplase/Activase Delay: Other descriptives  CT head showed no acute hemorrhage or acute core infarct.  Clinical Presentation is not Suggestive of Large Vessel Occlusive Disease  Radiologist was not called back for review of advanced imaging because NA ED Physician notified of diagnostic impression and management plan on 07/02/2019 12:26:48  Alteplase/Activase Contraindications:  Last Known Well > 4.5 hours: No CT Head showing hemorrhage: No Ischemic stroke within 3 months: No Severe head trauma within 3 months: No Intracranial/intraspinal surgery within 3 months: No History of intracranial hemorrhage: No Symptoms and signs consistent with an SAH: No GI malignancy or GI bleed within 21 days: No Coagulopathy: Platelets <100 000/mm3, INR >1.7, aPTT>40 s, or PT >15 s: No Treatment  dose of LMWH within the previous 24 hrs: No Use of NOACs in past 48 hours: No Glycoprotein IIb/IIIa receptor inhibitors use: No Symptoms consistent with infective endocarditis: No Suspected aortic arch dissection: No Intra-axial intracranial neoplasm: No  Verbal Consent to Alteplase/Activase: I have explained to the Patient the nature of the patient's condition, reviewed the indications and contraindications to the use of Alteplase/Activase fibrinolytic agent, reviewed the indications and contraindications and the benefits to be reasonably expected compared with alternative approaches. I have discussed the likelihood of major risks or complications of this procedure including (if applicable) but not limited to loss of limb function, brain damage, paralysis, hemorrhage, infection, complications from transfusion of blood components, drug reactions, blood clots and loss of life. I have also indicated that with any procedure there is always the possibility of an unexpected complication. All questions were answered and Patient express understanding of the treatment plan and consent to the treatment.  Our recommendations are outlined below.  Recommendations: IV Alteplase/Activase recommended.  Alteplase/Activase bolus given Without Complication.   IV Alteplase/Activase Total Dose - 90.0 mg IV Alteplase/Activase Bolus Dose - 9.0 mg IV Alteplase/Activase Infusion Dose - 81.0 mg  Routine post Alteplase/Activase monitoring including neuro checks and blood pressure control during/after treatment Monitor blood pressure Check blood pressure and neuro assessment every 15 min for 2 h, then every 30 min for 6 h, and finally every hour for 16 h.  Manage Blood Pressure per post Alteplase/Activase protocol.      .  Admission to ICU     .  CT brain 24 hours post Alteplase/Activase     .  NPO until swallowing screen performed and passed     .  No antiplatelet agents or anticoagulants (including heparin for  DVT prophylaxis) in first 24 hours     .  No Foley catheter, nasogastric tube, arterial catheter or central venous catheter for 24 hr, unless absolutely necessary     .  Telemetry     .  Bedside swallow evaluation     .  HOB less than 30 degrees     .  Euglycemia     .  Avoid hyperthermia, PRN acetaminophen     .  DVT prophylaxis     .  Inpatient Neurology Consultation     .  Stroke evaluation as per inpatient neurology recommendations  Discussed with ED physician    ------------------------------------------------------------------------------  History of Present Illness: Patient is a 59 year old Female.  Patient was brought by EMS for symptoms of left facial droop  Patient seen in ED Arrival time:11:35, EMS Information obtained from patient h/o defibrillator, smoker, CVA, bells palsy left side, DLD, CHF Chronology: LKW 10:00 am Yesterday pt had left arm pain, pt then had chest pain today. Pt woke up today at 5:00 am and looked at her face apparently was at baseline 10:00 am she checked the mirror and noted facial droop left side Medications: ASA, gabapentin, protonix,Zyrtec,synthroid, lasix,carvediliol, crestor,K, Fe, MV,Albuterol,symbecort, BP:121/76 P:78 Blood glucose:116  Last seen normal was within 4.5 hours. There is no history of hemorrhagic complications or intracranial hemorrhage. There is no history of Recent Anticoagulants. There is no history of recent major surgery. There is no history of recent stroke.  Past Medical History:     . Hypertension     . Hyperlipidemia     . Stroke     Examination: BP(121/76), Pulse(78), Blood Glucose(116) 1A: Level of Consciousness - Alert; keenly responsive + 0 1B: Ask Month and Age - Both Questions Right + 0 1C: Blink Eyes & Squeeze Hands - Performs Both Tasks + 0 2: Test Horizontal Extraocular Movements - Normal + 0 3: Test Visual Fields - No Visual Loss + 0 4: Test Facial Palsy (Use Grimace if Obtunded) - Minor paralysis  (flat nasolabial fold, smile asymmetry) + 1 5A: Test Left Arm Motor Drift - No Drift for 10 Seconds + 0 5B: Test Right Arm Motor Drift - No Drift for 10 Seconds + 0 6A: Test Left Leg Motor Drift - No Drift for 5 Seconds + 0 6B: Test Right Leg Motor Drift - No Drift for 5 Seconds + 0 7: Test Limb Ataxia (FNF/Heel-Shin) - No Ataxia + 0 8: Test Sensation - Mild-Moderate Loss: Less Sharp/More Dull + 1 9: Test Language/Aphasia - Normal; No aphasia + 0 10: Test Dysarthria - Normal + 0 11: Test Extinction/Inattention - No abnormality + 0  NIHSS Score: 2  Pre-Morbid Modified Ranking Scale: 1 Points = No significant disability despite symptoms; able to carry out all usual duties and activities   Patient/Family was informed the Neurology Consult would occur via TeleHealth consult by way of interactive audio and video telecommunications and consented to receiving care in this manner.   Due to the immediate potential for life-threatening deterioration due to underlying acute neurologic illness, I spent 35 minutes providing critical care. This time includes time for face to face visit via telemedicine, review of medical records, imaging studies and discussion of findings with providers, the patient and/or family.   Dr Suzi Roots Tierre Gerard   [facility] [phone]  Case [id number]

## 2019-07-02 NOTE — Progress Notes (Signed)
Pt arrived to the unit. A/O x4, As soon as mask was taken off, no droop was noticed, but as soon as she started to speak to MD, Left side droop was present. After talking to the MD(at the bedside), the facial droop was on and off. Not present at this time. Slight decrease of sensation to the  Right (not left) extremities and Face. "Rt side feels lighter, Left side feels like you pushing it harder" No drift, slight disarthria. All the symptoms subsided after 1700. VS stable. Per MD to do swallow eval at the bedside. Done and diet orders are in. Pt is eating at this time. No active bleeding noted. Bag of medications are at the bedside(in the closet). Pharm tech will come and reconcile all the medications at the bedside. Home Health Nurse is her friend as well. Present. Pt is ok to share her status.  Bed in low position, alarms are on, call bell in reach, education/stroke book was given. Continue to monitor per order.

## 2019-07-02 NOTE — ED Notes (Signed)
Per Neurologist keep pt npo for now and dont do swallow screen, d/t pt felt like her throat was closing up.  Assessed pt mouth and throat with no abnormalities observed.

## 2019-07-02 NOTE — Progress Notes (Signed)
11:46a  call time 11:47a  beeper time 11:55a  exam started 12:00p  exam finished 12:00p  images sent to [facility] 12:01p  exam completed in [place] radiology called

## 2019-07-02 NOTE — ED Triage Notes (Signed)
Pt reports chest pain off and on since last night.  Reports left arm pain since yesterday morning.  Pt says noticed left sided mouth drooping approx 1 1/2 hours ago.  Has history of bells palsy but says did not have any residual facial droop.  EDP notified.  Pt also says got her second covid shot yesterday.

## 2019-07-02 NOTE — ED Notes (Signed)
Beeped through [facility] Neurology to [phone].

## 2019-07-02 NOTE — H&P (Addendum)
NEURO HOSPITALIST  H&P   Chief Complaint: left facial droop, left arm pain  History obtained from:  Patient   HPI:                                                                                                                                         Melanie Cordova is an 59 y.o. female  With PMH seizures (last one age 72), HTN, HLD, tobacco abuse, systolic CHF, COPD, implanted defibrillator (systolic HF), bell's palsy x 3 (could not tell me which side it affected )who presented to [facility] for left facial droop.  Patient stated that yesterday afternoon she had left arm pain and chest tightness. She thought that she was tired so she showered and went to bed. When she woke up this morning about 0500 the left arm pain was still present, but the chest tightness had subsided. After she took her morning medications and ate breakfast she felt that " little men where in her mouth and pulling strings". She saw on her phone that she had left facial droop. Denies vision changes, unusual SOB, ETOH, blood thinners. Endorses smoking. At baseline she uses a cane to ambulate. She does live alone, but has a nurse that checks on her daily because of her disability from her CHF.  Hospital course:  TPA given at [facility]: started at [facility]: no hemorrhage BP: 136/89 BG: 103 Seen by tele neurology at [facility], initial NIHSS 2 for sensation and facial droop. Transferred to [facility] ED [place] s/p TPA LKW: unclear - pt was totally symptom free at around 10 am on 4/19 but to the teleneurologist LKW was given to be 10am on 07/02/2019 tPA Given:yes-  given at [facility] by teleneuro Modified Rankin: Rankin Score=1 NIHSS:2 sensation and facial droop   Past Medical History:  Diagnosis Date  . Anxiety   . Arthritis   . Asthma   . Automatic implantable cardioverter-defibrillator in situ    2013, july  . Bell palsy    states has had 3 episodes  . Cardiomyopathy 08/2010   Presented with congestive  heart failure; EF of 15% and 2012; hypotension on medication precludes optimal dosing  . CHF (congestive heart failure) ([place])    a. EF 20% in 2012 with low-risk NST --> St. Jude ICD implantation by Dr. Lovena Le in 09/2011  . Chronic systolic heart failure ([place])   . COPD (chronic obstructive pulmonary disease) ([place])    2013  . Dysrhythmia   . Gastroesophageal reflux disease   . Headache(784.0)   . Hot flashes 10/04/2016  . Hyperlipidemia   . Hypertension    09/2010-normal CMet  and CBC; Lipid profile-116, 88, 25, 73  . Hypothyroidism    Recent TSH was normal.  . 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 Neuromuscular disorder (HCC)    Neuropathy, right foot and leg  . Neuropathy   . Obesity   . Pneumonia   . Seizures ([place])    last one at age 33  . Shortness of breath   . Stroke [facility])     stroke in 07/2012 and another in June, 2014  . Tobacco abuse    20 pack years    Past Surgical History:  Procedure Laterality Date  . CESAREAN SECTION     X2  . COLONOSCOPY N/A 12/16/2013   One simple adenoma and 3 hyperplastic polyps. Small internal hemorrhoids. Left colon redundant. Due for surveillance 2020.   Marland Kitchen COLONOSCOPY WITH PROPOFOL N/A 06/04/2019   Procedure: COLONOSCOPY WITH PROPOFOL;  Surgeon: Danie Binder, MD;  [facility]O [facility];  Service: Endoscopy;  Laterality: N/A;  10:30am  . EP IMPLANTABLE DEVICE     St. Jude  . EP IMPLANTABLE DEVICE N/A 08/13/2015   Procedure: Loop Recorder Insertion;  Surgeon: Evans Lance, MD;  Location: [place] CV LAB;  Service: Cardiovascular;  Laterality: N/A;  . IMPLANTABLE CARDIOVERTER DEFIBRILLATOR IMPLANT N/A 10/10/2011   Procedure: IMPLANTABLE CARDIOVERTER DEFIBRILLATOR IMPLANT;  Surgeon: Evans Lance, MD;  Location: [facility] CATH LAB;  Service: Cardiovascular;  Laterality: N/A;  . MULTIPLE EXTRACTIONS WITH ALVEOLOPLASTY N/A 09/24/2012   Procedure: MULTIPLE EXTRACION #2, 4, 6, 7 ,8, 9, 11, 13, 18, 20, 21, 22, 23, 24, 25, 26,  27, 29 WITH ALVEOLOPLASTY, BIOPSY OF PALATE LESION, REMOVA RIGHT LINGUAL TORUS;  Surgeon: Gae Bon, DDS;  Location: [place];  Service: Oral Surgery;  Laterality: N/A;  . POLYPECTOMY  06/04/2019   Procedure: POLYPECTOMY;  Surgeon: Danie Binder, MD;  Location: [facility];  Service: Endoscopy;;  . TEE WITHOUT CARDIOVERSION N/A 07/11/2012   Procedure: TRANSESOPHAGEAL ECHOCARDIOGRAM (TEE);  Surgeon: Thayer Headings, MD;  Location: [facility] ENDOSCOPY;  Service: Cardiovascular;  Laterality: N/A;  . TUBAL LIGATION      Family History  Problem Relation Age of Onset  . Cardiomyopathy Mother        ICD pacemaker-ischmic CM  . Heart failure Mother   . Hypertension Mother   . Diabetes Mother   . Thyroid disease Mother   . Cardiomyopathy Father        Deceased  . Coronary artery disease Father   . Heart failure Father   . Heart failure Brother   . Hypertension Brother   . Endometriosis Sister   . Heart disease Paternal Grandfather   . Heart disease Paternal Grandmother   . Heart disease Maternal Grandmother   . Heart disease Maternal Grandfather   . Hypertension Daughter   . Obesity Daughter   . Colon cancer Neg Hx   . Liver disease Neg Hx          Social History:  reports that she has been smoking cigarettes. She started smoking about 36 years ago. She has a 10.50 pack-year smoking history. She has never used smokeless tobacco. She reports that she does not drink alcohol or use drugs.  Allergies:  Allergies  Allergen Reactions  . Fish Allergy Anaphylaxis  . Iodinated Diagnostic Agents Hives and Other (See Comments)    Pulmonary problems; no frank respiratory arrest  . Iodine Hives and Other (See Comments)    Pulmonary Problems   . Lentil Anaphylaxis  .  Penicillins Anaphylaxis and Shortness Of Breath    Has patient had a PCN reaction causing immediate rash, facial/tongue/throat swelling, SOB or lightheadedness with hypotension: Yes Has patient had a PCN reaction causing severe rash  involving mucus membranes or skin necrosis: No Has patient had a PCN reaction that required hospitalization No Has patient had a PCN reaction occurring within the last 10 years: No If all of the above answers are "NO", then may proceed with Cephalosporin use.  Hair loss  . Lipitor [Atorvastatin] Itching and Rash  . Spironolactone Rash  . Sulfa Antibiotics Other (See Comments)    Unknown    Medications:                                                                                                                           no medications at this time.  ROS:                                                                                                                                       ROS was performed and is negative except as noted in HPI   General Examination:                                                                                                      Blood pressure 136/69, pulse 78, temperature 98 F (36.7 C), temperature source Oral, resp. rate 19, weight 132.6 kg, SpO2 100 %.  Physical Exam  Constitutional: Appears well-developed and well-nourished.  Psych: Affect appropriate to situation Eyes: Normal external eye and conjunctiva. HENT: Normocephalic, no lesions, without obvious abnormality.   Musculoskeletal-no joint tenderness, deformity or swelling Cardiovascular: Normal rate and regular rhythm.  Respiratory: Effort normal, non-labored breathing saturations WNL GI: Soft.  No distension. There is no tenderness.  Skin: WDI  Neurological Examination Mental Status: Alert, oriented, thought content appropriate. Slight dysarthria Speech fluent without evidence of aphasia.  Able to follow  step commands without difficulty. Cranial  Nerves: II: Visual fields grossly normal,  III,IV, VI: ptosis not present, extra-ocular motions intact bilaterally, pupils equal, round, reactive to light and accommodation V,VII: smile symmetric, left facial droop present sometimes at  rest or when she starts talking then corrects itself. facial light touch sensation decreased on the right.  She is able to close both her eyes very tightly without me being able to open them. VIII: hearing normal bilaterally IX,X: uvula rises midline XI: bilateral shoulder shrug XII: midline tongue extension Motor: Right : Upper extremity   5/5 Left:     Upper extremity   5/5  Lower extremity   5/5  Lower extremity   5/5 Tone and bulk:normal tone throughout; no atrophy noted Sensory: LT decreased on the right Cerebellar: No ataxia Gait: deferred   Lab Results: Basic Metabolic Panel: Recent Labs  Lab 07/02/19 1149 07/02/19 1154  NA 142 142  K 3.8 3.8  CL 102 101  CO2 31  --   GLUCOSE 111* 103*  BUN 8 6  CREATININE 0.63 0.60  CALCIUM 8.9  --     CBC: Recent Labs  Lab 07/02/19 1149 07/02/19 1154  WBC 12.1*  --   NEUTROABS 8.4*  --   HGB 14.4 15.0  HCT 46.0 44.0  MCV 93.1  --   PLT 278  --     CBG: Recent Labs  Lab 07/02/19 1151  GLUCAP 116*    Imaging: CT HEAD CODE STROKE WO CONTRAST  Result Date: 07/02/2019 CLINICAL DATA:  Code stroke. 59 year old female with left mouth drooping, left upper extremity pain. EXAM: CT HEAD WITHOUT CONTRAST TECHNIQUE: Contiguous axial images were obtained from the base of the skull through the vertex without intravenous contrast. COMPARISON:  Brain MRI 11/17/2010. Head CT 07/31/2015. FINDINGS: Brain: Cerebral volume remains normal. There is a tiny chronic infarct in the left cerebellum unchanged since 20/12 (series 2, image 7). Elsewhere normal gray-white matter differentiation. No midline shift, ventriculomegaly, mass effect, evidence of mass lesion, intracranial hemorrhage or evidence of cortically based acute infarction. Vascular: Calcified atherosclerosis at the skull base. No suspicious intracranial vascular hyperdensity. Skull: Stable, negative. Sinuses/Orbits: Visualized paranasal sinuses and mastoids are stable and well  pneumatized. Other: Visualized orbits and scalp soft tissues are within normal limits. ASPECTS [facility] Stroke Program Early CT Score) Total score (0-10 with 10 being normal): 10 IMPRESSION: 1. No acute cortically based infarct or acute Intracranial hemorrhage identified. ASPECTS 10. 2. Tiny chronic infarct in the left cerebellum unchanged since 2012. 3. Study discussed by telephone with Dr. Virgel Manifold on 07/02/2019 at 12:08 . Electronically Signed   By: Genevie Ann M.D.   On: 07/02/2019 12:09     Laurey Morale, MSN, NP-C [facility] [phone]  07/02/2019, 3:54 PM   Attending physician note to follow with Assessment and plan .   Assessment: 59 y.o. female with PMH seizures (last one age 66), HTN, HLD, tobacco abuse, COPD, implanted defibrillator ( systolic HF), bell's palsy x 3 who presented to [facility] for facial droop and left arm pain.  Exam showed facial asymmetry, code stroke activated and seen by teleneurology. CTH: no hemorrhage. Patient did receive TPA at [facility] by tele. Transferred to [facility] ED for post TPA care. Stroke Risk Factors - hyperlipidemia, hypertension and smoking  She provides conflicting LKW - to [place] it was yesterday in the morning, and to tele it was 10 am today.  Her exam also is inconsistent - face drawing on left disappears with distraction and speaking. Sensory symptoms are  all on the right. Difficult to localize - can not rule out a brainstem or thalamic lacunar stroke. Unfortunately, MRI might not be possible as AICD was 2013 - will check with MRI though to be double sure.  Plan:  CVA -- BP goal :  post tPA is 180/154mmHg,  --MRI Brain if ICD is compatible --CTA head and neck would have been ideal but she has allergy to contrast.  Will order carotid Dopplers. --Echocardiogram --No antiplatelet or anticoagulant for 24 hours after TPA. -- High intensity Statin if LDL > 70 -- HgbA1c, fasting lipid panel -- PT consult, OT consult, Speech consult --Telemetry  monitoring --Frequent neuro checks --Stroke swallow screen -- strict bedrest for 24 hours  HTN BP goal 180/105 Labetalol PRN and call Neurologist on call if needed  HLD Lipid panel  Goal LDL < 70  COPD: albuterol PRN for SOB  Hypothyroid: Continue home synthroid  Insomnia -Continue home gabapentin 800 nightly  Tobacco abuse Smoking cessation - counseled in detail at bedside.  Verbalized importance of smoking cessation.  Will work with outpatient primary care and cardiologist.  Constipation prophylaxis: Doc/senna  DVT prophylaxis: SCD's only  Code status: Full code  Discharge Disposition: likely home   --Please page the Stroke team from 8am-4pm.   You can look them up on [email or web address]    Attending Neurohospitalist Addendum Patient seen and examined with APP/Resident. Agree with the history and physical as documented above. Agree with the plan as documented, which I helped formulate. I have independently reviewed the chart, obtained history, review of systems and examined the patient.I have personally reviewed pertinent head/neck/spine imaging (CT/MRI). Please feel free to call with any questions. --- Amie Portland, MD [facility] Pager: [phone]  If 7pm to 7am, please call on call as listed on AMION.  CRITICAL CARE ATTESTATION Performed by: Amie Portland, MD Total critical care time: 31minutes Critical care time was exclusive of separately billable procedures and treating other patients and/or supervising APPs/Residents/Students Critical care was necessary to treat or prevent imminent or life-threatening deterioration due to acute ischemic stroke, status post thrombolytic administration. This patient is critically ill and at significant risk for neurological worsening and/or death and care requires constant monitoring. Critical care was time spent personally by me on the following activities: development of treatment plan with patient and/or  surrogate as well as nursing, discussions with consultants, evaluation of patient's response to treatment, examination of patient, obtaining history from patient or surrogate, ordering and performing treatments and interventions, ordering and review of laboratory studies, ordering and review of radiographic studies, pulse oximetry, re-evaluation of patient's condition, participation in multidisciplinary rounds and medical decision making of high complexity in the care of this patient.

## 2019-07-03 ENCOUNTER — Inpatient Hospital Stay (HOSPITAL_COMMUNITY): Payer: Medicare Other

## 2019-07-03 DIAGNOSIS — I255 Ischemic cardiomyopathy: Secondary | ICD-10-CM

## 2019-07-03 DIAGNOSIS — E782 Mixed hyperlipidemia: Secondary | ICD-10-CM

## 2019-07-03 DIAGNOSIS — I639 Cerebral infarction, unspecified: Secondary | ICD-10-CM

## 2019-07-03 DIAGNOSIS — I10 Essential (primary) hypertension: Secondary | ICD-10-CM

## 2019-07-03 DIAGNOSIS — Z9282 Status post administration of tPA (rtPA) in a different facility within the last 24 hours prior to admission to current facility: Secondary | ICD-10-CM

## 2019-07-03 DIAGNOSIS — Z720 Tobacco use: Secondary | ICD-10-CM

## 2019-07-03 DIAGNOSIS — I6389 Other cerebral infarction: Secondary | ICD-10-CM

## 2019-07-03 DIAGNOSIS — I5022 Chronic systolic (congestive) heart failure: Secondary | ICD-10-CM

## 2019-07-03 DIAGNOSIS — G459 Transient cerebral ischemic attack, unspecified: Principal | ICD-10-CM

## 2019-07-03 LAB — LIPID PANEL
Cholesterol: 152 mg/dL (ref 0–200)
HDL: 37 mg/dL — ABNORMAL LOW (ref >40)
LDL Cholesterol: 77 mg/dL (ref 0–99)
Total CHOL/HDL Ratio: 4.1 RATIO
Triglycerides: 189 mg/dL — ABNORMAL HIGH (ref <150)
VLDL: 38 mg/dL (ref 0–40)

## 2019-07-03 LAB — HEMOGLOBIN A1C
Hgb A1c MFr Bld: 5.7 % — ABNORMAL HIGH (ref 4.8–5.6)
Mean Plasma Glucose: 116.89 mg/dL

## 2019-07-03 LAB — ECHOCARDIOGRAM COMPLETE
Height: 67.5 in
Weight: 4642.01 oz

## 2019-07-03 LAB — HIV ANTIBODY (ROUTINE TESTING W REFLEX): HIV Screen 4th Generation wRfx: NONREACTIVE

## 2019-07-03 MED ORDER — CLOPIDOGREL BISULFATE 75 MG PO TABS: 75.0000 mg | ORAL_TABLET | Freq: Every day | ORAL | 0 refills | Status: AC

## 2019-07-03 MED ORDER — CLOPIDOGREL BISULFATE 75 MG PO TABS
75.0000 mg | ORAL_TABLET | Freq: Every day | ORAL | Status: DC
  Administered 2019-07-03: 19:00:00 75 mg via ORAL
  Filled 2019-07-03: qty 1

## 2019-07-03 MED ORDER — ASPIRIN EC 81 MG PO TBEC: 81.0000 mg | DELAYED_RELEASE_TABLET | Freq: Every day | ORAL | 0 refills | Status: AC

## 2019-07-03 MED ORDER — ASPIRIN EC 81 MG PO TBEC: 81.0000 mg | DELAYED_RELEASE_TABLET | Freq: Two times a day (BID) | ORAL | Status: DC

## 2019-07-03 NOTE — Discharge Instructions (Signed)
Continue your home medications including aspirin 81 daily and crestor 20 daily We added plavix 75mg  daily for you for 21 days for stroke prevention. Medication has prescribed to your pharmacy. You should able to pick up from tomorrow.  Continue follow up with your family doctor Healthy eating and regular exercise.  Neurology will see you in 4-6 weeks for follow up. Please call the number give to you to make appointment.     Transient Ischemic Attack   A transient ischemic attack (TIA) is a "warning stroke" that causes stroke-like symptoms that go away quickly. A TIA does not cause lasting damage to the brain. But having a TIA is a sign that you may be at risk for a stroke. Lifestyle changes and medical treatments can help prevent a stroke. It is important to know the symptoms of a TIA and what to do. Get help right away, even if your symptoms go away. The symptoms of a TIA are the same as those of a stroke. They can happen fast, and they usually go away within minutes or hours. They can include:  Weakness or loss of feeling in your face, arm, or leg. This often happens on one side of your body.  Trouble walking.  Trouble moving your arms or legs.  Trouble talking or understanding what people are saying.  Trouble seeing.  Seeing two of one object (double vision).  Feeling dizzy.  Feeling confused.  Loss of balance or coordination.  Feeling sick to your stomach (nauseous) and throwing up (vomiting).  A very bad headache for no reason. What increases the risk? Certain things may make you more likely to have a TIA. Some of these are things that you can change, such as:  Being very overweight (obese).  Using products that contain nicotine or tobacco, such as cigarettes and e-cigarettes.  Taking birth control pills.  Not being active.  Drinking too much alcohol.  Using drugs. Other risk factors include:  Having an irregular heartbeat (atrial fibrillation).  Being  African American or Hispanic.  Having had blood clots, stroke, TIA, or heart attack in the past.  Being a woman with a history of high blood pressure in pregnancy (preeclampsia).  Being over the age of 76.  Being female.  Having family history of stroke.  Having the following diseases or conditions: ? High blood pressure. ? High cholesterol. ? Diabetes. ? Heart disease. ? Sickle cell disease. ? Sleep apnea. ? Migraine headache. ? Long-term (chronic) diseases that cause soreness and swelling (inflammation). ? Disorders that affect how your blood clots. Follow these instructions at home: Medicines    Take over-the-counter and prescription medicines only as told by your doctor.  If you were told to take aspirin or another medicine to thin your blood, take it exactly as told by your doctor. ? Taking too much of the medicine can cause bleeding. ? Taking too little of the medicine may not work to treat the problem. Eating and drinking    Eat 5 or more servings of fruits and vegetables each day.  Follow instructions from your doctor about your diet. You may need to follow a certain diet to help lower your risk of having a stroke. You may need to: ? Eat a diet that is low in fat and salt. ? Eat foods that contain a lot of fiber. ? Limit the amount of carbohydrates and sugar in your diet.  Limit alcohol intake to 1 drink a day for nonpregnant women and 2 drinks a  day for men. One drink equals 12 oz of beer, 5 oz of wine, or 1 oz of hard liquor. General instructions  Keep a healthy weight.  Stay active. Try to get at least 30 minutes of activity on all or most days.  Find out if you have a condition called sleep apnea. Get treatment if needed.  Do not use any products that contain nicotine or tobacco, such as cigarettes and e-cigarettes. If you need help quitting, ask your doctor.  Do not abuse drugs.  Keep all follow-up visits as told by your doctor. This is  important. Get help right away if:  You have any signs of stroke. "BE FAST" is an easy way to remember the main warning signs: ? B - Balance. Signs are dizziness, sudden trouble walking, or loss of balance. ? E - Eyes. Signs are trouble seeing or a sudden change in how you see. ? F - Face. Signs are sudden weakness or loss of feeling of the face, or the face or eyelid drooping on one side. ? A - Arms. Signs are weakness or loss of feeling in an arm. This happens suddenly and usually on one side of the body. ? S - Speech. Signs are sudden trouble speaking, slurred speech, or trouble understanding what people say. ? T - Time. Time to call emergency services. Write down what time symptoms started.  You have other signs of stroke, such as: ? A sudden, very bad headache with no known cause. ? Feeling sick to your stomach (nausea). ? Throwing up (vomiting). ? Jerky movements that you cannot control (seizure). These symptoms may be an emergency. Do not wait to see if the symptoms will go away. Get medical help right away. Call your local emergency services (911 in the [place] Do not drive yourself to the hospital. Summary  A transient ischemic attack (TIA) is a "warning stroke" that causes stroke-like symptoms that go away quickly.  A TIA is a medical emergency. Get help right away, even if your symptoms go away.  A TIA does not cause lasting damage to the brain.  Having a TIA is a sign that you may be at risk for a stroke. Lifestyle changes and medical treatments can help prevent a stroke. This information is not intended to replace advice given to you by your health care provider. Make sure you discuss any questions you have with your health care provider. Document Revised: 11/24/2017 Document Reviewed: 06/01/2016 Elsevier Patient Education  [place].

## 2019-07-03 NOTE — Progress Notes (Signed)
SLP Cancellation Note  Patient Details Name: Melanie Cordova MRN: [id number] DOB: May 06, 1960   Cancelled treatment:       Reason Eval/Treat Not Completed: SLP screened, no needs identified, will sign off   Newt Levingston, Katherene Ponto 07/03/2019, 12:37 PM

## 2019-07-03 NOTE — Progress Notes (Signed)
OT Screen Note  Patient Details Name: Melanie Cordova MRN: [id number] DOB: 1960/08/07   Cancelled Treatment:    Reason Eval/Treat Not Completed: OT screened, no needs identified, will sign off(spoke with PT Katie) PT self reports back to baseline.  Billey Chang, OTR/L  Acute Rehabilitation Services Pager: [phone] Office: [phone] .  07/03/2019, 4:14 PM

## 2019-07-03 NOTE — Discharge Summary (Signed)
Stroke Discharge Summary  Patient ID: Melanie Cordova   MRN: [id number]      DOB: Feb 11, 1961  Date of Admission: 07/02/2019 Date of Discharge: 07/03/2019  Attending Physician:  Rosalin Hawking, MD, Stroke MD Consultant(s):    None  Patient's PCP:  Rosita Fire, MD  DISCHARGE DIAGNOSIS:  Principal Problem:   TIA (transient ischemic attack) s/p tPA Active Problems:   Hypertension   Cardiomyopathy ([place])   Hypothyroidism   Tobacco abuse   Hyperlipidemia   Chronic systolic heart failure ([place])   Morbid obesity due to excess calories ([place])   Allergies as of 07/03/2019      Reactions   Fish Allergy Anaphylaxis   Iodinated Diagnostic Agents Hives, Other (See Comments)   Pulmonary problems; no frank respiratory arrest   Iodine Hives, Other (See Comments)   Pulmonary Problems    Lentil Anaphylaxis   Penicillins Anaphylaxis, Shortness Of Breath   Has patient had a PCN reaction causing immediate rash, facial/tongue/throat swelling, SOB or lightheadedness with hypotension: Yes Has patient had a PCN reaction causing severe rash involving mucus membranes or skin necrosis: No Has patient had a PCN reaction that required hospitalization No Has patient had a PCN reaction occurring within the last 10 years: No If all of the above answers are "NO", then may proceed with Cephalosporin use. Hair loss   Lipitor [atorvastatin] Itching, Rash   Spironolactone Rash   Sulfa Antibiotics Other (See Comments)   Unknown      Medication List    TAKE these medications   albuterol 108 (90 Base) MCG/ACT inhaler Commonly known as: VENTOLIN HFA Inhale 2 puffs into the lungs every 6 (six) hours as needed for wheezing or shortness of breath.   aspirin EC 81 MG tablet Take 1 tablet (81 mg total) by mouth daily. What changed: when to take this   Biotin 10000 MCG Tabs Take 10,000 mcg by mouth daily.   budesonide-formoterol 80-4.5 MCG/ACT inhaler Commonly known as: SYMBICORT Inhale 2 puffs into the  lungs 2 (two) times daily.   carvedilol 12.5 MG tablet Commonly known as: COREG Take 25 mg by mouth 2 (two) times daily with a meal.   CENTRAVITES 50 PLUS PO Take 1 tablet by mouth daily.   cetirizine 10 MG tablet Commonly known as: ZYRTEC Take 10 mg by mouth daily.   clobetasol 0.05 % external solution Commonly known as: TEMOVATE Apply 1 application topically 2 (two) times daily as needed (Apply to scalp.).   clopidogrel 75 MG tablet Commonly known as: PLAVIX Take 1 tablet (75 mg total) by mouth daily.   diclofenac sodium 1 % Gel Commonly known as: VOLTAREN Apply 2 g topically 4 (four) times daily as needed (pain).   diphenhydrAMINE 25 MG tablet Commonly known as: BENADRYL Take 25 mg by mouth in the morning and at bedtime.   Entresto 24-26 MG Generic drug: sacubitril-valsartan TAKE ONE TABLET BY MOUTH 2 TIMES A DAY What changed: See the new instructions.   famotidine 20 MG tablet Commonly known as: PEPCID Take 20 mg by mouth daily as needed for heartburn or indigestion.   fluticasone 50 MCG/ACT nasal spray Commonly known as: FLONASE Place 2 sprays into both nostrils daily.   furosemide 40 MG tablet Commonly known as: LASIX Take 1 tablet (40 mg total) by mouth 2 (two) times daily.   gabapentin 800 MG tablet Commonly known as: NEURONTIN Take 800 mg by mouth every 6 (six) hours as needed (pain).   GAS-X PO  Take 1 tablet by mouth in the morning and at bedtime.   Iron 28 MG Tabs Take 28 mg by mouth daily.   ketoconazole 2 % cream Commonly known as: NIZORAL Apply 1 application topically 2 (two) times daily.   levothyroxine 200 MCG tablet Commonly known as: SYNTHROID Take 1 tablet (200 mcg total) by mouth daily before breakfast.   pantoprazole 20 MG tablet Commonly known as: PROTONIX Take 1 tablet (20 mg total) by mouth daily.   potassium chloride SA 20 MEQ tablet Commonly known as: KLOR-CON Take 20 mEq by mouth daily.   psyllium 58.6 %  packet Commonly known as: METAMUCIL Take 1 packet by mouth daily.   rosuvastatin 20 MG tablet Commonly known as: CRESTOR Take 20 mg by mouth daily.   Vitamin D (Ergocalciferol) 1.25 MG (50000 UNIT) Caps capsule Commonly known as: DRISDOL Take 50,000 Units by mouth every 30 (thirty) days.   Vitamin D3 125 MCG (5000 UT) Caps TAKE ONE CAPSULE BY MOUTH DAILY       LABORATORY STUDIES CBC    Component Value Date/Time   WBC 12.1 (H) 07/02/2019 1149   RBC 4.94 07/02/2019 1149   HGB 15.0 07/02/2019 1154   HCT 44.0 07/02/2019 1154   PLT 278 07/02/2019 1149   MCV 93.1 07/02/2019 1149   MCH 29.1 07/02/2019 1149   MCHC 31.3 07/02/2019 1149   RDW 14.0 07/02/2019 1149   LYMPHSABS 2.4 07/02/2019 1149   MONOABS 0.9 07/02/2019 1149   EOSABS 0.3 07/02/2019 1149   BASOSABS 0.1 07/02/2019 1149   CMP    Component Value Date/Time   NA 142 07/02/2019 1154   K 3.8 07/02/2019 1154   CL 101 07/02/2019 1154   CO2 31 07/02/2019 1149   GLUCOSE 103 (H) 07/02/2019 1154   BUN 6 07/02/2019 1154   CREATININE 0.60 07/02/2019 1154   CREATININE 0.70 02/06/2018 1020   CALCIUM 8.9 07/02/2019 1149   PROT 7.1 07/02/2019 1149   ALBUMIN 3.8 07/02/2019 1149   AST 17 07/02/2019 1149   ALT 21 07/02/2019 1149   ALKPHOS 85 07/02/2019 1149   BILITOT 0.4 07/02/2019 1149   GFRNONAA >60 07/02/2019 1149   GFRNONAA 86 02/19/2016 1013   GFRAA >60 07/02/2019 1149   GFRAA >89 02/19/2016 1013   COAGS Lab Results  Component Value Date   INR 1.0 07/02/2019   INR 1.03 07/31/2015   INR 0.98 07/25/2015   Lipid Panel    Component Value Date/Time   CHOL 152 07/03/2019 0416   TRIG 189 (H) 07/03/2019 0416   HDL 37 (L) 07/03/2019 0416   CHOLHDL 4.1 07/03/2019 0416   VLDL 38 07/03/2019 0416   LDLCALC 77 07/03/2019 0416   HgbA1C  Lab Results  Component Value Date   HGBA1C 5.7 (H) 07/03/2019   Urinalysis    Component Value Date/Time   COLORURINE YELLOW 07/02/2019 1717   APPEARANCEUR HAZY (A) 07/02/2019  1717   LABSPEC 1.006 07/02/2019 1717   PHURINE 7.0 07/02/2019 1717   GLUCOSEU NEGATIVE 07/02/2019 1717   HGBUR SMALL (A) 07/02/2019 1717   BILIRUBINUR NEGATIVE 07/02/2019 1717   KETONESUR NEGATIVE 07/02/2019 1717   PROTEINUR NEGATIVE 07/02/2019 1717   UROBILINOGEN 0.2 04/29/2013 1702   NITRITE NEGATIVE 07/02/2019 1717   LEUKOCYTESUR MODERATE (A) 07/02/2019 1717   Urine Drug Screen     Component Value Date/Time   LABOPIA NONE DETECTED 07/02/2019 1718   COCAINSCRNUR NONE DETECTED 07/02/2019 1718   LABBENZ NONE DETECTED 07/02/2019 1718   AMPHETMU NONE DETECTED  07/02/2019 1718   THCU NONE DETECTED 07/02/2019 1718   LABBARB NONE DETECTED 07/02/2019 1718    Alcohol Level    Component Value Date/Time   ETH <10 07/02/2019 1149     SIGNIFICANT DIAGNOSTIC STUDIES CT HEAD WO CONTRAST  Result Date: 07/03/2019 CLINICAL DATA:  Stroke follow-up 24 are status post tPA. EXAM: CT HEAD WITHOUT CONTRAST TECHNIQUE: Contiguous axial images were obtained from the base of the skull through the vertex without intravenous contrast. COMPARISON:  07/02/2019 FINDINGS: Brain: There is no evidence of acute infarct, intracranial hemorrhage, mass, midline shift, or extra-axial fluid collection. A subcentimeter chronic left cerebellar infarct is again noted. The ventricles are normal in size. Vascular: Calcified atherosclerosis at the skull base. No hyperdense vessel. Skull: No fracture or suspicious osseous lesion. Sinuses/Orbits: Paranasal sinuses and mastoid air cells are clear. Unremarkable orbits. Other: None. IMPRESSION: 1. No evidence of acute intracranial abnormality. 2. Chronic cerebellar infarct. Electronically Signed   By: Logan Bores M.D.   On: 07/03/2019 17:29   ECHOCARDIOGRAM COMPLETE  Result Date: 07/03/2019    ECHOCARDIOGRAM REPORT   Patient Name:   ARDYCE IRELAN Date of Exam: 07/03/2019 Medical Rec #:  [id number]          Height:       67.5 in Accession #:    [id number]         Weight:        290.1 lb Date of Birth:  February 21, 1961          BSA:          2.382 m Patient Age:    29 years           BP:           122/58 mmHg Patient Gender: F                  HR:           83 bpm. Exam Location:  Inpatient Procedure: 2D Echo, Cardiac Doppler and Color Doppler Indications:    Stroke 434.91 / I163.9  History:        Patient has prior history of Echocardiogram examinations, most                 recent 12/28/2017. CHF and Cardiomyopathy, Defibrillator, COPD;                 Risk Factors:Hypertension, Diabetes, Dyslipidemia, Current                 Smoker and GERD. Bell's Palsy. Seizures. Hypothyroidism.  Sonographer:    Jonelle Sidle Dance Referring Phys: [id number] [place]  1. Left ventricular ejection fraction, by estimation, is 45 to 50%. The left ventricle has mildly decreased function. Left ventricular endocardial border not optimally defined to evaluate regional wall motion. There is mild concentric left ventricular hypertrophy. Left ventricular diastolic parameters are consistent with Grade I diastolic dysfunction (impaired relaxation).  2. Right ventricular systolic function is normal. The right ventricular size is mildly enlarged. Tricuspid regurgitation signal is inadequate for assessing PA pressure.  3. The mitral valve is grossly normal. Trivial mitral valve regurgitation. No evidence of mitral stenosis.  4. The aortic valve is grossly normal. Aortic valve regurgitation is not visualized. No aortic stenosis is present.  5. The inferior vena cava is normal in size with greater than 50% respiratory variability, suggesting right atrial pressure of 3 mmHg. Comparison(s): Prior images unable to be directly viewed, comparison made by report only.  Changes from prior study are noted. The left ventricular function has improved. Conclusion(s)/Recommendation(s): No intracardiac source of embolism detected on this transthoracic study. A transesophageal echocardiogram is recommended to exclude cardiac  source of embolism if clinically indicated. FINDINGS  Left Ventricle: Left ventricular ejection fraction, by estimation, is 45 to 50%. The left ventricle has mildly decreased function. Left ventricular endocardial border not optimally defined to evaluate regional wall motion. The left ventricular internal cavity size was normal in size. There is mild concentric left ventricular hypertrophy. Left ventricular diastolic parameters are consistent with Grade I diastolic dysfunction (impaired relaxation). Right Ventricle: The right ventricular size is mildly enlarged. No increase in right ventricular wall thickness. Right ventricular systolic function is normal. Tricuspid regurgitation signal is inadequate for assessing PA pressure. Left Atrium: Left atrial size was normal in size. Right Atrium: Right atrial size was normal in size. Pericardium: Trivial pericardial effusion is present. The pericardial effusion is circumferential. Presence of pericardial fat pad. Mitral Valve: The mitral valve is grossly normal. Trivial mitral valve regurgitation. No evidence of mitral valve stenosis. Tricuspid Valve: The tricuspid valve is grossly normal. Tricuspid valve regurgitation is not demonstrated. No evidence of tricuspid stenosis. Aortic Valve: The aortic valve is grossly normal. Aortic valve regurgitation is not visualized. No aortic stenosis is present. Pulmonic Valve: The pulmonic valve was grossly normal. Pulmonic valve regurgitation is not visualized. No evidence of pulmonic stenosis. Aorta: The aortic root and ascending aorta are structurally normal, with no evidence of dilitation. Venous: The inferior vena cava is normal in size with greater than 50% respiratory variability, suggesting right atrial pressure of 3 mmHg. IAS/Shunts: The atrial septum is grossly normal. Additional Comments: A pacer wire is visualized in the right ventricle and right atrium.  LEFT VENTRICLE PLAX 2D LVIDd:         5.50 cm  Diastology LVIDs:          4.50 cm  LV e' lateral:   6.22 cm/s LV PW:         1.40 cm  LV E/e' lateral: 12.7 LV IVS:        1.20 cm  LV e' medial:    4.68 cm/s LVOT diam:     2.30 cm  LV E/e' medial:  16.9 LV SV:         70 LV SV Index:   29 LVOT Area:     4.15 cm  RIGHT VENTRICLE             IVC RV Basal diam:  2.80 cm     IVC diam: 2.00 cm RV S prime:     12.50 cm/s TAPSE (M-mode): 2.2 cm LEFT ATRIUM             Index       RIGHT ATRIUM           Index LA diam:        4.70 cm 1.97 cm/m  RA Area:     17.60 cm LA Vol (A2C):   77.5 ml 32.54 ml/m RA Volume:   52.40 ml  22.00 ml/m LA Vol (A4C):   40.8 ml 17.13 ml/m LA Biplane Vol: 56.8 ml 23.85 ml/m  AORTIC VALVE LVOT Vmax:   79.80 cm/s LVOT Vmean:  52.600 cm/s LVOT VTI:    0.169 m  AORTA Ao Root diam: 3.30 cm Ao Asc diam:  3.30 cm MITRAL VALVE MV Area (PHT): 3.03 cm     SHUNTS MV Decel Time: 250 msec  Systemic VTI:  0.17 m MV E velocity: 79.20 cm/s   Systemic Diam: 2.30 cm MV A velocity: 100.00 cm/s MV E/A ratio:  0.79 Eleonore Chiquito MD Electronically signed by Eleonore Chiquito MD Signature Date/Time: 07/03/2019/1:33:28 PM    Final    CUP PACEART REMOTE DEVICE CHECK  Result Date: 06/26/2019 Scheduled remote reviewed. Normal device function.  Next remote 91 days. Felisa Bonier, RN, MSN  CT HEAD CODE STROKE WO CONTRAST  Result Date: 07/02/2019 CLINICAL DATA:  Code stroke. 59 year old female with left mouth drooping, left upper extremity pain. EXAM: CT HEAD WITHOUT CONTRAST TECHNIQUE: Contiguous axial images were obtained from the base of the skull through the vertex without intravenous contrast. COMPARISON:  Brain MRI 11/17/2010. Head CT 07/31/2015. FINDINGS: Brain: Cerebral volume remains normal. There is a tiny chronic infarct in the left cerebellum unchanged since 20/12 (series 2, image 7). Elsewhere normal gray-white matter differentiation. No midline shift, ventriculomegaly, mass effect, evidence of mass lesion, intracranial hemorrhage or evidence of cortically based acute  infarction. Vascular: Calcified atherosclerosis at the skull base. No suspicious intracranial vascular hyperdensity. Skull: Stable, negative. Sinuses/Orbits: Visualized paranasal sinuses and mastoids are stable and well pneumatized. Other: Visualized orbits and scalp soft tissues are within normal limits. ASPECTS [facility] Stroke Program Early CT Score) Total score (0-10 with 10 being normal): 10 IMPRESSION: 1. No acute cortically based infarct or acute Intracranial hemorrhage identified. ASPECTS 10. 2. Tiny chronic infarct in the left cerebellum unchanged since 2012. 3. Study discussed by telephone with Dr. Virgel Manifold on 07/02/2019 at 12:08 . Electronically Signed   By: Genevie Ann M.D.   On: 07/02/2019 12:09   VAS US CAROTID  Result Date: 07/03/2019 Carotid Arterial Duplex Study Indications:       CVA. Risk Factors:      Hypertension, hyperlipidemia. Comparison Study:  No prior studies. Performing Technologist: Oliver Hum RVT  Examination Guidelines: A complete evaluation includes B-mode imaging, spectral Doppler, color Doppler, and power Doppler as needed of all accessible portions of each vessel. Bilateral testing is considered an integral part of a complete examination. Limited examinations for reoccurring indications may be performed as noted.  Right Carotid Findings: +----------+--------+--------+--------+-----------------------+--------+           PSV cm/sEDV cm/sStenosisPlaque Description     Comments +----------+--------+--------+--------+-----------------------+--------+ CCA Prox  105     20              smooth and heterogenous         +----------+--------+--------+--------+-----------------------+--------+ CCA Distal98      24              smooth and heterogenous         +----------+--------+--------+--------+-----------------------+--------+ ICA Prox  79      21              smooth and heterogenous          +----------+--------+--------+--------+-----------------------+--------+ ICA Distal70      20                                     tortuous +----------+--------+--------+--------+-----------------------+--------+ ECA       119     17                                              +----------+--------+--------+--------+-----------------------+--------+ +----------+--------+-------+--------+-------------------+  PSV cm/sEDV cmsDescribeArm Pressure (mmHG) +----------+--------+-------+--------+-------------------+ Subclavian209                                        +----------+--------+-------+--------+-------------------+ +---------+--------+--+--------+-+---------+ VertebralPSV cm/s45EDV cm/s9Antegrade +---------+--------+--+--------+-+---------+  Left Carotid Findings: +----------+--------+--------+--------+-----------------------+--------+           PSV cm/sEDV cm/sStenosisPlaque Description     Comments +----------+--------+--------+--------+-----------------------+--------+ CCA Prox  127     30              smooth and heterogenous         +----------+--------+--------+--------+-----------------------+--------+ CCA Distal90      23              smooth and heterogenous         +----------+--------+--------+--------+-----------------------+--------+ ICA Prox  58      20              smooth and heterogenous         +----------+--------+--------+--------+-----------------------+--------+ ICA Distal57      20                                     tortuous +----------+--------+--------+--------+-----------------------+--------+ ECA       86      19                                              +----------+--------+--------+--------+-----------------------+--------+ +----------+--------+--------+--------+-------------------+           PSV cm/sEDV cm/sDescribeArm Pressure (mmHG) +----------+--------+--------+--------+-------------------+  Subclavian150                                         +----------+--------+--------+--------+-------------------+ +---------+--------+--+--------+--+---------+ VertebralPSV cm/s41EDV cm/s13Antegrade +---------+--------+--+--------+--+---------+   Summary: Right Carotid: Velocities in the right ICA are consistent with a 1-39% stenosis. Left Carotid: Velocities in the left ICA are consistent with a 1-39% stenosis. Vertebrals: Bilateral vertebral arteries demonstrate antegrade flow. *See table(s) above for measurements and observations.  Electronically signed by Curt Jews MD on 07/03/2019 at 3:29:05 PM.    Final    VAS [place] TRANSCRANIAL DOPPLER  Result Date: 07/03/2019  Transcranial Doppler Indications: Stroke. Limitations for diagnostic windows: Unable to insonate right transtemporal window. Unable to insonate left transtemporal window. Comparison Study: No prior studies. Performing Technologist: Oliver Hum RVT  Examination Guidelines: A complete evaluation includes B-mode imaging, spectral Doppler, color Doppler, and power Doppler as needed of all accessible portions of each vessel. Bilateral testing is considered an integral part of a complete examination. Limited examinations for reoccurring indications may be performed as noted.  +----------+-------------+----------+-----------+-------+ RIGHT TCD Right VM (cm)Depth (cm)PulsatilityComment +----------+-------------+----------+-----------+-------+ Opthalmic     10.00                 1.19            +----------+-------------+----------+-----------+-------+ ICA siphon    28.00                 1.65            +----------+-------------+----------+-----------+-------+ Vertebral    -19.00                 0.95            +----------+-------------+----------+-----------+-------+  +----------+------------+----------+-----------+-------+  LEFT TCD  Left VM (cm)Depth (cm)PulsatilityComment  +----------+------------+----------+-----------+-------+ Opthalmic    16.00                 1.06            +----------+------------+----------+-----------+-------+ ICA siphon   42.00                 0.99            +----------+------------+----------+-----------+-------+ Vertebral    -20.00                1.04            +----------+------------+----------+-----------+-------+     Preliminary       HISTORY OF PRESENT ILLNESS [place] R Sparby is an 59 y.o. female with PMH seizures (last one age 37), HTN, HLD, tobacco abuse, systolic CHF, COPD, implanted defibrillator (systolic HF), bell's palsy x 3 ho presented to [facility] for left facial droop. Patient stated that yesterday afternoon she had left arm pain and chest tightness. She thought that she was tired so she showered and went to bed. When she woke up this morning about 0500 the left arm pain was still present, but the chest tightness had subsided. After she took her morning medications and ate breakfast she felt that " little men where in her mouth and pulling strings". She saw on her phone that she had left facial droop. Denies vision changes, unusual SOB, ETOH, blood thinners. Endorses smoking. At baseline she uses a cane to ambulate. She does live alone, but has a nurse that checks on her daily because of her disability from her CHF.  LKW: unclear - pt was totally symptom free at around 10 am on 4/19 but to the teleneurologist who saw at [place] was given to be 10am on 07/02/2019 CTH: no hemorrhage BP: 136/89 BG: 103 Modified Rankin: Rankin Score=1 NIHSS:2 sensation and facial droop  Seen by tele neurology at [facility], initial NIHSS 2 for sensation and facial droop. TPA given 07/02/2019 at [facility]: started at 1222 Transferred to [place]. [facility] s/p [facility] COURSE Ms. DAENA TRABOLD is a 58 y.o. female with history of seizures, HTN, HLD, tobacco abuse,  systolic CHF, COPD, defibrillator, Bell's palsy x 3 presenting to [facility] with L facial droop. Received tPA 07/02/2019 at 1222. Transferred to [facility] for post tPA care on 3W.    TIA s/p tPA for stroke symptoms  Code Stroke CT head No acute abnormality. Tiny old L cerebellar infarct since 2012.   CT head at 24h neg for acute stroke. Old L cerebellar infarct    TCD lack of temporal windows  Carotid Doppler  B ICA 1-39% stenosis, VAs antegrade   2D Echo EF 45-50%. No source of embolus   LDL 77  HgbA1c 5.7  ICD interrogation negative for RVR (single chamber ICD, not able to assess AF but no RVR and ventricular rate were stable)   SCDs for VTE prophylaxis  aspirin 81 mg daily prior to admission, now on DAPT. Continue x 3 weeks then ASA alone   Therapy recommendations:  no therapy needs  Disposition:  return home (Disabled d/t CHF w/ home RN checks, uses cane at baseline)  Hx stroke/TIA ? 07/2015 R occipital HA w flexor spasm of R hand. Inconclusive dx ? 04/2013 dysarthria, R facial droop, HA. Functional  ? 11/24/2012 HA and L sided weakness. analgesics  for HA ? 11/15/2012 HA, tx as migraine. L sided weakness ? 07/2012 - R brain TIA not taking recently prescribed asa. Placed back on along w/ statin. ? 06/2012 - R brain TIA w/ multiple risk factors. Put on asa 325. Hypercoagulable workup  Chronic systolic Congestive heart failure s/p cardioverter-defibrillator (St Jude 2013)  loop placed 2017   Hypertension  Home meds:  Coreg 12.5 bid, lasix 40 bid, enestro bid - resumed in hospital  Stable  Long-term BP goal normotensive  Hyperlipidemia  Home meds:  crestor 20, resumed in hospital  Lipitor allergy (Itching and rash)  LDL 77, goal < 70  Continue statin at discharge  Tobacco abuse  Current smoker  Smoking cessation counseling provided  Pt is willing to quit  Other Stroke Risk Factors  Morbid Obesity, Body mass index is 44.77  kg/m., recommend weight loss, diet and exercise as appropriate   Other Active Problems  Hx Bells Palsy x 3  Hx seizures, last one at age 70  Hypothyroid on synthroid  Insomnia on gabapentin  COPD on albuterol prn   GERD on PPI   DISCHARGE EXAM  Temp:  [98 F (36.7 C)-98.9 F (37.2 C)] 98.6 F (37 C) (04/21 1646) Pulse Rate:  [67-93] 86 (04/21 1646) Resp:  [10-25] 17 (04/21 1646) BP: (107-144)/(56-81) 117/61 (04/21 1646) SpO2:  [92 %-99 %] 97 % (04/21 1646)  General - Well nourished, well developed, in no apparent distress.  Ophthalmologic - fundi not visualized due to noncooperation.  Cardiovascular - Regular rhythm and rate.  Mental Status -  Level of arousal and orientation to time, place, and person were intact. Language including expression, naming, repetition, comprehension was assessed and found intact. Fund of Knowledge was assessed and was intact.  Cranial Nerves II - XII - II - Visual field intact OU. III, IV, VI - Extraocular movements intact. V - Facial sensation intact bilaterally. VII - Facial movement intact bilaterally. VIII - Hearing & vestibular intact bilaterally. X - Palate elevates symmetrically. XI - Chin turning & shoulder shrug intact bilaterally. XII - Tongue protrusion intact.  Motor Strength - The patient's strength was normal in all extremities and pronator drift was absent.  Bulk was normal and fasciculations were absent.   Motor Tone - Muscle tone was assessed at the neck and appendages and was normal.  Reflexes - The patient's reflexes were symmetrical in all extremities and she had no pathological reflexes.  Sensory - Light touch, temperature/pinprick were assessed and were symmetrical.    Coordination - The patient had normal movements in the hands and feet with no ataxia or dysmetria.  Tremor was absent  Gait and Station - deferred.   Discharge Diet   Heart healthy/carb modified thin liquids  DISCHARGE  PLAN  Disposition:  Return home  aspirin 81 mg daily and clopidogrel 75 mg daily for secondary stroke prevention for 3 weeks then plavix alone.  Ongoing stroke risk factor control by Primary Care Physician at time of discharge  Follow-up PCP Rosita Fire, MD in 2 weeks.  Follow-up in [facility] in 4 weeks, office to schedule an appointment.   35 minutes were spent preparing discharge.  Rosalin Hawking, MD PhD Stroke Neurology 07/03/2019 6:58 PM

## 2019-07-03 NOTE — Evaluation (Addendum)
Physical Therapy Evaluation Patient Details Name: Melanie Cordova MRN: [id number] DOB: 08/25/1960 Today's Date: 07/03/2019   History of Present Illness  Pt is a 59 yo female presenting 4/20 for L-sided facial droop, L arm pain, and chest tightness. Pt is now s/p tPA (1222 4/20), with imaging showing no acute abnormalities. PMH includes: seizures (last one age 69), HTN, HLD, tobacco abuse, systolic CHF, COPD, implanted defibrillator (systolic HF), bell's palsy x 3.  Clinical Impression  Pt in bed upon arrival of PT, agreeable to evaluation at this time. Prior to admission the pt was independent with use of cane for mobility, but relied on nurse that comes 4-5 hours 4 days/week for some ADLs and iADLs. The pt now presents with minor limitations in endurance and stability due to above dx, but is safe to return home with supervision/assist from friends and care assistant as needed. The pt was able to demo good independence with bed mobility and transfers as well as good ability to complete hallway ambulation with cane and no LOB. No acute PT needs identified, pt will be able to mobilize safely with RN staff and return home at her baseline level of function.      Follow Up Recommendations No PT follow up    Equipment Recommendations  None recommended by PT    Recommendations for Other Services       Precautions / Restrictions Precautions Precautions: Fall Restrictions Weight Bearing Restrictions: No      Mobility  Bed Mobility Overal bed mobility: Modified Independent             General bed mobility comments: pt with some use of bed rails, able to pull to sit without assist  Transfers Overall transfer level: Needs assistance Equipment used: Straight cane Transfers: Sit to/from Stand Sit to Stand: Supervision         General transfer comment: no assist to rise, no LOB. pt performed without supervision in bathroom, supervision for safety. 5xSTS without AD in 51  sec  Ambulation/Gait Ambulation/Gait assistance: Supervision Gait Distance (Feet): 50 Feet Assistive device: Straight cane Gait Pattern/deviations: WFL(Within Functional Limits);Step-through pattern;Decreased stride length Gait velocity: 0.3 m/s Gait velocity interpretation: 1.31 - 2.62 ft/sec, indicative of limited community ambulator General Gait Details: pt ambulates slowly but with good stability,some lateral movement but reports ambulation is at her baseline  Science writer    Modified Rankin (Stroke Patients Only) Modified Rankin (Stroke Patients Only) Pre-Morbid Rankin Score: Moderate disability Modified Rankin: Moderately severe disability     Balance Overall balance assessment: Needs assistance Sitting-balance support: No upper extremity supported;Feet supported Sitting balance-Leahy Scale: Fair       Standing balance-Leahy Scale: Fair Standing balance comment: single UE support             High level balance activites: Backward walking;Direction changes High Level Balance Comments: pt able to complete with supervision,no LOB             Pertinent Vitals/Pain Pain Assessment: Faces Faces Pain Scale: Hurts a little bit Pain Location: stiffness fromlaying in bed Pain Intervention(s): Limited activity within patient's tolerance;Monitored during session;Repositioned    Home Living Family/patient expects to be discharged to:: Private residence Living Arrangements: Alone Available Help at Discharge: Friend(s);Family;Personal care attendant;Available PRN/intermittently(church family that can checkon her, brother comes as needed) Type of Home: Apartment Home Access: Other (comment);Stairs to enter(pt is in 1st floor apt, but has to navigate hill or stairsto  get to apt. useshill more than stairs\) Entrance Stairs-Rails: Right;Left Entrance Stairs-Number of Steps: 8 Home Layout: One level Home Equipment: Tub bench;Cane - single  point Additional Comments: uses chair in kitchen to sit while doing tasks there    Prior Function Level of Independence: Independent with assistive device(s);Needs assistance   Gait / Transfers Assistance Needed: [facility]  ADL's / Homemaking Assistance Needed: nurse comes 4-5 hours/ 4x/week to help with cooking,house cleaning, errands, laundry, and self-care.  Comments: used SPC at home, ondisability, has a Marine scientist that comes in 4-5 hrs 4 days/week, calls every day     Hand Dominance   Dominant Hand: Left    Extremity/Trunk Assessment   Upper Extremity Assessment Upper Extremity Assessment: Overall WFL for tasks assessed    Lower Extremity Assessment Lower Extremity Assessment: Overall WFL for tasks assessed    Cervical / Trunk Assessment Cervical / Trunk Assessment: Kyphotic  Communication   Communication: No difficulties  Cognition Arousal/Alertness: Awake/alert Behavior During Therapy: WFL for tasks assessed/performed Overall Cognitive Status: Within Functional Limits for tasks assessed                                        General Comments      Exercises     Assessment/Plan    PT Assessment Patent does not need any further PT services  PT Problem List Decreased strength;Decreased balance;Decreased mobility;Obesity;Decreased activity tolerance;Decreased coordination       PT Treatment Interventions DME instruction;Gait training;Stair training;Therapeutic exercise;Therapeutic activities;Functional mobility training;Balance training;Patient/family education;Neuromuscular re-education    PT Goals (Current goals can be found in the Care Plan section)  Acute Rehab PT Goals Patient Stated Goal: return home PT Goal Formulation: With patient Time For Goal Achievement: 07/17/19 Potential to Achieve Goals: Good    Frequency     Barriers to discharge        Co-evaluation               AM-PAC PT "6 Clicks" Mobility  Outcome Measure Help  needed turning from your back to your side while in a flat bed without using bedrails?: None Help needed moving from lying on your back to sitting on the side of a flat bed without using bedrails?: None Help needed moving to and from a bed to a chair (including a wheelchair)?: A Little Help needed standing up from a chair using your arms (e.g., wheelchair or bedside chair)?: None Help needed to walk in hospital room?: A Little Help needed climbing 3-5 steps with a railing? : A Little 6 Click Score: 21    End of Session Equipment Utilized During Treatment: Gait belt Activity Tolerance: Patient tolerated treatment well Patient left: in bed;with bed alarm set;with SCD's reapplied Nurse Communication: Mobility status PT Visit Diagnosis: Other abnormalities of gait and mobility (R26.89)    Time: 1010-1048 PT Time Calculation (min) (ACUTE ONLY): 38 min   Charges:   PT Evaluation $PT Eval Moderate Complexity: 1 Mod PT Treatments $Gait Training: 8-22 mins $Therapeutic Activity: 8-22 mins        Karma Ganja, PT, DPT   Acute Rehabilitation Department Pager #: [phone]  Otho Bellows 07/03/2019, 11:08 AM

## 2019-07-03 NOTE — Progress Notes (Signed)
  Echocardiogram 2D Echocardiogram has been performed.  Mehar Sagen G Drystan Reader 07/03/2019, 11:45 AM

## 2019-07-03 NOTE — Progress Notes (Signed)
Carotid artery duplex and transcranial doppler has been completed. Preliminary results can be found in CV Proc through chart review.   07/03/19 3:03 PM Melanie Cordova RVT

## 2019-07-03 NOTE — Social Work (Signed)
CSW was unable to complete sbirt due to the pt. Being occupied by nurses. CSW will attempt at more appropriate time.   Emeterio Reeve, Latanya Presser, [place] Social Worker [phone]

## 2019-07-09 ENCOUNTER — Other Ambulatory Visit: Payer: Self-pay | Admitting: *Deleted

## 2019-07-09 ENCOUNTER — Encounter: Payer: Self-pay | Admitting: *Deleted

## 2019-07-09 NOTE — Patient Outreach (Addendum)
[facility]) Care [facility]  07/09/2019  Melanie Cordova Feb 07, 1961 [id number]   Melanie Cordova Discharged from Melanie Cordova , not on APL  RED ON [name] ALERT Day # 1  Date: Saturday 07/06/19 1301  Red Alert Reason: Scheduled a follow-up appointment? I Don't Know Problems setting up rehab? Yes   Insurance:  [employer] care Melanie Cordova)  Melanie Cordova admissions x 1 ED visits x 1 in the last 6 months  Lat admission 07/02/19 to 07/03/19 seen by Melanie Cordova neurology for TIA  Outreach attempt # 1 successful to the number  Patient is able to verify HIPAA, DOB and address Melanie Management RN reviewed and addressed red alert with patient   [name]:  Melanie Cordova has made an appointment to see Melanie Cordova and her after summary visit sheet states then neurology office will call her She has not had a call to schedule rehab services or neurology follow up  She gives [facility] RN CM permission to contact the neurology office to check on the progress of the date for follow  Social: [name] is a 59 year old divorced female who lives alone but has a nurse/CNA check on her daily because she is disabled from her CHF. She has 2 children and denies issues with transportation to medical appointments nor being able to complete her  Activities of daily living (ADLs) and  IADLs (instrumental Activities of Daily Living) She also has a brother and church members who check on her    Conditions:  transient ischemic attack (TIA status post (s/p) TPA), Hypertension (HTN), cardiomyopathy, hypothyroidism, tobacco abuse, Hyperlipidemia (HLD), chronic systolic heart failure, morbid obesity due to excess calories, seizures (last at age 61) implanted defibrillator, allergies, GERD (gastroesophageal reflux disease) bell's pals, smoker ,  DME: cane, tub bench   Medications: denies concerns with taking medications as prescribed, affording medications, side effects of medications and questions about  medications  Appointments: 07/10/19 10 Melanie Cordova primary care provider (PCP)   Advance Directives: Denies need for assist with advance directives    Consent: [facility] RN CM reviewed Melanie Cordova services with patient. Patient gave verbal consent for services [facility] telephonic RN CM.   Advised patient that there will be further automated [name]- post discharge calls to assess how the patient is doing following the recent hospitalization Advised the patient that another call may be received from a nurse if any of their responses were abnormal. Patient voiced understanding and was appreciative of f/u call.   Plan: [facility] RN CM will follow up with Melanie Cordova within the next 7-10 business days to follow up the neurology appointment   Pt encouraged to return a call to [facility] RN CM prn  [facility] RN CM sent a successful outreach letter as discussed with Melanie Cordova brochure enclosed for review   Routed note to MDs/NP/PA   Melanie Cordova. Melanie Hamman, RN, BSN, [place] Coordinator Office number [phone] Mobile number [phone]  Main [facility] number [phone] Fax number [phone]

## 2019-07-10 ENCOUNTER — Ambulatory Visit (HOSPITAL_COMMUNITY): Payer: Medicare Other | Attending: Internal Medicine

## 2019-07-10 ENCOUNTER — Emergency Department (HOSPITAL_COMMUNITY): Payer: Medicare Other

## 2019-07-10 ENCOUNTER — Encounter (HOSPITAL_COMMUNITY): Payer: Self-pay | Admitting: Emergency Medicine

## 2019-07-10 ENCOUNTER — Other Ambulatory Visit: Payer: Self-pay

## 2019-07-10 ENCOUNTER — Emergency Department (HOSPITAL_COMMUNITY)
Admission: EM | Admit: 2019-07-10 | Discharge: 2019-07-11 | Disposition: A | Discharge status: Home / Self Care | Payer: Medicare Other | Source: Home / Self Care

## 2019-07-10 ENCOUNTER — Other Ambulatory Visit: Payer: Self-pay | Admitting: *Deleted

## 2019-07-10 DIAGNOSIS — M79671 Pain in right foot: Secondary | ICD-10-CM | POA: Insufficient documentation

## 2019-07-10 NOTE — ED Triage Notes (Signed)
Pt reports hitting right foot on her nightstand 2 nights ago.

## 2019-07-10 NOTE — Patient Outreach (Signed)
[facility]) Care Management  07/10/2019  RUBI OOTEN [birth year] [id number]   [place] coordination  [facility] RN CM called to [facility] neurology [phone] and spoke with Lexine Baton about calling Mrs Eure to assist with a follow up appointment as [name] had recommended upon her discharge  Lexine Baton to call Mrs Gowen to arrange a neurology follow up appointment   Plan [facility] RN CM to follow up with Mrs Taubman within the next 7-10 business days   Vian L. Lavina Hamman, RN, BSN, [place] Coordinator Office number [phone] Mobile number [phone]  Main [facility] number [phone] Fax number [phone]

## 2019-07-16 ENCOUNTER — Other Ambulatory Visit: Payer: Self-pay | Admitting: *Deleted

## 2019-07-16 ENCOUNTER — Other Ambulatory Visit: Payer: Self-pay

## 2019-07-16 NOTE — Patient Outreach (Signed)
[facility]) Care Management  07/16/2019  Melanie Cordova 07/04/1960 [id number]   [name]-stroke Discharged from [facility] , not on APL  RED ON [name] ALERT Day # 1  Date: Saturday 07/06/19 1301  Red Alert Reason: Scheduled a follow-up appointment? I Don't Know Problems setting up rehab? Yes   Insurance:  [facility])  [facility] admissions x 1 ED visits x 1 in the last 6 months  Lat admission 07/02/19 to 07/03/19 seen by [name] neurology for TIA  Follow up Outreach successful to the number  Patient is able to verify HIPAA, DOB and address [place] Management RN reviewed and addressed red alertwith patient   [name]:  [name] confirms she did see [name] and has spoken to neurology office to schedule her appointment   Smoking - She confirms she still smokes but has cut back to a pack and a half a day She is denying HTN SW services for assistance at this time but agrees to [facility] materials and will call if further assistance is needed Reviewed her 07/10/19 ED visit with her and she confirms she hit the night stand and injured her toes on her right foot. [facility] RN CM discussed the fractured right small toe per scanned imaging She reports she is keeping it elevated, resting and keeping her moccasin on it She is not wearing a boot or brace and reports she is able to walk on it. She reports she still has bruising and slight swelling. She and [facility] RN CM reviewed bone density, calcium intake She reports taking a multivitamin, vitamin D and drinks "lots of milk" (when calcium discussed)  She also discussed having pressure in the back of her head with some dizziness She confirms checking her BP and it is within normal limits She does have history of allergies and migraines. She states the symptoms do not feel like her typical migraine but confirms she has been outside of her apartment near a tree that she is aware increases her allergy symptoms She also has a purifier in her  home she is using   Denies covid Continues to wear her mask She reports she has had both covid vaccine shots   Discuss metabolism, hypothyroidism and metabolic syndrome when she asked questions about not being able to lose weight the way she wants to. Referred her to her medical providers and [name], dietitian she reports she has seen to work on an Sports administrator   [facility] RN CM interventions Sent via mail [name] articles on Quitting Smoking, weight loss diet, bone density testing , metabolic syndrome, hypothyroidism, calcium and vitamin D for bone health, seasonal allergies in adults and also via her daughter's e-mail address videos on smoke cessation, metabolic syndrome, bone health  She will ask her daughter to share the videos with her   Social: Melanie Cordova is a 59 year old divorced female who lives alone but has a nurse/CNA check on her daily because she is disabled from her CHF. She has 2 children and denies issues with transportation to medical appointments nor being able to complete her  Activities of daily living (ADLs) and  IADLs (instrumental Activities of Daily Living) She also has a brother and church members who check on her She gives permission for information to be sent to her daughter, Museum/gallery conservator Merchant navy officer) via [place] listed e-mail address in EPIC     Conditions:  transient ischemic attack (TIA status post (s/p) TPA), Hypertension (HTN), cardiomyopathy, hypothyroidism, tobacco  abuse, Hyperlipidemia (HLD), chronic systolic heart failure, morbid obesity due to excess calories, seizures (last at age 11) implanted defibrillator, allergies, GERD (gastroesophageal reflux disease) bell's pals, smoker ,  DME: cane, tub bench, blood pressure (BP)   Medications: denies concerns with taking medications as prescribed, affording medications, side effects of medications and questions about medications  Appointments: September 02 2019 Neurology follow up Dr Jannifer Franklin  June 2021 [name] primary  care provider (PCP) July and October 2021 pacemaker checks    Advance Directives: She repots her daughter is her POA Denies need for assist with further advance directives    Consent: [facility] RN CM reviewed [facility] services with patient. Patient gave verbal consent for services [facility] telephonic RN CM.   Advised patient that there will be further automated [name]-post discharge calls to assess how the patient is doing following the recent hospitalization Advised the patient that another call may be received from a nurse if any of their responses were abnormal. Patient voiced understanding and was appreciative of f/u call.   Plan: [facility] RN CM close this case at this time as needs have been addressed for [name] and patient in not on the APL  Pt encouraged to return a call to [facility] RN CM prn  [facility] RN CM reviewed the successful outreach letter with [facility] brochure sent to her   Routed note to MDs/NP/PA   [place]. Lavina Hamman, RN, BSN, [place] Coordinator Office number [phone] Mobile number [phone]  Main [facility] number [phone] Fax number [phone]

## 2019-08-13 ENCOUNTER — Telehealth: Payer: Self-pay

## 2019-08-13 NOTE — Telephone Encounter (Signed)
Merlin alert received for 08/08/19 for noise reversion on single chamber ICD - RV lead. VP <1%. Patient inform and requesting patient come in for evaluation for testing.  Patient agreeable. Patient unable to come in until 3 days notice for her transportation. Apt. Made for 08/20/19 at 8:30.

## 2019-08-20 ENCOUNTER — Ambulatory Visit (INDEPENDENT_AMBULATORY_CARE_PROVIDER_SITE_OTHER): Payer: Medicare Other | Admitting: Emergency Medicine

## 2019-08-20 ENCOUNTER — Other Ambulatory Visit: Payer: Self-pay

## 2019-08-20 DIAGNOSIS — Z95810 Presence of automatic (implantable) cardiac defibrillator: Secondary | ICD-10-CM

## 2019-08-20 DIAGNOSIS — I255 Ischemic cardiomyopathy: Secondary | ICD-10-CM | POA: Diagnosis not present

## 2019-08-20 NOTE — Progress Notes (Signed)
ICD check in clinic. Normal device function. Thresholds and sensing consistent with previous device measurements. Impedance trends stable over time. No mode switches. No ventricular arrhythmias. 1 ventricular Noise reversion episode on 08/08/19 @ 1314, isometrics performed with no evidence of RV lead noise, noise episode likely r/t external source of unknown origin.  Will continue to monitor through remote transmissions. Patient educated on sources of EMI.Histogram distribution appropriate for patient and level of activity. No changes made this session. Device programmed at appropriate safety margins. Device programmed to optimize intrinsic conduction. Estimated longevity 1 yr 7 months. Pt enrolled in remote follow-up. Patient education completed including shock plan.

## 2019-09-02 ENCOUNTER — Encounter: Payer: Self-pay | Admitting: Neurology

## 2019-09-02 ENCOUNTER — Other Ambulatory Visit: Payer: Self-pay

## 2019-09-02 ENCOUNTER — Ambulatory Visit (INDEPENDENT_AMBULATORY_CARE_PROVIDER_SITE_OTHER): Payer: Medicare Other | Admitting: Neurology

## 2019-09-02 VITALS — BP 116/68 | HR 80 | Ht 67.0 in | Wt 293.5 lb

## 2019-09-02 DIAGNOSIS — G459 Transient cerebral ischemic attack, unspecified: Secondary | ICD-10-CM

## 2019-09-02 DIAGNOSIS — G43809 Other migraine, not intractable, without status migrainosus: Secondary | ICD-10-CM

## 2019-09-02 MED ORDER — TOPIRAMATE 25 MG PO TABS: ORAL_TABLET | ORAL | 3 refills | Status: DC

## 2019-09-02 NOTE — Patient Instructions (Signed)
We will start Topamax for headache prevention.  Topamax (topiramate) is a seizure medication that has an FDA approval for seizures and for migraine headache. Potential side effects of this medication include weight loss, cognitive slowing, tingling in the fingers and toes, and carbonated drinks will taste bad. If any significant side effects are noted on this drug, please contact our office.

## 2019-09-02 NOTE — Progress Notes (Signed)
Reason for visit: TIA  Referring physician: [facility] Melanie Cordova is a 59 y.o. female  History of present illness:  Melanie Cordova is a 60 year old left-handed white female with a history of obesity, cardiomyopathy with congestive heart failure, tobacco abuse, dyslipidemia, and a history of hypertension.  The patient has a defibrillator in place, she cannot have MRI evaluation of the brain.  The patient was admitted to the hospital on 02 July 2019.  She presented with onset of some chest discomfort and left arm pain the day prior, she was noted to have some left facial droop and slurred speech in the emergency room and was given TPA.  She was transferred to [facility] for further evaluation.  The patient claims that she also had numbness on the left face, arm, and leg with weakness on that side as well.  She indicates a left occipital headache associated with photophobia without phonophobia and some nausea without vomiting.  The patient was on aspirin prior to the hospitalization.  She was converted to aspirin and Plavix for 3 weeks and then converted back to aspirin.  CT scan evaluation of the brain on 2 occasions did not show any acute stroke event.  The patient claims that she had focal symptoms for greater than 1 day.  By history, the patient has had severe migraine headache in the past.  She claims that when she was a child she would have very severe headaches that would also be associated with left-sided numbness and weakness.  She denies any family history of headache.  With the above TIA event, she noted some blurring of vision without loss of vision.  She denied any problems with swallowing.  The patient has been using a cane for ambulation for about 10 years.  She denies any falls or blackouts.  She underwent a carotid Doppler study that was unremarkable, and a 2D echocardiogram showed an ejection fraction of 45 to 50%.  Her hemoglobin A1c was 5.7, her LDL cholesterol was 77.   The patient does note urinary frequency and some incontinence, she has a lot of gas in her stomach.  She comes to the office today for further evaluation.  Past Medical History:  Diagnosis Date  . Anxiety   . Arthritis   . Asthma   . Automatic implantable cardioverter-defibrillator in situ    2013, july  . Bell palsy    states has had 3 episodes  . Cardiomyopathy 08/2010   Presented with congestive heart failure; EF of 15% and 2012; hypotension on medication precludes optimal dosing  . CHF (congestive heart failure) ([place])    a. EF 20% in 2012 with low-risk NST --> St. Jude ICD implantation by Dr. Lovena Le in 09/2011  . Chronic systolic heart failure ([place])   . COPD (chronic obstructive pulmonary disease) ([place])    2013  . Dysrhythmia   . Gastroesophageal reflux disease   . Headache(784.0)   . Hot flashes 10/04/2016  . Hyperlipidemia   . Hypertension    09/2010-normal CMet and CBC; Lipid profile-116, 88, 25, 73  . Hypothyroidism    Recent TSH was normal.  . 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 Neuromuscular disorder (HCC)    Neuropathy, right foot and leg  . Neuropathy   . Obesity   . Pneumonia   . Seizures ([place])    last one at age 74  . Shortness of breath   . Stroke [facility])     stroke in  07/2012 and another in June, 2014  . Tobacco abuse    20 pack years    Past Surgical History:  Procedure Laterality Date  . CESAREAN SECTION     X2  . COLONOSCOPY N/A 12/16/2013   One simple adenoma and 3 hyperplastic polyps. Small internal hemorrhoids. Left colon redundant. Due for surveillance 2020.   Marland Kitchen COLONOSCOPY WITH PROPOFOL N/A 06/04/2019   Procedure: COLONOSCOPY WITH PROPOFOL;  Surgeon: Danie Binder, MD;  [facility];  Service: Endoscopy;  Laterality: N/A;  10:30am  . EP IMPLANTABLE DEVICE     St. Jude  . EP IMPLANTABLE DEVICE N/A 08/13/2015   Procedure: Loop Recorder Insertion;  Surgeon: Evans Lance, MD;  Location: [place] CV LAB;   Service: Cardiovascular;  Laterality: N/A;  . IMPLANTABLE CARDIOVERTER DEFIBRILLATOR IMPLANT N/A 10/10/2011   Procedure: IMPLANTABLE CARDIOVERTER DEFIBRILLATOR IMPLANT;  Surgeon: Evans Lance, MD;  Location: [facility] CATH LAB;  Service: Cardiovascular;  Laterality: N/A;  . MULTIPLE EXTRACTIONS WITH ALVEOLOPLASTY N/A 09/24/2012   Procedure: MULTIPLE EXTRACION #2, 4, 6, 7 ,8, 9, 11, 13, 18, 20, 21, 22, 23, 24, 25, 26, 27, 29 WITH ALVEOLOPLASTY, BIOPSY OF PALATE LESION, REMOVA RIGHT LINGUAL TORUS;  Surgeon: Gae Bon, DDS;  Location: [place];  Service: Oral Surgery;  Laterality: N/A;  . POLYPECTOMY  06/04/2019   Procedure: POLYPECTOMY;  Surgeon: Danie Binder, MD;  Location: [facility];  Service: Endoscopy;;  . TEE WITHOUT CARDIOVERSION N/A 07/11/2012   Procedure: TRANSESOPHAGEAL ECHOCARDIOGRAM (TEE);  Surgeon: Thayer Headings, MD;  Location: [facility] ENDOSCOPY;  Service: Cardiovascular;  Laterality: N/A;  . TUBAL LIGATION      Family History  Problem Relation Age of Onset  . Cardiomyopathy Mother        ICD pacemaker-ischmic CM  . Heart failure Mother   . Hypertension Mother   . Diabetes Mother   . Thyroid disease Mother   . Cardiomyopathy Father        Deceased  . Coronary artery disease Father   . Heart failure Father   . Heart failure Brother   . Hypertension Brother   . Endometriosis Sister   . Heart disease Paternal Grandfather   . Heart disease Paternal Grandmother   . Heart disease Maternal Grandmother   . Heart disease Maternal Grandfather   . Hypertension Daughter   . Obesity Daughter   . Colon cancer Neg Hx   . Liver disease Neg Hx     Social history:  reports that she has been smoking cigarettes. She started smoking about 36 years ago. She has a 10.50 pack-year smoking history. She has never used smokeless tobacco. She reports that she does not drink alcohol and does not use drugs.  Medications:  Prior to Admission medications   Medication Sig Start Date End Date Taking?  Authorizing Provider  albuterol (PROVENTIL HFA;VENTOLIN HFA) 108 (90 BASE) MCG/ACT inhaler Inhale 2 puffs into the lungs every 6 (six) hours as needed for wheezing or shortness of breath. 07/12/12  Yes Ziemer, Kelli Hope, MD  aspirin EC 81 MG tablet Take 1 tablet (81 mg total) by mouth daily. 07/03/19  Yes Donzetta Starch, NP  Biotin 10000 MCG TABS Take 10,000 mcg by mouth daily.    Yes [provider]  budesonide-formoterol (SYMBICORT) 80-4.5 MCG/ACT inhaler Inhale 2 puffs into the lungs 2 (two) times daily.   Yes [provider]  carvedilol (COREG) 12.5 MG tablet Take 25 mg by mouth 2 (two) times daily  with a meal.   Yes [provider]  cetirizine (ZYRTEC) 10 MG tablet Take 10 mg by mouth daily.   Yes [provider]  Cholecalciferol (VITAMIN D3) 5000 units CAPS TAKE ONE CAPSULE BY MOUTH DAILY 09/22/17  Yes Nida, Marella Chimes, MD  clobetasol (TEMOVATE) 0.05 % external solution Apply 1 application topically 2 (two) times daily as needed (Apply to scalp.).  07/24/15  Yes [provider]  clopidogrel (PLAVIX) 75 MG tablet Take 1 tablet (75 mg total) by mouth daily. 07/03/19  Yes Rosalin Hawking, MD  diclofenac sodium (VOLTAREN) 1 % GEL Apply 2 g topically 4 (four) times daily as needed (pain).    Yes [provider]  diphenhydrAMINE (BENADRYL) 25 MG tablet Take 25 mg by mouth in the morning and at bedtime.    Yes [provider]  ENTRESTO 24-26 MG TAKE ONE TABLET BY MOUTH 2 TIMES A DAY Patient taking differently: Take 1 tablet by mouth 2 (two) times daily.  12/31/18  Yes Evans Lance, MD  famotidine (PEPCID) 20 MG tablet Take 20 mg by mouth daily as needed for heartburn or indigestion.   Yes [provider]  Ferrous Sulfate (IRON) 28 MG TABS Take 28 mg by mouth daily.    Yes [provider]  fluticasone (FLONASE) 50 MCG/ACT nasal spray Place 2 sprays into both nostrils daily.    Yes [provider]  furosemide  (LASIX) 40 MG tablet Take 1 tablet (40 mg total) by mouth 2 (two) times daily. 08/29/12  Yes Rothbart, Cristopher Estimable, MD  gabapentin (NEURONTIN) 800 MG tablet Take 800 mg by mouth every 6 (six) hours as needed (pain).  10/30/15  Yes [provider]  ketoconazole (NIZORAL) 2 % cream Apply 1 application topically 2 (two) times daily.  10/21/15  Yes [provider]  levothyroxine (SYNTHROID, LEVOTHROID) 200 MCG tablet Take 1 tablet (200 mcg total) by mouth daily before breakfast. 03/01/18  Yes Nida, Marella Chimes, MD  Multiple Vitamins-Minerals (CENTRAVITES 50 PLUS PO) Take 1 tablet by mouth daily.    Yes [provider]  pantoprazole (PROTONIX) 20 MG tablet Take 1 tablet (20 mg total) by mouth daily. 06/21/18  Yes Strader, [place] M, PA-C  potassium chloride SA (K-DUR) 20 MEQ tablet Take 20 mEq by mouth daily.  05/23/18  Yes [provider]  psyllium (METAMUCIL) 58.6 % packet Take 1 packet by mouth daily.   Yes [provider]  rosuvastatin (CRESTOR) 20 MG tablet Take 20 mg by mouth daily.   Yes [provider]  Simethicone (GAS-X PO) Take 1 tablet by mouth in the morning and at bedtime.    Yes [provider]  Vitamin D, Ergocalciferol, (DRISDOL) 1.25 MG (50000 UNIT) CAPS capsule Take 50,000 Units by mouth every 30 (thirty) days.   Yes [provider]      Allergies  Allergen Reactions  . Fish Allergy Anaphylaxis  . Iodinated Diagnostic Agents Hives and Other (See Comments)    Pulmonary problems; no frank respiratory arrest  . Iodine Hives and Other (See Comments)    Pulmonary Problems   . Lentil Anaphylaxis  . Penicillins Anaphylaxis and Shortness Of Breath    Has patient had a PCN reaction causing immediate rash, facial/tongue/throat swelling, SOB or lightheadedness with hypotension: Yes Has patient had a PCN reaction causing severe rash involving mucus membranes or skin necrosis: No Has patient had a PCN reaction that required  hospitalization No Has patient had a PCN reaction occurring within  the last 10 years: No If all of the above answers are "NO", then may proceed with Cephalosporin use.  Hair loss  . Lipitor [Atorvastatin] Itching and Rash  . Spironolactone Rash  . Sulfa Antibiotics Other (See Comments)    Unknown    ROS:  Out of a complete 14 system review of symptoms, the patient complains only of the following symptoms, and all other reviewed systems are negative.  Headache Left-sided numbness and weakness Walking difficulty  Blood pressure 116/68, pulse 80, height 5\' 7"  (1.702 m), weight 293 lb 8 oz (133.1 kg).  Physical Exam  General: The patient is alert and cooperative at the time of the examination.  The patient is markedly obese.  Eyes: Pupils are equal, round, and reactive to light. Discs are flat bilaterally.  Neck: The neck is supple, no carotid bruits are noted.  Respiratory: The respiratory examination is clear.  Cardiovascular: The cardiovascular examination reveals a regular rate and rhythm, no obvious murmurs or rubs are noted.  Skin: Extremities are with 2-3+ edema below the knees bilaterally.  Neurologic Exam  Mental status: The patient is alert and oriented x 3 at the time of the examination. The patient has apparent normal recent and remote memory, with an apparently normal attention span and concentration ability.  Cranial nerves: Facial symmetry is present. There is good sensation of the face to pinprick and soft touch bilaterally. The strength of the facial muscles and the muscles to head turning and shoulder shrug are normal bilaterally. Speech is well enunciated, no aphasia or dysarthria is noted. Extraocular movements are full. Visual fields are full. The tongue is midline, and the patient has symmetric elevation of the soft palate. No obvious hearing deficits are noted.  Motor: The motor testing reveals 5 over 5 strength of all 4 extremities. Good symmetric motor  tone is noted throughout.  Sensory: Sensory testing is intact to pinprick, soft touch, vibration sensation, and position sense on all 4 extremities, with the exception of some decreased pinprick sensation and vibration sensation and position sensation of the right foot and right lower leg. No evidence of extinction is noted.  Coordination: Cerebellar testing reveals good finger-nose-finger and heel-to-shin bilaterally.  Gait and station: Gait is slightly wide-based, the patient only uses a cane for ambulation.  Tandem gait was not attempted.  Romberg is negative but is slightly unsteady.  Reflexes: Deep tendon reflexes are symmetric, but are depressed bilaterally. Toes are downgoing bilaterally.   CT head 07/03/19:  IMPRESSION: 1. No evidence of acute intracranial abnormality. 2. Chronic cerebellar infarct.  * CT scan images were reviewed online. I agree with the written report.   2D echo 07/03/19:  IMPRESSIONS    1. Left ventricular ejection fraction, by estimation, is 45 to 50%. The  left ventricle has mildly decreased function. Left ventricular endocardial  border not optimally defined to evaluate regional wall motion. There is  mild concentric left ventricular  hypertrophy. Left ventricular diastolic parameters are consistent with  Grade I diastolic dysfunction (impaired relaxation).  2. Right ventricular systolic function is normal. The right ventricular  size is mildly enlarged. Tricuspid regurgitation signal is inadequate for  assessing PA pressure.  3. The mitral valve is grossly normal. Trivial mitral valve  regurgitation. No evidence of mitral stenosis.  4. The aortic valve is grossly normal. Aortic valve regurgitation is not  visualized. No aortic stenosis is present.  5. The inferior vena cava is normal in size with greater than 50%  respiratory variability, suggesting  right atrial pressure of 3 mmHg.    Carotid doppler 07/03/19:  Summary:  Right Carotid:  Velocities in the right ICA are consistent with a 1-39%  stenosis.   Left Carotid: Velocities in the left ICA are consistent with a 1-39%  stenosis.   Vertebrals: Bilateral vertebral arteries demonstrate antegrade flow.     Assessment/Plan:  1.  TIA event  2.  History of migraine with hemiplegic features  3.  Ongoing tobacco abuse  The patient has cut back on her smoking from 1 pack of cigarettes daily to 1/2 pack of cigarettes daily.  She remains on aspirin, currently off of Plavix.  The patient had an event that could have represented a migraine headache, the patient has a history of migraine with hemiplegic features in the past.  She is having 2-3 headaches a week currently.  I will initiate Topamax treatment.  As a child, the patient did have a history of seizures that were treated with phenobarbital and Dilantin, she has not had any seizures since her 12s.  She has been able to come off of seizure medications previously.  The patient will follow up through this office in about 3 months.  Jill Alexanders MD 09/02/2019 9:45 AM  [facility] [address] [place], [place] [address]  Phone [phone] Fax [phone]

## 2019-09-09 ENCOUNTER — Emergency Department (HOSPITAL_COMMUNITY): Payer: Medicare Other

## 2019-09-09 ENCOUNTER — Observation Stay (HOSPITAL_COMMUNITY)
Admission: EM | Admit: 2019-09-09 | Discharge: 2019-09-10 | Disposition: A | Discharge status: Home / Self Care | Payer: Medicare Other | Attending: Internal Medicine | Admitting: Internal Medicine

## 2019-09-09 ENCOUNTER — Other Ambulatory Visit: Payer: Self-pay

## 2019-09-09 ENCOUNTER — Encounter (HOSPITAL_COMMUNITY): Payer: Self-pay | Admitting: *Deleted

## 2019-09-09 DIAGNOSIS — G629 Polyneuropathy, unspecified: Secondary | ICD-10-CM | POA: Insufficient documentation

## 2019-09-09 DIAGNOSIS — Z888 Allergy status to other drugs, medicaments and biological substances status: Secondary | ICD-10-CM | POA: Diagnosis not present

## 2019-09-09 DIAGNOSIS — R0789 Other chest pain: Secondary | ICD-10-CM | POA: Diagnosis not present

## 2019-09-09 DIAGNOSIS — E785 Hyperlipidemia, unspecified: Secondary | ICD-10-CM | POA: Diagnosis not present

## 2019-09-09 DIAGNOSIS — M1990 Unspecified osteoarthritis, unspecified site: Secondary | ICD-10-CM | POA: Diagnosis not present

## 2019-09-09 DIAGNOSIS — Z8673 Personal history of transient ischemic attack (TIA), and cerebral infarction without residual deficits: Secondary | ICD-10-CM | POA: Diagnosis not present

## 2019-09-09 DIAGNOSIS — Z95810 Presence of automatic (implantable) cardiac defibrillator: Secondary | ICD-10-CM | POA: Diagnosis present

## 2019-09-09 DIAGNOSIS — I429 Cardiomyopathy, unspecified: Secondary | ICD-10-CM

## 2019-09-09 DIAGNOSIS — Z7982 Long term (current) use of aspirin: Secondary | ICD-10-CM | POA: Diagnosis not present

## 2019-09-09 DIAGNOSIS — I10 Essential (primary) hypertension: Secondary | ICD-10-CM | POA: Diagnosis present

## 2019-09-09 DIAGNOSIS — R079 Chest pain, unspecified: Secondary | ICD-10-CM | POA: Diagnosis present

## 2019-09-09 DIAGNOSIS — Z20822 Contact with and (suspected) exposure to covid-19: Secondary | ICD-10-CM | POA: Insufficient documentation

## 2019-09-09 DIAGNOSIS — Z7902 Long term (current) use of antithrombotics/antiplatelets: Secondary | ICD-10-CM | POA: Diagnosis not present

## 2019-09-09 DIAGNOSIS — E039 Hypothyroidism, unspecified: Secondary | ICD-10-CM | POA: Diagnosis not present

## 2019-09-09 DIAGNOSIS — Z79890 Hormone replacement therapy (postmenopausal): Secondary | ICD-10-CM | POA: Insufficient documentation

## 2019-09-09 DIAGNOSIS — K219 Gastro-esophageal reflux disease without esophagitis: Secondary | ICD-10-CM | POA: Insufficient documentation

## 2019-09-09 DIAGNOSIS — I5042 Chronic combined systolic (congestive) and diastolic (congestive) heart failure: Secondary | ICD-10-CM | POA: Diagnosis not present

## 2019-09-09 DIAGNOSIS — Z6841 Body Mass Index (BMI) 40.0 and over, adult: Secondary | ICD-10-CM | POA: Diagnosis not present

## 2019-09-09 DIAGNOSIS — F17210 Nicotine dependence, cigarettes, uncomplicated: Secondary | ICD-10-CM | POA: Diagnosis not present

## 2019-09-09 DIAGNOSIS — Z79899 Other long term (current) drug therapy: Secondary | ICD-10-CM | POA: Diagnosis not present

## 2019-09-09 DIAGNOSIS — Z7951 Long term (current) use of inhaled steroids: Secondary | ICD-10-CM | POA: Insufficient documentation

## 2019-09-09 DIAGNOSIS — J449 Chronic obstructive pulmonary disease, unspecified: Secondary | ICD-10-CM | POA: Diagnosis not present

## 2019-09-09 DIAGNOSIS — Z880 Allergy status to penicillin: Secondary | ICD-10-CM | POA: Insufficient documentation

## 2019-09-09 DIAGNOSIS — Z882 Allergy status to sulfonamides status: Secondary | ICD-10-CM | POA: Diagnosis not present

## 2019-09-09 DIAGNOSIS — I110 Hypertensive heart disease with heart failure: Secondary | ICD-10-CM | POA: Insufficient documentation

## 2019-09-09 DIAGNOSIS — E66813 Obesity, class 3: Secondary | ICD-10-CM | POA: Diagnosis present

## 2019-09-09 MED ORDER — ASPIRIN 81 MG PO CHEW
324.0000 mg | CHEWABLE_TABLET | Freq: Once | ORAL | Status: AC
  Administered 2019-09-09: 324 mg via ORAL
  Filled 2019-09-09: qty 4

## 2019-09-09 NOTE — ED Provider Notes (Signed)
[facility] EMERGENCY DEPARTMENT Provider Note   CSN: [id number] Arrival date & time: 09/09/19  2255     History Chief Complaint  Patient presents with  . Chest Pain    Melanie Cordova is a 59 y.o. female.  Patient with history of COPD, CHF with 20% EF in 2012 with AICD in place, previous stroke, hypertension, hyperlipidemia presenting from home by EMS.  She was sitting on her sofa this evening when she began to feel a "shocking pressure" to the left side of her chest that lasted for few minutes.  This happened on 3 separate occasions.  She believes her defibrillator shocked her 3 separate times.  She had shocking pain in her chest each time.  Now she has a soreness in her left chest.  But the shocking pain has resolved.  There is no associated shortness of breath, nausea, vomiting, diaphoresis.  No abdominal pain.  No cough or fever.  No leg pain or leg swelling.  Does not believe she has been shocked before.  Denies having a heart attack.  Denies having any stents in her heart.  States she has an AICD due to low ejection fraction.  The history is provided by the patient and the EMS personnel.  Chest Pain Associated symptoms: no abdominal pain, no fever, no headache, no nausea, no shortness of breath, no vomiting and no weakness        Past Medical History:  Diagnosis Date  . Anxiety   . Arthritis   . Asthma   . Automatic implantable cardioverter-defibrillator in situ    2013, july  . Bell palsy    states has had 3 episodes  . Cardiomyopathy 08/2010   Presented with congestive heart failure; EF of 15% and 2012; hypotension on medication precludes optimal dosing  . CHF (congestive heart failure) ([place])    a. EF 20% in 2012 with low-risk NST --> St. Jude ICD implantation by Dr. Lovena Le in 09/2011  . Chronic systolic heart failure ([place])   . COPD (chronic obstructive pulmonary disease) ([place])    2013  . Dysrhythmia   . Gastroesophageal reflux disease   . Headache(784.0)   .  Hot flashes 10/04/2016  . Hyperlipidemia   . Hypertension    09/2010-normal CMet and CBC; Lipid profile-116, 88, 25, 73  . Hypothyroidism    Recent TSH was normal.  . 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 Neuromuscular disorder (HCC)    Neuropathy, right foot and leg  . Neuropathy   . Obesity   . Pneumonia   . Seizures ([place])    last one at age 29  . Shortness of breath   . Stroke [facility])     stroke in 07/2012 and another in June, 2014  . Tobacco abuse    20 pack years    Patient Active Problem List   Diagnosis Date Noted  . Colorectal polyps   . History of colonic polyps 02/20/2019  . Constipation 02/20/2019  . Chest pain 12/28/2017  . Rectocele 10/04/2016  . Vaginal discharge 10/04/2016  . Screening for colorectal cancer 10/04/2016  . Hot flashes 10/04/2016  . Vitamin D deficiency 02/26/2016  . Cryptogenic stroke ([place]) 08/13/2015  . Weakness of face muscles 07/25/2015  . Morbid obesity due to excess calories ([place]) 03/05/2015  . Rectal bleeding 11/05/2013  . Chronic systolic heart failure ([place]) 05/15/2013  . Asthma 04/29/2013  . Migraine variant 11/24/2012  . Weakness 11/15/2012  . Gingival disease 07/25/2012  . TIA (  transient ischemic attack) s/p tPA 11/07/2011  . Automatic implantable cardioverter-defibrillator in situ 10/12/2011  . Hypokalemia 10/01/2011  . Tobacco abuse   . Hyperlipidemia   . Hypertension   . Gastroesophageal reflux disease   . Hypothyroidism   . Cardiomyopathy ([place]) 08/13/2010    Past Surgical History:  Procedure Laterality Date  . CESAREAN SECTION     X2  . COLONOSCOPY N/A 12/16/2013   One simple adenoma and 3 hyperplastic polyps. Small internal hemorrhoids. Left colon redundant. Due for surveillance 2020.   Marland Kitchen COLONOSCOPY WITH PROPOFOL N/A 06/04/2019   Procedure: COLONOSCOPY WITH PROPOFOL;  Surgeon: Danie Binder, MD;  [facility];  Service: Endoscopy;  Laterality: N/A;  10:30am  . EP IMPLANTABLE DEVICE      St. Jude  . EP IMPLANTABLE DEVICE N/A 08/13/2015   Procedure: Loop Recorder Insertion;  Surgeon: Evans Lance, MD;  Location: [place] CV LAB;  Service: Cardiovascular;  Laterality: N/A;  . IMPLANTABLE CARDIOVERTER DEFIBRILLATOR IMPLANT N/A 10/10/2011   Procedure: IMPLANTABLE CARDIOVERTER DEFIBRILLATOR IMPLANT;  Surgeon: Evans Lance, MD;  Location: [facility] CATH LAB;  Service: Cardiovascular;  Laterality: N/A;  . MULTIPLE EXTRACTIONS WITH ALVEOLOPLASTY N/A 09/24/2012   Procedure: MULTIPLE EXTRACION #2, 4, 6, 7 ,8, 9, 11, 13, 18, 20, 21, 22, 23, 24, 25, 26, 27, 29 WITH ALVEOLOPLASTY, BIOPSY OF PALATE LESION, REMOVA RIGHT LINGUAL TORUS;  Surgeon: Gae Bon, DDS;  Location: [place];  Service: Oral Surgery;  Laterality: N/A;  . POLYPECTOMY  06/04/2019   Procedure: POLYPECTOMY;  Surgeon: Danie Binder, MD;  Location: [facility];  Service: Endoscopy;;  . TEE WITHOUT CARDIOVERSION N/A 07/11/2012   Procedure: TRANSESOPHAGEAL ECHOCARDIOGRAM (TEE);  Surgeon: Thayer Headings, MD;  Location: [facility] ENDOSCOPY;  Service: Cardiovascular;  Laterality: N/A;  . TUBAL LIGATION       OB History    Gravida  3   Para  3   Term  3   Preterm      AB      Living  3     SAB      TAB      Ectopic      Multiple      Live Births  3           Family History  Problem Relation Age of Onset  . Cardiomyopathy Mother        ICD pacemaker-ischmic CM  . Heart failure Mother   . Hypertension Mother   . Diabetes Mother   . Thyroid disease Mother   . Cardiomyopathy Father        Deceased  . Coronary artery disease Father   . Heart failure Father   . Heart failure Brother   . Hypertension Brother   . Endometriosis Sister   . Heart disease Paternal Grandfather   . Heart disease Paternal Grandmother   . Heart disease Maternal Grandmother   . Heart disease Maternal Grandfather   . Hypertension Daughter   . Obesity Daughter   . Colon cancer Neg Hx   . Liver disease Neg Hx     Social  History   Tobacco Use  . Smoking status: Current Every Day Smoker    Packs/day: 0.50    Years: 21.00    Pack years: 10.50    Types: Cigarettes    Start date: 07/02/1983  . Smokeless tobacco: Never Used  Vaping Use  . Vaping Use: Former  Substance Use Topics  . Alcohol use: No  Alcohol/week: 0.0 standard drinks  . Drug use: No    Home Medications Prior to Admission medications   Medication Sig Start Date End Date Taking? Authorizing Provider  albuterol (PROVENTIL HFA;VENTOLIN HFA) 108 (90 BASE) MCG/ACT inhaler Inhale 2 puffs into the lungs every 6 (six) hours as needed for wheezing or shortness of breath. 07/12/12   Trinda Pascal, MD  aspirin EC 81 MG tablet Take 1 tablet (81 mg total) by mouth daily. 07/03/19   Donzetta Starch, NP  Biotin 10000 MCG TABS Take 10,000 mcg by mouth daily.     [provider]  budesonide-formoterol (SYMBICORT) 80-4.5 MCG/ACT inhaler Inhale 2 puffs into the lungs 2 (two) times daily.    [provider]  carvedilol (COREG) 12.5 MG tablet Take 25 mg by mouth 2 (two) times daily with a meal.    [provider]  cetirizine (ZYRTEC) 10 MG tablet Take 10 mg by mouth daily.    [provider]  Cholecalciferol (VITAMIN D3) 5000 units CAPS TAKE ONE CAPSULE BY MOUTH DAILY 09/22/17   [name], MD  clobetasol (TEMOVATE) 0.05 % external solution Apply 1 application topically 2 (two) times daily as needed (Apply to scalp.).  07/24/15   [provider]  clopidogrel (PLAVIX) 75 MG tablet Take 1 tablet (75 mg total) by mouth daily. 07/03/19   Rosalin Hawking, MD  diclofenac sodium (VOLTAREN) 1 % GEL Apply 2 g topically 4 (four) times daily as needed (pain).     [provider]  diphenhydrAMINE (BENADRYL) 25 MG tablet Take 25 mg by mouth in the morning and at bedtime.     [provider]  ENTRESTO 24-26 MG TAKE ONE TABLET BY MOUTH 2 TIMES A DAY Patient taking differently: Take 1 tablet by mouth 2 (two)  times daily.  12/31/18   Evans Lance, MD  famotidine (PEPCID) 20 MG tablet Take 20 mg by mouth daily as needed for heartburn or indigestion.    [provider]  Ferrous Sulfate (IRON) 28 MG TABS Take 28 mg by mouth daily.     [provider]  fluticasone (FLONASE) 50 MCG/ACT nasal spray Place 2 sprays into both nostrils daily.     [provider]  furosemide (LASIX) 40 MG tablet Take 1 tablet (40 mg total) by mouth 2 (two) times daily. 08/29/12   Yehuda Savannah, MD  gabapentin (NEURONTIN) 800 MG tablet Take 800 mg by mouth every 6 (six) hours as needed (pain).  10/30/15   [provider]  ketoconazole (NIZORAL) 2 % cream Apply 1 application topically 2 (two) times daily.  10/21/15   [provider]  levothyroxine (SYNTHROID, LEVOTHROID) 200 MCG tablet Take 1 tablet (200 mcg total) by mouth daily before breakfast. 03/01/18   Nida, Marella Chimes, MD  Multiple Vitamins-Minerals (CENTRAVITES 50 PLUS PO) Take 1 tablet by mouth daily.     [provider]  pantoprazole (PROTONIX) 20 MG tablet Take 1 tablet (20 mg total) by mouth daily. 06/21/18   Strader, Fransisco Hertz, PA-C  potassium chloride SA (K-DUR) 20 MEQ tablet Take 20 mEq by mouth daily.  05/23/18   [provider]  psyllium (METAMUCIL) 58.6 % packet Take 1 packet by mouth daily.    [provider]  rosuvastatin (CRESTOR) 20 MG tablet Take 20 mg by mouth daily.    [provider]  Simethicone (GAS-X PO) Take 1 tablet by mouth in the morning and at bedtime.     [provider]  topiramate (TOPAMAX) 25 MG tablet Take one tablet at night for one week, then take 2 tablets at night for one week, then take 3 tablets at night. 09/02/19   Kathrynn Ducking, MD  Vitamin D, Ergocalciferol, (DRISDOL) 1.25 MG (50000 UNIT) CAPS capsule Take 50,000 Units by mouth every 30 (thirty) days.    [provider]    Allergies    Fish allergy, Iodinated diagnostic agents,  Iodine, [place], Penicillins, Lipitor [atorvastatin], Spironolactone, and Sulfa antibiotics  Review of Systems   Review of Systems  Constitutional: Negative for activity change, appetite change and fever.  HENT: Negative for congestion and rhinorrhea.   Eyes: Negative for visual disturbance.  Respiratory: Positive for chest tightness. Negative for shortness of breath.   Cardiovascular: Positive for chest pain and leg swelling.  Gastrointestinal: Negative for abdominal pain, nausea and vomiting.  Genitourinary: Negative for dysuria, hematuria and vaginal bleeding.  Musculoskeletal: Negative for arthralgias and myalgias.  Skin: Negative for rash.  Neurological: Negative for weakness, light-headedness and headaches.   all other systems are negative except as noted in the HPI and PMH.    Physical Exam Updated Vital Signs BP 139/83   Pulse 72   Temp 97.7 F (36.5 C) (Oral)   Resp 20   Ht 5\' 7"  (1.702 m)   Wt 133 kg   SpO2 96%   BMI 45.92 kg/m   Physical Exam Vitals and nursing note reviewed.  Constitutional:      General: She is not in acute distress.    Appearance: She is well-developed. She is obese. She is not ill-appearing.     Comments: No distress, speaking full sentences  HENT:     Head: Normocephalic and atraumatic.     Mouth/Throat:     Pharynx: No oropharyngeal exudate.  Eyes:     Conjunctiva/sclera: Conjunctivae normal.     Pupils: Pupils are equal, round, and reactive to light.  Neck:     Comments: No meningismus. Cardiovascular:     Rate and Rhythm: Normal rate and regular rhythm.     Heart sounds: Normal heart sounds. No murmur heard.   Pulmonary:     Effort: Pulmonary effort is normal. No respiratory distress.     Breath sounds: Normal breath sounds.     Comments: Left-sided chest tenderness reproducible to palpation Chest:     Chest wall: Tenderness present.  Abdominal:     Palpations: Abdomen is soft.     Tenderness: There is no abdominal  tenderness. There is no guarding or rebound.  Musculoskeletal:        General: No tenderness. Normal range of motion.     Cervical back: Normal range of motion and neck supple.     Right lower leg: Edema present.     Left lower leg: Edema present.  Skin:    General: Skin is warm.  Neurological:     General: No focal deficit present.     Mental Status: She is alert and oriented to person, place, and time. Mental status is at baseline.     Cranial Nerves: No cranial nerve deficit.     Motor: No abnormal muscle tone.     Coordination: Coordination normal.     Comments: No ataxia on finger to nose bilaterally. No pronator drift. 5/5 strength throughout. CN 2-12 intact.Equal grip strength. Sensation intact.   Psychiatric:        Behavior: Behavior normal.     ED Results / Procedures / Treatments  Labs (all labs ordered are listed, but only abnormal results are displayed) Labs Reviewed  BASIC METABOLIC PANEL - Abnormal; Notable for the following components:      Result Value   Potassium 3.4 (*)    Glucose, Bld 106 (*)    All other components within normal limits  SARS CORONAVIRUS 2 BY RT PCR (HOSPITAL ORDER, [facility])  CBC WITH DIFFERENTIAL/PLATELET  BRAIN NATRIURETIC PEPTIDE  MAGNESIUM  TROPONIN I (HIGH SENSITIVITY)  TROPONIN I (HIGH SENSITIVITY)    EKG EKG Interpretation  Date/Time:  Monday September 09 2019 23:10:53 EDT Ventricular Rate:  73 PR Interval:    QRS Duration: 123 QT Interval:  448 QTC Calculation: 494 R Axis:   -32 Text Interpretation: Sinus rhythm Left ventricular hypertrophy Borderline T abnormalities, anterior leads Borderline prolonged QT interval T wave flattening Confirmed by Ezequiel Essex [phone]) on 09/09/2019 11:16:39 PM   Radiology DG Chest Portable 1 View  Result Date: 09/09/2019 CLINICAL DATA:  Defibrillator fired, left-sided chest pain EXAM: PORTABLE CHEST 1 VIEW COMPARISON:  04/08/2018 FINDINGS: Single frontal view  of the chest demonstrates stable defibrillator lead projecting over the right ventricle. Cardiac silhouette is unremarkable. No airspace disease, effusion, or pneumothorax. IMPRESSION: 1. No acute intrathoracic process. Electronically Signed   By: Randa Ngo M.D.   On: 09/09/2019 23:41    Procedures Procedures (including critical care time)  Medications Ordered in ED Medications  aspirin chewable tablet 324 mg (has no administration in time range)    ED Course  I have reviewed the triage vital signs and the nursing notes.  Pertinent labs & imaging results that were available during my care of the patient were reviewed by me and considered in my medical decision making (see chart for details).    MDM Rules/Calculators/A&P                         Patient here with 3 episodes of chest pain and suspected shocks from her AICD.  She is having some soreness currently that is reproducible.  EKG shows no acute ST elevation.  Does show T wave flattening inferolaterally.  Interrogation shows 0 episodes of VF or VT today.  No shocks delivered.  Patient given aspirin and nitroglycerin.  Troponin is negative.  Last stress test in the system was in 2017 was negative.  Interrogation of AICD shows no shocks given today.  No arrhythmias noted. Last echocardiogram in 2021 showed improvement in ejection fraction to 45/50%.  Patient given aspirin and nitroglycerin as well as for her chest pain.  Troponin is negative.  Still having intermittent episodes of chest pain. No evidence of AICD shocks. Low suspicion for PE or dissection.  Observation chest pain rule out discussed with Dr. Darrick Meigs. Final Clinical Impression(s) / ED Diagnoses Final diagnoses:  Nonspecific chest pain    Rx / DC Orders ED Discharge Orders    None       Coreon Simkins, Annie Main, MD 09/10/19 [phone]

## 2019-09-09 NOTE — ED Triage Notes (Signed)
Pt c/o left side chest pain that started about 10 pm tonight, pt reports that her defibrillator has fired 3 separate times tonight as well,

## 2019-09-10 DIAGNOSIS — I255 Ischemic cardiomyopathy: Secondary | ICD-10-CM

## 2019-09-10 DIAGNOSIS — Z95810 Presence of automatic (implantable) cardiac defibrillator: Secondary | ICD-10-CM | POA: Diagnosis not present

## 2019-09-10 DIAGNOSIS — I10 Essential (primary) hypertension: Secondary | ICD-10-CM

## 2019-09-10 DIAGNOSIS — R079 Chest pain, unspecified: Secondary | ICD-10-CM

## 2019-09-10 DIAGNOSIS — E038 Other specified hypothyroidism: Secondary | ICD-10-CM | POA: Diagnosis not present

## 2019-09-10 DIAGNOSIS — R0789 Other chest pain: Secondary | ICD-10-CM | POA: Diagnosis not present

## 2019-09-10 LAB — BASIC METABOLIC PANEL
Anion gap: 11 (ref 5–15)
BUN: 14 mg/dL (ref 6–20)
CO2: 28 mmol/L (ref 22–32)
Calcium: 9.2 mg/dL (ref 8.9–10.3)
Chloride: 100 mmol/L (ref 98–111)
Creatinine, Ser: 0.72 mg/dL (ref 0.44–1.00)
GFR calc Af Amer: >60 mL/min (ref >60)
GFR calc non Af Amer: >60 mL/min (ref >60)
Glucose, Bld: 106 mg/dL — ABNORMAL HIGH (ref 70–99)
Potassium: 3.4 mmol/L — ABNORMAL LOW (ref 3.5–5.1)
Sodium: 139 mmol/L (ref 135–145)

## 2019-09-10 LAB — CBC WITH DIFFERENTIAL/PLATELET
Abs Immature Granulocytes: 0.04 10*3/uL (ref 0.00–0.07)
Basophils Absolute: 0.1 10*3/uL (ref 0.0–0.1)
Basophils Relative: 1 %
Eosinophils Absolute: 0.4 10*3/uL (ref 0.0–0.5)
Eosinophils Relative: 5 %
HCT: 41.5 % (ref 36.0–46.0)
Hemoglobin: 13.3 g/dL (ref 12.0–15.0)
Immature Granulocytes: 0 %
Lymphocytes Relative: 29 %
Lymphs Abs: 2.7 10*3/uL (ref 0.7–4.0)
MCH: 29.6 pg (ref 26.0–34.0)
MCHC: 32 g/dL (ref 30.0–36.0)
MCV: 92.2 fL (ref 80.0–100.0)
Monocytes Absolute: 0.7 10*3/uL (ref 0.1–1.0)
Monocytes Relative: 8 %
Neutro Abs: 5.3 10*3/uL (ref 1.7–7.7)
Neutrophils Relative %: 57 %
Platelets: 245 10*3/uL (ref 150–400)
RBC: 4.5 MIL/uL (ref 3.87–5.11)
RDW: 14.6 % (ref 11.5–15.5)
WBC: 9.2 10*3/uL (ref 4.0–10.5)
nRBC: 0 % (ref 0.0–0.2)

## 2019-09-10 LAB — TROPONIN I (HIGH SENSITIVITY)
Troponin I (High Sensitivity): 4 ng/L (ref <18)
Troponin I (High Sensitivity): 4 ng/L (ref <18)
Troponin I (High Sensitivity): 5 ng/L (ref <18)
Troponin I (High Sensitivity): 5 ng/L (ref <18)

## 2019-09-10 LAB — SARS CORONAVIRUS 2 BY RT PCR (HOSPITAL ORDER, PERFORMED IN ~~LOC~~ HOSPITAL LAB): SARS Coronavirus 2: NEGATIVE

## 2019-09-10 LAB — BRAIN NATRIURETIC PEPTIDE: B Natriuretic Peptide: 22 pg/mL (ref 0.0–100.0)

## 2019-09-10 LAB — MAGNESIUM: Magnesium: 2.2 mg/dL (ref 1.7–2.4)

## 2019-09-10 MED ORDER — NITROGLYCERIN 0.4 MG SL SUBL
0.4000 mg | SUBLINGUAL_TABLET | Freq: Once | SUBLINGUAL | Status: AC
  Administered 2019-09-10: 0.4 mg via SUBLINGUAL

## 2019-09-10 MED ORDER — CLOPIDOGREL BISULFATE 75 MG PO TABS
75.0000 mg | ORAL_TABLET | Freq: Every day | ORAL | Status: DC
  Administered 2019-09-10: 75 mg via ORAL
  Filled 2019-09-10: qty 1

## 2019-09-10 MED ORDER — POTASSIUM CHLORIDE CRYS ER 20 MEQ PO TBCR
40.0000 meq | EXTENDED_RELEASE_TABLET | Freq: Once | ORAL | Status: AC
  Administered 2019-09-10: 20 meq via ORAL
  Filled 2019-09-10: qty 2

## 2019-09-10 MED ORDER — ASPIRIN EC 81 MG PO TBEC
81.0000 mg | DELAYED_RELEASE_TABLET | Freq: Every day | ORAL | Status: DC
  Administered 2019-09-10: 81 mg via ORAL
  Filled 2019-09-10: qty 1

## 2019-09-10 MED ORDER — GABAPENTIN 800 MG PO TABS: 800.0000 mg | ORAL_TABLET | Freq: Four times a day (QID) | ORAL | Status: DC | PRN

## 2019-09-10 MED ORDER — PANTOPRAZOLE SODIUM 20 MG PO TBEC: 20.0000 mg | DELAYED_RELEASE_TABLET | Freq: Every day | ORAL | Status: DC

## 2019-09-10 MED ORDER — DIPHENHYDRAMINE HCL 25 MG PO CAPS
25.0000 mg | ORAL_CAPSULE | Freq: Two times a day (BID) | ORAL | Status: DC
  Administered 2019-09-10: 25 mg via ORAL
  Filled 2019-09-10: qty 1

## 2019-09-10 MED ORDER — FLUTICASONE PROPIONATE 50 MCG/ACT NA SUSP: 2.0000 | Freq: Every day | NASAL | Status: DC

## 2019-09-10 MED ORDER — NITROGLYCERIN 0.4 MG SL SUBL
SUBLINGUAL_TABLET | SUBLINGUAL | Status: AC
  Filled 2019-09-10: qty 1

## 2019-09-10 MED ORDER — PANTOPRAZOLE SODIUM 40 MG PO TBEC
40.0000 mg | DELAYED_RELEASE_TABLET | Freq: Every day | ORAL | Status: DC
  Filled 2019-09-10: qty 1

## 2019-09-10 MED ORDER — ONDANSETRON HCL 4 MG/2ML IJ SOLN: 4.0000 mg | Freq: Four times a day (QID) | INTRAMUSCULAR | Status: DC | PRN

## 2019-09-10 MED ORDER — CARVEDILOL 12.5 MG PO TABS: 25.0000 mg | ORAL_TABLET | Freq: Two times a day (BID) | ORAL | Status: DC

## 2019-09-10 MED ORDER — LEVOTHYROXINE SODIUM 50 MCG PO TABS: 200.0000 ug | ORAL_TABLET | Freq: Every day | ORAL | Status: DC

## 2019-09-10 MED ORDER — MOMETASONE FURO-FORMOTEROL FUM 100-5 MCG/ACT IN AERO: 2.0000 | INHALATION_SPRAY | Freq: Two times a day (BID) | RESPIRATORY_TRACT | Status: DC

## 2019-09-10 MED ORDER — ENOXAPARIN SODIUM 40 MG/0.4ML ~~LOC~~ SOLN: 40.0000 mg | SUBCUTANEOUS | Status: DC

## 2019-09-10 MED ORDER — PSYLLIUM 95 % PO PACK: 1.0000 | PACK | Freq: Every day | ORAL | Status: DC

## 2019-09-10 MED ORDER — LORATADINE 10 MG PO TABS
10.0000 mg | ORAL_TABLET | Freq: Every day | ORAL | Status: DC
  Administered 2019-09-10: 10 mg via ORAL
  Filled 2019-09-10: qty 1

## 2019-09-10 MED ORDER — ROSUVASTATIN CALCIUM 20 MG PO TABS: 20.0000 mg | ORAL_TABLET | Freq: Every day | ORAL | Status: DC

## 2019-09-10 MED ORDER — ACETAMINOPHEN 325 MG PO TABS: 650.0000 mg | ORAL_TABLET | ORAL | Status: DC | PRN

## 2019-09-10 MED ORDER — FUROSEMIDE 40 MG PO TABS
40.0000 mg | ORAL_TABLET | Freq: Two times a day (BID) | ORAL | Status: DC
  Administered 2019-09-10: 40 mg via ORAL
  Filled 2019-09-10: qty 1

## 2019-09-10 MED ORDER — FAMOTIDINE 20 MG PO TABS: 20.0000 mg | ORAL_TABLET | Freq: Every day | ORAL | Status: DC | PRN

## 2019-09-10 MED ORDER — SACUBITRIL-VALSARTAN 24-26 MG PO TABS: 1.0000 | ORAL_TABLET | Freq: Two times a day (BID) | ORAL | Status: DC

## 2019-09-10 MED ORDER — TOPIRAMATE 25 MG PO TABS: 50.0000 mg | ORAL_TABLET | Freq: Every day | ORAL | Status: DC

## 2019-09-10 NOTE — ED Notes (Signed)
Meal provided 

## 2019-09-10 NOTE — ED Notes (Signed)
Pt unhooked to go to [facility]

## 2019-09-10 NOTE — Consult Note (Addendum)
Cardiology Consult    Patient ID: Melanie Cordova; [id number]; 01-Dec-1960   Admit date: 09/09/2019 Date of Consult: 09/10/2019  Primary Care Provider: Rosita Fire, MD Primary Cardiologist: [name], MD   Patient Profile    Melanie Cordova is a 59 y.o. female with past medical history of presumed nonischemic cardiomyopathy (EF 20% in 2012 with low-risk NST --> St. Jude ICD implantation by Dr. 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 Roderic Palau.   History of Present Illness    Melanie Cordova was last examined by Dr. Lovena Le in 10/2018 and denied any recent chest pain or dyspnea at that time. She did report being under financial stress and several family members had recently passed away. She was continued on her current cardiac medications including ASA 81 mg twice daily, Coreg 25 mg twice daily, Lasix 40 mg twice daily, Crestor 20mg  daily and Entresto 24-26mg  BID.   In the interim, she was admitted to [facility] in 06/2019 and she had received TPA for strokelike symptoms but CT imaging showed no acute abnormalities and she was noted to have an old left cerebellar infarct. She has followed with Neurology in the outpatient setting and it was felt she could have experienced a migraine headache.    She presented to [facility] ED on 09/09/2019 as she thought her defibrillator had fired 3 times. Her device was interrogated while in the ED and showed no episodes of VT or VF earlier that day and no shocks had been delivered. She reports being in her usual state of health until yesterday evening when she experienced 3 separate episodes of shooting pain along her left pectoral region.  Was sitting and watching television when the episodes occurred. Describes the sensation as putting her finger in an electrical socket. No associated diaphoresis, nausea or vomiting. She has baseline dyspnea on exertion but this has been stable. She did spend a majority of yesterday morning doing a month's worth of laundry but reports she had help with carrying her laundry baskets.   Initial labs show WBC 9.2, Hgb 13.3, platelets 245, Na+ 139, K+ 3.4 and creatinine 0.72. Mg 2.2. BNP 22.0.  Initial and repeat HS Troponin values have been negative at 5, 5 and 4. COVID negative. CXR shows no acute intracranial abnormalities. EKG shows NSR, HR 73 with LVH and nonspecific IVCD. Telemetry shows NSR with occasional PVC's but no episodes of NSVT or significant arrhythmias.    Past Medical History:  Diagnosis Date  . Anxiety   . Arthritis   . Asthma   . Automatic implantable cardioverter-defibrillator in situ    2013, july  . Bell palsy    states has had 3 episodes  . Cardiomyopathy 08/2010   Presented with congestive heart failure; EF of 15% and 2012; hypotension on medication precludes optimal dosing  . CHF (congestive heart failure) ([place])    a. EF 20% in 2012 with low-risk NST --> St. Jude ICD implantation by Dr. Lovena Le in 09/2011  . Chronic systolic heart failure ([place])   . COPD (chronic obstructive pulmonary disease) ([place])    2013  . Dysrhythmia   . Gastroesophageal reflux disease   .  Headache(784.0)   . Hot flashes 10/04/2016  . Hyperlipidemia   . Hypertension    09/2010-normal CMet and CBC; Lipid profile-116, 88, 25, 73  . Hypothyroidism    Recent TSH was normal.  . 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 Neuromuscular disorder (HCC)    Neuropathy, right foot and leg  . Neuropathy   . Obesity   . Pneumonia   . Seizures ([place])    last one at age 17  . Shortness of breath   . Stroke [facility])     stroke in 07/2012 and another in June, 2014  . Tobacco abuse    20 pack  years    Past Surgical History:  Procedure Laterality Date  . CESAREAN SECTION     X2  . COLONOSCOPY N/A 12/16/2013   One simple adenoma and 3 hyperplastic polyps. Small internal hemorrhoids. Left colon redundant. Due for surveillance 2020.   Marland Kitchen COLONOSCOPY WITH PROPOFOL N/A 06/04/2019   Procedure: COLONOSCOPY WITH PROPOFOL;  Surgeon: Danie Binder, MD;  [facility];  Service: Endoscopy;  Laterality: N/A;  10:30am  . EP IMPLANTABLE DEVICE     St. Jude  . EP IMPLANTABLE DEVICE N/A 08/13/2015   Procedure: Loop Recorder Insertion;  Surgeon: Evans Lance, MD;  Location: [place] [facility] LAB;  Service: Cardiovascular;  Laterality: N/A;  . IMPLANTABLE CARDIOVERTER DEFIBRILLATOR IMPLANT N/A 10/10/2011   Procedure: IMPLANTABLE CARDIOVERTER DEFIBRILLATOR IMPLANT;  Surgeon: Evans Lance, MD;  Location: [facility] CATH LAB;  Service: Cardiovascular;  Laterality: N/A;  . MULTIPLE EXTRACTIONS WITH ALVEOLOPLASTY N/A 09/24/2012   Procedure: MULTIPLE EXTRACION #2, 4, 6, 7 ,8, 9, 11, 13, 18, 20, 21, 22, 23, 24, 25, 26, 27, 29 WITH ALVEOLOPLASTY, BIOPSY OF PALATE LESION, REMOVA RIGHT LINGUAL TORUS;  Surgeon: Gae Bon, DDS;  Location: [place];  Service: Oral Surgery;  Laterality: N/A;  . POLYPECTOMY  06/04/2019   Procedure: POLYPECTOMY;  Surgeon: Danie Binder, MD;  [facility];  Service: Endoscopy;;  . TEE WITHOUT CARDIOVERSION N/A 07/11/2012   Procedure: TRANSESOPHAGEAL ECHOCARDIOGRAM (TEE);  Surgeon: Thayer Headings, MD;  Location: [place];  Service: Cardiovascular;  Laterality: N/A;  . TUBAL LIGATION       Home Medications:  Prior to Admission medications   Medication Sig Start Date End Date Taking? Authorizing Provider  albuterol (PROVENTIL HFA;VENTOLIN HFA) 108 (90 BASE) MCG/ACT inhaler Inhale 2 puffs into the lungs every 6 (six) hours as needed for wheezing or shortness of breath. 07/12/12  Yes Ziemer, Kelli Hope, MD  aspirin EC 81 MG tablet Take 1 tablet (81 mg total) by mouth  daily. 07/03/19  Yes Donzetta Starch, NP  Biotin 10000 MCG TABS Take 10,000 mcg by mouth daily.    Yes [provider]  budesonide-formoterol (SYMBICORT) 80-4.5 MCG/ACT inhaler Inhale 2 puffs into the lungs 2 (two) times daily.   Yes [provider]  carvedilol (COREG) 12.5 MG tablet Take 25 mg by mouth 2 (two) times daily with a meal.   Yes [provider]  cetirizine (ZYRTEC) 10 MG tablet Take 10 mg by mouth daily.   Yes [provider]  clobetasol (TEMOVATE) 0.05 % external solution Apply 1 application topically 2 (two) times daily as needed (Apply to scalp.).  07/24/15  Yes [provider]  clopidogrel (PLAVIX) 75 MG tablet Take 1 tablet (75 mg total) by mouth daily. 07/03/19  Yes Rosalin Hawking, MD  diclofenac sodium (VOLTAREN) 1 % GEL Apply 2  g topically 4 (four) times daily as needed (pain).    Yes [provider]  diphenhydrAMINE (BENADRYL) 25 MG tablet Take 25 mg by mouth in the morning and at bedtime.    Yes [provider]  ENTRESTO 24-26 MG TAKE ONE TABLET BY MOUTH 2 TIMES A DAY Patient taking differently: Take 1 tablet by mouth 2 (two) times daily.  12/31/18  Yes Evans Lance, MD  famotidine (PEPCID) 20 MG tablet Take 20 mg by mouth daily as needed for heartburn or indigestion.   Yes [provider]  Ferrous Sulfate (IRON) 28 MG TABS Take 28 mg by mouth daily.    Yes [provider]  fluticasone (FLONASE) 50 MCG/ACT nasal spray Place 2 sprays into both nostrils daily.    Yes [provider]  furosemide (LASIX) 40 MG tablet Take 1 tablet (40 mg total) by mouth 2 (two) times daily. 08/29/12  Yes Rothbart, Cristopher Estimable, MD  gabapentin (NEURONTIN) 800 MG tablet Take 800 mg by mouth every 6 (six) hours as needed (pain).  10/30/15  Yes [provider]  ketoconazole (NIZORAL) 2 % cream Apply 1 application topically 2 (two) times daily.  10/21/15  Yes [provider]  levothyroxine (SYNTHROID,  LEVOTHROID) 200 MCG tablet Take 1 tablet (200 mcg total) by mouth daily before breakfast. 03/01/18  Yes Nida, Marella Chimes, MD  Multiple Vitamins-Minerals (CENTRAVITES 50 PLUS PO) Take 1 tablet by mouth daily.    Yes [provider]  pantoprazole (PROTONIX) 20 MG tablet Take 1 tablet (20 mg total) by mouth daily. 06/21/18  Yes Strader, [place] M, PA-C  potassium chloride SA (K-DUR) 20 MEQ tablet Take 20 mEq by mouth daily.  05/23/18  Yes [provider]  psyllium (METAMUCIL) 58.6 % packet Take 1 packet by mouth daily.   Yes [provider]  rosuvastatin (CRESTOR) 20 MG tablet Take 20 mg by mouth daily.   Yes [provider]  Simethicone (GAS-X PO) Take 1 tablet by mouth in the morning and at bedtime.    Yes [provider]  topiramate (TOPAMAX) 25 MG tablet Take one tablet at night for one week, then take 2 tablets at night for one week, then take 3 tablets at night. 09/02/19  Yes Kathrynn Ducking, MD  Vitamin D, Ergocalciferol, (DRISDOL) 1.25 MG (50000 UNIT) CAPS capsule Take 50,000 Units by mouth every 30 (thirty) days.   Yes [provider]  Cholecalciferol (VITAMIN D3) 5000 units CAPS TAKE ONE CAPSULE BY MOUTH DAILY Patient not taking: Reported on 09/10/2019 09/22/17   [name], MD    Inpatient Medications: Scheduled Meds: . aspirin EC  81 mg Oral Daily  . carvedilol  25 mg Oral BID WC  . clopidogrel  75 mg Oral Daily  . diphenhydrAMINE  25 mg Oral BID  . enoxaparin (LOVENOX) injection  40 mg Subcutaneous Q24H  . fluticasone  2 spray Each Nare Daily  . furosemide  40 mg Oral BID  . [START ON 09/11/2019] levothyroxine  200 mcg Oral QAC breakfast  . loratadine  10 mg Oral Daily  . mometasone-formoterol  2 puff Inhalation BID  . nitroGLYCERIN      . pantoprazole  20 mg Oral Daily  . potassium chloride  40 mEq Oral Once  . psyllium  1 packet Oral Daily  . rosuvastatin  20 mg Oral Daily  . sacubitril-valsartan  1 tablet  Oral BID  . topiramate  50 mg Oral QHS   Continuous Infusions:  PRN Meds: acetaminophen, famotidine, gabapentin, ondansetron (ZOFRAN) IV  Allergies:    Allergies  Allergen Reactions  . Fish Allergy Anaphylaxis  . Iodinated Diagnostic Agents Hives and Other (See Comments)    Pulmonary problems; no frank respiratory arrest  . Iodine Hives and Other (See Comments)    Pulmonary Problems   . Lentil Anaphylaxis  . Penicillins Anaphylaxis and Shortness Of Breath    Has patient had a PCN reaction causing immediate rash, facial/tongue/throat swelling, SOB or lightheadedness with hypotension: Yes Has patient had a PCN reaction causing severe rash involving mucus membranes or skin necrosis: No Has patient had a PCN reaction that required hospitalization No Has patient had a PCN reaction occurring within the last 10 years: No If all of the above answers are "NO", then may proceed with Cephalosporin use.  Hair loss  . Lipitor [Atorvastatin] Itching and Rash  . Spironolactone Rash  . Sulfa Antibiotics Other (See Comments)    Unknown    Social History:   Social History   Socioeconomic History  . Marital status: Divorced    Spouse name: Not on file  . Number of children: 2  . Years of education: Not on file  . Highest education level: Not on file  Occupational History    Employer: [employer]    Comment: Works in Environmental consultant  Tobacco Use  . Smoking status: Current Every Day Smoker    Packs/day: 0.50    Years: 21.00    Pack years: 10.50    Types: Cigarettes    Start date: 07/02/1983  . Smokeless tobacco: Never Used  Vaping Use  . Vaping Use: Former  Substance and Sexual Activity  . Alcohol use: No    Alcohol/week: 0.0 standard drinks  . Drug use: No  . Sexual activity: Not Currently    Birth control/protection: Abstinence, Surgical    Comment: tubal  Other Topics Concern  . Not on file  Social History Narrative  . Not on file   Social Determinants of Health    Financial Resource Strain:   . Difficulty of Paying Living Expenses:   Food Insecurity:   . Worried About Charity fundraiser in the Last Year:   . Arboriculturist in the Last Year:   Transportation Needs: No Transportation Needs  . Lack of Transportation (Medical): No  . Lack of Transportation (Non-Medical): No  Physical Activity:   . Days of Exercise per Week:   . Minutes of Exercise per Session:   Stress:   . Feeling of Stress :   Social Connections:   . Frequency of Communication with Friends and Family:   . Frequency of Social Gatherings with Friends and Family:   . Attends Religious Services:   . Active Member of Clubs or Organizations:   . Attends Archivist Meetings:   Marland Kitchen Marital Status:   Intimate Partner Violence:   . Fear of Current or Ex-Partner:   . Emotionally Abused:   Marland Kitchen Physically Abused:   . Sexually Abused:      Family History:    Family History  Problem Relation Age of Onset  . Cardiomyopathy Mother        ICD pacemaker-ischmic CM  . Heart failure Mother   . Hypertension Mother   . Diabetes Mother   . Thyroid disease Mother   . Cardiomyopathy Father        Deceased  . Coronary artery disease Father   . Heart failure Father   .  Heart failure Brother   . Hypertension Brother   . Endometriosis Sister   . Heart disease Paternal Grandfather   . Heart disease Paternal Grandmother   . Heart disease Maternal Grandmother   . Heart disease Maternal Grandfather   . Hypertension Daughter   . Obesity Daughter   . Colon cancer Neg Hx   . Liver disease Neg Hx       Review of Systems    General:  No chills, fever, night sweats or weight changes.  Cardiovascular:  No edema, orthopnea, palpitations, paroxysmal nocturnal dyspnea. Positive for chest pain and dyspnea on exertion.  Dermatological: No rash, lesions/masses Respiratory: No cough, dyspnea Urologic: No hematuria, dysuria Abdominal:   No nausea, vomiting, diarrhea, bright red blood  per rectum, melena, or hematemesis Neurologic:  No visual changes, wkns, changes in mental status. All other systems reviewed and are otherwise negative except as noted above.  Physical Exam/Data    Vitals:   09/10/19 0639 09/10/19 0640 09/10/19 0641 09/10/19 0642  BP:      Pulse:      Resp: 15 16 17 14   Temp:      TempSrc:      SpO2:      Weight:      Height:       No intake or output data in the 24 hours ending 09/10/19 1021 Filed Weights   09/09/19 2308  Weight: 133 kg   Body mass index is 45.92 kg/m.   General: Pleasant obese Caucasian female appearing in NAD Psych: Normal affect. Neuro: Alert and oriented X 3. Moves all extremities spontaneously. HEENT: Normal  Neck: Supple without bruits or JVD. Lungs:  Resp regular and unlabored, CTA without wheezing or rales. Heart: RRR no s3, s4, or murmurs. Abdomen: Soft, non-tender, non-distended, BS + x 4.  Extremities: No clubbing or cyanosis. Trace ankle edema bilaterally. DP/PT/Radials 2+ and equal bilaterally.   EKG:  The EKG was personally reviewed and demonstrates: NSR, HR 73 with LVH and nonspecific IVCD.  Telemetry:  Telemetry was personally reviewed and demonstrates: NSR with occasional PVC's but no episodes of NSVT or significant arrhythmias.    Labs/Studies     Relevant CV Studies:  Echocardiogram: 12/2017 Study Conclusions   - Left ventricle: The cavity size was moderately dilated. Wall  thickness was increased in a pattern of moderate LVH. Systolic  function was moderately reduced. The estimated ejection fraction  was in the range of 35% to 40%. Diffuse hypokinesis. Doppler  parameters are consistent with abnormal left ventricular  relaxation (grade 1 diastolic dysfunction).  - Aortic valve: Moderately calcified annulus. Trileaflet;  moderately thickened leaflets. Valve area (VTI): 1.59 cm^2.  - Mitral valve: Moderately calcified annulus. Normal thickness  leaflets .  - Atrial septum:  No defect or patent foramen ovale was identified.  - Technically adequate study.   NST: 12/2017  There was no ST segment deviation noted during stress.  Findings consistent with prior apical, basal septal, and inferolateral myocardial infarction. There is no current ishemia.  This is a high risk study. Risk based on decreased LVEF, there is no myocardium currently at jeopardy.  The left ventricular ejection fraction is moderately decreased (30-44%).  Echocardiogram: 06/2019 IMPRESSIONS    1. Left ventricular ejection fraction, by estimation, is 45 to 50%. The  left ventricle has mildly decreased function. Left ventricular endocardial  border not optimally defined to evaluate regional wall motion. There is  mild concentric left ventricular  hypertrophy. Left ventricular diastolic parameters are  consistent with  Grade I diastolic dysfunction (impaired relaxation).  2. Right ventricular systolic function is normal. The right ventricular  size is mildly enlarged. Tricuspid regurgitation signal is inadequate for  assessing PA pressure.  3. The mitral valve is grossly normal. Trivial mitral valve  regurgitation. No evidence of mitral stenosis.  4. The aortic valve is grossly normal. Aortic valve regurgitation is not  visualized. No aortic stenosis is present.  5. The inferior vena cava is normal in size with greater than 50%  respiratory variability, suggesting right atrial pressure of 3 mmHg.   Laboratory Data:  Chemistry Recent Labs  Lab 09/09/19 2344  NA 139  K 3.4*  CL 100  CO2 28  GLUCOSE 106*  BUN 14  CREATININE 0.72  CALCIUM 9.2  GFRNONAA >60  GFRAA >60  ANIONGAP 11    No results for input(s): PROT, ALBUMIN, AST, ALT, ALKPHOS, BILITOT in the last 168 hours. Hematology Recent Labs  Lab 09/09/19 2344  WBC 9.2  RBC 4.50  HGB 13.3  HCT 41.5  MCV 92.2  MCH 29.6  MCHC 32.0  RDW 14.6  PLT 245   Cardiac EnzymesNo results for input(s): TROPONINI in the  last 168 hours. No results for input(s): TROPIPOC in the last 168 hours.  BNP Recent Labs  Lab 09/09/19 2344  BNP 22.0    DDimer No results for input(s): DDIMER in the last 168 hours.  Radiology/Studies:  DG Chest Portable 1 View  Result Date: 09/09/2019 CLINICAL DATA:  Defibrillator fired, left-sided chest pain EXAM: PORTABLE CHEST 1 VIEW COMPARISON:  04/08/2018 FINDINGS: Single frontal view of the chest demonstrates stable defibrillator lead projecting over the right ventricle. Cardiac silhouette is unremarkable. No airspace disease, effusion, or pneumothorax. IMPRESSION: 1. No acute intrathoracic process. Electronically Signed   By: Randa Ngo M.D.   On: 09/09/2019 23:41     Assessment & Plan    1. Atypical Chest Pain - She describes 3 episodes of shooting chest pain which lasted for seconds and felt like an electrical shock. Symptoms occurred at rest and were not associated with exertion.  - Her ICD was interrogated in the ED and by review of notes did not show any significant arrhythmias and her device did not fire.  - HS Troponin values have been negative and EKG shows no acute ischemic changes. NST in 2012, 2017 and 12/2017 showed no evidence of ischemia. Given her atypical symptoms, would not anticipate further ischemic evaluation at this time. She does have occasional PVC's but no significant arrhythmias by review of telemetry. She did have an echocardiogram recently in 06/2019 and EF had improved to 45-50% at that time. Would not plan for a repeat study this admission given recent evaluation.   2. Chronic Systolic CHF/NICM - EF was previously 20% in 2012 with low-risk NST --> St. Jude ICD implantation by Dr. Ashley Mariner 09/2011, NST in 12/2017 showing evidence of prior infarct without ischemia. EF was improved to 45-50% by echo in 06/2019.  - She does have lower extremity edema on examination but has been sitting in a chair with her feet down for several hours. No recent  orthopnea or PND.  - Continue current medication regimen with Coreg 25 mg twice daily, Lasix 40 mg twice daily, and Entresto 24-26mg  BID.   3. HTN - BP has been well-controlled at 133/76 - 139/83 while in the ED. Continue current medication regimen.   4. History of CVA - Followed by Neurology as an outpatient. She remains on ASA,  Plavix and statin therapy.    For questions or updates, please contact [place] Please consult [email or web address] for contact info under Cardiology/STEMI.  Signed, Erma Heritage, PA-C 09/10/2019, 10:21 AM Pager: [phone]  Patient seen and discussed with PA Ahmed Prima, I agree with her documentation. 59 yo female history of chornic sysotlic HF LVEF 62-19%, AICD, HTN, HL, obesity, admitted with chest pain.She reports shocking like feeling left chest x 3, thought her AICD was going off and came to hospital. No other significant symptoms. AICD was interrogated and apparently did not have any abnormal arrhythmias and no shocks were delivered. No recurrent chest pain.    WBC 9.2 Hgb 13.3 Plt 245 K 3.4 BUN 14 BNP 22 Mg 2.2  CXR no acute process hstrop 5-->5-->4 EKG SR, LAFB  12/2017 nuclear stress: prior infarct, no current ischemia. 06/2019 echo  LVEF 45-50%, grade I DDx  Atypical chest pains not consistent with ischemia, ekg and enzymes are benign. AICD interrogation in the ER is benign. No plans for any additional cardiac testing, ok for discharge from cardiac standpoint, we will arrange outpatient follow up   Carlyle Dolly MD

## 2019-09-10 NOTE — H&P (Signed)
History and Physical    Melanie Cordova NTZ:[id number] DOB: 1960-08-18 DOA: 09/09/2019  PCP: Rosita Fire, MD  Patient coming from: Home  I have personally briefly reviewed patient's old medical records in [place]  Chief Complaint: Chest discomfort  HPI: Melanie Cordova is a 59 y.o. female with medical history significant of chronic systolic congestive heart failure with ejection fraction 45 to 50%, hypertension, hyperlipidemia, obesity class III, status post AICD, presents to the emergency room after experiencing chest discomfort.  Patient reports that he was in her usual state of health yesterday when she was sitting watching TV and felt as though she had shocks to her left chest.  This was followed by soreness in her left chest.  She did not have any associated shortness of breath, nausea, diaphoresis.  Soreness is located to left chest and is nonradiating.  She is not had any fever or cough.  No vomiting or diarrhea.  ED Course: Vitals are noted to be stable in emergency room.  EKG did not show any acute changes.  Cardiac enzymes were negative.  AICD was interrogated and apparently did not have any abnormal arrhythmias and no shocks were delivered.  Due to her significant cardiac history, she is referred for observation.  Review of Systems: As per HPI otherwise 10 point review of systems negative.    Past Medical History:  Diagnosis Date  . Anxiety   . Arthritis   . Asthma   . Automatic implantable cardioverter-defibrillator in situ    2013, july  . Bell palsy    states has had 3 episodes  . Cardiomyopathy 08/2010   Presented with congestive heart failure; EF of 15% and 2012; hypotension on medication precludes optimal dosing  . CHF (congestive heart failure) ([place])    a. EF 20% in 2012 with low-risk NST --> St. Jude ICD implantation by Dr. Lovena Le in 09/2011  . Chronic systolic heart failure ([place])   . COPD (chronic obstructive pulmonary disease) ([place])    2013  .  Dysrhythmia   . Gastroesophageal reflux disease   . Headache(784.0)   . Hot flashes 10/04/2016  . Hyperlipidemia   . Hypertension    09/2010-normal CMet and CBC; Lipid profile-116, 88, 25, 73  . Hypothyroidism    Recent TSH was normal.  . 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 Neuromuscular disorder (HCC)    Neuropathy, right foot and leg  . Neuropathy   . Obesity   . Pneumonia   . Seizures ([place])    last one at age 17  . Shortness of breath   . Stroke [facility])     stroke in 07/2012 and another in June, 2014  . Tobacco abuse    20 pack years    Past Surgical History:  Procedure Laterality Date  . CESAREAN SECTION     X2  . COLONOSCOPY N/A 12/16/2013   One simple adenoma and 3 hyperplastic polyps. Small internal hemorrhoids. Left colon redundant. Due for surveillance 2020.   Marland Kitchen COLONOSCOPY WITH PROPOFOL N/A 06/04/2019   Procedure: COLONOSCOPY WITH PROPOFOL;  Surgeon: Danie Binder, MD;  [facility];  Service: Endoscopy;  Laterality: N/A;  10:30am  . EP IMPLANTABLE DEVICE     St. Jude  . EP IMPLANTABLE DEVICE N/A 08/13/2015   Procedure: Loop Recorder Insertion;  Surgeon: Evans Lance, MD;  Location: [facility] CV LAB;  Service: Cardiovascular;  Laterality: N/A;  . IMPLANTABLE CARDIOVERTER DEFIBRILLATOR IMPLANT N/A 10/10/2011  Procedure: IMPLANTABLE CARDIOVERTER DEFIBRILLATOR IMPLANT;  Surgeon: Evans Lance, MD;  Location: [facility] CATH LAB;  Service: Cardiovascular;  Laterality: N/A;  . MULTIPLE EXTRACTIONS WITH ALVEOLOPLASTY N/A 09/24/2012   Procedure: MULTIPLE EXTRACION #2, 4, 6, 7 ,8, 9, 11, 13, 18, 20, 21, 22, 23, 24, 25, 26, 27, 29 WITH ALVEOLOPLASTY, BIOPSY OF PALATE LESION, REMOVA RIGHT LINGUAL TORUS;  Surgeon: Gae Bon, DDS;  Location: [place];  Service: Oral Surgery;  Laterality: N/A;  . POLYPECTOMY  06/04/2019   Procedure: POLYPECTOMY;  Surgeon: Danie Binder, MD;  [facility];  Service: Endoscopy;;  . TEE WITHOUT CARDIOVERSION  N/A 07/11/2012   Procedure: TRANSESOPHAGEAL ECHOCARDIOGRAM (TEE);  Surgeon: Thayer Headings, MD;  Location: [place];  Service: Cardiovascular;  Laterality: N/A;  . TUBAL LIGATION      Social History:  reports that she has been smoking cigarettes. She started smoking about 36 years ago. She has a 10.50 pack-year smoking history. She has never used smokeless tobacco. She reports that she does not drink alcohol and does not use drugs.  Allergies  Allergen Reactions  . Fish Allergy Anaphylaxis  . Iodinated Diagnostic Agents Hives and Other (See Comments)    Pulmonary problems; no frank respiratory arrest  . Iodine Hives and Other (See Comments)    Pulmonary Problems   . [place] Anaphylaxis  . Penicillins Anaphylaxis and Shortness Of Breath    Has patient had a PCN reaction causing immediate rash, facial/tongue/throat swelling, SOB or lightheadedness with hypotension: Yes Has patient had a PCN reaction causing severe rash involving mucus membranes or skin necrosis: No Has patient had a PCN reaction that required hospitalization No Has patient had a PCN reaction occurring within the last 10 years: No If all of the above answers are "NO", then may proceed with Cephalosporin use.  Hair loss  . Lipitor [Atorvastatin] Itching and Rash  . Spironolactone Rash  . Sulfa Antibiotics Other (See Comments)    Unknown    Family History  Problem Relation Age of Onset  . Cardiomyopathy Mother        ICD pacemaker-ischmic CM  . Heart failure Mother   . Hypertension Mother   . Diabetes Mother   . Thyroid disease Mother   . Cardiomyopathy Father        Deceased  . Coronary artery disease Father   . Heart failure Father   . Heart failure Brother   . Hypertension Brother   . Endometriosis Sister   . Heart disease Paternal Grandfather   . Heart disease Paternal Grandmother   . Heart disease Maternal Grandmother   . Heart disease Maternal Grandfather   . Hypertension Daughter   . Obesity  Daughter   . Colon cancer Neg Hx   . Liver disease Neg Hx      Prior to Admission medications   Medication Sig Start Date End Date Taking? Authorizing Provider  albuterol (PROVENTIL HFA;VENTOLIN HFA) 108 (90 BASE) MCG/ACT inhaler Inhale 2 puffs into the lungs every 6 (six) hours as needed for wheezing or shortness of breath. 07/12/12  Yes Ziemer, Kelli Hope, MD  aspirin EC 81 MG tablet Take 1 tablet (81 mg total) by mouth daily. 07/03/19  Yes Donzetta Starch, NP  Biotin 10000 MCG TABS Take 10,000 mcg by mouth daily.    Yes [provider]  budesonide-formoterol (SYMBICORT) 80-4.5 MCG/ACT inhaler Inhale 2 puffs into the lungs 2 (two) times daily.   Yes [provider]  carvedilol (COREG) 12.5 MG  tablet Take 25 mg by mouth 2 (two) times daily with a meal.   Yes [provider]  cetirizine (ZYRTEC) 10 MG tablet Take 10 mg by mouth daily.    [provider]  Cholecalciferol (VITAMIN D3) 5000 units CAPS TAKE ONE CAPSULE BY MOUTH DAILY 09/22/17   [name], MD  clobetasol (TEMOVATE) 0.05 % external solution Apply 1 application topically 2 (two) times daily as needed (Apply to scalp.).  07/24/15   [provider]  clopidogrel (PLAVIX) 75 MG tablet Take 1 tablet (75 mg total) by mouth daily. 07/03/19   Rosalin Hawking, MD  diclofenac sodium (VOLTAREN) 1 % GEL Apply 2 g topically 4 (four) times daily as needed (pain).     [provider]  diphenhydrAMINE (BENADRYL) 25 MG tablet Take 25 mg by mouth in the morning and at bedtime.     [provider]  ENTRESTO 24-26 MG TAKE ONE TABLET BY MOUTH 2 TIMES A DAY Patient taking differently: Take 1 tablet by mouth 2 (two) times daily.  12/31/18   Evans Lance, MD  famotidine (PEPCID) 20 MG tablet Take 20 mg by mouth daily as needed for heartburn or indigestion.    [provider]  Ferrous Sulfate (IRON) 28 MG TABS Take 28 mg by mouth daily.     [provider]  fluticasone  (FLONASE) 50 MCG/ACT nasal spray Place 2 sprays into both nostrils daily.     [provider]  furosemide (LASIX) 40 MG tablet Take 1 tablet (40 mg total) by mouth 2 (two) times daily. 08/29/12   Yehuda Savannah, MD  gabapentin (NEURONTIN) 800 MG tablet Take 800 mg by mouth every 6 (six) hours as needed (pain).  10/30/15   [provider]  ketoconazole (NIZORAL) 2 % cream Apply 1 application topically 2 (two) times daily.  10/21/15   [provider]  levothyroxine (SYNTHROID, LEVOTHROID) 200 MCG tablet Take 1 tablet (200 mcg total) by mouth daily before breakfast. 03/01/18   Nida, Marella Chimes, MD  Multiple Vitamins-Minerals (CENTRAVITES 50 PLUS PO) Take 1 tablet by mouth daily.     [provider]  pantoprazole (PROTONIX) 20 MG tablet Take 1 tablet (20 mg total) by mouth daily. 06/21/18   Strader, Fransisco Hertz, PA-C  potassium chloride SA (K-DUR) 20 MEQ tablet Take 20 mEq by mouth daily.  05/23/18   [provider]  psyllium (METAMUCIL) 58.6 % packet Take 1 packet by mouth daily.    [provider]  rosuvastatin (CRESTOR) 20 MG tablet Take 20 mg by mouth daily.    [provider]  Simethicone (GAS-X PO) Take 1 tablet by mouth in the morning and at bedtime.     [provider]  topiramate (TOPAMAX) 25 MG tablet Take one tablet at night for one week, then take 2 tablets at night for one week, then take 3 tablets at night. 09/02/19   Kathrynn Ducking, MD  Vitamin D, Ergocalciferol, (DRISDOL) 1.25 MG (50000 UNIT) CAPS capsule Take 50,000 Units by mouth every 30 (thirty) days.    [provider]    Physical Exam: Vitals:   09/10/19 4193 09/10/19 0640 09/10/19 0641 09/10/19 0642  BP:      Pulse:      Resp: 15 16 17 14   Temp:      TempSrc:      SpO2:      Weight:      Height:        Constitutional:  NAD, calm, comfortable Eyes: PERRL, lids and conjunctivae normal ENMT: Mucous membranes are moist. Posterior pharynx  clear of any exudate or lesions.Normal dentition.  Neck: normal, supple, no masses, no thyromegaly Respiratory: clear to auscultation bilaterally, no wheezing, no crackles. Normal respiratory effort. No accessory muscle use.  Cardiovascular: Regular rate and rhythm, no murmurs / rubs / gallops.  Mild tenderness to palpation over left chest.  2+ pedal pulses. No carotid bruits.  Abdomen: no tenderness, no masses palpated. No hepatosplenomegaly. Bowel sounds positive.  Musculoskeletal: no clubbing / cyanosis. No joint deformity upper and lower extremities. Good ROM, no contractures. Normal muscle tone.  Skin: no rashes, lesions, ulcers. No induration Neurologic: CN 2-12 grossly intact. Sensation intact, DTR normal. Strength 5/5 in all 4.  Psychiatric: Normal judgment and insight. Alert and oriented x 3. Normal mood.    Labs on Admission: I have personally reviewed following labs and imaging studies  CBC: Recent Labs  Lab 09/09/19 2344  WBC 9.2  NEUTROABS 5.3  HGB 13.3  HCT 41.5  MCV 92.2  PLT 035   Basic Metabolic Panel: Recent Labs  Lab 09/09/19 2344  NA 139  K 3.4*  CL 100  CO2 28  GLUCOSE 106*  BUN 14  CREATININE 0.72  CALCIUM 9.2  MG 2.2   GFR: Estimated Creatinine Clearance: 110.5 mL/min (by C-G formula based on SCr of 0.72 mg/dL). Liver Function Tests: No results for input(s): AST, ALT, ALKPHOS, BILITOT, PROT, ALBUMIN in the last 168 hours. No results for input(s): LIPASE, AMYLASE in the last 168 hours. No results for input(s): AMMONIA in the last 168 hours. Coagulation Profile: No results for input(s): INR, PROTIME in the last 168 hours. Cardiac Enzymes: No results for input(s): CKTOTAL, CKMB, CKMBINDEX, TROPONINI in the last 168 hours. BNP (last 3 results) No results for input(s): PROBNP in the last 8760 hours. HbA1C: No results for input(s): HGBA1C in the last 72 hours. CBG: No results for input(s): GLUCAP in the last 168 hours. Lipid Profile: No results  for input(s): CHOL, HDL, LDLCALC, TRIG, CHOLHDL, LDLDIRECT in the last 72 hours. Thyroid Function Tests: No results for input(s): TSH, T4TOTAL, FREET4, T3FREE, THYROIDAB in the last 72 hours. Anemia Panel: No results for input(s): VITAMINB12, FOLATE, FERRITIN, TIBC, IRON, RETICCTPCT in the last 72 hours. Urine analysis:    Component Value Date/Time   COLORURINE YELLOW 07/02/2019 1717   APPEARANCEUR HAZY (A) 07/02/2019 1717   LABSPEC 1.006 07/02/2019 1717   PHURINE 7.0 07/02/2019 1717   GLUCOSEU NEGATIVE 07/02/2019 1717   HGBUR SMALL (A) 07/02/2019 1717   BILIRUBINUR NEGATIVE 07/02/2019 1717   KETONESUR NEGATIVE 07/02/2019 1717   PROTEINUR NEGATIVE 07/02/2019 1717   UROBILINOGEN 0.2 04/29/2013 1702   NITRITE NEGATIVE 07/02/2019 1717   LEUKOCYTESUR MODERATE (A) 07/02/2019 1717    Radiological Exams on Admission: DG Chest Portable 1 View  Result Date: 09/09/2019 CLINICAL DATA:  Defibrillator fired, left-sided chest pain EXAM: PORTABLE CHEST 1 VIEW COMPARISON:  04/08/2018 FINDINGS: Single frontal view of the chest demonstrates stable defibrillator lead projecting over the right ventricle. Cardiac silhouette is unremarkable. No airspace disease, effusion, or pneumothorax. IMPRESSION: 1. No acute intrathoracic process. Electronically Signed   By: Randa Ngo M.D.   On: 09/09/2019 23:41    EKG: Independently reviewed.  Sinus rhythm without acute changes  Assessment/Plan Active Problems:   Hypertension   Cardiomyopathy ([place])   Hypothyroidism   Automatic implantable cardioverter-defibrillator in situ   Obesity, Class III, BMI 40-49.9 (morbid obesity) ([place])   Chest  pain     1. Chest pain.  Unclear etiology.  Device interrogation indicates that her AICD did not fire.  Cardiac enzymes are thus far negative.  She still has some chest discomfort.  Will request cardiology input.  She normally sees Dr. Lovena Le. 2. Hypothyroidism.  Continue on Synthroid 3. Chronic systolic congestive  heart failure.  Volume status appears to be at baseline.  Continue home dose of Entresto, Coreg, Lasix. 4. Hypothyroidism.  Continue on Synthroid 5. Obesity, class III.  Patient is working towards weight loss with diet and exercise.  DVT prophylaxis: Lovenox Code Status: Full code Family Communication: Discussed with patient Disposition Plan: Discharge home once cardiac work-up is completed Consults called: Cardiology Admission status: Observation, telemetry  Kathie Dike MD [facility]   If 7PM-7AM, please contact night-coverage [email or web address]   09/10/2019, 9:20 AM

## 2019-09-10 NOTE — Discharge Instructions (Signed)

## 2019-09-11 NOTE — Progress Notes (Signed)
Physician Discharge Summary  Melanie Cordova DJS:[id number] DOB: 1960/06/23 DOA: 09/09/2019  PCP: Rosita Fire, MD  Admit date: 09/09/2019 Discharge date: 09/10/2019  Admitted From: Home Disposition: Home  Recommendations for Outpatient Follow-up:  1. Follow up with PCP in 1-2 weeks 2. Please obtain BMP/CBC in one week 3. Outpatient follow-up with cardiology has been scheduled.  Home Health: Equipment/Devices:  Discharge Condition: Stable CODE STATUS: Full code Diet recommendation: Heart healthy  Brief/Interim Summary: 59 year old female with a history of chronic systolic congestive heart failure with ejection fraction of 45 to 50%, hypertension, hyperlipidemia, obesity, status post AICD, presented to the emergency room with complaints of chest pain.  Patient was watching TV when she felt as though she had shocks to her left chest.  This was followed by soreness to her left chest.  She came to the ER for evaluation where her AICD was interrogated and it showed that she did not have any arrhythmias and no shocks were delivered.  She had negative cardiac enzymes.  EKG was nonacute.  She did not have any recurrence of symptoms since coming to the emergency room.  She was seen by cardiology who did not feel she needed any further work-up in the hospital.  She was felt safe for discharge home.  She will be followed up with cardiology as an outpatient.  Discharge Diagnoses:  Active Problems:   Hypertension   Cardiomyopathy ([place])   Hypothyroidism   Automatic implantable cardioverter-defibrillator in situ   Obesity, Class III, BMI 40-49.9 (morbid obesity) ([place])   Chest pain    Discharge Instructions  Discharge Instructions    Diet - low sodium heart healthy   Complete by: As directed    Increase activity slowly   Complete by: As directed      Allergies as of 09/10/2019      Reactions   Fish Allergy Anaphylaxis   Iodinated Diagnostic Agents Hives, Other (See Comments)    Pulmonary problems; no frank respiratory arrest   Iodine Hives, Other (See Comments)   Pulmonary Problems    Lentil Anaphylaxis   Penicillins Anaphylaxis, Shortness Of Breath   Has patient had a PCN reaction causing immediate rash, facial/tongue/throat swelling, SOB or lightheadedness with hypotension: Yes Has patient had a PCN reaction causing severe rash involving mucus membranes or skin necrosis: No Has patient had a PCN reaction that required hospitalization No Has patient had a PCN reaction occurring within the last 10 years: No If all of the above answers are "NO", then may proceed with Cephalosporin use. Hair loss   Lipitor [atorvastatin] Itching, Rash   Spironolactone Rash   Sulfa Antibiotics Other (See Comments)   Unknown      Medication List    TAKE these medications   albuterol 108 (90 Base) MCG/ACT inhaler Commonly known as: VENTOLIN HFA Inhale 2 puffs into the lungs every 6 (six) hours as needed for wheezing or shortness of breath.   aspirin EC 81 MG tablet Take 1 tablet (81 mg total) by mouth daily.   Biotin 10000 MCG Tabs Take 10,000 mcg by mouth daily.   budesonide-formoterol 80-4.5 MCG/ACT inhaler Commonly known as: SYMBICORT Inhale 2 puffs into the lungs 2 (two) times daily.   carvedilol 12.5 MG tablet Commonly known as: COREG Take 25 mg by mouth 2 (two) times daily with a meal.   CENTRAVITES 50 PLUS PO Take 1 tablet by mouth daily.   cetirizine 10 MG tablet Commonly known as: ZYRTEC Take 10 mg by mouth daily.  clobetasol 0.05 % external solution Commonly known as: TEMOVATE Apply 1 application topically 2 (two) times daily as needed (Apply to scalp.).   clopidogrel 75 MG tablet Commonly known as: PLAVIX Take 1 tablet (75 mg total) by mouth daily.   diclofenac sodium 1 % Gel Commonly known as: VOLTAREN Apply 2 g topically 4 (four) times daily as needed (pain).   diphenhydrAMINE 25 MG tablet Commonly known as: BENADRYL Take 25 mg by mouth in  the morning and at bedtime.   Entresto 24-26 MG Generic drug: sacubitril-valsartan TAKE ONE TABLET BY MOUTH 2 TIMES A DAY What changed: See the new instructions.   famotidine 20 MG tablet Commonly known as: PEPCID Take 20 mg by mouth daily as needed for heartburn or indigestion.   fluticasone 50 MCG/ACT nasal spray Commonly known as: FLONASE Place 2 sprays into both nostrils daily.   furosemide 40 MG tablet Commonly known as: LASIX Take 1 tablet (40 mg total) by mouth 2 (two) times daily.   gabapentin 800 MG tablet Commonly known as: NEURONTIN Take 800 mg by mouth every 6 (six) hours as needed (pain).   GAS-X PO Take 1 tablet by mouth in the morning and at bedtime.   Iron 28 MG Tabs Take 28 mg by mouth daily.   ketoconazole 2 % cream Commonly known as: NIZORAL Apply 1 application topically 2 (two) times daily.   levothyroxine 200 MCG tablet Commonly known as: SYNTHROID Take 1 tablet (200 mcg total) by mouth daily before breakfast.   pantoprazole 20 MG tablet Commonly known as: PROTONIX Take 1 tablet (20 mg total) by mouth daily.   potassium chloride SA 20 MEQ tablet Commonly known as: KLOR-CON Take 20 mEq by mouth daily.   psyllium 58.6 % packet Commonly known as: METAMUCIL Take 1 packet by mouth daily.   rosuvastatin 20 MG tablet Commonly known as: CRESTOR Take 20 mg by mouth daily.   topiramate 25 MG tablet Commonly known as: TOPAMAX Take one tablet at night for one week, then take 2 tablets at night for one week, then take 3 tablets at night.   Vitamin D (Ergocalciferol) 1.25 MG (50000 UNIT) Caps capsule Commonly known as: DRISDOL Take 50,000 Units by mouth every 30 (thirty) days.   Vitamin D3 125 MCG (5000 UT) Caps TAKE ONE CAPSULE BY MOUTH DAILY       Follow-up Information    Erma Heritage, PA-C Follow up on 09/27/2019.   Specialties: Physician Assistant, Cardiology Why: Cardiology Hospital Follow-up on 09/27/2019 at 3:30 PM.  Contact  information: [place] [address] [phone]              Allergies  Allergen Reactions   Fish Allergy Anaphylaxis   Iodinated Diagnostic Agents Hives and Other (See Comments)    Pulmonary problems; no frank respiratory arrest   Iodine Hives and Other (See Comments)    Pulmonary Problems    Lentil Anaphylaxis   Penicillins Anaphylaxis and Shortness Of Breath    Has patient had a PCN reaction causing immediate rash, facial/tongue/throat swelling, SOB or lightheadedness with hypotension: Yes Has patient had a PCN reaction causing severe rash involving mucus membranes or skin necrosis: No Has patient had a PCN reaction that required hospitalization No Has patient had a PCN reaction occurring within the last 10 years: No If all of the above answers are "NO", then may proceed with Cephalosporin use.  Hair loss   Lipitor [Atorvastatin] Itching and Rash   Spironolactone Rash  Sulfa Antibiotics Other (See Comments)    Unknown    Consultations:  Cardiology   Procedures/Studies: DG Chest Portable 1 View  Result Date: 09/09/2019 CLINICAL DATA:  Defibrillator fired, left-sided chest pain EXAM: PORTABLE CHEST 1 VIEW COMPARISON:  04/08/2018 FINDINGS: Single frontal view of the chest demonstrates stable defibrillator lead projecting over the right ventricle. Cardiac silhouette is unremarkable. No airspace disease, effusion, or pneumothorax. IMPRESSION: 1. No acute intrathoracic process. Electronically Signed   By: Randa Ngo M.D.   On: 09/09/2019 23:41       Subjective: No recurrence of chest pain.  No shortness of breath.  Discharge Exam: Vitals:   09/10/19 0641 09/10/19 0642 09/10/19 1140 09/10/19 1311  BP:   118/77 120/82  Pulse:   74 72  Resp: 17 14 16 18   Temp:   98 F (36.7 C) 98 F (36.7 C)  TempSrc:   Oral Oral  SpO2:   97% 98%  Weight:      Height:        General: Pt is alert, awake, not in acute distress Cardiovascular: RRR,  S1/S2 +, no rubs, no gallops Respiratory: CTA bilaterally, no wheezing, no rhonchi Abdominal: Soft, NT, ND, bowel sounds + Extremities: no edema, no cyanosis    The results of significant diagnostics from this hospitalization (including imaging, microbiology, ancillary and laboratory) are listed below for reference.     Microbiology: Recent Results (from the past 240 hour(s))  SARS Coronavirus 2 by RT PCR (hospital order, performed in [facility] hospital lab) Nasopharyngeal Nasopharyngeal Swab     Status: None   Collection Time: 09/10/19  1:56 AM   Specimen: Nasopharyngeal Swab  Result Value Ref Range Status   SARS Coronavirus 2 NEGATIVE NEGATIVE Final    Comment: (NOTE) SARS-CoV-2 target nucleic acids are NOT DETECTED.  The SARS-CoV-2 RNA is generally detectable in upper and lower respiratory specimens during the acute phase of infection. The lowest concentration of SARS-CoV-2 viral copies this assay can detect is 250 copies / mL. A negative result does not preclude SARS-CoV-2 infection and should not be used as the sole basis for treatment or other patient management decisions.  A negative result may occur with improper specimen collection / handling, submission of specimen other than nasopharyngeal swab, presence of viral mutation(s) within the areas targeted by this assay, and inadequate number of viral copies (<250 copies / mL). A negative result must be combined with clinical observations, patient history, and epidemiological information.  Fact Sheet for Patients:   StrictlyIdeas.no  Fact Sheet for Healthcare Providers: BankingDealers.co.za  This test is not yet approved or  cleared by the [place] FDA and has been authorized for detection and/or diagnosis of SARS-CoV-2 by FDA under an Emergency Use Authorization (EUA).  This EUA will remain in effect (meaning this test can be used) for the duration of the COVID-19  declaration under Section 564(b)(1) of the Act, 21 U.S.C. section 360bbb-3(b)(1), unless the authorization is terminated or revoked sooner.  Performed at [facility], [address], [place], [place] [address]      Labs: BNP (last 3 results) Recent Labs    09/09/19 2344  BNP 05.3   Basic Metabolic Panel: Recent Labs  Lab 09/09/19 2344  NA 139  K 3.4*  CL 100  CO2 28  GLUCOSE 106*  BUN 14  CREATININE 0.72  CALCIUM 9.2  MG 2.2   Liver Function Tests: No results for input(s): AST, ALT, ALKPHOS, BILITOT, PROT, ALBUMIN in the last 168  hours. No results for input(s): LIPASE, AMYLASE in the last 168 hours. No results for input(s): AMMONIA in the last 168 hours. CBC: Recent Labs  Lab 09/09/19 2344  WBC 9.2  NEUTROABS 5.3  HGB 13.3  HCT 41.5  MCV 92.2  PLT 245   Cardiac Enzymes: No results for input(s): CKTOTAL, CKMB, CKMBINDEX, TROPONINI in the last 168 hours. BNP: Invalid input(s): POCBNP CBG: No results for input(s): GLUCAP in the last 168 hours. D-Dimer No results for input(s): DDIMER in the last 72 hours. Hgb A1c No results for input(s): HGBA1C in the last 72 hours. Lipid Profile No results for input(s): CHOL, HDL, LDLCALC, TRIG, CHOLHDL, LDLDIRECT in the last 72 hours. Thyroid function studies No results for input(s): TSH, T4TOTAL, T3FREE, THYROIDAB in the last 72 hours.  Invalid input(s): FREET3 Anemia work up No results for input(s): VITAMINB12, FOLATE, FERRITIN, TIBC, IRON, RETICCTPCT in the last 72 hours. Urinalysis    Component Value Date/Time   COLORURINE YELLOW 07/02/2019 1717   APPEARANCEUR HAZY (A) 07/02/2019 1717   LABSPEC 1.006 07/02/2019 1717   PHURINE 7.0 07/02/2019 1717   GLUCOSEU NEGATIVE 07/02/2019 1717   HGBUR SMALL (A) 07/02/2019 1717   BILIRUBINUR NEGATIVE 07/02/2019 1717   KETONESUR NEGATIVE 07/02/2019 1717   PROTEINUR NEGATIVE 07/02/2019 1717   UROBILINOGEN 0.2 04/29/2013 1702   NITRITE NEGATIVE 07/02/2019 1717    LEUKOCYTESUR MODERATE (A) 07/02/2019 1717   Sepsis Labs Invalid input(s): PROCALCITONIN,  WBC,  LACTICIDVEN Microbiology Recent Results (from the past 240 hour(s))  SARS Coronavirus 2 by RT PCR (hospital order, performed in [facility] hospital lab) Nasopharyngeal Nasopharyngeal Swab     Status: None   Collection Time: 09/10/19  1:56 AM   Specimen: Nasopharyngeal Swab  Result Value Ref Range Status   SARS Coronavirus 2 NEGATIVE NEGATIVE Final    Comment: (NOTE) SARS-CoV-2 target nucleic acids are NOT DETECTED.  The SARS-CoV-2 RNA is generally detectable in upper and lower respiratory specimens during the acute phase of infection. The lowest concentration of SARS-CoV-2 viral copies this assay can detect is 250 copies / mL. A negative result does not preclude SARS-CoV-2 infection and should not be used as the sole basis for treatment or other patient management decisions.  A negative result may occur with improper specimen collection / handling, submission of specimen other than nasopharyngeal swab, presence of viral mutation(s) within the areas targeted by this assay, and inadequate number of viral copies (<250 copies / mL). A negative result must be combined with clinical observations, patient history, and epidemiological information.  Fact Sheet for Patients:   StrictlyIdeas.no  Fact Sheet for Healthcare Providers: BankingDealers.co.za  This test is not yet approved or  cleared by the [place] FDA and has been authorized for detection and/or diagnosis of SARS-CoV-2 by FDA under an Emergency Use Authorization (EUA).  This EUA will remain in effect (meaning this test can be used) for the duration of the COVID-19 declaration under Section 564(b)(1) of the Act, 21 U.S.C. section 360bbb-3(b)(1), unless the authorization is terminated or revoked sooner.  Performed at [facility], [address], [place], [place] [address]       Time coordinating discharge: 74mins  SIGNED:   Kathie Dike, MD  [facility] 09/11/2019, 2:02 PM   If 7PM-7AM, please contact night-coverage [email or web address]

## 2019-09-24 ENCOUNTER — Ambulatory Visit (INDEPENDENT_AMBULATORY_CARE_PROVIDER_SITE_OTHER): Payer: Medicare Other | Admitting: *Deleted

## 2019-09-24 DIAGNOSIS — I255 Ischemic cardiomyopathy: Secondary | ICD-10-CM

## 2019-09-27 ENCOUNTER — Ambulatory Visit: Payer: Medicare Other | Admitting: Student

## 2019-09-29 LAB — CUP PACEART REMOTE DEVICE CHECK
Battery Remaining Longevity: 19 mo
Battery Remaining Percentage: 18 %
Battery Voltage: 2.75 V
Brady Statistic RV Percent Paced: 1 %
Date Time Interrogation Session: 20210717071006
HighPow Impedance: 82 Ohm
HighPow Impedance: 82 Ohm
Implantable Lead Implant Date: 20130729
Implantable Lead Location: 753860
Implantable Lead Model: 181
Implantable Lead Serial Number: 322336
Implantable Pulse Generator Implant Date: 20130729
Lead Channel Impedance Value: 650 Ohm
Lead Channel Pacing Threshold Amplitude: 0.75 V
Lead Channel Pacing Threshold Pulse Width: 0.5 ms
Lead Channel Sensing Intrinsic Amplitude: 12 mV
Lead Channel Setting Pacing Amplitude: 2.5 V
Lead Channel Setting Pacing Pulse Width: 0.5 ms
Lead Channel Setting Sensing Sensitivity: 0.5 mV
Pulse Gen Serial Number: 1022442

## 2019-10-01 NOTE — Progress Notes (Signed)
Remote ICD transmission.   

## 2019-10-08 ENCOUNTER — Other Ambulatory Visit: Payer: Self-pay

## 2019-10-08 NOTE — Patient Outreach (Signed)
[facility]) Care Management  10/08/2019  DEVA RON 05/15/1960 [id number]  First telephone outreach attempt to obtain mRS. No answer. Unable leave a message for a returned call.  Ina Homes [facility] Management Assistant [phone]

## 2019-10-09 ENCOUNTER — Other Ambulatory Visit: Payer: Self-pay | Admitting: Internal Medicine

## 2019-10-09 DIAGNOSIS — M79605 Pain in left leg: Secondary | ICD-10-CM

## 2019-10-11 ENCOUNTER — Other Ambulatory Visit: Payer: Self-pay

## 2019-10-11 NOTE — Patient Outreach (Signed)
[facility]) Care Management  10/11/2019  Melanie Cordova 12/24/1960 [id number]  Second telephone outreach attempt to obtain mRS. No answer. Left message for returned call.  Ina Homes [facility] Management Assistant [phone]

## 2019-10-15 ENCOUNTER — Other Ambulatory Visit: Payer: Self-pay

## 2019-10-15 NOTE — Patient Outreach (Signed)
[facility]) Care Management  10/15/2019  SHALONDRA WUNSCHEL 1960/11/15 [id number]  3 outreach attempts were completed to obtain mRs. mRs could not be obtained because patient never returned my calls. mRs=7    [facility] Management Assistant [phone]

## 2019-10-16 ENCOUNTER — Other Ambulatory Visit: Payer: Self-pay

## 2019-10-16 ENCOUNTER — Ambulatory Visit (HOSPITAL_COMMUNITY)
Admission: RE | Admit: 2019-10-16 | Discharge: 2019-10-16 | Disposition: A | Discharge status: Home / Self Care | Payer: Medicare Other | Source: Ambulatory Visit | Attending: Internal Medicine | Admitting: Internal Medicine

## 2019-10-16 DIAGNOSIS — M79605 Pain in left leg: Secondary | ICD-10-CM | POA: Diagnosis present

## 2019-10-16 DIAGNOSIS — M79604 Pain in right leg: Secondary | ICD-10-CM | POA: Insufficient documentation

## 2019-10-22 NOTE — Progress Notes (Signed)
Cardiology Office Note    Date:  10/23/2019   ID:  HALY FEHER, DOB April 05, 1960, MRN [id number]  PCP:  Rosita Fire, MD  Cardiologist: [name], MD    Chief Complaint  Patient presents with  . Hospitalization Follow-up    History of Present Illness:    [place] Wilbourne is a 59 y.o. female with past medical history of presumed nonischemic cardiomyopathy (EF 20% in 2012 with low-risk NST --> St. Jude ICD implantation by Dr. Ashley Mariner 09/2011, NST in 12/2017 showing evidence of prior infarct without ischemia), chronic combined systolic and diastolic CHF (EF 53-29% by echo in 2015, at 35-40% by repeat imaging in 12/2017, EF 45-50% by echo in 06/2019), HTN, HLD, COPD, tobacco use and prior CVA (ILR in place) who presents to the office today for hospital follow-up.  She most recently presented to [facility] ED on 09/09/2019 for evaluation of what felt like her defibrillator having fired over 3 times. Her device was interrogated while in the ED and showed no evidence of VT or VF and no shocks had been delivered. Her HS troponin values were negative and EKG showed no acute ischemic changes. Given her atypical symptoms and having ruled out for ACS, further cardiac testing was not pursued at that time. A follow-up visit was initially scheduled for 09/27/2019 but the patient canceled that appointment and was rescheduled for today.   In talking the patient today, she reports being under increased stress since the time of her last visit as her daughter and granddaughter are currently staying with her in her 1 bedroom apartment. She is helping to look after her 71-year-old granddaughter and she has been more active secondary to this as they typically walk to the park and she has been exercising in her apartment. She has lost over 11 pounds since 08/2019. She reports having dyspnea on exertion in the warmer temperatures but denies any progressive symptoms. No recent exertional chest pain or  palpitations. She denies any specific orthopnea or PND. She has been wearing compression stockings to help with her lower extremity edema.   Past Medical History:  Diagnosis Date  . Anxiety   . Arthritis   . Asthma   . Automatic implantable cardioverter-defibrillator in situ    2013, july  . Bell palsy    states has had 3 episodes  . Cardiomyopathy 08/2010   Presented with congestive heart failure; EF of 15% and 2012; hypotension on medication precludes optimal dosing  . CHF (congestive heart failure) ([employer])    a. EF 20% in 2012 with low-risk NST --> St. Jude ICD implantation by Dr. Lovena Le in 09/2011  . Chronic systolic heart failure ([place])   . COPD (chronic obstructive pulmonary disease) ([place])    2013  . Dysrhythmia   . Gastroesophageal reflux disease   . Headache(784.0)   . Hot flashes 10/04/2016  . Hyperlipidemia   . Hypertension    09/2010-normal CMet and CBC; Lipid profile-116, 88, 25, 73  . Hypothyroidism    Recent TSH was normal.  . 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 Neuromuscular disorder (HCC)    Neuropathy, right foot and leg  . Neuropathy   . Obesity   . Pneumonia   . Seizures ([place])    last one at age 17  . Shortness of breath   . Stroke [facility])     stroke in 07/2012 and another in June, 2014  . Tobacco abuse    20 pack years  Past Surgical History:  Procedure Laterality Date  . CESAREAN SECTION     X2  . COLONOSCOPY N/A 12/16/2013   One simple adenoma and 3 hyperplastic polyps. Small internal hemorrhoids. Left colon redundant. Due for surveillance 2020.   Marland Kitchen COLONOSCOPY WITH PROPOFOL N/A 06/04/2019   Procedure: COLONOSCOPY WITH PROPOFOL;  Surgeon: Danie Binder, MD;  [facility];  Service: Endoscopy;  Laterality: N/A;  10:30am  . EP IMPLANTABLE DEVICE     St. Jude  . EP IMPLANTABLE DEVICE N/A 08/13/2015   Procedure: Loop Recorder Insertion;  Surgeon: Evans Lance, MD;  Location: [place] CV LAB;  Service:  Cardiovascular;  Laterality: N/A;  . IMPLANTABLE CARDIOVERTER DEFIBRILLATOR IMPLANT N/A 10/10/2011   Procedure: IMPLANTABLE CARDIOVERTER DEFIBRILLATOR IMPLANT;  Surgeon: Evans Lance, MD;  Location: [facility] CATH LAB;  Service: Cardiovascular;  Laterality: N/A;  . MULTIPLE EXTRACTIONS WITH ALVEOLOPLASTY N/A 09/24/2012   Procedure: MULTIPLE EXTRACION #2, 4, 6, 7 ,8, 9, 11, 13, 18, 20, 21, 22, 23, 24, 25, 26, 27, 29 WITH ALVEOLOPLASTY, BIOPSY OF PALATE LESION, REMOVA RIGHT LINGUAL TORUS;  Surgeon: Gae Bon, DDS;  Location: [place];  Service: Oral Surgery;  Laterality: N/A;  . POLYPECTOMY  06/04/2019   Procedure: POLYPECTOMY;  Surgeon: Danie Binder, MD;  [facility];  Service: Endoscopy;;  . TEE WITHOUT CARDIOVERSION N/A 07/11/2012   Procedure: TRANSESOPHAGEAL ECHOCARDIOGRAM (TEE);  Surgeon: Thayer Headings, MD;  Location: [place];  Service: Cardiovascular;  Laterality: N/A;  . TUBAL LIGATION      Current Medications: Outpatient Medications Prior to Visit  Medication Sig Dispense Refill  . albuterol (PROVENTIL HFA;VENTOLIN HFA) 108 (90 BASE) MCG/ACT inhaler Inhale 2 puffs into the lungs every 6 (six) hours as needed for wheezing or shortness of breath. 1 Inhaler 5  . aspirin EC 81 MG tablet Take 1 tablet (81 mg total) by mouth daily. 21 tablet 0  . Biotin 10000 MCG TABS Take 10,000 mcg by mouth daily.     . budesonide-formoterol (SYMBICORT) 80-4.5 MCG/ACT inhaler Inhale 2 puffs into the lungs 2 (two) times daily.    . carvedilol (COREG) 12.5 MG tablet Take 25 mg by mouth 2 (two) times daily with a meal.    . cetirizine (ZYRTEC) 10 MG tablet Take 10 mg by mouth daily.    . Cholecalciferol (VITAMIN D3) 5000 units CAPS TAKE ONE CAPSULE BY MOUTH DAILY 90 capsule 0  . clobetasol (TEMOVATE) 0.05 % external solution Apply 1 application topically 2 (two) times daily as needed (Apply to scalp.).     Marland Kitchen clopidogrel (PLAVIX) 75 MG tablet Take 1 tablet (75 mg total) by mouth daily. 21 tablet 0   . diclofenac sodium (VOLTAREN) 1 % GEL Apply 2 g topically 4 (four) times daily as needed (pain).     Marland Kitchen diphenhydrAMINE (BENADRYL) 25 MG tablet Take 25 mg by mouth in the morning and at bedtime.     Marland Kitchen ENTRESTO 24-26 MG TAKE ONE TABLET BY MOUTH 2 TIMES A DAY (Patient taking differently: Take 1 tablet by mouth 2 (two) times daily. ) 60 tablet 0  . famotidine (PEPCID) 20 MG tablet Take 20 mg by mouth daily as needed for heartburn or indigestion.    . Ferrous Sulfate (IRON) 28 MG TABS Take 28 mg by mouth daily.     . fluticasone (FLONASE) 50 MCG/ACT nasal spray Place 2 sprays into both nostrils daily.     . furosemide (LASIX) 40 MG tablet Take 1 tablet (  40 mg total) by mouth 2 (two) times daily. 180 tablet 0  . gabapentin (NEURONTIN) 800 MG tablet Take 800 mg by mouth every 6 (six) hours as needed (pain).     Marland Kitchen ketoconazole (NIZORAL) 2 % cream Apply 1 application topically 2 (two) times daily.     Marland Kitchen levothyroxine (SYNTHROID, LEVOTHROID) 200 MCG tablet Take 1 tablet (200 mcg total) by mouth daily before breakfast. 90 tablet 2  . Multiple Vitamins-Minerals (CENTRAVITES 50 PLUS PO) Take 1 tablet by mouth daily.     . pantoprazole (PROTONIX) 20 MG tablet Take 1 tablet (20 mg total) by mouth daily. 90 tablet 1  . potassium chloride SA (K-DUR) 20 MEQ tablet Take 20 mEq by mouth daily.     . psyllium (METAMUCIL) 58.6 % packet Take 1 packet by mouth daily.    . rosuvastatin (CRESTOR) 20 MG tablet Take 20 mg by mouth daily.    . Simethicone (GAS-X PO) Take 1 tablet by mouth in the morning and at bedtime.     . topiramate (TOPAMAX) 25 MG tablet Take one tablet at night for one week, then take 2 tablets at night for one week, then take 3 tablets at night. 90 tablet 3  . Vitamin D, Ergocalciferol, (DRISDOL) 1.25 MG (50000 UNIT) CAPS capsule Take 50,000 Units by mouth every 30 (thirty) days.     No facility-administered medications prior to visit.     Allergies:   Fish allergy, Iodinated diagnostic agents,  Iodine, Lentil, Penicillins, Lipitor [atorvastatin], Spironolactone, and Sulfa antibiotics   Social History   Socioeconomic History  . Marital status: Divorced    Spouse name: Not on file  . Number of children: 2  . Years of education: Not on file  . Highest education level: Not on file  Occupational History    Employer: [employer]    Comment: Works in Environmental consultant  Tobacco Use  . Smoking status: Current Every Day Smoker    Packs/day: 0.75    Years: 21.00    Pack years: 15.75    Types: Cigarettes    Start date: 07/02/1983  . Smokeless tobacco: Never Used  Vaping Use  . Vaping Use: Former  Substance and Sexual Activity  . Alcohol use: No    Alcohol/week: 0.0 standard drinks  . Drug use: No  . Sexual activity: Not Currently    Birth control/protection: Abstinence, Surgical    Comment: tubal  Other Topics Concern  . Not on file  Social History Narrative  . Not on file   Social Determinants of Health   Financial Resource Strain:   . Difficulty of Paying Living Expenses:   Food Insecurity:   . Worried About Charity fundraiser in the Last Year:   . Arboriculturist in the Last Year:   Transportation Needs: No Transportation Needs  . Lack of Transportation (Medical): No  . Lack of Transportation (Non-Medical): No  Physical Activity:   . Days of Exercise per Week:   . Minutes of Exercise per Session:   Stress:   . Feeling of Stress :   Social Connections:   . Frequency of Communication with Friends and Family:   . Frequency of Social Gatherings with Friends and Family:   . Attends Religious Services:   . Active Member of Clubs or Organizations:   . Attends Archivist Meetings:   Marland Kitchen Marital Status:      Family History:  The patient's family history includes Cardiomyopathy in  her father and mother; Coronary artery disease in her father; Diabetes in her mother; Endometriosis in her sister; Heart disease in her maternal grandfather, maternal  grandmother, paternal grandfather, and paternal grandmother; Heart failure in her brother, father, and mother; Hypertension in her brother, daughter, and mother; Obesity in her daughter; Thyroid disease in her mother.   Review of Systems:   Please see the history of present illness.     General:  No chills, fever, night sweats or weight changes.  Cardiovascular:  No chest pain,  edema, orthopnea, palpitations, paroxysmal nocturnal dyspnea. Positive for dyspnea on exertion.  Dermatological: No rash, lesions/masses Respiratory: No cough, dyspnea Urologic: No hematuria, dysuria Abdominal:   No nausea, vomiting, diarrhea, bright red blood per rectum, melena, or hematemesis Neurologic:  No visual changes, wkns, changes in mental status. All other systems reviewed and are otherwise negative except as noted above.   Physical Exam:    VS:  BP 110/62   Pulse 76   Ht 5' 7.5" (1.715 m)   Wt 282 lb (127.9 kg)   SpO2 96%   BMI 43.52 kg/m    General: Well developed, obese female appearing in no acute distress. Head: Normocephalic, atraumatic. Neck: No carotid bruits. JVD not elevated.  Lungs: Respirations regular and unlabored, without wheezes or rales.  Heart: Regular rate and rhythm. No S3 or S4.  No murmur, no rubs, or gallops appreciated. Abdomen: Appears non-distended. No obvious abdominal masses. Msk:  Strength and tone appear normal for age. No obvious joint deformities or effusions. Extremities: No clubbing or cyanosis. No lower extremity edema. Compression stockings in place. Distal pedal pulses are 2+ bilaterally. Neuro: Alert and oriented X 3. Moves all extremities spontaneously. No focal deficits noted. Psych:  Responds to questions appropriately with a normal affect. Skin: No rashes or lesions noted  Wt Readings from Last 3 Encounters:  10/23/19 282 lb (127.9 kg)  09/09/19 293 lb 3.4 oz (133 kg)  09/02/19 293 lb 8 oz (133.1 kg)     Studies/Labs Reviewed:   EKG:  EKG is not  ordered today.   Recent Labs: 07/02/2019: ALT 21 09/09/2019: B Natriuretic Peptide 22.0; BUN 14; Creatinine, Ser 0.72; Hemoglobin 13.3; Magnesium 2.2; Platelets 245; Potassium 3.4; Sodium 139   Lipid Panel    Component Value Date/Time   CHOL 152 07/03/2019 0416   TRIG 189 (H) 07/03/2019 0416   HDL 37 (L) 07/03/2019 0416   CHOLHDL 4.1 07/03/2019 0416   VLDL 38 07/03/2019 0416   LDLCALC 77 07/03/2019 0416    Additional studies/ records that were reviewed today include:   Echocardiogram: 06/2019 IMPRESSIONS    1. Left ventricular ejection fraction, by estimation, is 45 to 50%. The  left ventricle has mildly decreased function. Left ventricular endocardial  border not optimally defined to evaluate regional wall motion. There is  mild concentric left ventricular  hypertrophy. Left ventricular diastolic parameters are consistent with  Grade I diastolic dysfunction (impaired relaxation).  2. Right ventricular systolic function is normal. The right ventricular  size is mildly enlarged. Tricuspid regurgitation signal is inadequate for  assessing PA pressure.  3. The mitral valve is grossly normal. Trivial mitral valve  regurgitation. No evidence of mitral stenosis.  4. The aortic valve is grossly normal. Aortic valve regurgitation is not  visualized. No aortic stenosis is present.  5. The inferior vena cava is normal in size with greater than 50%  respiratory variability, suggesting right atrial pressure of 3 mmHg.   Assessment:  1. Chronic combined systolic and diastolic heart failure ([place])   2. Automatic implantable cardioverter-defibrillator in situ   3. Essential hypertension   4. Hyperlipidemia LDL goal <70   5. History of CVA (cerebrovascular accident)      Plan:   In order of problems listed above:  1. Chronic Combined Systolic and Diastolic CHF/NICM - Her EF was previously reduced at 20% in 2012 and she underwent ICD placement in 09/2011. Her EF had improved  to 45 to 50% by most recent echocardiogram in 06/2019. She does report having dyspnea on exertion in the warmer temperatures but denies any progression of her symptoms. No recent orthopnea or PND. She appears euvolemic by examination today. Continue current medication regimen with Entresto 24-26 mg twice daily, Lasix 40 mg twice daily and Coreg 25 mg twice daily.  2. ICD in Place - She is s/p St. Jude ICD implantation by Dr. Ashley Mariner 09/2011.  Most recent device interrogation last month showed normal device function.   3. HTN - BP is well controlled at 110/62 during today's visit. Continue current medication regimen with Coreg, Entresto and Lasix.  4. HLD - FLP in 06/2019 showed total cholesterol 152, triglycerides 189, HDL 37 and LDL 77. She remains on Crestor 20 mg daily with goal LDL less than 70 in the setting of prior CVA.  5. History of CVA - She is followed by Neurology and remains on ASA 81mg  daily, Plavix 75mg  daily and Crestor 20mg  daily.    Medication Adjustments/Labs and Tests Ordered: Current medicines are reviewed at length with the patient today.  Concerns regarding medicines are outlined above.  Medication changes, Labs and Tests ordered today are listed in the Patient Instructions below. Patient Instructions  Medication Instructions:  Your physician recommends that you continue on your current medications as directed. Please refer to the Current Medication list given to you today.  *If you need a refill on your cardiac medications before your next appointment, please call your pharmacy*   Lab Work: NONE   If you have labs (blood work) drawn today and your tests are completely normal, you will receive your results only by: Marland Kitchen MyChart Message (if you have MyChart) OR . A paper copy in the mail If you have any lab test that is abnormal or we need to change your treatment, we will call you to review the results.   Testing/Procedures: NONE    Follow-Up: At [facility], you and your health needs are our priority.  As part of our continuing mission to provide you with exceptional heart care, we have created designated Provider Care Teams.  These Care Teams include your primary Cardiologist (physician) and Advanced Practice Providers (APPs -  Physician Assistants and Nurse Practitioners) who all work together to provide you with the care you need, when you need it.  We recommend signing up for the patient portal called "MyChart".  Sign up information is provided on this After Visit Summary.  MyChart is used to connect with patients for Virtual Visits (Telemedicine).  Patients are able to view lab/test results, encounter notes, upcoming appointments, etc.  Non-urgent messages can be sent to your provider as well.   To learn more about what you can do with MyChart, go to [email or web address].    Your next appointment:   4 month(s)  The format for your next appointment:   In Person  Provider:   Carlyle Dolly, MD or Bernerd Pho, PA-C   Other Instructions Thank you for choosing [facility]!       Signed, Erma Heritage, PA-C  10/23/2019 5:11 PM    [place] [address] [place], [place] [address] Phone: [phone] Fax: [phone]

## 2019-10-23 ENCOUNTER — Ambulatory Visit (INDEPENDENT_AMBULATORY_CARE_PROVIDER_SITE_OTHER): Payer: Medicare Other | Admitting: Student

## 2019-10-23 ENCOUNTER — Other Ambulatory Visit: Payer: Self-pay

## 2019-10-23 ENCOUNTER — Encounter: Payer: Self-pay | Admitting: Student

## 2019-10-23 VITALS — BP 110/62 | HR 76 | Ht 67.5 in | Wt 282.0 lb

## 2019-10-23 DIAGNOSIS — E785 Hyperlipidemia, unspecified: Secondary | ICD-10-CM

## 2019-10-23 DIAGNOSIS — I5042 Chronic combined systolic (congestive) and diastolic (congestive) heart failure: Secondary | ICD-10-CM

## 2019-10-23 DIAGNOSIS — Z8673 Personal history of transient ischemic attack (TIA), and cerebral infarction without residual deficits: Secondary | ICD-10-CM

## 2019-10-23 DIAGNOSIS — I10 Essential (primary) hypertension: Secondary | ICD-10-CM | POA: Diagnosis not present

## 2019-10-23 DIAGNOSIS — Z95810 Presence of automatic (implantable) cardiac defibrillator: Secondary | ICD-10-CM | POA: Diagnosis not present

## 2019-10-23 NOTE — Patient Instructions (Signed)
Medication Instructions:  Your physician recommends that you continue on your current medications as directed. Please refer to the Current Medication list given to you today.  *If you need a refill on your cardiac medications before your next appointment, please call your pharmacy*   Lab Work: NONE   If you have labs (blood work) drawn today and your tests are completely normal, you will receive your results only by:  [place] (if you have MyChart) OR  A paper copy in the mail If you have any lab test that is abnormal or we need to change your treatment, we will call you to review the results.   Testing/Procedures: NONE    Follow-Up: At [facility], you and your health needs are our priority.  As part of our continuing mission to provide you with exceptional heart care, we have created designated Provider Care Teams.  These Care Teams include your primary Cardiologist (physician) and Advanced Practice Providers (APPs -  Physician Assistants and Nurse Practitioners) who all work together to provide you with the care you need, when you need it.  We recommend signing up for the patient portal called "MyChart".  Sign up information is provided on this After Visit Summary.  MyChart is used to connect with patients for Virtual Visits (Telemedicine).  Patients are able to view lab/test results, encounter notes, upcoming appointments, etc.  Non-urgent messages can be sent to your provider as well.   To learn more about what you can do with MyChart, go to [email or web address].    Your next appointment:   4 month(s)  The format for your next appointment:   In Person  Provider:   Carlyle Dolly, MD or Bernerd Pho, PA-C   Other Instructions Thank you for choosing [facility]!

## 2019-12-05 ENCOUNTER — Encounter: Payer: Self-pay | Admitting: Neurology

## 2019-12-05 ENCOUNTER — Ambulatory Visit (INDEPENDENT_AMBULATORY_CARE_PROVIDER_SITE_OTHER): Payer: Medicare Other | Admitting: Neurology

## 2019-12-05 VITALS — BP 114/72 | Ht 67.5 in | Wt 274.4 lb

## 2019-12-05 DIAGNOSIS — G43809 Other migraine, not intractable, without status migrainosus: Secondary | ICD-10-CM

## 2019-12-05 MED ORDER — TOPIRAMATE 25 MG PO TABS: ORAL_TABLET | ORAL | 11 refills | Status: DC

## 2019-12-05 NOTE — Patient Instructions (Signed)
We will continue the Topamax at current dosing If headaches increase please let us know  See you back in 8 months

## 2019-12-05 NOTE — Progress Notes (Signed)
PATIENT: Melanie Cordova DOB: [birth year]  REASON FOR VISIT: follow up HISTORY FROM: patient  HISTORY OF PRESENT ILLNESS: Today 12/05/19 Melanie Cordova is a 59 year old female with history of TIA associated with left facial droop and slurred speech on July 02, 2019.  She was given TPA.  She has a defibrillator, cannot have MRI.  CT scan of the brain did not show any acute stroke event.  She has history of migraines, she did have a headache with this event, she may have had a migraine with hemiplegic features (confirms history hemiplegic migraines).  She remains on aspirin at this point and Plavix..  She has been started on Topamax 75 mg at bedtime.  She has history of seizures as a child, was previously treated with phenobarbital and Dilantin, no seizures since her 23s, has come off seizure medications.  Headaches much improved with Topamax, was having 2-3 a week, now 1 weekly, will lay down to relieve headache. Is under more stress, her daughter and granddaughter moved in with her into her 1-bedroom apartment.  She is smoking 1/2 pk cigarettes daily, does not drive.  Here today for follow-up unaccompanied, using cane.   HISTORY  09/02/2019 Dr. Jannifer Franklin: Melanie Cordova is a 59 year old left-handed white female with a history of obesity, cardiomyopathy with congestive heart failure, tobacco abuse, dyslipidemia, and a history of hypertension.  The patient has a defibrillator in place, she cannot have MRI evaluation of the brain.  The patient was admitted to the hospital on 02 July 2019.  She presented with onset of some chest discomfort and left arm pain the day prior, she was noted to have some left facial droop and slurred speech in the emergency room and was given TPA.  She was transferred to [facility] for further evaluation.  The patient claims that she also had numbness on the left face, arm, and leg with weakness on that side as well.  She indicates a left occipital headache associated with  photophobia without phonophobia and some nausea without vomiting.  The patient was on aspirin prior to the hospitalization.  She was converted to aspirin and Plavix for 3 weeks and then converted back to aspirin.  CT scan evaluation of the brain on 2 occasions did not show any acute stroke event.  The patient claims that she had focal symptoms for greater than 1 day.  By history, the patient has had severe migraine headache in the past.  She claims that when she was a child she would have very severe headaches that would also be associated with left-sided numbness and weakness.  She denies any family history of headache.  With the above TIA event, she noted some blurring of vision without loss of vision.  She denied any problems with swallowing.  The patient has been using a cane for ambulation for about 10 years.  She denies any falls or blackouts.  She underwent a carotid Doppler study that was unremarkable, and a 2D echocardiogram showed an ejection fraction of 45 to 50%.  Her hemoglobin A1c was 5.7, her LDL cholesterol was 77.  The patient does note urinary frequency and some incontinence, she has a lot of gas in her stomach.  She comes to the office today for further evaluation.  REVIEW OF SYSTEMS: Out of a complete 14 system review of symptoms, the patient complains only of the following symptoms, and all other reviewed systems are negative.  Headache  ALLERGIES: Allergies  Allergen Reactions   Fish Allergy Anaphylaxis  Iodinated Diagnostic Agents Hives and Other (See Comments)    Pulmonary problems; no frank respiratory arrest   Iodine Hives and Other (See Comments)    Pulmonary Problems    Lentil Anaphylaxis   Penicillins Anaphylaxis and Shortness Of Breath    Has patient had a PCN reaction causing immediate rash, facial/tongue/throat swelling, SOB or lightheadedness with hypotension: Yes Has patient had a PCN reaction causing severe rash involving mucus membranes or skin necrosis:  No Has patient had a PCN reaction that required hospitalization No Has patient had a PCN reaction occurring within the last 10 years: No If all of the above answers are "NO", then may proceed with Cephalosporin use.  Hair loss   Lipitor [Atorvastatin] Itching and Rash   Spironolactone Rash   Sulfa Antibiotics Other (See Comments)    Unknown    HOME MEDICATIONS: Outpatient Medications Prior to Visit  Medication Sig Dispense Refill   acetaminophen (TYLENOL 8 HOUR ARTHRITIS PAIN) 650 MG CR tablet Take 650 mg by mouth every 8 (eight) hours as needed for pain.     albuterol (PROVENTIL HFA;VENTOLIN HFA) 108 (90 BASE) MCG/ACT inhaler Inhale 2 puffs into the lungs every 6 (six) hours as needed for wheezing or shortness of breath. 1 Inhaler 5   aspirin EC 81 MG tablet Take 1 tablet (81 mg total) by mouth daily. 21 tablet 0   Biotin 10000 MCG TABS Take 10,000 mcg by mouth daily.      budesonide-formoterol (SYMBICORT) 80-4.5 MCG/ACT inhaler Inhale 2 puffs into the lungs 2 (two) times daily.     carvedilol (COREG) 12.5 MG tablet Take 25 mg by mouth 2 (two) times daily with a meal.     cetirizine (ZYRTEC) 10 MG tablet Take 10 mg by mouth daily.     Cholecalciferol (VITAMIN D3) 5000 units CAPS TAKE ONE CAPSULE BY MOUTH DAILY 90 capsule 0   clobetasol (TEMOVATE) 0.05 % external solution Apply 1 application topically 2 (two) times daily as needed (Apply to scalp.).      clopidogrel (PLAVIX) 75 MG tablet Take 1 tablet (75 mg total) by mouth daily. 21 tablet 0   diclofenac sodium (VOLTAREN) 1 % GEL Apply 2 g topically 4 (four) times daily as needed (pain).      diphenhydrAMINE (BENADRYL) 25 MG tablet Take 25 mg by mouth in the morning and at bedtime.      ENTRESTO 24-26 MG TAKE ONE TABLET BY MOUTH 2 TIMES A DAY (Patient taking differently: Take 1 tablet by mouth 2 (two) times daily. ) 60 tablet 0   famotidine (PEPCID) 20 MG tablet Take 20 mg by mouth daily as needed for heartburn or  indigestion.     Ferrous Sulfate (IRON) 28 MG TABS Take 28 mg by mouth daily.      fluticasone (FLONASE) 50 MCG/ACT nasal spray Place 2 sprays into both nostrils daily.      furosemide (LASIX) 40 MG tablet Take 1 tablet (40 mg total) by mouth 2 (two) times daily. 180 tablet 0   ketoconazole (NIZORAL) 2 % cream Apply 1 application topically 2 (two) times daily.      levothyroxine (SYNTHROID, LEVOTHROID) 200 MCG tablet Take 1 tablet (200 mcg total) by mouth daily before breakfast. 90 tablet 2   Multiple Vitamins-Minerals (CENTRAVITES 50 PLUS PO) Take 1 tablet by mouth daily.      pantoprazole (PROTONIX) 20 MG tablet Take 1 tablet (20 mg total) by mouth daily. 90 tablet 1   potassium chloride SA (K-DUR) 20  MEQ tablet Take 20 mEq by mouth daily.      psyllium (METAMUCIL) 58.6 % packet Take 1 packet by mouth daily.     rosuvastatin (CRESTOR) 20 MG tablet Take 20 mg by mouth daily.     Simethicone (GAS-X PO) Take 1 tablet by mouth in the morning and at bedtime.      Vitamin D, Ergocalciferol, (DRISDOL) 1.25 MG (50000 UNIT) CAPS capsule Take 50,000 Units by mouth every 30 (thirty) days.     topiramate (TOPAMAX) 25 MG tablet Take one tablet at night for one week, then take 2 tablets at night for one week, then take 3 tablets at night. 90 tablet 3   gabapentin (NEURONTIN) 800 MG tablet Take 800 mg by mouth every 6 (six) hours as needed (pain).      No facility-administered medications prior to visit.    PAST MEDICAL HISTORY: Past Medical History:  Diagnosis Date   Anxiety    Arthritis    Asthma    Automatic implantable cardioverter-defibrillator in situ    2013, july   Bell palsy    states has had 3 episodes   Cardiomyopathy 08/2010   Presented with congestive heart failure; EF of 15% and 2012; hypotension on medication precludes optimal dosing   CHF (congestive heart failure) ([place])    a. EF 20% in 2012 with low-risk NST --> St. Jude ICD implantation by Dr. Lovena Le in  57/8469   Chronic systolic heart failure ([place])    COPD (chronic obstructive pulmonary disease) ([place])    2013   Dysrhythmia    Gastroesophageal reflux disease    Headache(784.0)    Hot flashes 10/04/2016   Hyperlipidemia    Hypertension    09/2010-normal CMet and CBC; Lipid profile-116, 88, 25, 73   Hypothyroidism    Recent TSH was normal.   ICD (implantable cardiac defibrillator) in place 10/10/2011   Neuromuscular disorder (HCC)    Neuropathy, right foot and leg   Neuropathy    Obesity    Pneumonia    Seizures ([place])    last one at age 34   Shortness of breath    Stroke ([place])     stroke in 07/2012 and another in June, 2014   Tobacco abuse    20 pack years    PAST SURGICAL HISTORY: Past Surgical History:  Procedure Laterality Date   CESAREAN SECTION     X2   COLONOSCOPY N/A 12/16/2013   One simple adenoma and 3 hyperplastic polyps. Small internal hemorrhoids. Left colon redundant. Due for surveillance 2020.    COLONOSCOPY WITH PROPOFOL N/A 06/04/2019   Procedure: COLONOSCOPY WITH PROPOFOL;  Surgeon: Danie Binder, MD;  [facility];  Service: Endoscopy;  Laterality: N/A;  10:30am   EP IMPLANTABLE DEVICE     St. Jude   EP IMPLANTABLE DEVICE N/A 08/13/2015   Procedure: Loop Recorder Insertion;  Surgeon: Evans Lance, MD;  Location: [place] [facility];  Service: Cardiovascular;  Laterality: N/A;   IMPLANTABLE CARDIOVERTER DEFIBRILLATOR IMPLANT N/A 10/10/2011   Procedure: IMPLANTABLE CARDIOVERTER DEFIBRILLATOR IMPLANT;  Surgeon: Evans Lance, MD;  Location: [facility] CATH LAB;  Service: Cardiovascular;  Laterality: N/A;   MULTIPLE EXTRACTIONS WITH ALVEOLOPLASTY N/A 09/24/2012   Procedure: MULTIPLE EXTRACION #2, 4, 6, 7 ,8, 9, 11, 13, 18, 20, 21, 22, 23, 24, 25, 26, 27, 29 WITH ALVEOLOPLASTY, BIOPSY OF PALATE LESION, REMOVA RIGHT LINGUAL TORUS;  Surgeon: Gae Bon, DDS;  Location: [facility];  Service: Oral Surgery;  Laterality: N/A;   POLYPECTOMY   06/04/2019   Procedure: POLYPECTOMY;  Surgeon: Danie Binder, MD;  [facility];  Service: Endoscopy;;   TEE WITHOUT CARDIOVERSION N/A 07/11/2012   Procedure: TRANSESOPHAGEAL ECHOCARDIOGRAM (TEE);  Surgeon: Thayer Headings, MD;  Location: [facility];  Service: Cardiovascular;  Laterality: N/A;   TUBAL LIGATION      FAMILY HISTORY: Family History  Problem Relation Age of Onset   Cardiomyopathy Mother        ICD pacemaker-ischmic CM   Heart failure Mother    Hypertension Mother    Diabetes Mother    Thyroid disease Mother    Cardiomyopathy Father        Deceased   Coronary artery disease Father    Heart failure Father    Heart failure Brother    Hypertension Brother    Endometriosis Sister    Heart disease Paternal Grandfather    Heart disease Paternal Grandmother    Heart disease Maternal Grandmother    Heart disease Maternal Grandfather    Hypertension Daughter    Obesity Daughter    Colon cancer Neg Hx    Liver disease Neg Hx     SOCIAL HISTORY: Social History   Socioeconomic History   Marital status: Divorced    Spouse name: Not on file   Number of children: 2   Years of education: Not on file   Highest education level: Not on file  Occupational History    Employer: [employer]    Comment: Works in Environmental consultant  Tobacco Use   Smoking status: Current Every Day Smoker    Packs/day: 0.75    Years: 21.00    Pack years: 15.75    Types: Cigarettes    Start date: 07/02/1983   Smokeless tobacco: Never Used  Vaping Use   Vaping Use: Former  Substance and Sexual Activity   Alcohol use: No    Alcohol/week: 0.0 standard drinks   Drug use: No   Sexual activity: Not Currently    Birth control/protection: Abstinence, Surgical    Comment: tubal  Other Topics Concern   Not on file  Social History Narrative   Not on file   Social Determinants of Health   Financial Resource Strain:    Difficulty of Paying  Living Expenses: Not on file  Food Insecurity:    Worried About Charity fundraiser in the Last Year: Not on file   [employer] of Food in the Last Year: Not on file  Transportation Needs: No Transportation Needs   Lack of Transportation (Medical): No   Lack of Transportation (Non-Medical): No  Physical Activity:    Days of Exercise per Week: Not on file   Minutes of Exercise per Session: Not on file  Stress:    Feeling of Stress : Not on file  Social Connections:    Frequency of Communication with Friends and Family: Not on file   Frequency of Social Gatherings with Friends and Family: Not on file   Attends Religious Services: Not on file   Active Member of Clubs or Organizations: Not on file   Attends Archivist Meetings: Not on file   Marital Status: Not on file  Intimate Partner Violence:    Fear of Current or Ex-Partner: Not on file   Emotionally Abused: Not on file   Physically Abused: Not on file   Sexually Abused: Not on file   PHYSICAL EXAM  Vitals:   12/05/19 1039  BP: 114/72  Weight: 274 lb 6.4 oz (124.5 kg)  Height: 5' 7.5" (1.715 m)   Body mass index is 42.34 kg/m.  Generalized: Well developed, in no acute distress   Neurological examination  Mentation: Alert oriented to time, place, history taking. Follows all commands speech and language fluent Cranial nerve II-XII: Pupils were equal Cordova reactive to light. Extraocular movements were full, visual field were full on confrontational test. Facial sensation and strength were normal. Head turning and shoulder shrug  were normal and symmetric. Motor: The motor testing reveals 5 over 5 strength of all 4 extremities. Good symmetric motor tone is noted throughout.  Sensory: Sensory testing is intact to soft touch on all 4 extremities. No evidence of extinction is noted.  Coordination: Cerebellar testing reveals good finger-nose-finger and heel-to-shin bilaterally.  Gait and station: Gait is  slightly wide-based, uses a single-point cane for ambulation. Reflexes: Deep tendon reflexes are symmetric but depressed throughout  DIAGNOSTIC DATA (LABS, IMAGING, TESTING) - I reviewed patient records, labs, notes, testing and imaging myself where available.  Lab Results  Component Value Date   WBC 9.2 09/09/2019   HGB 13.3 09/09/2019   HCT 41.5 09/09/2019   MCV 92.2 09/09/2019   PLT 245 09/09/2019      Component Value Date/Time   NA 139 09/09/2019 2344   K 3.4 (L) 09/09/2019 2344   CL 100 09/09/2019 2344   CO2 28 09/09/2019 2344   GLUCOSE 106 (H) 09/09/2019 2344   BUN 14 09/09/2019 2344   CREATININE 0.72 09/09/2019 2344   CREATININE 0.70 02/06/2018 1020   CALCIUM 9.2 09/09/2019 2344   PROT 7.1 07/02/2019 1149   ALBUMIN 3.8 07/02/2019 1149   AST 17 07/02/2019 1149   ALT 21 07/02/2019 1149   ALKPHOS 85 07/02/2019 1149   BILITOT 0.4 07/02/2019 1149   GFRNONAA >60 09/09/2019 2344   GFRNONAA 86 02/19/2016 1013   GFRAA >60 09/09/2019 2344   GFRAA >89 02/19/2016 1013   Lab Results  Component Value Date   CHOL 152 07/03/2019   HDL 37 (L) 07/03/2019   LDLCALC 77 07/03/2019   TRIG 189 (H) 07/03/2019   CHOLHDL 4.1 07/03/2019   Lab Results  Component Value Date   HGBA1C 5.7 (H) 07/03/2019   Lab Results  Component Value Date   VITAMINB12 328 07/27/2015   Lab Results  Component Value Date   TSH 1.44 09/05/2018   ASSESSMENT AND PLAN 59 y.o. year old female  has a past medical history of Anxiety, Arthritis, Asthma, Automatic implantable cardioverter-defibrillator in situ, Bell palsy, Cardiomyopathy (08/2010), CHF (congestive heart failure) (HCC), Chronic systolic heart failure ([place]), COPD (chronic obstructive pulmonary disease) ([place]), Dysrhythmia, Gastroesophageal reflux disease, Headache(784.0), Hot flashes (10/04/2016), Hyperlipidemia, Hypertension, Hypothyroidism, ICD (implantable cardiac defibrillator) in place (10/10/2011), Neuromuscular disorder ([place]), Neuropathy,  Obesity, Pneumonia, Seizures ([place]), Shortness of breath, Stroke ([place]), and Tobacco abuse. here with:  1.  TIA event 2.  History of migraine with hemiplegic features 3.  Ongoing tobacco abuse  -Good benefit with Topamax for headaches  -Continue Topamax 75 mg at bedtime -Continue follow-up with PCP for management of vascular risk factors -Follow-up in 8 months or sooner if needed  I spent 30 minutes of face-to-face and non-face-to-face time with patient.  This included previsit chart review, lab review, study review, order entry, electronic health record documentation, patient education.  Butler Denmark, AGNP-C, DNP 12/05/2019, 11:11 AM [facility] [address] [place], [place] [address] [phone]

## 2019-12-05 NOTE — Progress Notes (Signed)
I have read the note, and I agree with the clinical assessment and plan.  Brindy Higginbotham K Jaleigh Mccroskey   

## 2019-12-06 ENCOUNTER — Other Ambulatory Visit: Payer: Self-pay | Admitting: Neurology

## 2019-12-11 ENCOUNTER — Other Ambulatory Visit (HOSPITAL_COMMUNITY): Payer: Self-pay | Admitting: Internal Medicine

## 2019-12-11 DIAGNOSIS — Z1231 Encounter for screening mammogram for malignant neoplasm of breast: Secondary | ICD-10-CM

## 2019-12-19 ENCOUNTER — Ambulatory Visit (HOSPITAL_COMMUNITY)
Admission: RE | Admit: 2019-12-19 | Discharge: 2019-12-19 | Disposition: A | Discharge status: Home / Self Care | Payer: Medicare Other | Source: Ambulatory Visit | Attending: Internal Medicine | Admitting: Internal Medicine

## 2019-12-19 ENCOUNTER — Other Ambulatory Visit: Payer: Self-pay

## 2019-12-19 DIAGNOSIS — Z1231 Encounter for screening mammogram for malignant neoplasm of breast: Secondary | ICD-10-CM | POA: Insufficient documentation

## 2019-12-24 ENCOUNTER — Ambulatory Visit (INDEPENDENT_AMBULATORY_CARE_PROVIDER_SITE_OTHER): Payer: Medicare Other

## 2019-12-24 DIAGNOSIS — I639 Cerebral infarction, unspecified: Secondary | ICD-10-CM | POA: Diagnosis not present

## 2019-12-24 LAB — CUP PACEART REMOTE DEVICE CHECK
Battery Remaining Longevity: 14 mo
Battery Remaining Percentage: 13 %
Battery Voltage: 2.71 V
Brady Statistic RV Percent Paced: 1 %
Date Time Interrogation Session: 20211012020010
HighPow Impedance: 87 Ohm
HighPow Impedance: 87 Ohm
Implantable Lead Implant Date: 20130729
Implantable Lead Location: 753860
Implantable Lead Model: 181
Implantable Lead Serial Number: 322336
Implantable Pulse Generator Implant Date: 20130729
Lead Channel Impedance Value: 640 Ohm
Lead Channel Pacing Threshold Amplitude: 0.75 V
Lead Channel Pacing Threshold Pulse Width: 0.5 ms
Lead Channel Sensing Intrinsic Amplitude: 12 mV
Lead Channel Setting Pacing Amplitude: 2.5 V
Lead Channel Setting Pacing Pulse Width: 0.5 ms
Lead Channel Setting Sensing Sensitivity: 0.5 mV
Pulse Gen Serial Number: 1022442

## 2019-12-27 NOTE — Progress Notes (Signed)
Remote ICD transmission.   

## 2020-02-26 ENCOUNTER — Other Ambulatory Visit: Payer: Self-pay

## 2020-02-26 ENCOUNTER — Ambulatory Visit (INDEPENDENT_AMBULATORY_CARE_PROVIDER_SITE_OTHER): Payer: Medicare Other | Admitting: Student

## 2020-02-26 ENCOUNTER — Encounter: Payer: Self-pay | Admitting: Student

## 2020-02-26 VITALS — BP 118/70 | HR 82 | Ht 67.5 in | Wt 264.0 lb

## 2020-02-26 DIAGNOSIS — I428 Other cardiomyopathies: Secondary | ICD-10-CM

## 2020-02-26 DIAGNOSIS — M79604 Pain in right leg: Secondary | ICD-10-CM

## 2020-02-26 DIAGNOSIS — Z8673 Personal history of transient ischemic attack (TIA), and cerebral infarction without residual deficits: Secondary | ICD-10-CM | POA: Diagnosis not present

## 2020-02-26 DIAGNOSIS — I10 Essential (primary) hypertension: Secondary | ICD-10-CM

## 2020-02-26 DIAGNOSIS — I5042 Chronic combined systolic (congestive) and diastolic (congestive) heart failure: Secondary | ICD-10-CM

## 2020-02-26 DIAGNOSIS — M79605 Pain in left leg: Secondary | ICD-10-CM

## 2020-02-26 NOTE — Progress Notes (Signed)
Cardiology Office Note    Date:  02/27/2020   ID:  ZADIE DEEMER, DOB May 12, 1960, MRN [id number]  PCP:  Rosita Fire, MD  Cardiologist: [name], MD    Chief Complaint  Patient presents with  . Follow-up    4 month visit    History of Present Illness:    Melanie Cordova is a 59 y.o. female with past medical history of presumed nonischemic cardiomyopathy (EF 20% in 2012 with low-risk NST --> St. Jude ICD implantation by Dr. Ashley Mariner 09/2011, NST in 12/2017 showing evidence of prior infarct without ischemia), chronic combined systolic and diastolic CHF (EF 10-62% by echo in 2015, at 35-40% by repeat imaging in 12/2017, EF 45-50% by echo in 06/2019), HTN, HLD, COPD, tobacco useand prior CVA who presents to the office today for 46-month follow-up.   She was last examined by myself in 10/2019 and reported being under increased stress secondary to her living situation as her daughter and granddaughter were staying with her. She denied any recent anginal symptoms and reported having lost over 11 lbs within 2 months due to increased exercise. She was continued on her current medications at that time. Her ICD was interrogated in 12/2019 and showed normal device function.   In talking with the patient today, she reports overall doing well since her last visit.  She denies any recent chest pain or dyspnea on exertion. No recent orthopnea, PND or lower extremity edema. She reports her most limiting factor at this time is arthritis and she takes Tylenol with improvement in her symptoms. She has lost over 20 pounds since her last visit and over 30 pounds since 2020. She says she has been more active at baseline and she is helping to take care of her 3-year-old granddaughter.  Past Medical History:  Diagnosis Date  . Anxiety   . Arthritis   . Asthma   . Automatic implantable cardioverter-defibrillator in situ    2013, july  . Bell palsy    states has had 3 episodes  .  Cardiomyopathy 08/2010   Presented with congestive heart failure; EF of 15% and 2012; hypotension on medication precludes optimal dosing  . CHF (congestive heart failure) ([place])    a. EF 20% in 2012 with low-risk NST --> St. Jude ICD implantation by Dr. Lovena Le in 09/2011  . Chronic systolic heart failure ([place])   . COPD (chronic obstructive pulmonary disease) ([place])    2013  . Dysrhythmia   . Gastroesophageal reflux disease   . Headache(784.0)   . Hot flashes 10/04/2016  . Hyperlipidemia   . Hypertension    09/2010-normal CMet and CBC; Lipid profile-116, 88, 25, 73  . Hypothyroidism    Recent TSH was normal.  . 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 Neuromuscular disorder (HCC)    Neuropathy, right foot and leg  . Neuropathy   . Obesity   . Pneumonia   . Seizures ([place])    last one at age 84  . Shortness of breath   . Stroke [facility])     stroke in 07/2012 and another in June, 2014  . Tobacco abuse    20 pack years    Past Surgical History:  Procedure Laterality Date  . CESAREAN SECTION     X2  . COLONOSCOPY N/A 12/16/2013   One simple adenoma and 3 hyperplastic polyps. Small internal hemorrhoids. Left colon redundant. Due for surveillance 2020.   Marland Kitchen COLONOSCOPY WITH PROPOFOL N/A 06/04/2019   Procedure:  COLONOSCOPY WITH PROPOFOL;  Surgeon: Danie Binder, MD;  [facility];  Service: Endoscopy;  Laterality: N/A;  10:30am  . EP IMPLANTABLE DEVICE     St. Jude  . EP IMPLANTABLE DEVICE N/A 08/13/2015   Procedure: Loop Recorder Insertion;  Surgeon: Evans Lance, MD;  Location: [place] CV LAB;  Service: Cardiovascular;  Laterality: N/A;  . IMPLANTABLE CARDIOVERTER DEFIBRILLATOR IMPLANT N/A 10/10/2011   Procedure: IMPLANTABLE CARDIOVERTER DEFIBRILLATOR IMPLANT;  Surgeon: Evans Lance, MD;  Location: [facility] CATH LAB;  Service: Cardiovascular;  Laterality: N/A;  . MULTIPLE EXTRACTIONS WITH ALVEOLOPLASTY N/A 09/24/2012   Procedure: MULTIPLE EXTRACION #2, 4, 6,  7 ,8, 9, 11, 13, 18, 20, 21, 22, 23, 24, 25, 26, 27, 29 WITH ALVEOLOPLASTY, BIOPSY OF PALATE LESION, REMOVA RIGHT LINGUAL TORUS;  Surgeon: Gae Bon, DDS;  Location: [place];  Service: Oral Surgery;  Laterality: N/A;  . POLYPECTOMY  06/04/2019   Procedure: POLYPECTOMY;  Surgeon: Danie Binder, MD;  [facility];  Service: Endoscopy;;  . TEE WITHOUT CARDIOVERSION N/A 07/11/2012   Procedure: TRANSESOPHAGEAL ECHOCARDIOGRAM (TEE);  Surgeon: Thayer Headings, MD;  Location: [place];  Service: Cardiovascular;  Laterality: N/A;  . TUBAL LIGATION      Current Medications: Outpatient Medications Prior to Visit  Medication Sig Dispense Refill  . acetaminophen (TYLENOL) 650 MG CR tablet Take 650 mg by mouth every 8 (eight) hours as needed for pain.    Marland Kitchen albuterol (PROVENTIL HFA;VENTOLIN HFA) 108 (90 BASE) MCG/ACT inhaler Inhale 2 puffs into the lungs every 6 (six) hours as needed for wheezing or shortness of breath. 1 Inhaler 5  . aspirin EC 81 MG tablet Take 1 tablet (81 mg total) by mouth daily. 21 tablet 0  . Biotin 10000 MCG TABS Take 10,000 mcg by mouth daily.     . budesonide-formoterol (SYMBICORT) 80-4.5 MCG/ACT inhaler Inhale 2 puffs into the lungs 2 (two) times daily.    . carvedilol (COREG) 12.5 MG tablet Take 25 mg by mouth 2 (two) times daily with a meal.    . cetirizine (ZYRTEC) 10 MG tablet Take 10 mg by mouth daily.    . Cholecalciferol (VITAMIN D3) 5000 units CAPS TAKE ONE CAPSULE BY MOUTH DAILY 90 capsule 0  . clobetasol (TEMOVATE) 0.05 % external solution Apply 1 application topically 2 (two) times daily as needed (Apply to scalp.).     Marland Kitchen clopidogrel (PLAVIX) 75 MG tablet Take 1 tablet (75 mg total) by mouth daily. 21 tablet 0  . diclofenac sodium (VOLTAREN) 1 % GEL Apply 2 g topically 4 (four) times daily as needed (pain).     Marland Kitchen diphenhydrAMINE (BENADRYL) 25 MG tablet Take 25 mg by mouth in the morning and at bedtime.     Marland Kitchen ENTRESTO 24-26 MG TAKE ONE TABLET BY MOUTH 2  TIMES A DAY (Patient taking differently: Take 1 tablet by mouth 2 (two) times daily.) 60 tablet 0  . famotidine (PEPCID) 20 MG tablet Take 20 mg by mouth daily as needed for heartburn or indigestion.    . Ferrous Sulfate (IRON) 28 MG TABS Take 28 mg by mouth daily.     . fluticasone (FLONASE) 50 MCG/ACT nasal spray Place 2 sprays into both nostrils daily.    . furosemide (LASIX) 40 MG tablet Take 1 tablet (40 mg total) by mouth 2 (two) times daily. 180 tablet 0  . ketoconazole (NIZORAL) 2 % cream Apply 1 application topically 2 (two) times daily.     Marland Kitchen  levothyroxine (SYNTHROID, LEVOTHROID) 200 MCG tablet Take 1 tablet (200 mcg total) by mouth daily before breakfast. 90 tablet 2  . LINZESS 145 MCG CAPS capsule Take 145 mcg by mouth daily.    . Multiple Vitamins-Minerals (CENTRAVITES 50 PLUS PO) Take 1 tablet by mouth daily.     . pantoprazole (PROTONIX) 20 MG tablet Take 1 tablet (20 mg total) by mouth daily. 90 tablet 1  . potassium chloride SA (K-DUR) 20 MEQ tablet Take 20 mEq by mouth daily.     . psyllium (METAMUCIL) 58.6 % packet Take 1 packet by mouth daily.    . rosuvastatin (CRESTOR) 20 MG tablet Take 20 mg by mouth daily.    . Simethicone (GAS-X PO) Take 1 tablet by mouth in the morning and at bedtime.     . topiramate (TOPAMAX) 25 MG tablet Take 3 tablets at bedtime 90 tablet 11  . Vitamin D, Ergocalciferol, (DRISDOL) 1.25 MG (50000 UNIT) CAPS capsule Take 50,000 Units by mouth every 30 (thirty) days.     No facility-administered medications prior to visit.     Allergies:   Fish allergy, Iodinated diagnostic agents, Iodine, Lentil, Penicillins, Lipitor [atorvastatin], Spironolactone, and Sulfa antibiotics   Social History   Socioeconomic History  . Marital status: Divorced    Spouse name: Not on file  . Number of children: 2  . Years of education: Not on file  . Highest education level: Not on file  Occupational History    Employer: [employer]    Comment: Works in  Environmental consultant  Tobacco Use  . Smoking status: Current Every Day Smoker    Packs/day: 0.75    Years: 21.00    Pack years: 15.75    Types: Cigarettes    Start date: 07/02/1983  . Smokeless tobacco: Never Used  Vaping Use  . Vaping Use: Former  Substance and Sexual Activity  . Alcohol use: No    Alcohol/week: 0.0 standard drinks  . Drug use: No  . Sexual activity: Not Currently    Birth control/protection: Abstinence, Surgical    Comment: tubal  Other Topics Concern  . Not on file  Social History Narrative  . Not on file   Social Determinants of Health   Financial Resource Strain: Not on file  Food Insecurity: Not on file  Transportation Needs: No Transportation Needs  . Lack of Transportation (Medical): No  . Lack of Transportation (Non-Medical): No  Physical Activity: Not on file  Stress: Not on file  Social Connections: Not on file     Family History:  The patient's family history includes Cardiomyopathy in her father and mother; Coronary artery disease in her father; Diabetes in her mother; Endometriosis in her sister; Heart disease in her maternal grandfather, maternal grandmother, paternal grandfather, and paternal grandmother; Heart failure in her brother, father, and mother; Hypertension in her brother, daughter, and mother; Obesity in her daughter; Thyroid disease in her mother.   Review of Systems:   Please see the history of present illness.     General:  No chills, fever, night sweats or weight changes. Positive for joint pain.  Cardiovascular:  No chest pain, dyspnea on exertion, edema, orthopnea, palpitations, paroxysmal nocturnal dyspnea. Dermatological: No rash, lesions/masses Respiratory: No cough, dyspnea Urologic: No hematuria, dysuria Abdominal:   No nausea, vomiting, diarrhea, bright red blood per rectum, melena, or hematemesis Neurologic:  No visual changes, wkns, changes in mental status. All other systems reviewed and are otherwise negative  except as noted  above.   Physical Exam:    VS:  BP 118/70   Pulse 82   Ht 5' 7.5" (1.715 m)   Wt 264 lb (119.7 kg)   SpO2 97%   BMI 40.74 kg/m    General: Well developed, well nourished,female appearing in no acute distress. Head: Normocephalic, atraumatic. Neck: No carotid bruits. JVD not elevated.  Lungs: Respirations regular and unlabored, without wheezes or rales.  Heart: Regular rate and rhythm. No S3 or S4.  No murmur, no rubs, or gallops appreciated. Abdomen: Appears non-distended. No obvious abdominal masses. Msk:  Strength and tone appear normal for age. No obvious joint deformities or effusions. Extremities: No clubbing or cyanosis. No lower extremity edema.  Distal pedal pulses are 2+ bilaterally. Hyperpigmentation along top of feet bilaterally.  Neuro: Alert and oriented X 3. Moves all extremities spontaneously. No focal deficits noted. Psych:  Responds to questions appropriately with a normal affect. Skin: No rashes or lesions noted  Wt Readings from Last 3 Encounters:  02/26/20 264 lb (119.7 kg)  12/05/19 274 lb 6.4 oz (124.5 kg)  10/23/19 282 lb (127.9 kg)     Studies/Labs Reviewed:   EKG:  EKG is not ordered today.   Recent Labs: 07/02/2019: ALT 21 09/09/2019: B Natriuretic Peptide 22.0; BUN 14; Creatinine, Ser 0.72; Hemoglobin 13.3; Magnesium 2.2; Platelets 245; Potassium 3.4; Sodium 139   Lipid Panel    Component Value Date/Time   CHOL 152 07/03/2019 0416   TRIG 189 (H) 07/03/2019 0416   HDL 37 (L) 07/03/2019 0416   CHOLHDL 4.1 07/03/2019 0416   VLDL 38 07/03/2019 0416   LDLCALC 77 07/03/2019 0416    Additional studies/ records that were reviewed today include:   Echocardiogram: 07/03/2019 IMPRESSIONS    1. Left ventricular ejection fraction, by estimation, is 45 to 50%. The  left ventricle has mildly decreased function. Left ventricular endocardial  border not optimally defined to evaluate regional wall motion. There is  mild concentric left  ventricular  hypertrophy. Left ventricular diastolic parameters are consistent with  Grade I diastolic dysfunction (impaired relaxation).  2. Right ventricular systolic function is normal. The right ventricular  size is mildly enlarged. Tricuspid regurgitation signal is inadequate for  assessing PA pressure.  3. The mitral valve is grossly normal. Trivial mitral valve  regurgitation. No evidence of mitral stenosis.  4. The aortic valve is grossly normal. Aortic valve regurgitation is not  visualized. No aortic stenosis is present.  5. The inferior vena cava is normal in size with greater than 50%  respiratory variability, suggesting right atrial pressure of 3 mmHg.     Assessment:    1. Chronic combined systolic and diastolic heart failure ([place])   2. Nonischemic cardiomyopathy ([place])   3. Essential hypertension   4. History of CVA (cerebrovascular accident)   5. Bilateral leg pain      Plan:   In order of problems listed above:  1. Chronic Combined Systolic and Diastolic CHF/NICM - Her EF was at 20% in 2012 and improved to 45-50% by echo in 06/2019. She is s/p ICD placement by Dr. Lovena Le but denies any recent ICD firings and most recent interrogation showed normal device function.  - Her dyspnea has improved with weight loss and she denies any recent orthopnea, PND or edema.  - Continue current regimen with Coreg 25mg  BID, Entresto 24-26mg  BID and Lasix 40mg  BID.   2. HTN - BP is well-controlled at 118/70 during today's visit. Continue current medication regimen.  3. History of CVA - She has remained on both ASA and Plavix per Neurology. Also on Crestor 20mg  daily and LDL was at 77 when checked in 06/2019.  4. Bilateral Leg Pain - She describes pain along her lower extremities and it seems this is mostly isolated to the joints. Can occur at rest or with activity. She does have 2+ distal pulses on examination today. Also noted to have hyperpigmentation along the top of her  feet bilaterally. No recent sun exposure, new detergent or soaps. Will see if I can review with Dermatology but appears most consistent with stasis dermatitis.     Medication Adjustments/Labs and Tests Ordered: Current medicines are reviewed at length with the patient today.  Concerns regarding medicines are outlined above.  Medication changes, Labs and Tests ordered today are listed in the Patient Instructions below. Patient Instructions  Medication Instructions:  None Today *If you need a refill on your cardiac medications before your next appointment, please call your pharmacy*   Lab Work: None Today If you have labs (blood work) drawn today and your tests are completely normal, you will receive your results only by: Marland Kitchen MyChart Message (if you have MyChart) OR . A paper copy in the mail If you have any lab test that is abnormal or we need to change your treatment, we will call you to review the results.   Testing/Procedures: None Today   Follow-Up: At [facility], you and your health needs are our priority.  As part of our continuing mission to provide you with exceptional heart care, we have created designated Provider Care Teams.  These Care Teams include your primary Cardiologist (physician) and Advanced Practice Providers (APPs -  Physician Assistants and Nurse Practitioners) who all work together to provide you with the care you need, when you need it.  We recommend signing up for the patient portal called "MyChart".  Sign up information is provided on this After Visit Summary.  MyChart is used to connect with patients for Virtual Visits (Telemedicine).  Patients are able to view lab/test results, encounter notes, upcoming appointments, etc.  Non-urgent messages can be sent to your provider as well.   To learn more about what you can do with MyChart, go to [email or web address].    Your next appointment:   6 month(s)  The format for your next appointment:   In  Person  Provider:   [name], MD   Other Instructions None Today        Signed, Erma Heritage, PA-C  02/27/2020 8:44 AM    [facility] [address] [place], [place] [address] Phone: [phone] Fax: [phone]

## 2020-02-26 NOTE — Patient Instructions (Signed)
Medication Instructions:  None Today *If you need a refill on your cardiac medications before your next appointment, please call your pharmacy*   Lab Work: None Today If you have labs (blood work) drawn today and your tests are completely normal, you will receive your results only by:  [place] (if you have MyChart) OR  A paper copy in the mail If you have any lab test that is abnormal or we need to change your treatment, we will call you to review the results.   Testing/Procedures: None Today   Follow-Up: At [facility], you and your health needs are our priority.  As part of our continuing mission to provide you with exceptional heart care, we have created designated Provider Care Teams.  These Care Teams include your primary Cardiologist (physician) and Advanced Practice Providers (APPs -  Physician Assistants and Nurse Practitioners) who all work together to provide you with the care you need, when you need it.  We recommend signing up for the patient portal called "MyChart".  Sign up information is provided on this After Visit Summary.  MyChart is used to connect with patients for Virtual Visits (Telemedicine).  Patients are able to view lab/test results, encounter notes, upcoming appointments, etc.  Non-urgent messages can be sent to your provider as well.   To learn more about what you can do with MyChart, go to [email or web address].    Your next appointment:   6 month(s)  The format for your next appointment:   In Person  Provider:   [name], MD   Other Instructions None Today

## 2020-02-27 ENCOUNTER — Encounter: Payer: Self-pay | Admitting: Student

## 2020-03-25 ENCOUNTER — Ambulatory Visit (INDEPENDENT_AMBULATORY_CARE_PROVIDER_SITE_OTHER): Payer: Medicare Other

## 2020-03-25 DIAGNOSIS — I428 Other cardiomyopathies: Secondary | ICD-10-CM | POA: Diagnosis not present

## 2020-03-27 LAB — CUP PACEART REMOTE DEVICE CHECK
Battery Remaining Longevity: 11 mo
Battery Remaining Percentage: 10 %
Battery Voltage: 2.68 V
Brady Statistic RV Percent Paced: 1 %
Date Time Interrogation Session: 20220112031550
HighPow Impedance: 78 Ohm
HighPow Impedance: 78 Ohm
Implantable Lead Implant Date: 20130729
Implantable Lead Location: 753860
Implantable Lead Model: 181
Implantable Lead Serial Number: 322336
Implantable Pulse Generator Implant Date: 20130729
Lead Channel Impedance Value: 590 Ohm
Lead Channel Pacing Threshold Amplitude: 0.75 V
Lead Channel Pacing Threshold Pulse Width: 0.5 ms
Lead Channel Sensing Intrinsic Amplitude: 10.5 mV
Lead Channel Setting Pacing Amplitude: 2.5 V
Lead Channel Setting Pacing Pulse Width: 0.5 ms
Lead Channel Setting Sensing Sensitivity: 0.5 mV
Pulse Gen Serial Number: 1022442

## 2020-04-08 NOTE — Progress Notes (Signed)
Remote ICD transmission.   

## 2020-04-10 ENCOUNTER — Other Ambulatory Visit: Payer: Medicare Other

## 2020-04-10 ENCOUNTER — Encounter (HOSPITAL_COMMUNITY): Payer: Self-pay | Admitting: *Deleted

## 2020-04-10 ENCOUNTER — Emergency Department (HOSPITAL_COMMUNITY): Payer: Medicare Other

## 2020-04-10 ENCOUNTER — Other Ambulatory Visit: Payer: Self-pay

## 2020-04-10 ENCOUNTER — Emergency Department (HOSPITAL_COMMUNITY)
Admission: EM | Admit: 2020-04-10 | Discharge: 2020-04-10 | Disposition: A | Discharge status: Home / Self Care | Payer: Medicare Other | Attending: Emergency Medicine | Admitting: Emergency Medicine

## 2020-04-10 DIAGNOSIS — I5022 Chronic systolic (congestive) heart failure: Secondary | ICD-10-CM | POA: Diagnosis not present

## 2020-04-10 DIAGNOSIS — Z20822 Contact with and (suspected) exposure to covid-19: Secondary | ICD-10-CM

## 2020-04-10 DIAGNOSIS — E039 Hypothyroidism, unspecified: Secondary | ICD-10-CM | POA: Diagnosis not present

## 2020-04-10 DIAGNOSIS — Z95810 Presence of automatic (implantable) cardiac defibrillator: Secondary | ICD-10-CM | POA: Insufficient documentation

## 2020-04-10 DIAGNOSIS — Z79899 Other long term (current) drug therapy: Secondary | ICD-10-CM | POA: Insufficient documentation

## 2020-04-10 DIAGNOSIS — Z8673 Personal history of transient ischemic attack (TIA), and cerebral infarction without residual deficits: Secondary | ICD-10-CM | POA: Diagnosis not present

## 2020-04-10 DIAGNOSIS — Z7952 Long term (current) use of systemic steroids: Secondary | ICD-10-CM | POA: Insufficient documentation

## 2020-04-10 DIAGNOSIS — R059 Cough, unspecified: Secondary | ICD-10-CM | POA: Diagnosis present

## 2020-04-10 DIAGNOSIS — F17210 Nicotine dependence, cigarettes, uncomplicated: Secondary | ICD-10-CM | POA: Diagnosis not present

## 2020-04-10 DIAGNOSIS — I110 Hypertensive heart disease with heart failure: Secondary | ICD-10-CM | POA: Insufficient documentation

## 2020-04-10 DIAGNOSIS — J45909 Unspecified asthma, uncomplicated: Secondary | ICD-10-CM | POA: Insufficient documentation

## 2020-04-10 DIAGNOSIS — J449 Chronic obstructive pulmonary disease, unspecified: Secondary | ICD-10-CM | POA: Insufficient documentation

## 2020-04-10 DIAGNOSIS — Z7982 Long term (current) use of aspirin: Secondary | ICD-10-CM | POA: Insufficient documentation

## 2020-04-10 MED ORDER — AZITHROMYCIN 250 MG PO TABS
250.0000 mg | ORAL_TABLET | Freq: Every day | ORAL | 0 refills | Status: DC
Start: 2020-04-10 — End: 2022-03-01

## 2020-04-10 MED ORDER — BENZONATATE 100 MG PO CAPS
100.0000 mg | ORAL_CAPSULE | Freq: Three times a day (TID) | ORAL | 0 refills | Status: DC
Start: 2020-04-10 — End: 2022-03-01

## 2020-04-10 NOTE — ED Triage Notes (Signed)
Pt with cough since Monday, productive cough.  Pt concerned about having PNA

## 2020-04-10 NOTE — Discharge Instructions (Signed)
Follow-up with your Covid test they had performed outpatient  Return for new or worsening symptoms  Your chest x-ray did not show pneumonia however given you had yellow sputum with your cough we are treating for it given your Covid test is pending.  If your COVID test returns positive you may stop taking the antibiotics

## 2020-04-10 NOTE — ED Provider Notes (Addendum)
[facility] EMERGENCY DEPARTMENT Provider Note   CSN: [id number] Arrival date & time: 04/10/20  1242     History Chief Complaint  Patient presents with  . Cough    Melanie Cordova is a 60 y.o. female with history significant for CHF, COPD, hypertension, hyperlipidemia, ICD who presents for evaluation of cough.  Cough began 5 days ago.  Productive of dark yellow sputum.  No hemoptysis.  Vaccinated against [place].  Did have exposure approximately 1 week to 10 days ago.  She denies fever, chills, nausea, vomiting, headache, lightness, dizziness, chest pain, shortness of breath abdominal pain, diarrhea, dysuria.  She denies any swelling in her lower extremities, redness or warmth.  Denies additional aggravating or alleviating factors.  History obtained from patient and past medical records. No interpretor was used.   HPI     Past Medical History:  Diagnosis Date  . Anxiety   . Arthritis   . Asthma   . Automatic implantable cardioverter-defibrillator in situ    2013, july  . Bell palsy    states has had 3 episodes  . Cardiomyopathy 08/2010   Presented with congestive heart failure; EF of 15% and 2012; hypotension on medication precludes optimal dosing  . CHF (congestive heart failure) ([place])    a. EF 20% in 2012 with low-risk NST --> St. Jude ICD implantation by Dr. Lovena Le in 09/2011  . Chronic systolic heart failure ([place])   . COPD (chronic obstructive pulmonary disease) ([place])    2013  . Dysrhythmia   . Gastroesophageal reflux disease   . Headache(784.0)   . Hot flashes 10/04/2016  . Hyperlipidemia   . Hypertension    09/2010-normal CMet and CBC; Lipid profile-116, 88, 25, 73  . Hypothyroidism    Recent TSH was normal.  . 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 Neuromuscular disorder (HCC)    Neuropathy, right foot and leg  . Neuropathy   . Obesity   . Pneumonia   . Seizures ([place])    last one at age 55  . Shortness of breath   . Stroke [facility])      stroke in 07/2012 and another in June, 2014  . Tobacco abuse    20 pack years    Patient Active Problem List   Diagnosis Date Noted  . Colorectal polyps   . History of colonic polyps 02/20/2019  . Constipation 02/20/2019  . Chest pain 12/28/2017  . Rectocele 10/04/2016  . Vaginal discharge 10/04/2016  . Screening for colorectal cancer 10/04/2016  . Hot flashes 10/04/2016  . Vitamin D deficiency 02/26/2016  . Cryptogenic stroke ([place]) 08/13/2015  . Weakness of face muscles 07/25/2015  . Obesity, Class III, BMI 40-49.9 (morbid obesity) ([place]) 03/05/2015  . Rectal bleeding 11/05/2013  . Chronic systolic heart failure ([place]) 05/15/2013  . Asthma 04/29/2013  . Migraine variant 11/24/2012  . Weakness 11/15/2012  . Gingival disease 07/25/2012  . TIA (transient ischemic attack) s/p tPA 11/07/2011  . Automatic implantable cardioverter-defibrillator in situ 10/12/2011  . Hypokalemia 10/01/2011  . Tobacco abuse   . Hyperlipidemia   . Hypertension   . Gastroesophageal reflux disease   . Hypothyroidism   . Cardiomyopathy ([place]) 08/13/2010    Past Surgical History:  Procedure Laterality Date  . CESAREAN SECTION     X2  . COLONOSCOPY N/A 12/16/2013   One simple adenoma and 3 hyperplastic polyps. Small internal hemorrhoids. Left colon redundant. Due for surveillance 2020.   Marland Kitchen COLONOSCOPY WITH PROPOFOL N/A 06/04/2019  Procedure: COLONOSCOPY WITH PROPOFOL;  Surgeon: Danie Binder, MD;  [facility] END[facility];  Service: Endoscopy;  Laterality: N/A;  10:30am  . EP IMPLANTABLE DEVICE     St. Jude  . EP IMPLANTABLE DEVICE N/A 08/13/2015   Procedure: Loop Recorder Insertion;  Surgeon: Evans Lance, MD;  Location: [place] CV LAB;  Service: Cardiovascular;  Laterality: N/A;  . IMPLANTABLE CARDIOVERTER DEFIBRILLATOR IMPLANT N/A 10/10/2011   Procedure: IMPLANTABLE CARDIOVERTER DEFIBRILLATOR IMPLANT;  Surgeon: Evans Lance, MD;  Location: [facility] CATH LAB;  Service: Cardiovascular;  Laterality:  N/A;  . MULTIPLE EXTRACTIONS WITH ALVEOLOPLASTY N/A 09/24/2012   Procedure: MULTIPLE EXTRACION #2, 4, 6, 7 ,8, 9, 11, 13, 18, 20, 21, 22, 23, 24, 25, 26, 27, 29 WITH ALVEOLOPLASTY, BIOPSY OF PALATE LESION, REMOVA RIGHT LINGUAL TORUS;  Surgeon: Gae Bon, DDS;  Location: [place];  Service: Oral Surgery;  Laterality: N/A;  . POLYPECTOMY  06/04/2019   Procedure: POLYPECTOMY;  Surgeon: Danie Binder, MD;  [facility];  Service: Endoscopy;;  . TEE WITHOUT CARDIOVERSION N/A 07/11/2012   Procedure: TRANSESOPHAGEAL ECHOCARDIOGRAM (TEE);  Surgeon: Thayer Headings, MD;  Location: [facility] ENDOSCOPY;  Service: Cardiovascular;  Laterality: N/A;  . TUBAL LIGATION       OB History    Gravida  3   Para  3   Term  3   Preterm      AB      Living  3     SAB      IAB      Ectopic      Multiple      Live Births  3           Family History  Problem Relation Age of Onset  . Cardiomyopathy Mother        ICD pacemaker-ischmic CM  . Heart failure Mother   . Hypertension Mother   . Diabetes Mother   . Thyroid disease Mother   . Cardiomyopathy Father        Deceased  . Coronary artery disease Father   . Heart failure Father   . Heart failure Brother   . Hypertension Brother   . Endometriosis Sister   . Heart disease Paternal Grandfather   . Heart disease Paternal Grandmother   . Heart disease Maternal Grandmother   . Heart disease Maternal Grandfather   . Hypertension Daughter   . Obesity Daughter   . Colon cancer Neg Hx   . Liver disease Neg Hx     Social History   Tobacco Use  . Smoking status: Current Every Day Smoker    Packs/day: 0.75    Years: 21.00    Pack years: 15.75    Types: Cigarettes    Start date: 07/02/1983  . Smokeless tobacco: Never Used  Vaping Use  . Vaping Use: Former  Substance Use Topics  . Alcohol use: No    Alcohol/week: 0.0 standard drinks  . Drug use: No    Home Medications Prior to Admission medications   Medication Sig Start  Date End Date Taking? Authorizing Provider  azithromycin (ZITHROMAX) 250 MG tablet Take 1 tablet (250 mg total) by mouth daily. Take first 2 tablets together, then 1 every day until finished. 04/10/20  Yes Derotha Fishbaugh A, PA-C  benzonatate (TESSALON) 100 MG capsule Take 1 capsule (100 mg total) by mouth every 8 (eight) hours. 04/10/20  Yes Kimberlee Shoun A, PA-C  acetaminophen (TYLENOL) 650 MG CR tablet Take 650 mg  by mouth every 8 (eight) hours as needed for pain.    [provider]  albuterol (PROVENTIL HFA;VENTOLIN HFA) 108 (90 BASE) MCG/ACT inhaler Inhale 2 puffs into the lungs every 6 (six) hours as needed for wheezing or shortness of breath. 07/12/12   Trinda Pascal, MD  aspirin EC 81 MG tablet Take 1 tablet (81 mg total) by mouth daily. 07/03/19   Donzetta Starch, NP  Biotin 10000 MCG TABS Take 10,000 mcg by mouth daily.     [provider]  budesonide-formoterol (SYMBICORT) 80-4.5 MCG/ACT inhaler Inhale 2 puffs into the lungs 2 (two) times daily.    [provider]  carvedilol (COREG) 12.5 MG tablet Take 25 mg by mouth 2 (two) times daily with a meal.    [provider]  cetirizine (ZYRTEC) 10 MG tablet Take 10 mg by mouth daily.    [provider]  Cholecalciferol (VITAMIN D3) 5000 units CAPS TAKE ONE CAPSULE BY MOUTH DAILY 09/22/17   [name], MD  clobetasol (TEMOVATE) 0.05 % external solution Apply 1 application topically 2 (two) times daily as needed (Apply to scalp.).  07/24/15   [provider]  clopidogrel (PLAVIX) 75 MG tablet Take 1 tablet (75 mg total) by mouth daily. 07/03/19   Rosalin Hawking, MD  diclofenac sodium (VOLTAREN) 1 % GEL Apply 2 g topically 4 (four) times daily as needed (pain).     [provider]  diphenhydrAMINE (BENADRYL) 25 MG tablet Take 25 mg by mouth in the morning and at bedtime.     [provider]  ENTRESTO 24-26 MG TAKE ONE TABLET BY MOUTH 2 TIMES A DAY Patient taking  differently: Take 1 tablet by mouth 2 (two) times daily. 12/31/18   Evans Lance, MD  famotidine (PEPCID) 20 MG tablet Take 20 mg by mouth daily as needed for heartburn or indigestion.    [provider]  Ferrous Sulfate (IRON) 28 MG TABS Take 28 mg by mouth daily.     [provider]  fluticasone (FLONASE) 50 MCG/ACT nasal spray Place 2 sprays into both nostrils daily.    [provider]  furosemide (LASIX) 40 MG tablet Take 1 tablet (40 mg total) by mouth 2 (two) times daily. 08/29/12   Yehuda Savannah, MD  ketoconazole (NIZORAL) 2 % cream Apply 1 application topically 2 (two) times daily.  10/21/15   [provider]  levothyroxine (SYNTHROID, LEVOTHROID) 200 MCG tablet Take 1 tablet (200 mcg total) by mouth daily before breakfast. 03/01/18   Nida, Marella Chimes, MD  LINZESS 145 MCG CAPS capsule Take 145 mcg by mouth daily. 02/12/20   [provider]  Multiple Vitamins-Minerals (CENTRAVITES 50 PLUS PO) Take 1 tablet by mouth daily.     [provider]  pantoprazole (PROTONIX) 20 MG tablet Take 1 tablet (20 mg total) by mouth daily. 06/21/18   Strader, Fransisco Hertz, PA-C  potassium chloride SA (K-DUR) 20 MEQ tablet Take 20 mEq by mouth daily.  05/23/18   [provider]  psyllium (METAMUCIL) 58.6 % packet Take 1 packet by mouth daily.    [provider]  rosuvastatin (CRESTOR) 20 MG tablet Take 20 mg by mouth daily.    [provider]  Simethicone (GAS-X PO) Take 1 tablet by mouth in the morning and at bedtime.     [provider]  topiramate (TOPAMAX) 25 MG tablet Take 3 tablets at bedtime 12/05/19   Suzzanne Cloud, NP  Vitamin D,  Ergocalciferol, (DRISDOL) 1.25 MG (50000 UNIT) CAPS capsule Take 50,000 Units by mouth every 30 (thirty) days.    [provider]    Allergies    Fish allergy, Iodinated diagnostic agents, Iodine, [place], Penicillins, Lipitor [atorvastatin], Spironolactone, and Sulfa  antibiotics  Review of Systems   Review of Systems  Constitutional: Negative for activity change, appetite change, chills, diaphoresis, fatigue, fever and unexpected weight change.  HENT: Negative.  Negative for ear pain and sore throat.   Eyes: Negative for pain and visual disturbance.  Respiratory: Positive for cough. Negative for shortness of breath.   Cardiovascular: Negative.  Negative for chest pain, palpitations and leg swelling.  Gastrointestinal: Negative.  Negative for abdominal pain and vomiting.  Genitourinary: Negative.  Negative for dysuria and hematuria.  Musculoskeletal: Negative.  Negative for arthralgias and back pain.  Skin: Negative.  Negative for color change and rash.  Neurological: Negative.  Negative for seizures and syncope.  All other systems reviewed and are negative.  Physical Exam Updated Vital Signs BP 122/74 (BP Location: Right Arm)   Pulse 79   Temp 97.8 F (36.6 C) (Oral)   Resp (!) 22   Ht 5\' 7"  (1.702 m)   Wt 121.6 kg   SpO2 98%   BMI 41.97 kg/m   Physical Exam Vitals and nursing note reviewed.  Constitutional:      General: She is not in acute distress.    Appearance: She is well-developed and well-nourished. She is obese. She is not ill-appearing, toxic-appearing or diaphoretic.  HENT:     Head: Normocephalic and atraumatic.     Nose: Nose normal.     Mouth/Throat:     Mouth: Mucous membranes are moist.  Eyes:     Pupils: Pupils are equal, round, and reactive to light.  Cardiovascular:     Rate and Rhythm: Normal rate.     Pulses: Normal pulses and intact distal pulses.     Heart sounds: Normal heart sounds.  Pulmonary:     Effort: Pulmonary effort is normal. No respiratory distress.     Comments: Mild coarse breath sounds, clears with coughing.  No respiratory distress.  Speaks in full sentences without difficulty. Chest:     Comments: Pacemaker to LU chest wall without erythema, warmth Abdominal:     General: Bowel sounds are  normal. There is no distension.     Palpations: There is no mass.     Tenderness: There is no abdominal tenderness. There is no right CVA tenderness, left CVA tenderness, guarding or rebound.     Hernia: No hernia is present.     Comments: Soft, nontender without rebound or guarding  Musculoskeletal:        General: No swelling, tenderness, deformity or signs of injury. Normal range of motion.     Cervical back: Normal range of motion.     Right lower leg: No edema.     Left lower leg: No edema.     Comments: Moves all 4 extremities at difficulty.  Compartments soft.  Skin:    General: Skin is warm and dry.     Capillary Refill: Capillary refill takes less than 2 seconds.     Comments: No edema, erythema or warmth  Neurological:     General: No focal deficit present.     Mental Status: She is alert and oriented to person, place, and time.  Psychiatric:        Mood and Affect: Mood and affect normal.    ED  Results / Procedures / Treatments   Labs (all labs ordered are listed, but only abnormal results are displayed) Labs Reviewed - No data to display  EKG None  Radiology DG Chest Portable 1 View  Result Date: 04/10/2020 CLINICAL DATA:  Cough EXAM: PORTABLE CHEST 1 VIEW COMPARISON:  September 09, 2019 FINDINGS: Lungs are clear. Heart size and pulmonary vascularity are normal. Pacemaker lead is attached to the right ventricle. Apparent loop recorder on the left. No adenopathy. There is aortic atherosclerosis. No bone lesions. IMPRESSION: Lungs clear. Stable cardiac silhouette. Pacemaker lead attached to right ventricle. Aortic Atherosclerosis (ICD10-I70.0). Electronically Signed   By: Lowella Grip III M.D.   On: 04/10/2020 13:19    Procedures Procedures   Medications Ordered in ED Medications - No data to display  ED Course  I have reviewed the triage vital signs and the nursing notes.  Pertinent labs & imaging results that were available during my care of the patient were  reviewed by me and considered in my medical decision making (see chart for details).  60 year old presents for evaluation of cough.  She is afebrile, nonseptic, non-ill-appearing.  Vaccinated against COVID however did have exposure 1 week to 10 days ago.  Cough productive of dark yellow sputum.  has a history of heart failure however does not appear grossly fluid overloaded.  She denies any chest pain, shortness of breath.  Her abdomen is soft, tender.  She has no hypoxia, tachycardia or tachypnea.  Did have some very minimal coarse breath sounds right lower lobes however clears with cough.  Patient had outpatient Covid test just PTA.  States she went to come here for a chest x-ray to "make sure I do not have pneumonia."  Patient appears otherwise well.  Chest x-ray obtained from triage which I personally interpreted does not show any evidence of fluid overload, infiltrates.  Patient is concerned given the yellow sputum she is developing pneumonia.  Will treat for possible CAP, given history, clinical exam.  Discussed close follow-up with her Covid test which she obtain outpatient.  She will return for any worsening symptoms.  The patient has been appropriately medically screened and/or stabilized in the ED. I have low suspicion for any other emergent medical condition which would require further screening, evaluation or treatment in the ED or require inpatient management.  Patient is hemodynamically stable and in no acute distress.  Patient able to ambulate in department prior to ED.  Evaluation does not show acute pathology that would require ongoing or additional emergent interventions while in the emergency department or further inpatient treatment.  I have discussed the diagnosis with the patient and answered all questions.  Pain is been managed while in the emergency department and patient has no further complaints prior to discharge.  Patient is comfortable with plan discussed in room and is stable for  discharge at this time.  I have discussed strict return precautions for returning to the emergency department.  Patient was encouraged to follow-up with PCP/specialist refer to at discharge.    MDM Rules/Calculators/A&P                          [name] was evaluated in Emergency Department on 04/10/2020 for the symptoms described in the history of present illness. She was evaluated in the context of the global COVID-19 pandemic, which necessitated consideration that the patient might be at risk for infection with the SARS-CoV-2 virus that causes COVID-19.  Institutional protocols and algorithms that pertain to the evaluation of patients at risk for COVID-19 are in a state of rapid change based on information released by regulatory bodies including the CDC and federal and state organizations. These policies and algorithms were followed during the patient's care in the ED. Final Clinical Impression(s) / ED Diagnoses Final diagnoses:  Cough  Person under investigation for COVID-19    Rx / DC Orders ED Discharge Orders         Ordered    azithromycin (ZITHROMAX) 250 MG tablet  Daily        04/10/20 1331    benzonatate (TESSALON) 100 MG capsule  Every 8 hours        04/10/20 1331           Barclay Lennox A, PA-C 04/10/20 1342    Moody Robben A, PA-C 04/10/20 1344    Sherwood Gambler, MD 04/13/20 [phone]

## 2020-04-12 LAB — SARS-COV-2, NAA 2 DAY TAT

## 2020-04-12 LAB — NOVEL CORONAVIRUS, NAA: SARS-CoV-2, NAA: DETECTED — AB

## 2020-05-28 ENCOUNTER — Telehealth: Payer: Self-pay | Admitting: Internal Medicine

## 2020-05-28 NOTE — Telephone Encounter (Signed)
New message   Patient called stating she received paperwork for Disability Services requesting a return to work date, she wasn't aware of Dr Lovena Le releasing her to go back to work.  She wants to speak with Dr Lovena Le regarding this.

## 2020-05-28 NOTE — Telephone Encounter (Signed)
Pt received disability papers asking her if she has had a change in medical condition, if she can return to work, and when her next procedure will be. Pt states that she does not feel she can go back to work at this time. She is unable to complete house work and she does not have a car to drive to work, she would have to walk to work.

## 2020-06-01 NOTE — Telephone Encounter (Signed)
New message    Patient called agaiin 06/01/20, she would like a response 06/02/20 so that she can get these papers returned to the Disability office asap.

## 2020-06-02 NOTE — Telephone Encounter (Signed)
Returned call to pt. No answer, left msg to call back. °

## 2020-06-03 NOTE — Telephone Encounter (Signed)
Patient returned call to [facility].

## 2020-06-03 NOTE — Telephone Encounter (Signed)
Called to notify pt. Pt voiced understanding and voiced no other questions at this time.

## 2020-06-03 NOTE — Telephone Encounter (Signed)
Returned call to pt. No answer. Left msg to call back.  

## 2020-06-03 NOTE — Telephone Encounter (Signed)
    I never took her out of work upon her initial diagnosis so would defer that to Dr. Lovena Le. Given her limited activity level and persistent symptoms, would anticipate she would still be approved. Her heart muscle was previously 20% in 2012 and was improved to 45-50% by echo in 06/2019. She has an ICD but estimated battery longevity by device interrogation in 03/2020 was 11 months.   Signed, Erma Heritage, PA-C 06/03/2020, 4:05 PM Pager: [phone]

## 2020-06-03 NOTE — Telephone Encounter (Signed)
Pt returned call and states that she knows that her change out date is near July of 2023. She just needs to know if her heart has gotten better or worse. Pt would like to know is if she has to return to work or continue to stay out. Please advise.

## 2020-06-24 ENCOUNTER — Ambulatory Visit (INDEPENDENT_AMBULATORY_CARE_PROVIDER_SITE_OTHER): Payer: Medicare Other

## 2020-06-24 DIAGNOSIS — I428 Other cardiomyopathies: Secondary | ICD-10-CM

## 2020-06-24 DIAGNOSIS — I5042 Chronic combined systolic (congestive) and diastolic (congestive) heart failure: Secondary | ICD-10-CM

## 2020-06-25 LAB — CUP PACEART REMOTE DEVICE CHECK
Battery Remaining Longevity: 7 mo
Battery Remaining Percentage: 6 %
Battery Voltage: 2.63 V
Brady Statistic RV Percent Paced: 1 %
Date Time Interrogation Session: 20220413044917
HighPow Impedance: 77 Ohm
HighPow Impedance: 77 Ohm
Implantable Lead Implant Date: 20130729
Implantable Lead Location: 753860
Implantable Lead Model: 181
Implantable Lead Serial Number: 322336
Implantable Pulse Generator Implant Date: 20130729
Lead Channel Impedance Value: 650 Ohm
Lead Channel Pacing Threshold Amplitude: 0.75 V
Lead Channel Pacing Threshold Pulse Width: 0.5 ms
Lead Channel Sensing Intrinsic Amplitude: 11.8 mV
Lead Channel Setting Pacing Amplitude: 2.5 V
Lead Channel Setting Pacing Pulse Width: 0.5 ms
Lead Channel Setting Sensing Sensitivity: 0.5 mV
Pulse Gen Serial Number: 1022442

## 2020-07-07 ENCOUNTER — Telehealth: Payer: Self-pay

## 2020-07-07 NOTE — Telephone Encounter (Signed)
[place] alert received for EGM appears RV lead noise; lead trending appears stable. Similar episode noted at in clinic on 08/20/19.  Patient called and reports she is not aware of any activity that would have caused EMI. Per last in-clinic check on 08/20/19, isometrics were performed and not reproducible. Advised of shock plan. Advised I will forward for Dr. Lovena Le to review and we will call with any changes. Patient agreeable to plan.

## 2020-07-08 ENCOUNTER — Encounter (HOSPITAL_COMMUNITY): Payer: Self-pay | Admitting: Emergency Medicine

## 2020-07-08 ENCOUNTER — Telehealth: Payer: Self-pay | Admitting: Emergency Medicine

## 2020-07-08 ENCOUNTER — Other Ambulatory Visit: Payer: Self-pay

## 2020-07-08 ENCOUNTER — Emergency Department (HOSPITAL_COMMUNITY)
Admission: EM | Admit: 2020-07-08 | Discharge: 2020-07-08 | Disposition: A | Discharge status: Home / Self Care | Payer: Medicare Other | Attending: Emergency Medicine | Admitting: Emergency Medicine

## 2020-07-08 ENCOUNTER — Emergency Department (HOSPITAL_COMMUNITY): Payer: Medicare Other

## 2020-07-08 DIAGNOSIS — Z79899 Other long term (current) drug therapy: Secondary | ICD-10-CM | POA: Insufficient documentation

## 2020-07-08 DIAGNOSIS — F17210 Nicotine dependence, cigarettes, uncomplicated: Secondary | ICD-10-CM | POA: Insufficient documentation

## 2020-07-08 DIAGNOSIS — R079 Chest pain, unspecified: Secondary | ICD-10-CM | POA: Diagnosis present

## 2020-07-08 DIAGNOSIS — Z7951 Long term (current) use of inhaled steroids: Secondary | ICD-10-CM | POA: Insufficient documentation

## 2020-07-08 DIAGNOSIS — E039 Hypothyroidism, unspecified: Secondary | ICD-10-CM | POA: Insufficient documentation

## 2020-07-08 DIAGNOSIS — J449 Chronic obstructive pulmonary disease, unspecified: Secondary | ICD-10-CM | POA: Diagnosis not present

## 2020-07-08 DIAGNOSIS — Z7982 Long term (current) use of aspirin: Secondary | ICD-10-CM | POA: Insufficient documentation

## 2020-07-08 DIAGNOSIS — I5022 Chronic systolic (congestive) heart failure: Secondary | ICD-10-CM | POA: Diagnosis not present

## 2020-07-08 DIAGNOSIS — J45909 Unspecified asthma, uncomplicated: Secondary | ICD-10-CM | POA: Diagnosis not present

## 2020-07-08 DIAGNOSIS — Z95810 Presence of automatic (implantable) cardiac defibrillator: Secondary | ICD-10-CM | POA: Insufficient documentation

## 2020-07-08 DIAGNOSIS — I110 Hypertensive heart disease with heart failure: Secondary | ICD-10-CM | POA: Insufficient documentation

## 2020-07-08 DIAGNOSIS — Z7902 Long term (current) use of antithrombotics/antiplatelets: Secondary | ICD-10-CM | POA: Insufficient documentation

## 2020-07-08 LAB — BASIC METABOLIC PANEL
Anion gap: 8 (ref 5–15)
BUN: 11 mg/dL (ref 6–20)
CO2: 27 mmol/L (ref 22–32)
Calcium: 8.7 mg/dL — ABNORMAL LOW (ref 8.9–10.3)
Chloride: 104 mmol/L (ref 98–111)
Creatinine, Ser: 0.71 mg/dL (ref 0.44–1.00)
GFR, Estimated: >60 mL/min (ref >60)
Glucose, Bld: 86 mg/dL (ref 70–99)
Potassium: 3.7 mmol/L (ref 3.5–5.1)
Sodium: 139 mmol/L (ref 135–145)

## 2020-07-08 LAB — CBC
HCT: 42.6 % (ref 36.0–46.0)
Hemoglobin: 13.4 g/dL (ref 12.0–15.0)
MCH: 30.6 pg (ref 26.0–34.0)
MCHC: 31.5 g/dL (ref 30.0–36.0)
MCV: 97.3 fL (ref 80.0–100.0)
Platelets: 275 10*3/uL (ref 150–400)
RBC: 4.38 MIL/uL (ref 3.87–5.11)
RDW: 13.5 % (ref 11.5–15.5)
WBC: 10.3 10*3/uL (ref 4.0–10.5)
nRBC: 0 % (ref 0.0–0.2)

## 2020-07-08 LAB — TROPONIN I (HIGH SENSITIVITY): Troponin I (High Sensitivity): 2 ng/L (ref <18)

## 2020-07-08 LAB — MAGNESIUM: Magnesium: 2.2 mg/dL (ref 1.7–2.4)

## 2020-07-08 NOTE — ED Triage Notes (Signed)
Pt c/o cp that started 1 hour ago. Pt given 324 aspirin and 1 nitro with no relief. Pt states cardiologist called Monday and states her defib fired around 630.

## 2020-07-08 NOTE — ED Notes (Signed)
Pt has removed cardiac monitor, BP and O2 cord because she was told by EDP she was going to be discharged earlier by MD got called away for another emergent pt. Waiting on discharge papers at this time.

## 2020-07-08 NOTE — Telephone Encounter (Signed)
Roselyn Reef RN at [facility] ED made aware of patient's device schedule appointment 07/09/20 at [place]. She will ensure that patient is aware of appointment and provided with [facility] device clinic # [phone]) if she needs to reschedule appointment. Appointment to assess for cause of EMI and lead noise with St Jude present. Karilyn Cota aware of appointment.

## 2020-07-08 NOTE — ED Notes (Signed)
Repaged Cardiology to Dr Alvino Chapel @ [phone]

## 2020-07-08 NOTE — ED Provider Notes (Signed)
[facility] Provider Note   CSN: [id number] Arrival date & time: 07/08/20  1046     History Chief Complaint  Patient presents with  . Chest Pain    Melanie Cordova is a 60 y.o. female.  HPI Patient presents with chest pain.  Began earlier this morning.  Dull in the anterior chest.  No relief with nitroglycerin.  Has a known cardiomyopathy and AICD.  Yesterday evening there was some sort of electromagnetic interference and her pacemaker was involved.  States that her TV went haywire the lights flickered and then she felt something in her chest too.  Patient states she felt the shock go through her chest but does not appear as if the AICD fired.  Does not feel lightheaded or dizziness.  No fevers or chills.  No coughing.  No real swelling in her legs.  Patient states her family told her to come in here and get checked out.    Past Medical History:  Diagnosis Date  . Anxiety   . Arthritis   . Asthma   . Automatic implantable cardioverter-defibrillator in situ    2013, july  . Bell palsy    states has had 3 episodes  . Cardiomyopathy 08/2010   Presented with congestive heart failure; EF of 15% and 2012; hypotension on medication precludes optimal dosing  . CHF (congestive heart failure) ([place])    a. EF 20% in 2012 with low-risk NST --> St. Jude ICD implantation by Dr. Lovena Le in 09/2011  . Chronic systolic heart failure ([place])   . COPD (chronic obstructive pulmonary disease) ([place])    2013  . Dysrhythmia   . Gastroesophageal reflux disease   . Headache(784.0)   . Hot flashes 10/04/2016  . Hyperlipidemia   . Hypertension    09/2010-normal CMet and CBC; Lipid profile-116, 88, 25, 73  . Hypothyroidism    Recent TSH was normal.  . 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 Neuromuscular disorder (HCC)    Neuropathy, right foot and leg  . Neuropathy   . Obesity   . Pneumonia   . Seizures ([place])    last one at age 68  . Shortness of breath    . Stroke [facility])     stroke in 07/2012 and another in June, 2014  . Tobacco abuse    20 pack years    Patient Active Problem List   Diagnosis Date Noted  . Colorectal polyps   . History of colonic polyps 02/20/2019  . Constipation 02/20/2019  . Chest pain 12/28/2017  . Rectocele 10/04/2016  . Vaginal discharge 10/04/2016  . Screening for colorectal cancer 10/04/2016  . Hot flashes 10/04/2016  . Vitamin D deficiency 02/26/2016  . Cryptogenic stroke ([place]) 08/13/2015  . Weakness of face muscles 07/25/2015  . Obesity, Class III, BMI 40-49.9 (morbid obesity) ([place]) 03/05/2015  . Rectal bleeding 11/05/2013  . Chronic systolic heart failure ([place]) 05/15/2013  . Asthma 04/29/2013  . Migraine variant 11/24/2012  . Weakness 11/15/2012  . Gingival disease 07/25/2012  . TIA (transient ischemic attack) s/p tPA 11/07/2011  . Automatic implantable cardioverter-defibrillator in situ 10/12/2011  . Hypokalemia 10/01/2011  . Tobacco abuse   . Hyperlipidemia   . Hypertension   . Gastroesophageal reflux disease   . Hypothyroidism   . Cardiomyopathy ([place]) 08/13/2010    Past Surgical History:  Procedure Laterality Date  . CESAREAN SECTION     X2  . COLONOSCOPY N/A 12/16/2013   One simple adenoma and  3 hyperplastic polyps. Small internal hemorrhoids. Left colon redundant. Due for surveillance 2020.   Marland Kitchen COLONOSCOPY WITH PROPOFOL N/A 06/04/2019   Procedure: COLONOSCOPY WITH PROPOFOL;  Surgeon: Danie Binder, MD;  [facility];  Service: Endoscopy;  Laterality: N/A;  10:30am  . EP IMPLANTABLE DEVICE     St. Jude  . EP IMPLANTABLE DEVICE N/A 08/13/2015   Procedure: Loop Recorder Insertion;  Surgeon: Evans Lance, MD;  Location: [facility] CV LAB;  Service: Cardiovascular;  Laterality: N/A;  . IMPLANTABLE CARDIOVERTER DEFIBRILLATOR IMPLANT N/A 10/10/2011   Procedure: IMPLANTABLE CARDIOVERTER DEFIBRILLATOR IMPLANT;  Surgeon: Evans Lance, MD;  Location: [facility] CATH LAB;  Service:  Cardiovascular;  Laterality: N/A;  . MULTIPLE EXTRACTIONS WITH ALVEOLOPLASTY N/A 09/24/2012   Procedure: MULTIPLE EXTRACION #2, 4, 6, 7 ,8, 9, 11, 13, 18, 20, 21, 22, 23, 24, 25, 26, 27, 29 WITH ALVEOLOPLASTY, BIOPSY OF PALATE LESION, REMOVA RIGHT LINGUAL TORUS;  Surgeon: Gae Bon, DDS;  Location: [facility];  Service: Oral Surgery;  Laterality: N/A;  . POLYPECTOMY  06/04/2019   Procedure: POLYPECTOMY;  Surgeon: Danie Binder, MD;  Location: [facility];  Service: Endoscopy;;  . TEE WITHOUT CARDIOVERSION N/A 07/11/2012   Procedure: TRANSESOPHAGEAL ECHOCARDIOGRAM (TEE);  Surgeon: Thayer Headings, MD;  Location[facility] ENDOSCOPY;  Service: Cardiovascular;  Laterality: N/A;  . TUBAL LIGATION       OB History    Gravida  3   Para  3   Term  3   Preterm      AB      Living  3     SAB      IAB      Ectopic      Multiple      Live Births  3           Family History  Problem Relation Age of Onset  . Cardiomyopathy Mother        ICD pacemaker-ischmic CM  . Heart failure Mother   . Hypertension Mother   . Diabetes Mother   . Thyroid disease Mother   . Cardiomyopathy Father        Deceased  . Coronary artery disease Father   . Heart failure Father   . Heart failure Brother   . Hypertension Brother   . Endometriosis Sister   . Heart disease Paternal Grandfather   . Heart disease Paternal Grandmother   . Heart disease Maternal Grandmother   . Heart disease Maternal Grandfather   . Hypertension Daughter   . Obesity Daughter   . Colon cancer Neg Hx   . Liver disease Neg Hx     Social History   Tobacco Use  . Smoking status: Current Every Day Smoker    Packs/day: 0.75    Years: 21.00    Pack years: 15.75    Types: Cigarettes    Start date: 07/02/1983  . Smokeless tobacco: Never Used  Vaping Use  . Vaping Use: Former  Substance Use Topics  . Alcohol use: No    Alcohol/week: 0.0 standard drinks  . Drug use: No    Home Medications Prior to Admission  medications   Medication Sig Start Date End Date Taking? Authorizing Provider  acetaminophen (TYLENOL) 650 MG CR tablet Take 650 mg by mouth every 8 (eight) hours as needed for pain.   Yes [provider]  albuterol (PROVENTIL HFA;VENTOLIN HFA) 108 (90 BASE) MCG/ACT inhaler Inhale 2 puffs into the lungs every 6 (six)  hours as needed for wheezing or shortness of breath. 07/12/12  Yes Ziemer, Kelli Hope, MD  aspirin EC 81 MG tablet Take 1 tablet (81 mg total) by mouth daily. 07/03/19  Yes Donzetta Starch, NP  Biotin 10000 MCG TABS Take 10,000 mcg by mouth daily.    Yes [provider]  budesonide-formoterol (SYMBICORT) 80-4.5 MCG/ACT inhaler Inhale 2 puffs into the lungs 2 (two) times daily.   Yes [provider]  carvedilol (COREG) 12.5 MG tablet Take 25 mg by mouth 2 (two) times daily with a meal.   Yes [provider]  cetirizine (ZYRTEC) 10 MG tablet Take 10 mg by mouth daily.   Yes [provider]  Cholecalciferol (VITAMIN D3) 5000 units CAPS TAKE ONE CAPSULE BY MOUTH DAILY 09/22/17  Yes Nida, Marella Chimes, MD  clobetasol (TEMOVATE) 0.05 % external solution Apply 1 application topically 2 (two) times daily as needed (Apply to scalp.).  07/24/15  Yes [provider]  clopidogrel (PLAVIX) 75 MG tablet Take 1 tablet (75 mg total) by mouth daily. 07/03/19  Yes Rosalin Hawking, MD  diclofenac sodium (VOLTAREN) 1 % GEL Apply 2 g topically 4 (four) times daily as needed (pain).    Yes [provider]  diphenhydrAMINE (BENADRYL) 25 MG tablet Take 25 mg by mouth in the morning and at bedtime.    Yes [provider]  ENTRESTO 24-26 MG TAKE ONE TABLET BY MOUTH 2 TIMES A DAY Patient taking differently: Take 1 tablet by mouth 2 (two) times daily. 12/31/18  Yes Evans Lance, MD  famotidine (PEPCID) 20 MG tablet Take 20 mg by mouth daily as needed for heartburn or indigestion.   Yes [provider]  Ferrous Sulfate (IRON) 28 MG TABS Take  28 mg by mouth daily.    Yes [provider]  fluticasone (FLONASE) 50 MCG/ACT nasal spray Place 2 sprays into both nostrils daily.   Yes [provider]  furosemide (LASIX) 40 MG tablet Take 1 tablet (40 mg total) by mouth 2 (two) times daily. 08/29/12  Yes Rothbart, Cristopher Estimable, MD  ketoconazole (NIZORAL) 2 % cream Apply 1 application topically 2 (two) times daily.  10/21/15  Yes [provider]  levothyroxine (SYNTHROID, LEVOTHROID) 200 MCG tablet Take 1 tablet (200 mcg total) by mouth daily before breakfast. 03/01/18  Yes Nida, Marella Chimes, MD  LINZESS 145 MCG CAPS capsule Take 145 mcg by mouth daily. 02/12/20  Yes [provider]  Multiple Vitamins-Minerals (CENTRAVITES 50 PLUS PO) Take 1 tablet by mouth daily.    Yes [provider]  pantoprazole (PROTONIX) 20 MG tablet Take 1 tablet (20 mg total) by mouth daily. 06/21/18  Yes Strader, [place] M, PA-C  potassium chloride SA (K-DUR) 20 MEQ tablet Take 20 mEq by mouth daily.  05/23/18  Yes [provider]  psyllium (METAMUCIL) 58.6 % packet Take 1 packet by mouth daily.   Yes [provider]  rosuvastatin (CRESTOR) 20 MG tablet Take 20 mg by mouth daily.   Yes [provider]  Simethicone (GAS-X PO) Take 1 tablet by mouth in the morning and at bedtime.    Yes [provider]  topiramate (TOPAMAX) 25 MG tablet Take 3 tablets at bedtime 12/05/19  Yes Suzzanne Cloud, NP  Vitamin D, Ergocalciferol, (DRISDOL) 1.25 MG (50000 UNIT) CAPS capsule Take 50,000 Units by mouth every 30 (thirty) days.   Yes [provider]  azithromycin (ZITHROMAX) 250 MG tablet Take 1 tablet (250 mg total)  by mouth daily. Take first 2 tablets together, then 1 every day until finished. Patient not taking: Reported on 07/08/2020 04/10/20   Henderly, Britni A, PA-C  benzonatate (TESSALON) 100 MG capsule Take 1 capsule (100 mg total) by mouth every 8 (eight) hours. Patient not taking: Reported on  07/08/2020 04/10/20   Henderly, Britni A, PA-C    Allergies    Fish allergy, Iodinated diagnostic agents, Iodine, Lentil, Penicillins, Lipitor [atorvastatin], Spironolactone, and Sulfa antibiotics  Review of Systems   Review of Systems  Constitutional: Negative for appetite change.  HENT: Negative for congestion.   Respiratory: Negative for shortness of breath.   Cardiovascular: Positive for chest pain. Negative for palpitations.  Gastrointestinal: Negative for abdominal pain.  Musculoskeletal: Negative for back pain.  Skin: Negative for rash.  Neurological: Negative for weakness.    Physical Exam Updated Vital Signs BP (!) 119/91   Pulse 72   Resp (!) 22   Ht 5' 7.5" (1.715 m)   Wt 123.8 kg   SpO2 100%   BMI 42.13 kg/m   Physical Exam Vitals and nursing note reviewed.  HENT:     Head: Atraumatic.  Cardiovascular:     Rate and Rhythm: Normal rate and regular rhythm.  Pulmonary:     Breath sounds: No wheezing, rhonchi or rales.  Chest:     Chest wall: No tenderness.  Abdominal:     Tenderness: There is no abdominal tenderness.  Musculoskeletal:     Cervical back: Neck supple.     Right lower leg: No tenderness.     Left lower leg: No tenderness.  Skin:    General: Skin is warm.     Capillary Refill: Capillary refill takes less than 2 seconds.  Neurological:     Mental Status: She is alert and oriented to person, place, and time.     ED Results / Procedures / Treatments   Labs (all labs ordered are listed, but only abnormal results are displayed) Labs Reviewed  BASIC METABOLIC PANEL - Abnormal; Notable for the following components:      Result Value   Calcium 8.7 (*)    All other components within normal limits  CBC  MAGNESIUM  TROPONIN I (HIGH SENSITIVITY)    EKG EKG Interpretation  Date/Time:  Wednesday July 08 2020 10:55:26 EDT Ventricular Rate:  72 PR Interval:  157 QRS Duration: 127 QT Interval:  389 QTC Calculation: 426 R Axis:   -28 Text  Interpretation: Sinus rhythm LVH with secondary repolarization abnormality Baseline wander in lead(s) II III aVF Nonspecific lateral ST changes. Confirmed by Davonna Belling [phone]) on 07/08/2020 10:59:45 AM   Radiology DG Chest Portable 1 View  Result Date: 07/08/2020 CLINICAL DATA:  Chest pain. EXAM: PORTABLE CHEST 1 VIEW COMPARISON:  April 10, 2020. FINDINGS: The heart size and mediastinal contours are within normal limits. Both lungs are clear. The visualized skeletal structures are unremarkable. IMPRESSION: No active disease. Electronically Signed   By: Marijo Conception M.D.   On: 07/08/2020 11:43    Procedures Procedures   Medications Ordered in ED Medications - No data to display  ED Course  I have reviewed the triage vital signs and the nursing notes.  Pertinent labs & imaging results that were available during my care of the patient were reviewed by me and considered in my medical decision making (see chart for details).    MDM Rules/Calculators/A&P  Patient presents with chest pain.  Had AICD issue on Monday.  Some sort of electronic influence on it.  However appears to be operating appropriately now.  Did have a sinus rhythm underneath even during the episode.  Patient states that the lights were flickering and the TV went crazy during the event.  Chest pain now likely not cardiac.  EKG reassuring.  Troponin negative.  Discussed with electrophysiology.  Patient is stable for discharge.  However with some noise on the lead will have patient follow-up in the device clinic tomorrow.  Had long discussion with patient.  Will discharge home.  Final Clinical Impression(s) / ED Diagnoses Final diagnoses:  Nonspecific chest pain    Rx / DC Orders ED Discharge Orders    None       Davonna Belling, MD 07/08/20 1554

## 2020-07-08 NOTE — Telephone Encounter (Signed)
Per Charlcie Cradle PA , patient placed on device clinic schedule for 07/09/20 at 0840 to assess RV lead noise with industry present. Patient will be notified of appointment prior to discharge and call the office if she needs to reschedule the appointment. Charlcie Cradle PA  Reports she will contact [employer] rep. to notify of appointment and discuss issue.

## 2020-07-08 NOTE — Discharge Instructions (Addendum)
Follow-up at the pacemaker clinic on the 28th, tomorrow at 840.

## 2020-07-09 ENCOUNTER — Ambulatory Visit (INDEPENDENT_AMBULATORY_CARE_PROVIDER_SITE_OTHER): Payer: Medicare Other | Admitting: Emergency Medicine

## 2020-07-09 ENCOUNTER — Other Ambulatory Visit: Payer: Self-pay

## 2020-07-09 DIAGNOSIS — I255 Ischemic cardiomyopathy: Secondary | ICD-10-CM

## 2020-07-09 LAB — CUP PACEART INCLINIC DEVICE CHECK
Battery Remaining Longevity: 7 mo
Brady Statistic RV Percent Paced: 0 %
Date Time Interrogation Session: 20220428141711
HighPow Impedance: 76.5 Ohm
Implantable Lead Implant Date: 20130729
Implantable Lead Location: 753860
Implantable Lead Model: 181
Implantable Lead Serial Number: 322336
Implantable Pulse Generator Implant Date: 20130729
Lead Channel Impedance Value: 600 Ohm
Lead Channel Pacing Threshold Amplitude: 0.75 V
Lead Channel Pacing Threshold Pulse Width: 0.5 ms
Lead Channel Sensing Intrinsic Amplitude: 12 mV
Lead Channel Setting Pacing Amplitude: 2.5 V
Lead Channel Setting Pacing Pulse Width: 0.5 ms
Lead Channel Setting Sensing Sensitivity: 0.5 mV
Pulse Gen Serial Number: 1022442

## 2020-07-09 NOTE — Progress Notes (Signed)
ICD check in clinic to assess for reproducible noise and to address EMI. Isometrics negative. Industry present. Normal device function. Thresholds and sensing consistent with previous device measurements. Impedance trends stable over time. No ventricular arrhythmias. Histogram distribution appropriate for patient and level of activity. No changes made this session. Device programmed at appropriate safety margins. Estimated longevity 7 months. Pt enrolled in remote follow-up and increased to monthly today with next transmission scheduled for 07/27/20. Patient education completed including shock plan. Auditory/vibratory alert demonstrated.

## 2020-07-09 NOTE — Progress Notes (Signed)
Remote ICD transmission.   

## 2020-07-15 NOTE — Telephone Encounter (Signed)
Lead noise is concerning but the device worked appropriately. No change in treatment. If the problem persists, she will need a new lead.

## 2020-07-17 NOTE — Telephone Encounter (Signed)
Called patient to advise continue to monitor at this time but if persist with have to have lead replaced. Thanked me for phone call.

## 2020-08-03 ENCOUNTER — Ambulatory Visit (INDEPENDENT_AMBULATORY_CARE_PROVIDER_SITE_OTHER): Payer: Medicare Other | Admitting: Neurology

## 2020-08-03 ENCOUNTER — Encounter: Payer: Self-pay | Admitting: Neurology

## 2020-08-03 VITALS — BP 108/70 | HR 79 | Ht 67.0 in | Wt 255.0 lb

## 2020-08-03 DIAGNOSIS — G43809 Other migraine, not intractable, without status migrainosus: Secondary | ICD-10-CM | POA: Diagnosis not present

## 2020-08-03 MED ORDER — TOPIRAMATE 100 MG PO TABS: 100.0000 mg | ORAL_TABLET | Freq: Every day | ORAL | 1 refills | Status: DC

## 2020-08-03 NOTE — Progress Notes (Signed)
PATIENT: Melanie Cordova DOB: Apr 14, 1960  REASON FOR VISIT: follow up HISTORY FROM: patient  HISTORY OF PRESENT ILLNESS: Today 08/03/20 Melanie Cordova Is a 60 year old female with history of TIA associated with left facial droop and slurred speech in April 2021 was given tPA.  Cannot have MRI of the brain due to defibrillator placement.  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  Is on Topamax for the headaches. Still has pressure to occipital area, and neck area. Is mostly at night, this isn't new, takes Tylenol Arthritis with good benefit. Could be 2 times a week, takes Topamax 75 mg at bedtime PRN. Has 24-year old granddaughter with special needs sees daily basis, causes her a lot of stress, can be physically demanding. Claims she falls asleep but doesn't stay asleep. Doesn't snore. Claims have been checked for sleep apnea, was negative reportedly. Is on full disability. Remains on aspirin and Plavix. Claims EF has improved to 50%. Has not had any further headaches of left sided weakness. Her medications are delivered, doesn't drive. Has a nurse who comes out 3 times week, drives her around. Topamax has worked well for her, no adverse effects. Here today alone.   Update 12/05/2019 SS: Melanie Cordova is a 60 year old female with history of TIA associated with left facial droop and slurred speech on July 02, 2019.  She was given TPA.  She has a defibrillator, cannot have MRI.  CT scan of the brain did not show any acute stroke event.  She has history of migraines, she did have a headache with this event, she may have had a migraine with hemiplegic features (confirms history hemiplegic migraines).  She remains on aspirin at this point and Plavix. She has been started on Topamax 75 mg at bedtime.  She has history of seizures as a child, was previously treated with phenobarbital and Dilantin, no seizures since her 12s, has come off seizure medications.  Headaches much improved with Topamax, was having  2-3 a week, now 1 weekly, will lay down to relieve headache. Is under more stress, her daughter and granddaughter moved in with her into her 1-bedroom apartment.  She is smoking 1/2 pk cigarettes daily, does not drive.  Here today for follow-up unaccompanied, using cane.   HISTORY  09/02/2019 Dr. Jannifer Franklin: Melanie Cordova is a 60 year old left-handed white female with a history of obesity, cardiomyopathy with congestive heart failure, tobacco abuse, dyslipidemia, and a history of hypertension.  The patient has a defibrillator in place, she cannot have MRI evaluation of the brain.  The patient was admitted to the hospital on 02 July 2019.  She presented with onset of some chest discomfort and left arm pain the day prior, she was noted to have some left facial droop and slurred speech in the emergency room and was given TPA.  She was transferred to [facility] for further evaluation.  The patient claims that she also had numbness on the left face, arm, and leg with weakness on that side as well.  She indicates a left occipital headache associated with photophobia without phonophobia and some nausea without vomiting.  The patient was on aspirin prior to the hospitalization.  She was converted to aspirin and Plavix for 3 weeks and then converted back to aspirin.  CT scan evaluation of the brain on 2 occasions did not show any acute stroke event.  The patient claims that she had focal symptoms for greater than 1 day.  By history, the patient has had severe migraine  headache in the past.  She claims that when she was a child she would have very severe headaches that would also be associated with left-sided numbness and weakness.  She denies any family history of headache.  With the above TIA event, she noted some blurring of vision without loss of vision.  She denied any problems with swallowing.  The patient has been using a cane for ambulation for about 10 years.  She denies any falls or blackouts.  She underwent a  carotid Doppler study that was unremarkable, and a 2D echocardiogram showed an ejection fraction of 45 to 50%.  Her hemoglobin A1c was 5.7, her LDL cholesterol was 77.  The patient does note urinary frequency and some incontinence, she has a lot of gas in her stomach.  She comes to the office today for further evaluation.  REVIEW OF SYSTEMS: Out of a complete 14 system review of symptoms, the patient complains only of the following symptoms, and all other reviewed systems are negative.  Headache  ALLERGIES: Allergies  Allergen Reactions  . Fish Allergy Anaphylaxis  . Iodinated Diagnostic Agents Hives and Other (See Comments)    Pulmonary problems; no frank respiratory arrest  . Iodine Hives and Other (See Comments)    Pulmonary Problems   . Lentil Anaphylaxis  . Penicillins Anaphylaxis and Shortness Of Breath    Has patient had a PCN reaction causing immediate rash, facial/tongue/throat swelling, SOB or lightheadedness with hypotension: Yes Has patient had a PCN reaction causing severe rash involving mucus membranes or skin necrosis: No Has patient had a PCN reaction that required hospitalization No Has patient had a PCN reaction occurring within the last 10 years: No If all of the above answers are "NO", then may proceed with Cephalosporin use.  Hair loss  . Lipitor [Atorvastatin] Itching and Rash  . Spironolactone Rash  . Sulfa Antibiotics Other (See Comments)    Unknown    HOME MEDICATIONS: Outpatient Medications Prior to Visit  Medication Sig Dispense Refill  . acetaminophen (TYLENOL) 650 MG CR tablet Take 650 mg by mouth every 8 (eight) hours as needed for pain.    Marland Kitchen albuterol (PROVENTIL HFA;VENTOLIN HFA) 108 (90 BASE) MCG/ACT inhaler Inhale 2 puffs into the lungs every 6 (six) hours as needed for wheezing or shortness of breath. 1 Inhaler 5  . aspirin EC 81 MG tablet Take 1 tablet (81 mg total) by mouth daily. 21 tablet 0  . azithromycin (ZITHROMAX) 250 MG tablet Take 1  tablet (250 mg total) by mouth daily. Take first 2 tablets together, then 1 every day until finished. 6 tablet 0  . benzonatate (TESSALON) 100 MG capsule Take 1 capsule (100 mg total) by mouth every 8 (eight) hours. 21 capsule 0  . Biotin 10000 MCG TABS Take 10,000 mcg by mouth daily.     . budesonide-formoterol (SYMBICORT) 80-4.5 MCG/ACT inhaler Inhale 2 puffs into the lungs 2 (two) times daily.    . carvedilol (COREG) 12.5 MG tablet Take 25 mg by mouth 2 (two) times daily with a meal.    . cetirizine (ZYRTEC) 10 MG tablet Take 10 mg by mouth daily.    . Cholecalciferol (VITAMIN D3) 5000 units CAPS TAKE ONE CAPSULE BY MOUTH DAILY 90 capsule 0  . clobetasol (TEMOVATE) 0.05 % external solution Apply 1 application topically 2 (two) times daily as needed (Apply to scalp.).     Marland Kitchen clopidogrel (PLAVIX) 75 MG tablet Take 1 tablet (75 mg total) by mouth daily. 21 tablet 0  .  diclofenac sodium (VOLTAREN) 1 % GEL Apply 2 g topically 4 (four) times daily as needed (pain).     Marland Kitchen diphenhydrAMINE (BENADRYL) 25 MG tablet Take 25 mg by mouth in the morning and at bedtime.     Marland Kitchen ENTRESTO 24-26 MG TAKE ONE TABLET BY MOUTH 2 TIMES A DAY (Patient taking differently: Take 1 tablet by mouth 2 (two) times daily.) 60 tablet 0  . famotidine (PEPCID) 20 MG tablet Take 20 mg by mouth daily as needed for heartburn or indigestion.    . Ferrous Sulfate (IRON) 28 MG TABS Take 28 mg by mouth daily.     . fluticasone (FLONASE) 50 MCG/ACT nasal spray Place 2 sprays into both nostrils daily.    . furosemide (LASIX) 40 MG tablet Take 1 tablet (40 mg total) by mouth 2 (two) times daily. 180 tablet 0  . ketoconazole (NIZORAL) 2 % cream Apply 1 application topically 2 (two) times daily.     Marland Kitchen levothyroxine (SYNTHROID, LEVOTHROID) 200 MCG tablet Take 1 tablet (200 mcg total) by mouth daily before breakfast. 90 tablet 2  . LINZESS 145 MCG CAPS capsule Take 145 mcg by mouth daily.    . Multiple Vitamins-Minerals (CENTRAVITES 50 PLUS PO)  Take 1 tablet by mouth daily.     . pantoprazole (PROTONIX) 20 MG tablet Take 1 tablet (20 mg total) by mouth daily. 90 tablet 1  . potassium chloride SA (K-DUR) 20 MEQ tablet Take 20 mEq by mouth daily.     . psyllium (METAMUCIL) 58.6 % packet Take 1 packet by mouth daily.    . rosuvastatin (CRESTOR) 20 MG tablet Take 20 mg by mouth daily.    . Simethicone (GAS-X PO) Take 1 tablet by mouth in the morning and at bedtime.     . Vitamin D, Ergocalciferol, (DRISDOL) 1.25 MG (50000 UNIT) CAPS capsule Take 50,000 Units by mouth every 30 (thirty) days.    Marland Kitchen topiramate (TOPAMAX) 25 MG tablet Take 3 tablets at bedtime 90 tablet 11   No facility-administered medications prior to visit.    PAST MEDICAL HISTORY: Past Medical History:  Diagnosis Date  . Anxiety   . Arthritis   . Asthma   . Automatic implantable cardioverter-defibrillator in situ    2013, july  . Bell palsy    states has had 3 episodes  . Cardiomyopathy 08/2010   Presented with congestive heart failure; EF of 15% and 2012; hypotension on medication precludes optimal dosing  . CHF (congestive heart failure) ([place])    a. EF 20% in 2012 with low-risk NST --> St. Jude ICD implantation by Dr. Lovena Le in 09/2011  . Chronic systolic heart failure ([place])   . COPD (chronic obstructive pulmonary disease) ([place])    2013  . Dysrhythmia   . Gastroesophageal reflux disease   . Headache(784.0)   . Hot flashes 10/04/2016  . Hyperlipidemia   . Hypertension    09/2010-normal CMet and CBC; Lipid profile-116, 88, 25, 73  . Hypothyroidism    Recent TSH was normal.  . 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 Neuromuscular disorder (HCC)    Neuropathy, right foot and leg  . Neuropathy   . Obesity   . Pneumonia   . Seizures ([place])    last one at age 25  . Shortness of breath   . Stroke [facility])     stroke in 07/2012 and another in June, 2014  . Tobacco abuse    20 pack years    PAST  SURGICAL HISTORY: Past Surgical History:   Procedure Laterality Date  . CESAREAN SECTION     X2  . COLONOSCOPY N/A 12/16/2013   One simple adenoma and 3 hyperplastic polyps. Small internal hemorrhoids. Left colon redundant. Due for surveillance 2020.   Marland Kitchen COLONOSCOPY WITH PROPOFOL N/A 06/04/2019   Procedure: COLONOSCOPY WITH PROPOFOL;  Surgeon: Danie Binder, MD;  [facility];  Service: Endoscopy;  Laterality: N/A;  10:30am  . EP IMPLANTABLE DEVICE     St. Jude  . EP IMPLANTABLE DEVICE N/A 08/13/2015   Procedure: Loop Recorder Insertion;  Surgeon: Evans Lance, MD;  Location: [place] CV LAB;  Service: Cardiovascular;  Laterality: N/A;  . IMPLANTABLE CARDIOVERTER DEFIBRILLATOR IMPLANT N/A 10/10/2011   Procedure: IMPLANTABLE CARDIOVERTER DEFIBRILLATOR IMPLANT;  Surgeon: Evans Lance, MD;  Location: [facility] CATH LAB;  Service: Cardiovascular;  Laterality: N/A;  . MULTIPLE EXTRACTIONS WITH ALVEOLOPLASTY N/A 09/24/2012   Procedure: MULTIPLE EXTRACION #2, 4, 6, 7 ,8, 9, 11, 13, 18, 20, 21, 22, 23, 24, 25, 26, 27, 29 WITH ALVEOLOPLASTY, BIOPSY OF PALATE LESION, REMOVA RIGHT LINGUAL TORUS;  Surgeon: Gae Bon, DDS;  Location: [place];  Service: Oral Surgery;  Laterality: N/A;  . POLYPECTOMY  06/04/2019   Procedure: POLYPECTOMY;  Surgeon: Danie Binder, MD;  Location: [facility];  Service: Endoscopy;;  . TEE WITHOUT CARDIOVERSION N/A 07/11/2012   Procedure: TRANSESOPHAGEAL ECHOCARDIOGRAM (TEE);  Surgeon: Thayer Headings, MD;  Location: [place];  Service: Cardiovascular;  Laterality: N/A;  . TUBAL LIGATION      FAMILY HISTORY: Family History  Problem Relation Age of Onset  . Cardiomyopathy Mother        ICD pacemaker-ischmic CM  . Heart failure Mother   . Hypertension Mother   . Diabetes Mother   . Thyroid disease Mother   . Cardiomyopathy Father        Deceased  . Coronary artery disease Father   . Heart failure Father   . Heart failure Brother   . Hypertension Brother   . Endometriosis Sister   . Heart  disease Paternal Grandfather   . Heart disease Paternal Grandmother   . Heart disease Maternal Grandmother   . Heart disease Maternal Grandfather   . Hypertension Daughter   . Obesity Daughter   . Colon cancer Neg Hx   . Liver disease Neg Hx     SOCIAL HISTORY: Social History   Socioeconomic History  . Marital status: Divorced    Spouse name: Not on file  . Number of children: 2  . Years of education: Not on file  . Highest education level: Not on file  Occupational History    Employer: [employer]    Comment: Works in Environmental consultant  Tobacco Use  . Smoking status: Current Every Day Smoker    Packs/day: 0.75    Years: 21.00    Pack years: 15.75    Types: Cigarettes    Start date: 07/02/1983  . Smokeless tobacco: Never Used  Vaping Use  . Vaping Use: Former  Substance and Sexual Activity  . Alcohol use: No    Alcohol/week: 0.0 standard drinks  . Drug use: No  . Sexual activity: Not Currently    Birth control/protection: Abstinence, Surgical    Comment: tubal  Other Topics Concern  . Not on file  Social History Narrative  . Not on file   Social Determinants of Health   Financial Resource Strain: Not on file  Food  Insecurity: Not on file  Transportation Needs: Not on file  Physical Activity: Not on file  Stress: Not on file  Social Connections: Not on file  Intimate Partner Violence: Not on file   PHYSICAL EXAM  Vitals:   08/03/20 1016  BP: 108/70  Pulse: 79  Weight: 255 lb (115.7 kg)  Height: 5\' 7"  (1.702 m)   Body mass index is 39.94 kg/m.  Generalized: Well developed, in no acute distress  Neurological examination  Mentation: Alert oriented to time, place, history taking. Follows all commands speech and language fluent Cranial nerve II-XII: Pupils were equal Cordova reactive to light. Extraocular movements were full, visual field were full on confrontational test. Facial sensation and strength were normal. Head turning and shoulder shrug  were  normal and symmetric. Motor: The motor testing reveals 5 over 5 strength of all 4 extremities. Good symmetric motor tone is noted throughout.  Sensory: Sensory testing is intact to soft touch on all 4 extremities. No evidence of extinction is noted.  Coordination: Cerebellar testing reveals good finger-nose-finger and heel-to-shin bilaterally.  Gait and station: Gait is slightly wide-based, uses a single-point cane for ambulation. Reflexes: Deep tendon reflexes are symmetric but depressed throughout  DIAGNOSTIC DATA (LABS, IMAGING, TESTING) - I reviewed patient records, labs, notes, testing and imaging myself where available.  Lab Results  Component Value Date   WBC 10.3 07/08/2020   HGB 13.4 07/08/2020   HCT 42.6 07/08/2020   MCV 97.3 07/08/2020   PLT 275 07/08/2020      Component Value Date/Time   NA 139 07/08/2020 1124   K 3.7 07/08/2020 1124   CL 104 07/08/2020 1124   CO2 27 07/08/2020 1124   GLUCOSE 86 07/08/2020 1124   BUN 11 07/08/2020 1124   CREATININE 0.71 07/08/2020 1124   CREATININE 0.70 02/06/2018 1020   CALCIUM 8.7 (L) 07/08/2020 1124   PROT 7.1 07/02/2019 1149   ALBUMIN 3.8 07/02/2019 1149   AST 17 07/02/2019 1149   ALT 21 07/02/2019 1149   ALKPHOS 85 07/02/2019 1149   BILITOT 0.4 07/02/2019 1149   GFRNONAA >60 07/08/2020 1124   GFRNONAA 86 02/19/2016 1013   GFRAA >60 09/09/2019 2344   GFRAA >89 02/19/2016 1013   Lab Results  Component Value Date   CHOL 152 07/03/2019   HDL 37 (L) 07/03/2019   LDLCALC 77 07/03/2019   TRIG 189 (H) 07/03/2019   CHOLHDL 4.1 07/03/2019   Lab Results  Component Value Date   HGBA1C 5.7 (H) 07/03/2019   Lab Results  Component Value Date   VITAMINB12 328 07/27/2015   Lab Results  Component Value Date   TSH 1.44 09/05/2018   ASSESSMENT AND PLAN 60 y.o. year old female  has a past medical history of Anxiety, Arthritis, Asthma, Automatic implantable cardioverter-defibrillator in situ, Bell palsy, Cardiomyopathy  (08/2010), CHF (congestive heart failure) (HCC), Chronic systolic heart failure (HCC), COPD (chronic obstructive pulmonary disease) (), Dysrhythmia, Gastroesophageal reflux disease, Headache(784.0), Hot flashes (10/04/2016), Hyperlipidemia, Hypertension, Hypothyroidism, ICD (implantable cardiac defibrillator) in place (10/10/2011), Neuromuscular disorder ([place]), Neuropathy, Obesity, Pneumonia, Seizures ([place]), Shortness of breath, Stroke ([place]), and Tobacco abuse. here with:  1.  TIA event 2.  History of migraine with hemiplegic features 3.  Ongoing tobacco abuse  -Increase Topamax 100 mg at bedtime for headache prevention  -On average 2 occipital headaches weekly, Topamax has been helpful -Continue follow-up with PCP for management of vascular risk factors -Follow-up in 6 months or sooner if needed  Butler Denmark, AGNP-C, DNP  08/03/2020, 10:56 AM [facility] [address], [place],  [address] [phone]

## 2020-08-03 NOTE — Patient Instructions (Addendum)
Increase Topamax 100 mg at bedtime for headaches  See you back in 6 months or sooner if needed

## 2020-08-03 NOTE — Progress Notes (Signed)
I have read the note, and I agree with the clinical assessment and plan.  Melanie Cordova   

## 2020-08-27 ENCOUNTER — Ambulatory Visit (INDEPENDENT_AMBULATORY_CARE_PROVIDER_SITE_OTHER): Payer: Medicare Other

## 2020-08-27 DIAGNOSIS — I255 Ischemic cardiomyopathy: Secondary | ICD-10-CM

## 2020-08-31 LAB — CUP PACEART REMOTE DEVICE CHECK
Battery Remaining Longevity: 6 mo
Battery Remaining Percentage: 5 %
Battery Voltage: 2.62 V
Brady Statistic RV Percent Paced: 1 %
Date Time Interrogation Session: 20220617191513
HighPow Impedance: 84 Ohm
HighPow Impedance: 84 Ohm
Implantable Lead Implant Date: 20130729
Implantable Lead Location: 753860
Implantable Lead Model: 181
Implantable Lead Serial Number: 322336
Implantable Pulse Generator Implant Date: 20130729
Lead Channel Impedance Value: 600 Ohm
Lead Channel Pacing Threshold Amplitude: 0.75 V
Lead Channel Pacing Threshold Pulse Width: 0.5 ms
Lead Channel Sensing Intrinsic Amplitude: 9.9 mV
Lead Channel Setting Pacing Amplitude: 2.5 V
Lead Channel Setting Pacing Pulse Width: 0.5 ms
Lead Channel Setting Sensing Sensitivity: 0.5 mV
Pulse Gen Serial Number: 1022442

## 2020-09-01 ENCOUNTER — Encounter: Payer: Self-pay | Admitting: Internal Medicine

## 2020-09-01 ENCOUNTER — Other Ambulatory Visit: Payer: Self-pay

## 2020-09-01 ENCOUNTER — Ambulatory Visit (INDEPENDENT_AMBULATORY_CARE_PROVIDER_SITE_OTHER): Payer: Medicare Other | Admitting: Internal Medicine

## 2020-09-01 VITALS — BP 128/72 | HR 77 | Ht 67.5 in | Wt 255.0 lb

## 2020-09-01 DIAGNOSIS — I5022 Chronic systolic (congestive) heart failure: Secondary | ICD-10-CM

## 2020-09-01 DIAGNOSIS — I255 Ischemic cardiomyopathy: Secondary | ICD-10-CM | POA: Diagnosis not present

## 2020-09-01 NOTE — Patient Instructions (Signed)
Medication Instructions:  Your physician recommends that you continue on your current medications as directed. Please refer to the Current Medication list given to you today.  *If you need a refill on your cardiac medications before your next appointment, please call your pharmacy*   Lab Work: NONE   If you have labs (blood work) drawn today and your tests are completely normal, you will receive your results only by: . MyChart Message (if you have MyChart) OR . A paper copy in the mail If you have any lab test that is abnormal or we need to change your treatment, we will call you to review the results.   Testing/Procedures: NONE    Follow-Up: At [facility], you and your health needs are our priority.  As part of our continuing mission to provide you with exceptional heart care, we have created designated Provider Care Teams.  These Care Teams include your primary Cardiologist (physician) and Advanced Practice Providers (APPs -  Physician Assistants and Nurse Practitioners) who all work together to provide you with the care you need, when you need it.  We recommend signing up for the patient portal called "MyChart".  Sign up information is provided on this After Visit Summary.  MyChart is used to connect with patients for Virtual Visits (Telemedicine).  Patients are able to view lab/test results, encounter notes, upcoming appointments, etc.  Non-urgent messages can be sent to your provider as well.   To learn more about what you can do with MyChart, go to [email or web address].    Your next appointment:   1 year(s)  The format for your next appointment:   In Person  Provider:   Gregg Taylor, MD   Other Instructions Thank you for choosing [facility]!    

## 2020-09-01 NOTE — Progress Notes (Signed)
HPI Melanie Cordova returns today for followup. She is a 60 yo woman with chronic systolic heart failure, s/p ICD insertion, morbid obesity, HTN, and bronchitis. She has done better in the interim. No chest pain or sob. She has soreness on the left side of her neck. No edema or ICD therapies.      Allergies   Allergies  Allergen Reactions   Fish Allergy Anaphylaxis   Iodinated Diagnostic Agents Hives and Other (See Comments)    Pulmonary problems; no frank respiratory arrest   Iodine Hives and Other (See Comments)    Pulmonary Problems    Lentil Anaphylaxis   Penicillins Anaphylaxis and Shortness Of Breath    Has patient had a PCN reaction causing immediate rash, facial/tongue/throat swelling, SOB or lightheadedness with hypotension: Yes Has patient had a PCN reaction causing severe rash involving mucus membranes or skin necrosis: No Has patient had a PCN reaction that required hospitalization No Has patient had a PCN reaction occurring within the last 10 years: No If all of the above answers are "NO", then may proceed with Cephalosporin use.  Hair loss   Lipitor [Atorvastatin] Itching and Rash   Spironolactone Rash   Sulfa Antibiotics Other (See Comments)    Unknown     Current Outpatient Medications  Medication Sig Dispense Refill   acetaminophen (TYLENOL) 650 MG CR tablet Take 650 mg by mouth every 8 (eight) hours as needed for pain.     albuterol (PROVENTIL HFA;VENTOLIN HFA) 108 (90 BASE) MCG/ACT inhaler Inhale 2 puffs into the lungs every 6 (six) hours as needed for wheezing or shortness of breath. 1 Inhaler 5   aspirin EC 81 MG tablet Take 1 tablet (81 mg total) by mouth daily. 21 tablet 0   azithromycin (ZITHROMAX) 250 MG tablet Take 1 tablet (250 mg total) by mouth daily. Take first 2 tablets together, then 1 every day until finished. 6 tablet 0   benzonatate (TESSALON) 100 MG capsule Take 1 capsule (100 mg total) by mouth every 8 (eight) hours. 21 capsule 0   Biotin  10000 MCG TABS Take 10,000 mcg by mouth daily.      budesonide-formoterol (SYMBICORT) 80-4.5 MCG/ACT inhaler Inhale 2 puffs into the lungs 2 (two) times daily.     carvedilol (COREG) 12.5 MG tablet Take 25 mg by mouth 2 (two) times daily with a meal.     cetirizine (ZYRTEC) 10 MG tablet Take 10 mg by mouth daily.     Cholecalciferol (VITAMIN D3) 5000 units CAPS TAKE ONE CAPSULE BY MOUTH DAILY 90 capsule 0   clobetasol (TEMOVATE) 0.05 % external solution Apply 1 application topically 2 (two) times daily as needed (Apply to scalp.).      clopidogrel (PLAVIX) 75 MG tablet Take 1 tablet (75 mg total) by mouth daily. 21 tablet 0   diclofenac sodium (VOLTAREN) 1 % GEL Apply 2 g topically 4 (four) times daily as needed (pain).      diphenhydrAMINE (BENADRYL) 25 MG tablet Take 25 mg by mouth in the morning and at bedtime.      ENTRESTO 24-26 MG TAKE ONE TABLET BY MOUTH 2 TIMES A DAY (Patient taking differently: Take 1 tablet by mouth 2 (two) times daily.) 60 tablet 0   famotidine (PEPCID) 20 MG tablet Take 20 mg by mouth daily as needed for heartburn or indigestion.     Ferrous Sulfate (IRON) 28 MG TABS Take 28 mg by mouth daily.      fluticasone (FLONASE)  50 MCG/ACT nasal spray Place 2 sprays into both nostrils daily.     furosemide (LASIX) 40 MG tablet Take 1 tablet (40 mg total) by mouth 2 (two) times daily. 180 tablet 0   ketoconazole (NIZORAL) 2 % cream Apply 1 application topically 2 (two) times daily.      levothyroxine (SYNTHROID, LEVOTHROID) 200 MCG tablet Take 1 tablet (200 mcg total) by mouth daily before breakfast. 90 tablet 2   LINZESS 145 MCG CAPS capsule Take 145 mcg by mouth daily.     Multiple Vitamins-Minerals (CENTRAVITES 50 PLUS PO) Take 1 tablet by mouth daily.      pantoprazole (PROTONIX) 20 MG tablet Take 1 tablet (20 mg total) by mouth daily. 90 tablet 1   potassium chloride SA (K-DUR) 20 MEQ tablet Take 20 mEq by mouth daily.      psyllium (METAMUCIL) 58.6 % packet Take 1 packet  by mouth daily.     rosuvastatin (CRESTOR) 20 MG tablet Take 20 mg by mouth daily.     Simethicone (GAS-X PO) Take 1 tablet by mouth in the morning and at bedtime.      topiramate (TOPAMAX) 100 MG tablet Take 1 tablet (100 mg total) by mouth at bedtime. 90 tablet 1   Vitamin D, Ergocalciferol, (DRISDOL) 1.25 MG (50000 UNIT) CAPS capsule Take 50,000 Units by mouth every 30 (thirty) days.     No current facility-administered medications for this visit.     Past Medical History:  Diagnosis Date   Anxiety    Arthritis    Asthma    Automatic implantable cardioverter-defibrillator in situ    2013, july   Bell palsy    states has had 3 episodes   Cardiomyopathy 08/2010   Presented with congestive heart failure; EF of 15% and 2012; hypotension on medication precludes optimal dosing   CHF (congestive heart failure) ([place])    a. EF 20% in 2012 with low-risk NST --> St. Jude ICD implantation by Dr. Lovena Le in 28/7867   Chronic systolic heart failure ([place])    COPD (chronic obstructive pulmonary disease) ([place])    2013   Dysrhythmia    Gastroesophageal reflux disease    Headache(784.0)    Hot flashes 10/04/2016   Hyperlipidemia    Hypertension    09/2010-normal CMet and CBC; Lipid profile-116, 88, 25, 73   Hypothyroidism    Recent TSH was normal.   ICD (implantable cardiac defibrillator) in place 10/10/2011   Neuromuscular disorder (HCC)    Neuropathy, right foot and leg   Neuropathy    Obesity    Pneumonia    Seizures ([place])    last one at age 71   Shortness of breath    Stroke ([place])     stroke in 07/2012 and another in June, 2014   Tobacco abuse    20 pack years    ROS:   All systems reviewed and negative except as noted in the HPI.   Past Surgical History:  Procedure Laterality Date   CESAREAN SECTION     X2   COLONOSCOPY N/A 12/16/2013   One simple adenoma and 3 hyperplastic polyps. Small internal hemorrhoids. Left colon redundant. Due for surveillance 2020.    COLONOSCOPY  WITH PROPOFOL N/A 06/04/2019   Procedure: COLONOSCOPY WITH PROPOFOL;  Surgeon: Danie Binder, MD;  [facility];  Service: Endoscopy;  Laterality: N/A;  10:30am   EP IMPLANTABLE DEVICE     St. Jude   EP IMPLANTABLE DEVICE N/A 08/13/2015  Procedure: Loop Recorder Insertion;  Surgeon: Evans Lance, MD;  Location: [facility];  Service: Cardiovascular;  Laterality: N/A;   IMPLANTABLE CARDIOVERTER DEFIBRILLATOR IMPLANT N/A 10/10/2011   Procedure: IMPLANTABLE CARDIOVERTER DEFIBRILLATOR IMPLANT;  Surgeon: Evans Lance, MD;  Location: [facility];  Service: Cardiovascular;  Laterality: N/A;   MULTIPLE EXTRACTIONS WITH ALVEOLOPLASTY N/A 09/24/2012   Procedure: MULTIPLE EXTRACION #2, 4, 6, 7 ,8, 9, 11, 13, 18, 20, 21, 22, 23, 24, 25, 26, 27, 29 WITH ALVEOLOPLASTY, BIOPSY OF PALATE LESION, REMOVA RIGHT LINGUAL TORUS;  Surgeon: Gae Bon, DDS;  Location: [facility];  Service: Oral Surgery;  Laterality: N/A;   POLYPECTOMY  06/04/2019   Procedure: POLYPECTOMY;  Surgeon: Danie Binder, MD;  Location: [facility];  Service: Endoscopy;;   TEE WITHOUT CARDIOVERSION N/A 07/11/2012   Procedure: TRANSESOPHAGEAL ECHOCARDIOGRAM (TEE);  Surgeon: Thayer Headings, MD;  Location: [facility];  Service: Cardiovascular;  Laterality: N/A;   TUBAL LIGATION       Family History  Problem Relation Age of Onset   Cardiomyopathy Mother        ICD pacemaker-ischmic CM   Heart failure Mother    Hypertension Mother    Diabetes Mother    Thyroid disease Mother    Cardiomyopathy Father        Deceased   Coronary artery disease Father    Heart failure Father    Heart failure Brother    Hypertension Brother    Endometriosis Sister    Heart disease Paternal Grandfather    Heart disease Paternal Grandmother    Heart disease Maternal Grandmother    Heart disease Maternal Grandfather    Hypertension Daughter    Obesity Daughter    Colon cancer Neg Hx    Liver disease Neg Hx      Social History    Socioeconomic History   Marital status: Divorced    Spouse name: Not on file   Number of children: 2   Years of education: Not on file   Highest education level: Not on file  Occupational History    Employer: [employer]    Comment: Works in Environmental consultant  Tobacco Use   Smoking status: Every Day    Packs/day: 0.75    Years: 21.00    Pack years: 15.75    Types: Cigarettes    Start date: 07/02/1983   Smokeless tobacco: Never  Vaping Use   Vaping Use: Former  Substance and Sexual Activity   Alcohol use: No    Alcohol/week: 0.0 standard drinks   Drug use: No   Sexual activity: Not Currently    Birth control/protection: Abstinence, Surgical    Comment: tubal  Other Topics Concern   Not on file  Social History Narrative   Not on file   Social Determinants of Health   Financial Resource Strain: Not on file  Food Insecurity: Not on file  Transportation Needs: Not on file  Physical Activity: Not on file  Stress: Not on file  Social Connections: Not on file  Intimate Partner Violence: Not on file     BP 128/72   Pulse 77   Ht 5' 7.5" (1.715 m)   Wt 255 lb (115.7 kg)   SpO2 97%   BMI 39.35 kg/m   Physical Exam:  Well appearing NAD HEENT: Unremarkable Neck:  No JVD, no thyromegally Lymphatics:  No adenopathy Back:  No CVA tenderness Lungs:  Clear HEART:  Regular rate rhythm, no  murmurs, no rubs, no clicks Abd:  soft, positive bowel sounds, no organomegally, no rebound, no guarding Ext:  2 plus pulses, no edema, no cyanosis, no clubbing Skin:  No rashes no nodules Neuro:  CN II through XII intact, motor grossly intact   DEVICE  Normal device function.  See PaceArt for details.   Assess/Plan:  1. Chronic systolic heart failure - her symptoms are class 2. She is encouraged to maintain a low sodium diet and to lose weight. No change in meds today. 2. ICD - her St. Jude single chamber ICD is working normally. We will recheck in several months. She is  approaching ERI. 3. Obesity - she is encouraged to lose weight.   Mikle [email or web address].

## 2020-09-04 ENCOUNTER — Other Ambulatory Visit: Payer: Self-pay | Admitting: Internal Medicine

## 2020-09-17 NOTE — Addendum Note (Signed)
Addended by: Douglass Rivers D on: 09/17/2020 02:11 PM   Modules accepted: Level of Service

## 2020-09-17 NOTE — Progress Notes (Signed)
Remote ICD transmission.   

## 2020-09-28 ENCOUNTER — Ambulatory Visit (INDEPENDENT_AMBULATORY_CARE_PROVIDER_SITE_OTHER): Payer: Medicare Other

## 2020-09-28 DIAGNOSIS — I255 Ischemic cardiomyopathy: Secondary | ICD-10-CM

## 2020-09-29 LAB — CUP PACEART REMOTE DEVICE CHECK
Battery Remaining Longevity: 4 mo
Battery Remaining Percentage: 3 %
Battery Voltage: 2.6 V
Brady Statistic RV Percent Paced: 1 %
Date Time Interrogation Session: 20220719015027
HighPow Impedance: 78 Ohm
HighPow Impedance: 78 Ohm
Implantable Lead Implant Date: 20130729
Implantable Lead Location: 753860
Implantable Lead Model: 181
Implantable Lead Serial Number: 322336
Implantable Pulse Generator Implant Date: 20130729
Lead Channel Impedance Value: 680 Ohm
Lead Channel Pacing Threshold Amplitude: 0.75 V
Lead Channel Pacing Threshold Pulse Width: 0.5 ms
Lead Channel Sensing Intrinsic Amplitude: 11.6 mV
Lead Channel Setting Pacing Amplitude: 2.5 V
Lead Channel Setting Pacing Pulse Width: 0.5 ms
Lead Channel Setting Sensing Sensitivity: 0.5 mV
Pulse Gen Serial Number: 1022442

## 2020-10-20 NOTE — Progress Notes (Signed)
Remote ICD transmission.   

## 2020-10-20 NOTE — Addendum Note (Signed)
Addended by: Douglass Rivers D on: 10/20/2020 03:11 PM   Modules accepted: Level of Service

## 2020-10-29 ENCOUNTER — Ambulatory Visit (INDEPENDENT_AMBULATORY_CARE_PROVIDER_SITE_OTHER): Payer: Medicare Other

## 2020-10-29 DIAGNOSIS — I255 Ischemic cardiomyopathy: Secondary | ICD-10-CM

## 2020-11-02 LAB — CUP PACEART REMOTE DEVICE CHECK
Battery Remaining Longevity: 1 mo
Battery Remaining Percentage: 2 %
Battery Voltage: 2.6 V
Brady Statistic RV Percent Paced: 1 %
Date Time Interrogation Session: 20220818040018
HighPow Impedance: 74 Ohm
HighPow Impedance: 74 Ohm
Implantable Lead Implant Date: 20130729
Implantable Lead Location: 753860
Implantable Lead Model: 181
Implantable Lead Serial Number: 322336
Implantable Pulse Generator Implant Date: 20130729
Lead Channel Impedance Value: 640 Ohm
Lead Channel Pacing Threshold Amplitude: 0.75 V
Lead Channel Pacing Threshold Pulse Width: 0.5 ms
Lead Channel Sensing Intrinsic Amplitude: 12 mV
Lead Channel Setting Pacing Amplitude: 2.5 V
Lead Channel Setting Pacing Pulse Width: 0.5 ms
Lead Channel Setting Sensing Sensitivity: 0.5 mV
Pulse Gen Serial Number: 1022442

## 2020-11-10 ENCOUNTER — Observation Stay (HOSPITAL_COMMUNITY)
Admission: EM | Admit: 2020-11-10 | Discharge: 2020-11-11 | Disposition: A | Discharge status: Home / Self Care | Payer: Medicare Other | Attending: Internal Medicine | Admitting: Internal Medicine

## 2020-11-10 ENCOUNTER — Encounter (HOSPITAL_COMMUNITY): Payer: Self-pay | Admitting: Emergency Medicine

## 2020-11-10 ENCOUNTER — Emergency Department (HOSPITAL_COMMUNITY): Payer: Medicare Other

## 2020-11-10 ENCOUNTER — Other Ambulatory Visit: Payer: Self-pay

## 2020-11-10 DIAGNOSIS — Z8673 Personal history of transient ischemic attack (TIA), and cerebral infarction without residual deficits: Secondary | ICD-10-CM | POA: Diagnosis not present

## 2020-11-10 DIAGNOSIS — Z4502 Encounter for adjustment and management of automatic implantable cardiac defibrillator: Secondary | ICD-10-CM | POA: Insufficient documentation

## 2020-11-10 DIAGNOSIS — Z7982 Long term (current) use of aspirin: Secondary | ICD-10-CM | POA: Insufficient documentation

## 2020-11-10 DIAGNOSIS — Z7902 Long term (current) use of antithrombotics/antiplatelets: Secondary | ICD-10-CM | POA: Diagnosis not present

## 2020-11-10 DIAGNOSIS — F17210 Nicotine dependence, cigarettes, uncomplicated: Secondary | ICD-10-CM | POA: Diagnosis not present

## 2020-11-10 DIAGNOSIS — E039 Hypothyroidism, unspecified: Secondary | ICD-10-CM | POA: Diagnosis not present

## 2020-11-10 DIAGNOSIS — I5022 Chronic systolic (congestive) heart failure: Secondary | ICD-10-CM | POA: Diagnosis not present

## 2020-11-10 DIAGNOSIS — T829XXA Unspecified complication of cardiac and vascular prosthetic device, implant and graft, initial encounter: Secondary | ICD-10-CM | POA: Diagnosis present

## 2020-11-10 DIAGNOSIS — J45909 Unspecified asthma, uncomplicated: Secondary | ICD-10-CM | POA: Diagnosis not present

## 2020-11-10 DIAGNOSIS — I110 Hypertensive heart disease with heart failure: Secondary | ICD-10-CM | POA: Insufficient documentation

## 2020-11-10 DIAGNOSIS — Z79899 Other long term (current) drug therapy: Secondary | ICD-10-CM | POA: Diagnosis not present

## 2020-11-10 DIAGNOSIS — I10 Essential (primary) hypertension: Secondary | ICD-10-CM | POA: Insufficient documentation

## 2020-11-10 DIAGNOSIS — J449 Chronic obstructive pulmonary disease, unspecified: Secondary | ICD-10-CM | POA: Insufficient documentation

## 2020-11-10 DIAGNOSIS — Z20822 Contact with and (suspected) exposure to covid-19: Secondary | ICD-10-CM | POA: Insufficient documentation

## 2020-11-10 DIAGNOSIS — Z95810 Presence of automatic (implantable) cardiac defibrillator: Secondary | ICD-10-CM | POA: Diagnosis present

## 2020-11-10 NOTE — ED Triage Notes (Signed)
Pt states her defibrillator stated vibrating tonight and knows her battery is going dead. Per pt she was told to come to ER when it started vibrating. Pt has no other complaints.

## 2020-11-11 ENCOUNTER — Encounter (HOSPITAL_COMMUNITY): Payer: Self-pay | Admitting: Internal Medicine

## 2020-11-11 ENCOUNTER — Telehealth: Payer: Self-pay

## 2020-11-11 DIAGNOSIS — Z95810 Presence of automatic (implantable) cardiac defibrillator: Secondary | ICD-10-CM | POA: Diagnosis present

## 2020-11-11 DIAGNOSIS — I509 Heart failure, unspecified: Secondary | ICD-10-CM

## 2020-11-11 DIAGNOSIS — Z8673 Personal history of transient ischemic attack (TIA), and cerebral infarction without residual deficits: Secondary | ICD-10-CM | POA: Diagnosis not present

## 2020-11-11 DIAGNOSIS — E785 Hyperlipidemia, unspecified: Secondary | ICD-10-CM | POA: Diagnosis not present

## 2020-11-11 DIAGNOSIS — Z4502 Encounter for adjustment and management of automatic implantable cardiac defibrillator: Secondary | ICD-10-CM | POA: Diagnosis not present

## 2020-11-11 LAB — CBC WITH DIFFERENTIAL/PLATELET
Abs Immature Granulocytes: 0.06 10*3/uL (ref 0.00–0.07)
Basophils Absolute: 0.1 10*3/uL (ref 0.0–0.1)
Basophils Relative: 1 %
Eosinophils Absolute: 0.3 10*3/uL (ref 0.0–0.5)
Eosinophils Relative: 3 %
HCT: 43.5 % (ref 36.0–46.0)
Hemoglobin: 14.1 g/dL (ref 12.0–15.0)
Immature Granulocytes: 1 %
Lymphocytes Relative: 35 %
Lymphs Abs: 3.6 10*3/uL (ref 0.7–4.0)
MCH: 31.1 pg (ref 26.0–34.0)
MCHC: 32.4 g/dL (ref 30.0–36.0)
MCV: 96 fL (ref 80.0–100.0)
Monocytes Absolute: 0.6 10*3/uL (ref 0.1–1.0)
Monocytes Relative: 6 %
Neutro Abs: 5.7 10*3/uL (ref 1.7–7.7)
Neutrophils Relative %: 54 %
Platelets: 240 10*3/uL (ref 150–400)
RBC: 4.53 MIL/uL (ref 3.87–5.11)
RDW: 13.5 % (ref 11.5–15.5)
WBC: 10.3 10*3/uL (ref 4.0–10.5)
nRBC: 0 % (ref 0.0–0.2)

## 2020-11-11 LAB — RESP PANEL BY RT-PCR (FLU A&B, COVID) ARPGX2
Influenza A by PCR: NEGATIVE
Influenza B by PCR: NEGATIVE
SARS Coronavirus 2 by RT PCR: NEGATIVE

## 2020-11-11 LAB — BASIC METABOLIC PANEL
Anion gap: 5 (ref 5–15)
BUN: 17 mg/dL (ref 6–20)
CO2: 26 mmol/L (ref 22–32)
Calcium: 8.8 mg/dL — ABNORMAL LOW (ref 8.9–10.3)
Chloride: 108 mmol/L (ref 98–111)
Creatinine, Ser: 0.8 mg/dL (ref 0.44–1.00)
GFR, Estimated: >60 mL/min (ref >60)
Glucose, Bld: 109 mg/dL — ABNORMAL HIGH (ref 70–99)
Potassium: 3.5 mmol/L (ref 3.5–5.1)
Sodium: 139 mmol/L (ref 135–145)

## 2020-11-11 LAB — MAGNESIUM: Magnesium: 2.2 mg/dL (ref 1.7–2.4)

## 2020-11-11 LAB — CBG MONITORING, ED: Glucose-Capillary: 121 mg/dL — ABNORMAL HIGH (ref 70–99)

## 2020-11-11 NOTE — Consult Note (Signed)
Cardiology Consult   Patient ID: Melanie Cordova MRN: [id number]; DOB: 1960-04-25   Consult Date: 11/10/2020  PCP:  Rosita Fire, [facility] Providers Cardiologist:  [name], MD       Chief Complaint:  ICD Vibrating  Patient Profile:   Melanie Cordova is a 60 y.o. female with past medical history of presumed nonischemic cardiomyopathy (EF 20% in 2012 with low-risk NST --> St. Jude ICD implantation by Dr. 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  History of Present Illness:   [name] was last examined by Dr. Lovena Le in 08/2020 and denied any recent chest pain or dyspnea on exertion at that time. Her device was approaching ERI and most recent remote check on 11/02/2020 showed she had less than 3 months of battery left.  In talking with the patient today, she reports being in her usual state of health until her ICD started to vibrate last night around 9:45 PM. Reports it was a strong vibration and she had never felt anything like that before. Her device did not fire off. She denies any associated chest pain, dyspnea, nausea, vomiting or diaphoresis. Says she felt very anxious at that time and called EMS.   Her device continued to vibrate for 20-30 minutes then resolved. She feels back to baseline this morning. Says she did not sleep well due to being uncomfortable on the stretcher. Denies any chest pain, palpitations or dyspnea over the past few weeks.   Initial labs showed WBC 10.3, Hgb 14.1, platelets 240, Na+ 139, K+ 3.5 and creatinine 0.80. Mg 2.2. COVID negative. CXR with no active disease. EKG showed NSR, HR 76 with slight TWI along the precordial leads which is similar to  prior tracings.    Past Medical History:  Diagnosis Date   Anxiety    Arthritis    Asthma    Automatic implantable cardioverter-defibrillator in situ    2013, july   Bell palsy    states has had 3 episodes   Cardiomyopathy 08/2010   Presented with congestive heart failure; EF of 15% and 2012; hypotension on medication precludes optimal dosing   CHF (congestive heart failure) ([place])    a. EF 20% in 2012 with low-risk NST --> St. Jude ICD implantation by Dr. Lovena Le in 09/2011 b. NST in 12/2017 showing evidence of prior infarct without ischemia c. EF at 45-50% by echo in [id number]   Chronic systolic heart failure ([place])    COPD (chronic obstructive pulmonary disease) ([place])    2013   Dysrhythmia    Gastroesophageal reflux disease    Headache(784.0)    Hot flashes 10/04/2016   Hyperlipidemia    Hypertension    09/2010-normal CMet and CBC; Lipid profile-116, 88, 25, 73   Hypothyroidism    Recent TSH was normal.   ICD (implantable cardiac defibrillator) in place 10/10/2011   Neuromuscular disorder (HCC)    Neuropathy, right foot and leg   Neuropathy    Obesity    Pneumonia    Seizures ([place])    last one at age 53   Shortness of breath    Stroke ([place])     stroke in 07/2012 and another in June,  2014   Tobacco abuse    20 pack years    Past Surgical History:  Procedure Laterality Date   CESAREAN SECTION     X2   COLONOSCOPY N/A 12/16/2013   One simple adenoma and 3 hyperplastic polyps. Small internal hemorrhoids. Left colon redundant. Due for surveillance 2020.    COLONOSCOPY WITH PROPOFOL N/A 06/04/2019   Procedure: COLONOSCOPY WITH PROPOFOL;  Surgeon: Danie Binder, MD;  [facility];  Service: Endoscopy;  Laterality: N/A;  10:30am   EP IMPLANTABLE DEVICE     St. Jude   EP IMPLANTABLE DEVICE N/A 08/13/2015   Procedure: Loop Recorder Insertion;  Surgeon: Evans Lance, MD;  Location: [facility];  Service: Cardiovascular;  Laterality: N/A;   IMPLANTABLE  CARDIOVERTER DEFIBRILLATOR IMPLANT N/A 10/10/2011   Procedure: IMPLANTABLE CARDIOVERTER DEFIBRILLATOR IMPLANT;  Surgeon: Evans Lance, MD;  Location: [facility];  Service: Cardiovascular;  Laterality: N/A;   MULTIPLE EXTRACTIONS WITH ALVEOLOPLASTY N/A 09/24/2012   Procedure: MULTIPLE EXTRACION #2, 4, 6, 7 ,8, 9, 11, 13, 18, 20, 21, 22, 23, 24, 25, 26, 27, 29 WITH ALVEOLOPLASTY, BIOPSY OF PALATE LESION, REMOVA RIGHT LINGUAL TORUS;  Surgeon: Gae Bon, DDS;  Location: [facility];  Service: Oral Surgery;  Laterality: N/A;   POLYPECTOMY  06/04/2019   Procedure: POLYPECTOMY;  Surgeon: Danie Binder, MD;  [facility];  Service: Endoscopy;;   TEE WITHOUT CARDIOVERSION N/A 07/11/2012   Procedure: TRANSESOPHAGEAL ECHOCARDIOGRAM (TEE);  Surgeon: Thayer Headings, MD;  Location: [facility];  Service: Cardiovascular;  Laterality: N/A;   TUBAL LIGATION       Medications Prior to Admission: Prior to Admission medications   Medication Sig Start Date End Date Taking? Authorizing Provider  acetaminophen (TYLENOL) 650 MG CR tablet Take 650 mg by mouth every 8 (eight) hours as needed for pain.    [provider]  albuterol (PROVENTIL HFA;VENTOLIN HFA) 108 (90 BASE) MCG/ACT inhaler Inhale 2 puffs into the lungs every 6 (six) hours as needed for wheezing or shortness of breath. 07/12/12   Trinda Pascal, MD  aspirin EC 81 MG tablet Take 1 tablet (81 mg total) by mouth daily. 07/03/19   Donzetta Starch, NP  azithromycin (ZITHROMAX) 250 MG tablet Take 1 tablet (250 mg total) by mouth daily. Take first 2 tablets together, then 1 every day until finished. 04/10/20   Henderly, Britni A, PA-C  benzonatate (TESSALON) 100 MG capsule Take 1 capsule (100 mg total) by mouth every 8 (eight) hours. 04/10/20   Henderly, Britni A, PA-C  Biotin 10000 MCG TABS Take 10,000 mcg by mouth daily.     [provider]  budesonide-formoterol (SYMBICORT) 80-4.5 MCG/ACT inhaler Inhale 2 puffs into the lungs 2 (two)  times daily.    [provider]  carvedilol (COREG) 12.5 MG tablet Take 25 mg by mouth 2 (two) times daily with a meal.    [provider]  cetirizine (ZYRTEC) 10 MG tablet Take 10 mg by mouth daily.    [provider]  Cholecalciferol (VITAMIN D3) 5000 units CAPS TAKE ONE CAPSULE BY MOUTH DAILY 09/22/17   [name], MD  clobetasol (TEMOVATE) 0.05 % external solution Apply 1 application topically 2 (two) times daily as needed (Apply to scalp.).  07/24/15   [provider]  clopidogrel (PLAVIX) 75 MG tablet Take 1 tablet (75 mg total) by mouth daily. 07/03/19   Rosalin Hawking, MD  diclofenac sodium (VOLTAREN) 1 % GEL  Apply 2 g topically 4 (four) times daily as needed (pain).     [provider]  diphenhydrAMINE (BENADRYL) 25 MG tablet Take 25 mg by mouth in the morning and at bedtime.     [provider]  ENTRESTO 24-26 MG TAKE ONE TABLET BY MOUTH 2 TIMES A DAY Patient taking differently: Take 1 tablet by mouth 2 (two) times daily. 12/31/18   Evans Lance, MD  famotidine (PEPCID) 20 MG tablet Take 20 mg by mouth daily as needed for heartburn or indigestion.    [provider]  Ferrous Sulfate (IRON) 28 MG TABS Take 28 mg by mouth daily.     [provider]  fluticasone (FLONASE) 50 MCG/ACT nasal spray Place 2 sprays into both nostrils daily.    [provider]  furosemide (LASIX) 40 MG tablet Take 1 tablet (40 mg total) by mouth 2 (two) times daily. 08/29/12   Yehuda Savannah, MD  ketoconazole (NIZORAL) 2 % cream Apply 1 application topically 2 (two) times daily.  10/21/15   [provider]  levothyroxine (SYNTHROID, LEVOTHROID) 200 MCG tablet Take 1 tablet (200 mcg total) by mouth daily before breakfast. 03/01/18   Nida, Marella Chimes, MD  LINZESS 145 MCG CAPS capsule Take 145 mcg by mouth daily. 02/12/20   [provider]  Multiple Vitamins-Minerals (CENTRAVITES 50 PLUS PO) Take 1 tablet by  mouth daily.     [provider]  pantoprazole (PROTONIX) 20 MG tablet Take 1 tablet (20 mg total) by mouth daily. 06/21/18   Raydin Bielinski, Fransisco Hertz, PA-C  potassium chloride SA (K-DUR) 20 MEQ tablet Take 20 mEq by mouth daily.  05/23/18   [provider]  psyllium (METAMUCIL) 58.6 % packet Take 1 packet by mouth daily.    [provider]  rosuvastatin (CRESTOR) 20 MG tablet Take 20 mg by mouth daily.    [provider]  Simethicone (GAS-X PO) Take 1 tablet by mouth in the morning and at bedtime.     [provider]  topiramate (TOPAMAX) 100 MG tablet Take 1 tablet (100 mg total) by mouth at bedtime. 08/03/20   Suzzanne Cloud, NP  Vitamin D, Ergocalciferol, (DRISDOL) 1.25 MG (50000 UNIT) CAPS capsule Take 50,000 Units by mouth every 30 (thirty) days.    [provider]     Allergies:    Allergies  Allergen Reactions   Fish Allergy Anaphylaxis   Iodinated Diagnostic Agents Hives and Other (See Comments)    Pulmonary problems; no frank respiratory arrest   Iodine Hives and Other (See Comments)    Pulmonary Problems    Lentil Anaphylaxis   Penicillins Anaphylaxis and Shortness Of Breath    Has patient had a PCN reaction causing immediate rash, facial/tongue/throat swelling, SOB or lightheadedness with hypotension: Yes Has patient had a PCN reaction causing severe rash involving mucus membranes or skin necrosis: No Has patient had a PCN reaction that required hospitalization No Has patient had a PCN reaction occurring within the last 10 years: No If all of the above answers are "NO", then may proceed with Cephalosporin use.  Hair loss   Lipitor [Atorvastatin] Itching and Rash   Spironolactone Rash   Sulfa Antibiotics Other (See Comments)    Unknown    Social History:   Social History   Socioeconomic History   Marital status: Divorced    Spouse name: Not on file   Number of children: 2   Years of education: Not on file  Highest  education level: Not on file  Occupational History    Employer: [employer]    Comment: Works in Environmental consultant  Tobacco Use   Smoking status: Every Day    Packs/day: 0.75    Years: 21.00    Pack years: 15.75    Types: Cigarettes    Start date: 07/02/1983   Smokeless tobacco: Never  Vaping Use   Vaping Use: Former  Substance and Sexual Activity   Alcohol use: No    Alcohol/week: 0.0 standard drinks   Drug use: No   Sexual activity: Not Currently    Birth control/protection: Abstinence, Surgical    Comment: tubal  Other Topics Concern   Not on file  Social History Narrative   Not on file   Social Determinants of Health   Financial Resource Strain: Not on file  Food Insecurity: Not on file  Transportation Needs: Not on file  Physical Activity: Not on file  Stress: Not on file  Social Connections: Not on file  Intimate Partner Violence: Not on file    Family History:   The patient's family history includes Cardiomyopathy in her father and mother; Coronary artery disease in her father; Diabetes in her mother; Endometriosis in her sister; Heart disease in her maternal grandfather, maternal grandmother, paternal grandfather, and paternal grandmother; Heart failure in her brother, father, and mother; Hypertension in her brother, daughter, and mother; Obesity in her daughter; Thyroid disease in her mother. There is no history of Colon cancer or Liver disease.    ROS:  Please see the history of present illness.  All other ROS reviewed and negative.     Physical Exam/Data:   Vitals:   11/11/20 0100 11/11/20 0430 11/11/20 0713 11/11/20 0848  BP: 111/71 120/78 103/63 107/70  Pulse: 77 72 89 75  Resp: (!) '21 18 18 18  '$ Temp:   (!) 97.5 F (36.4 C) (!) 97.3 F (36.3 C)  TempSrc:   Oral Oral  SpO2: 95% 93% 96% 97%  Weight:      Height:       No intake or output data in the 24 hours ending 11/11/20 0850 Last 3 Weights 11/10/2020 09/01/2020 08/03/2020  Weight (lbs) 253 lb  8.5 oz 255 lb 255 lb  Weight (kg) 115 kg 115.667 kg 115.667 kg     Body mass index is 39.12 kg/m.  General: Pleasant, obese female appearing in no acute distress HEENT: normal Lymph: no adenopathy Neck: no JVD Endocrine:  No thryomegaly Vascular: No carotid bruits; FA pulses 2+ bilaterally without bruits  Cardiac:  normal S1, S2; RRR; no murmur. Lungs:  clear to auscultation bilaterally, no wheezing, rhonchi or rales  Abd: soft, nontender, no hepatomegaly  Ext: no pitting edema Musculoskeletal:  No deformities, BUE and BLE strength normal and equal Skin: warm and dry  Neuro:  CNs 2-12 intact, no focal abnormalities noted Psych:  Normal affect    EKG:  The ECG that was done  was personally reviewed and demonstrates NSR, HR 76 with slight TWI along the precordial leads which is similar to prior tracings.   Relevant CV Studies:  Echocardiogram: 06/2019 IMPRESSIONS     1. Left ventricular ejection fraction, by estimation, is 45 to 50%. The  left ventricle has mildly decreased function. Left ventricular endocardial  border not optimally defined to evaluate regional wall motion. There is  mild concentric left ventricular  hypertrophy. Left ventricular diastolic parameters are consistent with  Grade I diastolic dysfunction (impaired relaxation).  2. Right ventricular systolic function is normal. The right ventricular  size is mildly enlarged. Tricuspid regurgitation signal is inadequate for  assessing PA pressure.   3. The mitral valve is grossly normal. Trivial mitral valve  regurgitation. No evidence of mitral stenosis.   4. The aortic valve is grossly normal. Aortic valve regurgitation is not  visualized. No aortic stenosis is present.   5. The inferior vena cava is normal in size with greater than 50%  respiratory variability, suggesting right atrial pressure of 3 mmHg.   Comparison(s): Prior images unable to be directly viewed, comparison made  by report only. Changes  from prior study are noted. The left ventricular  function has improved.   Laboratory Data:  High Sensitivity Troponin:  No results for input(s): TROPONINIHS in the last 720 hours.    Chemistry Recent Labs  Lab 11/10/20 2350  NA 139  K 3.5  CL 108  CO2 26  GLUCOSE 109*  BUN 17  CREATININE 0.80  CALCIUM 8.8*  GFRNONAA >60  ANIONGAP 5    No results for input(s): PROT, ALBUMIN, AST, ALT, ALKPHOS, BILITOT in the last 168 hours. Hematology Recent Labs  Lab 11/10/20 2350  WBC 10.3  RBC 4.53  HGB 14.1  HCT 43.5  MCV 96.0  MCH 31.1  MCHC 32.4  RDW 13.5  PLT 240   BNPNo results for input(s): BNP, PROBNP in the last 168 hours.  DDimer No results for input(s): DDIMER in the last 168 hours.   Radiology/Studies:  DG Chest Port 1 View  Result Date: 11/10/2020 CLINICAL DATA:  Defibrillator malfunction. EXAM: PORTABLE CHEST 1 VIEW COMPARISON:  Chest radiograph dated 07/08/2020. FINDINGS: Minimal bibasilar atelectasis. No focal consolidation, pleural effusion or pneumothorax. The cardiac silhouette within limits. Loop recorder device. Left pectoral AICD device. The wires are intact. No acute osseous pathology. IMPRESSION: No active disease. Electronically Signed   By: Anner Crete M.D.   On: 11/10/2020 23:29     Assessment and Plan:   1. ICD at [facility] - She presented to [facility] ED due to her ICD vibrating and device interrogation showed she was at [facility] which caused the vibration sensation but no arrhythmias. She was initially accepted by the overnight fellow to [facility] for EP evaluation but upon reviewing with EP, there is a 59-monthwindow for a generator change and is not an urgent procedure. Therefore, transfer was canceled with plans for outpatient EP follow-up with Dr. TLovena Leand subsequent outpatient gen-change. This was reviewed with the patient who is in agreement as well.   2. HFimpEF - EF previously as low as 20% in 2012 and improved to 45-50% by echo in 06/2019. She  denies any recent orthopnea, PND or lower extremity edema and appears euvolemic on examination today.  - Would continue her current cardiac medications to include Coreg '25mg'$  BID, Entresto 24-'26mg'$  BID and Lasix '40mg'$  BID. Previously intolerant to Spironolactone. If her EF remains reduced by repeat imaging, would consider adding an SGLT-2 inhibitor as an outpatient.   3. History of CVA - She has remains on ASA and Plavix per Neurology.   4. HLD - LDL was at 77 in 2021. She remains on Crestor '20mg'$  daily.    For questions or updates, please contact CBell ArthurPlease consult [email or web address] for contact info under     Signed, BErma Heritage PA-C  11/11/2020 8:50 AM

## 2020-11-11 NOTE — ED Notes (Signed)
Pt in bed, pt denies chest pain, pt reports some back pain, states that the er stretches is very hard, states that she has no needs at this time, updated pt, pt awaits transport.

## 2020-11-11 NOTE — Telephone Encounter (Signed)
Merlin alert Device has reached ERI 8/30 Route to triage LR  Transmission and EMR reviewed. Patient presented to ED yesterday after feeling "vibration" in her chest. Cards consulted. Patient made aware that devices was at [facility]. Per ED documentation she will need appointment with Dr. Lovena Le to discuss gen change. Will forward to scheduling.

## 2020-11-11 NOTE — Discharge Instructions (Addendum)
Be sure to keep your appointment with your cardiologist.  Return here for concerning changes in your condition.

## 2020-11-11 NOTE — ED Notes (Signed)
Cardiology PA and MD at bedside, transfer canceled, states that they can do the aicd replacement as an outpatient procedure and don't need to be transferred to [facility], pt to be discharged.

## 2020-11-11 NOTE — ED Notes (Signed)
Called floor to inform them that pt would not be transferred

## 2020-11-12 NOTE — ED Provider Notes (Signed)
[facility] EMERGENCY DEPARTMENT Provider Note   CSN: [id number] Arrival date & time: 11/10/20  2213     History Chief Complaint  Patient presents with   Pacemaker Problem    Melanie Cordova is a 60 y.o. female.  60 year old female who presents emerged from today for vibrating in her chest.  Patient has a pacemaker there that her cardiologist told her that is probably ongoing to do it soon.  She states that she felt shaky and she needed to come here.  He she felt this tonight after a jolt.  Now she states that is better and she has no other complaints.       Past Medical History:  Diagnosis Date   Anxiety    Arthritis    Asthma    Automatic implantable cardioverter-defibrillator in situ    2013, july   Bell palsy    states has had 3 episodes   Cardiomyopathy 08/2010   Presented with congestive heart failure; EF of 15% and 2012; hypotension on medication precludes optimal dosing   CHF (congestive heart failure) ([place])    a. EF 20% in 2012 with low-risk NST --> St. Jude ICD implantation by Dr. Lovena Le in 09/2011 b. NST in 12/2017 showing evidence of prior infarct without ischemia c. EF at 45-50% by echo in Q000111Q   Chronic systolic heart failure ([place])    COPD (chronic obstructive pulmonary disease) ([place])    2013   Dysrhythmia    Gastroesophageal reflux disease    Headache(784.0)    Hot flashes 10/04/2016   Hyperlipidemia    Hypertension    09/2010-normal CMet and CBC; Lipid profile-116, 88, 25, 73   Hypothyroidism    Recent TSH was normal.   ICD (implantable cardiac defibrillator) in place 10/10/2011   Neuromuscular disorder (HCC)    Neuropathy, right foot and leg   Neuropathy    Obesity    Pneumonia    Seizures ([place])    last one at age 46   Shortness of breath    Stroke ([place])     stroke in 07/2012 and another in June, 2014   Tobacco abuse    20 pack years    Patient Active Problem List   Diagnosis Date Noted   Automatic implantable  cardioverter-defibrillator problem 11/11/2020   Colorectal polyps    History of colonic polyps 02/20/2019   Constipation 02/20/2019   Chest pain 12/28/2017   Rectocele 10/04/2016   Vaginal discharge 10/04/2016   Screening for colorectal cancer 10/04/2016   Hot flashes 10/04/2016   Vitamin D deficiency 02/26/2016   Cryptogenic stroke ([place]) 08/13/2015   Weakness of face muscles 07/25/2015   Obesity, Class III, BMI 40-49.9 (morbid obesity) ([place]) 03/05/2015   Rectal bleeding Q000111Q   Chronic systolic heart failure ([place]) 05/15/2013   Asthma 04/29/2013   Migraine variant 11/24/2012   Weakness 11/15/2012   Gingival disease 07/25/2012   TIA (transient ischemic attack) s/p tPA 11/07/2011   Automatic implantable cardioverter-defibrillator in situ 10/12/2011   Hypokalemia 10/01/2011   Tobacco abuse    Hyperlipidemia    Hypertension    Gastroesophageal reflux disease    Hypothyroidism    Cardiomyopathy ([place]) 08/13/2010    Past Surgical History:  Procedure Laterality Date   CESAREAN SECTION     X2   COLONOSCOPY N/A 12/16/2013   One simple adenoma and 3 hyperplastic polyps. Small internal hemorrhoids. Left colon redundant. Due for surveillance 2020.    COLONOSCOPY WITH PROPOFOL N/A 06/04/2019   Procedure: COLONOSCOPY  WITH PROPOFOL;  Surgeon: Danie Binder, MD;  Location: [facility];  Service: Endoscopy;  Laterality: N/A;  10:30am   EP IMPLANTABLE DEVICE     St. Jude   EP IMPLANTABLE DEVICE N/A 08/13/2015   Procedure: Loop Recorder Insertion;  Surgeon: Evans Lance, MD;  Location: [place] CV LAB;  Service: Cardiovascular;  Laterality: N/A;   IMPLANTABLE CARDIOVERTER DEFIBRILLATOR IMPLANT N/A 10/10/2011   Procedure: IMPLANTABLE CARDIOVERTER DEFIBRILLATOR IMPLANT;  Surgeon: Evans Lance, MD;  Location: [facility] CATH LAB;  Service: Cardiovascular;  Laterality: N/A;   MULTIPLE EXTRACTIONS WITH ALVEOLOPLASTY N/A 09/24/2012   Procedure: MULTIPLE EXTRACION #2, 4, 6, 7 ,8, 9, 11, 13, 18,  20, 21, 22, 23, 24, 25, 26, 27, 29 WITH ALVEOLOPLASTY, BIOPSY OF PALATE LESION, REMOVA RIGHT LINGUAL TORUS;  Surgeon: Gae Bon, DDS;  Location: [place];  Service: Oral Surgery;  Laterality: N/A;   POLYPECTOMY  06/04/2019   Procedure: POLYPECTOMY;  Surgeon: Danie Binder, MD;  [facility];  Service: Endoscopy;;   TEE WITHOUT CARDIOVERSION N/A 07/11/2012   Procedure: TRANSESOPHAGEAL ECHOCARDIOGRAM (TEE);  Surgeon: Thayer Headings, MD;  Location: [facility] ENDOSCOPY;  Service: Cardiovascular;  Laterality: N/A;   TUBAL LIGATION       OB History     Gravida  3   Para  3   Term  3   Preterm      AB      Living  3      SAB      IAB      Ectopic      Multiple      Live Births  3           Family History  Problem Relation Age of Onset   Cardiomyopathy Mother        ICD pacemaker-ischmic CM   Heart failure Mother    Hypertension Mother    Diabetes Mother    Thyroid disease Mother    Cardiomyopathy Father        Deceased   Coronary artery disease Father    Heart failure Father    Heart failure Brother    Hypertension Brother    Endometriosis Sister    Heart disease Paternal Grandfather    Heart disease Paternal Grandmother    Heart disease Maternal Grandmother    Heart disease Maternal Grandfather    Hypertension Daughter    Obesity Daughter    Colon cancer Neg Hx    Liver disease Neg Hx     Social History   Tobacco Use   Smoking status: Every Day    Packs/day: 0.75    Years: 21.00    Pack years: 15.75    Types: Cigarettes    Start date: 07/02/1983   Smokeless tobacco: Never  Vaping Use   Vaping Use: Former  Substance Use Topics   Alcohol use: No    Alcohol/week: 0.0 standard drinks   Drug use: No    Home Medications Prior to Admission medications   Medication Sig Start Date End Date Taking? Authorizing Provider  acetaminophen (TYLENOL) 650 MG CR tablet Take 650 mg by mouth every 8 (eight) hours as needed for pain.    [provider]  albuterol (PROVENTIL HFA;VENTOLIN HFA) 108 (90 BASE) MCG/ACT inhaler Inhale 2 puffs into the lungs every 6 (six) hours as needed for wheezing or shortness of breath. 07/12/12   Trinda Pascal, MD  aspirin EC 81 MG tablet Take 1 tablet (81  mg total) by mouth daily. 07/03/19   Donzetta Starch, NP  azithromycin (ZITHROMAX) 250 MG tablet Take 1 tablet (250 mg total) by mouth daily. Take first 2 tablets together, then 1 every day until finished. 04/10/20   Henderly, Britni A, PA-C  benzonatate (TESSALON) 100 MG capsule Take 1 capsule (100 mg total) by mouth every 8 (eight) hours. 04/10/20   Henderly, Britni A, PA-C  Biotin 10000 MCG TABS Take 10,000 mcg by mouth daily.     [provider]  budesonide-formoterol (SYMBICORT) 80-4.5 MCG/ACT inhaler Inhale 2 puffs into the lungs 2 (two) times daily.    [provider]  carvedilol (COREG) 12.5 MG tablet Take 25 mg by mouth 2 (two) times daily with a meal.    [provider]  cetirizine (ZYRTEC) 10 MG tablet Take 10 mg by mouth daily.    [provider]  Cholecalciferol (VITAMIN D3) 5000 units CAPS TAKE ONE CAPSULE BY MOUTH DAILY 09/22/17   [name], MD  clobetasol (TEMOVATE) 0.05 % external solution Apply 1 application topically 2 (two) times daily as needed (Apply to scalp.).  07/24/15   [provider]  clopidogrel (PLAVIX) 75 MG tablet Take 1 tablet (75 mg total) by mouth daily. 07/03/19   Rosalin Hawking, MD  diclofenac sodium (VOLTAREN) 1 % GEL Apply 2 g topically 4 (four) times daily as needed (pain).     [provider]  diphenhydrAMINE (BENADRYL) 25 MG tablet Take 25 mg by mouth in the morning and at bedtime.     [provider]  ENTRESTO 24-26 MG TAKE ONE TABLET BY MOUTH 2 TIMES A DAY Patient taking differently: Take 1 tablet by mouth 2 (two) times daily. 12/31/18   Evans Lance, MD  famotidine (PEPCID) 20 MG tablet Take 20 mg by mouth daily as needed for heartburn or  indigestion.    [provider]  Ferrous Sulfate (IRON) 28 MG TABS Take 28 mg by mouth daily.     [provider]  fluticasone (FLONASE) 50 MCG/ACT nasal spray Place 2 sprays into both nostrils daily.    [provider]  furosemide (LASIX) 40 MG tablet Take 1 tablet (40 mg total) by mouth 2 (two) times daily. 08/29/12   Yehuda Savannah, MD  ketoconazole (NIZORAL) 2 % cream Apply 1 application topically 2 (two) times daily.  10/21/15   [provider]  levothyroxine (SYNTHROID, LEVOTHROID) 200 MCG tablet Take 1 tablet (200 mcg total) by mouth daily before breakfast. 03/01/18   Nida, Marella Chimes, MD  LINZESS 145 MCG CAPS capsule Take 145 mcg by mouth daily. 02/12/20   [provider]  Multiple Vitamins-Minerals (CENTRAVITES 50 PLUS PO) Take 1 tablet by mouth daily.     [provider]  pantoprazole (PROTONIX) 20 MG tablet Take 1 tablet (20 mg total) by mouth daily. 06/21/18   Strader, Fransisco Hertz, PA-C  potassium chloride SA (K-DUR) 20 MEQ tablet Take 20 mEq by mouth daily.  05/23/18   [provider]  psyllium (METAMUCIL) 58.6 % packet Take 1 packet by mouth daily.    [provider]  rosuvastatin (CRESTOR) 20 MG tablet Take 20 mg by mouth daily.    [provider]  Simethicone (GAS-X PO) Take 1 tablet by mouth in the morning and at bedtime.     [provider]  topiramate (TOPAMAX) 100 MG tablet Take 1 tablet (100 mg total) by mouth at bedtime. 08/03/20   Suzzanne Cloud, NP  Vitamin D, Ergocalciferol, (DRISDOL) 1.25 MG (50000 UNIT) CAPS capsule Take 50,000 Units by mouth every 30 (thirty) days.    [provider]    Allergies    Fish allergy, Iodinated diagnostic agents, Iodine, Lentil, Penicillins, Lipitor [atorvastatin], Spironolactone, and Sulfa antibiotics  Review of Systems   Review of Systems  All other systems reviewed and are negative.  Physical Exam Updated Vital Signs BP 107/70 (BP  Location: Right Arm)   Pulse 75   Temp (!) 97.3 F (36.3 C) (Oral)   Resp 18   Ht 5' 7.5" (1.715 m)   Wt 115 kg   SpO2 97%   BMI 39.12 kg/m   Physical Exam Vitals and nursing note reviewed.  Constitutional:      Appearance: She is well-developed.  HENT:     Head: Normocephalic and atraumatic.     Nose: Nose normal. No congestion or rhinorrhea.     Mouth/Throat:     Mouth: Mucous membranes are moist.     Pharynx: Oropharynx is clear.  Eyes:     Pupils: Pupils are equal, round, and reactive to light.  Cardiovascular:     Rate and Rhythm: Normal rate and regular rhythm.  Pulmonary:     Effort: No respiratory distress.     Breath sounds: No stridor.  Abdominal:     General: Abdomen is flat. There is no distension.  Musculoskeletal:        General: No swelling or tenderness. Normal range of motion.     Cervical back: Normal range of motion.  Skin:    General: Skin is warm and dry.  Neurological:     General: No focal deficit present.     Mental Status: She is alert.    ED Results / Procedures / Treatments   Labs (all labs ordered are listed, but only abnormal results are displayed) Labs Reviewed  BASIC METABOLIC PANEL - Abnormal; Notable for the following components:      Result Value   Glucose, Bld 109 (*)    Calcium 8.8 (*)    All other components within normal limits  CBG MONITORING, ED - Abnormal; Notable for the following components:   Glucose-Capillary 121 (*)    All other components within normal limits  RESP PANEL BY RT-PCR (FLU A&B, COVID) ARPGX2  CBC WITH DIFFERENTIAL/PLATELET  MAGNESIUM    EKG EKG Interpretation  Date/Time:  Tuesday November 10 2020 23:12:32 EDT Ventricular Rate:  76 PR Interval:  161 QRS Duration: 112 QT Interval:  421 QTC Calculation: 474 R Axis:   -29 Text Interpretation: Sinus rhythm Borderline intraventricular conduction delay Low voltage, precordial leads Abnormal R-wave progression, late transition Borderline T  abnormalities, diffuse leads no sig change from previous Confirmed by Charlesetta Shanks [phone]) on 11/11/2020 2:34:33 PM  Radiology DG Chest Port 1 View  Result Date: 11/10/2020 CLINICAL DATA:  Defibrillator malfunction. EXAM: PORTABLE CHEST 1 VIEW COMPARISON:  Chest radiograph dated 07/08/2020. FINDINGS: Minimal bibasilar atelectasis. No focal consolidation, pleural effusion or pneumothorax. The cardiac silhouette within limits. Loop recorder device. Left pectoral AICD device. The wires are intact. No acute osseous pathology. IMPRESSION: No active disease. Electronically Signed   By: Anner Crete M.D.   On: 11/10/2020 23:29    Procedures Procedures   Medications Ordered in ED Medications - No data to display  ED Course  I have reviewed the triage vital signs and the nursing notes.  Pertinent labs & imaging results that were available during my care of the patient were  reviewed by me and considered in my medical decision making (see chart for details).    MDM Rules/Calculators/A&P                         Interrogated pacer, appears to be at end of life. D/w cardiology, will admit for exchange. Temp orders placed.   Final Clinical Impression(s) / ED Diagnoses Final diagnoses:  Pacemaker complications, initial encounter    Rx / DC Orders ED Discharge Orders     None        [place] Barrell, Corene Cornea, MD 11/12/20 0159

## 2020-11-13 NOTE — Telephone Encounter (Signed)
error 

## 2020-11-18 NOTE — Addendum Note (Signed)
Addended by: Cheri Kearns A on: 11/18/2020 09:48 AM   Modules accepted: Level of Service

## 2020-11-18 NOTE — Progress Notes (Signed)
Remote ICD transmission.   

## 2020-11-24 ENCOUNTER — Other Ambulatory Visit (HOSPITAL_COMMUNITY): Payer: Self-pay | Admitting: Internal Medicine

## 2020-11-24 DIAGNOSIS — Z1231 Encounter for screening mammogram for malignant neoplasm of breast: Secondary | ICD-10-CM

## 2020-11-30 ENCOUNTER — Ambulatory Visit (INDEPENDENT_AMBULATORY_CARE_PROVIDER_SITE_OTHER): Payer: Medicare Other

## 2020-11-30 DIAGNOSIS — I255 Ischemic cardiomyopathy: Secondary | ICD-10-CM

## 2020-12-01 ENCOUNTER — Other Ambulatory Visit: Payer: Self-pay

## 2020-12-01 ENCOUNTER — Ambulatory Visit (INDEPENDENT_AMBULATORY_CARE_PROVIDER_SITE_OTHER): Payer: Medicare Other | Admitting: Internal Medicine

## 2020-12-01 ENCOUNTER — Encounter: Payer: Self-pay | Admitting: Internal Medicine

## 2020-12-01 VITALS — BP 119/73 | HR 85 | Ht 67.5 in | Wt 246.0 lb

## 2020-12-01 DIAGNOSIS — I5022 Chronic systolic (congestive) heart failure: Secondary | ICD-10-CM | POA: Diagnosis not present

## 2020-12-01 NOTE — Progress Notes (Signed)
HPI Melanie Cordova returns today for followup. She is a pleasant 60yo woman with a h/o morbid obesity and a non-ischemic CM. Since her ICD was placed and her EF 15%, she has had a slow and gradual improvement in her LV function and CHF symptoms. Her EF most recently a year ago was 45-50% and she has not received any ICD therapies. She has class 1 symptoms. She has lost over 60 lbs by changing her diet. She eats fruits/vegetables and whole grains. Occaisional lead meat. She has increased her activity. No syncope and no edema.  Allergies  Allergen Reactions   Fish Allergy Anaphylaxis   Iodinated Diagnostic Agents Hives and Other (See Comments)    Pulmonary problems; no frank respiratory arrest   Iodine Hives and Other (See Comments)    Pulmonary Problems    Lentil Anaphylaxis   Penicillins Anaphylaxis and Shortness Of Breath    Has patient had a PCN reaction causing immediate rash, facial/tongue/throat swelling, SOB or lightheadedness with hypotension: Yes Has patient had a PCN reaction causing severe rash involving mucus membranes or skin necrosis: No Has patient had a PCN reaction that required hospitalization No Has patient had a PCN reaction occurring within the last 10 years: No If all of the above answers are "NO", then may proceed with Cephalosporin use.  Hair loss   Lipitor [Atorvastatin] Itching and Rash   Spironolactone Rash   Sulfa Antibiotics Other (See Comments)    Unknown     Current Outpatient Medications  Medication Sig Dispense Refill   acetaminophen (TYLENOL) 650 MG CR tablet Take 650 mg by mouth every 8 (eight) hours as needed for pain.     albuterol (PROVENTIL HFA;VENTOLIN HFA) 108 (90 BASE) MCG/ACT inhaler Inhale 2 puffs into the lungs every 6 (six) hours as needed for wheezing or shortness of breath. 1 Inhaler 5   aspirin EC 81 MG tablet Take 1 tablet (81 mg total) by mouth daily. 21 tablet 0   azithromycin (ZITHROMAX) 250 MG tablet Take 1 tablet (250 mg  total) by mouth daily. Take first 2 tablets together, then 1 every day until finished. 6 tablet 0   benzonatate (TESSALON) 100 MG capsule Take 1 capsule (100 mg total) by mouth every 8 (eight) hours. 21 capsule 0   Biotin 10000 MCG TABS Take 10,000 mcg by mouth daily.      budesonide-formoterol (SYMBICORT) 80-4.5 MCG/ACT inhaler Inhale 2 puffs into the lungs 2 (two) times daily.     carvedilol (COREG) 12.5 MG tablet Take 25 mg by mouth 2 (two) times daily with a meal.     cetirizine (ZYRTEC) 10 MG tablet Take 10 mg by mouth daily.     Cholecalciferol (VITAMIN D3) 5000 units CAPS TAKE ONE CAPSULE BY MOUTH DAILY 90 capsule 0   clobetasol (TEMOVATE) 0.05 % external solution Apply 1 application topically 2 (two) times daily as needed (Apply to scalp.).      clopidogrel (PLAVIX) 75 MG tablet Take 1 tablet (75 mg total) by mouth daily. 21 tablet 0   diclofenac sodium (VOLTAREN) 1 % GEL Apply 2 g topically 4 (four) times daily as needed (pain).      diphenhydrAMINE (BENADRYL) 25 MG tablet Take 25 mg by mouth in the morning and at bedtime.      ENTRESTO 24-26 MG TAKE ONE TABLET BY MOUTH 2 TIMES A DAY (Patient taking differently: Take 1 tablet by mouth 2 (two) times daily.) 60 tablet 0   famotidine (PEPCID) 20  MG tablet Take 20 mg by mouth daily as needed for heartburn or indigestion.     Ferrous Sulfate (IRON) 28 MG TABS Take 28 mg by mouth daily.      fluticasone (FLONASE) 50 MCG/ACT nasal spray Place 2 sprays into both nostrils daily.     furosemide (LASIX) 40 MG tablet Take 1 tablet (40 mg total) by mouth 2 (two) times daily. 180 tablet 0   ketoconazole (NIZORAL) 2 % cream Apply 1 application topically 2 (two) times daily.      levothyroxine (SYNTHROID, LEVOTHROID) 200 MCG tablet Take 1 tablet (200 mcg total) by mouth daily before breakfast. 90 tablet 2   LINZESS 145 MCG CAPS capsule Take 145 mcg by mouth daily.     Multiple Vitamins-Minerals (CENTRAVITES 50 PLUS PO) Take 1 tablet by mouth daily.       pantoprazole (PROTONIX) 20 MG tablet Take 1 tablet (20 mg total) by mouth daily. 90 tablet 1   potassium chloride SA (K-DUR) 20 MEQ tablet Take 20 mEq by mouth daily.      psyllium (METAMUCIL) 58.6 % packet Take 1 packet by mouth daily.     rosuvastatin (CRESTOR) 20 MG tablet Take 20 mg by mouth daily.     Simethicone (GAS-X PO) Take 1 tablet by mouth in the morning and at bedtime.      topiramate (TOPAMAX) 100 MG tablet Take 1 tablet (100 mg total) by mouth at bedtime. 90 tablet 1   Vitamin D, Ergocalciferol, (DRISDOL) 1.25 MG (50000 UNIT) CAPS capsule Take 50,000 Units by mouth every 30 (thirty) days.     No current facility-administered medications for this visit.     Past Medical History:  Diagnosis Date   Anxiety    Arthritis    Asthma    Automatic implantable cardioverter-defibrillator in situ    2013, july   Bell palsy    states has had 3 episodes   Cardiomyopathy 08/2010   Presented with congestive heart failure; EF of 15% and 2012; hypotension on medication precludes optimal dosing   CHF (congestive heart failure) ([place])    a. EF 20% in 2012 with low-risk NST --> St. Jude ICD implantation by Dr. Lovena Le in 09/2011 b. NST in 12/2017 showing evidence of prior infarct without ischemia c. EF at 45-50% by echo in 89/2119   Chronic systolic heart failure ([place])    COPD (chronic obstructive pulmonary disease) ([place])    2013   Dysrhythmia    Gastroesophageal reflux disease    Headache(784.0)    Hot flashes 10/04/2016   Hyperlipidemia    Hypertension    09/2010-normal CMet and CBC; Lipid profile-116, 88, 25, 73   Hypothyroidism    Recent TSH was normal.   ICD (implantable cardiac defibrillator) in place 10/10/2011   Neuromuscular disorder (HCC)    Neuropathy, right foot and leg   Neuropathy    Obesity    Pneumonia    Seizures ([place])    last one at age 38   Shortness of breath    Stroke ([place])     stroke in 07/2012 and another in June, 2014   Tobacco abuse    20 pack years     ROS:   All systems reviewed and negative except as noted in the HPI.   Past Surgical History:  Procedure Laterality Date   CESAREAN SECTION     X2   COLONOSCOPY N/A 12/16/2013   One simple adenoma and 3 hyperplastic polyps. Small internal hemorrhoids. Left colon redundant.  Due for surveillance 2020.    COLONOSCOPY WITH PROPOFOL N/A 06/04/2019   Procedure: COLONOSCOPY WITH PROPOFOL;  Surgeon: Danie Binder, MD;  [facility];  Service: Endoscopy;  Laterality: N/A;  10:30am   EP IMPLANTABLE DEVICE     St. Jude   EP IMPLANTABLE DEVICE N/A 08/13/2015   Procedure: Loop Recorder Insertion;  Surgeon: Evans Lance, MD;  Location: [place] CV LAB;  Service: Cardiovascular;  Laterality: N/A;   IMPLANTABLE CARDIOVERTER DEFIBRILLATOR IMPLANT N/A 10/10/2011   Procedure: IMPLANTABLE CARDIOVERTER DEFIBRILLATOR IMPLANT;  Surgeon: Evans Lance, MD;  Location: [facility] CATH LAB;  Service: Cardiovascular;  Laterality: N/A;   MULTIPLE EXTRACTIONS WITH ALVEOLOPLASTY N/A 09/24/2012   Procedure: MULTIPLE EXTRACION #2, 4, 6, 7 ,8, 9, 11, 13, 18, 20, 21, 22, 23, 24, 25, 26, 27, 29 WITH ALVEOLOPLASTY, BIOPSY OF PALATE LESION, REMOVA RIGHT LINGUAL TORUS;  Surgeon: Gae Bon, DDS;  Location: [place];  Service: Oral Surgery;  Laterality: N/A;   POLYPECTOMY  06/04/2019   Procedure: POLYPECTOMY;  Surgeon: Danie Binder, MD;  [facility];  Service: Endoscopy;;   TEE WITHOUT CARDIOVERSION N/A 07/11/2012   Procedure: TRANSESOPHAGEAL ECHOCARDIOGRAM (TEE);  Surgeon: Thayer Headings, MD;  Location: [place];  Service: Cardiovascular;  Laterality: N/A;   TUBAL LIGATION       Family History  Problem Relation Age of Onset   Cardiomyopathy Mother        ICD pacemaker-ischmic CM   Heart failure Mother    Hypertension Mother    Diabetes Mother    Thyroid disease Mother    Cardiomyopathy Father        Deceased   Coronary artery disease Father    Heart failure Father    Heart failure  Brother    Hypertension Brother    Endometriosis Sister    Heart disease Paternal Grandfather    Heart disease Paternal Grandmother    Heart disease Maternal Grandmother    Heart disease Maternal Grandfather    Hypertension Daughter    Obesity Daughter    Colon cancer Neg Hx    Liver disease Neg Hx      Social History   Socioeconomic History   Marital status: Divorced    Spouse name: Not on file   Number of children: 2   Years of education: Not on file   Highest education level: Not on file  Occupational History    Employer: [employer]    Comment: Works in Environmental consultant  Tobacco Use   Smoking status: Every Day    Packs/day: 0.75    Years: 21.00    Pack years: 15.75    Types: Cigarettes    Start date: 07/02/1983   Smokeless tobacco: Never  Vaping Use   Vaping Use: Former  Substance and Sexual Activity   Alcohol use: No    Alcohol/week: 0.0 standard drinks   Drug use: No   Sexual activity: Not Currently    Birth control/protection: Abstinence, Surgical    Comment: tubal  Other Topics Concern   Not on file  Social History Narrative   Not on file   Social Determinants of Health   Financial Resource Strain: Not on file  Food Insecurity: Not on file  Transportation Needs: Not on file  Physical Activity: Not on file  Stress: Not on file  Social Connections: Not on file  Intimate Partner Violence: Not on file     BP 119/73   Pulse 85  Ht 5' 7.5" (1.715 m)   Wt 246 lb (111.6 kg)   BMI 37.96 kg/m   Physical Exam:  Well appearing overweight 60 yo woman, NAD HEENT: Unremarkable Neck:  No JVD, no thyromegally Lymphatics:  No adenopathy Back:  No CVA tenderness Lungs:  Clear with no wheezes HEART:  Regular rate rhythm, no murmurs, no rubs, no clicks Abd:  soft, positive bowel sounds, no organomegally, no rebound, no guarding Ext:  2 plus pulses, no edema, no cyanosis, no clubbing Skin:  No rashes no nodules Neuro:  CN II through XII intact,  motor grossly intact  DEVICE  Normal device function.  See PaceArt for details. ERI  Assess/Plan:  Chronic systolic heart failure - her EF has improved and her CHF is class 1. I have recommended she continue her current meds. ICD - as her EF has improved I do not recommend placing an additional device. I would suggest she repeat her echon in April, 2 years after her last echo to confirm that the EF remains good.  Obesity - she continues to lose weight. I have encouraged her to try and get under 200 lbs. HTN - her bp is well controlled. She will continue her current meds.  Carleene Overlie Lunell Robart,MD

## 2020-12-01 NOTE — Patient Instructions (Signed)
Medication Instructions:  Your physician recommends that you continue on your current medications as directed. Please refer to the Current Medication list given to you today.  *If you need a refill on your cardiac medications before your next appointment, please call your pharmacy*   Lab Work: NONE   If you have labs (blood work) drawn today and your tests are completely normal, you will receive your results only by: [place] (if you have MyChart) OR A paper copy in the mail If you have any lab test that is abnormal or we need to change your treatment, we will call you to review the results.   Testing/Procedures: Your physician has requested that you have an echocardiogram. Echocardiography is a painless test that uses sound waves to create images of your heart. It provides your doctor with information about the size and shape of your heart and how well your heart's chambers and valves are working. This procedure takes approximately one hour. There are no restrictions for this procedure.    Follow-Up: At [facility], you and your health needs are our priority.  As part of our continuing mission to provide you with exceptional heart care, we have created designated Provider Care Teams.  These Care Teams include your primary Cardiologist (physician) and Advanced Practice Providers (APPs -  Physician Assistants and Nurse Practitioners) who all work together to provide you with the care you need, when you need it.  We recommend signing up for the patient portal called "MyChart".  Sign up information is provided on this After Visit Summary.  MyChart is used to connect with patients for Virtual Visits (Telemedicine).  Patients are able to view lab/test results, encounter notes, upcoming appointments, etc.  Non-urgent messages can be sent to your provider as well.   To learn more about what you can do with MyChart, go to [email or web address].    Your next appointment:    April    The format for your next appointment:   In Person  Provider:   [name], MD   Other Instructions Thank you for choosing [facility]! ]

## 2020-12-02 ENCOUNTER — Other Ambulatory Visit: Payer: Self-pay | Admitting: Internal Medicine

## 2020-12-02 LAB — CUP PACEART REMOTE DEVICE CHECK
Battery Remaining Longevity: 0 mo
Battery Voltage: 2.59 V
Brady Statistic RV Percent Paced: 1 %
Date Time Interrogation Session: 20220919234739
HighPow Impedance: 90 Ohm
HighPow Impedance: 90 Ohm
Implantable Lead Implant Date: 20130729
Implantable Lead Location: 753860
Implantable Lead Model: 181
Implantable Lead Serial Number: 322336
Implantable Pulse Generator Implant Date: 20130729
Lead Channel Impedance Value: 610 Ohm
Lead Channel Pacing Threshold Amplitude: 0.75 V
Lead Channel Pacing Threshold Pulse Width: 0.5 ms
Lead Channel Sensing Intrinsic Amplitude: 9.9 mV
Lead Channel Setting Pacing Amplitude: 2.5 V
Lead Channel Setting Pacing Pulse Width: 0.5 ms
Lead Channel Setting Sensing Sensitivity: 0.5 mV
Pulse Gen Serial Number: 1022442

## 2020-12-07 NOTE — Addendum Note (Signed)
Addended by: Cheri Kearns A on: 12/07/2020 03:29 PM   Modules accepted: Level of Service

## 2020-12-07 NOTE — Progress Notes (Signed)
Remote ICD transmission.   

## 2020-12-21 ENCOUNTER — Ambulatory Visit (HOSPITAL_COMMUNITY): Payer: Medicare Other

## 2020-12-28 ENCOUNTER — Ambulatory Visit (HOSPITAL_COMMUNITY): Payer: Medicare Other

## 2020-12-31 ENCOUNTER — Ambulatory Visit (INDEPENDENT_AMBULATORY_CARE_PROVIDER_SITE_OTHER): Payer: Medicare Other

## 2020-12-31 DIAGNOSIS — I5022 Chronic systolic (congestive) heart failure: Secondary | ICD-10-CM

## 2021-01-03 LAB — CUP PACEART REMOTE DEVICE CHECK
Battery Remaining Longevity: 0 mo
Battery Voltage: 2.57 V
Brady Statistic RV Percent Paced: 1 %
Date Time Interrogation Session: 20221020235900
HighPow Impedance: 80 Ohm
HighPow Impedance: 80 Ohm
Implantable Lead Implant Date: 20130729
Implantable Lead Location: 753860
Implantable Lead Model: 181
Implantable Lead Serial Number: 322336
Implantable Pulse Generator Implant Date: 20130729
Lead Channel Impedance Value: 600 Ohm
Lead Channel Pacing Threshold Amplitude: 0.75 V
Lead Channel Pacing Threshold Pulse Width: 0.5 ms
Lead Channel Sensing Intrinsic Amplitude: 12 mV
Lead Channel Setting Pacing Amplitude: 2.5 V
Lead Channel Setting Pacing Pulse Width: 0.5 ms
Lead Channel Setting Sensing Sensitivity: 0.5 mV
Pulse Gen Serial Number: 1022442

## 2021-01-08 ENCOUNTER — Other Ambulatory Visit: Payer: Self-pay

## 2021-01-08 ENCOUNTER — Ambulatory Visit (HOSPITAL_COMMUNITY)
Admission: RE | Admit: 2021-01-08 | Discharge: 2021-01-08 | Disposition: A | Discharge status: Home / Self Care | Payer: Medicare Other | Source: Ambulatory Visit | Attending: Internal Medicine | Admitting: Internal Medicine

## 2021-01-08 DIAGNOSIS — Z1231 Encounter for screening mammogram for malignant neoplasm of breast: Secondary | ICD-10-CM | POA: Diagnosis present

## 2021-01-08 NOTE — Progress Notes (Signed)
Remote ICD transmission.   

## 2021-01-08 NOTE — Addendum Note (Signed)
Addended by: Douglass Rivers D on: 01/08/2021 11:01 AM   Modules accepted: Level of Service

## 2021-01-26 ENCOUNTER — Other Ambulatory Visit: Payer: Self-pay | Admitting: Neurology

## 2021-02-08 ENCOUNTER — Telehealth: Payer: Self-pay | Admitting: Neurology

## 2021-02-08 NOTE — Telephone Encounter (Signed)
LVM & sent mychart msg informing pt of provider change for 12/1 appt. Judson Roch will be out of office. R/s pt to see Dr. Billey Gosling so that she can become established with her now that Dr. Jannifer Franklin is retired. Pt is seen here for migraines.

## 2021-02-11 ENCOUNTER — Telehealth: Payer: Self-pay | Admitting: *Deleted

## 2021-02-11 ENCOUNTER — Encounter: Payer: Self-pay | Admitting: Psychiatry

## 2021-02-11 ENCOUNTER — Ambulatory Visit: Payer: Medicare Other | Admitting: Neurology

## 2021-02-11 ENCOUNTER — Ambulatory Visit (INDEPENDENT_AMBULATORY_CARE_PROVIDER_SITE_OTHER): Payer: Medicare Other | Admitting: Psychiatry

## 2021-02-11 ENCOUNTER — Other Ambulatory Visit: Payer: Self-pay

## 2021-02-11 VITALS — BP 138/85 | HR 86 | Ht 67.0 in | Wt 236.0 lb

## 2021-02-11 DIAGNOSIS — G43409 Hemiplegic migraine, not intractable, without status migrainosus: Secondary | ICD-10-CM | POA: Diagnosis not present

## 2021-02-11 MED ORDER — UBRELVY 50 MG PO TABS: 50.0000 mg | ORAL_TABLET | ORAL | 2 refills | Status: DC | PRN

## 2021-02-11 MED ORDER — CYCLOBENZAPRINE HCL 5 MG PO TABS: 5.0000 mg | ORAL_TABLET | Freq: Every day | ORAL | 1 refills | Status: DC

## 2021-02-11 NOTE — Patient Instructions (Signed)
Take Flexeril (muscle relaxer) at bedtime as needed to help with headaches and sleep Take Roselyn Meier as needed at the first sign of migraine. Can repeat a dose in 2 hours if headaches persist

## 2021-02-11 NOTE — Telephone Encounter (Signed)
Ubrelvy PA< key [id number], G43.409. has contraindications to preferred: triptans due to cardiac history. Your information has been sent to [facility]

## 2021-02-11 NOTE — Progress Notes (Signed)
   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 Also has visual aura, photophobia, nausea, and vomiting. Rarely gets left-sided weakness.  At her last visit, Topamax was increased to 100 mg QHS  Interval History: Her migraines are mostly at night which interfere with her sleep. She has been getting 2-3 migraines per week in the past couple of weeks. Headaches have lasted up to 2 days at a time. Notes some increased stress due to family issues. Before her increased stress her headaches were well-controlled. Takes Tylenol as needed which helps a little bit.  Her EF has been improving, for now Cardiology is planning on keeping her ICD in.   Headache days per month: 12 Headache free days per month: 18  Current Headache Regimen: Preventative: Topamax 100 mg QHS Abortive: Tylenol   Prior Therapies                                  Topamax 100 mg QHS Carvedilol 25 mg BID  Physical Exam:   Vital Signs: BP 138/85   Pulse 86   Ht 5\' 7"  (1.702 m)   Wt 236 lb (107 kg)   BMI 36.96 kg/m  GENERAL:  well appearing, in no acute distress, alert  SKIN:  Color, texture, turgor normal. No rashes or lesions HEAD:  Normocephalic/atraumatic. RESP: normal respiratory effort MSK:  No gross joint deformities. No tenderness to palpation over occiput, neck, or shoulders.  NEUROLOGICAL: Mental Status: Alert, oriented to person, place and time, Follows commands, and Speech fluent and appropriate. Cranial Nerves: PERRL, face symmetric, no dysarthria, hearing grossly intact Motor: moves all extremities equally Gait: normal-based.  IMPRESSION: 60 year old female with a history of cerebellar infarct, asthma, cardiomyopathy s/p ICD, COPD, HTN, HLD, childhood seizures, who follows in clinic for hemiplegic  migraine. Headaches were well-controlled until a few weeks ago when her stress level increased. Will start Flexeril as-needed at bedtime to help with her headaches and sleep. Triptans are contraindicated due to history of CVA and hemiplegic migraine. Will start Ubrelvy for rescue. If this is not enough to break her current headache cycle may consider switching to a new preventive.   PLAN: -Preventive: Topamax 100 mg QHS -Rescue: Ubrelvy 50 mg PRN for migraine. Flexeril 5 mg QHS PRN for headaches and sleep -next steps: consider SNRI, CGRP, Botox   Follow-up: 6 months  I spent a total of 30 minutes on the date of the service. Headache education was done. Discussed treatment options including preventive and acute medications, natural supplements, and infusion therapy. Discussed medication side effects, adverse reactions and drug interactions. Written educational materials and patient instructions outlining all of the above were given.  Genia Harold, MD 02/11/21 9:13 AM

## 2021-02-11 NOTE — Telephone Encounter (Signed)
UBRELVY TAB 50MG , use as directed, is approved for non-formulary exception through 03/13/2022 under your Medicare Part D benefit. Faxed approval to pharmacy.

## 2021-02-12 ENCOUNTER — Other Ambulatory Visit: Payer: Self-pay | Admitting: Psychiatry

## 2021-04-03 ENCOUNTER — Other Ambulatory Visit: Payer: Self-pay | Admitting: Psychiatry

## 2021-06-07 ENCOUNTER — Ambulatory Visit (HOSPITAL_COMMUNITY)
Admission: RE | Admit: 2021-06-07 | Discharge: 2021-06-07 | Disposition: A | Discharge status: Home / Self Care | Payer: Medicare Other | Source: Ambulatory Visit | Attending: Internal Medicine | Admitting: Internal Medicine

## 2021-06-07 DIAGNOSIS — I5022 Chronic systolic (congestive) heart failure: Secondary | ICD-10-CM | POA: Insufficient documentation

## 2021-06-07 LAB — ECHOCARDIOGRAM COMPLETE
Area-P 1/2: 3.42 cm2
Calc EF: 44.4 %
MV M vel: 4.95 m/s
MV Peak grad: 98 mmHg
Radius: 0.4 cm
S' Lateral: 5.15 cm
Single Plane A2C EF: 39.7 %
Single Plane A4C EF: 48.5 %

## 2021-06-07 NOTE — Progress Notes (Signed)
*  PRELIMINARY RESULTS* ?Echocardiogram ?2D Echocardiogram has been performed. ? ?Melanie Cordova ?06/07/2021, 10:19 AM ?

## 2021-06-09 ENCOUNTER — Telehealth: Payer: Self-pay

## 2021-06-09 NOTE — Telephone Encounter (Signed)
Patient notified and verbalized understanding. Pt had no questions or concerns at this time. PCP copied.  ?

## 2021-07-07 ENCOUNTER — Other Ambulatory Visit: Payer: Self-pay | Admitting: Neurology

## 2021-08-12 ENCOUNTER — Ambulatory Visit: Payer: Medicare Other | Admitting: Psychiatry

## 2021-10-19 ENCOUNTER — Other Ambulatory Visit: Payer: Self-pay | Admitting: Psychiatry

## 2021-12-20 ENCOUNTER — Other Ambulatory Visit (HOSPITAL_COMMUNITY): Payer: Self-pay | Admitting: Internal Medicine

## 2021-12-20 DIAGNOSIS — Z1231 Encounter for screening mammogram for malignant neoplasm of breast: Secondary | ICD-10-CM

## 2022-01-05 ENCOUNTER — Encounter: Payer: Self-pay | Admitting: Psychiatry

## 2022-01-05 ENCOUNTER — Ambulatory Visit: Payer: Medicare Other | Admitting: Psychiatry

## 2022-01-05 NOTE — Progress Notes (Deleted)
   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 Also has visual aura, photophobia, nausea, and vomiting. Rarely gets left-sided weakness.   At her last visit she was started on Ubrelvy for migraine rescue. Topamax was continued for prevention. Interval History: ***   Headache days per month: *** Migraine days per month*** Headache free days per month: ***  Current Headache Regimen: Preventative: Topamax 100 mg QHS Abortive: Ubrelvy 50 mg PRN   Prior Therapies                                  Topamax 100 mg QHS Carvedilol 25 mg BID  Physical Exam:   Vital Signs: There were no vitals taken for this visit. GENERAL:  well appearing, in no acute distress, alert  SKIN:  Color, texture, turgor normal. No rashes or lesions HEAD:  Normocephalic/atraumatic. RESP: normal respiratory effort MSK:  No gross joint deformities.   NEUROLOGICAL: Mental Status: Alert, oriented to person, place and time, Follows commands, and Speech fluent and appropriate. Cranial Nerves: PERRL, face symmetric, no dysarthria, hearing grossly intact Motor: moves all extremities equally Gait: normal-based.  IMPRESSION: ***  PLAN: ***   Follow-up: ***  I spent a total of *** minutes on the date of the service. Headache education was done. Discussed lifestyle modification including increased oral hydration, decreased caffeine, exercise and stress management. Discussed treatment options including preventive and acute medications, natural supplements, and infusion therapy. Discussed medication overuse headache and to limit use of acute treatments to no more than 2 days/week or 10 days/month. Discussed medication side effects, adverse reactions and drug interactions. Written educational materials and patient  instructions outlining all of the above were given.  Genia Harold, MD

## 2022-01-12 ENCOUNTER — Ambulatory Visit (HOSPITAL_COMMUNITY)
Admission: RE | Admit: 2022-01-12 | Discharge: 2022-01-12 | Disposition: A | Discharge status: Home / Self Care | Payer: Medicare Other | Source: Ambulatory Visit | Attending: Internal Medicine | Admitting: Internal Medicine

## 2022-01-12 DIAGNOSIS — Z1231 Encounter for screening mammogram for malignant neoplasm of breast: Secondary | ICD-10-CM | POA: Diagnosis present

## 2022-01-17 NOTE — Progress Notes (Deleted)
   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   At her last visit she was continued on Topamax and started on Ubrelvy for migraine rescue. Flexeril PRN was started for muscle spasms.  Interval History: ***   Headache days per month: *** Migraine days per month*** Headache free days per month: ***  Current Headache Regimen: Preventative: *** Abortive: ***   Prior Therapies                                  Topamax 100 mg QHS Carvedilol 25 mg BID Ubrelvy 50 mg PRN  Physical Exam:   Vital Signs: There were no vitals taken for this visit. GENERAL:  well appearing, in no acute distress, alert  SKIN:  Color, texture, turgor normal. No rashes or lesions HEAD:  Normocephalic/atraumatic. RESP: normal respiratory effort MSK:  No gross joint deformities.   NEUROLOGICAL: Mental Status: Alert, oriented to person, place and time, Follows commands, and Speech fluent and appropriate. Cranial Nerves: PERRL, face symmetric, no dysarthria, hearing grossly intact Motor: moves all extremities equally Gait: normal-based.  IMPRESSION: ***  PLAN: ***   Follow-up: ***  I spent a total of *** minutes on the date of the service. Headache education was done. Discussed lifestyle modification including increased oral hydration, decreased caffeine, exercise and stress management. Discussed treatment options including preventive and acute medications, natural supplements, and infusion therapy. Discussed medication overuse headache and to limit use of acute treatments to no more than 2 days/week or 10 days/month. Discussed medication side effects, adverse reactions and drug interactions. Written educational materials and patient instructions outlining all of the above were given.  Genia Harold, MD

## 2022-01-18 ENCOUNTER — Ambulatory Visit: Payer: Medicare Other | Admitting: Psychiatry

## 2022-02-28 ENCOUNTER — Emergency Department (HOSPITAL_COMMUNITY): Payer: Medicare Other

## 2022-02-28 ENCOUNTER — Observation Stay (HOSPITAL_COMMUNITY)
Admission: EM | Admit: 2022-02-28 | Discharge: 2022-03-02 | Disposition: A | Discharge status: Home / Self Care | Payer: Medicare Other | Attending: Internal Medicine | Admitting: Internal Medicine

## 2022-02-28 ENCOUNTER — Encounter (HOSPITAL_COMMUNITY): Payer: Self-pay | Admitting: Internal Medicine

## 2022-02-28 DIAGNOSIS — N135 Crossing vessel and stricture of ureter without hydronephrosis: Secondary | ICD-10-CM

## 2022-02-28 DIAGNOSIS — Z8673 Personal history of transient ischemic attack (TIA), and cerebral infarction without residual deficits: Secondary | ICD-10-CM | POA: Insufficient documentation

## 2022-02-28 DIAGNOSIS — J069 Acute upper respiratory infection, unspecified: Secondary | ICD-10-CM | POA: Diagnosis not present

## 2022-02-28 DIAGNOSIS — R10A0 Flank pain, unspecified side: Secondary | ICD-10-CM | POA: Diagnosis present

## 2022-02-28 DIAGNOSIS — N111 Chronic obstructive pyelonephritis: Secondary | ICD-10-CM | POA: Insufficient documentation

## 2022-02-28 DIAGNOSIS — Z7902 Long term (current) use of antithrombotics/antiplatelets: Secondary | ICD-10-CM | POA: Diagnosis not present

## 2022-02-28 DIAGNOSIS — Z79899 Other long term (current) drug therapy: Secondary | ICD-10-CM | POA: Insufficient documentation

## 2022-02-28 DIAGNOSIS — J449 Chronic obstructive pulmonary disease, unspecified: Secondary | ICD-10-CM | POA: Diagnosis present

## 2022-02-28 DIAGNOSIS — I5042 Chronic combined systolic (congestive) and diastolic (congestive) heart failure: Secondary | ICD-10-CM | POA: Insufficient documentation

## 2022-02-28 DIAGNOSIS — Z7982 Long term (current) use of aspirin: Secondary | ICD-10-CM | POA: Insufficient documentation

## 2022-02-28 DIAGNOSIS — N179 Acute kidney failure, unspecified: Secondary | ICD-10-CM | POA: Diagnosis present

## 2022-02-28 DIAGNOSIS — E785 Hyperlipidemia, unspecified: Secondary | ICD-10-CM | POA: Diagnosis present

## 2022-02-28 DIAGNOSIS — R1031 Right lower quadrant pain: Secondary | ICD-10-CM | POA: Diagnosis present

## 2022-02-28 DIAGNOSIS — I10 Essential (primary) hypertension: Secondary | ICD-10-CM | POA: Diagnosis present

## 2022-02-28 DIAGNOSIS — Z1152 Encounter for screening for COVID-19: Secondary | ICD-10-CM | POA: Insufficient documentation

## 2022-02-28 DIAGNOSIS — R109 Unspecified abdominal pain: Secondary | ICD-10-CM | POA: Diagnosis present

## 2022-02-28 DIAGNOSIS — J45909 Unspecified asthma, uncomplicated: Secondary | ICD-10-CM | POA: Insufficient documentation

## 2022-02-28 DIAGNOSIS — N1831 Chronic kidney disease, stage 3a: Secondary | ICD-10-CM | POA: Insufficient documentation

## 2022-02-28 DIAGNOSIS — E039 Hypothyroidism, unspecified: Secondary | ICD-10-CM | POA: Diagnosis not present

## 2022-02-28 DIAGNOSIS — N200 Calculus of kidney: Principal | ICD-10-CM

## 2022-02-28 DIAGNOSIS — N12 Tubulo-interstitial nephritis, not specified as acute or chronic: Secondary | ICD-10-CM

## 2022-02-28 DIAGNOSIS — Z7952 Long term (current) use of systemic steroids: Secondary | ICD-10-CM | POA: Diagnosis not present

## 2022-02-28 DIAGNOSIS — F17210 Nicotine dependence, cigarettes, uncomplicated: Secondary | ICD-10-CM | POA: Diagnosis not present

## 2022-02-28 DIAGNOSIS — D72829 Elevated white blood cell count, unspecified: Secondary | ICD-10-CM | POA: Insufficient documentation

## 2022-02-28 DIAGNOSIS — I130 Hypertensive heart and chronic kidney disease with heart failure and stage 1 through stage 4 chronic kidney disease, or unspecified chronic kidney disease: Secondary | ICD-10-CM | POA: Diagnosis not present

## 2022-02-28 DIAGNOSIS — N390 Urinary tract infection, site not specified: Secondary | ICD-10-CM | POA: Diagnosis not present

## 2022-02-28 LAB — URINALYSIS, ROUTINE W REFLEX MICROSCOPIC
Bilirubin Urine: NEGATIVE
Glucose, UA: NEGATIVE mg/dL
Ketones, ur: NEGATIVE mg/dL
Nitrite: POSITIVE — AB
Protein, ur: >=300 mg/dL — AB
Specific Gravity, Urine: 1.013 (ref 1.005–1.030)
WBC, UA: >50 WBC/hpf — ABNORMAL HIGH (ref 0–5)
pH: 7 (ref 5.0–8.0)

## 2022-02-28 LAB — CBC WITH DIFFERENTIAL/PLATELET
Abs Immature Granulocytes: 0.14 10*3/uL — ABNORMAL HIGH (ref 0.00–0.07)
Basophils Absolute: 0 10*3/uL (ref 0.0–0.1)
Basophils Relative: 0 %
Eosinophils Absolute: 0.1 10*3/uL (ref 0.0–0.5)
Eosinophils Relative: 0 %
HCT: 42.6 % (ref 36.0–46.0)
Hemoglobin: 13.9 g/dL (ref 12.0–15.0)
Immature Granulocytes: 1 %
Lymphocytes Relative: 5 %
Lymphs Abs: 0.7 10*3/uL (ref 0.7–4.0)
MCH: 30 pg (ref 26.0–34.0)
MCHC: 32.6 g/dL (ref 30.0–36.0)
MCV: 92 fL (ref 80.0–100.0)
Monocytes Absolute: 1.4 10*3/uL — ABNORMAL HIGH (ref 0.1–1.0)
Monocytes Relative: 9 %
Neutro Abs: 12.6 10*3/uL — ABNORMAL HIGH (ref 1.7–7.7)
Neutrophils Relative %: 85 %
Platelets: 180 10*3/uL (ref 150–400)
RBC: 4.63 MIL/uL (ref 3.87–5.11)
RDW: 13.7 % (ref 11.5–15.5)
WBC: 14.9 10*3/uL — ABNORMAL HIGH (ref 4.0–10.5)
nRBC: 0 % (ref 0.0–0.2)

## 2022-02-28 LAB — COMPREHENSIVE METABOLIC PANEL
ALT: 23 U/L (ref 0–44)
AST: 20 U/L (ref 15–41)
Albumin: 3.3 g/dL — ABNORMAL LOW (ref 3.5–5.0)
Alkaline Phosphatase: 90 U/L (ref 38–126)
Anion gap: 10 (ref 5–15)
BUN: 29 mg/dL — ABNORMAL HIGH (ref 6–20)
CO2: 20 mmol/L — ABNORMAL LOW (ref 22–32)
Calcium: 9.3 mg/dL (ref 8.9–10.3)
Chloride: 107 mmol/L (ref 98–111)
Creatinine, Ser: 1.24 mg/dL — ABNORMAL HIGH (ref 0.44–1.00)
GFR, Estimated: 50 mL/min — ABNORMAL LOW (ref >60)
Glucose, Bld: 139 mg/dL — ABNORMAL HIGH (ref 70–99)
Potassium: 4.1 mmol/L (ref 3.5–5.1)
Sodium: 137 mmol/L (ref 135–145)
Total Bilirubin: 0.9 mg/dL (ref 0.3–1.2)
Total Protein: 6.9 g/dL (ref 6.5–8.1)

## 2022-02-28 LAB — RESP PANEL BY RT-PCR (RSV, FLU A&B, COVID)  RVPGX2
Influenza A by PCR: NEGATIVE
Influenza B by PCR: NEGATIVE
Resp Syncytial Virus by PCR: NEGATIVE
SARS Coronavirus 2 by RT PCR: NEGATIVE

## 2022-02-28 LAB — LIPASE, BLOOD: Lipase: 34 U/L (ref 11–51)

## 2022-02-28 MED ORDER — SODIUM CHLORIDE 0.9 % IV BOLUS
500.0000 mL | Freq: Once | INTRAVENOUS | Status: AC
  Administered 2022-02-28: 500 mL via INTRAVENOUS

## 2022-02-28 MED ORDER — LEVOFLOXACIN IN D5W 500 MG/100ML IV SOLN
500.0000 mg | Freq: Once | INTRAVENOUS | Status: AC
  Administered 2022-02-28: 500 mg via INTRAVENOUS
  Filled 2022-02-28: qty 100

## 2022-02-28 MED ORDER — SODIUM CHLORIDE 0.9 % IV SOLN: Freq: Once | INTRAVENOUS | Status: AC

## 2022-02-28 MED ORDER — MORPHINE SULFATE (PF) 4 MG/ML IV SOLN
4.0000 mg | Freq: Once | INTRAVENOUS | Status: AC
  Administered 2022-02-28: 4 mg via INTRAVENOUS
  Filled 2022-02-28: qty 1

## 2022-02-28 NOTE — ED Provider Notes (Signed)
[facility], [place] EMERGENCY DEPARTMENT Provider Note   CSN: [id number] Arrival date & time: 02/28/22  1526     History {Add pertinent medical, surgical, social history, OB history to HPI:1} Chief Complaint  Patient presents with   URI    Melanie Cordova is a 61 y.o. female.  She is here with a complaint of some difficulty passing urine, lower abdominal pain, no stool output for 4 days, feeling very gassy.  Having difficulty eating due to the gas in her abdomen.  She has had a cough and feels generally unwell.  No hematuria.  She said she called her primary care doctor who recommended she come to the emergency department for evaluation.  The history is provided by the patient.  Abdominal Pain Pain location:  LLQ, RLQ and suprapubic Pain quality: aching, bloating and cramping   Pain severity:  Moderate Onset quality:  Gradual Duration:  4 days Timing:  Constant Progression:  Unchanged Chronicity:  New Context: not trauma   Relieved by:  Nothing Worsened by:  Nothing Ineffective treatments:  Position changes Associated symptoms: constipation, cough, fatigue, fever, flatus, nausea, shortness of breath and vomiting   Associated symptoms: no chest pain, no diarrhea, no dysuria, no hematemesis, no hematochezia, no hematuria and no sore throat        Home Medications Prior to Admission medications   Medication Sig Start Date End Date Taking? Authorizing Provider  acetaminophen (TYLENOL) 650 MG CR tablet Take 650 mg by mouth every 8 (eight) hours as needed for pain.    [provider]  albuterol (PROVENTIL HFA;VENTOLIN HFA) 108 (90 BASE) MCG/ACT inhaler Inhale 2 puffs into the lungs every 6 (six) hours as needed for wheezing or shortness of breath. 07/12/12   Trinda Pascal, MD  aspirin EC 81 MG tablet Take 1 tablet (81 mg total) by mouth daily. 07/03/19   Donzetta Starch, NP  azithromycin (ZITHROMAX) 250 MG tablet Take 1 tablet (250 mg total) by mouth daily. Take first 2  tablets together, then 1 every day until finished. 04/10/20   Henderly, Britni A, PA-C  benzonatate (TESSALON) 100 MG capsule Take 1 capsule (100 mg total) by mouth every 8 (eight) hours. 04/10/20   Henderly, Britni A, PA-C  Biotin 10000 MCG TABS Take 10,000 mcg by mouth daily.     [provider]  budesonide-formoterol (SYMBICORT) 80-4.5 MCG/ACT inhaler Inhale 2 puffs into the lungs 2 (two) times daily.    [provider]  carvedilol (COREG) 12.5 MG tablet Take 25 mg by mouth 2 (two) times daily with a meal.    [provider]  cetirizine (ZYRTEC) 10 MG tablet Take 10 mg by mouth daily.    [provider]  Cholecalciferol (VITAMIN D3) 5000 units CAPS TAKE ONE CAPSULE BY MOUTH DAILY 09/22/17   [name], MD  clobetasol (TEMOVATE) 0.05 % external solution Apply 1 application topically 2 (two) times daily as needed (Apply to scalp.).  07/24/15   [provider]  clopidogrel (PLAVIX) 75 MG tablet Take 1 tablet (75 mg total) by mouth daily. 07/03/19   Rosalin Hawking, MD  cyclobenzaprine (FLEXERIL) 5 MG tablet TAKE ONE TABLET BY MOUTH AT BEDTIME 04/05/21   Genia Harold, MD  diclofenac sodium (VOLTAREN) 1 % GEL Apply 2 g topically 4 (four) times daily as needed (pain).     [provider]  diphenhydrAMINE (BENADRYL) 25 MG tablet Take 25 mg by mouth in the morning and at bedtime.     [provider]  ENTRESTO 24-26 MG TAKE ONE TABLET BY MOUTH 2 TIMES A DAY Patient taking differently: Take 1 tablet by mouth 2 (two) times daily. 12/31/18   Evans Lance, MD  famotidine (PEPCID) 20 MG tablet Take 20 mg by mouth daily as needed for heartburn or indigestion.    [provider]  Ferrous Sulfate (IRON) 28 MG TABS Take 28 mg by mouth daily.     [provider]  fluticasone (FLONASE) 50 MCG/ACT nasal spray Place 2 sprays into both nostrils daily.    [provider]  furosemide (LASIX) 40 MG tablet Take 1 tablet (40 mg  total) by mouth 2 (two) times daily. 08/29/12   Yehuda Savannah, MD  ketoconazole (NIZORAL) 2 % cream Apply 1 application topically 2 (two) times daily.  10/21/15   [provider]  levothyroxine (SYNTHROID, LEVOTHROID) 200 MCG tablet Take 1 tablet (200 mcg total) by mouth daily before breakfast. 03/01/18   Nida, Marella Chimes, MD  LINZESS 145 MCG CAPS capsule Take 145 mcg by mouth daily. 02/12/20   [provider]  Multiple Vitamins-Minerals (CENTRAVITES 50 PLUS PO) Take 1 tablet by mouth daily.     [provider]  pantoprazole (PROTONIX) 20 MG tablet Take 1 tablet (20 mg total) by mouth daily. 06/21/18   Strader, Fransisco Hertz, PA-C  potassium chloride SA (K-DUR) 20 MEQ tablet Take 20 mEq by mouth daily.  05/23/18   [provider]  psyllium (METAMUCIL) 58.6 % packet Take 1 packet by mouth daily.    [provider]  rosuvastatin (CRESTOR) 20 MG tablet Take 20 mg by mouth daily.    [provider]  Simethicone (GAS-X PO) Take 1 tablet by mouth in the morning and at bedtime.     [provider]  topiramate (TOPAMAX) 100 MG tablet TAKE ONE TABLET BY MOUTH AT BEDTIME 10/19/21   Genia Harold, MD  Ubrogepant (UBRELVY) 50 MG TABS Take 50 mg by mouth as needed. Can repeat a dose in 2 hours if headache persists 02/11/21   Genia Harold, MD  Vitamin D, Ergocalciferol, (DRISDOL) 1.25 MG (50000 UNIT) CAPS capsule Take 50,000 Units by mouth every 30 (thirty) days.    [provider]      Allergies    Fish allergy, Iodinated contrast media, Iodine, Lentil, Penicillins, Lipitor [atorvastatin], Spironolactone, and Sulfa antibiotics    Review of Systems   Review of Systems  Constitutional:  Positive for fatigue and fever.  HENT:  Negative for sore throat.   Eyes:  Negative for visual disturbance.  Respiratory:  Positive for cough and shortness of breath.   Cardiovascular:  Negative for chest pain.  Gastrointestinal:  Positive for  abdominal pain, constipation, flatus, nausea and vomiting. Negative for diarrhea, hematemesis and hematochezia.  Genitourinary:  Negative for dysuria and hematuria.    Physical Exam Updated Vital Signs BP 132/62   Pulse 97   Temp 99.4 F (37.4 C) (Oral)   Resp 18   Ht '5\' 7"'$  (1.702 m)   Wt 91.6 kg   SpO2 99%   BMI 31.64 kg/m  Physical Exam Vitals and nursing note reviewed.  Constitutional:      General: She is not in acute distress.    Appearance: Normal appearance. She is well-developed.  HENT:     Head: Normocephalic and atraumatic.  Eyes:     Conjunctiva/sclera: Conjunctivae normal.  Cardiovascular:     Rate and Rhythm: Normal rate and regular rhythm.  Heart sounds: No murmur heard. Pulmonary:     Effort: Pulmonary effort is normal. No respiratory distress.     Breath sounds: Normal breath sounds.  Abdominal:     Palpations: Abdomen is soft.     Tenderness: There is abdominal tenderness. There is no guarding or rebound.  Musculoskeletal:        General: No deformity. Normal range of motion.     Cervical back: Neck supple.  Skin:    General: Skin is warm and dry.     Capillary Refill: Capillary refill takes less than 2 seconds.  Neurological:     General: No focal deficit present.     Mental Status: She is alert.     ED Results / Procedures / Treatments   Labs (all labs ordered are listed, but only abnormal results are displayed) Labs Reviewed  CBC WITH DIFFERENTIAL/PLATELET - Abnormal; Notable for the following components:      Result Value   WBC 14.9 (*)    Neutro Abs 12.6 (*)    Monocytes Absolute 1.4 (*)    Abs Immature Granulocytes 0.14 (*)    All other components within normal limits  COMPREHENSIVE METABOLIC PANEL - Abnormal; Notable for the following components:   CO2 20 (*)    Glucose, Bld 139 (*)    BUN 29 (*)    Creatinine, Ser 1.24 (*)    Albumin 3.3 (*)    GFR, Estimated 50 (*)    All other components within normal limits  RESP PANEL BY  RT-PCR (RSV, FLU A&B, COVID)  RVPGX2  LIPASE, BLOOD  URINALYSIS, ROUTINE W REFLEX MICROSCOPIC    EKG None  Radiology DG Abdomen Acute W/Chest  Result Date: 02/28/2022 CLINICAL DATA:  cough, constipation EXAM: DG ABDOMEN ACUTE WITH 1 VIEW CHEST COMPARISON:  November 10, 2020 FINDINGS: Incomplete assessment of the lateral soft tissues. Evaluation is limited secondary to underpenetration. Air and stool filled nondilated loops of bowel throughout the abdomen. No definitive free air noted. AICD cardiac loop recorder. No pleural effusion or pneumothorax. LEFT basilar scarring/atelectasis. Atherosclerotic calcifications of the aorta. There is a calcific density projecting just lateral to the left L4 transverse process which measures 12 mm. IMPRESSION: 1. Nonobstructive bowel gas pattern. 2. 12 mm calcific density projecting just lateral to the left L4 transverse process. This could reflect a ureteral stone versus enteric debris. Recommend correlation with point tenderness. If concern for obstructing nephrolithiasis, recommend dedicated CT abdomen pelvis without contrast. 3. No acute cardiopulmonary abnormality. Electronically Signed   By: Valentino Saxon M.D.   On: 02/28/2022 17:48    Procedures Procedures  {Document cardiac monitor, telemetry assessment procedure when appropriate:1}  Medications Ordered in ED Medications - No data to display  ED Course/ Medical Decision Making/ A&P                           Medical Decision Making Amount and/or Complexity of Data Reviewed Labs: ordered. Radiology: ordered.   This patient complains of ***; this involves an extensive number of treatment Options and is a complaint that carries with it a high risk of complications and morbidity. The differential includes ***  I ordered, reviewed and interpreted labs, which included *** I ordered medication *** and reviewed PMP when indicated. I ordered imaging studies which included *** and I  independently    visualized and interpreted imaging which showed *** Additional history obtained from *** Previous records obtained and reviewed *** I consulted ***  and discussed lab and imaging findings and discussed disposition.  Cardiac monitoring reviewed, *** Social determinants considered, *** Critical Interventions: ***  After the interventions stated above, I reevaluated the patient and found *** Admission and further testing considered, ***   {Document critical care time when appropriate:1} {Document review of labs and clinical decision tools ie heart score, Chads2Vasc2 etc:1}  {Document your independent review of radiology images, and any outside records:1} {Document your discussion with family members, caretakers, and with consultants:1} {Document social determinants of health affecting pt's care:1} {Document your decision making why or why not admission, treatments were needed:1} Final Clinical Impression(s) / ED Diagnoses Final diagnoses:  None    Rx / DC Orders ED Discharge Orders     None

## 2022-02-28 NOTE — ED Triage Notes (Signed)
Pt with URI symptoms including cough, fever, malaise for four days. Pt also c/o burning with urination, denies discharge, vaginal bleeding.

## 2022-02-28 NOTE — ED Provider Triage Note (Signed)
Emergency Medicine Provider Triage Evaluation Note  [name] , a 61 y.o. female  was evaluated in triage.  Pt complains of subjective fever, malaise, cough, burning with urination and constipation with gas.  Symptoms present x 4 days.  She notes no bowel movement in 4 days but feels urge to defecate.  Passes gas instead.  Describes abdominal bloating and discomfort.  Symptoms associated with nausea without vomiting.  Other family members exposed to [place].  States she is concerned she may has picked up something from her granddaughter.  Describes her cough as productive of yellow sputum.  She denies any chest pain or shortness of breath.  No peripheral edema.   Review of Systems  Positive: Malaise, cough, fever, burning with urination and constipation Negative: Vomiting, abdominal pain, chest pain, shortness of breath  Physical Exam  BP 118/61 (BP Location: Right Arm)   Pulse 100   Temp 99.4 F (37.4 C) (Oral)   Resp 18   Ht '5\' 7"'$  (1.702 m)   Wt 91.6 kg   SpO2 97%   BMI 31.64 kg/m  Gen:   Awake, no distress   Resp:  Normal effort  MSK:   Moves extremities without difficulty  Other:  No erythema or edema of the oropharynx, abdomen soft nontender  Medical Decision Making  Medically screening exam initiated at 5:14 PM.  Appropriate orders placed.  [place] was informed that the remainder of the evaluation will be completed by another provider, this initial triage assessment does not replace that evaluation, and the importance of remaining in the ED until their evaluation is complete.     Kem Parkinson, PA-C 02/28/22 1716

## 2022-03-01 ENCOUNTER — Other Ambulatory Visit: Payer: Self-pay

## 2022-03-01 ENCOUNTER — Encounter (HOSPITAL_COMMUNITY): Payer: Self-pay | Admitting: Internal Medicine

## 2022-03-01 ENCOUNTER — Observation Stay (HOSPITAL_BASED_OUTPATIENT_CLINIC_OR_DEPARTMENT_OTHER): Payer: Medicare Other | Admitting: Certified Registered"

## 2022-03-01 ENCOUNTER — Observation Stay (HOSPITAL_COMMUNITY): Payer: Medicare Other | Admitting: Certified Registered"

## 2022-03-01 ENCOUNTER — Observation Stay (HOSPITAL_COMMUNITY): Payer: Medicare Other

## 2022-03-01 ENCOUNTER — Encounter (HOSPITAL_COMMUNITY)
Admission: EM | Disposition: A | Discharge status: Home / Self Care | Payer: Self-pay | Source: Home / Self Care | Attending: Emergency Medicine

## 2022-03-01 DIAGNOSIS — D72829 Elevated white blood cell count, unspecified: Secondary | ICD-10-CM | POA: Diagnosis not present

## 2022-03-01 DIAGNOSIS — N200 Calculus of kidney: Secondary | ICD-10-CM

## 2022-03-01 DIAGNOSIS — F17210 Nicotine dependence, cigarettes, uncomplicated: Secondary | ICD-10-CM

## 2022-03-01 DIAGNOSIS — I5042 Chronic combined systolic (congestive) and diastolic (congestive) heart failure: Secondary | ICD-10-CM

## 2022-03-01 DIAGNOSIS — E038 Other specified hypothyroidism: Secondary | ICD-10-CM | POA: Diagnosis not present

## 2022-03-01 DIAGNOSIS — N201 Calculus of ureter: Secondary | ICD-10-CM

## 2022-03-01 DIAGNOSIS — N1330 Unspecified hydronephrosis: Secondary | ICD-10-CM

## 2022-03-01 DIAGNOSIS — N390 Urinary tract infection, site not specified: Secondary | ICD-10-CM | POA: Diagnosis not present

## 2022-03-01 DIAGNOSIS — N135 Crossing vessel and stricture of ureter without hydronephrosis: Secondary | ICD-10-CM

## 2022-03-01 DIAGNOSIS — N179 Acute kidney failure, unspecified: Secondary | ICD-10-CM | POA: Diagnosis present

## 2022-03-01 DIAGNOSIS — N211 Calculus in urethra: Secondary | ICD-10-CM | POA: Diagnosis not present

## 2022-03-01 DIAGNOSIS — R109 Unspecified abdominal pain: Secondary | ICD-10-CM | POA: Diagnosis present

## 2022-03-01 DIAGNOSIS — I110 Hypertensive heart disease with heart failure: Secondary | ICD-10-CM

## 2022-03-01 HISTORY — PX: CYSTOSCOPY W/ URETERAL STENT PLACEMENT: SHX1429

## 2022-03-01 LAB — BASIC METABOLIC PANEL
Anion gap: 9 (ref 5–15)
BUN: 30 mg/dL — ABNORMAL HIGH (ref 6–20)
CO2: 21 mmol/L — ABNORMAL LOW (ref 22–32)
Calcium: 8.4 mg/dL — ABNORMAL LOW (ref 8.9–10.3)
Chloride: 108 mmol/L (ref 98–111)
Creatinine, Ser: 1.31 mg/dL — ABNORMAL HIGH (ref 0.44–1.00)
GFR, Estimated: 47 mL/min — ABNORMAL LOW (ref >60)
Glucose, Bld: 121 mg/dL — ABNORMAL HIGH (ref 70–99)
Potassium: 3.5 mmol/L (ref 3.5–5.1)
Sodium: 138 mmol/L (ref 135–145)

## 2022-03-01 LAB — CBC
HCT: 36.5 % (ref 36.0–46.0)
Hemoglobin: 12 g/dL (ref 12.0–15.0)
MCH: 30.7 pg (ref 26.0–34.0)
MCHC: 32.9 g/dL (ref 30.0–36.0)
MCV: 93.4 fL (ref 80.0–100.0)
Platelets: 177 10*3/uL (ref 150–400)
RBC: 3.91 MIL/uL (ref 3.87–5.11)
RDW: 13.7 % (ref 11.5–15.5)
WBC: 14.5 10*3/uL — ABNORMAL HIGH (ref 4.0–10.5)
nRBC: 0 % (ref 0.0–0.2)

## 2022-03-01 LAB — HIV ANTIBODY (ROUTINE TESTING W REFLEX): HIV Screen 4th Generation wRfx: NONREACTIVE

## 2022-03-01 LAB — CBG MONITORING, ED: Glucose-Capillary: 109 mg/dL — ABNORMAL HIGH (ref 70–99)

## 2022-03-01 SURGERY — CYSTOSCOPY, WITH RETROGRADE PYELOGRAM AND URETERAL STENT INSERTION
Anesthesia: General | Site: Urethra | Laterality: Bilateral

## 2022-03-01 MED ORDER — LEVOFLOXACIN IN D5W 500 MG/100ML IV SOLN
500.0000 mg | INTRAVENOUS | Status: DC
  Administered 2022-03-01: 500 mg via INTRAVENOUS
  Filled 2022-03-01: qty 100

## 2022-03-01 MED ORDER — LORATADINE 10 MG PO TABS
10.0000 mg | ORAL_TABLET | Freq: Every day | ORAL | Status: DC
  Administered 2022-03-02: 10 mg via ORAL
  Filled 2022-03-01: qty 1

## 2022-03-01 MED ORDER — ROSUVASTATIN CALCIUM 20 MG PO TABS
20.0000 mg | ORAL_TABLET | Freq: Every day | ORAL | Status: DC
  Administered 2022-03-02: 20 mg via ORAL
  Filled 2022-03-01: qty 1

## 2022-03-01 MED ORDER — PHENYLEPHRINE HCL-NACL 20-0.9 MG/250ML-% IV SOLN
INTRAVENOUS | Status: DC | PRN
  Administered 2022-03-01: 100 ug/min via INTRAVENOUS

## 2022-03-01 MED ORDER — CARVEDILOL 12.5 MG PO TABS
25.0000 mg | ORAL_TABLET | Freq: Two times a day (BID) | ORAL | Status: DC
  Filled 2022-03-01 (×2): qty 2

## 2022-03-01 MED ORDER — FENTANYL CITRATE (PF) 100 MCG/2ML IJ SOLN
INTRAMUSCULAR | Status: AC
  Filled 2022-03-01: qty 2

## 2022-03-01 MED ORDER — ONDANSETRON HCL 4 MG/2ML IJ SOLN
INTRAMUSCULAR | Status: AC
  Filled 2022-03-01: qty 2

## 2022-03-01 MED ORDER — DEXAMETHASONE SODIUM PHOSPHATE 10 MG/ML IJ SOLN: INTRAMUSCULAR | Status: DC | PRN

## 2022-03-01 MED ORDER — HYDRALAZINE HCL 20 MG/ML IJ SOLN: 5.0000 mg | Freq: Four times a day (QID) | INTRAMUSCULAR | Status: DC | PRN

## 2022-03-01 MED ORDER — DEXAMETHASONE SODIUM PHOSPHATE 10 MG/ML IJ SOLN
INTRAMUSCULAR | Status: DC | PRN
  Administered 2022-03-01: 10 mg via INTRAVENOUS

## 2022-03-01 MED ORDER — GLYCOPYRROLATE PF 0.2 MG/ML IJ SOSY
PREFILLED_SYRINGE | INTRAMUSCULAR | Status: AC
  Filled 2022-03-01: qty 1

## 2022-03-01 MED ORDER — LIDOCAINE HCL (PF) 2 % IJ SOLN
INTRAMUSCULAR | Status: DC | PRN
  Administered 2022-03-01: 50 mg via INTRADERMAL

## 2022-03-01 MED ORDER — GLYCOPYRROLATE PF 0.2 MG/ML IJ SOSY
PREFILLED_SYRINGE | INTRAMUSCULAR | Status: DC | PRN
  Administered 2022-03-01: .2 mg via INTRAVENOUS

## 2022-03-01 MED ORDER — MIDAZOLAM HCL 2 MG/2ML IJ SOLN
INTRAMUSCULAR | Status: AC
  Filled 2022-03-01: qty 2

## 2022-03-01 MED ORDER — ACETAMINOPHEN 325 MG PO TABS
650.0000 mg | ORAL_TABLET | Freq: Four times a day (QID) | ORAL | Status: DC | PRN
  Administered 2022-03-01: 650 mg via ORAL
  Filled 2022-03-01: qty 2

## 2022-03-01 MED ORDER — FENTANYL CITRATE PF 50 MCG/ML IJ SOSY: 25.0000 ug | PREFILLED_SYRINGE | INTRAMUSCULAR | Status: DC | PRN

## 2022-03-01 MED ORDER — LEVOTHYROXINE SODIUM 100 MCG PO TABS
200.0000 ug | ORAL_TABLET | Freq: Every day | ORAL | Status: DC
  Administered 2022-03-02: 200 ug via ORAL
  Filled 2022-03-01: qty 2

## 2022-03-01 MED ORDER — SODIUM CHLORIDE 0.9 % IR SOLN
Status: DC | PRN
  Administered 2022-03-01: 3000 mL

## 2022-03-01 MED ORDER — MORPHINE SULFATE (PF) 2 MG/ML IV SOLN: 1.0000 mg | INTRAVENOUS | Status: DC | PRN

## 2022-03-01 MED ORDER — ORAL CARE MOUTH RINSE: 15.0000 mL | Freq: Once | OROMUCOSAL | Status: AC

## 2022-03-01 MED ORDER — DIATRIZOATE MEGLUMINE 30 % UR SOLN
URETHRAL | Status: DC | PRN
  Administered 2022-03-01: 20 mL via URETHRAL

## 2022-03-01 MED ORDER — BUDESONIDE-FORMOTEROL FUMARATE 80-4.5 MCG/ACT IN AERO
2.0000 | INHALATION_SPRAY | Freq: Two times a day (BID) | RESPIRATORY_TRACT | Status: DC
  Administered 2022-03-01 – 2022-03-02 (×3): 2 via RESPIRATORY_TRACT

## 2022-03-01 MED ORDER — ONDANSETRON HCL 4 MG/2ML IJ SOLN
INTRAMUSCULAR | Status: DC | PRN
  Administered 2022-03-01: 4 mg via INTRAVENOUS

## 2022-03-01 MED ORDER — PHENYLEPHRINE 80 MCG/ML (10ML) SYRINGE FOR IV PUSH (FOR BLOOD PRESSURE SUPPORT)
PREFILLED_SYRINGE | INTRAVENOUS | Status: AC
  Filled 2022-03-01: qty 10

## 2022-03-01 MED ORDER — LIDOCAINE HCL (PF) 2 % IJ SOLN
INTRAMUSCULAR | Status: AC
  Filled 2022-03-01: qty 5

## 2022-03-01 MED ORDER — ESMOLOL HCL 100 MG/10ML IV SOLN
INTRAVENOUS | Status: DC | PRN
  Administered 2022-03-01: 20 mg via INTRAVENOUS

## 2022-03-01 MED ORDER — ACETAMINOPHEN 650 MG RE SUPP: 650.0000 mg | Freq: Four times a day (QID) | RECTAL | Status: DC | PRN

## 2022-03-01 MED ORDER — ALBUTEROL SULFATE (2.5 MG/3ML) 0.083% IN NEBU: 2.5000 mg | INHALATION_SOLUTION | Freq: Four times a day (QID) | RESPIRATORY_TRACT | Status: DC | PRN

## 2022-03-01 MED ORDER — STERILE WATER FOR IRRIGATION IR SOLN
Status: DC | PRN
  Administered 2022-03-01: 500 mL

## 2022-03-01 MED ORDER — LACTATED RINGERS IV SOLN: INTRAVENOUS | Status: DC

## 2022-03-01 MED ORDER — FENTANYL CITRATE (PF) 100 MCG/2ML IJ SOLN
INTRAMUSCULAR | Status: DC | PRN
  Administered 2022-03-01: 50 ug via INTRAVENOUS
  Administered 2022-03-01: 25 ug via INTRAVENOUS
  Administered 2022-03-01 (×2): 50 ug via INTRAVENOUS
  Administered 2022-03-01: 25 ug via INTRAVENOUS

## 2022-03-01 MED ORDER — DIATRIZOATE MEGLUMINE 30 % UR SOLN
URETHRAL | Status: AC
  Filled 2022-03-01: qty 100

## 2022-03-01 MED ORDER — CHLORHEXIDINE GLUCONATE 0.12 % MT SOLN
15.0000 mL | Freq: Once | OROMUCOSAL | Status: AC
  Administered 2022-03-01: 15 mL via OROMUCOSAL

## 2022-03-01 MED ORDER — MOMETASONE FURO-FORMOTEROL FUM 100-5 MCG/ACT IN AERO
2.0000 | INHALATION_SPRAY | Freq: Two times a day (BID) | RESPIRATORY_TRACT | Status: DC
  Filled 2022-03-01: qty 8.8

## 2022-03-01 MED ORDER — PROPOFOL 10 MG/ML IV BOLUS
INTRAVENOUS | Status: AC
  Filled 2022-03-01: qty 20

## 2022-03-01 MED ORDER — PROPOFOL 10 MG/ML IV BOLUS
INTRAVENOUS | Status: DC | PRN
  Administered 2022-03-01: 50 mg via INTRAVENOUS

## 2022-03-01 MED ORDER — LACTATED RINGERS IV SOLN: INTRAVENOUS | Status: AC

## 2022-03-01 MED ORDER — MIDAZOLAM HCL 5 MG/5ML IJ SOLN
INTRAMUSCULAR | Status: DC | PRN
  Administered 2022-03-01: 2 mg via INTRAVENOUS

## 2022-03-01 MED ORDER — PANTOPRAZOLE SODIUM 40 MG PO TBEC
40.0000 mg | DELAYED_RELEASE_TABLET | Freq: Every day | ORAL | Status: DC
  Administered 2022-03-02: 40 mg via ORAL
  Filled 2022-03-01: qty 1

## 2022-03-01 MED ORDER — OXYCODONE HCL 5 MG PO TABS: 5.0000 mg | ORAL_TABLET | Freq: Once | ORAL | Status: DC | PRN

## 2022-03-01 MED ORDER — PHENYLEPHRINE HCL (PRESSORS) 10 MG/ML IV SOLN
INTRAVENOUS | Status: DC | PRN
  Administered 2022-03-01 (×2): 320 ug via INTRAVENOUS
  Administered 2022-03-01: 180 ug via INTRAVENOUS
  Administered 2022-03-01: 160 ug via INTRAVENOUS

## 2022-03-01 MED ORDER — CHLORHEXIDINE GLUCONATE 0.12 % MT SOLN
OROMUCOSAL | Status: AC
  Administered 2022-03-01: 15 mL
  Filled 2022-03-01: qty 15

## 2022-03-01 MED ORDER — OXYCODONE HCL 5 MG/5ML PO SOLN: 5.0000 mg | Freq: Once | ORAL | Status: DC | PRN

## 2022-03-01 MED ORDER — HYDRALAZINE HCL 20 MG/ML IJ SOLN: 10.0000 mg | Freq: Four times a day (QID) | INTRAMUSCULAR | Status: DC | PRN

## 2022-03-01 MED ORDER — SACUBITRIL-VALSARTAN 24-26 MG PO TABS
1.0000 | ORAL_TABLET | Freq: Two times a day (BID) | ORAL | Status: DC
  Administered 2022-03-01 – 2022-03-02 (×2): 1 via ORAL
  Filled 2022-03-01 (×2): qty 1

## 2022-03-01 MED ORDER — ONDANSETRON HCL 4 MG/2ML IJ SOLN: 4.0000 mg | Freq: Once | INTRAMUSCULAR | Status: DC | PRN

## 2022-03-01 MED ORDER — DEXAMETHASONE SODIUM PHOSPHATE 10 MG/ML IJ SOLN
INTRAMUSCULAR | Status: AC
  Filled 2022-03-01: qty 1

## 2022-03-01 MED ORDER — ALBUTEROL SULFATE HFA 108 (90 BASE) MCG/ACT IN AERS: 2.0000 | INHALATION_SPRAY | Freq: Four times a day (QID) | RESPIRATORY_TRACT | Status: DC | PRN

## 2022-03-01 MED ORDER — ENOXAPARIN SODIUM 40 MG/0.4ML IJ SOSY
40.0000 mg | PREFILLED_SYRINGE | INTRAMUSCULAR | Status: DC
  Administered 2022-03-02: 40 mg via SUBCUTANEOUS
  Filled 2022-03-01: qty 0.4

## 2022-03-01 MED ORDER — ASPIRIN 81 MG PO TBEC
81.0000 mg | DELAYED_RELEASE_TABLET | Freq: Every day | ORAL | Status: DC
  Administered 2022-03-02: 81 mg via ORAL
  Filled 2022-03-01: qty 1

## 2022-03-01 MED ORDER — FUROSEMIDE 40 MG PO TABS
40.0000 mg | ORAL_TABLET | Freq: Two times a day (BID) | ORAL | Status: DC
  Administered 2022-03-01 – 2022-03-02 (×2): 40 mg via ORAL
  Filled 2022-03-01 (×2): qty 1

## 2022-03-01 MED ORDER — LEVOTHYROXINE SODIUM 50 MCG PO TABS: 200.0000 ug | ORAL_TABLET | Freq: Every day | ORAL | Status: DC

## 2022-03-01 MED ORDER — ONDANSETRON HCL 4 MG/2ML IJ SOLN: 4.0000 mg | Freq: Four times a day (QID) | INTRAMUSCULAR | Status: DC | PRN

## 2022-03-01 MED ORDER — PANTOPRAZOLE SODIUM 20 MG PO TBEC
20.0000 mg | DELAYED_RELEASE_TABLET | Freq: Every day | ORAL | Status: DC
  Filled 2022-03-01 (×2): qty 1

## 2022-03-01 SURGICAL SUPPLY — 22 items
BAG DRAIN URO TABLE W/ADPT NS (BAG) ×1 IMPLANT
BAG DRN 8 ADPR NS SKTRN CSTL (BAG) ×1
BAG HAMPER (MISCELLANEOUS) ×1 IMPLANT
CATH INTERMIT  6FR 70CM (CATHETERS) ×1 IMPLANT
CLOTH BEACON ORANGE TIMEOUT ST (SAFETY) ×1 IMPLANT
DECANTER SPIKE VIAL GLASS SM (MISCELLANEOUS) ×1 IMPLANT
GLOVE BIO SURGEON STRL SZ8 (GLOVE) ×1 IMPLANT
GLOVE BIOGEL PI IND STRL 7.0 (GLOVE) ×2 IMPLANT
GLOVE SURG SS PI 7.0 STRL IVOR (GLOVE) IMPLANT
GOWN STRL REUS W/TWL LRG LVL3 (GOWN DISPOSABLE) ×1 IMPLANT
GOWN STRL REUS W/TWL XL LVL3 (GOWN DISPOSABLE) ×1 IMPLANT
GUIDEWIRE STR ZIPWIRE 035X150 (MISCELLANEOUS) ×1 IMPLANT
IV NS IRRIG 3000ML ARTHROMATIC (IV SOLUTION) ×1 IMPLANT
KIT TURNOVER CYSTO (KITS) ×1 IMPLANT
MANIFOLD NEPTUNE II (INSTRUMENTS) ×1 IMPLANT
PACK CYSTO (CUSTOM PROCEDURE TRAY) ×1 IMPLANT
PAD ARMBOARD 7.5X6 YLW CONV (MISCELLANEOUS) ×1 IMPLANT
STENT URET 6FRX26 CONTOUR (STENTS) IMPLANT
SYR 10ML LL (SYRINGE) ×1 IMPLANT
TOWEL OR 17X26 4PK STRL BLUE (TOWEL DISPOSABLE) ×1 IMPLANT
TRAY FOLEY MTR SLVR 16FR STAT (SET/KITS/TRAYS/PACK) IMPLANT
WATER STERILE IRR 500ML POUR (IV SOLUTION) ×1 IMPLANT

## 2022-03-01 NOTE — ED Notes (Signed)
Patient transported to OR via wheel chair

## 2022-03-01 NOTE — Progress Notes (Signed)
   03/01/22 1644  Assess: MEWS Score  Temp 98.6 F (37 C)  BP (!) 96/57  MAP (mmHg) 68  Pulse Rate 89  Resp (!) 24  Level of Consciousness Alert  SpO2 95 %  O2 Device Room Air  Assess: MEWS Score  MEWS Temp 0  MEWS Systolic 1  MEWS Pulse 0  MEWS RR 1  MEWS LOC 0  MEWS Score 2  MEWS Score Color Yellow  Assess: if the MEWS score is Yellow or Red  Were vital signs taken at a resting state? Yes  Focused Assessment No change from prior assessment  Does the patient meet 2 or more of the SIRS criteria? No  MEWS guidelines implemented *See Row Information* No, previously yellow, continue vital signs every 4 hours  Take Vital Signs  Increase Vital Sign Frequency  Yellow: Q 2hr X 2 then Q 4hr X 2, if remains yellow, continue Q 4hrs  Notify: Charge Nurse/RN  Name of Charge Nurse/RN Notified [place]  Date Charge Nurse/RN Notified 03/01/22  Time Charge Nurse/RN Notified 1657  Provider Notification  Provider Name/Title Maurene Capes  Date Provider Notified 03/01/22  Time Provider Notified 1657  Method of Notification Page  Notification Reason Other (Comment) (resp)  Provider response No new orders  Date of Provider Response 03/01/22  Time of Provider Response 1658  Document  Patient Outcome Other (Comment)  Progress note created (see row info) Yes  Assess: SIRS CRITERIA  SIRS Temperature  0  SIRS Pulse 0  SIRS Respirations  1  SIRS WBC 0  SIRS Score Sum  1

## 2022-03-01 NOTE — ED Notes (Signed)
Patient ambulatory to restroom  ?

## 2022-03-01 NOTE — Progress Notes (Signed)
  Transition of Care [facility]) Screening Note   Patient Details  Name: Melanie Cordova Date of Birth: 1960-07-26   Transition of Care [facility]) CM/SW Contact:    Ihor Gully, LCSW Phone Number: 03/01/2022, 12:47 PM    Transition of Care Department [facility]) has reviewed patient and no TOC needs have been identified at this time. We will continue to monitor patient advancement through interdisciplinary progression rounds. If new patient transition needs arise, please place a TOC consult.

## 2022-03-01 NOTE — Progress Notes (Signed)
   03/01/22 1530  Assess: MEWS Score  Temp 98.2 F (36.8 C)  BP 100/62  MAP (mmHg) 73  Pulse Rate (!) 101  Resp (!) 22  SpO2 98 %  O2 Device Room Air  Assess: MEWS Score  MEWS Temp 0  MEWS Systolic 1  MEWS Pulse 1  MEWS RR 1  MEWS LOC 0  MEWS Score 3  MEWS Score Color Yellow  Assess: if the MEWS score is Yellow or Red  Were vital signs taken at a resting state? Yes  Focused Assessment No change from prior assessment  Does the patient meet 2 or more of the SIRS criteria? No  MEWS guidelines implemented *See Row Information* No, previously yellow, continue vital signs every 4 hours  Take Vital Signs  Increase Vital Sign Frequency  Yellow: Q 2hr X 2 then Q 4hr X 2, if remains yellow, continue Q 4hrs  Notify: Charge Nurse/RN  Name of Charge Nurse/RN Notified [place]  Date Charge Nurse/RN Notified 03/01/22  Time Charge Nurse/RN Notified 1655  Document  Patient Outcome Other (Comment) (will continue to check resp)  Progress note created (see row info) Yes  Assess: SIRS CRITERIA  SIRS Temperature  0  SIRS Pulse 1  SIRS Respirations  1  SIRS WBC 1  SIRS Score Sum  3

## 2022-03-01 NOTE — Progress Notes (Signed)
From [place] earlier.  Alert and oriented.  Vitals stable except resp 24.  BP slightly low just now 96/57 and will hold scheduled coreg.  States smokes half pack daily.  Foley in places and draining clear yellow urine.  Has home meds at bedside and brother to come and pick them up.

## 2022-03-01 NOTE — H&P (Signed)
History and Physical    Patient: Melanie Cordova HKV:[id number] DOB: 15-May-1960 DOA: 02/28/2022 DOS: the patient was seen and examined on 03/01/2022 PCP: Carrolyn Meiers, MD  Patient coming from: Home  Chief Complaint:  flank pain  Chief Complaint  Patient presents with   URI   HPI: Melanie Cordova is a 61 y.o. female with medical history significant of hypothyroidism, hypertension, hyperlipidemia, Chronic CHF (systolic), Hx of CVA, seizure do, Gerd, Tobacco dep, Copd, apparently presents with c/o bilateral flank pain x 4 days, along with bilateral inguinal discomfort and suprapubic discomfort, and dysuria, and subjective fever x 4days.   Pt denies cough, cp, palp, sob, diarrhea, brbpr, black stool, gross hematuria.   Review of Systems: negative for all 10 organ systems except for + above   Past Medical History:  Diagnosis Date   Anxiety    Arthritis    Asthma    Automatic implantable cardioverter-defibrillator in situ    2013, july   Bell palsy    states has had 3 episodes   Cardiomyopathy 08/2010   Presented with congestive heart failure; EF of 15% and 2012; hypotension on medication precludes optimal dosing   CHF (congestive heart failure) ([place])    a. EF 20% in 2012 with low-risk NST --> St. Jude ICD implantation by Dr. Lovena Le in 09/2011 b. NST in 12/2017 showing evidence of prior infarct without ischemia c. EF at 45-50% by echo in 56/4332   Chronic systolic heart failure ([place])    COPD (chronic obstructive pulmonary disease) ([place])    2013   Dysrhythmia    Gastroesophageal reflux disease    Headache(784.0)    Hot flashes 10/04/2016   Hyperlipidemia    Hypertension    09/2010-normal CMet and CBC; Lipid profile-116, 88, 25, 73   Hypothyroidism    Recent TSH was normal.   ICD (implantable cardiac defibrillator) in place 10/10/2011   Neuromuscular disorder (HCC)    Neuropathy, right foot and leg   Neuropathy    Obesity    Pneumonia    Seizures ([place])     last one at age 29   Shortness of breath    Stroke ([place])     stroke in 07/2012 and another in June, 2014   Tobacco abuse    20 pack years   Past Surgical History:  Procedure Laterality Date   CESAREAN SECTION     X2   COLONOSCOPY N/A 12/16/2013   One simple adenoma and 3 hyperplastic polyps. Small internal hemorrhoids. Left colon redundant. Due for surveillance 2020.    COLONOSCOPY WITH PROPOFOL N/A 06/04/2019   Procedure: COLONOSCOPY WITH PROPOFOL;  Surgeon: Danie Binder, MD;  [facility];  Service: Endoscopy;  Laterality: N/A;  10:30am   EP IMPLANTABLE DEVICE     St. Jude   EP IMPLANTABLE DEVICE N/A 08/13/2015   Procedure: Loop Recorder Insertion;  Surgeon: Evans Lance, MD;  Location: [place] CV LAB;  Service: Cardiovascular;  Laterality: N/A;   IMPLANTABLE CARDIOVERTER DEFIBRILLATOR IMPLANT N/A 10/10/2011   Procedure: IMPLANTABLE CARDIOVERTER DEFIBRILLATOR IMPLANT;  Surgeon: Evans Lance, MD;  Location: [facility] CATH LAB;  Service: Cardiovascular;  Laterality: N/A;   MULTIPLE EXTRACTIONS WITH ALVEOLOPLASTY N/A 09/24/2012   Procedure: MULTIPLE EXTRACION #2, 4, 6, 7 ,8, 9, 11, 13, 18, 20, 21, 22, 23, 24, 25, 26, 27, 29 WITH ALVEOLOPLASTY, BIOPSY OF PALATE LESION, REMOVA RIGHT LINGUAL TORUS;  Surgeon: Gae Bon, DDS;  Location: [place];  Service: Oral Surgery;  Laterality: N/A;   POLYPECTOMY  06/04/2019   Procedure: POLYPECTOMY;  Surgeon: Danie Binder, MD;  [facility];  Service: Endoscopy;;   TEE WITHOUT CARDIOVERSION N/A 07/11/2012   Procedure: TRANSESOPHAGEAL ECHOCARDIOGRAM (TEE);  Surgeon: Thayer Headings, MD;  Location: [place];  Service: Cardiovascular;  Laterality: N/A;   TUBAL LIGATION     Social History:  reports that she has been smoking cigarettes. She started smoking about 38 years ago. She has a 15.75 pack-year smoking history. She has never used smokeless tobacco. She reports that she does not drink alcohol and does not use  drugs.  Allergies  Allergen Reactions   Fish Allergy Anaphylaxis   Iodinated Contrast Media Hives and Other (See Comments)    Pulmonary problems; no frank respiratory arrest   Iodine Hives and Other (See Comments)    Pulmonary Problems    Lentil Anaphylaxis   Penicillins Anaphylaxis and Shortness Of Breath    Has patient had a PCN reaction causing immediate rash, facial/tongue/throat swelling, SOB or lightheadedness with hypotension: Yes Has patient had a PCN reaction causing severe rash involving mucus membranes or skin necrosis: No Has patient had a PCN reaction that required hospitalization No Has patient had a PCN reaction occurring within the last 10 years: No If all of the above answers are "NO", then may proceed with Cephalosporin use.  Hair loss   Lipitor [Atorvastatin] Itching and Rash   Spironolactone Rash   Sulfa Antibiotics Other (See Comments)    Unknown    Family History  Problem Relation Age of Onset   Cardiomyopathy Mother        ICD pacemaker-ischmic CM   Heart failure Mother    Hypertension Mother    Diabetes Mother    Thyroid disease Mother    Cardiomyopathy Father        Deceased   Coronary artery disease Father    Heart failure Father    Heart failure Brother    Hypertension Brother    Endometriosis Sister    Heart disease Paternal Grandfather    Heart disease Paternal Grandmother    Heart disease Maternal Grandmother    Heart disease Maternal Grandfather    Hypertension Daughter    Obesity Daughter    Colon cancer Neg Hx    Liver disease Neg Hx     Prior to Admission medications   Medication Sig Start Date End Date Taking? Authorizing Provider  acetaminophen (TYLENOL) 650 MG CR tablet Take 650 mg by mouth every 8 (eight) hours as needed for pain.    [provider]  albuterol (PROVENTIL HFA;VENTOLIN HFA) 108 (90 BASE) MCG/ACT inhaler Inhale 2 puffs into the lungs every 6 (six) hours as needed for wheezing or shortness of breath.  07/12/12   Trinda Pascal, MD  aspirin EC 81 MG tablet Take 1 tablet (81 mg total) by mouth daily. 07/03/19   Donzetta Starch, NP  azithromycin (ZITHROMAX) 250 MG tablet Take 1 tablet (250 mg total) by mouth daily. Take first 2 tablets together, then 1 every day until finished. 04/10/20   Henderly, Britni A, PA-C  benzonatate (TESSALON) 100 MG capsule Take 1 capsule (100 mg total) by mouth every 8 (eight) hours. 04/10/20   Henderly, Britni A, PA-C  Biotin 10000 MCG TABS Take 10,000 mcg by mouth daily.     [provider]  budesonide-formoterol (SYMBICORT) 80-4.5 MCG/ACT inhaler Inhale 2 puffs into the lungs 2 (two) times daily.    [provider]  carvedilol (COREG) 12.5 MG tablet Take 25 mg by mouth 2 (two) times daily with a meal.    [provider]  cetirizine (ZYRTEC) 10 MG tablet Take 10 mg by mouth daily.    [provider]  Cholecalciferol (VITAMIN D3) 5000 units CAPS TAKE ONE CAPSULE BY MOUTH DAILY 09/22/17   [name], MD  clobetasol (TEMOVATE) 0.05 % external solution Apply 1 application topically 2 (two) times daily as needed (Apply to scalp.).  07/24/15   [provider]  clopidogrel (PLAVIX) 75 MG tablet Take 1 tablet (75 mg total) by mouth daily. 07/03/19   Rosalin Hawking, MD  cyclobenzaprine (FLEXERIL) 5 MG tablet TAKE ONE TABLET BY MOUTH AT BEDTIME 04/05/21   Genia Harold, MD  diclofenac sodium (VOLTAREN) 1 % GEL Apply 2 g topically 4 (four) times daily as needed (pain).     [provider]  diphenhydrAMINE (BENADRYL) 25 MG tablet Take 25 mg by mouth in the morning and at bedtime.     [provider]  ENTRESTO 24-26 MG TAKE ONE TABLET BY MOUTH 2 TIMES A DAY Patient taking differently: Take 1 tablet by mouth 2 (two) times daily. 12/31/18   Evans Lance, MD  famotidine (PEPCID) 20 MG tablet Take 20 mg by mouth daily as needed for heartburn or indigestion.    [provider]  Ferrous Sulfate (IRON) 28 MG  TABS Take 28 mg by mouth daily.     [provider]  fluticasone (FLONASE) 50 MCG/ACT nasal spray Place 2 sprays into both nostrils daily.    [provider]  furosemide (LASIX) 40 MG tablet Take 1 tablet (40 mg total) by mouth 2 (two) times daily. 08/29/12   Yehuda Savannah, MD  ketoconazole (NIZORAL) 2 % cream Apply 1 application topically 2 (two) times daily.  10/21/15   [provider]  levothyroxine (SYNTHROID, LEVOTHROID) 200 MCG tablet Take 1 tablet (200 mcg total) by mouth daily before breakfast. 03/01/18   Nida, Marella Chimes, MD  LINZESS 145 MCG CAPS capsule Take 145 mcg by mouth daily. 02/12/20   [provider]  Multiple Vitamins-Minerals (CENTRAVITES 50 PLUS PO) Take 1 tablet by mouth daily.     [provider]  pantoprazole (PROTONIX) 20 MG tablet Take 1 tablet (20 mg total) by mouth daily. 06/21/18   Strader, Fransisco Hertz, PA-C  potassium chloride SA (K-DUR) 20 MEQ tablet Take 20 mEq by mouth daily.  05/23/18   [provider]  psyllium (METAMUCIL) 58.6 % packet Take 1 packet by mouth daily.    [provider]  rosuvastatin (CRESTOR) 20 MG tablet Take 20 mg by mouth daily.    [provider]  Simethicone (GAS-X PO) Take 1 tablet by mouth in the morning and at bedtime.     [provider]  topiramate (TOPAMAX) 100 MG tablet TAKE ONE TABLET BY MOUTH AT BEDTIME 10/19/21   Genia Harold, MD  Ubrogepant (UBRELVY) 50 MG TABS Take 50 mg by mouth as needed. Can repeat a dose in 2 hours if headache persists 02/11/21   Genia Harold, MD  Vitamin D, Ergocalciferol, (DRISDOL) 1.25 MG (50000 UNIT) CAPS capsule Take 50,000 Units by mouth every 30 (thirty) days.    [provider]    Physical Exam: Vitals:   02/28/22 2330 03/01/22 0000 03/01/22 0030 03/01/22 0100  BP: 113/60 (!) 119/57 (!) 118/55 (!) 109/55  Pulse:  (!) 108    Resp:  (!) 27    Temp:  TempSrc:      SpO2:  95%    Weight:      Height:        Heent: anicteric Neck: no jvd Heart: rrr s1, s2, 2/6 sem apex Lung: ctab Abd: soft, obese, nt, +bs,  + bil cva tenderness Ext: no c/c/e Skin : no rash Data Reviewed: Wbc 14.9, Hgb 13.9, Plt 180 Na 137, K 4.1, Bun 29, Creat 1.24 Alb 3.3 Ast 20, Alt 23  Urinalysis prot >300, wbc >50, rbc 11-20  FINDINGS: Lower chest: Lung bases are clear.   Hepatobiliary: Unenhanced liver is unremarkable.   Gallbladder is unremarkable. No intrahepatic or extrahepatic duct dilatation.   Pancreas: Within normal limits.   Spleen: Within normal limits.   Adrenals/Urinary Tract: Bilateral renal nodules, measuring 2.1 cm on the right and 3.8 cm on the left, both measuring less than 10 HUs and compatible with benign adrenal adenomas. No follow-up is recommended.   Right kidney is notable for a 1.9 cm calculus in the right renal pelvis with mild periureteral stranding and mild pelvic fullness. 2 nonobstructing left lower pole renal calculi measuring up to 5 mm (series 2/image 41). Associated 11 mm proximal left ureteral calculus at the L4 level (series 2/image 43), without hydronephrosis.   Bladder is within normal limits.   Stomach/Bowel: Stomach is notable for a tiny hiatal hernia.   No evidence of bowel obstruction.   Normal appendix (series 2/image 70).   No colonic wall thickening or inflammatory changes.   Vascular/Lymphatic: No evidence of abdominal aortic aneurysm.   Atherosclerotic calcifications of the abdominal aorta and branch vessels.   No suspicious abdominopelvic lymphadenopathy.   Reproductive: Uterus is within normal limits.   Bilateral ovaries are within normal limits.   Other: No abdominopelvic ascites.   Musculoskeletal: Visualized osseous structures are within normal limits.   IMPRESSION: 1.9 cm calculus in the right renal pelvis with mild pelvic fullness.   11 mm proximal left ureteral calculus at the L4 level, without hydronephrosis.   Two  nonobstructing left lower pole renal calculi measuring up to 5 mm.  Assessment and Plan: Acute lower UTI Urine culture pending Levaquin iv Urology consulted by ED, recommended transfer / admission to [facility] for possible nephrostomy tube by IR  Nephrolithiasis NPO Urology consulted by ED, appreciate input  Leukocytosis Check cbc in am  Sepsis (tachycardia, leukocytosis, AKI) Check lactic acid, procalcitonin, blood culture x2  AKI on CKD stage3 Hydrate with LR at 19m per hour x10 hrs,   DVT prophylaxis:   lovenox FULL CODE Dispo:  home    Advance Care Planning:   Code Status: Prior FULL CODE  Consults: urology   Family Communication: w patient  Severity of Illness: The appropriate patient status for this patient is OBSERVATION. Observation status is judged to be reasonable and necessary in order to provide the required intensity of service to ensure the patient's safety. The patient's presenting symptoms, physical exam findings, and initial radiographic and laboratory data in the context of their medical condition is felt to place them at decreased risk for further clinical deterioration. Furthermore, it is anticipated that the patient will be medically stable for discharge from the hospital within 2 midnights of admission.   Author: JJani Gravel MD 03/01/2022 1:28 AM  For on call review [email or web address]

## 2022-03-01 NOTE — Consult Note (Signed)
Urology Consult  Referring physician: Dr. Denton Brick Reason for referral: Bilateral UPJ calculi  Chief Complaint: abdominal pain  History of Present Illness: Melanie Cordova is a 61yo with multiple cormorbidities, on plavix who presented to the ER with a 5 days history of severe abdominal pain. This is her first stone event. The pain is sharp, intermittent, moderate to severe and nonraditing. She has nausea and vomiting. No fevers. 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 No fevers.   Past Medical History:  Diagnosis Date   Anxiety    Arthritis    Asthma    Automatic implantable cardioverter-defibrillator in situ    2013, july   Bell palsy    states has had 3 episodes   Cardiomyopathy 08/2010   Presented with congestive heart failure; EF of 15% and 2012; hypotension on medication precludes optimal dosing   CHF (congestive heart failure) ([place])    a. EF 20% in 2012 with low-risk NST --> St. Jude ICD implantation by Dr. Lovena Le in 09/2011 b. NST in 12/2017 showing evidence of prior infarct without ischemia c. EF at 45-50% by echo in 85/4627   Chronic systolic heart failure ([place])    COPD (chronic obstructive pulmonary disease) ([place])    2013   Dysrhythmia    Gastroesophageal reflux disease    Headache(784.0)    Hot flashes 10/04/2016   Hyperlipidemia    Hypertension    09/2010-normal CMet and CBC; Lipid profile-116, 88, 25, 73   Hypothyroidism    Recent TSH was normal.   ICD (implantable cardiac defibrillator) in place 10/10/2011   Neuromuscular disorder (HCC)    Neuropathy, right foot and leg   Neuropathy    Obesity    Pneumonia    Seizures ([place])    last one at age 43   Shortness of breath    Stroke ([place])     stroke in 07/2012 and another in June, 2014   Tobacco abuse    20 pack years   Past Surgical History:  Procedure Laterality Date   CESAREAN SECTION     X2   COLONOSCOPY N/A 12/16/2013   One simple adenoma and 3 hyperplastic polyps. Small internal  hemorrhoids. Left colon redundant. Due for surveillance 2020.    COLONOSCOPY WITH PROPOFOL N/A 06/04/2019   Procedure: COLONOSCOPY WITH PROPOFOL;  Surgeon: Danie Binder, MD;  [facility];  Service: Endoscopy;  Laterality: N/A;  10:30am   EP IMPLANTABLE DEVICE     St. Jude   EP IMPLANTABLE DEVICE N/A 08/13/2015   Procedure: Loop Recorder Insertion;  Surgeon: Evans Lance, MD;  Location: [facility];  Service: Cardiovascular;  Laterality: N/A;   IMPLANTABLE CARDIOVERTER DEFIBRILLATOR IMPLANT N/A 10/10/2011   Procedure: IMPLANTABLE CARDIOVERTER DEFIBRILLATOR IMPLANT;  Surgeon: Evans Lance, MD;  Location: [facility] LAB;  Service: Cardiovascular;  Laterality: N/A;   MULTIPLE EXTRACTIONS WITH ALVEOLOPLASTY N/A 09/24/2012   Procedure: MULTIPLE EXTRACION #2, 4, 6, 7 ,8, 9, 11, 13, 18, 20, 21, 22, 23, 24, 25, 26, 27, 29 WITH ALVEOLOPLASTY, BIOPSY OF PALATE LESION, REMOVA RIGHT LINGUAL TORUS;  Surgeon: Gae Bon, DDS;  Location: [facility];  Service: Oral Surgery;  Laterality: N/A;   POLYPECTOMY  06/04/2019   Procedure: POLYPECTOMY;  Surgeon: Danie Binder, MD;  [facility];  Service: Endoscopy;;   TEE WITHOUT CARDIOVERSION N/A 07/11/2012   Procedure: TRANSESOPHAGEAL ECHOCARDIOGRAM (TEE);  Surgeon: Thayer Headings, MD;  Location: [facility];  Service: Cardiovascular;  Laterality: N/A;   TUBAL LIGATION      Medications: I have reviewed the patient's current medications. Allergies:  Allergies  Allergen Reactions   Fish Allergy Anaphylaxis   Iodinated Contrast Media Hives and Other (See Comments)    Pulmonary problems; no frank respiratory arrest   Iodine Hives and Other (See Comments)    Pulmonary Problems    Lentil Anaphylaxis   Penicillins Anaphylaxis and Shortness Of Breath    Has patient had a PCN reaction causing immediate rash, facial/tongue/throat swelling, SOB or lightheadedness with hypotension: Yes Has patient had a PCN reaction causing severe rash  involving mucus membranes or skin necrosis: No Has patient had a PCN reaction that required hospitalization No Has patient had a PCN reaction occurring within the last 10 years: No If all of the above answers are "NO", then may proceed with Cephalosporin use.  Hair loss   Lipitor [Atorvastatin] Itching and Rash   Spironolactone Rash   Sulfa Antibiotics Other (See Comments)    Unknown    Family History  Problem Relation Age of Onset   Cardiomyopathy Mother        ICD pacemaker-ischmic CM   Heart failure Mother    Hypertension Mother    Diabetes Mother    Thyroid disease Mother    Cardiomyopathy Father        Deceased   Coronary artery disease Father    Heart failure Father    Heart failure Brother    Hypertension Brother    Endometriosis Sister    Heart disease Paternal Grandfather    Heart disease Paternal Grandmother    Heart disease Maternal Grandmother    Heart disease Maternal Grandfather    Hypertension Daughter    Obesity Daughter    Colon cancer Neg Hx    Liver disease Neg Hx    Social History:  reports that she has been smoking cigarettes. She started smoking about 38 years ago. She has a 15.75 pack-year smoking history. She has never used smokeless tobacco. She reports that she does not drink alcohol and does not use drugs.  Review of Systems  Constitutional:  Positive for diaphoresis.  Genitourinary:  Positive for difficulty urinating and flank pain.  All other systems reviewed and are negative.   Physical Exam:  Vital signs in last 24 hours: Temp:  [99.4 F (37.4 C)] 99.4 F (37.4 C) (12/18 1615) Pulse Rate:  [94-113] 98 (12/19 0843) Resp:  [18-28] 25 (12/19 0843) BP: (103-135)/(52-76) 103/55 (12/19 0630) SpO2:  [95 %-100 %] 99 % (12/19 0845) Weight:  [91.6 kg] 91.6 kg (12/18 1613) Physical Exam Vitals reviewed.  Constitutional:      Appearance: Normal appearance.  HENT:     Head: Normocephalic and atraumatic.     Nose: Nose normal. No  congestion.     Mouth/Throat:     Mouth: Mucous membranes are dry.  Cardiovascular:     Rate and Rhythm: Normal rate and regular rhythm.  Pulmonary:     Effort: Pulmonary effort is normal. No respiratory distress.  Abdominal:     General: Abdomen is flat. There is no distension.  Musculoskeletal:        General: No swelling. Normal range of motion.     Cervical back: Normal range of motion and neck supple.  Skin:    General: Skin is warm and dry.  Neurological:     General: No focal deficit present.     Mental Status: She is alert and oriented to person, place,  and time.  Psychiatric:        Mood and Affect: Mood normal.        Behavior: Behavior normal.        Thought Content: Thought content normal.        Judgment: Judgment normal.     Laboratory Data:  Results for orders placed or performed during the hospital encounter of 02/28/22 (from the past 72 hour(s))  Urinalysis, Routine w reflex microscopic Urine, Clean Catch     Status: Abnormal   Collection Time: 02/28/22  4:15 PM  Result Value Ref Range   Color, Urine AMBER (A) YELLOW    Comment: BIOCHEMICALS MAY BE AFFECTED BY COLOR   APPearance TURBID (A) CLEAR   Specific Gravity, Urine 1.013 1.005 - 1.030   pH 7.0 5.0 - 8.0   Glucose, UA NEGATIVE NEGATIVE mg/dL   Hgb urine dipstick MODERATE (A) NEGATIVE   Bilirubin Urine NEGATIVE NEGATIVE   Ketones, ur NEGATIVE NEGATIVE mg/dL   Protein, ur >=300 (A) NEGATIVE mg/dL   Nitrite POSITIVE (A) NEGATIVE   Leukocytes,Ua LARGE (A) NEGATIVE   RBC / HPF 11-20 0 - 5 RBC/hpf   WBC, UA >50 (H) 0 - 5 WBC/hpf   Bacteria, UA MANY (A) NONE SEEN   Squamous Epithelial / LPF 6-10 0 - 5   WBC Clumps PRESENT    Mucus PRESENT     Comment: Performed at [facility] [address] [place], [place] [address]  Resp panel by RT-PCR (RSV, Flu A&B, Covid) Anterior Nasal Swab     Status: None   Collection Time: 02/28/22  4:18 PM   Specimen: Anterior Nasal Swab  Result Value Ref Range   SARS  Coronavirus 2 by RT PCR NEGATIVE NEGATIVE    Comment: (NOTE) SARS-CoV-2 target nucleic acids are NOT DETECTED.  The SARS-CoV-2 RNA is generally detectable in upper respiratory specimens during the acute phase of infection. The lowest concentration of SARS-CoV-2 viral copies this assay can detect is 138 copies/mL. A negative result does not preclude SARS-Cov-2 infection and should not be used as the sole basis for treatment or other patient management decisions. A negative result may occur with  improper specimen collection/handling, submission of specimen other than nasopharyngeal swab, presence of viral mutation(s) within the areas targeted by this assay, and inadequate number of viral copies(<138 copies/mL). A negative result must be combined with clinical observations, patient history, and epidemiological information. The expected result is Negative.  Fact Sheet for Patients:  EntrepreneurPulse.com.au  Fact Sheet for Healthcare Providers:  IncredibleEmployment.be  This test is no t yet approved or cleared by the [place] FDA and  has been authorized for detection and/or diagnosis of SARS-CoV-2 by FDA under an Emergency Use Authorization (EUA). This EUA will remain  in effect (meaning this test can be used) for the duration of the COVID-19 declaration under Section 564(b)(1) of the Act, 21 U.S.C.section 360bbb-3(b)(1), unless the authorization is terminated  or revoked sooner.       Influenza A by PCR NEGATIVE NEGATIVE   Influenza B by PCR NEGATIVE NEGATIVE    Comment: (NOTE) The Xpert Xpress SARS-CoV-2/FLU/RSV plus assay is intended as an aid in the diagnosis of influenza from Nasopharyngeal swab specimens and should not be used as a sole basis for treatment. Nasal washings and aspirates are unacceptable for Xpert Xpress SARS-CoV-2/FLU/RSV testing.  Fact Sheet for Patients: EntrepreneurPulse.com.au  Fact Sheet  for Healthcare Providers: IncredibleEmployment.be  This test is not yet approved or cleared by the [place]  States FDA and has been authorized for detection and/or diagnosis of SARS-CoV-2 by FDA under an Emergency Use Authorization (EUA). This EUA will remain in effect (meaning this test can be used) for the duration of the COVID-19 declaration under Section 564(b)(1) of the Act, 21 U.S.C. section 360bbb-3(b)(1), unless the authorization is terminated or revoked.     Resp Syncytial Virus by PCR NEGATIVE NEGATIVE    Comment: (NOTE) Fact Sheet for Patients: EntrepreneurPulse.com.au  Fact Sheet for Healthcare Providers: IncredibleEmployment.be  This test is not yet approved or cleared by the [place] FDA and has been authorized for detection and/or diagnosis of SARS-CoV-2 by FDA under an Emergency Use Authorization (EUA). This EUA will remain in effect (meaning this test can be used) for the duration of the COVID-19 declaration under Section 564(b)(1) of the Act, 21 U.S.C. section 360bbb-3(b)(1), unless the authorization is terminated or revoked.  Performed at [facility], [address], [place], [place] [address]   CBC with Differential     Status: Abnormal   Collection Time: 02/28/22  5:41 PM  Result Value Ref Range   WBC 14.9 (H) 4.0 - 10.5 K/uL   RBC 4.63 3.87 - 5.11 MIL/uL   Hemoglobin 13.9 12.0 - 15.0 g/dL   HCT 42.6 36.0 - 46.0 %   MCV 92.0 80.0 - 100.0 fL   MCH 30.0 26.0 - 34.0 pg   MCHC 32.6 30.0 - 36.0 g/dL   RDW 13.7 11.5 - 15.5 %   Platelets 180 150 - 400 K/uL   nRBC 0.0 0.0 - 0.2 %   Neutrophils Relative % 85 %   Neutro Abs 12.6 (H) 1.7 - 7.7 K/uL   Lymphocytes Relative 5 %   Lymphs Abs 0.7 0.7 - 4.0 K/uL   Monocytes Relative 9 %   Monocytes Absolute 1.4 (H) 0.1 - 1.0 K/uL   Eosinophils Relative 0 %   Eosinophils Absolute 0.1 0.0 - 0.5 K/uL   Basophils Relative 0 %   Basophils Absolute 0.0 0.0 - 0.1  K/uL   Immature Granulocytes 1 %   Abs Immature Granulocytes 0.14 (H) 0.00 - 0.07 K/uL    Comment: Performed at [facility], [address] [place], [place] [address]  Comprehensive metabolic panel     Status: Abnormal   Collection Time: 02/28/22  5:41 PM  Result Value Ref Range   Sodium 137 135 - 145 mmol/L   Potassium 4.1 3.5 - 5.1 mmol/L   Chloride 107 98 - 111 mmol/L   CO2 20 (L) 22 - 32 mmol/L   Glucose, Bld 139 (H) 70 - 99 mg/dL    Comment: Glucose reference range applies only to samples taken after fasting for at least 8 hours.   BUN 29 (H) 6 - 20 mg/dL   Creatinine, Ser 1.24 (H) 0.44 - 1.00 mg/dL   Calcium 9.3 8.9 - 10.3 mg/dL   Total Protein 6.9 6.5 - 8.1 g/dL   Albumin 3.3 (L) 3.5 - 5.0 g/dL   AST 20 15 - 41 U/L   ALT 23 0 - 44 U/L   Alkaline Phosphatase 90 38 - 126 U/L   Total Bilirubin 0.9 0.3 - 1.2 mg/dL   GFR, Estimated 50 (L) >60 mL/min    Comment: (NOTE) Calculated using the CKD-EPI Creatinine Equation (2021)    Anion gap 10 5 - 15    Comment: Performed at [facility], [address] [place], [place] [address]  Lipase, blood     Status: None   Collection  Time: 02/28/22  5:41 PM  Result Value Ref Range   Lipase 34 11 - 51 U/L    Comment: Performed at [facility], [address] [place], [place] [address]  Basic metabolic panel     Status: Abnormal   Collection Time: 03/01/22  4:39 AM  Result Value Ref Range   Sodium 138 135 - 145 mmol/L   Potassium 3.5 3.5 - 5.1 mmol/L   Chloride 108 98 - 111 mmol/L   CO2 21 (L) 22 - 32 mmol/L   Glucose, Bld 121 (H) 70 - 99 mg/dL    Comment: Glucose reference range applies only to samples taken after fasting for at least 8 hours.   BUN 30 (H) 6 - 20 mg/dL   Creatinine, Ser 1.31 (H) 0.44 - 1.00 mg/dL   Calcium 8.4 (L) 8.9 - 10.3 mg/dL   GFR, Estimated 47 (L) >60 mL/min    Comment: (NOTE) Calculated using the CKD-EPI Creatinine Equation (2021)    Anion gap 9 5 - 15    Comment: Performed at [facility], [address], [place], [place] [address]  CBC     Status: Abnormal   Collection Time: 03/01/22  4:39 AM  Result Value Ref Range   WBC 14.5 (H) 4.0 - 10.5 K/uL   RBC 3.91 3.87 - 5.11 MIL/uL   Hemoglobin 12.0 12.0 - 15.0 g/dL   HCT 36.5 36.0 - 46.0 %   MCV 93.4 80.0 - 100.0 fL   MCH 30.7 26.0 - 34.0 pg   MCHC 32.9 30.0 - 36.0 g/dL   RDW 13.7 11.5 - 15.5 %   Platelets 177 150 - 400 K/uL   nRBC 0.0 0.0 - 0.2 %    Comment: Performed at [facility], [address] [place], [place] [address]  HIV Antibody (routine testing w rflx)     Status: None   Collection Time: 03/01/22  4:39 AM  Result Value Ref Range   HIV Screen 4th Generation wRfx Non Reactive Non Reactive    Comment: Performed at [facility], [place] [address] [place], [place] [address]   Recent Results (from the past 240 hour(s))  Resp panel by RT-PCR (RSV, Flu A&B, Covid) Anterior Nasal Swab     Status: None   Collection Time: 02/28/22  4:18 PM   Specimen: Anterior Nasal Swab  Result Value Ref Range Status   SARS Coronavirus 2 by RT PCR NEGATIVE NEGATIVE Final    Comment: (NOTE) SARS-CoV-2 target nucleic acids are NOT DETECTED.  The SARS-CoV-2 RNA is generally detectable in upper respiratory specimens during the acute phase of infection. The lowest concentration of SARS-CoV-2 viral copies this assay can detect is 138 copies/mL. A negative result does not preclude SARS-Cov-2 infection and should not be used as the sole basis for treatment or other patient management decisions. A negative result may occur with  improper specimen collection/handling, submission of specimen other than nasopharyngeal swab, presence of viral mutation(s) within the areas targeted by this assay, and inadequate number of viral copies(<138 copies/mL). A negative result must be combined with clinical observations, patient history, and epidemiological information. The expected result is Negative.  Fact Sheet for Patients:   EntrepreneurPulse.com.au  Fact Sheet for Healthcare Providers:  IncredibleEmployment.be  This test is no t yet approved or cleared by the [place] FDA and  has been authorized for detection and/or diagnosis of SARS-CoV-2 by FDA under an Emergency Use Authorization (EUA). This EUA will remain  in effect (meaning this  test can be used) for the duration of the COVID-19 declaration under Section 564(b)(1) of the Act, 21 U.S.C.section 360bbb-3(b)(1), unless the authorization is terminated  or revoked sooner.       Influenza A by PCR NEGATIVE NEGATIVE Final   Influenza B by PCR NEGATIVE NEGATIVE Final    Comment: (NOTE) The Xpert Xpress SARS-CoV-2/FLU/RSV plus assay is intended as an aid in the diagnosis of influenza from Nasopharyngeal swab specimens and should not be used as a sole basis for treatment. Nasal washings and aspirates are unacceptable for Xpert Xpress SARS-CoV-2/FLU/RSV testing.  Fact Sheet for Patients: EntrepreneurPulse.com.au  Fact Sheet for Healthcare Providers: IncredibleEmployment.be  This test is not yet approved or cleared by the [place] FDA and has been authorized for detection and/or diagnosis of SARS-CoV-2 by FDA under an Emergency Use Authorization (EUA). This EUA will remain in effect (meaning this test can be used) for the duration of the COVID-19 declaration under Section 564(b)(1) of the Act, 21 U.S.C. section 360bbb-3(b)(1), unless the authorization is terminated or revoked.     Resp Syncytial Virus by PCR NEGATIVE NEGATIVE Final    Comment: (NOTE) Fact Sheet for Patients: EntrepreneurPulse.com.au  Fact Sheet for Healthcare Providers: IncredibleEmployment.be  This test is not yet approved or cleared by the [place] FDA and has been authorized for detection and/or diagnosis of SARS-CoV-2 by FDA under an Emergency Use  Authorization (EUA). This EUA will remain in effect (meaning this test can be used) for the duration of the COVID-19 declaration under Section 564(b)(1) of the Act, 21 U.S.C. section 360bbb-3(b)(1), unless the authorization is terminated or revoked.  Performed at [facility], [address] [place],  [address]    Creatinine: Recent Labs    02/28/22 1741 03/01/22 0439  CREATININE 1.24* 1.31*   Baseline Creatinine: 1  Impression/Assessment:  61yo with bilateral UPJ calculi  Plan:  -We discussed the management of kidney stones. These options include observation, ureteroscopy, shockwave lithotripsy (ESWL) and percutaneous nephrolithotomy (PCNL). We discussed which options are relevant to the patient's stone(s). We discussed the natural history of kidney stones as well as the complications of untreated stones and the impact on quality of life without treatment as well as with each of the above listed treatments. We also discussed the efficacy of each treatment in its ability to clear the stone burden. With any of these management options I discussed the signs and symptoms of infection and the need for emergent treatment should these be experienced. For each option we discussed the ability of each procedure to clear the patient of their stone burden.   For observation I described the risks which include but are not limited to silent renal damage, life-threatening infection, need for emergent surgery, failure to pass stone and pain.   For ureteroscopy I described the risks which include bleeding, infection, damage to contiguous structures, positioning injury, ureteral stricture, ureteral avulsion, ureteral injury, need for prolonged ureteral stent, inability to perform ureteroscopy, need for an interval procedure, inability to clear stone burden, stent discomfort/pain, heart attack, stroke, pulmonary embolus and the inherent risks with general anesthesia.   For shockwave lithotripsy I  described the risks which include arrhythmia, kidney contusion, kidney hemorrhage, need for transfusion, pain, inability to adequately break up stone, inability to pass stone fragments, Steinstrasse, infection associated with obstructing stones, need for alternate surgical procedure, need for repeat shockwave lithotripsy, MI, CVA, PE and the inherent risks with anesthesia/conscious sedation.   For PCNL I described the risks including positioning injury,  pneumothorax, hydrothorax, need for chest tube, inability to clear stone burden, renal laceration, arterial venous fistula or malformation, need for embolization of kidney, loss of kidney or renal function, need for repeat procedure, need for prolonged nephrostomy tube, ureteral avulsion, MI, CVA, PE and the inherent risks of general anesthesia.   - The patient would like to proceed with bilateral ureteral stent placement  Nicolette Bang 03/01/2022, 11:42 AM

## 2022-03-01 NOTE — Progress Notes (Incomplete)
PROGRESS NOTE     Melanie Cordova, is a 61 y.o. female, DOB - 1960-08-14, CHE:[id number]  Admit date - 02/28/2022   Admitting Physician Josaiah Muhammed Denton Brick, MD  Outpatient Primary MD for the patient is Fanta, Normajean Baxter, MD  LOS - 0  Chief Complaint  Patient presents with   URI        Brief Narrative:   61 y.o. female with medical history significant of hypothyroidism, hypertension, hyperlipidemia, Chronic CHF (systolic), Hx of CVA, seizure do, Gerd, Tobacco dep, Copd, apparently presents with c/o bilateral flank pain x 4 days, along with bilateral inguinal discomfort and suprapubic discomfort, and dysuria, and subjective fever x 4days.   Pt denies cough, cp, palp, sob, diarrhea, brbpr, black stool, gross hematuria.   ***  ***    -Assessment and Plan: No notes have been filed under this hospital service. Service: Hospitalist     *** Disposition/Need for in-Hospital Stay- patient unable to be discharged at this time due to ****  Status is: Inpatient ***  Disposition: The patient is from: {From:23814}              Anticipated d/c is to: {To:23815}              Anticipated d/c date is: {Days:23816}              Patient currently {Medically stable:23817} Barriers: Not Clinically Stable- ***  Code Status : *** -  Code Status: Full Code   Family Communication:   *** NA (patient is alert, awake and coherent) ***  DVT Prophylaxis  :   - SCDs ******  enoxaparin (LOVENOX) injection 40 mg Start: 03/01/22 0800 SCDs Start: 03/01/22 0306   Lab Results  Component Value Date   PLT 177 03/01/2022    Inpatient Medications  Scheduled Meds:  aspirin EC  81 mg Oral Daily   budesonide-formoterol  2 puff Inhalation BID   carvedilol  25 mg Oral BID WC   enoxaparin (LOVENOX) injection  40 mg Subcutaneous Q24H   furosemide  40 mg Oral BID   levothyroxine  200 mcg Oral Q0600   loratadine  10 mg Oral Daily   pantoprazole  40 mg Oral Daily   rosuvastatin  20 mg Oral Daily    sacubitril-valsartan  1 tablet Oral BID   Continuous Infusions:  levofloxacin (LEVAQUIN) IV     PRN Meds:.acetaminophen **OR** acetaminophen, albuterol, hydrALAZINE, morphine injection, ondansetron (ZOFRAN) IV   Anti-infectives (From admission, onward)    Start     Dose/Rate Route Frequency Ordered Stop   03/01/22 2100  levofloxacin (LEVAQUIN) IVPB 500 mg        500 mg 100 mL/hr over 60 Minutes Intravenous Every 24 hours 03/01/22 0305     02/28/22 2130  levofloxacin (LEVAQUIN) IVPB 500 mg        500 mg 100 mL/hr over 60 Minutes Intravenous  Once 02/28/22 2129 03/01/22 0002         Subjective: Melanie Cordova today has no fevers, no emesis,  No chest pain,  ***   Objective: Vitals:   03/01/22 1530 03/01/22 1644 03/01/22 1741 03/01/22 1951  BP: 100/62 (!) 96/57 (!) 98/53 107/63  Pulse: (!) 101 89 99 95  Resp: (!) 22 (!) 24 (!) 24 20  Temp: 98.2 F (36.8 C) 98.6 F (37 C)  98.5 F (36.9 C)  TempSrc: Oral Oral  Oral  SpO2: 98% 95% 97% 97%  Weight:      Height:  Intake/Output Summary (Last 24 hours) at 03/01/2022 2034 Last data filed at 03/01/2022 1700 Gross per 24 hour  Intake 1513.12 ml  Output 351 ml  Net 1162.12 ml   Filed Weights   02/28/22 1613 03/01/22 1235  Weight: 91.6 kg 91.6 kg    Physical Exam  Gen:- Awake Alert,  *** HEENT:- [place].AT, No sclera icterus Neck-Supple Neck,No JVD,.  Lungs-  CTAB , fair symmetrical air movement CV- S1, S2 normal, regular  Abd-  +ve B.Sounds, Abd Soft, No tenderness,    Extremity/Skin:- No  edema, pedal pulses present *** Psych-affect is appropriate, oriented x3 Neuro-no new focal deficits, no tremors  Data Reviewed: I have personally reviewed following labs and imaging studies  CBC: Recent Labs  Lab 02/28/22 1741 03/01/22 0439  WBC 14.9* 14.5*  NEUTROABS 12.6*  --   HGB 13.9 12.0  HCT 42.6 36.5  MCV 92.0 93.4  PLT 180 314   Basic Metabolic Panel: Recent Labs  Lab 02/28/22 1741 03/01/22 0439   NA 137 138  K 4.1 3.5  CL 107 108  CO2 20* 21*  GLUCOSE 139* 121*  BUN 29* 30*  CREATININE 1.24* 1.31*  CALCIUM 9.3 8.4*   GFR: Estimated Creatinine Clearance: 53.1 mL/min (A) (by C-G formula based on SCr of 1.31 mg/dL (H)). Liver Function Tests: Recent Labs  Lab 02/28/22 1741  AST 20  ALT 23  ALKPHOS 90  BILITOT 0.9  PROT 6.9  ALBUMIN 3.3*   Cardiac Enzymes: No results for input(s): "CKTOTAL", "CKMB", "CKMBINDEX", "TROPONINI" in the last 168 hours. BNP (last 3 results) No results for input(s): "PROBNP" in the last 8760 hours. HbA1C: No results for input(s): "HGBA1C" in the last 72 hours. Sepsis Labs: '@LABRCNTIP'$ (procalcitonin:4,lacticidven:4) ) Recent Results (from the past 240 hour(s))  Resp panel by RT-PCR (RSV, Flu A&B, Covid) Anterior Nasal Swab     Status: None   Collection Time: 02/28/22  4:18 PM   Specimen: Anterior Nasal Swab  Result Value Ref Range Status   SARS Coronavirus 2 by RT PCR NEGATIVE NEGATIVE Final    Comment: (NOTE) SARS-CoV-2 target nucleic acids are NOT DETECTED.  The SARS-CoV-2 RNA is generally detectable in upper respiratory specimens during the acute phase of infection. The lowest concentration of SARS-CoV-2 viral copies this assay can detect is 138 copies/mL. A negative result does not preclude SARS-Cov-2 infection and should not be used as the sole basis for treatment or other patient management decisions. A negative result may occur with  improper specimen collection/handling, submission of specimen other than nasopharyngeal swab, presence of viral mutation(s) within the areas targeted by this assay, and inadequate number of viral copies(<138 copies/mL). A negative result must be combined with clinical observations, patient history, and epidemiological information. The expected result is Negative.  Fact Sheet for Patients:  EntrepreneurPulse.com.au  Fact Sheet for Healthcare Providers:   IncredibleEmployment.be  This test is no t yet approved or cleared by the [place] FDA and  has been authorized for detection and/or diagnosis of SARS-CoV-2 by FDA under an Emergency Use Authorization (EUA). This EUA will remain  in effect (meaning this test can be used) for the duration of the COVID-19 declaration under Section 564(b)(1) of the Act, 21 U.S.C.section 360bbb-3(b)(1), unless the authorization is terminated  or revoked sooner.       Influenza A by PCR NEGATIVE NEGATIVE Final   Influenza B by PCR NEGATIVE NEGATIVE Final    Comment: (NOTE) The Xpert Xpress SARS-CoV-2/FLU/RSV plus assay is intended as an aid in  the diagnosis of influenza from Nasopharyngeal swab specimens and should not be used as a sole basis for treatment. Nasal washings and aspirates are unacceptable for Xpert Xpress SARS-CoV-2/FLU/RSV testing.  Fact Sheet for Patients: EntrepreneurPulse.com.au  Fact Sheet for Healthcare Providers: IncredibleEmployment.be  This test is not yet approved or cleared by the [place] FDA and has been authorized for detection and/or diagnosis of SARS-CoV-2 by FDA under an Emergency Use Authorization (EUA). This EUA will remain in effect (meaning this test can be used) for the duration of the COVID-19 declaration under Section 564(b)(1) of the Act, 21 U.S.C. section 360bbb-3(b)(1), unless the authorization is terminated or revoked.     Resp Syncytial Virus by PCR NEGATIVE NEGATIVE Final    Comment: (NOTE) Fact Sheet for Patients: EntrepreneurPulse.com.au  Fact Sheet for Healthcare Providers: IncredibleEmployment.be  This test is not yet approved or cleared by the [place] FDA and has been authorized for detection and/or diagnosis of SARS-CoV-2 by FDA under an Emergency Use Authorization (EUA). This EUA will remain in effect (meaning this test can be used) for  the duration of the COVID-19 declaration under Section 564(b)(1) of the Act, 21 U.S.C. section 360bbb-3(b)(1), unless the authorization is terminated or revoked.  Performed at [facility], [address], [place], [place] [address]       Radiology Studies: DG C-Arm 1-60 Min  Result Date: 03/01/2022 CLINICAL DATA:  Fluoro guidance provided EXAM: DG C-ARM 1-60 MIN FINDINGS: Dose: 12.2 mGy Fluoro time 30s IMPRESSION: C-arm fluoro guidance provided. Electronically Signed   By: Sammie Bench M.D.   On: 03/01/2022 14:02   CT ABDOMEN PELVIS WO CONTRAST  Result Date: 02/28/2022 CLINICAL DATA:  Flank pain, fever, constipation, decreased urinary output EXAM: CT ABDOMEN AND PELVIS WITHOUT CONTRAST TECHNIQUE: Multidetector CT imaging of the abdomen and pelvis was performed following the standard protocol without IV contrast. RADIATION DOSE REDUCTION: This exam was performed according to the departmental dose-optimization program which includes automated exposure control, adjustment of the mA and/or kV according to patient size and/or use of iterative reconstruction technique. COMPARISON:  None Available. FINDINGS: Lower chest: Lung bases are clear. Hepatobiliary: Unenhanced liver is unremarkable. Gallbladder is unremarkable. No intrahepatic or extrahepatic duct dilatation. Pancreas: Within normal limits. Spleen: Within normal limits. Adrenals/Urinary Tract: Bilateral renal nodules, measuring 2.1 cm on the right and 3.8 cm on the left, both measuring less than 10 HUs and compatible with benign adrenal adenomas. No follow-up is recommended. Right kidney is notable for a 1.9 cm calculus in the right renal pelvis with mild periureteral stranding and mild pelvic fullness. 2 nonobstructing left lower pole renal calculi measuring up to 5 mm (series 2/image 41). Associated 11 mm proximal left ureteral calculus at the L4 level (series 2/image 43), without hydronephrosis. Bladder is within normal limits.  Stomach/Bowel: Stomach is notable for a tiny hiatal hernia. No evidence of bowel obstruction. Normal appendix (series 2/image 70). No colonic wall thickening or inflammatory changes. Vascular/Lymphatic: No evidence of abdominal aortic aneurysm. Atherosclerotic calcifications of the abdominal aorta and branch vessels. No suspicious abdominopelvic lymphadenopathy. Reproductive: Uterus is within normal limits. Bilateral ovaries are within normal limits. Other: No abdominopelvic ascites. Musculoskeletal: Visualized osseous structures are within normal limits. IMPRESSION: 1.9 cm calculus in the right renal pelvis with mild pelvic fullness. 11 mm proximal left ureteral calculus at the L4 level, without hydronephrosis. Two nonobstructing left lower pole renal calculi measuring up to 5 mm. Additional ancillary findings as above. Electronically Signed   By: Henderson Newcomer.D.  On: 02/28/2022 20:58   DG Abdomen Acute W/Chest  Result Date: 02/28/2022 CLINICAL DATA:  cough, constipation EXAM: DG ABDOMEN ACUTE WITH 1 VIEW CHEST COMPARISON:  November 10, 2020 FINDINGS: Incomplete assessment of the lateral soft tissues. Evaluation is limited secondary to underpenetration. Air and stool filled nondilated loops of bowel throughout the abdomen. No definitive free air noted. AICD cardiac loop recorder. No pleural effusion or pneumothorax. LEFT basilar scarring/atelectasis. Atherosclerotic calcifications of the aorta. There is a calcific density projecting just lateral to the left L4 transverse process which measures 12 mm. IMPRESSION: 1. Nonobstructive bowel gas pattern. 2. 12 mm calcific density projecting just lateral to the left L4 transverse process. This could reflect a ureteral stone versus enteric debris. Recommend correlation with point tenderness. If concern for obstructing nephrolithiasis, recommend dedicated CT abdomen pelvis without contrast. 3. No acute cardiopulmonary abnormality. Electronically Signed   By:  Valentino Saxon M.D.   On: 02/28/2022 17:48     Scheduled Meds:  aspirin EC  81 mg Oral Daily   budesonide-formoterol  2 puff Inhalation BID   carvedilol  25 mg Oral BID WC   enoxaparin (LOVENOX) injection  40 mg Subcutaneous Q24H   furosemide  40 mg Oral BID   levothyroxine  200 mcg Oral Q0600   loratadine  10 mg Oral Daily   pantoprazole  40 mg Oral Daily   rosuvastatin  20 mg Oral Daily   sacubitril-valsartan  1 tablet Oral BID   Continuous Infusions:  levofloxacin (LEVAQUIN) IV       LOS: 0 days    Roxan Hockey M.D on 03/01/2022 at 8:34 PM  Go to [email or web address] - for contact info  [facility] - Office  [phone]  If 7PM-7AM, please contact night-coverage [email or web address] 03/01/2022, 8:34 PM

## 2022-03-01 NOTE — Op Note (Signed)
Preoperative diagnosis: bilateral ureteral calculi, sepsis  Postoperative diagnosis: Same  Procedure: 1 cystoscopy 2. bilateralretrograde pyelography 3.  Intraoperative fluoroscopy, under one hour, with interpretation 4.  bilateral 6 x 26 JJ stent placement  Attending: Rosie Fate  Anesthesia: General  Estimated blood loss: None  Drains: bilateral 6 x 26 JJ ureteral stent without tether  Specimens: none  Antibiotics: levaquin  Findings: bilateral UPJ stones. Mild bilateral hydronephrosis. No masses/lesions in the bladder. Ureteral orifices in normal anatomic location.  Indications: Patient is a 61 year old female with a history of bilateral UPJ stone and concern for sepsis. After discussing treatment options, they decided proceed with bilateral stent placement.  Procedure in detail: The patient was brought to the operating room and a brief timeout was done to ensure correct patient, correct procedure, correct site.  General anesthesia was administered patient was placed in dorsal lithotomy position.  Her genitalia was then prepped and draped in usual sterile fashion.  A rigid 72 French cystoscope was passed in the urethra and the bladder.  Bladder was inspected free masses or lesions.  the ureteral orifices were in the normal orthotopic locations. a 6 french ureteral catheter was then instilled into the left ureteral orifice.  a gentle retrograde was obtained and findings noted above. We then advanced a zipwire up to the renal pelvis. We then placed a 6 x 26 double-j ureteral stent over the zip wire. We then removed the wire and good coil was noted in the the renal pelvis under fluoroscopy and the bladder under direct vision.  We then turned out attention to the right side.  a gentle retrograde was obtained and findings noted above. We then advanced a zipwire up to the renal pelvis. we then placed a 6 x 26 double-j ureteral stent over the original zip wire.  We then removed the wire  and good coil was noted in the the renal pelvis under fluoroscopy and the bladder under direct vision.  the bladder was then drained and this concluded the procedure which was well tolerated by patient.  Complications: None  Condition: Stable, extubated, transferred to PACU  Plan: Patient is to be admitted for IV antibiotics

## 2022-03-01 NOTE — Progress Notes (Addendum)
Patient seen and evaluated, chart reviewed, please see EMR for updated orders. Please see full H&P dictated by admitting physician Dr Jani Gravel for same date of service.     Brief Summary:-  61 y.o. female with medical history significant of hypothyroidism, hypertension, hyperlipidemia, Chronic CHF (systolic), Hx of CVA, seizure do, Gerd, Tobacco dep, Copd-admitted on 03/01/2022 with sepsis secondary to urinary source in the setting of bilateral ureteral calculi   A/p 1) sepsis secondary to acute complicated UTI----in the setting of bilateral ureteral calculi and hydronephrosis -urology consult appreciated-on 03/01/2022 patient underwent cystoscopy with bilateral retrograde pyelography and intraoperative fluoroscopy and placement of bilateral JJ stent -Continue Levaquin pending urine culture data  2)AKI----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    creatinine on admission= 1.31 , baseline creatinine =0.7 to 0.80 --renally adjust medications, avoid nephrotoxic agents / dehydration  / hypotension  3)HFrEF-patient with combined chronic systolic and diastolic dysfunction CHF -Status post [employer] AICD implantation echo from March 2022 with EF of 45 to 5% and grade 1 diastolic dysfunction -Appears compensated at this time -Continue Entresto, Coreg and Lasix  4)COPD/ongoing tobacco abuse--- bronchodilators as ordered  5) hypothyroidism--continue levothyroxine recent TSH WNL  6)GERD----Protonix  7)HLD-continue Crestor  Total care time 56 minutes  Patient seen and evaluated, chart reviewed, please see EMR for updated orders. Please see full H&P dictated by admitting physician Dr Jani Gravel for same date of service.   Roxan Hockey, MD

## 2022-03-01 NOTE — Anesthesia Preprocedure Evaluation (Signed)
Anesthesia Evaluation  Patient identified by MRN, date of birth, ID band Patient awake    Reviewed: Allergy & Precautions, H&P , NPO status , Patient's Chart, lab work & pertinent test results, reviewed documented beta blocker date and time   Airway Mallampati: II  TM Distance: >3 FB Neck ROM: full    Dental no notable dental hx.    Pulmonary shortness of breath, asthma , pneumonia, COPD, Current Smoker   Pulmonary exam normal breath sounds clear to auscultation       Cardiovascular Exercise Tolerance: Good hypertension, +CHF  + dysrhythmias + Cardiac Defibrillator  Rhythm:regular Rate:Normal     Neuro/Psych  Headaches, Seizures -,   Anxiety     TIA Neuromuscular disease CVA, No Residual Symptoms  negative psych ROS   GI/Hepatic Neg liver ROS,GERD  Medicated,,  Endo/Other  Hypothyroidism    Renal/GU negative Renal ROS  negative genitourinary   Musculoskeletal   Abdominal   Peds  Hematology negative hematology ROS (+)   Anesthesia Other Findings   Reproductive/Obstetrics negative OB ROS                             Anesthesia Physical Anesthesia Plan  ASA: 4 and emergent  Anesthesia Plan: General and General LMA   Post-op Pain Management:    Induction:   PONV Risk Score and Plan: Ondansetron  Airway Management Planned:   Additional Equipment:   Intra-op Plan:   Post-operative Plan:   Informed Consent: I have reviewed the patients History and Physical, chart, labs and discussed the procedure including the risks, benefits and alternatives for the proposed anesthesia with the patient or authorized representative who has indicated his/her understanding and acceptance.     Dental Advisory Given  Plan Discussed with: CRNA  Anesthesia Plan Comments:        Anesthesia Quick Evaluation

## 2022-03-01 NOTE — Transfer of Care (Signed)
Immediate Anesthesia Transfer of Care Note  Patient: [name]  Procedure(s) Performed: CYSTOSCOPY WITH RETROGRADE PYELOGRAM/URETERAL STENT PLACEMENT (Bilateral: Urethra)  Patient Location: PACU  Anesthesia Type:General  Level of Consciousness: awake, alert , oriented, and patient cooperative  Airway & Oxygen Therapy: Patient Spontanous Breathing  Post-op Assessment: Report given to RN and Post -op Vital signs reviewed and stable  Post vital signs: Reviewed and stable  Last Vitals:  Vitals Value Taken Time  BP 89/44 03/01/22 1400  Temp 99.9   Pulse 103 03/01/22 1400  Resp 29 03/01/22 1400  SpO2 96 % 03/01/22 1400  Vitals shown include unvalidated device data.  Last Pain:  Vitals:   03/01/22 1235  TempSrc: Oral  PainSc: 7       Patients Stated Pain Goal: 3 (29/79/89 2119)  Complications: No notable events documented.

## 2022-03-02 DIAGNOSIS — I10 Essential (primary) hypertension: Secondary | ICD-10-CM

## 2022-03-02 DIAGNOSIS — N179 Acute kidney failure, unspecified: Secondary | ICD-10-CM

## 2022-03-02 DIAGNOSIS — E782 Mixed hyperlipidemia: Secondary | ICD-10-CM

## 2022-03-02 DIAGNOSIS — N200 Calculus of kidney: Secondary | ICD-10-CM | POA: Diagnosis not present

## 2022-03-02 DIAGNOSIS — N135 Crossing vessel and stricture of ureter without hydronephrosis: Secondary | ICD-10-CM | POA: Diagnosis not present

## 2022-03-02 DIAGNOSIS — J449 Chronic obstructive pulmonary disease, unspecified: Secondary | ICD-10-CM

## 2022-03-02 DIAGNOSIS — D72829 Elevated white blood cell count, unspecified: Secondary | ICD-10-CM | POA: Diagnosis not present

## 2022-03-02 LAB — CBC
HCT: 36.5 % (ref 36.0–46.0)
Hemoglobin: 11.7 g/dL — ABNORMAL LOW (ref 12.0–15.0)
MCH: 29.7 pg (ref 26.0–34.0)
MCHC: 32.1 g/dL (ref 30.0–36.0)
MCV: 92.6 fL (ref 80.0–100.0)
Platelets: 183 10*3/uL (ref 150–400)
RBC: 3.94 MIL/uL (ref 3.87–5.11)
RDW: 13.5 % (ref 11.5–15.5)
WBC: 12 10*3/uL — ABNORMAL HIGH (ref 4.0–10.5)
nRBC: 0 % (ref 0.0–0.2)

## 2022-03-02 LAB — BASIC METABOLIC PANEL
Anion gap: 9 (ref 5–15)
BUN: 24 mg/dL — ABNORMAL HIGH (ref 6–20)
CO2: 23 mmol/L (ref 22–32)
Calcium: 8.7 mg/dL — ABNORMAL LOW (ref 8.9–10.3)
Chloride: 109 mmol/L (ref 98–111)
Creatinine, Ser: 0.99 mg/dL (ref 0.44–1.00)
GFR, Estimated: >60 mL/min (ref >60)
Glucose, Bld: 143 mg/dL — ABNORMAL HIGH (ref 70–99)
Potassium: 3.5 mmol/L (ref 3.5–5.1)
Sodium: 141 mmol/L (ref 135–145)

## 2022-03-02 MED ORDER — LEVOFLOXACIN 750 MG PO TABS: 750.0000 mg | ORAL_TABLET | Freq: Every day | ORAL | 0 refills | Status: AC

## 2022-03-02 NOTE — Progress Notes (Signed)
1 Day Post-Op Subjective: Patient reports improvement in her flank pain. No fevers overnight. Urine clear. Uirne culture growing GNR  Objective: Vital signs in last 24 hours: Temp:  [97.7 F (36.5 C)-99.9 F (37.7 C)] 97.7 F (36.5 C) (12/20 0325) Pulse Rate:  [68-104] 86 (12/20 0813) Resp:  [16-24] 16 (12/20 0734) BP: (92-119)/(51-66) 119/64 (12/20 0813) SpO2:  [95 %-99 %] 95 % (12/20 0734) Weight:  [91.6 kg-94.4 kg] 94.4 kg (12/20 0500)  Intake/Output from previous day: 12/19 0701 - 12/20 0700 In: 1180 [P.O.:480; I.V.:600; IV Piggyback:100] Out: 1901 [Urine:1900; Blood:1] Intake/Output this shift: No intake/output data recorded.  Physical Exam:  General:alert, cooperative, and appears stated age GI: soft, non tender, normal bowel sounds, no palpable masses, no organomegaly, no inguinal hernia Female genitalia: not done Extremities: extremities normal, atraumatic, no cyanosis or edema  Lab Results: Recent Labs    02/28/22 1741 03/01/22 0439 03/02/22 0419  HGB 13.9 12.0 11.7*  HCT 42.6 36.5 36.5   BMET Recent Labs    03/01/22 0439 03/02/22 0419  NA 138 141  K 3.5 3.5  CL 108 109  CO2 21* 23  GLUCOSE 121* 143*  BUN 30* 24*  CREATININE 1.31* 0.99  CALCIUM 8.4* 8.7*   No results for input(s): "LABPT", "INR" in the last 72 hours. No results for input(s): "LABURIN" in the last 72 hours. Results for orders placed or performed during the hospital encounter of 02/28/22  Urine Culture     Status: Abnormal (Preliminary result)   Collection Time: 02/28/22  4:15 PM   Specimen: Urine, Clean Catch  Result Value Ref Range Status   Specimen Description   Final    URINE, CLEAN CATCH Performed at [facility], [address], [place], [place] [address]    Special Requests   Final    NONE Performed at [facility], [address], [place], [place] [address]    Culture (A)  Final    >=100,000 COLONIES/mL GRAM NEGATIVE RODS SUSCEPTIBILITIES TO FOLLOW CULTURE REINCUBATED  FOR BETTER GROWTH Performed at [facility] [address] [place], [place] [address]    Report Status PENDING  Incomplete  Resp panel by RT-PCR (RSV, Flu A&B, Covid) Anterior Nasal Swab     Status: None   Collection Time: 02/28/22  4:18 PM   Specimen: Anterior Nasal Swab  Result Value Ref Range Status   SARS Coronavirus 2 by RT PCR NEGATIVE NEGATIVE Final    Comment: (NOTE) SARS-CoV-2 target nucleic acids are NOT DETECTED.  The SARS-CoV-2 RNA is generally detectable in upper respiratory specimens during the acute phase of infection. The lowest concentration of SARS-CoV-2 viral copies this assay can detect is 138 copies/mL. A negative result does not preclude SARS-Cov-2 infection and should not be used as the sole basis for treatment or other patient management decisions. A negative result may occur with  improper specimen collection/handling, submission of specimen other than nasopharyngeal swab, presence of viral mutation(s) within the areas targeted by this assay, and inadequate number of viral copies(<138 copies/mL). A negative result must be combined with clinical observations, patient history, and epidemiological information. The expected result is Negative.  Fact Sheet for Patients:  EntrepreneurPulse.com.au  Fact Sheet for Healthcare Providers:  IncredibleEmployment.be  This test is no t yet approved or cleared by the [place] FDA and  has been authorized for detection and/or diagnosis of SARS-CoV-2 by FDA under an Emergency Use Authorization (EUA). This EUA will remain  in effect (meaning this test can be  used) for the duration of the COVID-19 declaration under Section 564(b)(1) of the Act, 21 U.S.C.section 360bbb-3(b)(1), unless the authorization is terminated  or revoked sooner.       Influenza A by PCR NEGATIVE NEGATIVE Final   Influenza B by PCR NEGATIVE NEGATIVE Final    Comment: (NOTE) The Xpert Xpress  SARS-CoV-2/FLU/RSV plus assay is intended as an aid in the diagnosis of influenza from Nasopharyngeal swab specimens and should not be used as a sole basis for treatment. Nasal washings and aspirates are unacceptable for Xpert Xpress SARS-CoV-2/FLU/RSV testing.  Fact Sheet for Patients: EntrepreneurPulse.com.au  Fact Sheet for Healthcare Providers: IncredibleEmployment.be  This test is not yet approved or cleared by the [place] FDA and has been authorized for detection and/or diagnosis of SARS-CoV-2 by FDA under an Emergency Use Authorization (EUA). This EUA will remain in effect (meaning this test can be used) for the duration of the COVID-19 declaration under Section 564(b)(1) of the Act, 21 U.S.C. section 360bbb-3(b)(1), unless the authorization is terminated or revoked.     Resp Syncytial Virus by PCR NEGATIVE NEGATIVE Final    Comment: (NOTE) Fact Sheet for Patients: EntrepreneurPulse.com.au  Fact Sheet for Healthcare Providers: IncredibleEmployment.be  This test is not yet approved or cleared by the [place] FDA and has been authorized for detection and/or diagnosis of SARS-CoV-2 by FDA under an Emergency Use Authorization (EUA). This EUA will remain in effect (meaning this test can be used) for the duration of the COVID-19 declaration under Section 564(b)(1) of the Act, 21 U.S.C. section 360bbb-3(b)(1), unless the authorization is terminated or revoked.  Performed at [facility], [address], [place], [place] [address]     Studies/Results: DG C-Arm 1-60 Min  Result Date: 03/01/2022 CLINICAL DATA:  Fluoro guidance provided EXAM: DG C-ARM 1-60 MIN FINDINGS: Dose: 12.2 mGy Fluoro time 30s IMPRESSION: C-arm fluoro guidance provided. Electronically Signed   By: Sammie Bench M.D.   On: 03/01/2022 14:02   CT ABDOMEN PELVIS WO CONTRAST  Result Date: 02/28/2022 CLINICAL DATA:  Flank  pain, fever, constipation, decreased urinary output EXAM: CT ABDOMEN AND PELVIS WITHOUT CONTRAST TECHNIQUE: Multidetector CT imaging of the abdomen and pelvis was performed following the standard protocol without IV contrast. RADIATION DOSE REDUCTION: This exam was performed according to the departmental dose-optimization program which includes automated exposure control, adjustment of the mA and/or kV according to patient size and/or use of iterative reconstruction technique. COMPARISON:  None Available. FINDINGS: Lower chest: Lung bases are clear. Hepatobiliary: Unenhanced liver is unremarkable. Gallbladder is unremarkable. No intrahepatic or extrahepatic duct dilatation. Pancreas: Within normal limits. Spleen: Within normal limits. Adrenals/Urinary Tract: Bilateral renal nodules, measuring 2.1 cm on the right and 3.8 cm on the left, both measuring less than 10 HUs and compatible with benign adrenal adenomas. No follow-up is recommended. Right kidney is notable for a 1.9 cm calculus in the right renal pelvis with mild periureteral stranding and mild pelvic fullness. 2 nonobstructing left lower pole renal calculi measuring up to 5 mm (series 2/image 41). Associated 11 mm proximal left ureteral calculus at the L4 level (series 2/image 43), without hydronephrosis. Bladder is within normal limits. Stomach/Bowel: Stomach is notable for a tiny hiatal hernia. No evidence of bowel obstruction. Normal appendix (series 2/image 70). No colonic wall thickening or inflammatory changes. Vascular/Lymphatic: No evidence of abdominal aortic aneurysm. Atherosclerotic calcifications of the abdominal aorta and branch vessels. No suspicious abdominopelvic lymphadenopathy. Reproductive: Uterus is within normal limits. Bilateral ovaries are within normal limits. Other:  No abdominopelvic ascites. Musculoskeletal: Visualized osseous structures are within normal limits. IMPRESSION: 1.9 cm calculus in the right renal pelvis with mild pelvic  fullness. 11 mm proximal left ureteral calculus at the L4 level, without hydronephrosis. Two nonobstructing left lower pole renal calculi measuring up to 5 mm. Additional ancillary findings as above. Electronically Signed   By: Julian Hy M.D.   On: 02/28/2022 20:58   DG Abdomen Acute W/Chest  Result Date: 02/28/2022 CLINICAL DATA:  cough, constipation EXAM: DG ABDOMEN ACUTE WITH 1 VIEW CHEST COMPARISON:  November 10, 2020 FINDINGS: Incomplete assessment of the lateral soft tissues. Evaluation is limited secondary to underpenetration. Air and stool filled nondilated loops of bowel throughout the abdomen. No definitive free air noted. AICD cardiac loop recorder. No pleural effusion or pneumothorax. LEFT basilar scarring/atelectasis. Atherosclerotic calcifications of the aorta. There is a calcific density projecting just lateral to the left L4 transverse process which measures 12 mm. IMPRESSION: 1. Nonobstructive bowel gas pattern. 2. 12 mm calcific density projecting just lateral to the left L4 transverse process. This could reflect a ureteral stone versus enteric debris. Recommend correlation with point tenderness. If concern for obstructing nephrolithiasis, recommend dedicated CT abdomen pelvis without contrast. 3. No acute cardiopulmonary abnormality. Electronically Signed   By: Valentino Saxon M.D.   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 Urology to continue to follow   LOS: 0 days   Nicolette Bang 03/02/2022, 11:20 AM

## 2022-03-02 NOTE — Progress Notes (Addendum)
Foley catheter removed per order. Patient informed to let us know when she has to urinate.  1230 patient urinated

## 2022-03-02 NOTE — Discharge Summary (Signed)
Physician Discharge Summary   Patient: Melanie Cordova MRN: [id number] DOB: [birth year]  Admit date:     02/28/2022  Discharge date: 03/02/22  Discharge Physician: Barton Dubois   PCP: Carrolyn Meiers, MD   Recommendations at discharge:  Repeat CBC to follow hemoglobin/WBCs trend and stability Repeat basic metabolic panel to follow electrolytes and renal function Make sure patient has follow-up with urology service as instructed Reassess blood pressure and adjust antihypertensive treatment as needed  Discharge Diagnoses: Principal Problem:   Nephrolithiasis Active Problems:   Hypertension   Hypothyroidism   Hyperlipidemia   COPD mixed type ([place])   Flank pain   Acute lower UTI   AKI (acute kidney injury) ([place])   Leukocytosis   Bilateral ureteral obstruction  Hospital Course: As per H&P written by Dr. Jani Gravel on 03/01/2022 Melanie Cordova is a 61 y.o. female with medical history significant of hypothyroidism, hypertension, hyperlipidemia, Chronic CHF (systolic), Hx of CVA, seizure do, Gerd, Tobacco dep, Copd, apparently presents with c/o bilateral flank pain x 4 days, along with bilateral inguinal discomfort and suprapubic discomfort, and dysuria, and subjective fever x 4days.   Pt denies cough, cp, palp, sob, diarrhea, brbpr, black stool, gross hematuria.   Assessment and Plan: 1-acute lower UTI -In the setting of bilateral obstructing uropathy -Status post bilateral J stent placement -Continue empirical treatment with the use of Levaquin and follow-up with urology service as instructed -Patient advised to maintain adequate hydration  2-acute kidney injury on chronic kidney disease a stage IIIa -In the setting of UTI and obstructive uropathy -Medications were renally adjusted, fluid resuscitation provided -At discharge renal function essentially back to baseline. -Patient advised to maintain adequate hydration. -Repeat basic metabolic panel follow-up visit  to assess electrolytes and renal function and stability.  3-chronic systolic and diastolic heart failure -Status post [employer] Jude AICD implantation -Most recent echo in March 2022 demonstrating ejection fraction 45 to 50% and grade 1 diastolic dysfunction -Condition is a stable and compensated -Continue the use of Entresto, Coreg and Lasix.  4-COPD -Resume home bronchodilator management -No wheezing or significant respiratory symptoms at time of discharge.  5-tobacco abuse -Cessation counseling provided  6-gastroesophageal flux disease -Continue PPI  7-hypothyroidism -Continue Synthroid  8-hyperlipidemia -Continue the use of statins -Heart healthy diet discussed with patient.  9-class I obesity -Low-calorie diet, portion control and increase physical activity discussed with patient -Body mass index is 32.6 kg/m.   Consultants: Neurology service Procedures performed: See below for x-ray report; bilateral ureteral J stent placement. Disposition: Home Diet recommendation: Heart healthy, low-sodium and low calorie diet.  DISCHARGE MEDICATION: Allergies as of 03/02/2022       Reactions   Fish Allergy Anaphylaxis   Iodinated Contrast Media Hives, Other (See Comments)   Pulmonary problems; no frank respiratory arrest   Iodine Hives, Other (See Comments)   Pulmonary Problems    Lentil Anaphylaxis   Penicillins Anaphylaxis, Shortness Of Breath   Has patient had a PCN reaction causing immediate rash, facial/tongue/throat swelling, SOB or lightheadedness with hypotension: Yes Has patient had a PCN reaction causing severe rash involving mucus membranes or skin necrosis: No Has patient had a PCN reaction that required hospitalization No Has patient had a PCN reaction occurring within the last 10 years: No If all of the above answers are "NO", then may proceed with Cephalosporin use. Hair loss   Lipitor [atorvastatin] Itching, Rash   Spironolactone Rash   Sulfa Antibiotics  Other (See Comments)   Unknown  Medication List     TAKE these medications    acetaminophen 650 MG CR tablet Commonly known as: TYLENOL Take 650 mg by mouth every 8 (eight) hours as needed for pain.   albuterol 108 (90 Base) MCG/ACT inhaler Commonly known as: VENTOLIN HFA Inhale 2 puffs into the lungs every 6 (six) hours as needed for wheezing or shortness of breath.   aspirin EC 81 MG tablet Take 1 tablet (81 mg total) by mouth daily.   Biotin 10000 MCG Tabs Take 10,000 mcg by mouth daily.   budesonide-formoterol 80-4.5 MCG/ACT inhaler Commonly known as: SYMBICORT Inhale 2 puffs into the lungs 2 (two) times daily.   calcium carbonate 500 MG chewable tablet Commonly known as: TUMS - dosed in mg elemental calcium Chew 1 tablet by mouth daily as needed for indigestion or heartburn.   carvedilol 12.5 MG tablet Commonly known as: COREG Take 25 mg by mouth 2 (two) times daily with a meal.   CENTRAVITES 50 PLUS PO Take 1 tablet by mouth daily.   cetirizine 10 MG tablet Commonly known as: ZYRTEC Take 10 mg by mouth daily.   clobetasol 0.05 % external solution Commonly known as: TEMOVATE Apply 1 application topically 2 (two) times daily as needed (Apply to scalp.).   clopidogrel 75 MG tablet Commonly known as: PLAVIX Take 1 tablet (75 mg total) by mouth daily.   cyclobenzaprine 5 MG tablet Commonly known as: FLEXERIL TAKE ONE TABLET BY MOUTH AT BEDTIME What changed: how much to take   Entresto 24-26 MG Generic drug: sacubitril-valsartan TAKE ONE TABLET BY MOUTH 2 TIMES A DAY   famotidine 20 MG tablet Commonly known as: PEPCID Take 20 mg by mouth daily as needed for heartburn or indigestion.   fluticasone 50 MCG/ACT nasal spray Commonly known as: FLONASE Place 2 sprays into both nostrils daily.   furosemide 40 MG tablet Commonly known as: LASIX Take 1 tablet (40 mg total) by mouth 2 (two) times daily.   Iron 28 MG Tabs Take 28 mg by mouth  daily.   ketoconazole 2 % cream Commonly known as: NIZORAL Apply 1 application topically 2 (two) times daily.   levofloxacin 750 MG tablet Commonly known as: Levaquin Take 1 tablet (750 mg total) by mouth daily for 8 days.   levothyroxine 200 MCG tablet Commonly known as: SYNTHROID Take 1 tablet (200 mcg total) by mouth daily before breakfast.   Linzess 145 MCG Caps capsule Generic drug: linaclotide Take 145 mcg by mouth daily.   pantoprazole 20 MG tablet Commonly known as: PROTONIX Take 1 tablet (20 mg total) by mouth daily.   potassium chloride SA 20 MEQ tablet Commonly known as: KLOR-CON M Take 20 mEq by mouth daily.   rosuvastatin 20 MG tablet Commonly known as: CRESTOR Take 20 mg by mouth daily.   topiramate 100 MG tablet Commonly known as: TOPAMAX TAKE ONE TABLET BY MOUTH AT BEDTIME What changed: when to take this   Ubrelvy 50 MG Tabs Generic drug: Ubrogepant Take 50 mg by mouth as needed. Can repeat a dose in 2 hours if headache persists   Vitamin D (Ergocalciferol) 1.25 MG (50000 UNIT) Caps capsule Commonly known as: DRISDOL Take 50,000 Units by mouth every 30 (thirty) days.        Follow-up Information     Fanta, Normajean Baxter, MD. Schedule an appointment as soon as possible for a visit in 2 week(s).   Specialty: Internal Medicine Contact information: [address] [place] [address] [phone]  Discharge Exam: Filed Weights   02/28/22 1613 03/01/22 1235 03/02/22 0500  Weight: 91.6 kg 91.6 kg 94.4 kg   General exam: Alert, awake, oriented x 3; in no acute distress.  Afebrile and feeling ready to go home. Respiratory system: Clear to auscultation. Respiratory effort normal. Cardiovascular system:Rate controlled. No murmurs, rubs, gallops. Gastrointestinal system: Abdomen is obese, nondistended, soft and nontender. No organomegaly or masses felt. Normal bowel sounds heard. Central nervous system: Alert and  oriented. No focal neurological deficits. Extremities: No C/C/E, +pedal pulses Skin: No petechiae. Psychiatry: Judgement and insight appear normal. Mood & affect appropriate.    Condition at discharge: Stable and improved.  The results of significant diagnostics from this hospitalization (including imaging, microbiology, ancillary and laboratory) are listed below for reference.   Imaging Studies: DG C-Arm 1-60 Min  Result Date: 03/01/2022 CLINICAL DATA:  Fluoro guidance provided EXAM: DG C-ARM 1-60 MIN FINDINGS: Dose: 12.2 mGy Fluoro time 30s IMPRESSION: C-arm fluoro guidance provided. Electronically Signed   By: Sammie Bench M.D.   On: 03/01/2022 14:02   CT ABDOMEN PELVIS WO CONTRAST  Result Date: 02/28/2022 CLINICAL DATA:  Flank pain, fever, constipation, decreased urinary output EXAM: CT ABDOMEN AND PELVIS WITHOUT CONTRAST TECHNIQUE: Multidetector CT imaging of the abdomen and pelvis was performed following the standard protocol without IV contrast. RADIATION DOSE REDUCTION: This exam was performed according to the departmental dose-optimization program which includes automated exposure control, adjustment of the mA and/or kV according to patient size and/or use of iterative reconstruction technique. COMPARISON:  None Available. FINDINGS: Lower chest: Lung bases are clear. Hepatobiliary: Unenhanced liver is unremarkable. Gallbladder is unremarkable. No intrahepatic or extrahepatic duct dilatation. Pancreas: Within normal limits. Spleen: Within normal limits. Adrenals/Urinary Tract: Bilateral renal nodules, measuring 2.1 cm on the right and 3.8 cm on the left, both measuring less than 10 HUs and compatible with benign adrenal adenomas. No follow-up is recommended. Right kidney is notable for a 1.9 cm calculus in the right renal pelvis with mild periureteral stranding and mild pelvic fullness. 2 nonobstructing left lower pole renal calculi measuring up to 5 mm (series 2/image 41). Associated  11 mm proximal left ureteral calculus at the L4 level (series 2/image 43), without hydronephrosis. Bladder is within normal limits. Stomach/Bowel: Stomach is notable for a tiny hiatal hernia. No evidence of bowel obstruction. Normal appendix (series 2/image 70). No colonic wall thickening or inflammatory changes. Vascular/Lymphatic: No evidence of abdominal aortic aneurysm. Atherosclerotic calcifications of the abdominal aorta and branch vessels. No suspicious abdominopelvic lymphadenopathy. Reproductive: Uterus is within normal limits. Bilateral ovaries are within normal limits. Other: No abdominopelvic ascites. Musculoskeletal: Visualized osseous structures are within normal limits. IMPRESSION: 1.9 cm calculus in the right renal pelvis with mild pelvic fullness. 11 mm proximal left ureteral calculus at the L4 level, without hydronephrosis. Two nonobstructing left lower pole renal calculi measuring up to 5 mm. Additional ancillary findings as above. Electronically Signed   By: Julian Hy M.D.   On: 02/28/2022 20:58   DG Abdomen Acute W/Chest  Result Date: 02/28/2022 CLINICAL DATA:  cough, constipation EXAM: DG ABDOMEN ACUTE WITH 1 VIEW CHEST COMPARISON:  November 10, 2020 FINDINGS: Incomplete assessment of the lateral soft tissues. Evaluation is limited secondary to underpenetration. Air and stool filled nondilated loops of bowel throughout the abdomen. No definitive free air noted. AICD cardiac loop recorder. No pleural effusion or pneumothorax. LEFT basilar scarring/atelectasis. Atherosclerotic calcifications of the aorta. There is a calcific density projecting just lateral to the left L4 transverse  process which measures 12 mm. IMPRESSION: 1. Nonobstructive bowel gas pattern. 2. 12 mm calcific density projecting just lateral to the left L4 transverse process. This could reflect a ureteral stone versus enteric debris. Recommend correlation with point tenderness. If concern for obstructing  nephrolithiasis, recommend dedicated CT abdomen pelvis without contrast. 3. No acute cardiopulmonary abnormality. Electronically Signed   By: Valentino Saxon M.D.   On: 02/28/2022 17:48    Microbiology: Results for orders placed or performed during the hospital encounter of 02/28/22  Urine Culture     Status: Abnormal (Preliminary result)   Collection Time: 02/28/22  4:15 PM   Specimen: Urine, Clean Catch  Result Value Ref Range Status   Specimen Description   Final    URINE, CLEAN CATCH Performed at [facility], [address] [place], [place] [address]    Special Requests   Final    NONE Performed at [facility], [address] [place], [place] [address]    Culture (A)  Final    >=100,000 COLONIES/mL ESCHERICHIA COLI 10,000 COLONIES/mL PROTEUS MIRABILIS SUSCEPTIBILITIES TO FOLLOW Performed at [facility] [address], [place], [place] [address]    Report Status PENDING  Incomplete  Resp panel by RT-PCR (RSV, Flu A&B, Covid) Anterior Nasal Swab     Status: None   Collection Time: 02/28/22  4:18 PM   Specimen: Anterior Nasal Swab  Result Value Ref Range Status   SARS Coronavirus 2 by RT PCR NEGATIVE NEGATIVE Final    Comment: (NOTE) SARS-CoV-2 target nucleic acids are NOT DETECTED.  The SARS-CoV-2 RNA is generally detectable in upper respiratory specimens during the acute phase of infection. The lowest concentration of SARS-CoV-2 viral copies this assay can detect is 138 copies/mL. A negative result does not preclude SARS-Cov-2 infection and should not be used as the sole basis for treatment or other patient management decisions. A negative result may occur with  improper specimen collection/handling, submission of specimen other than nasopharyngeal swab, presence of viral mutation(s) within the areas targeted by this assay, and inadequate number of viral copies(<138 copies/mL). A negative result must be combined with clinical observations, patient history, and  epidemiological information. The expected result is Negative.  Fact Sheet for Patients:  EntrepreneurPulse.com.au  Fact Sheet for Healthcare Providers:  IncredibleEmployment.be  This test is no t yet approved or cleared by the [place] FDA and  has been authorized for detection and/or diagnosis of SARS-CoV-2 by FDA under an Emergency Use Authorization (EUA). This EUA will remain  in effect (meaning this test can be used) for the duration of the COVID-19 declaration under Section 564(b)(1) of the Act, 21 U.S.C.section 360bbb-3(b)(1), unless the authorization is terminated  or revoked sooner.       Influenza A by PCR NEGATIVE NEGATIVE Final   Influenza B by PCR NEGATIVE NEGATIVE Final    Comment: (NOTE) The Xpert Xpress SARS-CoV-2/FLU/RSV plus assay is intended as an aid in the diagnosis of influenza from Nasopharyngeal swab specimens and should not be used as a sole basis for treatment. Nasal washings and aspirates are unacceptable for Xpert Xpress SARS-CoV-2/FLU/RSV testing.  Fact Sheet for Patients: EntrepreneurPulse.com.au  Fact Sheet for Healthcare Providers: IncredibleEmployment.be  This test is not yet approved or cleared by the [place] FDA and has been authorized for detection and/or diagnosis of SARS-CoV-2 by FDA under an Emergency Use Authorization (EUA). This EUA will remain in effect (meaning this test can be used) for the duration of the COVID-19 declaration under Section  564(b)(1) of the Act, 21 U.S.C. section 360bbb-3(b)(1), unless the authorization is terminated or revoked.     Resp Syncytial Virus by PCR NEGATIVE NEGATIVE Final    Comment: (NOTE) Fact Sheet for Patients: EntrepreneurPulse.com.au  Fact Sheet for Healthcare Providers: IncredibleEmployment.be  This test is not yet approved or cleared by the [place] FDA and has been  authorized for detection and/or diagnosis of SARS-CoV-2 by FDA under an Emergency Use Authorization (EUA). This EUA will remain in effect (meaning this test can be used) for the duration of the COVID-19 declaration under Section 564(b)(1) of the Act, 21 U.S.C. section 360bbb-3(b)(1), unless the authorization is terminated or revoked.  Performed at [facility], [address], [place], [place] [address]     Labs: CBC: Recent Labs  Lab 02/28/22 1741 03/01/22 0439 03/02/22 0419  WBC 14.9* 14.5* 12.0*  NEUTROABS 12.6*  --   --   HGB 13.9 12.0 11.7*  HCT 42.6 36.5 36.5  MCV 92.0 93.4 92.6  PLT 180 177 559   Basic Metabolic Panel: Recent Labs  Lab 02/28/22 1741 03/01/22 0439 03/02/22 0419  NA 137 138 141  K 4.1 3.5 3.5  CL 107 108 109  CO2 20* 21* 23  GLUCOSE 139* 121* 143*  BUN 29* 30* 24*  CREATININE 1.24* 1.31* 0.99  CALCIUM 9.3 8.4* 8.7*   Liver Function Tests: Recent Labs  Lab 02/28/22 1741  AST 20  ALT 23  ALKPHOS 90  BILITOT 0.9  PROT 6.9  ALBUMIN 3.3*   CBG: Recent Labs  Lab 03/01/22 1200  GLUCAP 109*    Discharge time spent: greater than 30 minutes.  Signed: Barton Dubois, MD [facility] 03/02/2022

## 2022-03-03 LAB — URINE CULTURE: Culture: >=100000 — AB

## 2022-03-04 NOTE — Anesthesia Postprocedure Evaluation (Signed)
Anesthesia Post Note  Patient: Melanie Cordova  Procedure(s) Performed: CYSTOSCOPY WITH RETROGRADE PYELOGRAM/URETERAL STENT PLACEMENT (Bilateral: Urethra)  Patient location during evaluation: Phase II Anesthesia Type: General Level of consciousness: awake Pain management: pain level controlled Vital Signs Assessment: post-procedure vital signs reviewed and stable Respiratory status: spontaneous breathing and respiratory function stable Cardiovascular status: blood pressure returned to baseline and stable Postop Assessment: no headache and no apparent nausea or vomiting Anesthetic complications: no Comments: Late entry   No notable events documented.   Last Vitals:  Vitals:   03/02/22 0734 03/02/22 0813  BP:  119/64  Pulse: 84 86  Resp: 16   Temp:    SpO2: 95%     Last Pain:  Vitals:   03/02/22 0900  TempSrc:   PainSc: 0-No pain                 Louann Sjogren

## 2022-03-10 ENCOUNTER — Encounter (HOSPITAL_COMMUNITY): Payer: Self-pay | Admitting: Urology

## 2022-03-15 ENCOUNTER — Ambulatory Visit (INDEPENDENT_AMBULATORY_CARE_PROVIDER_SITE_OTHER): Payer: Medicare Other | Admitting: Urology

## 2022-03-15 VITALS — BP 101/67 | HR 84

## 2022-03-15 DIAGNOSIS — N201 Calculus of ureter: Secondary | ICD-10-CM

## 2022-03-15 MED ORDER — OXYCODONE-ACETAMINOPHEN 5-325 MG PO TABS: 1.0000 | ORAL_TABLET | ORAL | 0 refills | Status: DC | PRN

## 2022-03-16 LAB — URINALYSIS, ROUTINE W REFLEX MICROSCOPIC
Bilirubin, UA: NEGATIVE
Glucose, UA: NEGATIVE
Ketones, UA: NEGATIVE
Nitrite, UA: NEGATIVE
Specific Gravity, UA: <1.005 — ABNORMAL LOW (ref 1.005–1.030)
Urobilinogen, Ur: 0.2 mg/dL (ref 0.2–1.0)
pH, UA: 5 (ref 5.0–7.5)

## 2022-03-16 LAB — MICROSCOPIC EXAMINATION: RBC, Urine: >30 /hpf — AB (ref 0–2)

## 2022-03-18 ENCOUNTER — Other Ambulatory Visit (HOSPITAL_COMMUNITY): Payer: Self-pay | Admitting: Gerontology

## 2022-03-18 DIAGNOSIS — Z87891 Personal history of nicotine dependence: Secondary | ICD-10-CM

## 2022-03-18 DIAGNOSIS — I739 Peripheral vascular disease, unspecified: Secondary | ICD-10-CM

## 2022-03-22 ENCOUNTER — Encounter: Payer: Self-pay | Admitting: Urology

## 2022-03-22 NOTE — Patient Instructions (Signed)
Ureteroscopy  Ureteroscopy is a procedure to check for and treat problems inside part of the urinary tract. In this procedure, a long rigid or flexible tube with a lens and light at the end (ureteroscope) is used to look at the inside of the kidneys and the ureters. The ureters are the tubes that carry urine from the kidneys to the bladder. The ureteroscope is inserted into one or both of the ureters. You may need this procedure if you have frequent urinary tract infections (UTIs), blood in your urine, or a stone in one or both of your ureters. A ureteroscopy can be done: To find the cause of urine blockage in a ureter and to evaluate other abnormalities inside the ureters or kidneys. To remove stones. To remove or treat growths of tissue (polyps), abnormal tissue, and some types of tumors. To remove a tissue sample and check it for disease under a microscope (biopsy). Tell a health care provider about: Any allergies you have. All medicines you are taking, including vitamins, herbs, eye drops, creams, and over-the-counter medicines. Any problems you or family members have had with anesthetic medicines. Any bleeding problems you have. Any surgeries you have had. Any medical conditions you have. Whether you are pregnant or may be pregnant. What are the risks? Your health care provider will talk with you about risks. These may include: Abdominal pain or a burning feeling or pain while urinating. Abnormal bleeding. A UTI. Allergic reactions to medicines. Scarring that narrows the ureter (stricture) or swelling. Creating a hole (perforation) in the ureter. Damage to other structures or organs, such as the part of your body that drains urine from your bladder (urethra), your bladder, or your uterus. What happens before the procedure? When to stop eating and drinking  8 hours before your procedure Stop eating most foods. Do not eat meat, fried foods, or fatty foods. Eat only light foods, such  as toast or crackers. All liquids are okay except energy drinks and alcohol. 6 hours before your procedure Stop eating. Drink only clear liquids, such as water, clear fruit juice, black coffee, plain tea, and sports drinks. Do not drink energy drinks or alcohol. 2 hours before your procedure Stop drinking all liquids. You may be allowed to take medicines with small sips of water. Medicines Ask your health care provider about: Changing or stopping your regular medicines. These include any diabetes medicines or blood thinners you take. Taking medicines such as aspirin and ibuprofen. These medicines can thin your blood. Do not take these medicines unless your health care provider tells you to. Taking over-the-counter medicines, vitamins, herbs, and supplements. General instructions Do not use any products that contain nicotine or tobacco for at least 4 weeks before the procedure. These products include cigarettes, chewing tobacco, and vaping devices, such as e-cigarettes. If you need help quitting, ask your health care provider. If you will be going home right after the procedure, plan to have a responsible adult: Take you home from the hospital or clinic. You will not be allowed to drive. Care for you for the time you are told. Ask your health care provider what steps will be taken to help prevent infection. These may include: Washing skin with a soap that kills germs. Receiving antibiotic medicine. Tests You may have an exam or testing. You may have a urine sample taken to check for infection. What happens during the procedure? An IV will be inserted into one of your veins. You may be given: A sedative. This helps   you relax. Anesthesia. This will: Numb certain areas of your body. Make you fall asleep for surgery. Your urethra will be cleaned with a germ-killing solution. The ureteroscope will be passed through your urethra into your bladder. A salt-water solution will be sent through  the ureteroscope to fill your bladder. This will help the health care provider see the openings of your ureters more clearly. The ureteroscope will be passed into your ureter. If a growth is found, a biopsy may be done. If a stone is found, it may be removed through the ureteroscope, or the stone may be broken up using a laser, shock waves, or electrical energy. In some cases, if the ureter is too small, a tube may be inserted that keeps the ureter open (ureteral stent). The stent may be left in place for 1 or 2 weeks, and then the ureteroscopy procedure will be done again. The scope will be removed, and your bladder will be emptied. The procedure may vary among health care providers and hospitals. What happens after the procedure? Your blood pressure, heart rate, breathing rate, and blood oxygen level will be monitored until you leave the hospital or clinic. It is up to you to get the results of your procedure. Ask your health care provider, or the department that is doing the procedure, when your results will be ready. Summary Ureteroscopy is a procedure used to look at the inside of the kidneys and the ureters. You may need this procedure if you have frequent urinary tract infections (UTIs), blood in your urine, or a stone in one or both of your ureters. Follow instructions from your health care provider about eating and drinking. In some cases, if the ureter is too small, a tube may be inserted that keeps the ureter open (ureteral stent). The stent may be left in place for 1 or 2 weeks to keep the ureter open, and then the ureteroscopy procedure will be done again. This information is not intended to replace advice given to you by your health care provider. Make sure you discuss any questions you have with your health care provider. Document Revised: 06/25/2021 Document Reviewed: 06/25/2021 Elsevier Patient Education  2023 [employer]  

## 2022-03-22 NOTE — Progress Notes (Signed)
03/15/2022 8:13 AM   Melanie Cordova Oct 19, 1960 [id number]  Referring provider: Carrolyn Meiers, MD [place],  [place] [address]  Followup nephrolithiasis   HPI: Melanie Cordova is a 62yo here for followup for nephrolithiasis. She underwent bilateral ureteral stent placement 3 weeks ago for bilateral UPJ calculi and sepsis. She has mild bilateral flank pain with the stent in place. She has urinary urgency and frequency but no dysuria or gross hematuria. NO other complaints today   PMH: Past Medical History:  Diagnosis Date   Anxiety    Arthritis    Asthma    Automatic implantable cardioverter-defibrillator in situ    2013, july   Bell palsy    states has had 3 episodes   Cardiomyopathy 08/2010   Presented with congestive heart failure; EF of 15% and 2012; hypotension on medication precludes optimal dosing   CHF (congestive heart failure) ([place])    a. EF 20% in 2012 with low-risk NST --> St. Jude ICD implantation by Dr. Lovena Le in 09/2011 b. NST in 12/2017 showing evidence of prior infarct without ischemia c. EF at 45-50% by echo in 58/5277   Chronic systolic heart failure ([place])    COPD (chronic obstructive pulmonary disease) ([place])    2013   Dysrhythmia    Gastroesophageal reflux disease    Headache(784.0)    Hot flashes 10/04/2016   Hyperlipidemia    Hypertension    09/2010-normal CMet and CBC; Lipid profile-116, 88, 25, 73   Hypothyroidism    Recent TSH was normal.   ICD (implantable cardiac defibrillator) in place 10/10/2011   Neuromuscular disorder (HCC)    Neuropathy, right foot and leg   Neuropathy    Obesity    Pneumonia    Seizures ([place])    last one at age 5   Shortness of breath    Stroke ([place])     stroke in 07/2012 and another in June, 2014   Tobacco abuse    20 pack years    Surgical History: Past Surgical History:  Procedure Laterality Date   CESAREAN SECTION     X2   COLONOSCOPY N/A 12/16/2013   One simple adenoma and  3 hyperplastic polyps. Small internal hemorrhoids. Left colon redundant. Due for surveillance 2020.    COLONOSCOPY WITH PROPOFOL N/A 06/04/2019   Procedure: COLONOSCOPY WITH PROPOFOL;  Surgeon: Danie Binder, MD;  [facility];  Service: Endoscopy;  Laterality: N/A;  10:30am   CYSTOSCOPY W/ URETERAL STENT PLACEMENT Bilateral 03/01/2022   Procedure: CYSTOSCOPY WITH RETROGRADE PYELOGRAM/URETERAL STENT PLACEMENT;  Surgeon: Cleon Gustin, MD;  Location: [facility];  Service: Urology;  Laterality: Bilateral;   EP IMPLANTABLE DEVICE     St. Jude   EP IMPLANTABLE DEVICE N/A 08/13/2015   Procedure: Loop Recorder Insertion;  Surgeon: Evans Lance, MD;  Location: [facility];  Service: Cardiovascular;  Laterality: N/A;   IMPLANTABLE CARDIOVERTER DEFIBRILLATOR IMPLANT N/A 10/10/2011   Procedure: IMPLANTABLE CARDIOVERTER DEFIBRILLATOR IMPLANT;  Surgeon: Evans Lance, MD;  Location: [facility];  Service: Cardiovascular;  Laterality: N/A;   MULTIPLE EXTRACTIONS WITH ALVEOLOPLASTY N/A 09/24/2012   Procedure: MULTIPLE EXTRACION #2, 4, 6, 7 ,8, 9, 11, 13, 18, 20, 21, 22, 23, 24, 25, 26, 27, 29 WITH ALVEOLOPLASTY, BIOPSY OF PALATE LESION, REMOVA RIGHT LINGUAL TORUS;  Surgeon: Gae Bon, DDS;  Location: [facility];  Service: Oral Surgery;  Laterality: N/A;   POLYPECTOMY  06/04/2019   Procedure: POLYPECTOMY;  Surgeon:  Danie Binder, MD;  [facility];  Service: Endoscopy;;   TEE WITHOUT CARDIOVERSION N/A 07/11/2012   Procedure: TRANSESOPHAGEAL ECHOCARDIOGRAM (TEE);  Surgeon: Thayer Headings, MD;  Location: [facility];  Service: Cardiovascular;  Laterality: N/A;   TUBAL LIGATION      Home Medications:  Allergies as of 03/15/2022       Reactions   Fish Allergy Anaphylaxis   Iodinated Contrast Media Hives, Other (See Comments)   Pulmonary problems; no frank respiratory arrest   Iodine Hives, Other (See Comments)   Pulmonary Problems    Lentil Anaphylaxis   Penicillins  Anaphylaxis, Shortness Of Breath   Has patient had a PCN reaction causing immediate rash, facial/tongue/throat swelling, SOB or lightheadedness with hypotension: Yes Has patient had a PCN reaction causing severe rash involving mucus membranes or skin necrosis: No Has patient had a PCN reaction that required hospitalization No Has patient had a PCN reaction occurring within the last 10 years: No If all of the above answers are "NO", then may proceed with Cephalosporin use. Hair loss   Lipitor [atorvastatin] Itching, Rash   Spironolactone Rash   Sulfa Antibiotics Other (See Comments)   Unknown        Medication List        Accurate as of March 15, 2022 11:59 PM. If you have any questions, ask your nurse or doctor.          acetaminophen 650 MG CR tablet Commonly known as: TYLENOL Take 650 mg by mouth every 8 (eight) hours as needed for pain.   albuterol 108 (90 Base) MCG/ACT inhaler Commonly known as: VENTOLIN HFA Inhale 2 puffs into the lungs every 6 (six) hours as needed for wheezing or shortness of breath.   aspirin EC 81 MG tablet Take 1 tablet (81 mg total) by mouth daily.   Biotin 10000 MCG Tabs Take 10,000 mcg by mouth daily.   budesonide-formoterol 80-4.5 MCG/ACT inhaler Commonly known as: SYMBICORT Inhale 2 puffs into the lungs 2 (two) times daily.   calcium carbonate 500 MG chewable tablet Commonly known as: TUMS - dosed in mg elemental calcium Chew 1 tablet by mouth daily as needed for indigestion or heartburn.   carvedilol 12.5 MG tablet Commonly known as: COREG Take 25 mg by mouth 2 (two) times daily with a meal.   CENTRAVITES 50 PLUS PO Take 1 tablet by mouth daily.   cetirizine 10 MG tablet Commonly known as: ZYRTEC Take 10 mg by mouth daily.   clobetasol 0.05 % external solution Commonly known as: TEMOVATE Apply 1 application topically 2 (two) times daily as needed (Apply to scalp.).   clopidogrel 75 MG tablet Commonly known as:  PLAVIX Take 1 tablet (75 mg total) by mouth daily.   cyclobenzaprine 5 MG tablet Commonly known as: FLEXERIL TAKE ONE TABLET BY MOUTH AT BEDTIME What changed: how much to take   Entresto 24-26 MG Generic drug: sacubitril-valsartan TAKE ONE TABLET BY MOUTH 2 TIMES A DAY   famotidine 20 MG tablet Commonly known as: PEPCID Take 20 mg by mouth daily as needed for heartburn or indigestion.   fluticasone 50 MCG/ACT nasal spray Commonly known as: FLONASE Place 2 sprays into both nostrils daily.   furosemide 40 MG tablet Commonly known as: LASIX Take 1 tablet (40 mg total) by mouth 2 (two) times daily.   Iron 28 MG Tabs Take 28 mg by mouth daily.   ketoconazole 2 % cream Commonly known as: NIZORAL Apply 1 application topically  2 (two) times daily.   levothyroxine 200 MCG tablet Commonly known as: SYNTHROID Take 1 tablet (200 mcg total) by mouth daily before breakfast.   Linzess 145 MCG Caps capsule Generic drug: linaclotide Take 145 mcg by mouth daily.   oxyCODONE-acetaminophen 5-325 MG tablet Commonly known as: Percocet Take 1 tablet by mouth every 4 (four) hours as needed.   pantoprazole 20 MG tablet Commonly known as: PROTONIX Take 1 tablet (20 mg total) by mouth daily.   potassium chloride SA 20 MEQ tablet Commonly known as: KLOR-CON M Take 20 mEq by mouth daily.   rosuvastatin 20 MG tablet Commonly known as: CRESTOR Take 20 mg by mouth daily.   topiramate 100 MG tablet Commonly known as: TOPAMAX TAKE ONE TABLET BY MOUTH AT BEDTIME What changed: when to take this   Ubrelvy 50 MG Tabs Generic drug: Ubrogepant Take 50 mg by mouth as needed. Can repeat a dose in 2 hours if headache persists   Vitamin D (Ergocalciferol) 1.25 MG (50000 UNIT) Caps capsule Commonly known as: DRISDOL Take 50,000 Units by mouth every 30 (thirty) days.        Allergies:  Allergies  Allergen Reactions   Fish Allergy Anaphylaxis   Iodinated Contrast Media Hives and Other  (See Comments)    Pulmonary problems; no frank respiratory arrest   Iodine Hives and Other (See Comments)    Pulmonary Problems    Lentil Anaphylaxis   Penicillins Anaphylaxis and Shortness Of Breath    Has patient had a PCN reaction causing immediate rash, facial/tongue/throat swelling, SOB or lightheadedness with hypotension: Yes Has patient had a PCN reaction causing severe rash involving mucus membranes or skin necrosis: No Has patient had a PCN reaction that required hospitalization No Has patient had a PCN reaction occurring within the last 10 years: No If all of the above answers are "NO", then may proceed with Cephalosporin use.  Hair loss   Lipitor [Atorvastatin] Itching and Rash   Spironolactone Rash   Sulfa Antibiotics Other (See Comments)    Unknown    Family History: Family History  Problem Relation Age of Onset   Cardiomyopathy Mother        ICD pacemaker-ischmic CM   Heart failure Mother    Hypertension Mother    Diabetes Mother    Thyroid disease Mother    Cardiomyopathy Father        Deceased   Coronary artery disease Father    Heart failure Father    Heart failure Brother    Hypertension Brother    Endometriosis Sister    Heart disease Paternal Grandfather    Heart disease Paternal Grandmother    Heart disease Maternal Grandmother    Heart disease Maternal Grandfather    Hypertension Daughter    Obesity Daughter    Colon cancer Neg Hx    Liver disease Neg Hx     Social History:  reports that she has been smoking cigarettes. She started smoking about 38 years ago. She has a 15.75 pack-year smoking history. She has never used smokeless tobacco. She reports that she does not drink alcohol and does not use drugs.  ROS: All other review of systems were reviewed and are negative except what is noted above in HPI  Physical Exam: BP 101/67   Pulse 84   Constitutional:  Alert and oriented, No acute distress. HEENT: [place] AT, moist mucus membranes.  Trachea  midline, no masses. Cardiovascular: No clubbing, cyanosis, or edema. Respiratory: Normal respiratory effort, no  increased work of breathing. GI: Abdomen is soft, nontender, nondistended, no abdominal masses GU: No CVA tenderness.  Lymph: No cervical or inguinal lymphadenopathy. Skin: No rashes, bruises or suspicious lesions. Neurologic: Grossly intact, no focal deficits, moving all 4 extremities. Psychiatric: Normal mood and affect.  Laboratory Data: Lab Results  Component Value Date   WBC 12.0 (H) 03/02/2022   HGB 11.7 (L) 03/02/2022   HCT 36.5 03/02/2022   MCV 92.6 03/02/2022   PLT 183 03/02/2022    Lab Results  Component Value Date   CREATININE 0.99 03/02/2022    No results found for: "PSA"  No results found for: "TESTOSTERONE"  Lab Results  Component Value Date   HGBA1C 5.7 (H) 07/03/2019    Urinalysis    Component Value Date/Time   COLORURINE AMBER (A) 02/28/2022 1615   APPEARANCEUR Cloudy (A) 03/15/2022 1522   LABSPEC 1.013 02/28/2022 1615   PHURINE 7.0 02/28/2022 1615   GLUCOSEU Negative 03/15/2022 1522   HGBUR MODERATE (A) 02/28/2022 1615   BILIRUBINUR Negative 03/15/2022 1522   KETONESUR NEGATIVE 02/28/2022 1615   PROTEINUR Trace 03/15/2022 1522   PROTEINUR >=300 (A) 02/28/2022 1615   UROBILINOGEN 0.2 04/29/2013 1702   NITRITE Negative 03/15/2022 1522   NITRITE POSITIVE (A) 02/28/2022 1615   LEUKOCYTESUR 2+ (A) 03/15/2022 1522   LEUKOCYTESUR LARGE (A) 02/28/2022 1615    Lab Results  Component Value Date   LABMICR See below: 03/15/2022   WBCUA 6-10 (A) 03/15/2022   LABEPIT 0-10 03/15/2022   BACTERIA Few 03/15/2022    Pertinent Imaging: CT 02/28/2022: Images reviewed and discussed with the patient  No results found for this or any previous visit.  No results found for this or any previous visit.  No results found for this or any previous visit.  No results found for this or any previous visit.  No results found for this or any previous  visit.  No valid procedures specified. No results found for this or any previous visit.  No results found for this or any previous visit.   Assessment & Plan:    1. Bilateral ureteral calculi -We discussed the management of kidney stones. These options include observation, ureteroscopy, shockwave lithotripsy (ESWL) and percutaneous nephrolithotomy (PCNL). We discussed which options are relevant to the patient's stone(s). We discussed the natural history of kidney stones as well as the complications of untreated stones and the impact on quality of life without treatment as well as with each of the above listed treatments. We also discussed the efficacy of each treatment in its ability to clear the stone burden. With any of these management options I discussed the signs and symptoms of infection and the need for emergent treatment should these be experienced. For each option we discussed the ability of each procedure to clear the patient of their stone burden.   For observation I described the risks which include but are not limited to silent renal damage, life-threatening infection, need for emergent surgery, failure to pass stone and pain.   For ureteroscopy I described the risks which include bleeding, infection, damage to contiguous structures, positioning injury, ureteral stricture, ureteral avulsion, ureteral injury, need for prolonged ureteral stent, inability to perform ureteroscopy, need for an interval procedure, inability to clear stone burden, stent discomfort/pain, heart attack, stroke, pulmonary embolus and the inherent risks with general anesthesia.   For shockwave lithotripsy I described the risks which include arrhythmia, kidney contusion, kidney hemorrhage, need for transfusion, pain, inability to adequately break up stone, inability to pass  stone fragments, [place], infection associated with obstructing stones, need for alternate surgical procedure, need for repeat shockwave  lithotripsy, MI, CVA, PE and the inherent risks with anesthesia/conscious sedation.   For PCNL I described the risks including positioning injury, pneumothorax, hydrothorax, need for chest tube, inability to clear stone burden, renal laceration, arterial venous fistula or malformation, need for embolization of kidney, loss of kidney or renal function, need for repeat procedure, need for prolonged nephrostomy tube, ureteral avulsion, MI, CVA, PE and the inherent risks of general anesthesia.   - The patient would like to proceed with left ureteroscopic stone extraction - Urinalysis, Routine w reflex microscopic   No follow-ups on file.  Nicolette Bang, MD  [facility] [place]

## 2022-03-23 ENCOUNTER — Telehealth: Payer: Self-pay

## 2022-03-23 NOTE — Telephone Encounter (Signed)
I called the patient to discuss surgery- no answer. Left message return call to office.

## 2022-03-25 ENCOUNTER — Ambulatory Visit (HOSPITAL_COMMUNITY)
Admission: RE | Admit: 2022-03-25 | Discharge: 2022-03-25 | Disposition: A | Discharge status: Home / Self Care | Payer: 59 | Source: Ambulatory Visit | Attending: Gerontology | Admitting: Gerontology

## 2022-03-25 ENCOUNTER — Encounter (HOSPITAL_COMMUNITY): Payer: Self-pay

## 2022-03-25 DIAGNOSIS — I739 Peripheral vascular disease, unspecified: Secondary | ICD-10-CM | POA: Diagnosis present

## 2022-03-25 DIAGNOSIS — Z87891 Personal history of nicotine dependence: Secondary | ICD-10-CM | POA: Diagnosis not present

## 2022-03-28 ENCOUNTER — Telehealth: Payer: Self-pay

## 2022-03-28 NOTE — Telephone Encounter (Signed)
Follow up appointment schedule with Tommye Standard, PA; patient agreeable and voiced understanding.

## 2022-03-28 NOTE — Telephone Encounter (Signed)
I spoke with Melanie Cordova. We have discussed possible surgery dates and 04/14/2022 was agreed upon by all parties. Patient given information about surgery date, what to expect pre-operatively and post operatively.    We discussed that a pre-op nurse will be calling to set up the pre-op visit that will take place prior to surgery. Informed patient that our office will communicate any additional care to be provided after surgery.    Patients questions or concerns were discussed during our call. Advised to call our office should there be any additional information, questions or concerns that arise. Patient verbalized understanding.    Pending clearance for Plavix hold from Cardiology. I discussed with patient I would reach back out once clearance is approved. Patient voiced understanding.  Instructions mailed to patient as well.

## 2022-03-28 NOTE — Telephone Encounter (Signed)
   Name: [name]  DOB: 01-19-1961  MRN: [id number]  Primary Cardiologist: [name], MD  Chart reviewed as part of pre-operative protocol coverage. Because of Melanie Cordova's past medical history and time since last visit, she will require a follow-up in-office visit in order to better assess preoperative cardiovascular risk.  Pre-op covering staff: - Please schedule appointment and call patient to inform them. If patient already had an upcoming appointment within acceptable timeframe, please add "pre-op clearance" to the appointment notes so provider is aware. - Please contact requesting surgeon's office via preferred method (i.e, phone, fax) to inform them of need for appointment prior to surgery.  Plan is for holding Plavix should come from neurology  Mable Fill, Marissa Nestle, NP  03/28/2022, 1:05 PM

## 2022-03-28 NOTE — Telephone Encounter (Signed)
   Pre-operative Risk Assessment    Patient Name: Melanie Cordova  DOB: 1960-10-10 MRN: [id number]      Request for Surgical Clearance    Procedure:   Cystoscopy with Left Retrograde Pyelogram Left Ureteroscopy with Laser Left Ureteral Stent Placement   Date of Surgery:  Clearance 04/14/22                                 Surgeon:  Dr Nicolette Bang Surgeon's Group or Practice Name:  [facility] [place] Phone number:  [phone] Fax number:  [phone]   Type of Clearance Requested:   - Medical  - Pharmacy:  Hold Clopidogrel (Plavix) 5 days prior; is not requesting to hold ASA   Type of Anesthesia:  General    Additional requests/questions:   N/A  Job Founds T   03/28/2022, 11:44 AM

## 2022-04-10 NOTE — Progress Notes (Unsigned)
Cardiology Office Note Date:  04/10/2022  Patient ID:  Melanie Cordova, Melanie Cordova 1960/10/09, MRN [id number] PCP:  Carrolyn Meiers, MD  Electrophysiologist: Dr. Lovena Le  ***refresh   Chief Complaint: *** pre-op evaluation  History of Present Illness: Melanie Cordova is a 62 y.o. female with history of morbid obesity, NICM, chronic CHF, ICD, COPD, HTN, HLD, hypothyroidism, stroke, seizure d/o.  She last saw Dr. Lovena Le 12/01/20, she was doing well, had lost a significant amount of weight with dietary changes, last EF 45-50% with class I symptoms. Discussed surveillance echo in a year  Hospitalized 02/28/22 with flank pain , dysuria, fever, found with lower UTI in setting of b/l obstructing uropathy, underwent bilateral J stent placement. Not found volume OL, discharged on home cardiac meds  Pending cystoscopy w/ Left Retrograde Pyelogram Left Ureteroscopy with Laser Left Ureteral Stent Placement   Scheduled 05/05/22 Requiring cardiac evaluation pre-op  TTE 2023 40-45%, mild LVH, DD I, RV OK  RCRI score is 2 >  6.6%  *** symptoms *** [facility] *** meds, CM *** needs better f/u, consider gen cards  Device information Abbott single chamber ICD implanted 10/10/2011   Past Medical History:  Diagnosis Date   Anxiety    Arthritis    Asthma    Automatic implantable cardioverter-defibrillator in situ    2013, july   Bell palsy    states has had 3 episodes   Cardiomyopathy 08/2010   Presented with congestive heart failure; EF of 15% and 2012; hypotension on medication precludes optimal dosing   Chest pain 12/28/2017   CHF (congestive heart failure) ([place])    a. EF 20% in 2012 with low-risk NST --> St. Jude ICD implantation by Dr. Lovena Le in 09/2011 b. NST in 12/2017 showing evidence of prior infarct without ischemia c. EF at 45-50% by echo in 25/6389   Chronic systolic heart failure (HCC)    COPD (chronic obstructive pulmonary disease) ([place])    2013   Dysrhythmia     Gastroesophageal reflux disease    Headache(784.0)    Hot flashes 10/04/2016   Hyperlipidemia    Hypertension    09/2010-normal CMet and CBC; Lipid profile-116, 88, 25, 73   Hypothyroidism    Recent TSH was normal.   ICD (implantable cardiac defibrillator) in place 10/10/2011   Neuromuscular disorder (HCC)    Neuropathy, right foot and leg   Neuropathy    Obesity    Pneumonia    Seizures ([place])    last one at age 60   Shortness of breath    Stroke ([place])     stroke in 07/2012 and another in June, 2014   Tobacco abuse    20 pack years    Past Surgical History:  Procedure Laterality Date   CESAREAN SECTION     X2   COLONOSCOPY N/A 12/16/2013   One simple adenoma and 3 hyperplastic polyps. Small internal hemorrhoids. Left colon redundant. Due for surveillance 2020.    COLONOSCOPY WITH PROPOFOL N/A 06/04/2019   Procedure: COLONOSCOPY WITH PROPOFOL;  Surgeon: Danie Binder, MD;  [facility];  Service: Endoscopy;  Laterality: N/A;  10:30am   CYSTOSCOPY W/ URETERAL STENT PLACEMENT Bilateral 03/01/2022   Procedure: CYSTOSCOPY WITH RETROGRADE PYELOGRAM/URETERAL STENT PLACEMENT;  Surgeon: Cleon Gustin, MD;  Location: [facility];  Service: Urology;  Laterality: Bilateral;   EP IMPLANTABLE DEVICE     St. Jude   EP IMPLANTABLE DEVICE N/A 08/13/2015   Procedure: Loop Recorder Insertion;  Surgeon: Evans Lance, MD;  Location: [place] [facility];  Service: Cardiovascular;  Laterality: N/A;   IMPLANTABLE CARDIOVERTER DEFIBRILLATOR IMPLANT N/A 10/10/2011   Procedure: IMPLANTABLE CARDIOVERTER DEFIBRILLATOR IMPLANT;  Surgeon: Evans Lance, MD;  Location: [facility] CATH LAB;  Service: Cardiovascular;  Laterality: N/A;   MULTIPLE EXTRACTIONS WITH ALVEOLOPLASTY N/A 09/24/2012   Procedure: MULTIPLE EXTRACION #2, 4, 6, 7 ,8, 9, 11, 13, 18, 20, 21, 22, 23, 24, 25, 26, 27, 29 WITH ALVEOLOPLASTY, BIOPSY OF PALATE LESION, REMOVA RIGHT LINGUAL TORUS;  Surgeon: Gae Bon, DDS;  Location: [place];   Service: Oral Surgery;  Laterality: N/A;   POLYPECTOMY  06/04/2019   Procedure: POLYPECTOMY;  Surgeon: Danie Binder, MD;  [facility]: [facility];  Service: Endoscopy;;   TEE WITHOUT CARDIOVERSION N/A 07/11/2012   Procedure: TRANSESOPHAGEAL ECHOCARDIOGRAM (TEE);  Surgeon: Thayer Headings, MD;  Location: [facility] ENDOSCOPY;  Service: Cardiovascular;  Laterality: N/A;   TUBAL LIGATION      Current Outpatient Medications  Medication Sig Dispense Refill   acetaminophen (TYLENOL) 650 MG CR tablet Take 650 mg by mouth every 8 (eight) hours as needed for pain.     albuterol (PROVENTIL HFA;VENTOLIN HFA) 108 (90 BASE) MCG/ACT inhaler Inhale 2 puffs into the lungs every 6 (six) hours as needed for wheezing or shortness of breath. 1 Inhaler 5   aspirin EC 81 MG tablet Take 1 tablet (81 mg total) by mouth daily. 21 tablet 0   Biotin 10000 MCG TABS Take 10,000 mcg by mouth daily.      budesonide-formoterol (SYMBICORT) 80-4.5 MCG/ACT inhaler Inhale 2 puffs into the lungs 2 (two) times daily.     calcium carbonate (TUMS - DOSED IN MG ELEMENTAL CALCIUM) 500 MG chewable tablet Chew 1 tablet by mouth daily as needed for indigestion or heartburn.     carvedilol (COREG) 12.5 MG tablet Take 25 mg by mouth 2 (two) times daily with a meal.     cetirizine (ZYRTEC) 10 MG tablet Take 10 mg by mouth daily.     clobetasol (TEMOVATE) 0.05 % external solution Apply 1 application topically 2 (two) times daily as needed (Apply to scalp.).      clopidogrel (PLAVIX) 75 MG tablet Take 1 tablet (75 mg total) by mouth daily. 21 tablet 0   cyclobenzaprine (FLEXERIL) 5 MG tablet TAKE ONE TABLET BY MOUTH AT BEDTIME (Patient taking differently: Take 10 mg by mouth at bedtime.) 30 tablet 1   ENTRESTO 24-26 MG TAKE ONE TABLET BY MOUTH 2 TIMES A DAY (Patient taking differently: Take 1 tablet by mouth 2 (two) times daily.) 60 tablet 0   famotidine (PEPCID) 20 MG tablet Take 20 mg by mouth daily as needed for heartburn or indigestion.      Ferrous Sulfate (IRON) 28 MG TABS Take 28 mg by mouth daily.      fluticasone (FLONASE) 50 MCG/ACT nasal spray Place 2 sprays into both nostrils daily.     furosemide (LASIX) 40 MG tablet Take 1 tablet (40 mg total) by mouth 2 (two) times daily. 180 tablet 0   ketoconazole (NIZORAL) 2 % cream Apply 1 application topically 2 (two) times daily.      levothyroxine (SYNTHROID, LEVOTHROID) 200 MCG tablet Take 1 tablet (200 mcg total) by mouth daily before breakfast. 90 tablet 2   LINZESS 145 MCG CAPS capsule Take 145 mcg by mouth daily.     Multiple Vitamins-Minerals (CENTRAVITES 50 PLUS PO) Take 1 tablet by mouth daily.  oxyCODONE-acetaminophen (PERCOCET) 5-325 MG tablet Take 1 tablet by mouth every 4 (four) hours as needed. 30 tablet 0   pantoprazole (PROTONIX) 20 MG tablet Take 1 tablet (20 mg total) by mouth daily. 90 tablet 1   potassium chloride SA (K-DUR) 20 MEQ tablet Take 20 mEq by mouth daily.      rosuvastatin (CRESTOR) 20 MG tablet Take 20 mg by mouth daily.     topiramate (TOPAMAX) 100 MG tablet TAKE ONE TABLET BY MOUTH AT BEDTIME (Patient taking differently: Take 100 mg by mouth daily.) 90 tablet 2   Ubrogepant (UBRELVY) 50 MG TABS Take 50 mg by mouth as needed. Can repeat a dose in 2 hours if headache persists 16 tablet 2   Vitamin D, Ergocalciferol, (DRISDOL) 1.25 MG (50000 UNIT) CAPS capsule Take 50,000 Units by mouth every 30 (thirty) days.     No current facility-administered medications for this visit.    Allergies:   Fish allergy, Iodinated contrast media, Iodine, Lentil, Penicillins, Lipitor [atorvastatin], Spironolactone, and Sulfa antibiotics   Social History:  The patient  reports that she has been smoking cigarettes. She started smoking about 38 years ago. She has a 15.75 pack-year smoking history. She has never used smokeless tobacco. She reports that she does not drink alcohol and does not use drugs.   Family History:  The patient's family history includes  Cardiomyopathy in her father and mother; Coronary artery disease in her father; Diabetes in her mother; Endometriosis in her sister; Heart disease in her maternal grandfather, maternal grandmother, paternal grandfather, and paternal grandmother; Heart failure in her brother, father, and mother; Hypertension in her brother, daughter, and mother; Obesity in her daughter; Thyroid disease in her mother.  ROS:  Please see the history of present illness.    All other systems are reviewed and otherwise negative.   PHYSICAL EXAM:  VS:  There were no vitals taken for this visit. BMI: There is no height or weight on file to calculate BMI. Well nourished, well developed, in no acute distress HEENT: normocephalic, atraumatic Neck: no JVD, carotid bruits or masses Cardiac:  *** RRR; no significant murmurs, no rubs, or gallops Lungs:  *** CTA b/l, no wheezing, rhonchi or rales Abd: soft, nontender MS: no deformity or *** atrophy Ext: *** no edema Skin: warm and dry, no rash Neuro:  No gross deficits appreciated Psych: euthymic mood, full affect  *** ICD site is stable, no tethering or discomfort   EKG:  Done today and reviewed by myself shows  ***  Device interrogation done today and reviewed by myself:  ***   06/07/21: TTE 1. Left ventricular ejection fraction, by estimation, is 40 to 45%. The  left ventricle has mildly decreased function. The left ventricle  demonstrates global hypokinesis. The left ventricular internal cavity size  was mildly to moderately dilated. There  is mild left ventricular hypertrophy. Left ventricular diastolic  parameters are consistent with Grade I diastolic dysfunction (impaired  relaxation).   2. Right ventricular systolic function is normal. The right ventricular  size is normal.   3. Left atrial size was mildly dilated.   4. The mitral valve is normal in structure. Mild mitral valve  regurgitation. No evidence of mitral stenosis.   5. The aortic valve is  normal in structure. Aortic valve regurgitation is  not visualized. No aortic stenosis is present.   6. The inferior vena cava is normal in size with greater than 50%  respiratory variability, suggesting right atrial pressure of 3 mmHg.  Comparison(s): No significant change from prior study. Prior images  reviewed side by side.    Recent Labs: 02/28/2022: ALT 23 03/02/2022: BUN 24; Creatinine, Ser 0.99; Hemoglobin 11.7; Platelets 183; Potassium 3.5; Sodium 141  No results found for requested labs within last 365 days.   CrCl cannot be calculated (Patient's most recent lab result is older than the maximum 21 days allowed.).   Wt Readings from Last 3 Encounters:  03/02/22 208 lb 1.8 oz (94.4 kg)  02/11/21 236 lb (107 kg)  12/01/20 246 lb (111.6 kg)     Other studies reviewed: Additional studies/records reviewed today include: summarized above  ASSESSMENT AND PLAN:  ICD ***  NICM Chronic CHF (systolic) *** On ***  HTN ***  Pre-op  Low cardiac risk surgery Perioperative risk of major cardiac event is 6.6% ***   Disposition: F/u with ***  Current medicines are reviewed at length with the patient today.  The patient did not have any concerns regarding medicines.  Venetia Night, PA-C 04/10/2022 1:47 PM     [address] [place] [id number] [phone] (office)  [phone] (fax)

## 2022-04-11 ENCOUNTER — Encounter (HOSPITAL_COMMUNITY)
Admission: RE | Admit: 2022-04-11 | Discharge: 2022-04-11 | Disposition: A | Discharge status: Home / Self Care | Payer: 59 | Source: Ambulatory Visit | Attending: Urology | Admitting: Urology

## 2022-04-11 NOTE — Pre-Procedure Instructions (Signed)
Attempted pre-op phone call. Left AM for her to call us back.

## 2022-04-12 ENCOUNTER — Ambulatory Visit: Payer: 59 | Attending: Physician Assistant | Admitting: Physician Assistant

## 2022-04-12 ENCOUNTER — Encounter: Payer: Self-pay | Admitting: Physician Assistant

## 2022-04-12 ENCOUNTER — Telehealth: Payer: Self-pay

## 2022-04-12 VITALS — BP 118/70 | HR 85 | Ht 67.0 in | Wt 212.0 lb

## 2022-04-12 DIAGNOSIS — Z95810 Presence of automatic (implantable) cardiac defibrillator: Secondary | ICD-10-CM

## 2022-04-12 DIAGNOSIS — I428 Other cardiomyopathies: Secondary | ICD-10-CM | POA: Diagnosis not present

## 2022-04-12 DIAGNOSIS — I10 Essential (primary) hypertension: Secondary | ICD-10-CM

## 2022-04-12 DIAGNOSIS — I5022 Chronic systolic (congestive) heart failure: Secondary | ICD-10-CM

## 2022-04-12 DIAGNOSIS — Z01818 Encounter for other preprocedural examination: Secondary | ICD-10-CM

## 2022-04-12 NOTE — Patient Instructions (Signed)
Medication Instructions:   Your physician recommends that you continue on your current medications as directed. Please refer to the Current Medication list given to you today.   *If you need a refill on your cardiac medications before your next appointment, please call your pharmacy*   Lab Work: NONE ORDERED  TODAY   If you have labs (blood work) drawn today and your tests are completely normal, you will receive your results only by: MyChart Message (if you have MyChart) OR A paper copy in the mail If you have any lab test that is abnormal or we need to change your treatment, we will call you to review the results.   Testing/Procedures: NONE ORDERED  TODAY     Follow-Up: At [facility], you and your health needs are our priority.  As part of our continuing mission to provide you with exceptional heart care, we have created designated Provider Care Teams.  These Care Teams include your primary Cardiologist (physician) and Advanced Practice Providers (APPs -  Physician Assistants and Nurse Practitioners) who all work together to provide you with the care you need, when you need it.  We recommend signing up for the patient portal called "MyChart".  Sign up information is provided on this After Visit Summary.  MyChart is used to connect with patients for Virtual Visits (Telemedicine).  Patients are able to view lab/test results, encounter notes, upcoming appointments, etc.  Non-urgent messages can be sent to your provider as well.   To learn more about what you can do with MyChart, go to [email or web address].    Your next appointment:   6 month(s)   Provider:   You may see Dr. Taylor  or one of the following Advanced Practice Providers on your designated Care Team:   Renee Ursuy, PA-C   Other Instructions  

## 2022-04-12 NOTE — Telephone Encounter (Signed)
Patient wanted to let you know that her Cardiologist has d/c her plavix. Sx date 02/22

## 2022-04-13 ENCOUNTER — Ambulatory Visit: Payer: 59 | Admitting: Urology

## 2022-04-13 NOTE — Telephone Encounter (Signed)
Received cardiology approval to hold plavix.  I called and discussed with patient. Patient understands she will taker her last dose on 04/29/2022 Patient voiced understanding,

## 2022-04-20 ENCOUNTER — Encounter: Payer: Self-pay | Admitting: Internal Medicine

## 2022-04-20 NOTE — Progress Notes (Unsigned)
PERIOPERATIVE PRESCRIPTION FOR IMPLANTED CARDIAC DEVICE PROGRAMMING  Patient Information: Name:  Melanie Cordova  DOB:  07/22/1960  MRN:  [id number]  {TIP - You do not have to delete this tip  -  Copy the info from the staff message sent by the PAT staff  then press F2 here and paste the information using CTL - V on the next line :[id number]}   [place], URETEROSCOPY AND STENT EXCHANGE   Cleon Gustin, MD   05/05/22 Device Information:  Clinic EP Physician:  [name], MD   Device Type:  Defibrillator Manufacturer and Phone #:  St. Jude/Abbott: [phone] Pacemaker Dependent?:  No. Date of Last Device Check:  04/12/22 Normal Device Function?:  Yes.    Electrophysiologist's Recommendations:  Have magnet available. Provide continuous ECG monitoring when magnet is used or reprogramming is to be performed.  Procedure should not interfere with device function.  No device programming or magnet placement needed.  Per Device Clinic Standing Orders, Wanda Plump, RN  1:55 PM 04/20/2022

## 2022-04-25 ENCOUNTER — Ambulatory Visit: Payer: 59 | Admitting: Urology

## 2022-04-28 ENCOUNTER — Encounter: Payer: Self-pay | Admitting: *Deleted

## 2022-05-05 ENCOUNTER — Other Ambulatory Visit: Payer: Self-pay | Admitting: Urology

## 2022-05-05 ENCOUNTER — Ambulatory Visit (HOSPITAL_COMMUNITY): Payer: Medicare Other

## 2022-05-05 ENCOUNTER — Encounter (HOSPITAL_COMMUNITY): Payer: Self-pay | Admitting: Urology

## 2022-05-05 ENCOUNTER — Ambulatory Visit (HOSPITAL_COMMUNITY): Payer: 59 | Admitting: Anesthesiology

## 2022-05-05 ENCOUNTER — Ambulatory Visit (HOSPITAL_COMMUNITY)
Admission: RE | Admit: 2022-05-05 | Discharge: 2022-05-05 | Disposition: A | Discharge status: Home / Self Care | Payer: 59 | Attending: Urology | Admitting: Urology

## 2022-05-05 ENCOUNTER — Ambulatory Visit (HOSPITAL_COMMUNITY): Payer: 59

## 2022-05-05 ENCOUNTER — Ambulatory Visit (HOSPITAL_BASED_OUTPATIENT_CLINIC_OR_DEPARTMENT_OTHER): Payer: 59 | Admitting: Anesthesiology

## 2022-05-05 ENCOUNTER — Encounter (HOSPITAL_COMMUNITY)
Admission: RE | Disposition: A | Discharge status: Home / Self Care | Payer: Self-pay | Source: Home / Self Care | Attending: Urology

## 2022-05-05 DIAGNOSIS — N201 Calculus of ureter: Secondary | ICD-10-CM | POA: Diagnosis present

## 2022-05-05 DIAGNOSIS — E039 Hypothyroidism, unspecified: Secondary | ICD-10-CM | POA: Insufficient documentation

## 2022-05-05 DIAGNOSIS — K219 Gastro-esophageal reflux disease without esophagitis: Secondary | ICD-10-CM | POA: Diagnosis not present

## 2022-05-05 DIAGNOSIS — Z95810 Presence of automatic (implantable) cardiac defibrillator: Secondary | ICD-10-CM | POA: Insufficient documentation

## 2022-05-05 DIAGNOSIS — Z8673 Personal history of transient ischemic attack (TIA), and cerebral infarction without residual deficits: Secondary | ICD-10-CM | POA: Diagnosis not present

## 2022-05-05 DIAGNOSIS — I509 Heart failure, unspecified: Secondary | ICD-10-CM | POA: Insufficient documentation

## 2022-05-05 DIAGNOSIS — I110 Hypertensive heart disease with heart failure: Secondary | ICD-10-CM

## 2022-05-05 DIAGNOSIS — R519 Headache, unspecified: Secondary | ICD-10-CM | POA: Insufficient documentation

## 2022-05-05 DIAGNOSIS — F17210 Nicotine dependence, cigarettes, uncomplicated: Secondary | ICD-10-CM

## 2022-05-05 DIAGNOSIS — J449 Chronic obstructive pulmonary disease, unspecified: Secondary | ICD-10-CM | POA: Insufficient documentation

## 2022-05-05 DIAGNOSIS — I5022 Chronic systolic (congestive) heart failure: Secondary | ICD-10-CM

## 2022-05-05 DIAGNOSIS — Z79899 Other long term (current) drug therapy: Secondary | ICD-10-CM | POA: Insufficient documentation

## 2022-05-05 DIAGNOSIS — M1990 Unspecified osteoarthritis, unspecified site: Secondary | ICD-10-CM | POA: Insufficient documentation

## 2022-05-05 HISTORY — PX: HOLMIUM LASER APPLICATION: SHX5852

## 2022-05-05 HISTORY — PX: CYSTOSCOPY WITH RETROGRADE PYELOGRAM, URETEROSCOPY AND STENT PLACEMENT: SHX5789

## 2022-05-05 SURGERY — CYSTOURETEROSCOPY, WITH RETROGRADE PYELOGRAM AND STENT INSERTION
Anesthesia: General | Site: Ureter | Laterality: Left

## 2022-05-05 MED ORDER — LIDOCAINE HCL (CARDIAC) PF 50 MG/5ML IV SOSY
PREFILLED_SYRINGE | INTRAVENOUS | Status: DC | PRN
  Administered 2022-05-05: 100 mg via INTRAVENOUS

## 2022-05-05 MED ORDER — OXYCODONE-ACETAMINOPHEN 5-325 MG PO TABS: 1.0000 | ORAL_TABLET | ORAL | 0 refills | Status: DC | PRN

## 2022-05-05 MED ORDER — WATER FOR IRRIGATION, STERILE IR SOLN
Status: DC | PRN
  Administered 2022-05-05: 500 mL

## 2022-05-05 MED ORDER — SUGAMMADEX SODIUM 200 MG/2ML IV SOLN
INTRAVENOUS | Status: DC | PRN
  Administered 2022-05-05: 200 mg via INTRAVENOUS

## 2022-05-05 MED ORDER — PROPOFOL 10 MG/ML IV BOLUS
INTRAVENOUS | Status: DC | PRN
  Administered 2022-05-05: 200 mg via INTRAVENOUS

## 2022-05-05 MED ORDER — GENTAMICIN SULFATE 40 MG/ML IJ SOLN
5.0000 mg/kg | INTRAVENOUS | Status: AC
  Administered 2022-05-05: 400 mg via INTRAVENOUS
  Filled 2022-05-05: qty 10

## 2022-05-05 MED ORDER — FENTANYL CITRATE (PF) 100 MCG/2ML IJ SOLN
INTRAMUSCULAR | Status: DC | PRN
  Administered 2022-05-05: 50 ug via INTRAVENOUS

## 2022-05-05 MED ORDER — DIATRIZOATE MEGLUMINE 30 % UR SOLN
URETHRAL | Status: AC
  Filled 2022-05-05: qty 100

## 2022-05-05 MED ORDER — DIATRIZOATE MEGLUMINE 30 % UR SOLN
URETHRAL | Status: DC | PRN
  Administered 2022-05-05: 10 mL via URETHRAL

## 2022-05-05 MED ORDER — CHLORHEXIDINE GLUCONATE 0.12 % MT SOLN
15.0000 mL | Freq: Once | OROMUCOSAL | Status: AC
  Administered 2022-05-05: 15 mL via OROMUCOSAL

## 2022-05-05 MED ORDER — MIDAZOLAM HCL 2 MG/2ML IJ SOLN
INTRAMUSCULAR | Status: AC
  Filled 2022-05-05: qty 2

## 2022-05-05 MED ORDER — ROCURONIUM 10MG/ML (10ML) SYRINGE FOR MEDFUSION PUMP - OPTIME
INTRAVENOUS | Status: DC | PRN
  Administered 2022-05-05: 60 mg via INTRAVENOUS

## 2022-05-05 MED ORDER — MIDAZOLAM HCL 5 MG/5ML IJ SOLN
INTRAMUSCULAR | Status: DC | PRN
  Administered 2022-05-05: 2 mg via INTRAVENOUS

## 2022-05-05 MED ORDER — ORAL CARE MOUTH RINSE: 15.0000 mL | Freq: Once | OROMUCOSAL | Status: AC

## 2022-05-05 MED ORDER — ONDANSETRON HCL 4 MG PO TABS: 4.0000 mg | ORAL_TABLET | Freq: Every day | ORAL | 1 refills | Status: AC | PRN

## 2022-05-05 MED ORDER — ONDANSETRON HCL 4 MG/2ML IJ SOLN
INTRAMUSCULAR | Status: DC | PRN
  Administered 2022-05-05: 4 mg via INTRAVENOUS

## 2022-05-05 MED ORDER — PHENYLEPHRINE HCL (PRESSORS) 10 MG/ML IV SOLN
INTRAVENOUS | Status: DC | PRN
  Administered 2022-05-05 (×5): 120 ug via INTRAVENOUS

## 2022-05-05 MED ORDER — SODIUM CHLORIDE 0.9 % IR SOLN
Status: DC | PRN
  Administered 2022-05-05 (×2): 3000 mL

## 2022-05-05 MED ORDER — LACTATED RINGERS IV SOLN: INTRAVENOUS | Status: DC

## 2022-05-05 SURGICAL SUPPLY — 25 items
BAG DRAIN URO TABLE W/ADPT NS (BAG) ×1 IMPLANT
BAG DRN 8 ADPR NS SKTRN CSTL (BAG) ×1
BAG HAMPER (MISCELLANEOUS) ×1 IMPLANT
CATH INTERMIT  6FR 70CM (CATHETERS) ×1 IMPLANT
CLOTH BEACON ORANGE TIMEOUT ST (SAFETY) ×1 IMPLANT
DECANTER SPIKE VIAL GLASS SM (MISCELLANEOUS) ×1 IMPLANT
EXTRACTOR STONE NITINOL NGAGE (UROLOGICAL SUPPLIES) IMPLANT
GLOVE BIO SURGEON STRL SZ8 (GLOVE) ×1 IMPLANT
GLOVE BIOGEL PI IND STRL 7.0 (GLOVE) ×2 IMPLANT
GOWN STRL REUS W/TWL LRG LVL3 (GOWN DISPOSABLE) ×1 IMPLANT
GOWN STRL REUS W/TWL XL LVL3 (GOWN DISPOSABLE) ×1 IMPLANT
GUIDEWIRE STR DUAL SENSOR (WIRE) ×1 IMPLANT
GUIDEWIRE STR ZIPWIRE 035X150 (MISCELLANEOUS) ×1 IMPLANT
IV NS IRRIG 3000ML ARTHROMATIC (IV SOLUTION) ×2 IMPLANT
KIT TURNOVER CYSTO (KITS) ×1 IMPLANT
MANIFOLD NEPTUNE II (INSTRUMENTS) ×1 IMPLANT
PACK CYSTO (CUSTOM PROCEDURE TRAY) ×1 IMPLANT
PAD ARMBOARD 7.5X6 YLW CONV (MISCELLANEOUS) ×1 IMPLANT
STENT URET 6FRX26 CONTOUR (STENTS) IMPLANT
SYR 10ML LL (SYRINGE) ×1 IMPLANT
SYR CONTROL 10ML LL (SYRINGE) ×1 IMPLANT
TOWEL OR 17X26 4PK STRL BLUE (TOWEL DISPOSABLE) ×1 IMPLANT
TRACTIP FLEXIVA PULS ID 200XHI (Laser) IMPLANT
TRACTIP FLEXIVA PULSE ID 200 (Laser) ×1
WATER STERILE IRR 500ML POUR (IV SOLUTION) ×1 IMPLANT

## 2022-05-05 NOTE — Anesthesia Procedure Notes (Signed)
Procedure Name: Intubation Date/Time: 05/05/2022 10:42 AM  Performed by: Ollen Bowl, CRNAPre-anesthesia Checklist: Patient identified, Patient being monitored, Timeout performed, Emergency Drugs available and Suction available Patient Re-evaluated:Patient Re-evaluated prior to induction Oxygen Delivery Method: Circle system utilized Preoxygenation: Pre-oxygenation with 100% oxygen Induction Type: IV induction Ventilation: Mask ventilation without difficulty Laryngoscope Size: Mac and 3 Grade View: Grade I Tube type: Oral Tube size: 7.0 mm Number of attempts: 1 Airway Equipment and Method: Stylet Placement Confirmation: ETT inserted through vocal cords under direct vision, positive ETCO2 and breath sounds checked- equal and bilateral Secured at: 21 cm Tube secured with: Tape Dental Injury: Teeth and Oropharynx as per pre-operative assessment

## 2022-05-05 NOTE — Anesthesia Preprocedure Evaluation (Addendum)
Anesthesia Evaluation  Patient identified by MRN, date of birth, ID band Patient awake    Reviewed: Allergy & Precautions, NPO status , Patient's Chart, lab work & pertinent test results, reviewed documented beta blocker date and time   History of Anesthesia Complications Negative for: history of anesthetic complications  Airway Mallampati: I  TM Distance: >3 FB Neck ROM: Full    Dental  (+) Upper Dentures, Lower Dentures   Pulmonary shortness of breath and with exertion, asthma , pneumonia, COPD,  COPD inhaler, Current Smoker   Pulmonary exam normal breath sounds clear to auscultation       Cardiovascular Exercise Tolerance: Good hypertension, Pt. on home beta blockers and Pt. on medications +CHF  Normal cardiovascular exam+ dysrhythmias + Cardiac Defibrillator (Non functioning devise)  Rhythm:Regular Rate:Normal  1. Left ventricular ejection fraction, by estimation, is 40 to 45%. The  left ventricle has mildly decreased function. The left ventricle  demonstrates global hypokinesis. The left ventricular internal cavity size  was mildly to moderately dilated. There  is mild left ventricular hypertrophy. Left ventricular diastolic  parameters are consistent with Grade I diastolic dysfunction (impaired  relaxation).   2. Right ventricular systolic function is normal. The right ventricular  size is normal.   3. Left atrial size was mildly dilated.   4. The mitral valve is normal in structure. Mild mitral valve  regurgitation. No evidence of mitral stenosis.   5. The aortic valve is normal in structure. Aortic valve regurgitation is  not visualized. No aortic stenosis is present.   6. The inferior vena cava is normal in size with greater than 50%  respiratory variability, suggesting right atrial pressure of 3 mmHg.   Comparison(s): No significant change from prior study. Prior images  reviewed side by side.   EKG - SR with  occasional PVCs, Non specific ST and T wave abnormalities   Neuro/Psych  Headaches, Seizures - (not on meds), Well Controlled,   Anxiety     TIA Neuromuscular disease CVA, No Residual Symptoms    GI/Hepatic ,GERD  Medicated and Poorly Controlled,,  Endo/Other  Hypothyroidism    Renal/GU Renal disease     Musculoskeletal  (+) Arthritis , Osteoarthritis,    Abdominal   Peds  Hematology   Anesthesia Other Findings   Reproductive/Obstetrics                             Anesthesia Physical Anesthesia Plan  ASA: 3  Anesthesia Plan: General   Post-op Pain Management: Dilaudid IV   Induction: Intravenous  PONV Risk Score and Plan: 4 or greater and Ondansetron and Dexamethasone  Airway Management Planned: Oral ETT  Additional Equipment:   Intra-op Plan:   Post-operative Plan: Extubation in OR  Informed Consent: I have reviewed the patients History and Physical, chart, labs and discussed the procedure including the risks, benefits and alternatives for the proposed anesthesia with the patient or authorized representative who has indicated his/her understanding and acceptance.     Dental advisory given  Plan Discussed with: CRNA and Surgeon  Anesthesia Plan Comments:        Anesthesia Quick Evaluation

## 2022-05-05 NOTE — Transfer of Care (Signed)
Immediate Anesthesia Transfer of Care Note  Patient: Melanie Cordova  Procedure(s) Performed: CYSTOSCOPY WITH RETROGRADE PYELOGRAM, URETEROSCOPY AND STENT EXCHANGE (Left: Ureter) HOLMIUM LASER APPLICATION (Left: Ureter)  Patient Location: PACU  Anesthesia Type:General  Level of Consciousness: awake  Airway & Oxygen Therapy: Patient Spontanous Breathing  Post-op Assessment: Report given to RN  Post vital signs: Reviewed and stable  Last Vitals:  Vitals Value Taken Time  BP 95/57 05/05/22 1137  Temp    Pulse 78 05/05/22 1138  Resp 22 05/05/22 1138  SpO2 94 % 05/05/22 1138  Vitals shown include unvalidated device data.  Last Pain:  Vitals:   05/05/22 0946  TempSrc: Oral  PainSc: 0-No pain         Complications: No notable events documented.

## 2022-05-05 NOTE — H&P (Signed)
HPI: [name] is a 62yo here for followup for nephrolithiasis. She underwent bilateral ureteral stent placement 3 weeks ago for bilateral UPJ calculi and sepsis. She has mild bilateral flank pain with the stent in place. She has urinary urgency and frequency but no dysuria or gross hematuria. NO other complaints today     PMH:     Past Medical History:  Diagnosis Date   Anxiety     Arthritis     Asthma     Automatic implantable cardioverter-defibrillator in situ      2013, july   Bell palsy      states has had 3 episodes   Cardiomyopathy 08/2010    Presented with congestive heart failure; EF of 15% and 2012; hypotension on medication precludes optimal dosing   CHF (congestive heart failure) ([place])      a. EF 20% in 2012 with low-risk NST --> St. Jude ICD implantation by Dr. Lovena Le in 09/2011 b. NST in 12/2017 showing evidence of prior infarct without ischemia c. EF at 45-50% by echo in [id number]   Chronic systolic heart failure ([place])     COPD (chronic obstructive pulmonary disease) ([place])      2013   Dysrhythmia     Gastroesophageal reflux disease     Headache(784.0)     Hot flashes 10/04/2016   Hyperlipidemia     Hypertension      09/2010-normal CMet and CBC; Lipid profile-116, 88, 25, 73   Hypothyroidism      Recent TSH was normal.   ICD (implantable cardiac defibrillator) in place 10/10/2011   Neuromuscular disorder (HCC)      Neuropathy, right foot and leg   Neuropathy     Obesity     Pneumonia     Seizures ([place])      last one at age 46   Shortness of breath     Stroke ([place])       stroke in 07/2012 and another in June, 2014   Tobacco abuse      20 pack years      Surgical History:      Past Surgical History:  Procedure Laterality Date   CESAREAN SECTION        X2   COLONOSCOPY N/A 12/16/2013    One simple adenoma and 3 hyperplastic polyps. Small internal hemorrhoids. Left colon redundant. Due for surveillance 2020.    COLONOSCOPY WITH PROPOFOL N/A 06/04/2019     Procedure: COLONOSCOPY WITH PROPOFOL;  Surgeon: Danie Binder, MD;  [facility];  Service: Endoscopy;  Laterality: N/A;  10:30am   CYSTOSCOPY W/ URETERAL STENT PLACEMENT Bilateral 03/01/2022    Procedure: CYSTOSCOPY WITH RETROGRADE PYELOGRAM/URETERAL STENT PLACEMENT;  Surgeon: Cleon Gustin, MD;  Location: [facility];  Service: Urology;  Laterality: Bilateral;   EP IMPLANTABLE DEVICE        St. Jude   EP IMPLANTABLE DEVICE N/A 08/13/2015    Procedure: Loop Recorder Insertion;  Surgeon: Evans Lance, MD;  Location: [facility];  Service: Cardiovascular;  Laterality: N/A;   IMPLANTABLE CARDIOVERTER DEFIBRILLATOR IMPLANT N/A 10/10/2011    Procedure: IMPLANTABLE CARDIOVERTER DEFIBRILLATOR IMPLANT;  Surgeon: Evans Lance, MD;  Location: [facility] CATH LAB;  Service: Cardiovascular;  Laterality: N/A;   MULTIPLE EXTRACTIONS WITH ALVEOLOPLASTY N/A 09/24/2012    Procedure: MULTIPLE EXTRACION #2, 4, 6, 7 ,8, 9, 11, 13, 18, 20, 21, 22, 23, 24, 25, 26, 27, 29 WITH ALVEOLOPLASTY, BIOPSY OF PALATE LESION, REMOVA RIGHT LINGUAL TORUS;  Surgeon: Gae Bon, DDS;  Location: [facility];  Service: Oral Surgery;  Laterality: N/A;   POLYPECTOMY   06/04/2019    Procedure: POLYPECTOMY;  Surgeon: Danie Binder, MD;  [facility];  Service: Endoscopy;;   TEE WITHOUT CARDIOVERSION N/A 07/11/2012    Procedure: TRANSESOPHAGEAL ECHOCARDIOGRAM (TEE);  Surgeon: Thayer Headings, MD;  Location: [facility];  Service: Cardiovascular;  Laterality: N/A;   TUBAL LIGATION          Home Medications:  Allergies as of 03/15/2022         Reactions    Fish Allergy Anaphylaxis    Iodinated Contrast Media Hives, Other (See Comments)    Pulmonary problems; no frank respiratory arrest    Iodine Hives, Other (See Comments)    Pulmonary Problems     Lentil Anaphylaxis    Penicillins Anaphylaxis, Shortness Of Breath    Has patient had a PCN reaction causing immediate rash, facial/tongue/throat swelling, SOB or  lightheadedness with hypotension: Yes Has patient had a PCN reaction causing severe rash involving mucus membranes or skin necrosis: No Has patient had a PCN reaction that required hospitalization No Has patient had a PCN reaction occurring within the last 10 years: No If all of the above answers are "NO", then may proceed with Cephalosporin use. Hair loss    Lipitor [atorvastatin] Itching, Rash    Spironolactone Rash    Sulfa Antibiotics Other (See Comments)    Unknown            Medication List           Accurate as of March 15, 2022 11:59 PM. If you have any questions, ask your nurse or doctor.              acetaminophen 650 MG CR tablet Commonly known as: TYLENOL Take 650 mg by mouth every 8 (eight) hours as needed for pain.    albuterol 108 (90 Base) MCG/ACT inhaler Commonly known as: VENTOLIN HFA Inhale 2 puffs into the lungs every 6 (six) hours as needed for wheezing or shortness of breath.    aspirin EC 81 MG tablet Take 1 tablet (81 mg total) by mouth daily.    Biotin 10000 MCG Tabs Take 10,000 mcg by mouth daily.    budesonide-formoterol 80-4.5 MCG/ACT inhaler Commonly known as: SYMBICORT Inhale 2 puffs into the lungs 2 (two) times daily.    calcium carbonate 500 MG chewable tablet Commonly known as: TUMS - dosed in mg elemental calcium Chew 1 tablet by mouth daily as needed for indigestion or heartburn.    carvedilol 12.5 MG tablet Commonly known as: COREG Take 25 mg by mouth 2 (two) times daily with a meal.    CENTRAVITES 50 PLUS PO Take 1 tablet by mouth daily.    cetirizine 10 MG tablet Commonly known as: ZYRTEC Take 10 mg by mouth daily.    clobetasol 0.05 % external solution Commonly known as: TEMOVATE Apply 1 application topically 2 (two) times daily as needed (Apply to scalp.).    clopidogrel 75 MG tablet Commonly known as: PLAVIX Take 1 tablet (75 mg total) by mouth daily.    cyclobenzaprine 5 MG tablet Commonly known as:  FLEXERIL TAKE ONE TABLET BY MOUTH AT BEDTIME What changed: how much to take    Entresto 24-26 MG Generic drug: sacubitril-valsartan TAKE ONE TABLET BY MOUTH 2 TIMES A DAY    famotidine 20 MG tablet Commonly known as: PEPCID Take 20 mg by mouth daily  as needed for heartburn or indigestion.    fluticasone 50 MCG/ACT nasal spray Commonly known as: FLONASE Place 2 sprays into both nostrils daily.    furosemide 40 MG tablet Commonly known as: LASIX Take 1 tablet (40 mg total) by mouth 2 (two) times daily.    Iron 28 MG Tabs Take 28 mg by mouth daily.    ketoconazole 2 % cream Commonly known as: NIZORAL Apply 1 application topically 2 (two) times daily.    levothyroxine 200 MCG tablet Commonly known as: SYNTHROID Take 1 tablet (200 mcg total) by mouth daily before breakfast.    Linzess 145 MCG Caps capsule Generic drug: linaclotide Take 145 mcg by mouth daily.    oxyCODONE-acetaminophen 5-325 MG tablet Commonly known as: Percocet Take 1 tablet by mouth every 4 (four) hours as needed.    pantoprazole 20 MG tablet Commonly known as: PROTONIX Take 1 tablet (20 mg total) by mouth daily.    potassium chloride SA 20 MEQ tablet Commonly known as: KLOR-CON M Take 20 mEq by mouth daily.    rosuvastatin 20 MG tablet Commonly known as: CRESTOR Take 20 mg by mouth daily.    topiramate 100 MG tablet Commonly known as: TOPAMAX TAKE ONE TABLET BY MOUTH AT BEDTIME What changed: when to take this    Ubrelvy 50 MG Tabs Generic drug: Ubrogepant Take 50 mg by mouth as needed. Can repeat a dose in 2 hours if headache persists    Vitamin D (Ergocalciferol) 1.25 MG (50000 UNIT) Caps capsule Commonly known as: DRISDOL Take 50,000 Units by mouth every 30 (thirty) days.             Allergies:       Allergies  Allergen Reactions   Fish Allergy Anaphylaxis   Iodinated Contrast Media Hives and Other (See Comments)      Pulmonary problems; no frank respiratory arrest   Iodine  Hives and Other (See Comments)      Pulmonary Problems    Lentil Anaphylaxis   Penicillins Anaphylaxis and Shortness Of Breath      Has patient had a PCN reaction causing immediate rash, facial/tongue/throat swelling, SOB or lightheadedness with hypotension: Yes Has patient had a PCN reaction causing severe rash involving mucus membranes or skin necrosis: No Has patient had a PCN reaction that required hospitalization No Has patient had a PCN reaction occurring within the last 10 years: No If all of the above answers are "NO", then may proceed with Cephalosporin use.   Hair loss   Lipitor [Atorvastatin] Itching and Rash   Spironolactone Rash   Sulfa Antibiotics Other (See Comments)      Unknown      Family History:      Family History  Problem Relation Age of Onset   Cardiomyopathy Mother          ICD pacemaker-ischmic CM   Heart failure Mother     Hypertension Mother     Diabetes Mother     Thyroid disease Mother     Cardiomyopathy Father          Deceased   Coronary artery disease Father     Heart failure Father     Heart failure Brother     Hypertension Brother     Endometriosis Sister     Heart disease Paternal Grandfather     Heart disease Paternal Grandmother     Heart disease Maternal Grandmother     Heart disease Maternal Grandfather     Hypertension  Daughter     Obesity Daughter     Colon cancer Neg Hx     Liver disease Neg Hx        Social History:  reports that she has been smoking cigarettes. She started smoking about 38 years ago. She has a 15.75 pack-year smoking history. She has never used smokeless tobacco. She reports that she does not drink alcohol and does not use drugs.   ROS: All other review of systems were reviewed and are negative except what is noted above in HPI   Physical Exam: BP 101/67   Pulse 84   Constitutional:  Alert and oriented, No acute distress. HEENT: [place] AT, moist mucus membranes.  Trachea midline, no masses. Cardiovascular:  No clubbing, cyanosis, or edema. Respiratory: Normal respiratory effort, no increased work of breathing. GI: Abdomen is soft, nontender, nondistended, no abdominal masses GU: No CVA tenderness.  Lymph: No cervical or inguinal lymphadenopathy. Skin: No rashes, bruises or suspicious lesions. Neurologic: Grossly intact, no focal deficits, moving all 4 extremities. Psychiatric: Normal mood and affect.   Laboratory Data: Recent Labs       Lab Results  Component Value Date    WBC 12.0 (H) 03/02/2022    HGB 11.7 (L) 03/02/2022    HCT 36.5 03/02/2022    MCV 92.6 03/02/2022    PLT 183 03/02/2022        Recent Labs       Lab Results  Component Value Date    CREATININE 0.99 03/02/2022        Recent Labs  No results found for: "PSA"     Recent Labs  No results found for: "TESTOSTERONE"     Recent Labs       Lab Results  Component Value Date    HGBA1C 5.7 (H) 07/03/2019        Urinalysis Labs (Brief)          Component Value Date/Time    COLORURINE AMBER (A) 02/28/2022 1615    APPEARANCEUR Cloudy (A) 03/15/2022 1522    LABSPEC 1.013 02/28/2022 1615    PHURINE 7.0 02/28/2022 1615    GLUCOSEU Negative 03/15/2022 1522    HGBUR MODERATE (A) 02/28/2022 1615    BILIRUBINUR Negative 03/15/2022 1522    KETONESUR NEGATIVE 02/28/2022 1615    PROTEINUR Trace 03/15/2022 1522    PROTEINUR >=300 (A) 02/28/2022 1615    UROBILINOGEN 0.2 04/29/2013 1702    NITRITE Negative 03/15/2022 1522    NITRITE POSITIVE (A) 02/28/2022 1615    LEUKOCYTESUR 2+ (A) 03/15/2022 1522    LEUKOCYTESUR LARGE (A) 02/28/2022 1615        Recent Labs       Lab Results  Component Value Date    LABMICR See below: 03/15/2022    WBCUA 6-10 (A) 03/15/2022    LABEPIT 0-10 03/15/2022    BACTERIA Few 03/15/2022        Pertinent Imaging: CT 02/28/2022: Images reviewed and discussed with the patient  No results found for this or any previous visit.   No results found for this or any previous  visit.   No results found for this or any previous visit.   No results found for this or any previous visit.   No results found for this or any previous visit.   No valid procedures specified. No results found for this or any previous visit.   No results found for this or any previous visit.     Assessment & Plan:  1. Bilateral ureteral calculi -We discussed the management of kidney stones. These options include observation, ureteroscopy, shockwave lithotripsy (ESWL) and percutaneous nephrolithotomy (PCNL). We discussed which options are relevant to the patient's stone(s). We discussed the natural history of kidney stones as well as the complications of untreated stones and the impact on quality of life without treatment as well as with each of the above listed treatments. We also discussed the efficacy of each treatment in its ability to clear the stone burden. With any of these management options I discussed the signs and symptoms of infection and the need for emergent treatment should these be experienced. For each option we discussed the ability of each procedure to clear the patient of their stone burden.   For observation I described the risks which include but are not limited to silent renal damage, life-threatening infection, need for emergent surgery, failure to pass stone and pain.   For ureteroscopy I described the risks which include bleeding, infection, damage to contiguous structures, positioning injury, ureteral stricture, ureteral avulsion, ureteral injury, need for prolonged ureteral stent, inability to perform ureteroscopy, need for an interval procedure, inability to clear stone burden, stent discomfort/pain, heart attack, stroke, pulmonary embolus and the inherent risks with general anesthesia.   For shockwave lithotripsy I described the risks which include arrhythmia, kidney contusion, kidney hemorrhage, need for transfusion, pain, inability to adequately break up  stone, inability to pass stone fragments, Steinstrasse, infection associated with obstructing stones, need for alternate surgical procedure, need for repeat shockwave lithotripsy, MI, CVA, PE and the inherent risks with anesthesia/conscious sedation.   For PCNL I described the risks including positioning injury, pneumothorax, hydrothorax, need for chest tube, inability to clear stone burden, renal laceration, arterial venous fistula or malformation, need for embolization of kidney, loss of kidney or renal function, need for repeat procedure, need for prolonged nephrostomy tube, ureteral avulsion, MI, CVA, PE and the inherent risks of general anesthesia.   - The patient would like to proceed with left ureteroscopic stone extraction - Urinalysis, Routine w reflex microscopic

## 2022-05-05 NOTE — Anesthesia Postprocedure Evaluation (Signed)
Anesthesia Post Note  Patient: Melanie Cordova  Procedure(s) Performed: CYSTOSCOPY WITH RETROGRADE PYELOGRAM, URETEROSCOPY AND STENT EXCHANGE (Left: Ureter) HOLMIUM LASER APPLICATION (Left: Ureter)  Patient location during evaluation: Phase II Anesthesia Type: General Level of consciousness: awake and alert and oriented Pain management: pain level controlled Vital Signs Assessment: post-procedure vital signs reviewed and stable Respiratory status: spontaneous breathing, nonlabored ventilation and respiratory function stable Cardiovascular status: blood pressure returned to baseline and stable Postop Assessment: no apparent nausea or vomiting Anesthetic complications: no  No notable events documented.   Last Vitals:  Vitals:   05/05/22 1228 05/05/22 1230  BP: 104/66   Pulse: 74 78  Resp: 16   Temp:    SpO2: 97% 97%    Last Pain:  Vitals:   05/05/22 1228  TempSrc:   PainSc: 0-No pain                 Emerald Shor C Taya Ashbaugh

## 2022-05-05 NOTE — Op Note (Signed)
.  Preoperative diagnosis: bilateral ureteral stone  Postoperative diagnosis: Same  Procedure: 1 cystoscopy 2.  Left retrograde pyelography 3.  Intraoperative fluoroscopy, under one hour, with interpretation 4.  left ureteroscopic stone manipulation with laser lithotripsy 5.  Left 6 x 26 JJ stent placement  Attending: Rosie Fate  Anesthesia: General  Estimated blood loss: None  Drains: left 6 x 26 JJ ureteral stent without tether  Specimens: stone for analysis  Antibiotics: gentamicin  Findings: left proximal ureteral calculus. No left hydronephrosis. No masses/lesions in the bladder. Ureteral orifices in normal anatomic location.  Indications: Patient is a 62 year old female with a history of bilateral ureteral calculi who underwent bilateral stent placement. After discussing treatment options, they decided proceed with left ureteroscopic stone manipulation.  Procedure in detail: The patient was brought to the operating room and a brief timeout was done to ensure correct patient, correct procedure, correct site.  General anesthesia was administered patient was placed in dorsal lithotomy position.  Her genitalia was then prepped and draped in usual sterile fashion.  A rigid 78 French cystoscope was passed in the urethra and the bladder.  Bladder was inspected free masses or lesions.  the ureteral orifices were in the normal orthotopic locations. Using a grasper the ureteral stent was brought to the urethral meatus. Through the stent a zipwire was advanced up to the renal pelvis. The stent was then removed. a 6 french ureteral catheter was then instilled into the left ureteral orifice.  a gentle retrograde was obtained and findings noted above.  we then removed the cystoscope and cannulated the left ureteral orifice with a semirigid ureteroscope.  We located the stone in the proximal ureter which was fragmented with a 242nm laser fiber. The stone fragments were then removed with a NGage  basket. We then placed a 6 x 26 double-j ureteral stent over the original zip wire. We then removed the wire and good coil was noted in the the renal pelvis under fluoroscopy and the bladder under direct vision.  the bladder was then drained and this concluded the procedure which was well tolerated by patient.  Complications: None  Condition: Stable, extubated, transferred to PACU  Plan: Patient is to be discharged home as to follow-up in 2-3 weeks for right ureteroscopic stone extraction

## 2022-05-09 ENCOUNTER — Ambulatory Visit (HOSPITAL_COMMUNITY)
Admission: RE | Admit: 2022-05-09 | Discharge: 2022-05-09 | Disposition: A | Discharge status: Home / Self Care | Payer: 59 | Source: Ambulatory Visit | Attending: Gerontology | Admitting: Gerontology

## 2022-05-09 DIAGNOSIS — Z87891 Personal history of nicotine dependence: Secondary | ICD-10-CM | POA: Diagnosis present

## 2022-05-10 ENCOUNTER — Ambulatory Visit (INDEPENDENT_AMBULATORY_CARE_PROVIDER_SITE_OTHER): Payer: 59 | Admitting: Urology

## 2022-05-10 VITALS — BP 124/82 | HR 93

## 2022-05-10 DIAGNOSIS — N201 Calculus of ureter: Secondary | ICD-10-CM

## 2022-05-10 NOTE — Progress Notes (Unsigned)
05/10/2022 3:24 PM   Melanie Cordova 18-Jun-1960 [id number]  Referring provider: Benetta Spar, MD [address] [place],  [place] [address]  No chief complaint on file.   HPI:    PMH: Past Medical History:  Diagnosis Date   Anxiety    Arthritis    Asthma    Automatic implantable cardioverter-defibrillator in situ    2013, july   Bell palsy    states has had 3 episodes   Cardiomyopathy 08/2010   Presented with congestive heart failure; EF of 15% and 2012; hypotension on medication precludes optimal dosing   Chest pain 12/28/2017   CHF (congestive heart failure) (HCC)    a. EF 20% in 2012 with low-risk NST --> St. Jude ICD implantation by Dr. Ladona Ridgel in 09/2011 b. NST in 12/2017 showing evidence of prior infarct without ischemia c. EF at 45-50% by echo in 06/2019   Chronic systolic heart failure (HCC)    COPD (chronic obstructive pulmonary disease) (HCC)    2013   Dysrhythmia    Gastroesophageal reflux disease    Headache(784.0)    Hot flashes 10/04/2016   Hyperlipidemia    Hypertension    09/2010-normal CMet and CBC; Lipid profile-116, 88, 25, 73   Hypothyroidism    Recent TSH was normal.   ICD (implantable cardiac defibrillator) in place 10/10/2011   Neuromuscular disorder (HCC)    Neuropathy, right foot and leg   Neuropathy    Obesity    Pneumonia    Seizures (HCC)    last one at age 72   Shortness of breath    Stroke (HCC)     stroke in 07/2012 and another in June, 2014   Tobacco abuse    20 pack years    Surgical History: Past Surgical History:  Procedure Laterality Date   CESAREAN SECTION     X2   COLONOSCOPY N/A 12/16/2013   One simple adenoma and 3 hyperplastic polyps. Small internal hemorrhoids. Left colon redundant. Due for surveillance 2020.    COLONOSCOPY WITH PROPOFOL N/A 06/04/2019   Procedure: COLONOSCOPY WITH PROPOFOL;  Surgeon: West Bali, MD;  Location: [facility];  Service: Endoscopy;  Laterality: N/A;   10:30am   CYSTOSCOPY W/ URETERAL STENT PLACEMENT Bilateral 03/01/2022   Procedure: CYSTOSCOPY WITH RETROGRADE PYELOGRAM/URETERAL STENT PLACEMENT;  Surgeon: Malen Gauze, MD;  Location: [facility];  Service: Urology;  Laterality: Bilateral;   EP IMPLANTABLE DEVICE     St. Jude   EP IMPLANTABLE DEVICE N/A 08/13/2015   Procedure: Loop Recorder Insertion;  Surgeon: Marinus Maw, MD;  Location: [facility];  Service: Cardiovascular;  Laterality: N/A;   IMPLANTABLE CARDIOVERTER DEFIBRILLATOR IMPLANT N/A 10/10/2011   Procedure: IMPLANTABLE CARDIOVERTER DEFIBRILLATOR IMPLANT;  Surgeon: Marinus Maw, MD;  Location: [facility] CATH LAB;  Service: Cardiovascular;  Laterality: N/A;   MULTIPLE EXTRACTIONS WITH ALVEOLOPLASTY N/A 09/24/2012   Procedure: MULTIPLE EXTRACION #2, 4, 6, 7 ,8, 9, 11, 13, 18, 20, 21, 22, 23, 24, 25, 26, 27, 29 WITH ALVEOLOPLASTY, BIOPSY OF PALATE LESION, REMOVA RIGHT LINGUAL TORUS;  Surgeon: Georgia Lopes, DDS;  Location: [facility] OR;  Service: Oral Surgery;  Laterality: N/A;   POLYPECTOMY  06/04/2019   Procedure: POLYPECTOMY;  Surgeon: West Bali, MD;  Location: [facility];  Service: Endoscopy;;   TEE WITHOUT CARDIOVERSION N/A 07/11/2012   Procedure: TRANSESOPHAGEAL ECHOCARDIOGRAM (TEE);  Surgeon: Vesta Mixer, MD;  Location: [facility] ENDOSCOPY;  Service: Cardiovascular;  Laterality: N/A;  TUBAL LIGATION      Home Medications:  Allergies as of 05/10/2022       Reactions   Fish Allergy Anaphylaxis   Iodinated Contrast Media Hives, Other (See Comments)   Pulmonary problems; no frank respiratory arrest   Iodine Hives, Other (See Comments)   Pulmonary Problems    Lentil Anaphylaxis   Penicillins Anaphylaxis, Shortness Of Breath   Has patient had a PCN reaction causing immediate rash, facial/tongue/throat swelling, SOB or lightheadedness with hypotension: Yes Has patient had a PCN reaction causing severe rash involving mucus membranes or skin necrosis: No Has patient had a PCN  reaction that required hospitalization No Has patient had a PCN reaction occurring within the last 10 years: No If all of the above answers are "NO", then may proceed with Cephalosporin use. Hair loss   Lipitor [atorvastatin] Itching, Rash   Spironolactone Rash   Sulfa Antibiotics Rash   Irritates throat         Medication List        Accurate as of May 10, 2022  3:24 PM. If you have any questions, ask your nurse or doctor.          acetaminophen 650 MG CR tablet Commonly known as: TYLENOL Take 650 mg by mouth at bedtime.   albuterol 108 (90 Base) MCG/ACT inhaler Commonly known as: VENTOLIN HFA Inhale 2 puffs into the lungs every 6 (six) hours as needed for wheezing or shortness of breath.   aspirin EC 81 MG tablet Take 1 tablet (81 mg total) by mouth daily.   Biotin 5000 MCG Caps Take 15,000 mcg by mouth daily.   budesonide-formoterol 80-4.5 MCG/ACT inhaler Commonly known as: SYMBICORT Inhale 2 puffs into the lungs 2 (two) times daily.   carvedilol 12.5 MG tablet Commonly known as: COREG Take 25 mg by mouth 2 (two) times daily with a meal.   CENTRAVITES 50 PLUS PO Take 1 tablet by mouth daily.   cetirizine 10 MG tablet Commonly known as: ZYRTEC Take 10 mg by mouth daily.   clobetasol 0.05 % external solution Commonly known as: TEMOVATE Apply 1 application  topically 2 (two) times daily as needed (psoriasis). Apply to scalp   clopidogrel 75 MG tablet Commonly known as: PLAVIX Take 1 tablet (75 mg total) by mouth daily.   cyclobenzaprine 10 MG tablet Commonly known as: FLEXERIL Take 10 mg by mouth at bedtime as needed for muscle spasms.   Entresto 24-26 MG Generic drug: sacubitril-valsartan TAKE ONE TABLET BY MOUTH 2 TIMES A DAY   famotidine 20 MG tablet Commonly known as: PEPCID Take 20 mg by mouth daily as needed for heartburn or indigestion.   ferrous sulfate 325 (65 FE) MG EC tablet Take 325 mg by mouth daily.   fluticasone 50 MCG/ACT  nasal spray Commonly known as: FLONASE Place 2 sprays into both nostrils daily.   furosemide 40 MG tablet Commonly known as: LASIX Take 1 tablet (40 mg total) by mouth 2 (two) times daily.   ketoconazole 2 % cream Commonly known as: NIZORAL Apply 1 application  topically 2 (two) times daily as needed (psoriasis).   levothyroxine 200 MCG tablet Commonly known as: SYNTHROID Take 1 tablet (200 mcg total) by mouth daily before breakfast.   levothyroxine 175 MCG tablet Commonly known as: SYNTHROID Take 175 mcg by mouth every morning.   ondansetron 4 MG tablet Commonly known as: Zofran Take 1 tablet (4 mg total) by mouth daily as needed for nausea or vomiting.  oxyCODONE-acetaminophen 5-325 MG tablet Commonly known as: PERCOCET/ROXICET TAKE ONE TABLET BY MOUTH EVERY 4 HOURS AS NEEDED   pantoprazole 20 MG tablet Commonly known as: PROTONIX Take 1 tablet (20 mg total) by mouth daily.   potassium chloride SA 20 MEQ tablet Commonly known as: KLOR-CON M Take 20 mEq by mouth daily.   rosuvastatin 20 MG tablet Commonly known as: CRESTOR Take 20 mg by mouth daily.   topiramate 100 MG tablet Commonly known as: TOPAMAX TAKE ONE TABLET BY MOUTH AT BEDTIME   Ubrelvy 50 MG Tabs Generic drug: Ubrogepant Take 50 mg by mouth as needed. Can repeat a dose in 2 hours if headache persists   Vitamin D (Ergocalciferol) 1.25 MG (50000 UNIT) Caps capsule Commonly known as: DRISDOL Take 50,000 Units by mouth every 30 (thirty) days.        Allergies:  Allergies  Allergen Reactions   Fish Allergy Anaphylaxis   Iodinated Contrast Media Hives and Other (See Comments)    Pulmonary problems; no frank respiratory arrest   Iodine Hives and Other (See Comments)    Pulmonary Problems    Lentil Anaphylaxis   Penicillins Anaphylaxis and Shortness Of Breath    Has patient had a PCN reaction causing immediate rash, facial/tongue/throat swelling, SOB or lightheadedness with hypotension: Yes Has  patient had a PCN reaction causing severe rash involving mucus membranes or skin necrosis: No Has patient had a PCN reaction that required hospitalization No Has patient had a PCN reaction occurring within the last 10 years: No If all of the above answers are "NO", then may proceed with Cephalosporin use.  Hair loss   Lipitor [Atorvastatin] Itching and Rash   Spironolactone Rash   Sulfa Antibiotics Rash    Irritates throat     Family History: Family History  Problem Relation Age of Onset   Cardiomyopathy Mother        ICD pacemaker-ischmic CM   Heart failure Mother    Hypertension Mother    Diabetes Mother    Thyroid disease Mother    Cardiomyopathy Father        Deceased   Coronary artery disease Father    Heart failure Father    Heart failure Brother    Hypertension Brother    Endometriosis Sister    Heart disease Paternal Grandfather    Heart disease Paternal Grandmother    Heart disease Maternal Grandmother    Heart disease Maternal Grandfather    Hypertension Daughter    Obesity Daughter    Colon cancer Neg Hx    Liver disease Neg Hx     Social History:  reports that she has been smoking cigarettes. She started smoking about 38 years ago. She has a 15.75 pack-year smoking history. She has never used smokeless tobacco. She reports that she does not drink alcohol and does not use drugs.  ROS: All other review of systems were reviewed and are negative except what is noted above in HPI  Physical Exam: BP 124/82   Pulse 93   Constitutional:  Alert and oriented, No acute distress. HEENT: [place] AT, moist mucus membranes.  Trachea midline, no masses. Cardiovascular: No clubbing, cyanosis, or edema. Respiratory: Normal respiratory effort, no increased work of breathing. GI: Abdomen is soft, nontender, nondistended, no abdominal masses GU: No CVA tenderness.  Lymph: No cervical or inguinal lymphadenopathy. Skin: No rashes, bruises or suspicious lesions. Neurologic:  Grossly intact, no focal deficits, moving all 4 extremities. Psychiatric: Normal mood and affect.  Laboratory Data: Lab  Results  Component Value Date   WBC 12.0 (H) 03/02/2022   HGB 11.7 (L) 03/02/2022   HCT 36.5 03/02/2022   MCV 92.6 03/02/2022   PLT 183 03/02/2022    Lab Results  Component Value Date   CREATININE 0.99 03/02/2022    No results found for: "PSA"  No results found for: "TESTOSTERONE"  Lab Results  Component Value Date   HGBA1C 5.7 (H) 07/03/2019    Urinalysis    Component Value Date/Time   COLORURINE AMBER (A) 02/28/2022 1615   APPEARANCEUR Cloudy (A) 03/15/2022 1522   LABSPEC 1.013 02/28/2022 1615   PHURINE 7.0 02/28/2022 1615   GLUCOSEU Negative 03/15/2022 1522   HGBUR MODERATE (A) 02/28/2022 1615   BILIRUBINUR Negative 03/15/2022 1522   KETONESUR NEGATIVE 02/28/2022 1615   PROTEINUR Trace 03/15/2022 1522   PROTEINUR >=300 (A) 02/28/2022 1615   UROBILINOGEN 0.2 04/29/2013 1702   NITRITE Negative 03/15/2022 1522   NITRITE POSITIVE (A) 02/28/2022 1615   LEUKOCYTESUR 2+ (A) 03/15/2022 1522   LEUKOCYTESUR LARGE (A) 02/28/2022 1615    Lab Results  Component Value Date   LABMICR See below: 03/15/2022   WBCUA 6-10 (A) 03/15/2022   LABEPIT 0-10 03/15/2022   BACTERIA Few 03/15/2022    Pertinent Imaging: *** No results found for this or any previous visit.  No results found for this or any previous visit.  No results found for this or any previous visit.  No results found for this or any previous visit.  No results found for this or any previous visit.  No valid procedures specified. No results found for this or any previous visit.  No results found for this or any previous visit.   Assessment & Plan:    1. Right renal calculus -We discussed the management of kidney stones. These options include observation, ureteroscopy, shockwave lithotripsy (ESWL) and percutaneous nephrolithotomy (PCNL). We discussed which options are relevant to  the patient's stone(s). We discussed the natural history of kidney stones as well as the complications of untreated stones and the impact on quality of life without treatment as well as with each of the above listed treatments. We also discussed the efficacy of each treatment in its ability to clear the stone burden. With any of these management options I discussed the signs and symptoms of infection and the need for emergent treatment should these be experienced. For each option we discussed the ability of each procedure to clear the patient of their stone burden.   For observation I described the risks which include but are not limited to silent renal damage, life-threatening infection, need for emergent surgery, failure to pass stone and pain.   For ureteroscopy I described the risks which include bleeding, infection, damage to contiguous structures, positioning injury, ureteral stricture, ureteral avulsion, ureteral injury, need for prolonged ureteral stent, inability to perform ureteroscopy, need for an interval procedure, inability to clear stone burden, stent discomfort/pain, heart attack, stroke, pulmonary embolus and the inherent risks with general anesthesia.   For shockwave lithotripsy I described the risks which include arrhythmia, kidney contusion, kidney hemorrhage, need for transfusion, pain, inability to adequately break up stone, inability to pass stone fragments, Steinstrasse, infection associated with obstructing stones, need for alternate surgical procedure, need for repeat shockwave lithotripsy, MI, CVA, PE and the inherent risks with anesthesia/conscious sedation.   For PCNL I described the risks including positioning injury, pneumothorax, hydrothorax, need for chest tube, inability to clear stone burden, renal laceration, arterial venous fistula or malformation, need for  embolization of kidney, loss of kidney or renal function, need for repeat procedure, need for prolonged nephrostomy  tube, ureteral avulsion, MI, CVA, PE and the inherent risks of general anesthesia.   - The patient would like to proceed with right ureteroscopic stone extraction - Urinalysis, Routine w reflex microscopic   No follow-ups on file.  Wilkie Aye, MD  [facility] [place]

## 2022-05-10 NOTE — H&P (View-Only) (Signed)
 05/10/2022 3:24 PM   Jasmyn R Marcucci 11/20/1960 [id number]  Referring provider: Fanta, Tesfaye Demissie, MD [address] [place],  [place] [address]  No chief complaint on file.   HPI:    PMH: Past Medical History:  Diagnosis Date   Anxiety    Arthritis    Asthma    Automatic implantable cardioverter-defibrillator in situ    2013, july   Bell palsy    states has had 3 episodes   Cardiomyopathy 08/2010   Presented with congestive heart failure; EF of 15% and 2012; hypotension on medication precludes optimal dosing   Chest pain 12/28/2017   CHF (congestive heart failure) (HCC)    a. EF 20% in 2012 with low-risk NST --> St. Jude ICD implantation by Dr. Taylor in 09/2011 b. NST in 12/2017 showing evidence of prior infarct without ischemia c. EF at 45-50% by echo in 06/2019   Chronic systolic heart failure (HCC)    COPD (chronic obstructive pulmonary disease) (HCC)    2013   Dysrhythmia    Gastroesophageal reflux disease    Headache(784.0)    Hot flashes 10/04/2016   Hyperlipidemia    Hypertension    09/2010-normal CMet and CBC; Lipid profile-116, 88, 25, 73   Hypothyroidism    Recent TSH was normal.   ICD (implantable cardiac defibrillator) in place 10/10/2011   Neuromuscular disorder (HCC)    Neuropathy, right foot and leg   Neuropathy    Obesity    Pneumonia    Seizures (HCC)    last one at age 62   Shortness of breath    Stroke (HCC)     stroke in 62/2014 and another in June, 62   Tobacco abuse    20 pack years    Surgical History: Past Surgical History:  Procedure Laterality Date   CESAREAN SECTION     X2   COLONOSCOPY N/A 12/16/2013   One simple adenoma and 3 hyperplastic polyps. Small internal hemorrhoids. Left colon redundant. Due for surveillance 2020.    COLONOSCOPY WITH PROPOFOL N/A 06/04/2019   Procedure: COLONOSCOPY WITH PROPOFOL;  Surgeon: Fields, Sandi L, MD;  Location: [facility];  Service: Endoscopy;  Laterality: N/A;   10:30am   CYSTOSCOPY W/ URETERAL STENT PLACEMENT Bilateral 03/01/2022   Procedure: CYSTOSCOPY WITH RETROGRADE PYELOGRAM/URETERAL STENT PLACEMENT;  Surgeon: Merlen Gurry L, MD;  Location: [facility];  Service: Urology;  Laterality: Bilateral;   EP IMPLANTABLE DEVICE     St. Jude   EP IMPLANTABLE DEVICE N/A 08/13/2015   Procedure: Loop Recorder Insertion;  Surgeon: Gregg W Taylor, MD;  Location: [facility];  Service: Cardiovascular;  Laterality: N/A;   IMPLANTABLE CARDIOVERTER DEFIBRILLATOR IMPLANT N/A 10/10/2011   Procedure: IMPLANTABLE CARDIOVERTER DEFIBRILLATOR IMPLANT;  Surgeon: Gregg W Taylor, MD;  Location: [facility];  Service: Cardiovascular;  Laterality: N/A;   MULTIPLE EXTRACTIONS WITH ALVEOLOPLASTY N/A 09/24/2012   Procedure: MULTIPLE EXTRACION #2, 4, 6, 7 ,8, 9, 11, 13, 18, 20, 21, 22, 23, 24, 25, 26, 27, 29 WITH ALVEOLOPLASTY, BIOPSY OF PALATE LESION, REMOVA RIGHT LINGUAL TORUS;  Surgeon: Scott M Jensen, DDS;  Location: [facility];  Service: Oral Surgery;  Laterality: N/A;   POLYPECTOMY  06/04/2019   Procedure: POLYPECTOMY;  Surgeon: Fields, Sandi L, MD;  Location: [facility];  Service: Endoscopy;;   TEE WITHOUT CARDIOVERSION N/A 07/11/2012   Procedure: TRANSESOPHAGEAL ECHOCARDIOGRAM (TEE);  Surgeon: Philip J Nahser, MD;  Location: [facility];  Service: Cardiovascular;  Laterality: N/A;     TUBAL LIGATION      Home Medications:  Allergies as of 05/10/2022       Reactions   Fish Allergy Anaphylaxis   Iodinated Contrast Media Hives, Other (See Comments)   Pulmonary problems; no frank respiratory arrest   Iodine Hives, Other (See Comments)   Pulmonary Problems    Lentil Anaphylaxis   Penicillins Anaphylaxis, Shortness Of Breath   Has patient had a PCN reaction causing immediate rash, facial/tongue/throat swelling, SOB or lightheadedness with hypotension: Yes Has patient had a PCN reaction causing severe rash involving mucus membranes or skin necrosis: No Has patient had a PCN  reaction that required hospitalization No Has patient had a PCN reaction occurring within the last 10 years: No If all of the above answers are "NO", then may proceed with Cephalosporin use. Hair loss   Lipitor [atorvastatin] Itching, Rash   Spironolactone Rash   Sulfa Antibiotics Rash   Irritates throat         Medication List        Accurate as of May 10, 2022  3:24 PM. If you have any questions, ask your nurse or doctor.          acetaminophen 650 MG CR tablet Commonly known as: TYLENOL Take 650 mg by mouth at bedtime.   albuterol 108 (90 Base) MCG/ACT inhaler Commonly known as: VENTOLIN HFA Inhale 2 puffs into the lungs every 6 (six) hours as needed for wheezing or shortness of breath.   aspirin EC 81 MG tablet Take 1 tablet (81 mg total) by mouth daily.   Biotin 5000 MCG Caps Take 15,000 mcg by mouth daily.   budesonide-formoterol 80-4.5 MCG/ACT inhaler Commonly known as: SYMBICORT Inhale 2 puffs into the lungs 2 (two) times daily.   carvedilol 12.5 MG tablet Commonly known as: COREG Take 25 mg by mouth 2 (two) times daily with a meal.   CENTRAVITES 50 PLUS PO Take 1 tablet by mouth daily.   cetirizine 10 MG tablet Commonly known as: ZYRTEC Take 10 mg by mouth daily.   clobetasol 0.05 % external solution Commonly known as: TEMOVATE Apply 1 application  topically 2 (two) times daily as needed (psoriasis). Apply to scalp   clopidogrel 75 MG tablet Commonly known as: PLAVIX Take 1 tablet (75 mg total) by mouth daily.   cyclobenzaprine 10 MG tablet Commonly known as: FLEXERIL Take 10 mg by mouth at bedtime as needed for muscle spasms.   Entresto 24-26 MG Generic drug: sacubitril-valsartan TAKE ONE TABLET BY MOUTH 2 TIMES A DAY   famotidine 20 MG tablet Commonly known as: PEPCID Take 20 mg by mouth daily as needed for heartburn or indigestion.   ferrous sulfate 325 (65 FE) MG EC tablet Take 325 mg by mouth daily.   fluticasone 50 MCG/ACT  nasal spray Commonly known as: FLONASE Place 2 sprays into both nostrils daily.   furosemide 40 MG tablet Commonly known as: LASIX Take 1 tablet (40 mg total) by mouth 2 (two) times daily.   ketoconazole 2 % cream Commonly known as: NIZORAL Apply 1 application  topically 2 (two) times daily as needed (psoriasis).   levothyroxine 200 MCG tablet Commonly known as: SYNTHROID Take 1 tablet (200 mcg total) by mouth daily before breakfast.   levothyroxine 175 MCG tablet Commonly known as: SYNTHROID Take 175 mcg by mouth every morning.   ondansetron 4 MG tablet Commonly known as: Zofran Take 1 tablet (4 mg total) by mouth daily as needed for nausea or vomiting.     oxyCODONE-acetaminophen 5-325 MG tablet Commonly known as: PERCOCET/ROXICET TAKE ONE TABLET BY MOUTH EVERY 4 HOURS AS NEEDED   pantoprazole 20 MG tablet Commonly known as: PROTONIX Take 1 tablet (20 mg total) by mouth daily.   potassium chloride SA 20 MEQ tablet Commonly known as: KLOR-CON M Take 20 mEq by mouth daily.   rosuvastatin 20 MG tablet Commonly known as: CRESTOR Take 20 mg by mouth daily.   topiramate 100 MG tablet Commonly known as: TOPAMAX TAKE ONE TABLET BY MOUTH AT BEDTIME   Ubrelvy 50 MG Tabs Generic drug: Ubrogepant Take 50 mg by mouth as needed. Can repeat a dose in 2 hours if headache persists   Vitamin D (Ergocalciferol) 1.25 MG (50000 UNIT) Caps capsule Commonly known as: DRISDOL Take 50,000 Units by mouth every 30 (thirty) days.        Allergies:  Allergies  Allergen Reactions   Fish Allergy Anaphylaxis   Iodinated Contrast Media Hives and Other (See Comments)    Pulmonary problems; no frank respiratory arrest   Iodine Hives and Other (See Comments)    Pulmonary Problems    Lentil Anaphylaxis   Penicillins Anaphylaxis and Shortness Of Breath    Has patient had a PCN reaction causing immediate rash, facial/tongue/throat swelling, SOB or lightheadedness with hypotension: Yes Has  patient had a PCN reaction causing severe rash involving mucus membranes or skin necrosis: No Has patient had a PCN reaction that required hospitalization No Has patient had a PCN reaction occurring within the last 10 years: No If all of the above answers are "NO", then may proceed with Cephalosporin use.  Hair loss   Lipitor [Atorvastatin] Itching and Rash   Spironolactone Rash   Sulfa Antibiotics Rash    Irritates throat     Family History: Family History  Problem Relation Age of Onset   Cardiomyopathy Mother        ICD pacemaker-ischmic CM   Heart failure Mother    Hypertension Mother    Diabetes Mother    Thyroid disease Mother    Cardiomyopathy Father        Deceased   Coronary artery disease Father    Heart failure Father    Heart failure Brother    Hypertension Brother    Endometriosis Sister    Heart disease Paternal Grandfather    Heart disease Paternal Grandmother    Heart disease Maternal Grandmother    Heart disease Maternal Grandfather    Hypertension Daughter    Obesity Daughter    Colon cancer Neg Hx    Liver disease Neg Hx     Social History:  reports that she has been smoking cigarettes. She started smoking about 38 years ago. She has a 15.75 pack-year smoking history. She has never used smokeless tobacco. She reports that she does not drink alcohol and does not use drugs.  ROS: All other review of systems were reviewed and are negative except what is noted above in HPI  Physical Exam: BP 124/82   Pulse 93   Constitutional:  Alert and oriented, No acute distress. HEENT: [place] AT, moist mucus membranes.  Trachea midline, no masses. Cardiovascular: No clubbing, cyanosis, or edema. Respiratory: Normal respiratory effort, no increased work of breathing. GI: Abdomen is soft, nontender, nondistended, no abdominal masses GU: No CVA tenderness.  Lymph: No cervical or inguinal lymphadenopathy. Skin: No rashes, bruises or suspicious lesions. Neurologic:  Grossly intact, no focal deficits, moving all 4 extremities. Psychiatric: Normal mood and affect.  Laboratory Data: Lab   Results  Component Value Date   WBC 12.0 (H) 03/02/2022   HGB 11.7 (L) 03/02/2022   HCT 36.5 03/02/2022   MCV 92.6 03/02/2022   PLT 183 03/02/2022    Lab Results  Component Value Date   CREATININE 0.99 03/02/2022    No results found for: "PSA"  No results found for: "TESTOSTERONE"  Lab Results  Component Value Date   HGBA1C 5.7 (H) 07/03/2019    Urinalysis    Component Value Date/Time   COLORURINE AMBER (A) 02/28/2022 1615   APPEARANCEUR Cloudy (A) 03/15/2022 1522   LABSPEC 1.013 02/28/2022 1615   PHURINE 7.0 02/28/2022 1615   GLUCOSEU Negative 03/15/2022 1522   HGBUR MODERATE (A) 02/28/2022 1615   BILIRUBINUR Negative 03/15/2022 1522   KETONESUR NEGATIVE 02/28/2022 1615   PROTEINUR Trace 03/15/2022 1522   PROTEINUR >=300 (A) 02/28/2022 1615   UROBILINOGEN 0.2 04/29/2013 1702   NITRITE Negative 03/15/2022 1522   NITRITE POSITIVE (A) 02/28/2022 1615   LEUKOCYTESUR 2+ (A) 03/15/2022 1522   LEUKOCYTESUR LARGE (A) 02/28/2022 1615    Lab Results  Component Value Date   LABMICR See below: 03/15/2022   WBCUA 6-10 (A) 03/15/2022   LABEPIT 0-10 03/15/2022   BACTERIA Few 03/15/2022    Pertinent Imaging: *** No results found for this or any previous visit.  No results found for this or any previous visit.  No results found for this or any previous visit.  No results found for this or any previous visit.  No results found for this or any previous visit.  No valid procedures specified. No results found for this or any previous visit.  No results found for this or any previous visit.   Assessment & Plan:    1. Right renal calculus -We discussed the management of kidney stones. These options include observation, ureteroscopy, shockwave lithotripsy (ESWL) and percutaneous nephrolithotomy (PCNL). We discussed which options are relevant to  the patient's stone(s). We discussed the natural history of kidney stones as well as the complications of untreated stones and the impact on quality of life without treatment as well as with each of the above listed treatments. We also discussed the efficacy of each treatment in its ability to clear the stone burden. With any of these management options I discussed the signs and symptoms of infection and the need for emergent treatment should these be experienced. For each option we discussed the ability of each procedure to clear the patient of their stone burden.   For observation I described the risks which include but are not limited to silent renal damage, life-threatening infection, need for emergent surgery, failure to pass stone and pain.   For ureteroscopy I described the risks which include bleeding, infection, damage to contiguous structures, positioning injury, ureteral stricture, ureteral avulsion, ureteral injury, need for prolonged ureteral stent, inability to perform ureteroscopy, need for an interval procedure, inability to clear stone burden, stent discomfort/pain, heart attack, stroke, pulmonary embolus and the inherent risks with general anesthesia.   For shockwave lithotripsy I described the risks which include arrhythmia, kidney contusion, kidney hemorrhage, need for transfusion, pain, inability to adequately break up stone, inability to pass stone fragments, Steinstrasse, infection associated with obstructing stones, need for alternate surgical procedure, need for repeat shockwave lithotripsy, MI, CVA, PE and the inherent risks with anesthesia/conscious sedation.   For PCNL I described the risks including positioning injury, pneumothorax, hydrothorax, need for chest tube, inability to clear stone burden, renal laceration, arterial venous fistula or malformation, need for   embolization of kidney, loss of kidney or renal function, need for repeat procedure, need for prolonged nephrostomy  tube, ureteral avulsion, MI, CVA, PE and the inherent risks of general anesthesia.   - The patient would like to proceed with right ureteroscopic stone extraction - Urinalysis, Routine w reflex microscopic   No follow-ups on file.  Kiaraliz Rafuse, MD  [facility] [place]   

## 2022-05-11 LAB — URINALYSIS, ROUTINE W REFLEX MICROSCOPIC
Bilirubin, UA: NEGATIVE
Glucose, UA: NEGATIVE
Nitrite, UA: NEGATIVE
Specific Gravity, UA: 1.02 (ref 1.005–1.030)
Urobilinogen, Ur: 0.2 mg/dL (ref 0.2–1.0)
pH, UA: 7 (ref 5.0–7.5)

## 2022-05-11 LAB — MICROSCOPIC EXAMINATION: RBC, Urine: >30 /hpf — AB (ref 0–2)

## 2022-05-12 ENCOUNTER — Encounter: Payer: Self-pay | Admitting: Urology

## 2022-05-12 LAB — CALCULI, WITH PHOTOGRAPH (CLINICAL LAB)
Carbonate Apatite: 40 %
Mg NH4 PO4 (Struvite): 60 %
Weight Calculi: 73 mg

## 2022-05-12 NOTE — Patient Instructions (Signed)

## 2022-05-13 ENCOUNTER — Telehealth: Payer: Self-pay

## 2022-05-13 NOTE — Telephone Encounter (Signed)
Patient needing a call back regarding surgery being scheduled.  Please advise.

## 2022-05-13 NOTE — Telephone Encounter (Signed)
I spoke with Melanie Cordova. We have discussed possible surgery dates and 06/09/2022 was agreed upon by all parties. Patient given information about surgery date, what to expect pre-operatively and post operatively.    We discussed that a pre-op nurse will be calling to set up the pre-op visit that will take place prior to surgery. Informed patient that our office will communicate any additional care to be provided after surgery.    Patients questions or concerns were discussed during our call. Advised to call our office should there be any additional information, questions or concerns that arise. Patient verbalized understanding.    Patient understands to hold plavix 5 days prior to surgery.

## 2022-05-20 NOTE — Progress Notes (Deleted)
Referring Provider:*** Primary Care Physician:  Carrolyn Meiers, MD Primary Gastroenterologist:  Dr. Abbey Chatters (Dr. Oneida Alar previously)  No chief complaint on file.   HPI:   Melanie Cordova is a 62 y.o. female with GI history of GERD, constipation, and multiple adenomatous colon polyps, presenting today to discuss scheduling surveillance colonoscopy. Last colonoscopy 06/04/2019: six 4-8 mm polyps in the colon resected and retrieved, external and internal hemorrhoids, tortuous colon.  Pathology with multiple frag ends of sessile serrated polyp and tubular adenoma.  Recommended repeat colonoscopy in 3 years.  Medical history significant for stroke on Plavix***, HTN, HLD, CHF with ICD in place with subsequent improvement in EF, COPD  Today:    Past Medical History:  Diagnosis Date   Anxiety    Arthritis    Asthma    Automatic implantable cardioverter-defibrillator in situ    2013, july   Bell palsy    states has had 3 episodes   Cardiomyopathy 08/2010   Presented with congestive heart failure; EF of 15% and 2012; hypotension on medication precludes optimal dosing   Chest pain 12/28/2017   CHF (congestive heart failure) ([place])    a. EF 20% in 2012 with low-risk NST --> St. Jude ICD implantation by Dr. Lovena Le in 09/2011 b. NST in 12/2017 showing evidence of prior infarct without ischemia c. EF at 45-50% by echo in [id number]   Chronic systolic heart failure ([place])    COPD (chronic obstructive pulmonary disease) ([place])    2013   Dysrhythmia    Gastroesophageal reflux disease    Headache(784.0)    Hot flashes 10/04/2016   Hyperlipidemia    Hypertension    09/2010-normal CMet and CBC; Lipid profile-116, 88, 25, 73   Hypothyroidism    Recent TSH was normal.   ICD (implantable cardiac defibrillator) in place 10/10/2011   Neuromuscular disorder (HCC)    Neuropathy, right foot and leg   Neuropathy    Obesity    Pneumonia    Seizures ([place])    last one at age 29   Shortness of  breath    Stroke ([place])     stroke in 07/2012 and another in June, 2014   Tobacco abuse    20 pack years    Past Surgical History:  Procedure Laterality Date   CESAREAN SECTION     X2   COLONOSCOPY N/A 12/16/2013   One simple adenoma and 3 hyperplastic polyps. Small internal hemorrhoids. Left colon redundant. Due for surveillance 2020.    COLONOSCOPY WITH PROPOFOL N/A 06/04/2019   Procedure: COLONOSCOPY WITH PROPOFOL;  Surgeon: Danie Binder, MD;  Location: [facility];  Service: Endoscopy;  Laterality: N/A;  10:30am   CYSTOSCOPY W/ URETERAL STENT PLACEMENT Bilateral 03/01/2022   Procedure: CYSTOSCOPY WITH RETROGRADE PYELOGRAM/URETERAL STENT PLACEMENT;  Surgeon: Cleon Gustin, MD;  Location: [facility];  Service: Urology;  Laterality: Bilateral;   CYSTOSCOPY WITH RETROGRADE PYELOGRAM, URETEROSCOPY AND STENT PLACEMENT Left 05/05/2022   Procedure: CYSTOSCOPY WITH RETROGRADE PYELOGRAM, URETEROSCOPY AND STENT EXCHANGE;  Surgeon: Cleon Gustin, MD;  Location: [facility];  Service: Urology;  Laterality: Left;   EP IMPLANTABLE DEVICE     St. Jude   EP IMPLANTABLE DEVICE N/A 08/13/2015   Procedure: Loop Recorder Insertion;  Surgeon: Evans Lance, MD;  Location: [facility];  Service: Cardiovascular;  Laterality: N/A;   HOLMIUM LASER APPLICATION Left [id number]   Procedure: HOLMIUM LASER APPLICATION;  Surgeon: Cleon Gustin, MD;  Location: [facility];  Service:  Urology;  Laterality: Left;   IMPLANTABLE CARDIOVERTER DEFIBRILLATOR IMPLANT N/A 10/10/2011   Procedure: IMPLANTABLE CARDIOVERTER DEFIBRILLATOR IMPLANT;  Surgeon: Evans Lance, MD;  Location: [facility] C[facility];  Service: Cardiovascular;  Laterality: N/A;   MULTIPLE EXTRACTIONS WITH ALVEOLOPLASTY N/A 09/24/2012   Procedure: MULTIPLE EXTRACION #2, 4, 6, 7 ,8, 9, 11, 13, 18, 20, 21, 22, 23, 24, 25, 26, 27, 29 WITH ALVEOLOPLASTY, BIOPSY OF PALATE LESION, REMOVA RIGHT LINGUAL TORUS;  Surgeon: Gae Bon, DDS;  Location: [facility];  Service:  Oral Surgery;  Laterality: N/A;   POLYPECTOMY  06/04/2019   Procedure: POLYPECTOMY;  Surgeon: Danie Binder, MD;  [facility];  Service: Endoscopy;;   TEE WITHOUT CARDIOVERSION N/A 07/11/2012   Procedure: TRANSESOPHAGEAL ECHOCARDIOGRAM (TEE);  Surgeon: Thayer Headings, MD;  Location: [facility] ENDOS[facility];  Service: Cardiovascular;  Laterality: N/A;   TUBAL LIGATION      Current Outpatient Medications  Medication Sig Dispense Refill   acetaminophen (TYLENOL) 650 MG CR tablet Take 650 mg by mouth at bedtime.     albuterol (PROVENTIL HFA;VENTOLIN HFA) 108 (90 BASE) MCG/ACT inhaler Inhale 2 puffs into the lungs every 6 (six) hours as needed for wheezing or shortness of breath. 1 Inhaler 5   aspirin EC 81 MG tablet Take 1 tablet (81 mg total) by mouth daily. 21 tablet 0   Biotin 5000 MCG CAPS Take 15,000 mcg by mouth daily.     budesonide-formoterol (SYMBICORT) 80-4.5 MCG/ACT inhaler Inhale 2 puffs into the lungs 2 (two) times daily.     carvedilol (COREG) 12.5 MG tablet Take 25 mg by mouth 2 (two) times daily with a meal.     cetirizine (ZYRTEC) 10 MG tablet Take 10 mg by mouth daily.     clobetasol (TEMOVATE) 0.05 % external solution Apply 1 application  topically 2 (two) times daily as needed (psoriasis). Apply to scalp     clopidogrel (PLAVIX) 75 MG tablet Take 1 tablet (75 mg total) by mouth daily. 21 tablet 0   cyclobenzaprine (FLEXERIL) 10 MG tablet Take 10 mg by mouth at bedtime as needed for muscle spasms.     ENTRESTO 24-26 MG TAKE ONE TABLET BY MOUTH 2 TIMES A DAY 60 tablet 0   famotidine (PEPCID) 20 MG tablet Take 20 mg by mouth daily as needed for heartburn or indigestion.     ferrous sulfate 325 (65 FE) MG EC tablet Take 325 mg by mouth daily.     fluticasone (FLONASE) 50 MCG/ACT nasal spray Place 2 sprays into both nostrils daily.     furosemide (LASIX) 40 MG tablet Take 1 tablet (40 mg total) by mouth 2 (two) times daily. 180 tablet 0   ketoconazole (NIZORAL) 2 % cream Apply 1  application  topically 2 (two) times daily as needed (psoriasis).     levothyroxine (SYNTHROID) 175 MCG tablet Take 175 mcg by mouth every morning.     levothyroxine (SYNTHROID, LEVOTHROID) 200 MCG tablet Take 1 tablet (200 mcg total) by mouth daily before breakfast. (Patient not taking: Reported on 05/03/2022) 90 tablet 2   Multiple Vitamins-Minerals (CENTRAVITES 50 PLUS PO) Take 1 tablet by mouth daily.      ondansetron (ZOFRAN) 4 MG tablet Take 1 tablet (4 mg total) by mouth daily as needed for nausea or vomiting. 30 tablet 1   oxyCODONE-acetaminophen (PERCOCET/ROXICET) 5-325 MG tablet TAKE ONE TABLET BY MOUTH EVERY 4 HOURS AS NEEDED 30 tablet 0   pantoprazole (PROTONIX) 20 MG tablet Take 1 tablet (20  mg total) by mouth daily. (Patient not taking: Reported on 05/03/2022) 90 tablet 1   potassium chloride SA (K-DUR) 20 MEQ tablet Take 20 mEq by mouth daily.      rosuvastatin (CRESTOR) 20 MG tablet Take 20 mg by mouth daily.     topiramate (TOPAMAX) 100 MG tablet TAKE ONE TABLET BY MOUTH AT BEDTIME 90 tablet 2   Ubrogepant (UBRELVY) 50 MG TABS Take 50 mg by mouth as needed. Can repeat a dose in 2 hours if headache persists 16 tablet 2   Vitamin D, Ergocalciferol, (DRISDOL) 1.25 MG (50000 UNIT) CAPS capsule Take 50,000 Units by mouth every 30 (thirty) days.     No current facility-administered medications for this visit.    Allergies as of 05/23/2022 - Review Complete 05/12/2022  Allergen Reaction Noted   Fish allergy Anaphylaxis 03/20/2012   Iodinated contrast media Hives and Other (See Comments) 09/24/2010   Iodine Hives and Other (See Comments) 12/23/2010   Lentil Anaphylaxis 03/20/2012   Penicillins Anaphylaxis and Shortness Of Breath 05/04/2010   Lipitor [atorvastatin] Itching and Rash 07/27/2012   Spironolactone Rash 07/11/2011   Sulfa antibiotics Rash 05/04/2010    Family History  Problem Relation Age of Onset   Cardiomyopathy Mother        ICD pacemaker-ischmic CM   Heart failure  Mother    Hypertension Mother    Diabetes Mother    Thyroid disease Mother    Cardiomyopathy Father        Deceased   Coronary artery disease Father    Heart failure Father    Heart failure Brother    Hypertension Brother    Endometriosis Sister    Heart disease Paternal Grandfather    Heart disease Paternal Grandmother    Heart disease Maternal Grandmother    Heart disease Maternal Grandfather    Hypertension Daughter    Obesity Daughter    Colon cancer Neg Hx    Liver disease Neg Hx     Social History   Socioeconomic History   Marital status: Divorced    Spouse name: Not on file   Number of children: 2   Years of education: Not on file   Highest education level: Not on file  Occupational History    Employer: [employer]    Comment: Works in Environmental consultant  Tobacco Use   Smoking status: Every Day    Packs/day: 0.75    Years: 21.00    Total pack years: 15.75    Types: Cigarettes    Start date: 07/02/1983   Smokeless tobacco: Never  Vaping Use   Vaping Use: Former  Substance and Sexual Activity   Alcohol use: No    Alcohol/week: 0.0 standard drinks of alcohol   Drug use: No   Sexual activity: Not Currently    Birth control/protection: Abstinence, Surgical    Comment: tubal  Other Topics Concern   Not on file  Social History Narrative   Not on file   Social Determinants of Health   Financial Resource Strain: Not on file  Food Insecurity: No Food Insecurity (03/01/2022)   Hunger Vital Sign    Worried About Running Out of Food in the Last Year: Never true    Ran Out of Food in the Last Year: Never true  Transportation Needs: No Transportation Needs (03/01/2022)   PRAPARE - Hydrologist (Medical): No    Lack of Transportation (Non-Medical): No  Physical Activity: Not on file  Stress: Not on file  Social Connections: Not on file  Intimate Partner Violence: Not At Risk (03/01/2022)   Humiliation, Afraid, Rape, and Kick  questionnaire    Fear of Current or Ex-Partner: No    Emotionally Abused: No    Physically Abused: No    Sexually Abused: No    Review of Systems: Gen: Denies any fever, chills, fatigue, weight loss, lack of appetite.  CV: Denies chest pain, heart palpitations, peripheral edema, syncope.  Resp: Denies shortness of breath at rest or with exertion. Denies wheezing or cough.  GI: Denies dysphagia or odynophagia. Denies jaundice, hematemesis, fecal incontinence. GU : Denies urinary burning, urinary frequency, urinary hesitancy MS: Denies joint pain, muscle weakness, cramps, or limitation of movement.  Derm: Denies rash, itching, dry skin Psych: Denies depression, anxiety, memory loss, and confusion Heme: Denies bruising, bleeding, and enlarged lymph nodes.  Physical Exam: There were no vitals taken for this visit. General:   Alert and oriented. Pleasant and cooperative. Well-nourished and well-developed.  Head:  Normocephalic and atraumatic. Eyes:  Without icterus, sclera clear and conjunctiva pink.  Ears:  Normal auditory acuity. Lungs:  Clear to auscultation bilaterally. No wheezes, rales, or rhonchi. No distress.  Heart:  S1, S2 present without murmurs appreciated.  Abdomen:  +BS, soft, non-tender and non-distended. No HSM noted. No guarding or rebound. No masses appreciated.  Rectal:  Deferred  Msk:  Symmetrical without gross deformities. Normal posture. Extremities:  Without edema. Neurologic:  Alert and  oriented x4;  grossly normal neurologically. Skin:  Intact without significant lesions or rashes. Psych:  Alert and cooperative. Normal mood and affect.    Assessment:     Plan:  ***   Aliene Altes, PA-C [facility] 05/23/2022

## 2022-05-23 ENCOUNTER — Ambulatory Visit: Payer: 59 | Admitting: Gastroenterology

## 2022-05-30 NOTE — Progress Notes (Unsigned)
GI Office Note    Referring Provider: Carrolyn Meiers* Primary Care Physician:  Carrolyn Meiers, MD  Primary Gastroenterologist: Cristopher Estimable.Rourk, MD, Previously Dr. Oneida Alar  Chief Complaint   Chief Complaint  Patient presents with   Need for a TCS    Patient here today says she is in need of a Tcs, her last one was done on 06/04/2019 and the one before that was done 12/17/2013.Patient has noticed dark and bright red blood in stools at times. She says she has to evacuate her stools digitally at times, as she says she occasionally has constipation. She is not on any anti constipation medications,but tries to eat right.     History of Present Illness   Melanie Cordova is a 62 y.o. female presenting today at the request of Fanta, Tesfaye Demissie* for evaluation prior to scheduling surveillance colonoscopy.  Colonoscopy in 2015 with 1 cecal adenoma and 3 hyperplastic polyps.  Small internal hemorrhoids.  Redundant left colon.  Last office visit 02/20/2019.  That she would need propofol due to for polypharmacy.  Reported she will go several days without a bowel movement.  Taking Metamucil 2 tablespoons twice daily.  Positive flatus.  Denies any rectal bleeding.  Rare reflux, no dysphagia, lack of appetite, weight loss.  Taking Protonix once daily.  Advised to start Linzess 1 4 5  mcg once daily, 3 months prior to breakfast.  Samples provided.  Scheduled for colonoscopy.  Advised to hold iron for 7 days prior.  Colonoscopy March 2021: -6 sessile polyps in the descending, mid transverse, distal transverse, and ascending colon.  Polyps ranging 4-8 mm in size. -External and internal hemorrhoids -Mildly tortuous rectosigmoid and sigmoid colon -Ascending follow-up revealed to be sessile serrated polyp -Transverse and descending polyps revealed to be tubular adenomas without high-grade dysplasia -Advised follow-up in 3 years  Last lab work with Hgb 11.7 on 03/02/22. MCV  92.6.  Echo March 2023 with EF 40 to 45%.  Today: Constipation - Has to digitally disimpact herself. Sometimes has a lot that comes out and sometimes it does not. Has a BM about everyday or sometimes more frequent but usually very small amounts. Used to use corectal and has not used any otc regimens due to fear of interacting with her other medications. Has Been Unable to Take It As It Made HER Hair Fall out.  Reports her PCP took her off PPI at her last visit. Denies any reflux symptoms. Reports rare instances likely related to diet choices. No dysphagia, N/V.   Has had dark stools at times and bright red stools at times. Has been on iron for a long time. Mostly has bright red in the commode when she urinates. Has a good appetite. No early satiety. No diarrhea.   Reports she lost about 85lbs in the last year or so. Repots she has been eating the same. She does report a lot of stress however.    She states that her urologist told her that greens and kales and drinking tea caused her kidney stones. She feels as though it is the city water.   Reports she had kidney surgery and states she had kidney stones removed on the left but on the right she had lithotripsy and had a stent placed. Due to go back March 28 top have that removed. Still having kidney stones that comes out.   Repots ICD not working and too risky to remove. Denies chest pain or shortness of breath. No edema as  long as she stays away from salt. Cooks everything from scratch.    Current Outpatient Medications  Medication Sig Dispense Refill   acetaminophen (TYLENOL) 650 MG CR tablet Take 650 mg by mouth at bedtime.     albuterol (PROVENTIL HFA;VENTOLIN HFA) 108 (90 BASE) MCG/ACT inhaler Inhale 2 puffs into the lungs every 6 (six) hours as needed for wheezing or shortness of breath. 1 Inhaler 5   aspirin EC 81 MG tablet Take 1 tablet (81 mg total) by mouth daily. 21 tablet 0   Biotin 5000 MCG CAPS Take 15,000 mcg by mouth daily.      budesonide-formoterol (SYMBICORT) 80-4.5 MCG/ACT inhaler Inhale 2 puffs into the lungs 2 (two) times daily.     carvedilol (COREG) 12.5 MG tablet Take 25 mg by mouth 2 (two) times daily with a meal.     cetirizine (ZYRTEC) 10 MG tablet Take 10 mg by mouth daily.     clobetasol (TEMOVATE) 0.05 % external solution Apply 1 application  topically 2 (two) times daily as needed (psoriasis). Apply to scalp     clopidogrel (PLAVIX) 75 MG tablet Take 1 tablet (75 mg total) by mouth daily. 21 tablet 0   cyclobenzaprine (FLEXERIL) 10 MG tablet Take 10 mg by mouth at bedtime as needed for muscle spasms.     ENTRESTO 24-26 MG TAKE ONE TABLET BY MOUTH 2 TIMES A DAY 60 tablet 0   famotidine (PEPCID) 20 MG tablet Take 20 mg by mouth daily as needed for heartburn or indigestion.     ferrous sulfate 325 (65 FE) MG EC tablet Take 325 mg by mouth daily.     fluticasone (FLONASE) 50 MCG/ACT nasal spray Place 2 sprays into both nostrils daily.     furosemide (LASIX) 40 MG tablet Take 1 tablet (40 mg total) by mouth 2 (two) times daily. 180 tablet 0   ketoconazole (NIZORAL) 2 % cream Apply 1 application  topically 2 (two) times daily as needed (psoriasis).     levothyroxine (SYNTHROID) 175 MCG tablet Take 175 mcg by mouth every morning.     Multiple Vitamins-Minerals (CENTRAVITES 50 PLUS PO) Take 1 tablet by mouth daily.      ondansetron (ZOFRAN) 4 MG tablet Take 1 tablet (4 mg total) by mouth daily as needed for nausea or vomiting. 30 tablet 1   oxyCODONE-acetaminophen (PERCOCET/ROXICET) 5-325 MG tablet TAKE ONE TABLET BY MOUTH EVERY 4 HOURS AS NEEDED 30 tablet 0   potassium chloride SA (K-DUR) 20 MEQ tablet Take 20 mEq by mouth daily.      rosuvastatin (CRESTOR) 20 MG tablet Take 20 mg by mouth daily.     topiramate (TOPAMAX) 100 MG tablet TAKE ONE TABLET BY MOUTH AT BEDTIME 90 tablet 2   Ubrogepant (UBRELVY) 50 MG TABS Take 50 mg by mouth as needed. Can repeat a dose in 2 hours if headache persists 16 tablet 2    Vitamin D, Ergocalciferol, (DRISDOL) 1.25 MG (50000 UNIT) CAPS capsule Take 50,000 Units by mouth every 30 (thirty) days.     No current facility-administered medications for this visit.    Past Medical History:  Diagnosis Date   Anxiety    Arthritis    Asthma    Automatic implantable cardioverter-defibrillator in situ    2013, july   Bell palsy    states has had 3 episodes   Cardiomyopathy 08/2010   Presented with congestive heart failure; EF of 15% and 2012; hypotension on medication precludes optimal dosing   Chest pain  12/28/2017   CHF (congestive heart failure) ([place])    a. EF 20% in 2012 with low-risk NST --> St. Jude ICD implantation by Dr. Lovena Le in 09/2011 b. NST in 12/2017 showing evidence of prior infarct without ischemia c. EF at 45-50% by echo in [id number]   Chronic systolic heart failure ([place])    COPD (chronic obstructive pulmonary disease) ([place])    2013   Dysrhythmia    Gastroesophageal reflux disease    Headache(784.0)    Hot flashes 10/04/2016   Hyperlipidemia    Hypertension    09/2010-normal CMet and CBC; Lipid profile-116, 88, 25, 73   Hypothyroidism    Recent TSH was normal.   ICD (implantable cardiac defibrillator) in place 10/10/2011   Neuromuscular disorder (HCC)    Neuropathy, right foot and leg   Neuropathy    Obesity    Pneumonia    Seizures ([place])    last one at age 58   Shortness of breath    Stroke ([place])     stroke in 07/2012 and another in June, 2014   Tobacco abuse    20 pack years    Past Surgical History:  Procedure Laterality Date   CESAREAN SECTION     X2   COLONOSCOPY N/A 12/16/2013   One simple adenoma and 3 hyperplastic polyps. Small internal hemorrhoids. Left colon redundant. Due for surveillance 2020.    COLONOSCOPY WITH PROPOFOL N/A 06/04/2019   Procedure: COLONOSCOPY WITH PROPOFOL;  Surgeon: Danie Binder, MD;  Location: [facility];  Service: Endoscopy;  Laterality: N/A;  10:30am   CYSTOSCOPY W/ URETERAL STENT PLACEMENT  Bilateral 03/01/2022   Procedure: CYSTOSCOPY WITH RETROGRADE PYELOGRAM/URETERAL STENT PLACEMENT;  Surgeon: Cleon Gustin, MD;  Location: [facility];  Service: Urology;  Laterality: Bilateral;   CYSTOSCOPY WITH RETROGRADE PYELOGRAM, URETEROSCOPY AND STENT PLACEMENT Left 05/05/2022   Procedure: CYSTOSCOPY WITH RETROGRADE PYELOGRAM, URETEROSCOPY AND STENT EXCHANGE;  Surgeon: Cleon Gustin, MD;  Location: [facility];  Service: Urology;  Laterality: Left;   EP IMPLANTABLE DEVICE     St. Jude   EP IMPLANTABLE DEVICE N/A 08/13/2015   Procedure: Loop Recorder Insertion;  Surgeon: Evans Lance, MD;  Location: [facility];  Service: Cardiovascular;  Laterality: N/A;   HOLMIUM LASER APPLICATION Left [id number]   Procedure: HOLMIUM LASER APPLICATION;  Surgeon: Cleon Gustin, MD;  Location: [facility];  Service: Urology;  Laterality: Left;   IMPLANTABLE CARDIOVERTER DEFIBRILLATOR IMPLANT N/A 10/10/2011   Procedure: IMPLANTABLE CARDIOVERTER DEFIBRILLATOR IMPLANT;  Surgeon: Evans Lance, MD;  Location: [facility];  Service: Cardiovascular;  Laterality: N/A;   MULTIPLE EXTRACTIONS WITH ALVEOLOPLASTY N/A 09/24/2012   Procedure: MULTIPLE EXTRACION #2, 4, 6, 7 ,8, 9, 11, 13, 18, 20, 21, 22, 23, 24, 25, 26, 27, 29 WITH ALVEOLOPLASTY, BIOPSY OF PALATE LESION, REMOVA RIGHT LINGUAL TORUS;  Surgeon: Gae Bon, DDS;  Location: [facility];  Service: Oral Surgery;  Laterality: N/A;   POLYPECTOMY  06/04/2019   Procedure: POLYPECTOMY;  Surgeon: Danie Binder, MD;  Location: [facility];  Service: Endoscopy;;   TEE WITHOUT CARDIOVERSION N/A 07/11/2012   Procedure: TRANSESOPHAGEAL ECHOCARDIOGRAM (TEE);  Surgeon: Thayer Headings, MD;  Location[facility] END[facility];  Service: Cardiovascular;  Laterality: N/A;   TUBAL LIGATION      Family History  Problem Relation Age of Onset   Cardiomyopathy Mother        ICD pacemaker-ischmic CM   Heart failure Mother    Hypertension Mother  Diabetes Mother    Thyroid disease  Mother    Cardiomyopathy Father        Deceased   Coronary artery disease Father    Heart failure Father    Heart failure Brother    Hypertension Brother    Endometriosis Sister    Heart disease Paternal Grandfather    Heart disease Paternal Grandmother    Heart disease Maternal Grandmother    Heart disease Maternal Grandfather    Hypertension Daughter    Obesity Daughter    Colon cancer Neg Hx    Liver disease Neg Hx     Allergies as of 05/31/2022 - Review Complete 05/31/2022  Allergen Reaction Noted   Fish allergy Anaphylaxis 03/20/2012   Iodinated contrast media Hives and Other (See Comments) 09/24/2010   Iodine Hives and Other (See Comments) 12/23/2010   Lentil Anaphylaxis 03/20/2012   Penicillins Anaphylaxis and Shortness Of Breath 05/04/2010   Lipitor [atorvastatin] Itching and Rash 07/27/2012   Spironolactone Rash 07/11/2011   Sulfa antibiotics Rash 05/04/2010    Social History   Socioeconomic History   Marital status: Divorced    Spouse name: Not on file   Number of children: 2   Years of education: Not on file   Highest education level: Not on file  Occupational History    Employer: [employer]    Comment: Works in Environmental consultant  Tobacco Use   Smoking status: Every Day    Packs/day: 0.75    Years: 21.00    Additional pack years: 0.00    Total pack years: 15.75    Types: Cigarettes    Start date: 07/02/1983   Smokeless tobacco: Never  Vaping Use   Vaping Use: Former  Substance and Sexual Activity   Alcohol use: No    Alcohol/week: 0.0 standard drinks of alcohol   Drug use: No   Sexual activity: Not Currently    Birth control/protection: Abstinence, Surgical    Comment: tubal  Other Topics Concern   Not on file  Social History Narrative   Not on file   Social Determinants of Health   Financial Resource Strain: Not on file  Food Insecurity: No Food Insecurity (03/01/2022)   Hunger Vital Sign    Worried About Running Out of Food in the  Last Year: Never true    Ran Out of Food in the Last Year: Never true  Transportation Needs: No Transportation Needs (03/01/2022)   PRAPARE - Hydrologist (Medical): No    Lack of Transportation (Non-Medical): No  Physical Activity: Not on file  Stress: Not on file  Social Connections: Not on file  Intimate Partner Violence: Not At Risk (03/01/2022)   Humiliation, Afraid, Rape, and Kick questionnaire    Fear of Current or Ex-Partner: No    Emotionally Abused: No    Physically Abused: No    Sexually Abused: No     Review of Systems   Gen: Denies any fever, chills, fatigue, weight loss, lack of appetite.  CV: Denies chest pain, heart palpitations, peripheral edema, syncope.  Resp: Denies shortness of breath at rest or with exertion. Denies wheezing or cough.  GI: see HPI GU : Denies urinary burning, urinary frequency, urinary hesitancy MS: Denies joint pain, muscle weakness, cramps, or limitation of movement.  Derm: Denies rash, itching, dry skin Psych: Denies depression, anxiety, memory loss, and confusion Heme: Denies bruising, bleeding, and enlarged lymph nodes.   Physical Exam   BP 118/67 (  BP Location: Left Arm, Patient Position: Sitting, Cuff Size: Large)   Pulse 75   Temp (!) 97.1 F (36.2 C) (Temporal)   Ht 5' 7.5" (1.715 m)   Wt 210 lb 3.2 oz (95.3 kg)   BMI 32.44 kg/m   General:   Alert and oriented. Pleasant and cooperative. Well-nourished and well-developed.  Head:  Normocephalic and atraumatic. Eyes:  Without icterus, sclera clear and conjunctiva pink.  Ears:  Normal auditory acuity. Mouth:  No deformity or lesions, oral mucosa pink.  Lungs:  Clear to auscultation bilaterally. No wheezes, rales, or rhonchi. No distress.  Heart:  S1, S2 present without murmurs appreciated.  Abdomen:  +BS, soft, non-tender and non-distended. No HSM noted. No guarding or rebound. No masses appreciated.  Rectal:  Deferred  Msk:  Symmetrical without  gross deformities. Normal posture. Extremities:  Without edema. Neurologic:  Alert and  oriented x4;  grossly normal neurologically. Skin:  Intact without significant lesions or rashes. Psych:  Alert and cooperative. Normal mood and affect.   Assessment   Melanie Cordova is a 62 y.o. female with a history of anxiety, asthma, Bell's palsy, CHF with cardiomyopathy s/p ICD placement, COPD, GERD, HLD, HTN, hypothyroidism, seizures, tobacco use presenting today for evaluation prior to scheduling surveillance colonoscopy.  History of adenomatous colon polyps: Last colonoscopy March 2021 with multiple polyps revealed to be sessile serrated adenomas and tubular adenomas.  Known chronic constipation.  Has some occasional bright red blood with bowel movements but primarily having bright red blood in the toilet related to her kidney stones.  She does admit to some occasional symptoms impaction which may be also contributing to her rectal bleeding.  She is currently due for surveillance which we will schedule now with an augmented prep.  Chronic constipation: History of chronic constipation with almost daily stools but usually with multiple small hard stool balls.  Does have occasional gas and bloating as well.  Currently not taking anything for her constipation.  States she has tried Linzess in the past but had side effects of hair loss.  Would not use this for her augmented prep.  Will use Trulance instead and only use for short course to ensure adequate prep for her colonoscopy.  I advised her to start a daily stool softener as well as MiraLAX once daily for improvement in bowel function.  I also advised her to increase fiber in her diet.  PLAN   Proceed with colonoscopy with propofol by Dr. Gala Romney in near future: the risks, benefits, and alternatives have been discussed with the patient in detail. The patient states understanding and desires to proceed. ASA 3 (has ICD in place - reports does not  work) Hold iron for 1 week Trulance once daily for 3 days prior to prep PEG prep Start daily stool softener Start mirlalax 17g once daily.  Increase fiber in diet. Follow up in 4 months.    Venetia Night, MSN, FNP-BC, AGACNP-BC [facility]

## 2022-05-30 NOTE — H&P (View-Only) (Signed)
GI Office Note    Referring Provider: Carrolyn Meiers* Primary Care Physician:  Carrolyn Meiers, MD  Primary Gastroenterologist: Cristopher Estimable.Rourk, MD, Previously Dr. Oneida Alar  Chief Complaint   Chief Complaint  Patient presents with   Need for a TCS    Patient here today says she is in need of a Tcs, her last one was done on 06/04/2019 and the one before that was done 12/17/2013.Patient has noticed dark and bright red blood in stools at times. She says she has to evacuate her stools digitally at times, as she says she occasionally has constipation. She is not on any anti constipation medications,but tries to eat right.     History of Present Illness   Melanie Cordova is a 62 y.o. female presenting today at the request of Fanta, Tesfaye Demissie* for evaluation prior to scheduling surveillance colonoscopy.  Colonoscopy in 2015 with 1 cecal adenoma and 3 hyperplastic polyps.  Small internal hemorrhoids.  Redundant left colon.  Last office visit 02/20/2019.  That she would need propofol due to for polypharmacy.  Reported she will go several days without a bowel movement.  Taking Metamucil 2 tablespoons twice daily.  Positive flatus.  Denies any rectal bleeding.  Rare reflux, no dysphagia, lack of appetite, weight loss.  Taking Protonix once daily.  Advised to start Linzess 1 4 5  mcg once daily, 3 months prior to breakfast.  Samples provided.  Scheduled for colonoscopy.  Advised to hold iron for 7 days prior.  Colonoscopy March 2021: -6 sessile polyps in the descending, mid transverse, distal transverse, and ascending colon.  Polyps ranging 4-8 mm in size. -External and internal hemorrhoids -Mildly tortuous rectosigmoid and sigmoid colon -Ascending follow-up revealed to be sessile serrated polyp -Transverse and descending polyps revealed to be tubular adenomas without high-grade dysplasia -Advised follow-up in 3 years  Last lab work with Hgb 11.7 on 03/02/22. MCV  92.6.  Echo March 2023 with EF 40 to 45%.  Today: Constipation - Has to digitally disimpact herself. Sometimes has a lot that comes out and sometimes it does not. Has a BM about everyday or sometimes more frequent but usually very small amounts. Used to use corectal and has not used any otc regimens due to fear of interacting with her other medications. Has Been Unable to Take It As It Made HER Hair Fall out.  Reports her PCP took her off PPI at her last visit. Denies any reflux symptoms. Reports rare instances likely related to diet choices. No dysphagia, N/V.   Has had dark stools at times and bright red stools at times. Has been on iron for a long time. Mostly has bright red in the commode when she urinates. Has a good appetite. No early satiety. No diarrhea.   Reports she lost about 85lbs in the last year or so. Repots she has been eating the same. She does report a lot of stress however.    She states that her urologist told her that greens and kales and drinking tea caused her kidney stones. She feels as though it is the city water.   Reports she had kidney surgery and states she had kidney stones removed on the left but on the right she had lithotripsy and had a stent placed. Due to go back March 28 top have that removed. Still having kidney stones that comes out.   Repots ICD not working and too risky to remove. Denies chest pain or shortness of breath. No edema as  long as she stays away from salt. Cooks everything from scratch.    Current Outpatient Medications  Medication Sig Dispense Refill   acetaminophen (TYLENOL) 650 MG CR tablet Take 650 mg by mouth at bedtime.     albuterol (PROVENTIL HFA;VENTOLIN HFA) 108 (90 BASE) MCG/ACT inhaler Inhale 2 puffs into the lungs every 6 (six) hours as needed for wheezing or shortness of breath. 1 Inhaler 5   aspirin EC 81 MG tablet Take 1 tablet (81 mg total) by mouth daily. 21 tablet 0   Biotin 5000 MCG CAPS Take 15,000 mcg by mouth daily.      budesonide-formoterol (SYMBICORT) 80-4.5 MCG/ACT inhaler Inhale 2 puffs into the lungs 2 (two) times daily.     carvedilol (COREG) 12.5 MG tablet Take 25 mg by mouth 2 (two) times daily with a meal.     cetirizine (ZYRTEC) 10 MG tablet Take 10 mg by mouth daily.     clobetasol (TEMOVATE) 0.05 % external solution Apply 1 application  topically 2 (two) times daily as needed (psoriasis). Apply to scalp     clopidogrel (PLAVIX) 75 MG tablet Take 1 tablet (75 mg total) by mouth daily. 21 tablet 0   cyclobenzaprine (FLEXERIL) 10 MG tablet Take 10 mg by mouth at bedtime as needed for muscle spasms.     ENTRESTO 24-26 MG TAKE ONE TABLET BY MOUTH 2 TIMES A DAY 60 tablet 0   famotidine (PEPCID) 20 MG tablet Take 20 mg by mouth daily as needed for heartburn or indigestion.     ferrous sulfate 325 (65 FE) MG EC tablet Take 325 mg by mouth daily.     fluticasone (FLONASE) 50 MCG/ACT nasal spray Place 2 sprays into both nostrils daily.     furosemide (LASIX) 40 MG tablet Take 1 tablet (40 mg total) by mouth 2 (two) times daily. 180 tablet 0   ketoconazole (NIZORAL) 2 % cream Apply 1 application  topically 2 (two) times daily as needed (psoriasis).     levothyroxine (SYNTHROID) 175 MCG tablet Take 175 mcg by mouth every morning.     Multiple Vitamins-Minerals (CENTRAVITES 50 PLUS PO) Take 1 tablet by mouth daily.      ondansetron (ZOFRAN) 4 MG tablet Take 1 tablet (4 mg total) by mouth daily as needed for nausea or vomiting. 30 tablet 1   oxyCODONE-acetaminophen (PERCOCET/ROXICET) 5-325 MG tablet TAKE ONE TABLET BY MOUTH EVERY 4 HOURS AS NEEDED 30 tablet 0   potassium chloride SA (K-DUR) 20 MEQ tablet Take 20 mEq by mouth daily.      rosuvastatin (CRESTOR) 20 MG tablet Take 20 mg by mouth daily.     topiramate (TOPAMAX) 100 MG tablet TAKE ONE TABLET BY MOUTH AT BEDTIME 90 tablet 2   Ubrogepant (UBRELVY) 50 MG TABS Take 50 mg by mouth as needed. Can repeat a dose in 2 hours if headache persists 16 tablet 2    Vitamin D, Ergocalciferol, (DRISDOL) 1.25 MG (50000 UNIT) CAPS capsule Take 50,000 Units by mouth every 30 (thirty) days.     No current facility-administered medications for this visit.    Past Medical History:  Diagnosis Date   Anxiety    Arthritis    Asthma    Automatic implantable cardioverter-defibrillator in situ    2013, july   Bell palsy    states has had 3 episodes   Cardiomyopathy 08/2010   Presented with congestive heart failure; EF of 15% and 2012; hypotension on medication precludes optimal dosing   Chest pain  12/28/2017   CHF (congestive heart failure) ([place])    a. EF 20% in 2012 with low-risk NST --> St. Jude ICD implantation by Dr. Lovena Le in 09/2011 b. NST in 12/2017 showing evidence of prior infarct without ischemia c. EF at 45-50% by echo in [id number]   Chronic systolic heart failure ([place])    COPD (chronic obstructive pulmonary disease) ([place])    2013   Dysrhythmia    Gastroesophageal reflux disease    Headache(784.0)    Hot flashes 10/04/2016   Hyperlipidemia    Hypertension    09/2010-normal CMet and CBC; Lipid profile-116, 88, 25, 73   Hypothyroidism    Recent TSH was normal.   ICD (implantable cardiac defibrillator) in place 10/10/2011   Neuromuscular disorder (HCC)    Neuropathy, right foot and leg   Neuropathy    Obesity    Pneumonia    Seizures ([place])    last one at age 58   Shortness of breath    Stroke ([place])     stroke in 07/2012 and another in June, 2014   Tobacco abuse    20 pack years    Past Surgical History:  Procedure Laterality Date   CESAREAN SECTION     X2   COLONOSCOPY N/A 12/16/2013   One simple adenoma and 3 hyperplastic polyps. Small internal hemorrhoids. Left colon redundant. Due for surveillance 2020.    COLONOSCOPY WITH PROPOFOL N/A 06/04/2019   Procedure: COLONOSCOPY WITH PROPOFOL;  Surgeon: Danie Binder, MD;  [facility]: [facility];  Service: Endoscopy;  Laterality: N/A;  10:30am   CYSTOSCOPY W/ URETERAL STENT PLACEMENT  Bilateral 03/01/2022   Procedure: CYSTOSCOPY WITH RETROGRADE PYELOGRAM/URETERAL STENT PLACEMENT;  Surgeon: Cleon Gustin, MD;  Location: [facility];  Service: Urology;  Laterality: Bilateral;   CYSTOSCOPY WITH RETROGRADE PYELOGRAM, URETEROSCOPY AND STENT PLACEMENT Left 05/05/2022   Procedure: CYSTOSCOPY WITH RETROGRADE PYELOGRAM, URETEROSCOPY AND STENT EXCHANGE;  Surgeon: Cleon Gustin, MD;  Location: [facility];  Service: Urology;  Laterality: Left;   EP IMPLANTABLE DEVICE     St. Jude   EP IMPLANTABLE DEVICE N/A 08/13/2015   Procedure: Loop Recorder Insertion;  Surgeon: Evans Lance, MD;  Location: [facility];  Service: Cardiovascular;  Laterality: N/A;   HOLMIUM LASER APPLICATION Left [id number]   Procedure: HOLMIUM LASER APPLICATION;  Surgeon: Cleon Gustin, MD;  Location: [facility];  Service: Urology;  Laterality: Left;   IMPLANTABLE CARDIOVERTER DEFIBRILLATOR IMPLANT N/A 10/10/2011   Procedure: IMPLANTABLE CARDIOVERTER DEFIBRILLATOR IMPLANT;  Surgeon: Evans Lance, MD;  Location: [facility];  Service: Cardiovascular;  Laterality: N/A;   MULTIPLE EXTRACTIONS WITH ALVEOLOPLASTY N/A 09/24/2012   Procedure: MULTIPLE EXTRACION #2, 4, 6, 7 ,8, 9, 11, 13, 18, 20, 21, 22, 23, 24, 25, 26, 27, 29 WITH ALVEOLOPLASTY, BIOPSY OF PALATE LESION, REMOVA RIGHT LINGUAL TORUS;  Surgeon: Gae Bon, DDS;  Location: [facility];  Service: Oral Surgery;  Laterality: N/A;   POLYPECTOMY  06/04/2019   Procedure: POLYPECTOMY;  Surgeon: Danie Binder, MD;  [facility];  Service: Endoscopy;;   TEE WITHOUT CARDIOVERSION N/A 07/11/2012   Procedure: TRANSESOPHAGEAL ECHOCARDIOGRAM (TEE);  Surgeon: Thayer Headings, MD;  Location[facility] END[facility]COPY;  Service: Cardiovascular;  Laterality: N/A;   TUBAL LIGATION      Family History  Problem Relation Age of Onset   Cardiomyopathy Mother        ICD pacemaker-ischmic CM   Heart failure Mother    Hypertension Mother  Diabetes Mother    Thyroid disease  Mother    Cardiomyopathy Father        Deceased   Coronary artery disease Father    Heart failure Father    Heart failure Brother    Hypertension Brother    Endometriosis Sister    Heart disease Paternal Grandfather    Heart disease Paternal Grandmother    Heart disease Maternal Grandmother    Heart disease Maternal Grandfather    Hypertension Daughter    Obesity Daughter    Colon cancer Neg Hx    Liver disease Neg Hx     Allergies as of 05/31/2022 - Review Complete 05/31/2022  Allergen Reaction Noted   Fish allergy Anaphylaxis 03/20/2012   Iodinated contrast media Hives and Other (See Comments) 09/24/2010   Iodine Hives and Other (See Comments) 12/23/2010   Lentil Anaphylaxis 03/20/2012   Penicillins Anaphylaxis and Shortness Of Breath 05/04/2010   Lipitor [atorvastatin] Itching and Rash 07/27/2012   Spironolactone Rash 07/11/2011   Sulfa antibiotics Rash 05/04/2010    Social History   Socioeconomic History   Marital status: Divorced    Spouse name: Not on file   Number of children: 2   Years of education: Not on file   Highest education level: Not on file  Occupational History    Employer: [employer]    Comment: Works in Environmental consultant  Tobacco Use   Smoking status: Every Day    Packs/day: 0.75    Years: 21.00    Additional pack years: 0.00    Total pack years: 15.75    Types: Cigarettes    Start date: 07/02/1983   Smokeless tobacco: Never  Vaping Use   Vaping Use: Former  Substance and Sexual Activity   Alcohol use: No    Alcohol/week: 0.0 standard drinks of alcohol   Drug use: No   Sexual activity: Not Currently    Birth control/protection: Abstinence, Surgical    Comment: tubal  Other Topics Concern   Not on file  Social History Narrative   Not on file   Social Determinants of Health   Financial Resource Strain: Not on file  Food Insecurity: No Food Insecurity (03/01/2022)   Hunger Vital Sign    Worried About Running Out of Food in the  Last Year: Never true    Ran Out of Food in the Last Year: Never true  Transportation Needs: No Transportation Needs (03/01/2022)   PRAPARE - Hydrologist (Medical): No    Lack of Transportation (Non-Medical): No  Physical Activity: Not on file  Stress: Not on file  Social Connections: Not on file  Intimate Partner Violence: Not At Risk (03/01/2022)   Humiliation, Afraid, Rape, and Kick questionnaire    Fear of Current or Ex-Partner: No    Emotionally Abused: No    Physically Abused: No    Sexually Abused: No     Review of Systems   Gen: Denies any fever, chills, fatigue, weight loss, lack of appetite.  CV: Denies chest pain, heart palpitations, peripheral edema, syncope.  Resp: Denies shortness of breath at rest or with exertion. Denies wheezing or cough.  GI: see HPI GU : Denies urinary burning, urinary frequency, urinary hesitancy MS: Denies joint pain, muscle weakness, cramps, or limitation of movement.  Derm: Denies rash, itching, dry skin Psych: Denies depression, anxiety, memory loss, and confusion Heme: Denies bruising, bleeding, and enlarged lymph nodes.   Physical Exam   BP 118/67 (  BP Location: Left Arm, Patient Position: Sitting, Cuff Size: Large)   Pulse 75   Temp (!) 97.1 F (36.2 C) (Temporal)   Ht 5' 7.5" (1.715 m)   Wt 210 lb 3.2 oz (95.3 kg)   BMI 32.44 kg/m   General:   Alert and oriented. Pleasant and cooperative. Well-nourished and well-developed.  Head:  Normocephalic and atraumatic. Eyes:  Without icterus, sclera clear and conjunctiva pink.  Ears:  Normal auditory acuity. Mouth:  No deformity or lesions, oral mucosa pink.  Lungs:  Clear to auscultation bilaterally. No wheezes, rales, or rhonchi. No distress.  Heart:  S1, S2 present without murmurs appreciated.  Abdomen:  +BS, soft, non-tender and non-distended. No HSM noted. No guarding or rebound. No masses appreciated.  Rectal:  Deferred  Msk:  Symmetrical without  gross deformities. Normal posture. Extremities:  Without edema. Neurologic:  Alert and  oriented x4;  grossly normal neurologically. Skin:  Intact without significant lesions or rashes. Psych:  Alert and cooperative. Normal mood and affect.   Assessment   Melanie Cordova is a 62 y.o. female with a history of anxiety, asthma, Bell's palsy, CHF with cardiomyopathy s/p ICD placement, COPD, GERD, HLD, HTN, hypothyroidism, seizures, tobacco use presenting today for evaluation prior to scheduling surveillance colonoscopy.  History of adenomatous colon polyps: Last colonoscopy March 2021 with multiple polyps revealed to be sessile serrated adenomas and tubular adenomas.  Known chronic constipation.  Has some occasional bright red blood with bowel movements but primarily having bright red blood in the toilet related to her kidney stones.  She does admit to some occasional symptoms impaction which may be also contributing to her rectal bleeding.  She is currently due for surveillance which we will schedule now with an augmented prep.  Chronic constipation: History of chronic constipation with almost daily stools but usually with multiple small hard stool balls.  Does have occasional gas and bloating as well.  Currently not taking anything for her constipation.  States she has tried Linzess in the past but had side effects of hair loss.  Would not use this for her augmented prep.  Will use Trulance instead and only use for short course to ensure adequate prep for her colonoscopy.  I advised her to start a daily stool softener as well as MiraLAX once daily for improvement in bowel function.  I also advised her to increase fiber in her diet.  PLAN   Proceed with colonoscopy with propofol by Dr. Gala Romney in near future: the risks, benefits, and alternatives have been discussed with the patient in detail. The patient states understanding and desires to proceed. ASA 3 (has ICD in place - reports does not  work) Hold iron for 1 week Trulance once daily for 3 days prior to prep PEG prep Start daily stool softener Start mirlalax 17g once daily.  Increase fiber in diet. Follow up in 4 months.    Venetia Night, MSN, FNP-BC, AGACNP-BC [facility]

## 2022-05-31 ENCOUNTER — Ambulatory Visit (INDEPENDENT_AMBULATORY_CARE_PROVIDER_SITE_OTHER): Payer: 59 | Admitting: Gastroenterology

## 2022-05-31 ENCOUNTER — Encounter: Payer: Self-pay | Admitting: Gastroenterology

## 2022-05-31 VITALS — BP 118/67 | HR 75 | Temp 97.1°F | Ht 67.5 in | Wt 210.2 lb

## 2022-05-31 DIAGNOSIS — Z1211 Encounter for screening for malignant neoplasm of colon: Secondary | ICD-10-CM

## 2022-05-31 DIAGNOSIS — K5904 Chronic idiopathic constipation: Secondary | ICD-10-CM | POA: Diagnosis not present

## 2022-05-31 DIAGNOSIS — Z86010 Personal history of colonic polyps: Secondary | ICD-10-CM | POA: Diagnosis not present

## 2022-05-31 NOTE — Patient Instructions (Addendum)
We are scheduling for colonoscopy in the near future Dr. Gala Romney.  We will give you a separate detailed written instructions regarding your prep however you will need to hold your iron for 1 week prior.  I will provide you with some samples of Trulance for you to take daily for 3 days prior to your prep.    Please begin taking a daily stool softener such as Colace (100 mg) once daily.  You may use generic.  Also due to start taking MiraLAX 17 g (1 capful) daily.  You may also use generic of this.  This is to ensure that you have better bowel movements without the need for disimpaction.  We need to ensure your stools are softer and easier for you to go.  Please try to refrain from disimpacting herself.  I would recommend you increase fiber in your diet.  It was a pleasure to see you today. I want to create trusting relationships with patients. If you receive a survey regarding your visit,  I greatly appreciate you taking time to fill this out on paper or through your MyChart. I value your feedback.  Venetia Night, MSN, FNP-BC, AGACNP-BC [facility]

## 2022-06-01 ENCOUNTER — Telehealth (INDEPENDENT_AMBULATORY_CARE_PROVIDER_SITE_OTHER): Payer: Self-pay | Admitting: *Deleted

## 2022-06-01 ENCOUNTER — Encounter (INDEPENDENT_AMBULATORY_CARE_PROVIDER_SITE_OTHER): Payer: Self-pay | Admitting: *Deleted

## 2022-06-01 ENCOUNTER — Other Ambulatory Visit (INDEPENDENT_AMBULATORY_CARE_PROVIDER_SITE_OTHER): Payer: Self-pay | Admitting: *Deleted

## 2022-06-01 MED ORDER — CLENPIQ 10-3.5-12 MG-GM -GM/175ML PO SOLN: 1.0000 | ORAL | 0 refills | Status: DC

## 2022-06-01 NOTE — Telephone Encounter (Signed)
Per [facility] "Notification or Prior Authorization is not required for the requested services You are not required to submit a notification/prior authorization based on the information provided. The number above acknowledges your inquiry and our response. Please write this number down and refer to it for future inquiries. If you still wish to submit your request for review, please select the Continue with Submission button below. Decision ID #: [id number]"

## 2022-06-02 ENCOUNTER — Encounter: Payer: Self-pay | Admitting: Internal Medicine

## 2022-06-02 NOTE — Progress Notes (Signed)
[place] DEVICE PROGRAMMING  Patient Information: Name:  Melanie Cordova  DOB:  1960-04-24  MRN:  [id number]   CYSTOSCOPY with Right Ureteral STENT EXCHANGE    Alyson Ingles Candee Furbish, MD    06/09/22  Device Information:   Clinic EP Physician:  [name], MD    Device Type:  Defibrillator Manufacturer and Phone #:  St. Jude/A[employer]ott: [phone] Pacemaker Dependent?:  No. Date of Last Device Check:  04/12/22           Normal Device Function?:  Pt's device is end of service with no plans to replace.   End of service 07/07/2021.    Electrophysiologist's Recommendations:   Have magnet available. Provide continuous ECG monitoring when magnet is used or reprogramming is to be performed.  Procedure should not interfere with device function.  No device programming or magnet placement needed.   Per Device Clinic Standing Orders, Damian Leavell, RN  12:07 PM 06/02/2022

## 2022-06-02 NOTE — Patient Instructions (Signed)
Melanie Cordova  06/02/2022     @PREFPERIOPPHARMACY @   Your procedure is scheduled on  06/09/2022.   Report to [facility] at  1000  A.M.   Call this number if you have problems the morning of surgery:  [phone]  If you experience any cold or flu symptoms such as cough, fever, chills, shortness of breath, etc. between now and your scheduled surgery, please notify us at the above number.   Remember:  Do not eat or drink after midnight.       Your last dose of plavix should have been on 06/03/2022.     Take these medicines the morning of surgery with A SIP OF WATER       carvedilol, zyrtec, entresto, pepcid, levothyroxine, zofran (if needed), oxycodone (if needed), ubrelvy(if needed).     Do not wear jewelry, make-up or nail polish.  Do not wear lotions, powders, or perfumes, or deodorant.  Do not shave 48 hours prior to surgery.  Men may shave face and neck.  Do not bring valuables to the hospital.  [facility] is not responsible for any belongings or valuables.  Contacts, dentures or bridgework may not be worn into surgery.  Leave your suitcase in the car.  After surgery it may be brought to your room.  For patients admitted to the hospital, discharge time will be determined by your treatment team.  Patients discharged the day of surgery will not be allowed to drive home and must have someone with them for 24 hours.    Special instructions:   DO NOT smoke tobacco or vape for 24 hours before your procedure.  Please read over the following fact sheets that you were given. Pain Booklet, Surgical Site Infection Prevention, Anesthesia Post-op Instructions, and Care and Recovery After Surgery      Ureteral Stent Implantation, Care After The following information offers guidance on how to care for yourself after your procedure. Your health care provider may also give you more specific instructions. If you have problems or questions, contact your health care  provider. What can I expect after the procedure? After the procedure, it is common to have: Nausea. Mild pain when you urinate. You may feel this pain in your lower back or lower abdomen. The pain should stop within a few minutes after you urinate. This pattern may last for up to 1 week. A small amount of blood in your urine for several days. Follow these instructions at home: Medicines Take over-the-counter and prescription medicines only as told by your health care provider. If you were prescribed antibiotics, take them as told by your health care provider. Do not stop using the antibiotic even if you start to feel better. If you were given a sedative during the procedure, it can affect you for several hours. Do not drive or operate machinery until your health care provider says that it is safe. Ask your health care provider if the medicine prescribed to you: Requires you to avoid driving or using machinery. Can cause constipation. You may need to take these actions to prevent or treat constipation: Take over-the-counter or prescription medicines. Eat foods that are high in fiber, such as beans, whole grains, and fresh fruits and vegetables. Limit foods that are high in fat and processed sugars, such as fried or sweet foods. Activity Rest as told by your health care provider. Do not sit for a long time without moving. Get up to take short walks every  1-2 hours. This will improve blood flow and breathing. Ask for help if you feel weak or unsteady. Return to your normal activities as told by your health care provider. Ask your health care provider what activities are safe for you. General instructions  If you have a catheter: Follow instructions from your health care provider about taking care of your catheter and collection bag. Do not Drink enough fluid to keep your urine pale yellow. Do not use any products that contain nicotine or tobacco. These products include cigarettes, chewing  tobacco, and vaping devices, such as e-cigarettes. These can delay healing after surgery. If you need help quitting, ask your health care provider. Keep all follow-up visits. Contact a health care provider if: You start passing blood clots, or you have more than a small amount of blood in your urine. You have pain that gets worse or does not get better with medicine, especially pain when you urinate. You have trouble urinating. You feel nauseous or you vomit again and again during a period of more than 2 days after the procedure. You have a fever. Get help right away if: You are passing blood clots that are 1 inch (2.5 cm) or larger in size. You are leaking urine (have incontinence), or you cannot urinate. The end of the stent comes out of your urethra. You have sudden, sharp, or severe pain in your abdomen or lower back. You have swelling or pain in your legs. You have trouble breathing. These symptoms may be an emergency. Get help right away. Call 911. Do not wait to see if the symptoms will go away. Do not drive yourself to the hospital. Summary After the procedure, it is common to have mild pain when you urinate that goes away within a few minutes after you urinate. This may last for up to 1 week. Take over-the-counter and prescription medicines only as told by your health care provider. Drink enough fluid to keep your urine pale yellow. Call your health care provider if you start passing blood clots, or you have more than a small amount of blood in your urine. This information is not intended to replace advice given to you by your health care provider. Make sure you discuss any questions you have with your health care provider. Document Revised: 04/05/2021 Document Reviewed: 04/05/2021 Elsevier Patient Education  [place] Anesthesia, Adult, Care After The following information offers guidance on how to care for yourself after your procedure. Your health care  provider may also give you more specific instructions. If you have problems or questions, contact your health care provider. What can I expect after the procedure? After the procedure, it is common for people to: Have pain or discomfort at the IV site. Have nausea or vomiting. Have a sore throat or hoarseness. Have trouble concentrating. Feel cold or chills. Feel weak, sleepy, or tired (fatigue). Have soreness and body aches. These can affect parts of the body that were not involved in surgery. Follow these instructions at home: For the time period you were told by your health care provider:  Rest. Do not participate in activities where you could fall or become injured. Do not drive or use machinery. Do not drink alcohol. Do not take sleeping pills or medicines that cause drowsiness. Do not make important decisions or sign legal documents. Do not take care of children on your own. General instructions Drink enough fluid to keep your urine pale yellow. If you have sleep apnea, surgery and certain medicines  can increase your risk for breathing problems. Follow instructions from your health care provider about wearing your sleep device: Anytime you are sleeping, including during daytime naps. While taking prescription pain medicines, sleeping medicines, or medicines that make you drowsy. Return to your normal activities as told by your health care provider. Ask your health care provider what activities are safe for you. Take over-the-counter and prescription medicines only as told by your health care provider. Do not use any products that contain nicotine or tobacco. These products include cigarettes, chewing tobacco, and vaping devices, such as e-cigarettes. These can delay incision healing after surgery. If you need help quitting, ask your health care provider. Contact a health care provider if: You have nausea or vomiting that does not get better with medicine. You vomit every time you  eat or drink. You have pain that does not get better with medicine. You cannot urinate or have bloody urine. You develop a skin rash. You have a fever. Get help right away if: You have trouble breathing. You have chest pain. You vomit blood. These symptoms may be an emergency. Get help right away. Call 911. Do not wait to see if the symptoms will go away. Do not drive yourself to the hospital. Summary After the procedure, it is common to have a sore throat, hoarseness, nausea, vomiting, or to feel weak, sleepy, or fatigue. For the time period you were told by your health care provider, do not drive or use machinery. Get help right away if you have difficulty breathing, have chest pain, or vomit blood. These symptoms may be an emergency. This information is not intended to replace advice given to you by your health care provider. Make sure you discuss any questions you have with your health care provider. Document Revised: 05/28/2021 Document Reviewed: 05/28/2021 Elsevier Patient Education  [place].

## 2022-06-06 ENCOUNTER — Encounter (HOSPITAL_COMMUNITY)
Admission: RE | Admit: 2022-06-06 | Discharge: 2022-06-06 | Disposition: A | Discharge status: Home / Self Care | Payer: 59 | Source: Ambulatory Visit | Attending: Urology | Admitting: Urology

## 2022-06-06 ENCOUNTER — Encounter (HOSPITAL_COMMUNITY): Payer: Self-pay

## 2022-06-06 VITALS — BP 116/59 | HR 72 | Resp 20 | Ht 67.0 in | Wt 210.0 lb

## 2022-06-06 DIAGNOSIS — Z01812 Encounter for preprocedural laboratory examination: Secondary | ICD-10-CM | POA: Insufficient documentation

## 2022-06-06 DIAGNOSIS — Z862 Personal history of diseases of the blood and blood-forming organs and certain disorders involving the immune mechanism: Secondary | ICD-10-CM

## 2022-06-06 DIAGNOSIS — Z79899 Other long term (current) drug therapy: Secondary | ICD-10-CM | POA: Diagnosis not present

## 2022-06-06 DIAGNOSIS — E876 Hypokalemia: Secondary | ICD-10-CM

## 2022-06-06 LAB — CBC WITH DIFFERENTIAL/PLATELET
Abs Immature Granulocytes: 0.03 10*3/uL (ref 0.00–0.07)
Basophils Absolute: 0.1 10*3/uL (ref 0.0–0.1)
Basophils Relative: 1 %
Eosinophils Absolute: 0.4 10*3/uL (ref 0.0–0.5)
Eosinophils Relative: 4 %
HCT: 40.4 % (ref 36.0–46.0)
Hemoglobin: 12.6 g/dL (ref 12.0–15.0)
Immature Granulocytes: 0 %
Lymphocytes Relative: 24 %
Lymphs Abs: 2.5 10*3/uL (ref 0.7–4.0)
MCH: 29.5 pg (ref 26.0–34.0)
MCHC: 31.2 g/dL (ref 30.0–36.0)
MCV: 94.6 fL (ref 80.0–100.0)
Monocytes Absolute: 0.6 10*3/uL (ref 0.1–1.0)
Monocytes Relative: 6 %
Neutro Abs: 7 10*3/uL (ref 1.7–7.7)
Neutrophils Relative %: 65 %
Platelets: 242 10*3/uL (ref 150–400)
RBC: 4.27 MIL/uL (ref 3.87–5.11)
RDW: 13 % (ref 11.5–15.5)
WBC: 10.7 10*3/uL — ABNORMAL HIGH (ref 4.0–10.5)
nRBC: 0 % (ref 0.0–0.2)

## 2022-06-06 LAB — BASIC METABOLIC PANEL
Anion gap: 10 (ref 5–15)
BUN: 17 mg/dL (ref 6–20)
CO2: 25 mmol/L (ref 22–32)
Calcium: 9.1 mg/dL (ref 8.9–10.3)
Chloride: 106 mmol/L (ref 98–111)
Creatinine, Ser: 0.84 mg/dL (ref 0.44–1.00)
GFR, Estimated: >60 mL/min (ref >60)
Glucose, Bld: 110 mg/dL — ABNORMAL HIGH (ref 70–99)
Potassium: 3.5 mmol/L (ref 3.5–5.1)
Sodium: 141 mmol/L (ref 135–145)

## 2022-06-08 MED ORDER — GENTAMICIN SULFATE 40 MG/ML IJ SOLN
5.0000 mg/kg | INTRAVENOUS | Status: AC
  Administered 2022-06-09: 380 mg via INTRAVENOUS
  Filled 2022-06-08: qty 9.5

## 2022-06-09 ENCOUNTER — Ambulatory Visit (HOSPITAL_BASED_OUTPATIENT_CLINIC_OR_DEPARTMENT_OTHER): Payer: 59 | Admitting: Certified Registered"

## 2022-06-09 ENCOUNTER — Encounter (HOSPITAL_COMMUNITY): Payer: Self-pay | Admitting: Urology

## 2022-06-09 ENCOUNTER — Ambulatory Visit (HOSPITAL_COMMUNITY)
Admission: RE | Admit: 2022-06-09 | Discharge: 2022-06-09 | Disposition: A | Discharge status: Home / Self Care | Payer: 59 | Attending: Urology | Admitting: Urology

## 2022-06-09 ENCOUNTER — Ambulatory Visit (HOSPITAL_COMMUNITY): Payer: 59 | Admitting: Certified Registered"

## 2022-06-09 ENCOUNTER — Encounter (HOSPITAL_COMMUNITY)
Admission: RE | Disposition: A | Discharge status: Home / Self Care | Payer: Self-pay | Source: Home / Self Care | Attending: Urology

## 2022-06-09 ENCOUNTER — Ambulatory Visit (HOSPITAL_COMMUNITY): Payer: 59

## 2022-06-09 DIAGNOSIS — Z862 Personal history of diseases of the blood and blood-forming organs and certain disorders involving the immune mechanism: Secondary | ICD-10-CM

## 2022-06-09 DIAGNOSIS — K219 Gastro-esophageal reflux disease without esophagitis: Secondary | ICD-10-CM | POA: Diagnosis not present

## 2022-06-09 DIAGNOSIS — Z09 Encounter for follow-up examination after completed treatment for conditions other than malignant neoplasm: Secondary | ICD-10-CM | POA: Diagnosis not present

## 2022-06-09 DIAGNOSIS — J189 Pneumonia, unspecified organism: Secondary | ICD-10-CM

## 2022-06-09 DIAGNOSIS — G40909 Epilepsy, unspecified, not intractable, without status epilepticus: Secondary | ICD-10-CM | POA: Insufficient documentation

## 2022-06-09 DIAGNOSIS — N200 Calculus of kidney: Secondary | ICD-10-CM | POA: Diagnosis not present

## 2022-06-09 DIAGNOSIS — Z8673 Personal history of transient ischemic attack (TIA), and cerebral infarction without residual deficits: Secondary | ICD-10-CM | POA: Diagnosis not present

## 2022-06-09 DIAGNOSIS — I110 Hypertensive heart disease with heart failure: Secondary | ICD-10-CM | POA: Insufficient documentation

## 2022-06-09 DIAGNOSIS — Z79899 Other long term (current) drug therapy: Secondary | ICD-10-CM

## 2022-06-09 DIAGNOSIS — N201 Calculus of ureter: Secondary | ICD-10-CM

## 2022-06-09 DIAGNOSIS — F17210 Nicotine dependence, cigarettes, uncomplicated: Secondary | ICD-10-CM

## 2022-06-09 DIAGNOSIS — I509 Heart failure, unspecified: Secondary | ICD-10-CM | POA: Insufficient documentation

## 2022-06-09 DIAGNOSIS — J449 Chronic obstructive pulmonary disease, unspecified: Secondary | ICD-10-CM | POA: Diagnosis not present

## 2022-06-09 HISTORY — PX: CYSTOSCOPY WITH RETROGRADE PYELOGRAM, URETEROSCOPY AND STENT PLACEMENT: SHX5789

## 2022-06-09 HISTORY — PX: HOLMIUM LASER APPLICATION: SHX5852

## 2022-06-09 SURGERY — CYSTOURETEROSCOPY, WITH RETROGRADE PYELOGRAM AND STENT INSERTION
Anesthesia: General | Site: Ureter | Laterality: Right

## 2022-06-09 MED ORDER — MIDAZOLAM HCL 5 MG/5ML IJ SOLN
INTRAMUSCULAR | Status: DC | PRN
  Administered 2022-06-09: 2 mg via INTRAVENOUS

## 2022-06-09 MED ORDER — MIDAZOLAM HCL 2 MG/2ML IJ SOLN
INTRAMUSCULAR | Status: AC
  Filled 2022-06-09: qty 2

## 2022-06-09 MED ORDER — ONDANSETRON HCL 4 MG PO TABS: 4.0000 mg | ORAL_TABLET | Freq: Every day | ORAL | 1 refills | Status: DC | PRN

## 2022-06-09 MED ORDER — METOPROLOL TARTRATE 5 MG/5ML IV SOLN
INTRAVENOUS | Status: AC
  Filled 2022-06-09: qty 5

## 2022-06-09 MED ORDER — ONDANSETRON HCL 4 MG/2ML IJ SOLN
INTRAMUSCULAR | Status: AC
  Filled 2022-06-09: qty 2

## 2022-06-09 MED ORDER — DIATRIZOATE MEGLUMINE 30 % UR SOLN
URETHRAL | Status: DC | PRN
  Administered 2022-06-09: 8 mL via URETHRAL

## 2022-06-09 MED ORDER — PROPOFOL 10 MG/ML IV BOLUS
INTRAVENOUS | Status: DC | PRN
  Administered 2022-06-09: 120 mg via INTRAVENOUS

## 2022-06-09 MED ORDER — SUGAMMADEX SODIUM 200 MG/2ML IV SOLN
INTRAVENOUS | Status: DC | PRN
  Administered 2022-06-09: 200 mg via INTRAVENOUS

## 2022-06-09 MED ORDER — HYDROMORPHONE HCL 1 MG/ML IJ SOLN: 0.2500 mg | INTRAMUSCULAR | Status: DC | PRN

## 2022-06-09 MED ORDER — ROCURONIUM BROMIDE 100 MG/10ML IV SOLN
INTRAVENOUS | Status: DC | PRN
  Administered 2022-06-09: 50 mg via INTRAVENOUS

## 2022-06-09 MED ORDER — DIATRIZOATE MEGLUMINE 30 % UR SOLN
URETHRAL | Status: AC
  Filled 2022-06-09: qty 100

## 2022-06-09 MED ORDER — PROPOFOL 10 MG/ML IV BOLUS
INTRAVENOUS | Status: AC
  Filled 2022-06-09: qty 20

## 2022-06-09 MED ORDER — SODIUM CHLORIDE 0.9 % IR SOLN
Status: DC | PRN
  Administered 2022-06-09 (×2): 3000 mL

## 2022-06-09 MED ORDER — ONDANSETRON HCL 4 MG/2ML IJ SOLN
INTRAMUSCULAR | Status: DC | PRN
  Administered 2022-06-09: 4 mg via INTRAVENOUS

## 2022-06-09 MED ORDER — ORAL CARE MOUTH RINSE: 15.0000 mL | Freq: Once | OROMUCOSAL | Status: AC

## 2022-06-09 MED ORDER — MEPERIDINE HCL 50 MG/ML IJ SOLN: 6.2500 mg | INTRAMUSCULAR | Status: DC | PRN

## 2022-06-09 MED ORDER — DEXAMETHASONE SODIUM PHOSPHATE 10 MG/ML IJ SOLN
INTRAMUSCULAR | Status: DC | PRN
  Administered 2022-06-09: 5 mg via INTRAVENOUS

## 2022-06-09 MED ORDER — FENTANYL CITRATE (PF) 100 MCG/2ML IJ SOLN
INTRAMUSCULAR | Status: DC | PRN
  Administered 2022-06-09: 25 ug via INTRAVENOUS
  Administered 2022-06-09: 50 ug via INTRAVENOUS

## 2022-06-09 MED ORDER — LIDOCAINE HCL (PF) 2 % IJ SOLN
INTRAMUSCULAR | Status: AC
  Filled 2022-06-09: qty 5

## 2022-06-09 MED ORDER — ALFUZOSIN HCL ER 10 MG PO TB24: 10.0000 mg | ORAL_TABLET | Freq: Every day | ORAL | 1 refills | Status: DC

## 2022-06-09 MED ORDER — SENNA 8.6 MG PO TABS: 1.0000 | ORAL_TABLET | Freq: Every day | ORAL | 0 refills | Status: DC

## 2022-06-09 MED ORDER — ONDANSETRON HCL 4 MG/2ML IJ SOLN: 4.0000 mg | Freq: Once | INTRAMUSCULAR | Status: DC | PRN

## 2022-06-09 MED ORDER — WATER FOR IRRIGATION, STERILE IR SOLN
Status: DC | PRN
  Administered 2022-06-09: 500 mL

## 2022-06-09 MED ORDER — FENTANYL CITRATE (PF) 250 MCG/5ML IJ SOLN
INTRAMUSCULAR | Status: AC
  Filled 2022-06-09: qty 5

## 2022-06-09 MED ORDER — DEXAMETHASONE SODIUM PHOSPHATE 10 MG/ML IJ SOLN
INTRAMUSCULAR | Status: AC
  Filled 2022-06-09: qty 1

## 2022-06-09 MED ORDER — LIDOCAINE 2% (20 MG/ML) 5 ML SYRINGE
INTRAMUSCULAR | Status: DC | PRN
  Administered 2022-06-09: 80 mg via INTRAVENOUS

## 2022-06-09 MED ORDER — ROCURONIUM BROMIDE 10 MG/ML (PF) SYRINGE
PREFILLED_SYRINGE | INTRAVENOUS | Status: AC
  Filled 2022-06-09: qty 10

## 2022-06-09 MED ORDER — LACTATED RINGERS IV SOLN: INTRAVENOUS | Status: DC

## 2022-06-09 MED ORDER — CHLORHEXIDINE GLUCONATE 0.12 % MT SOLN
15.0000 mL | Freq: Once | OROMUCOSAL | Status: AC
  Administered 2022-06-09: 15 mL via OROMUCOSAL

## 2022-06-09 MED ORDER — OXYCODONE-ACETAMINOPHEN 5-325 MG PO TABS: 1.0000 | ORAL_TABLET | ORAL | 0 refills | Status: DC | PRN

## 2022-06-09 SURGICAL SUPPLY — 26 items
BAG DRAIN URO TABLE W/ADPT NS (BAG) ×1 IMPLANT
BAG DRN 8 ADPR NS SKTRN CSTL (BAG) ×1
BAG HAMPER (MISCELLANEOUS) ×1 IMPLANT
CATH INTERMIT  6FR 70CM (CATHETERS) ×1 IMPLANT
CLOTH BEACON ORANGE TIMEOUT ST (SAFETY) ×1 IMPLANT
DECANTER SPIKE VIAL GLASS SM (MISCELLANEOUS) ×1 IMPLANT
EXTRACTOR STONE NITINOL NGAGE (UROLOGICAL SUPPLIES) IMPLANT
GLOVE BIO SURGEON STRL SZ8 (GLOVE) ×1 IMPLANT
GLOVE BIOGEL PI IND STRL 7.0 (GLOVE) ×2 IMPLANT
GOWN STRL REUS W/TWL LRG LVL3 (GOWN DISPOSABLE) ×1 IMPLANT
GOWN STRL REUS W/TWL XL LVL3 (GOWN DISPOSABLE) ×1 IMPLANT
GUIDEWIRE STR DUAL SENSOR (WIRE) ×1 IMPLANT
GUIDEWIRE STR ZIPWIRE 035X150 (MISCELLANEOUS) ×1 IMPLANT
IV NS IRRIG 3000ML ARTHROMATIC (IV SOLUTION) ×2 IMPLANT
KIT TURNOVER CYSTO (KITS) ×1 IMPLANT
MANIFOLD NEPTUNE II (INSTRUMENTS) ×1 IMPLANT
PACK CYSTO (CUSTOM PROCEDURE TRAY) ×1 IMPLANT
PAD ARMBOARD 7.5X6 YLW CONV (MISCELLANEOUS) ×1 IMPLANT
SHEATH URETERAL 12FRX35CM (MISCELLANEOUS) IMPLANT
STENT URET 6FRX26 CONTOUR (STENTS) IMPLANT
SYR 10ML LL (SYRINGE) ×1 IMPLANT
SYR CONTROL 10ML LL (SYRINGE) ×1 IMPLANT
TOWEL OR 17X26 4PK STRL BLUE (TOWEL DISPOSABLE) ×1 IMPLANT
TRACTIP FLEXIVA PULS ID 200XHI (Laser) IMPLANT
TRACTIP FLEXIVA PULSE ID 200 (Laser) ×1
WATER STERILE IRR 500ML POUR (IV SOLUTION) ×1 IMPLANT

## 2022-06-09 NOTE — Interval H&P Note (Signed)
History and Physical Interval Note:  06/09/2022 11:25 AM  Melanie Cordova  has presented today for surgery, with the diagnosis of right renal calculus.  The various methods of treatment have been discussed with the patient and family. After consideration of risks, benefits and other options for treatment, the patient has consented to  Procedure(s): CYSTOSCOPY WITH RETROGRADE PYELOGRAM, URETEROSCOPY AND STENT EXCHANGE (Right) HOLMIUM LASER APPLICATION (Right) as a surgical intervention.  The patient's history has been reviewed, patient examined, no change in status, stable for surgery.  I have reviewed the patient's chart and labs.  Questions were answered to the patient's satisfaction.     Nicolette Bang

## 2022-06-09 NOTE — Anesthesia Preprocedure Evaluation (Signed)
Anesthesia Evaluation  Patient identified by MRN, date of birth, ID band Patient awake    Reviewed: Allergy & Precautions, NPO status , Patient's Chart, lab work & pertinent test results, reviewed documented beta blocker date and time   History of Anesthesia Complications Negative for: history of anesthetic complications  Airway Mallampati: I  TM Distance: >3 FB Neck ROM: Full    Dental  (+) Upper Dentures, Lower Dentures   Pulmonary shortness of breath and with exertion, asthma , pneumonia, COPD,  COPD inhaler, Current SmokerPatient did not abstain from smoking.   Pulmonary exam normal breath sounds clear to auscultation       Cardiovascular Exercise Tolerance: Good hypertension, Pt. on home beta blockers and Pt. on medications +CHF  Normal cardiovascular exam+ dysrhythmias + Cardiac Defibrillator (Non functioning devise)  Rhythm:Regular Rate:Normal  1. Left ventricular ejection fraction, by estimation, is 40 to 45%. The  left ventricle has mildly decreased function. The left ventricle  demonstrates global hypokinesis. The left ventricular internal cavity size  was mildly to moderately dilated. There  is mild left ventricular hypertrophy. Left ventricular diastolic  parameters are consistent with Grade I diastolic dysfunction (impaired  relaxation).   2. Right ventricular systolic function is normal. The right ventricular  size is normal.   3. Left atrial size was mildly dilated.   4. The mitral valve is normal in structure. Mild mitral valve  regurgitation. No evidence of mitral stenosis.   5. The aortic valve is normal in structure. Aortic valve regurgitation is  not visualized. No aortic stenosis is present.   6. The inferior vena cava is normal in size with greater than 50%  respiratory variability, suggesting right atrial pressure of 3 mmHg.   Comparison(s): No significant change from prior study. Prior images   reviewed side by side.   EKG - SR with occasional PVCs, Non specific ST and T wave abnormalities   Neuro/Psych  Headaches, Seizures - (not on meds), Well Controlled,   Anxiety     TIA Neuromuscular disease CVA, No Residual Symptoms    GI/Hepatic ,GERD  Medicated and Poorly Controlled,,  Endo/Other  Hypothyroidism    Renal/GU Renal disease     Musculoskeletal  (+) Arthritis , Osteoarthritis,    Abdominal   Peds  Hematology   Anesthesia Other Findings   Reproductive/Obstetrics                             Anesthesia Physical Anesthesia Plan  ASA: 3  Anesthesia Plan: General   Post-op Pain Management: Dilaudid IV   Induction: Intravenous and Rapid sequence  PONV Risk Score and Plan: 4 or greater and Ondansetron and Dexamethasone  Airway Management Planned: Oral ETT  Additional Equipment:   Intra-op Plan:   Post-operative Plan: Extubation in OR  Informed Consent: I have reviewed the patients History and Physical, chart, labs and discussed the procedure including the risks, benefits and alternatives for the proposed anesthesia with the patient or authorized representative who has indicated his/her understanding and acceptance.     Dental advisory given  Plan Discussed with: CRNA and Surgeon  Anesthesia Plan Comments:        Anesthesia Quick Evaluation

## 2022-06-09 NOTE — Anesthesia Postprocedure Evaluation (Signed)
Anesthesia Post Note  Patient: Melanie Cordova  Procedure(s) Performed: CYSTOSCOPY WITH RETROGRADE PYELOGRAM, URETEROSCOPY AND STENT EXCHANGE (Right: Ureter) HOLMIUM LASER APPLICATION (Right: Ureter)  Patient location during evaluation: Phase II Anesthesia Type: General Level of consciousness: awake and alert and oriented Pain management: pain level controlled Vital Signs Assessment: post-procedure vital signs reviewed and stable Respiratory status: spontaneous breathing, nonlabored ventilation and respiratory function stable Cardiovascular status: blood pressure returned to baseline and stable Postop Assessment: no apparent nausea or vomiting Anesthetic complications: no  No notable events documented.   Last Vitals:  Vitals:   06/09/22 1320 06/09/22 1329  BP:  137/68  Pulse: 71 69  Resp: 13 14  Temp:  36.5 C  SpO2: 100% 100%    Last Pain:  Vitals:   06/09/22 1335  TempSrc:   PainSc: 2                  Rocco Kerkhoff C Chiffon Kittleson

## 2022-06-09 NOTE — Transfer of Care (Signed)
Immediate Anesthesia Transfer of Care Note  Patient: Melanie Cordova  Procedure(s) Performed: CYSTOSCOPY WITH RETROGRADE PYELOGRAM, URETEROSCOPY AND STENT EXCHANGE (Right: Ureter) HOLMIUM LASER APPLICATION (Right: Ureter)  Patient Location: PACU  Anesthesia Type:General  Level of Consciousness: awake, alert , and patient cooperative  Airway & Oxygen Therapy: Patient Spontanous Breathing and Patient connected to face mask oxygen  Post-op Assessment: Report given to RN, Post -op Vital signs reviewed and stable, and Patient moving all extremities  Post vital signs: Reviewed and stable  Last Vitals:  Vitals Value Taken Time  BP 120/57 06/09/22 1252  Temp 97.8 06/09/22 1252  Pulse 79 06/09/22 1257  Resp 15 06/09/22 1257  SpO2 100 % 06/09/22 1257  Vitals shown include unvalidated device data.  Last Pain:  Vitals:   06/09/22 1019  TempSrc: Oral  PainSc: 0-No pain      Patients Stated Pain Goal: 5 ([id number])  Complications: No notable events documented.

## 2022-06-09 NOTE — Op Note (Signed)
Preoperative diagnosis: Right renal stone  Postoperative diagnosis: Same  Procedure: 1 cystoscopy 2. Right retrograde pyelography 3.  Intraoperative fluoroscopy, under one hour, with interpretation 4.  Right ureteroscopic stone manipulation with laser lithotripsy 5.  right 6 x 26 JJ stent placement  Attending: Rosie Fate  Anesthesia: General  Estimated blood loss: None  Drains: Right 6 x 26 JJ ureteral stent without tether  Specimens: stone for analysis  Antibiotics: ancef  Findings: Right UPJ calculus. Severe hydronephrosis. No masses/lesions in the bladder. Ureteral orifices in normal anatomic location.  Indications: Patient is a 62 year old female with a history of right renal stone who underwent stent placement for sepsis.  After discussing treatment options, she decided proceed with right ureteroscopic stone manipulation.  Procedure in detail: The patient was brought to the operating room and a brief timeout was done to ensure correct patient, correct procedure, correct site.  General anesthesia was administered patient was placed in dorsal lithotomy position.  Her genitalia was then prepped and draped in usual sterile fashion.  A rigid 33 French cystoscope was passed in the urethra and the bladder.  Bladder was inspected free masses or lesions.  the ureteral orifices were in the normal orthotopic locations. Using a grasper the left ureter stent was removed.  a 6 french ureteral catheter was then instilled into the right ureteral orifice.  a gentle retrograde was obtained and findings noted above. Using a grasper the right ureteral stent was brought to the urethral meatus. we then placed a zip wire through the ureteral stent and advanced up to the renal pelvis.  We removed the stent. we then removed the cystoscope and cannulated the right ureteral orifice with a semirigid ureteroscope.  No stone was found in the ureter. Once we reached the UPJ a sensor wire was advanced in to  the renal pelvis. We then removed the ureteroscope and advanced am 12/14 x 35cm access sheath up to the renal pelvis. We then used the flexible ureteroscope to perform nephroscopy. We encountered the stone at the UPJ. Using a 242nm laser fiber the stone was dusted. Once the stone was dusted we then removed the access sheath under direct vision and noted no injury to the ureter. We then placed a 6 x 26 double-j ureteral stent over the original zip wire.  We then removed the wire and good coil was noted in the the renal pelvis under fluoroscopy and the bladder under direct vision. the bladder was then drained and this concluded the procedure which was well tolerated by patient.  Complications: None  Condition: Stable, extubated, transferred to PACU  Plan: Patient is to be discharged home as to follow-up in one week for stent removal

## 2022-06-09 NOTE — Anesthesia Procedure Notes (Signed)
Procedure Name: Intubation Date/Time: 06/09/2022 11:52 AM  Performed by: Gwyndolyn Saxon, CRNAPre-anesthesia Checklist: Patient identified, Emergency Drugs available, Suction available and Patient being monitored Patient Re-evaluated:Patient Re-evaluated prior to induction Oxygen Delivery Method: Circle System Utilized Preoxygenation: Pre-oxygenation with 100% oxygen Induction Type: IV induction Ventilation: Mask ventilation without difficulty and Oral airway inserted - appropriate to patient size Laryngoscope Size: Sabra Heck and 2 Grade View: Grade I Tube type: Oral Tube size: 7.0 mm Number of attempts: 1 Airway Equipment and Method: Stylet and Oral airway Placement Confirmation: ETT inserted through vocal cords under direct vision, positive ETCO2 and breath sounds checked- equal and bilateral Secured at: 21 cm Tube secured with: Tape Dental Injury: Teeth and Oropharynx as per pre-operative assessment  Comments: Modified RSI intubation with manual breaths provided with positive pressure under 20cmh2o and cricoid pressure applied.

## 2022-06-13 ENCOUNTER — Encounter (HOSPITAL_COMMUNITY): Payer: Self-pay

## 2022-06-13 ENCOUNTER — Encounter (HOSPITAL_COMMUNITY)
Admission: RE | Admit: 2022-06-13 | Discharge: 2022-06-13 | Disposition: A | Discharge status: Home / Self Care | Payer: 59 | Source: Ambulatory Visit | Attending: Internal Medicine | Admitting: Internal Medicine

## 2022-06-13 NOTE — Pre-Procedure Instructions (Signed)
Attempted pre-op phonecall. Left VM for her to call us back. 

## 2022-06-15 ENCOUNTER — Ambulatory Visit (HOSPITAL_COMMUNITY)
Admission: RE | Admit: 2022-06-15 | Discharge: 2022-06-15 | Disposition: A | Discharge status: Home / Self Care | Payer: 59 | Attending: Internal Medicine | Admitting: Internal Medicine

## 2022-06-15 ENCOUNTER — Encounter (HOSPITAL_COMMUNITY)
Admission: RE | Disposition: A | Discharge status: Home / Self Care | Payer: Self-pay | Source: Home / Self Care | Attending: Internal Medicine

## 2022-06-15 ENCOUNTER — Ambulatory Visit (HOSPITAL_BASED_OUTPATIENT_CLINIC_OR_DEPARTMENT_OTHER): Payer: 59 | Admitting: Certified Registered"

## 2022-06-15 ENCOUNTER — Ambulatory Visit (HOSPITAL_COMMUNITY): Payer: 59 | Admitting: Certified Registered"

## 2022-06-15 DIAGNOSIS — J449 Chronic obstructive pulmonary disease, unspecified: Secondary | ICD-10-CM

## 2022-06-15 DIAGNOSIS — Z87442 Personal history of urinary calculi: Secondary | ICD-10-CM | POA: Diagnosis not present

## 2022-06-15 DIAGNOSIS — I5022 Chronic systolic (congestive) heart failure: Secondary | ICD-10-CM | POA: Diagnosis not present

## 2022-06-15 DIAGNOSIS — I429 Cardiomyopathy, unspecified: Secondary | ICD-10-CM | POA: Diagnosis not present

## 2022-06-15 DIAGNOSIS — I110 Hypertensive heart disease with heart failure: Secondary | ICD-10-CM

## 2022-06-15 DIAGNOSIS — E039 Hypothyroidism, unspecified: Secondary | ICD-10-CM | POA: Diagnosis not present

## 2022-06-15 DIAGNOSIS — F17210 Nicotine dependence, cigarettes, uncomplicated: Secondary | ICD-10-CM

## 2022-06-15 DIAGNOSIS — Z1211 Encounter for screening for malignant neoplasm of colon: Secondary | ICD-10-CM | POA: Diagnosis present

## 2022-06-15 DIAGNOSIS — Z8249 Family history of ischemic heart disease and other diseases of the circulatory system: Secondary | ICD-10-CM | POA: Diagnosis not present

## 2022-06-15 DIAGNOSIS — D123 Benign neoplasm of transverse colon: Secondary | ICD-10-CM

## 2022-06-15 DIAGNOSIS — I509 Heart failure, unspecified: Secondary | ICD-10-CM

## 2022-06-15 DIAGNOSIS — Z86010 Personal history of colonic polyps: Secondary | ICD-10-CM | POA: Diagnosis not present

## 2022-06-15 DIAGNOSIS — K635 Polyp of colon: Secondary | ICD-10-CM | POA: Diagnosis not present

## 2022-06-15 DIAGNOSIS — Z95810 Presence of automatic (implantable) cardiac defibrillator: Secondary | ICD-10-CM | POA: Diagnosis not present

## 2022-06-15 DIAGNOSIS — K219 Gastro-esophageal reflux disease without esophagitis: Secondary | ICD-10-CM | POA: Insufficient documentation

## 2022-06-15 DIAGNOSIS — E785 Hyperlipidemia, unspecified: Secondary | ICD-10-CM | POA: Diagnosis not present

## 2022-06-15 DIAGNOSIS — Z79899 Other long term (current) drug therapy: Secondary | ICD-10-CM | POA: Insufficient documentation

## 2022-06-15 DIAGNOSIS — G510 Bell's palsy: Secondary | ICD-10-CM | POA: Diagnosis not present

## 2022-06-15 HISTORY — PX: POLYPECTOMY: SHX5525

## 2022-06-15 HISTORY — PX: COLONOSCOPY WITH PROPOFOL: SHX5780

## 2022-06-15 SURGERY — COLONOSCOPY WITH PROPOFOL
Anesthesia: General

## 2022-06-15 MED ORDER — KETAMINE HCL 50 MG/5ML IJ SOSY
PREFILLED_SYRINGE | INTRAMUSCULAR | Status: AC
  Filled 2022-06-15: qty 5

## 2022-06-15 MED ORDER — PROPOFOL 500 MG/50ML IV EMUL
INTRAVENOUS | Status: DC | PRN
  Administered 2022-06-15: 100 ug/kg/min via INTRAVENOUS

## 2022-06-15 MED ORDER — MIDAZOLAM HCL 5 MG/5ML IJ SOLN
INTRAMUSCULAR | Status: DC | PRN
  Administered 2022-06-15: 2 mg via INTRAVENOUS

## 2022-06-15 MED ORDER — MIDAZOLAM HCL 2 MG/2ML IJ SOLN
INTRAMUSCULAR | Status: AC
  Filled 2022-06-15: qty 2

## 2022-06-15 MED ORDER — EPHEDRINE 5 MG/ML INJ
INTRAVENOUS | Status: AC
  Filled 2022-06-15: qty 5

## 2022-06-15 MED ORDER — PHENYLEPHRINE HCL (PRESSORS) 10 MG/ML IV SOLN
INTRAVENOUS | Status: DC | PRN
  Administered 2022-06-15: 160 ug via INTRAVENOUS
  Administered 2022-06-15 (×3): 80 ug via INTRAVENOUS

## 2022-06-15 MED ORDER — ESMOLOL HCL 100 MG/10ML IV SOLN
INTRAVENOUS | Status: DC | PRN
  Administered 2022-06-15: 30 mg via INTRAVENOUS

## 2022-06-15 MED ORDER — LACTATED RINGERS IV SOLN: INTRAVENOUS | Status: DC | PRN

## 2022-06-15 MED ORDER — GLYCOPYRROLATE PF 0.2 MG/ML IJ SOSY
PREFILLED_SYRINGE | INTRAMUSCULAR | Status: DC | PRN
  Administered 2022-06-15: .2 mg via INTRAVENOUS

## 2022-06-15 MED ORDER — GLYCOPYRROLATE PF 0.2 MG/ML IJ SOSY
PREFILLED_SYRINGE | INTRAMUSCULAR | Status: AC
  Filled 2022-06-15: qty 1

## 2022-06-15 MED ORDER — KETAMINE HCL 10 MG/ML IJ SOLN
INTRAMUSCULAR | Status: DC | PRN
  Administered 2022-06-15 (×2): 10 mg via INTRAVENOUS
  Administered 2022-06-15: 5 mg via INTRAVENOUS
  Administered 2022-06-15: 25 mg via INTRAVENOUS

## 2022-06-15 MED ORDER — PROPOFOL 10 MG/ML IV BOLUS
INTRAVENOUS | Status: DC | PRN
  Administered 2022-06-15: 50 mg via INTRAVENOUS
  Administered 2022-06-15: 75 mg via INTRAVENOUS

## 2022-06-15 MED ORDER — EPHEDRINE SULFATE-NACL 50-0.9 MG/10ML-% IV SOSY
PREFILLED_SYRINGE | INTRAVENOUS | Status: DC | PRN
  Administered 2022-06-15: 5 mg via INTRAVENOUS
  Administered 2022-06-15 (×2): 10 mg via INTRAVENOUS

## 2022-06-15 NOTE — Discharge Instructions (Signed)
  Colonoscopy Discharge Instructions  Read the instructions outlined below and refer to this sheet in the next few weeks. These discharge instructions provide you with general information on caring for yourself after you leave the hospital. Your doctor may also give you specific instructions. While your treatment has been planned according to the most current medical practices available, unavoidable complications occasionally occur. If you have any problems or questions after discharge, call Dr. Gala Romney at [phone]. ACTIVITY You may resume your regular activity, but move at a slower pace for the next 24 hours.  Take frequent rest periods for the next 24 hours.  Walking will help get rid of the air and reduce the bloated feeling in your belly (abdomen).  No driving for 24 hours (because of the medicine (anesthesia) used during the test).   Do not sign any important legal documents or operate any machinery for 24 hours (because of the anesthesia used during the test).  NUTRITION Drink plenty of fluids.  You may resume your normal diet as instructed by your doctor.  Begin with a light meal and progress to your normal diet. Heavy or fried foods are harder to digest and may make you feel sick to your stomach (nauseated).  Avoid alcoholic beverages for 24 hours or as instructed.  MEDICATIONS You may resume your normal medications unless your doctor tells you otherwise.  WHAT YOU CAN EXPECT TODAY Some feelings of bloating in the abdomen.  Passage of more gas than usual.  Spotting of blood in your stool or on the toilet paper.  IF YOU HAD POLYPS REMOVED DURING THE COLONOSCOPY: No aspirin products for 7 days or as instructed.  No alcohol for 7 days or as instructed.  Eat a soft diet for the next 24 hours.  FINDING OUT THE RESULTS OF YOUR TEST Not all test results are available during your visit. If your test results are not back during the visit, make an appointment with your caregiver to find out the  results. Do not assume everything is normal if you have not heard from your caregiver or the medical facility. It is important for you to follow up on all of your test results.  SEEK IMMEDIATE MEDICAL ATTENTION IF: You have more than a spotting of blood in your stool.  Your belly is swollen (abdominal distention).  You are nauseated or vomiting.  You have a temperature over 101.  You have abdominal pain or discomfort that is severe or gets worse throughout the day.     3 polyps removed from your colon today  Further recommendations to follow pending review of pathology report   at patient request, I called Sherre Lain at [phone] discussed findings and recommendations

## 2022-06-15 NOTE — Transfer of Care (Signed)
Immediate Anesthesia Transfer of Care Note  Patient: Melanie Cordova  Procedure(s) Performed: COLONOSCOPY WITH PROPOFOL POLYPECTOMY  Patient Location: PACU  Anesthesia Type:General  Level of Consciousness: awake, alert , oriented, and patient cooperative  Airway & Oxygen Therapy: Patient Spontanous Breathing  Post-op Assessment: Report given to RN, Post -op Vital signs reviewed and stable, and Patient moving all extremities X 4  Post vital signs: Reviewed and stable  Last Vitals:  Vitals Value Taken Time  BP 91/53 06/15/22 1122  Temp 36.7 C 06/15/22 1122  Pulse 84 06/15/22 1122  Resp 20 06/15/22 1122  SpO2 100 % 06/15/22 1122    Last Pain:  Vitals:   06/15/22 1122  TempSrc: Oral  PainSc: 0-No pain      Patients Stated Pain Goal: 5 ([id number])  Complications: No notable events documented.

## 2022-06-15 NOTE — Op Note (Signed)
[facility] Patient Name: Melanie Cordova Procedure Date: 06/15/2022 10:39 AM MRN: [id number] Date of Birth: 1960-06-22 Attending MD: Norvel Richards , MD, [id number] CSN: [id number] Age: 62 Admit Type: Outpatient Procedure:                Colonoscopy Indications:              High risk colon cancer surveillance: Personal                            history of colonic polyps Providers:                Norvel Richards, MD, Caprice Kluver, Raphael Gibney, Technician Referring MD:              Medicines:                Propofol per Anesthesia Complications:            No immediate complications. Estimated Blood Loss:     Estimated blood loss was minimal. Procedure:                Pre-Anesthesia Assessment:                           - Prior to the procedure, a History and Physical                            was performed, and patient medications and                            allergies were reviewed. The patient's tolerance of                            previous anesthesia was also reviewed. The risks                            and benefits of the procedure and the sedation                            options and risks were discussed with the patient.                            All questions were answered, and informed consent                            was obtained. Prior Anticoagulants: The patient has                            taken no anticoagulant or antiplatelet agents. ASA                            Grade Assessment: III - A patient with severe  systemic disease. After reviewing the risks and                            benefits, the patient was deemed in satisfactory                            condition to undergo the procedure.                           After obtaining informed consent, the colonoscope                            was passed under direct vision. Throughout the                            procedure, the  patient's blood pressure, pulse, and                            oxygen saturations were monitored continuously. The                            [id number]) scope was introduced through the                            anus and advanced to the the cecum, identified by                            appendiceal orifice and ileocecal valve. The                            colonoscopy was performed without difficulty. The                            patient tolerated the procedure well. The quality                            of the bowel preparation was adequate. The                            ileocecal valve, appendiceal orifice, and rectum                            were photographed. The entire colon was well                            visualized. Scope In: 10:58:31 AM Scope Out: 11:18:14 AM Scope Withdrawal Time: 0 hours 13 minutes 43 seconds  Total Procedure Duration: 0 hours 19 minutes 43 seconds  Findings:      The perianal and digital rectal examinations were normal.      Three semi-pedunculated polyps were found in the mid transverse colon.       The polyps were 6 to 7 mm in size. These polyps were removed with a cold       snare. Resection and retrieval were complete. Estimated blood loss was  minimal.      The exam was otherwise without abnormality on direct and retroflexion       views. Impression:               - Three 6 to 7 mm polyps in the mid transverse                            colon, removed with a cold snare. Resected and                            retrieved.                           - The examination was otherwise normal on direct                            and retroflexion views. Moderate Sedation:      Moderate (conscious) sedation was personally administered by an       anesthesia professional. The following parameters were monitored: oxygen       saturation, heart rate, blood pressure, respiratory rate, EKG, adequacy       of pulmonary ventilation, and  response to care. Recommendation:           - Patient has a contact number available for                            emergencies. The signs and symptoms of potential                            delayed complications were discussed with the                            patient. Return to normal activities tomorrow.                            Written discharge instructions were provided to the                            patient.                           - Advance diet as tolerated.                           - Continue present medications.                           - Repeat colonoscopy date to be determined after                            pending pathology results are reviewed for                            surveillance.                           - Return to  GI office (date not yet determined). Procedure Code(s):        --- Professional ---                           [id number], Colonoscopy, flexible; with removal of                            tumor(s), polyp(s), or other lesion(s) by snare                            technique Diagnosis Code(s):        --- Professional ---                           Z86.010, Personal history of colonic polyps                           D12.3, Benign neoplasm of transverse colon (hepatic                            flexure or splenic flexure) CPT copyright 2022 American Medical Association. All rights reserved. The codes documented in this report are preliminary and upon coder review may  be revised to meet current compliance requirements. Cristopher Estimable. Alexandrya Chim, MD Norvel Richards, MD 06/15/2022 11:27:58 AM This report has been signed electronically. Number of Addenda: 0

## 2022-06-15 NOTE — Anesthesia Preprocedure Evaluation (Signed)
Anesthesia Evaluation  Patient identified by MRN, date of birth, ID band Patient awake    Reviewed: Allergy & Precautions, H&P , NPO status , Patient's Chart, lab work & pertinent test results, reviewed documented beta blocker date and time   Airway Mallampati: II  TM Distance: >3 FB Neck ROM: full    Dental no notable dental hx.    Pulmonary neg pulmonary ROS, shortness of breath, asthma , pneumonia, COPD, Current Smoker and Patient abstained from smoking.   Pulmonary exam normal breath sounds clear to auscultation       Cardiovascular Exercise Tolerance: Good hypertension, +CHF  negative cardio ROS + dysrhythmias + Cardiac Defibrillator  Rhythm:regular Rate:Normal     Neuro/Psych  Headaches, Seizures -,   Anxiety     TIA Neuromuscular disease CVA negative neurological ROS  negative psych ROS   GI/Hepatic negative GI ROS, Neg liver ROS,GERD  ,,  Endo/Other  negative endocrine ROSHypothyroidism    Renal/GU Renal diseasenegative Renal ROS  negative genitourinary   Musculoskeletal   Abdominal   Peds  Hematology negative hematology ROS (+)   Anesthesia Other Findings   Reproductive/Obstetrics negative OB ROS                             Anesthesia Physical Anesthesia Plan  ASA: 3  Anesthesia Plan: General   Post-op Pain Management:    Induction:   PONV Risk Score and Plan: Propofol infusion  Airway Management Planned:   Additional Equipment:   Intra-op Plan:   Post-operative Plan:   Informed Consent: I have reviewed the patients History and Physical, chart, labs and discussed the procedure including the risks, benefits and alternatives for the proposed anesthesia with the patient or authorized representative who has indicated his/her understanding and acceptance.     Dental Advisory Given  Plan Discussed with: CRNA  Anesthesia Plan Comments:        Anesthesia Quick  Evaluation

## 2022-06-15 NOTE — Interval H&P Note (Signed)
History and Physical Interval Note:  06/15/2022 10:42 AM  Melanie Cordova  has presented today for surgery, with the diagnosis of hx colon polyps.  The various methods of treatment have been discussed with the patient and family. After consideration of risks, benefits and other options for treatment, the patient has consented to  Procedure(s) with comments: COLONOSCOPY WITH PROPOFOL (N/A) - 11:45am, asa 3 as a surgical intervention.  The patient's history has been reviewed, patient examined, no change in status, stable for surgery.  I have reviewed the patient's chart and labs.  Questions were answered to the patient's satisfaction.     Daran Favaro   no change.  Discussed with anesthesia.  Surveillance colonoscopy per plan today.The risks, benefits, limitations, alternatives and imponderables have been reviewed with the patient. Questions have been answered. All parties are agreeable.

## 2022-06-16 ENCOUNTER — Encounter: Payer: Self-pay | Admitting: Internal Medicine

## 2022-06-16 LAB — SURGICAL PATHOLOGY

## 2022-06-17 NOTE — Anesthesia Postprocedure Evaluation (Signed)
Anesthesia Post Note  Patient: Melanie Cordova  Procedure(s) Performed: COLONOSCOPY WITH PROPOFOL POLYPECTOMY  Patient location during evaluation: Phase II Anesthesia Type: General Level of consciousness: awake Pain management: pain level controlled Vital Signs Assessment: post-procedure vital signs reviewed and stable Respiratory status: spontaneous breathing and respiratory function stable Cardiovascular status: blood pressure returned to baseline and stable Postop Assessment: no headache and no apparent nausea or vomiting Anesthetic complications: no Comments: Late entry   No notable events documented.   Last Vitals:  Vitals:   06/15/22 1009 06/15/22 1122  BP: 94/60 (!) 91/53  Pulse: 84 84  Resp: 16 20  Temp: 36.8 C 36.7 C  SpO2: 100% 100%    Last Pain:  Vitals:   06/15/22 1122  TempSrc: Oral  PainSc: 0-No pain                 Windell Norfolk

## 2022-06-20 ENCOUNTER — Encounter (HOSPITAL_COMMUNITY): Payer: Self-pay | Admitting: Urology

## 2022-06-22 ENCOUNTER — Encounter (HOSPITAL_COMMUNITY): Payer: Self-pay | Admitting: Internal Medicine

## 2022-06-24 ENCOUNTER — Ambulatory Visit (INDEPENDENT_AMBULATORY_CARE_PROVIDER_SITE_OTHER): Payer: 59 | Admitting: Urology

## 2022-06-24 VITALS — BP 101/64 | HR 80

## 2022-06-24 DIAGNOSIS — Z87442 Personal history of urinary calculi: Secondary | ICD-10-CM | POA: Diagnosis not present

## 2022-06-24 DIAGNOSIS — N201 Calculus of ureter: Secondary | ICD-10-CM

## 2022-06-24 DIAGNOSIS — Z466 Encounter for fitting and adjustment of urinary device: Secondary | ICD-10-CM

## 2022-06-24 LAB — URINALYSIS, ROUTINE W REFLEX MICROSCOPIC
Bilirubin, UA: NEGATIVE
Glucose, UA: NEGATIVE
Ketones, UA: NEGATIVE
Nitrite, UA: NEGATIVE
Specific Gravity, UA: 1.015 (ref 1.005–1.030)
Urobilinogen, Ur: 0.2 mg/dL (ref 0.2–1.0)
pH, UA: 5.5 (ref 5.0–7.5)

## 2022-06-24 LAB — MICROSCOPIC EXAMINATION
RBC, Urine: >30 /hpf — AB (ref 0–2)
WBC, UA: >30 /hpf — AB (ref 0–5)

## 2022-06-24 MED ORDER — CIPROFLOXACIN HCL 500 MG PO TABS
500.0000 mg | ORAL_TABLET | Freq: Once | ORAL | Status: AC
Start: 2022-06-24 — End: 2022-06-24
  Administered 2022-06-24: 500 mg via ORAL

## 2022-06-24 NOTE — Progress Notes (Signed)
   06/24/22  CC: followuop nephrolithiasis  HPI: Melanie Cordova is a 61yo here for stent removal Blood pressure 101/64, pulse 80. NED. A&Ox3.   No respiratory distress   Abd soft, NT, ND Normal external genitalia with patent urethral meatus  Cystoscopy Procedure Note  Patient identification was confirmed, informed consent was obtained, and patient was prepped using Betadine solution.  Lidocaine jelly was administered per urethral meatus.    Procedure: - Flexible cystoscope introduced, without any difficulty.   - Thorough search of the bladder revealed:    normal urethral meatus    normal urothelium    no stones    no ulcers     no tumors    no urethral polyps    no trabeculation  - Ureteral orifices were normal in position and appearance. -Using a grasper the right ureteral stent was removed intact.  Post-Procedure: - Patient tolerated the procedure well  Assessment/ Plan: Followup 6 weeks with a renal US   No follow-ups on file.  Wilkie Aye, MD

## 2022-06-27 LAB — CALCULI, WITH PHOTOGRAPH (CLINICAL LAB)
Calcium Oxalate Monohydrate: 5 %
Hydroxyapatite: 95 %
Size Calculi: <1 mm
Weight Calculi: <1 mg

## 2022-06-28 ENCOUNTER — Encounter: Payer: Self-pay | Admitting: Urology

## 2022-06-28 NOTE — Patient Instructions (Signed)

## 2022-06-30 ENCOUNTER — Other Ambulatory Visit: Payer: Self-pay | Admitting: Urology

## 2022-07-15 ENCOUNTER — Other Ambulatory Visit: Payer: Self-pay | Admitting: Urology

## 2022-08-11 ENCOUNTER — Other Ambulatory Visit: Payer: Self-pay

## 2022-08-11 DIAGNOSIS — N201 Calculus of ureter: Secondary | ICD-10-CM

## 2022-08-12 ENCOUNTER — Ambulatory Visit (INDEPENDENT_AMBULATORY_CARE_PROVIDER_SITE_OTHER): Payer: 59 | Admitting: Urology

## 2022-08-12 VITALS — BP 119/73 | HR 82

## 2022-08-12 DIAGNOSIS — N201 Calculus of ureter: Secondary | ICD-10-CM

## 2022-08-18 ENCOUNTER — Other Ambulatory Visit: Payer: Self-pay | Admitting: Urology

## 2022-08-23 NOTE — Progress Notes (Signed)
Visit cancelled.

## 2022-08-24 ENCOUNTER — Ambulatory Visit (HOSPITAL_COMMUNITY): Payer: 59 | Attending: Urology

## 2022-09-14 ENCOUNTER — Ambulatory Visit (HOSPITAL_COMMUNITY)
Admission: RE | Admit: 2022-09-14 | Discharge: 2022-09-14 | Disposition: A | Discharge status: Home / Self Care | Payer: 59 | Source: Ambulatory Visit | Attending: Urology | Admitting: Urology

## 2022-09-14 DIAGNOSIS — N201 Calculus of ureter: Secondary | ICD-10-CM | POA: Insufficient documentation

## 2022-09-15 ENCOUNTER — Other Ambulatory Visit: Payer: Self-pay | Admitting: Urology

## 2022-09-29 ENCOUNTER — Encounter: Payer: Self-pay | Admitting: Internal Medicine

## 2022-09-29 ENCOUNTER — Ambulatory Visit: Payer: 59 | Attending: Internal Medicine | Admitting: Internal Medicine

## 2022-09-29 VITALS — BP 108/62 | HR 78 | Ht 67.5 in | Wt 205.8 lb

## 2022-09-29 DIAGNOSIS — I5022 Chronic systolic (congestive) heart failure: Secondary | ICD-10-CM

## 2022-09-29 DIAGNOSIS — I428 Other cardiomyopathies: Secondary | ICD-10-CM | POA: Diagnosis not present

## 2022-09-29 DIAGNOSIS — Z95810 Presence of automatic (implantable) cardiac defibrillator: Secondary | ICD-10-CM

## 2022-09-29 NOTE — Patient Instructions (Signed)
Medication Instructions: ` Your physician recommends that you continue on your current medications as directed. Please refer to the Current Medication list given to you today.  *If you need a refill on your cardiac medications before your next appointment, please call your pharmacy*   Lab Work: None ordered.  If you have labs (blood work) drawn today and your tests are completely normal, you will receive your results only by: MyChart Message (if you have MyChart) OR A paper copy in the mail If you have any lab test that is abnormal or we need to change your treatment, we will call you to review the results.   Testing/Procedures: None ordered.    Follow-Up: At [facility], you and your health needs are our priority.  As part of our continuing mission to provide you with exceptional heart care, we have created designated Provider Care Teams.  These Care Teams include your primary Cardiologist (physician) and Advanced Practice Providers (APPs -  Physician Assistants and Nurse Practitioners) who all work together to provide you with the care you need, when you need it.  We recommend signing up for the patient portal called "MyChart".  Sign up information is provided on this After Visit Summary.  MyChart is used to connect with patients for Virtual Visits (Telemedicine).  Patients are able to view lab/test results, encounter notes, upcoming appointments, etc.  Non-urgent messages can be sent to your provider as well.   To learn more about what you can do with MyChart, go to [email or web address].    Your next appointment:   12 months with Dr Ladona Ridgel in the [place] office

## 2022-09-29 NOTE — Progress Notes (Signed)
HPI  Melanie Cordova returns today for followup. She is a pleasant 62yo woman with a h/o morbid obesity and a non-ischemic CM. Since her ICD was placed and her EF 15%, she has had a slow and gradual improvement in her LV function and CHF symptoms. Her EF most recently a year ago was 45-50% and she has not received any ICD therapies. She has class 1 symptoms. She has lost over 100 lbs by changing her diet. She eats fruits/vegetables and whole grains. She has increased her activity. No syncope and no edema.     Current Outpatient Medications  Medication Sig Dispense Refill   acetaminophen (TYLENOL) 650 MG CR tablet Take 1,300 mg by mouth at bedtime.     albuterol (PROVENTIL HFA;VENTOLIN HFA) 108 (90 BASE) MCG/ACT inhaler Inhale 2 puffs into the lungs every 6 (six) hours as needed for wheezing or shortness of breath. 1 Inhaler 5   alfuzosin (UROXATRAL) 10 MG 24 hr tablet TAKE ONE TABLET BY MOUTH DAILY WITH BREAKFAST 30 tablet 1   aspirin EC 81 MG tablet Take 1 tablet (81 mg total) by mouth daily. 21 tablet 0   Biotin 5000 MCG CAPS Take 25,000 mcg by mouth daily.     budesonide-formoterol (SYMBICORT) 80-4.5 MCG/ACT inhaler Inhale 2 puffs into the lungs 2 (two) times daily.     carvedilol (COREG) 12.5 MG tablet Take 25 mg by mouth 2 (two) times daily with a meal.     cetirizine (ZYRTEC) 10 MG tablet Take 10 mg by mouth daily.     clobetasol (TEMOVATE) 0.05 % external solution Apply 1 application  topically once a week. Apply to scalp     clopidogrel (PLAVIX) 75 MG tablet Take 1 tablet (75 mg total) by mouth daily. 21 tablet 0   ENTRESTO 24-26 MG TAKE ONE TABLET BY MOUTH 2 TIMES A DAY (Patient taking differently: Take 1 tablet by mouth 2 (two) times daily.) 60 tablet 0   famotidine (PEPCID) 20 MG tablet Take 20 mg by mouth daily as needed for heartburn or indigestion.     ferrous sulfate 325 (65 FE) MG EC tablet Take 325 mg by mouth daily.     fluticasone (FLONASE) 50 MCG/ACT nasal spray Place  2 sprays into both nostrils daily.     furosemide (LASIX) 40 MG tablet Take 1 tablet (40 mg total) by mouth 2 (two) times daily. 180 tablet 0   ketoconazole (NIZORAL) 2 % cream Apply 1 application  topically 2 (two) times daily as needed (psoriasis).     levothyroxine (SYNTHROID) 175 MCG tablet Take 175 mcg by mouth daily before breakfast.     Multiple Vitamins-Minerals (CENTRAVITES 50 PLUS PO) Take 1 tablet by mouth daily.      ondansetron (ZOFRAN) 4 MG tablet Take 1 tablet (4 mg total) by mouth daily as needed for nausea or vomiting. 30 tablet 1   ondansetron (ZOFRAN) 4 MG tablet TAKE ONE TABLET BY MOUTH DAILY AS NEEDED FOR NAUSEA AND VOMITING 30 tablet 1   potassium chloride SA (K-DUR) 20 MEQ tablet Take 20 mEq by mouth daily.      rosuvastatin (CRESTOR) 20 MG tablet Take 20 mg by mouth daily.     SENNA-TIME 8.6 MG tablet TAKE ONE TABLET BY MOUTH DAILY 30 tablet 0   topiramate (TOPAMAX) 100 MG tablet TAKE ONE TABLET BY MOUTH AT BEDTIME 90 tablet 2   Ubrogepant (UBRELVY) 50 MG TABS Take 50 mg by mouth as needed. Can repeat a  dose in 2 hours if headache persists 16 tablet 2   Vitamin D, Ergocalciferol, (DRISDOL) 1.25 MG (50000 UNIT) CAPS capsule Take 50,000 Units by mouth every 30 (thirty) days.     No current facility-administered medications for this visit.     Past Medical History:  Diagnosis Date   Anxiety    Arthritis    Asthma    Automatic implantable cardioverter-defibrillator in situ    2013, july   Bell palsy    states has had 3 episodes   Cardiomyopathy 08/2010   Presented with congestive heart failure; EF of 15% and 2012; hypotension on medication precludes optimal dosing   Chest pain 12/28/2017   CHF (congestive heart failure) (HCC)    a. EF 20% in 2012 with low-risk NST --> St. Jude ICD implantation by Dr. Ladona Ridgel in 09/2011 b. NST in 12/2017 showing evidence of prior infarct without ischemia c. EF at 45-50% by echo in 06/2019   Chronic systolic heart failure (HCC)     COPD (chronic obstructive pulmonary disease) (HCC)    2013   Dysrhythmia    Gastroesophageal reflux disease    Headache(784.0)    Hot flashes 10/04/2016   Hyperlipidemia    Hypertension    09/2010-normal CMet and CBC; Lipid profile-116, 88, 25, 73   Hypothyroidism    Recent TSH was normal.   ICD (implantable cardiac defibrillator) in place 10/10/2011   Neuromuscular disorder (HCC)    Neuropathy, right foot and leg   Neuropathy    Obesity    Pneumonia    Seizures (HCC)    last one at age 37   Shortness of breath    Stroke (HCC)     stroke in 07/2012 and another in June, 2014   Tobacco abuse    20 pack years    ROS:   All systems reviewed and negative except as noted in the HPI.   Past Surgical History:  Procedure Laterality Date   CESAREAN SECTION     X2   COLONOSCOPY N/A 12/16/2013   One simple adenoma and 3 hyperplastic polyps. Small internal hemorrhoids. Left colon redundant. Due for surveillance 2020.    COLONOSCOPY WITH PROPOFOL N/A 06/04/2019   Procedure: COLONOSCOPY WITH PROPOFOL;  Surgeon: West Bali, MD;  Location: [facility];  Service: Endoscopy;  Laterality: N/A;  10:30am   COLONOSCOPY WITH PROPOFOL N/A 06/15/2022   Procedure: COLONOSCOPY WITH PROPOFOL;  Surgeon: Corbin Ade, MD;  Location: [facility];  Service: Endoscopy;  Laterality: N/A;  11:45am, asa 3   CYSTOSCOPY W/ URETERAL STENT PLACEMENT Bilateral 03/01/2022   Procedure: CYSTOSCOPY WITH RETROGRADE PYELOGRAM/URETERAL STENT PLACEMENT;  Surgeon: Malen Gauze, MD;  Location: [facility];  Service: Urology;  Laterality: Bilateral;   CYSTOSCOPY WITH RETROGRADE PYELOGRAM, URETEROSCOPY AND STENT PLACEMENT Left 05/05/2022   Procedure: CYSTOSCOPY WITH RETROGRADE PYELOGRAM, URETEROSCOPY AND STENT EXCHANGE;  Surgeon: Malen Gauze, MD;  Location: [facility];  Service: Urology;  Laterality: Left;   CYSTOSCOPY WITH RETROGRADE PYELOGRAM, URETEROSCOPY AND STENT PLACEMENT Right 06/09/2022   Procedure:  CYSTOSCOPY WITH RETROGRADE PYELOGRAM, URETEROSCOPY AND STENT EXCHANGE;  Surgeon: Malen Gauze, MD;  Location: [facility];  Service: Urology;  Laterality: Right;   EP IMPLANTABLE DEVICE     St. Jude   EP IMPLANTABLE DEVICE N/A 08/13/2015   Procedure: Loop Recorder Insertion;  Surgeon: Marinus Maw, MD;  Location: [facility];  Service: Cardiovascular;  Laterality: N/A;   HOLMIUM LASER APPLICATION Left 05/05/2022   Procedure:  HOLMIUM LASER APPLICATION;  Surgeon: Malen Gauze, MD;  Location: [facility];  Service: Urology;  Laterality: Left;   HOLMIUM LASER APPLICATION Right 06/09/2022   Procedure: HOLMIUM LASER APPLICATION;  Surgeon: Malen Gauze, MD;  Location: [facility];  Service: Urology;  Laterality: Right;   IMPLANTABLE CARDIOVERTER DEFIBRILLATOR IMPLANT N/A 10/10/2011   Procedure: IMPLANTABLE CARDIOVERTER DEFIBRILLATOR IMPLANT;  Surgeon: Marinus Maw, MD;  Location: [facility] CATH LAB;  Service: Cardiovascular;  Laterality: N/A;   MULTIPLE EXTRACTIONS WITH ALVEOLOPLASTY N/A 09/24/2012   Procedure: MULTIPLE EXTRACION #2, 4, 6, 7 ,8, 9, 11, 13, 18, 20, 21, 22, 23, 24, 25, 26, 27, 29 WITH ALVEOLOPLASTY, BIOPSY OF PALATE LESION, REMOVA RIGHT LINGUAL TORUS;  Surgeon: Georgia Lopes, DDS;  Location: [facility] OR;  Service: Oral Surgery;  Laterality: N/A;   POLYPECTOMY  06/04/2019   Procedure: POLYPECTOMY;  Surgeon: West Bali, MD;  [facility]: [facility];  Service: Endoscopy;;   POLYPECTOMY  06/15/2022   Procedure: POLYPECTOMY;  Surgeon: Corbin Ade, MD;  [facility]: [facility];  Service: Endoscopy;;   TEE WITHOUT CARDIOVERSION N/A 07/11/2012   Procedure: TRANSESOPHAGEAL ECHOCARDIOGRAM (TEE);  Surgeon: Vesta Mixer, MD;  Location: [facility] ENDOSCOPY;  Service: Cardiovascular;  Laterality: N/A;   TUBAL LIGATION       Family History  Problem Relation Age of Onset   Cardiomyopathy Mother        ICD pacemaker-ischmic CM   Heart failure Mother    Hypertension Mother    Diabetes Mother     Thyroid disease Mother    Cardiomyopathy Father        Deceased   Coronary artery disease Father    Heart failure Father    Heart failure Brother    Hypertension Brother    Endometriosis Sister    Heart disease Paternal Grandfather    Heart disease Paternal Grandmother    Heart disease Maternal Grandmother    Heart disease Maternal Grandfather    Hypertension Daughter    Obesity Daughter    Colon cancer Neg Hx    Liver disease Neg Hx      Social History   Socioeconomic History   Marital status: Divorced    Spouse name: Not on file   Number of children: 2   Years of education: Not on file   Highest education level: Not on file  Occupational History    Employer: [employer]    Comment: Works in Science writer  Tobacco Use   Smoking status: Every Day    Current packs/day: 0.75    Average packs/day: 0.8 packs/day for 39.2 years (29.4 ttl pk-yrs)    Types: Cigarettes    Start date: 07/02/1983   Smokeless tobacco: Never  Vaping Use   Vaping status: Former  Substance and Sexual Activity   Alcohol use: No    Alcohol/week: 0.0 standard drinks of alcohol   Drug use: No   Sexual activity: Not Currently    Birth control/protection: Abstinence, Surgical    Comment: tubal  Other Topics Concern   Not on file  Social History Narrative   Not on file   Social Determinants of Health   Financial Resource Strain: Not on file  Food Insecurity: No Food Insecurity (03/01/2022)   Hunger Vital Sign    Worried About Running Out of Food in the Last Year: Never true    Ran Out of Food in the Last Year: Never true  Transportation Needs: No Transportation Needs (03/01/2022)  PRAPARE - Administrator, Civil Service (Medical): No    Lack of Transportation (Non-Medical): No  Physical Activity: Not on file  Stress: Not on file  Social Connections: Not on file  Intimate Partner Violence: Not At Risk (03/01/2022)   Humiliation, Afraid, Rape, and Kick questionnaire     Fear of Current or Ex-Partner: No    Emotionally Abused: No    Physically Abused: No    Sexually Abused: No     BP 108/62   Pulse 78   Ht 5' 7.5" (1.715 m)   Wt 205 lb 12.8 oz (93.4 kg)   SpO2 99%   BMI 31.76 kg/m   Physical Exam:  Well appearing NAD HEENT: Unremarkable Neck:  No JVD, no thyromegally Lymphatics:  No adenopathy Back:  No CVA tenderness Lungs:  Clear HEART:  Regular rate rhythm, no murmurs, no rubs, no clicks Abd:  soft, positive bowel sounds, no organomegally, no rebound, no guarding Ext:  2 plus pulses, no edema, no cyanosis, no clubbing Skin:  No rashes no nodules Neuro:  CN II through XII intact, motor grossly intact  DEVICE  Normal device function.  See PaceArt for details.   Assess/Plan:  Chronic systolic heart failure - her EF has improved and her CHF is class 1. I have recommended she continue her current meds. ICD - as her EF has improved I do not recommend placing an additional device.  Obesity - she continues to lose weight. I have encouraged her to try and get under 200 lbs. HTN - her bp is well controlled. She will continue her current meds  Melanie Gowda Riyana Biel,MD

## 2022-10-03 ENCOUNTER — Ambulatory Visit: Payer: 59 | Admitting: Gastroenterology

## 2022-10-03 ENCOUNTER — Encounter: Payer: Self-pay | Admitting: Gastroenterology

## 2022-10-03 VITALS — BP 103/65 | HR 80 | Temp 97.8°F | Ht 67.5 in | Wt 208.8 lb

## 2022-10-03 DIAGNOSIS — R140 Abdominal distension (gaseous): Secondary | ICD-10-CM | POA: Diagnosis not present

## 2022-10-03 DIAGNOSIS — K5904 Chronic idiopathic constipation: Secondary | ICD-10-CM | POA: Diagnosis not present

## 2022-10-03 DIAGNOSIS — Z86010 Personal history of colonic polyps: Secondary | ICD-10-CM

## 2022-10-03 NOTE — Progress Notes (Signed)
GI Office Note    Referring Provider: Benetta Spar* Primary Care Physician:  Benetta Spar, MD Primary Gastroenterologist: Gerrit Friends.Rourk, MD  Date:  10/03/2022  ID:  [name], DOB Jul 31, 1960, MRN [id number]   Chief Complaint   Chief Complaint  Patient presents with   Follow-up    Having some gas   History of Present Illness  [place] Mccullum is a 62 y.o. female with a history of anxiety, asthma, Bell's palsy, CHF with cardiomyopathy s/p ICD placement, COPD, GERD, HLD, HTN, hypothyroidism, seizures, tobacco use, and constipation presenting today for follow-up with complaint of gas.  Colonoscopy in 2015 with 1 cecal adenoma and 3 hyperplastic polyps.  Small internal hemorrhoids.  Redundant left colon.   Colonoscopy March 2021: -6 sessile polyps in the descending, mid transverse, distal transverse, and ascending colon.  Polyps ranging 4-8 mm in size. -External and internal hemorrhoids -Mildly tortuous rectosigmoid and sigmoid colon -Ascending follow-up revealed to be sessile serrated polyp -Transverse and descending polyps revealed to be tubular adenomas without high-grade dysplasia -Advised follow-up in 3 years   Last lab work with Hgb 11.7 on 03/02/22. MCV 92.6.  Echo March 2023 with EF 40 to 45%.  Last office visit 05/31/2022.  Patient reported history of constipation with need to self disimpact herself.  BM every day or sometimes more frequently but usually very small amounts.  Used to use correct all but recently not using any over-the-counter regimens due to fear of interactions with her other medications.  PCP had recently taken her off of reflux medication but she denied any reflux symptoms.  Reported some dark stools and at times of bright red blood in her stools but has noted to be on iron for a long time.  She reported a 85 pound weight loss over the last year.  Had recently had some issues with kidney stones.  ICD in place but not working  properly and states it was too risky to remove.  Colonoscopy 06/15/22: -Three 6- 7 mm polyps in the mid transverse colon -Exam otherwise normal -Path: Tubular adenomatous -Repeat colonoscopy in 5 years   Today: Taking miralax at least once per day. Has increased her intake of fiber and her water (only allowed 4 bottles daily due to her heart issues). She has been exercising. No abdominal pain, N/V, dysphagia. GERD controlled with pepcid.   Has not tried taking a stool softener. Still doing some self disimpaction. Sometimes comes out on her own.  Most times stools bristol 2 and she has to help it out and other times it is bristol 4. Has rare bristol 7 stools. Has more frequent # 2 than #4. Gas is bothering her more than anything.   Had good relief of her symptoms for quite a few days after the colonoscopy prep.  Uses sleepy time tea sometimes.   Saw cardiology Friday and got good report per patient.   Current Outpatient Medications  Medication Sig Dispense Refill   acetaminophen (TYLENOL) 650 MG CR tablet Take 1,300 mg by mouth at bedtime.     albuterol (PROVENTIL HFA;VENTOLIN HFA) 108 (90 BASE) MCG/ACT inhaler Inhale 2 puffs into the lungs every 6 (six) hours as needed for wheezing or shortness of breath. 1 Inhaler 5   alfuzosin (UROXATRAL) 10 MG 24 hr tablet TAKE ONE TABLET BY MOUTH DAILY WITH BREAKFAST 30 tablet 1   aspirin EC 81 MG tablet Take 1 tablet (81 mg total) by mouth daily. 21 tablet 0   Biotin  5000 MCG CAPS Take 25,000 mcg by mouth daily.     budesonide-formoterol (SYMBICORT) 80-4.5 MCG/ACT inhaler Inhale 2 puffs into the lungs 2 (two) times daily.     carvedilol (COREG) 12.5 MG tablet Take 25 mg by mouth 2 (two) times daily with a meal.     cetirizine (ZYRTEC) 10 MG tablet Take 10 mg by mouth daily.     clobetasol (TEMOVATE) 0.05 % external solution Apply 1 application  topically once a week. Apply to scalp     clopidogrel (PLAVIX) 75 MG tablet Take 1 tablet (75 mg total) by  mouth daily. 21 tablet 0   ENTRESTO 24-26 MG TAKE ONE TABLET BY MOUTH 2 TIMES A DAY (Patient taking differently: Take 1 tablet by mouth 2 (two) times daily.) 60 tablet 0   famotidine (PEPCID) 20 MG tablet Take 20 mg by mouth daily as needed for heartburn or indigestion.     ferrous sulfate 325 (65 FE) MG EC tablet Take 325 mg by mouth daily.     fluticasone (FLONASE) 50 MCG/ACT nasal spray Place 2 sprays into both nostrils daily.     furosemide (LASIX) 40 MG tablet Take 1 tablet (40 mg total) by mouth 2 (two) times daily. 180 tablet 0   ketoconazole (NIZORAL) 2 % cream Apply 1 application  topically 2 (two) times daily as needed (psoriasis).     levothyroxine (SYNTHROID) 175 MCG tablet Take 175 mcg by mouth daily before breakfast.     Multiple Vitamins-Minerals (CENTRAVITES 50 PLUS PO) Take 1 tablet by mouth daily.      ondansetron (ZOFRAN) 4 MG tablet Take 1 tablet (4 mg total) by mouth daily as needed for nausea or vomiting. 30 tablet 1   ondansetron (ZOFRAN) 4 MG tablet TAKE ONE TABLET BY MOUTH DAILY AS NEEDED FOR NAUSEA AND VOMITING 30 tablet 1   potassium chloride SA (K-DUR) 20 MEQ tablet Take 20 mEq by mouth daily.      rosuvastatin (CRESTOR) 20 MG tablet Take 20 mg by mouth daily.     SENNA-TIME 8.6 MG tablet TAKE ONE TABLET BY MOUTH DAILY 30 tablet 0   topiramate (TOPAMAX) 100 MG tablet TAKE ONE TABLET BY MOUTH AT BEDTIME 90 tablet 2   Ubrogepant (UBRELVY) 50 MG TABS Take 50 mg by mouth as needed. Can repeat a dose in 2 hours if headache persists 16 tablet 2   Vitamin D, Ergocalciferol, (DRISDOL) 1.25 MG (50000 UNIT) CAPS capsule Take 50,000 Units by mouth every 30 (thirty) days.     No current facility-administered medications for this visit.    Past Medical History:  Diagnosis Date   Anxiety    Arthritis    Asthma    Automatic implantable cardioverter-defibrillator in situ    2013, july   Bell palsy    states has had 3 episodes   Cardiomyopathy 08/2010   Presented with  congestive heart failure; EF of 15% and 2012; hypotension on medication precludes optimal dosing   Chest pain 12/28/2017   CHF (congestive heart failure) (HCC)    a. EF 20% in 2012 with low-risk NST --> St. Jude ICD implantation by Dr. Ladona Ridgel in 09/2011 b. NST in 12/2017 showing evidence of prior infarct without ischemia c. EF at 45-50% by echo in 06/2019   Chronic systolic heart failure (HCC)    COPD (chronic obstructive pulmonary disease) (HCC)    2013   Dysrhythmia    Gastroesophageal reflux disease    Headache(784.0)    Hot flashes 10/04/2016  Hyperlipidemia    Hypertension    09/2010-normal CMet and CBC; Lipid profile-116, 88, 25, 73   Hypothyroidism    Recent TSH was normal.   ICD (implantable cardiac defibrillator) in place 10/10/2011   Neuromuscular disorder (HCC)    Neuropathy, right foot and leg   Neuropathy    Obesity    Pneumonia    Seizures (HCC)    last one at age 55   Shortness of breath    Stroke (HCC)     stroke in 07/2012 and another in June, 2014   Tobacco abuse    20 pack years    Past Surgical History:  Procedure Laterality Date   CESAREAN SECTION     X2   COLONOSCOPY N/A 12/16/2013   One simple adenoma and 3 hyperplastic polyps. Small internal hemorrhoids. Left colon redundant. Due for surveillance 2020.    COLONOSCOPY WITH PROPOFOL N/A 06/04/2019   Procedure: COLONOSCOPY WITH PROPOFOL;  Surgeon: West Bali, MD;  [facility]: [facility];  Service: Endoscopy;  Laterality: N/A;  10:30am   COLONOSCOPY WITH PROPOFOL N/A 06/15/2022   Procedure: COLONOSCOPY WITH PROPOFOL;  Surgeon: Corbin Ade, MD;  Location: [facility];  Service: Endoscopy;  Laterality: N/A;  11:45am, asa 3   CYSTOSCOPY W/ URETERAL STENT PLACEMENT Bilateral 03/01/2022   Procedure: CYSTOSCOPY WITH RETROGRADE PYELOGRAM/URETERAL STENT PLACEMENT;  Surgeon: Malen Gauze, MD;  Location: [facility];  Service: Urology;  Laterality: Bilateral;   CYSTOSCOPY WITH RETROGRADE PYELOGRAM,  URETEROSCOPY AND STENT PLACEMENT Left 05/05/2022   Procedure: CYSTOSCOPY WITH RETROGRADE PYELOGRAM, URETEROSCOPY AND STENT EXCHANGE;  Surgeon: Malen Gauze, MD;  Location: [facility];  Service: Urology;  Laterality: Left;   CYSTOSCOPY WITH RETROGRADE PYELOGRAM, URETEROSCOPY AND STENT PLACEMENT Right 06/09/2022   Procedure: CYSTOSCOPY WITH RETROGRADE PYELOGRAM, URETEROSCOPY AND STENT EXCHANGE;  Surgeon: Malen Gauze, MD;  Location: [facility];  Service: Urology;  Laterality: Right;   EP IMPLANTABLE DEVICE     St. Jude   EP IMPLANTABLE DEVICE N/A 08/13/2015   Procedure: Loop Recorder Insertion;  Surgeon: Marinus Maw, MD;  Location: [facility];  Service: Cardiovascular;  Laterality: N/A;   HOLMIUM LASER APPLICATION Left 05/05/2022   Procedure: HOLMIUM LASER APPLICATION;  Surgeon: Malen Gauze, MD;  Location: [facility];  Service: Urology;  Laterality: Left;   HOLMIUM LASER APPLICATION Right 06/09/2022   Procedure: HOLMIUM LASER APPLICATION;  Surgeon: Malen Gauze, MD;  Location: [facility];  Service: Urology;  Laterality: Right;   IMPLANTABLE CARDIOVERTER DEFIBRILLATOR IMPLANT N/A 10/10/2011   Procedure: IMPLANTABLE CARDIOVERTER DEFIBRILLATOR IMPLANT;  Surgeon: Marinus Maw, MD;  Location: [facility] CATH LAB;  Service: Cardiovascular;  Laterality: N/A;   MULTIPLE EXTRACTIONS WITH ALVEOLOPLASTY N/A 09/24/2012   Procedure: MULTIPLE EXTRACION #2, 4, 6, 7 ,8, 9, 11, 13, 18, 20, 21, 22, 23, 24, 25, 26, 27, 29 WITH ALVEOLOPLASTY, BIOPSY OF PALATE LESION, REMOVA RIGHT LINGUAL TORUS;  Surgeon: Georgia Lopes, DDS;  Location: [facility];  Service: Oral Surgery;  Laterality: N/A;   POLYPECTOMY  06/04/2019   Procedure: POLYPECTOMY;  Surgeon: West Bali, MD;  [facility];  Service: Endoscopy;;   POLYPECTOMY  06/15/2022   Procedure: POLYPECTOMY;  Surgeon: Corbin Ade, MD;  [facility];  Service: Endoscopy;;   TEE WITHOUT CARDIOVERSION N/A 07/11/2012   Procedure: TRANSESOPHAGEAL  ECHOCARDIOGRAM (TEE);  Surgeon: Vesta Mixer, MD;  Location: [facility] ENDOSCOPY;  Service: Cardiovascular;  Laterality: N/A;   TUBAL LIGATION  Family History  Problem Relation Age of Onset   Cardiomyopathy Mother        ICD pacemaker-ischmic CM   Heart failure Mother    Hypertension Mother    Diabetes Mother    Thyroid disease Mother    Cardiomyopathy Father        Deceased   Coronary artery disease Father    Heart failure Father    Heart failure Brother    Hypertension Brother    Endometriosis Sister    Heart disease Paternal Grandfather    Heart disease Paternal Grandmother    Heart disease Maternal Grandmother    Heart disease Maternal Grandfather    Hypertension Daughter    Obesity Daughter    Colon cancer Neg Hx    Liver disease Neg Hx     Allergies as of 10/03/2022 - Review Complete 10/03/2022  Allergen Reaction Noted   Fish allergy Anaphylaxis 03/20/2012   Iodinated contrast media Hives and Other (See Comments) 09/24/2010   Iodine Hives and Other (See Comments) 12/23/2010   Lentil Anaphylaxis 03/20/2012   Penicillins Anaphylaxis and Shortness Of Breath 05/04/2010   Lipitor [atorvastatin] Itching and Rash 07/27/2012   Spironolactone Rash 07/11/2011   Sulfa antibiotics Rash 05/04/2010    Social History   Socioeconomic History   Marital status: Divorced    Spouse name: Not on file   Number of children: 2   Years of education: Not on file   Highest education level: Not on file  Occupational History    Employer: [employer]    Comment: Works in Science writer  Tobacco Use   Smoking status: Every Day    Current packs/day: 0.75    Average packs/day: 0.8 packs/day for 39.3 years (29.4 ttl pk-yrs)    Types: Cigarettes    Start date: 07/02/1983   Smokeless tobacco: Never  Vaping Use   Vaping status: Former  Substance and Sexual Activity   Alcohol use: No    Alcohol/week: 0.0 standard drinks of alcohol   Drug use: No   Sexual activity: Not Currently     Birth control/protection: Abstinence, Surgical    Comment: tubal  Other Topics Concern   Not on file  Social History Narrative   Not on file   Social Determinants of Health   Financial Resource Strain: Not on file  Food Insecurity: No Food Insecurity (03/01/2022)   Hunger Vital Sign    Worried About Running Out of Food in the Last Year: Never true    Ran Out of Food in the Last Year: Never true  Transportation Needs: No Transportation Needs (03/01/2022)   PRAPARE - Administrator, Civil Service (Medical): No    Lack of Transportation (Non-Medical): No  Physical Activity: Not on file  Stress: Not on file  Social Connections: Not on file     Review of Systems   Gen: Denies fever, chills, anorexia. Denies fatigue, weakness, weight loss.  CV: Denies chest pain, palpitations, syncope, peripheral edema, and claudication. Resp: Denies dyspnea at rest, cough, wheezing, coughing up blood, and pleurisy. GI: See HPI Derm: Denies rash, itching, dry skin Psych: Denies depression, anxiety, memory loss, confusion. No homicidal or suicidal ideation.  Heme: Denies bruising, bleeding, and enlarged lymph nodes.  Physical Exam   BP 103/65 (BP Location: Right Arm, Patient Position: Sitting, Cuff Size: Normal)   Pulse 80   Temp 97.8 F (36.6 C) (Temporal)   Ht 5' 7.5" (1.715 m)   Wt 208 lb 12.8  oz (94.7 kg)   SpO2 98%   BMI 32.22 kg/m   General:   Alert and oriented. No distress noted. Pleasant and cooperative.  Head:  Normocephalic and atraumatic. Eyes:  Conjuctiva clear without scleral icterus. Mouth:  Oral mucosa pink and moist. Good dentition. No lesions. Lungs:  Clear to auscultation bilaterally. No wheezes, rales, or rhonchi. No distress.  Heart:  S1, S2 present without murmurs appreciated.  Abdomen:  +BS, soft, non-tender and non-distended. No rebound or guarding. No HSM or masses noted. Rectal: deferred Msk:  Symmetrical without gross deformities. Normal  posture. Extremities:  Without edema. Neurologic:  Alert and  oriented x4 Psych:  Alert and cooperative. Normal mood and affect.  Assessment  NIRVI BOEHLER is a 62 y.o. female with a history of anxiety, asthma, Bell's palsy, CHF with cardiomyopathy s/p ICD placement, COPD, GERD, HLD, HTN, hypothyroidism, seizures, tobacco use, and constipation presenting today for follow-up.  Chronic constipation, gassiness: Has tried and failed Linzess in the past due to hair loss.  Had good response with Trulance with her colonoscopy prep.  After prep she felt great for few days but then began having constipation again.  Currently taking MiraLAX once daily and increasing fiber in her diet.  Is staying adequately hydrated is much as she is allowed given her cardiomyopathy and heart failure.  Has not tried any stool softeners.  Occasionally is still performing self disimpaction on days where she feels like she needs to strain or is having significant Bristol 2 stools.  Recently also having some more frequent gassiness but has admitted to increased consumption of gas producing foods.  She would prefer more homeopathic/over-the-counter regimens for constipation for now therefore advised her to continue MiraLAX nightly and start stool softener twice daily and that she may continue to use peppermint oil or Gas-X as needed.  Discussed use of smooth move tea a few times per week.  Also advised that she could use a suppository as needed rather than self disimpaction to avoid anal trauma.  History of colon polyps: Colonoscopy in 2021 multiple polyps removed and revealed to be sessile serrated adenomas and tubular adenomas.  Most recent colonoscopy in April with multiple tubular adenomas removed in the mid transverse colon.  She was advised to repeat again in 5 years.  PLAN    Miralax 17g once nightly Start colace twice daily Continue high fiber diet If fails otc medications will trial Trulance.  Gas ex or peppermint  oil as needed. Advised to use suppository as needed rather than self disimpaction. Consume smooth move tea few times per week Follow up in 4 months.   Brooke Bonito, MSN, FNP-BC, AGACNP-BC [facility]

## 2022-10-03 NOTE — Patient Instructions (Addendum)
Continue MiraLAX 17 g in 8 ounces of water nightly.  She will start taking a stool softener twice daily. Colace (docusate sodium is generic name).  If you are unable to find this in the pharmacy please asked the pharmacist or someone at the counter.  Continue your high-fiber diet.  As we discussed foods that you are eating such as broccoli, cauliflower, onions, and beans are high in fiber but can also cause extra gas which is why you are passing gas more frequently.  You can continue to use peppermint oil or peppermint candy as well as Gas-X as needed.  Sometimes drinking through a straw or sucking on hard candy though can cause extra gas as well (more commonly with burping).   Please also asked the pharmacist if you are having difficulty finding smooth move tea.  I am not sure if you can get this at your local pharmacy but you can for sure to get this at [facility] or [employer].  You can possibly also find this on [employer].  Instead of self disimpacting yourself I would recommend you use a suppository to help avoid any anal trauma.  We will plan to follow-up again in 4 months.  If you have worsening constipation before your follow-up please call the office and let us know and we can send in Trulance for you to take.  It was a pleasure to see you today. I want to create trusting relationships with patients. If you receive a survey regarding your visit,  I greatly appreciate you taking time to fill this out on paper or through your MyChart. I value your feedback.  Brooke Bonito, MSN, FNP-BC, AGACNP-BC [facility]

## 2022-10-04 ENCOUNTER — Ambulatory Visit (INDEPENDENT_AMBULATORY_CARE_PROVIDER_SITE_OTHER): Payer: 59 | Admitting: Psychiatry

## 2022-10-04 ENCOUNTER — Encounter: Payer: Self-pay | Admitting: Psychiatry

## 2022-10-04 VITALS — BP 114/71 | HR 73 | Ht 67.5 in | Wt 205.8 lb

## 2022-10-04 DIAGNOSIS — G43409 Hemiplegic migraine, not intractable, without status migrainosus: Secondary | ICD-10-CM

## 2022-10-04 MED ORDER — QULIPTA 30 MG PO TABS: 30.0000 mg | ORAL_TABLET | Freq: Every day | ORAL | 6 refills | Status: DC

## 2022-10-04 MED ORDER — UBRELVY 50 MG PO TABS: 50.0000 mg | ORAL_TABLET | ORAL | 2 refills | Status: DC | PRN

## 2022-10-04 NOTE — Progress Notes (Signed)
   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 Also has visual aura, photophobia, nausea, and vomiting. Rarely gets left-sided weakness.   Interval History: Headaches have improved since her last visit. She is averaging ~8 migraines per month. Has stopped taking Topamax and is currently not taking anything for prevention. She is taking Ubrelvy as needed, which has been helpful but she runs out of it every month.  Migraine days per month: 8 Headache free days per month: 22  Current Headache Regimen: Preventative: none Abortive: Ubrelvy 50 mg PRN   Prior Therapies                                  Topamax 100 mg at bedtime - kidney stones Carvedilol 25 mg BID Flexeril 5 mg at bedtime Ubrelvy 50 mg PRN  Physical Exam:   Vital Signs: BP 114/71 (BP Location: Left Arm, Patient Position: Sitting, Cuff Size: Normal)   Pulse 73   Ht 5' 7.5" (1.715 m)   Wt 205 lb 12.8 oz (93.4 kg)   BMI 31.76 kg/m  GENERAL:  well appearing, in no acute distress, alert  SKIN:  Color, texture, turgor normal. No rashes or lesions HEAD:  Normocephalic/atraumatic. RESP: normal respiratory effort MSK:  No gross joint deformities.   NEUROLOGICAL: Mental Status: Alert, oriented to person, place and time, Follows commands, and Speech fluent and appropriate. Cranial Nerves: PERRL, face symmetric, no dysarthria, hearing grossly intact Motor: moves all extremities equally Gait: normal-based.  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 She continues to have frequent headaches, but is hesitant to start a preventive medication due to concern for cognitive side effects. Does not want  to take injections due to needlephobia. Will start Qulipta for migraine prevention and continue Ubrelvy for rescue. She is not interested in increasing the dose of Ubrelvy at this time.  PLAN: -Prevention: Start Qulipta 30 mg daily -Rescue: Continue Ubrelvy 50 mg PRN -Next steps: consider SNRI, low dose TCA for prevention   Follow-up: 4 months  I spent a total of 20 minutes on the date of the service. Headache education was done. Discussed treatment options including preventive and acute medications. Discussed medication side effects, adverse reactions and drug interactions. Written educational materials and patient instructions outlining all of the above were given.  Melanie Doyne, MD 10/04/22 3:50 PM

## 2022-10-05 ENCOUNTER — Telehealth: Payer: Self-pay

## 2022-10-05 ENCOUNTER — Other Ambulatory Visit (HOSPITAL_COMMUNITY): Payer: Self-pay

## 2022-10-05 NOTE — Telephone Encounter (Signed)
Pharmacy Patient Advocate Encounter   Received notification from CoverMyMeds that prior authorization for Qulipta 30MG  tablets is required/requested.   Insurance verification completed.   The patient is insured through [facility] .   Per test claim: PA submitted to [facility] via CoverMyMeds Key/confirmation #/EOC [id number] Status is pending

## 2022-10-05 NOTE — Telephone Encounter (Signed)
1st attempt lvm

## 2022-10-05 NOTE — Telephone Encounter (Signed)
Pharmacy Patient Advocate Encounter  Received notification from [facility] that Prior Authorization for Qulipta 30MG  tablets has been APPROVED from 10/05/2022 to 03/14/2023. Ran test claim, Copay is $0 per 30DS.  PA #/Case ID/Reference #: PA Case ID: [id number]

## 2022-10-05 NOTE — Telephone Encounter (Signed)
Pt returned called. Requesting a call back from nurse.

## 2022-10-07 ENCOUNTER — Encounter: Payer: Self-pay | Admitting: Urology

## 2022-10-07 ENCOUNTER — Ambulatory Visit: Payer: 59 | Admitting: Urology

## 2022-10-07 VITALS — BP 97/66 | HR 81

## 2022-10-07 DIAGNOSIS — N201 Calculus of ureter: Secondary | ICD-10-CM

## 2022-10-07 NOTE — Patient Instructions (Signed)

## 2022-10-07 NOTE — Progress Notes (Unsigned)
10/07/2022 9:31 AM   Melanie Cordova [birth year] [id number]  Referring provider: Benetta Spar, MD [address] [place],  [place] [address]  Followup nephrolithiasis   HPI: Melanie Cordova is a 61yo here for followup for nephrolithiasis. No stone events last visit. Renal US shows bilateral renal calculi. She denies nay flank pain. She has issues with constipation and started taking miralax intermittently.    PMH: Past Medical History:  Diagnosis Date   Anxiety    Arthritis    Asthma    Automatic implantable cardioverter-defibrillator in situ    2013, july   Bell palsy    states has had 3 episodes   Cardiomyopathy 08/2010   Presented with congestive heart failure; EF of 15% and 2012; hypotension on medication precludes optimal dosing   Chest pain 12/28/2017   CHF (congestive heart failure) (HCC)    a. EF 20% in 2012 with low-risk NST --> St. Jude ICD implantation by Dr. Ladona Ridgel in 09/2011 b. NST in 12/2017 showing evidence of prior infarct without ischemia c. EF at 45-50% by echo in 06/2019   Chronic systolic heart failure (HCC)    COPD (chronic obstructive pulmonary disease) (HCC)    2013   Dysrhythmia    Gastroesophageal reflux disease    Headache(784.0)    Hot flashes 10/04/2016   Hyperlipidemia    Hypertension    09/2010-normal CMet and CBC; Lipid profile-116, 88, 25, 73   Hypothyroidism    Recent TSH was normal.   ICD (implantable cardiac defibrillator) in place 10/10/2011   Neuromuscular disorder (HCC)    Neuropathy, right foot and leg   Neuropathy    Obesity    Pneumonia    Seizures (HCC)    last one at age 80   Shortness of breath    Stroke (HCC)     stroke in 07/2012 and another in June, 2014   Tobacco abuse    20 pack years    Surgical History: Past Surgical History:  Procedure Laterality Date   CESAREAN SECTION     X2   COLONOSCOPY N/A 12/16/2013   One simple adenoma and 3 hyperplastic polyps. Small internal hemorrhoids. Left  colon redundant. Due for surveillance 2020.    COLONOSCOPY WITH PROPOFOL N/A 06/04/2019   Procedure: COLONOSCOPY WITH PROPOFOL;  Surgeon: West Bali, MD;  Location: [facility];  Service: Endoscopy;  Laterality: N/A;  10:30am   COLONOSCOPY WITH PROPOFOL N/A 06/15/2022   Procedure: COLONOSCOPY WITH PROPOFOL;  Surgeon: Corbin Ade, MD;  Location: [facility];  Service: Endoscopy;  Laterality: N/A;  11:45am, asa 3   CYSTOSCOPY W/ URETERAL STENT PLACEMENT Bilateral 03/01/2022   Procedure: CYSTOSCOPY WITH RETROGRADE PYELOGRAM/URETERAL STENT PLACEMENT;  Surgeon: Malen Gauze, MD;  Location: [facility];  Service: Urology;  Laterality: Bilateral;   CYSTOSCOPY WITH RETROGRADE PYELOGRAM, URETEROSCOPY AND STENT PLACEMENT Left 05/05/2022   Procedure: CYSTOSCOPY WITH RETROGRADE PYELOGRAM, URETEROSCOPY AND STENT EXCHANGE;  Surgeon: Malen Gauze, MD;  Location: [facility];  Service: Urology;  Laterality: Left;   CYSTOSCOPY WITH RETROGRADE PYELOGRAM, URETEROSCOPY AND STENT PLACEMENT Right 06/09/2022   Procedure: CYSTOSCOPY WITH RETROGRADE PYELOGRAM, URETEROSCOPY AND STENT EXCHANGE;  Surgeon: Malen Gauze, MD;  Location: [facility];  Service: Urology;  Laterality: Right;   EP IMPLANTABLE DEVICE     St. Jude   EP IMPLANTABLE DEVICE N/A 08/13/2015   Procedure: Loop Recorder Insertion;  Surgeon: Marinus Maw, MD;  Location: [facility];  Service: Cardiovascular;  Laterality:  N/A;   HOLMIUM LASER APPLICATION Left 05/05/2022   Procedure: HOLMIUM LASER APPLICATION;  Surgeon: Malen Gauze, MD;  Location: [facility];  Service: Urology;  Laterality: Left;   HOLMIUM LASER APPLICATION Right 06/09/2022   Procedure: HOLMIUM LASER APPLICATION;  Surgeon: Malen Gauze, MD;  Location: [facility];  Service: Urology;  Laterality: Right;   IMPLANTABLE CARDIOVERTER DEFIBRILLATOR IMPLANT N/A 10/10/2011   Procedure: IMPLANTABLE CARDIOVERTER DEFIBRILLATOR IMPLANT;  Surgeon: Marinus Maw, MD;  Location: [facility]  CATH LAB;  Service: Cardiovascular;  Laterality: N/A;   MULTIPLE EXTRACTIONS WITH ALVEOLOPLASTY N/A 09/24/2012   Procedure: MULTIPLE EXTRACION #2, 4, 6, 7 ,8, 9, 11, 13, 18, 20, 21, 22, 23, 24, 25, 26, 27, 29 WITH ALVEOLOPLASTY, BIOPSY OF PALATE LESION, REMOVA RIGHT LINGUAL TORUS;  Surgeon: Georgia Lopes, DDS;  Location: [facility];  Service: Oral Surgery;  Laterality: N/A;   POLYPECTOMY  06/04/2019   Procedure: POLYPECTOMY;  Surgeon: West Bali, MD;  [facility];  Service: Endoscopy;;   POLYPECTOMY  06/15/2022   Procedure: POLYPECTOMY;  Surgeon: Corbin Ade, MD;  [facility]: [facility];  Service: Endoscopy;;   TEE WITHOUT CARDIOVERSION N/A 07/11/2012   Procedure: TRANSESOPHAGEAL ECHOCARDIOGRAM (TEE);  Surgeon: Vesta Mixer, MD;  Location: [facility] ENDOS[facility];  Service: Cardiovascular;  Laterality: N/A;   TUBAL LIGATION      Home Medications:  Allergies as of 10/07/2022       Reactions   Fish Allergy Anaphylaxis   Iodinated Contrast Media Hives, Other (See Comments)   Pulmonary problems; no frank respiratory arrest   Iodine Hives, Other (See Comments)   Pulmonary Problems    Lentil Anaphylaxis   Penicillins Anaphylaxis, Shortness Of Breath   Has patient had a PCN reaction causing immediate rash, facial/tongue/throat swelling, SOB or lightheadedness with hypotension: Yes Has patient had a PCN reaction causing severe rash involving mucus membranes or skin necrosis: No Has patient had a PCN reaction that required hospitalization No Has patient had a PCN reaction occurring within the last 10 years: No If all of the above answers are "NO", then may proceed with Cephalosporin use. Hair loss   Lipitor [atorvastatin] Itching, Rash   Spironolactone Rash   Sulfa Antibiotics Rash   Irritates throat         Medication List        Accurate as of October 07, 2022  9:31 AM. If you have any questions, ask your nurse or doctor.          acetaminophen 650 MG CR tablet Commonly  known as: TYLENOL Take 1,300 mg by mouth at bedtime.   albuterol 108 (90 Base) MCG/ACT inhaler Commonly known as: VENTOLIN HFA Inhale 2 puffs into the lungs every 6 (six) hours as needed for wheezing or shortness of breath.   alfuzosin 10 MG 24 hr tablet Commonly known as: UROXATRAL TAKE ONE TABLET BY MOUTH DAILY WITH BREAKFAST   aspirin EC 81 MG tablet Take 1 tablet (81 mg total) by mouth daily.   Biotin 5000 MCG Caps Take 25,000 mcg by mouth daily.   budesonide-formoterol 80-4.5 MCG/ACT inhaler Commonly known as: SYMBICORT Inhale 2 puffs into the lungs 2 (two) times daily.   carvedilol 12.5 MG tablet Commonly known as: COREG Take 25 mg by mouth 2 (two) times daily with a meal.   CENTRAVITES 50 PLUS PO Take 1 tablet by mouth daily.   cetirizine 10 MG tablet Commonly known as: ZYRTEC Take 10 mg by mouth daily.   clobetasol  0.05 % external solution Commonly known as: TEMOVATE Apply 1 application  topically once a week. Apply to scalp   clopidogrel 75 MG tablet Commonly known as: PLAVIX Take 1 tablet (75 mg total) by mouth daily.   Entresto 24-26 MG Generic drug: sacubitril-valsartan TAKE ONE TABLET BY MOUTH 2 TIMES A DAY   famotidine 20 MG tablet Commonly known as: PEPCID Take 20 mg by mouth daily as needed for heartburn or indigestion.   ferrous sulfate 325 (65 FE) MG EC tablet Take 325 mg by mouth daily.   fluticasone 50 MCG/ACT nasal spray Commonly known as: FLONASE Place 2 sprays into both nostrils daily.   furosemide 40 MG tablet Commonly known as: LASIX Take 1 tablet (40 mg total) by mouth 2 (two) times daily.   ketoconazole 2 % cream Commonly known as: NIZORAL Apply 1 application  topically 2 (two) times daily as needed (psoriasis).   levothyroxine 175 MCG tablet Commonly known as: SYNTHROID Take 175 mcg by mouth daily before breakfast.   ondansetron 4 MG tablet Commonly known as: Zofran Take 1 tablet (4 mg total) by mouth daily as needed for  nausea or vomiting.   ondansetron 4 MG tablet Commonly known as: ZOFRAN TAKE ONE TABLET BY MOUTH DAILY AS NEEDED FOR NAUSEA AND VOMITING   potassium chloride SA 20 MEQ tablet Commonly known as: KLOR-CON M Take 20 mEq by mouth daily.   Qulipta 30 MG Tabs Generic drug: Atogepant Take 1 tablet (30 mg total) by mouth daily.   rosuvastatin 20 MG tablet Commonly known as: CRESTOR Take 20 mg by mouth daily.   Senna-Time 8.6 MG tablet Generic drug: senna TAKE ONE TABLET BY MOUTH DAILY   Ubrelvy 50 MG Tabs Generic drug: Ubrogepant Take 1 tablet (50 mg total) by mouth as needed. Can repeat a dose in 2 hours if headache persists   Vitamin D (Ergocalciferol) 1.25 MG (50000 UNIT) Caps capsule Commonly known as: DRISDOL Take 50,000 Units by mouth every 30 (thirty) days.        Allergies:  Allergies  Allergen Reactions   Fish Allergy Anaphylaxis   Iodinated Contrast Media Hives and Other (See Comments)    Pulmonary problems; no frank respiratory arrest   Iodine Hives and Other (See Comments)    Pulmonary Problems    Lentil Anaphylaxis   Penicillins Anaphylaxis and Shortness Of Breath    Has patient had a PCN reaction causing immediate rash, facial/tongue/throat swelling, SOB or lightheadedness with hypotension: Yes Has patient had a PCN reaction causing severe rash involving mucus membranes or skin necrosis: No Has patient had a PCN reaction that required hospitalization No Has patient had a PCN reaction occurring within the last 10 years: No If all of the above answers are "NO", then may proceed with Cephalosporin use.  Hair loss   Lipitor [Atorvastatin] Itching and Rash   Spironolactone Rash   Sulfa Antibiotics Rash    Irritates throat     Family History: Family History  Problem Relation Age of Onset   Cardiomyopathy Mother        ICD pacemaker-ischmic CM   Heart failure Mother    Hypertension Mother    Diabetes Mother    Thyroid disease Mother    Cardiomyopathy  Father        Deceased   Coronary artery disease Father    Heart failure Father    Heart failure Brother    Hypertension Brother    Endometriosis Sister    Heart disease Paternal Grandfather  Heart disease Paternal Grandmother    Heart disease Maternal Grandmother    Heart disease Maternal Grandfather    Hypertension Daughter    Obesity Daughter    Colon cancer Neg Hx    Liver disease Neg Hx     Social History:  reports that she has been smoking cigarettes. She started smoking about 39 years ago. She has a 29.4 pack-year smoking history. She has never used smokeless tobacco. She reports that she does not drink alcohol and does not use drugs.  ROS: All other review of systems were reviewed and are negative except what is noted above in HPI  Physical Exam: BP 97/66   Pulse 81   Constitutional:  Alert and oriented, No acute distress. HEENT: [name] AT, moist mucus membranes.  Trachea midline, no masses. Cardiovascular: No clubbing, cyanosis, or edema. Respiratory: Normal respiratory effort, no increased work of breathing. GI: Abdomen is soft, nontender, nondistended, no abdominal masses GU: No CVA tenderness.  Lymph: No cervical or inguinal lymphadenopathy. Skin: No rashes, bruises or suspicious lesions. Neurologic: Grossly intact, no focal deficits, moving all 4 extremities. Psychiatric: Normal mood and affect.  Laboratory Data: Lab Results  Component Value Date   WBC 10.7 (H) 06/06/2022   HGB 12.6 06/06/2022   HCT 40.4 06/06/2022   MCV 94.6 06/06/2022   PLT 242 06/06/2022    Lab Results  Component Value Date   CREATININE 0.84 06/06/2022    No results found for: "PSA"  No results found for: "TESTOSTERONE"  Lab Results  Component Value Date   HGBA1C 5.7 (H) 07/03/2019    Urinalysis    Component Value Date/Time   COLORURINE AMBER (A) 02/28/2022 1615   APPEARANCEUR Cloudy (A) 06/24/2022 1251   LABSPEC 1.013 02/28/2022 1615   PHURINE 7.0 02/28/2022 1615    GLUCOSEU Negative 06/24/2022 1251   HGBUR MODERATE (A) 02/28/2022 1615   BILIRUBINUR Negative 06/24/2022 1251   KETONESUR NEGATIVE 02/28/2022 1615   PROTEINUR 2+ (A) 06/24/2022 1251   PROTEINUR >=300 (A) 02/28/2022 1615   UROBILINOGEN 0.2 04/29/2013 1702   NITRITE Negative 06/24/2022 1251   NITRITE POSITIVE (A) 02/28/2022 1615   LEUKOCYTESUR 3+ (A) 06/24/2022 1251   LEUKOCYTESUR LARGE (A) 02/28/2022 1615    Lab Results  Component Value Date   LABMICR See below: 06/24/2022   WBCUA >30 (A) 06/24/2022   LABEPIT 0-10 06/24/2022   BACTERIA Moderate (A) 06/24/2022    Pertinent Imaging: Renal 09/14/2022: Images reviewed and discussed with the patient  No results found for this or any previous visit.  No results found for this or any previous visit.  No results found for this or any previous visit.  No results found for this or any previous visit.  Results for orders placed in visit on 08/11/22  US RENAL  Narrative CLINICAL DATA:  Bilateral ureteral calculi follow-up  EXAM: RENAL / URINARY TRACT ULTRASOUND COMPLETE  COMPARISON:  CT abdomen pelvis 02/28/2022  FINDINGS: Right Kidney:  Renal measurements: 11.6 cm = volume: 227 mL. Echogenicity within normal limits. No hydronephrosis. 1.3 cm simple cyst in the lower pole does not require dedicated imaging follow-up. 9 mm nonobstructing calculus seen in the lower pole.  Left Kidney:  Renal measurements: 11.9 cm = volume: 204 mL. Echogenicity within normal limits. No hydronephrosis. Multiple nonobstructing left renal calculi measuring up to 6 mm.  Bladder:  Appears normal for degree of bladder distention.  Other:  None.  IMPRESSION: 1. No hydronephrosis. 2. Bilateral nonobstructing renal calculi.   Electronically Signed  By: Mauri Reading  Mir M.D. On: 09/19/2022 14:47  No valid procedures specified. No results found for this or any previous visit.  No results found for this or any previous visit.   Assessment  & Plan:    1. Bilateral renal calculi -followup 6 months with renal US   No follow-ups on file.  Wilkie Aye, MD  [facility] [place]

## 2022-10-16 ENCOUNTER — Other Ambulatory Visit: Payer: Self-pay | Admitting: Urology

## 2022-11-17 ENCOUNTER — Other Ambulatory Visit: Payer: Self-pay | Admitting: Urology

## 2022-12-16 ENCOUNTER — Other Ambulatory Visit (HOSPITAL_COMMUNITY): Payer: Self-pay | Admitting: Internal Medicine

## 2022-12-16 DIAGNOSIS — Z1231 Encounter for screening mammogram for malignant neoplasm of breast: Secondary | ICD-10-CM

## 2022-12-17 ENCOUNTER — Other Ambulatory Visit: Payer: Self-pay | Admitting: Urology

## 2023-01-09 ENCOUNTER — Encounter: Payer: Self-pay | Admitting: Gastroenterology

## 2023-01-14 ENCOUNTER — Other Ambulatory Visit: Payer: Self-pay | Admitting: Urology

## 2023-01-16 ENCOUNTER — Ambulatory Visit (HOSPITAL_COMMUNITY): Payer: 59

## 2023-01-25 ENCOUNTER — Ambulatory Visit (HOSPITAL_COMMUNITY)
Admission: RE | Admit: 2023-01-25 | Discharge: 2023-01-25 | Disposition: A | Discharge status: Home / Self Care | Payer: 59 | Source: Ambulatory Visit | Attending: Internal Medicine | Admitting: Internal Medicine

## 2023-01-25 ENCOUNTER — Encounter (HOSPITAL_COMMUNITY): Payer: Self-pay

## 2023-01-25 DIAGNOSIS — Z1231 Encounter for screening mammogram for malignant neoplasm of breast: Secondary | ICD-10-CM | POA: Diagnosis present

## 2023-02-11 ENCOUNTER — Other Ambulatory Visit: Payer: Self-pay | Admitting: Urology

## 2023-02-16 ENCOUNTER — Ambulatory Visit: Payer: 59 | Admitting: Neurology

## 2023-02-16 ENCOUNTER — Other Ambulatory Visit: Payer: Self-pay | Admitting: *Deleted

## 2023-02-16 NOTE — Telephone Encounter (Signed)
Last seen on 10/04/22 No 4 month follow up scheduled

## 2023-02-21 MED ORDER — UBRELVY 50 MG PO TABS: 50.0000 mg | ORAL_TABLET | ORAL | 2 refills | Status: DC | PRN

## 2023-02-21 NOTE — Addendum Note (Signed)
Addended by: Aura Camps on: 02/21/2023 09:31 AM   Modules accepted: Orders

## 2023-02-21 NOTE — Telephone Encounter (Signed)
Pt now scheduled on 09/19/23 with Maralyn Sago

## 2023-03-15 DIAGNOSIS — J449 Chronic obstructive pulmonary disease, unspecified: Secondary | ICD-10-CM | POA: Diagnosis not present

## 2023-03-15 DIAGNOSIS — I10 Essential (primary) hypertension: Secondary | ICD-10-CM | POA: Diagnosis not present

## 2023-03-16 ENCOUNTER — Other Ambulatory Visit: Payer: Self-pay | Admitting: Urology

## 2023-03-30 DIAGNOSIS — I10 Essential (primary) hypertension: Secondary | ICD-10-CM | POA: Diagnosis not present

## 2023-03-30 DIAGNOSIS — I5022 Chronic systolic (congestive) heart failure: Secondary | ICD-10-CM | POA: Diagnosis not present

## 2023-04-10 ENCOUNTER — Ambulatory Visit (HOSPITAL_COMMUNITY)
Admission: RE | Admit: 2023-04-10 | Discharge: 2023-04-10 | Disposition: A | Discharge status: Home / Self Care | Payer: 59 | Source: Ambulatory Visit | Attending: Urology | Admitting: Urology

## 2023-04-10 DIAGNOSIS — N201 Calculus of ureter: Secondary | ICD-10-CM | POA: Insufficient documentation

## 2023-04-10 DIAGNOSIS — N200 Calculus of kidney: Secondary | ICD-10-CM | POA: Diagnosis not present

## 2023-04-14 ENCOUNTER — Ambulatory Visit (INDEPENDENT_AMBULATORY_CARE_PROVIDER_SITE_OTHER): Payer: 59 | Admitting: Urology

## 2023-04-14 VITALS — BP 107/70 | HR 80

## 2023-04-14 DIAGNOSIS — N201 Calculus of ureter: Secondary | ICD-10-CM | POA: Diagnosis not present

## 2023-04-14 LAB — MICROSCOPIC EXAMINATION: Bacteria, UA: NONE SEEN

## 2023-04-14 LAB — URINALYSIS, ROUTINE W REFLEX MICROSCOPIC
Bilirubin, UA: NEGATIVE
Glucose, UA: NEGATIVE
Ketones, UA: NEGATIVE
Nitrite, UA: NEGATIVE
Protein,UA: NEGATIVE
RBC, UA: NEGATIVE
Specific Gravity, UA: 1.015 (ref 1.005–1.030)
Urobilinogen, Ur: 0.2 mg/dL (ref 0.2–1.0)
pH, UA: 5.5 (ref 5.0–7.5)

## 2023-04-14 NOTE — Progress Notes (Unsigned)
04/14/2023 9:43 AM   Melanie Cordova [birth year] [id number]  Referring provider: Benetta Spar, MD [address] [place],  [place] [address]  No chief complaint on file.   HPI: Melanie Cordova is a 63yo here for followuop for nephrolithiasis. No stone events since last visit. She denies any flank pain. She has stable urinary frequency. No urgency or urge incontinence. No UTI   PMH: Past Medical History:  Diagnosis Date   Anxiety    Arthritis    Asthma    Automatic implantable cardioverter-defibrillator in situ    2013, july   Bell palsy    states has had 3 episodes   Cardiomyopathy 08/2010   Presented with congestive heart failure; EF of 15% and 2012; hypotension on medication precludes optimal dosing   Chest pain 12/28/2017   CHF (congestive heart failure) (HCC)    a. EF 20% in 2012 with low-risk NST --> St. Jude ICD implantation by Dr. Ladona Ridgel in 09/2011 b. NST in 12/2017 showing evidence of prior infarct without ischemia c. EF at 45-50% by echo in 06/2019   Chronic systolic heart failure (HCC)    COPD (chronic obstructive pulmonary disease) (HCC)    2013   Dysrhythmia    Gastroesophageal reflux disease    Headache(784.0)    Hot flashes 10/04/2016   Hyperlipidemia    Hypertension    09/2010-normal CMet and CBC; Lipid profile-116, 88, 25, 73   Hypothyroidism    Recent TSH was normal.   ICD (implantable cardiac defibrillator) in place 10/10/2011   Neuromuscular disorder (HCC)    Neuropathy, right foot and leg   Neuropathy    Obesity    Pneumonia    Seizures (HCC)    last one at age 51   Shortness of breath    Stroke (HCC)     stroke in 07/2012 and another in June, 2014   Tobacco abuse    20 pack years    Surgical History: Past Surgical History:  Procedure Laterality Date   CESAREAN SECTION     X2   COLONOSCOPY N/A 12/16/2013   One simple adenoma and 3 hyperplastic polyps. Small internal hemorrhoids. Left colon redundant. Due for surveillance  2020.    COLONOSCOPY WITH PROPOFOL N/A 06/04/2019   Procedure: COLONOSCOPY WITH PROPOFOL;  Surgeon: West Bali, MD;  Location: [facility];  Service: Endoscopy;  Laterality: N/A;  10:30am   COLONOSCOPY WITH PROPOFOL N/A 06/15/2022   Procedure: COLONOSCOPY WITH PROPOFOL;  Surgeon: Corbin Ade, MD;  Location: [facility];  Service: Endoscopy;  Laterality: N/A;  11:45am, asa 3   CYSTOSCOPY W/ URETERAL STENT PLACEMENT Bilateral 03/01/2022   Procedure: CYSTOSCOPY WITH RETROGRADE PYELOGRAM/URETERAL STENT PLACEMENT;  Surgeon: Malen Gauze, MD;  Location: [facility];  Service: Urology;  Laterality: Bilateral;   CYSTOSCOPY WITH RETROGRADE PYELOGRAM, URETEROSCOPY AND STENT PLACEMENT Left 05/05/2022   Procedure: CYSTOSCOPY WITH RETROGRADE PYELOGRAM, URETEROSCOPY AND STENT EXCHANGE;  Surgeon: Malen Gauze, MD;  Location: [facility];  Service: Urology;  Laterality: Left;   CYSTOSCOPY WITH RETROGRADE PYELOGRAM, URETEROSCOPY AND STENT PLACEMENT Right 06/09/2022   Procedure: CYSTOSCOPY WITH RETROGRADE PYELOGRAM, URETEROSCOPY AND STENT EXCHANGE;  Surgeon: Malen Gauze, MD;  Location: [facility];  Service: Urology;  Laterality: Right;   EP IMPLANTABLE DEVICE     St. Jude   EP IMPLANTABLE DEVICE N/A 08/13/2015   Procedure: Loop Recorder Insertion;  Surgeon: Marinus Maw, MD;  Location: [facility];  Service: Cardiovascular;  Laterality: N/A;  HOLMIUM LASER APPLICATION Left 05/05/2022   Procedure: HOLMIUM LASER APPLICATION;  Surgeon: Malen Gauze, MD;  Location: [facility];  Service: Urology;  Laterality: Left;   HOLMIUM LASER APPLICATION Right 06/09/2022   Procedure: HOLMIUM LASER APPLICATION;  Surgeon: Malen Gauze, MD;  Location: [facility];  Service: Urology;  Laterality: Right;   IMPLANTABLE CARDIOVERTER DEFIBRILLATOR IMPLANT N/A 10/10/2011   Procedure: IMPLANTABLE CARDIOVERTER DEFIBRILLATOR IMPLANT;  Surgeon: Marinus Maw, MD;  Location: [facility] CATH LAB;  Service: Cardiovascular;   Laterality: N/A;   MULTIPLE EXTRACTIONS WITH ALVEOLOPLASTY N/A 09/24/2012   Procedure: MULTIPLE EXTRACION #2, 4, 6, 7 ,8, 9, 11, 13, 18, 20, 21, 22, 23, 24, 25, 26, 27, 29 WITH ALVEOLOPLASTY, BIOPSY OF PALATE LESION, REMOVA RIGHT LINGUAL TORUS;  Surgeon: Georgia Lopes, DDS;  Location: [facility];  Service: Oral Surgery;  Laterality: N/A;   POLYPECTOMY  06/04/2019   Procedure: POLYPECTOMY;  Surgeon: West Bali, MD;  [facility];  Service: Endoscopy;;   POLYPECTOMY  06/15/2022   Procedure: POLYPECTOMY;  Surgeon: Corbin Ade, MD;  [facility];  Service: Endoscopy;;   TEE WITHOUT CARDIOVERSION N/A 07/11/2012   Procedure: TRANSESOPHAGEAL ECHOCARDIOGRAM (TEE);  Surgeon: Vesta Mixer, MD;  Location: [facility] ENDOSCOPY;  Service: Cardiovascular;  Laterality: N/A;   TUBAL LIGATION      Home Medications:  Allergies as of 04/14/2023       Reactions   Fish Allergy Anaphylaxis   Iodinated Contrast Media Hives, Other (See Comments)   Pulmonary problems; no frank respiratory arrest   Iodine Hives, Other (See Comments)   Pulmonary Problems    Lentil Anaphylaxis   Penicillins Anaphylaxis, Shortness Of Breath   Has patient had a PCN reaction causing immediate rash, facial/tongue/throat swelling, SOB or lightheadedness with hypotension: Yes Has patient had a PCN reaction causing severe rash involving mucus membranes or skin necrosis: No Has patient had a PCN reaction that required hospitalization No Has patient had a PCN reaction occurring within the last 10 years: No If all of the above answers are "NO", then may proceed with Cephalosporin use. Hair loss   Lipitor [atorvastatin] Itching, Rash   Spironolactone Rash   Sulfa Antibiotics Rash   Irritates throat         Medication List        Accurate as of April 14, 2023  9:43 AM. If you have any questions, ask your nurse or doctor.          acetaminophen 650 MG CR tablet Commonly known as: TYLENOL Take 1,300 mg by  mouth at bedtime.   albuterol 108 (90 Base) MCG/ACT inhaler Commonly known as: VENTOLIN HFA Inhale 2 puffs into the lungs every 6 (six) hours as needed for wheezing or shortness of breath.   alfuzosin 10 MG 24 hr tablet Commonly known as: UROXATRAL TAKE ONE TABLET BY MOUTH DAILY WITH BREAKFAST   aspirin EC 81 MG tablet Take 1 tablet (81 mg total) by mouth daily.   Biotin 5000 MCG Caps Take 25,000 mcg by mouth daily.   budesonide-formoterol 80-4.5 MCG/ACT inhaler Commonly known as: SYMBICORT Inhale 2 puffs into the lungs 2 (two) times daily.   carvedilol 12.5 MG tablet Commonly known as: COREG Take 25 mg by mouth 2 (two) times daily with a meal.   CENTRAVITES 50 PLUS PO Take 1 tablet by mouth daily.   cetirizine 10 MG tablet Commonly known as: ZYRTEC Take 10 mg by mouth daily.   clobetasol 0.05 % external  solution Commonly known as: TEMOVATE Apply 1 application  topically once a week. Apply to scalp   clopidogrel 75 MG tablet Commonly known as: PLAVIX Take 1 tablet (75 mg total) by mouth daily.   Entresto 24-26 MG Generic drug: sacubitril-valsartan TAKE ONE TABLET BY MOUTH 2 TIMES A DAY   famotidine 20 MG tablet Commonly known as: PEPCID Take 20 mg by mouth daily as needed for heartburn or indigestion.   ferrous sulfate 325 (65 FE) MG EC tablet Take 325 mg by mouth daily.   fluticasone 50 MCG/ACT nasal spray Commonly known as: FLONASE Place 2 sprays into both nostrils daily.   furosemide 40 MG tablet Commonly known as: LASIX Take 1 tablet (40 mg total) by mouth 2 (two) times daily.   ketoconazole 2 % cream Commonly known as: NIZORAL Apply 1 application  topically 2 (two) times daily as needed (psoriasis).   levothyroxine 175 MCG tablet Commonly known as: SYNTHROID Take 175 mcg by mouth daily before breakfast.   ondansetron 4 MG tablet Commonly known as: Zofran Take 1 tablet (4 mg total) by mouth daily as needed for nausea or vomiting.   ondansetron  4 MG tablet Commonly known as: ZOFRAN TAKE ONE TABLET BY MOUTH DAILY AS NEEDED FOR NAUSEA AND VOMITING   potassium chloride SA 20 MEQ tablet Commonly known as: KLOR-CON M Take 20 mEq by mouth daily.   Qulipta 30 MG Tabs Generic drug: Atogepant Take 1 tablet (30 mg total) by mouth daily.   rosuvastatin 20 MG tablet Commonly known as: CRESTOR Take 20 mg by mouth daily.   Senna-Time 8.6 MG tablet Generic drug: senna TAKE ONE TABLET BY MOUTH DAILY   Ubrelvy 50 MG Tabs Generic drug: Ubrogepant Take 1 tablet (50 mg total) by mouth as needed. Can repeat a dose in 2 hours if headache persists   Vitamin D (Ergocalciferol) 1.25 MG (50000 UNIT) Caps capsule Commonly known as: DRISDOL Take 50,000 Units by mouth every 30 (thirty) days.        Allergies:  Allergies  Allergen Reactions   Fish Allergy Anaphylaxis   Iodinated Contrast Media Hives and Other (See Comments)    Pulmonary problems; no frank respiratory arrest   Iodine Hives and Other (See Comments)    Pulmonary Problems    Lentil Anaphylaxis   Penicillins Anaphylaxis and Shortness Of Breath    Has patient had a PCN reaction causing immediate rash, facial/tongue/throat swelling, SOB or lightheadedness with hypotension: Yes Has patient had a PCN reaction causing severe rash involving mucus membranes or skin necrosis: No Has patient had a PCN reaction that required hospitalization No Has patient had a PCN reaction occurring within the last 10 years: No If all of the above answers are "NO", then may proceed with Cephalosporin use.  Hair loss   Lipitor [Atorvastatin] Itching and Rash   Spironolactone Rash   Sulfa Antibiotics Rash    Irritates throat     Family History: Family History  Problem Relation Age of Onset   Cardiomyopathy Mother        ICD pacemaker-ischmic CM   Heart failure Mother    Hypertension Mother    Diabetes Mother    Thyroid disease Mother    Cardiomyopathy Father        Deceased   Coronary  artery disease Father    Heart failure Father    Heart failure Brother    Hypertension Brother    Endometriosis Sister    Heart disease Paternal Grandfather  Heart disease Paternal Grandmother    Heart disease Maternal Grandmother    Heart disease Maternal Grandfather    Hypertension Daughter    Obesity Daughter    Colon cancer Neg Hx    Liver disease Neg Hx     Social History:  reports that she has been smoking cigarettes. She started smoking about 39 years ago. She has a 29.8 pack-year smoking history. She has never used smokeless tobacco. She reports that she does not drink alcohol and does not use drugs.  ROS: All other review of systems were reviewed and are negative except what is noted above in HPI  Physical Exam: BP 107/70   Pulse 80   Constitutional:  Alert and oriented, No acute distress. HEENT: [place] AT, moist mucus membranes.  Trachea midline, no masses. Cardiovascular: No clubbing, cyanosis, or edema. Respiratory: Normal respiratory effort, no increased work of breathing. GI: Abdomen is soft, nontender, nondistended, no abdominal masses GU: No CVA tenderness.  Lymph: No cervical or inguinal lymphadenopathy. Skin: No rashes, bruises or suspicious lesions. Neurologic: Grossly intact, no focal deficits, moving all 4 extremities. Psychiatric: Normal mood and affect.  Laboratory Data: Lab Results  Component Value Date   WBC 10.7 (H) 06/06/2022   HGB 12.6 06/06/2022   HCT 40.4 06/06/2022   MCV 94.6 06/06/2022   PLT 242 06/06/2022    Lab Results  Component Value Date   CREATININE 0.84 06/06/2022    No results found for: "PSA"  No results found for: "TESTOSTERONE"  Lab Results  Component Value Date   HGBA1C 5.7 (H) 07/03/2019    Urinalysis    Component Value Date/Time   COLORURINE AMBER (A) 02/28/2022 1615   APPEARANCEUR Cloudy (A) 06/24/2022 1251   LABSPEC 1.013 02/28/2022 1615   PHURINE 7.0 02/28/2022 1615   GLUCOSEU Negative 06/24/2022 1251    HGBUR MODERATE (A) 02/28/2022 1615   BILIRUBINUR Negative 06/24/2022 1251   KETONESUR NEGATIVE 02/28/2022 1615   PROTEINUR 2+ (A) 06/24/2022 1251   PROTEINUR >=300 (A) 02/28/2022 1615   UROBILINOGEN 0.2 04/29/2013 1702   NITRITE Negative 06/24/2022 1251   NITRITE POSITIVE (A) 02/28/2022 1615   LEUKOCYTESUR 3+ (A) 06/24/2022 1251   LEUKOCYTESUR LARGE (A) 02/28/2022 1615    Lab Results  Component Value Date   LABMICR See below: 06/24/2022   WBCUA >30 (A) 06/24/2022   LABEPIT 0-10 06/24/2022   BACTERIA Moderate (A) 06/24/2022    Pertinent Imaging: *** No results found for this or any previous visit.  No results found for this or any previous visit.  No results found for this or any previous visit.  No results found for this or any previous visit.  Results for orders placed during the hospital encounter of 04/10/23  Ultrasound renal complete  Narrative CLINICAL DATA:  Nephrolithiasis  EXAM: RENAL / URINARY TRACT ULTRASOUND COMPLETE  COMPARISON:  Renal ultrasound 09/19/2022  FINDINGS: Right Kidney:  Renal measurements: 12.1 x 5.4 x 6.3 cm = volume: 216 mL. Normal renal cortical thickness and echogenicity. No mass. 8 mm stone.  Left Kidney:  Renal measurements: 12.1 x 5.1 x 5.2 cm = volume: 166 mL. Normal renal cortical thickness and echogenicity. No mass. 2 mm stone.  Bladder:  Appears normal for degree of bladder distention.  Other:  None.  IMPRESSION: Bilateral nephrolithiasis. No hydronephrosis.   Electronically Signed By: Annia Belt M.D. On: 04/10/2023 15:36  No results found for this or any previous visit.  No results found for this or any previous visit.  No results found for this or any previous visit.   Assessment & Plan:    1. Bilateral ureteral calculi (Primary) *** - Urinalysis, Routine w reflex microscopic   No follow-ups on file.  Wilkie Aye, MD  [facility] [place]

## 2023-04-15 DIAGNOSIS — I10 Essential (primary) hypertension: Secondary | ICD-10-CM | POA: Diagnosis not present

## 2023-04-15 DIAGNOSIS — J449 Chronic obstructive pulmonary disease, unspecified: Secondary | ICD-10-CM | POA: Diagnosis not present

## 2023-04-16 ENCOUNTER — Other Ambulatory Visit: Payer: Self-pay | Admitting: Urology

## 2023-04-18 ENCOUNTER — Encounter: Payer: Self-pay | Admitting: Urology

## 2023-04-18 NOTE — Patient Instructions (Signed)

## 2023-04-25 ENCOUNTER — Other Ambulatory Visit: Payer: Self-pay

## 2023-04-25 MED ORDER — QULIPTA 30 MG PO TABS: 30.0000 mg | ORAL_TABLET | Freq: Every day | ORAL | 6 refills | Status: DC

## 2023-04-30 DIAGNOSIS — I10 Essential (primary) hypertension: Secondary | ICD-10-CM | POA: Diagnosis not present

## 2023-04-30 DIAGNOSIS — I5022 Chronic systolic (congestive) heart failure: Secondary | ICD-10-CM | POA: Diagnosis not present

## 2023-05-05 ENCOUNTER — Telehealth: Payer: Self-pay

## 2023-05-05 ENCOUNTER — Other Ambulatory Visit (HOSPITAL_COMMUNITY): Payer: Self-pay

## 2023-05-05 NOTE — Telephone Encounter (Signed)
Pharmacy Patient Advocate Encounter   Received notification from CoverMyMeds that prior authorization for Qulipta 30MG  tablets is required/requested.   Insurance verification completed.   The patient is insured through [employer] .   Per test claim: The current 30 day co-pay is, $0.  No PA needed at this time. This test claim was processed through [facility]- copay amounts may vary at other pharmacies due to pharmacy/plan contracts, or as the patient moves through the different stages of their insurance plan.    Pa on file good thru 03/13/2024

## 2023-05-13 DIAGNOSIS — J449 Chronic obstructive pulmonary disease, unspecified: Secondary | ICD-10-CM | POA: Diagnosis not present

## 2023-05-13 DIAGNOSIS — I10 Essential (primary) hypertension: Secondary | ICD-10-CM | POA: Diagnosis not present

## 2023-05-26 ENCOUNTER — Other Ambulatory Visit: Payer: Self-pay | Admitting: Urology

## 2023-05-28 DIAGNOSIS — I10 Essential (primary) hypertension: Secondary | ICD-10-CM | POA: Diagnosis not present

## 2023-05-28 DIAGNOSIS — I5022 Chronic systolic (congestive) heart failure: Secondary | ICD-10-CM | POA: Diagnosis not present

## 2023-06-13 DIAGNOSIS — J449 Chronic obstructive pulmonary disease, unspecified: Secondary | ICD-10-CM | POA: Diagnosis not present

## 2023-06-13 DIAGNOSIS — I10 Essential (primary) hypertension: Secondary | ICD-10-CM | POA: Diagnosis not present

## 2023-06-21 ENCOUNTER — Other Ambulatory Visit: Payer: Self-pay | Admitting: Urology

## 2023-06-21 ENCOUNTER — Other Ambulatory Visit: Payer: Self-pay | Admitting: Neurology

## 2023-06-22 NOTE — Telephone Encounter (Signed)
 Last seen on 10/04/22 Follow up scheduled on 09/19/23  Last filled on 06/05/23 30 day supply

## 2023-06-30 ENCOUNTER — Other Ambulatory Visit: Payer: Self-pay | Admitting: Neurology

## 2023-07-03 NOTE — Telephone Encounter (Signed)
 Last seen on 10/04/22 Follow up scheduled on 09/19/23

## 2023-07-05 DIAGNOSIS — Z1389 Encounter for screening for other disorder: Secondary | ICD-10-CM | POA: Diagnosis not present

## 2023-07-05 DIAGNOSIS — Z0001 Encounter for general adult medical examination with abnormal findings: Secondary | ICD-10-CM | POA: Diagnosis not present

## 2023-07-05 DIAGNOSIS — F17200 Nicotine dependence, unspecified, uncomplicated: Secondary | ICD-10-CM | POA: Diagnosis not present

## 2023-07-05 DIAGNOSIS — E781 Pure hyperglyceridemia: Secondary | ICD-10-CM | POA: Diagnosis not present

## 2023-07-05 DIAGNOSIS — E039 Hypothyroidism, unspecified: Secondary | ICD-10-CM | POA: Diagnosis not present

## 2023-07-05 DIAGNOSIS — Z95810 Presence of automatic (implantable) cardiac defibrillator: Secondary | ICD-10-CM | POA: Diagnosis not present

## 2023-07-05 DIAGNOSIS — I10 Essential (primary) hypertension: Secondary | ICD-10-CM | POA: Diagnosis not present

## 2023-07-05 DIAGNOSIS — I700 Atherosclerosis of aorta: Secondary | ICD-10-CM | POA: Diagnosis not present

## 2023-07-05 DIAGNOSIS — M1990 Unspecified osteoarthritis, unspecified site: Secondary | ICD-10-CM | POA: Diagnosis not present

## 2023-07-05 DIAGNOSIS — J439 Emphysema, unspecified: Secondary | ICD-10-CM | POA: Diagnosis not present

## 2023-07-13 DIAGNOSIS — I10 Essential (primary) hypertension: Secondary | ICD-10-CM | POA: Diagnosis not present

## 2023-07-13 DIAGNOSIS — J449 Chronic obstructive pulmonary disease, unspecified: Secondary | ICD-10-CM | POA: Diagnosis not present

## 2023-07-14 ENCOUNTER — Other Ambulatory Visit: Payer: Self-pay | Admitting: Urology

## 2023-08-04 DIAGNOSIS — I10 Essential (primary) hypertension: Secondary | ICD-10-CM | POA: Diagnosis not present

## 2023-08-04 DIAGNOSIS — I5022 Chronic systolic (congestive) heart failure: Secondary | ICD-10-CM | POA: Diagnosis not present

## 2023-08-13 ENCOUNTER — Other Ambulatory Visit: Payer: Self-pay | Admitting: Urology

## 2023-08-13 DIAGNOSIS — J449 Chronic obstructive pulmonary disease, unspecified: Secondary | ICD-10-CM | POA: Diagnosis not present

## 2023-08-13 DIAGNOSIS — I10 Essential (primary) hypertension: Secondary | ICD-10-CM | POA: Diagnosis not present

## 2023-08-17 ENCOUNTER — Other Ambulatory Visit (HOSPITAL_COMMUNITY): Payer: Self-pay | Admitting: Gerontology

## 2023-08-17 DIAGNOSIS — Z87891 Personal history of nicotine dependence: Secondary | ICD-10-CM

## 2023-09-04 DIAGNOSIS — I5022 Chronic systolic (congestive) heart failure: Secondary | ICD-10-CM | POA: Diagnosis not present

## 2023-09-04 DIAGNOSIS — I10 Essential (primary) hypertension: Secondary | ICD-10-CM | POA: Diagnosis not present

## 2023-09-12 DIAGNOSIS — I10 Essential (primary) hypertension: Secondary | ICD-10-CM | POA: Diagnosis not present

## 2023-09-12 DIAGNOSIS — J449 Chronic obstructive pulmonary disease, unspecified: Secondary | ICD-10-CM | POA: Diagnosis not present

## 2023-09-14 ENCOUNTER — Other Ambulatory Visit: Payer: Self-pay | Admitting: Urology

## 2023-09-18 ENCOUNTER — Telehealth: Payer: Self-pay | Admitting: Psychiatry

## 2023-09-18 NOTE — Telephone Encounter (Signed)
 Appointment details confirmed

## 2023-09-19 ENCOUNTER — Encounter: Payer: Self-pay | Admitting: Neurology

## 2023-09-19 ENCOUNTER — Telehealth: Payer: Self-pay | Admitting: Neurology

## 2023-09-19 ENCOUNTER — Ambulatory Visit (INDEPENDENT_AMBULATORY_CARE_PROVIDER_SITE_OTHER): Payer: 59 | Admitting: Neurology

## 2023-09-19 VITALS — BP 119/67 | HR 65 | Ht 67.5 in

## 2023-09-19 DIAGNOSIS — G43809 Other migraine, not intractable, without status migrainosus: Secondary | ICD-10-CM | POA: Diagnosis not present

## 2023-09-19 MED ORDER — QULIPTA 60 MG PO TABS
60.0000 mg | ORAL_TABLET | Freq: Every day | ORAL | 11 refills | Status: AC
Start: 2023-09-19 — End: ?

## 2023-09-19 MED ORDER — UBRELVY 100 MG PO TABS: 100.0000 mg | ORAL_TABLET | ORAL | 11 refills | Status: AC | PRN

## 2023-09-19 NOTE — Patient Instructions (Signed)
 Increase Qulipta  60 mg daily for migraine prevention.  Increase Ubrelvy  100 mg as needed for acute migraine, may repeat 2 hours later if needed.  Max is 200 mg in 24 hours.  Please reach out if any concerns.  Follow-up in 6 months.  Thanks!!

## 2023-09-19 NOTE — Telephone Encounter (Signed)
 Can you please request lab results from PCP? Thanks

## 2023-09-19 NOTE — Progress Notes (Signed)
 Patient: Melanie  JONELLE Cordova Date of Birth: 1960/06/18  Reason for Visit: Follow up History from: Patient Primary Neurologist: [facility]   ASSESSMENT AND PLAN 63 y.o. 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  Currently taking Qulipta  30 mg daily, Ubrelvy  50 mg daily.  - Increase Qulipta  60 mg daily for migraine prevention - Increase Ubrelvy  100 mg as needed for acute migraine, may repeat 2 hours later if needed, max 200 mg a day - Reports blood work including kidney function has been normal, I will request labs from primary care, creatinine in March 2024 0.84 - Next steps: Ajovy, Emgality, Nurtec  - Call for worsening symptoms, follow-up in 6 months  CC:  headaches   Follow-up Visit  HPI: Update 09/19/23 SS: Reports she is taking Qulipta  30 mg daily, doesn't seem to be working, but Ubrelvy  works well, but often takes 2. Has about 3 migraines a month, 2 headache days a month. Has not been sleeping well, thinks heat is a trigger. Migraines described an pressure to occipital area, throbbing to top of head. Last night had the weakness on the left arm, has been about a year since this happened. With migraines, aura, spotlight, once a month. She gets medical transportation, doesn't drive.   10/04/22 Dr. 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 Also has visual aura, photophobia, nausea, and vomiting. Rarely gets left-sided weakness.   Headaches have improved since her last visit. She is averaging ~8 migraines per month. Has stopped taking Topamax  and is currently not taking anything for prevention. She is taking Ubrelvy  as needed, which has been helpful but she runs out of it every month.   Migraine days per month: 8 Headache free days  per month: 22   Current Headache Regimen: Preventative: none Abortive: Ubrelvy  50 mg PRN     Prior Therapies                                  Topamax  100 mg at bedtime - kidney stones Carvedilol  25 mg BID Flexeril  5 mg at bedtime Ubrelvy  50 mg PRN  REVIEW OF SYSTEMS: Out of a complete 14 system review of symptoms, the patient complains only of the following symptoms, and all other reviewed systems are negative.  See HPI  ALLERGIES: Allergies  Allergen Reactions   Fish Allergy Anaphylaxis   Iodinated Contrast Media Hives and Other (See Comments)    Pulmonary problems; no frank respiratory arrest   Iodine Hives and Other (See Comments)    Pulmonary Problems    Lentil Anaphylaxis   Penicillins Anaphylaxis and Shortness Of Breath    Has patient had a PCN reaction causing immediate rash, facial/tongue/throat swelling, SOB or lightheadedness with hypotension: Yes Has patient had a PCN reaction causing severe rash involving mucus membranes or skin necrosis: No Has patient had a PCN reaction that required hospitalization No Has patient had a PCN reaction occurring within the last 10 years: No If all of the above answers are NO, then may proceed with Cephalosporin use.  Hair loss   Lipitor [Atorvastatin ] Itching and Rash   Spironolactone  Rash   Sulfa Antibiotics Rash    Irritates throat     HOME MEDICATIONS: Outpatient Medications Prior to Visit  Medication Sig Dispense Refill   acetaminophen  (TYLENOL ) 650 MG CR tablet Take 1,300 mg by mouth at bedtime.     albuterol  (PROVENTIL  HFA;VENTOLIN  HFA) 108 (90 BASE) MCG/ACT inhaler Inhale 2 puffs into the lungs every 6 (six) hours as needed for wheezing or shortness of breath. 1 Inhaler 5   alfuzosin  (UROXATRAL ) 10 MG 24 hr tablet TAKE ONE TABLET BY MOUTH DAILY WITH BREAKFAST 30 tablet 5   aspirin  EC 81 MG tablet Take 1 tablet (81 mg total) by mouth daily. 21 tablet 0   Atogepant  (QULIPTA ) 30 MG TABS Take 1 tablet (30 mg total) by  mouth daily. 30 tablet 6   Biotin 5000 MCG CAPS Take 25,000 mcg by mouth daily.     budesonide -formoterol  (SYMBICORT ) 80-4.5 MCG/ACT inhaler Inhale 2 puffs into the lungs 2 (two) times daily.     carvedilol  (COREG ) 12.5 MG tablet Take 25 mg by mouth 2 (two) times daily with a meal.     cetirizine (ZYRTEC) 10 MG tablet Take 10 mg by mouth daily.     clobetasol (TEMOVATE) 0.05 % external solution Apply 1 application  topically once a week. Apply to scalp     clopidogrel  (PLAVIX ) 75 MG tablet Take 1 tablet (75 mg total) by mouth daily. 21 tablet 0   ENTRESTO  24-26 MG TAKE ONE TABLET BY MOUTH 2 TIMES A DAY (Patient taking differently: Take 1 tablet by mouth 2 (two) times daily.) 60 tablet 0   famotidine  (PEPCID ) 20 MG tablet Take 20 mg by mouth daily as needed for heartburn or indigestion.     ferrous sulfate  325 (65 FE) MG EC tablet Take 325 mg by mouth daily.     fluticasone  (FLONASE ) 50 MCG/ACT nasal spray Place 2 sprays into both nostrils daily.     furosemide  (LASIX ) 40 MG tablet Take 1 tablet (40 mg total) by mouth 2 (two) times daily. 180 tablet 0   ketoconazole (NIZORAL) 2 % cream Apply 1 application  topically 2 (two) times daily as needed (psoriasis).     levothyroxine  (SYNTHROID ) 175 MCG tablet Take 175 mcg by mouth daily before breakfast.     Multiple Vitamins-Minerals (CENTRAVITES 50 PLUS PO) Take 1 tablet by mouth daily.      ondansetron  (ZOFRAN ) 4 MG tablet TAKE ONE TABLET BY MOUTH DAILY AS NEEDED FOR NAUSEA AND FOR VOMITING 30 tablet 5   potassium chloride  SA (K-DUR) 20 MEQ tablet Take 20 mEq by mouth daily.      rosuvastatin  (CRESTOR ) 20 MG tablet Take 20 mg by mouth daily.     SENNA-TIME 8.6 MG tablet TAKE ONE TABLET BY MOUTH DAILY 30 tablet 0   UBRELVY  50 MG TABS TAKE ONE TABLET BY MOUTH AS NEEDED. CAN REPEAT A DOSE IN TWO HOURS IF HEADACHE PERSISTS. 16 tablet 2   Vitamin D , Ergocalciferol , (DRISDOL ) 1.25 MG (50000 UNIT) CAPS capsule Take 50,000 Units by mouth every 30 (thirty)  days.     No facility-administered medications prior to visit.    PAST MEDICAL HISTORY: Past Medical History:  Diagnosis Date   Anxiety    Arthritis    Asthma    Automatic implantable cardioverter-defibrillator in situ    2013, july   Bell palsy    states has had 3 episodes   Cardiomyopathy 08/2010   Presented with congestive heart failure; EF of 15% and 2012; hypotension on medication precludes optimal dosing   Chest pain 12/28/2017   CHF (congestive heart failure) (HCC)    a. EF 20%  in 2012 with low-risk NST --> St. Jude ICD implantation by Dr. Waddell in 09/2011 b. NST in 12/2017 showing evidence of prior infarct without ischemia c. EF at 45-50% by echo in 06/2019   Chronic systolic heart failure (HCC)    COPD (chronic obstructive pulmonary disease) (HCC)    2013   Dysrhythmia    Gastroesophageal reflux disease    Headache(784.0)    Hot flashes 10/04/2016   Hyperlipidemia    Hypertension    09/2010-normal CMet and CBC; Lipid profile-116, 88, 25, 73   Hypothyroidism    Recent TSH was normal.   ICD (implantable cardiac defibrillator) in place 10/10/2011   Neuromuscular disorder (HCC)    Neuropathy, right foot and leg   Neuropathy    Obesity    Pneumonia    Seizures (HCC)    last one at age 42   Shortness of breath    Stroke (HCC)     stroke in 07/2012 and another in June, 2014   Tobacco abuse    20 pack years    PAST SURGICAL HISTORY: Past Surgical History:  Procedure Laterality Date   CESAREAN SECTION     X2   COLONOSCOPY N/A 12/16/2013   One simple adenoma and 3 hyperplastic polyps. Small internal hemorrhoids. Left colon redundant. Due for surveillance 2020.    COLONOSCOPY WITH PROPOFOL  N/A 06/04/2019   Procedure: COLONOSCOPY WITH PROPOFOL ;  Surgeon: Harvey Margo CROME, MD;  Location: [facility];  Service: Endoscopy;  Laterality: N/A;  10:30am   COLONOSCOPY WITH PROPOFOL  N/A 06/15/2022   Procedure: COLONOSCOPY WITH PROPOFOL ;  Surgeon: Shaaron Lamar HERO, MD;   Location: [facility];  Service: Endoscopy;  Laterality: N/A;  11:45am, asa 3   CYSTOSCOPY W/ URETERAL STENT PLACEMENT Bilateral 03/01/2022   Procedure: CYSTOSCOPY WITH RETROGRADE PYELOGRAM/URETERAL STENT PLACEMENT;  Surgeon: Sherrilee Belvie CROME, MD;  Location: [facility];  Service: Urology;  Laterality: Bilateral;   CYSTOSCOPY WITH RETROGRADE PYELOGRAM, URETEROSCOPY AND STENT PLACEMENT Left 05/05/2022   Procedure: CYSTOSCOPY WITH RETROGRADE PYELOGRAM, URETEROSCOPY AND STENT EXCHANGE;  Surgeon: Sherrilee Belvie CROME, MD;  Location: [facility];  Service: Urology;  Laterality: Left;   CYSTOSCOPY WITH RETROGRADE PYELOGRAM, URETEROSCOPY AND STENT PLACEMENT Right 06/09/2022   Procedure: CYSTOSCOPY WITH RETROGRADE PYELOGRAM, URETEROSCOPY AND STENT EXCHANGE;  Surgeon: Sherrilee Belvie CROME, MD;  Location: [facility];  Service: Urology;  Laterality: Right;   EP IMPLANTABLE DEVICE     St. Jude   EP IMPLANTABLE DEVICE N/A 08/13/2015   Procedure: Loop Recorder Insertion;  Surgeon: Danelle LELON Waddell, MD;  Location: [facility];  Service: Cardiovascular;  Laterality: N/A;   HOLMIUM LASER APPLICATION Left 05/05/2022   Procedure: HOLMIUM LASER APPLICATION;  Surgeon: Sherrilee Belvie CROME, MD;  Location: [facility];  Service: Urology;  Laterality: Left;   HOLMIUM LASER APPLICATION Right 06/09/2022   Procedure: HOLMIUM LASER APPLICATION;  Surgeon: Sherrilee Belvie CROME, MD;  Location: [facility];  Service: Urology;  Laterality: Right;   IMPLANTABLE CARDIOVERTER DEFIBRILLATOR IMPLANT N/A 10/10/2011   Procedure: IMPLANTABLE CARDIOVERTER DEFIBRILLATOR IMPLANT;  Surgeon: Danelle LELON Waddell, MD;  Location: [facility] CATH [facility];  Service: Cardiovascular;  Laterality: N/A;   MULTIPLE EXTRACTIONS WITH ALVEOLOPLASTY N/A 09/24/2012   Procedure: MULTIPLE EXTRACION #2, 4, 6, 7 ,8, 9, 11, 13, 18, 20, 21, 22, 23, 24, 25, 26, 27, 29 WITH ALVEOLOPLASTY, BIOPSY OF PALATE LESION, REMOVA RIGHT LINGUAL TORUS;  Surgeon: Glendia HERO Primrose, DDS;  Location: [facility];  Service: Oral  Surgery;  Laterality: N/A;   POLYPECTOMY  06/04/2019   Procedure: POLYPECTOMY;  Surgeon: Harvey Margo CROME, MD;  [facility]: [facility];  Service: Endoscopy;;   POLYPECTOMY  06/15/2022   Procedure: POLYPECTOMY;  Surgeon: Shaaron Lamar HERO, MD;  [facility]: [facility];  Service: Endoscopy;;   TEE WITHOUT CARDIOVERSION N/A 07/11/2012   Procedure: TRANSESOPHAGEAL ECHOCARDIOGRAM (TEE);  Surgeon: Aleene JINNY Passe, MD;  Location: [facility] ENDOSCOPY;  Service: Cardiovascular;  Laterality: N/A;   TUBAL LIGATION      FAMILY HISTORY: Family History  Problem Relation Age of Onset   Cardiomyopathy Mother        ICD pacemaker-ischmic CM   Heart failure Mother    Hypertension Mother    Diabetes Mother    Thyroid  disease Mother    Cardiomyopathy Father        Deceased   Coronary artery disease Father    Heart failure Father    Heart failure Brother    Hypertension Brother    Endometriosis Sister    Heart disease Paternal Grandfather    Heart disease Paternal Grandmother    Heart disease Maternal Grandmother    Heart disease Maternal Grandfather    Hypertension Daughter    Obesity Daughter    Colon cancer Neg Hx    Liver disease Neg Hx     SOCIAL HISTORY: Social History   Socioeconomic History   Marital status: Divorced    Spouse name: Not on file   Number of children: 2   Years of education: Not on file   Highest education level: Not on file  Occupational History    Employer: [employer]    Comment: Works in Science writer  Tobacco Use   Smoking status: Every Day    Current packs/day: 0.75    Average packs/day: 0.8 packs/day for 40.2 years (30.2 ttl pk-yrs)    Types: Cigarettes    Start date: 07/02/1983   Smokeless tobacco: Never  Vaping Use   Vaping status: Former  Substance and Sexual Activity   Alcohol use: No    Alcohol/week: 0.0 standard drinks of alcohol   Drug use: No   Sexual activity: Not Currently    Birth control/protection: Abstinence, Surgical    Comment: tubal   Other Topics Concern   Not on file  Social History Narrative   Not on file   Social Drivers of Health   Financial Resource Strain: Not on file  Food Insecurity: No Food Insecurity (03/01/2022)   Hunger Vital Sign    Worried About Running Out of Food in the Last Year: Never true    Ran Out of Food in the Last Year: Never true  Transportation Needs: No Transportation Needs (03/01/2022)   PRAPARE - Administrator, Civil Service (Medical): No    Lack of Transportation (Non-Medical): No  Physical Activity: Not on file  Stress: Not on file  Social Connections: Not on file  Intimate Partner Violence: Not At Risk (03/01/2022)   Humiliation, Afraid, Rape, and Kick questionnaire    Fear of Current or Ex-Partner: No    Emotionally Abused: No    Physically Abused: No    Sexually Abused: No    PHYSICAL EXAM  Vitals:   09/19/23 1251  BP: 119/67  Pulse: 65  SpO2: 96%  Height: 5' 7.5 (1.715 m)   Body mass index is 31.76 kg/m.  Generalized: Well developed, in no acute distress  Neurological examination  Mentation: Alert oriented to time, place, history taking. Follows all commands speech and language fluent  Cranial nerve II-XII: Pupils were equal round reactive to light. Extraocular movements were full, visual field were full on confrontational test. Facial sensation and strength were normal. Uvula tongue midline. Head turning and shoulder shrug  were normal and symmetric. Motor: The motor testing reveals 5 over 5 strength of all 4 extremities. Good symmetric motor tone is noted throughout.  Sensory: Sensory testing is intact to soft touch on all 4 extremities. No evidence of extinction is noted.  Coordination: Cerebellar testing reveals good finger-nose-finger and heel-to-shin bilaterally.  Gait and station: Gait is normal. Tandem gait is normal. Romberg is negative. No drift is seen.  Reflexes: Deep tendon reflexes are symmetric and normal bilaterally.   DIAGNOSTIC  DATA (LABS, IMAGING, TESTING) - I reviewed patient records, labs, notes, testing and imaging myself where available.  Lab Results  Component Value Date   WBC 10.7 (H) 06/06/2022   HGB 12.6 06/06/2022   HCT 40.4 06/06/2022   MCV 94.6 06/06/2022   PLT 242 06/06/2022      Component Value Date/Time   NA 141 06/06/2022 0928   K 3.5 06/06/2022 0928   CL 106 06/06/2022 0928   CO2 25 06/06/2022 0928   GLUCOSE 110 (H) 06/06/2022 0928   BUN 17 06/06/2022 0928   CREATININE 0.84 06/06/2022 0928   CREATININE 0.70 02/06/2018 1020   CALCIUM  9.1 06/06/2022 0928   PROT 6.9 02/28/2022 1741   ALBUMIN  3.3 (L) 02/28/2022 1741   AST 20 02/28/2022 1741   ALT 23 02/28/2022 1741   ALKPHOS 90 02/28/2022 1741   BILITOT 0.9 02/28/2022 1741   GFRNONAA >60 06/06/2022 0928   GFRNONAA 86 02/19/2016 1013   GFRAA >60 09/09/2019 2344   GFRAA >89 02/19/2016 1013   Lab Results  Component Value Date   CHOL 152 07/03/2019   HDL 37 (L) 07/03/2019   LDLCALC 77 07/03/2019   TRIG 189 (H) 07/03/2019   CHOLHDL 4.1 07/03/2019   Lab Results  Component Value Date   HGBA1C 5.7 (H) 07/03/2019   Lab Results  Component Value Date   VITAMINB12 328 07/27/2015   Lab Results  Component Value Date   TSH 1.44 09/05/2018    Lauraine Born, AGNP-C, DNP 09/19/2023, 1:11 PM [facility] [address] [place], [place] [address] [phone]

## 2023-09-21 ENCOUNTER — Ambulatory Visit (HOSPITAL_COMMUNITY)
Admission: RE | Admit: 2023-09-21 | Discharge: 2023-09-21 | Disposition: A | Discharge status: Home / Self Care | Source: Ambulatory Visit | Attending: Gerontology | Admitting: Gerontology

## 2023-09-21 DIAGNOSIS — Z87891 Personal history of nicotine dependence: Secondary | ICD-10-CM | POA: Diagnosis not present

## 2023-09-21 DIAGNOSIS — F17210 Nicotine dependence, cigarettes, uncomplicated: Secondary | ICD-10-CM | POA: Diagnosis not present

## 2023-10-04 DIAGNOSIS — I5022 Chronic systolic (congestive) heart failure: Secondary | ICD-10-CM | POA: Diagnosis not present

## 2023-10-04 DIAGNOSIS — I10 Essential (primary) hypertension: Secondary | ICD-10-CM | POA: Diagnosis not present

## 2023-10-13 DIAGNOSIS — I10 Essential (primary) hypertension: Secondary | ICD-10-CM | POA: Diagnosis not present

## 2023-10-13 DIAGNOSIS — J449 Chronic obstructive pulmonary disease, unspecified: Secondary | ICD-10-CM | POA: Diagnosis not present

## 2023-11-04 DIAGNOSIS — I10 Essential (primary) hypertension: Secondary | ICD-10-CM | POA: Diagnosis not present

## 2023-11-04 DIAGNOSIS — I5022 Chronic systolic (congestive) heart failure: Secondary | ICD-10-CM | POA: Diagnosis not present

## 2023-11-13 DIAGNOSIS — J449 Chronic obstructive pulmonary disease, unspecified: Secondary | ICD-10-CM | POA: Diagnosis not present

## 2023-11-13 DIAGNOSIS — I10 Essential (primary) hypertension: Secondary | ICD-10-CM | POA: Diagnosis not present

## 2023-12-05 DIAGNOSIS — I10 Essential (primary) hypertension: Secondary | ICD-10-CM | POA: Diagnosis not present

## 2023-12-05 DIAGNOSIS — I5022 Chronic systolic (congestive) heart failure: Secondary | ICD-10-CM | POA: Diagnosis not present

## 2023-12-13 DIAGNOSIS — J449 Chronic obstructive pulmonary disease, unspecified: Secondary | ICD-10-CM | POA: Diagnosis not present

## 2023-12-13 DIAGNOSIS — I10 Essential (primary) hypertension: Secondary | ICD-10-CM | POA: Diagnosis not present

## 2024-01-02 DIAGNOSIS — Z23 Encounter for immunization: Secondary | ICD-10-CM | POA: Diagnosis not present

## 2024-01-02 DIAGNOSIS — J449 Chronic obstructive pulmonary disease, unspecified: Secondary | ICD-10-CM | POA: Diagnosis not present

## 2024-01-02 DIAGNOSIS — E781 Pure hyperglyceridemia: Secondary | ICD-10-CM | POA: Diagnosis not present

## 2024-01-02 DIAGNOSIS — I5022 Chronic systolic (congestive) heart failure: Secondary | ICD-10-CM | POA: Diagnosis not present

## 2024-01-02 DIAGNOSIS — I10 Essential (primary) hypertension: Secondary | ICD-10-CM | POA: Diagnosis not present

## 2024-01-02 DIAGNOSIS — M545 Low back pain, unspecified: Secondary | ICD-10-CM | POA: Diagnosis not present

## 2024-02-09 ENCOUNTER — Other Ambulatory Visit: Payer: Self-pay | Admitting: Urology

## 2024-02-22 ENCOUNTER — Ambulatory Visit (HOSPITAL_COMMUNITY)
Admission: RE | Admit: 2024-02-22 | Discharge: 2024-02-22 | Disposition: A | Discharge status: Home / Self Care | Source: Ambulatory Visit | Attending: Gerontology | Admitting: Gerontology

## 2024-02-22 ENCOUNTER — Other Ambulatory Visit (HOSPITAL_COMMUNITY): Payer: Self-pay | Admitting: Gerontology

## 2024-02-22 DIAGNOSIS — M545 Low back pain, unspecified: Secondary | ICD-10-CM | POA: Diagnosis present

## 2024-04-05 ENCOUNTER — Ambulatory Visit (HOSPITAL_COMMUNITY)
Admission: RE | Admit: 2024-04-05 | Discharge: 2024-04-05 | Disposition: A | Payer: 59 | Source: Ambulatory Visit | Attending: Urology

## 2024-04-05 DIAGNOSIS — N201 Calculus of ureter: Secondary | ICD-10-CM | POA: Insufficient documentation

## 2024-04-12 ENCOUNTER — Ambulatory Visit: Payer: 59 | Admitting: Urology

## 2024-04-12 DIAGNOSIS — N201 Calculus of ureter: Secondary | ICD-10-CM

## 2024-04-23 ENCOUNTER — Ambulatory Visit: Admitting: Neurology

## 2024-05-22 ENCOUNTER — Ambulatory Visit: Admitting: Urology
